# Patient Record
Sex: Female | Born: 1960
Health system: Southern US, Community
[De-identification: ages and names within clinical notes are randomized; demographics above are authoritative.]

## PROBLEM LIST (undated history)

## (undated) DIAGNOSIS — Z9221 Personal history of antineoplastic chemotherapy: Secondary | ICD-10-CM

## (undated) DIAGNOSIS — F32A Depression, unspecified: Secondary | ICD-10-CM

## (undated) DIAGNOSIS — Z9289 Personal history of other medical treatment: Secondary | ICD-10-CM

## (undated) DIAGNOSIS — G4730 Sleep apnea, unspecified: Secondary | ICD-10-CM

## (undated) DIAGNOSIS — T8859XA Other complications of anesthesia, initial encounter: Secondary | ICD-10-CM

## (undated) DIAGNOSIS — Z923 Personal history of irradiation: Secondary | ICD-10-CM

## (undated) DIAGNOSIS — Z973 Presence of spectacles and contact lenses: Secondary | ICD-10-CM

## (undated) DIAGNOSIS — G709 Myoneural disorder, unspecified: Secondary | ICD-10-CM

## (undated) DIAGNOSIS — E039 Hypothyroidism, unspecified: Secondary | ICD-10-CM

## (undated) DIAGNOSIS — I10 Essential (primary) hypertension: Secondary | ICD-10-CM

## (undated) DIAGNOSIS — Z9889 Other specified postprocedural states: Secondary | ICD-10-CM

## (undated) DIAGNOSIS — R12 Heartburn: Secondary | ICD-10-CM

## (undated) DIAGNOSIS — M1990 Unspecified osteoarthritis, unspecified site: Secondary | ICD-10-CM

## (undated) DIAGNOSIS — D649 Anemia, unspecified: Secondary | ICD-10-CM

## (undated) DIAGNOSIS — C801 Malignant (primary) neoplasm, unspecified: Secondary | ICD-10-CM

## (undated) DIAGNOSIS — F329 Major depressive disorder, single episode, unspecified: Secondary | ICD-10-CM

## (undated) DIAGNOSIS — C50919 Malignant neoplasm of unspecified site of unspecified female breast: Secondary | ICD-10-CM

## (undated) DIAGNOSIS — G35 Multiple sclerosis: Secondary | ICD-10-CM

## (undated) DIAGNOSIS — K589 Irritable bowel syndrome without diarrhea: Secondary | ICD-10-CM

## (undated) DIAGNOSIS — K219 Gastro-esophageal reflux disease without esophagitis: Secondary | ICD-10-CM

## (undated) DIAGNOSIS — F419 Anxiety disorder, unspecified: Secondary | ICD-10-CM

## (undated) HISTORY — PX: ESOPHAGOGASTRODUODENOSCOPY: SHX1529

## (undated) HISTORY — DX: Multiple sclerosis: G35

## (undated) HISTORY — PX: COLONOSCOPY: SHX5424

## (undated) HISTORY — DX: Depression, unspecified: F32.A

## (undated) HISTORY — DX: Personal history of other medical treatment: Z92.89

## (undated) HISTORY — PX: TONSILLECTOMY: SUR1361

## (undated) HISTORY — DX: Heartburn: R12

## (undated) HISTORY — PX: MOHS SURGERY: SUR867

## (undated) HISTORY — PX: TUMOR REMOVAL: SHX12

## (undated) HISTORY — DX: Major depressive disorder, single episode, unspecified: F32.9

## (undated) HISTORY — DX: Malignant neoplasm of unspecified site of unspecified female breast: C50.919

## (undated) HISTORY — PX: MASTECTOMY: SHX3

## (undated) HISTORY — PX: NO PAST SURGERIES: SHX2092

## (undated) HISTORY — DX: Other specified postprocedural states: Z98.890

## (undated) HISTORY — DX: Malignant (primary) neoplasm, unspecified: C80.1

## (undated) HISTORY — DX: Personal history of irradiation: Z92.3

## (undated) HISTORY — PX: FLEXIBLE SIGMOIDOSCOPY: SHX1649

## (undated) HISTORY — DX: Presence of spectacles and contact lenses: Z97.3

## (undated) HISTORY — DX: Irritable bowel syndrome, unspecified: K58.9

---

## 1982-06-20 HISTORY — PX: KNEE ARTHROSCOPY: SUR90

## 1997-10-10 ENCOUNTER — Other Ambulatory Visit: Admission: RE | Admit: 1997-10-10 | Discharge: 1997-10-10 | Payer: Self-pay | Admitting: Obstetrics and Gynecology

## 1998-12-24 ENCOUNTER — Other Ambulatory Visit: Admission: RE | Admit: 1998-12-24 | Discharge: 1998-12-24 | Payer: Self-pay | Admitting: Obstetrics and Gynecology

## 2000-08-18 ENCOUNTER — Other Ambulatory Visit: Admission: RE | Admit: 2000-08-18 | Discharge: 2000-08-18 | Payer: Self-pay | Admitting: Obstetrics and Gynecology

## 2002-03-05 ENCOUNTER — Other Ambulatory Visit: Admission: RE | Admit: 2002-03-05 | Discharge: 2002-03-05 | Payer: Self-pay | Admitting: Obstetrics and Gynecology

## 2002-05-14 ENCOUNTER — Ambulatory Visit (HOSPITAL_COMMUNITY): Admission: RE | Admit: 2002-05-14 | Discharge: 2002-05-14 | Payer: Self-pay | Admitting: Obstetrics and Gynecology

## 2003-04-02 ENCOUNTER — Other Ambulatory Visit: Admission: RE | Admit: 2003-04-02 | Discharge: 2003-04-02 | Payer: Self-pay | Admitting: Obstetrics and Gynecology

## 2003-10-08 ENCOUNTER — Encounter: Admission: RE | Admit: 2003-10-08 | Discharge: 2003-10-08 | Payer: Self-pay | Admitting: Internal Medicine

## 2003-11-07 ENCOUNTER — Ambulatory Visit (HOSPITAL_COMMUNITY): Admission: RE | Admit: 2003-11-07 | Discharge: 2003-11-07 | Payer: Self-pay | Admitting: Internal Medicine

## 2004-07-05 ENCOUNTER — Other Ambulatory Visit: Admission: RE | Admit: 2004-07-05 | Discharge: 2004-07-05 | Payer: Self-pay | Admitting: Obstetrics and Gynecology

## 2004-12-07 ENCOUNTER — Ambulatory Visit: Payer: Self-pay | Admitting: Gastroenterology

## 2005-01-11 DIAGNOSIS — C4491 Basal cell carcinoma of skin, unspecified: Secondary | ICD-10-CM

## 2005-01-11 DIAGNOSIS — C4492 Squamous cell carcinoma of skin, unspecified: Secondary | ICD-10-CM

## 2005-01-11 HISTORY — DX: Basal cell carcinoma of skin, unspecified: C44.91

## 2005-01-11 HISTORY — DX: Squamous cell carcinoma of skin, unspecified: C44.92

## 2005-06-20 LAB — HM COLONOSCOPY

## 2005-09-12 ENCOUNTER — Other Ambulatory Visit: Admission: RE | Admit: 2005-09-12 | Discharge: 2005-09-12 | Payer: Self-pay | Admitting: Obstetrics and Gynecology

## 2006-08-17 ENCOUNTER — Ambulatory Visit (HOSPITAL_COMMUNITY): Admission: RE | Admit: 2006-08-17 | Discharge: 2006-08-17 | Payer: Self-pay | Admitting: Obstetrics and Gynecology

## 2011-03-21 DIAGNOSIS — Z9289 Personal history of other medical treatment: Secondary | ICD-10-CM

## 2011-03-21 HISTORY — DX: Personal history of other medical treatment: Z92.89

## 2011-07-15 ENCOUNTER — Other Ambulatory Visit: Payer: Self-pay | Admitting: Orthopedic Surgery

## 2011-07-22 HISTORY — PX: JOINT REPLACEMENT: SHX530

## 2011-08-09 ENCOUNTER — Encounter (HOSPITAL_COMMUNITY): Payer: Self-pay | Admitting: Respiratory Therapy

## 2011-08-10 ENCOUNTER — Other Ambulatory Visit (HOSPITAL_COMMUNITY): Payer: Self-pay

## 2011-08-11 ENCOUNTER — Encounter (HOSPITAL_COMMUNITY)
Admission: RE | Admit: 2011-08-11 | Discharge: 2011-08-11 | Disposition: A | Discharge status: Home / Self Care | Payer: BC Managed Care – PPO | Source: Ambulatory Visit | Attending: Orthopedic Surgery | Admitting: Orthopedic Surgery

## 2011-08-11 ENCOUNTER — Encounter (HOSPITAL_COMMUNITY): Payer: Self-pay

## 2011-08-11 ENCOUNTER — Other Ambulatory Visit: Payer: Self-pay

## 2011-08-11 ENCOUNTER — Ambulatory Visit (HOSPITAL_COMMUNITY)
Admission: RE | Admit: 2011-08-11 | Discharge: 2011-08-11 | Disposition: A | Discharge status: Home / Self Care | Payer: BC Managed Care – PPO | Source: Ambulatory Visit | Attending: Orthopedic Surgery | Admitting: Orthopedic Surgery

## 2011-08-11 DIAGNOSIS — Z01818 Encounter for other preprocedural examination: Secondary | ICD-10-CM | POA: Insufficient documentation

## 2011-08-11 DIAGNOSIS — Z01812 Encounter for preprocedural laboratory examination: Secondary | ICD-10-CM | POA: Insufficient documentation

## 2011-08-11 HISTORY — DX: Myoneural disorder, unspecified: G70.9

## 2011-08-11 HISTORY — DX: Sleep apnea, unspecified: G47.30

## 2011-08-11 HISTORY — DX: Hypothyroidism, unspecified: E03.9

## 2011-08-11 HISTORY — DX: Unspecified osteoarthritis, unspecified site: M19.90

## 2011-08-11 HISTORY — DX: Essential (primary) hypertension: I10

## 2011-08-11 LAB — APTT: aPTT: 32 seconds (ref 24–37)

## 2011-08-11 LAB — COMPREHENSIVE METABOLIC PANEL
ALT: 19 U/L (ref 0–35)
AST: 20 U/L (ref 0–37)
Albumin: 3.7 g/dL (ref 3.5–5.2)
Alkaline Phosphatase: 146 U/L — ABNORMAL HIGH (ref 39–117)
BUN: 13 mg/dL (ref 6–23)
CO2: 28 mEq/L (ref 19–32)
Calcium: 9.6 mg/dL (ref 8.4–10.5)
Chloride: 99 mEq/L (ref 96–112)
Creatinine, Ser: 0.94 mg/dL (ref 0.50–1.10)
GFR calc Af Amer: 79 mL/min — ABNORMAL LOW (ref >90)
GFR calc non Af Amer: 69 mL/min — ABNORMAL LOW (ref >90)
Glucose, Bld: 105 mg/dL — ABNORMAL HIGH (ref 70–99)
Potassium: 3 mEq/L — ABNORMAL LOW (ref 3.5–5.1)
Sodium: 138 mEq/L (ref 135–145)
Total Bilirubin: 0.6 mg/dL (ref 0.3–1.2)
Total Protein: 7.3 g/dL (ref 6.0–8.3)

## 2011-08-11 LAB — URINALYSIS, ROUTINE W REFLEX MICROSCOPIC
Bilirubin Urine: NEGATIVE
Glucose, UA: NEGATIVE mg/dL
Hgb urine dipstick: NEGATIVE
Ketones, ur: NEGATIVE mg/dL
Leukocytes, UA: NEGATIVE
Nitrite: NEGATIVE
Protein, ur: NEGATIVE mg/dL
Specific Gravity, Urine: 1.007 (ref 1.005–1.030)
Urobilinogen, UA: 0.2 mg/dL (ref 0.0–1.0)
pH: 6 (ref 5.0–8.0)

## 2011-08-11 LAB — CBC
HCT: 40.7 % (ref 36.0–46.0)
Hemoglobin: 13.7 g/dL (ref 12.0–15.0)
MCH: 28.1 pg (ref 26.0–34.0)
MCHC: 33.7 g/dL (ref 30.0–36.0)
MCV: 83.4 fL (ref 78.0–100.0)
Platelets: 265 10*3/uL (ref 150–400)
RBC: 4.88 MIL/uL (ref 3.87–5.11)
RDW: 13.4 % (ref 11.5–15.5)
WBC: 8.4 10*3/uL (ref 4.0–10.5)

## 2011-08-11 LAB — DIFFERENTIAL
Basophils Absolute: 0 10*3/uL (ref 0.0–0.1)
Basophils Relative: 1 % (ref 0–1)
Eosinophils Absolute: 0.2 10*3/uL (ref 0.0–0.7)
Eosinophils Relative: 3 % (ref 0–5)
Lymphocytes Relative: 28 % (ref 12–46)
Lymphs Abs: 2.3 10*3/uL (ref 0.7–4.0)
Monocytes Absolute: 0.6 10*3/uL (ref 0.1–1.0)
Monocytes Relative: 7 % (ref 3–12)
Neutro Abs: 5.2 10*3/uL (ref 1.7–7.7)
Neutrophils Relative %: 62 % (ref 43–77)

## 2011-08-11 LAB — TYPE AND SCREEN
ABO/RH(D): O POS
Antibody Screen: NEGATIVE

## 2011-08-11 LAB — PROTIME-INR
INR: 0.91 (ref 0.00–1.49)
Prothrombin Time: 12.5 seconds (ref 11.6–15.2)

## 2011-08-11 LAB — ABO/RH: ABO/RH(D): O POS

## 2011-08-11 LAB — SURGICAL PCR SCREEN
MRSA, PCR: NEGATIVE
Staphylococcus aureus: POSITIVE — AB

## 2011-08-11 LAB — HCG, SERUM, QUALITATIVE: Preg, Serum: NEGATIVE

## 2011-08-11 NOTE — Progress Notes (Signed)
REQUESTED STRESS TEST DONE 1.5 YRS AGO  AND SLEEP STUDY 6 YRS AGO FROM [facility] (PATIENT DOES NOT HAVE CPAP).

## 2011-08-11 NOTE — Pre-Procedure Instructions (Signed)
20 Julie Ross  08/11/2011   Your procedure is scheduled on:  Monday 08/15/11   Report to [name] at 1000 AM.  Call this number if you have problems the morning of surgery: [phone]   Remember:   Do not eat food:After Midnight.  May have clear liquids: up to 4 Hours before arrival.  Clear liquids include soda, tea, black coffee, apple or grape juice, broth.  Take these medicines the morning of surgery with A SIP OF WATER: CYMBALTA, NEXIUM, NEURONTIN, SYNTHROID, ULTRAM   Do not wear jewelry, make-up or nail polish.  Do not wear lotions, powders, or perfumes. You may wear deodorant.  Do not shave 48 hours prior to surgery.  Do not bring valuables to the hospital.  Contacts, dentures or bridgework may not be worn into surgery.  Leave suitcase in the car. After surgery it may be brought to your room.  For patients admitted to the hospital, checkout time is 11:00 AM the day of discharge.   Patients discharged the day of surgery will not be allowed to drive home.  Name and phone number of your driver:   Special Instructions: CHG Shower Use Special Wash: 1/2 bottle night before surgery and 1/2 bottle morning of surgery.   Please read over the following fact sheets that you were given: Pain Booklet, Total Joint Packet, MRSA Information and Surgical Site Infection Prevention

## 2011-08-12 ENCOUNTER — Encounter (HOSPITAL_COMMUNITY): Payer: Self-pay | Admitting: Vascular Surgery

## 2011-08-12 NOTE — Consult Note (Signed)
Anesthesia:  Patient is a 51 year old female scheduled for a left TKA on 08/15/11.  History includes HTN, MS, hypothyroidism, OSA not on CPAP, non-smoker.  EKG shows NSR with non-specific ST abnormality.  She had a negative Lexiscan stress test, EF 81% on 11/26/09 at [facility].  She also had an echocardiogram done then showing structurally normal MV with trace insufficiency, no prolapse, structurally normal AV with good leaflet mobility and no regurgitation, normal LA, normal LV size and systolic function with EF 65% with no segmental abnormalities, no pericardial effusion.  CXR shows no active disease.   Labs noted.  K+ 3.0--acceptable per Anesthesia guidelines.  Plan to proceed.

## 2011-08-14 MED ORDER — CEFAZOLIN SODIUM-DEXTROSE 2-3 GM-% IV SOLR
2.0000 g | INTRAVENOUS | Status: AC
  Administered 2011-08-15: 2 g via INTRAVENOUS
  Filled 2011-08-14: qty 50

## 2011-08-15 ENCOUNTER — Encounter (HOSPITAL_COMMUNITY): Payer: Self-pay | Admitting: Vascular Surgery

## 2011-08-15 ENCOUNTER — Ambulatory Visit (HOSPITAL_COMMUNITY): Payer: BC Managed Care – PPO | Admitting: Vascular Surgery

## 2011-08-15 ENCOUNTER — Encounter (HOSPITAL_COMMUNITY)
Admission: RE | Disposition: A | Discharge status: Home Health | Payer: Self-pay | Source: Ambulatory Visit | Attending: Orthopedic Surgery

## 2011-08-15 ENCOUNTER — Inpatient Hospital Stay (HOSPITAL_COMMUNITY)
Admission: RE | Admit: 2011-08-15 | Discharge: 2011-08-18 | DRG: 209 | Disposition: A | Discharge status: Home Health | Payer: BC Managed Care – PPO | Source: Ambulatory Visit | Attending: Orthopedic Surgery | Admitting: Orthopedic Surgery

## 2011-08-15 ENCOUNTER — Encounter (HOSPITAL_COMMUNITY): Payer: Self-pay | Admitting: *Deleted

## 2011-08-15 DIAGNOSIS — G35 Multiple sclerosis: Secondary | ICD-10-CM | POA: Diagnosis present

## 2011-08-15 DIAGNOSIS — E039 Hypothyroidism, unspecified: Secondary | ICD-10-CM | POA: Diagnosis present

## 2011-08-15 DIAGNOSIS — M129 Arthropathy, unspecified: Secondary | ICD-10-CM | POA: Diagnosis present

## 2011-08-15 DIAGNOSIS — I10 Essential (primary) hypertension: Secondary | ICD-10-CM | POA: Diagnosis present

## 2011-08-15 DIAGNOSIS — M1710 Unilateral primary osteoarthritis, unspecified knee: Principal | ICD-10-CM | POA: Diagnosis present

## 2011-08-15 DIAGNOSIS — G4730 Sleep apnea, unspecified: Secondary | ICD-10-CM | POA: Diagnosis present

## 2011-08-15 DIAGNOSIS — M1712 Unilateral primary osteoarthritis, left knee: Secondary | ICD-10-CM | POA: Diagnosis present

## 2011-08-15 DIAGNOSIS — Z79899 Other long term (current) drug therapy: Secondary | ICD-10-CM

## 2011-08-15 DIAGNOSIS — M170 Bilateral primary osteoarthritis of knee: Secondary | ICD-10-CM | POA: Diagnosis present

## 2011-08-15 HISTORY — PX: TOTAL KNEE ARTHROPLASTY: SHX125

## 2011-08-15 SURGERY — ARTHROPLASTY, KNEE, TOTAL
Anesthesia: Regional | Site: Knee | Laterality: Left | Wound class: Clean

## 2011-08-15 MED ORDER — BUPIVACAINE HCL (PF) 0.5 % IJ SOLN
INTRAMUSCULAR | Status: DC | PRN
  Administered 2011-08-15: 8 mL via INTRA_ARTICULAR

## 2011-08-15 MED ORDER — NEOSTIGMINE METHYLSULFATE 1 MG/ML IJ SOLN
INTRAMUSCULAR | Status: DC | PRN
  Administered 2011-08-15: 4 mg via INTRAVENOUS

## 2011-08-15 MED ORDER — NALOXONE HCL 0.4 MG/ML IJ SOLN: 0.4000 mg | INTRAMUSCULAR | Status: DC | PRN

## 2011-08-15 MED ORDER — ONDANSETRON HCL 4 MG/2ML IJ SOLN
4.0000 mg | Freq: Four times a day (QID) | INTRAMUSCULAR | Status: DC | PRN
  Administered 2011-08-16 (×2): 4 mg via INTRAVENOUS
  Filled 2011-08-15: qty 2

## 2011-08-15 MED ORDER — WARFARIN VIDEO
Freq: Once | Status: AC
  Administered 2011-08-16: 12:00:00

## 2011-08-15 MED ORDER — KETOROLAC TROMETHAMINE 30 MG/ML IJ SOLN
30.0000 mg | Freq: Three times a day (TID) | INTRAMUSCULAR | Status: AC
  Administered 2011-08-15 – 2011-08-18 (×8): 30 mg via INTRAVENOUS
  Filled 2011-08-15 (×8): qty 1

## 2011-08-15 MED ORDER — ACETAMINOPHEN 10 MG/ML IV SOLN
1000.0000 mg | Freq: Four times a day (QID) | INTRAVENOUS | Status: AC
  Administered 2011-08-15 – 2011-08-16 (×3): 1000 mg via INTRAVENOUS
  Filled 2011-08-15 (×3): qty 100

## 2011-08-15 MED ORDER — DIPHENHYDRAMINE HCL 12.5 MG/5ML PO ELIX: 12.5000 mg | ORAL_SOLUTION | Freq: Four times a day (QID) | ORAL | Status: DC | PRN

## 2011-08-15 MED ORDER — PHENYLEPHRINE HCL 10 MG/ML IJ SOLN
INTRAMUSCULAR | Status: DC | PRN
  Administered 2011-08-15 (×5): 40 ug via INTRAVENOUS
  Administered 2011-08-15: 80 ug via INTRAVENOUS

## 2011-08-15 MED ORDER — MIDAZOLAM HCL 2 MG/2ML IJ SOLN
INTRAMUSCULAR | Status: AC
  Filled 2011-08-15: qty 2

## 2011-08-15 MED ORDER — ONDANSETRON HCL 4 MG/2ML IJ SOLN: 4.0000 mg | Freq: Once | INTRAMUSCULAR | Status: DC | PRN

## 2011-08-15 MED ORDER — ROCURONIUM BROMIDE 100 MG/10ML IV SOLN
INTRAVENOUS | Status: DC | PRN
  Administered 2011-08-15: 50 mg via INTRAVENOUS

## 2011-08-15 MED ORDER — MIDAZOLAM HCL 2 MG/2ML IJ SOLN
1.0000 mg | INTRAMUSCULAR | Status: DC | PRN
  Administered 2011-08-15: 2 mg via INTRAVENOUS

## 2011-08-15 MED ORDER — LOSARTAN POTASSIUM-HCTZ 50-12.5 MG PO TABS: 1.0000 | ORAL_TABLET | Freq: Every day | ORAL | Status: DC

## 2011-08-15 MED ORDER — METHOCARBAMOL 500 MG PO TABS
500.0000 mg | ORAL_TABLET | Freq: Four times a day (QID) | ORAL | Status: DC | PRN
  Administered 2011-08-16 (×2): 500 mg via ORAL
  Filled 2011-08-15 (×3): qty 1

## 2011-08-15 MED ORDER — ESMOLOL HCL 10 MG/ML IV SOLN
INTRAVENOUS | Status: DC | PRN
  Administered 2011-08-15: 20 mg via INTRAVENOUS

## 2011-08-15 MED ORDER — KETOROLAC TROMETHAMINE 30 MG/ML IJ SOLN
INTRAMUSCULAR | Status: DC | PRN
  Administered 2011-08-15: 30 mg via INTRAVENOUS

## 2011-08-15 MED ORDER — FENTANYL CITRATE 0.05 MG/ML IJ SOLN
INTRAMUSCULAR | Status: DC | PRN
  Administered 2011-08-15: 50 ug via INTRAVENOUS
  Administered 2011-08-15: 25 ug via INTRAVENOUS
  Administered 2011-08-15 (×2): 50 ug via INTRAVENOUS
  Administered 2011-08-15: 25 ug via INTRAVENOUS
  Administered 2011-08-15: 50 ug via INTRAVENOUS
  Administered 2011-08-15: 100 ug via INTRAVENOUS

## 2011-08-15 MED ORDER — LEVOTHYROXINE SODIUM 25 MCG PO TABS
25.0000 ug | ORAL_TABLET | Freq: Every day | ORAL | Status: DC
  Administered 2011-08-16 – 2011-08-18 (×3): 25 ug via ORAL
  Filled 2011-08-15 (×3): qty 1

## 2011-08-15 MED ORDER — METHOCARBAMOL 100 MG/ML IJ SOLN
500.0000 mg | Freq: Four times a day (QID) | INTRAVENOUS | Status: DC | PRN
  Administered 2011-08-15: 500 mg via INTRAVENOUS
  Filled 2011-08-15: qty 5

## 2011-08-15 MED ORDER — ZOLPIDEM TARTRATE 5 MG PO TABS: 5.0000 mg | ORAL_TABLET | Freq: Every evening | ORAL | Status: DC | PRN

## 2011-08-15 MED ORDER — PANTOPRAZOLE SODIUM 40 MG PO TBEC
80.0000 mg | DELAYED_RELEASE_TABLET | Freq: Every day | ORAL | Status: DC
  Administered 2011-08-15 – 2011-08-18 (×4): 80 mg via ORAL
  Filled 2011-08-15 (×2): qty 2
  Filled 2011-08-15 (×2): qty 1
  Filled 2011-08-15: qty 2

## 2011-08-15 MED ORDER — PROPOFOL 10 MG/ML IV EMUL
INTRAVENOUS | Status: DC | PRN
  Administered 2011-08-15: 120 mg via INTRAVENOUS

## 2011-08-15 MED ORDER — OXYCODONE HCL 5 MG PO TABS: 5.0000 mg | ORAL_TABLET | ORAL | Status: DC | PRN

## 2011-08-15 MED ORDER — ONDANSETRON HCL 4 MG/2ML IJ SOLN
4.0000 mg | Freq: Four times a day (QID) | INTRAMUSCULAR | Status: DC | PRN
  Filled 2011-08-15: qty 2

## 2011-08-15 MED ORDER — ALUM & MAG HYDROXIDE-SIMETH 200-200-20 MG/5ML PO SUSP
30.0000 mL | ORAL | Status: DC | PRN
  Administered 2011-08-17: 30 mL via ORAL
  Filled 2011-08-15: qty 30

## 2011-08-15 MED ORDER — LACTATED RINGERS IV SOLN
INTRAVENOUS | Status: DC | PRN
  Administered 2011-08-15 (×3): via INTRAVENOUS

## 2011-08-15 MED ORDER — DEXTROSE-NACL 5-0.45 % IV SOLN
INTRAVENOUS | Status: DC
  Administered 2011-08-15 – 2011-08-16 (×2): via INTRAVENOUS

## 2011-08-15 MED ORDER — GABAPENTIN 300 MG PO CAPS
300.0000 mg | ORAL_CAPSULE | Freq: Three times a day (TID) | ORAL | Status: DC
  Administered 2011-08-15 – 2011-08-18 (×9): 300 mg via ORAL
  Filled 2011-08-15 (×11): qty 1

## 2011-08-15 MED ORDER — DULOXETINE HCL 60 MG PO CPEP
60.0000 mg | ORAL_CAPSULE | Freq: Every day | ORAL | Status: DC
  Administered 2011-08-16 – 2011-08-18 (×3): 60 mg via ORAL
  Filled 2011-08-15 (×3): qty 1

## 2011-08-15 MED ORDER — PROMETHAZINE HCL 25 MG/ML IJ SOLN: 12.5000 mg | Freq: Four times a day (QID) | INTRAMUSCULAR | Status: DC | PRN

## 2011-08-15 MED ORDER — ONDANSETRON HCL 4 MG PO TABS
4.0000 mg | ORAL_TABLET | Freq: Four times a day (QID) | ORAL | Status: DC | PRN
  Administered 2011-08-18: 4 mg via ORAL
  Filled 2011-08-15: qty 1

## 2011-08-15 MED ORDER — ONDANSETRON HCL 4 MG/2ML IJ SOLN
INTRAMUSCULAR | Status: DC | PRN
  Administered 2011-08-15: 4 mg via INTRAVENOUS

## 2011-08-15 MED ORDER — HYDROCHLOROTHIAZIDE 12.5 MG PO CAPS
12.5000 mg | ORAL_CAPSULE | Freq: Every day | ORAL | Status: DC
  Administered 2011-08-15 – 2011-08-18 (×4): 12.5 mg via ORAL
  Filled 2011-08-15 (×4): qty 1

## 2011-08-15 MED ORDER — METHYLPREDNISOLONE ACETATE PF 80 MG/ML IJ SUSP
INTRAMUSCULAR | Status: DC | PRN
  Administered 2011-08-15: 2 mL via INTRA_ARTICULAR

## 2011-08-15 MED ORDER — FENTANYL CITRATE 0.05 MG/ML IJ SOLN
INTRAMUSCULAR | Status: AC
  Filled 2011-08-15: qty 2

## 2011-08-15 MED ORDER — FERROUS SULFATE 325 (65 FE) MG PO TABS
325.0000 mg | ORAL_TABLET | Freq: Two times a day (BID) | ORAL | Status: DC
  Administered 2011-08-15 – 2011-08-18 (×6): 325 mg via ORAL
  Filled 2011-08-15 (×8): qty 1

## 2011-08-15 MED ORDER — HYDROMORPHONE HCL PF 1 MG/ML IJ SOLN
0.2500 mg | INTRAMUSCULAR | Status: DC | PRN
  Administered 2011-08-15 (×3): 0.5 mg via INTRAVENOUS

## 2011-08-15 MED ORDER — HYDROMORPHONE HCL PF 1 MG/ML IJ SOLN
INTRAMUSCULAR | Status: AC
  Filled 2011-08-15: qty 1

## 2011-08-15 MED ORDER — MORPHINE SULFATE (PF) 1 MG/ML IV SOLN
INTRAVENOUS | Status: AC
  Administered 2011-08-15: 22:00:00
  Filled 2011-08-15: qty 25

## 2011-08-15 MED ORDER — SODIUM CHLORIDE 0.9 % IR SOLN
Status: DC | PRN
  Administered 2011-08-15: 1000 mL

## 2011-08-15 MED ORDER — BACLOFEN 10 MG PO TABS
10.0000 mg | ORAL_TABLET | Freq: Three times a day (TID) | ORAL | Status: DC
  Administered 2011-08-15 – 2011-08-18 (×9): 10 mg via ORAL
  Filled 2011-08-15 (×11): qty 1

## 2011-08-15 MED ORDER — GLYCOPYRROLATE 0.2 MG/ML IJ SOLN
INTRAMUSCULAR | Status: DC | PRN
  Administered 2011-08-15: .6 mg via INTRAVENOUS

## 2011-08-15 MED ORDER — COUMADIN BOOK
Freq: Once | Status: AC
  Administered 2011-08-15: 18:00:00
  Filled 2011-08-15: qty 1

## 2011-08-15 MED ORDER — POVIDONE-IODINE 7.5 % EX SOLN
Freq: Once | CUTANEOUS | Status: DC
  Filled 2011-08-15: qty 118

## 2011-08-15 MED ORDER — CEFAZOLIN SODIUM-DEXTROSE 2-3 GM-% IV SOLR
2.0000 g | Freq: Four times a day (QID) | INTRAVENOUS | Status: AC
  Administered 2011-08-15 – 2011-08-16 (×3): 2 g via INTRAVENOUS
  Filled 2011-08-15 (×3): qty 50

## 2011-08-15 MED ORDER — MORPHINE SULFATE 2 MG/ML IJ SOLN: 0.0500 mg/kg | INTRAMUSCULAR | Status: DC | PRN

## 2011-08-15 MED ORDER — CLONAZEPAM 0.5 MG PO TABS: 0.5000 mg | ORAL_TABLET | Freq: Every evening | ORAL | Status: DC | PRN

## 2011-08-15 MED ORDER — DIPHENHYDRAMINE HCL 50 MG/ML IJ SOLN: 12.5000 mg | Freq: Four times a day (QID) | INTRAMUSCULAR | Status: DC | PRN

## 2011-08-15 MED ORDER — FENTANYL CITRATE 0.05 MG/ML IJ SOLN
50.0000 ug | INTRAMUSCULAR | Status: DC | PRN
  Administered 2011-08-15: 100 ug via INTRAVENOUS

## 2011-08-15 MED ORDER — MEPERIDINE HCL 25 MG/ML IJ SOLN: 6.2500 mg | INTRAMUSCULAR | Status: DC | PRN

## 2011-08-15 MED ORDER — SODIUM CHLORIDE 0.9 % IJ SOLN: 9.0000 mL | INTRAMUSCULAR | Status: DC | PRN

## 2011-08-15 MED ORDER — MORPHINE SULFATE (PF) 1 MG/ML IV SOLN
INTRAVENOUS | Status: DC
  Administered 2011-08-15: 18 mg via INTRAVENOUS
  Administered 2011-08-15: 16:00:00 via INTRAVENOUS
  Administered 2011-08-16: 7.38 mg via INTRAVENOUS
  Administered 2011-08-16: 16.5 mg via INTRAVENOUS
  Administered 2011-08-16: 21:00:00 via INTRAVENOUS
  Administered 2011-08-16: 12 mg via INTRAVENOUS
  Administered 2011-08-16: 12:00:00 via INTRAVENOUS
  Administered 2011-08-16: 22.13 mg via INTRAVENOUS
  Administered 2011-08-17: 4.5 mg via INTRAVENOUS

## 2011-08-15 MED ORDER — LACTATED RINGERS IV SOLN
INTRAVENOUS | Status: DC
  Administered 2011-08-15: 12:00:00 via INTRAVENOUS

## 2011-08-15 MED ORDER — LOSARTAN POTASSIUM 50 MG PO TABS
50.0000 mg | ORAL_TABLET | Freq: Every day | ORAL | Status: DC
  Administered 2011-08-15 – 2011-08-18 (×4): 50 mg via ORAL
  Filled 2011-08-15 (×4): qty 1

## 2011-08-15 MED ORDER — DEXAMETHASONE SODIUM PHOSPHATE 4 MG/ML IJ SOLN
INTRAMUSCULAR | Status: DC | PRN
  Administered 2011-08-15: 4 mg via INTRAVENOUS

## 2011-08-15 MED ORDER — MORPHINE SULFATE (PF) 1 MG/ML IV SOLN
INTRAVENOUS | Status: AC
  Filled 2011-08-15: qty 25

## 2011-08-15 MED ORDER — WARFARIN SODIUM 7.5 MG PO TABS
7.5000 mg | ORAL_TABLET | Freq: Once | ORAL | Status: AC
  Administered 2011-08-15: 7.5 mg via ORAL
  Filled 2011-08-15: qty 1

## 2011-08-15 MED ORDER — ACETAMINOPHEN 10 MG/ML IV SOLN
INTRAVENOUS | Status: DC | PRN
  Administered 2011-08-15: 1000 mg via INTRAVENOUS

## 2011-08-15 SURGICAL SUPPLY — 71 items
APL SKNCLS STERI-STRIP NONHPOA (GAUZE/BANDAGES/DRESSINGS)
BANDAGE ESMARK 6X9 LF (GAUZE/BANDAGES/DRESSINGS) ×1 IMPLANT
BENZOIN TINCTURE PRP APPL 2/3 (GAUZE/BANDAGES/DRESSINGS) ×1 IMPLANT
BLADE SAGITTAL 25.0X1.19X90 (BLADE) ×2 IMPLANT
BLADE SAW SAG 90X13X1.27 (BLADE) ×2 IMPLANT
BNDG CMPR 9X6 STRL LF SNTH (GAUZE/BANDAGES/DRESSINGS) ×1
BNDG ESMARK 6X9 LF (GAUZE/BANDAGES/DRESSINGS) ×2
BOWL SMART MIX CTS (DISPOSABLE) ×2 IMPLANT
CEMENT HV SMART SET (Cement) ×4 IMPLANT
CLOSURE STERI STRIP 1/2 X4 (GAUZE/BANDAGES/DRESSINGS) ×1 IMPLANT
CLOTH BEACON ORANGE TIMEOUT ST (SAFETY) ×2 IMPLANT
COVER BACK TABLE 24X17X13 BIG (DRAPES) IMPLANT
COVER SURGICAL LIGHT HANDLE (MISCELLANEOUS) ×2 IMPLANT
CUFF TOURNIQUET SINGLE 34IN LL (TOURNIQUET CUFF) ×2 IMPLANT
CUFF TOURNIQUET SINGLE 44IN (TOURNIQUET CUFF) IMPLANT
DRAPE EXTREMITY T 121X128X90 (DRAPE) ×2 IMPLANT
DRAPE U-SHAPE 47X51 STRL (DRAPES) ×2 IMPLANT
DRSG MEPILEX BORDER 4X12 (GAUZE/BANDAGES/DRESSINGS) ×1 IMPLANT
DRSG PAD ABDOMINAL 8X10 ST (GAUZE/BANDAGES/DRESSINGS) ×1 IMPLANT
DURAPREP 26ML APPLICATOR (WOUND CARE) ×2 IMPLANT
ELECT REM PT RETURN 9FT ADLT (ELECTROSURGICAL) ×2
ELECTRODE REM PT RTRN 9FT ADLT (ELECTROSURGICAL) ×1 IMPLANT
EVACUATOR 1/8 PVC DRAIN (DRAIN) ×2 IMPLANT
FACESHIELD LNG OPTICON STERILE (SAFETY) ×2 IMPLANT
GAUZE XEROFORM 5X9 LF (GAUZE/BANDAGES/DRESSINGS) ×1 IMPLANT
GLOVE BIOGEL PI IND STRL 8 (GLOVE) ×2 IMPLANT
GLOVE BIOGEL PI INDICATOR 8 (GLOVE) ×3
GLOVE ECLIPSE 7.5 STRL STRAW (GLOVE) ×4 IMPLANT
GLOVE SURG SS PI 7.5 STRL IVOR (GLOVE) ×1 IMPLANT
GOWN BRE IMP PREV XXLGXLNG (GOWN DISPOSABLE) ×1 IMPLANT
GOWN PREVENTION PLUS XLARGE (GOWN DISPOSABLE) ×2 IMPLANT
GOWN SRG XL XLNG 56XLVL 4 (GOWN DISPOSABLE) ×1 IMPLANT
GOWN STRL NON-REIN LRG LVL3 (GOWN DISPOSABLE) ×2 IMPLANT
GOWN STRL NON-REIN XL XLG LVL4 (GOWN DISPOSABLE) ×2
GOWN STRL REIN XL XLG (GOWN DISPOSABLE) ×1 IMPLANT
HANDPIECE INTERPULSE COAX TIP (DISPOSABLE) ×2
HOOD PEEL AWAY FACE SHEILD DIS (HOOD) ×6 IMPLANT
IMMOBILIZER KNEE 20 (SOFTGOODS)
IMMOBILIZER KNEE 20 THIGH 36 (SOFTGOODS) IMPLANT
IMMOBILIZER KNEE 22 UNIV (SOFTGOODS) ×2 IMPLANT
IMMOBILIZER KNEE 24 THIGH 36 (MISCELLANEOUS) IMPLANT
IMMOBILIZER KNEE 24 UNIV (MISCELLANEOUS)
KIT BASIN OR (CUSTOM PROCEDURE TRAY) ×2 IMPLANT
KIT ROOM TURNOVER OR (KITS) ×2 IMPLANT
MANIFOLD NEPTUNE II (INSTRUMENTS) ×2 IMPLANT
MARKER SPHERE PSV REFLC NDI (MISCELLANEOUS) ×3 IMPLANT
NDL 18GX1X1/2 (RX/OR ONLY) (NEEDLE) IMPLANT
NDL HYPO 25GX1X1/2 BEV (NEEDLE) IMPLANT
NEEDLE 18GX1X1/2 (RX/OR ONLY) (NEEDLE) ×2 IMPLANT
NEEDLE HYPO 25GX1X1/2 BEV (NEEDLE) ×2 IMPLANT
NS IRRIG 1000ML POUR BTL (IV SOLUTION) ×2 IMPLANT
PACK TOTAL JOINT (CUSTOM PROCEDURE TRAY) ×2 IMPLANT
PAD ARMBOARD 7.5X6 YLW CONV (MISCELLANEOUS) ×4 IMPLANT
PAD CAST 4YDX4 CTTN HI CHSV (CAST SUPPLIES) ×1 IMPLANT
PADDING CAST COTTON 4X4 STRL (CAST SUPPLIES)
SET HNDPC FAN SPRY TIP SCT (DISPOSABLE) ×1 IMPLANT
SPONGE GAUZE 4X4 12PLY (GAUZE/BANDAGES/DRESSINGS) ×1 IMPLANT
STAPLER VISISTAT 35W (STAPLE) ×1 IMPLANT
STRIP CLOSURE SKIN 1/2X4 (GAUZE/BANDAGES/DRESSINGS) ×1 IMPLANT
SUCTION FRAZIER TIP 10 FR DISP (SUCTIONS) ×2 IMPLANT
SUT MON AB 3-0 SH 27 (SUTURE)
SUT MON AB 3-0 SH27 (SUTURE) IMPLANT
SUT VIC AB 0 CTB1 27 (SUTURE) ×4 IMPLANT
SUT VIC AB 1 CT1 27 (SUTURE) ×4
SUT VIC AB 1 CT1 27XBRD ANBCTR (SUTURE) ×2 IMPLANT
SUT VIC AB 2-0 CTB1 (SUTURE) ×4 IMPLANT
SYR CONTROL 10ML LL (SYRINGE) ×1 IMPLANT
TOWEL OR 17X24 6PK STRL BLUE (TOWEL DISPOSABLE) ×2 IMPLANT
TOWEL OR 17X26 10 PK STRL BLUE (TOWEL DISPOSABLE) ×2 IMPLANT
TRAY FOLEY CATH 14FR (SET/KITS/TRAYS/PACK) ×2 IMPLANT
WATER STERILE IRR 1000ML POUR (IV SOLUTION) ×5 IMPLANT

## 2011-08-15 NOTE — Progress Notes (Signed)
Orthopedic Tech Progress Note Patient Details:  Julie Ross 1961/01/05 [id number]  Patient ID: Julie Ross, female   DOB: [birth year], 51 y.o.   MRN: [id number]   Julie Ross 08/15/2011, 7:10 PM cpm o-60 on at 1910,trapeze bar

## 2011-08-15 NOTE — Consult Note (Signed)
ANTICOAGULATION CONSULT NOTE - Initial Consult  Pharmacy Consult for Coumadin Indication: VTE prophylaxis  No Known Allergies  Patient Measurements: 93.3kg 168cm  Vital Signs: Temp: 97.6 F (36.4 C) (02/25 1630) Temp src: Oral (02/25 1029) BP: 140/63 mmHg (02/25 1630) Pulse Rate: 74  (02/25 1645)  Labs: No results found for this basename: HGB:2,HCT:3,PLT:3,APTT:3,LABPROT:3,INR:3,HEPARINUNFRC:3,CREATININE:3,CKTOTAL:3,CKMB:3,TROPONINI:3 in the last 72 hours CrCl is unknown because there is no height on file for the current visit.  Medical History: Past Medical History  Diagnosis Date  . Hypertension   . Sleep apnea     MILD SLEEP APNEA, NO MACHINE  6 YRS AGO [facility]   . Hypothyroidism   . Arthritis   . Neuromuscular disorder     MS TX WITH CYMBALTA   . Multiple sclerosis     Medications:  Prescriptions prior to admission  Medication Sig Dispense Refill  . baclofen (LIORESAL) 10 MG tablet Take 10 mg by mouth 3 (three) times daily.      Marland Kitchen CALCIUM-MAGNESIUM PO Take 1 tablet by mouth daily.      . cholecalciferol (VITAMIN D) 1000 UNITS tablet Take 4,000 Units by mouth daily.      . clonazePAM (KLONOPIN) 0.5 MG tablet Take 0.5 mg by mouth at bedtime as needed. For sleep      . DULoxetine (CYMBALTA) 60 MG capsule Take 60 mg by mouth daily.      Marland Kitchen esomeprazole (NEXIUM) 40 MG capsule Take 40 mg by mouth daily before breakfast.      . gabapentin (NEURONTIN) 300 MG capsule Take 300 mg by mouth 3 (three) times daily.      Marland Kitchen levothyroxine (SYNTHROID, LEVOTHROID) 25 MCG tablet Take 25 mcg by mouth daily.      Marland Kitchen losartan-hydrochlorothiazide (HYZAAR) 50-12.5 MG per tablet Take 1 tablet by mouth daily.      . Multiple Vitamins-Minerals (MULTIVITAMINS THER. W/MINERALS) TABS Take 1 tablet by mouth daily.      . traMADol (ULTRAM) 50 MG tablet Take 100 mg by mouth every 6 (six) hours as needed. For pain       Assessment: Julie Ross s/p left TKA to begin coumadin for VTE prophylaxis.  Baseline INR 0.91. Coumadin score=6.  Goal of Therapy:  "Shoot for INR of 2" per MD request   Plan:  1) Coumadin 7.5mg  x 1 2) Daily INR 3) Coumadin education-book/video  Fredrik Rigger 08/15/2011,5:11 PM

## 2011-08-15 NOTE — Plan of Care (Signed)
Problem: Consults Goal: Diagnosis- Total Joint Replacement Outcome: Completed/Met Date Met:  08/15/11 Primary Total Knee     

## 2011-08-15 NOTE — Brief Op Note (Cosign Needed)
08/15/2011  3:29 PM  PATIENT:  Julie Ross  51 y.o. female  PRE-OPERATIVE DIAGNOSIS:  degenerative joint disease bilateral knees  POST-OPERATIVE DIAGNOSIS:  degenerative joint disease bilateral knees  PROCEDURE: 1. Procedure(s) (LRB): TOTAL KNEE ARTHROPLASTY (Left) computer assisted 2. Cortisone injection Right knee SURGEON:  Surgeon(s) and Role:    * Harvie Junior, MD - Primary  PHYSICIAN ASSISTANT: Orma Flaming PA-C  ASSISTANTS: C Jacolyn Joaquin RNFA  ANESTHESIA:   general  EBL:  Total I/O In: 2150 [I.V.:2150] Out: 400 [Urine:250; Blood:150]  BLOOD ADMINISTERED:none  DRAINS: Hemovac x 1 COUNTS:  YES  TOURNIQUET:   Total Tourniquet Time Documented: Thigh (Left) - 79 minutes  DICTATION: .Other Dictation: Dictation Number [id number]  PLAN OF CARE: Admit to inpatient   PATIENT DISPOSITION:  PACU - hemodynamically stable.   Delay start of Pharmacological VTE agent (>24hrs) due to surgical blood loss or risk of bleeding: not applicable

## 2011-08-15 NOTE — Anesthesia Postprocedure Evaluation (Signed)
  Anesthesia Post-op Note  Patient: Julie Ross  Procedure(s) Performed: Procedure(s) (LRB): TOTAL KNEE ARTHROPLASTY (Left)  Patient Location: PACU  Anesthesia Type: GA combined with regional for post-op pain  Level of Consciousness: awake, alert  and oriented  Airway and Oxygen Therapy: Patient Spontanous Breathing and Patient connected to nasal cannula oxygen  Post-op Pain: moderate  Post-op Assessment: Post-op Vital signs reviewed, Patient's Cardiovascular Status Stable, Respiratory Function Stable, Patent Airway, No signs of Nausea or vomiting and Pain level controlled  Post-op Vital Signs: Reviewed and stable  Complications: No apparent anesthesia complications

## 2011-08-15 NOTE — Progress Notes (Signed)
Called Dr. Luiz Blare' office for clarification on what size teds hose he wanted. The order does not specify.

## 2011-08-15 NOTE — Anesthesia Procedure Notes (Signed)
Anesthesia Regional Block:  Femoral nerve block  Pre-Anesthetic Checklist: ,, timeout performed, Correct Patient, Correct Site, Correct Laterality, Correct Procedure, Correct Position, site marked, Risks and benefits discussed,  Surgical consent,  Pre-op evaluation,  At surgeon's request and post-op pain management  Laterality: Left  Prep: chloraprep       Needles:  Injection technique: Single-shot  Needle Type: Echogenic Stimulator Needle     Needle Length: 9cm  Needle Gauge: 21    Additional Needles:  Procedures: ultrasound guided and nerve stimulator Femoral nerve block  Nerve Stimulator or Paresthesia:  Response: Quadriceps muscle contraction, 0.4 mA,   Additional Responses:   Narrative:  Start time: 08/15/2011 12:15 PM End time: 08/15/2011 12:30 PM Injection made incrementally with aspirations every 5 mL.  Performed by: Personally  Anesthesiologist: Arta Bruce MD  Additional Notes: Functioning IV was confirmed and monitors were applied.  A 90mm 21ga Arrow echogenic stimulator needle was used. Sterile prep and drape,hand hygiene and sterile gloves were used.  Negative aspiration and negative test dose prior to incremental administration of local anesthetic. The patient tolerated the procedure well.    Femoral nerve block

## 2011-08-15 NOTE — H&P (Signed)
PREOPERATIVE H&P  Chief Complaint: L. Knee pain  HPI: Julie Ross is a 51 y.o. female who presents for evaluation of L. Knee pain. It has been present for greater than 1 year and has been worsening.  She has bone on bone changes on plain x-ray. She has failed conservative measures. Pain is rated as severe.  Past Medical History  Diagnosis Date  . Hypertension   . Sleep apnea     MILD SLEEP APNEA, NO MACHINE  6 YRS AGO [facility]   . Hypothyroidism   . Arthritis   . Neuromuscular disorder     MS TX WITH CYMBALTA   . Multiple sclerosis    Past Surgical History  Procedure Date  . Tonsillectomy   . Tumor removal     FROM PELVIS AGE   . Cesarean section     X2   . No past surgeries     BLADDER SLING  4-5 YRS AGO   . Knee arthroscopy     LT KNEE 04/2011   History   Social History  . Marital Status: Married    Spouse Name: N/A    Number of Children: N/A  . Years of Education: N/A   Social History Main Topics  . Smoking status: Never Smoker   . Smokeless tobacco: None  . Alcohol Use: No  . Drug Use: No  . Sexually Active:    Other Topics Concern  . None   Social History Narrative  . None   History reviewed. No pertinent family history. No Known Allergies Prior to Admission medications   Medication Sig Start Date End Date Taking? Authorizing Provider  baclofen (LIORESAL) 10 MG tablet Take 10 mg by mouth 3 (three) times daily.   Yes Historical Provider, MD  CALCIUM-MAGNESIUM PO Take 1 tablet by mouth daily.   Yes Historical Provider, MD  cholecalciferol (VITAMIN D) 1000 UNITS tablet Take 4,000 Units by mouth daily.   Yes Historical Provider, MD  clonazePAM (KLONOPIN) 0.5 MG tablet Take 0.5 mg by mouth at bedtime as needed. For sleep   Yes Historical Provider, MD  DULoxetine (CYMBALTA) 60 MG capsule Take 60 mg by mouth daily.   Yes Historical Provider, MD  esomeprazole (NEXIUM) 40 MG capsule Take 40 mg by mouth daily before breakfast.   Yes Historical Provider, MD   gabapentin (NEURONTIN) 300 MG capsule Take 300 mg by mouth 3 (three) times daily.   Yes Historical Provider, MD  levothyroxine (SYNTHROID, LEVOTHROID) 25 MCG tablet Take 25 mcg by mouth daily.   Yes Historical Provider, MD  losartan-hydrochlorothiazide (HYZAAR) 50-12.5 MG per tablet Take 1 tablet by mouth daily.   Yes Historical Provider, MD  Multiple Vitamins-Minerals (MULTIVITAMINS THER. W/MINERALS) TABS Take 1 tablet by mouth daily.   Yes Historical Provider, MD  traMADol (ULTRAM) 50 MG tablet Take 100 mg by mouth every 6 (six) hours as needed. For pain   Yes Historical Provider, MD     Positive ROS: none  All other systems have been reviewed and were otherwise negative with the exception of those mentioned in the HPI and as above.  Physical Exam: Filed Vitals:   08/15/11 1029  BP: 145/83  Pulse: 66  Temp: 98.4 F (36.9 C)  Resp: 20    General: Alert, no acute distress Cardiovascular: No pedal edema Respiratory: No cyanosis, no use of accessory musculature GI: No organomegaly, abdomen is soft and non-tender Skin: No lesions in the area of chief complaint Neurologic: Sensation intact distally Psychiatric: Patient  is competent for consent with normal mood and affect Lymphatic: No axillary or cervical lymphadenopathy  MUSCULOSKELETAL: L. Knee pain on rom. +TTP over med side.  No instability.  Assessment/Plan: degenerative joimnt disease Plan for Procedure(s): TOTAL KNEE ARTHROPLASTY  The risks benefits and alternatives were discussed with the patient including but not limited to the risks of nonoperative treatment, versus surgical intervention including infection, bleeding, nerve injury, malunion, nonunion, hardware prominence, hardware failure, need for hardware removal, blood clots, cardiopulmonary complications, morbidity, mortality, among others, and they were willing to proceed.  Predicted outcome is good, although there will be at least a six to nine month expected  recovery.  Harvie Junior, MD 08/15/2011 12:35 PM

## 2011-08-15 NOTE — Anesthesia Preprocedure Evaluation (Addendum)
Anesthesia Evaluation  Patient identified by MRN, date of birth, ID band Patient awake    Reviewed: Allergy & Precautions, H&P , NPO status , Patient's Chart, lab work & pertinent test results, reviewed documented beta blocker date and time   Airway Mallampati: II      Dental  (+) Teeth Intact   Pulmonary          Cardiovascular hypertension, Pt. on medications     Neuro/Psych  Neuromuscular disease    GI/Hepatic   Endo/Other  Hypothyroidism   Renal/GU      Musculoskeletal   Abdominal   Peds  Hematology   Anesthesia Other Findings   Reproductive/Obstetrics negative OB ROS                          Anesthesia Physical Anesthesia Plan  ASA: III  Anesthesia Plan: General   Post-op Pain Management: MAC Combined w/ Regional for Post-op pain   Induction: Intravenous  Airway Management Planned: Oral ETT  Additional Equipment:   Intra-op Plan:   Post-operative Plan: Extubation in OR  Informed Consent: I have reviewed the patients History and Physical, chart, labs and discussed the procedure including the risks, benefits and alternatives for the proposed anesthesia with the patient or authorized representative who has indicated his/her understanding and acceptance.     Plan Discussed with: CRNA and Surgeon  Anesthesia Plan Comments:         Anesthesia Quick Evaluation

## 2011-08-15 NOTE — Preoperative (Signed)
Beta Blockers   Reason not to administer Beta Blockers:Not Applicable 

## 2011-08-15 NOTE — Transfer of Care (Signed)
Immediate Anesthesia Transfer of Care Note  Patient: Julie Ross  Procedure(s) Performed: Procedure(s) (LRB): TOTAL KNEE ARTHROPLASTY (Left)  Patient Location: PACU  Anesthesia Type: General  Level of Consciousness: awake, alert  and oriented  Airway & Oxygen Therapy: Patient Spontanous Breathing and Patient connected to nasal cannula oxygen  Post-op Assessment: Report given to PACU RN, Post -op Vital signs reviewed and stable and Patient moving all extremities X 4  Post vital signs: Reviewed and stable  Complications: No apparent anesthesia complications

## 2011-08-16 ENCOUNTER — Encounter (HOSPITAL_COMMUNITY): Payer: Self-pay | Admitting: Orthopedic Surgery

## 2011-08-16 ENCOUNTER — Other Ambulatory Visit: Payer: Self-pay

## 2011-08-16 LAB — CBC
HCT: 33.2 % — ABNORMAL LOW (ref 36.0–46.0)
Hemoglobin: 11.1 g/dL — ABNORMAL LOW (ref 12.0–15.0)
MCH: 27.7 pg (ref 26.0–34.0)
MCHC: 33.4 g/dL (ref 30.0–36.0)
MCV: 82.8 fL (ref 78.0–100.0)
Platelets: 236 10*3/uL (ref 150–400)
RBC: 4.01 MIL/uL (ref 3.87–5.11)
RDW: 13.4 % (ref 11.5–15.5)
WBC: 13.4 10*3/uL — ABNORMAL HIGH (ref 4.0–10.5)

## 2011-08-16 LAB — BASIC METABOLIC PANEL
BUN: 13 mg/dL (ref 6–23)
CO2: 22 mEq/L (ref 19–32)
Calcium: 8.9 mg/dL (ref 8.4–10.5)
Chloride: 96 mEq/L (ref 96–112)
Creatinine, Ser: 0.82 mg/dL (ref 0.50–1.10)
GFR calc Af Amer: >90 mL/min (ref >90)
GFR calc non Af Amer: 81 mL/min — ABNORMAL LOW (ref >90)
Glucose, Bld: 169 mg/dL — ABNORMAL HIGH (ref 70–99)
Potassium: 3.8 mEq/L (ref 3.5–5.1)
Sodium: 131 mEq/L — ABNORMAL LOW (ref 135–145)

## 2011-08-16 LAB — PROTIME-INR
INR: 1.05 (ref 0.00–1.49)
Prothrombin Time: 13.9 seconds (ref 11.6–15.2)

## 2011-08-16 MED ORDER — MORPHINE SULFATE (PF) 1 MG/ML IV SOLN
INTRAVENOUS | Status: AC
  Administered 2011-08-16: 08:00:00
  Filled 2011-08-16: qty 25

## 2011-08-16 MED ORDER — MORPHINE SULFATE (PF) 1 MG/ML IV SOLN
INTRAVENOUS | Status: AC
  Filled 2011-08-16: qty 25

## 2011-08-16 MED ORDER — WARFARIN SODIUM 6 MG PO TABS
6.0000 mg | ORAL_TABLET | Freq: Once | ORAL | Status: AC
  Administered 2011-08-16: 6 mg via ORAL
  Filled 2011-08-16: qty 1

## 2011-08-16 NOTE — Progress Notes (Signed)
CARE MANAGEMENT NOTE 08/16/2011      Action/Plan:   Spoke with patient and husband. Choice offered. Patient was preoperatively setup with [facility], no changes. CPM and 3in1 to be delivered by [facility].   Anticipated DC Date:  08/18/2011   Anticipated DC Plan:  HOME W HOME HEALTH SERVICES  In-house referral  NA      DC Planning Services  CM consult      PAC Choice  DURABLE MEDICAL EQUIPMENT  HOME HEALTH   Choice offered to / List presented to:  C-1 Patient   DME arranged  3-N-1  CPM      DME agency  [facility]     HH arranged  HH-2 PT      HH agency  [facility]   Status of service:  Completed, signed off  Discharge Disposition:  HOME W HOME HEALTH SERVICES

## 2011-08-16 NOTE — Op Note (Signed)
NAME:  Julie Ross, Julie Ross                 ACCOUNT NO.:  [id number]  MEDICAL RECORD NO.:  [id number]  LOCATION:  5023                         FACILITY:  [facility]  PHYSICIAN:  Harvie Junior, M.D.   DATE OF BIRTH:  1961/06/03  DATE OF PROCEDURE:  08/15/2011 DATE OF DISCHARGE:                              OPERATIVE REPORT   PREOPERATIVE DIAGNOSIS:  End-stage degenerative joint disease, bilateral knees.  POSTOPERATIVE DIAGNOSIS:  End-stage degenerative joint disease, bilateral knees.  PROCEDURE:  Left total knee replacement with Sigma system, size 3 femur, size 3 tibia, 10 mm bridging bearing, and a 35 mm all polyethylene patella with computer assistance to achieve a perfect neutral long alignment.  SURGEON:  Harvie Junior, MD  ASSISTANT:  Marshia Ly, P.A.  ANESTHESIA:  general.  BRIEF HISTORY:  Julie Ross is a 51 year old female with a long history of having significant complaints of bilateral knee pain, left greater than right.  She had a scope.  She failed injection therapy, viscous supplementation, use of a cane, activity modification, anti-inflammatory medication.  Because of continued complaints of pain and x-rays which showed bone-on-bone changes, she was ultimately taken to the operating room.  Given her young age, we felt that computer assistance alignment would be critical in trying to preserve neutral long alignment and longevity of the implant and this was discussed and chosen to be used on the patient on a preoperative basis.  She was brought to the operating room with this procedure.  PROCEDURE:  The patient was taken to the operating room.  After adequate anesthesia obtained with general anesthetic, the patient was placed supine on operating table.  The left leg was then prepped and draped in usual sterile fashion.  Following this, the leg was exsanguinated. Blood pressure tourniquet inflated to 350 mmHg.  Following this, midline incision was made.  Subcutaneous  tissues down the level of extensor mechanism, medial parapatellar arthrotomy was undertaken.  Following this, medial and lateral meniscus were removed, retropatellar fat pad, anterior and posterior cruciates and synovium in the anterior aspect of the femur.  Once this was completed, attention was turned towards placement of the computer modules.  Two pins were placed in the tibia, 2 pins in the femur, and the rays were placed and the registration process undertaken.  Computer systems adds about 30 minutes to the surgical procedure.  Once this was completed, the registration process completed. Attention was then turned to the tibia which was cut perpendicular to the long axis of the tibia under computer assistance.  Once this was completed, attention was turned to the femur which was cut perpendicular to the anatomic axis.  Once this was done, the spacer block was put in place, which showed [place] perfect gap balance in neutral long alignment. Once this was completed, attention was turned towards the femur which was sized to a 3 and then anterior and posterior cuts were made as well as chamfer cuts and box.  Attention was then turned back to the tibia where it was drilled and keeled and once this was completed, attention was turned towards trial components, size 3 femur, size 3 tibia, 10 mm bridging bearing, computers now used  to check perfect neutral long alignment and gap balance.  Seeing this to be perfect, attention was then turned towards the patella which was cut down to a level of 13 and then lugs were drilled for a 35 mm patella.  Once this was completed, all the trial components were removed, and the knee was then copiously and thoroughly lavaged with pulsatile lavage irrigation and suctioned dry.  The knee was then suctioned completely dry and the final components were then cemented into place, size 3 femur, size 3 tibia, and a 10 mm neutral bearing trial was used and a 35 mm all poly  patella held with a clamp.  Once the cement was allowed to harden, then the knee was put through a range of motion.  It was noted to be stable in all directions.  Got perfect gap balance in neutral long alignment at this point with the computer assistance and the computer modules were removed.  At this point, tourniquet was let down and all bleeding controlled with electrocautery.  Once this was completed, the knee was copiously and thoroughly lavaged 1 final time and then the final 10 mm polyethylene was put in place.  Once that was completed, a medium Hemovac drain was placed and the medial parapatellar arthrotomy was closed with #1 Vicryl running, skin with 0 and 2-0 Vicryl and the subcutaneous tissues were closed with 0 and 2-0 Vicryl and 3-0 Monocryl subcuticular.  Benzoin and Steri-Strips were applied.  Sterile compressive dressing was applied and the patient taken to recovery room. She was noted to be in satisfactory condition.  The use of a computer in this case was because of her young age and need for perfect neutral long alignment certainly adds 30 minutes of surgical procedure and add to the complexity with decision making relative to the amount of data which is being generated intraoperatively. Marshia Ly assisted throughout the case, was critical in his performance.  At this point, the final knee had a sterile compressive dressing applied and was taken to recovery room.  She was noted to be in satisfactory condition.  Estimated blood loss for this procedure was less than 50 mL.     Harvie Junior, M.D.     Ranae Plumber  D:  08/15/2011  T:  08/16/2011  Job:  [id number]

## 2011-08-16 NOTE — Progress Notes (Signed)
Subjective: 1 Day Post-Op Procedure(s) (LRB): TOTAL KNEE ARTHROPLASTY (Left) Patient reports pain as 5 on 0-10 scale.   Taking po ok  Foley out this am.  Up in chair.  Objective: Vital signs in last 24 hours: Temp:  [97.1 F (36.2 C)-98.4 F (36.9 C)] 97.1 F (36.2 C) (02/26 0610) Pulse Rate:  [66-90] 84  (02/26 0610) Resp:  [10-20] 20  (02/26 0610) BP: (106-157)/(58-85) 126/75 mmHg (02/26 0610) SpO2:  [92 %-100 %] 99 % (02/26 0610) Weight:  [93.3 kg (205 lb 11 oz)] 93.3 kg (205 lb 11 oz) (02/25 1645)  Intake/Output from previous day: 02/25 0701 - 02/26 0700 In: 3180 [I.V.:3025; IV Piggyback:155] Out: 1550 [Urine:1350; Drains:50; Blood:150] Intake/Output this shift:     Basename 08/16/11 0545  HGB 11.1*    Basename 08/16/11 0545  WBC 13.4*  RBC 4.01  HCT 33.2*  PLT 236    Basename 08/16/11 0545  NA 131*  K 3.8  CL 96  CO2 22  BUN 13  CREATININE 0.82  GLUCOSE 169*  CALCIUM 8.9    Basename 08/16/11 0545  LABPT --  INR 1.05  Left knee exam:  Neurovascular intact Sensation intact distally Intact pulses distally Dorsiflexion/Plantar flexion intact Incision: dressing C/D/I  Assessment/Plan: 1 Day Post-Op Procedure(s) (LRB): TOTAL KNEE ARTHROPLASTY (Left) PLAN: Advance diet Up with therapy Cont PCA MS Coumadin for VTE prevention.  Darleen Moffitt G 08/16/2011, 9:43 AM

## 2011-08-16 NOTE — Progress Notes (Signed)
Physical Therapy Evaluation Note  Past Medical History  Diagnosis Date  . Hypertension   . Sleep apnea     MILD SLEEP APNEA, NO MACHINE  6 YRS AGO HPT [facility]IONAL   . Hypothyroidism   . Arthritis   . Neuromuscular disorder     MS TX WITH CYMBALTA   . Multiple sclerosis    Past Surgical History  Procedure Date  . Tonsillectomy   . Tumor removal     FROM PELVIS AGE   . Cesarean section     X2   . No past surgeries     BLADDER SLING  4-5 YRS AGO   . Knee arthroscopy     LT KNEE 04/2011     08/16/11 0824  PT Visit Information  Last PT Received On 08/16/11  Precautions  Precautions Knee  Required Braces or Orthoses Yes  Knee Immobilizer On except when in CPM (L LE)  Restrictions  LLE Weight Bearing WBAT  Home Living  Lives With Spouse (has 75 yo and 60 yo dtrs who is going to stay with pt)  Type of Home House  Home Layout Two level;Bed/bath upstairs  Alternate Level Stairs-Rails Right  Alternate Level Stairs-Number of Steps 16  Home Access Stairs to enter  Entrance Stairs-Rails Left (5 STE front, 2 steps with no hand rail in garage)  Entrance Stairs-Number of Steps 5  Scientist, physiological Yes  How Accessible Accessible via walker  Home Adaptive Equipment Bedside commode/3-in-1;Shower chair with back;Straight cane;Walker - rolling  Prior Function  Level of Independence Independent with basic ADLs;Independent with homemaking with ambulation;Independent with gait;Independent with transfers  Able to Take Stairs? 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  Cognition  Arousal/Alertness Awake/alert  Overall Cognitive Status Appears within functional limits for tasks assessed  Orientation Level Oriented X4  Cognition - Other Comments pt a "loopy" from pain medication  Sensation  Light Touch Appears Intact  Bed Mobility  Bed Mobility  Yes  Supine to Sit 4: Min assist;With rails;HOB elevated (Comment degrees) (HOB 45 degrees)  Supine to Sit Details (indicate cue type and reason) assist for L LE managment, verbal cues for sequencing  Transfers  Transfers Yes  Sit to Stand 4: Min assist;From bed  Sit to Stand Details (indicate cue type and reason) verbal cues for hand placement  Stand to Sit 4: Min assist;To chair/3-in-1  Stand to Sit Details verbal cues for hand placement  Ambulation/Gait  Ambulation/Gait Yes  Ambulation/Gait Assistance 4: Min assist  Ambulation/Gait Assistance Details (indicate cue type and reason) verbal cues for sequencing and to complete L LE quad set  Ambulation Distance (Feet) 40 Feet  Assistive device Rolling walker  Gait Pattern Step-to pattern;Decreased step length - left;Decreased stance time - left  Gait velocity decreased  Stairs No  Wheelchair Mobility  Wheelchair Mobility No  Posture/Postural Control  Posture/Postural Control No significant limitations  Balance  Balance Assessed No  RUE Assessment  RUE Assessment WFL  LUE Assessment  LUE Assessment WFL  RLE Assessment  RLE Assessment WFL  LLE Assessment  LLE Assessment Not tested (pt s/p L TKA)  Cervical Assessment  Cervical Assessment [facility]  Thoracic Assessment  Thoracic Assessment [facility]  Lumbar Assessment  Lumbar Assessment [facility]  Exercises  Exercises Total Joint  Total Joint Exercises  Ankle Circles/Pumps AROM;Both;10 reps;Supine  Quad Sets AROM;Left;Supine;20 reps  PT -  End of Session  Equipment Utilized During Treatment Gait belt;Left knee immobilizer  Activity Tolerance Patient limited by pain;Patient tolerated treatment well  Patient left in chair;with call bell in reach (LEs elevated)  Nurse Communication Mobility status for ambulation  CPM Left Knee  CPM Left Knee Off  General  Cognition WFL for tasks performed  PT Assessment  Clinical Impression Statement Pt s/p L TKA presenting with 10/10 knee pain s/p tx  session and decreased active L knee ROM and L LE strength. Patient reports to have support at home provided by family.   PT Recommendation/Assessment Patient will need skilled PT in the acute care venue  PT Problem List Decreased strength;Decreased range of motion;Decreased activity tolerance;Decreased balance;Decreased mobility  PT Therapy Diagnosis  Difficulty walking;Abnormality of gait;Generalized weakness;Acute pain  PT Plan  PT Frequency 7X/week  PT Treatment/Interventions DME instruction;Stair training;Gait training;Functional mobility training;Therapeutic activities;Therapeutic exercise  PT Recommendation  Follow Up Recommendations Home health PT;Supervision/Assistance - 24 hour  Equipment Recommended None recommended by PT (pt has all recommended DME)  Individuals Consulted  Consulted and Agree with Results and Recommendations Patient  Acute Rehab PT Goals  PT Goal Formulation With patient  Time For Goal Achievement 7 days  Pt will go Supine/Side to Sit with modified independence;with HOB 0 degrees  PT Goal: Supine/Side to Sit - Progress Goal set today  Pt will go Sit to Stand with modified independence (up to RW)  PT Goal: Sit to Stand - Progress Goal set today  Pt will Ambulate >150 feet;with modified independence;with rolling walker  PT Goal: Ambulate - Progress Goal set today  Pt will Go Up / Down Stairs Flight;with min assist;with rail(s) (or backwards with RW)  PT Goal: Up/Down Stairs - Progress Goal set today  Pt will Perform Home Exercise Program Independently  PT Goal: Perform Home Exercise Program - Progress Goal set today    Pain: initially 5/10 upon arrival in L knee, s/p PT 10/10. Patient hit PCA pump x 4 during PT session.  Lewis Shock, PT, DPT Pager #: [phone] Office #: [phone]

## 2011-08-16 NOTE — Progress Notes (Signed)
Occupational Therapy Evaluation Patient Details Name: Julie Ross MRN: [id number] DOB: 09/08/1960 Today's Date: 08/16/2011  Problem List:  Patient Active Problem List  Diagnoses  . Osteoarthritis of both knees  . Osteoarthritis of left knee    Past Medical History:  Past Medical History  Diagnosis Date  . Hypertension   . Sleep apnea     MILD SLEEP APNEA, NO MACHINE  6 YRS AGO HPT REGIONAL   . Hypothyroidism   . Arthritis   . Neuromuscular disorder     MS TX WITH CYMBALTA   . Multiple sclerosis    Past Surgical History:  Past Surgical History  Procedure Date  . Tonsillectomy   . Tumor removal     FROM PELVIS AGE   . Cesarean section     X2   . No past surgeries     BLADDER SLING  4-5 YRS AGO   . Knee arthroscopy     LT KNEE 04/2011    OT Assessment/Plan/Recommendation OT Assessment Clinical Impression Statement: 51 yo s/p L TKA. Cpmpleted all education regarding available AE and DME to increase indep with aDL and functional mobility fro ADL to return to PLOF. Pt plans to D/C home with assistance of husband and daughter. Pt will need BSC for D/C. OT Recommendation/Assessment: Patient does not need any further OT services OT Recommendation Follow Up Recommendations: No OT follow up Equipment Recommended: 3 in 1 bedside comode OT Goals Acute Rehab OT Goals OT Goal Formulation:  (eval only)  OT Evaluation Precautions/Restrictions  Precautions Precautions: Knee Required Braces or Orthoses: Yes Knee Immobilizer: On except when in CPM Restrictions Weight Bearing Restrictions: Yes LLE Weight Bearing: Weight bearing as tolerated Prior Functioning Home Living Lives With: Spouse (has 110 yo and 77 yo dtrs who is going to stay with pt) Type of Home: House Home Layout: Two level;Bed/bath upstairs Alternate Level Stairs-Rails: Right Alternate Level Stairs-Number of Steps: 16 Home Access: Stairs to enter Entrance Stairs-Rails: Left (5 STE front, 2 steps with no hand  rail in garage) Entrance Stairs-Number of Steps: 5 Bathroom Shower/Tub: Health visitor: Standard Bathroom Accessibility: Yes How Accessible: Accessible via walker Home Adaptive Equipment: Bedside commode/3-in-1;Shower chair with back;Straight cane;Walker - rolling Prior Function Level of Independence: Independent with basic ADLs;Independent with homemaking with ambulation;Independent with gait;Independent with transfers Able to Take Stairs?: Yes Driving: Yes Vocation: Full time employment 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 Eating/Feeding: Simulated;Independent Where Assessed - Eating/Feeding: Chair Grooming: Performed;Wash/dry hands;Modified independent Where Assessed - Grooming: Standing at sink Upper Body Bathing: Simulated;Set up Where Assessed - Upper Body Bathing: Sitting, chair Lower Body Bathing: Simulated;Minimal assistance Where Assessed - Lower Body Bathing: Sit to stand from bed Upper Body Dressing: Performed;Set up Where Assessed - Upper Body Dressing: Sitting, bed Lower Body Dressing: Performed;Minimal assistance Where Assessed - Lower Body Dressing: Sit to stand from bed Toilet Transfer: Performed;Minimal assistance Toilet Transfer Method: Ambulating Toilet Transfer Equipment: Comfort height toilet Toileting - Clothing Manipulation: Performed;Modified independent Where Assessed - Toileting Clothing Manipulation: Standing Toileting - Hygiene: Performed;Independent Where Assessed - Toileting Hygiene: Sit to stand from 3-in-1 or toilet Tub/Shower Transfer: Simulated;Minimal assistance Tub/Shower Transfer Method: Ambulating Tub/Shower Transfer Equipment: Other (comment) (3 in 1) Equipment Used: Long-handled shoe horn;Long-handled sponge;Reacher;Rolling walker;Sock aid Ambulation Related to ADLs: Min A ADL Comments: Assistance for LB aDL only. Educated pt on available AE and DME to increase indep with ADL.    Vision/Perception  Vision - History Baseline  Vision: No visual deficits Cognition Cognition Arousal/Alertness: Awake/alert Overall Cognitive Status: Appears within functional limits for tasks assessed Orientation Level: Oriented X4 Sensation/Coordination Sensation Light Touch: Appears Intact Extremity Assessment RUE Assessment RUE Assessment: Within Functional Limits LUE Assessment LUE Assessment: Within Functional Limits Mobility  Bed Mobility Bed Mobility: Yes Supine to Sit: 5: Supervision Transfers Transfers: Yes Sit to Stand: 5: Supervision Sit to Stand Details (indicate cue type and reason): contact guard Stand to Sit: 5: Supervision Stand to Sit Details: contact guard, verbal cues for hand placement Exercises Total Joint Exercises Ankle Circles/Pumps: AROM Quad Sets: AROM;Left;10 reps;Supine End of Session OT - End of Session Equipment Utilized During Treatment: Gait belt;Left knee immobilizer Activity Tolerance: Patient limited by pain Patient left: in chair;with call bell in reach;with family/visitor present Nurse Communication: Mobility status for transfers General Behavior During Session: [facility] for tasks performed Cognition: [facility] for tasks performed   [facility] 08/16/2011, 3:50 PM  [facility], OTR/L  [phone] 08/16/2011

## 2011-08-16 NOTE — Discharge Instructions (Signed)
Home Health to be provided by [facility] [phone] 

## 2011-08-16 NOTE — Consult Note (Signed)
ANTICOAGULATION CONSULT NOTE   Pharmacy Consult for Coumadin Indication: VTE prophylaxis  No Known Allergies  Patient Measurements: 93.3kg 168cm  Vital Signs: Temp: 97.1 F (36.2 C) (02/26 0610) Temp src: Oral (02/25 2119) BP: 126/75 mmHg (02/26 0610) Pulse Rate: 84  (02/26 0610)  Labs:  Basename 08/16/11 0545  HGB 11.1*  HCT 33.2*  PLT 236  APTT --  LABPROT 13.9  INR 1.05  HEPARINUNFRC --  CREATININE 0.82  CKTOTAL --  CKMB --  TROPONINI --   Estimated Creatinine Clearance: 92.4 ml/min (by C-G formula based on Cr of 0.82).  Medical History: Past Medical History  Diagnosis Date  . Hypertension   . Sleep apnea     MILD SLEEP APNEA, NO MACHINE  6 YRS AGO HPT REGIONAL   . Hypothyroidism   . Arthritis   . Neuromuscular disorder     MS TX WITH CYMBALTA   . Multiple sclerosis     Medications:  Prescriptions prior to admission  Medication Sig Dispense Refill  . baclofen (LIORESAL) 10 MG tablet Take 10 mg by mouth 3 (three) times daily.      Marland Kitchen CALCIUM-MAGNESIUM PO Take 1 tablet by mouth daily.      . cholecalciferol (VITAMIN D) 1000 UNITS tablet Take 4,000 Units by mouth daily.      . clonazePAM (KLONOPIN) 0.5 MG tablet Take 0.5 mg by mouth at bedtime as needed. For sleep      . DULoxetine (CYMBALTA) 60 MG capsule Take 60 mg by mouth daily.      Marland Kitchen esomeprazole (NEXIUM) 40 MG capsule Take 40 mg by mouth daily before breakfast.      . gabapentin (NEURONTIN) 300 MG capsule Take 300 mg by mouth 3 (three) times daily.      Marland Kitchen levothyroxine (SYNTHROID, LEVOTHROID) 25 MCG tablet Take 25 mcg by mouth daily.      Marland Kitchen losartan-hydrochlorothiazide (HYZAAR) 50-12.5 MG per tablet Take 1 tablet by mouth daily.      . Multiple Vitamins-Minerals (MULTIVITAMINS THER. W/MINERALS) TABS Take 1 tablet by mouth daily.      . traMADol (ULTRAM) 50 MG tablet Take 100 mg by mouth every 6 (six) hours as needed. For pain       Assessment: Julie Ross s/p left TKA to begin coumadin for VTE  prophylaxis.  Coumadin score=6.  Goal of Therapy:  "Shoot for INR of 2" per MD request   Plan:  1) Coumadin 6 mg x 1 2) Daily INR   Leovardo Thoman Poteet 08/16/2011,9:18 AM

## 2011-08-16 NOTE — Progress Notes (Signed)
Physical Therapy Treatment Note   08/16/11 1450  PT Visit Information  Last PT Received On 08/16/11  Precautions  Precautions Knee  Knee Immobilizer On except when in CPM  Restrictions  LLE Weight Bearing WBAT  Bed Mobility  Bed Mobility (pt received sitting up in chair)  Transfers  Sit to Stand 4: Min assist  Sit to Stand Details (indicate cue type and reason) contact guard  Stand to Sit 4: Min assist  Stand to Sit Details contact guard, verbal cues for hand placement  Ambulation/Gait  Ambulation/Gait Assistance 4: Min assist  Ambulation Distance (Feet) 50 Feet  Assistive device Rolling walker  Gait Pattern Step-to pattern  Gait velocity decreased  Stairs No  Exercises  Exercises (hand out provided)  Total Joint Exercises  Ankle Circles/Pumps AROM  Quad Sets AROM;Left;10 reps;Supine  PT - End of Session  Equipment Utilized During Treatment Gait belt;Left knee immobilizer  Activity Tolerance Patient tolerated treatment well (freq rest breaks due to nausea)  Patient left in chair;with call bell in reach;with family/visitor present  Nurse Communication Mobility status for transfers  General  Behavior During Session [facility] for tasks performed  Cognition [facility] for tasks performed  PT - Assessment/Plan  PT Frequency 7X/week  Follow Up Recommendations Home health PT  Equipment Recommended None recommended by PT  Acute Rehab PT Goals  PT Goal: Supine/Side to Sit - Progress Progressing toward goal  PT Goal: Sit to Stand - Progress Progressing toward goal  PT Goal: Ambulate - Progress Progressing toward goal  PT Goal: Up/Down Stairs - Progress Progressing toward goal  PT Goal: Perform Home Exercise Program - Progress Progressing toward goal    Pain: c/o nausea, pt reports pain as "it's okay right now"  Lewis Shock, PT, DPT Pager #: [phone] Office #: [phone]

## 2011-08-17 LAB — CBC
HCT: 28.3 % — ABNORMAL LOW (ref 36.0–46.0)
Hemoglobin: 9.4 g/dL — ABNORMAL LOW (ref 12.0–15.0)
MCH: 27.3 pg (ref 26.0–34.0)
MCHC: 33.2 g/dL (ref 30.0–36.0)
MCV: 82.3 fL (ref 78.0–100.0)
Platelets: 220 10*3/uL (ref 150–400)
RBC: 3.44 MIL/uL — ABNORMAL LOW (ref 3.87–5.11)
RDW: 13.3 % (ref 11.5–15.5)
WBC: 13.5 10*3/uL — ABNORMAL HIGH (ref 4.0–10.5)

## 2011-08-17 LAB — PROTIME-INR
INR: 1.2 (ref 0.00–1.49)
Prothrombin Time: 15.5 seconds — ABNORMAL HIGH (ref 11.6–15.2)

## 2011-08-17 MED ORDER — WARFARIN SODIUM 6 MG PO TABS
6.0000 mg | ORAL_TABLET | Freq: Once | ORAL | Status: AC
  Administered 2011-08-17: 6 mg via ORAL
  Filled 2011-08-17: qty 1

## 2011-08-17 MED ORDER — OXYCODONE HCL 20 MG PO TB12
20.0000 mg | ORAL_TABLET | Freq: Two times a day (BID) | ORAL | Status: DC
  Administered 2011-08-17 – 2011-08-18 (×3): 20 mg via ORAL
  Filled 2011-08-17 (×3): qty 1

## 2011-08-17 MED ORDER — WARFARIN - PHARMACIST DOSING INPATIENT
Freq: Every day | Status: DC
  Filled 2011-08-17 (×2): qty 1

## 2011-08-17 MED ORDER — METOCLOPRAMIDE HCL 5 MG/ML IJ SOLN
10.0000 mg | Freq: Three times a day (TID) | INTRAMUSCULAR | Status: DC
  Administered 2011-08-17 – 2011-08-18 (×3): 10 mg via INTRAVENOUS
  Filled 2011-08-17 (×6): qty 2

## 2011-08-17 MED ORDER — DOCUSATE SODIUM 100 MG PO CAPS
100.0000 mg | ORAL_CAPSULE | Freq: Two times a day (BID) | ORAL | Status: DC
  Administered 2011-08-17 – 2011-08-18 (×3): 100 mg via ORAL
  Filled 2011-08-17 (×4): qty 1

## 2011-08-17 NOTE — Progress Notes (Signed)
Utilization review completed. Deidrea Gaetz, RN, BSN. 08/17/11 

## 2011-08-17 NOTE — Progress Notes (Signed)
Subjective: 2 Days Post-Op Procedure(s) (LRB): TOTAL KNEE ARTHROPLASTY (Left) Patient reports pain as 4 on 0-10 scale.   C/o indigestion with "burning " in esophagus. Neg EKG yest. Taking po/voiding ok. Needs 1 more day of PT for safety/stairs.  Objective: Vital signs in last 24 hours: Temp:  [97 F (36.1 C)-98.2 F (36.8 C)] 97 F (36.1 C) (02/27 0602) Pulse Rate:  [62-71] 62  (02/27 0602) Resp:  [18-20] 20  (02/27 0602) BP: (141-147)/(77-85) 147/85 mmHg (02/27 0602) SpO2:  [95 %-98 %] 98 % (02/27 0602)      Basename 08/17/11 0525 08/16/11 0545  HGB 9.4* 11.1*    Basename 08/17/11 0525 08/16/11 0545  WBC 13.5* 13.4*  RBC 3.44* 4.01  HCT 28.3* 33.2*  PLT 220 236    Basename 08/16/11 0545  NA 131*  K 3.8  CL 96  CO2 22  BUN 13  CREATININE 0.82  GLUCOSE 169*  CALCIUM 8.9    Basename 08/17/11 0525 08/16/11 0545  LABPT -- --  INR 1.20 1.05  Left knee exam:  Neurovascular intact Sensation intact distally Intact pulses distally Dorsiflexion/Plantar flexion intact Incision: dressing C/D/I  Assessment/Plan: 2 Days Post-Op Procedure(s) (LRB): TOTAL KNEE ARTHROPLASTY (Left) PLAN;  Up with therapy D/C IV fluids Plan for discharge tomorrow with HHPT and HHRN Try Reglan 10mg  IV for epigastric discomfort. Dressing changed/hemovac drain pulled. D/C PCA MS Cont coumadin. Julie Ross 08/17/2011, 12:57 PM

## 2011-08-17 NOTE — Progress Notes (Signed)
Physical Therapy Treatment Patient Details Name: Julie Ross MRN: [id number] DOB: [birth year] Today's Date: 08/17/2011  PT Assessment/Plan  PT - Assessment/Plan Comments on Treatment Session: Pt able to practice steps this afternoon and increase ambulation. Will need to practice steps again tomorrow prior to DC home.  PT Plan: Discharge plan remains appropriate PT Frequency: 7X/week Follow Up Recommendations: Home health PT Equipment Recommended: 3 in 1 bedside comode PT Goals  Acute Rehab PT Goals PT Goal: Supine/Side to Sit - Progress: Progressing toward goal PT Goal: Sit to Stand - Progress: Progressing toward goal PT Goal: Ambulate - Progress: Progressing toward goal PT Goal: Up/Down Stairs - Progress: Progressing toward goal PT Goal: Perform Home Exercise Program - Progress: Progressing toward goal  PT Treatment Precautions/Restrictions  Precautions Precautions: Knee Required Braces or Orthoses: Yes Knee Immobilizer: On except when in CPM Restrictions Weight Bearing Restrictions: Yes LLE Weight Bearing: Weight bearing as tolerated Mobility (including Balance) Bed Mobility Supine to Sit: 6: Modified independent (Device/Increase time) Supine to Sit Details (indicate cue type and reason): pt able to manage L LE with KI Sit to Supine: 6: Modified independent (Device/Increase time) Transfers Sit to Stand: 5: Supervision;From bed;With upper extremity assist Sit to Stand Details (indicate cue type and reason): verbal cues for hand placement Stand to Sit: With upper extremity assist;5: Supervision;To bed Stand to Sit Details: Cues for safe hand placement Ambulation/Gait Ambulation/Gait Assistance: Other (comment);4: Min assist (MinGUard A) Ambulation/Gait Assistance Details (indicate cue type and reason): Cues for safe positioning inside of RW.  Ambulation Distance (Feet): 210 Feet Assistive device: Rolling walker Gait Pattern: Step-to pattern;Decreased stride length Gait  velocity: WFL Stairs: Yes Stairs Assistance: 4: Min assist Stairs Assistance Details (indicate cue type and reason): Cues for sequency and technique. A for balance Stair Management Technique: Two rails;Forwards Number of Stairs: 2  (practiced twice)  Posture/Postural Control Posture/Postural Control: No significant limitations End of Session PT - End of Session Equipment Utilized During Treatment: Gait belt Activity Tolerance: Patient tolerated treatment well Patient left: in bed;with call bell in reach;with family/visitor present General Behavior During Session: [facility] for tasks performed Cognition: [facility] for tasks performed  Fredrich Birks 08/17/2011, 2:44 PM 08/17/2011 Fredrich Birks PTA [phone] pager [phone] office

## 2011-08-17 NOTE — Consult Note (Signed)
ANTICOAGULATION CONSULT NOTE   Pharmacy Consult for Coumadin Indication: VTE prophylaxis  No Known Allergies  Patient Measurements: 93.3kg 168cm  Vital Signs: Temp: 97 F (36.1 C) (02/27 0602) BP: 147/85 mmHg (02/27 0602) Pulse Rate: 62  (02/27 0602)  Labs:  Basename 08/17/11 0525 08/16/11 0545  HGB 9.4* 11.1*  HCT 28.3* 33.2*  PLT 220 236  APTT -- --  LABPROT 15.5* 13.9  INR 1.20 1.05  HEPARINUNFRC -- --  CREATININE -- 0.82  CKTOTAL -- --  CKMB -- --  TROPONINI -- --   Estimated Creatinine Clearance: 92.4 ml/min (by C-G formula based on Cr of 0.82).  Medical History: Past Medical History  Diagnosis Date  . Hypertension   . Sleep apnea     MILD SLEEP APNEA, NO MACHINE  6 YRS AGO HPT REGIONAL   . Hypothyroidism   . Arthritis   . Neuromuscular disorder     MS TX WITH CYMBALTA   . Multiple sclerosis     Medications:  Prescriptions prior to admission  Medication Sig Dispense Refill  . baclofen (LIORESAL) 10 MG tablet Take 10 mg by mouth 3 (three) times daily.      Marland Kitchen CALCIUM-MAGNESIUM PO Take 1 tablet by mouth daily.      . cholecalciferol (VITAMIN D) 1000 UNITS tablet Take 4,000 Units by mouth daily.      . clonazePAM (KLONOPIN) 0.5 MG tablet Take 0.5 mg by mouth at bedtime as needed. For sleep      . DULoxetine (CYMBALTA) 60 MG capsule Take 60 mg by mouth daily.      Marland Kitchen esomeprazole (NEXIUM) 40 MG capsule Take 40 mg by mouth daily before breakfast.      . gabapentin (NEURONTIN) 300 MG capsule Take 300 mg by mouth 3 (three) times daily.      Marland Kitchen levothyroxine (SYNTHROID, LEVOTHROID) 25 MCG tablet Take 25 mcg by mouth daily.      Marland Kitchen losartan-hydrochlorothiazide (HYZAAR) 50-12.5 MG per tablet Take 1 tablet by mouth daily.      . Multiple Vitamins-Minerals (MULTIVITAMINS THER. W/MINERALS) TABS Take 1 tablet by mouth daily.      . traMADol (ULTRAM) 50 MG tablet Take 100 mg by mouth every 6 (six) hours as needed. For pain       Assessment: 50yof s/p left TKA to begin  coumadin for VTE prophylaxis.  Coumadin score=6.  Goal of Therapy:  "Shoot for INR of 2" per MD request   Plan:  1) Coumadin 6 mg x 1 again today 2) Daily INR   Miliano Cotten Poteet 08/17/2011,9:55 AM

## 2011-08-17 NOTE — Progress Notes (Signed)
Physical Therapy Treatment Note   08/17/11 1100  PT Visit Information  Last PT Received On 08/16/11  Precautions  Precautions Knee  Knee Immobilizer On except when in CPM  Restrictions  LLE Weight Bearing WBAT  Bed Mobility  Supine to Sit 5: Supervision  Supine to Sit Details (indicate cue type and reason) pt able to manage L LE with KI  Transfers  Sit to Stand 5: Supervision  Sit to Stand Details (indicate cue type and reason) verbal cues for hand placement  Stand to Sit 5: Supervision  Stand to Sit Details verbal cues to reach back and L LE management  Ambulation/Gait  Ambulation/Gait Assistance 4: Min assist  Ambulation/Gait Assistance Details (indicate cue type and reason) contact guard  Ambulation Distance (Feet) 100 Feet  Assistive device Rolling walker  Gait Pattern Step-to pattern;Decreased step length - right;Decreased stance time - right  Gait velocity decreased  Stairs No  Posture/Postural Control  Posture/Postural Control No significant limitations  Total Joint Exercises  Ankle Circles/Pumps AROM;Both;10 reps  Quad Sets AROM;10 reps;Left  Heel Slides AAROM;Left;10 reps;Seated  Long Arc Quad AROM;Left;10 reps;Seated  PT - End of Session  Equipment Utilized During Treatment Gait belt;Left knee immobilizer  Activity Tolerance Patient tolerated treatment well  Patient left in chair;with call bell in reach;with family/visitor present  General  Behavior During Session [facility] for tasks performed  Cognition [facility] for tasks performed  PT - Assessment/Plan  Comments on Treatment Session Patient c/o remains to be nausea. Patient supervision with transfer on/off commode. Pt I with hygiene tolieting in standing.  PT Plan Discharge plan remains appropriate;Frequency remains appropriate  PT Frequency 7X/week  Follow Up Recommendations Home health PT  Acute Rehab PT Goals  PT Goal: Supine/Side to Sit - Progress Progressing toward goal  PT Goal: Sit to Stand - Progress  Progressing toward goal  PT Goal: Ambulate - Progress Progressing toward goal  PT Goal: Up/Down Stairs - Progress Progressing toward goal  PT Goal: Perform Home Exercise Program - Progress Progressing toward goal    Pain: 7/10 L knee burning pain  Plan: work on stair negotiation, trial ambulation without L KI  Lewis Shock, PT, DPT Pager #: [phone] Office #: [phone]

## 2011-08-18 LAB — PROTIME-INR
INR: 1.46 (ref 0.00–1.49)
Prothrombin Time: 18 seconds — ABNORMAL HIGH (ref 11.6–15.2)

## 2011-08-18 MED ORDER — WARFARIN SODIUM 5 MG PO TABS: ORAL_TABLET | ORAL | Status: DC

## 2011-08-18 MED ORDER — OXYCODONE-ACETAMINOPHEN 5-325 MG PO TABS: 1.0000 | ORAL_TABLET | Freq: Four times a day (QID) | ORAL | Status: AC | PRN

## 2011-08-18 MED ORDER — OXYCODONE HCL 20 MG PO TB12: 20.0000 mg | ORAL_TABLET | Freq: Two times a day (BID) | ORAL | Status: AC

## 2011-08-18 MED ORDER — WARFARIN SODIUM 6 MG PO TABS
6.0000 mg | ORAL_TABLET | Freq: Once | ORAL | Status: DC
  Filled 2011-08-18: qty 1

## 2011-08-18 MED ORDER — METHOCARBAMOL 750 MG PO TABS: ORAL_TABLET | ORAL | Status: DC

## 2011-08-18 NOTE — Progress Notes (Signed)
Physical Therapy Treatment Note   08/18/11 1300  PT Visit Information  Last PT Received On 08/18/11  Precautions  Precautions Knee  Restrictions  LLE Weight Bearing WBAT  Bed Mobility  Bed Mobility (pt received sitting up in chair)  Transfers  Sit to Stand 5: Supervision  Stand to Sit 5: Supervision  Ambulation/Gait  Ambulation/Gait Assistance 4: Min assist (contact guard)  Assistive device Rolling walker  Gait Pattern Step-to pattern;Decreased step length - left;Decreased stance time - left  Gait velocity wfl  Stairs Yes  Stairs Assistance 4: Min assist (via HHA on L UE, due to only having R hand rail at home, )  Stair Management Technique One rail Right to mimic stairs to 2nd level,  (HHA L UE)  Also practiced fwd/backwards with RW on platform step to mimic home entry  Number of Stairs 10   Height of Stairs 8   PT - End of Session  Equipment Utilized During Treatment Gait belt  Activity Tolerance Patient tolerated treatment well (however had episode of nausea)  Patient left in chair;with call bell in reach;with family/visitor present  General  Behavior During Session [facility] for tasks performed  Cognition [facility] for tasks performed  PT - Assessment/Plan  Comments on Treatment Session Patient and spouse demonstrated safe stair negotiation to access bed/bath at home. Patient safe to d/c home this date with family for support/supervision/assist in addition to recommended DME.  PT Plan Discharge plan remains appropriate;Frequency remains appropriate  PT Frequency 7X/week  Follow Up Recommendations Home health PT  Acute Rehab PT Goals  PT Goal: Supine/Side to Sit - Progress Progressing toward goal  PT Goal: Sit to Stand - Progress Progressing toward goal  PT Goal: Ambulate - Progress Progressing toward goal  PT Goal: Up/Down Stairs - Progress Progressing toward goal  PT Goal: Perform Home Exercise Program - Progress Progressing toward goal   Pain: 4/10 L surgical knee  pain  Lewis Shock, PT, DPT Pager #: [phone] Office #: [phone]

## 2011-08-18 NOTE — Progress Notes (Signed)
ANTICOAGULATION CONSULT NOTE - Follow Up Consult  Pharmacy Consult for Coumadin Indication: VTE prophylaxis  No Known Allergies  Patient Measurements: Height: 5\' 6"  (167.6 cm) Weight: 205 lb 11 oz (93.3 kg) IBW/kg (Calculated) : 59.3   Vital Signs: Temp: 98.2 F (36.8 C) (02/28 1310) BP: 128/59 mmHg (02/28 1310) Pulse Rate: 72  (02/28 1310)  Labs:  Basename 08/18/11 0542 08/17/11 0525 08/16/11 0545  HGB -- 9.4* 11.1*  HCT -- 28.3* 33.2*  PLT -- 220 236  APTT -- -- --  LABPROT 18.0* 15.5* 13.9  INR 1.46 1.20 1.05  HEPARINUNFRC -- -- --  CREATININE -- -- 0.82  CKTOTAL -- -- --  CKMB -- -- --  TROPONINI -- -- --   Estimated Creatinine Clearance: 92.4 ml/min (by C-G formula based on Cr of 0.82).   Medications:  Scheduled:    . baclofen  10 mg Oral TID  . docusate sodium  100 mg Oral BID  . DULoxetine  60 mg Oral Daily  . ferrous sulfate  325 mg Oral BID WC  . gabapentin  300 mg Oral TID  . losartan  50 mg Oral Daily   And  . hydrochlorothiazide  12.5 mg Oral Daily  . ketorolac  30 mg Intravenous Q8H  . levothyroxine  25 mcg Oral Daily  . metoCLOPramide (REGLAN) injection  10 mg Intravenous Q8H  . oxyCODONE  20 mg Oral Q12H  . pantoprazole  80 mg Oral Q1200  . warfarin  6 mg Oral ONCE-1800  . Warfarin - Pharmacist Dosing Inpatient   Does not apply q1800  . DISCONTD: morphine   Intravenous Q4H    Assessment: 51 y/o female patient s/p Left TKA requiring coumadin for dvt px. INR trend up, no bleeding reported.  Goal of Therapy:  INR 2-3   Plan:  Repeat coumadin 6mg  today and f/u in am.  Verlene Mayer, PharmD, BCPS Pager [phone] 08/18/2011,2:08 PM

## 2011-08-18 NOTE — Progress Notes (Signed)
Pt is s/p L TKR. Knee precautions. +cms. Pt is WBAT with RW. CPM per order. Pt has 1+ nonpitting edema of LLE. Pt has a mepilex and gauze dressing to L knee that has a few stain spots to both. Pt has TEDS on. Pt is alert and oriented x 3. Pt just noted to occasionally be slow to respond. Pt, per rept, has a history of MS. Pt repts prod cough with green phlegm. No s/sx resp or cardiac distress and no c/o such. Pt performs IS per order.  Pt sats 94% RA. Heart rate regular rate and rhythm. Pt voids quantity sufficient without difficulty. Abdomen soft flat nontender and nondistended. BS+x4. Pt repts passing gas. Pt denies nausea or vomiting and tolerates diet fair to good. Pt repts LBM 2/24. Pt is receiving stool softners per order. Pt offered a laxative and pt states, "I will take care of that at home".

## 2011-08-24 NOTE — Discharge Summary (Signed)
Patient ID: Julie Ross MRN: [id number] DOB/AGE: [birth year] 51 y.o.  Admit date: 08/15/2011 Discharge date: 08/18/11 Admission Diagnoses:  Principal Problem:  *Osteoarthritis of left knee Active Problems:  Osteoarthritis of both knees   Discharge Diagnoses:  Same  Past Medical History  Diagnosis Date  . Hypertension   . Sleep apnea     MILD SLEEP APNEA, NO MACHINE  6 YRS AGO [facility]   . Hypothyroidism   . Arthritis   . Neuromuscular disorder     MS TX WITH CYMBALTA   . Multiple sclerosis     Surgeries: Procedure(s):Left TOTAL KNEE ARTHROPLASTY on 08/15/2011 computer assisted  Discharged Condition: Improved  Hospital Course: Julie Ross is an 51 y.o. female who was admitted 08/15/2011 for operative treatment ofOsteoarthritis of left knee. Patient has severe unremitting pain that affects sleep, daily activities, and work/hobbies. After pre-op clearance the patient was taken to the operating room on 08/15/2011 and underwent  Procedure(s): TOTAL KNEE ARTHROPLASTY.    Patient was given perioperative antibiotics:  Anti-infectives     Start     Dose/Rate Route Frequency Ordered Stop   08/15/11 2000   ceFAZolin (ANCEF) IVPB 2 g/50 mL premix        2 g 100 mL/hr over 30 Minutes Intravenous Every 6 hours 08/15/11 1708 08/16/11 0958   08/14/11 0930   ceFAZolin (ANCEF) IVPB 2 g/50 mL premix        2 g 100 mL/hr over 30 Minutes Intravenous 60 min pre-op 08/14/11 8657 08/15/11 1309           Patient was given sequential compression devices, early ambulation, and chemoprophylaxis to prevent DVT.  Patient benefited maximally from hospital stay and there were no complications.     Recent laboratory studies: see chart  Discharge Medications:   Medication List  As of 08/24/2011  5:06 PM   TAKE these medications         baclofen 10 MG tablet   Commonly known as: LIORESAL   Take 10 mg by mouth 3 (three) times daily.      CALCIUM-MAGNESIUM PO   Take 1 tablet by mouth  daily.      cholecalciferol 1000 UNITS tablet   Commonly known as: VITAMIN D   Take 4,000 Units by mouth daily.      clonazePAM 0.5 MG tablet   Commonly known as: KLONOPIN   Take 0.5 mg by mouth at bedtime as needed. For sleep      DULoxetine 60 MG capsule   Commonly known as: CYMBALTA   Take 60 mg by mouth daily.      esomeprazole 40 MG capsule   Commonly known as: NEXIUM   Take 40 mg by mouth daily before breakfast.      gabapentin 300 MG capsule   Commonly known as: NEURONTIN   Take 300 mg by mouth 3 (three) times daily.      levothyroxine 25 MCG tablet   Commonly known as: SYNTHROID, LEVOTHROID   Take 25 mcg by mouth daily.      losartan-hydrochlorothiazide 50-12.5 MG per tablet   Commonly known as: HYZAAR   Take 1 tablet by mouth daily.      methocarbamol 750 MG tablet   Commonly known as: ROBAXIN   One q 8hrs prn spasm.      multivitamins ther. w/minerals Tabs   Take 1 tablet by mouth daily.      oxyCODONE 20 MG 12 hr tablet   Commonly known as: OXYCONTIN  Take 1 tablet (20 mg total) by mouth every 12 (twelve) hours.      oxyCODONE-acetaminophen 5-325 MG per tablet   Commonly known as: PERCOCET   Take 1-2 tablets by mouth every 6 (six) hours as needed for pain.      traMADol 50 MG tablet   Commonly known as: ULTRAM   Take 100 mg by mouth every 6 (six) hours as needed. For pain      warfarin 5 MG tablet   Commonly known as: COUMADIN   One tablet daily unless otherwise directed. Take x 1 month post op.            Diagnostic Studies: Dg Chest 2 View  08/11/2011  *RADIOLOGY REPORT*  Clinical Data: Preop  CHEST - 2 VIEW  Comparison: Report 02/10/2006 no images available for comparison  Findings: Cardiomediastinal silhouette is unremarkable.  No acute infiltrate or pleural effusion.  No pulmonary edema.  Minimal degenerative changes thoracic spine.  IMPRESSION: No active disease.  Original Report Authenticated By: Natasha Mead, M.D.    Disposition:  Home-Health Care Svc  Discharge Orders    Future Orders Please Complete By Expires   Diet general      Weight Bearing as taught in Physical Therapy      Comments:   Use a walker or crutches as instructed.   Call MD / Call 911      Comments:   If you experience chest pain or shortness of breath, CALL 911 and be transported to the hospital emergency room.  If you develope a fever above 101 F, pus (white drainage) or increased drainage or redness at the wound, or calf pain, call your surgeon's office.   CPM      Comments:   Continuous passive motion machine (CPM):      Use the CPM from 0 to 60 degrees for 8 hours per day.      You may increase by 10 degrees per day.  You may break it up into 2 or 3 sessions per day.      Use CPM for 1-2 weeks or until you are told to stop.   TED hose      Comments:   Use stockings (TED hose) for 2 weeks on both leg(s).  You may remove them at night for sleeping.   Do not put a pillow under the knee. Place it under the heel.         Follow-up Information    Follow up with GRAVES,JOHN L, MD. Schedule an appointment as soon as possible for a visit in 10 days.   Contact information:   [address] [place] [address] [phone]           Signed: Matthew Folks 08/24/2011, 5:06 PM

## 2011-09-05 ENCOUNTER — Other Ambulatory Visit: Payer: Self-pay | Admitting: Orthopedic Surgery

## 2011-09-05 ENCOUNTER — Ambulatory Visit
Admission: RE | Admit: 2011-09-05 | Discharge: 2011-09-05 | Disposition: A | Discharge status: Home / Self Care | Payer: BC Managed Care – PPO | Source: Ambulatory Visit | Attending: Orthopedic Surgery | Admitting: Orthopedic Surgery

## 2011-09-05 DIAGNOSIS — M7989 Other specified soft tissue disorders: Secondary | ICD-10-CM

## 2011-09-05 DIAGNOSIS — R52 Pain, unspecified: Secondary | ICD-10-CM

## 2011-11-02 ENCOUNTER — Other Ambulatory Visit: Payer: Self-pay | Admitting: Dermatology

## 2011-11-11 ENCOUNTER — Other Ambulatory Visit: Payer: Self-pay | Admitting: Obstetrics and Gynecology

## 2011-11-11 DIAGNOSIS — R928 Other abnormal and inconclusive findings on diagnostic imaging of breast: Secondary | ICD-10-CM

## 2011-11-15 ENCOUNTER — Ambulatory Visit
Admission: RE | Admit: 2011-11-15 | Discharge: 2011-11-15 | Disposition: A | Discharge status: Home / Self Care | Payer: BC Managed Care – PPO | Source: Ambulatory Visit | Attending: Obstetrics and Gynecology | Admitting: Obstetrics and Gynecology

## 2011-11-15 ENCOUNTER — Other Ambulatory Visit: Payer: Self-pay | Admitting: Obstetrics and Gynecology

## 2011-11-15 DIAGNOSIS — R921 Mammographic calcification found on diagnostic imaging of breast: Secondary | ICD-10-CM

## 2011-11-15 DIAGNOSIS — R928 Other abnormal and inconclusive findings on diagnostic imaging of breast: Secondary | ICD-10-CM

## 2011-11-18 ENCOUNTER — Ambulatory Visit
Admission: RE | Admit: 2011-11-18 | Discharge: 2011-11-18 | Disposition: A | Discharge status: Home / Self Care | Payer: BC Managed Care – PPO | Source: Ambulatory Visit | Attending: Obstetrics and Gynecology | Admitting: Obstetrics and Gynecology

## 2011-11-18 ENCOUNTER — Other Ambulatory Visit: Payer: Self-pay | Admitting: Obstetrics and Gynecology

## 2011-11-18 DIAGNOSIS — R921 Mammographic calcification found on diagnostic imaging of breast: Secondary | ICD-10-CM

## 2011-11-18 DIAGNOSIS — R928 Other abnormal and inconclusive findings on diagnostic imaging of breast: Secondary | ICD-10-CM

## 2011-11-19 DIAGNOSIS — C50919 Malignant neoplasm of unspecified site of unspecified female breast: Secondary | ICD-10-CM

## 2011-11-19 HISTORY — DX: Malignant neoplasm of unspecified site of unspecified female breast: C50.919

## 2011-11-21 ENCOUNTER — Other Ambulatory Visit: Payer: BC Managed Care – PPO

## 2011-11-21 ENCOUNTER — Other Ambulatory Visit: Payer: Self-pay | Admitting: Obstetrics and Gynecology

## 2011-11-21 DIAGNOSIS — C50912 Malignant neoplasm of unspecified site of left female breast: Secondary | ICD-10-CM

## 2011-11-22 ENCOUNTER — Telehealth: Payer: Self-pay | Admitting: *Deleted

## 2011-11-22 ENCOUNTER — Other Ambulatory Visit: Payer: Self-pay | Admitting: *Deleted

## 2011-11-22 DIAGNOSIS — C50419 Malignant neoplasm of upper-outer quadrant of unspecified female breast: Secondary | ICD-10-CM

## 2011-11-22 DIAGNOSIS — C50411 Malignant neoplasm of upper-outer quadrant of right female breast: Secondary | ICD-10-CM | POA: Insufficient documentation

## 2011-11-22 NOTE — Telephone Encounter (Signed)
Confirmed [facility] for 11/30/11 at 1200 .  Instructions and contact information given.  

## 2011-11-25 ENCOUNTER — Ambulatory Visit
Admission: RE | Admit: 2011-11-25 | Discharge: 2011-11-25 | Disposition: A | Discharge status: Home / Self Care | Payer: BC Managed Care – PPO | Source: Ambulatory Visit | Attending: Obstetrics and Gynecology | Admitting: Obstetrics and Gynecology

## 2011-11-25 DIAGNOSIS — C50912 Malignant neoplasm of unspecified site of left female breast: Secondary | ICD-10-CM

## 2011-11-25 MED ORDER — GADOBENATE DIMEGLUMINE 529 MG/ML IV SOLN
18.0000 mL | Freq: Once | INTRAVENOUS | Status: AC | PRN
  Administered 2011-11-25: 18 mL via INTRAVENOUS

## 2011-11-30 ENCOUNTER — Encounter: Payer: Self-pay | Admitting: Radiation Oncology

## 2011-11-30 ENCOUNTER — Ambulatory Visit (HOSPITAL_BASED_OUTPATIENT_CLINIC_OR_DEPARTMENT_OTHER): Payer: BC Managed Care – PPO | Admitting: Surgery

## 2011-11-30 ENCOUNTER — Encounter: Payer: Self-pay | Admitting: *Deleted

## 2011-11-30 ENCOUNTER — Encounter: Payer: Self-pay | Admitting: Oncology

## 2011-11-30 ENCOUNTER — Ambulatory Visit
Admission: RE | Admit: 2011-11-30 | Discharge: 2011-11-30 | Disposition: A | Discharge status: Home / Self Care | Payer: BC Managed Care – PPO | Source: Ambulatory Visit | Attending: Radiation Oncology | Admitting: Radiation Oncology

## 2011-11-30 ENCOUNTER — Other Ambulatory Visit (HOSPITAL_BASED_OUTPATIENT_CLINIC_OR_DEPARTMENT_OTHER): Payer: BC Managed Care – PPO

## 2011-11-30 ENCOUNTER — Ambulatory Visit: Payer: BC Managed Care – PPO | Attending: Surgery | Admitting: Physical Therapy

## 2011-11-30 ENCOUNTER — Ambulatory Visit: Payer: BC Managed Care – PPO

## 2011-11-30 ENCOUNTER — Encounter (INDEPENDENT_AMBULATORY_CARE_PROVIDER_SITE_OTHER): Payer: Self-pay | Admitting: Surgery

## 2011-11-30 ENCOUNTER — Ambulatory Visit (HOSPITAL_BASED_OUTPATIENT_CLINIC_OR_DEPARTMENT_OTHER): Payer: BC Managed Care – PPO | Admitting: Oncology

## 2011-11-30 VITALS — BP 171/96 | HR 87 | Temp 97.9°F | Resp 20 | Ht 66.0 in | Wt 201.0 lb

## 2011-11-30 VITALS — BP 171/96 | HR 87 | Temp 97.9°F | Ht 66.0 in | Wt 200.9 lb

## 2011-11-30 DIAGNOSIS — IMO0001 Reserved for inherently not codable concepts without codable children: Secondary | ICD-10-CM | POA: Insufficient documentation

## 2011-11-30 DIAGNOSIS — D0590 Unspecified type of carcinoma in situ of unspecified breast: Secondary | ICD-10-CM

## 2011-11-30 DIAGNOSIS — C50919 Malignant neoplasm of unspecified site of unspecified female breast: Secondary | ICD-10-CM | POA: Insufficient documentation

## 2011-11-30 DIAGNOSIS — C50419 Malignant neoplasm of upper-outer quadrant of unspecified female breast: Secondary | ICD-10-CM

## 2011-11-30 DIAGNOSIS — R279 Unspecified lack of coordination: Secondary | ICD-10-CM | POA: Insufficient documentation

## 2011-11-30 DIAGNOSIS — R293 Abnormal posture: Secondary | ICD-10-CM | POA: Insufficient documentation

## 2011-11-30 DIAGNOSIS — R269 Unspecified abnormalities of gait and mobility: Secondary | ICD-10-CM | POA: Insufficient documentation

## 2011-11-30 LAB — CBC WITH DIFFERENTIAL/PLATELET
BASO%: 1 % (ref 0.0–2.0)
Basophils Absolute: 0.1 10*3/uL (ref 0.0–0.1)
EOS%: 3.3 % (ref 0.0–7.0)
Eosinophils Absolute: 0.2 10*3/uL (ref 0.0–0.5)
HCT: 37.4 % (ref 34.8–46.6)
HGB: 12.1 g/dL (ref 11.6–15.9)
LYMPH%: 25.9 % (ref 14.0–49.7)
MCH: 25.7 pg (ref 25.1–34.0)
MCHC: 32.4 g/dL (ref 31.5–36.0)
MCV: 79.5 fL (ref 79.5–101.0)
MONO#: 0.5 10*3/uL (ref 0.1–0.9)
MONO%: 6.4 % (ref 0.0–14.0)
NEUT#: 4.5 10*3/uL (ref 1.5–6.5)
NEUT%: 63.4 % (ref 38.4–76.8)
Platelets: 244 10*3/uL (ref 145–400)
RBC: 4.71 10*6/uL (ref 3.70–5.45)
RDW: 14.8 % — ABNORMAL HIGH (ref 11.2–14.5)
WBC: 7.2 10*3/uL (ref 3.9–10.3)
lymph#: 1.9 10*3/uL (ref 0.9–3.3)

## 2011-11-30 LAB — COMPREHENSIVE METABOLIC PANEL
ALT: 11 U/L (ref 0–35)
AST: 14 U/L (ref 0–37)
Albumin: 3.6 g/dL (ref 3.5–5.2)
Alkaline Phosphatase: 116 U/L (ref 39–117)
BUN: 11 mg/dL (ref 6–23)
CO2: 27 mEq/L (ref 19–32)
Calcium: 9.6 mg/dL (ref 8.4–10.5)
Chloride: 101 mEq/L (ref 96–112)
Creatinine, Ser: 0.88 mg/dL (ref 0.50–1.10)
Glucose, Bld: 127 mg/dL — ABNORMAL HIGH (ref 70–99)
Potassium: 3.4 mEq/L — ABNORMAL LOW (ref 3.5–5.3)
Sodium: 139 mEq/L (ref 135–145)
Total Bilirubin: 0.3 mg/dL (ref 0.3–1.2)
Total Protein: 7 g/dL (ref 6.0–8.3)

## 2011-11-30 NOTE — Progress Notes (Signed)
Julie Ross [id number] February 06, 1961 51 y.o. 11/30/2011 3:44 PM  CC Dr. Cyndia Bent Dr. Antony Blackbird Dr. 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 Patient is being seen in the multidisciplinary breast clinic.  STAGE:   Cancer of upper-outer quadrant of female breast   Primary site: Breast (Left)   Staging method: AJCC 7th Edition   Clinical: Stage 0 (Tis (DCIS), N0, cM0)   Summary: Stage 0 (Tis (DCIS), N0, cM0)  REFERRING PHYSICIAN: Dr. Cyndia Bent  HISTORY OF PRESENT ILLNESS:  Julie Ross is a 51 y.o. female With medical history significant for multiple sclerosis osteoarthritis and previous history of basal cell and squamous cell carcinoma. Recently patient presented for screening mammograms and she was found to have asymmetry in the left breast. This mamm[facility] was [facility] 11[facility]07[facility] She then went on to have a diagnostic left mammogram that showed no mass or distortion in the left upper outer quadrant however there were noted to be increasing mouth microcalcifications in the left upper outer quadrant varying in size and shape there were some linear forms. A biopsy was suggested. On 531 patient had biopsy performed the pathology showed ductal carcinoma in situ with comedonecrosis and calcifications. The tumor was estrogen receptor +100% progesterone receptor +100%. She has also gone on to have MRI of the breasts performed on 11/25/2011. The MRI of the left breast showed a 6 x 9 x 7 cm area of linear and stippled enhancement throughout the central and upper outer left breast exhibiting both rapid washout and plateau of kinetics. This enhancement Compass a few nodular areas including a 7 mm irregular mass anteriorly at the 6:00 position of the left breast. No abnormal appearing or  enlarged lymph nodes were identified. There was no abnormality in the right breast. Patient is now seen in the multidisciplinary breast clinic for discussion of treatment options. She is without any complaints.   Past Medical History: Past Medical History  Diagnosis Date  . Hypertension   . Sleep apnea     MILD SLEEP APNEA, NO MACHINE  6 YRS AGO [facility]   . Hypothyroidism   . Arthritis   . Neuromuscular disorder     MS TX WITH CYMBALTA   . Multiple sclerosis   . H/O colonoscopy   . H/O bone density study 03/2011  . Wears glasses   . Breast cancer   . Heart burn   . Depression     Past Surgical History: Past Surgical History  Procedure Date  . Tonsillectomy   . Tumor removal     FROM PELVIS AGE   . Cesarean section     X2   . No past surgeries     BLADDER SLING  4-5 YRS AGO   . Knee arthroscopy     LT KNEE 04/2011  . Total knee arthroplasty 08/15/2011    Procedure: TOTAL KNEE ARTHROPLASTY;  Surgeon: Harvie Junior, MD;  Location: [facility] OR;  Service: Orthopedics;  Laterality: Left;  RIGHT KNEE CORTIZONE INJECTION  . Mohs surgery     Family History: Family History  Problem Relation Age of Onset  . Breast cancer Paternal Aunt   . 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 She is married to  Debroah Loop hopes. She does not smoke does not drink History  Substance Use Topics  . Smoking status: Never Smoker   . Smokeless tobacco: Not on file  . Alcohol Use: No    Allergies: No Known Allergies  Current Medications: Current Outpatient Prescriptions  Medication Sig Dispense Refill  . baclofen (LIORESAL) 10 MG tablet Take 10 mg by mouth 3 (three) times daily.      Marland Kitchen CALCIUM-MAGNESIUM PO Take 1 tablet by mouth daily.      . cholecalciferol (VITAMIN D) 1000 UNITS tablet Take 4,000 Units by mouth daily.      . clonazePAM (KLONOPIN) 0.5 MG tablet Take 0.5 mg by mouth at  bedtime as needed. For sleep      . DULoxetine (CYMBALTA) 60 MG capsule Take 60 mg by mouth daily.      Marland Kitchen esomeprazole (NEXIUM) 40 MG capsule Take 40 mg by mouth daily before breakfast.      . gabapentin (NEURONTIN) 300 MG capsule Take 300 mg by mouth 3 (three) times daily.      Marland Kitchen HYDROcodone-acetaminophen (NORCO) 5-325 MG per tablet Take 1 tablet by mouth as needed.      Marland Kitchen levothyroxine (SYNTHROID, LEVOTHROID) 25 MCG tablet Take 25 mcg by mouth daily.      Marland Kitchen losartan-hydrochlorothiazide (HYZAAR) 50-12.5 MG per tablet Take 1 tablet by mouth daily.      . Multiple Vitamins-Minerals (MULTIVITAMINS THER. W/MINERALS) TABS Take 1 tablet by mouth daily.      . naproxen sodium (ANAPROX) 550 MG tablet Take 550 mg by mouth as needed.      . methocarbamol (ROBAXIN-750) 750 MG tablet One q 8hrs prn spasm.  40 tablet  0  . traMADol (ULTRAM) 50 MG tablet Take 100 mg by mouth every 6 (six) hours as needed. For pain      . warfarin (COUMADIN) 5 MG tablet One tablet daily unless otherwise directed. Take x 1 month post op.  30 tablet  0    OB/GYN History:Menarche at age 34 she is premenopausal her last menstrual cycle was on 10/15/2011. She has had 3 term (first live birth was at 81.  Fertility Discussion:Patient has completed her family Prior History of Cancer:Basal cell and squamous cell carcinomas of the skin  Health Maintenance:  Colonoscopy2006 Bone DensityOctober 2012 Last PAP smear05/20/2013  ECOG PERFORMANCE STATUS: 1 - Symptomatic but completely ambulatory  Genetic Counseling/testing:Patient's family history was reviewed the patient's paternal grandmother had breast cancer at the age of 47 and a paternal aunt had breast cancer at age of 31. Otherwise there's no other history of cancers or malignancies. Patient's mother is alive at the age of 53 father deceased at 64. Because of this at this time genetic counseling and testing is not recommended.  REVIEW OF SYSTEMS:  A comprehensive review of  systems was negative.  PHYSICAL EXAMINATION: Blood pressure 171/96, pulse 87, temperature 97.9 F (36.6 C), height 5\' 6"  (1.676 m), weight 200 lb 14.4 oz (91.128 kg).  WGN:FAOZH, healthy and no distress SKIN: skin color, texture, turgor are normal HEAD: Normocephalic, No masses, lesions, tenderness or abnormalities EYES: PERRLA, EOMI, Conjunctiva are pink and non-injected, sclera clear EARS: External ears normal OROPHARYNX:no exudate, no erythema and lips, buccal mucosa, and tongue normal  NECK: supple, no adenopathy, thyroid normal size, non-tender, without nodularity LYMPH:  no palpable lymphadenopathy, no hepatosplenomegaly BREAST:Bilateral breasts are examined this area of ecchymosis at the biopsy site the left breast otherwise no masses nipple discharge no right breast masses or  nipple discharge. LUNGS: clear to auscultation and percussion HEART: regular rate & rhythm, no murmurs and no gallops ABDOMEN:abdomen soft, non-tender, obese, normal bowel sounds and no masses or organomegaly BACK: Back symmetric, no curvature., No CVA tenderness, Range of motion is normal EXTREMITIES:no edema, no clubbing, no cyanosis  NEURO: alert & oriented x 3 with fluent speech, no focal motor/sensory deficits, gait normal, reflexes normal and symmetric     STUDIES/RESULTS: US Breast Left  12-03-11  *RADIOLOGY REPORT*  Clinical Data:  The patient returns for evaluation of a possible asymmetry in the left breast noted on recent screening study dated 11/07/2011 from [facility].  DIGITAL DIAGNOSTIC LEFT MAMMOGRAM WITH CAD AND LEFT BREAST ULTRASOUND:  Comparison:  04/14/2010, 03/13/2009, 11/30/2007  Findings:  Additional views demonstrate no persistent mass or distortion in the left upper outer quadrant.  There are however increasing microcalcifications in the left upper outer quadrant. These vary in size and shape.  There are some linear forms.  The appearance is concerning and biopsy is  suggested. Mammographic images were processed with CAD.  On physical exam, no mass is palpated in the left upper outer quadrant.  Ultrasound is performed, showing no worrisome mass, distortion or shadowing in the left upper outer quadrant.  IMPRESSION: Suspicious calcifications in the left upper outer quadrant. Stereotactic core needle biopsy is recommended.  This has been scheduled for 11/18/2011 at 1:00 p.m.  BI-RADS CATEGORY 4:  Suspicious abnormality - biopsy should be considered.  Original Report Authenticated By: Daryl Eastern, M.D.   Mr Breast Bilateral W Wo Contrast  11/28/2011  *RADIOLOGY REPORT*  Clinical Data: 51 year old female with newly diagnosed left breast DCIS.  BUN and creatinine were obtained on site at [facility] at [address] Results:  BUN 11 mg/dL,  Creatinine 1.0 mg/dL.  BILATERAL BREAST MRI WITH AND WITHOUT CONTRAST  Technique: Multiplanar, multisequence MR images of both breasts were obtained prior to and following the intravenous administration of 18ml of Multihance.  Three dimensional images were evaluated at the independent DynaCad workstation.  Comparison:  Recent and remote [facility].  Findings: Moderate normal background parenchymal enhancement is identified.  A 6 x 9 x 7 cm (transverse X AP X CC) area of linear and stippled enhancement is identified throughout the central and upper outer left breast and exhibits both rapid washout and plateau kinetics. This abnormal area of enhancement correlates with biopsy-proven DCIS and the biopsy clip artifact is along the lateral aspect of this abnormal enhancement. This enhancement encompasses a few nodular areas including a 7 mm irregular mass anteriorly at the 6 o'clock position of the left breast (anterior third with plateau kinetics) suspicious for an invasive component.  Scattered foci bilaterally are identified. No abnormal appearing or enlarged lymph nodes are identified. The bony  structures and upper liver are within normal limits.  IMPRESSION: 6 x 9 x 7 cm linear/stippled enhancement throughout the central and upper outer left breast compatible with biopsy-proven neoplasm. Nodular areas of enhancement including an anterior 7 mm irregular mass (6 o'clock position, anterior third) suspicious for invasive components.  If breast conservation is considered or there would be a treatment change, recommend tissue sampling of the 7 mm irregular mass.  No MR evidence of right breast malignancy.  No abnormal or enlarged lymph nodes identified.  THREE-DIMENSIONAL MR IMAGE RENDERING ON INDEPENDENT WORKSTATION:  Three-dimensional MR images were rendered by post-processing of the original MR data on an independent workstation.  The three- dimensional  MR images were interpreted, and findings were reported in the accompanying complete MRI report for this study.  BI-RADS CATEGORY 6:  Known biopsy-proven malignancy - appropriate action should be taken.  Recommend treatment plan.  Please note the possibility of an invasive component anteriorly as discussed above.  Original Report Authenticated By: Rosendo Gros, M.D.   Mm Breast Stereo Biopsy Left  11/21/2011  **ADDENDUM** CREATED: 11/21/2011 15:59:34  The pathology demonstrated DCIS.  This is concordant with imaging findings.  Dr. Jean Rosenthal discussed findings by telephone with the patient and answered her questions.  The patient states that the biopsy site is clean and dry without hematoma formation and mild tenderness is present.  Post biopsy wound care instructions were reviewed with the patient.  The patient has been scheduled for breast MRI on 11/25/2011. Also, the patient has been scheduled for the breast cancer multidisciplinary clinic at the [facility] on 11/30/2011.  The patient was encouraged to call the Breast Center for additional questions or concerns.  Addended by:  Rolla Plate, M.D. on 11/21/2011 15:59:34.  **END ADDENDUM**  SIGNED BY: Rolla Plate, M.D.   11/18/2011  *RADIOLOGY REPORT*  Clinical Data:  Suspicious left breast calcifications  STEREOTACTIC-GUIDED VACUUM ASSISTED BIOPSY OF THE LEFT BREAST AND SPECIMEN RADIOGRAPH  I met with the patient and we discussed the procedure of stereotactic-guided biopsy, including benefits and alternatives. We discussed the high likelihood of a successful procedure. We discussed the risks of the procedure, including infection, bleeding, tissue injury, clip migration, and inadequate sampling. Informed, written consent was given.  Using sterile technique, 2% lidocaine, stereotactic guidance, and a 9 gauge vacuum assisted device, biopsy was performed of suspicious left breast calcifications in the lateral left breast.  Specimen radiograph was performed, showing inclusion of calcifications of concern.  Specimens with calcifications are identified for pathology.  At the conclusion of the procedure, a tissue marker clip was deployed into the biopsy cavity.  Follow-up 2-view mammogram confirmed clip in the area of concern.  IMPRESSION: Stereotactic-guided biopsy of left breast.  No apparent complications.  Original Report Authenticated By: Rolla Plate, M.D.   Mm Breast Surgical Specimen  11/21/2011  **ADDENDUM** CREATED: 11/21/2011 15:59:34  The pathology demonstrated DCIS.  This is concordant with imaging findings.  Dr. Jean Rosenthal discussed findings by telephone with the patient and answered her questions.  The patient states that the biopsy site is clean and dry without hematoma formation and mild tenderness is present.  Post biopsy wound care instructions were reviewed with the patient.  The patient has been scheduled for breast MRI on 11/25/2011. Also, the patient has been scheduled for the breast cancer multidisciplinary clinic at the [facility] on 11/30/2011.  The patient was encouraged to call the Breast Center for additional questions or concerns.  Addended by:  Rolla Plate, M.D. on 11/21/2011 15:59:34.  **END ADDENDUM** SIGNED BY: Rolla Plate, M.D.   11/18/2011  *RADIOLOGY REPORT*  Clinical Data:  Suspicious left breast calcifications  STEREOTACTIC-GUIDED VACUUM ASSISTED BIOPSY OF THE LEFT BREAST AND SPECIMEN RADIOGRAPH  I met with the patient and we discussed the procedure of stereotactic-guided biopsy, including benefits and alternatives. We discussed the high likelihood of a successful procedure. We discussed the risks of the procedure, including infection, bleeding, tissue injury, clip migration, and inadequate sampling. Informed, written consent was given.  Using sterile technique, 2% lidocaine, stereotactic guidance, and a 9 gauge vacuum assisted device, biopsy was performed of suspicious left breast calcifications in  the lateral left breast.  Specimen radiograph was performed, showing inclusion of calcifications of concern.  Specimens with calcifications are identified for pathology.  At the conclusion of the procedure, a tissue marker clip was deployed into the biopsy cavity.  Follow-up 2-view mammogram confirmed clip in the area of concern.  IMPRESSION: Stereotactic-guided biopsy of left breast.  No apparent complications.  Original Report Authenticated By: Rolla Plate, M.D.   Mm Digital Diagnostic Unilat L  11/15/2011  *RADIOLOGY REPORT*  Clinical Data:  The patient returns for evaluation of a possible asymmetry in the left breast noted on recent screening study dated 11/07/2011 from [facility].  DIGITAL DIAGNOSTIC LEFT MAMMOGRAM WITH CAD AND LEFT BREAST ULTRASOUND:  Comparison:  04/14/2010, 03/13/2009, 11/30/2007  Findings:  Additional views demonstrate no persistent mass or distortion in the left upper outer quadrant.  There are however increasing microcalcifications in the left upper outer quadrant. These vary in size and shape.  There are some linear forms.  The appearance is concerning and biopsy is suggested. Mammographic images were  processed with CAD.  On physical exam, no mass is palpated in the left upper outer quadrant.  Ultrasound is performed, showing no worrisome mass, distortion or shadowing in the left upper outer quadrant.  IMPRESSION: Suspicious calcifications in the left upper outer quadrant. Stereotactic core needle biopsy is recommended.  This has been scheduled for 11/18/2011 at 1:00 p.m.  BI-RADS CATEGORY 4:  Suspicious abnormality - biopsy should be considered.  Original Report Authenticated By: Daryl Eastern, M.D.     LABS:    Chemistry      Component Value Date/Time   NA 139 11/30/2011 1240   K 3.4* 11/30/2011 1240   CL 101 11/30/2011 1240   CO2 27 11/30/2011 1240   BUN 11 11/30/2011 1240   CREATININE 0.88 11/30/2011 1240      Component Value Date/Time   CALCIUM 9.6 11/30/2011 1240   ALKPHOS 116 11/30/2011 1240   AST 14 11/30/2011 1240   ALT 11 11/30/2011 1240   BILITOT 0.3 11/30/2011 1240      Lab Results  Component Value Date   WBC 7.2 11/30/2011   HGB 12.1 11/30/2011   HCT 37.4 11/30/2011   MCV 79.5 11/30/2011   PLT 244 11/30/2011       PATHOLOGY: ADDITIONAL INFORMATION: PROGNOSTIC INDICATORS - ACIS Results IMMUNOHISTOCHEMICAL AND MORPHOMETRIC ANALYSIS BY THE AUTOMATED CELLULAR IMAGING SYSTEM (ACIS) Estrogen Receptor (Negative, <1%): 100%, STRONG STAINING INTENSITY Progesterone Receptor (Negative, <1%): 100%, STRONG STAINING INTENSITY All controls stained appropriately Pecola Leisure MD Pathologist, Electronic Signature ( Signed 11/23/2011) FINAL DIAGNOSIS Diagnosis Breast, left, needle core biopsy - DUCTAL CARCINOMA IN SITU WITH COMEDO NECROSIS AND CALCIFICATION SEE COMMENT Microscopic Comment Breast prognostic studies are pending and will be reported in an addendum. The case was reviewed with Dr Colonel Bald, who concurs. [place] RUND DO Pathologist, Electronic Signature (Case signed 11/21/2011) Specimen Gross and Clinical Information   ASSESSMENT    51 year old female with new  diagnosis of ductal carcinoma in situ of the left breast. The MRI of the breast showed an extensive area of involvement. Patient's case was discussed at the multidisciplinary breast conference and she is now seen in the multidisciplinary breast clinic. She was seen by Dr. Cyndia Bent and Dr. Sharlett Iles. After review of her pathology mammograms ultrasounds and MRI it was recommended that patient proceed with a mastectomy due to the extent of disease. Patient certainly would be a candidate for reconstruction but we would do delayed reconstruction to  make sure that she did not have any evidence of invasive cancer requiring the possibility of radiation. Adjuvantly I would plan on giving her antiestrogen therapy if she indeed does have your DCIS. All of this was discussed with the patient. I went over the diagnosis and treatment options with the patient and her husband. I went over the pathology.    PLAN:    #1 patient will proceed with a mastectomy with delayed reconstruction.  #2 patient will eventually receive adjuvant antiestrogen therapy consisting of tamoxifen since she is premenopausal.  #3 patient will be seen back after her surgery. A followup appointment has been set up for about 4-6 weeks time.       Discussion: Patient is being treated per NCCN breast cancer care guidelines appropriate for stage.Patient's treatment recommendations are made based on NCCN guidelines for stage 0 breast cancer. She is  Candidate for chemoprevention with tamoxifen for DCIS   Thank you so much for allowing me to participate in the care of Julie Ross. I will continue to follow up the patient with you and assist in her care.  All questions were answered. The patient knows to call the clinic with any problems, questions or concerns. We can certainly see the patient much sooner if necessary.  I spent 60 minutes counseling the patient face to face. The total time spent in the appointment was 60  minutes.  Drue Second, MD Medical/Oncology [facility] [phone] (beeper) [phone] (Office)  11/30/2011, 3:44 PM

## 2011-11-30 NOTE — Patient Instructions (Signed)
I will see you back after your surgery 

## 2011-11-30 NOTE — Progress Notes (Signed)
Mailed after appt letter to pt. 

## 2011-11-30 NOTE — Patient Instructions (Signed)
We will schedule a left mastectomy and removal of sentinel nodes. You will need to spend the night after surgery.

## 2011-11-30 NOTE — Progress Notes (Signed)
Radiation Oncology         [phone] ________________________________  Initial outpatient Consultation  Name: Julie Ross MRN: [id number]  Date: 11/30/2011  DOB: [birth year]  CC:No primary provider on file.  Streck, Reola Mosher, MD   REFERRING PHYSICIAN: Streck, Reola Mosher, MD  DIAGNOSIS: The encounter diagnosis was Cancer of upper-outer quadrant of female breast.  HISTORY OF PRESENT ILLNESS::Julie Ross is a 51 y.o. female who is seen as part of the multidisciplinary breast clinic. Earlier this year the patient underwent routine screening mammography which showed abnormal calcifications  within the upper-outer quadrant left breast.  Subsequent biopsy revealed ductal carcinoma in situ.  Patient she did have MRI which showed a 6 x 9 x 7 cm linear/stippled enhancement throughout the central and upper outer aspect of the left breast consistent with biopsy-proven malignancy.   PREVIOUS RADIATION THERAPY: No  PAST MEDICAL HISTORY:  has a past medical history of Hypertension; Sleep apnea; Hypothyroidism; Arthritis; Neuromuscular disorder; Multiple sclerosis; H/O colonoscopy; H/O bone density study (03/2011); Wears glasses; Breast cancer; Heart burn; and Depression.    PAST SURGICAL HISTORY: Past Surgical History  Procedure Date  . Tonsillectomy   . Tumor removal     FROM PELVIS AGE   . Cesarean section     X2   . No past surgeries     BLADDER SLING  4-5 YRS AGO   . Knee arthroscopy     LT KNEE 04/2011  . Total knee arthroplasty 08/15/2011    Procedure: TOTAL KNEE ARTHROPLASTY;  Surgeon: Harvie Junior, MD;  Location: [facility] OR;  Service: Orthopedics;  Laterality: Left;  RIGHT KNEE CORTIZONE INJECTION  . Mohs surgery     FAMILY HISTORY: family history includes Breast cancer in her maternal grandmother and paternal aunt.  SOCIAL HISTORY:  reports that she has never smoked. She does not have any smokeless tobacco history on file. She reports that she does not drink alcohol or use  illicit drugs.  ALLERGIES: Review of patient's allergies indicates no known allergies.  MEDICATIONS:  Current Outpatient Prescriptions  Medication Sig Dispense Refill  . baclofen (LIORESAL) 10 MG tablet Take 10 mg by mouth 3 (three) times daily.      Marland Kitchen CALCIUM-MAGNESIUM PO Take 1 tablet by mouth daily.      . cholecalciferol (VITAMIN D) 1000 UNITS tablet Take 4,000 Units by mouth daily.      . clonazePAM (KLONOPIN) 0.5 MG tablet Take 0.5 mg by mouth at bedtime as needed. For sleep      . DULoxetine (CYMBALTA) 60 MG capsule Take 60 mg by mouth daily.      Marland Kitchen esomeprazole (NEXIUM) 40 MG capsule Take 40 mg by mouth daily before breakfast.      . gabapentin (NEURONTIN) 300 MG capsule Take 300 mg by mouth 3 (three) times daily.      Marland Kitchen HYDROcodone-acetaminophen (NORCO) 5-325 MG per tablet Take 1 tablet by mouth as needed.      Marland Kitchen levothyroxine (SYNTHROID, LEVOTHROID) 25 MCG tablet Take 25 mcg by mouth daily.      Marland Kitchen losartan-hydrochlorothiazide (HYZAAR) 50-12.5 MG per tablet Take 1 tablet by mouth daily.      . methocarbamol (ROBAXIN-750) 750 MG tablet One q 8hrs prn spasm.  40 tablet  0  . Multiple Vitamins-Minerals (MULTIVITAMINS THER. W/MINERALS) TABS Take 1 tablet by mouth daily.      . naproxen sodium (ANAPROX) 550 MG tablet Take 550 mg by mouth as needed.      Marland Kitchen  traMADol (ULTRAM) 50 MG tablet Take 100 mg by mouth every 6 (six) hours as needed. For pain      . warfarin (COUMADIN) 5 MG tablet One tablet daily unless otherwise directed. Take x 1 month post op.  30 tablet  0    REVIEW OF SYSTEMS:  A 15 point review of systems is documented in the electronic medical record. This was obtained by the nursing staff. However, I reviewed this with the patient to discuss relevant findings and make appropriate changes.  Patient does complain of generalized fatigue. She also has discomfort in her knees and is scheduled for additional knee replacement. This does have a history of bruising easily prior to  diagnosis this denies any pain in the left breast area nipple discharge or bleeding.   PHYSICAL EXAM: Vital signs concerned 97.9 pulse 87 blood pressure 171/96 respiration rate 20 height 5 feet 6 inches weight 201 pounds. The neck is free of adenopathy. The supraclavicular and axillary areas are free of adenopathy. Examination of the lungs reveals them to be clear. The heart has a regular rhythm and rate. Examination right breast reveals no mass or nipple discharge. Examination examination of left breast reveals some bruising laterally within the breast. In the upper aspect breast there is some thickening but no dominant mass is appreciated.   LABORATORY DATA:  Lab Results  Component Value Date   WBC 7.2 11/30/2011   HGB 12.1 11/30/2011   HCT 37.4 11/30/2011   MCV 79.5 11/30/2011   PLT 244 11/30/2011   Lab Results  Component Value Date   NA 139 11/30/2011   K 3.4* 11/30/2011   CL 101 11/30/2011   CO2 27 11/30/2011   Lab Results  Component Value Date   ALT 11 11/30/2011   AST 14 11/30/2011   ALKPHOS 116 11/30/2011   BILITOT 0.3 11/30/2011     RADIOGRAPHY: US Breast Left  11/15/2011  *RADIOLOGY REPORT*  Clinical Data:  The patient returns for evaluation of a possible asymmetry in the left breast noted on recent screening study dated 11/07/2011 from [facility].  DIGITAL DIAGNOSTIC LEFT MAMMOGRAM WITH CAD AND LEFT BREAST ULTRASOUND:  Comparison:  04/14/2010, 03/13/2009, 11/30/2007  Findings:  Additional views demonstrate no persistent mass or distortion in the left upper outer quadrant.  There are however increasing microcalcifications in the left upper outer quadrant. These vary in size and shape.  There are some linear forms.  The appearance is concerning and biopsy is suggested. Mammographic images were processed with CAD.  On physical exam, no mass is palpated in the left upper outer quadrant.  Ultrasound is performed, showing no worrisome mass, distortion or shadowing in the left upper  outer quadrant.  IMPRESSION: Suspicious calcifications in the left upper outer quadrant. Stereotactic core needle biopsy is recommended.  This has been scheduled for 11/18/2011 at 1:00 p.m.  BI-RADS CATEGORY 4:  Suspicious abnormality - biopsy should be considered.  Original Report Authenticated By: Daryl Eastern, M.D.   Mr Breast Bilateral W Wo Contrast  11/28/2011  *RADIOLOGY REPORT*  Clinical Data: 51 year old female with newly diagnosed left breast DCIS.  BUN and creatinine were obtained on site at [facility] at [address] Results:  BUN 11 mg/dL,  Creatinine 1.0 mg/dL.  BILATERAL BREAST MRI WITH AND WITHOUT CONTRAST  Technique: Multiplanar, multisequence MR images of both breasts were obtained prior to and following the intravenous administration of 18ml of Multihance.  Three dimensional images were evaluated at the independent DynaCad  workstation.  Comparison:  Recent and remote mammograms from the [facility].  Findings: Moderate normal background parenchymal enhancement is identified.  A 6 x 9 x 7 cm (transverse X AP X CC) area of linear and stippled enhancement is identified throughout the central and upper outer left breast and exhibits both rapid washout and plateau kinetics. This abnormal area of enhancement correlates with biopsy-proven DCIS and the biopsy clip artifact is along the lateral aspect of this abnormal enhancement. This enhancement encompasses a few nodular areas including a 7 mm irregular mass anteriorly at the 6 o'clock position of the left breast (anterior third with plateau kinetics) suspicious for an invasive component.  Scattered foci bilaterally are identified. No abnormal appearing or enlarged lymph nodes are identified. The bony structures and upper liver are within normal limits.  IMPRESSION: 6 x 9 x 7 cm linear/stippled enhancement throughout the central and upper outer left breast compatible with biopsy-proven neoplasm. Nodular areas of enhancement  including an anterior 7 mm irregular mass (6 o'clock position, anterior third) suspicious for invasive components.  If breast conservation is considered or there would be a treatment change, recommend tissue sampling of the 7 mm irregular mass.  No MR evidence of right breast malignancy.  No abnormal or enlarged lymph nodes identified.  THREE-DIMENSIONAL MR IMAGE RENDERING ON INDEPENDENT WORKSTATION:  Three-dimensional MR images were rendered by post-processing of the original MR data on an independent workstation.  The three- dimensional MR images were interpreted, and findings were reported in the accompanying complete MRI report for this study.  BI-RADS CATEGORY 6:  Known biopsy-proven malignancy - appropriate action should be taken.  Recommend treatment plan.  Please note the possibility of an invasive component anteriorly as discussed above.  Original Report Authenticated By: Rosendo Gros, M.D.   Mm Breast Stereo Biopsy Left  11/21/2011  **ADDENDUM** CREATED: 11/21/2011 15:59:34  The pathology demonstrated DCIS.  This is concordant with imaging findings.  Dr. Jean Rosenthal discussed findings by telephone with the patient and answered her questions.  The patient states that the biopsy site is clean and dry without hematoma formation and mild tenderness is present.  Post biopsy wound care instructions were reviewed with the patient.  The patient has been scheduled for breast MRI on 11/25/2011. Also, the patient has been scheduled for the breast cancer multidisciplinary clinic at the [facility] on 11/30/2011.  The patient was encouraged to call the Breast Center for additional questions or concerns.  Addended by:  Rolla Plate, M.D. on 11/21/2011 15:59:34.  **END ADDENDUM** SIGNED BY: Rolla Plate, M.D.   11/18/2011  *RADIOLOGY REPORT*  Clinical Data:  Suspicious left breast calcifications  STEREOTACTIC-GUIDED VACUUM ASSISTED BIOPSY OF THE LEFT BREAST AND SPECIMEN RADIOGRAPH  I met with the  patient and we discussed the procedure of stereotactic-guided biopsy, including benefits and alternatives. We discussed the high likelihood of a successful procedure. We discussed the risks of the procedure, including infection, bleeding, tissue injury, clip migration, and inadequate sampling. Informed, written consent was given.  Using sterile technique, 2% lidocaine, stereotactic guidance, and a 9 gauge vacuum assisted device, biopsy was performed of suspicious left breast calcifications in the lateral left breast.  Specimen radiograph was performed, showing inclusion of calcifications of concern.  Specimens with calcifications are identified for pathology.  At the conclusion of the procedure, a tissue marker clip was deployed into the biopsy cavity.  Follow-up 2-view mammogram confirmed clip in the area of concern.  IMPRESSION: Stereotactic-guided biopsy of left  breast.  No apparent complications.  Original Report Authenticated By: Rolla Plate, M.D.   Mm Breast Surgical Specimen  11/21/2011  **ADDENDUM** CREATED: 11/21/2011 15:59:34  The pathology demonstrated DCIS.  This is concordant with imaging findings.  Dr. Jean Rosenthal discussed findings by telephone with the patient and answered her questions.  The patient states that the biopsy site is clean and dry without hematoma formation and mild tenderness is present.  Post biopsy wound care instructions were reviewed with the patient.  The patient has been scheduled for breast MRI on 11/25/2011. Also, the patient has been scheduled for the breast cancer multidisciplinary clinic at the [facility] on 11/30/2011.  The patient was encouraged to call the Breast Center for additional questions or concerns.  Addended by:  Rolla Plate, M.D. on 11/21/2011 15:59:34.  **END ADDENDUM** SIGNED BY: Rolla Plate, M.D.   11/18/2011  *RADIOLOGY REPORT*  Clinical Data:  Suspicious left breast calcifications  STEREOTACTIC-GUIDED VACUUM ASSISTED BIOPSY OF  THE LEFT BREAST AND SPECIMEN RADIOGRAPH  I met with the patient and we discussed the procedure of stereotactic-guided biopsy, including benefits and alternatives. We discussed the high likelihood of a successful procedure. We discussed the risks of the procedure, including infection, bleeding, tissue injury, clip migration, and inadequate sampling. Informed, written consent was given.  Using sterile technique, 2% lidocaine, stereotactic guidance, and a 9 gauge vacuum assisted device, biopsy was performed of suspicious left breast calcifications in the lateral left breast.  Specimen radiograph was performed, showing inclusion of calcifications of concern.  Specimens with calcifications are identified for pathology.  At the conclusion of the procedure, a tissue marker clip was deployed into the biopsy cavity.  Follow-up 2-view mammogram confirmed clip in the area of concern.  IMPRESSION: Stereotactic-guided biopsy of left breast.  No apparent complications.  Original Report Authenticated By: Rolla Plate, M.D.   Mm Digital Diagnostic Unilat L  11/15/2011  *RADIOLOGY REPORT*  Clinical Data:  The patient returns for evaluation of a possible asymmetry in the left breast noted on recent screening study dated 11/07/2011 from [facility].  DIGITAL DIAGNOSTIC LEFT MAMMOGRAM WITH CAD AND LEFT BREAST ULTRASOUND:  Comparison:  04/14/2010, 03/13/2009, 11/30/2007  Findings:  Additional views demonstrate no persistent mass or distortion in the left upper outer quadrant.  There are however increasing microcalcifications in the left upper outer quadrant. These vary in size and shape.  There are some linear forms.  The appearance is concerning and biopsy is suggested. Mammographic images were processed with CAD.  On physical exam, no mass is palpated in the left upper outer quadrant.  Ultrasound is performed, showing no worrisome mass, distortion or shadowing in the left upper outer quadrant.  IMPRESSION: Suspicious  calcifications in the left upper outer quadrant. Stereotactic core needle biopsy is recommended.  This has been scheduled for 11/18/2011 at 1:00 p.m.  BI-RADS CATEGORY 4:  Suspicious abnormality - biopsy should be considered.  Original Report Authenticated By: Daryl Eastern, M.D.      IMPRESSION: Intraductal left breast cancer. As above on MRI the patient has a significant area of involvement and in light of this would not be a  good candidate for breast conserving therapy. Assuming there is no invasive component to her large tumor the patient would be a candidate for immediate reconstruction with the anticipated clear surgical margins.  PLAN: Mastectomy and sentinel node procedure given the size of the intraductal tumor.   ------------------------------------------------

## 2011-11-30 NOTE — Progress Notes (Signed)
Patient ID: Julie Ross, female   DOB: 09/05/1960, 51 y.o.   MRN: [id number]  Chief Complaint  Patient presents with  . Breast Cancer    Left    HPI Julie Ross is a 51 y.o. female.  She recently had a mammogram which showed an abnormality. A needle core biopsy has shown DCIS. It was ER and PR receptor positive at 100% for each. She is having no prior breast symptoms. She got a significant hematoma or bruise after the biopsy which is improving. She had an MRI done which confirms a 7 cm area of abnormality in the right breast was a nodular density near one end is that it has not been biopsied. There is no evidence of lymph node metastasis. HPI  Past Medical History  Diagnosis Date  . Hypertension   . Sleep apnea     MILD SLEEP APNEA, NO MACHINE  6 YRS AGO [facility]   . Hypothyroidism   . Arthritis   . Neuromuscular disorder     MS TX WITH CYMBALTA   . Multiple sclerosis   . H/O colonoscopy   . H/O bone density study 03/2011  . Wears glasses   . Breast cancer   . Heart burn   . Depression     Past Surgical History  Procedure Date  . Tonsillectomy   . Tumor removal     FROM PELVIS AGE   . Cesarean section     X2   . No past surgeries     BLADDER SLING  4-5 YRS AGO   . Knee arthroscopy     LT KNEE 04/2011  . Total knee arthroplasty 08/15/2011    Procedure: TOTAL KNEE ARTHROPLASTY;  Surgeon: John L Graves, MD;  Location: [facility] OR;  Service: Orthopedics;  Laterality: Left;  RIGHT KNEE CORTIZONE INJECTION  . Mohs surgery     Family History  Problem Relation Age of Onset  . Breast cancer Paternal Aunt   . Breast cancer Maternal Grandmother     Social History History  Substance Use Topics  . Smoking status: Never Smoker   . Smokeless tobacco: Not on file  . Alcohol Use: No    No Known Allergies  Current Outpatient Prescriptions  Medication Sig Dispense Refill  . baclofen (LIORESAL) 10 MG tablet Take 10 mg by mouth 3 (three) times daily.      .  CALCIUM-MAGNESIUM PO Take 1 tablet by mouth daily.      . cholecalciferol (VITAMIN D) 1000 UNITS tablet Take 4,000 Units by mouth daily.      . clonazePAM (KLONOPIN) 0.5 MG tablet Take 0.5 mg by mouth at bedtime as needed. For sleep      . DULoxetine (CYMBALTA) 60 MG capsule Take 60 mg by mouth daily.      . esomeprazole (NEXIUM) 40 MG capsule Take 40 mg by mouth daily before breakfast.      . gabapentin (NEURONTIN) 300 MG capsule Take 300 mg by mouth 3 (three) times daily.      . HYDROcodone-acetaminophen (NORCO) 5-325 MG per tablet Take 1 tablet by mouth as needed.      . levothyroxine (SYNTHROID, LEVOTHROID) 25 MCG tablet Take 25 mcg by mouth daily.      . losartan-hydrochlorothiazide (HYZAAR) 50-12.5 MG per tablet Take 1 tablet by mouth daily.      . methocarbamol (ROBAXIN-750) 750 MG tablet One q 8hrs prn spasm.  40 tablet  0  . Multiple Vitamins-Minerals (MULTIVITAMINS THER. W/MINERALS) TABS   Take 1 tablet by mouth daily.      . naproxen sodium (ANAPROX) 550 MG tablet Take 550 mg by mouth as needed.      . traMADol (ULTRAM) 50 MG tablet Take 100 mg by mouth every 6 (six) hours as needed. For pain      . warfarin (COUMADIN) 5 MG tablet One tablet daily unless otherwise directed. Take x 1 month post op.  30 tablet  0    Review of Systems Review of Systems  Constitutional: Positive for fatigue. Negative for fever, chills and unexpected weight change.  HENT: Negative for hearing loss, congestion, sore throat, trouble swallowing and voice change.   Eyes: Negative for visual disturbance.  Respiratory: Negative for cough and wheezing.   Cardiovascular: Negative for chest pain, palpitations and leg swelling.  Gastrointestinal: Negative for nausea, vomiting, abdominal pain, diarrhea, constipation, blood in stool, abdominal distention and anal bleeding.       Heartburn   Genitourinary: Negative for hematuria, vaginal bleeding and difficulty urinating.  Musculoskeletal: Negative for arthralgias.   Skin: Negative for rash and wound.  Neurological: Negative for seizures, syncope and headaches.  Hematological: Negative for adenopathy. Bruises/bleeds easily.  Psychiatric/Behavioral: Negative for confusion.       Depression Forgetfulness     Blood pressure 171/96, pulse 87, temperature 97.9 F (36.6 C), resp. rate 20, height 5' 6" (1.676 m), weight 201 lb (91.173 kg).  Physical Exam Physical Exam  Vitals reviewed. Constitutional: She is oriented to person, place, and time. She appears well-developed and well-nourished. No distress.  HENT:  Head: Normocephalic and atraumatic.  Mouth/Throat: Oropharynx is clear and moist.  Eyes: Conjunctivae and EOM are normal. Pupils are equal, round, and reactive to light. No scleral icterus.  Neck: Normal range of motion. Neck supple. No tracheal deviation present. No thyromegaly present.  Cardiovascular: Normal rate, regular rhythm, normal heart sounds and intact distal pulses.  Exam reveals no gallop and no friction rub.   No murmur heard. Pulmonary/Chest: Effort normal and breath sounds normal. No respiratory distress. She has no wheezes. She has no rales.         Bruising laterally as diagrammed.  Abdominal: Soft. Bowel sounds are normal. She exhibits no distension and no mass. There is no tenderness. There is no rebound and no guarding.  Musculoskeletal: Normal range of motion. She exhibits no edema and no tenderness.  Neurological: She is alert and oriented to person, place, and time.  Skin: Skin is warm and dry. No rash noted. She is not diaphoretic. No erythema.  Psychiatric: She has a normal mood and affect. Her behavior is normal. Judgment and thought content normal.    Data Reviewed Mammogram showed increasing microcalcifications in the left upper outer quadrant very in size and shape with some linear forms. Ultrasound showed no worrisome mass. Postbiopsy MRI showed a 6 x 9 x 7 cm area of linear and stippled enhancement throughout  the central and upper-outer left breast correlating with the biopsy proven DCIS. This enhancement compost a few nodular areas including a 7 mm mass anteriorly at the 6:00 position of the left breast no abnormal lymph nodes were identified.  Assessment    1. Clinical stage 0 left breast cancer upper outer quadrant, receptor positive 2. Multiple sclerosis 3. Status post total knee replacement February/2013 4. History of skin cancer both basal cell and squamous cell     Plan    I think she will need a mastectomy given the large area of abnormality on   the MRI correlating with the calcifications on mammogram. We could attempt to do a lumpectomy with bracketing of the area but I think we would end up with significant breast deformity. I think was a mastectomy she will need a sentinel node. I think this is likely mainly DCIS and noninvasive disease so she would be a potential candidate for immediate reconstruction but I have not yet spoken with her radiation therapist about his thoughts.  I have explained the pathophysiology and staging of breast cancer with particular attention to her exact situation. We discussed the multidisciplinary approach to breast cancer which often includes both medical and radiation oncology consultations.  We also discussed surgical options for the treatment of breast cancer including lumpectomy and mastectomy with possible reconstructive surgery. In addition we talked about the evaluation and management of lymph nodes including a description of sentinel lymph node biopsy and axillary dissections. We reviewed potential complications and risks including bleeding, infection, numbness,  lymphedema, and the potential need for additional surgery.  She understands that for patients who are candidate for lumpectomy or mastectomy there is an equal survival rate with either technique, but a slightly higher local recurrence rate with lumpectomy. In addition she knows that a lumpectomy  usually requires postoperative radiation as part of the management of the breast cancer.  We have discussed the likely postoperative course and plans for followup.  I have given the patient some written information that reviewed all of these issues. I believe her questions are answered and that she has a good understanding of the issues.  The patient will be seen later this afternoon by the medical and radiation oncologists and we can make definitive plans once that is done.        Shanty Ginty J 11/30/2011, 1:38 PM    

## 2011-12-01 ENCOUNTER — Other Ambulatory Visit (INDEPENDENT_AMBULATORY_CARE_PROVIDER_SITE_OTHER): Payer: Self-pay | Admitting: Surgery

## 2011-12-01 ENCOUNTER — Encounter: Payer: Self-pay | Admitting: *Deleted

## 2011-12-01 ENCOUNTER — Telehealth: Payer: Self-pay | Admitting: Oncology

## 2011-12-01 DIAGNOSIS — C50919 Malignant neoplasm of unspecified site of unspecified female breast: Secondary | ICD-10-CM

## 2011-12-01 NOTE — Telephone Encounter (Signed)
S/w the pt and she is aware of her July appts 

## 2011-12-01 NOTE — Progress Notes (Signed)
Faxed paperwork to Dr. Odis Luster office.

## 2011-12-02 ENCOUNTER — Telehealth: Payer: Self-pay | Admitting: *Deleted

## 2011-12-02 NOTE — Telephone Encounter (Signed)
Left vm for pt to return call regarding [facility] from 11/30/11.

## 2011-12-02 NOTE — Telephone Encounter (Addendum)
Spoke to pt concerning [facility] from 11/30/11.  Pt denies questions or concerns regarding dx or treatment care plan.  Encourage pt to call with needs.  Gave pt Kaylyn Lim contact information incase questions arise while I am out of town.

## 2011-12-05 ENCOUNTER — Ambulatory Visit: Payer: BC Managed Care – PPO | Admitting: Physical Therapy

## 2011-12-05 ENCOUNTER — Encounter (HOSPITAL_COMMUNITY): Payer: Self-pay | Admitting: Pharmacy Technician

## 2011-12-09 ENCOUNTER — Telehealth (INDEPENDENT_AMBULATORY_CARE_PROVIDER_SITE_OTHER): Payer: Self-pay | Admitting: General Surgery

## 2011-12-09 NOTE — Telephone Encounter (Signed)
Pt is calling to ask if a cortisone injection to her Lt knee is okay, with surgery coming up on December 19, 2011.  Paged Dr. Jamey Ripa and updated; he agrees that cortisone injection is OK.  Returned call to pt to go ahead with cortisone.

## 2011-12-12 ENCOUNTER — Encounter (HOSPITAL_COMMUNITY)
Admission: RE | Admit: 2011-12-12 | Discharge: 2011-12-12 | Disposition: A | Discharge status: Home / Self Care | Payer: BC Managed Care – PPO | Source: Ambulatory Visit | Attending: Surgery | Admitting: Surgery

## 2011-12-12 ENCOUNTER — Encounter (HOSPITAL_COMMUNITY): Payer: Self-pay

## 2011-12-12 LAB — CBC
HCT: 38.8 % (ref 36.0–46.0)
Hemoglobin: 12.5 g/dL (ref 12.0–15.0)
MCH: 25.6 pg — ABNORMAL LOW (ref 26.0–34.0)
MCHC: 32.2 g/dL (ref 30.0–36.0)
MCV: 79.5 fL (ref 78.0–100.0)
Platelets: 252 10*3/uL (ref 150–400)
RBC: 4.88 MIL/uL (ref 3.87–5.11)
RDW: 14.5 % (ref 11.5–15.5)
WBC: 7.5 10*3/uL (ref 4.0–10.5)

## 2011-12-12 LAB — BASIC METABOLIC PANEL
BUN: 15 mg/dL (ref 6–23)
CO2: 27 mEq/L (ref 19–32)
Calcium: 9.4 mg/dL (ref 8.4–10.5)
Chloride: 98 mEq/L (ref 96–112)
Creatinine, Ser: 0.98 mg/dL (ref 0.50–1.10)
GFR calc Af Amer: 75 mL/min — ABNORMAL LOW (ref >90)
GFR calc non Af Amer: 65 mL/min — ABNORMAL LOW (ref >90)
Glucose, Bld: 104 mg/dL — ABNORMAL HIGH (ref 70–99)
Potassium: 3.6 mEq/L (ref 3.5–5.1)
Sodium: 136 mEq/L (ref 135–145)

## 2011-12-12 LAB — HCG, SERUM, QUALITATIVE: Preg, Serum: NEGATIVE

## 2011-12-12 LAB — SURGICAL PCR SCREEN
MRSA, PCR: NEGATIVE
Staphylococcus aureus: POSITIVE — AB

## 2011-12-12 MED ORDER — CHLORHEXIDINE GLUCONATE 4 % EX LIQD: 1.0000 "application " | Freq: Once | CUTANEOUS | Status: DC

## 2011-12-12 NOTE — Progress Notes (Addendum)
Ekg, cxr in epic 57.  Mild sleep apnea no machine- study 6 yrs ago

## 2011-12-12 NOTE — Pre-Procedure Instructions (Addendum)
18 Julie Ross  12/12/2011   Your procedure is scheduled on:  12/19/11  Report to [name] at 930 AM.  Call this number if you have problems the morning of surgery: [phone]   Remember:   Do not eat food or drink:After Midnight.  .  Take these medicines the morning of surgery with A SIP OF WATER:pain med, baclofen, cymbalta, nexium, gabapentin  STOP any aspirin, nsaids, blood thinners, herbal meds   Do not wear jewelry, make-up or nail polish.  Do not wear lotions, powders, or perfumes. You may wear deodorant.  Do not shave 48 hours prior to surgery. Men may shave face and neck.  Do not bring valuables to the hospital.  Contacts, dentures or bridgework may not be worn into surgery.  Leave suitcase in the car. After surgery it may be brought to your room.  For patients admitted to the hospital, checkout time is 11:00 AM the day of discharge.   Patients discharged the day of surgery will not be allowed to drive home.  Name and phone number of your driver: [address] [phone]  Special Instructions: CHG Shower Use Special Wash: 1/2 bottle night before surgery and 1/2 bottle morning of surgery.   Please read over the following fact sheets that you were given: Pain Booklet, Coughing and Deep Breathing, MRSA Information and Surgical Site Infection Prevention

## 2011-12-18 MED ORDER — CEFAZOLIN SODIUM 1-5 GM-% IV SOLN
1.0000 g | INTRAVENOUS | Status: AC
  Administered 2011-12-19: 1 g via INTRAVENOUS
  Filled 2011-12-18: qty 50

## 2011-12-19 ENCOUNTER — Encounter (HOSPITAL_COMMUNITY): Payer: Self-pay | Admitting: Anesthesiology

## 2011-12-19 ENCOUNTER — Encounter (HOSPITAL_COMMUNITY): Payer: Self-pay

## 2011-12-19 ENCOUNTER — Ambulatory Visit (HOSPITAL_COMMUNITY): Payer: BC Managed Care – PPO | Admitting: Anesthesiology

## 2011-12-19 ENCOUNTER — Ambulatory Visit (HOSPITAL_COMMUNITY)
Admission: RE | Admit: 2011-12-19 | Discharge: 2011-12-19 | Disposition: A | Discharge status: Home / Self Care | Payer: BC Managed Care – PPO | Source: Ambulatory Visit | Attending: Surgery | Admitting: Surgery

## 2011-12-19 ENCOUNTER — Encounter (HOSPITAL_COMMUNITY)
Admission: RE | Disposition: A | Discharge status: Home / Self Care | Payer: Self-pay | Source: Ambulatory Visit | Attending: Surgery

## 2011-12-19 ENCOUNTER — Encounter (HOSPITAL_COMMUNITY): Payer: Self-pay | Admitting: Surgery

## 2011-12-19 ENCOUNTER — Ambulatory Visit (HOSPITAL_COMMUNITY)
Admission: RE | Admit: 2011-12-19 | Discharge: 2011-12-21 | Disposition: A | Discharge status: Home / Self Care | Payer: BC Managed Care – PPO | Source: Ambulatory Visit | Attending: Surgery | Admitting: Surgery

## 2011-12-19 DIAGNOSIS — C50919 Malignant neoplasm of unspecified site of unspecified female breast: Secondary | ICD-10-CM | POA: Insufficient documentation

## 2011-12-19 DIAGNOSIS — D0590 Unspecified type of carcinoma in situ of unspecified breast: Secondary | ICD-10-CM | POA: Insufficient documentation

## 2011-12-19 DIAGNOSIS — G4730 Sleep apnea, unspecified: Secondary | ICD-10-CM | POA: Insufficient documentation

## 2011-12-19 DIAGNOSIS — F3289 Other specified depressive episodes: Secondary | ICD-10-CM | POA: Insufficient documentation

## 2011-12-19 DIAGNOSIS — I10 Essential (primary) hypertension: Secondary | ICD-10-CM | POA: Insufficient documentation

## 2011-12-19 DIAGNOSIS — C773 Secondary and unspecified malignant neoplasm of axilla and upper limb lymph nodes: Secondary | ICD-10-CM | POA: Insufficient documentation

## 2011-12-19 DIAGNOSIS — C50419 Malignant neoplasm of upper-outer quadrant of unspecified female breast: Secondary | ICD-10-CM

## 2011-12-19 DIAGNOSIS — F430 Acute stress reaction: Secondary | ICD-10-CM | POA: Insufficient documentation

## 2011-12-19 DIAGNOSIS — F329 Major depressive disorder, single episode, unspecified: Secondary | ICD-10-CM | POA: Insufficient documentation

## 2011-12-19 DIAGNOSIS — E039 Hypothyroidism, unspecified: Secondary | ICD-10-CM | POA: Insufficient documentation

## 2011-12-19 HISTORY — PX: MASTECTOMY W/ SENTINEL NODE BIOPSY: SHX2001

## 2011-12-19 SURGERY — MASTECTOMY WITH SENTINEL LYMPH NODE BIOPSY
Anesthesia: General | Site: Breast | Laterality: Left | Wound class: Clean

## 2011-12-19 MED ORDER — GLYCOPYRROLATE 0.2 MG/ML IJ SOLN
INTRAMUSCULAR | Status: DC | PRN
  Administered 2011-12-19: 0.2 mg via INTRAVENOUS

## 2011-12-19 MED ORDER — MIDAZOLAM HCL 2 MG/2ML IJ SOLN
INTRAMUSCULAR | Status: AC
  Filled 2011-12-19: qty 2

## 2011-12-19 MED ORDER — DULOXETINE HCL 60 MG PO CPEP
60.0000 mg | ORAL_CAPSULE | Freq: Every day | ORAL | Status: DC
  Administered 2011-12-20 – 2011-12-21 (×2): 60 mg via ORAL
  Filled 2011-12-19 (×3): qty 1

## 2011-12-19 MED ORDER — HYDROMORPHONE HCL PF 1 MG/ML IJ SOLN
0.2500 mg | INTRAMUSCULAR | Status: DC | PRN
  Administered 2011-12-19 (×4): 0.5 mg via INTRAVENOUS

## 2011-12-19 MED ORDER — HYDROMORPHONE HCL PF 1 MG/ML IJ SOLN
2.0000 mg | INTRAMUSCULAR | Status: DC | PRN
  Administered 2011-12-19: 2 mg via INTRAVENOUS
  Administered 2011-12-20: 1 mg via INTRAVENOUS
  Administered 2011-12-20: 2 mg via INTRAVENOUS
  Filled 2011-12-19: qty 2
  Filled 2011-12-19: qty 1
  Filled 2011-12-19: qty 2

## 2011-12-19 MED ORDER — ONDANSETRON HCL 4 MG/2ML IJ SOLN: 4.0000 mg | Freq: Once | INTRAMUSCULAR | Status: DC | PRN

## 2011-12-19 MED ORDER — GABAPENTIN 300 MG PO CAPS
600.0000 mg | ORAL_CAPSULE | Freq: Three times a day (TID) | ORAL | Status: DC
  Administered 2011-12-19 – 2011-12-21 (×5): 600 mg via ORAL
  Filled 2011-12-19 (×9): qty 2

## 2011-12-19 MED ORDER — HYDROMORPHONE HCL PF 1 MG/ML IJ SOLN
INTRAMUSCULAR | Status: AC
  Filled 2011-12-19: qty 1

## 2011-12-19 MED ORDER — TECHNETIUM TC 99M SULFUR COLLOID FILTERED
1.0000 | Freq: Once | INTRAVENOUS | Status: AC | PRN
  Administered 2011-12-19: 1 via INTRADERMAL

## 2011-12-19 MED ORDER — LOSARTAN POTASSIUM-HCTZ 50-12.5 MG PO TABS: 1.0000 | ORAL_TABLET | Freq: Every day | ORAL | Status: DC

## 2011-12-19 MED ORDER — HYDROCHLOROTHIAZIDE 12.5 MG PO CAPS
12.5000 mg | ORAL_CAPSULE | Freq: Every day | ORAL | Status: DC
  Administered 2011-12-20 – 2011-12-21 (×2): 12.5 mg via ORAL
  Filled 2011-12-19 (×4): qty 1

## 2011-12-19 MED ORDER — ONDANSETRON HCL 4 MG/2ML IJ SOLN
INTRAMUSCULAR | Status: DC | PRN
  Administered 2011-12-19: 4 mg via INTRAVENOUS

## 2011-12-19 MED ORDER — CLONAZEPAM 0.5 MG PO TABS
0.5000 mg | ORAL_TABLET | Freq: Every evening | ORAL | Status: DC | PRN
Start: 2011-12-19 — End: 2011-12-21

## 2011-12-19 MED ORDER — SODIUM CHLORIDE 0.9 % IJ SOLN
INTRAMUSCULAR | Status: DC | PRN
  Administered 2011-12-19: 12:00:00 via INTRAMUSCULAR

## 2011-12-19 MED ORDER — FENTANYL CITRATE 0.05 MG/ML IJ SOLN
100.0000 ug | INTRAMUSCULAR | Status: DC | PRN
  Administered 2011-12-19: 100 ug via INTRAVENOUS

## 2011-12-19 MED ORDER — LACTATED RINGERS IV SOLN: INTRAVENOUS | Status: DC

## 2011-12-19 MED ORDER — PHENYLEPHRINE HCL 10 MG/ML IJ SOLN
INTRAMUSCULAR | Status: DC | PRN
  Administered 2011-12-19 (×10): 80 ug via INTRAVENOUS

## 2011-12-19 MED ORDER — METHYLENE BLUE 1 % INJ SOLN
INTRAMUSCULAR | Status: AC
  Filled 2011-12-19: qty 10

## 2011-12-19 MED ORDER — MIDAZOLAM HCL 2 MG/2ML IJ SOLN
2.0000 mg | Freq: Once | INTRAMUSCULAR | Status: AC
  Administered 2011-12-19: 2 mg via INTRAVENOUS

## 2011-12-19 MED ORDER — PANTOPRAZOLE SODIUM 40 MG PO TBEC
40.0000 mg | DELAYED_RELEASE_TABLET | Freq: Every day | ORAL | Status: DC
  Administered 2011-12-20 – 2011-12-21 (×2): 40 mg via ORAL
  Filled 2011-12-19 (×3): qty 1

## 2011-12-19 MED ORDER — LACTATED RINGERS IV SOLN
INTRAVENOUS | Status: DC | PRN
  Administered 2011-12-19: 1000 mL
  Administered 2011-12-19 (×2): via INTRAVENOUS

## 2011-12-19 MED ORDER — DEXAMETHASONE SODIUM PHOSPHATE 4 MG/ML IJ SOLN
INTRAMUSCULAR | Status: DC | PRN
  Administered 2011-12-19: 8 mg via INTRAVENOUS

## 2011-12-19 MED ORDER — BACLOFEN 10 MG PO TABS
10.0000 mg | ORAL_TABLET | Freq: Three times a day (TID) | ORAL | Status: DC
  Administered 2011-12-19 – 2011-12-21 (×6): 10 mg via ORAL
  Filled 2011-12-19 (×9): qty 1

## 2011-12-19 MED ORDER — FENTANYL CITRATE 0.05 MG/ML IJ SOLN
INTRAMUSCULAR | Status: DC | PRN
  Administered 2011-12-19: 100 ug via INTRAVENOUS
  Administered 2011-12-19: 50 ug via INTRAVENOUS

## 2011-12-19 MED ORDER — LOSARTAN POTASSIUM 50 MG PO TABS
50.0000 mg | ORAL_TABLET | Freq: Every day | ORAL | Status: DC
  Administered 2011-12-20 – 2011-12-21 (×2): 50 mg via ORAL
  Filled 2011-12-19 (×4): qty 1

## 2011-12-19 MED ORDER — LIDOCAINE HCL (CARDIAC) 20 MG/ML IV SOLN
INTRAVENOUS | Status: DC | PRN
  Administered 2011-12-19: 60 mg via INTRAVENOUS

## 2011-12-19 MED ORDER — FENTANYL CITRATE 0.05 MG/ML IJ SOLN
INTRAMUSCULAR | Status: AC
Start: 2011-12-19 — End: 2011-12-19
  Filled 2011-12-19: qty 2

## 2011-12-19 MED ORDER — EPHEDRINE SULFATE 50 MG/ML IJ SOLN
INTRAMUSCULAR | Status: DC | PRN
  Administered 2011-12-19: 10 mg via INTRAVENOUS
  Administered 2011-12-19: 5 mg via INTRAVENOUS
  Administered 2011-12-19 (×2): 10 mg via INTRAVENOUS
  Administered 2011-12-19 (×3): 5 mg via INTRAVENOUS

## 2011-12-19 MED ORDER — HYDROCODONE-ACETAMINOPHEN 5-325 MG PO TABS
1.0000 | ORAL_TABLET | ORAL | Status: DC | PRN
  Administered 2011-12-19: 1 via ORAL
  Filled 2011-12-19: qty 1
  Filled 2011-12-19: qty 2

## 2011-12-19 MED ORDER — PROPOFOL 10 MG/ML IV BOLUS
INTRAVENOUS | Status: DC | PRN
  Administered 2011-12-19: 200 mg via INTRAVENOUS

## 2011-12-19 SURGICAL SUPPLY — 58 items
ADH SKN CLS APL DERMABOND .7 (GAUZE/BANDAGES/DRESSINGS) ×2
APPLIER CLIP 9.375 MED OPEN (MISCELLANEOUS) ×2
APPLIER CLIP 9.375 SM OPEN (CLIP)
APR CLP MED 9.3 20 MLT OPN (MISCELLANEOUS) ×1
APR CLP SM 9.3 20 MLT OPN (CLIP)
BINDER BREAST LRG (GAUZE/BANDAGES/DRESSINGS) ×1 IMPLANT
BINDER BREAST XLRG (GAUZE/BANDAGES/DRESSINGS) IMPLANT
BIOPATCH WHT 1IN DISK W/4.0 H (GAUZE/BANDAGES/DRESSINGS) ×1 IMPLANT
CANISTER SUCTION 2500CC (MISCELLANEOUS) ×2 IMPLANT
CHLORAPREP W/TINT 26ML (MISCELLANEOUS) ×2 IMPLANT
CLIP APPLIE 9.375 MED OPEN (MISCELLANEOUS) ×1 IMPLANT
CLIP APPLIE 9.375 SM OPEN (CLIP) IMPLANT
CLOTH BEACON ORANGE TIMEOUT ST (SAFETY) ×2 IMPLANT
CONT SPEC 4OZ CLIKSEAL STRL BL (MISCELLANEOUS) ×5 IMPLANT
COVER PROBE W GEL 5X96 (DRAPES) ×2 IMPLANT
COVER SURGICAL LIGHT HANDLE (MISCELLANEOUS) ×2 IMPLANT
DERMABOND ADVANCED (GAUZE/BANDAGES/DRESSINGS) ×2
DERMABOND ADVANCED .7 DNX12 (GAUZE/BANDAGES/DRESSINGS) ×1 IMPLANT
DRAIN CHANNEL 19F RND (DRAIN) ×2 IMPLANT
DRAPE LAPAROSCOPIC ABDOMINAL (DRAPES) ×2 IMPLANT
DRAPE ORTHO SPLIT 77X108 STRL (DRAPES)
DRAPE SURG ORHT 6 SPLT 77X108 (DRAPES) IMPLANT
DRAPE UTILITY 15X26 W/TAPE STR (DRAPE) ×4 IMPLANT
DRSG ADAPTIC 3X8 NADH LF (GAUZE/BANDAGES/DRESSINGS) ×2 IMPLANT
DRSG PAD ABDOMINAL 8X10 ST (GAUZE/BANDAGES/DRESSINGS) ×2 IMPLANT
ELECT BLADE 4.0 EZ CLEAN MEGAD (MISCELLANEOUS) ×2
ELECT CAUTERY BLADE 6.4 (BLADE) ×2 IMPLANT
ELECT REM PT RETURN 9FT ADLT (ELECTROSURGICAL) ×2
ELECTRODE BLDE 4.0 EZ CLN MEGD (MISCELLANEOUS) ×1 IMPLANT
ELECTRODE REM PT RTRN 9FT ADLT (ELECTROSURGICAL) ×1 IMPLANT
EVACUATOR SILICONE 100CC (DRAIN) ×2 IMPLANT
GLOVE BIOGEL PI IND STRL 7.5 (GLOVE) IMPLANT
GLOVE BIOGEL PI INDICATOR 7.5 (GLOVE) ×1
GLOVE EUDERMIC 7 POWDERFREE (GLOVE) ×2 IMPLANT
GLOVE SURG SS PI 7.0 STRL IVOR (GLOVE) ×1 IMPLANT
GOWN PREVENTION PLUS XLARGE (GOWN DISPOSABLE) ×3 IMPLANT
GOWN STRL NON-REIN LRG LVL3 (GOWN DISPOSABLE) ×3 IMPLANT
KIT BASIN OR (CUSTOM PROCEDURE TRAY) ×2 IMPLANT
KIT ROOM TURNOVER OR (KITS) ×2 IMPLANT
NDL 18GX1X1/2 (RX/OR ONLY) (NEEDLE) ×1 IMPLANT
NDL HYPO 25GX1X1/2 BEV (NEEDLE) ×1 IMPLANT
NEEDLE 18GX1X1/2 (RX/OR ONLY) (NEEDLE) ×2 IMPLANT
NEEDLE HYPO 25GX1X1/2 BEV (NEEDLE) ×2 IMPLANT
NS IRRIG 1000ML POUR BTL (IV SOLUTION) ×2 IMPLANT
PACK GENERAL/GYN (CUSTOM PROCEDURE TRAY) ×2 IMPLANT
PAD ARMBOARD 7.5X6 YLW CONV (MISCELLANEOUS) ×4 IMPLANT
PEN SKIN MARKING BROAD (MISCELLANEOUS) ×2 IMPLANT
SPECIMEN JAR MEDIUM (MISCELLANEOUS) ×1 IMPLANT
SPECIMEN JAR X LARGE (MISCELLANEOUS) ×1 IMPLANT
SPONGE GAUZE 4X4 12PLY (GAUZE/BANDAGES/DRESSINGS) ×3 IMPLANT
SPONGE LAP 4X18 X RAY DECT (DISPOSABLE) ×2 IMPLANT
STAPLER VISISTAT 35W (STAPLE) ×2 IMPLANT
SUT ETHILON 2 0 FS 18 (SUTURE) ×2 IMPLANT
SUT MON AB 4-0 PC3 18 (SUTURE) ×2 IMPLANT
SUT VIC AB 3-0 SH 18 (SUTURE) ×3 IMPLANT
SYR CONTROL 10ML LL (SYRINGE) ×2 IMPLANT
TOWEL OR 17X24 6PK STRL BLUE (TOWEL DISPOSABLE) ×2 IMPLANT
TOWEL OR 17X26 10 PK STRL BLUE (TOWEL DISPOSABLE) ×2 IMPLANT

## 2011-12-19 NOTE — Progress Notes (Signed)
Pt admitted to [facility] room 6. S/P left mastectomy. Dressing with binder clean, dry and intact. JP drain x1. IV fluids infusing. Patient and husband oriented to room. Call bell in reach

## 2011-12-19 NOTE — Transfer of Care (Signed)
Immediate Anesthesia Transfer of Care Note  Patient: Julie Ross  Procedure(s) Performed: Procedure(s) (LRB): MASTECTOMY WITH SENTINEL LYMPH NODE BIOPSY (Left)  Patient Location: PACU  Anesthesia Type: General  Level of Consciousness: awake, alert  and oriented  Airway & Oxygen Therapy: Patient Spontanous Breathing and Patient connected to nasal cannula oxygen  Post-op Assessment: Report given to PACU RN and Post -op Vital signs reviewed and stable  Post vital signs: Reviewed and stable  Complications: No apparent anesthesia complications

## 2011-12-19 NOTE — H&P (View-Only) (Signed)
Patient ID: Julie Ross, female   DOB: 17-Aug-1960, 51 y.o.   MRN: [id number]  Chief Complaint  Patient presents with  . Breast Cancer    Left    HPI Julie Ross is a 51 y.o. female.  She recently had a mammogram which showed an abnormality. A needle core biopsy has shown DCIS. It was ER and PR receptor positive at 100% for each. She is having no prior breast symptoms. She got a significant hematoma or bruise after the biopsy which is improving. She had an MRI done which confirms a 7 cm area of abnormality in the right breast was a nodular density near one end is that it has not been biopsied. There is no evidence of lymph node metastasis. HPI  Past Medical History  Diagnosis Date  . Hypertension   . Sleep apnea     MILD SLEEP APNEA, NO MACHINE  6 YRS AGO HPT [facility]   . Hypothyroidism   . Arthritis   . Neuromuscular disorder     MS TX WITH CYMBALTA   . Multiple sclerosis   . H/O colonoscopy   . H/O bone density study 03/2011  . Wears glasses   . Breast cancer   . Heart burn   . Depression     Past Surgical History  Procedure Date  . Tonsillectomy   . Tumor removal     FROM PELVIS AGE   . Cesarean section     X2   . No past surgeries     BLADDER SLING  4-5 YRS AGO   . Knee arthroscopy     LT KNEE 04/2011  . Total knee arthroplasty 08/15/2011    Procedure: TOTAL KNEE ARTHROPLASTY;  Surgeon: Harvie Junior, MD;  Location: [facility] OR;  Service: Orthopedics;  Laterality: Left;  RIGHT KNEE CORTIZONE INJECTION  . Mohs surgery     Family History  Problem Relation Age of Onset  . Breast cancer Paternal Aunt   . Breast cancer Maternal Grandmother     Social History History  Substance Use Topics  . Smoking status: Never Smoker   . Smokeless tobacco: Not on file  . Alcohol Use: No    No Known Allergies  Current Outpatient Prescriptions  Medication Sig Dispense Refill  . baclofen (LIORESAL) 10 MG tablet Take 10 mg by mouth 3 (three) times daily.      Marland Kitchen  CALCIUM-MAGNESIUM PO Take 1 tablet by mouth daily.      . cholecalciferol (VITAMIN D) 1000 UNITS tablet Take 4,000 Units by mouth daily.      . clonazePAM (KLONOPIN) 0.5 MG tablet Take 0.5 mg by mouth at bedtime as needed. For sleep      . DULoxetine (CYMBALTA) 60 MG capsule Take 60 mg by mouth daily.      Marland Kitchen esomeprazole (NEXIUM) 40 MG capsule Take 40 mg by mouth daily before breakfast.      . gabapentin (NEURONTIN) 300 MG capsule Take 300 mg by mouth 3 (three) times daily.      Marland Kitchen HYDROcodone-acetaminophen (NORCO) 5-325 MG per tablet Take 1 tablet by mouth as needed.      Marland Kitchen levothyroxine (SYNTHROID, LEVOTHROID) 25 MCG tablet Take 25 mcg by mouth daily.      Marland Kitchen losartan-hydrochlorothiazide (HYZAAR) 50-12.5 MG per tablet Take 1 tablet by mouth daily.      . methocarbamol (ROBAXIN-750) 750 MG tablet One q 8hrs prn spasm.  40 tablet  0  . Multiple Vitamins-Minerals (MULTIVITAMINS THER. W/MINERALS) TABS  Take 1 tablet by mouth daily.      . naproxen sodium (ANAPROX) 550 MG tablet Take 550 mg by mouth as needed.      . traMADol (ULTRAM) 50 MG tablet Take 100 mg by mouth every 6 (six) hours as needed. For pain      . warfarin (COUMADIN) 5 MG tablet One tablet daily unless otherwise directed. Take x 1 month post op.  30 tablet  0    Review of Systems Review of Systems  Constitutional: Positive for fatigue. Negative for fever, chills and unexpected weight change.  HENT: Negative for hearing loss, congestion, sore throat, trouble swallowing and voice change.   Eyes: Negative for visual disturbance.  Respiratory: Negative for cough and wheezing.   Cardiovascular: Negative for chest pain, palpitations and leg swelling.  Gastrointestinal: Negative for nausea, vomiting, abdominal pain, diarrhea, constipation, blood in stool, abdominal distention and anal bleeding.       Heartburn   Genitourinary: Negative for hematuria, vaginal bleeding and difficulty urinating.  Musculoskeletal: Negative for arthralgias.   Skin: Negative for rash and wound.  Neurological: Negative for seizures, syncope and headaches.  Hematological: Negative for adenopathy. Bruises/bleeds easily.  Psychiatric/Behavioral: Negative for confusion.       Depression Forgetfulness     Blood pressure 171/96, pulse 87, temperature 97.9 F (36.6 C), resp. rate 20, height 5\' 6"  (1.676 m), weight 201 lb (91.173 kg).  Physical Exam Physical Exam  Vitals reviewed. Constitutional: She is oriented to person, place, and time. She appears well-developed and well-nourished. No distress.  HENT:  Head: Normocephalic and atraumatic.  Mouth/Throat: Oropharynx is clear and moist.  Eyes: Conjunctivae and EOM are normal. Pupils are equal, round, and reactive to light. No scleral icterus.  Neck: Normal range of motion. Neck supple. No tracheal deviation present. No thyromegaly present.  Cardiovascular: Normal rate, regular rhythm, normal heart sounds and intact distal pulses.  Exam reveals no gallop and no friction rub.   No murmur heard. Pulmonary/Chest: Effort normal and breath sounds normal. No respiratory distress. She has no wheezes. She has no rales.         Bruising laterally as diagrammed.  Abdominal: Soft. Bowel sounds are normal. She exhibits no distension and no mass. There is no tenderness. There is no rebound and no guarding.  Musculoskeletal: Normal range of motion. She exhibits no edema and no tenderness.  Neurological: She is alert and oriented to person, place, and time.  Skin: Skin is warm and dry. No rash noted. She is not diaphoretic. No erythema.  Psychiatric: She has a normal mood and affect. Her behavior is normal. Judgment and thought content normal.    Data Reviewed Mammogram showed increasing microcalcifications in the left upper outer quadrant very in size and shape with some linear forms. Ultrasound showed no worrisome mass. Postbiopsy MRI showed a 6 x 9 x 7 cm area of linear and stippled enhancement throughout  the central and upper-outer left breast correlating with the biopsy proven DCIS. This enhancement compost a few nodular areas including a 7 mm mass anteriorly at the 6:00 position of the left breast no abnormal lymph nodes were identified.  Assessment    1. Clinical stage 0 left breast cancer upper outer quadrant, receptor positive 2. Multiple sclerosis 3. Status post total knee replacement February/2013 4. History of skin cancer both basal cell and squamous cell     Plan    I think she will need a mastectomy given the large area of abnormality on  the MRI correlating with the calcifications on mammogram. We could attempt to do a lumpectomy with bracketing of the area but I think we would end up with significant breast deformity. I think was a mastectomy she will need a sentinel node. I think this is likely mainly DCIS and noninvasive disease so she would be a potential candidate for immediate reconstruction but I have not yet spoken with her radiation therapist about his thoughts.  I have explained the pathophysiology and staging of breast cancer with particular attention to her exact situation. We discussed the multidisciplinary approach to breast cancer which often includes both medical and radiation oncology consultations.  We also discussed surgical options for the treatment of breast cancer including lumpectomy and mastectomy with possible reconstructive surgery. In addition we talked about the evaluation and management of lymph nodes including a description of sentinel lymph node biopsy and axillary dissections. We reviewed potential complications and risks including bleeding, infection, numbness,  lymphedema, and the potential need for additional surgery.  She understands that for patients who are candidate for lumpectomy or mastectomy there is an equal survival rate with either technique, but a slightly higher local recurrence rate with lumpectomy. In addition she knows that a lumpectomy  usually requires postoperative radiation as part of the management of the breast cancer.  We have discussed the likely postoperative course and plans for followup.  I have given the patient some written information that reviewed all of these issues. I believe her questions are answered and that she has a good understanding of the issues.  The patient will be seen later this afternoon by the medical and radiation oncologists and we can make definitive plans once that is done.        Torsten Weniger J 11/30/2011, 1:38 PM

## 2011-12-19 NOTE — Preoperative (Signed)
Beta Blockers   Reason not to administer Beta Blockers:Not Applicable 

## 2011-12-19 NOTE — Anesthesia Preprocedure Evaluation (Addendum)
Anesthesia Evaluation  Patient identified by MRN, date of birth, ID band Patient awake    Reviewed: Allergy & Precautions, H&P , NPO status , Patient's Chart, lab work & pertinent test results  History of Anesthesia Complications Negative for: history of anesthetic complications  Airway Mallampati: I TM Distance: >3 FB Neck ROM: Full    Dental  (+) Teeth Intact and Dental Advisory Given   Pulmonary sleep apnea ,  breath sounds clear to auscultation        Cardiovascular hypertension, Pt. on medications Rhythm:Regular Rate:Normal     Neuro/Psych Depression  Neuromuscular disease (Hx of MS since 1998; tolerates drugs for it poorly.)    GI/Hepatic Neg liver ROS, GERD-  Medicated and Controlled,  Endo/Other  Hypothyroidism Morbid obesity  Renal/GU negative Renal ROS     Musculoskeletal negative musculoskeletal ROS (+)   Abdominal   Peds  Hematology negative hematology ROS (+)   Anesthesia Other Findings   Reproductive/Obstetrics negative OB ROS                         Anesthesia Physical Anesthesia Plan  ASA: III  Anesthesia Plan: General   Post-op Pain Management:    Induction: Intravenous  Airway Management Planned: LMA  Additional Equipment:   Intra-op Plan:   Post-operative Plan: Extubation in OR  Informed Consent: I have reviewed the patients History and Physical, chart, labs and discussed the procedure including the risks, benefits and alternatives for the proposed anesthesia with the patient or authorized representative who has indicated his/her understanding and acceptance.   Dental advisory given  Plan Discussed with: CRNA, Anesthesiologist and Surgeon  Anesthesia Plan Comments:         Anesthesia Quick Evaluation

## 2011-12-19 NOTE — Interval H&P Note (Signed)
History and Physical Interval Note:  12/19/2011 10:49 AM  Julie Ross  has presented today for surgery, with the diagnosis of Left breast cancer  The various methods of treatment have been discussed with the patient and family. After consideration of risks, benefits and other options for treatment, the patient has consented to  Procedure(s) (LRB): MASTECTOMY WITH SENTINEL LYMPH NODE BIOPSY (Left) as a surgical intervention .  The patient's history has been reviewed, patient examined, no change in status, stable for surgery.  I have reviewed the patients' chart and labs.  Questions were answered to the patient's satisfaction.   The left breast was marked as the operative site  [name]

## 2011-12-19 NOTE — Anesthesia Postprocedure Evaluation (Signed)
  Anesthesia Post-op Note  Patient: Julie Ross  Procedure(s) Performed: Procedure(s) (LRB): MASTECTOMY WITH SENTINEL LYMPH NODE BIOPSY (Left)  Patient Location: PACU  Anesthesia Type: General  Level of Consciousness: awake and alert   Airway and Oxygen Therapy: Patient Spontanous Breathing and Patient connected to nasal cannula oxygen  Post-op Pain: mild  Post-op Assessment: Post-op Vital signs reviewed  Post-op Vital Signs: Reviewed  Complications: No apparent anesthesia complications

## 2011-12-19 NOTE — Op Note (Signed)
Julie Ross 1960/12/07 [id number] 12/01/2011  Preoperative diagnosis: Extensive DCIS left breast upper outer quadrant, clinical stage 0  Postoperative diagnosis: Same  Procedure: Left total mastectomy with blue dye injection and left axillary sentinel lymph node excision  Surgeon: Currie Paris   Assistant surgeon: Charissa Bash. MS III  Anesthesia:General  Clinical History and Indications:The patient is seen in the holding area and we reviewed the plans for the procedure as noted above. We reviewed the risks and complications a final time. She had no further questions. I marked theleft side as the operative side.  Description of Procedure: The patient was taken to the operating room. After satisfactory general anesthesia was obtained the timeout was done.   I then injected 5 cc of dilute methylene blue and injected it subareaorly and massaged it in. A full prep and drape was then done.  I outlined an elliptical incision and marked the inframammary fold and midline. The incision was made. The usual skin flaps were raised. I went to the clavicle superiorly and sternum medially and towards the latissimus laterally.    After the superior flap was complete I was able to use the neoprobe to locate a sentinel node.  A cluster of 4 lymph nodes was identified almost immediately and removed. They all had counts ranging from about [phone]. 3 of the 4 were blue.  Once the nodes were removed they wereforwarded to pathology.   The inferior flap was then made going to the inframammary fold and out to the latissimus. The breast was then removed from the pectoralis starting medially and working laterally. When I got to the lateral aspect of the pectoralis major muscle I opened the clavipectoral fascia. I finished removing the breast from the serratus muscles.    I therefore completed the mastectomy by dividing the remaining attachments from the breast to the chest wall and latissimus but  not taking any more tissue from the axilla. The specimen was marked to orient it for the pathologist and passed off the table. At this point the pathologist reported on the sentinel node and that it was negative.  I spent several minutes irrigating and making sure everything was dry. I then placed a 35 [place] Blake drain through a small incision in the inferior flap and laid along the pectoralis muscle. It was secured with a 2-0 nylon suture.  Another irrigation and check for hemostasis was made. Everything appeared to be dry. Incision was then closed with interrupted 3-0 Vicryl followed by running 4-0 Monocryl subcuticular and Dermabond. The patient tolerated the procedure well. There were no operative complications. All counts were correct. Estimated blood loss was100cc. Sterile dressings were applied and the patient taken to the PACU in satisfactory condition.  Currie Paris, MD, FACS 12/19/2011 1:16 PM

## 2011-12-20 ENCOUNTER — Encounter (HOSPITAL_COMMUNITY): Payer: Self-pay | Admitting: Surgery

## 2011-12-20 MED ORDER — HYDROCODONE-ACETAMINOPHEN 5-325 MG PO TABS
1.0000 | ORAL_TABLET | ORAL | Status: DC | PRN
  Administered 2011-12-20 – 2011-12-21 (×2): 2 via ORAL
  Filled 2011-12-20: qty 2

## 2011-12-20 NOTE — Progress Notes (Signed)
1 Day Post-Op  Subjective: Mild nausea, doesn't feel too well, pain controlled  Objective: Vital signs in last 24 hours: Temp:  [97.6 F (36.4 C)-98.3 F (36.8 C)] 97.8 F (36.6 C) (07/02 0538) Pulse Rate:  [53-98] 61  (07/02 0538) Resp:  [7-20] 16  (07/02 0538) BP: (114-144)/(51-78) 144/51 mmHg (07/02 0538) SpO2:  [85 %-100 %] 99 % (07/02 0538) Weight:  [198 lb 3.1 oz (89.9 kg)] 198 lb 3.1 oz (89.9 kg) (07/01 1630)   Intake/Output from previous day: 07/01 0701 - 07/02 0700 In: 2095 [P.O.:300; I.V.:1795] Out: 1410 [Urine:1175; Drains:160; Blood:75] Intake/Output this shift:     General appearance: alert, cooperative, fatigued and no distress Resp: clear to auscultation bilaterally  Incision: Dressing dry, no evidence hematoma - JP pink  Lab Results:  No results found for this basename: WBC:2,HGB:2,HCT:2,PLT:2 in the last 72 hours BMET No results found for this basename: NA:2,K:2,CL:2,CO2:2,GLUCOSE:2,BUN:2,CREATININE:2,CALCIUM:2 in the last 72 hours PT/INR No results found for this basename: LABPROT:2,INR:2 in the last 72 hours ABG No results found for this basename: PHART:2,PCO2:2,PO2:2,HCO3:2 in the last 72 hours  MEDS, Scheduled    . baclofen  10 mg Oral TID  .  ceFAZolin (ANCEF) IV  1 g Intravenous 60 min Pre-Op  . DULoxetine  60 mg Oral Daily  . gabapentin  600 mg Oral TID  . losartan  50 mg Oral Daily   And  . hydrochlorothiazide  12.5 mg Oral Daily  . HYDROmorphone      . HYDROmorphone      . midazolam  2 mg Intravenous Once  . pantoprazole  40 mg Oral Daily  . DISCONTD: losartan-hydrochlorothiazide  1 tablet Oral Daily    Studies/Results: Nm Sentinel Node Inj-no Rpt (breast)  12/19/2011  CLINICAL DATA: breast cancer   Sulfur colloid was injected intradermally by the nuclear medicine  technologist for breast cancer sentinel node localization.      Assessment: s/p Procedure(s): MASTECTOMY WITH SENTINEL LYMPH NODE BIOPSY Stable POD #1  Plan: Will  keep today to allow nausea to clear. If feels better after lunch might be able to go, else tomorrow   LOS: 1 day     Currie Paris, MD, [facility], [place] [phone]   12/20/2011 7:56 AM

## 2011-12-21 MED ORDER — HYDROCODONE-ACETAMINOPHEN 5-325 MG PO TABS: 1.0000 | ORAL_TABLET | ORAL | Status: AC | PRN

## 2011-12-21 NOTE — Progress Notes (Signed)
2 Days Post-Op  Subjective: Feels better and able to go home. Notes some "swelling" at top of superior flap. Pain controlled, nausea resolved  Objective: Vital signs in last 24 hours: Temp:  [97.8 F (36.6 C)-98.7 F (37.1 C)] 97.8 F (36.6 C) (07/02 2147) Pulse Rate:  [62-74] 66  (07/02 2147) Resp:  [15-16] 16  (07/02 2147) BP: (121-127)/(46-56) 125/46 mmHg (07/02 2147) SpO2:  [97 %-99 %] 99 % (07/02 2147)   Intake/Output from previous day: 07/02 0701 - 07/03 0700 In: 600 [P.O.:600] Out: 20 [Drains:20] Intake/Output this shift:     General appearance: alert, cooperative and no distress  Incision: Jap thin, no hematoma  Lab Results:  No results found for this basename: WBC:2,HGB:2,HCT:2,PLT:2 in the last 72 hours BMET No results found for this basename: NA:2,K:2,CL:2,CO2:2,GLUCOSE:2,BUN:2,CREATININE:2,CALCIUM:2 in the last 72 hours PT/INR No results found for this basename: LABPROT:2,INR:2 in the last 72 hours ABG No results found for this basename: PHART:2,PCO2:2,PO2:2,HCO3:2 in the last 72 hours  MEDS, Scheduled    . baclofen  10 mg Oral TID  . DULoxetine  60 mg Oral Daily  . gabapentin  600 mg Oral TID  . losartan  50 mg Oral Daily   And  . hydrochlorothiazide  12.5 mg Oral Daily  . pantoprazole  40 mg Oral Daily    Studies/Results: Nm Sentinel Node Inj-no Rpt (breast)  12/19/2011  CLINICAL DATA: breast cancer   Sulfur colloid was injected intradermally by the nuclear medicine  technologist for breast cancer sentinel node localization.      Assessment: s/p Procedure(s): MASTECTOMY WITH SENTINEL LYMPH NODE BIOPSY Stabel and bale to go home   Plan: Discharge   LOS: 2 days     Currie Paris, MD, [facility], [place] [phone]   12/21/2011 7:23 AM

## 2011-12-21 NOTE — Progress Notes (Signed)
1100 Discharge instructions completed with patient and husband, including discharge meds, follow up care and JP drain care.  Patient and husband verbalize understanding, provided JP care demonstration with no further questions.  Patient discharged to home in stable condition.

## 2011-12-23 ENCOUNTER — Telehealth (INDEPENDENT_AMBULATORY_CARE_PROVIDER_SITE_OTHER): Payer: Self-pay | Admitting: Surgery

## 2011-12-26 NOTE — Discharge Summary (Signed)
  Patient ID: Julie Ross [id number] 51 y.o. 01/04/1961  12/19/2011  Discharge date and time: 12/21/2011 11:45 AM  Admitting Physician: Currie Paris  Discharge Physician: Currie Paris  Admission Diagnoses: Left breast cancer  Discharge Diagnoses: Left breast cancer, invasive ductal, multifocal, with extensive DCIS, stage II  Operations: Procedure(s): MASTECTOMY WITH SENTINEL LYMPH NODE BIOPSY    Discharged Condition: good    Hospital Course: The patient was kept in the hospital 2 postoperative days following mastectomy. She had an uneventful course. At the day of discharge she was doing well, tolerating diet, pain control, and serous drainage from her drain.  Consults: None  Significant Diagnostic Studies: Pathology showed 2 small foci of invasive ductal carcinoma with associated DCIS. One out of 4 lymph nodes showed metastatic carcinoma.  Treatments: surgery: Mastectomy as noted above  Disposition: Discharged home

## 2011-12-27 ENCOUNTER — Telehealth (INDEPENDENT_AMBULATORY_CARE_PROVIDER_SITE_OTHER): Payer: Self-pay | Admitting: General Surgery

## 2011-12-27 ENCOUNTER — Telehealth (INDEPENDENT_AMBULATORY_CARE_PROVIDER_SITE_OTHER): Payer: Self-pay | Admitting: Surgery

## 2011-12-27 ENCOUNTER — Encounter (INDEPENDENT_AMBULATORY_CARE_PROVIDER_SITE_OTHER): Payer: BC Managed Care – PPO | Admitting: Surgery

## 2011-12-27 NOTE — Telephone Encounter (Signed)
Called to tell her of path reporrt

## 2011-12-27 NOTE — Telephone Encounter (Signed)
Discussed path report with her today

## 2011-12-27 NOTE — Telephone Encounter (Signed)
Dr Jamey Ripa- if you could call patient again when you are able. Thanks.

## 2011-12-27 NOTE — Telephone Encounter (Signed)
Message copied by Liliana Cline on Tue Dec 27, 2011  7:40 AM ------      Message from: Lenise Herald      Created: Mon Dec 26, 2011 12:04 PM      Contact: Patient       Patient stated she had mastectomy w/sentinel lymph node bx on 12/19/11. She wanted to know what the pathology results are if in yet. Call back # [phone].

## 2011-12-29 ENCOUNTER — Ambulatory Visit (INDEPENDENT_AMBULATORY_CARE_PROVIDER_SITE_OTHER): Payer: BC Managed Care – PPO | Admitting: Surgery

## 2011-12-29 ENCOUNTER — Encounter (INDEPENDENT_AMBULATORY_CARE_PROVIDER_SITE_OTHER): Payer: Self-pay | Admitting: Surgery

## 2011-12-29 VITALS — BP 152/98 | HR 92 | Temp 99.1°F | Resp 18 | Ht 66.0 in | Wt 198.8 lb

## 2011-12-29 DIAGNOSIS — C50419 Malignant neoplasm of upper-outer quadrant of unspecified female breast: Secondary | ICD-10-CM

## 2011-12-29 NOTE — Progress Notes (Signed)
SUJEY GUNDRY    [id number] 12/29/2011    05/09/1961   CC: Post op Left mastectomy with axillary sentinel lymph node excision  HPI: The patient returns for post op follow-up. She underwent a Left total mastectomy with 4 sentinel lymph nodes being removed on 12/19/2011. Over all she feels that she is doing well. She is continued to have over 100 cc of serous drainage.  PE: VITAL SIGNS: BP 152/98  Pulse 92  Temp 99.1 F (37.3 C) (Temporal)  Resp 18  Ht 5\' 6"  (1.676 m)  Wt 198 lb 12.8 oz (90.175 kg)  BMI 32.09 kg/m2  LMP 10/13/2011  The incision is healing nicely and there is no evidence of infection or hematoma.  The drains are Left in situ since there is still drainage.Marland Kitchen  DATA REVIEWED: Pathology report showed 2 foci of invasive ductal carcinoma, 0.6 and 0.8 cm. One of 4 lymph nodes was positive.  IMPRESSION: Patient doing well. Drains diet ready to be removed  PLAN: Her next visit will be in 2 weeks to see me but sooner if the drainage slows down to under 35 cc. She is a medical oncology appointment tomorrow. I discussed her pathology report with her and reviewed it. I gave her a copy.Marland Kitchen

## 2011-12-29 NOTE — Patient Instructions (Signed)
See me again in 2 weeks. If the drain slow stand to less than 30 cc in a 24-hour period call the office and we will have you come over to have the drain removed.

## 2011-12-30 ENCOUNTER — Other Ambulatory Visit: Payer: BC Managed Care – PPO

## 2011-12-30 ENCOUNTER — Encounter: Payer: Self-pay | Admitting: Oncology

## 2011-12-30 ENCOUNTER — Ambulatory Visit (HOSPITAL_BASED_OUTPATIENT_CLINIC_OR_DEPARTMENT_OTHER): Payer: BC Managed Care – PPO | Admitting: Oncology

## 2011-12-30 VITALS — BP 144/75 | HR 106 | Temp 98.5°F | Ht 66.0 in | Wt 200.2 lb

## 2011-12-30 DIAGNOSIS — C50419 Malignant neoplasm of upper-outer quadrant of unspecified female breast: Secondary | ICD-10-CM

## 2011-12-30 MED ORDER — DEXAMETHASONE 4 MG PO TABS: ORAL_TABLET | ORAL | Status: DC

## 2011-12-30 MED ORDER — LORAZEPAM 0.5 MG PO TABS: 0.5000 mg | ORAL_TABLET | Freq: Four times a day (QID) | ORAL | Status: DC | PRN

## 2011-12-30 MED ORDER — ONDANSETRON HCL 8 MG PO TABS: ORAL_TABLET | ORAL | Status: DC

## 2011-12-30 MED ORDER — PROCHLORPERAZINE MALEATE 10 MG PO TABS: 10.0000 mg | ORAL_TABLET | Freq: Four times a day (QID) | ORAL | Status: DC | PRN

## 2011-12-30 MED ORDER — PROCHLORPERAZINE 25 MG RE SUPP: 25.0000 mg | Freq: Two times a day (BID) | RECTAL | Status: DC | PRN

## 2011-12-30 NOTE — Patient Instructions (Addendum)
1. TCH combination chemotherapy   2. Anti-nausea medications to your pharmacy.  3. Echocardiogram  And cardiology consult  4. Port placement by Dr. Jamey Ripa  5. Chemo to begin on 01/20/11 with MD visit and labs

## 2011-12-30 NOTE — Progress Notes (Signed)
OFFICE PROGRESS NOTE  CC  Julie Ross [address] [place] [address] Dr. Cyndia Bent Dr. Richardean Chimera Dr. Mardella Layman Dr. Gustavus Messing ([facility])  DIAGNOSIS: 50 year old female who originally was seen in the multidisciplinary breast clinic for discussion of new diagnosis of left breast cancer. She is now status post left mastectomy with the final pathology revealing an invasive ductal carcinoma that is ER positive PR positive HER-2/neu positive with Ki-67 11%.  PRIOR THERAPY:  #1 patient was originally seen in the multidisciplinary breast clinic on 11/30/2011 for what at that time looked like a ductal carcinoma in situ of the left breast. Due to the extent of disease she was recommended a mastectomy with axillary lymph node sampling.  #2 12/19/2011 patient had a left mastectomy with sentinel lymph node biopsy. Her final pathology revealed 2 foci of invasive ductal carcinoma measuring 0.8 and 0.6 cm. The tumor was low grade ER positive PR positive. Patient's tumor was found to be HER-2/neu positive with HER-2 ratio of 3.27. Ki-67 was 11%. Patient had sentinel lymph node  Biopsies performed one of 4 lymph nodes were positive for metastatic disease. Patient's final pathologic staging is T1 N1 M0. Stage II  CURRENT THERAPY:Patient is seen today for discussion of adjuvant chemotherapy with Herceptin.  INTERVAL HISTORY: Julie Ross 51 y.o. female returns for Followup visit post surgery. She still has a JP drain in. Her case was discussed at the multidisciplinary breast conference on Wednesday morning. The finding of a HER-2 positive invasive cancer was certainly a surprise to all of [place]. But also having a positive lymph node was surprising as well. Today patient feels well she certainly is shocked by the results of her final pathology. She was seen by Dr. Jamey Ripa yesterday who told her the results and gave her the pathology hard copy as well.  She denies any fevers chills night sweats headaches she has some pain at the mastectomy site. Otherwise remainder of the 10 point review of systems is negative.  MEDICAL HISTORY: Past Medical History  Diagnosis Date  . Hypertension   . Hypothyroidism   . Arthritis   . Neuromuscular disorder     MS TX WITH CYMBALTA   . Multiple sclerosis   . H/O colonoscopy   . H/O bone density study 03/2011  . Wears glasses   . Heart burn   . Depression   . Sleep apnea     MILD SLEEP APNEA, NO MACHINE  6 YRS AGO HPT [facility]   . Breast cancer     lt breast    ALLERGIES:   has no known allergies.  MEDICATIONS:  Current Outpatient Prescriptions  Medication Sig Dispense Refill  . baclofen (LIORESAL) 10 MG tablet Take 10 mg by mouth 3 (three) times daily.      Marland Kitchen CALCIUM-MAGNESIUM PO Take 1 tablet by mouth daily.      . clonazePAM (KLONOPIN) 0.5 MG tablet Take 0.5 mg by mouth at bedtime as needed. For sleep      . DULoxetine (CYMBALTA) 60 MG capsule Take 60 mg by mouth daily.      Marland Kitchen esomeprazole (NEXIUM) 40 MG capsule Take 40 mg by mouth daily before breakfast.      . gabapentin (NEURONTIN) 300 MG capsule Take 600 mg by mouth 3 (three) times daily.       Marland Kitchen HYDROcodone-acetaminophen (NORCO) 10-325 MG per tablet       . HYDROcodone-acetaminophen (NORCO) 5-325 MG per tablet Take  1-2 tablets by mouth every 4 (four) hours as needed.  30 tablet  0  . losartan-hydrochlorothiazide (HYZAAR) 50-12.5 MG per tablet Take 1 tablet by mouth daily.      . Multiple Vitamins-Minerals (MULTIVITAMINS THER. W/MINERALS) TABS Take 1 tablet by mouth daily.      . naproxen sodium (ANAPROX) 550 MG tablet Take 550 mg by mouth as needed.        SURGICAL HISTORY:  Past Surgical History  Procedure Date  . Tonsillectomy   . Tumor removal     FROM PELVIS AGE 55  . Cesarean section     X2   . No past surgeries     BLADDER SLING  4-5 YRS AGO   . Knee arthroscopy     LT KNEE 04/2011  . Total knee arthroplasty 08/15/2011      Procedure: TOTAL KNEE ARTHROPLASTY; lft Surgeon: Harvie Junior, MD;  Location: [facility] OR;  Service: Orthopedics;  Laterality: Left;  RIGHT KNEE CORTIZONE INJECTION  . Mohs surgery   . Knee arthroscopy 82    rt  . Joint replacement Feb 2013  . Mastectomy w/ sentinel node biopsy 12/19/2011    Procedure: MASTECTOMY WITH SENTINEL LYMPH NODE BIOPSY;  Surgeon: Currie Paris, MD;  Location: [facility] OR;  Service: General;  Laterality: Left;  left breast and left axilla     REVIEW OF SYSTEMS:  Pertinent items are noted in HPI.   PHYSICAL EXAMINATION: General appearance: alert, cooperative and appears stated age Neck: no adenopathy, no carotid bruit, no JVD, supple, symmetrical, trachea midline and thyroid not enlarged, symmetric, no tenderness/mass/nodules Lymph nodes: Cervical, supraclavicular, and axillary nodes normal. Resp: clear to auscultation bilaterally and normal percussion bilaterally Back: symmetric, no curvature. ROM normal. No CVA tenderness. Cardio: regular rate and rhythm, S1, S2 normal, no murmur, click, rub or gallop GI: soft, non-tender; bowel sounds normal; no masses,  no organomegaly Extremities: extremities normal, atraumatic, no cyanosis or edema Neurologic: Grossly normal  ECOG PERFORMANCE STATUS: 1 - Symptomatic but completely ambulatory  Blood pressure 144/75, pulse 106, temperature 98.5 F (36.9 C), temperature source Oral, height 5\' 6"  (1.676 m), weight 200 lb 3.2 oz (90.81 kg), last menstrual period 10/13/2011.  LABORATORY DATA: Lab Results  Component Value Date   WBC 7.5 12/12/2011   HGB 12.5 12/12/2011   HCT 38.8 12/12/2011   MCV 79.5 12/12/2011   PLT 252 12/12/2011      Chemistry      Component Value Date/Time   NA 136 12/12/2011 0924   K 3.6 12/12/2011 0924   CL 98 12/12/2011 0924   CO2 27 12/12/2011 0924   BUN 15 12/12/2011 0924   CREATININE 0.98 12/12/2011 0924      Component Value Date/Time   CALCIUM 9.4 12/12/2011 0924   ALKPHOS 116 11/30/2011 1240   AST  14 11/30/2011 1240   ALT 11 11/30/2011 1240   BILITOT 0.3 11/30/2011 1240     DOB: 26-Oct-1960 Age: 71 Gender: F Client Name [place] Collected Date: 12/19/2011 Received Date: 12/19/2011 Physician: Cyndia Bent Chart #: MRN # : [id number] Physician cc: Richardean Chimera, MD Sherian Rein, MD Race:W Visit #: [id number] Kaylyn Lim, RN REPORT OF SURGICAL PATHOLOGY ADDITIONAL INFORMATION: 5. PROGNOSTIC INDICATORS - ACIS Results IMMUNOHISTOCHEMICAL AND MORPHOMETRIC ANALYSIS BY THE AUTOMATED CELLULAR IMAGING SYSTEM (ACIS) Estrogen Receptor (Negative, <1%): 100%, STRONG STAINING INTENSITY Progesterone Receptor (Negative, <1%): 100%, STRONG STAINING INTENSITY Proliferation Marker Ki67 by M IB-1 (Low<20%): 11% All controls stained appropriately JOSHUA  KISH MD Pathologist, Electronic Signature ( Signed 12/29/2011) 5. CHROMOGENIC IN-SITU HYBRIDIZATION Interpretation: HER2/NEU BY CISH - SHOWS AMPLIFICATION BY CISH ANALYSIS. THE RATIO OF HER2: CEP 17 SIGNALS WAS 3.27 Reference range: Ratio: HER2:CEP17 < 1.8 gene amplification not observed Ratio: HER2:CEP 17 1.8-2.2 - equivocal result Ratio: HER2:CEP17 > 2.2 - gene amplification observed Pecola Leisure MD Pathologist, Electronic Signature ( Signed 12/27/2011) 1 of 4 FINAL for Carra, Caralyn M ([id number]) FINAL DIAGNOSIS Diagnosis 1. Lymph node, sentinel, biopsy, Left axillary - ONE BENIGN LYMPH NODE WITH NO TUMOR SEEN (0/1). 2. Lymph node, sentinel, biopsy, Left axilla - ONE LYMPH NODE POSITIVE FOR METASTATIC DUCTAL CARCINOMA (1/1). - SEE COMMENT. 3. Lymph node, sentinel, biopsy, Left axilla - ONE BENIGN LYMPH NODE WITH NO TUMOR SEEN (0/1). 4. Lymph node, sentinel, biopsy, Left axilla - ONE BENIGN LYMPH NODE WITH NO TUMOR SEEN (0/1). 5. Breast, simple mastectomy, Left - INVASIVE GRADE I DUCTAL CARCINOMA, TWO FOCI MEASURING 0.8 CM AND 0.6 CM ARISING IN A BACKGROUND OF EXTENSIVE INTERMEDIATE GRADE DUCTAL CARCINOMA IN  SITU WITH NECROSIS. - MARGINS ARE NEGATIVE. - SEE ONCOLOGY TEMPLATE. Microscopic Comment 2. On re-review of the frozen section slides, no metastatic carcinoma is identified. The carcinoma present is on permanent sections only. 5. BREAST, INVASIVE TUMOR, WITH LYMPH NODE SAMPLING Specimen, including laterality: Left breast with sentinel lymph node. Procedure: Left simple mastectomy with sentinel lymph node biopsy. Grade: I (Both foci of tumors show similar morphology). Tubule formation: 2. Nuclear pleomorphism: 2. Mitotic: 1. Tumor size (glass slide measurement): Two foci measuring 0.8 cm and 0.6 cm. Margins: Invasive, distance to closest margin: At least 1.5 cm. In-situ, distance to closest margin: At least 1.5 cm. Lymphovascular invasion: No definite lymph/vascular invasion is not identified however a sentinel lymph node is positive (see below). Ductal carcinoma in situ: Yes. Grade: Intermediate grade. Extensive intraductal component: Yes. Lobular neoplasia: Not identified. Tumor focality: Two foci. Treatment effect: Not applicable. Extent of tumor: Tumor confined to breast parenchyma. Lymph nodes: # examined: 4. Lymph nodes with metastasis: 1. Macrometastasis: (> 2.0 mm): 1. Extracapsular extension: Not identified. Breast prognostic profile: A breast prognostic profile will be performed (see below comment). Additional breast findings: Fibrocystic changes with usual ductal hyperplasia. TNM: mpT1b, pN1a, MX. Comment: The palpable nodular area seen grossly is comprised predominantly of intermediate grade ductal carcinoma in situ with only two foci of invasive ductal carcinoma. Both invasive foci are morphologically similar. As such a breast prognostic profile will only be performed on the larger tumor. A breast prognostic profile can be performed upon the smaller tumor per request. (RH:kh 12-21-11) 2 of 4 FINAL for Fristoe, Nicolet M ([id number]) Microscopic Comment(continued) Zandra Abts MD Pathologist, Electronic Signature (Case signed 12/21/2011) Intraoperative Diagnosis 1. RECEIVED FRESH IS TISSUE THAT CONTAINS A LYMPH NODE WHICH IS SAMPLED FOR RIOC. LEFT AXILLARY SENTINEL LYMPH NODE, FROZEN SECTION AND TOUCH PREPARATION- ONE LYMPH NODE WITH NO METASTATIC CARCINOMA IDENTIFIED. (RAH) 2. RECEIVED FRESH IS TISSUE THAT CONTAINS A LYMPH NODE WHICH IS SAMPLED FOR RIOC. LEFT AXILLARY SENTINEL LYMPH NODE, FROZEN SECTION AND TOUCH PREPARATION- ONE LYMPH NODE WITH NO METASTATIC CARCINOMA IDENTIFIED. (RAH) 3. RECEIVED FRESH IS TISSUE THAT CONTAINS A LYMPH NODE WHICH IS SAMPLED FOR RIOC. LEFT AXILLARY SENTINEL LYMPH NODE, FROZEN SECTION AND TOUCH PREPARATION- ONE LYMPH NODE WITH NO METASTATIC CARCINOMA IDENTIFIED. (RAH) 4. RECEIVED FRESH IS TISSUE THAT CONTAINS A LYMPH NODE WHICH IS SAMPLED FOR RIOC. LEFT AXILLARY SENTINEL LYMPH NODE, FROZEN SECTION AND TOUCH PREPARATION- ONE LYMPH NODE WITH NO METASTATIC CARCINOMA  IDENTIFIED. [facility]) Specimen Gross and Clinical Information Specimen(s) Obtained: 1. Lymph node, sentinel, biopsy, Left axillary 2. Lymph node, sentinel, biopsy, Left axilla 3. Lymph node, sentinel, biopsy, Left axilla 4. Lymph node, sentinel, biopsy, Left axilla 5. Breast, simple mastectomy, Left Specimen Clinical Information 1. Left breast cancer (tl) Gross 1. Rapid Intraoperative Consult performed (Yes or No): Yes. Specimen: Left axillary sentinel node. Number and size: One node, 2.2 cm Cut Surface(s): Soft, fatty, focally blue. Block Summary: The node is sectioned, touch preparations are made on one slide, and the tissue is submitted in one block labeled SLN for frozen section. 2. Rapid Intraoperative Consult performed (Yes or No): Yes. Specimen: Left axillary sentinel node. Number and size: One node, 2.1 cm. Cut Surface(s): Soft, fatty, focally blue. Block Summary: The node is bisected, touch preparations are made on one slide, and is submitted in  one block labeled SLN for frozen section. 3. Rapid Intraoperative Consult performed (Yes or No): Yes. Specimen: Left axillary sentinel node. Number and size: One node, 0.7 cm Cut Surface(s): Soft, fatty, focally blue. Block Summary: The node is bisected, touch preparations are made on one slide, and is submitted in one block labeled SLN for frozen section. 4. Rapid Intraoperative Consult performed (Yes or No): Yes. Specimen: Left axillary sentinel node. Number and size: One node, 1.5 cm. 3 of 4 FINAL for Pavlik, Cyndia M ([id number]) Gross(continued) Cut Surface(s): Soft, fatty, non blue. Block Summary: The node is bisected, touch preparations are made on one slide and is submitted in one block labeled SLN for frozen section. (SW:gt, 12/20/11) 5. Specimen: Left simple mastectomy, received fresh. The specimen was placed in formalin at 2:50 p.m. on 12/19/11. Specimen integrity (intact/disrupted): Intact. Weight: 1185 grams. Size: 28 x 24.5 x 7 cm. Skin: There is an attached 16 x 7.5 cm ellipse of skin with a central, unremarkable nipple. There is a suture attached at the superior edge. Tumor/cavity: In the mid central to mid lateral breast there is an ill defined area of palpable nodular fibrous tissue without a discrete mass identified. This area measures approximately 7 x 6 x 3 cm and extends to within 1.5 cm of the deep margin. Within this area there is a 1.5 cm cavitary defect, consistent with a previous biopsy site. There is a silver metallic biopsy clip present at the defect. Uninvolved parenchyma: The remainder of the breast tissue consists of fat and dense white fibrous tissue. Prognostic indicators: Obtain from paraffin blocks if needed. Lymph nodes: There are no lymph nodes identified. Block summary: 13 blocks submitted. A = deep margin at nodular area. B - I = sections of nodular area, sequentially labeled from lateral through medial, margins not included. J = lower medial. K =  upper medial. L = lower lateral. M = upper lateral. (GP:eps 12/20/11)  RADIOGRAPHIC STUDIES:  Nm Sentinel Node Inj-no Rpt (breast)  12/19/2011  CLINICAL DATA: breast cancer   Sulfur colloid was injected intradermally by the nuclear medicine  technologist for breast cancer sentinel node localization.      ASSESSMENT: 50 year old female with  #1 stage II invasive ductal carcinoma of the left breast with ductal carcinoma in situ. Patient is status post mastectomy that showed 2 foci of invasive cancer measuring 0.8 0.6 cm. The tumor is ER positive PR positive HER-2/neu amplified at 3.27. Patient is now seen in medical oncology for discussion of adjuvant treatment. As mentioned above her case was discussed at the multidisciplinary breast conference. Patient is noted to have one of 4  lymph nodes positive for metastatic disease. This was only axillary sentinel node biopsy. A discussion of whether patient under should undergo a full axillary lymph node dissection did take place but at the end of our extensive discussion it was recommended the patient will proceed with radiation and DOS she would feel the criteria of seed 11. Therefore is excellent lymph node dissection is not being done. Instead the patient will proceed with adjuvant chemotherapy and Herceptin. Her chemotherapy will consist of Taxotere carboplatinum given every 21 days. For the duration of her chemotherapy patient will get Herceptin on a weekly basis. Once she completes the chemotherapy she then will get Herceptin every 3 weeks. After completion of chemotherapy patient will proceed with radiation therapy with concurrent Herceptin. After completion of radiation she will start Herceptin and antiestrogen therapy consisting of either tamoxifen or an aromatase inhibitor.  #2 patient does have history of multiple sclerosis and fatigue related to that. Certainly she will get fatigued from her chemotherapy as well we will keep her neurologist very much  in the [place] to help manage her symptoms.   PLAN:   #1 patient will need a Port-A-Cath placed and we will ask Dr. Jamey Ripa to do this. She will also need chemotherapy teaching class.  #2 we discussed use of carboplatinum Taxotere and Herceptin adjuvantly risks and benefits of treatment were very clearly discussed with the patient all the literature was given to them at this visit. Antiemetics including Zofran Compazine were also prescribed. She also received Ativan as well. Patient also will receive dexamethasone as pretreatment for her Taxotere.  #3 we will start chemotherapy the first week of August hopefully by then her JP drain can be removed and she will have her Port-A-Cath in place.   All questions were answered. The patient knows to call the clinic with any problems, questions or concerns. We can certainly see the patient much sooner if necessary.  I spent 40 minutes counseling the patient face to face. The total time spent in the appointment was 30 minutes.    Drue Second, MD Medical/Oncology [facility] [phone] (beeper) [phone] (Office)  01/02/2012, 9:48 AM

## 2012-01-02 ENCOUNTER — Encounter: Payer: Self-pay | Admitting: Oncology

## 2012-01-03 ENCOUNTER — Other Ambulatory Visit (INDEPENDENT_AMBULATORY_CARE_PROVIDER_SITE_OTHER): Payer: Self-pay | Admitting: Surgery

## 2012-01-04 ENCOUNTER — Other Ambulatory Visit: Payer: Self-pay | Admitting: *Deleted

## 2012-01-04 ENCOUNTER — Encounter (INDEPENDENT_AMBULATORY_CARE_PROVIDER_SITE_OTHER): Payer: BC Managed Care – PPO | Admitting: Surgery

## 2012-01-04 DIAGNOSIS — C50919 Malignant neoplasm of unspecified site of unspecified female breast: Secondary | ICD-10-CM

## 2012-01-04 NOTE — Telephone Encounter (Signed)
Per staff message and POF I have schedudled appts. JMW

## 2012-01-04 NOTE — Telephone Encounter (Signed)
Crystal from schedulling here with request from pt. "Pt called requesting Rx for wig. Pt would like to [address] get." Will review with MD. Requested Crystal inform pt this Rx will be at [employer] for her to pick up tomorrow.

## 2012-01-05 ENCOUNTER — Telehealth: Payer: Self-pay | Admitting: Oncology

## 2012-01-05 ENCOUNTER — Other Ambulatory Visit: Payer: Self-pay | Admitting: *Deleted

## 2012-01-05 NOTE — Telephone Encounter (Signed)
S/w the pt and she is aware of her echo appt at [facility] and that appt with dr benshimon at [facility]

## 2012-01-05 NOTE — Telephone Encounter (Signed)
S/w the pt and she is aware to pick up her appt calendar when she comes in for the chemo educ class

## 2012-01-06 ENCOUNTER — Other Ambulatory Visit: Payer: Self-pay | Admitting: *Deleted

## 2012-01-06 NOTE — Telephone Encounter (Signed)
Error in chart. No medications needed at this time

## 2012-01-10 ENCOUNTER — Ambulatory Visit (HOSPITAL_COMMUNITY): Payer: BC Managed Care – PPO

## 2012-01-10 NOTE — Progress Notes (Signed)
Pt had mastectomy and 4 nodes 7/13-stayed 2 days due to MS-did well-sore under arm, To come in for bmet-

## 2012-01-11 ENCOUNTER — Ambulatory Visit (HOSPITAL_COMMUNITY): Payer: BC Managed Care – PPO | Attending: Internal Medicine | Admitting: Radiology

## 2012-01-11 ENCOUNTER — Ambulatory Visit (HOSPITAL_BASED_OUTPATIENT_CLINIC_OR_DEPARTMENT_OTHER)
Admission: RE | Admit: 2012-01-11 | Discharge: 2012-01-11 | Disposition: A | Discharge status: Home / Self Care | Payer: BC Managed Care – PPO | Source: Ambulatory Visit | Attending: Surgery | Admitting: Surgery

## 2012-01-11 DIAGNOSIS — C50419 Malignant neoplasm of upper-outer quadrant of unspecified female breast: Secondary | ICD-10-CM

## 2012-01-11 DIAGNOSIS — I079 Rheumatic tricuspid valve disease, unspecified: Secondary | ICD-10-CM | POA: Insufficient documentation

## 2012-01-11 DIAGNOSIS — C50919 Malignant neoplasm of unspecified site of unspecified female breast: Secondary | ICD-10-CM | POA: Insufficient documentation

## 2012-01-11 DIAGNOSIS — I080 Rheumatic disorders of both mitral and aortic valves: Secondary | ICD-10-CM | POA: Insufficient documentation

## 2012-01-11 LAB — BASIC METABOLIC PANEL
BUN: 11 mg/dL (ref 6–23)
CO2: 27 mEq/L (ref 19–32)
Calcium: 9.7 mg/dL (ref 8.4–10.5)
Chloride: 99 mEq/L (ref 96–112)
Creatinine, Ser: 0.89 mg/dL (ref 0.50–1.10)
GFR calc Af Amer: 84 mL/min — ABNORMAL LOW (ref >90)
GFR calc non Af Amer: 73 mL/min — ABNORMAL LOW (ref >90)
Glucose, Bld: 105 mg/dL — ABNORMAL HIGH (ref 70–99)
Potassium: 3.9 mEq/L (ref 3.5–5.1)
Sodium: 138 mEq/L (ref 135–145)

## 2012-01-11 NOTE — Progress Notes (Signed)
Echocardiogram performed.  

## 2012-01-13 ENCOUNTER — Ambulatory Visit (INDEPENDENT_AMBULATORY_CARE_PROVIDER_SITE_OTHER): Payer: BC Managed Care – PPO | Admitting: Surgery

## 2012-01-13 ENCOUNTER — Encounter (INDEPENDENT_AMBULATORY_CARE_PROVIDER_SITE_OTHER): Payer: Self-pay | Admitting: Surgery

## 2012-01-13 VITALS — BP 140/80 | HR 84 | Temp 97.4°F | Resp 16 | Ht 66.0 in | Wt 194.0 lb

## 2012-01-13 DIAGNOSIS — Z09 Encounter for follow-up examination after completed treatment for conditions other than malignant neoplasm: Secondary | ICD-10-CM

## 2012-01-13 NOTE — Progress Notes (Signed)
JEANNINE PENNISI    [id number] 01/13/2012    [birth year]   CC: Post op Left mastectomy with axillary sentinel lymph node excision  HPI: The patient returns for post op follow-up. She underwent a Left total mastectomy with 4 sentinel lymph nodes being removed on 12/19/2011. Over all she feels that she is doing well. Her drainage is down to 36 cc a day  PE: VITAL SIGNS: BP 140/80  Pulse 84  Temp 97.4 F (36.3 C) (Temporal)  Resp 16  Ht 5\' 6"  (1.676 m)  Wt 194 lb (87.998 kg)  BMI 31.31 kg/m2  The incision is healing nicely and there is no evidence of infection or hematoma.  A drain is left in site  DATA REVIEWED: Dose from the oncologist are reviewed. She plans chemotherapy to be followed by radiation therapy.  IMPRESSION: Patient doing well.  PLAN: I will see her Monday when she is scheduled to have a Port-A-Cath placed. I reviewed for placement with risks and complications and answered questions. I anticipate we can take her drain out on Monday. I have asked her to defer the start of her chemotherapy until we are sure there is no residual drainage.

## 2012-01-13 NOTE — Patient Instructions (Signed)
I will see you Monday for your surgery to place a Port-A-Cath. I anticipate we can take the remaining drain out at that time

## 2012-01-16 ENCOUNTER — Encounter (HOSPITAL_BASED_OUTPATIENT_CLINIC_OR_DEPARTMENT_OTHER)
Admission: RE | Disposition: A | Discharge status: Home / Self Care | Payer: Self-pay | Source: Ambulatory Visit | Attending: Surgery

## 2012-01-16 ENCOUNTER — Ambulatory Visit (HOSPITAL_BASED_OUTPATIENT_CLINIC_OR_DEPARTMENT_OTHER)
Admission: RE | Admit: 2012-01-16 | Discharge: 2012-01-16 | Disposition: A | Discharge status: Home / Self Care | Payer: BC Managed Care – PPO | Source: Ambulatory Visit | Attending: Surgery | Admitting: Surgery

## 2012-01-16 ENCOUNTER — Ambulatory Visit (HOSPITAL_COMMUNITY): Payer: BC Managed Care – PPO

## 2012-01-16 ENCOUNTER — Encounter (HOSPITAL_BASED_OUTPATIENT_CLINIC_OR_DEPARTMENT_OTHER): Payer: Self-pay | Admitting: Certified Registered"

## 2012-01-16 ENCOUNTER — Ambulatory Visit (HOSPITAL_BASED_OUTPATIENT_CLINIC_OR_DEPARTMENT_OTHER): Payer: BC Managed Care – PPO | Admitting: Certified Registered"

## 2012-01-16 ENCOUNTER — Encounter (HOSPITAL_BASED_OUTPATIENT_CLINIC_OR_DEPARTMENT_OTHER): Payer: Self-pay | Admitting: *Deleted

## 2012-01-16 DIAGNOSIS — C50919 Malignant neoplasm of unspecified site of unspecified female breast: Secondary | ICD-10-CM

## 2012-01-16 DIAGNOSIS — I10 Essential (primary) hypertension: Secondary | ICD-10-CM | POA: Insufficient documentation

## 2012-01-16 DIAGNOSIS — C50419 Malignant neoplasm of upper-outer quadrant of unspecified female breast: Secondary | ICD-10-CM

## 2012-01-16 DIAGNOSIS — Z01812 Encounter for preprocedural laboratory examination: Secondary | ICD-10-CM | POA: Insufficient documentation

## 2012-01-16 DIAGNOSIS — G4730 Sleep apnea, unspecified: Secondary | ICD-10-CM | POA: Insufficient documentation

## 2012-01-16 HISTORY — PX: PORTACATH PLACEMENT: SHX2246

## 2012-01-16 LAB — POCT HEMOGLOBIN-HEMACUE: Hemoglobin: 12.3 g/dL (ref 12.0–15.0)

## 2012-01-16 SURGERY — INSERTION, TUNNELED CENTRAL VENOUS DEVICE, WITH PORT
Anesthesia: General | Site: Chest | Laterality: Right | Wound class: Clean

## 2012-01-16 MED ORDER — EPHEDRINE SULFATE 50 MG/ML IJ SOLN
INTRAMUSCULAR | Status: DC | PRN
  Administered 2012-01-16: 5 mg via INTRAVENOUS
  Administered 2012-01-16: 10 mg via INTRAVENOUS

## 2012-01-16 MED ORDER — CEFAZOLIN SODIUM-DEXTROSE 2-3 GM-% IV SOLR
2.0000 g | INTRAVENOUS | Status: AC
  Administered 2012-01-16: 2 g via INTRAVENOUS

## 2012-01-16 MED ORDER — BUPIVACAINE HCL (PF) 0.25 % IJ SOLN
INTRAMUSCULAR | Status: DC | PRN
  Administered 2012-01-16: 10 mL

## 2012-01-16 MED ORDER — CHLORHEXIDINE GLUCONATE 4 % EX LIQD: 1.0000 "application " | Freq: Once | CUTANEOUS | Status: DC

## 2012-01-16 MED ORDER — DEXAMETHASONE SODIUM PHOSPHATE 4 MG/ML IJ SOLN
INTRAMUSCULAR | Status: DC | PRN
  Administered 2012-01-16: 8 mg via INTRAVENOUS

## 2012-01-16 MED ORDER — FENTANYL CITRATE 0.05 MG/ML IJ SOLN
INTRAMUSCULAR | Status: DC | PRN
  Administered 2012-01-16: 100 ug via INTRAVENOUS

## 2012-01-16 MED ORDER — HEPARIN (PORCINE) IN NACL 2-0.9 UNIT/ML-% IJ SOLN
INTRAMUSCULAR | Status: DC | PRN
  Administered 2012-01-16: 50 mL via INTRAVENOUS

## 2012-01-16 MED ORDER — HYDROMORPHONE HCL PF 1 MG/ML IJ SOLN
0.2500 mg | INTRAMUSCULAR | Status: DC | PRN
  Administered 2012-01-16 (×2): 0.5 mg via INTRAVENOUS

## 2012-01-16 MED ORDER — LACTATED RINGERS IV SOLN
INTRAVENOUS | Status: DC
  Administered 2012-01-16 (×2): via INTRAVENOUS

## 2012-01-16 MED ORDER — PROPOFOL 10 MG/ML IV EMUL
INTRAVENOUS | Status: DC | PRN
  Administered 2012-01-16: 200 mg via INTRAVENOUS

## 2012-01-16 MED ORDER — MIDAZOLAM HCL 5 MG/5ML IJ SOLN
INTRAMUSCULAR | Status: DC | PRN
  Administered 2012-01-16: 2 mg via INTRAVENOUS

## 2012-01-16 MED ORDER — HEPARIN SOD (PORK) LOCK FLUSH 100 UNIT/ML IV SOLN
INTRAVENOUS | Status: DC | PRN
  Administered 2012-01-16: 500 [IU] via INTRAVENOUS

## 2012-01-16 MED ORDER — ONDANSETRON HCL 4 MG/2ML IJ SOLN
INTRAMUSCULAR | Status: DC | PRN
  Administered 2012-01-16: 4 mg via INTRAVENOUS

## 2012-01-16 MED ORDER — LIDOCAINE HCL (CARDIAC) 20 MG/ML IV SOLN
INTRAVENOUS | Status: DC | PRN
  Administered 2012-01-16: 80 mg via INTRAVENOUS

## 2012-01-16 MED ORDER — ONDANSETRON HCL 4 MG/2ML IJ SOLN: 4.0000 mg | Freq: Once | INTRAMUSCULAR | Status: DC | PRN

## 2012-01-16 SURGICAL SUPPLY — 54 items
ADH SKN CLS APL DERMABOND .7 (GAUZE/BANDAGES/DRESSINGS) ×1
APL SKNCLS STERI-STRIP NONHPOA (GAUZE/BANDAGES/DRESSINGS)
BAG DECANTER FOR FLEXI CONT (MISCELLANEOUS) ×2 IMPLANT
BENZOIN TINCTURE PRP APPL 2/3 (GAUZE/BANDAGES/DRESSINGS) IMPLANT
BLADE HEX COATED 2.75 (ELECTRODE) ×2 IMPLANT
BLADE SURG 15 STRL LF DISP TIS (BLADE) ×1 IMPLANT
BLADE SURG 15 STRL SS (BLADE) ×2
CANISTER SUCTION 1200CC (MISCELLANEOUS) IMPLANT
CHLORAPREP W/TINT 26ML (MISCELLANEOUS) ×2 IMPLANT
CLOTH BEACON ORANGE TIMEOUT ST (SAFETY) ×2 IMPLANT
COVER MAYO STAND STRL (DRAPES) ×2 IMPLANT
COVER TABLE BACK 60X90 (DRAPES) ×2 IMPLANT
DECANTER SPIKE VIAL GLASS SM (MISCELLANEOUS) ×1 IMPLANT
DERMABOND ADVANCED (GAUZE/BANDAGES/DRESSINGS) ×1
DERMABOND ADVANCED .7 DNX12 (GAUZE/BANDAGES/DRESSINGS) IMPLANT
DRAPE C-ARM 42X72 X-RAY (DRAPES) ×2 IMPLANT
DRAPE LAPAROTOMY TRNSV 102X78 (DRAPE) ×2 IMPLANT
DRAPE UTILITY XL STRL (DRAPES) ×2 IMPLANT
ELECT REM PT RETURN 9FT ADLT (ELECTROSURGICAL) ×2
ELECTRODE REM PT RTRN 9FT ADLT (ELECTROSURGICAL) ×1 IMPLANT
GLOVE BIO SURGEON STRL SZ7.5 (GLOVE) ×1 IMPLANT
GLOVE BIOGEL PI IND STRL 7.5 (GLOVE) IMPLANT
GLOVE BIOGEL PI INDICATOR 7.5 (GLOVE) ×1
GLOVE EUDERMIC 7 POWDERFREE (GLOVE) ×2 IMPLANT
GLOVE INDICATOR 7.0 STRL GRN (GLOVE) ×1 IMPLANT
GOWN PREVENTION PLUS XLARGE (GOWN DISPOSABLE) ×4 IMPLANT
GOWN PREVENTION PLUS XXLARGE (GOWN DISPOSABLE) ×2 IMPLANT
IV CATH PLACEMENT UNIT 16 GA (IV SOLUTION) IMPLANT
IV KIT MINILOC 20X1 SAFETY (NEEDLE) IMPLANT
KIT BARDPORT ISP (Port) IMPLANT
KIT PORT POWER 8FR ISP CVUE (Catheter) ×1 IMPLANT
KIT PORT POWER ISP 8FR (Catheter) IMPLANT
KIT POWER CATH 8FR (Catheter) IMPLANT
KIT POWER PORT SLIM 6FR (PORTABLE EQUIPMENT SUPPLIES) IMPLANT
NDL HYPO 25X1 1.5 SAFETY (NEEDLE) ×1 IMPLANT
NEEDLE HYPO 25X1 1.5 SAFETY (NEEDLE) ×2 IMPLANT
PACK BASIN DAY SURGERY FS (CUSTOM PROCEDURE TRAY) ×2 IMPLANT
PENCIL BUTTON HOLSTER BLD 10FT (ELECTRODE) ×2 IMPLANT
SET SHEATH INTRODUCER 10FR (MISCELLANEOUS) IMPLANT
SHEATH COOK PEEL AWAY SET 9F (SHEATH) IMPLANT
SHEET MEDIUM DRAPE 40X70 STRL (DRAPES) IMPLANT
SLEEVE SCD COMPRESS KNEE MED (MISCELLANEOUS) ×2 IMPLANT
STAPLER VISISTAT (STAPLE) IMPLANT
STRIP CLOSURE SKIN 1/2X4 (GAUZE/BANDAGES/DRESSINGS) IMPLANT
SUT MNCRL AB 4-0 PS2 18 (SUTURE) ×2 IMPLANT
SUT PROLENE 2 0 SH DA (SUTURE) ×2 IMPLANT
SUT VICRYL 3-0 CR8 SH (SUTURE) ×2 IMPLANT
SYR 5ML LUER SLIP (SYRINGE) ×2 IMPLANT
SYR CONTROL 10ML LL (SYRINGE) ×2 IMPLANT
TOWEL OR 17X24 6PK STRL BLUE (TOWEL DISPOSABLE) ×4 IMPLANT
TOWEL OR NON WOVEN STRL DISP B (DISPOSABLE) ×2 IMPLANT
TUBE CONNECTING 20X1/4 (TUBING) IMPLANT
WATER STERILE IRR 1000ML POUR (IV SOLUTION) ×2 IMPLANT
YANKAUER SUCT BULB TIP NO VENT (SUCTIONS) IMPLANT

## 2012-01-16 NOTE — Transfer of Care (Signed)
Immediate Anesthesia Transfer of Care Note  Patient: Julie Ross  Procedure(s) Performed: Procedure(s) (LRB): INSERTION PORT-A-CATH (Right)  Patient Location: PACU  Anesthesia Type: General  Level of Consciousness: awake, alert , oriented and patient cooperative  Airway & Oxygen Therapy: Patient Spontanous Breathing and Patient connected to face mask oxygen  Post-op Assessment: Report given to PACU RN and Post -op Vital signs reviewed and stable  Post vital signs: Reviewed and stable  Complications: No apparent anesthesia complications

## 2012-01-16 NOTE — H&P (View-Only) (Signed)
Julie Ross    [id number] 01/13/2012    11/07/1960   CC: Post op Left mastectomy with axillary sentinel lymph node excision  HPI: The patient returns for post op follow-up. She underwent a Left total mastectomy with 4 sentinel lymph nodes being removed on 12/19/2011. Over all she feels that she is doing well. Her drainage is down to 36 cc a day  PE: VITAL SIGNS: BP 140/80  Pulse 84  Temp 97.4 F (36.3 C) (Temporal)  Resp 16  Ht 5' 6" (1.676 m)  Wt 194 lb (87.998 kg)  BMI 31.31 kg/m2  The incision is healing nicely and there is no evidence of infection or hematoma.  A drain is left in site  DATA REVIEWED: Dose from the oncologist are reviewed. She plans chemotherapy to be followed by radiation therapy.  IMPRESSION: Patient doing well.  PLAN: I will see her Monday when she is scheduled to have a Port-A-Cath placed. I reviewed for placement with risks and complications and answered questions. I anticipate we can take her drain out on Monday. I have asked her to defer the start of her chemotherapy until we are sure there is no residual drainage. 

## 2012-01-16 NOTE — Anesthesia Postprocedure Evaluation (Signed)
  Anesthesia Post-op Note  Patient: Julie Ross  Procedure(s) Performed: Procedure(s) (LRB): INSERTION PORT-A-CATH (Right)  Patient Location: PACU  Anesthesia Type: General  Level of Consciousness: awake, alert  and oriented  Airway and Oxygen Therapy: Patient Spontanous Breathing  Post-op Pain: mild  Post-op Assessment: Post-op Vital signs reviewed  Post-op Vital Signs: Reviewed  Complications: No apparent anesthesia complications

## 2012-01-16 NOTE — Anesthesia Procedure Notes (Signed)
Procedure Name: LMA Insertion Date/Time: 01/16/2012 9:36 AM Performed by: Verlan Friends Pre-anesthesia Checklist: Patient identified, Emergency Drugs available, Suction available, Patient being monitored and Timeout performed Patient Re-evaluated:Patient Re-evaluated prior to inductionOxygen Delivery Method: Circle System Utilized Preoxygenation: Pre-oxygenation with 100% oxygen Intubation Type: IV induction Ventilation: Mask ventilation without difficulty LMA: LMA inserted LMA Size: 4.0 Number of attempts: 1 Airway Equipment and Method: bite block Placement Confirmation: positive ETCO2 Tube secured with: Tape Dental Injury: Teeth and Oropharynx as per pre-operative assessment

## 2012-01-16 NOTE — Anesthesia Preprocedure Evaluation (Addendum)
Anesthesia Evaluation  Patient identified by MRN, date of birth, ID band Patient awake    Reviewed: Allergy & Precautions, H&P , NPO status , Patient's Chart, lab work & pertinent test results  Airway Mallampati: II TM Distance: >3 FB Neck ROM: Full    Dental  (+) Teeth Intact and Dental Advisory Given   Pulmonary sleep apnea (very mild, not needing CPAP) ,  breath sounds clear to auscultation        Cardiovascular hypertension, Pt. on medications Rhythm:Regular Rate:Normal     Neuro/Psych  Neuromuscular disease ( MS)    GI/Hepatic   Endo/Other    Renal/GU      Musculoskeletal   Abdominal   Peds  Hematology   Anesthesia Other Findings   Reproductive/Obstetrics                         Anesthesia Physical Anesthesia Plan  ASA: II  Anesthesia Plan: General   Post-op Pain Management:    Induction: Intravenous  Airway Management Planned: LMA  Additional Equipment:   Intra-op Plan:   Post-operative Plan: Extubation in OR  Informed Consent: I have reviewed the patients History and Physical, chart, labs and discussed the procedure including the risks, benefits and alternatives for the proposed anesthesia with the patient or authorized representative who has indicated his/her understanding and acceptance.   Dental advisory given  Plan Discussed with: CRNA, Anesthesiologist and Surgeon  Anesthesia Plan Comments:         Anesthesia Quick Evaluation

## 2012-01-16 NOTE — Op Note (Signed)
Jensine M Buffkin 06/03/1961 [id number] 01/04/2012  Preoperative diagnosis: PAC needed  Postoperative diagnosis: Same  Procedure: Portacath Placement  Surgeon: Currie Paris, MD, FACS  Assistant: Charissa Bash, MS III  Anesthesia: General  Clinical History and Indications: The patient is getting ready to begin chemotherapy for her cancer. She  needs a Port-A-Cath for venous access.  Description of Procedure: I have seen the patient in the holding area and confirmed the plans for the procedure as noted above. I reviewed the risks and complications again and the patient has no further questions.  The patient was then taken to the operating room. After satisfactory general anesthesia had been obtained the left sided drain was removed as we had planned and the upper chest and lower neck were prepped and draped as a sterile field. The timeout was done before draping.  The right subclavian vein was entered and the guidewire threaded into the superior vena cava right atrial area under fluoroscopic guidance. An incision was then made on the anterior chest wall and a subcutaneous pocket fashioned for the port reservoir.  The port tubing was then brought through a subcutaneous tunnel from the port site to the guidewire site. The dilator and peel-away sheath were then advanced over the guidewire while monitoring this with fluoroscopy. The guidewire and dilator were removed and the tubing threaded to approximately 22 cm. The peel-away sheath was then removed. The catheter aspirated and flushed easily. Using fluoroscopy the tip was backed out into the superior vena cava right atrial junction area. It aspirated and flushed easily. The reservoir was attached and the locking mechanism engaged. That aspirated and flushed easily.  The reservoir was secured to the fascia with 2 sutures of 2-0 Prolene. A final check with fluoroscopy was done to make sure we had no kinks and good positioning of the tip of the  catheter. Everything appeared to be okay. The catheter was aspirated, flushed with dilute heparin and then concentrated aqueous heparin.  The incision was then closed with interrupted 3-0 Vicryl, and 4-0 Monocryl subcuticular with Dermabond on the skin.  There were no operative complications. Estimated blood loss was minimal. All counts were correct. The patient tolerated the procedure well.  Currie Paris, MD, FACS 01/16/2012 10:25 AM

## 2012-01-16 NOTE — Interval H&P Note (Signed)
History and Physical Interval Note:  01/16/2012 9:13 AM  Julie Ross  has presented today for surgery, with the diagnosis of Breast Cancer  The various methods of treatment have been discussed with the patient and family. After consideration of risks, benefits and other options for treatment, the patient has consented to  Procedure(s) (LRB): INSERTION PORT-A-CATH (N/A) as a surgical intervention .  The patient's history has been reviewed, patient examined, no change in status, stable for surgery.  I have reviewed the patient's chart and labs.  Questions were answered to the patient's satisfaction.   Drain 30 cc yesterday,  Will plan to remove today as previously discussed  Aqeel Norgaard J

## 2012-01-16 NOTE — Addendum Note (Signed)
Addendum  created 01/16/12 1106 by Jostin Rue A Madisson Kulaga, MD   Modules edited:Anesthesia Attestations    

## 2012-01-16 NOTE — Addendum Note (Signed)
Addendum  created 01/16/12 1106 by Kerby Nora, MD   Modules edited:Anesthesia Attestations

## 2012-01-17 ENCOUNTER — Encounter (HOSPITAL_BASED_OUTPATIENT_CLINIC_OR_DEPARTMENT_OTHER): Payer: Self-pay | Admitting: Surgery

## 2012-01-18 ENCOUNTER — Encounter: Payer: Self-pay | Admitting: *Deleted

## 2012-01-18 ENCOUNTER — Other Ambulatory Visit: Payer: BC Managed Care – PPO

## 2012-01-18 ENCOUNTER — Other Ambulatory Visit: Payer: Self-pay | Admitting: *Deleted

## 2012-01-18 DIAGNOSIS — C50919 Malignant neoplasm of unspecified site of unspecified female breast: Secondary | ICD-10-CM

## 2012-01-18 MED ORDER — LIDOCAINE-PRILOCAINE 2.5-2.5 % EX CREA: TOPICAL_CREAM | CUTANEOUS | Status: DC | PRN

## 2012-01-18 MED ORDER — UNABLE TO FIND: Status: DC

## 2012-01-19 ENCOUNTER — Other Ambulatory Visit: Payer: Self-pay | Admitting: Medical Oncology

## 2012-01-20 ENCOUNTER — Ambulatory Visit (INDEPENDENT_AMBULATORY_CARE_PROVIDER_SITE_OTHER): Payer: BC Managed Care – PPO | Admitting: Surgery

## 2012-01-20 ENCOUNTER — Ambulatory Visit: Payer: BC Managed Care – PPO

## 2012-01-20 ENCOUNTER — Other Ambulatory Visit: Payer: BC Managed Care – PPO | Admitting: Lab

## 2012-01-20 ENCOUNTER — Ambulatory Visit: Payer: BC Managed Care – PPO | Admitting: Oncology

## 2012-01-20 ENCOUNTER — Encounter (INDEPENDENT_AMBULATORY_CARE_PROVIDER_SITE_OTHER): Payer: Self-pay | Admitting: Surgery

## 2012-01-20 VITALS — BP 148/84 | HR 84 | Temp 97.9°F | Resp 16 | Ht 66.0 in | Wt 201.0 lb

## 2012-01-20 DIAGNOSIS — IMO0002 Reserved for concepts with insufficient information to code with codable children: Secondary | ICD-10-CM

## 2012-01-20 NOTE — Patient Instructions (Addendum)
Se me next week

## 2012-01-20 NOTE — Progress Notes (Signed)
Chief complaint: Postop Port-A-Cath and postop mastectomy drain removal  History of present illness: The patient underwent a mastectomy several weeks ago and it took her drain out Monday at the time of Port-A-Cath placement. She comes in for followup today. She thinks there is fluid.  Exam: The port site is healing nicely. There is a seroma present on the left was aspirated and Removed 55 cc.  Impression: Postop seroma; no problem post Port-A-Cath placement  Plan: I'll recheck her next week for the seroma.

## 2012-01-21 ENCOUNTER — Ambulatory Visit: Payer: BC Managed Care – PPO

## 2012-01-23 ENCOUNTER — Encounter (HOSPITAL_COMMUNITY): Payer: Self-pay

## 2012-01-23 ENCOUNTER — Other Ambulatory Visit: Payer: Self-pay | Admitting: Certified Registered Nurse Anesthetist

## 2012-01-23 ENCOUNTER — Ambulatory Visit (HOSPITAL_COMMUNITY)
Admission: RE | Admit: 2012-01-23 | Discharge: 2012-01-23 | Disposition: A | Discharge status: Home / Self Care | Payer: BC Managed Care – PPO | Source: Ambulatory Visit | Attending: Internal Medicine | Admitting: Internal Medicine

## 2012-01-23 VITALS — BP 130/78 | HR 90 | Ht 66.0 in | Wt 200.0 lb

## 2012-01-23 DIAGNOSIS — C50419 Malignant neoplasm of upper-outer quadrant of unspecified female breast: Secondary | ICD-10-CM

## 2012-01-23 DIAGNOSIS — G35 Multiple sclerosis: Secondary | ICD-10-CM | POA: Insufficient documentation

## 2012-01-23 NOTE — Patient Instructions (Addendum)
Follow up in 2 months with an ECHO  

## 2012-01-23 NOTE — Assessment & Plan Note (Addendum)
Explained the purpose of HF clinic as it relates to breast cancer treatment. Explained that there is a 1 in 10 chance of cardiotoxicity associated with Herceptin. Reviewed ECHO results. Follow up in 2 months with an ECHO.   Patient seen and examined with Tonye Becket, NP. We discussed all aspects of the encounter. I agree with the assessment and plan as stated above.  Echo images reviewed personally. All parameters look good. Reviewed purpose of cardio-oncology clinic as well as signs and symptoms of HF to look for. Follow-up with echo every 3 months while on Herceptin.

## 2012-01-23 NOTE — Progress Notes (Signed)
Patient ID: Julie Ross, female   DOB: 12/20/1960, 51 y.o.   MRN: [id number] HPI:  Shavanna is a 51 year old female with a recent diagnosis of ductal carcinoma in situ of the left breast, HTN, osteoarthritis, and MS (1998)  S/P L mastectomy and porta cath  12/19/11.  Plan to start carboplatnum toxotere/Herceptin to start 01/27/12 for for 12 weeks. HER-2/neu positive. 1 of  4 lymph nodes + for metastatic disease.  Pathology T1 N1 M0. Stage II .  7/24/1 3ECHO EF 60%   She is referred by Dr Welton Flakes to heart failure due to breast cancer. She presents today with her daughter and husband.  Denies h/o known heart disease. Never had cath or stress test. Denies SOB/PND/Orthopnea/edema. . Unable to exercise due to osteoarthritis. She does not smoke or drink alcohol.   Review of Systems:     Cardiac Review of Systems: {Y] = yes [ ]  = no  Chest Pain [    ]  Resting SOB [   ] Exertional SOB  [  ]  Orthopnea [  ]   Pedal Edema [   ]    Palpitations [  ] Syncope  [  ]   Presyncope [   ]  General Review of Systems: [Y] = yes [  ]=no Constitional: recent weight change [  ]; anorexia [  ]; fatigue [Y  ]; nausea [  ]; night sweats [  ]; fever [  ]; or chills [  ];                                                                                                                                        Dental: poor dentition[  ]; Last Dentist visit:  Eye : blurred vision [  ]; diplopia [   ]; vision changes [  ];  Amaurosis fugax[  ]; Resp: cough [  ];  wheezing[  ];  hemoptysis[  ]; shortness of breath[  ]; paroxysmal nocturnal dyspnea[  ]; dyspnea on exertion[  ]; or orthopnea[  ];  GI:  gallstones[  ], vomiting[  ];  dysphagia[  ]; melena[  ];  hematochezia [  ]; heartburn[  ];   Hx of  Colonoscopy[  ]; GU: kidney stones [  ]; hematuria[  ];   dysuria [  ];  nocturia[  ];  history of     obstruction [  ];                 Skin: rash, swelling[  ];, hair loss[  ];  peripheral edema[  ];  or itching[  ]; Musculosketetal:  myalgias[ Y ];  joint swelling[  ];  joint erythema[  ];  joint pain[Y  ];  back pain[  ];  Heme/Lymph: bruising[  ];  bleeding[  ];  anemia[  ];  Neuro: TIA[  ];  headaches[  ];  stroke[  ];  vertigo[  ];  seizures[  ];   paresthesias[  ];  difficulty walking[  ];  Psych:depression[Y  ]; anxiety[  ];  Endocrine: diabetes[  ];  thyroid dysfunction[  ];  Immunizations: Flu [  ]; Pneumococcal[  ];  Other:    Past Medical History  Diagnosis Date  . Hypertension   . Hypothyroidism   . Arthritis   . Neuromuscular disorder     MS TX WITH CYMBALTA   . Multiple sclerosis   . H/O colonoscopy   . H/O bone density study 03/2011  . Wears glasses   . Heart burn   . Depression   . Sleep apnea     MILD SLEEP APNEA, NO MACHINE  6 YRS AGO [facility]   . Breast cancer     lt breast    Current Outpatient Prescriptions  Medication Sig Dispense Refill  . baclofen (LIORESAL) 10 MG tablet Take 10 mg by mouth 3 (three) times daily.      Marland Kitchen CALCIUM-MAGNESIUM PO Take 1 tablet by mouth daily.      . clonazePAM (KLONOPIN) 0.5 MG tablet Take 0.5 mg by mouth at bedtime as needed. For sleep      . dexamethasone (DECADRON) 4 MG tablet Take 2 tablets two times a day the day before Taxotere. Then take 2 tabs two times a day starting the day after chemo for 3 days.  30 tablet  1  . DULoxetine (CYMBALTA) 60 MG capsule Take 60 mg by mouth daily.      Marland Kitchen esomeprazole (NEXIUM) 40 MG capsule Take 40 mg by mouth daily before breakfast.      . gabapentin (NEURONTIN) 300 MG capsule Take 600 mg by mouth 3 (three) times daily.       Marland Kitchen HYDROcodone-acetaminophen (NORCO) 10-325 MG per tablet       . lidocaine-prilocaine (EMLA) cream Apply topically as needed.  30 g  0  . LORazepam (ATIVAN) 0.5 MG tablet Take 1 tablet (0.5 mg total) by mouth every 6 (six) hours as needed (Nausea or vomiting).  30 tablet  0  . losartan-hydrochlorothiazide (HYZAAR) 50-12.5 MG per tablet Take 1 tablet by mouth daily.      . Multiple  Vitamins-Minerals (MULTIVITAMINS THER. W/MINERALS) TABS Take 1 tablet by mouth daily.      . naproxen sodium (ANAPROX) 550 MG tablet Take 550 mg by mouth as needed.      . ondansetron (ZOFRAN) 8 MG tablet Take 1 tablet two times a day starting the day after chemo for 3 days. Then take 1 tab two times a day as needed for nausea or vomiting.  30 tablet  1  . prochlorperazine (COMPAZINE) 10 MG tablet Take 1 tablet (10 mg total) by mouth every 6 (six) hours as needed (Nausea or vomiting).  30 tablet  1  . prochlorperazine (COMPAZINE) 25 MG suppository Place 1 suppository (25 mg total) rectally every 12 (twelve) hours as needed for nausea.  12 suppository  3  . UNABLE TO FIND Cranial Prothesis  1 each  0     No Known Allergies  History   Social History  . Marital Status: Married    Spouse Name: N/A    Number of Children: N/A  . Years of Education: N/A   Occupational History  . Not on file.   Social History Main Topics  . Smoking status: Never Smoker   . Smokeless tobacco: Not on file  . Alcohol Use: No  . Drug Use: No  .  Sexually Active: Yes   Other Topics Concern  . Not on file   Social History Narrative  . No narrative on file    Family History  Problem Relation Age of Onset  . Breast cancer Paternal Aunt   . Breast cancer Maternal Grandmother     PHYSICAL EXAM: Filed Vitals:   01/23/12 1456  BP: 130/78  Pulse: 90   General:  Well appearing. No respiratory difficulty (daughter and husband present) HEENT: normal Neck: supple. no JVD. Carotids 2+ bilat; no bruits. No lymphadenopathy or thryomegaly appreciated. Cor: PMI nondisplaced. Regular rate & rhythm. No rubs, gallops or murmurs. Lungs: clear Abdomen: soft, nontender, nondistended. No hepatosplenomegaly. No bruits or masses. Good bowel sounds. Extremities: no cyanosis, clubbing, rash, edema Neuro: alert & oriented x 3, cranial nerves grossly intact. moves all 4 extremities w/o difficulty. Affect  pleasant.  ASSESSMENT & PLAN:

## 2012-01-24 ENCOUNTER — Ambulatory Visit (INDEPENDENT_AMBULATORY_CARE_PROVIDER_SITE_OTHER): Payer: BC Managed Care – PPO | Admitting: Surgery

## 2012-01-24 ENCOUNTER — Encounter (INDEPENDENT_AMBULATORY_CARE_PROVIDER_SITE_OTHER): Payer: Self-pay | Admitting: Surgery

## 2012-01-24 VITALS — BP 130/64 | HR 64 | Temp 98.4°F | Resp 16 | Ht 66.0 in | Wt 201.0 lb

## 2012-01-24 DIAGNOSIS — Z09 Encounter for follow-up examination after completed treatment for conditions other than malignant neoplasm: Secondary | ICD-10-CM

## 2012-01-24 NOTE — Progress Notes (Signed)
Chief complaint: Postop Port-A-Cath and postop mastectomy drain removal  History of present illness: The patient underwent a mastectomy several weeks ago and it took her drain out Monday at the time of Port-A-Cath placement. She comes in for a second followup today. She thinks there is fluid.  Exam: The port site is healing nicely. There is a seroma present on the left was aspirated and Removed 55 cc.  Impression: Postop seroma;   Plan: I'll recheck her in a few days for the seroma

## 2012-01-24 NOTE — Patient Instructions (Signed)
Call [facility] Thursday morning to let her know about the fluid. See me in two weeks

## 2012-01-25 ENCOUNTER — Telehealth: Payer: Self-pay | Admitting: *Deleted

## 2012-01-25 NOTE — Telephone Encounter (Signed)
Pt called states "  have an appt on 8/9 and I missed my 1st appt on 8/2 because Dr. Jamey Ripa said I had to wait a week, so when I come on Friday is my chemo appt going to be 2 hours or 5 hours because it would be my 1st treatment." Discussed with pt her 1st chemo appt will be about 5hours. Her schedule will be updated after she has seen MD and any changes are made. At this time her appt for 8/9 -lab at 0900, MD at 0930 and infusion at 1045. MD will be made aware her 1st chemo appt 8/2 for 5 hours was missed and the next appt 8/9- if pt receives chemo app time will need to be changed to a 5 hr appt.  Pt verbalized understanding. Denied further assistance

## 2012-01-26 ENCOUNTER — Telehealth (INDEPENDENT_AMBULATORY_CARE_PROVIDER_SITE_OTHER): Payer: Self-pay | Admitting: General Surgery

## 2012-01-26 NOTE — Telephone Encounter (Signed)
Appt made for 8:30 am patient to be here at 8:15 am so we can see her prior to her chemo tomorrow. She is to be at [facility] at 9:00 am.

## 2012-01-26 NOTE — Telephone Encounter (Signed)
Pt calling in (as requested) to report update on fluid collection at Lt breast.  She states Dr. Jamey Ripa aspirated fluid and she was to call today with update.  States there is a small amount that has returned, but not as much as was there before.

## 2012-01-27 ENCOUNTER — Ambulatory Visit (HOSPITAL_BASED_OUTPATIENT_CLINIC_OR_DEPARTMENT_OTHER): Payer: BC Managed Care – PPO | Admitting: Oncology

## 2012-01-27 ENCOUNTER — Telehealth: Payer: Self-pay | Admitting: Oncology

## 2012-01-27 ENCOUNTER — Other Ambulatory Visit (HOSPITAL_BASED_OUTPATIENT_CLINIC_OR_DEPARTMENT_OTHER): Payer: BC Managed Care – PPO | Admitting: Lab

## 2012-01-27 ENCOUNTER — Ambulatory Visit (HOSPITAL_BASED_OUTPATIENT_CLINIC_OR_DEPARTMENT_OTHER): Payer: BC Managed Care – PPO

## 2012-01-27 ENCOUNTER — Encounter: Payer: Self-pay | Admitting: Oncology

## 2012-01-27 ENCOUNTER — Ambulatory Visit (INDEPENDENT_AMBULATORY_CARE_PROVIDER_SITE_OTHER): Payer: BC Managed Care – PPO | Admitting: Surgery

## 2012-01-27 ENCOUNTER — Encounter (INDEPENDENT_AMBULATORY_CARE_PROVIDER_SITE_OTHER): Payer: Self-pay | Admitting: Surgery

## 2012-01-27 VITALS — BP 142/68 | Temp 97.5°F | Ht 66.0 in | Wt 199.0 lb

## 2012-01-27 VITALS — BP 133/75 | HR 71 | Temp 98.4°F | Resp 20 | Ht 66.0 in | Wt 199.7 lb

## 2012-01-27 VITALS — BP 138/82 | HR 70 | Temp 97.0°F | Resp 18

## 2012-01-27 DIAGNOSIS — Z5111 Encounter for antineoplastic chemotherapy: Secondary | ICD-10-CM

## 2012-01-27 DIAGNOSIS — C50419 Malignant neoplasm of upper-outer quadrant of unspecified female breast: Secondary | ICD-10-CM

## 2012-01-27 DIAGNOSIS — Z09 Encounter for follow-up examination after completed treatment for conditions other than malignant neoplasm: Secondary | ICD-10-CM

## 2012-01-27 DIAGNOSIS — Z5112 Encounter for antineoplastic immunotherapy: Secondary | ICD-10-CM

## 2012-01-27 DIAGNOSIS — C773 Secondary and unspecified malignant neoplasm of axilla and upper limb lymph nodes: Secondary | ICD-10-CM

## 2012-01-27 DIAGNOSIS — F411 Generalized anxiety disorder: Secondary | ICD-10-CM

## 2012-01-27 DIAGNOSIS — G35 Multiple sclerosis: Secondary | ICD-10-CM

## 2012-01-27 DIAGNOSIS — Z170 Estrogen receptor positive status [ER+]: Secondary | ICD-10-CM

## 2012-01-27 LAB — CBC WITH DIFFERENTIAL/PLATELET
BASO%: 0.9 % (ref 0.0–2.0)
Basophils Absolute: 0.1 10*3/uL (ref 0.0–0.1)
EOS%: 2.6 % (ref 0.0–7.0)
Eosinophils Absolute: 0.2 10*3/uL (ref 0.0–0.5)
HCT: 33.6 % — ABNORMAL LOW (ref 34.8–46.6)
HGB: 11.2 g/dL — ABNORMAL LOW (ref 11.6–15.9)
LYMPH%: 34.5 % (ref 14.0–49.7)
MCH: 25.9 pg (ref 25.1–34.0)
MCHC: 33.3 g/dL (ref 31.5–36.0)
MCV: 77.8 fL — ABNORMAL LOW (ref 79.5–101.0)
MONO#: 0.5 10*3/uL (ref 0.1–0.9)
MONO%: 7 % (ref 0.0–14.0)
NEUT#: 3.6 10*3/uL (ref 1.5–6.5)
NEUT%: 55 % (ref 38.4–76.8)
Platelets: 231 10*3/uL (ref 145–400)
RBC: 4.32 10*6/uL (ref 3.70–5.45)
RDW: 13.9 % (ref 11.2–14.5)
WBC: 6.5 10*3/uL (ref 3.9–10.3)
lymph#: 2.3 10*3/uL (ref 0.9–3.3)

## 2012-01-27 LAB — COMPREHENSIVE METABOLIC PANEL
ALT: 16 U/L (ref 0–35)
AST: 17 U/L (ref 0–37)
Albumin: 3.8 g/dL (ref 3.5–5.2)
Alkaline Phosphatase: 139 U/L — ABNORMAL HIGH (ref 39–117)
BUN: 14 mg/dL (ref 6–23)
CO2: 28 mEq/L (ref 19–32)
Calcium: 9 mg/dL (ref 8.4–10.5)
Chloride: 102 mEq/L (ref 96–112)
Creatinine, Ser: 1 mg/dL (ref 0.50–1.10)
Glucose, Bld: 88 mg/dL (ref 70–99)
Potassium: 3.8 mEq/L (ref 3.5–5.3)
Sodium: 139 mEq/L (ref 135–145)
Total Bilirubin: 0.4 mg/dL (ref 0.3–1.2)
Total Protein: 6.5 g/dL (ref 6.0–8.3)

## 2012-01-27 MED ORDER — DIPHENHYDRAMINE HCL 25 MG PO CAPS
50.0000 mg | ORAL_CAPSULE | Freq: Once | ORAL | Status: AC
  Administered 2012-01-27: 50 mg via ORAL

## 2012-01-27 MED ORDER — TRASTUZUMAB CHEMO INJECTION 440 MG
4.0000 mg/kg | Freq: Once | INTRAVENOUS | Status: AC
  Administered 2012-01-27: 357 mg via INTRAVENOUS
  Filled 2012-01-27: qty 17

## 2012-01-27 MED ORDER — ONDANSETRON 16 MG/50ML IVPB (CHCC)
16.0000 mg | Freq: Once | INTRAVENOUS | Status: AC
  Administered 2012-01-27: 16 mg via INTRAVENOUS

## 2012-01-27 MED ORDER — ACETAMINOPHEN 325 MG PO TABS
650.0000 mg | ORAL_TABLET | Freq: Once | ORAL | Status: AC
  Administered 2012-01-27: 650 mg via ORAL

## 2012-01-27 MED ORDER — SODIUM CHLORIDE 0.9 % IV SOLN
778.8000 mg | Freq: Once | INTRAVENOUS | Status: AC
  Administered 2012-01-27: 780 mg via INTRAVENOUS
  Filled 2012-01-27: qty 78

## 2012-01-27 MED ORDER — LORAZEPAM 2 MG/ML IJ SOLN
0.5000 mg | Freq: Once | INTRAMUSCULAR | Status: AC
  Administered 2012-01-27: 0.5 mg via INTRAVENOUS

## 2012-01-27 MED ORDER — SODIUM CHLORIDE 0.9 % IV SOLN
Freq: Once | INTRAVENOUS | Status: AC
  Administered 2012-01-27: 11:00:00 via INTRAVENOUS

## 2012-01-27 MED ORDER — HEPARIN SOD (PORK) LOCK FLUSH 100 UNIT/ML IV SOLN
500.0000 [IU] | Freq: Once | INTRAVENOUS | Status: AC | PRN
  Administered 2012-01-27: 500 [IU]
  Filled 2012-01-27: qty 5

## 2012-01-27 MED ORDER — SODIUM CHLORIDE 0.9 % IJ SOLN
10.0000 mL | INTRAMUSCULAR | Status: DC | PRN
  Administered 2012-01-27: 10 mL
  Filled 2012-01-27: qty 10

## 2012-01-27 MED ORDER — SODIUM CHLORIDE 0.9 % IV SOLN: Freq: Once | INTRAVENOUS | Status: DC

## 2012-01-27 MED ORDER — DOCETAXEL CHEMO INJECTION 160 MG/16ML
75.0000 mg/m2 | Freq: Once | INTRAVENOUS | Status: AC
  Administered 2012-01-27: 150 mg via INTRAVENOUS
  Filled 2012-01-27: qty 15

## 2012-01-27 MED ORDER — DEXAMETHASONE SODIUM PHOSPHATE 4 MG/ML IJ SOLN
20.0000 mg | Freq: Once | INTRAMUSCULAR | Status: AC
  Administered 2012-01-27: 20 mg via INTRAVENOUS

## 2012-01-27 MED ORDER — LORAZEPAM 2 MG/ML IJ SOLN: 0.5000 mg | INTRAMUSCULAR | Status: AC

## 2012-01-27 NOTE — Telephone Encounter (Signed)
gve the pt her aug 2013 appt calendar. The pt is aware to stop by the scheduling desk to pick up an updated appt calendar

## 2012-01-27 NOTE — Patient Instructions (Addendum)
Continue to follow up with the fluid next week

## 2012-01-27 NOTE — Patient Instructions (Signed)
[facility] Discharge Instructions for Patients Receiving Chemotherapy  Today you received the following chemotherapy agents Taxotere, Carboplatin and Herceptin  To help prevent nausea and vomiting after your treatment, we encourage you to take your nausea medication Begin taking it at 7 pm and take it as often as prescribed for the next 24 to 72 hours.   If you develop nausea and vomiting that is not controlled by your nausea medication, call the clinic. If it is after clinic hours your family physician or the after hours number for the clinic or go to the Emergency Department.   BELOW ARE SYMPTOMS THAT SHOULD BE REPORTED IMMEDIATELY:  *FEVER GREATER THAN 100.5 F  *CHILLS WITH OR WITHOUT FEVER  NAUSEA AND VOMITING THAT IS NOT CONTROLLED WITH YOUR NAUSEA MEDICATION  *UNUSUAL SHORTNESS OF BREATH  *UNUSUAL BRUISING OR BLEEDING  TENDERNESS IN MOUTH AND THROAT WITH OR WITHOUT PRESENCE OF ULCERS  *URINARY PROBLEMS  *BOWEL PROBLEMS  UNUSUAL RASH Items with * indicate a potential emergency and should be followed up as soon as possible.  One of the nurses will contact you 24 hours after your treatment. Please let the nurse know about any problems that you may have experienced. Feel free to call the clinic you have any questions or concerns. The clinic phone number is [phone].   I have been informed and understand all the instructions given to me. I know to contact the clinic, my physician, or go to the Emergency Department if any problems should occur. I do not have any questions at this time, but understand that I may call the clinic during office hours   should I have any questions or need assistance in obtaining follow up care.    __________________________________________  _____________  __________ Signature of Patient or Authorized Representative            Date                   Time    __________________________________________ Nurse's  Signature

## 2012-01-27 NOTE — Progress Notes (Signed)
OFFICE PROGRESS NOTE  CC  Julie Ross [address] [place] [address] Dr. Cyndia Bent Dr. Richardean Chimera Dr. Mardella Layman Dr. Gustavus Messing ([facility])  DIAGNOSIS: 51 year old female who originally was seen in the multidisciplinary breast clinic for discussion of new diagnosis of left breast cancer. She is now status post left mastectomy with the final pathology revealing an invasive ductal carcinoma that is ER positive PR positive HER-2/neu positive with Ki-67 11%.  PRIOR THERAPY:  #1 patient was originally seen in the multidisciplinary breast clinic on 11/30/2011 for what at that time looked like a ductal carcinoma in situ of the left breast. Due to the extent of disease she was recommended a mastectomy with axillary lymph node sampling.  #2 12/19/2011 patient had a left mastectomy with sentinel lymph node biopsy. Her final pathology revealed 2 foci of invasive ductal carcinoma measuring 0.8 and 0.6 cm. The tumor was low grade ER positive PR positive. Patient's tumor was found to be HER-2/neu positive with HER-2 ratio of 3.27. Ki-67 was 11%. Patient had sentinel lymph node  Biopsies performed one of 4 lymph nodes were positive for metastatic disease. Patient's final pathologic staging is T1 N1 M0. Stage II  CURRENT THERAPY: Patient is seen today for adjuvant Taxotere/Carboplatin with Herceptin. Cycle 1 day1  INTERVAL HISTORY: Julie Ross 51 y.o. female returns for Followup visit Prior to start of her chemotherapy. Overall she is doing well. She is obviously very nervous and anxious about the side effects she may possibly experienced with the chemotherapy as well as the Herceptin. She currently is denying any nausea vomiting fevers chills night sweats her vital signs are stable. She does have her baseline myalgias and arthralgias muscle weakness. She does have multiple sclerosis that is treated with Cymbalta. She has no bleeding port site  looks good no sites of infection. Remainder of the 10 point review of systems is negative MEDICAL HISTORY: Past Medical History  Diagnosis Date  . Hypertension   . Hypothyroidism   . Arthritis   . Neuromuscular disorder     MS TX WITH CYMBALTA   . Multiple sclerosis   . H/O colonoscopy   . H/O bone density study 03/2011  . Wears glasses   . Heart burn   . Depression   . Sleep apnea     MILD SLEEP APNEA, NO MACHINE  6 YRS AGO HPT [facility]   . Breast cancer     lt breast    ALLERGIES:   has no known allergies.  MEDICATIONS:  Current Outpatient Prescriptions  Medication Sig Dispense Refill  . baclofen (LIORESAL) 10 MG tablet Take 10 mg by mouth 3 (three) times daily.      Marland Kitchen CALCIUM-MAGNESIUM PO Take 1 tablet by mouth daily.      . clonazePAM (KLONOPIN) 0.5 MG tablet Take 0.5 mg by mouth at bedtime as needed. For sleep      . dexamethasone (DECADRON) 4 MG tablet Take 2 tablets two times a day the day before Taxotere. Then take 2 tabs two times a day starting the day after chemo for 3 days.  30 tablet  1  . DULoxetine (CYMBALTA) 60 MG capsule Take 60 mg by mouth daily.      Marland Kitchen esomeprazole (NEXIUM) 40 MG capsule Take 40 mg by mouth daily before breakfast.      . gabapentin (NEURONTIN) 300 MG capsule Take 600 mg by mouth 3 (three) times daily.       Marland Kitchen  HYDROcodone-acetaminophen (NORCO) 10-325 MG per tablet       . lidocaine-prilocaine (EMLA) cream Apply topically as needed.  30 g  0  . naproxen sodium (ANAPROX) 550 MG tablet Take 550 mg by mouth as needed.      Marland Kitchen UNABLE TO FIND Cranial Prothesis  1 each  0  . LORazepam (ATIVAN) 0.5 MG tablet Take 1 tablet (0.5 mg total) by mouth every 6 (six) hours as needed (Nausea or vomiting).  30 tablet  0  . losartan-hydrochlorothiazide (HYZAAR) 50-12.5 MG per tablet Take 1 tablet by mouth daily.      . Multiple Vitamins-Minerals (MULTIVITAMINS THER. W/MINERALS) TABS Take 1 tablet by mouth daily.      . ondansetron (ZOFRAN) 8 MG tablet Take 1  tablet two times a day starting the day after chemo for 3 days. Then take 1 tab two times a day as needed for nausea or vomiting.  30 tablet  1  . prochlorperazine (COMPAZINE) 10 MG tablet Take 1 tablet (10 mg total) by mouth every 6 (six) hours as needed (Nausea or vomiting).  30 tablet  1  . prochlorperazine (COMPAZINE) 25 MG suppository Place 1 suppository (25 mg total) rectally every 12 (twelve) hours as needed for nausea.  12 suppository  3    SURGICAL HISTORY:  Past Surgical History  Procedure Date  . Tonsillectomy   . Tumor removal     FROM PELVIS AGE 49  . Cesarean section     X2   . No past surgeries     BLADDER SLING  4-5 YRS AGO   . Knee arthroscopy     LT KNEE 04/2011  . Total knee arthroplasty 08/15/2011    Procedure: TOTAL KNEE ARTHROPLASTY; lft Surgeon: Harvie Junior, MD;  Location: [facility] OR;  Service: Orthopedics;  Laterality: Left;  RIGHT KNEE CORTIZONE INJECTION  . Mohs surgery   . Knee arthroscopy 82    rt  . Joint replacement Feb 2013  . Mastectomy w/ sentinel node biopsy 12/19/2011    Procedure: MASTECTOMY WITH SENTINEL LYMPH NODE BIOPSY;  Surgeon: Currie Paris, MD;  Location: [facility] OR;  Service: General;  Laterality: Left;  left breast and left axilla   . Portacath placement 01/16/2012    Procedure: INSERTION PORT-A-CATH;  Surgeon: Currie Paris, MD;  Location: [facility];  Service: General;  Laterality: Right;  Porta Cath Placement     REVIEW OF SYSTEMS:  Pertinent items are noted in HPI.   PHYSICAL EXAMINATION: General appearance: alert, cooperative and appears stated age Neck: no adenopathy, no carotid bruit, no JVD, supple, symmetrical, trachea midline and thyroid not enlarged, symmetric, no tenderness/mass/nodules Lymph nodes: Cervical, supraclavicular, and axillary nodes normal. Resp: clear to auscultation bilaterally and normal percussion bilaterally Back: symmetric, no curvature. ROM normal. No CVA tenderness. Cardio: regular rate and  rhythm, S1, S2 normal, no murmur, click, rub or gallop GI: soft, non-tender; bowel sounds normal; no masses,  no organomegaly Extremities: extremities normal, atraumatic, no cyanosis or edema Neurologic: Grossly normal  ECOG PERFORMANCE STATUS: 1 - Symptomatic but completely ambulatory  Blood pressure 133/75, pulse 71, temperature 98.4 F (36.9 C), temperature source Oral, resp. rate 20, height 5\' 6"  (1.676 m), weight 199 lb 11.2 oz (90.583 kg).  LABORATORY DATA: Lab Results  Component Value Date   WBC 6.5 01/27/2012   HGB 11.2* 01/27/2012   HCT 33.6* 01/27/2012   MCV 77.8* 01/27/2012   PLT 231 01/27/2012      Chemistry  Component Value Date/Time   NA 138 01/11/2012 1300   K 3.9 01/11/2012 1300   CL 99 01/11/2012 1300   CO2 27 01/11/2012 1300   BUN 11 01/11/2012 1300   CREATININE 0.89 01/11/2012 1300      Component Value Date/Time   CALCIUM 9.7 01/11/2012 1300   ALKPHOS 116 11/30/2011 1240   AST 14 11/30/2011 1240   ALT 11 11/30/2011 1240   BILITOT 0.3 11/30/2011 1240     DOB: May 19, 1961 Age: 85 Gender: F Client Name [facility] Collected Date: 12/19/2011 Received Date: 12/19/2011 Physician: Cyndia Bent Chart #: MRN # : [id number] Physician cc: Richardean Chimera, MD Sherian Rein, MD Race:W Visit #: [id number] Kaylyn Lim, RN REPORT OF SURGICAL PATHOLOGY ADDITIONAL INFORMATION: 5. PROGNOSTIC INDICATORS - ACIS Results IMMUNOHISTOCHEMICAL AND MORPHOMETRIC ANALYSIS BY THE AUTOMATED CELLULAR IMAGING SYSTEM (ACIS) Estrogen Receptor (Negative, <1%): 100%, STRONG STAINING INTENSITY Progesterone Receptor (Negative, <1%): 100%, STRONG STAINING INTENSITY Proliferation Marker Ki67 by M IB-1 (Low<20%): 11% All controls stained appropriately Pecola Leisure MD Pathologist, Electronic Signature ( Signed 12/29/2011) 5. CHROMOGENIC IN-SITU HYBRIDIZATION Interpretation: HER2/NEU BY CISH - SHOWS AMPLIFICATION BY CISH ANALYSIS. THE RATIO OF HER2: CEP 17 SIGNALS WAS  3.27 Reference range: Ratio: HER2:CEP17 < 1.8 gene amplification not observed Ratio: HER2:CEP 17 1.8-2.2 - equivocal result Ratio: HER2:CEP17 > 2.2 - gene amplification observed Pecola Leisure MD Pathologist, Electronic Signature ( Signed 12/27/2011) 1 of 4 FINAL for Perrone, Consuella M ([id number]) FINAL DIAGNOSIS Diagnosis 1. Lymph node, sentinel, biopsy, Left axillary - ONE BENIGN LYMPH NODE WITH NO TUMOR SEEN (0/1). 2. Lymph node, sentinel, biopsy, Left axilla - ONE LYMPH NODE POSITIVE FOR METASTATIC DUCTAL CARCINOMA (1/1). - SEE COMMENT. 3. Lymph node, sentinel, biopsy, Left axilla - ONE BENIGN LYMPH NODE WITH NO TUMOR SEEN (0/1). 4. Lymph node, sentinel, biopsy, Left axilla - ONE BENIGN LYMPH NODE WITH NO TUMOR SEEN (0/1). 5. Breast, simple mastectomy, Left - INVASIVE GRADE I DUCTAL CARCINOMA, TWO FOCI MEASURING 0.8 CM AND 0.6 CM ARISING IN A BACKGROUND OF EXTENSIVE INTERMEDIATE GRADE DUCTAL CARCINOMA IN SITU WITH NECROSIS. - MARGINS ARE NEGATIVE. - SEE ONCOLOGY TEMPLATE. Microscopic Comment 2. On re-review of the frozen section slides, no metastatic carcinoma is identified. The carcinoma present is on permanent sections only. 5. BREAST, INVASIVE TUMOR, WITH LYMPH NODE SAMPLING Specimen, including laterality: Left breast with sentinel lymph node. Procedure: Left simple mastectomy with sentinel lymph node biopsy. Grade: I (Both foci of tumors show similar morphology). Tubule formation: 2. Nuclear pleomorphism: 2. Mitotic: 1. Tumor size (glass slide measurement): Two foci measuring 0.8 cm and 0.6 cm. Margins: Invasive, distance to closest margin: At least 1.5 cm. In-situ, distance to closest margin: At least 1.5 cm. Lymphovascular invasion: No definite lymph/vascular invasion is not identified however a sentinel lymph node is positive (see below). Ductal carcinoma in situ: Yes. Grade: Intermediate grade. Extensive intraductal component: Yes. Lobular neoplasia: Not  identified. Tumor focality: Two foci. Treatment effect: Not applicable. Extent of tumor: Tumor confined to breast parenchyma. Lymph nodes: # examined: 4. Lymph nodes with metastasis: 1. Macrometastasis: (> 2.0 mm): 1. Extracapsular extension: Not identified. Breast prognostic profile: A breast prognostic profile will be performed (see below comment). Additional breast findings: Fibrocystic changes with usual ductal hyperplasia. TNM: mpT1b, pN1a, MX. Comment: The palpable nodular area seen grossly is comprised predominantly of intermediate grade ductal carcinoma in situ with only two foci of invasive ductal carcinoma. Both invasive foci are morphologically similar. As such a breast prognostic profile will only be  performed on the larger tumor. A breast prognostic profile can be performed upon the smaller tumor per request. (RH:kh 12-21-11) 2 of 4 FINAL for Rosasco, Cherissa M ([id number]) Microscopic Comment(continued) Zandra Abts MD Pathologist, Electronic Signature (Case signed 12/21/2011) Intraoperative Diagnosis 1. RECEIVED FRESH IS TISSUE THAT CONTAINS A LYMPH NODE WHICH IS SAMPLED FOR RIOC. LEFT AXILLARY SENTINEL LYMPH NODE, FROZEN SECTION AND TOUCH PREPARATION- ONE LYMPH NODE WITH NO METASTATIC CARCINOMA IDENTIFIED. (RAH) 2. RECEIVED FRESH IS TISSUE THAT CONTAINS A LYMPH NODE WHICH IS SAMPLED FOR RIOC. LEFT AXILLARY SENTINEL LYMPH NODE, FROZEN SECTION AND TOUCH PREPARATION- ONE LYMPH NODE WITH NO METASTATIC CARCINOMA IDENTIFIED. (RAH) 3. RECEIVED FRESH IS TISSUE THAT CONTAINS A LYMPH NODE WHICH IS SAMPLED FOR RIOC. LEFT AXILLARY SENTINEL LYMPH NODE, FROZEN SECTION AND TOUCH PREPARATION- ONE LYMPH NODE WITH NO METASTATIC CARCINOMA IDENTIFIED. (RAH) 4. RECEIVED FRESH IS TISSUE THAT CONTAINS A LYMPH NODE WHICH IS SAMPLED FOR RIOC. LEFT AXILLARY SENTINEL LYMPH NODE, FROZEN SECTION AND TOUCH PREPARATION- ONE LYMPH NODE WITH NO METASTATIC CARCINOMA IDENTIFIED. (RAH) Specimen Gross and  Clinical Information Specimen(s) Obtained: 1. Lymph node, sentinel, biopsy, Left axillary 2. Lymph node, sentinel, biopsy, Left axilla 3. Lymph node, sentinel, biopsy, Left axilla 4. Lymph node, sentinel, biopsy, Left axilla 5. Breast, simple mastectomy, Left Specimen Clinical Information 1. Left breast cancer (tl) Gross 1. Rapid Intraoperative Consult performed (Yes or No): Yes. Specimen: Left axillary sentinel node. Number and size: One node, 2.2 cm Cut Surface(s): Soft, fatty, focally blue. Block Summary: The node is sectioned, touch preparations are made on one slide, and the tissue is submitted in one block labeled SLN for frozen section. 2. Rapid Intraoperative Consult performed (Yes or No): Yes. Specimen: Left axillary sentinel node. Number and size: One node, 2.1 cm. Cut Surface(s): Soft, fatty, focally blue. Block Summary: The node is bisected, touch preparations are made on one slide, and is submitted in one block labeled SLN for frozen section. 3. Rapid Intraoperative Consult performed (Yes or No): Yes. Specimen: Left axillary sentinel node. Number and size: One node, 0.7 cm Cut Surface(s): Soft, fatty, focally blue. Block Summary: The node is bisected, touch preparations are made on one slide, and is submitted in one block labeled SLN for frozen section. 4. Rapid Intraoperative Consult performed (Yes or No): Yes. Specimen: Left axillary sentinel node. Number and size: One node, 1.5 cm. 3 of 4 FINAL for Cavitt, Juanetta M ([id number]) Gross(continued) Cut Surface(s): Soft, fatty, non blue. Block Summary: The node is bisected, touch preparations are made on one slide and is submitted in one block labeled SLN for frozen section. (SW:gt, 12/20/11) 5. Specimen: Left simple mastectomy, received fresh. The specimen was placed in formalin at 2:50 p.m. on 12/19/11. Specimen integrity (intact/disrupted): Intact. Weight: 1185 grams. Size: 28 x 24.5 x 7 cm. Skin: There is an  attached 16 x 7.5 cm ellipse of skin with a central, unremarkable nipple. There is a suture attached at the superior edge. Tumor/cavity: In the mid central to mid lateral breast there is an ill defined area of palpable nodular fibrous tissue without a discrete mass identified. This area measures approximately 7 x 6 x 3 cm and extends to within 1.5 cm of the deep margin. Within this area there is a 1.5 cm cavitary defect, consistent with a previous biopsy site. There is a silver metallic biopsy clip present at the defect. Uninvolved parenchyma: The remainder of the breast tissue consists of fat and dense white fibrous tissue. Prognostic indicators: Obtain from paraffin blocks if  needed. Lymph nodes: There are no lymph nodes identified. Block summary: 13 blocks submitted. A = deep margin at nodular area. B - I = sections of nodular area, sequentially labeled from lateral through medial, margins not included. J = lower medial. K = upper medial. L = lower lateral. M = upper lateral. (GP:eps 12/20/11)  RADIOGRAPHIC STUDIES:  Nm Sentinel Node Inj-no Rpt (breast)  12/19/2011  CLINICAL DATA: breast cancer   Sulfur colloid was injected intradermally by the nuclear medicine  technologist for breast cancer sentinel node localization.      ASSESSMENT: 51 year old female with  #1 stage II invasive ductal carcinoma of the left breast with ductal carcinoma in situ. Patient is status post mastectomy that showed 2 foci of invasive cancer measuring 0.8 0.6 cm. The tumor is ER positive PR positive HER-2/neu amplified at 3.27. Patient is now seen in medical oncology for discussion of adjuvant treatment. As mentioned above her case was discussed at the multidisciplinary breast conference. Patient is noted to have one of 4 lymph nodes positive for metastatic disease. This was only axillary sentinel node biopsy. A discussion of whether patient under should undergo a full axillary lymph node dissection did take place  but at the end of our extensive discussion it was recommended the patient will proceed with radiation and DOS she would feel the criteria of seed 11. Therefore is excellent lymph node dissection is not being done. Instead the patient will proceed with adjuvant chemotherapy and Herceptin. Her chemotherapy will consist of Taxotere carboplatinum given every 21 days. For the duration of her chemotherapy patient will get Herceptin on a weekly basis. Once she completes the chemotherapy she then will get Herceptin every 3 weeks. After completion of chemotherapy patient will proceed with radiation therapy with concurrent Herceptin. After completion of radiation she will start Herceptin and antiestrogen therapy consisting of either tamoxifen or an aromatase inhibitor.  #2 patient does have history of multiple sclerosis and fatigue related to that. Certainly she will get fatigued from her chemotherapy as well we will keep her neurologist very much in the [place] to help manage her symptoms.   PLAN:   #1 We will proceed with cycle 1 of Taxotere carboplatinum and Herceptin today. She will return in 24 hours for her Neulasta. Risks and benefits of chemotherapy are discussed with the patient and her husband.  #2 I will plan on seeing her back in one week's time for interim lapse and Herceptin only.  All questions were answered. The patient knows to call the clinic with any problems, questions or concerns. We can certainly see the patient much sooner if necessary.  I spent 25 minutes counseling the patient face to face. The total time spent in the appointment was 30 minutes.    Drue Second, MD Medical/Oncology [facility] [phone] (beeper) [phone] (Office)  01/27/2012, 10:02 AM

## 2012-01-27 NOTE — Patient Instructions (Addendum)
Proceed with chemotherapy today.  Return in 1 week for herceptin

## 2012-01-27 NOTE — Progress Notes (Signed)
CC: F/U Seroma  Re- accumulated again - aspirated 40 cc this time. Re check next week. Starting chemo today

## 2012-01-28 ENCOUNTER — Ambulatory Visit (HOSPITAL_BASED_OUTPATIENT_CLINIC_OR_DEPARTMENT_OTHER): Payer: BC Managed Care – PPO

## 2012-01-28 VITALS — BP 132/82 | HR 78 | Temp 97.7°F | Resp 18

## 2012-01-28 DIAGNOSIS — Z5189 Encounter for other specified aftercare: Secondary | ICD-10-CM

## 2012-01-28 DIAGNOSIS — C50419 Malignant neoplasm of upper-outer quadrant of unspecified female breast: Secondary | ICD-10-CM

## 2012-01-28 MED ORDER — PEGFILGRASTIM INJECTION 6 MG/0.6ML
6.0000 mg | Freq: Once | SUBCUTANEOUS | Status: AC
  Administered 2012-01-28: 6 mg via SUBCUTANEOUS

## 2012-01-28 NOTE — Patient Instructions (Addendum)

## 2012-01-30 ENCOUNTER — Telehealth: Payer: Self-pay | Admitting: *Deleted

## 2012-01-30 NOTE — Telephone Encounter (Signed)
Message copied by Augusto Garbe on Mon Jan 30, 2012  2:21 PM ------      Message from: Melton Krebs D      Created: Fri Jan 27, 2012  3:53 PM      Regarding: 1st Taxotere/Herceptin/Carboplatin (Dr. Welton Flakes)      Contact: [phone]       Julie Ross received her 1st chemo treatment on Friday. Would you please follow up with her? Thank You,

## 2012-01-30 NOTE — Telephone Encounter (Signed)
Received first taxotere, herceptin and carboplatin on Friday.  Neulasta injection given on Saturday.  Julie Ross says she feels aches that started on Saturday evening.  Taking hydrocodone she received from her neurologist with no relief.  Will notify providers. No further problems with further assessment.  Denies n/v, problems with bowel or bladder or any other side effects.  Denies questions at this time.

## 2012-01-31 ENCOUNTER — Ambulatory Visit (INDEPENDENT_AMBULATORY_CARE_PROVIDER_SITE_OTHER): Payer: BC Managed Care – PPO | Admitting: General Surgery

## 2012-01-31 ENCOUNTER — Encounter (INDEPENDENT_AMBULATORY_CARE_PROVIDER_SITE_OTHER): Payer: Self-pay | Admitting: General Surgery

## 2012-01-31 VITALS — BP 136/84 | HR 74 | Temp 97.8°F | Resp 16 | Ht 66.0 in | Wt 200.1 lb

## 2012-01-31 DIAGNOSIS — C50419 Malignant neoplasm of upper-outer quadrant of unspecified female breast: Secondary | ICD-10-CM

## 2012-01-31 NOTE — Patient Instructions (Signed)
I drained 55 cc of clear fluid from the mastectomy wound today. There is no sign of infection.  Keep Julie Ross appointment with Dr. Maud Deed on August 19.

## 2012-01-31 NOTE — Progress Notes (Signed)
Patient ID: KENYATA NAPIER, female   DOB: 1960/12/16, 51 y.o.   MRN: [id number]  This patient underwent left modified radical mastectomy by Dr. Jamey Ripa on  July 1. Port-A-Cath was inserted on July 29. She has had recurrent seromas in the mastectomy wound. Last visit, Aug. 9, drained 40 cc. She feels fluid again.  Exam: Patient is in no distress. Left mastectomy wound shows no sign of infection. There was a seroma laterally and I aspirated 55 cc and placed a Band-Aid.  Plan: She will see Dr. Jamey Ripa on August 19. She may or may not need a drain placed.  Angelia Mould. Derrell Lolling, M.D., [facility] General and Minimally invasive Surgery Breast and Colorectal Surgery Office:   [phone] Pager:   [phone]

## 2012-02-03 ENCOUNTER — Ambulatory Visit (HOSPITAL_BASED_OUTPATIENT_CLINIC_OR_DEPARTMENT_OTHER): Payer: BC Managed Care – PPO | Admitting: Oncology

## 2012-02-03 ENCOUNTER — Other Ambulatory Visit (HOSPITAL_BASED_OUTPATIENT_CLINIC_OR_DEPARTMENT_OTHER): Payer: BC Managed Care – PPO | Admitting: Lab

## 2012-02-03 ENCOUNTER — Other Ambulatory Visit: Payer: Self-pay | Admitting: Oncology

## 2012-02-03 ENCOUNTER — Telehealth: Payer: Self-pay | Admitting: *Deleted

## 2012-02-03 ENCOUNTER — Encounter: Payer: Self-pay | Admitting: Oncology

## 2012-02-03 ENCOUNTER — Ambulatory Visit (HOSPITAL_BASED_OUTPATIENT_CLINIC_OR_DEPARTMENT_OTHER): Payer: BC Managed Care – PPO

## 2012-02-03 VITALS — BP 131/75 | HR 92 | Temp 98.1°F | Resp 20 | Ht 66.0 in | Wt 197.4 lb

## 2012-02-03 DIAGNOSIS — C50419 Malignant neoplasm of upper-outer quadrant of unspecified female breast: Secondary | ICD-10-CM

## 2012-02-03 DIAGNOSIS — Z5112 Encounter for antineoplastic immunotherapy: Secondary | ICD-10-CM

## 2012-02-03 DIAGNOSIS — C773 Secondary and unspecified malignant neoplasm of axilla and upper limb lymph nodes: Secondary | ICD-10-CM

## 2012-02-03 DIAGNOSIS — E860 Dehydration: Secondary | ICD-10-CM

## 2012-02-03 DIAGNOSIS — R110 Nausea: Secondary | ICD-10-CM

## 2012-02-03 LAB — CBC WITH DIFFERENTIAL/PLATELET
BASO%: 0.1 % (ref 0.0–2.0)
Basophils Absolute: 0 10*3/uL (ref 0.0–0.1)
EOS%: 1 % (ref 0.0–7.0)
Eosinophils Absolute: 0.1 10*3/uL (ref 0.0–0.5)
HCT: 36.2 % (ref 34.8–46.6)
HGB: 12 g/dL (ref 11.6–15.9)
LYMPH%: 23.6 % (ref 14.0–49.7)
MCH: 25.9 pg (ref 25.1–34.0)
MCHC: 33.1 g/dL (ref 31.5–36.0)
MCV: 78 fL — ABNORMAL LOW (ref 79.5–101.0)
MONO#: 0.9 10*3/uL (ref 0.1–0.9)
MONO%: 12.8 % (ref 0.0–14.0)
NEUT#: 4.5 10*3/uL (ref 1.5–6.5)
NEUT%: 62.5 % (ref 38.4–76.8)
Platelets: 150 10*3/uL (ref 145–400)
RBC: 4.64 10*6/uL (ref 3.70–5.45)
RDW: 13.9 % (ref 11.2–14.5)
WBC: 7.2 10*3/uL (ref 3.9–10.3)
lymph#: 1.7 10*3/uL (ref 0.9–3.3)

## 2012-02-03 LAB — COMPREHENSIVE METABOLIC PANEL
ALT: 57 U/L — ABNORMAL HIGH (ref 0–35)
AST: 42 U/L — ABNORMAL HIGH (ref 0–37)
Albumin: 4.1 g/dL (ref 3.5–5.2)
Alkaline Phosphatase: 183 U/L — ABNORMAL HIGH (ref 39–117)
BUN: 14 mg/dL (ref 6–23)
CO2: 29 mEq/L (ref 19–32)
Calcium: 8.9 mg/dL (ref 8.4–10.5)
Chloride: 99 mEq/L (ref 96–112)
Creatinine, Ser: 0.93 mg/dL (ref 0.50–1.10)
Glucose, Bld: 111 mg/dL — ABNORMAL HIGH (ref 70–99)
Potassium: 3.9 mEq/L (ref 3.5–5.3)
Sodium: 139 mEq/L (ref 135–145)
Total Bilirubin: 0.4 mg/dL (ref 0.3–1.2)
Total Protein: 6.6 g/dL (ref 6.0–8.3)

## 2012-02-03 MED ORDER — ACETAMINOPHEN 325 MG PO TABS
650.0000 mg | ORAL_TABLET | Freq: Once | ORAL | Status: AC
  Administered 2012-02-03: 650 mg via ORAL

## 2012-02-03 MED ORDER — SODIUM CHLORIDE 0.9 % IJ SOLN
10.0000 mL | INTRAMUSCULAR | Status: DC | PRN
Start: 2012-02-03 — End: 2012-02-03
  Administered 2012-02-03: 10 mL
  Filled 2012-02-03: qty 10

## 2012-02-03 MED ORDER — SODIUM CHLORIDE 0.9 % IV SOLN
Freq: Once | INTRAVENOUS | Status: AC
  Administered 2012-02-03: 13:00:00 via INTRAVENOUS

## 2012-02-03 MED ORDER — TRASTUZUMAB CHEMO INJECTION 440 MG
2.0000 mg/kg | Freq: Once | INTRAVENOUS | Status: AC
  Administered 2012-02-03: 189 mg via INTRAVENOUS
  Filled 2012-02-03: qty 9

## 2012-02-03 MED ORDER — DIPHENHYDRAMINE HCL 25 MG PO CAPS
50.0000 mg | ORAL_CAPSULE | Freq: Once | ORAL | Status: AC
  Administered 2012-02-03: 50 mg via ORAL

## 2012-02-03 MED ORDER — ONDANSETRON 8 MG/50ML IVPB (CHCC)
8.0000 mg | Freq: Once | INTRAVENOUS | Status: AC
  Administered 2012-02-03: 8 mg via INTRAVENOUS

## 2012-02-03 MED ORDER — SODIUM CHLORIDE 0.9 % IV SOLN: INTRAVENOUS | Status: DC

## 2012-02-03 MED ORDER — HEPARIN SOD (PORK) LOCK FLUSH 100 UNIT/ML IV SOLN
500.0000 [IU] | Freq: Once | INTRAVENOUS | Status: AC | PRN
  Administered 2012-02-03: 500 [IU]
  Filled 2012-02-03: qty 5

## 2012-02-03 MED ORDER — LORAZEPAM 2 MG/ML IJ SOLN
0.5000 mg | Freq: Once | INTRAMUSCULAR | Status: AC
  Administered 2012-02-03: 0.5 mg via INTRAVENOUS

## 2012-02-03 NOTE — Telephone Encounter (Signed)
Per staff message and POF I have scheduled appts. Patient given schedule. JMW

## 2012-02-03 NOTE — Patient Instructions (Addendum)
[facility] Discharge Instructions for Patients Receiving Chemotherapy  Today you received the following chemotherapy agents  Herceptin.     If you develop nausea and vomiting that is not controlled by your nausea medication, call the clinic. If it is after clinic hours your family physician or the after hours number for the clinic or go to the Emergency Department.   BELOW ARE SYMPTOMS THAT SHOULD BE REPORTED IMMEDIATELY:  *FEVER GREATER THAN 100.5 F  *CHILLS WITH OR WITHOUT FEVER  NAUSEA AND VOMITING THAT IS NOT CONTROLLED WITH YOUR NAUSEA MEDICATION  *UNUSUAL SHORTNESS OF BREATH  *UNUSUAL BRUISING OR BLEEDING  TENDERNESS IN MOUTH AND THROAT WITH OR WITHOUT PRESENCE OF ULCERS  *URINARY PROBLEMS  *BOWEL PROBLEMS  UNUSUAL RASH Items with * indicate a potential emergency and should be followed up as soon as possible.  . Feel free to call the clinic you have any questions or concerns. The clinic phone number is [phone].   I have been informed and understand all the instructions given to me. I know to contact the clinic, my physician, or go to the Emergency Department if any problems should occur. I do not have any questions at this time, but understand that I may call the clinic during office hours   should I have any questions or need assistance in obtaining follow up care.    __________________________________________  _____________  __________ Signature of Patient or Authorized Representative            Date                   Time    __________________________________________ Nurse's Signature    

## 2012-02-03 NOTE — Patient Instructions (Addendum)
Proceed with herceptin and IVF today, zofran and ativan will also be given  Return on 8/18 for zofran and IVF

## 2012-02-03 NOTE — Progress Notes (Signed)
Put Aflac form on nurse's desk °

## 2012-02-03 NOTE — Progress Notes (Signed)
OFFICE PROGRESS NOTE  CC  Julie Ross [address] [place] [address] Dr. Cyndia Bent Dr. Richardean Chimera Dr. Mardella Layman Dr. Gustavus Messing ([facility])  DIAGNOSIS: 51 year old female who originally was seen in the multidisciplinary breast clinic for discussion of new diagnosis of left breast cancer. She is now status post left mastectomy with the final pathology revealing an invasive ductal carcinoma that is ER positive PR positive HER-2/neu positive with Ki-67 11%.  PRIOR THERAPY:  #1 patient was originally seen in the multidisciplinary breast clinic on 11/30/2011 for what at that time looked like a ductal carcinoma in situ of the left breast. Due to the extent of disease she was recommended a mastectomy with axillary lymph node sampling.  #2 12/19/2011 patient had a left mastectomy with sentinel lymph node biopsy. Her final pathology revealed 2 foci of invasive ductal carcinoma measuring 0.8 and 0.6 cm. The tumor was low grade ER positive PR positive. Patient's tumor was found to be HER-2/neu positive with HER-2 ratio of 3.27. Ki-67 was 11%. Patient had sentinel lymph node  Biopsies performed one of 4 lymph nodes were positive for metastatic disease. Patient's final pathologic staging is T1 N1 M0. Stage II  #3 patient is receiving adjuvant chemotherapy consisting of Taxotere carboplatinum administered every 21 days with weekly Herceptin. A total of 4 cycles of Taxotere carboplatinum are planned.  CURRENT THERAPY: Patient is here for Herceptin only today  INTERVAL HISTORY: Julie Ross 51 y.o. female returns for Followup visit. Patient has several complaints including nausea and vomiting she was not able to take the anti-emetics as prescribed and we explained to her how to take them again. She is also complaining of itching and pain in the rectal area although there is no findings of any type of infections or irritation. She does have a  history of hemorrhoids and she does like feel like inflammation of the hemorrhoids. She is nauseous today she is very weak and tired. She does not have any diarrhea or constipation no tingling or numbness she does have weakness. She has not been drinking as well as she should she seems to be dehydrated as well today. Her by mouth intake is suboptimal at this time. She has not had any fevers or chills or night sweats. Remainder of the 10 point review of systems is negative.  MEDICAL HISTORY: Past Medical History  Diagnosis Date  . Hypertension   . Hypothyroidism   . Arthritis   . Neuromuscular disorder     MS TX WITH CYMBALTA   . Multiple sclerosis   . H/O colonoscopy   . H/O bone density study 03/2011  . Wears glasses   . Heart burn   . Depression   . Sleep apnea     MILD SLEEP APNEA, NO MACHINE  6 YRS AGO HPT [facility]   . Breast cancer     lt breast    ALLERGIES:   has no known allergies.  MEDICATIONS:  Current Outpatient Prescriptions  Medication Sig Dispense Refill  . baclofen (LIORESAL) 10 MG tablet Take 10 mg by mouth 3 (three) times daily.      Marland Kitchen CALCIUM-MAGNESIUM PO Take 1 tablet by mouth daily.      . clonazePAM (KLONOPIN) 0.5 MG tablet Take 0.5 mg by mouth at bedtime as needed. For sleep      . dexamethasone (DECADRON) 4 MG tablet Take 2 tablets two times a day the day before Taxotere. Then take 2 tabs  two times a day starting the day after chemo for 3 days.  30 tablet  1  . DULoxetine (CYMBALTA) 60 MG capsule Take 60 mg by mouth daily.      Marland Kitchen esomeprazole (NEXIUM) 40 MG capsule Take 40 mg by mouth daily before breakfast.      . gabapentin (NEURONTIN) 300 MG capsule Take 600 mg by mouth 3 (three) times daily.       Marland Kitchen HYDROcodone-acetaminophen (NORCO) 10-325 MG per tablet       . lidocaine-prilocaine (EMLA) cream Apply topically as needed.  30 g  0  . LORazepam (ATIVAN) 0.5 MG tablet Take 1 tablet (0.5 mg total) by mouth every 6 (six) hours as needed (Nausea or vomiting).   30 tablet  0  . losartan-hydrochlorothiazide (HYZAAR) 50-12.5 MG per tablet Take 1 tablet by mouth daily.      . Multiple Vitamins-Minerals (MULTIVITAMINS THER. W/MINERALS) TABS Take 1 tablet by mouth daily.      . naproxen sodium (ANAPROX) 550 MG tablet Take 550 mg by mouth as needed.      . ondansetron (ZOFRAN) 8 MG tablet Take 1 tablet two times a day starting the day after chemo for 3 days. Then take 1 tab two times a day as needed for nausea or vomiting.  30 tablet  1  . prochlorperazine (COMPAZINE) 10 MG tablet Take 1 tablet (10 mg total) by mouth every 6 (six) hours as needed (Nausea or vomiting).  30 tablet  1  . prochlorperazine (COMPAZINE) 25 MG suppository Place 1 suppository (25 mg total) rectally every 12 (twelve) hours as needed for nausea.  12 suppository  3  . UNABLE TO FIND Cranial Prothesis  1 each  0    SURGICAL HISTORY:  Past Surgical History  Procedure Date  . Tonsillectomy   . Tumor removal     FROM PELVIS AGE 61  . Cesarean section     X2   . No past surgeries     BLADDER SLING  4-5 YRS AGO   . Knee arthroscopy     LT KNEE 04/2011  . Total knee arthroplasty 08/15/2011    Procedure: TOTAL KNEE ARTHROPLASTY; lft Surgeon: Harvie Junior, MD;  Location: [facility] OR;  Service: Orthopedics;  Laterality: Left;  RIGHT KNEE CORTIZONE INJECTION  . Mohs surgery   . Knee arthroscopy 82    rt  . Joint replacement Feb 2013  . Mastectomy w/ sentinel node biopsy 12/19/2011    Procedure: MASTECTOMY WITH SENTINEL LYMPH NODE BIOPSY;  Surgeon: Currie Paris, MD;  Location: [facility] OR;  Service: General;  Laterality: Left;  left breast and left axilla   . Portacath placement 01/16/2012    Procedure: INSERTION PORT-A-CATH;  Surgeon: Currie Paris, MD;  Location: [facility];  Service: General;  Laterality: Right;  Porta Cath Placement     REVIEW OF SYSTEMS:  Pertinent items are noted in HPI.   PHYSICAL EXAMINATION: General appearance: alert, cooperative and appears  stated age Neck: no adenopathy, no carotid bruit, no JVD, supple, symmetrical, trachea midline and thyroid not enlarged, symmetric, no tenderness/mass/nodules Lymph nodes: Cervical, supraclavicular, and axillary nodes normal. Resp: clear to auscultation bilaterally and normal percussion bilaterally Back: symmetric, no curvature. ROM normal. No CVA tenderness. Cardio: regular rate and rhythm, S1, S2 normal, no murmur, click, rub or gallop GI: soft, non-tender; bowel sounds normal; no masses,  no organomegaly Extremities: extremities normal, atraumatic, no cyanosis or edema Neurologic: Grossly normal  ECOG PERFORMANCE STATUS:  1 - Symptomatic but completely ambulatory  Blood pressure 131/75, pulse 92, temperature 98.1 F (36.7 C), resp. rate 20, height 5\' 6"  (1.676 m), weight 197 lb 6.4 oz (89.54 kg).  LABORATORY DATA: Lab Results  Component Value Date   WBC 7.2 02/03/2012   HGB 12.0 02/03/2012   HCT 36.2 02/03/2012   MCV 78.0* 02/03/2012   PLT 150 02/03/2012      Chemistry      Component Value Date/Time   NA 139 01/27/2012 0901   K 3.8 01/27/2012 0901   CL 102 01/27/2012 0901   CO2 28 01/27/2012 0901   BUN 14 01/27/2012 0901   CREATININE 1.00 01/27/2012 0901      Component Value Date/Time   CALCIUM 9.0 01/27/2012 0901   ALKPHOS 139* 01/27/2012 0901   AST 17 01/27/2012 0901   ALT 16 01/27/2012 0901   BILITOT 0.4 01/27/2012 0901     DOB: March 15, 1961 Age: 65 Gender: F Client Name [facility] Collected Date: 12/19/2011 Received Date: 12/19/2011 Physician: Cyndia Bent Chart #: MRN # : [id number] Physician cc: Richardean Chimera, MD Sherian Rein, MD Race:W Visit #: [id number] Kaylyn Lim, RN REPORT OF SURGICAL PATHOLOGY ADDITIONAL INFORMATION: 5. PROGNOSTIC INDICATORS - ACIS Results IMMUNOHISTOCHEMICAL AND MORPHOMETRIC ANALYSIS BY THE AUTOMATED CELLULAR IMAGING SYSTEM (ACIS) Estrogen Receptor (Negative, <1%): 100%, STRONG STAINING INTENSITY Progesterone Receptor (Negative, <1%):  100%, STRONG STAINING INTENSITY Proliferation Marker Ki67 by M IB-1 (Low<20%): 11% All controls stained appropriately Pecola Leisure MD Pathologist, Electronic Signature ( Signed 12/29/2011) 5. CHROMOGENIC IN-SITU HYBRIDIZATION Interpretation: HER2/NEU BY CISH - SHOWS AMPLIFICATION BY CISH ANALYSIS. THE RATIO OF HER2: CEP 17 SIGNALS WAS 3.27 Reference range: Ratio: HER2:CEP17 < 1.8 gene amplification not observed Ratio: HER2:CEP 17 1.8-2.2 - equivocal result Ratio: HER2:CEP17 > 2.2 - gene amplification observed Pecola Leisure MD Pathologist, Electronic Signature ( Signed 12/27/2011) 1 of 4 FINAL for Toothaker, Saraiyah M ([id number]) FINAL DIAGNOSIS Diagnosis 1. Lymph node, sentinel, biopsy, Left axillary - ONE BENIGN LYMPH NODE WITH NO TUMOR SEEN (0/1). 2. Lymph node, sentinel, biopsy, Left axilla - ONE LYMPH NODE POSITIVE FOR METASTATIC DUCTAL CARCINOMA (1/1). - SEE COMMENT. 3. Lymph node, sentinel, biopsy, Left axilla - ONE BENIGN LYMPH NODE WITH NO TUMOR SEEN (0/1). 4. Lymph node, sentinel, biopsy, Left axilla - ONE BENIGN LYMPH NODE WITH NO TUMOR SEEN (0/1). 5. Breast, simple mastectomy, Left - INVASIVE GRADE I DUCTAL CARCINOMA, TWO FOCI MEASURING 0.8 CM AND 0.6 CM ARISING IN A BACKGROUND OF EXTENSIVE INTERMEDIATE GRADE DUCTAL CARCINOMA IN SITU WITH NECROSIS. - MARGINS ARE NEGATIVE. - SEE ONCOLOGY TEMPLATE. Microscopic Comment 2. On re-review of the frozen section slides, no metastatic carcinoma is identified. The carcinoma present is on permanent sections only. 5. BREAST, INVASIVE TUMOR, WITH LYMPH NODE SAMPLING Specimen, including laterality: Left breast with sentinel lymph node. Procedure: Left simple mastectomy with sentinel lymph node biopsy. Grade: I (Both foci of tumors show similar morphology). Tubule formation: 2. Nuclear pleomorphism: 2. Mitotic: 1. Tumor size (glass slide measurement): Two foci measuring 0.8 cm and 0.6 cm. Margins: Invasive, distance to closest  margin: At least 1.5 cm. In-situ, distance to closest margin: At least 1.5 cm. Lymphovascular invasion: No definite lymph/vascular invasion is not identified however a sentinel lymph node is positive (see below). Ductal carcinoma in situ: Yes. Grade: Intermediate grade. Extensive intraductal component: Yes. Lobular neoplasia: Not identified. Tumor focality: Two foci. Treatment effect: Not applicable. Extent of tumor: Tumor confined to breast parenchyma. Lymph nodes: # examined: 4. Lymph nodes with  metastasis: 1. Macrometastasis: (> 2.0 mm): 1. Extracapsular extension: Not identified. Breast prognostic profile: A breast prognostic profile will be performed (see below comment). Additional breast findings: Fibrocystic changes with usual ductal hyperplasia. TNM: mpT1b, pN1a, MX. Comment: The palpable nodular area seen grossly is comprised predominantly of intermediate grade ductal carcinoma in situ with only two foci of invasive ductal carcinoma. Both invasive foci are morphologically similar. As such a breast prognostic profile will only be performed on the larger tumor. A breast prognostic profile can be performed upon the smaller tumor per request. (RH:kh 12-21-11) 2 of 4 FINAL for Sandstrom, Michell M ([id number]) Microscopic Comment(continued) Zandra Abts MD Pathologist, Electronic Signature (Case signed 12/21/2011) Intraoperative Diagnosis 1. RECEIVED FRESH IS TISSUE THAT CONTAINS A LYMPH NODE WHICH IS SAMPLED FOR RIOC. LEFT AXILLARY SENTINEL LYMPH NODE, FROZEN SECTION AND TOUCH PREPARATION- ONE LYMPH NODE WITH NO METASTATIC CARCINOMA IDENTIFIED. (RAH) 2. RECEIVED FRESH IS TISSUE THAT CONTAINS A LYMPH NODE WHICH IS SAMPLED FOR RIOC. LEFT AXILLARY SENTINEL LYMPH NODE, FROZEN SECTION AND TOUCH PREPARATION- ONE LYMPH NODE WITH NO METASTATIC CARCINOMA IDENTIFIED. (RAH) 3. RECEIVED FRESH IS TISSUE THAT CONTAINS A LYMPH NODE WHICH IS SAMPLED FOR RIOC. LEFT AXILLARY SENTINEL LYMPH NODE,  FROZEN SECTION AND TOUCH PREPARATION- ONE LYMPH NODE WITH NO METASTATIC CARCINOMA IDENTIFIED. (RAH) 4. RECEIVED FRESH IS TISSUE THAT CONTAINS A LYMPH NODE WHICH IS SAMPLED FOR RIOC. LEFT AXILLARY SENTINEL LYMPH NODE, FROZEN SECTION AND TOUCH PREPARATION- ONE LYMPH NODE WITH NO METASTATIC CARCINOMA IDENTIFIED. (RAH) Specimen Gross and Clinical Information Specimen(s) Obtained: 1. Lymph node, sentinel, biopsy, Left axillary 2. Lymph node, sentinel, biopsy, Left axilla 3. Lymph node, sentinel, biopsy, Left axilla 4. Lymph node, sentinel, biopsy, Left axilla 5. Breast, simple mastectomy, Left Specimen Clinical Information 1. Left breast cancer (tl) Gross 1. Rapid Intraoperative Consult performed (Yes or No): Yes. Specimen: Left axillary sentinel node. Number and size: One node, 2.2 cm Cut Surface(s): Soft, fatty, focally blue. Block Summary: The node is sectioned, touch preparations are made on one slide, and the tissue is submitted in one block labeled SLN for frozen section. 2. Rapid Intraoperative Consult performed (Yes or No): Yes. Specimen: Left axillary sentinel node. Number and size: One node, 2.1 cm. Cut Surface(s): Soft, fatty, focally blue. Block Summary: The node is bisected, touch preparations are made on one slide, and is submitted in one block labeled SLN for frozen section. 3. Rapid Intraoperative Consult performed (Yes or No): Yes. Specimen: Left axillary sentinel node. Number and size: One node, 0.7 cm Cut Surface(s): Soft, fatty, focally blue. Block Summary: The node is bisected, touch preparations are made on one slide, and is submitted in one block labeled SLN for frozen section. 4. Rapid Intraoperative Consult performed (Yes or No): Yes. Specimen: Left axillary sentinel node. Number and size: One node, 1.5 cm. 3 of 4 FINAL for Hirata, Merrissa M ([id number]) Gross(continued) Cut Surface(s): Soft, fatty, non blue. Block Summary: The node is bisected, touch  preparations are made on one slide and is submitted in one block labeled SLN for frozen section. (SW:gt, 12/20/11) 5. Specimen: Left simple mastectomy, received fresh. The specimen was placed in formalin at 2:50 p.m. on 12/19/11. Specimen integrity (intact/disrupted): Intact. Weight: 1185 grams. Size: 28 x 24.5 x 7 cm. Skin: There is an attached 16 x 7.5 cm ellipse of skin with a central, unremarkable nipple. There is a suture attached at the superior edge. Tumor/cavity: In the mid central to mid lateral breast there is an ill defined area of palpable nodular  fibrous tissue without a discrete mass identified. This area measures approximately 7 x 6 x 3 cm and extends to within 1.5 cm of the deep margin. Within this area there is a 1.5 cm cavitary defect, consistent with a previous biopsy site. There is a silver metallic biopsy clip present at the defect. Uninvolved parenchyma: The remainder of the breast tissue consists of fat and dense white fibrous tissue. Prognostic indicators: Obtain from paraffin blocks if needed. Lymph nodes: There are no lymph nodes identified. Block summary: 13 blocks submitted. A = deep margin at nodular area. B - I = sections of nodular area, sequentially labeled from lateral through medial, margins not included. J = lower medial. K = upper medial. L = lower lateral. M = upper lateral. (GP:eps 12/20/11)  RADIOGRAPHIC STUDIES:  Nm Sentinel Node Inj-no Rpt (breast)  12/19/2011  CLINICAL DATA: breast cancer   Sulfur colloid was injected intradermally by the nuclear medicine  technologist for breast cancer sentinel node localization.      ASSESSMENT: 51 year old female with  #1 stage II invasive ductal carcinoma of the left breast with ductal carcinoma in situ. Patient is status post mastectomy that showed 2 foci of invasive cancer measuring 0.8 0.6 cm. The tumor is ER positive PR positive HER-2/neu amplified at 3.27. Patient is now seen in medical oncology for  discussion of adjuvant treatment. As mentioned above her case was discussed at the multidisciplinary breast conference. Patient is noted to have one of 4 lymph nodes positive for metastatic disease. This was only axillary sentinel node biopsy. A discussion of whether patient under should undergo a full axillary lymph node dissection did take place but at the end of our extensive discussion it was recommended the patient will proceed with radiation and DOS she would feel the criteria of seed 11. Therefore is excellent lymph node dissection is not being done. Instead the patient will proceed with adjuvant chemotherapy and Herceptin. Her chemotherapy will consist of Taxotere carboplatinum given every 21 days. For the duration of her chemotherapy patient will get Herceptin on a weekly basis. Once she completes the chemotherapy she then will get Herceptin every 3 weeks. After completion of chemotherapy patient will proceed with radiation therapy with concurrent Herceptin. After completion of radiation she will start Herceptin and antiestrogen therapy consisting of either tamoxifen or an aromatase inhibitor.  #2 patient does have history of multiple sclerosis and fatigue related to that. Certainly she will get fatigued from her chemotherapy as well we will keep her neurologist very much in the [place] to help manage her symptoms.  #3 the hydration and nausea with associated weakness  #4 inflammation of hemorrhoids without any bleeding   PLAN:   #1 We will proceed with Herceptin, IVF, zofran, ativan, today  #2. We will give you IVF on 02/04/12 with zofran  #3 patient and I discussed symptomatic management of her rectal discomfort. She is to use sitz baths tucks medicated pads as well as Preparation H externally.   #4Patient will return in one week's time for followup. She will also receive IV fluids and anti-emetics if needed. She is also recommended to call us with any problems in the interim.  All questions  were answered. The patient knows to call the clinic with any problems, questions or concerns. We can certainly see the patient much sooner if necessary.  I spent 25 minutes counseling the patient face to face. The total time spent in the appointment was 30 minutes.    Drue Second, MD  Medical/Oncology [facility] [phone] (beeper) [phone] (Office)  02/03/2012, 12:10 PM

## 2012-02-04 ENCOUNTER — Ambulatory Visit (HOSPITAL_BASED_OUTPATIENT_CLINIC_OR_DEPARTMENT_OTHER): Payer: BC Managed Care – PPO

## 2012-02-04 VITALS — BP 122/76 | HR 76 | Resp 16

## 2012-02-04 DIAGNOSIS — E860 Dehydration: Secondary | ICD-10-CM

## 2012-02-04 MED ORDER — SODIUM CHLORIDE 0.9 % IV SOLN
Freq: Once | INTRAVENOUS | Status: AC
  Administered 2012-02-04: 08:00:00 via INTRAVENOUS

## 2012-02-04 MED ORDER — ONDANSETRON 8 MG/50ML IVPB (CHCC)
8.0000 mg | Freq: Once | INTRAVENOUS | Status: AC
  Administered 2012-02-04: 8 mg via INTRAVENOUS

## 2012-02-06 ENCOUNTER — Encounter (INDEPENDENT_AMBULATORY_CARE_PROVIDER_SITE_OTHER): Payer: Self-pay | Admitting: Surgery

## 2012-02-06 ENCOUNTER — Ambulatory Visit (INDEPENDENT_AMBULATORY_CARE_PROVIDER_SITE_OTHER): Payer: BC Managed Care – PPO | Admitting: Surgery

## 2012-02-06 VITALS — BP 150/82 | HR 79 | Temp 97.4°F | Ht 66.0 in | Wt 201.8 lb

## 2012-02-06 DIAGNOSIS — Z09 Encounter for follow-up examination after completed treatment for conditions other than malignant neoplasm: Secondary | ICD-10-CM

## 2012-02-06 DIAGNOSIS — IMO0002 Reserved for concepts with insufficient information to code with codable children: Secondary | ICD-10-CM

## 2012-02-06 MED ORDER — CEPHALEXIN 500 MG PO CAPS: 500.0000 mg | ORAL_CAPSULE | Freq: Three times a day (TID) | ORAL | Status: AC

## 2012-02-06 NOTE — Patient Instructions (Addendum)
Empty the drain twice daily or more if needed. If it stops draining call the office so we can see if it may be removed. See me in two weeks regardless Start antibiotic - Keflex 500 mg one three times daily

## 2012-02-06 NOTE — Progress Notes (Signed)
Still accumulating fluid. Wound healed and no infection  Placed a seroma cath drain today.  Prepped with Betadine, 1%xylo 5cc, sutured in with 3-0 Nylon  Will see in two weeks, sooner if drain slows. Will put on Keflex 500 TID as I am concerned about the increased risk for infection with drain and chemo

## 2012-02-07 ENCOUNTER — Encounter: Payer: Self-pay | Admitting: Oncology

## 2012-02-07 NOTE — Progress Notes (Signed)
Faxed [employer] form to [phone].

## 2012-02-09 ENCOUNTER — Other Ambulatory Visit: Payer: Self-pay | Admitting: Certified Registered Nurse Anesthetist

## 2012-02-09 NOTE — Progress Notes (Signed)
No note

## 2012-02-10 ENCOUNTER — Ambulatory Visit: Payer: BC Managed Care – PPO

## 2012-02-10 ENCOUNTER — Other Ambulatory Visit: Payer: Self-pay | Admitting: Medical Oncology

## 2012-02-10 ENCOUNTER — Other Ambulatory Visit (HOSPITAL_BASED_OUTPATIENT_CLINIC_OR_DEPARTMENT_OTHER): Payer: BC Managed Care – PPO

## 2012-02-10 ENCOUNTER — Telehealth: Payer: Self-pay | Admitting: *Deleted

## 2012-02-10 ENCOUNTER — Other Ambulatory Visit: Payer: Self-pay | Admitting: *Deleted

## 2012-02-10 ENCOUNTER — Encounter: Payer: Self-pay | Admitting: Oncology

## 2012-02-10 ENCOUNTER — Ambulatory Visit (HOSPITAL_BASED_OUTPATIENT_CLINIC_OR_DEPARTMENT_OTHER): Payer: BC Managed Care – PPO | Admitting: Oncology

## 2012-02-10 ENCOUNTER — Ambulatory Visit (HOSPITAL_BASED_OUTPATIENT_CLINIC_OR_DEPARTMENT_OTHER): Payer: BC Managed Care – PPO

## 2012-02-10 VITALS — BP 149/79 | HR 75 | Temp 98.6°F | Resp 20 | Ht 66.0 in | Wt 199.4 lb

## 2012-02-10 DIAGNOSIS — Z5112 Encounter for antineoplastic immunotherapy: Secondary | ICD-10-CM

## 2012-02-10 DIAGNOSIS — C50419 Malignant neoplasm of upper-outer quadrant of unspecified female breast: Secondary | ICD-10-CM

## 2012-02-10 DIAGNOSIS — R112 Nausea with vomiting, unspecified: Secondary | ICD-10-CM

## 2012-02-10 LAB — CBC WITH DIFFERENTIAL/PLATELET
BASO%: 0.6 % (ref 0.0–2.0)
Basophils Absolute: 0 10*3/uL (ref 0.0–0.1)
EOS%: 1 % (ref 0.0–7.0)
Eosinophils Absolute: 0.1 10*3/uL (ref 0.0–0.5)
HCT: 32.5 % — ABNORMAL LOW (ref 34.8–46.6)
HGB: 10.7 g/dL — ABNORMAL LOW (ref 11.6–15.9)
LYMPH%: 24.3 % (ref 14.0–49.7)
MCH: 25.8 pg (ref 25.1–34.0)
MCHC: 32.9 g/dL (ref 31.5–36.0)
MCV: 78.5 fL — ABNORMAL LOW (ref 79.5–101.0)
MONO#: 0.3 10*3/uL (ref 0.1–0.9)
MONO%: 4.3 % (ref 0.0–14.0)
NEUT#: 5 10*3/uL (ref 1.5–6.5)
NEUT%: 69.8 % (ref 38.4–76.8)
Platelets: 111 10*3/uL — ABNORMAL LOW (ref 145–400)
RBC: 4.14 10*6/uL (ref 3.70–5.45)
RDW: 14.8 % — ABNORMAL HIGH (ref 11.2–14.5)
WBC: 7.2 10*3/uL (ref 3.9–10.3)
lymph#: 1.7 10*3/uL (ref 0.9–3.3)
nRBC: 0 % (ref 0–0)

## 2012-02-10 LAB — COMPREHENSIVE METABOLIC PANEL
ALT: 39 U/L — ABNORMAL HIGH (ref 0–35)
AST: 29 U/L (ref 0–37)
Albumin: 4 g/dL (ref 3.5–5.2)
Alkaline Phosphatase: 194 U/L — ABNORMAL HIGH (ref 39–117)
BUN: 14 mg/dL (ref 6–23)
CO2: 27 mEq/L (ref 19–32)
Calcium: 9 mg/dL (ref 8.4–10.5)
Chloride: 101 mEq/L (ref 96–112)
Creatinine, Ser: 0.95 mg/dL (ref 0.50–1.10)
Glucose, Bld: 106 mg/dL — ABNORMAL HIGH (ref 70–99)
Potassium: 3.8 mEq/L (ref 3.5–5.3)
Sodium: 138 mEq/L (ref 135–145)
Total Bilirubin: 0.4 mg/dL (ref 0.3–1.2)
Total Protein: 6.6 g/dL (ref 6.0–8.3)

## 2012-02-10 MED ORDER — ONDANSETRON 8 MG/50ML IVPB (CHCC)
8.0000 mg | Freq: Once | INTRAVENOUS | Status: AC
  Administered 2012-02-10: 8 mg via INTRAVENOUS

## 2012-02-10 MED ORDER — ACETAMINOPHEN 325 MG PO TABS
650.0000 mg | ORAL_TABLET | Freq: Once | ORAL | Status: AC
  Administered 2012-02-10: 650 mg via ORAL

## 2012-02-10 MED ORDER — TRASTUZUMAB CHEMO INJECTION 440 MG
2.0000 mg/kg | Freq: Once | INTRAVENOUS | Status: AC
  Administered 2012-02-10: 189 mg via INTRAVENOUS
  Filled 2012-02-10: qty 9

## 2012-02-10 MED ORDER — LORAZEPAM 2 MG/ML IJ SOLN
0.5000 mg | Freq: Once | INTRAMUSCULAR | Status: AC
  Administered 2012-02-10: 0.5 mg via INTRAVENOUS

## 2012-02-10 MED ORDER — SODIUM CHLORIDE 0.9 % IV SOLN
Freq: Once | INTRAVENOUS | Status: AC
  Administered 2012-02-10: 20 mL via INTRAVENOUS

## 2012-02-10 MED ORDER — DIPHENHYDRAMINE HCL 25 MG PO CAPS
50.0000 mg | ORAL_CAPSULE | Freq: Once | ORAL | Status: AC
  Administered 2012-02-10: 50 mg via ORAL

## 2012-02-10 NOTE — Telephone Encounter (Signed)
Faxed over the orders to the physical therapy on 02-10-2012

## 2012-02-10 NOTE — Progress Notes (Signed)
OFFICE PROGRESS NOTE  CC  Julie Ross [address] [place] [address] Dr. Cyndia Bent Dr. Richardean Chimera Dr. Mardella Layman Dr. Gustavus Messing ([facility])  DIAGNOSIS: 51 year old female who originally was seen in the multidisciplinary breast clinic for discussion of new diagnosis of left breast cancer. She is now status post left mastectomy with the final pathology revealing an invasive ductal carcinoma that is ER positive PR positive HER-2/neu positive with Ki-67 11%.  PRIOR THERAPY:  #1 patient was originally seen in the multidisciplinary breast clinic on 11/30/2011 for what at that time looked like a ductal carcinoma in situ of the left breast. Due to the extent of disease she was recommended a mastectomy with axillary lymph node sampling.  #2 12/19/2011 patient had a left mastectomy with sentinel lymph node biopsy. Her final pathology revealed 2 foci of invasive ductal carcinoma measuring 0.8 and 0.6 cm. The tumor was low grade ER positive PR positive. Patient's tumor was found to be HER-2/neu positive with HER-2 ratio of 3.27. Ki-67 was 11%. Patient had sentinel lymph node  Biopsies performed one of 4 lymph nodes were positive for metastatic disease. Patient's final pathologic staging is T1 N1 M0. Stage II  #3 patient is receiving adjuvant chemotherapy consisting of Taxotere carboplatinum administered every 21 days with weekly Herceptin. A total of 4 cycles of Taxotere carboplatinum are planned.  CURRENT THERAPY: Patient is here for Herceptin only today  INTERVAL HISTORY: Julie Ross 51 y.o. female returns for Followup visit. Patient has several complaints including nausea and vomiting she was not able to take the anti-emetics as prescribed and we explained to her how to take them again. She is also complaining of itching and pain in the rectal area although there is no findings of any type of infections or irritation. She does have a  history of hemorrhoids and she does like feel like inflammation of the hemorrhoids. She is nauseous today she is very weak and tired. She does not have any diarrhea or constipation no tingling or numbness she does have weakness. She has not been drinking as well as she should she seems to be dehydrated as well today. Her by mouth intake is suboptimal at this time. She has not had any fevers or chills or night sweats. Remainder of the 10 point review of systems is negative.  MEDICAL HISTORY: Past Medical History  Diagnosis Date  . Hypertension   . Hypothyroidism   . Arthritis   . Neuromuscular disorder     MS TX WITH CYMBALTA   . Multiple sclerosis   . H/O colonoscopy   . H/O bone density study 03/2011  . Wears glasses   . Heart burn   . Depression   . Sleep apnea     MILD SLEEP APNEA, NO MACHINE  6 YRS AGO HPT [facility]   . Breast cancer     lt breast    ALLERGIES:   has no known allergies.  MEDICATIONS:  Current Outpatient Prescriptions  Medication Sig Dispense Refill  . baclofen (LIORESAL) 10 MG tablet Take 10 mg by mouth 3 (three) times daily.      Marland Kitchen CALCIUM-MAGNESIUM PO Take 1 tablet by mouth daily.      . cephALEXin (KEFLEX) 500 MG capsule Take 1 capsule (500 mg total) by mouth 3 (three) times daily.  30 capsule  0  . clonazePAM (KLONOPIN) 0.5 MG tablet Take 0.5 mg by mouth at bedtime as needed. For sleep      .  dexamethasone (DECADRON) 4 MG tablet Take 2 tablets two times a day the day before Taxotere. Then take 2 tabs two times a day starting the day after chemo for 3 days.  30 tablet  1  . DULoxetine (CYMBALTA) 60 MG capsule Take 60 mg by mouth daily.      Marland Kitchen esomeprazole (NEXIUM) 40 MG capsule Take 40 mg by mouth daily before breakfast.      . gabapentin (NEURONTIN) 300 MG capsule Take 600 mg by mouth 3 (three) times daily.       Marland Kitchen HYDROcodone-acetaminophen (NORCO) 10-325 MG per tablet       . lidocaine-prilocaine (EMLA) cream Apply topically as needed.  30 g  0  .  LORazepam (ATIVAN) 0.5 MG tablet Take 1 tablet (0.5 mg total) by mouth every 6 (six) hours as needed (Nausea or vomiting).  30 tablet  0  . losartan-hydrochlorothiazide (HYZAAR) 50-12.5 MG per tablet Take 1 tablet by mouth daily.      . Multiple Vitamins-Minerals (MULTIVITAMINS THER. W/MINERALS) TABS Take 1 tablet by mouth daily.      . naproxen sodium (ANAPROX) 550 MG tablet Take 550 mg by mouth as needed.      . ondansetron (ZOFRAN) 8 MG tablet Take 1 tablet two times a day starting the day after chemo for 3 days. Then take 1 tab two times a day as needed for nausea or vomiting.  30 tablet  1  . prochlorperazine (COMPAZINE) 10 MG tablet Take 1 tablet (10 mg total) by mouth every 6 (six) hours as needed (Nausea or vomiting).  30 tablet  1  . prochlorperazine (COMPAZINE) 25 MG suppository Place 1 suppository (25 mg total) rectally every 12 (twelve) hours as needed for nausea.  12 suppository  3  . UNABLE TO FIND Cranial Prothesis  1 each  0    SURGICAL HISTORY:  Past Surgical History  Procedure Date  . Tonsillectomy   . Tumor removal     FROM PELVIS AGE 66  . Cesarean section     X2   . No past surgeries     BLADDER SLING  4-5 YRS AGO   . Knee arthroscopy     LT KNEE 04/2011  . Total knee arthroplasty 08/15/2011    Procedure: TOTAL KNEE ARTHROPLASTY; lft Surgeon: Harvie Junior, MD;  Location: [facility] OR;  Service: Orthopedics;  Laterality: Left;  RIGHT KNEE CORTIZONE INJECTION  . Mohs surgery   . Knee arthroscopy 82    rt  . Joint replacement Feb 2013  . Mastectomy w/ sentinel node biopsy 12/19/2011    Procedure: MASTECTOMY WITH SENTINEL LYMPH NODE BIOPSY;  Surgeon: Currie Paris, MD;  Location: [facility] OR;  Service: General;  Laterality: Left;  left breast and left axilla   . Portacath placement 01/16/2012    Procedure: INSERTION PORT-A-CATH;  Surgeon: Currie Paris, MD;  Location: [facility];  Service: General;  Laterality: Right;  Porta Cath Placement     REVIEW OF  SYSTEMS:  Pertinent items are noted in HPI.   PHYSICAL EXAMINATION: General appearance: alert, cooperative and appears stated age Neck: no adenopathy, no carotid bruit, no JVD, supple, symmetrical, trachea midline and thyroid not enlarged, symmetric, no tenderness/mass/nodules Lymph nodes: Cervical, supraclavicular, and axillary nodes normal. Resp: clear to auscultation bilaterally and normal percussion bilaterally Back: symmetric, no curvature. ROM normal. No CVA tenderness. Cardio: regular rate and rhythm, S1, S2 normal, no murmur, click, rub or gallop GI: soft, non-tender; bowel sounds normal;  no masses,  no organomegaly Extremities: extremities normal, atraumatic, no cyanosis or edema Neurologic: Grossly normal  ECOG PERFORMANCE STATUS: 1 - Symptomatic but completely ambulatory  Blood pressure 149/79, pulse 75, temperature 98.6 F (37 C), temperature source Oral, resp. rate 20, height 5\' 6"  (1.676 m), weight 199 lb 6.4 oz (90.447 kg).  LABORATORY DATA: Lab Results  Component Value Date   WBC 7.2 02/10/2012   HGB 10.7* 02/10/2012   HCT 32.5* 02/10/2012   MCV 78.5* 02/10/2012   PLT 111* 02/10/2012      Chemistry      Component Value Date/Time   NA 139 02/03/2012 1126   K 3.9 02/03/2012 1126   CL 99 02/03/2012 1126   CO2 29 02/03/2012 1126   BUN 14 02/03/2012 1126   CREATININE 0.93 02/03/2012 1126      Component Value Date/Time   CALCIUM 8.9 02/03/2012 1126   ALKPHOS 183* 02/03/2012 1126   AST 42* 02/03/2012 1126   ALT 57* 02/03/2012 1126   BILITOT 0.4 02/03/2012 1126     DOB: 11-05-1960 Age: 17 Gender: F Client Name [place] Collected Date: 12/19/2011 Received Date: 12/19/2011 Physician: Cyndia Bent Chart #: MRN # : [id number] Physician cc: Richardean Chimera, MD Sherian Rein, MD Race:W Visit #: [id number] Kaylyn Lim, RN REPORT OF SURGICAL PATHOLOGY ADDITIONAL INFORMATION: 5. PROGNOSTIC INDICATORS - ACIS Results IMMUNOHISTOCHEMICAL AND MORPHOMETRIC  ANALYSIS BY THE AUTOMATED CELLULAR IMAGING SYSTEM (ACIS) Estrogen Receptor (Negative, <1%): 100%, STRONG STAINING INTENSITY Progesterone Receptor (Negative, <1%): 100%, STRONG STAINING INTENSITY Proliferation Marker Ki67 by M IB-1 (Low<20%): 11% All controls stained appropriately Pecola Leisure MD Pathologist, Electronic Signature ( Signed 12/29/2011) 5. CHROMOGENIC IN-SITU HYBRIDIZATION Interpretation: HER2/NEU BY CISH - SHOWS AMPLIFICATION BY CISH ANALYSIS. THE RATIO OF HER2: CEP 17 SIGNALS WAS 3.27 Reference range: Ratio: HER2:CEP17 < 1.8 gene amplification not observed Ratio: HER2:CEP 17 1.8-2.2 - equivocal result Ratio: HER2:CEP17 > 2.2 - gene amplification observed Pecola Leisure MD Pathologist, Electronic Signature ( Signed 12/27/2011) 1 of 4 FINAL for Balthazor, Claudett M ([id number]) FINAL DIAGNOSIS Diagnosis 1. Lymph node, sentinel, biopsy, Left axillary - ONE BENIGN LYMPH NODE WITH NO TUMOR SEEN (0/1). 2. Lymph node, sentinel, biopsy, Left axilla - ONE LYMPH NODE POSITIVE FOR METASTATIC DUCTAL CARCINOMA (1/1). - SEE COMMENT. 3. Lymph node, sentinel, biopsy, Left axilla - ONE BENIGN LYMPH NODE WITH NO TUMOR SEEN (0/1). 4. Lymph node, sentinel, biopsy, Left axilla - ONE BENIGN LYMPH NODE WITH NO TUMOR SEEN (0/1). 5. Breast, simple mastectomy, Left - INVASIVE GRADE I DUCTAL CARCINOMA, TWO FOCI MEASURING 0.8 CM AND 0.6 CM ARISING IN A BACKGROUND OF EXTENSIVE INTERMEDIATE GRADE DUCTAL CARCINOMA IN SITU WITH NECROSIS. - MARGINS ARE NEGATIVE. - SEE ONCOLOGY TEMPLATE. Microscopic Comment 2. On re-review of the frozen section slides, no metastatic carcinoma is identified. The carcinoma present is on permanent sections only. 5. BREAST, INVASIVE TUMOR, WITH LYMPH NODE SAMPLING Specimen, including laterality: Left breast with sentinel lymph node. Procedure: Left simple mastectomy with sentinel lymph node biopsy. Grade: I (Both foci of tumors show similar morphology). Tubule  formation: 2. Nuclear pleomorphism: 2. Mitotic: 1. Tumor size (glass slide measurement): Two foci measuring 0.8 cm and 0.6 cm. Margins: Invasive, distance to closest margin: At least 1.5 cm. In-situ, distance to closest margin: At least 1.5 cm. Lymphovascular invasion: No definite lymph/vascular invasion is not identified however a sentinel lymph node is positive (see below). Ductal carcinoma in situ: Yes. Grade: Intermediate grade. Extensive intraductal component: Yes. Lobular neoplasia: Not identified. Tumor  focality: Two foci. Treatment effect: Not applicable. Extent of tumor: Tumor confined to breast parenchyma. Lymph nodes: # examined: 4. Lymph nodes with metastasis: 1. Macrometastasis: (> 2.0 mm): 1. Extracapsular extension: Not identified. Breast prognostic profile: A breast prognostic profile will be performed (see below comment). Additional breast findings: Fibrocystic changes with usual ductal hyperplasia. TNM: mpT1b, pN1a, MX. Comment: The palpable nodular area seen grossly is comprised predominantly of intermediate grade ductal carcinoma in situ with only two foci of invasive ductal carcinoma. Both invasive foci are morphologically similar. As such a breast prognostic profile will only be performed on the larger tumor. A breast prognostic profile can be performed upon the smaller tumor per request. (RH:kh 12-21-11) 2 of 4 FINAL for Yeargan, Gyselle M ([id number]) Microscopic Comment(continued) Zandra Abts MD Pathologist, Electronic Signature (Case signed 12/21/2011) Intraoperative Diagnosis 1. RECEIVED FRESH IS TISSUE THAT CONTAINS A LYMPH NODE WHICH IS SAMPLED FOR RIOC. LEFT AXILLARY SENTINEL LYMPH NODE, FROZEN SECTION AND TOUCH PREPARATION- ONE LYMPH NODE WITH NO METASTATIC CARCINOMA IDENTIFIED. (RAH) 2. RECEIVED FRESH IS TISSUE THAT CONTAINS A LYMPH NODE WHICH IS SAMPLED FOR RIOC. LEFT AXILLARY SENTINEL LYMPH NODE, FROZEN SECTION AND TOUCH PREPARATION- ONE LYMPH  NODE WITH NO METASTATIC CARCINOMA IDENTIFIED. (RAH) 3. RECEIVED FRESH IS TISSUE THAT CONTAINS A LYMPH NODE WHICH IS SAMPLED FOR RIOC. LEFT AXILLARY SENTINEL LYMPH NODE, FROZEN SECTION AND TOUCH PREPARATION- ONE LYMPH NODE WITH NO METASTATIC CARCINOMA IDENTIFIED. (RAH) 4. RECEIVED FRESH IS TISSUE THAT CONTAINS A LYMPH NODE WHICH IS SAMPLED FOR RIOC. LEFT AXILLARY SENTINEL LYMPH NODE, FROZEN SECTION AND TOUCH PREPARATION- ONE LYMPH NODE WITH NO METASTATIC CARCINOMA IDENTIFIED. (RAH) Specimen Gross and Clinical Information Specimen(s) Obtained: 1. Lymph node, sentinel, biopsy, Left axillary 2. Lymph node, sentinel, biopsy, Left axilla 3. Lymph node, sentinel, biopsy, Left axilla 4. Lymph node, sentinel, biopsy, Left axilla 5. Breast, simple mastectomy, Left Specimen Clinical Information 1. Left breast cancer (tl) Gross 1. Rapid Intraoperative Consult performed (Yes or No): Yes. Specimen: Left axillary sentinel node. Number and size: One node, 2.2 cm Cut Surface(s): Soft, fatty, focally blue. Block Summary: The node is sectioned, touch preparations are made on one slide, and the tissue is submitted in one block labeled SLN for frozen section. 2. Rapid Intraoperative Consult performed (Yes or No): Yes. Specimen: Left axillary sentinel node. Number and size: One node, 2.1 cm. Cut Surface(s): Soft, fatty, focally blue. Block Summary: The node is bisected, touch preparations are made on one slide, and is submitted in one block labeled SLN for frozen section. 3. Rapid Intraoperative Consult performed (Yes or No): Yes. Specimen: Left axillary sentinel node. Number and size: One node, 0.7 cm Cut Surface(s): Soft, fatty, focally blue. Block Summary: The node is bisected, touch preparations are made on one slide, and is submitted in one block labeled SLN for frozen section. 4. Rapid Intraoperative Consult performed (Yes or No): Yes. Specimen: Left axillary sentinel node. Number and size: One  node, 1.5 cm. 3 of 4 FINAL for Spaziani, Marki M ([id number]) Gross(continued) Cut Surface(s): Soft, fatty, non blue. Block Summary: The node is bisected, touch preparations are made on one slide and is submitted in one block labeled SLN for frozen section. (SW:gt, 12/20/11) 5. Specimen: Left simple mastectomy, received fresh. The specimen was placed in formalin at 2:50 p.m. on 12/19/11. Specimen integrity (intact/disrupted): Intact. Weight: 1185 grams. Size: 28 x 24.5 x 7 cm. Skin: There is an attached 16 x 7.5 cm ellipse of skin with a central, unremarkable nipple. There is a suture  attached at the superior edge. Tumor/cavity: In the mid central to mid lateral breast there is an ill defined area of palpable nodular fibrous tissue without a discrete mass identified. This area measures approximately 7 x 6 x 3 cm and extends to within 1.5 cm of the deep margin. Within this area there is a 1.5 cm cavitary defect, consistent with a previous biopsy site. There is a silver metallic biopsy clip present at the defect. Uninvolved parenchyma: The remainder of the breast tissue consists of fat and dense white fibrous tissue. Prognostic indicators: Obtain from paraffin blocks if needed. Lymph nodes: There are no lymph nodes identified. Block summary: 13 blocks submitted. A = deep margin at nodular area. B - I = sections of nodular area, sequentially labeled from lateral through medial, margins not included. J = lower medial. K = upper medial. L = lower lateral. M = upper lateral. (GP:eps 12/20/11)  RADIOGRAPHIC STUDIES:  Nm Sentinel Node Inj-no Rpt (breast)  12/19/2011  CLINICAL DATA: breast cancer   Sulfur colloid was injected intradermally by the nuclear medicine  technologist for breast cancer sentinel node localization.      ASSESSMENT: 51 year old female with  #1 stage II invasive ductal carcinoma of the left breast with ductal carcinoma in situ. Patient is status post mastectomy that showed 2  foci of invasive cancer measuring 0.8 0.6 cm. The tumor is ER positive PR positive HER-2/neu amplified at 3.27. Patient is now seen in medical oncology for discussion of adjuvant treatment. As mentioned above her case was discussed at the multidisciplinary breast conference. Patient is noted to have one of 4 lymph nodes positive for metastatic disease. This was only axillary sentinel node biopsy. A discussion of whether patient under should undergo a full axillary lymph node dissection did take place but at the end of our extensive discussion it was recommended the patient will proceed with radiation and DOS she would feel the criteria of seed 11. Therefore is excellent lymph node dissection is not being done. Instead the patient will proceed with adjuvant chemotherapy and Herceptin. Her chemotherapy will consist of Taxotere carboplatinum given every 21 days. For the duration of her chemotherapy patient will get Herceptin on a weekly basis. Once she completes the chemotherapy she then will get Herceptin every 3 weeks. After completion of chemotherapy patient will proceed with radiation therapy with concurrent Herceptin. After completion of radiation she will start Herceptin and antiestrogen therapy consisting of either tamoxifen or an aromatase inhibitor.  #2 patient does have history of multiple sclerosis and fatigue related to that. Certainly she will get fatigued from her chemotherapy as well we will keep her neurologist very much in the [place] to help manage her symptoms.  #3 the hydration and nausea with associated weakness  #4 inflammation of hemorrhoids without any bleeding   PLAN:   #1 We will proceed with Herceptin, IVF, zofran, ativan, today  #2. We will give you IVF and zofran on 02/11/12    All questions were answered. The patient knows to call the clinic with any problems, questions or concerns. We can certainly see the patient much sooner if necessary.  I spent 25 minutes counseling the  patient face to face. The total time spent in the appointment was 30 minutes.    Drue Second, MD Medical/Oncology [facility] [phone] (beeper) [phone] (Office)  02/10/2012, 9:47 AM

## 2012-02-10 NOTE — Patient Instructions (Addendum)
Proceed with herceptin today with IVF and zofran  Return on 8/24 for IVF and zofran  Use aquaphor for dryness of the skin  Refer to lymphedema clinic

## 2012-02-11 ENCOUNTER — Ambulatory Visit: Payer: BC Managed Care – PPO

## 2012-02-11 ENCOUNTER — Ambulatory Visit (HOSPITAL_BASED_OUTPATIENT_CLINIC_OR_DEPARTMENT_OTHER): Payer: BC Managed Care – PPO

## 2012-02-11 DIAGNOSIS — C50419 Malignant neoplasm of upper-outer quadrant of unspecified female breast: Secondary | ICD-10-CM

## 2012-02-11 DIAGNOSIS — E860 Dehydration: Secondary | ICD-10-CM

## 2012-02-11 DIAGNOSIS — R110 Nausea: Secondary | ICD-10-CM

## 2012-02-11 MED ORDER — SODIUM CHLORIDE 0.9 % IV SOLN
INTRAVENOUS | Status: DC
  Administered 2012-02-11: 09:00:00 via INTRAVENOUS

## 2012-02-11 MED ORDER — ONDANSETRON 8 MG/50ML IVPB (CHCC)
8.0000 mg | Freq: Once | INTRAVENOUS | Status: AC
  Administered 2012-02-11: 8 mg via INTRAVENOUS

## 2012-02-11 NOTE — Patient Instructions (Signed)
Dehydration, Adult Dehydration is when you lose more fluids from the body than you take in. Vital organs like the kidneys, brain, and heart cannot function without a proper amount of fluids and salt. Any loss of fluids from the body can cause dehydration.  CAUSES   Vomiting.   Diarrhea.   Excessive sweating.   Excessive urine output.   Fever.  SYMPTOMS  Mild dehydration  Thirst.   Dry lips.   Slightly dry mouth.  Moderate dehydration  Very dry mouth.   Sunken eyes.   Skin does not bounce back quickly when lightly pinched and released.   Dark urine and decreased urine production.   Decreased tear production.   Headache.  Severe dehydration  Very dry mouth.   Extreme thirst.   Rapid, weak pulse (more than 100 beats per minute at rest).   Cold hands and feet.   Not able to sweat in spite of heat and temperature.   Rapid breathing.   Blue lips.   Confusion and lethargy.   Difficulty being awakened.   Minimal urine production.   No tears.  DIAGNOSIS  Your caregiver will diagnose dehydration based on your symptoms and your exam. Blood and urine tests will help confirm the diagnosis. The diagnostic evaluation should also identify the cause of dehydration. TREATMENT  Treatment of mild or moderate dehydration can often be done at home by increasing the amount of fluids that you drink. It is best to drink small amounts of fluid more often. Drinking too much at one time can make vomiting worse. Refer to the home care instructions below. Severe dehydration needs to be treated at the hospital where you will probably be given intravenous (IV) fluids that contain water and electrolytes. HOME CARE INSTRUCTIONS   Ask your caregiver about specific rehydration instructions.   Drink enough fluids to keep your urine clear or pale yellow.   Drink small amounts frequently if you have nausea and vomiting.   Eat as you normally do.   Avoid:   Foods or drinks high in  sugar.   Carbonated drinks.   Juice.   Extremely hot or cold fluids.   Drinks with caffeine.   Fatty, greasy foods.   Alcohol.   Tobacco.   Overeating.   Gelatin desserts.   Wash your hands well to avoid spreading bacteria and viruses.   Only take over-the-counter or prescription medicines for pain, discomfort, or fever as directed by your caregiver.   Ask your caregiver if you should continue all prescribed and over-the-counter medicines.   Keep all follow-up appointments with your caregiver.  SEEK MEDICAL CARE IF:  You have abdominal pain and it increases or stays in one area (localizes).   You have a rash, stiff neck, or severe headache.   You are irritable, sleepy, or difficult to awaken.   You are weak, dizzy, or extremely thirsty.  SEEK IMMEDIATE MEDICAL CARE IF:   You are unable to keep fluids down or you get worse despite treatment.   You have frequent episodes of vomiting or diarrhea.   You have blood or green matter (bile) in your vomit.   You have blood in your stool or your stool looks black and tarry.   You have not urinated in 6 to 8 hours, or you have only urinated a small amount of very dark urine.   You have a fever.   You faint.  MAKE SURE YOU:   Understand these instructions.   Will watch your condition.     Will get help right away if you are not doing well or get worse.  Document Released: 06/06/2005 Document Revised: 05/26/2011 Document Reviewed: 01/24/2011 [facility] Patient Information 2012 [place], [place]. You received 1 Liter of Normal Saline today.  Call if you have any nausea, vomiting, or diarrhea.  [phone].

## 2012-02-16 ENCOUNTER — Ambulatory Visit (HOSPITAL_BASED_OUTPATIENT_CLINIC_OR_DEPARTMENT_OTHER): Payer: BC Managed Care – PPO | Admitting: Oncology

## 2012-02-16 ENCOUNTER — Other Ambulatory Visit (HOSPITAL_BASED_OUTPATIENT_CLINIC_OR_DEPARTMENT_OTHER): Payer: BC Managed Care – PPO | Admitting: Lab

## 2012-02-16 ENCOUNTER — Telehealth: Payer: Self-pay | Admitting: *Deleted

## 2012-02-16 ENCOUNTER — Encounter: Payer: Self-pay | Admitting: Oncology

## 2012-02-16 VITALS — BP 145/83 | HR 82 | Temp 98.5°F | Resp 20 | Ht 66.0 in | Wt 202.6 lb

## 2012-02-16 DIAGNOSIS — C50419 Malignant neoplasm of upper-outer quadrant of unspecified female breast: Secondary | ICD-10-CM

## 2012-02-16 DIAGNOSIS — R110 Nausea: Secondary | ICD-10-CM

## 2012-02-16 DIAGNOSIS — C773 Secondary and unspecified malignant neoplasm of axilla and upper limb lymph nodes: Secondary | ICD-10-CM

## 2012-02-16 DIAGNOSIS — E860 Dehydration: Secondary | ICD-10-CM

## 2012-02-16 LAB — CBC WITH DIFFERENTIAL/PLATELET
BASO%: 0.5 % (ref 0.0–2.0)
Basophils Absolute: 0 10*3/uL (ref 0.0–0.1)
EOS%: 1.9 % (ref 0.0–7.0)
Eosinophils Absolute: 0.1 10*3/uL (ref 0.0–0.5)
HCT: 31.8 % — ABNORMAL LOW (ref 34.8–46.6)
HGB: 10.4 g/dL — ABNORMAL LOW (ref 11.6–15.9)
LYMPH%: 21.7 % (ref 14.0–49.7)
MCH: 25.9 pg (ref 25.1–34.0)
MCHC: 32.7 g/dL (ref 31.5–36.0)
MCV: 79.3 fL — ABNORMAL LOW (ref 79.5–101.0)
MONO#: 0.4 10*3/uL (ref 0.1–0.9)
MONO%: 6.4 % (ref 0.0–14.0)
NEUT#: 4.1 10*3/uL (ref 1.5–6.5)
NEUT%: 69.5 % (ref 38.4–76.8)
Platelets: 213 10*3/uL (ref 145–400)
RBC: 4.01 10*6/uL (ref 3.70–5.45)
RDW: 15.8 % — ABNORMAL HIGH (ref 11.2–14.5)
WBC: 5.9 10*3/uL (ref 3.9–10.3)
lymph#: 1.3 10*3/uL (ref 0.9–3.3)

## 2012-02-16 LAB — COMPREHENSIVE METABOLIC PANEL (CC13)
ALT: 39 U/L (ref 0–55)
AST: 32 U/L (ref 5–34)
Albumin: 3.6 g/dL (ref 3.5–5.0)
Alkaline Phosphatase: 204 U/L — ABNORMAL HIGH (ref 40–150)
BUN: 12 mg/dL (ref 7.0–26.0)
CO2: 26 mEq/L (ref 22–29)
Calcium: 9 mg/dL (ref 8.4–10.4)
Chloride: 102 mEq/L (ref 98–107)
Creatinine: 1 mg/dL (ref 0.6–1.1)
Glucose: 111 mg/dl — ABNORMAL HIGH (ref 70–99)
Potassium: 3.7 mEq/L (ref 3.5–5.1)
Sodium: 137 mEq/L (ref 136–145)
Total Bilirubin: 0.6 mg/dL (ref 0.20–1.20)
Total Protein: 6.3 g/dL — ABNORMAL LOW (ref 6.4–8.3)

## 2012-02-16 NOTE — Telephone Encounter (Signed)
Per POF and staff message I have scheduled appts. JMW

## 2012-02-16 NOTE — Progress Notes (Signed)
OFFICE PROGRESS NOTE  CC  Julie Ross [address] [place] [address] Dr. Cyndia Bent Dr. Richardean Chimera Dr. Mardella Layman Dr. Gustavus Messing ([facility])  DIAGNOSIS: 51 year old female who originally was seen in the multidisciplinary breast clinic for discussion of new diagnosis of left breast cancer. She is now status post left mastectomy with the final pathology revealing an invasive ductal carcinoma that is ER positive PR positive HER-2/neu positive with Ki-67 11%.  PRIOR THERAPY:  #1 patient was originally seen in the multidisciplinary breast clinic on 11/30/2011 for what at that time looked like a ductal carcinoma in situ of the left breast. Due to the extent of disease she was recommended a mastectomy with axillary lymph node sampling.  #2 12/19/2011 patient had a left mastectomy with sentinel lymph node biopsy. Her final pathology revealed 2 foci of invasive ductal carcinoma measuring 0.8 and 0.6 cm. The tumor was low grade ER positive PR positive. Patient's tumor was found to be HER-2/neu positive with HER-2 ratio of 3.27. Ki-67 was 11%. Patient had sentinel lymph node  Biopsies performed one of 4 lymph nodes were positive for metastatic disease. Patient's final pathologic staging is T1 N1 M0. Stage II  #3 patient is receiving adjuvant chemotherapy consisting of Taxotere carboplatinum administered every 21 days with weekly Herceptin. A total of 4 cycles of Taxotere carboplatinum are planned.  CURRENT THERAPY: Patient is here for [facility] on 8/30  INTERVAL HISTORY: Julie Ross 51 y.o. female returns for Followup visit. Overall she is significantly better since her last visit. She does complain of SOB on exertion relieved by rest. She experience headaches worse in the mornings but then do resolve. No N/V/D. No fevers or cheills no bleeding Remainder of the 10 point review of systems is negative. MEDICAL HISTORY: Past Medical History    Diagnosis Date  . Hypertension   . Hypothyroidism   . Arthritis   . Neuromuscular disorder     MS TX WITH CYMBALTA   . Multiple sclerosis   . H/O colonoscopy   . H/O bone density study 03/2011  . Wears glasses   . Heart burn   . Depression   . Sleep apnea     MILD SLEEP APNEA, NO MACHINE  6 YRS AGO HPT [facility]   . Breast cancer     lt breast    ALLERGIES:   has no known allergies.  MEDICATIONS:  Current Outpatient Prescriptions  Medication Sig Dispense Refill  . baclofen (LIORESAL) 10 MG tablet Take 10 mg by mouth 3 (three) times daily.      Marland Kitchen CALCIUM-MAGNESIUM PO Take 1 tablet by mouth daily.      . cephALEXin (KEFLEX) 500 MG capsule Take 1 capsule (500 mg total) by mouth 3 (three) times daily.  30 capsule  0  . clonazePAM (KLONOPIN) 0.5 MG tablet Take 0.5 mg by mouth at bedtime as needed. For sleep      . dexamethasone (DECADRON) 4 MG tablet Take 2 tablets two times a day the day before Taxotere. Then take 2 tabs two times a day starting the day after chemo for 3 days.  30 tablet  1  . DULoxetine (CYMBALTA) 60 MG capsule Take 60 mg by mouth daily.      Marland Kitchen esomeprazole (NEXIUM) 40 MG capsule Take 40 mg by mouth daily before breakfast.      . gabapentin (NEURONTIN) 300 MG capsule Take 600 mg by mouth 3 (three) times daily.       Marland Kitchen  HYDROcodone-acetaminophen (NORCO) 10-325 MG per tablet       . lidocaine-prilocaine (EMLA) cream Apply topically as needed.  30 g  0  . LORazepam (ATIVAN) 0.5 MG tablet Take 1 tablet (0.5 mg total) by mouth every 6 (six) hours as needed (Nausea or vomiting).  30 tablet  0  . losartan-hydrochlorothiazide (HYZAAR) 50-12.5 MG per tablet Take 1 tablet by mouth daily.      . Multiple Vitamins-Minerals (MULTIVITAMINS THER. W/MINERALS) TABS Take 1 tablet by mouth daily.      . naproxen sodium (ANAPROX) 550 MG tablet Take 550 mg by mouth as needed.      . ondansetron (ZOFRAN) 8 MG tablet Take 1 tablet two times a day starting the day after chemo for 3 days.  Then take 1 tab two times a day as needed for nausea or vomiting.  30 tablet  1  . prochlorperazine (COMPAZINE) 10 MG tablet Take 1 tablet (10 mg total) by mouth every 6 (six) hours as needed (Nausea or vomiting).  30 tablet  1  . prochlorperazine (COMPAZINE) 25 MG suppository Place 1 suppository (25 mg total) rectally every 12 (twelve) hours as needed for nausea.  12 suppository  3  . UNABLE TO FIND Cranial Prothesis  1 each  0    SURGICAL HISTORY:  Past Surgical History  Procedure Date  . Tonsillectomy   . Tumor removal     FROM PELVIS AGE 56  . Cesarean section     X2   . No past surgeries     BLADDER SLING  4-5 YRS AGO   . Knee arthroscopy     LT KNEE 04/2011  . Total knee arthroplasty 08/15/2011    Procedure: TOTAL KNEE ARTHROPLASTY; lft Surgeon: Harvie Junior, MD;  Location: [facility] OR;  Service: Orthopedics;  Laterality: Left;  RIGHT KNEE CORTIZONE INJECTION  . Mohs surgery   . Knee arthroscopy 82    rt  . Joint replacement Feb 2013  . Mastectomy w/ sentinel node biopsy 12/19/2011    Procedure: MASTECTOMY WITH SENTINEL LYMPH NODE BIOPSY;  Surgeon: Currie Paris, MD;  Location: [facility] OR;  Service: General;  Laterality: Left;  left breast and left axilla   . Portacath placement 01/16/2012    Procedure: INSERTION PORT-A-CATH;  Surgeon: Currie Paris, MD;  Location: [facility];  Service: General;  Laterality: Right;  Porta Cath Placement     REVIEW OF SYSTEMS:  Pertinent items are noted in HPI.   PHYSICAL EXAMINATION: General appearance: alert, cooperative and appears stated age Neck: no adenopathy, no carotid bruit, no JVD, supple, symmetrical, trachea midline and thyroid not enlarged, symmetric, no tenderness/mass/nodules Lymph nodes: Cervical, supraclavicular, and axillary nodes normal. Resp: clear to auscultation bilaterally and normal percussion bilaterally Back: symmetric, no curvature. ROM normal. No CVA tenderness. Cardio: regular rate and rhythm, S1, S2  normal, no murmur, click, rub or gallop GI: soft, non-tender; bowel sounds normal; no masses,  no organomegaly Extremities: extremities normal, atraumatic, no cyanosis or edema Neurologic: Grossly normal  ECOG PERFORMANCE STATUS: 1 - Symptomatic but completely ambulatory  Blood pressure 145/83, pulse 82, temperature 98.5 F (36.9 C), temperature source Oral, resp. rate 20, height 5\' 6"  (1.676 Ross), weight 202 lb 9.6 oz (91.899 kg).  LABORATORY DATA: Lab Results  Component Value Date   WBC 5.9 02/16/2012   HGB 10.4* 02/16/2012   HCT 31.8* 02/16/2012   MCV 79.3* 02/16/2012   PLT 213 02/16/2012      Chemistry  Component Value Date/Time   NA 137 02/16/2012 1117   NA 138 02/10/2012 0850   K 3.7 02/16/2012 1117   K 3.8 02/10/2012 0850   CL 102 02/16/2012 1117   CL 101 02/10/2012 0850   CO2 26 02/16/2012 1117   CO2 27 02/10/2012 0850   BUN 12.0 02/16/2012 1117   BUN 14 02/10/2012 0850   CREATININE 1.0 02/16/2012 1117   CREATININE 0.95 02/10/2012 0850      Component Value Date/Time   CALCIUM 9.0 02/16/2012 1117   CALCIUM 9.0 02/10/2012 0850   ALKPHOS 204* 02/16/2012 1117   ALKPHOS 194* 02/10/2012 0850   AST 32 02/16/2012 1117   AST 29 02/10/2012 0850   ALT 39 02/16/2012 1117   ALT 39* 02/10/2012 0850   BILITOT 0.60 02/16/2012 1117   BILITOT 0.4 02/10/2012 0850     DOB: 1961-01-24 Age: 17 Gender: F Client Name [place] Collected Date: 12/19/2011 Received Date: 12/19/2011 Physician: Cyndia Bent Chart #: MRN # : [id number] Physician cc: Richardean Chimera, MD Sherian Rein, MD Race:W Visit #: [id number] Kaylyn Lim, RN REPORT OF SURGICAL PATHOLOGY ADDITIONAL INFORMATION: 5. PROGNOSTIC INDICATORS - ACIS Results IMMUNOHISTOCHEMICAL AND MORPHOMETRIC ANALYSIS BY THE AUTOMATED CELLULAR IMAGING SYSTEM (ACIS) Estrogen Receptor (Negative, <1%): 100%, STRONG STAINING INTENSITY Progesterone Receptor (Negative, <1%): 100%, STRONG STAINING INTENSITY Proliferation Marker Ki67 by Ross  IB-1 (Low<20%): 11% All controls stained appropriately Pecola Leisure MD Pathologist, Electronic Signature ( Signed 12/29/2011) 5. CHROMOGENIC IN-SITU HYBRIDIZATION Interpretation: HER2/NEU BY CISH - SHOWS AMPLIFICATION BY CISH ANALYSIS. THE RATIO OF HER2: CEP 17 SIGNALS WAS 3.27 Reference range: Ratio: HER2:CEP17 < 1.8 gene amplification not observed Ratio: HER2:CEP 17 1.8-2.2 - equivocal result Ratio: HER2:CEP17 > 2.2 - gene amplification observed Pecola Leisure MD Pathologist, Electronic Signature ( Signed 12/27/2011) 1 of 4 FINAL for Julie Ross, Julie Ross ([id number]) FINAL DIAGNOSIS Diagnosis 1. Lymph node, sentinel, biopsy, Left axillary - ONE BENIGN LYMPH NODE WITH NO TUMOR SEEN (0/1). 2. Lymph node, sentinel, biopsy, Left axilla - ONE LYMPH NODE POSITIVE FOR METASTATIC DUCTAL CARCINOMA (1/1). - SEE COMMENT. 3. Lymph node, sentinel, biopsy, Left axilla - ONE BENIGN LYMPH NODE WITH NO TUMOR SEEN (0/1). 4. Lymph node, sentinel, biopsy, Left axilla - ONE BENIGN LYMPH NODE WITH NO TUMOR SEEN (0/1). 5. Breast, simple mastectomy, Left - INVASIVE GRADE I DUCTAL CARCINOMA, TWO FOCI MEASURING 0.8 CM AND 0.6 CM ARISING IN A BACKGROUND OF EXTENSIVE INTERMEDIATE GRADE DUCTAL CARCINOMA IN SITU WITH NECROSIS. - MARGINS ARE NEGATIVE. - SEE ONCOLOGY TEMPLATE. Microscopic Comment 2. On re-review of the frozen section slides, no metastatic carcinoma is identified. The carcinoma present is on permanent sections only. 5. BREAST, INVASIVE TUMOR, WITH LYMPH NODE SAMPLING Specimen, including laterality: Left breast with sentinel lymph node. Procedure: Left simple mastectomy with sentinel lymph node biopsy. Grade: I (Both foci of tumors show similar morphology). Tubule formation: 2. Nuclear pleomorphism: 2. Mitotic: 1. Tumor size (glass slide measurement): Two foci measuring 0.8 cm and 0.6 cm. Margins: Invasive, distance to closest margin: At least 1.5 cm. In-situ, distance to closest margin: At  least 1.5 cm. Lymphovascular invasion: No definite lymph/vascular invasion is not identified however a sentinel lymph node is positive (see below). Ductal carcinoma in situ: Yes. Grade: Intermediate grade. Extensive intraductal component: Yes. Lobular neoplasia: Not identified. Tumor focality: Two foci. Treatment effect: Not applicable. Extent of tumor: Tumor confined to breast parenchyma. Lymph nodes: # examined: 4. Lymph nodes with metastasis: 1. Macrometastasis: (> 2.0 mm): 1. Extracapsular extension: Not identified.  Breast prognostic profile: A breast prognostic profile will be performed (see below comment). Additional breast findings: Fibrocystic changes with usual ductal hyperplasia. TNM: mpT1b, pN1a, MX. Comment: The palpable nodular area seen grossly is comprised predominantly of intermediate grade ductal carcinoma in situ with only two foci of invasive ductal carcinoma. Both invasive foci are morphologically similar. As such a breast prognostic profile will only be performed on the larger tumor. A breast prognostic profile can be performed upon the smaller tumor per request. (RH:kh 12-21-11) 2 of 4 FINAL for Julie Ross, Julie Ross ([id number]) Microscopic Comment(continued) Zandra Abts MD Pathologist, Electronic Signature (Case signed 12/21/2011) Intraoperative Diagnosis 1. RECEIVED FRESH IS TISSUE THAT CONTAINS A LYMPH NODE WHICH IS SAMPLED FOR RIOC. LEFT AXILLARY SENTINEL LYMPH NODE, FROZEN SECTION AND TOUCH PREPARATION- ONE LYMPH NODE WITH NO METASTATIC CARCINOMA IDENTIFIED. (RAH) 2. RECEIVED FRESH IS TISSUE THAT CONTAINS A LYMPH NODE WHICH IS SAMPLED FOR RIOC. LEFT AXILLARY SENTINEL LYMPH NODE, FROZEN SECTION AND TOUCH PREPARATION- ONE LYMPH NODE WITH NO METASTATIC CARCINOMA IDENTIFIED. (RAH) 3. RECEIVED FRESH IS TISSUE THAT CONTAINS A LYMPH NODE WHICH IS SAMPLED FOR RIOC. LEFT AXILLARY SENTINEL LYMPH NODE, FROZEN SECTION AND TOUCH PREPARATION- ONE LYMPH NODE WITH NO  METASTATIC CARCINOMA IDENTIFIED. (RAH) 4. RECEIVED FRESH IS TISSUE THAT CONTAINS A LYMPH NODE WHICH IS SAMPLED FOR RIOC. LEFT AXILLARY SENTINEL LYMPH NODE, FROZEN SECTION AND TOUCH PREPARATION- ONE LYMPH NODE WITH NO METASTATIC CARCINOMA IDENTIFIED. (RAH) Specimen Gross and Clinical Information Specimen(s) Obtained: 1. Lymph node, sentinel, biopsy, Left axillary 2. Lymph node, sentinel, biopsy, Left axilla 3. Lymph node, sentinel, biopsy, Left axilla 4. Lymph node, sentinel, biopsy, Left axilla 5. Breast, simple mastectomy, Left Specimen Clinical Information 1. Left breast cancer (tl) Gross 1. Rapid Intraoperative Consult performed (Yes or No): Yes. Specimen: Left axillary sentinel node. Number and size: One node, 2.2 cm Cut Surface(s): Soft, fatty, focally blue. Block Summary: The node is sectioned, touch preparations are made on one slide, and the tissue is submitted in one block labeled SLN for frozen section. 2. Rapid Intraoperative Consult performed (Yes or No): Yes. Specimen: Left axillary sentinel node. Number and size: One node, 2.1 cm. Cut Surface(s): Soft, fatty, focally blue. Block Summary: The node is bisected, touch preparations are made on one slide, and is submitted in one block labeled SLN for frozen section. 3. Rapid Intraoperative Consult performed (Yes or No): Yes. Specimen: Left axillary sentinel node. Number and size: One node, 0.7 cm Cut Surface(s): Soft, fatty, focally blue. Block Summary: The node is bisected, touch preparations are made on one slide, and is submitted in one block labeled SLN for frozen section. 4. Rapid Intraoperative Consult performed (Yes or No): Yes. Specimen: Left axillary sentinel node. Number and size: One node, 1.5 cm. 3 of 4 FINAL for Julie Ross, Julie Ross ([id number]) Gross(continued) Cut Surface(s): Soft, fatty, non blue. Block Summary: The node is bisected, touch preparations are made on one slide and is submitted in one block  labeled SLN for frozen section. (SW:gt, 12/20/11) 5. Specimen: Left simple mastectomy, received fresh. The specimen was placed in formalin at 2:50 p.Ross. on 12/19/11. Specimen integrity (intact/disrupted): Intact. Weight: 1185 grams. Size: 28 x 24.5 x 7 cm. Skin: There is an attached 16 x 7.5 cm ellipse of skin with a central, unremarkable nipple. There is a suture attached at the superior edge. Tumor/cavity: In the mid central to mid lateral breast there is an ill defined area of palpable nodular fibrous tissue without a discrete mass identified. This area measures approximately  7 x 6 x 3 cm and extends to within 1.5 cm of the deep margin. Within this area there is a 1.5 cm cavitary defect, consistent with a previous biopsy site. There is a silver metallic biopsy clip present at the defect. Uninvolved parenchyma: The remainder of the breast tissue consists of fat and dense white fibrous tissue. Prognostic indicators: Obtain from paraffin blocks if needed. Lymph nodes: There are no lymph nodes identified. Block summary: 13 blocks submitted. A = deep margin at nodular area. B - I = sections of nodular area, sequentially labeled from lateral through medial, margins not included. J = lower medial. K = upper medial. L = lower lateral. Ross = upper lateral. (GP:eps 12/20/11)  RADIOGRAPHIC STUDIES:  Nm Sentinel Node Inj-no Rpt (breast)  12/19/2011  CLINICAL DATA: breast cancer   Sulfur colloid was injected intradermally by the nuclear medicine  technologist for breast cancer sentinel node localization.      ASSESSMENT: 51 year old female with  #1 stage II invasive ductal carcinoma of the left breast with ductal carcinoma in situ. Patient is status post mastectomy that showed 2 foci of invasive cancer measuring 0.8 0.6 cm. The tumor is ER positive PR positive HER-2/neu amplified at 3.27. Patient is now seen in medical oncology for discussion of adjuvant treatment. As mentioned above her case was  discussed at the multidisciplinary breast conference. Patient is noted to have one of 4 lymph nodes positive for metastatic disease. This was only axillary sentinel node biopsy. A discussion of whether patient under should undergo a full axillary lymph node dissection did take place but at the end of our extensive discussion it was recommended the patient will proceed with radiation and DOS she would feel the criteria of seed 11. Therefore is excellent lymph node dissection is not being done. Instead the patient will proceed with adjuvant chemotherapy and Herceptin. Her chemotherapy will consist of Taxotere carboplatinum given every 21 days. For the duration of her chemotherapy patient will get Herceptin on a weekly basis. Once she completes the chemotherapy she then will get Herceptin every 3 weeks. After completion of chemotherapy patient will proceed with radiation therapy with concurrent Herceptin. After completion of radiation she will start Herceptin and antiestrogen therapy consisting of either tamoxifen or an aromatase inhibitor.  #2 patient does have history of multiple sclerosis and fatigue related to that. Certainly she will get fatigued from her chemotherapy as well we will keep her neurologist very much in the [place] to help manage her symptoms.  #3 the hydration and nausea with associated weakness especially when she receives chemotherapy  #4 inflammation of hemorrhoids without any bleeding resolved   PLAN:   1. Proceed with chemotherapy TCH with IVF and ativan on 02/17/12  2. Come on 8/31 for neulasta and IVF.  #3 Patient will return in one week's time for followup. She will also receive IV fluids and anti-emetics if needed. She is also recommended to call us with any problems in the interim.  All questions were answered. The patient knows to call the clinic with any problems, questions or concerns. We can certainly see the patient much sooner if necessary.  I spent 25 minutes  counseling the patient face to face. The total time spent in the appointment was 30 minutes.    Drue Second, MD Medical/Oncology [facility] [phone] (beeper) [phone] (Office)  02/16/2012, 12:57 PM

## 2012-02-16 NOTE — Telephone Encounter (Signed)
IVF on 8/30 with chemotherapy, IVF on 02/18/12 Sent michelle email to set up patient fliuds

## 2012-02-16 NOTE — Patient Instructions (Addendum)
Doing well  Proceed with chemotherapy on 02/17/12

## 2012-02-17 ENCOUNTER — Ambulatory Visit (HOSPITAL_BASED_OUTPATIENT_CLINIC_OR_DEPARTMENT_OTHER): Payer: BC Managed Care – PPO

## 2012-02-17 VITALS — BP 162/78 | HR 102 | Temp 97.9°F | Resp 20

## 2012-02-17 DIAGNOSIS — C50419 Malignant neoplasm of upper-outer quadrant of unspecified female breast: Secondary | ICD-10-CM

## 2012-02-17 DIAGNOSIS — C773 Secondary and unspecified malignant neoplasm of axilla and upper limb lymph nodes: Secondary | ICD-10-CM

## 2012-02-17 DIAGNOSIS — Z5111 Encounter for antineoplastic chemotherapy: Secondary | ICD-10-CM

## 2012-02-17 DIAGNOSIS — Z5112 Encounter for antineoplastic immunotherapy: Secondary | ICD-10-CM

## 2012-02-17 MED ORDER — SODIUM CHLORIDE 0.9 % IJ SOLN
10.0000 mL | INTRAMUSCULAR | Status: DC | PRN
  Administered 2012-02-17: 10 mL
  Filled 2012-02-17: qty 10

## 2012-02-17 MED ORDER — SODIUM CHLORIDE 0.9 % IV SOLN
INTRAVENOUS | Status: DC
  Administered 2012-02-17: 14:00:00 via INTRAVENOUS

## 2012-02-17 MED ORDER — DIPHENHYDRAMINE HCL 25 MG PO CAPS
50.0000 mg | ORAL_CAPSULE | Freq: Once | ORAL | Status: AC
  Administered 2012-02-17: 50 mg via ORAL

## 2012-02-17 MED ORDER — TRASTUZUMAB CHEMO INJECTION 440 MG
2.0000 mg/kg | Freq: Once | INTRAVENOUS | Status: AC
  Administered 2012-02-17: 189 mg via INTRAVENOUS
  Filled 2012-02-17: qty 9

## 2012-02-17 MED ORDER — DOCETAXEL CHEMO INJECTION 160 MG/16ML
75.0000 mg/m2 | Freq: Once | INTRAVENOUS | Status: AC
  Administered 2012-02-17: 150 mg via INTRAVENOUS
  Filled 2012-02-17: qty 15

## 2012-02-17 MED ORDER — HEPARIN SOD (PORK) LOCK FLUSH 100 UNIT/ML IV SOLN
500.0000 [IU] | Freq: Once | INTRAVENOUS | Status: AC | PRN
  Administered 2012-02-17: 500 [IU]
  Filled 2012-02-17: qty 5

## 2012-02-17 MED ORDER — ONDANSETRON 8 MG/50ML IVPB (CHCC): 8.0000 mg | Freq: Once | INTRAVENOUS | Status: DC

## 2012-02-17 MED ORDER — ONDANSETRON 16 MG/50ML IVPB (CHCC)
16.0000 mg | Freq: Once | INTRAVENOUS | Status: AC
  Administered 2012-02-17: 16 mg via INTRAVENOUS

## 2012-02-17 MED ORDER — DEXAMETHASONE SODIUM PHOSPHATE 4 MG/ML IJ SOLN
20.0000 mg | Freq: Once | INTRAMUSCULAR | Status: AC
  Administered 2012-02-17: 20 mg via INTRAVENOUS

## 2012-02-17 MED ORDER — ACETAMINOPHEN 325 MG PO TABS
650.0000 mg | ORAL_TABLET | Freq: Once | ORAL | Status: AC
  Administered 2012-02-17: 650 mg via ORAL

## 2012-02-17 MED ORDER — SODIUM CHLORIDE 0.9 % IV SOLN
778.8000 mg | Freq: Once | INTRAVENOUS | Status: AC
  Administered 2012-02-17: 780 mg via INTRAVENOUS
  Filled 2012-02-17: qty 78

## 2012-02-17 MED ORDER — LORAZEPAM 2 MG/ML IJ SOLN
0.5000 mg | Freq: Once | INTRAMUSCULAR | Status: AC
  Administered 2012-02-17: 0.5 mg via INTRAVENOUS

## 2012-02-17 NOTE — Patient Instructions (Addendum)
[facility] Discharge Instructions for Patients Receiving Chemotherapy  Today you received the following chemotherapy agents Taxotere, Carboplatin, Herceptin.  To help prevent nausea and vomiting after your treatment, we encourage you to take your nausea medication as prescribed.    If you develop nausea and vomiting that is not controlled by your nausea medication, call the clinic. If it is after clinic hours your family physician or the after hours number for the clinic or go to the Emergency Department.   BELOW ARE SYMPTOMS THAT SHOULD BE REPORTED IMMEDIATELY:  *FEVER GREATER THAN 100.5 F  *CHILLS WITH OR WITHOUT FEVER  NAUSEA AND VOMITING THAT IS NOT CONTROLLED WITH YOUR NAUSEA MEDICATION  *UNUSUAL SHORTNESS OF BREATH  *UNUSUAL BRUISING OR BLEEDING  TENDERNESS IN MOUTH AND THROAT WITH OR WITHOUT PRESENCE OF ULCERS  *URINARY PROBLEMS  *BOWEL PROBLEMS  UNUSUAL RASH Items with * indicate a potential emergency and should be followed up as soon as possible.  One of the nurses will contact you 24 hours after your treatment. Please let the nurse know about any problems that you may have experienced. Feel free to call the clinic you have any questions or concerns. The clinic phone number is [phone].   I have been informed and understand all the instructions given to me. I know to contact the clinic, my physician, or go to the Emergency Department if any problems should occur. I do not have any questions at this time, but understand that I may call the clinic during office hours   should I have any questions or need assistance in obtaining follow up care.    __________________________________________  _____________  __________ Signature of Patient or Authorized Representative            Date                   Time    __________________________________________ Nurse's Signature

## 2012-02-18 ENCOUNTER — Ambulatory Visit (HOSPITAL_BASED_OUTPATIENT_CLINIC_OR_DEPARTMENT_OTHER): Payer: BC Managed Care – PPO

## 2012-02-18 DIAGNOSIS — C50419 Malignant neoplasm of upper-outer quadrant of unspecified female breast: Secondary | ICD-10-CM

## 2012-02-18 DIAGNOSIS — Z5189 Encounter for other specified aftercare: Secondary | ICD-10-CM

## 2012-02-18 DIAGNOSIS — C773 Secondary and unspecified malignant neoplasm of axilla and upper limb lymph nodes: Secondary | ICD-10-CM

## 2012-02-18 MED ORDER — SODIUM CHLORIDE 0.9 % IV SOLN
INTRAVENOUS | Status: DC
  Administered 2012-02-18: 11:00:00 via INTRAVENOUS

## 2012-02-18 MED ORDER — PEGFILGRASTIM INJECTION 6 MG/0.6ML
6.0000 mg | Freq: Once | SUBCUTANEOUS | Status: AC
  Administered 2012-02-18: 6 mg via SUBCUTANEOUS

## 2012-02-18 MED ORDER — LORAZEPAM 2 MG/ML IJ SOLN
0.5000 mg | Freq: Once | INTRAMUSCULAR | Status: AC
  Administered 2012-02-18: 0.5 mg via INTRAVENOUS

## 2012-02-22 ENCOUNTER — Ambulatory Visit (INDEPENDENT_AMBULATORY_CARE_PROVIDER_SITE_OTHER): Payer: BC Managed Care – PPO | Admitting: Surgery

## 2012-02-22 ENCOUNTER — Encounter (INDEPENDENT_AMBULATORY_CARE_PROVIDER_SITE_OTHER): Payer: Self-pay | Admitting: Surgery

## 2012-02-22 VITALS — BP 126/76 | HR 100 | Temp 97.6°F | Resp 20 | Ht 66.0 in | Wt 199.0 lb

## 2012-02-22 DIAGNOSIS — Z09 Encounter for follow-up examination after completed treatment for conditions other than malignant neoplasm: Secondary | ICD-10-CM

## 2012-02-22 NOTE — Progress Notes (Signed)
  Drainage almost stopped > drain pulled If no fluid re-accumulation will see in three months

## 2012-02-24 ENCOUNTER — Other Ambulatory Visit: Payer: BC Managed Care – PPO | Admitting: Lab

## 2012-02-24 ENCOUNTER — Ambulatory Visit: Payer: BC Managed Care – PPO | Admitting: Oncology

## 2012-02-24 ENCOUNTER — Encounter: Payer: Self-pay | Admitting: Oncology

## 2012-02-24 ENCOUNTER — Ambulatory Visit (HOSPITAL_BASED_OUTPATIENT_CLINIC_OR_DEPARTMENT_OTHER): Payer: BC Managed Care – PPO

## 2012-02-24 ENCOUNTER — Telehealth: Payer: Self-pay | Admitting: *Deleted

## 2012-02-24 VITALS — BP 123/78 | HR 94 | Temp 98.8°F | Resp 20 | Ht 66.0 in | Wt 198.1 lb

## 2012-02-24 DIAGNOSIS — C50419 Malignant neoplasm of upper-outer quadrant of unspecified female breast: Secondary | ICD-10-CM

## 2012-02-24 DIAGNOSIS — C773 Secondary and unspecified malignant neoplasm of axilla and upper limb lymph nodes: Secondary | ICD-10-CM

## 2012-02-24 DIAGNOSIS — Z5112 Encounter for antineoplastic immunotherapy: Secondary | ICD-10-CM

## 2012-02-24 DIAGNOSIS — R0602 Shortness of breath: Secondary | ICD-10-CM

## 2012-02-24 DIAGNOSIS — E860 Dehydration: Secondary | ICD-10-CM

## 2012-02-24 LAB — CBC WITH DIFFERENTIAL/PLATELET
BASO%: 0.6 % (ref 0.0–2.0)
Basophils Absolute: 0 10*3/uL (ref 0.0–0.1)
EOS%: 3.6 % (ref 0.0–7.0)
Eosinophils Absolute: 0.1 10*3/uL (ref 0.0–0.5)
HCT: 32.5 % — ABNORMAL LOW (ref 34.8–46.6)
HGB: 10.9 g/dL — ABNORMAL LOW (ref 11.6–15.9)
LYMPH%: 33.8 % (ref 14.0–49.7)
MCH: 26.4 pg (ref 25.1–34.0)
MCHC: 33.5 g/dL (ref 31.5–36.0)
MCV: 78.7 fL — ABNORMAL LOW (ref 79.5–101.0)
MONO#: 0.3 10*3/uL (ref 0.1–0.9)
MONO%: 10.1 % (ref 0.0–14.0)
NEUT#: 1.6 10*3/uL (ref 1.5–6.5)
NEUT%: 51.9 % (ref 38.4–76.8)
Platelets: 132 10*3/uL — ABNORMAL LOW (ref 145–400)
RBC: 4.13 10*6/uL (ref 3.70–5.45)
RDW: 15.7 % — ABNORMAL HIGH (ref 11.2–14.5)
WBC: 3.1 10*3/uL — ABNORMAL LOW (ref 3.9–10.3)
lymph#: 1 10*3/uL (ref 0.9–3.3)

## 2012-02-24 LAB — COMPREHENSIVE METABOLIC PANEL (CC13)
ALT: 68 U/L — ABNORMAL HIGH (ref 0–55)
AST: 35 U/L — ABNORMAL HIGH (ref 5–34)
Albumin: 3.8 g/dL (ref 3.5–5.0)
Alkaline Phosphatase: 187 U/L — ABNORMAL HIGH (ref 40–150)
BUN: 13 mg/dL (ref 7.0–26.0)
CO2: 27 mEq/L (ref 22–29)
Calcium: 9.4 mg/dL (ref 8.4–10.4)
Chloride: 98 mEq/L (ref 98–107)
Creatinine: 0.8 mg/dL (ref 0.6–1.1)
Glucose: 99 mg/dl (ref 70–99)
Potassium: 3.7 mEq/L (ref 3.5–5.1)
Sodium: 136 mEq/L (ref 136–145)
Total Bilirubin: 1 mg/dL (ref 0.20–1.20)
Total Protein: 6.5 g/dL (ref 6.4–8.3)

## 2012-02-24 MED ORDER — HEPARIN SOD (PORK) LOCK FLUSH 100 UNIT/ML IV SOLN
500.0000 [IU] | Freq: Once | INTRAVENOUS | Status: AC | PRN
  Administered 2012-02-24: 500 [IU]
  Filled 2012-02-24: qty 5

## 2012-02-24 MED ORDER — DIPHENHYDRAMINE HCL 25 MG PO CAPS
50.0000 mg | ORAL_CAPSULE | Freq: Once | ORAL | Status: AC
  Administered 2012-02-24: 50 mg via ORAL

## 2012-02-24 MED ORDER — SODIUM CHLORIDE 0.9 % IJ SOLN
10.0000 mL | INTRAMUSCULAR | Status: DC | PRN
  Administered 2012-02-24: 10 mL
  Filled 2012-02-24: qty 10

## 2012-02-24 MED ORDER — ONDANSETRON 8 MG/50ML IVPB (CHCC): 8.0000 mg | Freq: Once | INTRAVENOUS | Status: DC

## 2012-02-24 MED ORDER — SODIUM CHLORIDE 0.9 % IV SOLN
Freq: Once | INTRAVENOUS | Status: AC
  Administered 2012-02-24: 13:00:00 via INTRAVENOUS

## 2012-02-24 MED ORDER — LORAZEPAM 2 MG/ML IJ SOLN
1.0000 mg | Freq: Once | INTRAMUSCULAR | Status: AC
  Administered 2012-02-24: 1 mg via INTRAVENOUS

## 2012-02-24 MED ORDER — SODIUM CHLORIDE 0.9 % IV SOLN
2.0000 mg/kg | Freq: Once | INTRAVENOUS | Status: AC
  Administered 2012-02-24: 189 mg via INTRAVENOUS
  Filled 2012-02-24: qty 9

## 2012-02-24 MED ORDER — ACETAMINOPHEN 325 MG PO TABS
650.0000 mg | ORAL_TABLET | Freq: Once | ORAL | Status: AC
  Administered 2012-02-24: 650 mg via ORAL

## 2012-02-24 NOTE — Patient Instructions (Addendum)
 [facility] Center Discharge Instructions for Patients Receiving Chemotherapy  Today you received the following chemotherapy agents herceptin  To help prevent nausea and vomiting after your treatment, we encourage you to take your nausea medication  As directed   If you develop nausea and vomiting that is not controlled by your nausea medication, call the clinic. If it is after clinic hours your family physician or the after hours number for the clinic or go to the Emergency Department.   BELOW ARE SYMPTOMS THAT SHOULD BE REPORTED IMMEDIATELY:  *FEVER GREATER THAN 100.5 F  *CHILLS WITH OR WITHOUT FEVER  NAUSEA AND VOMITING THAT IS NOT CONTROLLED WITH YOUR NAUSEA MEDICATION  *UNUSUAL SHORTNESS OF BREATH  *UNUSUAL BRUISING OR BLEEDING  TENDERNESS IN MOUTH AND THROAT WITH OR WITHOUT PRESENCE OF ULCERS  *URINARY PROBLEMS  *BOWEL PROBLEMS  UNUSUAL RASH Items with * indicate a potential emergency and should be followed up as soon as possible.  One of the nurses will contact you 24 hours after your treatment. Please let the nurse know about any problems that you may have experienced. Feel free to call the clinic you have any questions or concerns. The clinic phone number is [phone].   I have been informed and understand all the instructions given to me. I know to contact the clinic, my physician, or go to the Emergency Department if any problems should occur. I do not have any questions at this time, but understand that I may call the clinic during office hours   should I have any questions or need assistance in obtaining follow up care.    __________________________________________  _____________  __________ Signature of Patient or Authorized Representative            Date                   Time    __________________________________________ Nurse's Signature

## 2012-02-24 NOTE — Telephone Encounter (Signed)
Sent michelle email to add on fluids on 02-25-2012 and 02-27-2012

## 2012-02-24 NOTE — Progress Notes (Signed)
OFFICE PROGRESS NOTE  CC  Julie Ross [address] [place] [address] Dr. Cyndia Bent Dr. Richardean Chimera Dr. Mardella Layman Dr. Gustavus Messing ([facility])  DIAGNOSIS: 51 year old female who originally was seen in the multidisciplinary breast clinic for discussion of new diagnosis of left breast cancer. She is now status post left mastectomy with the final pathology revealing an invasive ductal carcinoma that is ER positive PR positive HER-2/neu positive with Ki-67 11%.  PRIOR THERAPY:  #1 patient was originally seen in the multidisciplinary breast clinic on 11/30/2011 for what at that time looked like a ductal carcinoma in situ of the left breast. Due to the extent of disease she was recommended a mastectomy with axillary lymph node sampling.  #2 12/19/2011 patient had a left mastectomy with sentinel lymph node biopsy. Her final pathology revealed 2 foci of invasive ductal carcinoma measuring 0.8 and 0.6 cm. The tumor was low grade ER positive PR positive. Patient's tumor was found to be HER-2/neu positive with HER-2 ratio of 3.27. Ki-67 was 11%. Patient had sentinel lymph node  Biopsies performed one of 4 lymph nodes were positive for metastatic disease. Patient's final pathologic staging is T1 N1 M0. Stage II  #3 patient is receiving adjuvant chemotherapy consisting of Taxotere carboplatinum administered every 21 days with weekly Herceptin. A total of 4 cycles of Taxotere carboplatinum are planned.  CURRENT THERAPY: Patient is here for [facility] on 8/30  INTERVAL HISTORY: Julie Ross 51 y.o. female returns for Followup visit. Clinically patient seems to have had more issues with the Taxotere or and carboplatinum that she received last week. She is very tired fatigued. She also does not drink as much as she possibly wants to do to just overall poor performance status. She is having aches and pains. She does develop nausea and she does take her  antiemetics. She also has difficulty and trouble sleeping she is having issues with her constipation and subsequent hemorrhoids. She is also complaining of knee pain. But she is planning on seeing her orthopedic surgeon soon. Remainder of the 10 point review of systems is unremarkable.  MEDICAL HISTORY: Past Medical History  Diagnosis Date  . Hypertension   . Hypothyroidism   . Arthritis   . Neuromuscular disorder     MS TX WITH CYMBALTA   . Multiple sclerosis   . H/O colonoscopy   . H/O bone density study 03/2011  . Wears glasses   . Heart burn   . Depression   . Sleep apnea     MILD SLEEP APNEA, NO MACHINE  6 YRS AGO HPT [facility]   . Breast cancer     lt breast    ALLERGIES:   has no known allergies.  MEDICATIONS:  Current Outpatient Prescriptions  Medication Sig Dispense Refill  . baclofen (LIORESAL) 10 MG tablet Take 10 mg by mouth 3 (three) times daily.      Marland Kitchen CALCIUM-MAGNESIUM PO Take 1 tablet by mouth daily.      . clonazePAM (KLONOPIN) 0.5 MG tablet Take 0.5 mg by mouth at bedtime as needed. For sleep      . dexamethasone (DECADRON) 4 MG tablet Take 2 tablets two times a day the day before Taxotere. Then take 2 tabs two times a day starting the day after chemo for 3 days.  30 tablet  1  . DULoxetine (CYMBALTA) 60 MG capsule Take 60 mg by mouth daily.      Marland Kitchen esomeprazole (NEXIUM) 40 MG  capsule Take 40 mg by mouth daily before breakfast.      . gabapentin (NEURONTIN) 300 MG capsule Take 600 mg by mouth 3 (three) times daily.       Marland Kitchen HYDROcodone-acetaminophen (NORCO) 10-325 MG per tablet       . lidocaine-prilocaine (EMLA) cream Apply topically as needed.  30 g  0  . LORazepam (ATIVAN) 0.5 MG tablet Take 1 tablet (0.5 mg total) by mouth every 6 (six) hours as needed (Nausea or vomiting).  30 tablet  0  . losartan-hydrochlorothiazide (HYZAAR) 50-12.5 MG per tablet Take 1 tablet by mouth daily.      . Multiple Vitamins-Minerals (MULTIVITAMINS THER. W/MINERALS) TABS Take 1  tablet by mouth daily.      . naproxen sodium (ANAPROX) 550 MG tablet Take 550 mg by mouth as needed.      . ondansetron (ZOFRAN) 8 MG tablet Take 1 tablet two times a day starting the day after chemo for 3 days. Then take 1 tab two times a day as needed for nausea or vomiting.  30 tablet  1  . prochlorperazine (COMPAZINE) 10 MG tablet Take 1 tablet (10 mg total) by mouth every 6 (six) hours as needed (Nausea or vomiting).  30 tablet  1  . UNABLE TO FIND Cranial Prothesis  1 each  0  . prochlorperazine (COMPAZINE) 25 MG suppository Place 1 suppository (25 mg total) rectally every 12 (twelve) hours as needed for nausea.  12 suppository  3    SURGICAL HISTORY:  Past Surgical History  Procedure Date  . Tonsillectomy   . Tumor removal     FROM PELVIS AGE 57  . Cesarean section     X2   . No past surgeries     BLADDER SLING  4-5 YRS AGO   . Knee arthroscopy     LT KNEE 04/2011  . Total knee arthroplasty 08/15/2011    Procedure: TOTAL KNEE ARTHROPLASTY; lft Surgeon: Harvie Junior, MD;  Location: [facility] OR;  Service: Orthopedics;  Laterality: Left;  RIGHT KNEE CORTIZONE INJECTION  . Mohs surgery   . Knee arthroscopy 82    rt  . Joint replacement Feb 2013  . Mastectomy w/ sentinel node biopsy 12/19/2011    Procedure: MASTECTOMY WITH SENTINEL LYMPH NODE BIOPSY;  Surgeon: Currie Paris, MD;  Location: [facility] OR;  Service: General;  Laterality: Left;  left breast and left axilla   . Portacath placement 01/16/2012    Procedure: INSERTION PORT-A-CATH;  Surgeon: Currie Paris, MD;  Location: [facility];  Service: General;  Laterality: Right;  Porta Cath Placement     REVIEW OF SYSTEMS:  Pertinent items are noted in HPI.   PHYSICAL EXAMINATION: General appearance: alert, cooperative and appears stated age Neck: no adenopathy, no carotid bruit, no JVD, supple, symmetrical, trachea midline and thyroid not enlarged, symmetric, no tenderness/mass/nodules Lymph nodes: Cervical,  supraclavicular, and axillary nodes normal. Resp: clear to auscultation bilaterally and normal percussion bilaterally Back: symmetric, no curvature. ROM normal. No CVA tenderness. Cardio: regular rate and rhythm, S1, S2 normal, no murmur, click, rub or gallop GI: soft, non-tender; bowel sounds normal; no masses,  no organomegaly Extremities: extremities normal, atraumatic, no cyanosis or edema Neurologic: Grossly normal  ECOG PERFORMANCE STATUS: 1 - Symptomatic but completely ambulatory  Blood pressure 123/78, pulse 94, temperature 98.8 F (37.1 C), temperature source Oral, resp. rate 20, height 5\' 6"  (1.676 m), weight 198 lb 1.6 oz (89.858 kg).  LABORATORY DATA: Lab Results  Component Value Date   WBC 3.1* 02/24/2012   HGB 10.9* 02/24/2012   HCT 32.5* 02/24/2012   MCV 78.7* 02/24/2012   PLT 132* 02/24/2012      Chemistry      Component Value Date/Time   NA 137 02/16/2012 1117   NA 138 02/10/2012 0850   K 3.7 02/16/2012 1117   K 3.8 02/10/2012 0850   CL 102 02/16/2012 1117   CL 101 02/10/2012 0850   CO2 26 02/16/2012 1117   CO2 27 02/10/2012 0850   BUN 12.0 02/16/2012 1117   BUN 14 02/10/2012 0850   CREATININE 1.0 02/16/2012 1117   CREATININE 0.95 02/10/2012 0850      Component Value Date/Time   CALCIUM 9.0 02/16/2012 1117   CALCIUM 9.0 02/10/2012 0850   ALKPHOS 204* 02/16/2012 1117   ALKPHOS 194* 02/10/2012 0850   AST 32 02/16/2012 1117   AST 29 02/10/2012 0850   ALT 39 02/16/2012 1117   ALT 39* 02/10/2012 0850   BILITOT 0.60 02/16/2012 1117   BILITOT 0.4 02/10/2012 0850     DOB: 1961-05-08 Age: 45 Gender: F Client Name . [facility] Collected Date: 12/19/2011 Received Date: 12/19/2011 Physician: Cyndia Bent Chart #: MRN # : [id number] Physician cc: Richardean Chimera, MD Sherian Rein, MD Race:W Visit #: [id number] Kaylyn Lim, RN REPORT OF SURGICAL PATHOLOGY ADDITIONAL INFORMATION: 5. PROGNOSTIC INDICATORS - ACIS Results IMMUNOHISTOCHEMICAL AND MORPHOMETRIC ANALYSIS BY  THE AUTOMATED CELLULAR IMAGING SYSTEM (ACIS) Estrogen Receptor (Negative, <1%): 100%, STRONG STAINING INTENSITY Progesterone Receptor (Negative, <1%): 100%, STRONG STAINING INTENSITY Proliferation Marker Ki67 by M IB-1 (Low<20%): 11% All controls stained appropriately Pecola Leisure MD Pathologist, Electronic Signature ( Signed 12/29/2011) 5. CHROMOGENIC IN-SITU HYBRIDIZATION Interpretation: HER2/NEU BY CISH - SHOWS AMPLIFICATION BY CISH ANALYSIS. THE RATIO OF HER2: CEP 17 SIGNALS WAS 3.27 Reference range: Ratio: HER2:CEP17 < 1.8 gene amplification not observed Ratio: HER2:CEP 17 1.8-2.2 - equivocal result Ratio: HER2:CEP17 > 2.2 - gene amplification observed Pecola Leisure MD Pathologist, Electronic Signature ( Signed 12/27/2011) 1 of 4 FINAL for Ehly, Roxanna M ([id number]) FINAL DIAGNOSIS Diagnosis 1. Lymph node, sentinel, biopsy, Left axillary - ONE BENIGN LYMPH NODE WITH NO TUMOR SEEN (0/1). 2. Lymph node, sentinel, biopsy, Left axilla - ONE LYMPH NODE POSITIVE FOR METASTATIC DUCTAL CARCINOMA (1/1). - SEE COMMENT. 3. Lymph node, sentinel, biopsy, Left axilla - ONE BENIGN LYMPH NODE WITH NO TUMOR SEEN (0/1). 4. Lymph node, sentinel, biopsy, Left axilla - ONE BENIGN LYMPH NODE WITH NO TUMOR SEEN (0/1). 5. Breast, simple mastectomy, Left - INVASIVE GRADE I DUCTAL CARCINOMA, TWO FOCI MEASURING 0.8 CM AND 0.6 CM ARISING IN A BACKGROUND OF EXTENSIVE INTERMEDIATE GRADE DUCTAL CARCINOMA IN SITU WITH NECROSIS. - MARGINS ARE NEGATIVE. - SEE ONCOLOGY TEMPLATE. Microscopic Comment 2. On re-review of the frozen section slides, no metastatic carcinoma is identified. The carcinoma present is on permanent sections only. 5. BREAST, INVASIVE TUMOR, WITH LYMPH NODE SAMPLING Specimen, including laterality: Left breast with sentinel lymph node. Procedure: Left simple mastectomy with sentinel lymph node biopsy. Grade: I (Both foci of tumors show similar morphology). Tubule formation:  2. Nuclear pleomorphism: 2. Mitotic: 1. Tumor size (glass slide measurement): Two foci measuring 0.8 cm and 0.6 cm. Margins: Invasive, distance to closest margin: At least 1.5 cm. In-situ, distance to closest margin: At least 1.5 cm. Lymphovascular invasion: No definite lymph/vascular invasion is not identified however a sentinel lymph node is positive (see below). Ductal carcinoma in situ: Yes. Grade: Intermediate grade. Extensive intraductal component: Yes.  Lobular neoplasia: Not identified. Tumor focality: Two foci. Treatment effect: Not applicable. Extent of tumor: Tumor confined to breast parenchyma. Lymph nodes: # examined: 4. Lymph nodes with metastasis: 1. Macrometastasis: (> 2.0 mm): 1. Extracapsular extension: Not identified. Breast prognostic profile: A breast prognostic profile will be performed (see below comment). Additional breast findings: Fibrocystic changes with usual ductal hyperplasia. TNM: mpT1b, pN1a, MX. Comment: The palpable nodular area seen grossly is comprised predominantly of intermediate grade ductal carcinoma in situ with only two foci of invasive ductal carcinoma. Both invasive foci are morphologically similar. As such a breast prognostic profile will only be performed on the larger tumor. A breast prognostic profile can be performed upon the smaller tumor per request. (RH:kh 12-21-11) 2 of 4 FINAL for Rosenow, Charne M ([id number]) Microscopic Comment(continued) Zandra Abts MD Pathologist, Electronic Signature (Case signed 12/21/2011) Intraoperative Diagnosis 1. RECEIVED FRESH IS TISSUE THAT CONTAINS A LYMPH NODE WHICH IS SAMPLED FOR RIOC. LEFT AXILLARY SENTINEL LYMPH NODE, FROZEN SECTION AND TOUCH PREPARATION- ONE LYMPH NODE WITH NO METASTATIC CARCINOMA IDENTIFIED. (RAH) 2. RECEIVED FRESH IS TISSUE THAT CONTAINS A LYMPH NODE WHICH IS SAMPLED FOR RIOC. LEFT AXILLARY SENTINEL LYMPH NODE, FROZEN SECTION AND TOUCH PREPARATION- ONE LYMPH NODE WITH NO  METASTATIC CARCINOMA IDENTIFIED. (RAH) 3. RECEIVED FRESH IS TISSUE THAT CONTAINS A LYMPH NODE WHICH IS SAMPLED FOR RIOC. LEFT AXILLARY SENTINEL LYMPH NODE, FROZEN SECTION AND TOUCH PREPARATION- ONE LYMPH NODE WITH NO METASTATIC CARCINOMA IDENTIFIED. (RAH) 4. RECEIVED FRESH IS TISSUE THAT CONTAINS A LYMPH NODE WHICH IS SAMPLED FOR RIOC. LEFT AXILLARY SENTINEL LYMPH NODE, FROZEN SECTION AND TOUCH PREPARATION- ONE LYMPH NODE WITH NO METASTATIC CARCINOMA IDENTIFIED. (RAH) Specimen Gross and Clinical Information Specimen(s) Obtained: 1. Lymph node, sentinel, biopsy, Left axillary 2. Lymph node, sentinel, biopsy, Left axilla 3. Lymph node, sentinel, biopsy, Left axilla 4. Lymph node, sentinel, biopsy, Left axilla 5. Breast, simple mastectomy, Left Specimen Clinical Information 1. Left breast cancer (tl) Gross 1. Rapid Intraoperative Consult performed (Yes or No): Yes. Specimen: Left axillary sentinel node. Number and size: One node, 2.2 cm Cut Surface(s): Soft, fatty, focally blue. Block Summary: The node is sectioned, touch preparations are made on one slide, and the tissue is submitted in one block labeled SLN for frozen section. 2. Rapid Intraoperative Consult performed (Yes or No): Yes. Specimen: Left axillary sentinel node. Number and size: One node, 2.1 cm. Cut Surface(s): Soft, fatty, focally blue. Block Summary: The node is bisected, touch preparations are made on one slide, and is submitted in one block labeled SLN for frozen section. 3. Rapid Intraoperative Consult performed (Yes or No): Yes. Specimen: Left axillary sentinel node. Number and size: One node, 0.7 cm Cut Surface(s): Soft, fatty, focally blue. Block Summary: The node is bisected, touch preparations are made on one slide, and is submitted in one block labeled SLN for frozen section. 4. Rapid Intraoperative Consult performed (Yes or No): Yes. Specimen: Left axillary sentinel node. Number and size: One node, 1.5  cm. 3 of 4 FINAL for Berntsen, Ophie M ([id number]) Gross(continued) Cut Surface(s): Soft, fatty, non blue. Block Summary: The node is bisected, touch preparations are made on one slide and is submitted in one block labeled SLN for frozen section. (SW:gt, 12/20/11) 5. Specimen: Left simple mastectomy, received fresh. The specimen was placed in formalin at 2:50 p.m. on 12/19/11. Specimen integrity (intact/disrupted): Intact. Weight: 1185 grams. Size: 28 x 24.5 x 7 cm. Skin: There is an attached 16 x 7.5 cm ellipse of skin with a central, unremarkable  nipple. There is a suture attached at the superior edge. Tumor/cavity: In the mid central to mid lateral breast there is an ill defined area of palpable nodular fibrous tissue without a discrete mass identified. This area measures approximately 7 x 6 x 3 cm and extends to within 1.5 cm of the deep margin. Within this area there is a 1.5 cm cavitary defect, consistent with a previous biopsy site. There is a silver metallic biopsy clip present at the defect. Uninvolved parenchyma: The remainder of the breast tissue consists of fat and dense white fibrous tissue. Prognostic indicators: Obtain from paraffin blocks if needed. Lymph nodes: There are no lymph nodes identified. Block summary: 13 blocks submitted. A = deep margin at nodular area. B - I = sections of nodular area, sequentially labeled from lateral through medial, margins not included. J = lower medial. K = upper medial. L = lower lateral. M = upper lateral. (GP:eps 12/20/11)  RADIOGRAPHIC STUDIES:  Nm Sentinel Node Inj-no Rpt (breast)  12/19/2011  CLINICAL DATA: breast cancer   Sulfur colloid was injected intradermally by the nuclear medicine  technologist for breast cancer sentinel node localization.      ASSESSMENT: 51 year old female with  #1 stage II invasive ductal carcinoma of the left breast with ductal carcinoma in situ. Patient is status post mastectomy that showed 2 foci of  invasive cancer measuring 0.8 0.6 cm. The tumor is ER positive PR positive HER-2/neu amplified at 3.27. Patient is now seen in medical oncology for discussion of adjuvant treatment. As mentioned above her case was discussed at the multidisciplinary breast conference. Patient is noted to have one of 4 lymph nodes positive for metastatic disease. This was only axillary sentinel node biopsy. A discussion of whether patient under should undergo a full axillary lymph node dissection did take place but at the end of our extensive discussion it was recommended the patient will proceed with radiation and DOS she would feel the criteria of seed 11. Therefore is excellent lymph node dissection is not being done. Instead the patient will proceed with adjuvant chemotherapy and Herceptin. Her chemotherapy will consist of Taxotere carboplatinum given every 21 days. For the duration of her chemotherapy patient will get Herceptin on a weekly basis. Once she completes the chemotherapy she then will get Herceptin every 3 weeks. After completion of chemotherapy patient will proceed with radiation therapy with concurrent Herceptin. After completion of radiation she will start Herceptin and antiestrogen therapy consisting of either tamoxifen or an aromatase inhibitor.  #2 patient does have history of multiple sclerosis and fatigue related to that. Certainly she will get fatigued from her chemotherapy as well we will keep her neurologist very much in the [place] to help manage her symptoms.  #3 the hydration and nausea with associated weakness especially when she receives chemotherapy  #4 inflammation of hemorrhoids without any bleeding resolved   PLAN:   1. Proceed With Herceptin only today. We will continue supportive therapy. Patient is having considerable amount of troubles with her chemotherapy especially when she receives the Taxotere or and carboplatinum.  #2 Patient will return in one week's time for followup. She will  also receive IV fluids and anti-emetics if needed. She is also recommended to call us with any problems in the interim.  All questions were answered. The patient knows to call the clinic with any problems, questions or concerns. We can certainly see the patient much sooner if necessary.  I spent 25 minutes counseling the patient face to face.  The total time spent in the appointment was 30 minutes.    Drue Second, MD Medical/Oncology [facility] ([place]) [phone] (beeper) [phone] (Office)  02/24/2012, 11:58 AM

## 2012-02-24 NOTE — Telephone Encounter (Signed)
Per staff message and POF I have scheduled appts.  JMW  

## 2012-02-24 NOTE — Patient Instructions (Addendum)
Proceed with herceptin today  I will see you back in 1 week  IVF on 9/7 and 9/9

## 2012-02-25 ENCOUNTER — Ambulatory Visit (HOSPITAL_BASED_OUTPATIENT_CLINIC_OR_DEPARTMENT_OTHER): Payer: BC Managed Care – PPO

## 2012-02-25 VITALS — BP 135/72 | HR 87 | Temp 97.8°F | Resp 20

## 2012-02-25 DIAGNOSIS — E860 Dehydration: Secondary | ICD-10-CM

## 2012-02-25 DIAGNOSIS — C50419 Malignant neoplasm of upper-outer quadrant of unspecified female breast: Secondary | ICD-10-CM

## 2012-02-25 DIAGNOSIS — R110 Nausea: Secondary | ICD-10-CM

## 2012-02-25 MED ORDER — SODIUM CHLORIDE 0.9 % IV SOLN
INTRAVENOUS | Status: DC
  Administered 2012-02-25: 10:00:00 via INTRAVENOUS

## 2012-02-27 ENCOUNTER — Ambulatory Visit (HOSPITAL_BASED_OUTPATIENT_CLINIC_OR_DEPARTMENT_OTHER): Payer: BC Managed Care – PPO

## 2012-02-27 VITALS — BP 130/70 | HR 80 | Temp 97.0°F | Resp 18

## 2012-02-27 DIAGNOSIS — E860 Dehydration: Secondary | ICD-10-CM

## 2012-02-27 DIAGNOSIS — R110 Nausea: Secondary | ICD-10-CM

## 2012-02-27 DIAGNOSIS — C50419 Malignant neoplasm of upper-outer quadrant of unspecified female breast: Secondary | ICD-10-CM

## 2012-02-27 MED ORDER — SODIUM CHLORIDE 0.9 % IV SOLN
INTRAVENOUS | Status: DC
  Administered 2012-02-27: 14:00:00 via INTRAVENOUS

## 2012-02-27 MED ORDER — SODIUM CHLORIDE 0.9 % IV SOLN: 1000.0000 mL | INTRAVENOUS | Status: AC

## 2012-02-27 NOTE — Patient Instructions (Addendum)
[facility] Discharge Instructions for Patients Receiving Chemotherapy  Today you received the following Normal Saline  To help prevent nausea and vomiting after your treatment, we encourage you to take your nausea medication Begin taking it at 7 pm and take it as often as prescribed for the next 24 to 72 hours.   If you develop nausea and vomiting that is not controlled by your nausea medication, call the clinic. If it is after clinic hours your family physician or the after hours number for the clinic or go to the Emergency Department.   BELOW ARE SYMPTOMS THAT SHOULD BE REPORTED IMMEDIATELY:  *FEVER GREATER THAN 100.5 F  *CHILLS WITH OR WITHOUT FEVER  NAUSEA AND VOMITING THAT IS NOT CONTROLLED WITH YOUR NAUSEA MEDICATION  *UNUSUAL SHORTNESS OF BREATH  *UNUSUAL BRUISING OR BLEEDING  TENDERNESS IN MOUTH AND THROAT WITH OR WITHOUT PRESENCE OF ULCERS  *URINARY PROBLEMS  *BOWEL PROBLEMS  UNUSUAL RASH Items with * indicate a potential emergency and should be followed up as soon as possible.  One of the nurses will contact you 24 hours after your treatment. Please let the nurse know about any problems that you may have experienced. Feel free to call the clinic you have any questions or concerns. The clinic phone number is [phone].   I have been informed and understand all the instructions given to me. I know to contact the clinic, my physician, or go to the Emergency Department if any problems should occur. I do not have any questions at this time, but understand that I may call the clinic during office hours   should I have any questions or need assistance in obtaining follow up care.    __________________________________________  _____________  __________ Signature of Patient or Authorized Representative            Date                   Time    __________________________________________ Nurse's Signature

## 2012-02-28 ENCOUNTER — Ambulatory Visit: Payer: BC Managed Care – PPO | Attending: Oncology | Admitting: Physical Therapy

## 2012-02-28 DIAGNOSIS — IMO0001 Reserved for inherently not codable concepts without codable children: Secondary | ICD-10-CM | POA: Insufficient documentation

## 2012-02-28 DIAGNOSIS — R269 Unspecified abnormalities of gait and mobility: Secondary | ICD-10-CM | POA: Insufficient documentation

## 2012-02-28 DIAGNOSIS — R293 Abnormal posture: Secondary | ICD-10-CM | POA: Insufficient documentation

## 2012-02-28 DIAGNOSIS — R279 Unspecified lack of coordination: Secondary | ICD-10-CM | POA: Insufficient documentation

## 2012-02-28 DIAGNOSIS — C50919 Malignant neoplasm of unspecified site of unspecified female breast: Secondary | ICD-10-CM | POA: Insufficient documentation

## 2012-03-01 ENCOUNTER — Other Ambulatory Visit: Payer: Self-pay | Admitting: Oncology

## 2012-03-01 ENCOUNTER — Ambulatory Visit: Payer: BC Managed Care – PPO

## 2012-03-01 ENCOUNTER — Encounter (HOSPITAL_COMMUNITY): Payer: Self-pay | Admitting: Pharmacy Technician

## 2012-03-02 ENCOUNTER — Encounter (HOSPITAL_COMMUNITY): Payer: Self-pay | Admitting: Anesthesiology

## 2012-03-02 ENCOUNTER — Inpatient Hospital Stay (HOSPITAL_COMMUNITY)
Admission: RE | Admit: 2012-03-02 | Discharge: 2012-03-08 | DRG: 558 | Disposition: A | Discharge status: Rehabilitation Facility | Payer: BC Managed Care – PPO | Source: Ambulatory Visit | Attending: Internal Medicine | Admitting: Internal Medicine

## 2012-03-02 ENCOUNTER — Other Ambulatory Visit: Payer: Self-pay | Admitting: Orthopedic Surgery

## 2012-03-02 ENCOUNTER — Other Ambulatory Visit: Payer: BC Managed Care – PPO | Admitting: Lab

## 2012-03-02 ENCOUNTER — Encounter (HOSPITAL_COMMUNITY): Payer: Self-pay | Admitting: *Deleted

## 2012-03-02 ENCOUNTER — Ambulatory Visit: Payer: BC Managed Care – PPO | Admitting: Oncology

## 2012-03-02 ENCOUNTER — Ambulatory Visit (HOSPITAL_COMMUNITY): Payer: BC Managed Care – PPO | Admitting: Anesthesiology

## 2012-03-02 ENCOUNTER — Ambulatory Visit: Payer: BC Managed Care – PPO

## 2012-03-02 ENCOUNTER — Encounter (HOSPITAL_COMMUNITY)
Admission: RE | Disposition: A | Discharge status: Rehabilitation Facility | Payer: Self-pay | Source: Ambulatory Visit | Attending: Orthopedic Surgery

## 2012-03-02 DIAGNOSIS — E860 Dehydration: Secondary | ICD-10-CM | POA: Diagnosis present

## 2012-03-02 DIAGNOSIS — M869 Osteomyelitis, unspecified: Secondary | ICD-10-CM | POA: Diagnosis present

## 2012-03-02 DIAGNOSIS — K5900 Constipation, unspecified: Secondary | ICD-10-CM | POA: Diagnosis not present

## 2012-03-02 DIAGNOSIS — G929 Unspecified toxic encephalopathy: Secondary | ICD-10-CM | POA: Diagnosis not present

## 2012-03-02 DIAGNOSIS — E039 Hypothyroidism, unspecified: Secondary | ICD-10-CM | POA: Diagnosis present

## 2012-03-02 DIAGNOSIS — I10 Essential (primary) hypertension: Secondary | ICD-10-CM | POA: Diagnosis present

## 2012-03-02 DIAGNOSIS — T8459XA Infection and inflammatory reaction due to other internal joint prosthesis, initial encounter: Secondary | ICD-10-CM

## 2012-03-02 DIAGNOSIS — T8450XA Infection and inflammatory reaction due to unspecified internal joint prosthesis, initial encounter: Principal | ICD-10-CM | POA: Diagnosis present

## 2012-03-02 DIAGNOSIS — N179 Acute kidney failure, unspecified: Secondary | ICD-10-CM | POA: Diagnosis not present

## 2012-03-02 DIAGNOSIS — B957 Other staphylococcus as the cause of diseases classified elsewhere: Secondary | ICD-10-CM | POA: Diagnosis present

## 2012-03-02 DIAGNOSIS — G35 Multiple sclerosis: Secondary | ICD-10-CM | POA: Diagnosis present

## 2012-03-02 DIAGNOSIS — E876 Hypokalemia: Secondary | ICD-10-CM | POA: Diagnosis not present

## 2012-03-02 DIAGNOSIS — C50419 Malignant neoplasm of upper-outer quadrant of unspecified female breast: Secondary | ICD-10-CM | POA: Diagnosis present

## 2012-03-02 DIAGNOSIS — E871 Hypo-osmolality and hyponatremia: Secondary | ICD-10-CM | POA: Diagnosis not present

## 2012-03-02 DIAGNOSIS — C50411 Malignant neoplasm of upper-outer quadrant of right female breast: Secondary | ICD-10-CM | POA: Diagnosis present

## 2012-03-02 DIAGNOSIS — G92 Toxic encephalopathy: Secondary | ICD-10-CM | POA: Diagnosis not present

## 2012-03-02 DIAGNOSIS — D696 Thrombocytopenia, unspecified: Secondary | ICD-10-CM | POA: Diagnosis not present

## 2012-03-02 DIAGNOSIS — Z96659 Presence of unspecified artificial knee joint: Secondary | ICD-10-CM

## 2012-03-02 DIAGNOSIS — F3289 Other specified depressive episodes: Secondary | ICD-10-CM | POA: Diagnosis not present

## 2012-03-02 DIAGNOSIS — D62 Acute posthemorrhagic anemia: Secondary | ICD-10-CM | POA: Diagnosis not present

## 2012-03-02 DIAGNOSIS — Y831 Surgical operation with implant of artificial internal device as the cause of abnormal reaction of the patient, or of later complication, without mention of misadventure at the time of the procedure: Secondary | ICD-10-CM | POA: Diagnosis present

## 2012-03-02 DIAGNOSIS — Z79899 Other long term (current) drug therapy: Secondary | ICD-10-CM

## 2012-03-02 DIAGNOSIS — G4700 Insomnia, unspecified: Secondary | ICD-10-CM | POA: Diagnosis present

## 2012-03-02 DIAGNOSIS — R7881 Bacteremia: Secondary | ICD-10-CM | POA: Diagnosis not present

## 2012-03-02 DIAGNOSIS — F329 Major depressive disorder, single episode, unspecified: Secondary | ICD-10-CM | POA: Diagnosis not present

## 2012-03-02 DIAGNOSIS — M009 Pyogenic arthritis, unspecified: Secondary | ICD-10-CM

## 2012-03-02 HISTORY — PX: I & D KNEE WITH POLY EXCHANGE: SHX5024

## 2012-03-02 LAB — CBC
HCT: 27.9 % — ABNORMAL LOW (ref 36.0–46.0)
Hemoglobin: 9.4 g/dL — ABNORMAL LOW (ref 12.0–15.0)
MCH: 27 pg (ref 26.0–34.0)
MCHC: 33.7 g/dL (ref 30.0–36.0)
MCV: 80.2 fL (ref 78.0–100.0)
Platelets: 89 10*3/uL — ABNORMAL LOW (ref 150–400)
RBC: 3.48 MIL/uL — ABNORMAL LOW (ref 3.87–5.11)
RDW: 17.2 % — ABNORMAL HIGH (ref 11.5–15.5)
WBC: 7.4 10*3/uL (ref 4.0–10.5)

## 2012-03-02 LAB — URINALYSIS, ROUTINE W REFLEX MICROSCOPIC
Glucose, UA: NEGATIVE mg/dL
Ketones, ur: NEGATIVE mg/dL
Leukocytes, UA: NEGATIVE
Nitrite: NEGATIVE
Protein, ur: 100 mg/dL — AB
Specific Gravity, Urine: 1.014 (ref 1.005–1.030)
Urobilinogen, UA: 4 mg/dL — ABNORMAL HIGH (ref 0.0–1.0)
pH: 6.5 (ref 5.0–8.0)

## 2012-03-02 LAB — URINE MICROSCOPIC-ADD ON

## 2012-03-02 LAB — COMPREHENSIVE METABOLIC PANEL
ALT: 57 U/L — ABNORMAL HIGH (ref 0–35)
AST: 45 U/L — ABNORMAL HIGH (ref 0–37)
Albumin: 3 g/dL — ABNORMAL LOW (ref 3.5–5.2)
Alkaline Phosphatase: 317 U/L — ABNORMAL HIGH (ref 39–117)
BUN: 12 mg/dL (ref 6–23)
CO2: 24 mEq/L (ref 19–32)
Calcium: 9.2 mg/dL (ref 8.4–10.5)
Chloride: 92 mEq/L — ABNORMAL LOW (ref 96–112)
Creatinine, Ser: 0.93 mg/dL (ref 0.50–1.10)
GFR calc Af Amer: 80 mL/min — ABNORMAL LOW (ref >90)
GFR calc non Af Amer: 69 mL/min — ABNORMAL LOW (ref >90)
Glucose, Bld: 143 mg/dL — ABNORMAL HIGH (ref 70–99)
Potassium: 3.6 mEq/L (ref 3.5–5.1)
Sodium: 130 mEq/L — ABNORMAL LOW (ref 135–145)
Total Bilirubin: 2 mg/dL — ABNORMAL HIGH (ref 0.3–1.2)
Total Protein: 6.9 g/dL (ref 6.0–8.3)

## 2012-03-02 LAB — GRAM STAIN

## 2012-03-02 LAB — DIFFERENTIAL
Basophils Absolute: 0 10*3/uL (ref 0.0–0.1)
Basophils Relative: 0 % (ref 0–1)
Eosinophils Absolute: 0 10*3/uL (ref 0.0–0.7)
Eosinophils Relative: 0 % (ref 0–5)
Lymphocytes Relative: 6 % — ABNORMAL LOW (ref 12–46)
Lymphs Abs: 0.5 10*3/uL — ABNORMAL LOW (ref 0.7–4.0)
Monocytes Absolute: 0.4 10*3/uL (ref 0.1–1.0)
Monocytes Relative: 5 % (ref 3–12)
Neutro Abs: 6.6 10*3/uL (ref 1.7–7.7)
Neutrophils Relative %: 89 % — ABNORMAL HIGH (ref 43–77)

## 2012-03-02 LAB — APTT: aPTT: 34 seconds (ref 24–37)

## 2012-03-02 LAB — SEDIMENTATION RATE: Sed Rate: 72 mm/hr — ABNORMAL HIGH (ref 0–22)

## 2012-03-02 LAB — HCG, SERUM, QUALITATIVE: Preg, Serum: NEGATIVE

## 2012-03-02 LAB — SURGICAL PCR SCREEN
MRSA, PCR: NEGATIVE
Staphylococcus aureus: POSITIVE — AB

## 2012-03-02 LAB — PROTIME-INR
INR: 1.07 (ref 0.00–1.49)
Prothrombin Time: 14.1 seconds (ref 11.6–15.2)

## 2012-03-02 SURGERY — IRRIGATION AND DEBRIDEMENT KNEE WITH POLY EXCHANGE
Anesthesia: General | Site: Knee | Laterality: Left | Wound class: Dirty or Infected

## 2012-03-02 MED ORDER — HYDROMORPHONE HCL PF 1 MG/ML IJ SOLN
1.0000 mg | INTRAMUSCULAR | Status: DC | PRN
  Administered 2012-03-02 – 2012-03-03 (×5): 1 mg via INTRAVENOUS
  Filled 2012-03-02 (×5): qty 1

## 2012-03-02 MED ORDER — DEXTROSE-NACL 5-0.45 % IV SOLN
INTRAVENOUS | Status: DC
  Administered 2012-03-02: 18:00:00 via INTRAVENOUS

## 2012-03-02 MED ORDER — ADULT MULTIVITAMIN W/MINERALS CH
1.0000 | ORAL_TABLET | Freq: Every day | ORAL | Status: DC
  Administered 2012-03-02 – 2012-03-08 (×6): 1 via ORAL
  Filled 2012-03-02 (×7): qty 1

## 2012-03-02 MED ORDER — MUPIROCIN 2 % EX OINT
TOPICAL_OINTMENT | CUTANEOUS | Status: AC
  Filled 2012-03-02: qty 22

## 2012-03-02 MED ORDER — LORAZEPAM 0.5 MG PO TABS
0.5000 mg | ORAL_TABLET | Freq: Three times a day (TID) | ORAL | Status: DC
  Administered 2012-03-03: 0.5 mg via ORAL
  Filled 2012-03-02: qty 1

## 2012-03-02 MED ORDER — OXYCHLOROSENE SODIUM POWD
Status: DC | PRN
  Administered 2012-03-02: 1

## 2012-03-02 MED ORDER — ACETAMINOPHEN 325 MG PO TABS
650.0000 mg | ORAL_TABLET | Freq: Four times a day (QID) | ORAL | Status: DC | PRN
  Administered 2012-03-03: 650 mg via ORAL
  Filled 2012-03-02: qty 2

## 2012-03-02 MED ORDER — OXYCODONE HCL 5 MG PO TABS
5.0000 mg | ORAL_TABLET | ORAL | Status: DC | PRN
  Administered 2012-03-02 – 2012-03-03 (×5): 10 mg via ORAL
  Filled 2012-03-02 (×6): qty 2

## 2012-03-02 MED ORDER — OXYCODONE HCL 5 MG/5ML PO SOLN: 5.0000 mg | Freq: Once | ORAL | Status: DC | PRN

## 2012-03-02 MED ORDER — ONDANSETRON HCL 4 MG/2ML IJ SOLN
INTRAMUSCULAR | Status: DC | PRN
  Administered 2012-03-02: 4 mg via INTRAVENOUS

## 2012-03-02 MED ORDER — ONDANSETRON HCL 4 MG PO TABS: 4.0000 mg | ORAL_TABLET | Freq: Four times a day (QID) | ORAL | Status: DC | PRN

## 2012-03-02 MED ORDER — SODIUM CHLORIDE 0.9 % IR SOLN
Status: DC | PRN
  Administered 2012-03-02: 17:00:00

## 2012-03-02 MED ORDER — DROPERIDOL 2.5 MG/ML IJ SOLN
INTRAMUSCULAR | Status: DC | PRN
  Administered 2012-03-02: 0.625 mg via INTRAVENOUS

## 2012-03-02 MED ORDER — LIDOCAINE HCL (CARDIAC) 20 MG/ML IV SOLN
INTRAVENOUS | Status: DC | PRN
  Administered 2012-03-02: 80 mg via INTRAVENOUS

## 2012-03-02 MED ORDER — WARFARIN SODIUM 7.5 MG PO TABS
7.5000 mg | ORAL_TABLET | Freq: Once | ORAL | Status: AC
  Administered 2012-03-02: 7.5 mg via ORAL
  Filled 2012-03-02: qty 1

## 2012-03-02 MED ORDER — PANTOPRAZOLE SODIUM 40 MG PO TBEC
80.0000 mg | DELAYED_RELEASE_TABLET | Freq: Every day | ORAL | Status: DC
  Administered 2012-03-03 – 2012-03-07 (×4): 80 mg via ORAL
  Filled 2012-03-02 (×2): qty 1
  Filled 2012-03-02: qty 2
  Filled 2012-03-02 (×2): qty 1

## 2012-03-02 MED ORDER — MUPIROCIN 2 % EX OINT
TOPICAL_OINTMENT | Freq: Once | CUTANEOUS | Status: AC
  Administered 2012-03-02: 14:00:00 via NASAL

## 2012-03-02 MED ORDER — WARFARIN VIDEO: Freq: Once | Status: DC

## 2012-03-02 MED ORDER — SODIUM CHLORIDE 0.9 % IR SOLN
Status: DC | PRN
  Administered 2012-03-02: 60000 mL

## 2012-03-02 MED ORDER — BACLOFEN 10 MG PO TABS
10.0000 mg | ORAL_TABLET | Freq: Three times a day (TID) | ORAL | Status: DC
  Administered 2012-03-02 – 2012-03-03 (×3): 10 mg via ORAL
  Filled 2012-03-02 (×7): qty 1

## 2012-03-02 MED ORDER — OXYCODONE HCL 5 MG PO TABS: 5.0000 mg | ORAL_TABLET | Freq: Once | ORAL | Status: DC | PRN

## 2012-03-02 MED ORDER — DULOXETINE HCL 60 MG PO CPEP
60.0000 mg | ORAL_CAPSULE | Freq: Every day | ORAL | Status: DC
  Administered 2012-03-02 – 2012-03-06 (×4): 60 mg via ORAL
  Filled 2012-03-02 (×5): qty 1

## 2012-03-02 MED ORDER — MUPIROCIN 2 % EX OINT
TOPICAL_OINTMENT | Freq: Once | CUTANEOUS | Status: AC
  Administered 2012-03-02: 22:00:00 via NASAL
  Filled 2012-03-02: qty 22

## 2012-03-02 MED ORDER — METHOCARBAMOL 100 MG/ML IJ SOLN
500.0000 mg | Freq: Four times a day (QID) | INTRAVENOUS | Status: DC | PRN
  Filled 2012-03-02: qty 5

## 2012-03-02 MED ORDER — ACETAMINOPHEN 650 MG RE SUPP: 650.0000 mg | Freq: Four times a day (QID) | RECTAL | Status: DC | PRN

## 2012-03-02 MED ORDER — PROPOFOL 10 MG/ML IV BOLUS
INTRAVENOUS | Status: DC | PRN
  Administered 2012-03-02: 200 mg via INTRAVENOUS

## 2012-03-02 MED ORDER — MUPIROCIN 2 % EX OINT
TOPICAL_OINTMENT | Freq: Once | CUTANEOUS | Status: DC
  Filled 2012-03-02: qty 22

## 2012-03-02 MED ORDER — LOSARTAN POTASSIUM 50 MG PO TABS
50.0000 mg | ORAL_TABLET | Freq: Every day | ORAL | Status: DC
  Administered 2012-03-03: 50 mg via ORAL
  Filled 2012-03-02 (×3): qty 1

## 2012-03-02 MED ORDER — HYDROMORPHONE HCL PF 1 MG/ML IJ SOLN
0.2500 mg | INTRAMUSCULAR | Status: DC | PRN
  Administered 2012-03-02 (×2): 0.5 mg via INTRAVENOUS

## 2012-03-02 MED ORDER — LIDOCAINE HCL 4 % MT SOLN
OROMUCOSAL | Status: DC | PRN
  Administered 2012-03-02: 4 mL via TOPICAL

## 2012-03-02 MED ORDER — LACTATED RINGERS IV SOLN
INTRAVENOUS | Status: DC
  Administered 2012-03-02 (×3): via INTRAVENOUS

## 2012-03-02 MED ORDER — CLONAZEPAM 0.5 MG PO TABS
0.5000 mg | ORAL_TABLET | Freq: Every evening | ORAL | Status: DC | PRN
  Administered 2012-03-05: 0.5 mg via ORAL
  Filled 2012-03-02: qty 1

## 2012-03-02 MED ORDER — PHENYLEPHRINE HCL 10 MG/ML IJ SOLN
INTRAMUSCULAR | Status: DC | PRN
  Administered 2012-03-02 (×5): 80 ug via INTRAVENOUS

## 2012-03-02 MED ORDER — HYDROGEN PEROXIDE 3 % EX SOLN
CUTANEOUS | Status: DC | PRN
  Administered 2012-03-02: 1

## 2012-03-02 MED ORDER — ONDANSETRON HCL 4 MG/2ML IJ SOLN
4.0000 mg | Freq: Four times a day (QID) | INTRAMUSCULAR | Status: DC | PRN
  Administered 2012-03-03 – 2012-03-08 (×8): 4 mg via INTRAVENOUS
  Filled 2012-03-02 (×7): qty 2

## 2012-03-02 MED ORDER — SODIUM CHLORIDE 0.9 % IV SOLN
1000.0000 mg | INTRAVENOUS | Status: DC | PRN
  Administered 2012-03-02: 1000 mg via INTRAVENOUS

## 2012-03-02 MED ORDER — DOCUSATE SODIUM 100 MG PO CAPS
100.0000 mg | ORAL_CAPSULE | Freq: Two times a day (BID) | ORAL | Status: DC
  Administered 2012-03-02 – 2012-03-08 (×10): 100 mg via ORAL
  Filled 2012-03-02 (×14): qty 1

## 2012-03-02 MED ORDER — COUMADIN BOOK
Freq: Once | Status: AC
  Administered 2012-03-02: 22:00:00
  Filled 2012-03-02: qty 1

## 2012-03-02 MED ORDER — SENNOSIDES-DOCUSATE SODIUM 8.6-50 MG PO TABS: 1.0000 | ORAL_TABLET | Freq: Every evening | ORAL | Status: DC | PRN

## 2012-03-02 MED ORDER — VANCOMYCIN HCL IN DEXTROSE 1-5 GM/200ML-% IV SOLN
INTRAVENOUS | Status: AC
  Filled 2012-03-02: qty 200

## 2012-03-02 MED ORDER — WARFARIN - PHARMACIST DOSING INPATIENT: Freq: Every day | Status: DC

## 2012-03-02 MED ORDER — VANCOMYCIN HCL IN DEXTROSE 1-5 GM/200ML-% IV SOLN
1000.0000 mg | Freq: Two times a day (BID) | INTRAVENOUS | Status: DC
  Administered 2012-03-03 – 2012-03-04 (×3): 1000 mg via INTRAVENOUS
  Filled 2012-03-02 (×5): qty 200

## 2012-03-02 MED ORDER — MIDAZOLAM HCL 5 MG/5ML IJ SOLN
INTRAMUSCULAR | Status: DC | PRN
  Administered 2012-03-02: 2 mg via INTRAVENOUS

## 2012-03-02 MED ORDER — ROCURONIUM BROMIDE 100 MG/10ML IV SOLN
INTRAVENOUS | Status: DC | PRN
  Administered 2012-03-02: 20 mg via INTRAVENOUS

## 2012-03-02 MED ORDER — LOSARTAN POTASSIUM-HCTZ 50-12.5 MG PO TABS: 1.0000 | ORAL_TABLET | Freq: Every day | ORAL | Status: DC

## 2012-03-02 MED ORDER — EPHEDRINE SULFATE 50 MG/ML IJ SOLN
INTRAMUSCULAR | Status: DC | PRN
  Administered 2012-03-02: 10 mg via INTRAVENOUS
  Administered 2012-03-02: 5 mg via INTRAVENOUS

## 2012-03-02 MED ORDER — HYDROCHLOROTHIAZIDE 12.5 MG PO CAPS
12.5000 mg | ORAL_CAPSULE | Freq: Every day | ORAL | Status: DC
  Administered 2012-03-03: 12.5 mg via ORAL
  Filled 2012-03-02 (×3): qty 1

## 2012-03-02 MED ORDER — SUFENTANIL CITRATE 50 MCG/ML IV SOLN
INTRAVENOUS | Status: DC | PRN
  Administered 2012-03-02 (×4): 10 ug via INTRAVENOUS

## 2012-03-02 MED ORDER — DROPERIDOL 2.5 MG/ML IJ SOLN: 0.6250 mg | INTRAMUSCULAR | Status: DC | PRN

## 2012-03-02 MED ORDER — METHOCARBAMOL 500 MG PO TABS
500.0000 mg | ORAL_TABLET | Freq: Four times a day (QID) | ORAL | Status: DC | PRN
  Administered 2012-03-02 – 2012-03-03 (×3): 500 mg via ORAL
  Filled 2012-03-02 (×4): qty 1

## 2012-03-02 MED ORDER — ALUM & MAG HYDROXIDE-SIMETH 200-200-20 MG/5ML PO SUSP: 30.0000 mL | ORAL | Status: DC | PRN

## 2012-03-02 MED ORDER — GABAPENTIN 600 MG PO TABS
600.0000 mg | ORAL_TABLET | Freq: Three times a day (TID) | ORAL | Status: DC
  Administered 2012-03-02 – 2012-03-03 (×3): 600 mg via ORAL
  Filled 2012-03-02 (×7): qty 1

## 2012-03-02 SURGICAL SUPPLY — 55 items
BAG DECANTER FOR FLEXI CONT (MISCELLANEOUS) IMPLANT
BANDAGE ELASTIC 4 VELCRO ST LF (GAUZE/BANDAGES/DRESSINGS) ×2 IMPLANT
BANDAGE ELASTIC 6 VELCRO ST LF (GAUZE/BANDAGES/DRESSINGS) ×2 IMPLANT
BANDAGE ESMARK 6X9 LF (GAUZE/BANDAGES/DRESSINGS) IMPLANT
BANDAGE GAUZE ELAST BULKY 4 IN (GAUZE/BANDAGES/DRESSINGS) ×1 IMPLANT
BNDG CMPR 9X6 STRL LF SNTH (GAUZE/BANDAGES/DRESSINGS)
BNDG COHESIVE 4X5 TAN STRL (GAUZE/BANDAGES/DRESSINGS) ×2 IMPLANT
BNDG ESMARK 6X9 LF (GAUZE/BANDAGES/DRESSINGS)
CLOTH BEACON ORANGE TIMEOUT ST (SAFETY) ×2 IMPLANT
CUFF TOURNIQUET SINGLE 18IN (TOURNIQUET CUFF) ×2 IMPLANT
CUFF TOURNIQUET SINGLE 24IN (TOURNIQUET CUFF) IMPLANT
CUFF TOURNIQUET SINGLE 34IN LL (TOURNIQUET CUFF) ×1 IMPLANT
CUFF TOURNIQUET SINGLE 44IN (TOURNIQUET CUFF) IMPLANT
DRAPE U-SHAPE 47X51 STRL (DRAPES) ×2 IMPLANT
DRSG ADAPTIC 3X8 NADH LF (GAUZE/BANDAGES/DRESSINGS) ×1 IMPLANT
DRSG EMULSION OIL 3X3 NADH (GAUZE/BANDAGES/DRESSINGS) ×1 IMPLANT
DRSG PAD ABDOMINAL 8X10 ST (GAUZE/BANDAGES/DRESSINGS) ×2 IMPLANT
DURAPREP 26ML APPLICATOR (WOUND CARE) ×2 IMPLANT
ELECT CAUTERY BLADE 6.4 (BLADE) IMPLANT
ELECT REM PT RETURN 9FT ADLT (ELECTROSURGICAL) ×2
ELECTRODE REM PT RTRN 9FT ADLT (ELECTROSURGICAL) IMPLANT
EVACUATOR 1/8 PVC DRAIN (DRAIN) ×1 IMPLANT
GAUZE XEROFORM 1X8 LF (GAUZE/BANDAGES/DRESSINGS) ×1 IMPLANT
GLOVE BIOGEL PI IND STRL 8 (GLOVE) ×2 IMPLANT
GLOVE BIOGEL PI INDICATOR 8 (GLOVE) ×3
GLOVE ECLIPSE 7.5 STRL STRAW (GLOVE) ×4 IMPLANT
GOWN PREVENTION PLUS XLARGE (GOWN DISPOSABLE) ×2 IMPLANT
GOWN SRG XL XLNG 56XLVL 4 (GOWN DISPOSABLE) ×1 IMPLANT
GOWN STRL NON-REIN LRG LVL3 (GOWN DISPOSABLE) ×4 IMPLANT
GOWN STRL NON-REIN XL XLG LVL4 (GOWN DISPOSABLE) ×2
HANDPIECE INTERPULSE COAX TIP (DISPOSABLE) ×2
INSERT TIBIAL PFC SIG SZ3 10MM (Knees) ×1 IMPLANT
KIT BASIN OR (CUSTOM PROCEDURE TRAY) ×2 IMPLANT
KIT ROOM TURNOVER OR (KITS) ×2 IMPLANT
MANIFOLD NEPTUNE II (INSTRUMENTS) ×2 IMPLANT
NS IRRIG 1000ML POUR BTL (IV SOLUTION) ×2 IMPLANT
PACK ORTHO EXTREMITY (CUSTOM PROCEDURE TRAY) ×1 IMPLANT
PACK TOTAL JOINT (CUSTOM PROCEDURE TRAY) ×1 IMPLANT
PAD ARMBOARD 7.5X6 YLW CONV (MISCELLANEOUS) ×4 IMPLANT
PAD CAST 4YDX4 CTTN HI CHSV (CAST SUPPLIES) ×1 IMPLANT
PADDING CAST COTTON 4X4 STRL (CAST SUPPLIES) ×2
SET HNDPC FAN SPRY TIP SCT (DISPOSABLE) IMPLANT
SPONGE GAUZE 4X4 12PLY (GAUZE/BANDAGES/DRESSINGS) ×2 IMPLANT
SPONGE LAP 18X18 X RAY DECT (DISPOSABLE) ×1 IMPLANT
STOCKINETTE IMPERVIOUS 9X36 MD (GAUZE/BANDAGES/DRESSINGS) ×1 IMPLANT
SUT PDS AB 1 CT  36 (SUTURE) ×2
SUT PDS AB 1 CT 36 (SUTURE) IMPLANT
SUT PDS AB 2-0 CT1 27 (SUTURE) ×2 IMPLANT
TOWEL OR 17X24 6PK STRL BLUE (TOWEL DISPOSABLE) ×2 IMPLANT
TOWEL OR 17X26 10 PK STRL BLUE (TOWEL DISPOSABLE) ×2 IMPLANT
TUBE ANAEROBIC SPECIMEN COL (MISCELLANEOUS) ×1 IMPLANT
TUBE CONNECTING 12X1/4 (SUCTIONS) ×2 IMPLANT
UNDERPAD 30X30 INCONTINENT (UNDERPADS AND DIAPERS) ×1 IMPLANT
WATER STERILE IRR 1000ML POUR (IV SOLUTION) ×2 IMPLANT
YANKAUER SUCT BULB TIP NO VENT (SUCTIONS) ×2 IMPLANT

## 2012-03-02 NOTE — Anesthesia Preprocedure Evaluation (Signed)
Anesthesia Evaluation  Patient identified by MRN, date of birth, ID band Patient awake    Reviewed: Allergy & Precautions, H&P , NPO status , Patient's Chart, lab work & pertinent test results  History of Anesthesia Complications Negative for: history of anesthetic complications  Airway Mallampati: II TM Distance: >3 FB Neck ROM: Full    Dental  (+) Teeth Intact and Dental Advisory Given   Pulmonary sleep apnea ,  breath sounds clear to auscultation  Pulmonary exam normal       Cardiovascular hypertension, Pt. on medications Rhythm:Regular Rate:Normal     Neuro/Psych PSYCHIATRIC DISORDERS Depression MS stable  Neuromuscular disease    GI/Hepatic negative GI ROS, Neg liver ROS,   Endo/Other    Renal/GU      Musculoskeletal   Abdominal   Peds  Hematology   Anesthesia Other Findings   Reproductive/Obstetrics                           Anesthesia Physical Anesthesia Plan  ASA: III  Anesthesia Plan: General   Post-op Pain Management:    Induction: Intravenous  Airway Management Planned: LMA  Additional Equipment:   Intra-op Plan:   Post-operative Plan: Extubation in OR  Informed Consent: I have reviewed the patients History and Physical, chart, labs and discussed the procedure including the risks, benefits and alternatives for the proposed anesthesia with the patient or authorized representative who has indicated his/her understanding and acceptance.   Dental advisory given  Plan Discussed with: CRNA, Anesthesiologist and Surgeon  Anesthesia Plan Comments:         Anesthesia Quick Evaluation

## 2012-03-02 NOTE — Anesthesia Postprocedure Evaluation (Signed)
  Anesthesia Post-op Note  Patient: Julie Ross  Procedure(s) Performed: Procedure(s) (LRB) with comments: IRRIGATION AND DEBRIDEMENT KNEE WITH POLY EXCHANGE (Left) - I&D of total knee with Possible Poly Exchange   Patient Location: PACU  Anesthesia Type: General  Level of Consciousness: awake, alert  and oriented  Airway and Oxygen Therapy: Patient Spontanous Breathing and Patient connected to nasal cannula oxygen  Post-op Pain: mild  Post-op Assessment: Post-op Vital signs reviewed and Patient's Cardiovascular Status Stable  Post-op Vital Signs: stable  Complications: No apparent anesthesia complications

## 2012-03-02 NOTE — Consult Note (Addendum)
INFECTIOUS DISEASE CONSULT NOTE  Date of Admission:  03/02/2012  Date of Consult:  03/02/2012  Reason for Consult: Septic Arthritis, Osteomyelitis Referring Physician: Graves  Impression/Recommendation Septic Arthritis/Osteomyelitis L TKR Breast Cancer (L) T1 N1 (stage II)  Previous mastectomy  Invasive Ductal CA  ER+/HER-2/neu+  + LN MS  Would Start pt on vancomycin  Hold rifampin (she has mild elevation of her AST and ALT) Await cx Check ESR and CRP Comment- not clear source for her infected knee, possible that her immunosuppression from her CTX has played into this. Currently her counts are good. She denies drug allergies, watch her tolerance of vanco . Dr Orvan Falconer will f/u this weekend.  Thank you so much for this interesting consult,   Julie Ross [phone]  [name] is an 51 y.o. female.  HPI: 51 yo F with hx of MS since 1992, breast CA dx June 2013 (on chemo- taxotrene/carboplatinum/herceptin, last dose 2 weeks ago, she is not sure of her counts). Had L TKR Feb 2013. Has done well until Tuesday (9-10) of this week when she began to develop increasing pain and swelling in her knee. She denies hx of trauma. She denies any draining wounds. She underwent aspirate showing GPC, 17K WBC.  She underwent I & D and poly-exchange today.   Past Medical History  Diagnosis Date  . Hypertension   . Hypothyroidism   . Arthritis   . Neuromuscular disorder     MS TX WITH CYMBALTA   . Multiple sclerosis   . H/O colonoscopy   . H/O bone density study 03/2011  . Wears glasses   . Heart burn   . Depression   . Sleep apnea     MILD SLEEP APNEA, NO MACHINE  6 YRS AGO [facility]   . Breast cancer     lt breast    Past Surgical History  Procedure Date  . Tonsillectomy   . Tumor removal     FROM PELVIS AGE 10  . Cesarean section     X2   . No past surgeries     BLADDER SLING  4-5 YRS AGO   . Knee arthroscopy     LT KNEE 04/2011  . Total knee arthroplasty  08/15/2011    Procedure: TOTAL KNEE ARTHROPLASTY; lft Surgeon: Harvie Junior, MD;  Location: [facility] OR;  Service: Orthopedics;  Laterality: Left;  RIGHT KNEE CORTIZONE INJECTION  . Mohs surgery   . Knee arthroscopy 82    rt  . Joint replacement Feb 2013  . Mastectomy w/ sentinel node biopsy 12/19/2011    Procedure: MASTECTOMY WITH SENTINEL LYMPH NODE BIOPSY;  Surgeon: Currie Paris, MD;  Location: [facility] OR;  Service: General;  Laterality: Left;  left breast and left axilla   . Portacath placement 01/16/2012    Procedure: INSERTION PORT-A-CATH;  Surgeon: Currie Paris, MD;  Location: [facility];  Service: General;  Laterality: Right;  Porta Cath Placement      No Known Allergies  Medications:  Scheduled:   . mupirocin ointment   Nasal Once  . DISCONTD: mupirocin ointment   Nasal Once    Total days of antibiotics 0  Social History:  reports that she has never smoked. She has never used smokeless tobacco. She reports that she does not drink alcohol or use illicit drugs.  Family History  Problem Relation Age of Onset  . Breast cancer Paternal Aunt   . Breast cancer Maternal Grandmother   . Heart disease  Maternal Grandfather     General ROS: no dysphagia, no SOB, normal BM, normal urination, she has had no difficulty with her port, denies F/C, she denies drug allergies. See HPI.   Blood pressure 137/67, pulse 112, temperature 98.1 F (36.7 C), temperature source Oral, resp. rate 16, height 5\' 6"  (1.676 m), weight 88.451 kg (195 lb), last menstrual period 12/05/2011, SpO2 95.00%. General appearance: cooperative, fatigued and no distress Eyes: negative findings: conjunctivae and sclerae normal and pupils equal, round, reactive to light and accomodation Throat: normal findings: oropharynx pink & moist without lesions or evidence of thrush Neck: no adenopathy Lungs: clear to auscultation bilaterally Breasts: there is a subQ port in the R upper breast. non-tender, no  fluctuance, no increase in warmth. Heart: regular rate and rhythm Abdomen: normal findings: bowel sounds normal and soft, non-tender Extremities: edema none Neurologic: Sensory: normal, normal light touch LE grossly   Results for orders placed during the hospital encounter of 03/02/12 (from the past 48 hour(s))  SURGICAL PCR SCREEN     Status: Abnormal   Collection Time   03/02/12  1:35 PM      Component Value Range Comment   MRSA, PCR NEGATIVE  NEGATIVE    Staphylococcus aureus POSITIVE (*) NEGATIVE   URINALYSIS, ROUTINE W REFLEX MICROSCOPIC     Status: Abnormal   Collection Time   03/02/12  1:59 PM      Component Value Range Comment   Color, Urine AMBER (*) YELLOW BIOCHEMICALS MAY BE AFFECTED BY COLOR   APPearance CLOUDY (*) CLEAR    Specific Gravity, Urine 1.014  1.005 - 1.030    pH 6.5  5.0 - 8.0    Glucose, UA NEGATIVE  NEGATIVE mg/dL    Hgb urine dipstick TRACE (*) NEGATIVE    Bilirubin Urine SMALL (*) NEGATIVE    Ketones, ur NEGATIVE  NEGATIVE mg/dL    Protein, ur 161 (*) NEGATIVE mg/dL    Urobilinogen, UA 4.0 (*) 0.0 - 1.0 mg/dL    Nitrite NEGATIVE  NEGATIVE    Leukocytes, UA NEGATIVE  NEGATIVE   URINE MICROSCOPIC-ADD ON     Status: Abnormal   Collection Time   03/02/12  1:59 PM      Component Value Range Comment   Squamous Epithelial / LPF RARE  RARE    WBC, UA 0-2  <3 WBC/hpf    RBC / HPF 0-2  <3 RBC/hpf    Bacteria, UA RARE  RARE    Casts GRANULAR CAST (*) NEGATIVE   COMPREHENSIVE METABOLIC PANEL     Status: Abnormal   Collection Time   03/02/12  2:02 PM      Component Value Range Comment   Sodium 130 (*) 135 - 145 mEq/L    Potassium 3.6  3.5 - 5.1 mEq/L    Chloride 92 (*) 96 - 112 mEq/L    CO2 24  19 - 32 mEq/L    Glucose, Bld 143 (*) 70 - 99 mg/dL    BUN 12  6 - 23 mg/dL    Creatinine, Ser 0.96  0.50 - 1.10 mg/dL    Calcium 9.2  8.4 - 04.5 mg/dL    Total Protein 6.9  6.0 - 8.3 g/dL    Albumin 3.0 (*) 3.5 - 5.2 g/dL    AST 45 (*) 0 - 37 U/L    ALT 57 (*)  0 - 35 U/L    Alkaline Phosphatase 317 (*) 39 - 117 U/L    Total Bilirubin 2.0 (*)  0.3 - 1.2 mg/dL    GFR calc non Af Amer 69 (*) >90 mL/min    GFR calc Af Amer 80 (*) >90 mL/min   HCG, SERUM, QUALITATIVE     Status: Normal   Collection Time   03/02/12  2:03 PM      Component Value Range Comment   Preg, Serum NEGATIVE  NEGATIVE   CBC     Status: Abnormal   Collection Time   03/02/12  2:03 PM      Component Value Range Comment   WBC 7.4  4.0 - 10.5 K/uL    RBC 3.48 (*) 3.87 - 5.11 MIL/uL    Hemoglobin 9.4 (*) 12.0 - 15.0 g/dL    HCT 47.8 (*) 29.5 - 46.0 %    MCV 80.2  78.0 - 100.0 fL    MCH 27.0  26.0 - 34.0 pg    MCHC 33.7  30.0 - 36.0 g/dL    RDW 62.1 (*) 30.8 - 15.5 %    Platelets 89 (*) 150 - 400 K/uL PLATELET COUNT CONFIRMED BY SMEAR  DIFFERENTIAL     Status: Abnormal   Collection Time   03/02/12  2:03 PM      Component Value Range Comment   Neutrophils Relative 89 (*) 43 - 77 %    Neutro Abs 6.6  1.7 - 7.7 K/uL    Lymphocytes Relative 6 (*) 12 - 46 %    Lymphs Abs 0.5 (*) 0.7 - 4.0 K/uL    Monocytes Relative 5  3 - 12 %    Monocytes Absolute 0.4  0.1 - 1.0 K/uL    Eosinophils Relative 0  0 - 5 %    Eosinophils Absolute 0.0  0.0 - 0.7 K/uL    Basophils Relative 0  0 - 1 %    Basophils Absolute 0.0  0.0 - 0.1 K/uL   PROTIME-INR     Status: Normal   Collection Time   03/02/12  2:03 PM      Component Value Range Comment   Prothrombin Time 14.1  11.6 - 15.2 seconds    INR 1.07  0.00 - 1.49   APTT     Status: Normal   Collection Time   03/02/12  2:03 PM      Component Value Range Comment   aPTT 34  24 - 37 seconds   TYPE AND SCREEN     Status: Normal   Collection Time   03/02/12  2:05 PM      Component Value Range Comment   ABO/RH(D) O POS      Antibody Screen NEG      Sample Expiration 03/05/2012     GRAM STAIN     Status: Normal   Collection Time   03/02/12  4:20 PM      Component Value Range Comment   Specimen Description SYNOVIAL FLUID KNEE LEFT      Special  Requests NONE      Gram Stain        Value: MODERATE WBC PRESENT, PREDOMINANTLY PMN     RARE GRAM POSITIVE COCCI IN PAIRS     CALLED TO DR. Luiz Blare 9.13.13 AT 1652 BY ROMEROJ   Report Status 03/02/2012 FINAL         Component Value Date/Time   SDES SYNOVIAL FLUID KNEE LEFT 03/02/2012 1620   SPECREQUEST NONE 03/02/2012 1620   REPTSTATUS 03/02/2012 FINAL 03/02/2012 1620   No results found. Recent Results (from the past 240 hour(s))  SURGICAL PCR SCREEN     Status: Abnormal   Collection Time   03/02/12  1:35 PM      Component Value Range Status Comment   MRSA, PCR NEGATIVE  NEGATIVE Final    Staphylococcus aureus POSITIVE (*) NEGATIVE Final   GRAM STAIN     Status: Normal   Collection Time   03/02/12  4:20 PM      Component Value Range Status Comment   Specimen Description SYNOVIAL FLUID KNEE LEFT   Final    Special Requests NONE   Final    Gram Stain     Final    Value: MODERATE WBC PRESENT, PREDOMINANTLY PMN     RARE GRAM POSITIVE COCCI IN PAIRS     CALLED TO DR. Luiz Blare 9.13.13 AT 1652 BY ROMEROJ   Report Status 03/02/2012 FINAL   Final       03/02/2012, 7:06 PM     LOS: 0 days

## 2012-03-02 NOTE — Progress Notes (Signed)
ANTICOAGULATION CONSULT NOTE - Initial Consult  Pharmacy Consult for Coumadin Indication: VTE prophylaxis  No Known Allergies  Patient Measurements: Height: 5\' 6"  (167.6 cm) Weight: 195 lb (88.451 kg) IBW/kg (Calculated) : 59.3  Heparin Dosing Weight:   Vital Signs: Temp: 98.8 F (37.1 C) (09/13 2011) Temp src: Oral (09/13 2011) BP: 114/54 mmHg (09/13 2011) Pulse Rate: 100  (09/13 2011)  Labs:  Basename 03/02/12 1403 03/02/12 1402  HGB 9.4* --  HCT 27.9* --  PLT 89* --  APTT 34 --  LABPROT 14.1 --  INR 1.07 --  HEPARINUNFRC -- --  CREATININE -- 0.93  CKTOTAL -- --  CKMB -- --  TROPONINI -- --    Estimated Creatinine Clearance: 78.4 ml/min (by C-G formula based on Cr of 0.93).   Medical History: Past Medical History  Diagnosis Date  . Hypertension   . Hypothyroidism   . Arthritis   . Neuromuscular disorder     MS TX WITH CYMBALTA   . Multiple sclerosis   . H/O colonoscopy   . H/O bone density study 03/2011  . Wears glasses   . Heart burn   . Depression   . Sleep apnea     MILD SLEEP APNEA, NO MACHINE  6 YRS AGO [facility]   . Breast cancer     lt breast    Medications:  Scheduled:    . baclofen  10 mg Oral TID  . docusate sodium  100 mg Oral BID  . DULoxetine  60 mg Oral Daily  . gabapentin  600 mg Oral TID  . hydrochlorothiazide  12.5 mg Oral Daily  . LORazepam  0.5 mg Oral Q8H  . losartan  50 mg Oral Daily  . multivitamin with minerals  1 tablet Oral Daily  . mupirocin ointment   Nasal Once  . mupirocin ointment   Nasal Once  . pantoprazole  80 mg Oral Q1200  . vancomycin  1,000 mg Intravenous Q12H  . DISCONTD: losartan-hydrochlorothiazide  1 tablet Oral Daily  . DISCONTD: mupirocin ointment   Nasal Once    Assessment: 51yo female, s/p I&D of knee for septic arthritis, to start Coumadin for VTE prophylaxis.  LFTs are mildly elevated, baseline INR 1.07.  No bleeding problems noted.  Goal of Therapy:  INR = 2 Monitor platelets by  anticoagulation protocol: Yes   Plan:  1.  Coumadin 7.5mg  x 1 2.  Daily INR 3.  Coumadin book & video  [name], PharmD Clinical Pharmacist [facility]

## 2012-03-02 NOTE — Progress Notes (Signed)
Call to dr. Noreene Larsson to ask for a sign out for this patient.

## 2012-03-02 NOTE — H&P (Signed)
PREOPERATIVE H&P  Chief Complaint: severe left pain s/p tkr  HPI: Julie Ross is a 51 y.o. female who presents for evaluation of l kne pain. It has been present for 35hrs and has been worsening. She underwent tkr in feb and was subsequently diagnosed with breast cancer and has been on chemo.  Pt doing well until yesterday and presented with an acutely swollen and painful knee.  Aspirate showed 117k WBC's and Gm stain + for GM+ cocci.  She is admitted for I@D  and IV abx.. Pain is rated as severe.  Past Medical History  Diagnosis Date  . Hypertension   . Hypothyroidism   . Arthritis   . Neuromuscular disorder     MS TX WITH CYMBALTA   . Multiple sclerosis   . H/O colonoscopy   . H/O bone density study 03/2011  . Wears glasses   . Heart burn   . Depression   . Sleep apnea     MILD SLEEP APNEA, NO MACHINE  6 YRS AGO [facility]   . Breast cancer     lt breast   Past Surgical History  Procedure Date  . Tonsillectomy   . Tumor removal     FROM PELVIS AGE 71  . Cesarean section     X2   . No past surgeries     BLADDER SLING  4-5 YRS AGO   . Knee arthroscopy     LT KNEE 04/2011  . Total knee arthroplasty 08/15/2011    Procedure: TOTAL KNEE ARTHROPLASTY; lft Surgeon: Harvie Junior, MD;  Location: [facility] OR;  Service: Orthopedics;  Laterality: Left;  RIGHT KNEE CORTIZONE INJECTION  . Mohs surgery   . Knee arthroscopy 82    rt  . Joint replacement Feb 2013  . Mastectomy w/ sentinel node biopsy 12/19/2011    Procedure: MASTECTOMY WITH SENTINEL LYMPH NODE BIOPSY;  Surgeon: Currie Paris, MD;  Location: [facility] OR;  Service: General;  Laterality: Left;  left breast and left axilla   . Portacath placement 01/16/2012    Procedure: INSERTION PORT-A-CATH;  Surgeon: Currie Paris, MD;  Location: [facility];  Service: General;  Laterality: Right;  Porta Cath Placement    History   Social History  . Marital Status: Married    Spouse Name: N/A    Number of Children: N/A    . Years of Education: N/A   Social History Main Topics  . Smoking status: Never Smoker   . Smokeless tobacco: Never Used  . Alcohol Use: No  . Drug Use: No  . Sexually Active: Yes   Other Topics Concern  . None   Social History Narrative  . None   Family History  Problem Relation Age of Onset  . Breast cancer Paternal Aunt   . Breast cancer Maternal Grandmother   . Heart disease Maternal Grandfather    No Known Allergies Prior to Admission medications   Medication Sig Start Date End Date Taking? Authorizing Provider  baclofen (LIORESAL) 10 MG tablet Take 10 mg by mouth 3 (three) times daily.   Yes Historical Provider, MD  CALCIUM-MAGNESIUM PO Take 1 tablet by mouth daily.   Yes Historical Provider, MD  clonazePAM (KLONOPIN) 0.5 MG tablet Take 0.5 mg by mouth at bedtime as needed. For sleep   Yes Historical Provider, MD  dexamethasone (DECADRON) 4 MG tablet Take 2 tablets two times a day the day before Taxotere. Then take 2 tabs two times a day starting the day  after chemo for 3 days. 12/30/11 12/29/12 Yes Victorino December, MD  DULoxetine (CYMBALTA) 60 MG capsule Take 60 mg by mouth daily.   Yes Historical Provider, MD  esomeprazole (NEXIUM) 40 MG capsule Take 40 mg by mouth daily before breakfast.   Yes Historical Provider, MD  gabapentin (NEURONTIN) 600 MG tablet Take 600 mg by mouth 3 (three) times daily.   Yes Historical Provider, MD  HYDROcodone-acetaminophen (NORCO) 10-325 MG per tablet Take 1 tablet by mouth every 8 (eight) hours as needed. As needed for pain. 12/15/11  Yes Historical Provider, MD  lidocaine-prilocaine (EMLA) cream Apply 1 application topically as needed. 01/18/12 01/17/13 Yes Victorino December, MD  LORazepam (ATIVAN) 0.5 MG tablet Take 0.5 mg by mouth every 8 (eight) hours. As needed for anxiety.   Yes Historical Provider, MD  losartan-hydrochlorothiazide (HYZAAR) 50-12.5 MG per tablet Take 1 tablet by mouth daily.   Yes Historical Provider, MD  Multiple  Vitamins-Minerals (MULTIVITAMINS THER. W/MINERALS) TABS Take 1 tablet by mouth daily.   Yes Historical Provider, MD  ondansetron (ZOFRAN) 8 MG tablet Take 1 tablet two times a day starting the day after chemo for 3 days. Then take 1 tab two times a day as needed for nausea or vomiting. 12/30/11 12/29/12 Yes Victorino December, MD  prochlorperazine (COMPAZINE) 10 MG tablet Take 1 tablet (10 mg total) by mouth every 6 (six) hours as needed (Nausea or vomiting). 12/30/11 12/29/12 Yes Victorino December, MD  prochlorperazine (COMPAZINE) 25 MG suppository Place 1 suppository (25 mg total) rectally every 12 (twelve) hours as needed for nausea. 12/30/11 12/29/12 Yes Victorino December, MD  naproxen sodium (ANAPROX) 550 MG tablet Take 550 mg by mouth as needed.    Historical Provider, MD  PRESCRIPTION MEDICATION Chemotherapy regimen. Taxotere.    Historical Provider, MD  UNABLE TO FIND Cranial Prothesis 01/18/12   Victorino December, MD     Positive ROS: none  All other systems have been reviewed and were otherwise negative with the exception of those mentioned in the HPI and as above.  Physical Exam: Filed Vitals:   03/02/12 1343  BP: 155/78  Pulse: 114  Temp: 101.3 F (38.5 C)  Resp: 18    General: Alert, no acute distress Cardiovascular: No pedal edema Respiratory: No cyanosis, no use of accessory musculature GI: No organomegaly, abdomen is soft and non-tender Skin: No lesions in the area of chief complaint Neurologic: Sensation intact distally Psychiatric: Patient is competent for consent with normal mood and affect Lymphatic: No axillary or cervical lymphadenopathy  MUSCULOSKELETAL: l knee red hot and swollen + painful rom.  Assessment/Plan: infected total knee left Plan for Procedure(s): IRRIGATION AND DEBRIDEMENT KNEE WITH POLY EXCHANGE and other as needed  The risks benefits and alternatives were discussed with the patient including but not limited to the risks of nonoperative treatment, versus  surgical intervention including infection, bleeding, nerve injury, malunion, nonunion, hardware prominence, hardware failure, need for hardware removal, blood clots, cardiopulmonary complications, morbidity, mortality, among others, and they were willing to proceed.  Predicted outcome is good, although there will be at least a six to nine month expected recovery.  Harvie Junior, MD 03/02/2012 3:33 PM

## 2012-03-02 NOTE — Transfer of Care (Signed)
Immediate Anesthesia Transfer of Care Note  Patient: Julie Ross  Procedure(s) Performed: Procedure(s) (LRB) with comments: IRRIGATION AND DEBRIDEMENT KNEE WITH POLY EXCHANGE (Left) - I&D of total knee with Possible Poly Exchange   Patient Location: PACU  Anesthesia Type: General  Level of Consciousness: awake and alert   Airway & Oxygen Therapy: Patient Spontanous Breathing and Patient connected to face mask oxygen  Post-op Assessment: Report given to PACU RN and Post -op Vital signs reviewed and stable  Post vital signs: Reviewed and stable  Complications: No apparent anesthesia complications

## 2012-03-02 NOTE — Progress Notes (Signed)
ANTIBIOTIC CONSULT NOTE - INITIAL  Pharmacy Consult for Vancomycin Indication: osteoarthritis, septic arthritis  No Known Allergies  Patient Measurements: Height: 5\' 6"  (167.6 cm) Weight: 195 lb (88.451 kg) IBW/kg (Calculated) : 59.3  Adjusted Body Weight:   Vital Signs: Temp: 98.1 F (36.7 C) (09/13 1745) Temp src: Oral (09/13 1343) BP: 137/67 mmHg (09/13 1830) Pulse Rate: 112  (09/13 1745) Intake/Output from previous day:   Intake/Output from this shift:    Labs:  Basename 03/02/12 1403 03/02/12 1402  WBC 7.4 --  HGB 9.4* --  PLT 89* --  LABCREA -- --  CREATININE -- 0.93   Estimated Creatinine Clearance: 78.4 ml/min (by C-G formula based on Cr of 0.93). No results found for this basename: VANCOTROUGH:2,VANCOPEAK:2,VANCORANDOM:2,GENTTROUGH:2,GENTPEAK:2,GENTRANDOM:2,TOBRATROUGH:2,TOBRAPEAK:2,TOBRARND:2,AMIKACINPEAK:2,AMIKACINTROU:2,AMIKACIN:2, in the last 72 hours   Microbiology: Recent Results (from the past 720 hour(s))  SURGICAL PCR SCREEN     Status: Abnormal   Collection Time   03/02/12  1:35 PM      Component Value Range Status Comment   MRSA, PCR NEGATIVE  NEGATIVE Final    Staphylococcus aureus POSITIVE (*) NEGATIVE Final   GRAM STAIN     Status: Normal   Collection Time   03/02/12  4:20 PM      Component Value Range Status Comment   Specimen Description SYNOVIAL FLUID KNEE LEFT   Final    Special Requests NONE   Final    Gram Stain     Final    Value: MODERATE WBC PRESENT, PREDOMINANTLY PMN     RARE GRAM POSITIVE COCCI IN PAIRS     CALLED TO DR. Luiz Blare 9.13.13 AT 1652 BY ROMEROJ   Report Status 03/02/2012 FINAL   Final     Medical History: Past Medical History  Diagnosis Date  . Hypertension   . Hypothyroidism   . Arthritis   . Neuromuscular disorder     MS TX WITH CYMBALTA   . Multiple sclerosis   . H/O colonoscopy   . H/O bone density study 03/2011  . Wears glasses   . Heart burn   . Depression   . Sleep apnea     MILD SLEEP APNEA, NO  MACHINE  6 YRS AGO [facility]   . Breast cancer     lt breast    Medications:  Scheduled:    . mupirocin ointment   Nasal Once  . DISCONTD: mupirocin ointment   Nasal Once   Assessment: 51yo female with septic arthritis, osteoarthritis s/p I&D with gram stain of synovial fluid (+)GPC-pairs.  She received Vancomycin 1000mg  IV in the OR, ~1615.  Pt also has breast cancer is currently undergoing chemotherapy and was to have received a dose today.    Goal of Therapy:  Vancomycin trough level 15-20 mcg/ml  Plan:  1.  Vancomycin 1000mg  IV q12, next dose due 0400 2.  F/U blood and fluid cultures 3.  Steady state trough when appropriate  [name], PharmD Clinical Pharmacist [facility]

## 2012-03-02 NOTE — Progress Notes (Signed)
Orthopedic Tech Progress Note Patient Details:  Julie Ross Jul 03, 1960 [id number] Overhead frame applied to patients bed.     Jennye Moccasin 03/02/2012, 9:35 PM

## 2012-03-02 NOTE — Preoperative (Signed)
Beta Blockers   Reason not to administer Beta Blockers:Not Applicable 

## 2012-03-02 NOTE — Brief Op Note (Signed)
03/02/2012  5:24 PM  PATIENT:  Julie Ross  51 y.o. female  PRE-OPERATIVE DIAGNOSIS:  infected total knee left  POST-OPERATIVE DIAGNOSIS:  infected left total knee  PROCEDURE:  Procedure(s) (LRB) with comments: IRRIGATION AND DEBRIDEMENT KNEE WITH POLY EXCHANGE (Left) - I&D of total knee with Possible Poly Exchange   SURGEON:  Surgeon(s) and Role:    * Harvie Junior, MD - Primary  PHYSICIAN ASSISTANT:   ASSISTANTS: bethune   ANESTHESIA:   general  EBL:  Total I/O In: 1750 [I.V.:1500; IV Piggyback:250] Out: 350 [Urine:300; Blood:50]  BLOOD ADMINISTERED:none  DRAINS: (2) Hemovact drain(s) in the l. knee with  Suction Open   LOCAL MEDICATIONS USED:  MARCAINE     SPECIMEN:  No Specimen  DISPOSITION OF SPECIMEN:  N/A  COUNTS:  YES  TOURNIQUET:   Total Tourniquet Time Documented: Thigh (Left) - 36 minutes  DICTATION: .Other Dictation: Dictation Number [id number]  PLAN OF CARE: Admit to inpatient   PATIENT DISPOSITION:  PACU - hemodynamically stable.   Delay start of Pharmacological VTE agent (>24hrs) due to surgical blood loss or risk of bleeding: no

## 2012-03-03 ENCOUNTER — Ambulatory Visit: Payer: BC Managed Care – PPO

## 2012-03-03 DIAGNOSIS — T8450XA Infection and inflammatory reaction due to unspecified internal joint prosthesis, initial encounter: Secondary | ICD-10-CM

## 2012-03-03 DIAGNOSIS — Y831 Surgical operation with implant of artificial internal device as the cause of abnormal reaction of the patient, or of later complication, without mention of misadventure at the time of the procedure: Secondary | ICD-10-CM

## 2012-03-03 LAB — BASIC METABOLIC PANEL
BUN: 13 mg/dL (ref 6–23)
CO2: 23 mEq/L (ref 19–32)
Calcium: 8.5 mg/dL (ref 8.4–10.5)
Chloride: 88 mEq/L — ABNORMAL LOW (ref 96–112)
Creatinine, Ser: 1.18 mg/dL — ABNORMAL HIGH (ref 0.50–1.10)
GFR calc Af Amer: 60 mL/min — ABNORMAL LOW (ref >90)
GFR calc non Af Amer: 52 mL/min — ABNORMAL LOW (ref >90)
Glucose, Bld: 158 mg/dL — ABNORMAL HIGH (ref 70–99)
Potassium: 3.2 mEq/L — ABNORMAL LOW (ref 3.5–5.1)
Sodium: 123 mEq/L — ABNORMAL LOW (ref 135–145)

## 2012-03-03 LAB — CBC
HCT: 22.4 % — ABNORMAL LOW (ref 36.0–46.0)
Hemoglobin: 7.5 g/dL — ABNORMAL LOW (ref 12.0–15.0)
MCH: 26.7 pg (ref 26.0–34.0)
MCHC: 33.5 g/dL (ref 30.0–36.0)
MCV: 79.7 fL (ref 78.0–100.0)
Platelets: 89 10*3/uL — ABNORMAL LOW (ref 150–400)
RBC: 2.81 MIL/uL — ABNORMAL LOW (ref 3.87–5.11)
RDW: 17.5 % — ABNORMAL HIGH (ref 11.5–15.5)
WBC: 7.3 10*3/uL (ref 4.0–10.5)

## 2012-03-03 LAB — BLOOD GAS, ARTERIAL
Acid-base deficit: 1.7 mmol/L (ref 0.0–2.0)
Bicarbonate: 22.5 mEq/L (ref 20.0–24.0)
Drawn by: 352351
O2 Content: 2 L/min
O2 Saturation: 93.8 %
Patient temperature: 98.6
TCO2: 23.6 mmol/L (ref 0–100)
pCO2 arterial: 37.5 mmHg (ref 35.0–45.0)
pH, Arterial: 7.395 (ref 7.350–7.450)
pO2, Arterial: 70.7 mmHg — ABNORMAL LOW (ref 80.0–100.0)

## 2012-03-03 LAB — PROTIME-INR
INR: 1.07 (ref 0.00–1.49)
Prothrombin Time: 14.1 seconds (ref 11.6–15.2)

## 2012-03-03 LAB — PREPARE RBC (CROSSMATCH)

## 2012-03-03 LAB — C-REACTIVE PROTEIN: CRP: 26.2 mg/dL — ABNORMAL HIGH (ref <0.60)

## 2012-03-03 MED ORDER — KCL IN DEXTROSE-NACL 20-5-0.45 MEQ/L-%-% IV SOLN
INTRAVENOUS | Status: DC
  Administered 2012-03-03: 1000 mL via INTRAVENOUS
  Filled 2012-03-03 (×3): qty 1000

## 2012-03-03 MED ORDER — WARFARIN SODIUM 7.5 MG PO TABS
7.5000 mg | ORAL_TABLET | Freq: Once | ORAL | Status: AC
  Administered 2012-03-03: 7.5 mg via ORAL
  Filled 2012-03-03: qty 1

## 2012-03-03 MED ORDER — TRAMADOL HCL 50 MG PO TABS
50.0000 mg | ORAL_TABLET | ORAL | Status: DC | PRN
  Administered 2012-03-05: 50 mg via ORAL
  Administered 2012-03-05: 100 mg via ORAL
  Administered 2012-03-05: 50 mg via ORAL
  Administered 2012-03-05 – 2012-03-08 (×6): 100 mg via ORAL
  Filled 2012-03-03 (×8): qty 2

## 2012-03-03 NOTE — Progress Notes (Signed)
Patient ID: Julie Ross, female   DOB: 1961-02-25, 51 y.o.   MRN: [id number]    [facility]    Date of Admission:  03/02/2012           Day 2 vancomycin Principal Problem:  *Infected prosthetic knee joint      . baclofen  10 mg Oral TID  . coumadin book   Does not apply Once  . docusate sodium  100 mg Oral BID  . DULoxetine  60 mg Oral Daily  . gabapentin  600 mg Oral TID  . hydrochlorothiazide  12.5 mg Oral Daily  . LORazepam  0.5 mg Oral Q8H  . losartan  50 mg Oral Daily  . multivitamin with minerals  1 tablet Oral Daily  . mupirocin ointment   Nasal Once  . mupirocin ointment   Nasal Once  . pantoprazole  80 mg Oral Q1200  . vancomycin  1,000 mg Intravenous Q12H  . warfarin  7.5 mg Oral Once  . warfarin   Does not apply Once  . Warfarin - Pharmacist Dosing Inpatient   Does not apply q1800  . DISCONTD: losartan-hydrochlorothiazide  1 tablet Oral Daily  . DISCONTD: mupirocin ointment   Nasal Once   Objective: Temp:  [97.4 F (36.3 C)-101.3 F (38.5 C)] 98.1 F (36.7 C) (09/14 1045) Pulse Rate:  [91-114] 93  (09/14 1045) Resp:  [16-20] 18  (09/14 1045) BP: (114-155)/(49-78) 117/58 mmHg (09/14 1045) SpO2:  [95 %-100 %] 98 % (09/14 0553) Weight:  [88.451 kg (195 lb)] 88.451 kg (195 lb) (09/13 1343)   Lab Results Lab Results  Component Value Date   WBC 7.3 03/03/2012   HGB 7.5* 03/03/2012   HCT 22.4* 03/03/2012   MCV 79.7 03/03/2012   PLT 89* 03/03/2012    Lab Results  Component Value Date   CREATININE 1.18* 03/03/2012   BUN 13 03/03/2012   NA 123* 03/03/2012   K 3.2* 03/03/2012   CL 88* 03/03/2012   CO2 23 03/03/2012    Lab Results  Component Value Date   ALT 57* 03/02/2012   AST 45* 03/02/2012   ALKPHOS 317* 03/02/2012   BILITOT 2.0* 03/02/2012      Microbiology: Recent Results (from the past 240 hour(s))  SURGICAL PCR SCREEN     Status: Abnormal   Collection Time   03/02/12  1:35 PM      Component Value Range Status Comment   MRSA,  PCR NEGATIVE  NEGATIVE Final    Staphylococcus aureus POSITIVE (*) NEGATIVE Final   ANAEROBIC CULTURE     Status: Normal (Preliminary result)   Collection Time   03/02/12  4:20 PM      Component Value Range Status Comment   Specimen Description SYNOVIAL FLUID KNEE LEFT   Final    Special Requests RECEIVED ON SWAB   Final    Gram Stain     Final    Value: MODERATE WBC PRESENT, PREDOMINANTLY PMN     RARE GRAM POSITIVE COCCI     IN PAIRS Gram Stain Report Called to,Read Back By and Verified With: Gram Stain Report Called to,Read Back By and Verified With: DR GRAVES AT 1652 03/02/12 BY ROMEROJ Performed at [facility]   Culture PENDING   Incomplete    Report Status PENDING   Incomplete   BODY FLUID CULTURE     Status: Normal (Preliminary result)   Collection Time   03/02/12  4:20 PM  Component Value Range Status Comment   Specimen Description SYNOVIAL FLUID KNEE LEFT   Final    Special Requests RECEIVED ON SWAB   Final    Gram Stain     Final    Value: MODERATE WBC PRESENT, PREDOMINANTLY PMN     RARE GRAM POSITIVE COCCI     IN PAIRS Gram Stain Report Called to,Read Back By and Verified With: Gram Stain Report Called to,Read Back By and Verified With: DR GRAVES AT 1652 03/02/12 BY ROMEROJ Performed at [facility]   Culture PENDING   Incomplete    Report Status PENDING   Incomplete   GRAM STAIN     Status: Normal   Collection Time   03/02/12  4:20 PM      Component Value Range Status Comment   Specimen Description SYNOVIAL FLUID KNEE LEFT   Final    Special Requests NONE   Final    Gram Stain     Final    Value: MODERATE WBC PRESENT, PREDOMINANTLY PMN     RARE GRAM POSITIVE COCCI IN PAIRS     CALLED TO DR. Luiz Blare 9.13.13 AT 1652 BY ROMEROJ   Report Status 03/02/2012 FINAL   Final    Assessment: Julie Ross has a septic prosthetic left knee with strep or staph based on synovial fluid Gram stain. Cultures are pending. I will continue vancomycin for now.    Plan: 1. Continue vancomycin pending final cultures  Cliffton Asters, MD [facility] [phone] pager   [phone] cell 03/03/2012, 11:04 AM

## 2012-03-03 NOTE — Progress Notes (Signed)
PATIENT ID: Julie Ross  MRN: [id number]  DOB/AGE:  [birth year] / 51 y.o.  1 Day Post-Op Procedure(s) (LRB): IRRIGATION AND DEBRIDEMENT KNEE WITH POLY EXCHANGE (Left)    PROGRESS NOTE Subjective: Patient is alert, oriented, no Nausea, no Vomiting, yes passing gas, no Bowel Movement. Taking PO well. Denies SOB, Chest or Calf Pain. Complains of fatigue.  Using Incentive Spirometer, PAS in place. Patient reports pain as moderate  .    Objective: Vital signs in last 24 hours: Filed Vitals:   03/02/12 2321 03/03/12 0200 03/03/12 0358 03/03/12 0553  BP:  123/56  129/49  Pulse:  110  103  Temp:  100.5 F (38.1 C)  99.6 F (37.6 C)  TempSrc:      Resp: 18 16 18 18   Height:      Weight:      SpO2: 97% 98% 98% 98%      Intake/Output from previous day: I/O last 3 completed shifts: In: 1950 [I.V.:1700; IV Piggyback:250] Out: 725 [Urine:525; Drains:150; Blood:50]   Intake/Output this shift:     LABORATORY DATA:  Basename 03/03/12 0545 03/02/12 1403 03/02/12 1402  WBC 7.3 7.4 --  HGB 7.5* 9.4* --  HCT 22.4* 27.9* --  PLT 89* 89* --  NA -- -- 130*  K -- -- 3.6  CL -- -- 92*  CO2 -- -- 24  BUN -- -- 12  CREATININE -- -- 0.93  GLUCOSE -- -- 143*  GLUCAP -- -- --  INR 1.07 1.07 --  CALCIUM -- -- 9.2    Examination: Neurologically intact ABD soft Neurovascular intact Sensation intact distally Intact pulses distally Dorsiflexion/Plantar flexion intact Incision: dressing C/D/I} Drain output 150  Assessment:   1 Day Post-Op Procedure(s) (LRB): IRRIGATION AND DEBRIDEMENT KNEE WITH POLY EXCHANGE (Left) ADDITIONAL DIAGNOSIS:  Acute Blood Loss Anemia  Plan: PT/OT WBAT, No CPM, no ROM with PT. Transfuse 1 unit PRBC today DVT Prophylaxis:  SCDx72hrs, Coumadin x 4 weeks DISCHARGE PLAN: Home DISCHARGE NEEDS: HHPT, HHRN, Walker and 3-in-1 comode seat     Julie Ross M. 03/03/2012, 8:07 AM

## 2012-03-03 NOTE — Progress Notes (Signed)
PT Cancellation Note  Treatment cancelled today due to patient's refusal to participate due to pain, not feeling well.  Julie Ross 03/03/2012, 2:22 PM

## 2012-03-03 NOTE — Progress Notes (Signed)
Called by primary RN to see pt for [facility]  On arrival pt alert & able to answer questions but slowly & with frequent prompting.  Pt is 24hr out of surgery & with frequent narcotic use today. VSS  ABG 7.395/37.5/70.9/93.8. Updated family & J. Madrone, [place] & new orders rcvd.

## 2012-03-03 NOTE — Op Note (Signed)
NAME:  Julie Ross, Julie Ross                 ACCOUNT NO.:  [id number]  MEDICAL RECORD NO.:  [id number]  LOCATION:  [id number]                        FACILITY:  [facility]  PHYSICIAN:  Harvie Junior, M.D.   DATE OF BIRTH:  1961-03-21  DATE OF PROCEDURE:  03/02/2012 DATE OF DISCHARGE:                              OPERATIVE REPORT   PREOPERATIVE DIAGNOSIS:  Infected left total knee.  POSTOPERATIVE DIAGNOSIS:  Infected left total knee.  PROCEDURE: 1. Revision total knee, 1 component. 2. Extensive 4-compartment synovectomy with irrigation and debridement     of subcutaneous tissue, fascia, muscle, and bone.  SURGEON:  Harvie Junior, MD  ASSISTANT:  Marshia Ly, PA  ANESTHESIA:  General.  BRIEF HISTORY:  Julie Ross is a 51 year old female with a long history of having had a left total knee in February.  She unfortunately was diagnosed with breast cancer shortly after her knee replacement and underwent chemotherapy.  She was actually seen in our office on Tuesday of this week with her knee doing well and on Thursday re-presented with a sudden onset severe pain in the knee.  She had a red hot swollen knee which was aspirated.  It showed 117,000 white cells in the fluid and the culture on the Gram stain was positive for gram-positive cocci.  At that point, we had already put her on the schedule because of the look of the fluid for I and D and revision and she was brought to the operating room for this procedure.  PROCEDURE:  The patient was brought to the operating room.  After adequate level of anesthesia was obtained with general anesthetic, the patient was placed supine on the operating table.  Left leg was prepped and draped in usual sterile fashion.  Following this, the leg was elevated for 3 minutes and blood pressure tourniquet inflated to 350 mmHg.  Following this, her old incision was exploited. Subcutaneous tissue down to level of the extensor mechanism and a medial  parapatellar arthrotomy was undertaken.  Gross pus was encountered and lots of it. This was cultured and sent and at that point antibiotics were started, vancomycin.  At this point, the knee then underwent a thorough synovectomy superior posterior.  I took the poly out to be given to the back of the knee and did a synovectomy anteriorly posteriorly, medial laterally and superiorly.  Once the synovectomy was performed, we put Clorpactin in the knee to get any kind of slime layer off of the component.  We did this for 1 minute and then washed this through.  We put peroxide in the knee for a 50/50 mixture for 1 minute and then irrigated this through.  We then did 6 L of pulsatile lavage irrigation. At this point, the tourniquet was let down before we started irrigating. At this point, the trials were then placed, 10 still seemed to be the appropriate poly size.  A new 10 poly was opened and the revision knee was completed by placing the final new polyethylene liner.  Once this was completed, 2 medium Hemovac drains were placed and sewed in.  The deep layer was closed with PDS running.  Couple of PDS were placed  in the subcu and then the skin was closed with staples.  A sterile compressive dressing was applied with a knee immobilizer and the patient was taken to recovery and she was noted to be in satisfactory condition.  The estimated blood loss for procedure was minimal.     Harvie Junior, M.D.     Ranae Plumber  D:  03/02/2012  T:  03/03/2012  Job:  [id number]

## 2012-03-03 NOTE — Progress Notes (Signed)
ANTICOAGULATION CONSULT NOTE - Follow Up Consult  Pharmacy Consult for Warfarin Indication: VTE prophylaxis  No Known Allergies  Patient Measurements: Height: 5\' 6"  (167.6 cm) Weight: 195 lb (88.451 kg) IBW/kg (Calculated) : 59.3   Vital Signs: Temp: 97.9 F (36.6 C) (09/14 1208) BP: 137/56 mmHg (09/14 1208) Pulse Rate: 93  (09/14 1208)  Labs:  Basename 03/03/12 0545 03/02/12 1403 03/02/12 1402  HGB 7.5* 9.4* --  HCT 22.4* 27.9* --  PLT 89* 89* --  APTT -- 34 --  LABPROT 14.1 14.1 --  INR 1.07 1.07 --  HEPARINUNFRC -- -- --  CREATININE 1.18* -- 0.93  CKTOTAL -- -- --  CKMB -- -- --  TROPONINI -- -- --    Estimated Creatinine Clearance: 61.8 ml/min (by C-G formula based on Cr of 1.18).   Medications:  Scheduled:    . baclofen  10 mg Oral TID  . coumadin book   Does not apply Once  . docusate sodium  100 mg Oral BID  . DULoxetine  60 mg Oral Daily  . gabapentin  600 mg Oral TID  . hydrochlorothiazide  12.5 mg Oral Daily  . LORazepam  0.5 mg Oral Q8H  . losartan  50 mg Oral Daily  . multivitamin with minerals  1 tablet Oral Daily  . mupirocin ointment   Nasal Once  . pantoprazole  80 mg Oral Q1200  . vancomycin  1,000 mg Intravenous Q12H  . warfarin  7.5 mg Oral Once  . warfarin   Does not apply Once  . Warfarin - Pharmacist Dosing Inpatient   Does not apply q1800  . DISCONTD: losartan-hydrochlorothiazide  1 tablet Oral Daily    Assessment: Pt is a 50 YOF s/p I&D of infected prosthetic knee joint. She is on coumadin day 2 for VTE px.  LFTs are mildy elevated, INR today is 1.07 (from 1.07), H/H 7.5/22.7, Plts 89, no evidence of bleeding reported, per MD note-to transfuse 1 unit PRBC, no evidence of active bleeding noted.   Goal of Therapy:  INR goal of 2 per MD Monitor platelets by anticoagulation protocol: Yes   Plan:  -Warfarin 7.5mg  PO x 1 at 1800 -Daily PT/INR -Monitor H/H/Plts closely -Monitor for signs of active bleeding  Abran Duke,  PharmD Clinical Pharmacist Phone: [phone] Pager: [phone] 03/03/2012 3:41 PM

## 2012-03-04 ENCOUNTER — Other Ambulatory Visit (HOSPITAL_COMMUNITY): Payer: BC Managed Care – PPO

## 2012-03-04 ENCOUNTER — Inpatient Hospital Stay (HOSPITAL_COMMUNITY): Payer: BC Managed Care – PPO

## 2012-03-04 DIAGNOSIS — E871 Hypo-osmolality and hyponatremia: Secondary | ICD-10-CM | POA: Diagnosis present

## 2012-03-04 DIAGNOSIS — R7881 Bacteremia: Secondary | ICD-10-CM | POA: Diagnosis present

## 2012-03-04 DIAGNOSIS — E860 Dehydration: Secondary | ICD-10-CM

## 2012-03-04 DIAGNOSIS — E876 Hypokalemia: Secondary | ICD-10-CM | POA: Diagnosis present

## 2012-03-04 LAB — CBC
HCT: 24.8 % — ABNORMAL LOW (ref 36.0–46.0)
HCT: 23.3 % — ABNORMAL LOW (ref 36.0–46.0)
Hemoglobin: 8.5 g/dL — ABNORMAL LOW (ref 12.0–15.0)
Hemoglobin: 7.9 g/dL — ABNORMAL LOW (ref 12.0–15.0)
MCH: 27.8 pg (ref 26.0–34.0)
MCH: 27.2 pg (ref 26.0–34.0)
MCHC: 34.3 g/dL (ref 30.0–36.0)
MCHC: 33.9 g/dL (ref 30.0–36.0)
MCV: 81 fL (ref 78.0–100.0)
MCV: 80.3 fL (ref 78.0–100.0)
Platelets: 89 10*3/uL — ABNORMAL LOW (ref 150–400)
Platelets: 81 10*3/uL — ABNORMAL LOW (ref 150–400)
RBC: 3.06 MIL/uL — ABNORMAL LOW (ref 3.87–5.11)
RBC: 2.9 MIL/uL — ABNORMAL LOW (ref 3.87–5.11)
RDW: 17.8 % — ABNORMAL HIGH (ref 11.5–15.5)
RDW: 17.7 % — ABNORMAL HIGH (ref 11.5–15.5)
WBC: 6.3 10*3/uL (ref 4.0–10.5)
WBC: 6.8 10*3/uL (ref 4.0–10.5)

## 2012-03-04 LAB — BLOOD GAS, ARTERIAL
Acid-base deficit: 4.5 mmol/L — ABNORMAL HIGH (ref 0.0–2.0)
Bicarbonate: 19.9 mEq/L — ABNORMAL LOW (ref 20.0–24.0)
O2 Content: 2 L/min
O2 Saturation: 99.1 %
Patient temperature: 98.6
TCO2: 21 mmol/L (ref 0–100)
pCO2 arterial: 36 mmHg (ref 35.0–45.0)
pH, Arterial: 7.362 (ref 7.350–7.450)
pO2, Arterial: 154 mmHg — ABNORMAL HIGH (ref 80.0–100.0)

## 2012-03-04 LAB — BASIC METABOLIC PANEL
BUN: 25 mg/dL — ABNORMAL HIGH (ref 6–23)
CO2: 21 mEq/L (ref 19–32)
Calcium: 8.6 mg/dL (ref 8.4–10.5)
Chloride: 90 mEq/L — ABNORMAL LOW (ref 96–112)
Creatinine, Ser: 3.78 mg/dL — ABNORMAL HIGH (ref 0.50–1.10)
GFR calc Af Amer: 15 mL/min — ABNORMAL LOW (ref >90)
GFR calc non Af Amer: 13 mL/min — ABNORMAL LOW (ref >90)
Glucose, Bld: 149 mg/dL — ABNORMAL HIGH (ref 70–99)
Potassium: 3.9 mEq/L (ref 3.5–5.1)
Sodium: 127 mEq/L — ABNORMAL LOW (ref 135–145)

## 2012-03-04 LAB — URINALYSIS, ROUTINE W REFLEX MICROSCOPIC
Bilirubin Urine: NEGATIVE
Glucose, UA: NEGATIVE mg/dL
Hgb urine dipstick: NEGATIVE
Ketones, ur: NEGATIVE mg/dL
Leukocytes, UA: NEGATIVE
Nitrite: NEGATIVE
Protein, ur: NEGATIVE mg/dL
Specific Gravity, Urine: 1.016 (ref 1.005–1.030)
Urobilinogen, UA: 1 mg/dL (ref 0.0–1.0)
pH: 5 (ref 5.0–8.0)

## 2012-03-04 LAB — COMPREHENSIVE METABOLIC PANEL
ALT: 34 U/L (ref 0–35)
AST: 29 U/L (ref 0–37)
Albumin: 2.5 g/dL — ABNORMAL LOW (ref 3.5–5.2)
Alkaline Phosphatase: 419 U/L — ABNORMAL HIGH (ref 39–117)
BUN: 21 mg/dL (ref 6–23)
CO2: 22 mEq/L (ref 19–32)
Calcium: 8.5 mg/dL (ref 8.4–10.5)
Chloride: 89 mEq/L — ABNORMAL LOW (ref 96–112)
Creatinine, Ser: 3.42 mg/dL — ABNORMAL HIGH (ref 0.50–1.10)
GFR calc Af Amer: 17 mL/min — ABNORMAL LOW (ref >90)
GFR calc non Af Amer: 14 mL/min — ABNORMAL LOW (ref >90)
Glucose, Bld: 163 mg/dL — ABNORMAL HIGH (ref 70–99)
Potassium: 3.8 mEq/L (ref 3.5–5.1)
Sodium: 125 mEq/L — ABNORMAL LOW (ref 135–145)
Total Bilirubin: 1 mg/dL (ref 0.3–1.2)
Total Protein: 6.3 g/dL (ref 6.0–8.3)

## 2012-03-04 LAB — VANCOMYCIN, TROUGH: Vancomycin Tr: 32.7 ug/mL (ref 10.0–20.0)

## 2012-03-04 LAB — URINE MICROSCOPIC-ADD ON

## 2012-03-04 LAB — LACTIC ACID, PLASMA: Lactic Acid, Venous: 0.7 mmol/L (ref 0.5–2.2)

## 2012-03-04 LAB — BODY FLUID CULTURE

## 2012-03-04 LAB — PREPARE RBC (CROSSMATCH)

## 2012-03-04 LAB — SODIUM, URINE, RANDOM: Sodium, Ur: <10 mEq/L

## 2012-03-04 LAB — PROTIME-INR
INR: 1.03 (ref 0.00–1.49)
Prothrombin Time: 13.7 seconds (ref 11.6–15.2)

## 2012-03-04 MED ORDER — WARFARIN SODIUM 10 MG PO TABS
10.0000 mg | ORAL_TABLET | Freq: Once | ORAL | Status: DC
Start: 2012-03-04 — End: 2012-03-04
  Filled 2012-03-04: qty 1

## 2012-03-04 MED ORDER — SODIUM CHLORIDE 0.9 % IV SOLN
INTRAVENOUS | Status: AC
  Administered 2012-03-04: 1000 mL via INTRAVENOUS
  Administered 2012-03-06: 100 mL via INTRAVENOUS

## 2012-03-04 MED ORDER — BACLOFEN 5 MG HALF TABLET
5.0000 mg | ORAL_TABLET | Freq: Three times a day (TID) | ORAL | Status: DC
  Filled 2012-03-04 (×2): qty 1

## 2012-03-04 MED ORDER — SODIUM CHLORIDE 0.9 % IV BOLUS (SEPSIS)
500.0000 mL | Freq: Once | INTRAVENOUS | Status: AC
  Administered 2012-03-04: 16:00:00 via INTRAVENOUS

## 2012-03-04 NOTE — Progress Notes (Signed)
PATIENT ID: Julie Ross  MRN: [id number]  DOB/AGE:  June 20, 1961 / 51 y.o.  2 Days Post-Op Procedure(s) (LRB): IRRIGATION AND DEBRIDEMENT KNEE WITH POLY EXCHANGE (Left)    PROGRESS NOTE Subjective Patient is lethargic today and does not respond to questioning.  She was alert and oriented until yesterday evening when she became very sedated.  Pain meds were discontinued but she remained sedated overnight.  IVF was saline locked early in the day yesterday, but D5 1/2 normal saline with 20 mEq K+ was started last night and then changed to normal saline this morning.  1 unit of PRBC was given yesterday without complication.    Objective: Vital signs in last 24 hours: Filed Vitals:   03/03/12 2107 03/04/12 0311 03/04/12 0633 03/04/12 0752  BP: 114/44 118/62 120/58 103/54  Pulse: 61 66 68 82  Temp: 98.5 F (36.9 C) 98 F (36.7 C) 98.7 F (37.1 C) 98.6 F (37 C)  TempSrc: Oral Axillary Axillary Axillary  Resp: 16 16 16 14   Height:      Weight:      SpO2: 97% 95% 97% 99%      Intake/Output from previous day: I/O last 3 completed shifts: In: 650 [P.O.:600; I.V.:50] Out: 1550 [Urine:1340; Drains:210]   Intake/Output this shift:     LABORATORY DATA:  Basename 03/04/12 0440 03/03/12 0545 03/02/12 1402  WBC 6.3 7.3 --  HGB 8.5* 7.5* --  HCT 24.8* 22.4* --  PLT 89* 89* --  NA -- 123* 130*  K -- 3.2* 3.6  CL -- 88* 92*  CO2 -- 23 24  BUN -- 13 12  CREATININE -- 1.18* 0.93  GLUCOSE -- 158* 143*  GLUCAP -- -- --  INR 1.03 1.07 --  CALCIUM -- 8.5 --    Examination: ABD soft Incision: dressing C/D/I} Drain output 50 overnight. Patient is lethargic and does not respond to questioning.  ABG and vitals stable.  Assessment:   2 Days Post-Op Procedure(s) (LRB): IRRIGATION AND DEBRIDEMENT KNEE WITH POLY EXCHANGE (Left) ADDITIONAL DIAGNOSIS:  Acute Blood Loss Anemia - improved after 1 unit PRBC #2 Sedation  Plan: DVT Prophylaxis: SCDx72 hrs, Coumadin x 4 weeks  [facility]  hospitalist service has been consulted to manage sedation.  STAT BMP was ordered this morning.    DISCHARGE PLAN: Home when stable.  DISCHARGE NEEDS: Walker and 3-in-1 comode seat

## 2012-03-04 NOTE — Progress Notes (Signed)
Pt received bed on 2927. Report called to receiving nurse.  Family updated on new room location.  Rhonda Linan N

## 2012-03-04 NOTE — Progress Notes (Signed)
Called to patient room w/ request for pain meds.  Upon entering, daughter at bedside stated "there's something wrong with my mom, she says she can't breathe".  Patient lying in bed but making frequent attempts to get oob stating "I can't breath, I need to go to the hospital".  Patient very slow to respond to questions & needed frequent prompting to complete answers, pt stated she was at home, was unable to tell this RN what her last name was, could give month & day of birth but not the year stating "I don't know, I don't remember, there's something wrong with me, I can't breathe, I need to go to the hospital".  Daughter denies any previous confusion or similar episode this admission or prior hospital admissions.  V/S taken, BP 114/44, HR 61, T 98.5, R 16, O2 sat on RA 97%.  Physical assessment done with regular HR noted & LCTA after repeated encouragement of patient to take deep breaths.  O2 @ 2L via [place] placed.  Call placed to Rapid Response RN & then to Respiratory Therapist to further assess patient per RR Nurse request.  On-call physician paged, Shirl Harris PA responded to page and was notified of the above events, orders received.  Dorothyann Peng RN

## 2012-03-04 NOTE — Progress Notes (Signed)
Pt. Difficult to arouse, cool, clammy with SBP 62. Dr. Benjamine Mola notified;  NS fluid bolus initiated.

## 2012-03-04 NOTE — Progress Notes (Signed)
Dr. Marlin Canary at the bedside examining patient and updating family regarding patient plan of care.  Patient to be monitored closer on unit 2900 per Dr. Benjamine Mola.  Patient and family was made aware of change in care.  Nephrology to consulted to follow patient acute renal function.  Vancomycin trough scheduled for 1530. Vancomycin DC for at this time with pending lab result.  Sherral Dirocco N

## 2012-03-04 NOTE — Consult Note (Signed)
[facility] Medical Consultation  MARG MACMASTER VWU:[id number] DOB: 05/24/1961 DOA: 03/02/2012 PCP: [name]   Requesting physician: Dr. Luiz Blare Date of consultation: 03/04/12 Reason for consultation: AMS  Impression/Recommendations Principal Problem:  *Infected prosthetic knee joint Active Problems:  Cancer of upper-outer quadrant of female breast  Dehydration  Hypokalemia  Hyponatremia  Bacteremia    1. Bacteremia- BC x2 + for gram + cocci, has infected knee  S/p extensive 4 compartment synovectomy with irrigation and debridement of subcutaneous tissue- ID following- on vanc 2. AKI- IVF, consult nephro- I think she is dry- stop HCTZ, stop ARB, foley placed 3. Hyponatremia- IVF, check BMP in 6 hours, had been given 1/2 NS 4. Hypokalemia- replace 5. Anemia- s/p 1 unit PRBCs 6. Breast ca with chemo- on hold for now 7. ? Beginning of sepsis- BP low, check LA- has + BC, IVF, transfer to step down 8. AMS- seems to be better per nursing but still not back to baseline- consider MRI/CT scan of head in future if patient does not improve but for now continue to monitor, no focal deficits on exam 9. Hypotension- hold BP medications 10. Decreased platelets- monitor- abx/infection related  I will followup again tomorrow. Please contact me if I can be of assistance in the meanwhile. Thank you for this consultation. Discussed plan with Victorino Dike, [place]   Chief Complaint: change in mental status  HPI:  51 yo female who was admitted with knee pain.  She presented to an acutely swollen and painful knee.  She subsequently underwent an extensive 4-compartment synovectomy with irrigation and debridement of subcutaneous tissue, fascia, muscle, and bone.  After her surgery, she became less responsive on the floor, had some lab values that were not normal and [facility] were consulted to help manage patient.  ABG was done.  She is currently more awake now than even this AM.  She know  who she is and where she is.  All her pain medication was held.  A foley was placed with minimal urine output and IVF was started. No fever, no chills No N/V/D Blood cultures have come back as positive.      Review of Systems:  All systems reviewed, negative unless stated above  Past Medical History  Diagnosis Date  . Hypertension   . Hypothyroidism   . Arthritis   . Neuromuscular disorder     MS TX WITH CYMBALTA   . Multiple sclerosis   . H/O colonoscopy   . H/O bone density study 03/2011  . Wears glasses   . Heart burn   . Depression   . Sleep apnea     MILD SLEEP APNEA, NO MACHINE  6 YRS AGO [facility]   . Breast cancer     lt breast   Past Surgical History  Procedure Date  . Tonsillectomy   . Tumor removal     FROM PELVIS AGE 58  . Cesarean section     X2   . No past surgeries     BLADDER SLING  4-5 YRS AGO   . Knee arthroscopy     LT KNEE 04/2011  . Total knee arthroplasty 08/15/2011    Procedure: TOTAL KNEE ARTHROPLASTY; lft Surgeon: Harvie Junior, MD;  Location: [facility] OR;  Service: Orthopedics;  Laterality: Left;  RIGHT KNEE CORTIZONE INJECTION  . Mohs surgery   . Knee arthroscopy 82    rt  . Joint replacement Feb 2013  . Mastectomy w/ sentinel node biopsy 12/19/2011  Procedure: MASTECTOMY WITH SENTINEL LYMPH NODE BIOPSY;  Surgeon: Currie Paris, MD;  Location: [facility] OR;  Service: General;  Laterality: Left;  left breast and left axilla   . Portacath placement 01/16/2012    Procedure: INSERTION PORT-A-CATH;  Surgeon: Currie Paris, MD;  Location: [facility];  Service: General;  Laterality: Right;  Porta Cath Placement    Social History:  reports that she has never smoked. She has never used smokeless tobacco. She reports that she does not drink alcohol or use illicit drugs.  No Known Allergies Family History  Problem Relation Age of Onset  . Breast cancer Paternal Aunt   . Breast cancer Maternal Grandmother   . Heart disease Maternal  Grandfather     Prior to Admission medications   Medication Sig Start Date End Date Taking? Authorizing Provider  baclofen (LIORESAL) 10 MG tablet Take 10 mg by mouth 3 (three) times daily.   Yes Historical Provider, MD  CALCIUM-MAGNESIUM PO Take 1 tablet by mouth daily.   Yes Historical Provider, MD  clonazePAM (KLONOPIN) 0.5 MG tablet Take 0.5 mg by mouth at bedtime as needed. For sleep   Yes Historical Provider, MD  dexamethasone (DECADRON) 4 MG tablet Take 2 tablets two times a day the day before Taxotere. Then take 2 tabs two times a day starting the day after chemo for 3 days. 12/30/11 12/29/12 Yes Victorino December, MD  DULoxetine (CYMBALTA) 60 MG capsule Take 60 mg by mouth daily.   Yes Historical Provider, MD  esomeprazole (NEXIUM) 40 MG capsule Take 40 mg by mouth daily before breakfast.   Yes Historical Provider, MD  gabapentin (NEURONTIN) 600 MG tablet Take 600 mg by mouth 3 (three) times daily.   Yes Historical Provider, MD  HYDROcodone-acetaminophen (NORCO) 10-325 MG per tablet Take 1 tablet by mouth every 8 (eight) hours as needed. As needed for pain. 12/15/11  Yes Historical Provider, MD  lidocaine-prilocaine (EMLA) cream Apply 1 application topically as needed. 01/18/12 01/17/13 Yes Victorino December, MD  LORazepam (ATIVAN) 0.5 MG tablet Take 0.5 mg by mouth every 8 (eight) hours. As needed for anxiety.   Yes Historical Provider, MD  losartan-hydrochlorothiazide (HYZAAR) 50-12.5 MG per tablet Take 1 tablet by mouth daily.   Yes Historical Provider, MD  Multiple Vitamins-Minerals (MULTIVITAMINS THER. W/MINERALS) TABS Take 1 tablet by mouth daily.   Yes Historical Provider, MD  ondansetron (ZOFRAN) 8 MG tablet Take 1 tablet two times a day starting the day after chemo for 3 days. Then take 1 tab two times a day as needed for nausea or vomiting. 12/30/11 12/29/12 Yes Victorino December, MD  prochlorperazine (COMPAZINE) 10 MG tablet Take 1 tablet (10 mg total) by mouth every 6 (six) hours as needed  (Nausea or vomiting). 12/30/11 12/29/12 Yes Victorino December, MD  prochlorperazine (COMPAZINE) 25 MG suppository Place 1 suppository (25 mg total) rectally every 12 (twelve) hours as needed for nausea. 12/30/11 12/29/12 Yes Victorino December, MD  naproxen sodium (ANAPROX) 550 MG tablet Take 550 mg by mouth as needed.    Historical Provider, MD  PRESCRIPTION MEDICATION Chemotherapy regimen. Taxotere.    Historical Provider, MD  UNABLE TO FIND Cranial Prothesis 01/18/12   Victorino December, MD   Physical Exam: Blood pressure 103/54, pulse 82, temperature 98.6 F (37 C), temperature source Axillary, resp. rate 14, height 5\' 6"  (1.676 m), weight 88.451 kg (195 lb), last menstrual period 12/05/2011, SpO2 99.00%. Filed Vitals:   03/03/12 2107 03/04/12  1610 03/04/12 0633 03/04/12 0752  BP: 114/44 118/62 120/58 103/54  Pulse: 61 66 68 82  Temp: 98.5 F (36.9 C) 98 F (36.7 C) 98.7 F (37.1 C) 98.6 F (37 C)  TempSrc: Oral Axillary Axillary Axillary  Resp: 16 16 16 14   Height:      Weight:      SpO2: 97% 95% 97% 99%     General:  Ill appearing,   Eyes: glassy  Neck: supple, no JVD  Cardiovascular: rrr  Respiratory: dec effort and dec BS b/l, no wheezing  Abdomen: +BS, mildly firm- can feel bowel gas moving with palpation (has not had BM since 9/11says this is normal for her)  Skin: left leg in brace  Musculoskeletal: moves all 4 extremtities  Psychiatric: no SI/no HI  Neurologic: oriented to place and person but slow to respond, follows some commands- requires prompting  Labs on Admission:  Basic Metabolic Panel:  Lab 03/04/12 9604 03/03/12 0545 03/02/12 1402  NA 125* 123* 130*  K 3.8 3.2* 3.6  CL 89* 88* 92*  CO2 22 23 24   GLUCOSE 163* 158* 143*  BUN 21 13 12   CREATININE 3.42* 1.18* 0.93  CALCIUM 8.5 8.5 9.2  MG -- -- --  PHOS -- -- --   Liver Function Tests:  Lab 03/04/12 0908 03/02/12 1402  AST 29 45*  ALT 34 57*  ALKPHOS 419* 317*  BILITOT 1.0 2.0*  PROT 6.3 6.9    ALBUMIN 2.5* 3.0*   No results found for this basename: LIPASE:5,AMYLASE:5 in the last 168 hours No results found for this basename: AMMONIA:5 in the last 168 hours CBC:  Lab 03/04/12 0440 03/03/12 0545 03/02/12 1403  WBC 6.3 7.3 7.4  NEUTROABS -- -- 6.6  HGB 8.5* 7.5* 9.4*  HCT 24.8* 22.4* 27.9*  MCV 81.0 79.7 80.2  PLT 89* 89* 89*   Cardiac Enzymes: No results found for this basename: CKTOTAL:5,CKMB:5,CKMBINDEX:5,TROPONINI:5 in the last 168 hours BNP: No components found with this basename: POCBNP:5 CBG: No results found for this basename: GLUCAP:5 in the last 168 hours  Radiological Exams on Admission: No results found.    Time spent: > 70 minutes  Chivonne Rascon [facility] Pager [phone]  If 7PM-7AM, please contact night-coverage [email or web address] Password [email or web address] 03/04/2012, 12:01 PM

## 2012-03-04 NOTE — Progress Notes (Signed)
Patient ID: Julie Ross, female   DOB: 19-Sep-1960, 51 y.o.   MRN: [id number]    [facility]    Date of Admission:  03/02/2012           Day 3 vancomycin Principal Problem:  *Infected prosthetic knee joint Active Problems:  Cancer of upper-outer quadrant of female breast  Dehydration  Hypokalemia  Hyponatremia  Bacteremia      . baclofen  5 mg Oral TID  . docusate sodium  100 mg Oral BID  . DULoxetine  60 mg Oral Daily  . gabapentin  600 mg Oral TID  . LORazepam  0.5 mg Oral Q8H  . multivitamin with minerals  1 tablet Oral Daily  . pantoprazole  80 mg Oral Q1200  . warfarin  10 mg Oral ONCE-1800  . warfarin  7.5 mg Oral ONCE-1800  . warfarin   Does not apply Once  . Warfarin - Pharmacist Dosing Inpatient   Does not apply q1800  . DISCONTD: baclofen  10 mg Oral TID  . DISCONTD: hydrochlorothiazide  12.5 mg Oral Daily  . DISCONTD: losartan  50 mg Oral Daily  . DISCONTD: vancomycin  1,000 mg Intravenous Q12H    Objective: Temp:  [97.6 F (36.4 C)-99.8 F (37.7 C)] 98.3 F (36.8 C) (09/15 1542) Pulse Rate:  [61-101] 72  (09/15 1545) Resp:  [7-20] 16  (09/15 1545) BP: (62-125)/(40-73) 62/40 mmHg (09/15 1545) SpO2:  [95 %-100 %] 100 % (09/15 1545)  General: Now in intensive care unit after becoming lethargic yesterday and complained of shortness of breath. She does not arouse to voice. She is having some brief apneic periods Skin: No splinter or conjunctival hemorrhages Chest: Right upper chest Port-A-Cath site appears normal Lungs: Few expiratory wheezes Cor: Regular S1 and S2 with no murmurs Abdomen: Obese, soft and not obviously tender Knee drain remains in with some bloody drainage  Lab Results Lab Results  Component Value Date   WBC 6.3 03/04/2012   HGB 8.5* 03/04/2012   HCT 24.8* 03/04/2012   MCV 81.0 03/04/2012   PLT 89* 03/04/2012    Lab Results  Component Value Date   CREATININE 3.42* 03/04/2012   BUN 21 03/04/2012   NA 125*  03/04/2012   K 3.8 03/04/2012   CL 89* 03/04/2012   CO2 22 03/04/2012    Lab Results  Component Value Date   ALT 34 03/04/2012   AST 29 03/04/2012   ALKPHOS 419* 03/04/2012   BILITOT 1.0 03/04/2012      Microbiology: Recent Results (from the past 240 hour(s))  SURGICAL PCR SCREEN     Status: Abnormal   Collection Time   03/02/12  1:35 PM      Component Value Range Status Comment   MRSA, PCR NEGATIVE  NEGATIVE Final    Staphylococcus aureus POSITIVE (*) NEGATIVE Final   ANAEROBIC CULTURE     Status: Normal (Preliminary result)   Collection Time   03/02/12  4:20 PM      Component Value Range Status Comment   Specimen Description SYNOVIAL FLUID KNEE LEFT   Final    Special Requests RECEIVED ON SWAB   Final    Gram Stain     Final    Value: MODERATE WBC PRESENT, PREDOMINANTLY PMN     RARE GRAM POSITIVE COCCI     IN PAIRS Gram Stain Report Called to,Read Back By and Verified With: Gram Stain Report Called to,Read Back By and Verified With: DR GRAVES AT  1652 03/02/12 BY ROMEROJ Performed at [facility]   Culture     Final    Value: NO ANAEROBES ISOLATED; CULTURE IN PROGRESS FOR 5 DAYS   Report Status PENDING   Incomplete   BODY FLUID CULTURE     Status: Normal   Collection Time   03/02/12  4:20 PM      Component Value Range Status Comment   Specimen Description SYNOVIAL FLUID KNEE LEFT   Final    Special Requests RECEIVED ON SWAB   Final    Gram Stain     Final    Value: MODERATE WBC PRESENT, PREDOMINANTLY PMN     RARE GRAM POSITIVE COCCI     IN PAIRS Gram Stain Report Called to,Read Back By and Verified With: Gram Stain Report Called to,Read Back By and Verified With: DR GRAVES AT 1652 03/02/12 BY ROMEROJ Performed at [facility]   Culture     Final    Value: MODERATE STAPHYLOCOCCUS AUREUS     Note: RIFAMPIN AND GENTAMICIN SHOULD NOT BE USED AS SINGLE DRUGS FOR TREATMENT OF STAPH INFECTIONS.   Report Status 03/04/2012 FINAL   Final    Organism ID, Bacteria STAPHYLOCOCCUS  AUREUS   Final   GRAM STAIN     Status: Normal   Collection Time   03/02/12  4:20 PM      Component Value Range Status Comment   Specimen Description SYNOVIAL FLUID KNEE LEFT   Final    Special Requests NONE   Final    Gram Stain     Final    Value: MODERATE WBC PRESENT, PREDOMINANTLY PMN     RARE GRAM POSITIVE COCCI IN PAIRS     CALLED TO DR. Luiz Blare 9.13.13 AT 1652 BY ROMEROJ   Report Status 03/02/2012 FINAL   Final   CULTURE, BLOOD (ROUTINE X 2)     Status: Normal (Preliminary result)   Collection Time   03/03/12 12:25 AM      Component Value Range Status Comment   Specimen Description BLOOD RIGHT HAND   Final    Special Requests BOTTLES DRAWN AEROBIC AND ANAEROBIC 5CC EACH   Final    Culture  Setup Time 03/03/2012 11:01   Final    Culture     Final    Value: GRAM POSITIVE COCCI IN CLUSTERS     Note: Gram Stain Report Called to,Read Back By and Verified With: ERIN ARNOLD 03/04/12 @ 11:30AM BY RUSCA.   Report Status PENDING   Incomplete   CULTURE, BLOOD (ROUTINE X 2)     Status: Normal (Preliminary result)   Collection Time   03/03/12 12:40 AM      Component Value Range Status Comment   Specimen Description BLOOD RIGHT HAND   Final    Special Requests BOTTLES DRAWN AEROBIC AND ANAEROBIC [facility] EACH   Final    Culture  Setup Time 03/03/2012 11:01   Final    Culture     Final    Value: GRAM POSITIVE COCCI IN CLUSTERS     Note: Gram Stain Report Called to,Read Back By and Verified With: ERIN ARNOLD 03/04/12 @ 11:30AM BY RUSCA.   Report Status PENDING   Incomplete     Studies/Results: Dg Chest Port 1 View  03/04/2012  *RADIOLOGY REPORT*  Clinical Data: Bacteremia.  Infected knee prosthesis.  Acute renal insufficiency.  PORTABLE CHEST - 1 VIEW 03/04/2012 1309 hours:  Comparison: Portable chest x-ray 01/16/2012 and two-view chest x- ray 08/11/2011.  Findings: Suboptimal inspiration due to body habitus which accounts for crowded bronchovascular markings at the bases and accentuates the  cardiac silhouette.  Taking this into account, cardiac silhouette enlarged but stable.  Lungs clear.  Pulmonary venous hypertension without overt edema.  Right subclavian Port-A-Cath tip projects over the lower SVC.  IMPRESSION: Suboptimal inspiration.  Stable cardiomegaly.  No acute cardiopulmonary disease.   Original Report Authenticated By: Arnell Sieving, M.D.     Assessment: She had sudden worsening yesterday with some confusion, slurred speech and lethargy. If her lethargy persists I would consider a brain scan. She will need repeat blood cultures in the morning and echocardiography.  Plan: 1. Continue vancomycin 2. Repeat blood cultures in the morning 3. Echocardiography this week, preferably TEE if she can tolerate it 4. Consider brain CT scan 5. Situation discussed with her husband  Cliffton Asters, MD [facility] [phone] pager   [phone] cell 03/04/2012, 3:53 PM

## 2012-03-04 NOTE — Progress Notes (Signed)
51yo female on vanc for septic knee and bacteremia, has been on vancomycin, now in ARF, thought to be due to vanc toxicity.  Vanc trough earlier today 32.7.  Initial plan had been to await random level <15 for further dosing given unclear renal function, now to transition to Cubicin.  Will hold off on dosing Cubicin due to high level of vanc and therefore has coverage.  Will continue to follow.  Vernard Gambles, PharmD, BCPS 03/04/2012 11:05 PM

## 2012-03-04 NOTE — Consult Note (Signed)
Julie Ross 03/04/2012 [name] D Requesting Physician:  Dr. Luiz Blare  Reason for Consult:  Acute renal failure HPI: The patient is a 51 y.o. year-old hx of HTN, DJD and breast Ca who underwent L TKR in Feb 2013. She was diagnosed with breast Ca in June and started chemotherapy w docetaxel/carboplatin and getting herceptin weekly.  She had L mastectomy in [place] bx in July and has been receiving regular chemoRx since. She presented to orthopedic office with painful swollen L knee on 9/13. Aspirate showed WBC's and + gram stain for GPC.  She was admitted and underwent I&D w poly exchange the same day. Creatinine was normal 0.93 on admit, yesterday was 1.18 and today is 3.42.  She has been receiving ARB, HCTZ and IV vanc.  These meds have been stopped. Vanc level today was 32.7.  Also BP was low but this really didn't develop until this afternoon with drops into the mid 90's.  Currently UOP is poor, foley is in place. UA shows gran casts o/w is negative. She denies sob, cp, n/v/d, abd pain.     UA 9/15 > no prot, 0-2 rbc, 0-2 wbc, +gran casts, neg LE/nit   ROS  no ha, sore throat  no cough  no skin rash  no weakness or numbness  no n/v  no confusion, hallucination  Past Medical History:  Past Medical History  Diagnosis Date  . Hypertension   . Hypothyroidism   . Arthritis   . Neuromuscular disorder     MS TX WITH CYMBALTA   . Multiple sclerosis   . H/O colonoscopy   . H/O bone density study 03/2011  . Wears glasses   . Heart burn   . Depression   . Sleep apnea     MILD SLEEP APNEA, NO MACHINE  6 YRS AGO HPT [facility]   . Breast cancer     lt breast    Past Surgical History:  Past Surgical History  Procedure Date  . Tonsillectomy   . Tumor removal     FROM PELVIS AGE 51  . Cesarean section     X2   . No past surgeries     BLADDER SLING  4-5 YRS AGO   . Knee arthroscopy     LT KNEE 04/2011  . Total knee arthroplasty 08/15/2011    Procedure: TOTAL KNEE ARTHROPLASTY;  lft Surgeon: Harvie Junior, MD;  Location: [facility] OR;  Service: Orthopedics;  Laterality: Left;  RIGHT KNEE CORTIZONE INJECTION  . Mohs surgery   . Knee arthroscopy 82    rt  . Joint replacement Feb 2013  . Mastectomy w/ sentinel node biopsy 12/19/2011    Procedure: MASTECTOMY WITH SENTINEL LYMPH NODE BIOPSY;  Surgeon: Currie Paris, MD;  Location: [facility] OR;  Service: General;  Laterality: Left;  left breast and left axilla   . Portacath placement 01/16/2012    Procedure: INSERTION PORT-A-CATH;  Surgeon: Currie Paris, MD;  Location: [facility];  Service: General;  Laterality: Right;  Porta Cath Placement     Family History:  Family History  Problem Relation Age of Onset  . Breast cancer Paternal Aunt   . Breast cancer Maternal Grandmother   . Heart disease Maternal Grandfather    Social History:  reports that she has never smoked. She has never used smokeless tobacco. She reports that she does not drink alcohol or use illicit drugs.  Allergies: No Known Allergies  Home medications: Prior to Admission medications  Medication Sig Start Date End Date Taking? Authorizing Provider  baclofen (LIORESAL) 10 MG tablet Take 10 mg by mouth 3 (three) times daily.   Yes Historical Provider, MD  CALCIUM-MAGNESIUM PO Take 1 tablet by mouth daily.   Yes Historical Provider, MD  clonazePAM (KLONOPIN) 0.5 MG tablet Take 0.5 mg by mouth at bedtime as needed. For sleep   Yes Historical Provider, MD  dexamethasone (DECADRON) 4 MG tablet Take 2 tablets two times a day the day before Taxotere. Then take 2 tabs two times a day starting the day after chemo for 3 days. 12/30/11 12/29/12 Yes Victorino December, MD  DULoxetine (CYMBALTA) 60 MG capsule Take 60 mg by mouth daily.   Yes Historical Provider, MD  esomeprazole (NEXIUM) 40 MG capsule Take 40 mg by mouth daily before breakfast.   Yes Historical Provider, MD  gabapentin (NEURONTIN) 600 MG tablet Take 600 mg by mouth 3 (three) times daily.    Yes Historical Provider, MD  HYDROcodone-acetaminophen (NORCO) 10-325 MG per tablet Take 1 tablet by mouth every 8 (eight) hours as needed. As needed for pain. 12/15/11  Yes Historical Provider, MD  lidocaine-prilocaine (EMLA) cream Apply 1 application topically as needed. 01/18/12 01/17/13 Yes Victorino December, MD  LORazepam (ATIVAN) 0.5 MG tablet Take 0.5 mg by mouth every 8 (eight) hours. As needed for anxiety.   Yes Historical Provider, MD  losartan-hydrochlorothiazide (HYZAAR) 50-12.5 MG per tablet Take 1 tablet by mouth daily.   Yes Historical Provider, MD  Multiple Vitamins-Minerals (MULTIVITAMINS THER. W/MINERALS) TABS Take 1 tablet by mouth daily.   Yes Historical Provider, MD  ondansetron (ZOFRAN) 8 MG tablet Take 1 tablet two times a day starting the day after chemo for 3 days. Then take 1 tab two times a day as needed for nausea or vomiting. 12/30/11 12/29/12 Yes Victorino December, MD  prochlorperazine (COMPAZINE) 10 MG tablet Take 1 tablet (10 mg total) by mouth every 6 (six) hours as needed (Nausea or vomiting). 12/30/11 12/29/12 Yes Victorino December, MD  prochlorperazine (COMPAZINE) 25 MG suppository Place 1 suppository (25 mg total) rectally every 12 (twelve) hours as needed for nausea. 12/30/11 12/29/12 Yes Victorino December, MD  naproxen sodium (ANAPROX) 550 MG tablet Take 550 mg by mouth as needed.    Historical Provider, MD  PRESCRIPTION MEDICATION Chemotherapy regimen. Taxotere.    Historical Provider, MD  UNABLE TO FIND Cranial Prothesis 01/18/12   Victorino December, MD    Inpatient medications:    . docusate sodium  100 mg Oral BID  . DULoxetine  60 mg Oral Daily  . LORazepam  0.5 mg Oral Q8H  . multivitamin with minerals  1 tablet Oral Daily  . pantoprazole  80 mg Oral Q1200  . sodium chloride  500 mL Intravenous Once  . Warfarin - Pharmacist Dosing Inpatient   Does not apply q1800  . DISCONTD: baclofen  10 mg Oral TID  . DISCONTD: baclofen  5 mg Oral TID  . DISCONTD: gabapentin  600 mg  Oral TID  . DISCONTD: hydrochlorothiazide  12.5 mg Oral Daily  . DISCONTD: losartan  50 mg Oral Daily  . DISCONTD: vancomycin  1,000 mg Intravenous Q12H  . DISCONTD: warfarin  10 mg Oral ONCE-1800  . DISCONTD: warfarin   Does not apply Once    Labs: Basic Metabolic Panel:  Lab 03/04/12 1610 03/04/12 0908 03/03/12 0545 03/02/12 1402  NA 127* 125* 123* 130*  K 3.9 3.8 3.2* 3.6  CL 90* 89*  88* 92*  CO2 21 22 23 24   GLUCOSE 149* 163* 158* 143*  BUN 25* 21 13 12   CREATININE 3.78* 3.42* 1.18* 0.93  ALB -- -- -- --  CALCIUM 8.6 8.5 8.5 9.2  PHOS -- -- -- --   Liver Function Tests:  Lab 03/04/12 0908 03/02/12 1402  AST 29 45*  ALT 34 57*  ALKPHOS 419* 317*  BILITOT 1.0 2.0*  PROT 6.3 6.9  ALBUMIN 2.5* 3.0*   No results found for this basename: LIPASE:3,AMYLASE:3 in the last 168 hours No results found for this basename: AMMONIA:3 in the last 168 hours CBC:  Lab 03/04/12 1622 03/04/12 0440 03/03/12 0545 03/02/12 1403  WBC 6.8 6.3 7.3 7.4  NEUTROABS -- -- -- 6.6  HGB 7.9* 8.5* 7.5* 9.4*  HCT 23.3* 24.8* 22.4* 27.9*  MCV 80.3 81.0 79.7 80.2  PLT 81* 89* 89* 89*   PT/INR: @labrcntip (inr:5) Cardiac Enzymes: No results found for this basename: CKTOTAL:5,CKMB:5,CKMBINDEX:5,TROPONINI:5 in the last 168 hours CBG: No results found for this basename: GLUCAP:5 in the last 168 hours  Iron Studies: No results found for this basename: IRON:30,TIBC:30,TRANSFERRIN:30,FERRITIN:30 in the last 168 hours  Xrays/Other Studies: Dg Chest Port 1 View  03/04/2012  *RADIOLOGY REPORT*  Clinical Data: Bacteremia.  Infected knee prosthesis.  Acute renal insufficiency.  PORTABLE CHEST - 1 VIEW 03/04/2012 1309 hours:  Comparison: Portable chest x-ray 01/16/2012 and two-view chest x- ray 08/11/2011.  Findings: Suboptimal inspiration due to body habitus which accounts for crowded bronchovascular markings at the bases and accentuates the cardiac silhouette.  Taking this into account, cardiac silhouette  enlarged but stable.  Lungs clear.  Pulmonary venous hypertension without overt edema.  Right subclavian Port-A-Cath tip projects over the lower SVC.  IMPRESSION: Suboptimal inspiration.  Stable cardiomegaly.  No acute cardiopulmonary disease.   Original Report Authenticated By: Arnell Sieving, M.D.     Physical Exam:  Blood pressure 116/61, pulse 72, temperature 98.1 F (36.7 C), temperature source Oral, resp. rate 16, height 5\' 6"  (1.676 m), weight 88.451 kg (195 lb), last menstrual period 12/05/2011, SpO2 100.00%.  Gen: groggy, nad Skin: no rash, cyanosis HEENT:  EOMI, sclera anicteric, throat moist Neck: + JVD, no bruits or LAN Chest: clear bilat, no rales, good air movement Heart: regular, no rub or gallop, no murmur Abdomen: soft, nontender, nondistended, no ascites Ext: trace pretibial edema bilat Neuro: a bit lethargic but Ox3, no focal deficit, no asterixis  Blood cultures + for Staph Aureus Impression/Plan 1. Acute kidney injury, ATN due to vanc toxicity in conjunction with ARB/vol depletion, borderline low BP, bacteremia.  Vanc/ARB/HCT have been stopped. Making urine.  2. Staph aureus bacteremia 3. L prosthetic knee joint infection, s/p I&d 9/13 4. Hx HTN- bp's low, meds on hold 5. Breast Ca- on chemo 6. AMS- better. Stop scheduled ativan.   Rec- continue IVF's at 150/hr, d/w ID will start IV daptomycin in place of vanc. Supportive care. D/w patient, questions answered. Would hold off on coumadin until this episode of AKI has resolved, in case she needs dialysis, procedures, etc. Suggest using alternative short-term anticoagulant instead.    Vinson Moselle  MD [facility] [phone] pgr    [phone] cell 03/04/2012, 9:35 PM

## 2012-03-04 NOTE — Progress Notes (Signed)
Called by nursing that patient again has become less responsive and patient has had a drop in her BP.  Fluid bolus given with minimal response.  Consulted critical care.  Marlin Canary DO

## 2012-03-04 NOTE — Progress Notes (Signed)
ANTICOAGULATION CONSULT NOTE - Follow Up Consult  Pharmacy Consult for Warfarin Indication: VTE prophylaxis  No Known Allergies  Patient Measurements: Height: 5\' 6"  (167.6 cm) Weight: 195 lb (88.451 kg) IBW/kg (Calculated) : 59.3   Vital Signs: Temp: 98.7 F (37.1 C) (09/15 0633) Temp src: Axillary (09/15 0633) BP: 120/58 mmHg (09/15 0633) Pulse Rate: 68  (09/15 0633)  Labs:  Basename 03/04/12 0440 03/03/12 0545 03/02/12 1403 03/02/12 1402  HGB 8.5* 7.5* -- --  HCT 24.8* 22.4* 27.9* --  PLT 89* 89* 89* --  APTT -- -- 34 --  LABPROT 13.7 14.1 14.1 --  INR 1.03 1.07 1.07 --  HEPARINUNFRC -- -- -- --  CREATININE -- 1.18* -- 0.93  CKTOTAL -- -- -- --  CKMB -- -- -- --  TROPONINI -- -- -- --    Estimated Creatinine Clearance: 61.8 ml/min (by C-G formula based on Cr of 1.18).   Medications:  Scheduled:     . baclofen  10 mg Oral TID  . docusate sodium  100 mg Oral BID  . DULoxetine  60 mg Oral Daily  . gabapentin  600 mg Oral TID  . hydrochlorothiazide  12.5 mg Oral Daily  . LORazepam  0.5 mg Oral Q8H  . losartan  50 mg Oral Daily  . multivitamin with minerals  1 tablet Oral Daily  . pantoprazole  80 mg Oral Q1200  . vancomycin  1,000 mg Intravenous Q12H  . warfarin  7.5 mg Oral ONCE-1800  . warfarin   Does not apply Once  . Warfarin - Pharmacist Dosing Inpatient   Does not apply q1800    Assessment: Pt is a 33 YOF s/p I&D of infected prosthetic knee joint. She is on coumadin day 3 for VTE px.  LFTs are mildy elevated, INR today is 1.03 (from 1.07), H/H 8.5/24.8 s/p 1 units PRBC on 9/14, Plts 89<89, no evidence of bleeding reported.  Goal of Therapy:  INR goal of 2 per MD Monitor platelets by anticoagulation protocol: Yes   Plan:  -Warfarin 10mg  PO x 1 at 1800 -Daily PT/INR -Monitor H/H/Plts closely -Monitor for signs of active bleeding  Abran Duke, PharmD Clinical Pharmacist Phone: [phone] Pager: [phone] 03/04/2012 8:11 AM

## 2012-03-04 NOTE — Progress Notes (Signed)
ANTIBIOTIC CONSULT NOTE - INITIAL  Pharmacy Consult for Vancomycin Indication: osteoarthritis, septic arthritis, bacteremia  No Known Allergies  Patient Measurements: Height: 5\' 6"  (167.6 cm) Weight: 195 lb (88.451 kg) IBW/kg (Calculated) : 59.3  Adjusted Body Weight:   Vital Signs: Temp: 98.3 F (36.8 C) (09/15 1542) Temp src: Oral (09/15 1542) BP: 62/40 mmHg (09/15 1545) Pulse Rate: 72  (09/15 1545) Intake/Output from previous day: 09/14 0701 - 09/15 0700 In: 650 [P.O.:600; I.V.:50] Out: 1175 [Urine:1115; Drains:60] Intake/Output from this shift:    Labs:  Basename 03/04/12 1622 03/04/12 1503 03/04/12 0908 03/04/12 0440 03/03/12 0545  WBC 6.8 -- -- 6.3 7.3  HGB 7.9* -- -- 8.5* 7.5*  PLT 81* -- -- 89* 89*  LABCREA -- -- -- -- --  CREATININE -- 3.78* 3.42* -- 1.18*   Estimated Creatinine Clearance: 19.3 ml/min (by C-G formula based on Cr of 3.78).  Basename 03/04/12 1503  VANCOTROUGH 32.7*  VANCOPEAK --  Drue Dun --  GENTTROUGH --  GENTPEAK --  GENTRANDOM --  TOBRATROUGH --  TOBRAPEAK --  TOBRARND --  AMIKACINPEAK --  AMIKACINTROU --  AMIKACIN --     Microbiology: Recent Results (from the past 720 hour(s))  SURGICAL PCR SCREEN     Status: Abnormal   Collection Time   03/02/12  1:35 PM      Component Value Range Status Comment   MRSA, PCR NEGATIVE  NEGATIVE Final    Staphylococcus aureus POSITIVE (*) NEGATIVE Final   ANAEROBIC CULTURE     Status: Normal (Preliminary result)   Collection Time   03/02/12  4:20 PM      Component Value Range Status Comment   Specimen Description SYNOVIAL FLUID KNEE LEFT   Final    Special Requests RECEIVED ON SWAB   Final    Gram Stain     Final    Value: MODERATE WBC PRESENT, PREDOMINANTLY PMN     RARE GRAM POSITIVE COCCI     IN PAIRS Gram Stain Report Called to,Read Back By and Verified With: Gram Stain Report Called to,Read Back By and Verified With: DR GRAVES AT 1652 03/02/12 BY ROMEROJ Performed at [facility]   Culture     Final    Value: NO ANAEROBES ISOLATED; CULTURE IN PROGRESS FOR 5 DAYS   Report Status PENDING   Incomplete   BODY FLUID CULTURE     Status: Normal   Collection Time   03/02/12  4:20 PM      Component Value Range Status Comment   Specimen Description SYNOVIAL FLUID KNEE LEFT   Final    Special Requests RECEIVED ON SWAB   Final    Gram Stain     Final    Value: MODERATE WBC PRESENT, PREDOMINANTLY PMN     RARE GRAM POSITIVE COCCI     IN PAIRS Gram Stain Report Called to,Read Back By and Verified With: Gram Stain Report Called to,Read Back By and Verified With: DR GRAVES AT 1652 03/02/12 BY ROMEROJ Performed at [facility]   Culture     Final    Value: MODERATE STAPHYLOCOCCUS AUREUS     Note: RIFAMPIN AND GENTAMICIN SHOULD NOT BE USED AS SINGLE DRUGS FOR TREATMENT OF STAPH INFECTIONS.   Report Status 03/04/2012 FINAL   Final    Organism ID, Bacteria STAPHYLOCOCCUS AUREUS   Final   GRAM STAIN     Status: Normal   Collection Time   03/02/12  4:20 PM  Component Value Range Status Comment   Specimen Description SYNOVIAL FLUID KNEE LEFT   Final    Special Requests NONE   Final    Gram Stain     Final    Value: MODERATE WBC PRESENT, PREDOMINANTLY PMN     RARE GRAM POSITIVE COCCI IN PAIRS     CALLED TO DR. Luiz Blare 9.13.13 AT 1652 BY ROMEROJ   Report Status 03/02/2012 FINAL   Final   CULTURE, BLOOD (ROUTINE X 2)     Status: Normal (Preliminary result)   Collection Time   03/03/12 12:25 AM      Component Value Range Status Comment   Specimen Description BLOOD RIGHT HAND   Final    Special Requests BOTTLES DRAWN AEROBIC AND ANAEROBIC 5CC EACH   Final    Culture  Setup Time 03/03/2012 11:01   Final    Culture     Final    Value: GRAM POSITIVE COCCI IN CLUSTERS     Note: Gram Stain Report Called to,Read Back By and Verified With: ERIN ARNOLD 03/04/12 @ 11:30AM BY RUSCA.   Report Status PENDING   Incomplete   CULTURE, BLOOD (ROUTINE X 2)     Status: Normal  (Preliminary result)   Collection Time   03/03/12 12:40 AM      Component Value Range Status Comment   Specimen Description BLOOD RIGHT HAND   Final    Special Requests BOTTLES DRAWN AEROBIC AND ANAEROBIC [facility] EACH   Final    Culture  Setup Time 03/03/2012 11:01   Final    Culture     Final    Value: GRAM POSITIVE COCCI IN CLUSTERS     Note: Gram Stain Report Called to,Read Back By and Verified With: ERIN ARNOLD 03/04/12 @ 11:30AM BY RUSCA.   Report Status PENDING   Incomplete     Medical History: Past Medical History  Diagnosis Date  . Hypertension   . Hypothyroidism   . Arthritis   . Neuromuscular disorder     MS TX WITH CYMBALTA   . Multiple sclerosis   . H/O colonoscopy   . H/O bone density study 03/2011  . Wears glasses   . Heart burn   . Depression   . Sleep apnea     MILD SLEEP APNEA, NO MACHINE  6 YRS AGO HPT [facility]   . Breast cancer     lt breast    Medications:  Scheduled:     . baclofen  5 mg Oral TID  . docusate sodium  100 mg Oral BID  . DULoxetine  60 mg Oral Daily  . gabapentin  600 mg Oral TID  . LORazepam  0.5 mg Oral Q8H  . multivitamin with minerals  1 tablet Oral Daily  . pantoprazole  80 mg Oral Q1200  . sodium chloride  500 mL Intravenous Once  . warfarin  10 mg Oral ONCE-1800  . warfarin  7.5 mg Oral ONCE-1800  . warfarin   Does not apply Once  . Warfarin - Pharmacist Dosing Inpatient   Does not apply q1800  . DISCONTD: baclofen  10 mg Oral TID  . DISCONTD: hydrochlorothiazide  12.5 mg Oral Daily  . DISCONTD: losartan  50 mg Oral Daily  . DISCONTD: vancomycin  1,000 mg Intravenous Q12H   Assessment: 51yo female with septic arthritis, osteoarthritis s/p I&D with gram stain of synovial fluid (+)GPC-pairs.  Pt now also has positive cultures for likely staph aureus. Her scr has gone way up so  a level was drawn today and it's 32.7. Will hold vanc and recheck leve in AM  Goal of Therapy:  Vancomycin trough level 15-20 mcg/ml  Plan:  1.   Hold vanc 2.  Check random level in AM

## 2012-03-04 NOTE — Progress Notes (Signed)
Pt lethargic and unable to stay alert enough to answer questions.  Vital signs obtained and WNL.  Mauricia Area PA paged regarding status of patient.  Stat CMP drawn and revealed Cr 3.42 up from 1.18, Alk phos 419, hyponatremia 125.  Patient was started on NS 9% @100  cc/hr.  All medications are currently on hold.  Medicine service consulted to see patient and address acute kidney failure, positive BC of gram +cocci/clusters. Patient being monitored frequently for status changes.  Olesya Wike N

## 2012-03-04 NOTE — Progress Notes (Signed)
Physical Therapy Evaluation Patient Details Name: Julie Ross MRN: [id number] DOB: 1961/04/23 Today's Date: 03/04/2012 Time: 4098-1191 PT Time Calculation (min): 30 min  PT Assessment / Plan / Recommendation Clinical Impression  51 yo female s/p L knee I&D with extensive synovectomy secondary to infection, also with breast Ca; Presents with decr functional mobility, decr level of arousal secondary to dehydration, bacteremia, electrolyte imbalance; Will benefit from acute PT to work to maximize independence and safety with mobility, and to facilitate dc planning    PT Assessment  Patient needs continued PT services    Follow Up Recommendations  Inpatient Rehab    Barriers to Discharge Inaccessible home environment (bed/bath upstairs)      Equipment Recommendations  Defer to next venue    Recommendations for Other Services Rehab consult   Frequency Min 5X/week    Precautions / Restrictions Precautions Precautions: Fall Required Braces or Orthoses: Knee Immobilizer - Left Knee Immobilizer - Left: On at all times Restrictions LLE Weight Bearing: Weight bearing as tolerated Other Position/Activity Restrictions: No ROM    Pertinent Vitals/Pain Extreme pain with pt yelling while transitioning from supine to sit      Mobility  Bed Mobility Bed Mobility: Rolling Right;Rolling Left;Supine to Sit;Sitting - Scoot to [place] of Bed;Sit to Supine Rolling Right: 1: +2 Total assist Rolling Right: Patient Percentage: 30% Rolling Left: 1: +2 Total assist Rolling Left: Patient Percentage: 30% Supine to Sit: 1: +2 Total assist Supine to Sit: Patient Percentage: 20% Sitting - Scoot to Edge of Bed: 2: Max assist Sit to Supine: 1: +2 Total assist Sit to Supine: Patient Percentage: 30% Details for Bed Mobility Assistance: Max step-by-step cues for technique; Noted quite good half-bridge to EOB with RLE; Very painful with movement of LLE Transfers Transfers: Lateral/Scoot  Transfers Lateral/Scoot Transfers: 1: +2 Total assist Lateral Transfers: Patient Percentage: 30% Details for Transfer Assistance: +2 assist to lateral scoot to EOB with R knee blocked; Used bed pad to scoot hips Ambulation/Gait Ambulation/Gait Assistance: Not tested (comment)    Exercises     PT Diagnosis: Difficulty walking;Acute pain  PT Problem List: Decreased strength;Decreased range of motion;Decreased activity tolerance;Decreased balance;Decreased coordination;Decreased mobility;Decreased cognition;Decreased knowledge of use of DME;Decreased knowledge of precautions;Pain PT Treatment Interventions: DME instruction;Gait training;Stair training;Functional mobility training;Therapeutic activities;Therapeutic exercise;Balance training;Patient/family education;Cognitive remediation   PT Goals Acute Rehab PT Goals PT Goal Formulation: With patient/family Time For Goal Achievement: 03/18/12 Potential to Achieve Goals: Fair Pt will go Supine/Side to Sit: with supervision PT Goal: Supine/Side to Sit - Progress: Goal set today Pt will go Sit to Supine/Side: with supervision PT Goal: Sit to Supine/Side - Progress: Goal set today Pt will go Sit to Stand: with supervision PT Goal: Sit to Stand - Progress: Goal set today Pt will go Stand to Sit: with supervision PT Goal: Stand to Sit - Progress: Goal set today Pt will Stand: with supervision;6 - 10 min;with bilateral upper extremity support PT Goal: Stand - Progress: Goal set today Pt will Ambulate: >150 feet;with supervision;with rolling walker PT Goal: Ambulate - Progress: Goal set today Pt will Go Up / Down Stairs: 1-2 stairs;with min assist;with rolling walker PT Goal: Up/Down Stairs - Progress: Goal set today  Visit Information  Last PT Received On: 03/04/12 Assistance Needed: +2    Subjective Data  Subjective: Still lethargic, but opens eyes and able to answer questions Patient Stated Goal: did not state   Prior Functioning   Home Living Lives With: Spouse Available Help at Discharge: Family;Available 24  hours/day (will be able to take a week off when pt dc's) Type of Home: House Home Access: Stairs to enter Entrance Stairs-Number of Steps: 1 Home Layout: Two level;Bed/bath upstairs;1/2 bath on main level (has slept on couch of main level before) Alternate Level Stairs-Number of Steps: 12 Alternate Level Stairs-Rails:  (to be determined) Home Adaptive Equipment: Walker - rolling;Bedside commode/3-in-1 Prior Function Level of Independence: Independent Able to Take Stairs?: Yes Driving: Yes Communication Communication: Other (comment) (somewhat lethargic and slow to answer questions)    Cognition  Overall Cognitive Status: Difficult to assess Difficult to assess due to: Level of arousal Arousal/Alertness: Lethargic Orientation Level: Other (comment) (difficult to assess) Behavior During Session: Restless    Extremity/Trunk Assessment Right Upper Extremity Assessment RUE ROM/Strength/Tone: WFL for tasks assessed Left Upper Extremity Assessment LUE ROM/Strength/Tone: WFL for tasks assessed Right Lower Extremity Assessment RLE ROM/Strength/Tone: Unable to fully assess (EOB only on eval) Left Lower Extremity Assessment LLE ROM/Strength/Tone: Deficits;Due to pain LLE ROM/Strength/Tone Deficits: In KI; Pain limiting all motion   Balance Balance Balance Assessed: Yes Static Sitting Balance Static Sitting - Balance Support: Right upper extremity supported;Left upper extremity supported Static Sitting - Level of Assistance: 3: Mod assist Static Sitting - Comment/# of Minutes: EOB apprx 5 minutes with mod assist; Pt reporting feeling nauseated, and like she may pass out (Dinamap not available for BP) -- Still, pt able to talk while sitting up; made a few attempts to lay back down  End of Session PT - End of Session Equipment Utilized During Treatment: Left knee immobilizer (bed pad) Activity Tolerance:  Patient limited by fatigue;Treatment limited secondary to medical complications (Comment) Patient left: in bed;with call bell/phone within reach;with family/visitor present (to transfer to step-down) Nurse Communication: Mobility status  GP     Van Clines [facility] [place], [place] [phone]  03/04/2012, 5:28 PM

## 2012-03-05 ENCOUNTER — Inpatient Hospital Stay (HOSPITAL_COMMUNITY): Payer: BC Managed Care – PPO

## 2012-03-05 ENCOUNTER — Encounter (HOSPITAL_COMMUNITY): Payer: Self-pay | Admitting: Radiology

## 2012-03-05 DIAGNOSIS — Z96659 Presence of unspecified artificial knee joint: Secondary | ICD-10-CM

## 2012-03-05 DIAGNOSIS — R7881 Bacteremia: Secondary | ICD-10-CM

## 2012-03-05 DIAGNOSIS — I369 Nonrheumatic tricuspid valve disorder, unspecified: Secondary | ICD-10-CM

## 2012-03-05 DIAGNOSIS — Y831 Surgical operation with implant of artificial internal device as the cause of abnormal reaction of the patient, or of later complication, without mention of misadventure at the time of the procedure: Secondary | ICD-10-CM

## 2012-03-05 DIAGNOSIS — T8450XA Infection and inflammatory reaction due to unspecified internal joint prosthesis, initial encounter: Principal | ICD-10-CM

## 2012-03-05 LAB — COMPREHENSIVE METABOLIC PANEL
ALT: 34 U/L (ref 0–35)
AST: 32 U/L (ref 0–37)
Albumin: 2.6 g/dL — ABNORMAL LOW (ref 3.5–5.2)
Alkaline Phosphatase: 549 U/L — ABNORMAL HIGH (ref 39–117)
BUN: 35 mg/dL — ABNORMAL HIGH (ref 6–23)
CO2: 21 mEq/L (ref 19–32)
Calcium: 9 mg/dL (ref 8.4–10.5)
Chloride: 91 mEq/L — ABNORMAL LOW (ref 96–112)
Creatinine, Ser: 4.25 mg/dL — ABNORMAL HIGH (ref 0.50–1.10)
GFR calc Af Amer: 13 mL/min — ABNORMAL LOW (ref >90)
GFR calc non Af Amer: 11 mL/min — ABNORMAL LOW (ref >90)
Glucose, Bld: 110 mg/dL — ABNORMAL HIGH (ref 70–99)
Potassium: 3.7 mEq/L (ref 3.5–5.1)
Sodium: 129 mEq/L — ABNORMAL LOW (ref 135–145)
Total Bilirubin: 0.6 mg/dL (ref 0.3–1.2)
Total Protein: 6.9 g/dL (ref 6.0–8.3)

## 2012-03-05 LAB — URINE CULTURE
Colony Count: NO GROWTH
Culture: NO GROWTH

## 2012-03-05 LAB — TYPE AND SCREEN
ABO/RH(D): O POS
ABO/RH(D): O POS
Antibody Screen: NEGATIVE
Antibody Screen: NEGATIVE
Unit division: 0
Unit division: 0

## 2012-03-05 LAB — CBC
HCT: 26.4 % — ABNORMAL LOW (ref 36.0–46.0)
Hemoglobin: 9.1 g/dL — ABNORMAL LOW (ref 12.0–15.0)
MCH: 28.3 pg (ref 26.0–34.0)
MCHC: 34.5 g/dL (ref 30.0–36.0)
MCV: 82.2 fL (ref 78.0–100.0)
Platelets: 90 10*3/uL — ABNORMAL LOW (ref 150–400)
RBC: 3.21 MIL/uL — ABNORMAL LOW (ref 3.87–5.11)
RDW: 18 % — ABNORMAL HIGH (ref 11.5–15.5)
WBC: 5.9 10*3/uL (ref 4.0–10.5)

## 2012-03-05 LAB — CBC WITH DIFFERENTIAL/PLATELET
Basophils Absolute: 0 10*3/uL (ref 0.0–0.1)
Basophils Relative: 0 % (ref 0–1)
Eosinophils Absolute: 0 10*3/uL (ref 0.0–0.7)
Eosinophils Relative: 0 % (ref 0–5)
HCT: 27.8 % — ABNORMAL LOW (ref 36.0–46.0)
Hemoglobin: 9.5 g/dL — ABNORMAL LOW (ref 12.0–15.0)
Lymphocytes Relative: 22 % (ref 12–46)
Lymphs Abs: 1.1 10*3/uL (ref 0.7–4.0)
MCH: 27.9 pg (ref 26.0–34.0)
MCHC: 34.2 g/dL (ref 30.0–36.0)
MCV: 81.8 fL (ref 78.0–100.0)
Monocytes Absolute: 0.3 10*3/uL (ref 0.1–1.0)
Monocytes Relative: 7 % (ref 3–12)
Neutro Abs: 3.5 10*3/uL (ref 1.7–7.7)
Neutrophils Relative %: 71 % (ref 43–77)
Platelets: 103 10*3/uL — ABNORMAL LOW (ref 150–400)
RBC: 3.4 MIL/uL — ABNORMAL LOW (ref 3.87–5.11)
RDW: 17.8 % — ABNORMAL HIGH (ref 11.5–15.5)
WBC: 5 10*3/uL (ref 4.0–10.5)

## 2012-03-05 LAB — VANCOMYCIN, RANDOM: Vancomycin Rm: 29.5 ug/mL

## 2012-03-05 LAB — APTT: aPTT: 33 seconds (ref 24–37)

## 2012-03-05 LAB — RENAL FUNCTION PANEL
Albumin: 2.4 g/dL — ABNORMAL LOW (ref 3.5–5.2)
BUN: 30 mg/dL — ABNORMAL HIGH (ref 6–23)
CO2: 20 mEq/L (ref 19–32)
Calcium: 8.7 mg/dL (ref 8.4–10.5)
Chloride: 92 mEq/L — ABNORMAL LOW (ref 96–112)
Creatinine, Ser: 4.1 mg/dL — ABNORMAL HIGH (ref 0.50–1.10)
GFR calc Af Amer: 13 mL/min — ABNORMAL LOW (ref >90)
GFR calc non Af Amer: 11 mL/min — ABNORMAL LOW (ref >90)
Glucose, Bld: 138 mg/dL — ABNORMAL HIGH (ref 70–99)
Phosphorus: 7.7 mg/dL — ABNORMAL HIGH (ref 2.3–4.6)
Potassium: 3.8 mEq/L (ref 3.5–5.1)
Sodium: 128 mEq/L — ABNORMAL LOW (ref 135–145)

## 2012-03-05 LAB — OSMOLALITY, URINE: Osmolality, Ur: 267 mOsm/kg — ABNORMAL LOW (ref 390–1090)

## 2012-03-05 LAB — PROTIME-INR
INR: 1.09 (ref 0.00–1.49)
INR: 1.12 (ref 0.00–1.49)
Prothrombin Time: 14.3 seconds (ref 11.6–15.2)
Prothrombin Time: 14.6 seconds (ref 11.6–15.2)

## 2012-03-05 MED ORDER — RIFAMPIN 300 MG PO CAPS
300.0000 mg | ORAL_CAPSULE | Freq: Every day | ORAL | Status: DC
  Administered 2012-03-05 – 2012-03-08 (×4): 300 mg via ORAL
  Filled 2012-03-05 (×4): qty 1

## 2012-03-05 MED ORDER — SODIUM CHLORIDE 0.9 % IV SOLN: 500.0000 mg | INTRAVENOUS | Status: DC

## 2012-03-05 MED ORDER — POVIDONE-IODINE 7.5 % EX SOLN
Freq: Once | CUTANEOUS | Status: AC
  Filled 2012-03-05: qty 118

## 2012-03-05 MED ORDER — SODIUM CHLORIDE 0.9 % IV SOLN
500.0000 mg | INTRAVENOUS | Status: DC
  Filled 2012-03-05: qty 10

## 2012-03-05 NOTE — Progress Notes (Signed)
INFECTIOUS DISEASE PROGRESS NOTE  ID: Julie Ross is a 51 y.o. female with   Principal Problem:  *Infected prosthetic knee joint Active Problems:  Cancer of upper-outer quadrant of female breast  Dehydration  Hypokalemia  Hyponatremia  Bacteremia  Subjective: Without complaints, "i feel fine"  Abtx:  Anti-infectives     Start     Dose/Rate Route Frequency Ordered Stop   03/06/12 2300   DAPTOmycin (CUBICIN) 500 mg in sodium chloride 0.9 % IVPB        500 mg 220 mL/hr over 30 Minutes Intravenous Every 24 hours 03/05/12 0856     03/06/12 1200   DAPTOmycin (CUBICIN) 500 mg in sodium chloride 0.9 % IVPB  Status:  Discontinued        500 mg 220 mL/hr over 30 Minutes Intravenous Every 24 hours 03/05/12 0850 03/05/12 0856   03/03/12 0400   vancomycin (VANCOCIN) IVPB 1000 mg/200 mL premix  Status:  Discontinued        1,000 mg 200 mL/hr over 60 Minutes Intravenous Every 12 hours 03/02/12 1946 03/04/12 1134   03/02/12 1656   polymyxin B 500,000 Units, bacitracin 50,000 Units in sodium chloride irrigation 0.9 % 500 mL irrigation  Status:  Discontinued          As needed 03/02/12 1656 03/02/12 1741          Medications:  Scheduled:   . DAPTOmycin (CUBICIN)  IV  500 mg Intravenous Q24H  . docusate sodium  100 mg Oral BID  . DULoxetine  60 mg Oral Daily  . multivitamin with minerals  1 tablet Oral Daily  . pantoprazole  80 mg Oral Q1200  . povidone-iodine   Topical Once  . sodium chloride  500 mL Intravenous Once  . Warfarin - Pharmacist Dosing Inpatient   Does not apply q1800  . DISCONTD: baclofen  10 mg Oral TID  . DISCONTD: baclofen  5 mg Oral TID  . DISCONTD: DAPTOmycin (CUBICIN)  IV  500 mg Intravenous Q24H  . DISCONTD: gabapentin  600 mg Oral TID  . DISCONTD: hydrochlorothiazide  12.5 mg Oral Daily  . DISCONTD: LORazepam  0.5 mg Oral Q8H  . DISCONTD: losartan  50 mg Oral Daily  . DISCONTD: vancomycin  1,000 mg Intravenous Q12H  . DISCONTD: warfarin  10 mg Oral  ONCE-1800  . DISCONTD: warfarin   Does not apply Once    Objective: Vital signs in last 24 hours: Temp:  [97.4 F (36.3 C)-98.4 F (36.9 C)] 98 F (36.7 C) (09/16 0801) Pulse Rate:  [63-81] 72  (09/16 0801) Resp:  [4-18] 15  (09/16 0400) BP: (62-147)/(40-77) 113/66 mmHg (09/16 0802) SpO2:  [96 %-100 %] 96 % (09/16 0802) FiO2 (%):  [2 %] 2 % (09/15 1300)   General appearance: alert, cooperative and no distress Resp: clear to auscultation bilaterally Chest wall: no tenderness, port site is firm, non-tender, no erythema. no fluctuance.  Cardio: regular rate and rhythm GI: normal findings: bowel sounds normal and soft, non-tender Extremities: LLE wrapped  Lab Results  Basename 03/05/12 0021 03/05/12 0016 03/04/12 1622 03/04/12 1503  WBC 5.9 -- 6.8 --  HGB 9.1* -- 7.9* --  HCT 26.4* -- 23.3* --  NA -- 128* -- 127*  K -- 3.8 -- 3.9  CL -- 92* -- 90*  CO2 -- 20 -- 21  BUN -- 30* -- 25*  CREATININE -- 4.10* -- 3.78*  GLU -- -- -- --   Liver Panel  Basename 03/05/12  0016 03/04/12 0908 03/02/12 1402  PROT -- 6.3 6.9  ALBUMIN 2.4* 2.5* --  AST -- 29 45*  ALT -- 34 57*  ALKPHOS -- 419* 317*  BILITOT -- 1.0 2.0*  BILIDIR -- -- --  IBILI -- -- --   Sedimentation Rate  Basename 03/02/12 2027  ESRSEDRATE 72*   C-Reactive Protein  Basename 03/02/12 2027  CRP 26.2*    Microbiology: Recent Results (from the past 240 hour(s))  SURGICAL PCR SCREEN     Status: Abnormal   Collection Time   03/02/12  1:35 PM      Component Value Range Status Comment   MRSA, PCR NEGATIVE  NEGATIVE Final    Staphylococcus aureus POSITIVE (*) NEGATIVE Final   ANAEROBIC CULTURE     Status: Normal (Preliminary result)   Collection Time   03/02/12  4:20 PM      Component Value Range Status Comment   Specimen Description SYNOVIAL FLUID KNEE LEFT   Final    Special Requests RECEIVED ON SWAB   Final    Gram Stain     Final    Value: MODERATE WBC PRESENT, PREDOMINANTLY PMN     RARE GRAM  POSITIVE COCCI     IN PAIRS Gram Stain Report Called to,Read Back By and Verified With: Gram Stain Report Called to,Read Back By and Verified With: DR GRAVES AT 1652 03/02/12 BY ROMEROJ Performed at [facility]   Culture     Final    Value: NO ANAEROBES ISOLATED; CULTURE IN PROGRESS FOR 5 DAYS   Report Status PENDING   Incomplete   BODY FLUID CULTURE     Status: Normal   Collection Time   03/02/12  4:20 PM      Component Value Range Status Comment   Specimen Description SYNOVIAL FLUID KNEE LEFT   Final    Special Requests RECEIVED ON SWAB   Final    Gram Stain     Final    Value: MODERATE WBC PRESENT, PREDOMINANTLY PMN     RARE GRAM POSITIVE COCCI     IN PAIRS Gram Stain Report Called to,Read Back By and Verified With: Gram Stain Report Called to,Read Back By and Verified With: DR GRAVES AT 1652 03/02/12 BY ROMEROJ Performed at [facility]   Culture     Final    Value: MODERATE STAPHYLOCOCCUS AUREUS     Note: RIFAMPIN AND GENTAMICIN SHOULD NOT BE USED AS SINGLE DRUGS FOR TREATMENT OF STAPH INFECTIONS.   Report Status 03/04/2012 FINAL   Final    Organism ID, Bacteria STAPHYLOCOCCUS AUREUS   Final   GRAM STAIN     Status: Normal   Collection Time   03/02/12  4:20 PM      Component Value Range Status Comment   Specimen Description SYNOVIAL FLUID KNEE LEFT   Final    Special Requests NONE   Final    Gram Stain     Final    Value: MODERATE WBC PRESENT, PREDOMINANTLY PMN     RARE GRAM POSITIVE COCCI IN PAIRS     CALLED TO DR. Luiz Blare 9.13.13 AT 1652 BY ROMEROJ   Report Status 03/02/2012 FINAL   Final   CULTURE, BLOOD (ROUTINE X 2)     Status: Normal (Preliminary result)   Collection Time   03/03/12 12:25 AM      Component Value Range Status Comment   Specimen Description BLOOD RIGHT HAND   Final    Special Requests BOTTLES DRAWN AEROBIC  AND ANAEROBIC 5CC EACH   Final    Culture  Setup Time 03/03/2012 11:01   Final    Culture     Final    Value: GRAM POSITIVE COCCI IN  CLUSTERS     Note: Gram Stain Report Called to,Read Back By and Verified With: ERIN ARNOLD 03/04/12 @ 11:30AM BY RUSCA.   Report Status PENDING   Incomplete   CULTURE, BLOOD (ROUTINE X 2)     Status: Normal (Preliminary result)   Collection Time   03/03/12 12:40 AM      Component Value Range Status Comment   Specimen Description BLOOD RIGHT HAND   Final    Special Requests BOTTLES DRAWN AEROBIC AND ANAEROBIC [facility] EACH   Final    Culture  Setup Time 03/03/2012 11:01   Final    Culture     Final    Value: GRAM POSITIVE COCCI IN CLUSTERS     Note: Gram Stain Report Called to,Read Back By and Verified With: ERIN ARNOLD 03/04/12 @ 11:30AM BY RUSCA.   Report Status PENDING   Incomplete     Studies/Results: Ct Head Wo Contrast  03/05/2012  *RADIOLOGY REPORT*  Clinical Data:  Altered mental status.  Breast cancer.  CT HEAD WITHOUT CONTRAST  Technique:  Contiguous axial images were obtained from the base of the skull through the vertex without contrast.  Comparison: None.  Findings: There is no evidence for acute infarction, intracranial hemorrhage, mass lesion, hydrocephalus, or extra-axial fluid. Borderline mild atrophy.  No definite white matter disease.  No osseous destructive lesion.  Sinuses and mastoids clear.  IMPRESSION: Borderline mild atrophy.  No acute intracranial findings.   Original Report Authenticated By: Elsie Stain, M.D.    Dg Chest Port 1 View  03/04/2012  *RADIOLOGY REPORT*  Clinical Data: Bacteremia.  Infected knee prosthesis.  Acute renal insufficiency.  PORTABLE CHEST - 1 VIEW 03/04/2012 1309 hours:  Comparison: Portable chest x-ray 01/16/2012 and two-view chest x- ray 08/11/2011.  Findings: Suboptimal inspiration due to body habitus which accounts for crowded bronchovascular markings at the bases and accentuates the cardiac silhouette.  Taking this into account, cardiac silhouette enlarged but stable.  Lungs clear.  Pulmonary venous hypertension without overt edema.  Right  subclavian Port-A-Cath tip projects over the lower SVC.  IMPRESSION: Suboptimal inspiration.  Stable cardiomegaly.  No acute cardiopulmonary disease.   Original Report Authenticated By: Arnell Sieving, M.D.      Assessment/Plan: Septic Arthritis/Osteomyelitis  Bacteremia 2/2 GPC ARF L TKR  Breast Cancer (L) T1 N1 (stage II)  MS  Total days of antibiotics 4 (vancomycin --> cubicin)    Would- Consider removing port Consider TEE Watch Cr Change cubicin to ancef when we have BCx sensi Add Rifampin, prev LFTs normal. Let pt know of "red" side effects.  Watch coumadin/rifampin interactions       Johny Sax Infectious Diseases [phone] 03/05/2012, 9:06 AM   LOS: 3 days

## 2012-03-05 NOTE — Progress Notes (Addendum)
[facility] Consult F/U Note [place] TEAM 1 - Stepdown/ICU TEAM   Julie Ross ZOX:[id number] DOB: [birth year] DOA: 03/02/2012 PCP: [name]  Brief narrative: 51 yo female who was admitted by Ortho with knee pain. She presented w/ an acutely swollen and painful knee. She subsequently underwent an extensive 4-compartment synovectomy with irrigation and debridement of subcutaneous tissue, fascia, muscle, and bone. After her surgery, she became less responsive on the floor, had some lab values that were not normal, and [facility] was consulted to help manage patient medically.   Assessment/Plan:  Staph aureus bacteremia + septic arthritis - r/o endocarditits Has a port-a-cath - i have called Gen Surg to see the patient in order to have her port-a-cath removed - TTE was w/o evidence of vegetation, therefore a TEE will be required - I have asked [facility] to see her (they read her TTE) in order to schedule a TEE - will need PICC for long term abx tx but will defer to ID as to the most appropriate time to place this (pt has functional periph IV at this time)   Infected L TKR (original surgery Feb 2013) As per Ortho  Hypotension Resolved w/ volume - follow trend  thrombocytopenia Improving with tx of infection and in absence of chemo - follow trend  Acute kidney failure ATN due to vanc toxicity in conjunction with ARB/vol depletion - Nephrology is following   Hyponatremia Improving w/ volume expansion  Hypokalemia Improved w/ replacement   Anemia hgb stable/climbing  Breast CA undergoing chemo I spoke w/ Dr. Park Breed who recommends holding all tx at this time (including herceptin) - pt to f/u with her once d/c from hospital  Toxic metabolic encephalopathy Likely due to hypovolemia/hypoperfusion + acute renal failure + bactermia - MS improving this morning  Multiple Sclerosis Well compensated at present - follow clinically   Code Status: Full Disposition Plan: reamin  in SDU an additional 24hrs - consider transfer in AM if BP remains stable   Consultants: [facility] ID Nephrology  Procedures: 03/05/2012 - TTE - no evidence of vegitation - preserved LV fxn 03/02/2012 - Revision total knee, 1 component - Extensive 4-compartment synovectomy with irrigation and debridement  of subcutaneous tissue, fascia, muscle, and bone  Antibiotics: daptomycin 03/06/2012>> Rifampin 03/05/2012>>  DVT prophylaxis: SCDs only at present - for OR tomorrow (removal of port-a-cath)  HPI/Subjective: The patient is sitting up in bed comfortably at the present time he complains only of left knee pain.  She denies shortness of breath fevers chills nausea vomiting or chest pain.  The patient is alert and interactive and the family states that her mental status is much improved compared to yesterday.   Objective: Blood pressure 113/66, pulse 72, temperature 98 F (36.7 C), temperature source Oral, resp. rate 10, height 5\' 6"  (1.676 m), weight 88.451 kg (195 lb), last menstrual period 12/05/2011, SpO2 96.00%.  Intake/Output Summary (Last 24 hours) at 03/05/12 1107 Last data filed at 03/05/12 0900  Gross per 24 hour  Intake 1542.08 ml  Output    105 ml  Net 1437.08 ml     Exam: General: No acute respiratory distress at rest Lungs: Clear to auscultation bilaterally without wheezes or crackles Cardiovascular: Regular rate and rhythm without murmur gallop or rub normal S1 and S2 Abdomen: Nontender, nondistended, soft, bowel sounds positive, no rebound, no ascites, no appreciable mass Extremities: No significant cyanosis, clubbing, or edema bilateral lower extremities  Data Reviewed: Basic Metabolic Panel:  Lab 03/05/12 8119 03/05/12  0016 03/04/12 1503 03/04/12 0908 03/03/12 0545  NA 129* 128* 127* 125* 123*  K 3.7 3.8 3.9 3.8 3.2*  CL 91* 92* 90* 89* 88*  CO2 21 20 21 22 23   GLUCOSE 110* 138* 149* 163* 158*  BUN 35* 30* 25* 21 13  CREATININE 4.25* 4.10* 3.78* 3.42* 1.18*    CALCIUM 9.0 8.7 8.6 8.5 8.5  MG -- -- -- -- --  PHOS -- 7.7* -- -- --   Liver Function Tests:  Lab 03/05/12 0905 03/05/12 0016 03/04/12 0908 03/02/12 1402  AST 32 -- 29 45*  ALT 34 -- 34 57*  ALKPHOS 549* -- 419* 317*  BILITOT 0.6 -- 1.0 2.0*  PROT 6.9 -- 6.3 6.9  ALBUMIN 2.6* 2.4* 2.5* 3.0*   CBC:  Lab 03/05/12 0905 03/05/12 0021 03/04/12 1622 03/04/12 0440 03/03/12 0545 03/02/12 1403  WBC 5.0 5.9 6.8 6.3 7.3 --  NEUTROABS 3.5 -- -- -- -- 6.6  HGB 9.5* 9.1* 7.9* 8.5* 7.5* --  HCT 27.8* 26.4* 23.3* 24.8* 22.4* --  MCV 81.8 82.2 80.3 81.0 79.7 --  PLT 103* 90* 81* 89* 89* --    Recent Results (from the past 240 hour(s))  SURGICAL PCR SCREEN     Status: Abnormal   Collection Time   03/02/12  1:35 PM      Component Value Range Status Comment   MRSA, PCR NEGATIVE  NEGATIVE Final    Staphylococcus aureus POSITIVE (*) NEGATIVE Final   ANAEROBIC CULTURE     Status: Normal (Preliminary result)   Collection Time   03/02/12  4:20 PM      Component Value Range Status Comment   Specimen Description SYNOVIAL FLUID KNEE LEFT   Final    Special Requests RECEIVED ON SWAB   Final    Gram Stain     Final    Value: MODERATE WBC PRESENT, PREDOMINANTLY PMN     RARE GRAM POSITIVE COCCI     IN PAIRS Gram Stain Report Called to,Read Back By and Verified With: Gram Stain Report Called to,Read Back By and Verified With: DR GRAVES AT 1652 03/02/12 BY ROMEROJ Performed at [facility]   Culture     Final    Value: NO ANAEROBES ISOLATED; CULTURE IN PROGRESS FOR 5 DAYS   Report Status PENDING   Incomplete   BODY FLUID CULTURE     Status: Normal   Collection Time   03/02/12  4:20 PM      Component Value Range Status Comment   Specimen Description SYNOVIAL FLUID KNEE LEFT   Final    Special Requests RECEIVED ON SWAB   Final    Gram Stain     Final    Value: MODERATE WBC PRESENT, PREDOMINANTLY PMN     RARE GRAM POSITIVE COCCI     IN PAIRS Gram Stain Report Called to,Read Back By and  Verified With: Gram Stain Report Called to,Read Back By and Verified With: DR GRAVES AT 1652 03/02/12 BY ROMEROJ Performed at [facility]   Culture     Final    Value: MODERATE STAPHYLOCOCCUS AUREUS     Note: RIFAMPIN AND GENTAMICIN SHOULD NOT BE USED AS SINGLE DRUGS FOR TREATMENT OF STAPH INFECTIONS.   Report Status 03/04/2012 FINAL   Final    Organism ID, Bacteria STAPHYLOCOCCUS AUREUS   Final   GRAM STAIN     Status: Normal   Collection Time   03/02/12  4:20 PM      Component  Value Range Status Comment   Specimen Description SYNOVIAL FLUID KNEE LEFT   Final    Special Requests NONE   Final    Gram Stain     Final    Value: MODERATE WBC PRESENT, PREDOMINANTLY PMN     RARE GRAM POSITIVE COCCI IN PAIRS     CALLED TO DR. Luiz Blare 9.13.13 AT 1652 BY ROMEROJ   Report Status 03/02/2012 FINAL   Final   CULTURE, BLOOD (ROUTINE X 2)     Status: Normal (Preliminary result)   Collection Time   03/03/12 12:25 AM      Component Value Range Status Comment   Specimen Description BLOOD RIGHT HAND   Final    Special Requests BOTTLES DRAWN AEROBIC AND ANAEROBIC 5CC EACH   Final    Culture  Setup Time 03/03/2012 11:01   Final    Culture     Final    Value: STAPHYLOCOCCUS AUREUS     Note: RIFAMPIN AND GENTAMICIN SHOULD NOT BE USED AS SINGLE DRUGS FOR TREATMENT OF STAPH INFECTIONS.     Note: Gram Stain Report Called to,Read Back By and Verified With: ERIN ARNOLD 03/04/12 @ 11:30AM BY RUSCA.   Report Status PENDING   Incomplete   CULTURE, BLOOD (ROUTINE X 2)     Status: Normal (Preliminary result)   Collection Time   03/03/12 12:40 AM      Component Value Range Status Comment   Specimen Description BLOOD RIGHT HAND   Final    Special Requests BOTTLES DRAWN AEROBIC AND ANAEROBIC [facility] EACH   Final    Culture  Setup Time 03/03/2012 11:01   Final    Culture     Final    Value: STAPHYLOCOCCUS AUREUS     Note: Gram Stain Report Called to,Read Back By and Verified With: ERIN ARNOLD 03/04/12 @ 11:30AM BY  RUSCA.   Report Status PENDING   Incomplete   URINE CULTURE     Status: Normal   Collection Time   03/04/12  3:50 AM      Component Value Range Status Comment   Specimen Description URINE, CATHETERIZED   Final    Special Requests PATIENT ON FOLLOWING VANCOMYCIN   Final    Culture  Setup Time 03/04/2012 11:16   Final    Colony Count NO GROWTH   Final    Culture NO GROWTH   Final    Report Status 03/05/2012 FINAL   Final      Studies:  Recent x-ray studies have been reviewed in detail by the Attending Physician  Scheduled Meds:  Reviewed in detail by the Attending Physician   Lonia Blood, MD [facility] Office  [phone] Pager [phone]  On-Call/Text Page:      [email or web address]      password [email or web address]  If 7PM-7AM, please contact night-coverage [email or web address] Password [email or web address] 03/05/2012, 11:07 AM   LOS: 3 days

## 2012-03-05 NOTE — Consult Note (Signed)
Patient seen and examined.  Agree with PA's note. Procedure and risks of Port-a-cath removal were discussed with her.

## 2012-03-05 NOTE — Progress Notes (Addendum)
ANTIBIOTIC CONSULT NOTE - FOLLOW UP  Pharmacy Consult for vancomycin Indication: septic knee and bacteremia   No Known Allergies  Patient Measurements: Height: 5\' 6"  (167.6 cm) Weight: 195 lb (88.451 kg) IBW/kg (Calculated) : 59.3   Vital Signs: Temp: 98 F (36.7 C) (09/16 0801) Temp src: Oral (09/16 0801) BP: 113/66 mmHg (09/16 0802) Pulse Rate: 72  (09/16 0801) Intake/Output from previous day: 09/15 0701 - 09/16 0700 In: 1002.1 [I.V.:825; Blood:177.1] Out: 105 [Drains:105] Intake/Output from this shift:    Labs:  Basename 03/05/12 0021 03/05/12 0016 03/04/12 1622 03/04/12 1503 03/04/12 0908 03/04/12 0440  WBC 5.9 -- 6.8 -- -- 6.3  HGB 9.1* -- 7.9* -- -- 8.5*  PLT 90* -- 81* -- -- 89*  LABCREA -- -- -- -- -- --  CREATININE -- 4.10* -- 3.78* 3.42* --   Estimated Creatinine Clearance: 17.8 ml/min (by C-G formula based on Cr of 4.1).  Basename 03/05/12 0006 03/04/12 1503  VANCOTROUGH -- 32.7*  VANCOPEAK -- --  VANCORANDOM 29.5 --  GENTTROUGH -- --  GENTPEAK -- --  GENTRANDOM -- --  TOBRATROUGH -- --  TOBRAPEAK -- --  TOBRARND -- --  AMIKACINPEAK -- --  AMIKACINTROU -- --  AMIKACIN -- --     Assessment: 51 yo female with staph aureus bacteremia who was on vancomycin but with ATN and elevated vancomycin levels.  Patient to start cubicin (ID following).  Based on vancomycin levels thus far the half like is calculated as ~81 hrs (Ke= 0.0085).  Estimated time until  vancomycin level 15-20 is about 48hrs after last level drawn.  9/13 MRSA PCR + 9/14 Blood x 2>> GPC in clusters 9/13 Synovial fluid>> staph aureus (R to PCN and clindamycin, Sens to oxacillin)  9/13 Vanco>>  Goal of Therapy:  Vancomycin trough level 15-20 mcg/ml  Plan:  -Will plan to start daptomycin 500mg  IV q48hr (~6mg /kg) at 11pm on 03/06/12 -Will obtain a baseline CPK in am -Will follow further ID of cultures and renal function  Harland German, Pharm D 03/05/2012 8:48 AM

## 2012-03-05 NOTE — Care Management Note (Signed)
    Page 1 of 2   03/05/2012     2:25:18 PM   CARE MANAGEMENT NOTE 03/05/2012  Patient:  Julie, Ross   Account Number:  [id number]  Date Initiated:  03/05/2012  Documentation initiated by:  Junius Creamer  Subjective/Objective Assessment:   adm w infected prosthesis     Action/Plan:   lives w husband, pc dr Sharl Ma [place], hx of [facility]   Anticipated DC Date:     Anticipated DC Plan:  HOME W HOME HEALTH SERVICES      DC Planning Services  CM consult      [facility] Choice  [facility]ALTH   Choice offered to / List presented to:  C-1 Patient   DME arranged  IV PUMP/EQUIPMENT      DME agency  [facility]     [facility] arranged  HH-1 RN      [facility] agency  [facility]   Status of service:   Medicare Important Message given?   (If response is "NO", the following Medicare IM given date fields will be blank) Date Medicare IM given:   Date Additional Medicare IM given:    Discharge Disposition:    Per UR Regulation:  Reviewed for med. necessity/level of care/duration of stay  If discussed at Long Length of Stay Meetings, dates discussed:    Comments:  9/16 9:53a debbie Kyrin Gratz rn,bsn [phone] gave pt [facility] agency list. she has used [facility] in past and will use them again if home iv antibiotics needed.  ref to debbie t w [facility] to follow along for dc needs.

## 2012-03-05 NOTE — Progress Notes (Signed)
eLink Physician-Brief Progress Note Patient Name: Julie Ross DOB: Jul 15, 1960 MRN: [id number]  Date of Service  03/05/2012   HPI/Events of Note   GPC in synovial fluid and blood.  Vanc level 34 but not on abx currently.  eICU Interventions  No abx changes done.  Please address GPCs.      YACOUB,WESAM 03/05/2012, 12:18 AM

## 2012-03-05 NOTE — Progress Notes (Signed)
Physical Therapy Treatment Patient Details Name: Julie Ross MRN: [id number] DOB: 08/17/1960 Today's Date: 03/05/2012 Time: 4098-1191 PT Time Calculation (min): 36 min  PT Assessment / Plan / Recommendation Comments on Treatment Session  Patient s/p infected left knee prosthesis with I&D.  Patient able to perform stand pivot transfer with RW today.  Improving with mobility.      Follow Up Recommendations  Inpatient Rehab    Barriers to Discharge        Equipment Recommendations  Defer to next venue    Recommendations for Other Services Rehab consult  Frequency Min 5X/week   Plan Discharge plan remains appropriate;Frequency remains appropriate    Precautions / Restrictions Precautions Precautions: Fall;Knee Required Braces or Orthoses: Knee Immobilizer - Left Knee Immobilizer - Left: On at all times Restrictions Weight Bearing Restrictions: Yes LLE Weight Bearing: Weight bearing as tolerated Other Position/Activity Restrictions: No ROM   Pertinent Vitals/Pain VSS, Some pain    Mobility  Bed Mobility Bed Mobility: Supine to Sit;Sitting - Scoot to Edge of Bed Rolling Right: Not tested (comment) Rolling Left: Not tested (comment) Left Sidelying to Sit: Not tested (comment) Supine to Sit: 1: +2 Total assist Supine to Sit: Patient Percentage: 30% Sitting - Scoot to Edge of Bed: 2: Max assist (with pad) Sit to Supine: Not Tested (comment) Details for Bed Mobility Assistance: Max step by step cues for technique.  Very painful with movement of LLE to get to EOB.  Once foot on floor, pain was better.  Transfers Transfers: Sit to Stand;Stand to Sit;Stand Pivot Transfers Sit to Stand: 1: +2 Total assist;With upper extremity assist;From bed Sit to Stand: Patient Percentage: 50% Stand to Sit: 1: +2 Total assist;With upper extremity assist;With armrests;To chair/3-in-1 Stand to Sit: Patient Percentage: 60% Stand Pivot Transfers: 1: +2 Total assist Stand Pivot Transfers: Patient  Percentage: 70% Lateral/Scoot Transfers: Not tested (comment) Details for Transfer Assistance: Able to perform stand pivot transfer with RW with cues for hand placement for sit to stand and cues for sequencing steps and RW during transfer.   Ambulation/Gait Ambulation/Gait Assistance: Not tested (comment) Stairs: No Wheelchair Mobility Wheelchair Mobility: No      PT Goals Acute Rehab PT Goals PT Goal: Supine/Side to Sit - Progress: Progressing toward goal PT Goal: Sit to Stand - Progress: Progressing toward goal PT Goal: Stand to Sit - Progress: Progressing toward goal PT Goal: Stand - Progress: Progressing toward goal  Visit Information  Last PT Received On: 03/05/12 Assistance Needed: +2    Subjective Data  Subjective: "I don't know if I can."   Cognition  Overall Cognitive Status: Appears within functional limits for tasks assessed/performed Arousal/Alertness: Awake/alert Orientation Level: Appears intact for tasks assessed Behavior During Session: [facility] for tasks performed    Balance  Static Sitting Balance Static Sitting - Balance Support: Bilateral upper extremity supported;Feet supported Static Sitting - Level of Assistance: 5: Stand by assistance Static Sitting - Comment/# of Minutes: up to 5 minutes; felt nauseated but VSS and with time, improved  End of Session PT - End of Session Equipment Utilized During Treatment: Gait belt;Left knee immobilizer;Oxygen Activity Tolerance: Patient limited by fatigue;Patient limited by pain Patient left: in chair;with call bell/phone within reach;with family/visitor present Nurse Communication: Mobility status       INGOLD,Ettie Krontz 03/05/2012, 4:53 PM  [facility] Rehabilitation [phone] (pager)

## 2012-03-05 NOTE — Progress Notes (Signed)
Subjective:  BP has been low overnight, no UOP recorded.  Seems a little confused  Objective Vital signs in last 24 hours: Filed Vitals:   03/05/12 0358 03/05/12 0400 03/05/12 0801 03/05/12 0802  BP: 85/60   113/66  Pulse:   72   Temp: 98.1 F (36.7 C)  98 F (36.7 C)   TempSrc: Oral  Oral   Resp:  15    Height:      Weight:      SpO2: 98% 97%  96%   Weight change:   Intake/Output Summary (Last 24 hours) at 03/05/12 0855 Last data filed at 03/05/12 0600  Gross per 24 hour  Intake 1002.08 ml  Output    105 ml  Net 897.08 ml   Labs: Basic Metabolic Panel:  Lab 03/05/12 1610 03/04/12 1503 03/04/12 0908  NA 128* 127* 125*  K 3.8 3.9 3.8  CL 92* 90* 89*  CO2 20 21 22   GLUCOSE 138* 149* 163*  BUN 30* 25* 21  CREATININE 4.10* 3.78* 3.42*  CALCIUM 8.7 8.6 8.5  ALB -- -- --  PHOS 7.7* -- --   Liver Function Tests:  Lab 03/05/12 0016 03/04/12 0908 03/02/12 1402  AST -- 29 45*  ALT -- 34 57*  ALKPHOS -- 419* 317*  BILITOT -- 1.0 2.0*  PROT -- 6.3 6.9  ALBUMIN 2.4* 2.5* 3.0*   No results found for this basename: LIPASE:3,AMYLASE:3 in the last 168 hours No results found for this basename: AMMONIA:3 in the last 168 hours CBC:  Lab 03/05/12 0021 03/04/12 1622 03/04/12 0440 03/03/12 0545 03/02/12 1403  WBC 5.9 6.8 6.3 -- --  NEUTROABS -- -- -- -- 6.6  HGB 9.1* 7.9* 8.5* -- --  HCT 26.4* 23.3* 24.8* -- --  MCV 82.2 80.3 81.0 79.7 80.2  PLT 90* 81* 89* -- --   Cardiac Enzymes: No results found for this basename: CKTOTAL:5,CKMB:5,CKMBINDEX:5,TROPONINI:5 in the last 168 hours CBG: No results found for this basename: GLUCAP:5 in the last 168 hours  Iron Studies: No results found for this basename: IRON,TIBC,TRANSFERRIN,FERRITIN in the last 72 hours Studies/Results: Ct Head Wo Contrast  03/05/2012  *RADIOLOGY REPORT*  Clinical Data:  Altered mental status.  Breast cancer.  CT HEAD WITHOUT CONTRAST  Technique:  Contiguous axial images were obtained from the base of the  skull through the vertex without contrast.  Comparison: None.  Findings: There is no evidence for acute infarction, intracranial hemorrhage, mass lesion, hydrocephalus, or extra-axial fluid. Borderline mild atrophy.  No definite white matter disease.  No osseous destructive lesion.  Sinuses and mastoids clear.  IMPRESSION: Borderline mild atrophy.  No acute intracranial findings.   Original Report Authenticated By: Elsie Stain, M.D.    Dg Chest Port 1 View  03/04/2012  *RADIOLOGY REPORT*  Clinical Data: Bacteremia.  Infected knee prosthesis.  Acute renal insufficiency.  PORTABLE CHEST - 1 VIEW 03/04/2012 1309 hours:  Comparison: Portable chest x-ray 01/16/2012 and two-view chest x- ray 08/11/2011.  Findings: Suboptimal inspiration due to body habitus which accounts for crowded bronchovascular markings at the bases and accentuates the cardiac silhouette.  Taking this into account, cardiac silhouette enlarged but stable.  Lungs clear.  Pulmonary venous hypertension without overt edema.  Right subclavian Port-A-Cath tip projects over the lower SVC.  IMPRESSION: Suboptimal inspiration.  Stable cardiomegaly.  No acute cardiopulmonary disease.   Original Report Authenticated By: Arnell Sieving, M.D.    Medications: Infusions:    . sodium chloride 100 mL (03/04/12 2250)  .  DISCONTD: dextrose 5 % and 0.45 % NaCl with KCl 20 mEq/L 1,000 mL (03/03/12 2347)    Scheduled Medications:    . DAPTOmycin (CUBICIN)  IV  500 mg Intravenous Q24H  . docusate sodium  100 mg Oral BID  . DULoxetine  60 mg Oral Daily  . multivitamin with minerals  1 tablet Oral Daily  . pantoprazole  80 mg Oral Q1200  . povidone-iodine   Topical Once  . sodium chloride  500 mL Intravenous Once  . Warfarin - Pharmacist Dosing Inpatient   Does not apply q1800  . DISCONTD: baclofen  10 mg Oral TID  . DISCONTD: baclofen  5 mg Oral TID  . DISCONTD: gabapentin  600 mg Oral TID  . DISCONTD: hydrochlorothiazide  12.5 mg Oral Daily    . DISCONTD: LORazepam  0.5 mg Oral Q8H  . DISCONTD: losartan  50 mg Oral Daily  . DISCONTD: vancomycin  1,000 mg Intravenous Q12H  . DISCONTD: warfarin  10 mg Oral ONCE-1800  . DISCONTD: warfarin   Does not apply Once    have reviewed scheduled and prn medications.  Physical Exam: General: alert but seems confused to me  Heart: RRR Lungs: mostly clear Abdomen: positive bowel sounds  Extremities: Left leg wrapped with drain. Some edema  I Assessment/ Plan: Pt is a 51 y.o. yo female who was admitted on 03/02/2012 with  ARF and infected prosthetic knee  Assessment/Plan: 1. ARF- Baseline normal renal function. U/A showed granular casts - was on ARB and had supratherapuetic vanc level as well as low BPs.  Currently oliguric.  Continue supportive care- all meds that can cause low BP have been D/Cd.  No indications for HD yet, hopefully will turn around with med changes 2. HTN/vol- low BPs volume seems OK. Holding all BP and BP lowering meds 3. Anemia- hgb 9.1 will follow- no ESA 4. ID- staph aureus bacteremia- now on cubicin    Emalyn Schou A   03/05/2012,8:55 AM  LOS: 3 days

## 2012-03-05 NOTE — Progress Notes (Signed)
Head CT done. Pt returned to unit and settled comfortable in bed.

## 2012-03-05 NOTE — Progress Notes (Signed)
*  PRELIMINARY RESULTS* Echocardiogram 2D Echocardiogram has been performed.  Jeryl Columbia 03/05/2012, 10:07 AM

## 2012-03-05 NOTE — Progress Notes (Signed)
Subjective: 3 Days Post-Op Procedure(s) (LRB): IRRIGATION AND DEBRIDEMENT KNEE WITH POLY EXCHANGE (Left) Patient reports pain as 4 on 0-10 scale.  Pt completely alert today  Objective: Vital signs in last 24 hours: Temp:  [97.4 F (36.3 C)-98.4 F (36.9 C)] 98.1 F (36.7 C) (09/16 0358) Pulse Rate:  [63-81] 64  (09/16 0000) Resp:  [4-18] 15  (09/16 0400) BP: (62-147)/(40-77) 85/60 mmHg (09/16 0358) SpO2:  [97 %-100 %] 97 % (09/16 0400) FiO2 (%):  [2 %] 2 % (09/15 1300)  Intake/Output from previous day: 09/15 0701 - 09/16 0700 In: 1002.1 [I.V.:825; Blood:177.1] Out: 105 [Drains:105] Intake/Output this shift:     Basename 03/05/12 0021 03/04/12 1622 03/04/12 0440 03/03/12 0545 03/02/12 1403  HGB 9.1* 7.9* 8.5* 7.5* 9.4*    Basename 03/05/12 0021 03/04/12 1622  WBC 5.9 6.8  RBC 3.21* 2.90*  HCT 26.4* 23.3*  PLT 90* 81*    Basename 03/05/12 0016 03/04/12 1503  NA 128* 127*  K 3.8 3.9  CL 92* 90*  CO2 20 21  BUN 30* 25*  CREATININE 4.10* 3.78*  GLUCOSE 138* 149*  CALCIUM 8.7 8.6    Basename 03/05/12 0021 03/04/12 0440  LABPT -- --  INR 1.09 1.03    Neurologically intact ABD soft Neurovascular intact Sensation intact distally Intact pulses distally Dorsiflexion/Plantar flexion intact No cellulitis present Compartment soft NO EFFUSION knee moving well and no warmth or erythema Assessment/Plan: 3 Days Post-Op Procedure(s) (LRB): IRRIGATION AND DEBRIDEMENT KNEE WITH POLY EXCHANGE (Left) Continued concern over infection-- i do not think knee is source as there is dramatic improvement and no fluid APPRECIATE HELP OF ALL CONSULTANTS MAY BEGIN SMALL MOVEMENT L. KNEE Other per consultants working on renal issues   Julie Ross L 03/05/2012, 8:01 AM

## 2012-03-05 NOTE — Consult Note (Signed)
Reason for Consult:  Staphylococcus Aureus bacteremia with Port.  ID request port be removed. Referring Physician: Sharon Seller Nephrology: Dr. Arlean Hopping Infectious disease: Dr. Ninetta Lights General surgery: Dr. Tonette Bihari Julie Ross is an 51 y.o. female.  HPI: The patient is a 51 year old female who underwent a left knee replacement in February 2013. She presented back to orthopedics on 03/02/2012 with left knee swelling.   White cells and Gram stain was positive for gram-positive cocci. She was admitted underwent incision and drainage pollyexchange  on 03/02/29. Synovial fluid Culture and blood cultures are subsequently been positive for staph aureus. Patient was started on vancomycin with a baseline creatinine is 0.93 and subsequently had a progressive decline in her renal function. Currently her creatinine is 4.25 and she is being followed by a renal with acute kidney injury, and [facility]. She was found to have left breast cancer. She mastectomy with sentinel lymph node biopsy by Dr. Cyndia Bent on 12/19/2011, and had insertion of a Port-A-Cath on the right 01/16/2012 by Dr. Jamey Ripa also. With her bacteremia and septic joint she was seen by Dr. Ninetta Lights infectious disease service and he recommended removal of Port-A-Cath. 2 blood cultures Right hand reported positive. She was on chemotherapy via the Port-A-Cath in August. Her last chemotherapy this was 02/17/12. We are Asked to see the patient patient in consultation for removal Port-A-Cath.         Past Medical History  Diagnosis Date  . Hypertension   . Hypothyroidism   . Arthritis   . Neuromuscular disorder     MS TX WITH CYMBALTA   . Multiple sclerosis   . H/O colonoscopy   . H/O bone density study 03/2011  . Wears glasses   . Heart burn   . Depression   . Sleep apnea     MILD SLEEP APNEA, NO MACHINE  6 YRS AGO [facility]   . Breast cancer     lt breast    Past Surgical History  Procedure Date  . Tonsillectomy   . Tumor removal    FROM PELVIS AGE 66  . Cesarean section     X2   . No past surgeries     BLADDER SLING  4-5 YRS AGO   . Knee arthroscopy     LT KNEE 04/2011  . Total knee arthroplasty 08/15/2011    Procedure: TOTAL KNEE ARTHROPLASTY; lft Surgeon: Harvie Junior, MD;  Location: [facility] OR;  Service: Orthopedics;  Laterality: Left;  RIGHT KNEE CORTIZONE INJECTION  . Mohs surgery   . Knee arthroscopy 82    rt  . Joint replacement Feb 2013  . Mastectomy w/ sentinel node biopsy 12/19/2011    Procedure: MASTECTOMY WITH SENTINEL LYMPH NODE BIOPSY;  Surgeon: Currie Paris, MD;  Location: [facility] OR;  Service: General;  Laterality: Left;  left breast and left axilla   . Portacath placement 01/16/2012    Procedure: INSERTION PORT-A-CATH;  Surgeon: Currie Paris, MD;  Location: [facility];  Service: General;  Laterality: Right;  Porta Cath Placement     Family History  Problem Relation Age of Onset  . Breast cancer Paternal Aunt   . Breast cancer Maternal Grandmother   . Heart disease Maternal Grandfather     Social History:  reports that she has never smoked. She has never used smokeless tobacco. She reports that she does not drink alcohol or use illicit drugs.  Allergies: No Known Allergies  Medications:  Prior to Admission:  Prescriptions prior to  admission  Medication Sig Dispense Refill  . baclofen (LIORESAL) 10 MG tablet Take 10 mg by mouth 3 (three) times daily.      Marland Kitchen CALCIUM-MAGNESIUM PO Take 1 tablet by mouth daily.      . clonazePAM (KLONOPIN) 0.5 MG tablet Take 0.5 mg by mouth at bedtime as needed. For sleep      . dexamethasone (DECADRON) 4 MG tablet Take 2 tablets two times a day the day before Taxotere. Then take 2 tabs two times a day starting the day after chemo for 3 days.  30 tablet  1  . DULoxetine (CYMBALTA) 60 MG capsule Take 60 mg by mouth daily.      Marland Kitchen esomeprazole (NEXIUM) 40 MG capsule Take 40 mg by mouth daily before breakfast.      . gabapentin (NEURONTIN) 600 MG  tablet Take 600 mg by mouth 3 (three) times daily.      Marland Kitchen HYDROcodone-acetaminophen (NORCO) 10-325 MG per tablet Take 1 tablet by mouth every 8 (eight) hours as needed. As needed for pain.      Marland Kitchen lidocaine-prilocaine (EMLA) cream Apply 1 application topically as needed.      Marland Kitchen LORazepam (ATIVAN) 0.5 MG tablet Take 0.5 mg by mouth every 8 (eight) hours. As needed for anxiety.      Marland Kitchen losartan-hydrochlorothiazide (HYZAAR) 50-12.5 MG per tablet Take 1 tablet by mouth daily.      . Multiple Vitamins-Minerals (MULTIVITAMINS THER. W/MINERALS) TABS Take 1 tablet by mouth daily.      . ondansetron (ZOFRAN) 8 MG tablet Take 1 tablet two times a day starting the day after chemo for 3 days. Then take 1 tab two times a day as needed for nausea or vomiting.  30 tablet  1  . prochlorperazine (COMPAZINE) 10 MG tablet Take 1 tablet (10 mg total) by mouth every 6 (six) hours as needed (Nausea or vomiting).  30 tablet  1  . prochlorperazine (COMPAZINE) 25 MG suppository Place 1 suppository (25 mg total) rectally every 12 (twelve) hours as needed for nausea.  12 suppository  3  . naproxen sodium (ANAPROX) 550 MG tablet Take 550 mg by mouth as needed.      Marland Kitchen PRESCRIPTION MEDICATION Chemotherapy regimen. Taxotere.      Marland Kitchen UNABLE TO FIND Cranial Prothesis  1 each  0   Scheduled:   . DAPTOmycin (CUBICIN)  IV  500 mg Intravenous Q24H  . docusate sodium  100 mg Oral BID  . DULoxetine  60 mg Oral Daily  . multivitamin with minerals  1 tablet Oral Daily  . pantoprazole  80 mg Oral Q1200  . povidone-iodine   Topical Once  . rifampin  300 mg Oral Daily  . sodium chloride  500 mL Intravenous Once  . Warfarin - Pharmacist Dosing Inpatient   Does not apply q1800  . DISCONTD: baclofen  10 mg Oral TID  . DISCONTD: baclofen  5 mg Oral TID  . DISCONTD: DAPTOmycin (CUBICIN)  IV  500 mg Intravenous Q24H  . DISCONTD: gabapentin  600 mg Oral TID  . DISCONTD: LORazepam  0.5 mg Oral Q8H  . DISCONTD: warfarin  10 mg Oral ONCE-1800   . DISCONTD: warfarin   Does not apply Once   Continuous:   . sodium chloride 100 mL (03/04/12 2250)   QQV:[id number], acetaminophen, alum & mag hydroxide-simeth, clonazePAM, ondansetron (ZOFRAN) IV, ondansetron, senna-docusate, traMADol Anti-infectives     Start     Dose/Rate Route Frequency Ordered Stop   03/06/12 2300  DAPTOmycin (CUBICIN) 500 mg in sodium chloride 0.9 % IVPB        500 mg 220 mL/hr over 30 Minutes Intravenous Every 24 hours 03/05/12 0856     03/06/12 1200   DAPTOmycin (CUBICIN) 500 mg in sodium chloride 0.9 % IVPB  Status:  Discontinued        500 mg 220 mL/hr over 30 Minutes Intravenous Every 24 hours 03/05/12 0850 03/05/12 0856   03/05/12 1000   rifampin (RIFADIN) capsule 300 mg        300 mg Oral Daily 03/05/12 0915     03/03/12 0400   vancomycin (VANCOCIN) IVPB 1000 mg/200 mL premix  Status:  Discontinued        1,000 mg 200 mL/hr over 60 Minutes Intravenous Every 12 hours 03/02/12 1946 03/04/12 1134   03/02/12 1656   polymyxin B 500,000 Units, bacitracin 50,000 Units in sodium chloride irrigation 0.9 % 500 mL irrigation  Status:  Discontinued          As needed 03/02/12 1656 03/02/12 1741          Results for orders placed during the hospital encounter of 03/02/12 (from the past 48 hour(s))  BLOOD GAS, ARTERIAL     Status: Abnormal   Collection Time   03/03/12  9:00 PM      Component Value Range Comment   O2 Content 2.0      Delivery systems NASAL CANNULA      pH, Arterial 7.395  7.350 - 7.450    pCO2 arterial 37.5  35.0 - 45.0 mmHg    pO2, Arterial 70.7 (*) 80.0 - 100.0 mmHg    Bicarbonate 22.5  20.0 - 24.0 mEq/L    TCO2 23.6  0 - 100 mmol/L    Acid-base deficit 1.7  0.0 - 2.0 mmol/L    O2 Saturation 93.8      Patient temperature 98.6      Collection site RIGHT RADIAL      Drawn by [id number]      Sample type ARTERIAL DRAW      Allens test (pass/fail) PASS  PASS   URINALYSIS, ROUTINE W REFLEX MICROSCOPIC     Status: Abnormal   Collection  Time   03/04/12  3:50 AM      Component Value Range Comment   Color, Urine AMBER (*) YELLOW BIOCHEMICALS MAY BE AFFECTED BY COLOR   APPearance TURBID (*) CLEAR    Specific Gravity, Urine 1.016  1.005 - 1.030    pH 5.0  5.0 - 8.0    Glucose, UA NEGATIVE  NEGATIVE mg/dL    Hgb urine dipstick NEGATIVE  NEGATIVE    Bilirubin Urine NEGATIVE  NEGATIVE    Ketones, ur NEGATIVE  NEGATIVE mg/dL    Protein, ur NEGATIVE  NEGATIVE mg/dL    Urobilinogen, UA 1.0  0.0 - 1.0 mg/dL    Nitrite NEGATIVE  NEGATIVE    Leukocytes, UA NEGATIVE  NEGATIVE   URINE CULTURE     Status: Normal   Collection Time   03/04/12  3:50 AM      Component Value Range Comment   Specimen Description URINE, CATHETERIZED      Special Requests PATIENT ON FOLLOWING VANCOMYCIN      Culture  Setup Time 03/04/2012 11:16      Colony Count NO GROWTH      Culture NO GROWTH      Report Status 03/05/2012 FINAL     URINE MICROSCOPIC-ADD ON     Status:  Abnormal   Collection Time   03/04/12  3:50 AM      Component Value Range Comment   Squamous Epithelial / LPF FEW (*) RARE    WBC, UA 0-2  <3 WBC/hpf    RBC / HPF 0-2  <3 RBC/hpf    Bacteria, UA FEW (*) RARE    Casts GRANULAR CAST (*) NEGATIVE HYALINE CASTS   Urine-Other AMORPHOUS URATES/PHOSPHATES     PROTIME-INR     Status: Normal   Collection Time   03/04/12  4:40 AM      Component Value Range Comment   Prothrombin Time 13.7  11.6 - 15.2 seconds    INR 1.03  0.00 - 1.49   CBC     Status: Abnormal   Collection Time   03/04/12  4:40 AM      Component Value Range Comment   WBC 6.3  4.0 - 10.5 K/uL    RBC 3.06 (*) 3.87 - 5.11 MIL/uL    Hemoglobin 8.5 (*) 12.0 - 15.0 g/dL    HCT 16.1 (*) 09.6 - 46.0 %    MCV 81.0  78.0 - 100.0 fL    MCH 27.8  26.0 - 34.0 pg    MCHC 34.3  30.0 - 36.0 g/dL    RDW 04.5 (*) 40.9 - 15.5 %    Platelets 89 (*) 150 - 400 K/uL CONSISTENT WITH PREVIOUS RESULT  COMPREHENSIVE METABOLIC PANEL     Status: Abnormal   Collection Time   03/04/12  9:08 AM       Component Value Range Comment   Sodium 125 (*) 135 - 145 mEq/L    Potassium 3.8  3.5 - 5.1 mEq/L    Chloride 89 (*) 96 - 112 mEq/L    CO2 22  19 - 32 mEq/L    Glucose, Bld 163 (*) 70 - 99 mg/dL    BUN 21  6 - 23 mg/dL    Creatinine, Ser 8.11 (*) 0.50 - 1.10 mg/dL DELTA CHECK NOTED   Calcium 8.5  8.4 - 10.5 mg/dL    Total Protein 6.3  6.0 - 8.3 g/dL    Albumin 2.5 (*) 3.5 - 5.2 g/dL    AST 29  0 - 37 U/L    ALT 34  0 - 35 U/L    Alkaline Phosphatase 419 (*) 39 - 117 U/L    Total Bilirubin 1.0  0.3 - 1.2 mg/dL    GFR calc non Af Amer 14 (*) >90 mL/min    GFR calc Af Amer 17 (*) >90 mL/min   LACTIC ACID, PLASMA     Status: Normal   Collection Time   03/04/12  1:13 PM      Component Value Range Comment   Lactic Acid, Venous 0.7  0.5 - 2.2 mmol/L   VANCOMYCIN, TROUGH     Status: Abnormal   Collection Time   03/04/12  3:03 PM      Component Value Range Comment   Vancomycin Tr 32.7 (*) 10.0 - 20.0 ug/mL   BASIC METABOLIC PANEL     Status: Abnormal   Collection Time   03/04/12  3:03 PM      Component Value Range Comment   Sodium 127 (*) 135 - 145 mEq/L    Potassium 3.9  3.5 - 5.1 mEq/L    Chloride 90 (*) 96 - 112 mEq/L    CO2 21  19 - 32 mEq/L    Glucose, Bld 149 (*) 70 - 99 mg/dL  BUN 25 (*) 6 - 23 mg/dL    Creatinine, Ser 1.61 (*) 0.50 - 1.10 mg/dL    Calcium 8.6  8.4 - 09.6 mg/dL    GFR calc non Af Amer 13 (*) >90 mL/min    GFR calc Af Amer 15 (*) >90 mL/min   CBC     Status: Abnormal   Collection Time   03/04/12  4:22 PM      Component Value Range Comment   WBC 6.8  4.0 - 10.5 K/uL    RBC 2.90 (*) 3.87 - 5.11 MIL/uL    Hemoglobin 7.9 (*) 12.0 - 15.0 g/dL    HCT 04.5 (*) 40.9 - 46.0 %    MCV 80.3  78.0 - 100.0 fL    MCH 27.2  26.0 - 34.0 pg    MCHC 33.9  30.0 - 36.0 g/dL    RDW 81.1 (*) 91.4 - 15.5 %    Platelets 81 (*) 150 - 400 K/uL CONSISTENT WITH PREVIOUS RESULT  PREPARE RBC (CROSSMATCH)     Status: Normal   Collection Time   03/04/12  5:40 PM      Component  Value Range Comment   Order Confirmation ORDER PROCESSED BY BLOOD BANK     BLOOD GAS, ARTERIAL     Status: Abnormal   Collection Time   03/04/12  5:59 PM      Component Value Range Comment   O2 Content 2.0      pH, Arterial 7.362  7.350 - 7.450    pCO2 arterial 36.0  35.0 - 45.0 mmHg    pO2, Arterial 154.0 (*) 80.0 - 100.0 mmHg    Bicarbonate 19.9 (*) 20.0 - 24.0 mEq/L    TCO2 21.0  0 - 100 mmol/L    Acid-base deficit 4.5 (*) 0.0 - 2.0 mmol/L    O2 Saturation 99.1      Patient temperature 98.6      Collection site RIGHT RADIAL      Drawn by COLLECTED BY RT      Sample type ARTERIAL DRAW      Allens test (pass/fail) PASS  PASS   OSMOLALITY, URINE     Status: Abnormal   Collection Time   03/04/12 10:54 PM      Component Value Range Comment   Osmolality, Ur 267 (*) 390 - 1090 mOsm/kg   SODIUM, URINE, RANDOM     Status: Normal   Collection Time   03/04/12 10:54 PM      Component Value Range Comment   Sodium, Ur <10     VANCOMYCIN, RANDOM     Status: Normal   Collection Time   03/05/12 12:06 AM      Component Value Range Comment   Vancomycin Rm 29.5     RENAL FUNCTION PANEL     Status: Abnormal   Collection Time   03/05/12 12:16 AM      Component Value Range Comment   Sodium 128 (*) 135 - 145 mEq/L    Potassium 3.8  3.5 - 5.1 mEq/L    Chloride 92 (*) 96 - 112 mEq/L    CO2 20  19 - 32 mEq/L    Glucose, Bld 138 (*) 70 - 99 mg/dL    BUN 30 (*) 6 - 23 mg/dL    Creatinine, Ser 7.82 (*) 0.50 - 1.10 mg/dL    Calcium 8.7  8.4 - 95.6 mg/dL    Phosphorus 7.7 (*) 2.3 - 4.6 mg/dL    Albumin 2.4 (*)  3.5 - 5.2 g/dL    GFR calc non Af Amer 11 (*) >90 mL/min    GFR calc Af Amer 13 (*) >90 mL/min   PROTIME-INR     Status: Normal   Collection Time   03/05/12 12:21 AM      Component Value Range Comment   Prothrombin Time 14.3  11.6 - 15.2 seconds    INR 1.09  0.00 - 1.49   CBC     Status: Abnormal   Collection Time   03/05/12 12:21 AM      Component Value Range Comment   WBC 5.9  4.0 -  10.5 K/uL    RBC 3.21 (*) 3.87 - 5.11 MIL/uL    Hemoglobin 9.1 (*) 12.0 - 15.0 g/dL    HCT 96.0 (*) 45.4 - 46.0 %    MCV 82.2  78.0 - 100.0 fL    MCH 28.3  26.0 - 34.0 pg    MCHC 34.5  30.0 - 36.0 g/dL    RDW 09.8 (*) 11.9 - 15.5 %    Platelets 90 (*) 150 - 400 K/uL CONSISTENT WITH PREVIOUS RESULT  APTT     Status: Normal   Collection Time   03/05/12  9:05 AM      Component Value Range Comment   aPTT 33  24 - 37 seconds   CBC WITH DIFFERENTIAL     Status: Abnormal   Collection Time   03/05/12  9:05 AM      Component Value Range Comment   WBC 5.0  4.0 - 10.5 K/uL    RBC 3.40 (*) 3.87 - 5.11 MIL/uL    Hemoglobin 9.5 (*) 12.0 - 15.0 g/dL    HCT 14.7 (*) 82.9 - 46.0 %    MCV 81.8  78.0 - 100.0 fL    MCH 27.9  26.0 - 34.0 pg    MCHC 34.2  30.0 - 36.0 g/dL    RDW 56.2 (*) 13.0 - 15.5 %    Platelets 103 (*) 150 - 400 K/uL CONSISTENT WITH PREVIOUS RESULT   Neutrophils Relative 71  43 - 77 %    Neutro Abs 3.5  1.7 - 7.7 K/uL    Lymphocytes Relative 22  12 - 46 %    Lymphs Abs 1.1  0.7 - 4.0 K/uL    Monocytes Relative 7  3 - 12 %    Monocytes Absolute 0.3  0.1 - 1.0 K/uL    Eosinophils Relative 0  0 - 5 %    Eosinophils Absolute 0.0  0.0 - 0.7 K/uL    Basophils Relative 0  0 - 1 %    Basophils Absolute 0.0  0.0 - 0.1 K/uL   COMPREHENSIVE METABOLIC PANEL     Status: Abnormal   Collection Time   03/05/12  9:05 AM      Component Value Range Comment   Sodium 129 (*) 135 - 145 mEq/L    Potassium 3.7  3.5 - 5.1 mEq/L    Chloride 91 (*) 96 - 112 mEq/L    CO2 21  19 - 32 mEq/L    Glucose, Bld 110 (*) 70 - 99 mg/dL    BUN 35 (*) 6 - 23 mg/dL    Creatinine, Ser 8.65 (*) 0.50 - 1.10 mg/dL    Calcium 9.0  8.4 - 78.4 mg/dL    Total Protein 6.9  6.0 - 8.3 g/dL    Albumin 2.6 (*) 3.5 - 5.2 g/dL    AST 32  0 - 37 U/L    ALT 34  0 - 35 U/L    Alkaline Phosphatase 549 (*) 39 - 117 U/L    Total Bilirubin 0.6  0.3 - 1.2 mg/dL    GFR calc non Af Amer 11 (*) >90 mL/min    GFR calc Af Amer 13 (*)  >90 mL/min   PROTIME-INR     Status: Normal   Collection Time   03/05/12  9:05 AM      Component Value Range Comment   Prothrombin Time 14.6  11.6 - 15.2 seconds    INR 1.12  0.00 - 1.49   TYPE AND SCREEN     Status: Normal   Collection Time   03/05/12  9:05 AM      Component Value Range Comment   ABO/RH(D) O POS      Antibody Screen NEG      Sample Expiration 03/08/2012       Ct Head Wo Contrast  03/05/2012  *RADIOLOGY REPORT*  Clinical Data:  Altered mental status.  Breast cancer.  CT HEAD WITHOUT CONTRAST  Technique:  Contiguous axial images were obtained from the base of the skull through the vertex without contrast.  Comparison: None.  Findings: There is no evidence for acute infarction, intracranial hemorrhage, mass lesion, hydrocephalus, or extra-axial fluid. Borderline mild atrophy.  No definite white matter disease.  No osseous destructive lesion.  Sinuses and mastoids clear.  IMPRESSION: Borderline mild atrophy.  No acute intracranial findings.   Original Report Authenticated By: Elsie Stain, M.D.    Dg Chest Port 1 View  03/04/2012  *RADIOLOGY REPORT*  Clinical Data: Bacteremia.  Infected knee prosthesis.  Acute renal insufficiency.  PORTABLE CHEST - 1 VIEW 03/04/2012 1309 hours:  Comparison: Portable chest x-ray 01/16/2012 and two-view chest x- ray 08/11/2011.  Findings: Suboptimal inspiration due to body habitus which accounts for crowded bronchovascular markings at the bases and accentuates the cardiac silhouette.  Taking this into account, cardiac silhouette enlarged but stable.  Lungs clear.  Pulmonary venous hypertension without overt edema.  Right subclavian Port-A-Cath tip projects over the lower SVC.  IMPRESSION: Suboptimal inspiration.  Stable cardiomegaly.  No acute cardiopulmonary disease.   Original Report Authenticated By: Arnell Sieving, M.D.     Review of Systems  Constitutional: Positive for fever, chills and diaphoresis. Negative for weight loss and  malaise/fatigue.  HENT: Negative.   Eyes: Negative.   Respiratory: Negative.   Cardiovascular: Negative.   Gastrointestinal: Positive for heartburn, nausea (only since admission trying to get up to do PT) and blood in stool (she thinks she may have had some with [facility].). Negative for vomiting, abdominal pain, diarrhea and constipation.  Genitourinary:       Decreased urineout put  Musculoskeletal:       Chronic muscle fatigue, some vision issues, some occasional memory  issues.  Skin: Negative.   Neurological: Negative.   Endo/Heme/Allergies: Negative.   Psychiatric/Behavioral: Positive for depression.   Blood pressure 134/59, pulse 80, temperature 97.3 F (36.3 C), temperature source Oral, resp. rate 10, height 5\' 6"  (1.676 m), weight 88.451 kg (195 lb), last menstrual period 12/05/2011, SpO2 90.00%. Physical Exam  Constitutional: She is oriented to person, place, and time. She appears well-developed and well-nourished. No distress.  HENT:  Head: Normocephalic and atraumatic.  Nose: Nose normal.  Eyes: Conjunctivae normal and EOM are normal. Pupils are equal, round, and reactive to light. Right eye exhibits no discharge. Left eye exhibits no discharge. No scleral  icterus.  Neck: Normal range of motion. Neck supple. No JVD present. No tracheal deviation present. No thyromegaly present.  Cardiovascular: Normal rate, regular rhythm, normal heart sounds and intact distal pulses.  Exam reveals no gallop.   No murmur heard. Respiratory: Effort normal and breath sounds normal. No stridor. No respiratory distress. She has no wheezes. She has no rales. She exhibits no tenderness.       Port right chest  GI: Soft. Bowel sounds are normal. She exhibits no distension and no mass. There is no tenderness. There is no rebound and no guarding.  Musculoskeletal: She exhibits no edema.       She has a brace and Ace dressing left knee with drain coming from knee. She also needs a Right knee  replacement.  Lymphadenopathy:    She has no cervical adenopathy.  Neurological: She is alert and oriented to person, place, and time. She has normal reflexes. No cranial nerve deficit. Coordination normal.  Skin: Skin is warm and dry. No rash noted. She is not diaphoretic. No erythema. No pallor.  Psychiatric: She has a normal mood and affect. Her behavior is normal. Judgment and thought content normal.    Assessment/Plan: 1. Septic left knee status post total knee replacement 07/2011 2. Staph aureus bacteremia with positive blood cultures on the right hand. Staph aureus from the synovial fluid the left knee. 3. Port-A-Cath for chemotherapy, status post left mastectomy for breast cancer, 12/19/11 Dr. Jamey Ripa. 4. Acute renal failure probably related to vancomycin therapy. 5. Dehydration 6. Hyponatremia and hypokalemia. 7. Hypertension 8. History of multiple sclerosis 9. Reported history of hypothyroidism currently on no therapy. 10. Arthritis status post left knee replacement. She also needs right knee replaced. 11. Mild sleep apnea but has not required CPAP. 12. Depression 13. Scheduled for coumadin therapy. INR 1.12  Plan: Dr. Abbey Chatters will review, and scheduled for Port-A-Cath removal tomorrow. Her Coumadin will need to be withheld until after the Port-A-Cath is removed. We will make her n.p.o. after midnight tonight. They do have plans to place a PICC line for IV access/antibiotic therapy.  Dr. Aleen Campi has placed a hold on coumadin consult till after portacath is removed. Will [facility] physician assistant for Dr. Avel Peace.   Anicka Stuckert 03/05/2012, 12:37 PM

## 2012-03-06 ENCOUNTER — Encounter (HOSPITAL_COMMUNITY)
Admission: RE | Disposition: A | Discharge status: Rehabilitation Facility | Payer: Self-pay | Source: Ambulatory Visit | Attending: Orthopedic Surgery

## 2012-03-06 ENCOUNTER — Inpatient Hospital Stay (HOSPITAL_COMMUNITY): Payer: BC Managed Care – PPO | Admitting: Certified Registered Nurse Anesthetist

## 2012-03-06 ENCOUNTER — Encounter (HOSPITAL_COMMUNITY): Payer: Self-pay | Admitting: Certified Registered Nurse Anesthetist

## 2012-03-06 ENCOUNTER — Encounter (HOSPITAL_COMMUNITY): Payer: Self-pay | Admitting: *Deleted

## 2012-03-06 DIAGNOSIS — I059 Rheumatic mitral valve disease, unspecified: Secondary | ICD-10-CM

## 2012-03-06 DIAGNOSIS — Z452 Encounter for adjustment and management of vascular access device: Secondary | ICD-10-CM

## 2012-03-06 HISTORY — PX: PORT-A-CATH REMOVAL: SHX5289

## 2012-03-06 HISTORY — PX: TEE WITHOUT CARDIOVERSION: SHX5443

## 2012-03-06 LAB — RENAL FUNCTION PANEL
Albumin: 2.4 g/dL — ABNORMAL LOW (ref 3.5–5.2)
BUN: 41 mg/dL — ABNORMAL HIGH (ref 6–23)
CO2: 17 mEq/L — ABNORMAL LOW (ref 19–32)
Calcium: 8.9 mg/dL (ref 8.4–10.5)
Chloride: 96 mEq/L (ref 96–112)
Creatinine, Ser: 3.91 mg/dL — ABNORMAL HIGH (ref 0.50–1.10)
GFR calc Af Amer: 14 mL/min — ABNORMAL LOW (ref >90)
GFR calc non Af Amer: 12 mL/min — ABNORMAL LOW (ref >90)
Glucose, Bld: 93 mg/dL (ref 70–99)
Phosphorus: 6.6 mg/dL — ABNORMAL HIGH (ref 2.3–4.6)
Potassium: 3.9 mEq/L (ref 3.5–5.1)
Sodium: 131 mEq/L — ABNORMAL LOW (ref 135–145)

## 2012-03-06 LAB — CULTURE, BLOOD (ROUTINE X 2)

## 2012-03-06 LAB — PROTIME-INR
INR: 1.2 (ref 0.00–1.49)
Prothrombin Time: 15.5 seconds — ABNORMAL HIGH (ref 11.6–15.2)

## 2012-03-06 LAB — GLUCOSE, CAPILLARY
Glucose-Capillary: 90 mg/dL (ref 70–99)
Glucose-Capillary: 121 mg/dL — ABNORMAL HIGH (ref 70–99)
Glucose-Capillary: 87 mg/dL (ref 70–99)
Glucose-Capillary: 106 mg/dL — ABNORMAL HIGH (ref 70–99)

## 2012-03-06 LAB — CK: Total CK: 58 U/L (ref 7–177)

## 2012-03-06 SURGERY — REMOVAL PORT-A-CATH
Anesthesia: Monitor Anesthesia Care | Site: Chest | Wound class: Dirty or Infected

## 2012-03-06 SURGERY — ECHOCARDIOGRAM, TRANSESOPHAGEAL
Anesthesia: Moderate Sedation

## 2012-03-06 MED ORDER — HYDROMORPHONE HCL PF 1 MG/ML IJ SOLN: 0.2500 mg | INTRAMUSCULAR | Status: DC | PRN

## 2012-03-06 MED ORDER — ONDANSETRON HCL 4 MG/2ML IJ SOLN
INTRAMUSCULAR | Status: DC | PRN
  Administered 2012-03-06: 4 mg via INTRAVENOUS

## 2012-03-06 MED ORDER — ENOXAPARIN SODIUM 30 MG/0.3ML ~~LOC~~ SOLN
30.0000 mg | SUBCUTANEOUS | Status: DC
  Administered 2012-03-06 – 2012-03-07 (×2): 30 mg via SUBCUTANEOUS
  Filled 2012-03-06 (×3): qty 0.3

## 2012-03-06 MED ORDER — BUPIVACAINE-EPINEPHRINE PF 0.25-1:200000 % IJ SOLN
INTRAMUSCULAR | Status: AC
  Filled 2012-03-06: qty 30

## 2012-03-06 MED ORDER — LIDOCAINE-EPINEPHRINE (PF) 1 %-1:200000 IJ SOLN
INTRAMUSCULAR | Status: AC
  Filled 2012-03-06: qty 10

## 2012-03-06 MED ORDER — CEFAZOLIN SODIUM 1-5 GM-% IV SOLN
1.0000 g | INTRAVENOUS | Status: DC
  Administered 2012-03-06: 1 g via INTRAVENOUS
  Filled 2012-03-06 (×3): qty 50

## 2012-03-06 MED ORDER — FENTANYL CITRATE 0.05 MG/ML IJ SOLN
INTRAMUSCULAR | Status: DC | PRN
  Administered 2012-03-06: 25 ug via INTRAVENOUS

## 2012-03-06 MED ORDER — LACTATED RINGERS IV SOLN
INTRAVENOUS | Status: DC | PRN
  Administered 2012-03-06: 11:00:00 via INTRAVENOUS

## 2012-03-06 MED ORDER — BUPIVACAINE HCL (PF) 0.25 % IJ SOLN
INTRAMUSCULAR | Status: DC | PRN
  Administered 2012-03-06: 17 mL

## 2012-03-06 MED ORDER — ACETAMINOPHEN 10 MG/ML IV SOLN
1000.0000 mg | Freq: Once | INTRAVENOUS | Status: DC | PRN
  Filled 2012-03-06: qty 100

## 2012-03-06 MED ORDER — MIDAZOLAM HCL 5 MG/ML IJ SOLN
INTRAMUSCULAR | Status: AC
  Filled 2012-03-06: qty 2

## 2012-03-06 MED ORDER — BUTAMBEN-TETRACAINE-BENZOCAINE 2-2-14 % EX AERO
INHALATION_SPRAY | CUTANEOUS | Status: DC | PRN
  Administered 2012-03-06: 2 via TOPICAL

## 2012-03-06 MED ORDER — ONDANSETRON HCL 4 MG/2ML IJ SOLN: 4.0000 mg | Freq: Once | INTRAMUSCULAR | Status: DC | PRN

## 2012-03-06 MED ORDER — SODIUM CHLORIDE 0.9 % IV SOLN: INTRAVENOUS | Status: DC

## 2012-03-06 MED ORDER — BUPIVACAINE HCL (PF) 0.25 % IJ SOLN
INTRAMUSCULAR | Status: AC
  Filled 2012-03-06: qty 30

## 2012-03-06 MED ORDER — LACTATED RINGERS IV SOLN
INTRAVENOUS | Status: DC
  Administered 2012-03-06: 10:00:00 via INTRAVENOUS

## 2012-03-06 MED ORDER — SODIUM CHLORIDE 0.9 % IV SOLN
200.0000 ug | INTRAVENOUS | Status: DC | PRN
  Administered 2012-03-06: 12 ug via INTRAVENOUS

## 2012-03-06 MED ORDER — MIDAZOLAM HCL 10 MG/2ML IJ SOLN
INTRAMUSCULAR | Status: DC | PRN
  Administered 2012-03-06 (×2): 2 mg via INTRAVENOUS

## 2012-03-06 MED ORDER — MIDAZOLAM HCL 5 MG/5ML IJ SOLN
INTRAMUSCULAR | Status: DC | PRN
  Administered 2012-03-06: 1 mg via INTRAVENOUS

## 2012-03-06 MED ORDER — 0.9 % SODIUM CHLORIDE (POUR BTL) OPTIME
TOPICAL | Status: DC | PRN
  Administered 2012-03-06: 1000 mL

## 2012-03-06 MED ORDER — WARFARIN SODIUM 10 MG PO TABS
10.0000 mg | ORAL_TABLET | Freq: Once | ORAL | Status: AC
  Administered 2012-03-06: 10 mg via ORAL
  Filled 2012-03-06: qty 1

## 2012-03-06 MED ORDER — DIPHENHYDRAMINE HCL 50 MG/ML IJ SOLN
INTRAMUSCULAR | Status: AC
  Filled 2012-03-06: qty 1

## 2012-03-06 MED ORDER — DEXMEDETOMIDINE HCL IN NACL 200 MCG/50ML IV SOLN
0.4000 ug/kg/h | INTRAVENOUS | Status: DC
  Filled 2012-03-06: qty 50

## 2012-03-06 MED ORDER — FENTANYL CITRATE 0.05 MG/ML IJ SOLN
INTRAMUSCULAR | Status: AC
  Filled 2012-03-06: qty 4

## 2012-03-06 MED ORDER — FENTANYL CITRATE 0.05 MG/ML IJ SOLN
INTRAMUSCULAR | Status: DC | PRN
  Administered 2012-03-06: 50 ug via INTRAVENOUS

## 2012-03-06 MED ORDER — WARFARIN - PHARMACIST DOSING INPATIENT
Freq: Every day | Status: DC
  Administered 2012-03-06: 18:00:00

## 2012-03-06 SURGICAL SUPPLY — 47 items
ADH SKN CLS APL DERMABOND .7 (GAUZE/BANDAGES/DRESSINGS) ×1
APL SKNCLS STERI-STRIP NONHPOA (GAUZE/BANDAGES/DRESSINGS)
BENZOIN TINCTURE PRP APPL 2/3 (GAUZE/BANDAGES/DRESSINGS) ×1 IMPLANT
BLADE SURG 15 STRL LF DISP TIS (BLADE) ×1 IMPLANT
BLADE SURG 15 STRL SS (BLADE) ×2
CHLORAPREP W/TINT 10.5 ML (MISCELLANEOUS) ×2 IMPLANT
CLOTH BEACON ORANGE TIMEOUT ST (SAFETY) ×2 IMPLANT
COVER SURGICAL LIGHT HANDLE (MISCELLANEOUS) ×2 IMPLANT
DECANTER SPIKE VIAL GLASS SM (MISCELLANEOUS) ×2 IMPLANT
DERMABOND ADVANCED (GAUZE/BANDAGES/DRESSINGS) ×1
DERMABOND ADVANCED .7 DNX12 (GAUZE/BANDAGES/DRESSINGS) IMPLANT
DRAPE PED LAPAROTOMY (DRAPES) ×2 IMPLANT
ELECT CAUTERY BLADE 6.4 (BLADE) ×2 IMPLANT
ELECT REM PT RETURN 9FT ADLT (ELECTROSURGICAL) ×2
ELECTRODE REM PT RTRN 9FT ADLT (ELECTROSURGICAL) ×1 IMPLANT
GAUZE SPONGE 2X2 8PLY STRL LF (GAUZE/BANDAGES/DRESSINGS) ×1 IMPLANT
GAUZE SPONGE 4X4 16PLY XRAY LF (GAUZE/BANDAGES/DRESSINGS) ×2 IMPLANT
GLOVE BIO SURGEON STRL SZ 6.5 (GLOVE) ×1 IMPLANT
GLOVE BIOGEL PI IND STRL 6.5 (GLOVE) IMPLANT
GLOVE BIOGEL PI IND STRL 7.5 (GLOVE) IMPLANT
GLOVE BIOGEL PI IND STRL 8 (GLOVE) ×1 IMPLANT
GLOVE BIOGEL PI INDICATOR 6.5 (GLOVE) ×1
GLOVE BIOGEL PI INDICATOR 7.5 (GLOVE) ×1
GLOVE BIOGEL PI INDICATOR 8 (GLOVE) ×1
GLOVE ECLIPSE 8.0 STRL XLNG CF (GLOVE) ×2 IMPLANT
GLOVE SURG SS PI 7.0 STRL IVOR (GLOVE) ×1 IMPLANT
GOWN STRL NON-REIN LRG LVL3 (GOWN DISPOSABLE) ×5 IMPLANT
KIT BASIN OR (CUSTOM PROCEDURE TRAY) ×2 IMPLANT
KIT ROOM TURNOVER OR (KITS) ×2 IMPLANT
NDL HYPO 25GX1X1/2 BEV (NEEDLE) ×1 IMPLANT
NEEDLE HYPO 25GX1X1/2 BEV (NEEDLE) ×2 IMPLANT
NS IRRIG 1000ML POUR BTL (IV SOLUTION) ×2 IMPLANT
PACK SURGICAL SETUP 50X90 (CUSTOM PROCEDURE TRAY) ×2 IMPLANT
PAD ARMBOARD 7.5X6 YLW CONV (MISCELLANEOUS) ×3 IMPLANT
PENCIL BUTTON HOLSTER BLD 10FT (ELECTRODE) ×2 IMPLANT
SPONGE GAUZE 2X2 STER 10/PKG (GAUZE/BANDAGES/DRESSINGS) ×1
SPONGE GAUZE 4X4 12PLY (GAUZE/BANDAGES/DRESSINGS) ×1 IMPLANT
STRIP CLOSURE SKIN 1/2X4 (GAUZE/BANDAGES/DRESSINGS) ×2 IMPLANT
SUT ETHILON 3 0 FSL (SUTURE) ×1 IMPLANT
SUT MON AB 4-0 PC3 18 (SUTURE) ×2 IMPLANT
SUT VIC AB 3-0 SH 27 (SUTURE) ×2
SUT VIC AB 3-0 SH 27X BRD (SUTURE) ×1 IMPLANT
SYR CONTROL 10ML LL (SYRINGE) ×2 IMPLANT
TAPE CLOTH SURG 4X10 WHT LF (GAUZE/BANDAGES/DRESSINGS) ×1 IMPLANT
TOWEL OR 17X24 6PK STRL BLUE (TOWEL DISPOSABLE) ×2 IMPLANT
TOWEL OR 17X26 10 PK STRL BLUE (TOWEL DISPOSABLE) ×2 IMPLANT
WATER STERILE IRR 1000ML POUR (IV SOLUTION) IMPLANT

## 2012-03-06 NOTE — Progress Notes (Signed)
INFECTIOUS DISEASE PROGRESS NOTE  ID: JODETTE BRANDWEIN is a 51 y.o. female with   Principal Problem:  *Infected prosthetic knee joint Active Problems:  Cancer of upper-outer quadrant of female breast  Dehydration  Hypokalemia  Hyponatremia  Bacteremia  Subjective: C/o nausea  Abtx:  Anti-infectives     Start     Dose/Rate Route Frequency Ordered Stop   03/06/12 2300   DAPTOmycin (CUBICIN) 500 mg in sodium chloride 0.9 % IVPB        500 mg 220 mL/hr over 30 Minutes Intravenous Every 24 hours 03/05/12 0856     03/06/12 1200   DAPTOmycin (CUBICIN) 500 mg in sodium chloride 0.9 % IVPB  Status:  Discontinued        500 mg 220 mL/hr over 30 Minutes Intravenous Every 24 hours 03/05/12 0850 03/05/12 0856   03/05/12 1000   rifampin (RIFADIN) capsule 300 mg        300 mg Oral Daily 03/05/12 0915     03/03/12 0400   vancomycin (VANCOCIN) IVPB 1000 mg/200 mL premix  Status:  Discontinued        1,000 mg 200 mL/hr over 60 Minutes Intravenous Every 12 hours 03/02/12 1946 03/04/12 1134   03/02/12 1656   polymyxin B 500,000 Units, bacitracin 50,000 Units in sodium chloride irrigation 0.9 % 500 mL irrigation  Status:  Discontinued          As needed 03/02/12 1656 03/02/12 1741          Medications:  Scheduled:   . DAPTOmycin (CUBICIN)  IV  500 mg Intravenous Q24H  . docusate sodium  100 mg Oral BID  . DULoxetine  60 mg Oral Daily  . multivitamin with minerals  1 tablet Oral Daily  . pantoprazole  80 mg Oral Q1200  . rifampin  300 mg Oral Daily  . DISCONTD: Warfarin - Pharmacist Dosing Inpatient   Does not apply q1800    Objective: Vital signs in last 24 hours: Temp:  [97.3 F (36.3 C)-98.5 F (36.9 C)] 97.8 F (36.6 C) (09/17 0815) Pulse Rate:  [69-82] 69  (09/17 0815) Resp:  [14-16] 16  (09/16 1923) BP: (125-134)/(49-61) 130/50 mmHg (09/17 0815) SpO2:  [90 %-98 %] 97 % (09/17 0815)   General appearance: alert, cooperative, fatigued and mild distress Resp: rhonchi  LUL Cardio: regular rate and rhythm GI: normal findings: bowel sounds normal and soft, non-tender  Lab Results  Basename 03/06/12 0408 03/05/12 0905 03/05/12 0021  WBC -- 5.0 5.9  HGB -- 9.5* 9.1*  HCT -- 27.8* 26.4*  NA 131* 129* --  K 3.9 3.7 --  CL 96 91* --  CO2 17* 21 --  BUN 41* 35* --  CREATININE 3.91* 4.25* --  GLU -- -- --   Liver Panel  Basename 03/06/12 0408 03/05/12 0905 03/04/12 0908  PROT -- 6.9 6.3  ALBUMIN 2.4* 2.6* --  AST -- 32 29  ALT -- 34 34  ALKPHOS -- 549* 419*  BILITOT -- 0.6 1.0  BILIDIR -- -- --  IBILI -- -- --   Sedimentation Rate No results found for this basename: ESRSEDRATE in the last 72 hours C-Reactive Protein No results found for this basename: CRP:2 in the last 72 hours  Microbiology: Recent Results (from the past 240 hour(s))  SURGICAL PCR SCREEN     Status: Abnormal   Collection Time   03/02/12  1:35 PM      Component Value Range Status Comment   MRSA,  PCR NEGATIVE  NEGATIVE Final    Staphylococcus aureus POSITIVE (*) NEGATIVE Final   ANAEROBIC CULTURE     Status: Normal (Preliminary result)   Collection Time   03/02/12  4:20 PM      Component Value Range Status Comment   Specimen Description SYNOVIAL FLUID KNEE LEFT   Final    Special Requests RECEIVED ON SWAB   Final    Gram Stain     Final    Value: MODERATE WBC PRESENT, PREDOMINANTLY PMN     RARE GRAM POSITIVE COCCI     IN PAIRS Gram Stain Report Called to,Read Back By and Verified With: Gram Stain Report Called to,Read Back By and Verified With: DR GRAVES AT 1652 03/02/12 BY ROMEROJ Performed at [facility]   Culture     Final    Value: NO ANAEROBES ISOLATED; CULTURE IN PROGRESS FOR 5 DAYS   Report Status PENDING   Incomplete   BODY FLUID CULTURE     Status: Normal   Collection Time   03/02/12  4:20 PM      Component Value Range Status Comment   Specimen Description SYNOVIAL FLUID KNEE LEFT   Final    Special Requests RECEIVED ON SWAB   Final    Gram Stain      Final    Value: MODERATE WBC PRESENT, PREDOMINANTLY PMN     RARE GRAM POSITIVE COCCI     IN PAIRS Gram Stain Report Called to,Read Back By and Verified With: Gram Stain Report Called to,Read Back By and Verified With: DR GRAVES AT 1652 03/02/12 BY ROMEROJ Performed at [facility]   Culture     Final    Value: MODERATE STAPHYLOCOCCUS AUREUS     Note: RIFAMPIN AND GENTAMICIN SHOULD NOT BE USED AS SINGLE DRUGS FOR TREATMENT OF STAPH INFECTIONS.   Report Status 03/04/2012 FINAL   Final    Organism ID, Bacteria STAPHYLOCOCCUS AUREUS   Final   GRAM STAIN     Status: Normal   Collection Time   03/02/12  4:20 PM      Component Value Range Status Comment   Specimen Description SYNOVIAL FLUID KNEE LEFT   Final    Special Requests NONE   Final    Gram Stain     Final    Value: MODERATE WBC PRESENT, PREDOMINANTLY PMN     RARE GRAM POSITIVE COCCI IN PAIRS     CALLED TO DR. Luiz Blare 9.13.13 AT 1652 BY ROMEROJ   Report Status 03/02/2012 FINAL   Final   CULTURE, BLOOD (ROUTINE X 2)     Status: Normal   Collection Time   03/03/12 12:25 AM      Component Value Range Status Comment   Specimen Description BLOOD RIGHT HAND   Final    Special Requests BOTTLES DRAWN AEROBIC AND ANAEROBIC 5CC EACH   Final    Culture  Setup Time 03/03/2012 11:01   Final    Culture     Final    Value: STAPHYLOCOCCUS AUREUS     Note: RIFAMPIN AND GENTAMICIN SHOULD NOT BE USED AS SINGLE DRUGS FOR TREATMENT OF STAPH INFECTIONS. This organism DOES NOT demonstrate inducible Clindamycin resistance in vitro.     Note: Gram Stain Report Called to,Read Back By and Verified With: ERIN ARNOLD 03/04/12 @ 11:30AM BY RUSCA.   Report Status 03/06/2012 FINAL   Final    Organism ID, Bacteria STAPHYLOCOCCUS AUREUS   Final   CULTURE, BLOOD (ROUTINE X 2)  Status: Normal   Collection Time   03/03/12 12:40 AM      Component Value Range Status Comment   Specimen Description BLOOD RIGHT HAND   Final    Special Requests BOTTLES DRAWN AEROBIC  AND ANAEROBIC 3CC EACH   Final    Culture  Setup Time 03/03/2012 11:01   Final    Culture     Final    Value: STAPHYLOCOCCUS AUREUS     Note: SUSCEPTIBILITIES PERFORMED ON PREVIOUS CULTURE WITHIN THE LAST 5 DAYS.     Note: Gram Stain Report Called to,Read Back By and Verified With: ERIN ARNOLD 03/04/12 @ 11:30AM BY RUSCA.   Report Status 03/06/2012 FINAL   Final   URINE CULTURE     Status: Normal   Collection Time   03/04/12  3:50 AM      Component Value Range Status Comment   Specimen Description URINE, CATHETERIZED   Final    Special Requests PATIENT ON FOLLOWING VANCOMYCIN   Final    Culture  Setup Time 03/04/2012 11:16   Final    Colony Count NO GROWTH   Final    Culture NO GROWTH   Final    Report Status 03/05/2012 FINAL   Final     Studies/Results: Ct Head Wo Contrast  03/05/2012  *RADIOLOGY REPORT*  Clinical Data:  Altered mental status.  Breast cancer.  CT HEAD WITHOUT CONTRAST  Technique:  Contiguous axial images were obtained from the base of the skull through the vertex without contrast.  Comparison: None.  Findings: There is no evidence for acute infarction, intracranial hemorrhage, mass lesion, hydrocephalus, or extra-axial fluid. Borderline mild atrophy.  No definite white matter disease.  No osseous destructive lesion.  Sinuses and mastoids clear.  IMPRESSION: Borderline mild atrophy.  No acute intracranial findings.   Original Report Authenticated By: Elsie Stain, M.D.    Dg Chest Port 1 View  03/04/2012  *RADIOLOGY REPORT*  Clinical Data: Bacteremia.  Infected knee prosthesis.  Acute renal insufficiency.  PORTABLE CHEST - 1 VIEW 03/04/2012 1309 hours:  Comparison: Portable chest x-ray 01/16/2012 and two-view chest x- ray 08/11/2011.  Findings: Suboptimal inspiration due to body habitus which accounts for crowded bronchovascular markings at the bases and accentuates the cardiac silhouette.  Taking this into account, cardiac silhouette enlarged but stable.  Lungs clear.   Pulmonary venous hypertension without overt edema.  Right subclavian Port-A-Cath tip projects over the lower SVC.  IMPRESSION: Suboptimal inspiration.  Stable cardiomegaly.  No acute cardiopulmonary disease.   Original Report Authenticated By: Arnell Sieving, M.D.      Assessment/Plan: Septic Arthritis/Osteomyelitis (staph aureus)  L TKR Staph aureus bacteremia 2/2  ARF  Breast Cancer (L) T1 N1 (stage II)  MS  Total days of antibiotics 5 (vancomycin --> cubicin/rifampin)  Would-  Appreciate surgical eval for removal of port (today) TEE- today Watch Cr, slightly better today Will change cubicin to ancef Add Rifampin due to presence of prosthetic joint, watch LFTs. Could this be cause of nausea? Watch coumadin/rifampin interactions  Johny Sax Infectious Diseases [phone] 03/06/2012, 9:15 AM   LOS: 4 days

## 2012-03-06 NOTE — Interval H&P Note (Signed)
History and Physical Interval Note:  03/06/2012 2:58 PM  Julie Ross  has presented today for surgery, with the diagnosis of Endocarditis  The various methods of treatment have been discussed with the patient and family. After consideration of risks, benefits and other options for treatment, the patient has consented to  Procedure(s) (LRB) with comments: TRANSESOPHAGEAL ECHOCARDIOGRAM (TEE) (N/A) - Rm. 2927 as a surgical intervention .  The patient's history has been reviewed, patient examined, no change in status, stable for surgery.  I have reviewed the patient's chart and labs.  Questions were answered to the patient's satisfaction.     Charlton Haws

## 2012-03-06 NOTE — Progress Notes (Signed)
[facility] Consult F/U Note [place] TEAM 1 - Stepdown/ICU TEAM   Julie Ross ZOX:[id number] DOB: [birth year] DOA: 03/02/2012 PCP: [name]  Brief narrative: 51 yo female who was admitted by Ortho with knee pain. She presented w/ an acutely swollen and painful knee. She subsequently underwent an extensive 4-compartment synovectomy with irrigation and debridement of subcutaneous tissue, fascia, muscle, and bone. After her surgery, she became less responsive on the floor, had some lab values that were not normal, and [facility] was consulted to help manage patient medically.   Assessment/Plan:  Staph aureus bacteremia + septic arthritis - r/o endocarditits - port-a-cath has been removed today- TTE was w/o evidence of vegetation, therefore a TEE was performed today- it did not reveal endocarditis - will need PICC for long term abx tx but will defer to ID as to the most appropriate time to place this (pt has functional periph IV at this time)   Infected L TKR (original surgery Feb 2013) As per Ortho  Hypotension Resolved w/ volume - follow trend  thrombocytopenia Improving with tx of infection and in absence of chemo - follow trend  Acute kidney failure ATN due to vanc toxicity in conjunction with ARB/vol depletion - Nephrology is following   Hyponatremia Improving w/ volume expansion  Hypokalemia Improved w/ replacement   Anemia hgb stable/climbing  Breast CA undergoing chemo I spoke w/ Dr. Park Breed who recommends holding all tx at this time (including herceptin) - pt to f/u with her once d/c from hospital  Toxic metabolic encephalopathy Likely due to hypovolemia/hypoperfusion + acute renal failure + bactermia - MS improving this morning  Multiple Sclerosis Well compensated at present - follow clinically   Code Status: Full Disposition Plan: reamin in SDU for today  Consultants: [facility] ID Nephrology  Procedures: 03/05/2012 - TTE - no evidence of vegitation -  preserved LV fxn 03/02/2012 - Revision total knee, 1 component - Extensive 4-compartment synovectomy with irrigation and debridement  of subcutaneous tissue, fascia, muscle, and bone  Antibiotics: daptomycin 03/06/2012>> Rifampin 03/05/2012>>  DVT prophylaxis: SCDs - and lovenox  HPI/Subjective: The patient is quite sedated after TEE. No complaints. I spoke with her husband in detail and answered all questions.    Objective: Blood pressure 114/49, pulse 66, temperature 97.9 F (36.6 C), temperature source Oral, resp. rate 8, height 5\' 6"  (1.676 m), weight 88.451 kg (195 lb), last menstrual period 12/05/2011, SpO2 98.00%.  Intake/Output Summary (Last 24 hours) at 03/06/12 1941 Last data filed at 03/06/12 1800  Gross per 24 hour  Intake 3609.67 ml  Output   1920 ml  Net 1689.67 ml     Exam: General: No acute respiratory distress at rest Lungs: Clear to auscultation bilaterally without wheezes or crackles Cardiovascular: Regular rate and rhythm without murmur gallop or rub normal S1 and S2 Abdomen: Nontender, nondistended, soft, bowel sounds positive, no rebound, no ascites, no appreciable mass Extremities: No significant cyanosis, clubbing, or edema bilateral lower extremities  Data Reviewed: Basic Metabolic Panel:  Lab 03/06/12 8119 03/05/12 0905 03/05/12 0016 03/04/12 1503 03/04/12 0908  NA 131* 129* 128* 127* 125*  K 3.9 3.7 3.8 3.9 3.8  CL 96 91* 92* 90* 89*  CO2 17* 21 20 21 22   GLUCOSE 93 110* 138* 149* 163*  BUN 41* 35* 30* 25* 21  CREATININE 3.91* 4.25* 4.10* 3.78* 3.42*  CALCIUM 8.9 9.0 8.7 8.6 8.5  MG -- -- -- -- --  PHOS 6.6* -- 7.7* -- --   Liver  Function Tests:  Lab 03/06/12 0408 03/05/12 0905 03/05/12 0016 03/04/12 0908 03/02/12 1402  AST -- 32 -- 29 45*  ALT -- 34 -- 34 57*  ALKPHOS -- 549* -- 419* 317*  BILITOT -- 0.6 -- 1.0 2.0*  PROT -- 6.9 -- 6.3 6.9  ALBUMIN 2.4* 2.6* 2.4* 2.5* 3.0*   CBC:  Lab 03/05/12 0905 03/05/12 0021 03/04/12 1622  03/04/12 0440 03/03/12 0545 03/02/12 1403  WBC 5.0 5.9 6.8 6.3 7.3 --  NEUTROABS 3.5 -- -- -- -- 6.6  HGB 9.5* 9.1* 7.9* 8.5* 7.5* --  HCT 27.8* 26.4* 23.3* 24.8* 22.4* --  MCV 81.8 82.2 80.3 81.0 79.7 --  PLT 103* 90* 81* 89* 89* --    Recent Results (from the past 240 hour(s))  SURGICAL PCR SCREEN     Status: Abnormal   Collection Time   03/02/12  1:35 PM      Component Value Range Status Comment   MRSA, PCR NEGATIVE  NEGATIVE Final    Staphylococcus aureus POSITIVE (*) NEGATIVE Final   ANAEROBIC CULTURE     Status: Normal (Preliminary result)   Collection Time   03/02/12  4:20 PM      Component Value Range Status Comment   Specimen Description SYNOVIAL FLUID KNEE LEFT   Final    Special Requests RECEIVED ON SWAB   Final    Gram Stain     Final    Value: MODERATE WBC PRESENT, PREDOMINANTLY PMN     RARE GRAM POSITIVE COCCI     IN PAIRS Gram Stain Report Called to,Read Back By and Verified With: Gram Stain Report Called to,Read Back By and Verified With: DR GRAVES AT 1652 03/02/12 BY ROMEROJ Performed at [facility]   Culture     Final    Value: NO ANAEROBES ISOLATED; CULTURE IN PROGRESS FOR 5 DAYS   Report Status PENDING   Incomplete   BODY FLUID CULTURE     Status: Normal   Collection Time   03/02/12  4:20 PM      Component Value Range Status Comment   Specimen Description SYNOVIAL FLUID KNEE LEFT   Final    Special Requests RECEIVED ON SWAB   Final    Gram Stain     Final    Value: MODERATE WBC PRESENT, PREDOMINANTLY PMN     RARE GRAM POSITIVE COCCI     IN PAIRS Gram Stain Report Called to,Read Back By and Verified With: Gram Stain Report Called to,Read Back By and Verified With: DR GRAVES AT 1652 03/02/12 BY ROMEROJ Performed at [facility]   Culture     Final    Value: MODERATE STAPHYLOCOCCUS AUREUS     Note: RIFAMPIN AND GENTAMICIN SHOULD NOT BE USED AS SINGLE DRUGS FOR TREATMENT OF STAPH INFECTIONS.   Report Status 03/04/2012 FINAL   Final    Organism ID,  Bacteria STAPHYLOCOCCUS AUREUS   Final   GRAM STAIN     Status: Normal   Collection Time   03/02/12  4:20 PM      Component Value Range Status Comment   Specimen Description SYNOVIAL FLUID KNEE LEFT   Final    Special Requests NONE   Final    Gram Stain     Final    Value: MODERATE WBC PRESENT, PREDOMINANTLY PMN     RARE GRAM POSITIVE COCCI IN PAIRS     CALLED TO DR. Luiz Blare 9.13.13 AT 1652 BY ROMEROJ   Report Status 03/02/2012 FINAL  Final   CULTURE, BLOOD (ROUTINE X 2)     Status: Normal   Collection Time   03/03/12 12:25 AM      Component Value Range Status Comment   Specimen Description BLOOD RIGHT HAND   Final    Special Requests BOTTLES DRAWN AEROBIC AND ANAEROBIC 5CC EACH   Final    Culture  Setup Time 03/03/2012 11:01   Final    Culture     Final    Value: STAPHYLOCOCCUS AUREUS     Note: RIFAMPIN AND GENTAMICIN SHOULD NOT BE USED AS SINGLE DRUGS FOR TREATMENT OF STAPH INFECTIONS. This organism DOES NOT demonstrate inducible Clindamycin resistance in vitro.     Note: Gram Stain Report Called to,Read Back By and Verified With: ERIN ARNOLD 03/04/12 @ 11:30AM BY RUSCA.   Report Status 03/06/2012 FINAL   Final    Organism ID, Bacteria STAPHYLOCOCCUS AUREUS   Final   CULTURE, BLOOD (ROUTINE X 2)     Status: Normal   Collection Time   03/03/12 12:40 AM      Component Value Range Status Comment   Specimen Description BLOOD RIGHT HAND   Final    Special Requests BOTTLES DRAWN AEROBIC AND ANAEROBIC 3CC EACH   Final    Culture  Setup Time 03/03/2012 11:01   Final    Culture     Final    Value: STAPHYLOCOCCUS AUREUS     Note: SUSCEPTIBILITIES PERFORMED ON PREVIOUS CULTURE WITHIN THE LAST 5 DAYS.     Note: Gram Stain Report Called to,Read Back By and Verified With: ERIN ARNOLD 03/04/12 @ 11:30AM BY RUSCA.   Report Status 03/06/2012 FINAL   Final   URINE CULTURE     Status: Normal   Collection Time   03/04/12  3:50 AM      Component Value Range Status Comment   Specimen Description  URINE, CATHETERIZED   Final    Special Requests PATIENT ON FOLLOWING VANCOMYCIN   Final    Culture  Setup Time 03/04/2012 11:16   Final    Colony Count NO GROWTH   Final    Culture NO GROWTH   Final    Report Status 03/05/2012 FINAL   Final      Studies:  Recent x-ray studies have been reviewed in detail by the Attending Physician  Scheduled Meds:  Reviewed in detail by the Attending Physician   Lonia Blood, MD [facility] Office  [phone] Pager [phone]  On-Call/Text Page:      [email or web address]      password [email or web address]  If 7PM-7AM, please contact night-coverage [email or web address] Password [email or web address] 03/06/2012, 7:41 PM   LOS: 4 days

## 2012-03-06 NOTE — Progress Notes (Signed)
Subjective:  BP improved. Improved UOP in last 24 hours. Having increased nausea this morning.  Patient back from her portacath removal and resting Objective Vital signs in last 24 hours: Filed Vitals:   03/05/12 1639 03/05/12 1923 03/06/12 0100 03/06/12 0400  BP: 131/61 130/49  125/56  Pulse: 82 80    Temp: 98.5 F (36.9 C) 97.8 F (36.6 C) 97.7 F (36.5 C) 97.3 F (36.3 C)  TempSrc: Oral Oral Oral Oral  Resp: 14 16    Height:      Weight:      SpO2: 98% 98%     Weight change:   Intake/Output Summary (Last 24 hours) at 03/06/12 0744 Last data filed at 03/06/12 0600  Gross per 24 hour  Intake   3760 ml  Output   1540 ml  Net   2220 ml   Labs: Basic Metabolic Panel:  Lab 03/06/12 0981 03/05/12 0905 03/05/12 0016  NA 131* 129* 128*  K 3.9 3.7 3.8  CL 96 91* 92*  CO2 17* 21 20  GLUCOSE 93 110* 138*  BUN 41* 35* 30*  CREATININE 3.91* 4.25* 4.10*  CALCIUM 8.9 9.0 8.7  ALB -- -- --  PHOS 6.6* -- 7.7*   Liver Function Tests:  Lab 03/06/12 0408 03/05/12 0905 03/05/12 0016 03/04/12 0908 03/02/12 1402  AST -- 32 -- 29 45*  ALT -- 34 -- 34 57*  ALKPHOS -- 549* -- 419* 317*  BILITOT -- 0.6 -- 1.0 2.0*  PROT -- 6.9 -- 6.3 6.9  ALBUMIN 2.4* 2.6* 2.4* -- --   No results found for this basename: LIPASE:3,AMYLASE:3 in the last 168 hours No results found for this basename: AMMONIA:3 in the last 168 hours CBC:  Lab 03/05/12 0905 03/05/12 0021 03/04/12 1622 03/04/12 0440 03/03/12 0545 03/02/12 1403  WBC 5.0 5.9 6.8 -- -- --  NEUTROABS 3.5 -- -- -- -- 6.6  HGB 9.5* 9.1* 7.9* -- -- --  HCT 27.8* 26.4* 23.3* -- -- --  MCV 81.8 82.2 80.3 81.0 79.7 --  PLT 103* 90* 81* -- -- --   Cardiac Enzymes:  Lab 03/06/12 0408  CKTOTAL 58  CKMB --  CKMBINDEX --  TROPONINI --   CBG: No results found for this basename: GLUCAP:5 in the last 168 hours  Iron Studies: No results found for this basename: IRON,TIBC,TRANSFERRIN,FERRITIN in the last 72 hours Studies/Results: Ct Head Wo  Contrast  03/05/2012  *RADIOLOGY REPORT*  Clinical Data:  Altered mental status.  Breast cancer.  CT HEAD WITHOUT CONTRAST  Technique:  Contiguous axial images were obtained from the base of the skull through the vertex without contrast.  Comparison: None.  Findings: There is no evidence for acute infarction, intracranial hemorrhage, mass lesion, hydrocephalus, or extra-axial fluid. Borderline mild atrophy.  No definite white matter disease.  No osseous destructive lesion.  Sinuses and mastoids clear.  IMPRESSION: Borderline mild atrophy.  No acute intracranial findings.   Original Report Authenticated By: Elsie Stain, M.D.    Dg Chest Port 1 View  03/04/2012  *RADIOLOGY REPORT*  Clinical Data: Bacteremia.  Infected knee prosthesis.  Acute renal insufficiency.  PORTABLE CHEST - 1 VIEW 03/04/2012 1309 hours:  Comparison: Portable chest x-ray 01/16/2012 and two-view chest x- ray 08/11/2011.  Findings: Suboptimal inspiration due to body habitus which accounts for crowded bronchovascular markings at the bases and accentuates the cardiac silhouette.  Taking this into account, cardiac silhouette enlarged but stable.  Lungs clear.  Pulmonary venous hypertension without overt edema.  Right subclavian Port-A-Cath tip projects over the lower SVC.  IMPRESSION: Suboptimal inspiration.  Stable cardiomegaly.  No acute cardiopulmonary disease.   Original Report Authenticated By: Arnell Sieving, M.D.    Medications: Infusions:    . sodium chloride 100 mL (03/04/12 2250)    Scheduled Medications:    . DAPTOmycin (CUBICIN)  IV  500 mg Intravenous Q24H  . docusate sodium  100 mg Oral BID  . DULoxetine  60 mg Oral Daily  . multivitamin with minerals  1 tablet Oral Daily  . pantoprazole  80 mg Oral Q1200  . rifampin  300 mg Oral Daily  . DISCONTD: DAPTOmycin (CUBICIN)  IV  500 mg Intravenous Q24H  . DISCONTD: Warfarin - Pharmacist Dosing Inpatient   Does not apply q1800    have reviewed scheduled and prn  medications.  Physical Exam: General: Awake, alert but appears uncomfortable. Resting when I see her Heart: RRR Abdomen: soft, positive bowel sounds  Extremities: Left leg wrapped with ACE. SCD in place on left leg. Trace edema. Foley leg strap on right leg. GU: Foley in place with yellow urine in bag  Assessment/ Plan: Pt is a 51 y.o. yo female who was admitted on 03/02/2012 with infected prosthetic knee ARF due to vancomycin use plus ARB/volume depletion  1. ARF- Baseline renal function is normal. Today, Cr is slightly improved to 3.91 (down from 4.25 yesterday) U/A showed granular casts - was on ARB and had supratherapuetic vanc level as well as low BPs.  - Urine output is improving greatly with 1500cc out in last 24 hours - Continue supportive care- all meds that can cause low BP have been D/Cd.   - No indications for HD and it appears she is improving at this time.  I am hopeful for continued improvement 2. HTN/vol- BP improving. Holding all BP and BP lowering meds 3. Anemia- Hgb 9.1 yesterday. Awaiting AM labs today; will follow- no ESA 4. ID- Staph aureus bacteremia- abx per ID- ancef, rifampin and s/p portacath removal.  Echo negative for vegetation  HAIRFORD, AMBER  03/06/2012,7:44 AM  LOS: 4 days   Patient seen and examined, agree with above note with above modifications. 50 year old WF with normal renal function at baseline now with ARF in the setting of hypotension on ARB and supratherapuetic vanc level.  Thankfully UOP has picked up and creatinine is down.  Would prefer not to keep high dose IVF for too long, will put limit on administration Annie Sable, MD 03/06/2012

## 2012-03-06 NOTE — Progress Notes (Signed)
PT Cancellation Note  Treatment cancelled today due to patient for port-a-cath removal in the am and TEE in the pm.  Will check on patient tomorrow.  Thanks.  INGOLD,Tayson Schnelle 03/06/2012, 3:44 PM  [facility] [phone] (pager)

## 2012-03-06 NOTE — Anesthesia Preprocedure Evaluation (Addendum)
Anesthesia Evaluation  Patient identified by MRN, date of birth, ID band Patient awake    Reviewed: Allergy & Precautions, H&P , NPO status , Patient's Chart, lab work & pertinent test results  Airway Mallampati: II TM Distance: >3 FB Neck ROM: Full    Dental  (+) Teeth Intact and Dental Advisory Given   Pulmonary sleep apnea ,    Pulmonary exam normal       Cardiovascular hypertension, Pt. on medications     Neuro/Psych Depression    GI/Hepatic Neg liver ROS, GERD-  Medicated and Controlled,  Endo/Other  negative endocrine ROS  Renal/GU Cr elevated with recent admission and infection     Musculoskeletal   Abdominal Normal abdominal exam  (+)   Peds  Hematology   Anesthesia Other Findings   Reproductive/Obstetrics Breast CA s/p left mastectomy 12/2011 Chemo 02/17/12                          Anesthesia Physical Anesthesia Plan  ASA: III  Anesthesia Plan: MAC   Post-op Pain Management:    Induction: Intravenous  Airway Management Planned: Simple Face Mask  Additional Equipment:   Intra-op Plan:   Post-operative Plan:   Informed Consent: I have reviewed the patients History and Physical, chart, labs and discussed the procedure including the risks, benefits and alternatives for the proposed anesthesia with the patient or authorized representative who has indicated his/her understanding and acceptance.   Dental advisory given  Plan Discussed with: Anesthesiologist and Surgeon  Anesthesia Plan Comments:         Anesthesia Quick Evaluation

## 2012-03-06 NOTE — Op Note (Signed)
Operative Note  Julie Ross female 51 y.o. 03/06/2012  PREOPERATIVE DX:  Bacteremia and indwelling Port-A-Cath  POSTOPERATIVE DX:  Same  PROCEDURE:  Port-A-Cath removal         Surgeon: Adolph Pollack   Assistants: Alvera Singh physician assistant student  Anesthesia: Monitored Local Anesthesia with Sedation  Indications:   This is a 51 year old female who had a knee replacement in February. She also had breast cancer and has been on chemotherapy for the past couple months. She came in with bacteremia and infected knee prosthesis. It has been recommended that the indwelling Port-A-Cath be removed as well as she has positive blood cultures.  She now presents for that.    Procedure Detail:  She is brought to the operating room on her ICU bed. She was given intravenous sedation. The right neck and chest were the Port-A-Cath was placed her sterilely prepped and draped.  Local anesthetic (1% Xylocaine) was infiltrated superficially and deep at the site of the Port-A-Cath scar. This is in the right upper chest wall. The scar was re\re incised and the subcutaneous tissue divided. The catheter was then identified and the fibrous sheath dissected free from it. The catheter was removed and direct pressure was held over the subclavian vein puncture site for approximately 10 minutes.  Using electrocautery, the fiber sheath around the Port-A-Cath was incised and the port and catheter were removed intact. A small clot was noted projecting from the tip of the catheter.  Bleeding was controlled with electrocautery. Once hemostasis was adequate the wounds and closed loosely with 3 interrupted 3-0 nylon sutures. I then packed the gaps with an saline moistened gauze. A bulky dry dressing was applied.  She tolerated the procedure well without any apparent complications and was taken to the recovery room in satisfactory condition.  Estimated Blood Loss:  Minimal         Drains: none  Blood  Given: none          Specimens: Port-A-Cath        Complications:  * No complications entered in OR log *         Disposition: PACU - hemodynamically stable.         Condition: stable

## 2012-03-06 NOTE — Progress Notes (Signed)
Her Port-A-Cath was removed uneventfully. The wound is partially closed and packed with some gauze due to the bacteremia. We will see her Thursday and take down the dressing and start to pull back the gauze wicks in the wound.

## 2012-03-06 NOTE — Preoperative (Signed)
Beta Blockers   Reason not to administer Beta Blockers:Not Applicable 

## 2012-03-06 NOTE — Progress Notes (Signed)
ANTICOAGULATION CONSULT NOTE - Follow Up Consult  Pharmacy Consult for coumadin Indication: VTE prophylaxis  No Known Allergies  Patient Measurements: Height: 5\' 6"  (167.6 cm) Weight: 195 lb (88.451 kg) IBW/kg (Calculated) : 59.3    Vital Signs: Temp: 97.4 F (36.3 C) (09/17 1242) Temp src: Oral (09/17 1242) BP: 105/47 mmHg (09/17 1230) Pulse Rate: 67  (09/17 1230)  Labs:  Basename 03/06/12 0408 03/05/12 0905 03/05/12 0021 03/05/12 0016 03/04/12 1622  HGB -- 9.5* 9.1* -- --  HCT -- 27.8* 26.4* -- 23.3*  PLT -- 103* 90* -- 81*  APTT -- 33 -- -- --  LABPROT 15.5* 14.6 14.3 -- --  INR 1.20 1.12 1.09 -- --  HEPARINUNFRC -- -- -- -- --  CREATININE 3.91* 4.25* -- 4.10* --  CKTOTAL 58 -- -- -- --  CKMB -- -- -- -- --  TROPONINI -- -- -- -- --    Estimated Creatinine Clearance: 18.7 ml/min (by C-G formula based on Cr of 3.91).   Medications:  Scheduled:    .  ceFAZolin (ANCEF) IV  1 g Intravenous Q24H  . docusate sodium  100 mg Oral BID  . multivitamin with minerals  1 tablet Oral Daily  . pantoprazole  80 mg Oral Q1200  . rifampin  300 mg Oral Daily  . DISCONTD: DAPTOmycin (CUBICIN)  IV  500 mg Intravenous Q24H  . DISCONTD: dexmedetomidine  0.4-1.2 mcg/kg/hr Intravenous To OR  . DISCONTD: DULoxetine  60 mg Oral Daily  . DISCONTD: Warfarin - Pharmacist Dosing Inpatient   Does not apply q1800    Assessment: 51yo female, s/p I&D of knee for septic arthritis, to re-start Coumadin for VTE prophylaxis for 4 weeks. Coumadin was on hold for possible HD needs but SCr is improving as well as UOP.  Patient noted on rifampin which will likely increase dosing requirements.   Goal of Therapy:  INR 2-3 Monitor platelets by anticoagulation protocol: Yes   Plan:  -Coumadin 10mg  po today -Daily PT/INR  Harland German, Pharm D 03/06/2012 1:00 PM

## 2012-03-06 NOTE — Transfer of Care (Signed)
Immediate Anesthesia Transfer of Care Note  Patient: Julie Ross  Procedure(s) Performed: Procedure(s) (LRB) with comments: REMOVAL PORT-A-CATH (N/A)  Patient Location: PACU  Anesthesia Type: MAC  Level of Consciousness: awake, alert  and oriented  Airway & Oxygen Therapy: Patient Spontanous Breathing and Patient connected to face mask oxygen  Post-op Assessment: Report given to PACU RN and Post -op Vital signs reviewed and stable  Post vital signs: Reviewed and stable  Complications: No apparent anesthesia complications

## 2012-03-06 NOTE — Anesthesia Procedure Notes (Signed)
Procedure Name: MAC Performed by: Margaree Mackintosh Pre-anesthesia Checklist: Patient identified, Emergency Drugs available, Suction available, Patient being monitored and Timeout performed Patient Re-evaluated:Patient Re-evaluated prior to inductionOxygen Delivery Method: Simple face mask Intubation Type: IV induction Placement Confirmation: positive ETCO2

## 2012-03-06 NOTE — Anesthesia Postprocedure Evaluation (Signed)
  Anesthesia Post-op Note  Patient: Julie Ross  Procedure(s) Performed: Procedure(s) (LRB) with comments: REMOVAL PORT-A-CATH (N/A)  Patient Location: PACU  Anesthesia Type: General  Level of Consciousness: awake, alert  and oriented  Airway and Oxygen Therapy: Patient Spontanous Breathing  Post-op Pain: none  Post-op Assessment: Post-op Vital signs reviewed and Patient's Cardiovascular Status Stable  Post-op Vital Signs: stable  Complications: No apparent anesthesia complications

## 2012-03-06 NOTE — Anesthesia Postprocedure Evaluation (Signed)
  Anesthesia Post-op Note  Patient: Julie Ross  Procedure(s) Performed: Procedure(s) (LRB) with comments: REMOVAL PORT-A-CATH (N/A)  Patient Location: PACU  Anesthesia Type: MAC  Level of Consciousness: awake, alert  and oriented  Airway and Oxygen Therapy: Patient Spontanous Breathing  Post-op Pain: none  Post-op Assessment: Post-op Vital signs reviewed, Patient's Cardiovascular Status Stable, Respiratory Function Stable, Patent Airway and No signs of Nausea or vomiting  Post-op Vital Signs: Reviewed and stable  Complications: No apparent anesthesia complications

## 2012-03-06 NOTE — Progress Notes (Signed)
Subjective: Day of Surgery Procedure(s) (LRB): REMOVAL PORT-A-CATH (N/A) Patient reports pain as 3 on 0-10 scale.   Complains of nausea this a.m. Out of bed yesterday. Having Port-A-Cath removed today by Dr. Abbey Chatters  Objective: Vital signs in last 24 hours: Temp:  [97.3 F (36.3 C)-98.5 F (36.9 C)] 97.8 F (36.6 C) (09/17 0815) Pulse Rate:  [69-82] 69  (09/17 0815) Resp:  [14-16] 16  (09/16 1923) BP: (125-134)/(49-61) 130/50 mmHg (09/17 0815) SpO2:  [90 %-98 %] 97 % (09/17 0815)  Intake/Output from previous day: 09/16 0701 - 09/17 0700 In: 3760 [P.O.:1560; I.V.:2200] Out: 1540 [Urine:1500; Drains:40] Intake/Output this shift:     Basename 03/05/12 0905 03/05/12 0021 03/04/12 1622 03/04/12 0440  HGB 9.5* 9.1* 7.9* 8.5*    Basename 03/05/12 0905 03/05/12 0021  WBC 5.0 5.9  RBC 3.40* 3.21*  HCT 27.8* 26.4*  PLT 103* 90*    Basename 03/06/12 0408 03/05/12 0905  NA 131* 129*  K 3.9 3.7  CL 96 91*  CO2 17* 21  BUN 41* 35*  CREATININE 3.91* 4.25*  GLUCOSE 93 110*  CALCIUM 8.9 9.0    Basename 03/06/12 0408 03/05/12 0905  LABPT -- --  INR 1.20 1.12  40 cc of Hemovac drain output yesterday from the left knee. Left knee exam: Hemovac drains x2 are intact.  Moderate pain through range of motion.  Does not have a  Tense effusion.  Calf is soft and nontender bilaterally.  SCDs are in place.    Assessment/Plan: Day of Surgery Procedure(s) (LRB): REMOVAL PORT-A-CATH (N/A) Status post left knee irrigation and debridement and replacement of polyethylene spacer on 03/02/12. Septic arthritis left knee status post TKA with septicemia.  Staph aureus. Plan: Port-A-Cath removal today. We appreciate general surgery, nephrology,  And infectious disease help. Continue IV antibiotics per Dr. Ninetta Lights.  Switched to Ancef/rifampin today. Consider discontinuing drain in one or two days.  Samaiya Awadallah G 03/06/2012, 9:53 AM

## 2012-03-06 NOTE — Progress Notes (Signed)
  Echocardiogram Echocardiogram Transesophageal has been performed.  Jorje Guild 03/06/2012, 3:58 PM

## 2012-03-06 NOTE — CV Procedure (Signed)
TEE See note in [employer] No SBE  Charlton Haws

## 2012-03-07 ENCOUNTER — Ambulatory Visit: Payer: BC Managed Care – PPO

## 2012-03-07 ENCOUNTER — Encounter (HOSPITAL_COMMUNITY): Payer: Self-pay | Admitting: Cardiovascular Disease

## 2012-03-07 ENCOUNTER — Encounter (HOSPITAL_COMMUNITY): Payer: Self-pay

## 2012-03-07 DIAGNOSIS — G35 Multiple sclerosis: Secondary | ICD-10-CM

## 2012-03-07 DIAGNOSIS — C50419 Malignant neoplasm of upper-outer quadrant of unspecified female breast: Secondary | ICD-10-CM

## 2012-03-07 LAB — RENAL FUNCTION PANEL
Albumin: 2.1 g/dL — ABNORMAL LOW (ref 3.5–5.2)
BUN: 38 mg/dL — ABNORMAL HIGH (ref 6–23)
CO2: 20 mEq/L (ref 19–32)
Calcium: 8.8 mg/dL (ref 8.4–10.5)
Chloride: 105 mEq/L (ref 96–112)
Creatinine, Ser: 3.29 mg/dL — ABNORMAL HIGH (ref 0.50–1.10)
GFR calc Af Amer: 17 mL/min — ABNORMAL LOW (ref >90)
GFR calc non Af Amer: 15 mL/min — ABNORMAL LOW (ref >90)
Glucose, Bld: 96 mg/dL (ref 70–99)
Phosphorus: 6.1 mg/dL — ABNORMAL HIGH (ref 2.3–4.6)
Potassium: 4.1 mEq/L (ref 3.5–5.1)
Sodium: 137 mEq/L (ref 135–145)

## 2012-03-07 LAB — ANAEROBIC CULTURE

## 2012-03-07 LAB — GLUCOSE, CAPILLARY: Glucose-Capillary: 128 mg/dL — ABNORMAL HIGH (ref 70–99)

## 2012-03-07 LAB — PROTIME-INR
INR: 1.4 (ref 0.00–1.49)
Prothrombin Time: 16.8 seconds — ABNORMAL HIGH (ref 11.6–15.2)

## 2012-03-07 MED ORDER — SODIUM CHLORIDE 0.9 % IV SOLN
INTRAVENOUS | Status: DC
  Administered 2012-03-07: 22:00:00 via INTRAVENOUS
  Administered 2012-03-07: 50 mL/h via INTRAVENOUS

## 2012-03-07 MED ORDER — CEFAZOLIN SODIUM 1-5 GM-% IV SOLN
1.0000 g | Freq: Two times a day (BID) | INTRAVENOUS | Status: DC
Start: 2012-03-07 — End: 2012-03-08
  Administered 2012-03-07 – 2012-03-08 (×3): 1 g via INTRAVENOUS
  Filled 2012-03-07 (×4): qty 50

## 2012-03-07 MED ORDER — WARFARIN SODIUM 10 MG PO TABS
10.0000 mg | ORAL_TABLET | Freq: Once | ORAL | Status: AC
  Administered 2012-03-07: 10 mg via ORAL
  Filled 2012-03-07: qty 1

## 2012-03-07 NOTE — Progress Notes (Signed)
Physical Therapy Treatment Patient Details Name: Julie Ross MRN: [id number] DOB: 1960/09/13 Today's Date: 03/07/2012 Time: 1010-1046 PT Time Calculation (min): 36 min  PT Assessment / Plan / Recommendation Comments on Treatment Session  Patient s/p infected left knee prosthesis with I&D.  Patient able to ambulate today.  Improving each day.    Follow Up Recommendations  Inpatient Rehab    Barriers to Discharge        Equipment Recommendations  Rolling walker with 5" wheels    Recommendations for Other Services Rehab consult  Frequency Min 5X/week   Plan Discharge plan remains appropriate;Frequency remains appropriate    Precautions / Restrictions Precautions Precautions: Fall Required Braces or Orthoses: Knee Immobilizer - Left Knee Immobilizer - Left: On at all times Restrictions Weight Bearing Restrictions: Yes LLE Weight Bearing: Weight bearing as tolerated   Pertinent Vitals/Pain VSS, Some pain    Mobility  Bed Mobility Bed Mobility: Supine to Sit;Sitting - Scoot to Edge of Bed Rolling Right: Not tested (comment) Rolling Left: Not tested (comment) Left Sidelying to Sit: Not tested (comment) Supine to Sit: 4: Min assist;With rails;HOB elevated Sitting - Scoot to Edge of Bed: 4: Min assist Sit to Supine: Not Tested (comment) Details for Bed Mobility Assistance: less cues for technique.  Not as painful today for patient.   Transfers Transfers: Sit to Stand;Stand to Sit Sit to Stand: 1: +2 Total assist;From elevated surface;With upper extremity assist;From bed Sit to Stand: Patient Percentage: 70% Stand to Sit: 1: +2 Total assist;With upper extremity assist;With armrests;To chair/3-in-1 Stand to Sit: Patient Percentage: 70% Stand Pivot Transfers: Not tested (comment) Lateral/Scoot Transfers: Not tested (comment) Details for Transfer Assistance: cues needed for hand placement for sit to stand and cues for sequencing steps and RW during transfer. Cues for left LE  placement with sit to stand as well.  Ambulation/Gait Ambulation/Gait Assistance: 4: Min assist Ambulation Distance (Feet): 7 Feet Assistive device: Rolling walker Ambulation/Gait Assistance Details: Patient needed cues for sequencing steps and RW.   Gait Pattern: Step-to pattern;Decreased stride length Gait velocity: decreased Stairs: No Wheelchair Mobility Wheelchair Mobility: No    Exercises General Exercises - Lower Extremity Ankle Circles/Pumps: AROM;10 reps;Both;Supine    PT Goals Acute Rehab PT Goals PT Goal: Supine/Side to Sit - Progress: Progressing toward goal PT Goal: Sit to Stand - Progress: Progressing toward goal PT Goal: Stand to Sit - Progress: Progressing toward goal PT Goal: Stand - Progress: Progressing toward goal PT Goal: Ambulate - Progress: Progressing toward goal  Visit Information  Last PT Received On: 03/07/12 Assistance Needed: +2    Subjective Data  Subjective: "I can try to walk."   Cognition  Overall Cognitive Status: Appears within functional limits for tasks assessed/performed Arousal/Alertness: Awake/alert Orientation Level: Appears intact for tasks assessed Behavior During Session: [facility] for tasks performed    Balance  Static Sitting Balance Static Sitting - Balance Support: Bilateral upper extremity supported;Feet supported Static Sitting - Level of Assistance: 5: Stand by assistance Static Sitting - Comment/# of Minutes: 1 minute Static Standing Balance Static Standing - Balance Support: Bilateral upper extremity supported;During functional activity Static Standing - Level of Assistance: 5: Stand by assistance Static Standing - Comment/# of Minutes: 1 minute with RW  End of Session PT - End of Session Equipment Utilized During Treatment: Gait belt;Left knee immobilizer Activity Tolerance: Patient limited by fatigue;Patient limited by pain Patient left: in chair;with call bell/phone within reach;with family/visitor present Nurse  Communication: Mobility status  INGOLD,Shota Kohrs 03/07/2012, 1:46 PM  [facility] [phone] (pager)

## 2012-03-07 NOTE — Progress Notes (Signed)
I met with pt, spouse and daughter at bedside. Patient will benefit from an inpt rehab stay prior to d/c home. I will begin insurance approval once OT eval is obtained which I have called to arrange for in the a.m. Please clarify when pt medically ready to admit to CIR, ? Thursday. I will contact Dr. Sharon Seller to discuss. We can follow cultures and obtain PICC on rehab also if medically ready once insurance approves. [phone].

## 2012-03-07 NOTE — Consult Note (Signed)
Physical Medicine and Rehabilitation Consult Reason for Consult: Staph aureus bacteremia with deconditioning.  Referring Physician:  Dr. Sharon Seller   HPI: Julie Ross is a 51 y.o. female HTN, DJD and breast Ca who underwent L TKR in Feb 2013. She was diagnosed with breast Ca in June and started chemotherapy w docetaxel/carboplatin and getting herceptin weekly. She had L mastectomy in [place] bx in July and has been receiving regular chemoRx since. She presented to orthopedic office with painful swollen L knee on 9/13. Aspirate showed WBC's and + gram stain for staph aureus. BC positive for staph aureus. She was admitted and underwent I&D w poly exchange the same day. Was started on vancomycin for treatment.  On 03/04/12, developed acute renal failure with hypoxia and hypotension and poor UOP. Was started on IVF and ativan discontinued per renal input. Changed to IV daptomycin. ID following for input and reccommended TEE to rule out endocarditis aand addition of rifampin. Port-A-cath removed on 09/16. TEE without evidence of SBE.  Acute renal failuren resolving  and UOP improving. Therapies initiated and therapy team recommending CIR for progression.   Patient states that she was independent with all her bathing and dressing prior to her admission to the hospital last Wednesday. She is motivated to improve her walking Husband is at bedside and feels that he can help her at home as well Review of Systems  Eyes: Negative for double vision.  Respiratory: Negative for shortness of breath and wheezing.   Cardiovascular: Negative for chest pain and palpitations.  Gastrointestinal: Positive for constipation.  Musculoskeletal: Positive for myalgias and joint pain (left knee).  Neurological: Negative for headaches.   Past Medical History  Diagnosis Date  . Hypertension   . Hypothyroidism   . Arthritis   . Neuromuscular disorder     MS TX WITH CYMBALTA   . Multiple sclerosis   . H/O colonoscopy   . H/O  bone density study 03/2011  . Wears glasses   . Heart burn   . Depression   . Sleep apnea     MILD SLEEP APNEA, NO MACHINE  6 YRS AGO HPT [facility]   . Breast cancer     lt breast   Past Surgical History  Procedure Date  . Tonsillectomy   . Tumor removal     FROM PELVIS AGE 36  . Cesarean section     X2   . No past surgeries     BLADDER SLING  4-5 YRS AGO   . Knee arthroscopy     LT KNEE 04/2011  . Total knee arthroplasty 08/15/2011    Procedure: TOTAL KNEE ARTHROPLASTY; lft Surgeon: Harvie Junior, MD;  Location: [facility] OR;  Service: Orthopedics;  Laterality: Left;  RIGHT KNEE CORTIZONE INJECTION  . Mohs surgery   . Knee arthroscopy 82    rt  . Joint replacement Feb 2013  . Mastectomy w/ sentinel node biopsy 12/19/2011    Procedure: MASTECTOMY WITH SENTINEL LYMPH NODE BIOPSY;  Surgeon: Currie Paris, MD;  Location: [facility] OR;  Service: General;  Laterality: Left;  left breast and left axilla   . Portacath placement 01/16/2012    Procedure: INSERTION PORT-A-CATH;  Surgeon: Currie Paris, MD;  Location: [facility];  Service: General;  Laterality: Right;  Porta Cath Placement   . I&d knee with poly exchange 03/02/2012    Procedure: IRRIGATION AND DEBRIDEMENT KNEE WITH POLY EXCHANGE;  Surgeon: Harvie Junior, MD;  Location: [facility] OR;  Service: Orthopedics;  Laterality: Left;  I&D of total knee with Possible Poly Exchange   . Tee without cardioversion 03/06/2012    Procedure: TRANSESOPHAGEAL ECHOCARDIOGRAM (TEE);  Surgeon: Wendall Stade, MD;  Location: [facility];  Service: Cardiovascular;  Laterality: N/A;  Rm. [id number]   Family History  Problem Relation Age of Onset  . Breast cancer Paternal Aunt   . Breast cancer Maternal Grandmother   . Heart disease Maternal Grandfather    Social History:  Married. Works as an Oncologist. She reports that she has never smoked. She has never used smokeless tobacco. She reports that she does not drink alcohol or use illicit  drugs. Husband supportive and plans on taking a week off to assist past discharge.   Allergies: No Known Allergies  Medications Prior to Admission  Medication Sig Dispense Refill  . baclofen (LIORESAL) 10 MG tablet Take 10 mg by mouth 3 (three) times daily.      Marland Kitchen CALCIUM-MAGNESIUM PO Take 1 tablet by mouth daily.      . clonazePAM (KLONOPIN) 0.5 MG tablet Take 0.5 mg by mouth at bedtime as needed. For sleep      . dexamethasone (DECADRON) 4 MG tablet Take 2 tablets two times a day the day before Taxotere. Then take 2 tabs two times a day starting the day after chemo for 3 days.  30 tablet  1  . DULoxetine (CYMBALTA) 60 MG capsule Take 60 mg by mouth daily.      Marland Kitchen esomeprazole (NEXIUM) 40 MG capsule Take 40 mg by mouth daily before breakfast.      . gabapentin (NEURONTIN) 600 MG tablet Take 600 mg by mouth 3 (three) times daily.      Marland Kitchen HYDROcodone-acetaminophen (NORCO) 10-325 MG per tablet Take 1 tablet by mouth every 8 (eight) hours as needed. As needed for pain.      Marland Kitchen lidocaine-prilocaine (EMLA) cream Apply 1 application topically as needed.      Marland Kitchen LORazepam (ATIVAN) 0.5 MG tablet Take 0.5 mg by mouth every 8 (eight) hours. As needed for anxiety.      Marland Kitchen losartan-hydrochlorothiazide (HYZAAR) 50-12.5 MG per tablet Take 1 tablet by mouth daily.      . Multiple Vitamins-Minerals (MULTIVITAMINS THER. W/MINERALS) TABS Take 1 tablet by mouth daily.      . ondansetron (ZOFRAN) 8 MG tablet Take 1 tablet two times a day starting the day after chemo for 3 days. Then take 1 tab two times a day as needed for nausea or vomiting.  30 tablet  1  . prochlorperazine (COMPAZINE) 10 MG tablet Take 1 tablet (10 mg total) by mouth every 6 (six) hours as needed (Nausea or vomiting).  30 tablet  1  . prochlorperazine (COMPAZINE) 25 MG suppository Place 1 suppository (25 mg total) rectally every 12 (twelve) hours as needed for nausea.  12 suppository  3  . naproxen sodium (ANAPROX) 550 MG tablet Take 550 mg by mouth  as needed.      Marland Kitchen PRESCRIPTION MEDICATION Chemotherapy regimen. Taxotere.      Marland Kitchen UNABLE TO FIND Cranial Prothesis  1 each  0    Home: Home Living Lives With: Spouse Available Help at Discharge: Family;Available 24 hours/day (will be able to take a week off when pt dc's) Type of Home: House Home Access: Stairs to enter Entrance Stairs-Number of Steps: 1 Home Layout: Two level;Bed/bath upstairs;1/2 bath on main level (has slept on couch of main level before) Alternate Level Stairs-Number of Steps: 12 Alternate  Level Stairs-Rails:  (to be determined) Bathroom Shower/Tub:  (upstairs) Bathroom Accessibility: Yes How Accessible: Accessible via walker Home Adaptive Equipment: Walker - rolling;Bedside commode/3-in-1  Functional History: Prior Function Able to Take Stairs?: Yes Driving: Yes Functional Status:  Mobility: Bed Mobility Bed Mobility: Supine to Sit;Sitting - Scoot to [place] of Bed Rolling Right: Not tested (comment) Rolling Right: Patient Percentage: 30% Rolling Left: Not tested (comment) Rolling Left: Patient Percentage: 30% Left Sidelying to Sit: Not tested (comment) Supine to Sit: 1: +2 Total assist Supine to Sit: Patient Percentage: 30% Sitting - Scoot to Edge of Bed: 2: Max assist (with pad) Sit to Supine: Not Tested (comment) Sit to Supine: Patient Percentage: 30% Transfers Transfers: Sit to Stand;Stand to Sit;Stand Pivot Transfers Sit to Stand: 1: +2 Total assist;With upper extremity assist;From bed Sit to Stand: Patient Percentage: 50% Stand to Sit: 1: +2 Total assist;With upper extremity assist;With armrests;To chair/3-in-1 Stand to Sit: Patient Percentage: 60% Stand Pivot Transfers: 1: +2 Total assist Stand Pivot Transfers: Patient Percentage: 70% Lateral/Scoot Transfers: Not tested (comment) Lateral Transfers: Patient Percentage: 30% Ambulation/Gait Ambulation/Gait Assistance: Not tested (comment) Stairs: No Wheelchair Mobility Wheelchair Mobility:  No  ADL:    Cognition: Cognition Arousal/Alertness: Awake/alert Orientation Level: Oriented X4 Cognition Overall Cognitive Status: Appears within functional limits for tasks assessed/performed Difficult to assess due to: Level of arousal Arousal/Alertness: Awake/alert Orientation Level: Appears intact for tasks assessed Behavior During Session: [facility] for tasks performed  Blood pressure 136/58, pulse 71, temperature 97.7 F (36.5 C), temperature source Oral, resp. rate 16, height 5\' 6"  (1.676 m), weight 88.451 kg (195 lb), last menstrual period 12/05/2011, SpO2 96.00%. Physical Exam  Nursing note and vitals reviewed. Constitutional: She is oriented to person, place, and time. She appears well-developed and well-nourished.  HENT:  Head: Normocephalic and atraumatic.  Eyes: Pupils are equal, round, and reactive to light.  Neck: Normal range of motion.  Cardiovascular: Normal rate and regular rhythm.   Pulmonary/Chest: Effort normal and breath sounds normal.  Abdominal: Soft. Bowel sounds are normal.  Musculoskeletal:       LLE ace wrapped and hemovac in place. Discomfort with attempts at minimal ROM. Moves BUE/RLE without difficulty.  Neurological: She is alert and oriented to person, place, and time.  Skin: Skin is warm and dry.       Dry dressing on right upper chest wall.   Motor strength is 5/5 in bilateral deltoid, biceps, triceps, grip 5 minus/5 in the right hip flexor knee extensor ankle dorsiflexor 2 minus/5 in the left hip flexor 1/5 in the left knee extensor knee flexor, 4/5 in the left ankle dorsiflexor and plantar flexor Sensation is intact to light touch in both upper and lower limbs   Results for orders placed during the hospital encounter of 03/02/12 (from the past 24 hour(s))  GLUCOSE, CAPILLARY     Status: Abnormal   Collection Time   03/06/12 12:45 PM      Component Value Range   Glucose-Capillary 121 (*) 70 - 99 mg/dL  GLUCOSE, CAPILLARY     Status: Normal    Collection Time   03/06/12  5:14 PM      Component Value Range   Glucose-Capillary 87  70 - 99 mg/dL  GLUCOSE, CAPILLARY     Status: Abnormal   Collection Time   03/06/12  9:38 PM      Component Value Range   Glucose-Capillary 106 (*) 70 - 99 mg/dL  PROTIME-INR     Status: Abnormal   Collection  Time   03/07/12  6:00 AM      Component Value Range   Prothrombin Time 16.8 (*) 11.6 - 15.2 seconds   INR 1.40  0.00 - 1.49  RENAL FUNCTION PANEL     Status: Abnormal   Collection Time   03/07/12  6:00 AM      Component Value Range   Sodium 137  135 - 145 mEq/L   Potassium 4.1  3.5 - 5.1 mEq/L   Chloride 105  96 - 112 mEq/L   CO2 20  19 - 32 mEq/L   Glucose, Bld 96  70 - 99 mg/dL   BUN 38 (*) 6 - 23 mg/dL   Creatinine, Ser 4.09 (*) 0.50 - 1.10 mg/dL   Calcium 8.8  8.4 - 81.1 mg/dL   Phosphorus 6.1 (*) 2.3 - 4.6 mg/dL   Albumin 2.1 (*) 3.5 - 5.2 g/dL   GFR calc non Af Amer 15 (*) >90 mL/min   GFR calc Af Amer 17 (*) >90 mL/min   No results found.  Assessment/Plan: Diagnosis: Deconditioning do to septic left knee with acute renal failure. 1. Does the need for close, 24 hr/day medical supervision in concert with the patient's rehab needs make it unreasonable for this patient to be served in a less intensive setting? Yes 2. Co-Morbidities requiring supervision/potential complications: Left knee contracture, breast carcinoma with recent chemotherapy 3. Due to bladder management, bowel management, safety, skin/wound care, disease management, medication administration, pain management and patient education, does the patient require 24 hr/day rehab nursing? Yes 4. Does the patient require coordinated care of a physician, rehab nurse, PT (1-2 hrs/day, 5 days/week) and OT (1-2 hrs/day, 5 days/week) to address physical and functional deficits in the context of the above medical diagnosis(es)? Yes Addressing deficits in the following areas: balance, endurance, locomotion, strength, transferring,  bowel/bladder control, bathing, dressing, feeding and toileting 5. Can the patient actively participate in an intensive therapy program of at least 3 hrs of therapy per day at least 5 days per week? Yes 6. The potential for patient to make measurable gains while on inpatient rehab is good 7. Anticipated functional outcomes upon discharge from inpatient rehab are Supervision to modified independent mobility with PT, Provision modified independent ADLs with OT, Not applicable with SLP. 8. Estimated rehab length of stay to reach the above functional goals is: 7-10 days 9. Does the patient have adequate social supports to accommodate these discharge functional goals? Yes 10. Anticipated D/C setting: Home 11. Anticipated post D/C treatments: HH therapy 12. Overall Rehab/Functional Prognosis: good  RECOMMENDATIONS: This patient's condition is appropriate for continued rehabilitative care in the following setting: CIR Patient has agreed to participate in recommended program. Yes Note that insurance prior authorization may be required for reimbursement for recommended care.  Comment:Will need left knee drain out before participating in a more aggressive  Rehabilitation program    03/07/2012

## 2012-03-07 NOTE — Progress Notes (Signed)
Right chest wall dressing dry.  Will change bandage tomorrow.

## 2012-03-07 NOTE — Progress Notes (Signed)
Patient will benefit from an inpt rehab admission prior to d/c. I need an OT evaluation to pursue insurance approval. I will discuss with patient and family today. [phone]

## 2012-03-07 NOTE — Progress Notes (Signed)
Subjective:  No acute events overnight. Patient doing well this morning. Had port-a-cath removed yesterday with no problems. UOP continues to increase.  Objective Vital signs in last 24 hours: Filed Vitals:   03/06/12 1800 03/06/12 2000 03/06/12 2335 03/07/12 0340  BP:  120/47 123/57 122/59  Pulse: 66 75 72 71  Temp:  97.8 F (36.6 C) 98.3 F (36.8 C) 98 F (36.7 C)  TempSrc:  Oral Axillary Oral  Resp: 8 16 16 16   Height:      Weight:      SpO2: 98% 97% 98% 98%   Weight change:   Intake/Output Summary (Last 24 hours) at 03/07/12 0743 Last data filed at 03/07/12 0630  Gross per 24 hour  Intake 2749.67 ml  Output   2585 ml  Net 164.67 ml   Labs: Basic Metabolic Panel:  Lab 03/07/12 1610 03/06/12 0408 03/05/12 0905 03/05/12 0016  NA 137 131* 129* --  K 4.1 3.9 3.7 --  CL 105 96 91* --  CO2 20 17* 21 --  GLUCOSE 96 93 110* --  BUN 38* 41* 35* --  CREATININE 3.29* 3.91* 4.25* --  CALCIUM 8.8 8.9 9.0 --  ALB -- -- -- --  PHOS 6.1* 6.6* -- 7.7*   Liver Function Tests:  Lab 03/07/12 0600 03/06/12 0408 03/05/12 0905 03/04/12 0908 03/02/12 1402  AST -- -- 32 29 45*  ALT -- -- 34 34 57*  ALKPHOS -- -- 549* 419* 317*  BILITOT -- -- 0.6 1.0 2.0*  PROT -- -- 6.9 6.3 6.9  ALBUMIN 2.1* 2.4* 2.6* -- --   No results found for this basename: LIPASE:3,AMYLASE:3 in the last 168 hours No results found for this basename: AMMONIA:3 in the last 168 hours CBC:  Lab 03/05/12 0905 03/05/12 0021 03/04/12 1622 03/04/12 0440 03/03/12 0545 03/02/12 1403  WBC 5.0 5.9 6.8 -- -- --  NEUTROABS 3.5 -- -- -- -- 6.6  HGB 9.5* 9.1* 7.9* -- -- --  HCT 27.8* 26.4* 23.3* -- -- --  MCV 81.8 82.2 80.3 81.0 79.7 --  PLT 103* 90* 81* -- -- --   Cardiac Enzymes:  Lab 03/06/12 0408  CKTOTAL 58  CKMB --  CKMBINDEX --  TROPONINI --   CBG:  Lab 03/06/12 2138 03/06/12 1714 03/06/12 1245 03/06/12 0819  GLUCAP 106* 87 121* 90    Iron Studies: No results found for this basename:  IRON,TIBC,TRANSFERRIN,FERRITIN in the last 72 hours Studies/Results: No results found. Medications: Infusions:    . sodium chloride 100 mL (03/06/12 2337)  . DISCONTD: sodium chloride    . DISCONTD: lactated ringers 50 mL/hr at 03/06/12 0958   Scheduled Medications:    .  ceFAZolin (ANCEF) IV  1 g Intravenous Q24H  . docusate sodium  100 mg Oral BID  . enoxaparin (LOVENOX) injection  30 mg Subcutaneous Q24H  . multivitamin with minerals  1 tablet Oral Daily  . pantoprazole  80 mg Oral Q1200  . rifampin  300 mg Oral Daily  . warfarin  10 mg Oral ONCE-1800  . Warfarin - Pharmacist Dosing Inpatient   Does not apply q1800  . DISCONTD: DAPTOmycin (CUBICIN)  IV  500 mg Intravenous Q24H  . DISCONTD: dexmedetomidine  0.4-1.2 mcg/kg/hr Intravenous To OR  . DISCONTD: DULoxetine  60 mg Oral Daily   I have reviewed scheduled and prn medications.  Physical Exam: General: Awake, alert. Comfortable in bed Heart: RRR Abdomen: soft, positive bowel sounds  Extremities: Left leg wrapped with ACE. SCD in  place on left leg. Trace edema. Foley leg strap on right leg. GU: Foley in place with yellow urine in bag  Assessment/ Plan: Pt is a 51 y.o. yo female who was admitted on 03/02/2012 with infected prosthetic knee ARF due to vancomycin use plus ARB/volume depletion  1. ARF- Baseline renal function is normal. Today, Cr is continuing to improve to 3.29 (down from 3.91yesterday) U/A showed granular casts - was on ARB and had supratherapuetic vanc level as well as low BPs.  - Urine output is excellent with 2.5L out in last 24 hours - Continue supportive care- all meds that can cause low BP have been D/Cd.   - No indications for HD and it appears she is improving at this time. Renal will sign off but will continue to follow electronically as I anticipate kidney function will continue to improve, hopefully to normal baseline  2. HTN/vol- BP improving. Holding all BP and BP lowering meds  3. Anemia-  Hgb 9.5 at last check. Would recommend rechecking CBC but no ESA now  4. ID- Staph aureus bacteremia- abx per ID- ancef, rifampin and s/p portacath removal.  Echo negative for vegetation  5. Dispo- Patient has improved with no acute indications for dialysis.   HAIRFORD, AMBER  03/07/2012,7:43 AM  LOS: 5 days   Patient seen and examined, agree with above note with above modifications. ARF in setting of above likely due to ATN, over last 48 hours, UOP up and all lab parameters improving.  Renal will sign off but continue to follow creatinine trend.  Patient not yet ready to have foley removed but would remove when appropriate.  Annie Sable, MD 03/07/2012

## 2012-03-07 NOTE — Progress Notes (Signed)
Subjective: 1 Day Post-Op Procedure(s) (LRB): TRANSESOPHAGEAL ECHOCARDIOGRAM (TEE) (N/A) 5 days s/p i&d of l. Knee joint Patient reports pain as moderate.    Objective: Vital signs in last 24 hours: Temp:  [97.7 F (36.5 C)-98.3 F (36.8 C)] 97.8 F (36.6 C) (09/18 1144) Pulse Rate:  [58-75] 68  (09/18 1144) Resp:  [8-56] 16  (09/18 0340) BP: (114-136)/(47-63) 135/63 mmHg (09/18 1144) SpO2:  [95 %-100 %] 99 % (09/18 1144)  Intake/Output from previous day: 09/17 0701 - 09/18 0700 In: 2749.7 [P.O.:240; I.V.:2459.7; IV Piggyback:50] Out: 2585 [Urine:2525; Drains:60] Intake/Output this shift:     Basename 03/05/12 0905 03/05/12 0021 03/04/12 1622  HGB 9.5* 9.1* 7.9*    Basename 03/05/12 0905 03/05/12 0021  WBC 5.0 5.9  RBC 3.40* 3.21*  HCT 27.8* 26.4*  PLT 103* 90*    Basename 03/07/12 0600 03/06/12 0408  NA 137 131*  K 4.1 3.9  CL 105 96  CO2 20 17*  BUN 38* 41*  CREATININE 3.29* 3.91*  GLUCOSE 96 93  CALCIUM 8.8 8.9    Basename 03/07/12 0600 03/06/12 0408  LABPT -- --  INR 1.40 1.20    Neurologically intact ABD soft Neurovascular intact Sensation intact distally Intact pulses distally No cellulitis present Compartment soft  Assessment/Plan: 1 Day Post-Op Procedure(s) (LRB): TRANSESOPHAGEAL ECHOCARDIOGRAM (TEE) (N/A) 5 days s/p l rev tkr for infection medical issues improving Advance diet Up with therapy APPRECIATE help of int med, cv,neph,and gen surg At request of medical service we will transfer to medical service but will continue to follow as consultant  Jinger Middlesworth L 03/07/2012, 1:28 PM

## 2012-03-07 NOTE — Progress Notes (Signed)
INFECTIOUS DISEASE PROGRESS NOTE  ID: Julie Ross is a 51 y.o. female with   Principal Problem:  *Infected prosthetic knee joint Active Problems:  Cancer of upper-outer quadrant of female breast  Dehydration  Hypokalemia  Hyponatremia  Bacteremia  Subjective: C/o nausea, constipation  Abtx:  Anti-infectives     Start     Dose/Rate Route Frequency Ordered Stop   03/07/12 1100   ceFAZolin (ANCEF) IVPB 1 g/50 mL premix        1 g 100 mL/hr over 30 Minutes Intravenous Every 12 hours 03/07/12 0812     03/06/12 2300   DAPTOmycin (CUBICIN) 500 mg in sodium chloride 0.9 % IVPB  Status:  Discontinued        500 mg 220 mL/hr over 30 Minutes Intravenous Every 24 hours 03/05/12 0856 03/06/12 0931   03/06/12 1200   DAPTOmycin (CUBICIN) 500 mg in sodium chloride 0.9 % IVPB  Status:  Discontinued        500 mg 220 mL/hr over 30 Minutes Intravenous Every 24 hours 03/05/12 0850 03/05/12 0856   03/06/12 1100   ceFAZolin (ANCEF) IVPB 1 g/50 mL premix  Status:  Discontinued        1 g 100 mL/hr over 30 Minutes Intravenous Every 24 hours 03/06/12 0931 03/07/12 0812   03/05/12 1000   rifampin (RIFADIN) capsule 300 mg        300 mg Oral Daily 03/05/12 0915     03/03/12 0400   vancomycin (VANCOCIN) IVPB 1000 mg/200 mL premix  Status:  Discontinued        1,000 mg 200 mL/hr over 60 Minutes Intravenous Every 12 hours 03/02/12 1946 03/04/12 1134   03/02/12 1656   polymyxin B 500,000 Units, bacitracin 50,000 Units in sodium chloride irrigation 0.9 % 500 mL irrigation  Status:  Discontinued          As needed 03/02/12 1656 03/02/12 1741          Medications:  Scheduled:   .  ceFAZolin (ANCEF) IV  1 g Intravenous Q12H  . docusate sodium  100 mg Oral BID  . enoxaparin (LOVENOX) injection  30 mg Subcutaneous Q24H  . multivitamin with minerals  1 tablet Oral Daily  . pantoprazole  80 mg Oral Q1200  . rifampin  300 mg Oral Daily  . warfarin  10 mg Oral ONCE-1800  . warfarin  10 mg Oral  ONCE-1800  . Warfarin - Pharmacist Dosing Inpatient   Does not apply q1800  . DISCONTD:  ceFAZolin (ANCEF) IV  1 g Intravenous Q24H  . DISCONTD: dexmedetomidine  0.4-1.2 mcg/kg/hr Intravenous To OR    Objective: Vital signs in last 24 hours: Temp:  [97.4 F (36.3 C)-98.3 F (36.8 C)] 97.8 F (36.6 C) (09/18 1144) Pulse Rate:  [58-75] 68  (09/18 1144) Resp:  [8-56] 16  (09/18 0340) BP: (105-136)/(47-63) 135/63 mmHg (09/18 1144) SpO2:  [95 %-100 %] 99 % (09/18 1144)   General appearance: alert, cooperative, no distress and pale Resp: clear to auscultation bilaterally Chest wall: dressing R Upper chest Cardio: regular rate and rhythm GI: normal findings: bowel sounds normal and soft, non-tender Extremities: L knee dressed.   Lab Results  Basename 03/07/12 0600 03/06/12 0408 03/05/12 0905 03/05/12 0021  WBC -- -- 5.0 5.9  HGB -- -- 9.5* 9.1*  HCT -- -- 27.8* 26.4*  NA 137 131* -- --  K 4.1 3.9 -- --  CL 105 96 -- --  CO2 20 17* -- --  BUN 38* 41* -- --  CREATININE 3.29* 3.91* -- --  GLU -- -- -- --   Liver Panel  Basename 03/07/12 0600 03/06/12 0408 03/05/12 0905  PROT -- -- 6.9  ALBUMIN 2.1* 2.4* --  AST -- -- 32  ALT -- -- 34  ALKPHOS -- -- 549*  BILITOT -- -- 0.6  BILIDIR -- -- --  IBILI -- -- --   Sedimentation Rate No results found for this basename: ESRSEDRATE in the last 72 hours C-Reactive Protein No results found for this basename: CRP:2 in the last 72 hours  Microbiology: Recent Results (from the past 240 hour(s))  SURGICAL PCR SCREEN     Status: Abnormal   Collection Time   03/02/12  1:35 PM      Component Value Range Status Comment   MRSA, PCR NEGATIVE  NEGATIVE Final    Staphylococcus aureus POSITIVE (*) NEGATIVE Final   ANAEROBIC CULTURE     Status: Normal   Collection Time   03/02/12  4:20 PM      Component Value Range Status Comment   Specimen Description SYNOVIAL FLUID KNEE LEFT   Final    Special Requests RECEIVED ON SWAB   Final     Gram Stain     Final    Value: MODERATE WBC PRESENT, PREDOMINANTLY PMN     RARE GRAM POSITIVE COCCI     IN PAIRS Gram Stain Report Called to,Read Back By and Verified With: Gram Stain Report Called to,Read Back By and Verified With: DR GRAVES AT 1652 03/02/12 BY ROMEROJ Performed at [facility]   Culture NO ANAEROBES ISOLATED   Final    Report Status 03/07/2012 FINAL   Final   BODY FLUID CULTURE     Status: Normal   Collection Time   03/02/12  4:20 PM      Component Value Range Status Comment   Specimen Description SYNOVIAL FLUID KNEE LEFT   Final    Special Requests RECEIVED ON SWAB   Final    Gram Stain     Final    Value: MODERATE WBC PRESENT, PREDOMINANTLY PMN     RARE GRAM POSITIVE COCCI     IN PAIRS Gram Stain Report Called to,Read Back By and Verified With: Gram Stain Report Called to,Read Back By and Verified With: DR GRAVES AT 1652 03/02/12 BY ROMEROJ Performed at [facility]   Culture     Final    Value: MODERATE STAPHYLOCOCCUS AUREUS     Note: RIFAMPIN AND GENTAMICIN SHOULD NOT BE USED AS SINGLE DRUGS FOR TREATMENT OF STAPH INFECTIONS.   Report Status 03/04/2012 FINAL   Final    Organism ID, Bacteria STAPHYLOCOCCUS AUREUS   Final   GRAM STAIN     Status: Normal   Collection Time   03/02/12  4:20 PM      Component Value Range Status Comment   Specimen Description SYNOVIAL FLUID KNEE LEFT   Final    Special Requests NONE   Final    Gram Stain     Final    Value: MODERATE WBC PRESENT, PREDOMINANTLY PMN     RARE GRAM POSITIVE COCCI IN PAIRS     CALLED TO DR. Luiz Blare 9.13.13 AT 1652 BY ROMEROJ   Report Status 03/02/2012 FINAL   Final   CULTURE, BLOOD (ROUTINE X 2)     Status: Normal   Collection Time   03/03/12 12:25 AM      Component Value Range Status Comment  Specimen Description BLOOD RIGHT HAND   Final    Special Requests BOTTLES DRAWN AEROBIC AND ANAEROBIC 5CC EACH   Final    Culture  Setup Time 03/03/2012 11:01   Final    Culture     Final    Value:  STAPHYLOCOCCUS AUREUS     Note: RIFAMPIN AND GENTAMICIN SHOULD NOT BE USED AS SINGLE DRUGS FOR TREATMENT OF STAPH INFECTIONS. This organism DOES NOT demonstrate inducible Clindamycin resistance in vitro.     Note: Gram Stain Report Called to,Read Back By and Verified With: ERIN ARNOLD 03/04/12 @ 11:30AM BY RUSCA.   Report Status 03/06/2012 FINAL   Final    Organism ID, Bacteria STAPHYLOCOCCUS AUREUS   Final   CULTURE, BLOOD (ROUTINE X 2)     Status: Normal   Collection Time   03/03/12 12:40 AM      Component Value Range Status Comment   Specimen Description BLOOD RIGHT HAND   Final    Special Requests BOTTLES DRAWN AEROBIC AND ANAEROBIC 3CC EACH   Final    Culture  Setup Time 03/03/2012 11:01   Final    Culture     Final    Value: STAPHYLOCOCCUS AUREUS     Note: SUSCEPTIBILITIES PERFORMED ON PREVIOUS CULTURE WITHIN THE LAST 5 DAYS.     Note: Gram Stain Report Called to,Read Back By and Verified With: ERIN ARNOLD 03/04/12 @ 11:30AM BY RUSCA.   Report Status 03/06/2012 FINAL   Final   URINE CULTURE     Status: Normal   Collection Time   03/04/12  3:50 AM      Component Value Range Status Comment   Specimen Description URINE, CATHETERIZED   Final    Special Requests PATIENT ON FOLLOWING VANCOMYCIN   Final    Culture  Setup Time 03/04/2012 11:16   Final    Colony Count NO GROWTH   Final    Culture NO GROWTH   Final    Report Status 03/05/2012 FINAL   Final     Studies/Results: No results found.   Assessment/Plan: Septic Arthritis/Osteomyelitis (staph aureus)  L TKR  Staph aureus bacteremia 2/2  ARF  Breast Cancer (L) T1 N1 (stage II)  MS  Constipation Total days of antibiotics 6 (vancomycin --> cubicin/rifampin--> ancef/rifampin)  Would-  Appreciate surgical removal of port  TEE (-), appreciate CV eval.    Watch Cr, continues to improve Recheck BCx for clearance of her staph Will need long term anbx (~ 6 weeks) as well as chemo. Could place [facility], then transition to a new port.      Watch coumadin/rifampin interactions   Johny Sax Infectious Diseases [phone] 03/07/2012, 12:15 PM   LOS: 5 days

## 2012-03-07 NOTE — Progress Notes (Signed)
DISCHARGE SUMMARY  Julie Ross  MR#: [id number]  DOB:1961/03/18  Date of Admission: 03/02/2012 Date of Discharge: tentatively planned for 03/08/2012  Attending Physician:MCCLUNG,JEFFREY T  Patient's WUJ:[id number], Julie Ross  Consults: Orthopedics Gen Surgery ID Nephrology Cardiology - for TEE  Disposition: transfer to CIR for ongoing inpt rehab  Follow-up Appts: Follow-up Information    Follow up with GRAVES,JOHN L, MD. Schedule an appointment as soon as possible for a visit in 10 days.   Contact information:   [address] [place] [address] [phone]         Tests Needing Follow-up: Ross cultures obtained on 03/07/2012 will need to be followed - at such time that said cultures are confirmed to be no growth, a PICC line will need to be placed to facilitate long term abx tx and short term chemotherapy  Discharge Diagnoses: Staph aureus bacteremia + septic arthritis - r/o endocarditits  Hypotension  Infected L TKR (original surgery Feb 2013)  thrombocytopenia  Acute kidney failure  Hyponatremia  Hypokalemia  Anemia  Breast CA undergoing chemo  Toxic metabolic encephalopathy  Multiple Sclerosis   Initial presentation: 51 yo female who was admitted by Ortho with knee pain. She presented w/ an acutely swollen and painful knee. She subsequently underwent an extensive 4-compartment synovectomy with irrigation and debridement of subcutaneous tissue, fascia, muscle, and bone. After her surgery, she became less responsive on the floor, had some lab values that were not normal, and [facility] was consulted to help manage patient medically. [facility] subsequently assume primary care of the pt as an inpatient.  Hospital Course:  Staph aureus bacteremia + septic arthritis - r/o endocarditits  Previously placed port-a-cath has been removed - TTE was w/o evidence of vegetation, therefore a TEE was performed - it did not reveal endocarditis - will need PICC for long term abx tx and for  temporary chemo access - will place once f/u Ross cx are confirmed to be sterile (drawn 03/07/2012) - CIR team confirms need to f/u Ross cx and ability to place PICC while pt on CIR  Infected L TKR (original surgery Feb 2013)  As per Ortho - drain out 03/07/2012 - DVT prophylaxis prescribed by Ortho  Hypotension  Resolved w/ volume and abx tx   thrombocytopenia  Improving with tx of infection and in absence of chemo - follow trend as pt continues to progress  Acute kidney failure  ATN due to vanc toxicity in conjunction with ARB/vol depletion - crt is steadily improving - cont to follow trend - Nephrology has signed off   Hyponatremia  Resolved w/ volume expansion   Hypokalemia  Resolved w/ replacement   Anemia  hgb stable/climbing   Breast CA undergoing chemo  I spoke w/ Dr. Park Breed who recommends holding all tx at this time (including herceptin) - pt to f/u with her once d/c from hospital   Toxic metabolic encephalopathy  Likely due to hypovolemia/hypoperfusion + acute renal failure + bactermia - MS has returned to baseline  Multiple Sclerosis  Well compensated  Medication List: To be updated at actual time of d/c   Exam: BP 135/63  Pulse 68  Temp 97.8 F (36.6 C) (Oral)  Resp 16  Ht 5\' 6"  (1.676 m)  Wt 88.451 kg (195 lb)  BMI 31.47 kg/m2  SpO2 99%  LMP 12/05/2011  Physical Exam: General: No acute respiratory distress Lungs: Clear to auscultation bilaterally without wheezes or crackles Cardiovascular: Regular rate and rhythm without murmur gallop or rub  Abdomen: Nontender,  nondistended, soft, bowel sounds positive, no rebound, no ascites, no appreciable mass Extremities: No significant cyanosis, clubbing, or edema bilateral lower extremities - L knee wrapped in ACE  PROTIME-INR     Status: Abnormal   Collection Time   03/07/12  6:00 AM      Component Value Range   Prothrombin Time 16.8 (*) 11.6 - 15.2 seconds   INR 1.40  0.00 - 1.49  RENAL FUNCTION PANEL      Status: Abnormal   Collection Time   03/07/12  6:00 AM      Component Value Range   Sodium 137  135 - 145 mEq/L   Potassium 4.1  3.5 - 5.1 mEq/L   Chloride 105  96 - 112 mEq/L   CO2 20  19 - 32 mEq/L   Glucose, Bld 96  70 - 99 mg/dL   BUN 38 (*) 6 - 23 mg/dL   Creatinine, Ser 6.57 (*) 0.50 - 1.10 mg/dL   Calcium 8.8  8.4 - 84.6 mg/dL   Phosphorus 6.1 (*) 2.3 - 4.6 mg/dL   Albumin 2.1 (*) 3.5 - 5.2 g/dL   GFR calc non Af Amer 15 (*) >90 mL/min   GFR calc Af Amer 17 (*) >90 mL/min   Time spent in discharge (includes decision making & examination of pt): >30 minutes  03/07/2012, 3:24 PM   Julie Blood, MD [facility] Office  [phone] Pager [phone]  On-Call/Text Page:      [email or web address]      password [facility]

## 2012-03-07 NOTE — Progress Notes (Addendum)
ANTICOAGULATION CONSULT NOTE - Follow Up Consult  Pharmacy Consult for coumadin Indication: VTE prophylaxis  No Known Allergies  Patient Measurements: Height: 5\' 6"  (167.6 cm) Weight: 195 lb (88.451 kg) IBW/kg (Calculated) : 59.3    Vital Signs: Temp: 97.7 F (36.5 C) (09/18 0758) Temp src: Oral (09/18 0758) BP: 136/58 mmHg (09/18 0758) Pulse Rate: 71  (09/18 0758)  Labs:  Basename 03/07/12 0600 03/06/12 0408 03/05/12 0905 03/05/12 0021 03/04/12 1622  HGB -- -- 9.5* 9.1* --  HCT -- -- 27.8* 26.4* 23.3*  PLT -- -- 103* 90* 81*  APTT -- -- 33 -- --  LABPROT 16.8* 15.5* 14.6 -- --  INR 1.40 1.20 1.12 -- --  HEPARINUNFRC -- -- -- -- --  CREATININE 3.29* 3.91* 4.25* -- --  CKTOTAL -- 58 -- -- --  CKMB -- -- -- -- --  TROPONINI -- -- -- -- --    Estimated Creatinine Clearance: 22.2 ml/min (by C-G formula based on Cr of 3.29).   Medications:  Scheduled:     .  ceFAZolin (ANCEF) IV  1 g Intravenous Q24H  . docusate sodium  100 mg Oral BID  . enoxaparin (LOVENOX) injection  30 mg Subcutaneous Q24H  . multivitamin with minerals  1 tablet Oral Daily  . pantoprazole  80 mg Oral Q1200  . rifampin  300 mg Oral Daily  . warfarin  10 mg Oral ONCE-1800  . Warfarin - Pharmacist Dosing Inpatient   Does not apply q1800  . DISCONTD: DAPTOmycin (CUBICIN)  IV  500 mg Intravenous Q24H  . DISCONTD: dexmedetomidine  0.4-1.2 mcg/kg/hr Intravenous To OR  . DISCONTD: DULoxetine  60 mg Oral Daily    Assessment: 51yo female, s/p I&D of knee for septic arthritis, to re-start Coumadin for VTE prophylaxis for 4 weeks (started 9/13). Coumadin was on hold for possible HD needs but SCr is improving as well as UOP.  Patient noted on rifampin which will likely increase dosing requirements. INR today is 1.4.   Patient also noted on ancef for MSSA bacteremia. As mentioned renal function is improving  Goal of Therapy:  INR 2-3 Monitor platelets by anticoagulation protocol: Yes   Plan:    -Coumadin 10mg  po today -Daily PT/INR -Will change ancef to 1gm q12h  Harland German, Pharm D 03/07/2012 8:03 AM

## 2012-03-08 ENCOUNTER — Inpatient Hospital Stay (HOSPITAL_COMMUNITY)
Admission: RE | Admit: 2012-03-08 | Discharge: 2012-03-16 | DRG: 462 | Disposition: A | Discharge status: Home Health | Payer: BC Managed Care – PPO | Source: Ambulatory Visit | Attending: Physical Medicine & Rehabilitation | Admitting: Physical Medicine & Rehabilitation

## 2012-03-08 ENCOUNTER — Inpatient Hospital Stay (HOSPITAL_COMMUNITY): Payer: BC Managed Care – PPO

## 2012-03-08 DIAGNOSIS — F329 Major depressive disorder, single episode, unspecified: Secondary | ICD-10-CM

## 2012-03-08 DIAGNOSIS — R7881 Bacteremia: Secondary | ICD-10-CM | POA: Diagnosis present

## 2012-03-08 DIAGNOSIS — D62 Acute posthemorrhagic anemia: Secondary | ICD-10-CM

## 2012-03-08 DIAGNOSIS — R110 Nausea: Secondary | ICD-10-CM

## 2012-03-08 DIAGNOSIS — K5900 Constipation, unspecified: Secondary | ICD-10-CM

## 2012-03-08 DIAGNOSIS — M1990 Unspecified osteoarthritis, unspecified site: Secondary | ICD-10-CM

## 2012-03-08 DIAGNOSIS — I10 Essential (primary) hypertension: Secondary | ICD-10-CM

## 2012-03-08 DIAGNOSIS — F411 Generalized anxiety disorder: Secondary | ICD-10-CM

## 2012-03-08 DIAGNOSIS — M009 Pyogenic arthritis, unspecified: Secondary | ICD-10-CM

## 2012-03-08 DIAGNOSIS — M869 Osteomyelitis, unspecified: Secondary | ICD-10-CM

## 2012-03-08 DIAGNOSIS — R0789 Other chest pain: Secondary | ICD-10-CM

## 2012-03-08 DIAGNOSIS — R5381 Other malaise: Secondary | ICD-10-CM

## 2012-03-08 DIAGNOSIS — G35 Multiple sclerosis: Secondary | ICD-10-CM

## 2012-03-08 DIAGNOSIS — Z5189 Encounter for other specified aftercare: Principal | ICD-10-CM

## 2012-03-08 DIAGNOSIS — C50919 Malignant neoplasm of unspecified site of unspecified female breast: Secondary | ICD-10-CM

## 2012-03-08 DIAGNOSIS — F3289 Other specified depressive episodes: Secondary | ICD-10-CM

## 2012-03-08 DIAGNOSIS — Z79899 Other long term (current) drug therapy: Secondary | ICD-10-CM

## 2012-03-08 DIAGNOSIS — A4901 Methicillin susceptible Staphylococcus aureus infection, unspecified site: Secondary | ICD-10-CM

## 2012-03-08 DIAGNOSIS — Z96659 Presence of unspecified artificial knee joint: Secondary | ICD-10-CM

## 2012-03-08 DIAGNOSIS — G35D Multiple sclerosis, unspecified: Secondary | ICD-10-CM | POA: Diagnosis present

## 2012-03-08 DIAGNOSIS — N179 Acute kidney failure, unspecified: Secondary | ICD-10-CM

## 2012-03-08 LAB — CBC
HCT: 25 % — ABNORMAL LOW (ref 36.0–46.0)
Hemoglobin: 8.2 g/dL — ABNORMAL LOW (ref 12.0–15.0)
MCH: 27.5 pg (ref 26.0–34.0)
MCHC: 32.8 g/dL (ref 30.0–36.0)
MCV: 83.9 fL (ref 78.0–100.0)
Platelets: 282 10*3/uL (ref 150–400)
RBC: 2.98 MIL/uL — ABNORMAL LOW (ref 3.87–5.11)
RDW: 18.6 % — ABNORMAL HIGH (ref 11.5–15.5)
WBC: 4 10*3/uL (ref 4.0–10.5)

## 2012-03-08 LAB — PROTIME-INR
INR: 1.77 — ABNORMAL HIGH (ref 0.00–1.49)
Prothrombin Time: 20 seconds — ABNORMAL HIGH (ref 11.6–15.2)

## 2012-03-08 LAB — RENAL FUNCTION PANEL
Albumin: 2.1 g/dL — ABNORMAL LOW (ref 3.5–5.2)
BUN: 36 mg/dL — ABNORMAL HIGH (ref 6–23)
CO2: 20 mEq/L (ref 19–32)
Calcium: 9.1 mg/dL (ref 8.4–10.5)
Chloride: 107 mEq/L (ref 96–112)
Creatinine, Ser: 2.82 mg/dL — ABNORMAL HIGH (ref 0.50–1.10)
GFR calc Af Amer: 21 mL/min — ABNORMAL LOW (ref >90)
GFR calc non Af Amer: 18 mL/min — ABNORMAL LOW (ref >90)
Glucose, Bld: 129 mg/dL — ABNORMAL HIGH (ref 70–99)
Phosphorus: 4.9 mg/dL — ABNORMAL HIGH (ref 2.3–4.6)
Potassium: 4.1 mEq/L (ref 3.5–5.1)
Sodium: 139 mEq/L (ref 135–145)

## 2012-03-08 MED ORDER — CEFAZOLIN SODIUM 1-5 GM-% IV SOLN
1.0000 g | Freq: Two times a day (BID) | INTRAVENOUS | Status: DC
  Administered 2012-03-08 – 2012-03-10 (×3): 1 g via INTRAVENOUS
  Filled 2012-03-08 (×5): qty 50

## 2012-03-08 MED ORDER — WARFARIN SODIUM 7.5 MG PO TABS
7.5000 mg | ORAL_TABLET | Freq: Once | ORAL | Status: AC
  Administered 2012-03-08: 7.5 mg via ORAL
  Filled 2012-03-08: qty 1

## 2012-03-08 MED ORDER — PANTOPRAZOLE SODIUM 40 MG PO TBEC
80.0000 mg | DELAYED_RELEASE_TABLET | Freq: Every day | ORAL | Status: DC
  Administered 2012-03-09 – 2012-03-15 (×7): 80 mg via ORAL
  Filled 2012-03-08 (×8): qty 2

## 2012-03-08 MED ORDER — ONDANSETRON HCL 4 MG/2ML IJ SOLN: 4.0000 mg | Freq: Four times a day (QID) | INTRAMUSCULAR | Status: DC

## 2012-03-08 MED ORDER — WARFARIN - PHARMACIST DOSING INPATIENT: Freq: Every day | Status: DC

## 2012-03-08 MED ORDER — ONDANSETRON HCL 4 MG/2ML IJ SOLN
4.0000 mg | Freq: Four times a day (QID) | INTRAMUSCULAR | Status: DC
  Administered 2012-03-09: 4 mg via INTRAVENOUS
  Filled 2012-03-08 (×2): qty 2

## 2012-03-08 MED ORDER — DOCUSATE SODIUM 100 MG PO CAPS: 100.0000 mg | ORAL_CAPSULE | Freq: Two times a day (BID) | ORAL | Status: DC

## 2012-03-08 MED ORDER — DIPHENHYDRAMINE HCL 12.5 MG/5ML PO ELIX
12.5000 mg | ORAL_SOLUTION | Freq: Four times a day (QID) | ORAL | Status: DC | PRN
  Administered 2012-03-10 – 2012-03-14 (×6): 25 mg via ORAL
  Filled 2012-03-08 (×9): qty 10

## 2012-03-08 MED ORDER — FLEET ENEMA 7-19 GM/118ML RE ENEM
1.0000 | ENEMA | Freq: Once | RECTAL | Status: DC
  Filled 2012-03-08: qty 1

## 2012-03-08 MED ORDER — PROCHLORPERAZINE MALEATE 5 MG PO TABS
5.0000 mg | ORAL_TABLET | Freq: Four times a day (QID) | ORAL | Status: DC | PRN
  Administered 2012-03-09 – 2012-03-11 (×5): 5 mg via ORAL
  Filled 2012-03-08 (×2): qty 2

## 2012-03-08 MED ORDER — CLONAZEPAM 0.5 MG PO TABS
0.5000 mg | ORAL_TABLET | Freq: Every evening | ORAL | Status: DC | PRN
  Administered 2012-03-09 – 2012-03-12 (×3): 0.5 mg via ORAL
  Filled 2012-03-08 (×3): qty 1

## 2012-03-08 MED ORDER — ENOXAPARIN SODIUM 30 MG/0.3ML ~~LOC~~ SOLN: 30.0000 mg | SUBCUTANEOUS | Status: DC

## 2012-03-08 MED ORDER — ALUM & MAG HYDROXIDE-SIMETH 200-200-20 MG/5ML PO SUSP
30.0000 mL | ORAL | Status: DC | PRN
  Administered 2012-03-15: 30 mL via ORAL
  Filled 2012-03-08 (×4): qty 30

## 2012-03-08 MED ORDER — TRAZODONE HCL 50 MG PO TABS
25.0000 mg | ORAL_TABLET | Freq: Every evening | ORAL | Status: DC | PRN
Start: 2012-03-08 — End: 2012-03-11

## 2012-03-08 MED ORDER — ONDANSETRON HCL 4 MG PO TABS
4.0000 mg | ORAL_TABLET | Freq: Four times a day (QID) | ORAL | Status: DC
  Administered 2012-03-08 – 2012-03-16 (×29): 4 mg via ORAL
  Filled 2012-03-08 (×36): qty 1

## 2012-03-08 MED ORDER — ONDANSETRON HCL 4 MG/2ML IJ SOLN
4.0000 mg | Freq: Once | INTRAMUSCULAR | Status: AC
  Administered 2012-03-08: 4 mg via INTRAVENOUS
  Filled 2012-03-08: qty 2

## 2012-03-08 MED ORDER — ENOXAPARIN SODIUM 40 MG/0.4ML ~~LOC~~ SOLN
40.0000 mg | SUBCUTANEOUS | Status: DC
  Administered 2012-03-08: 40 mg via SUBCUTANEOUS
  Filled 2012-03-08 (×2): qty 0.4

## 2012-03-08 MED ORDER — TRAMADOL HCL 50 MG PO TABS
50.0000 mg | ORAL_TABLET | ORAL | Status: DC | PRN
  Administered 2012-03-09: 50 mg via ORAL
  Administered 2012-03-09 – 2012-03-10 (×2): 100 mg via ORAL
  Administered 2012-03-10 – 2012-03-11 (×3): 50 mg via ORAL
  Administered 2012-03-13: 100 mg via ORAL
  Administered 2012-03-13 (×2): 50 mg via ORAL
  Administered 2012-03-13 – 2012-03-16 (×6): 100 mg via ORAL
  Filled 2012-03-08: qty 1
  Filled 2012-03-08 (×11): qty 2

## 2012-03-08 MED ORDER — GUAIFENESIN-DM 100-10 MG/5ML PO SYRP
5.0000 mL | ORAL_SOLUTION | Freq: Four times a day (QID) | ORAL | Status: DC | PRN
  Administered 2012-03-09 – 2012-03-10 (×2): 5 mL via ORAL
  Administered 2012-03-11: 10 mL via ORAL
  Administered 2012-03-11: 5 mL via ORAL
  Administered 2012-03-12: 10 mL via ORAL
  Filled 2012-03-08: qty 5
  Filled 2012-03-08 (×2): qty 10
  Filled 2012-03-08 (×3): qty 5

## 2012-03-08 MED ORDER — PROCHLORPERAZINE EDISYLATE 5 MG/ML IJ SOLN
5.0000 mg | Freq: Four times a day (QID) | INTRAMUSCULAR | Status: DC | PRN
  Filled 2012-03-08: qty 2

## 2012-03-08 MED ORDER — PROCHLORPERAZINE 25 MG RE SUPP
12.5000 mg | Freq: Four times a day (QID) | RECTAL | Status: DC | PRN
  Filled 2012-03-08: qty 1

## 2012-03-08 MED ORDER — ACETAMINOPHEN 325 MG PO TABS
325.0000 mg | ORAL_TABLET | ORAL | Status: DC | PRN
  Administered 2012-03-10 – 2012-03-16 (×9): 650 mg via ORAL
  Filled 2012-03-08 (×9): qty 2

## 2012-03-08 MED ORDER — FLEET ENEMA 7-19 GM/118ML RE ENEM
1.0000 | ENEMA | Freq: Once | RECTAL | Status: AC | PRN
  Filled 2012-03-08: qty 1

## 2012-03-08 MED ORDER — WARFARIN SODIUM 10 MG PO TABS
10.0000 mg | ORAL_TABLET | Freq: Once | ORAL | Status: DC
  Filled 2012-03-08: qty 1

## 2012-03-08 MED ORDER — ALUM & MAG HYDROXIDE-SIMETH 200-200-20 MG/5ML PO SUSP
30.0000 mL | ORAL | Status: DC | PRN
  Administered 2012-03-12 – 2012-03-13 (×3): 30 mL via ORAL

## 2012-03-08 MED ORDER — SENNOSIDES-DOCUSATE SODIUM 8.6-50 MG PO TABS
2.0000 | ORAL_TABLET | Freq: Every day | ORAL | Status: DC
  Administered 2012-03-08: 2 via ORAL
  Filled 2012-03-08 (×2): qty 2

## 2012-03-08 MED ORDER — ONDANSETRON HCL 4 MG PO TABS: 4.0000 mg | ORAL_TABLET | Freq: Four times a day (QID) | ORAL | Status: DC

## 2012-03-08 MED ORDER — BISACODYL 10 MG RE SUPP: 10.0000 mg | Freq: Every day | RECTAL | Status: DC | PRN

## 2012-03-08 NOTE — Discharge Summary (Signed)
Physician Discharge Summary  Julie Ross AVW:[id number] DOB: 12-Oct-1960 DOA: 03/02/2012  PCP: [name]  Admit date: 03/02/2012 Discharge date: 03/08/2012 1.   Disposition: transfer to CIR for ongoing inpt rehab  Follow-up Appts:  Follow-up Information    Follow up with GRAVES,JOHN L, MD. Schedule an appointment as soon as possible for a visit in 10 days.    Contact information:    [address]  [place] [address]  [phone]        Tests Needing Follow-up:  Blood cultures obtained on 03/07/2012 will need to be followed - at such time that said cultures are confirmed to be no growth, a PICC line will need to be placed to facilitate long term abx tx and short term chemotherapy  --As renal function continues to improve, cefazolin dosing will need to be increased   Discharge Diagnoses:  Staph aureus bacteremia + septic arthritis - r/o endocarditits  Hypotension  Infected L TKR (original surgery Feb 2013)  thrombocytopenia  Acute kidney failure  Hyponatremia  Hypokalemia  Anemia  Breast CA undergoing chemo  Toxic metabolic encephalopathy  Multiple Sclerosis  Initial presentation:  51 yo female who was admitted by Ortho with knee pain. She presented w/ an acutely swollen and painful knee. She subsequently underwent an extensive 4-compartment synovectomy with irrigation and debridement of subcutaneous tissue, fascia, muscle, and bone. After her surgery, she became less responsive on the floor, had some lab values that were not normal, and [facility] was consulted to help manage patient medically. TRH subsequently assume primary care of the pt as an inpatient.    Hospital Course:  Staph aureus bacteremia + septic arthritis - r/o endocarditits  Previously placed port-a-cath has been removed - TTE was w/o evidence of vegetation, therefore a TEE was performed - it did not reveal endocarditis - will need PICC for long term abx tx and for temporary chemo access - will place once f/u  blood cx are confirmed to be sterile (drawn 03/07/2012) - CIR team confirms need to f/u blood cx and ability to place PICC while pt on CIR  9/18 blood cultures negative at 24hr so far Infected L TKR (original surgery Feb 2013)  As per Ortho - drain out 03/07/2012 - DVT prophylaxis prescribed by Ortho  Hypotension  Resolved w/ volume and abx tx  thrombocytopenia  Improving with tx of infection and in absence of chemo - follow trend as pt continues to progress  Acute kidney failure  ATN due to vanc toxicity in conjunction with ARB/vol depletion - crt is steadily improving - cont to follow trend - Nephrology has signed off  -avoid diuretic, losartan for now Hyponatremia  Resolved w/ volume expansion  Hypokalemia  Resolved w/ replacement  Anemia  hgb stable/climbing  Breast CA undergoing chemo  I spoke w/ Dr. Park Breed who recommends holding all tx at this time (including herceptin) - pt to f/u with her once d/c from hospital  Toxic metabolic encephalopathy  Likely due to hypovolemia/hypoperfusion + acute renal failure + bactermia - MS has returned to baseline  Multiple Sclerosis  Well compensated   Discharge Condition: stable  Disposition:  Follow-up Information    Follow up with GRAVES,JOHN L, MD. Schedule an appointment as soon as possible for a visit in 10 days.   Contact information:   [address] [place] [address] [phone]          Diet: regular Wt Readings from Last 3 Encounters:  03/02/12 88.451 kg (195 lb)  03/02/12 88.451 kg (195 lb)  03/02/12 88.451 kg (195 lb)       Consultants: Dr. Ninetta Lights, ID Dr. Luiz Blare Dr. Kathrene Bongo Dr. Abbey Chatters [facility]  Discharge Exam: Filed Vitals:   03/08/12 0646  BP: 160/71  Pulse: 71  Temp: 98.3 F (36.8 C)  Resp: 18   Filed Vitals:   03/07/12 1144 03/07/12 1644 03/07/12 2200 03/08/12 0646  BP: 135/63 142/64 158/75 160/71  Pulse: 68 67 73 71  Temp: 97.8 F (36.6 C) 98.5 F (36.9 C) 97.3 F (36.3 C) 98.3 F  (36.8 C)  TempSrc: Oral Oral Oral   Resp:    18  Height:      Weight:      SpO2: 99% 99% 96% 96%   General: A&O x 3, NAD, pleasant, cooperative Cardiovascular: RRR, no rub, no gallop, no S3 Respiratory: CTAB, no wheeze, no rhonchi Abdomen:soft, nontender, nondistended, positive bowel sounds Ext:  Trace edema, legs, no rash, no lymphangitis, no petechiae  Discharge Instructions      Discharge Orders    Future Appointments: Provider: Department: Dept Phone: Center:   03/09/2012 9:00 AM Krista Blue [facility] [phone] None   03/09/2012 9:30 AM Victorino December, MD [facility] [phone] None   03/09/2012 10:30 AM [facility] [phone] None   03/10/2012 10:00 AM [facility] Inj Nurse [facility] [phone] None   03/16/2012 11:00 AM Beverely Pace Shumate [facility] [phone] None   03/16/2012 11:30 AM Victorino December, MD [facility] [phone] None   03/16/2012 12:30 PM [facility] [phone] None   03/19/2012 2:00 PM Nonda Lou, PT [facility] [facility] [phone] [facility]   03/21/2012 2:00 PM Hermenia Bers, PTA [facility] [facility] [phone] [facility]   03/22/2012 9:00 AM [facility] [phone] None   03/23/2012 9:30 AM Beverely Pace Shumate [facility] [phone] None   03/23/2012 10:00 AM Victorino December, MD [facility] [phone] None   03/23/2012 11:00 AM [facility] [phone] None   03/26/2012 2:00 PM Nonda Lou, PT [facility] [phone] [facility]   03/28/2012 2:00 PM Hermenia Bers, PTA [facility] [phone] [facility]   03/29/2012 9:00 AM Krista Blue [facility] [phone] None   03/29/2012 9:30 AM Victorino December, MD [facility] [phone] None   03/30/2012 2:15 PM [facility] [phone] None   03/31/2012 10:00 AM [facility] Inj Nurse [facility] [phone] None   04/06/2012 10:00 AM Krista Blue [facility] [phone] None   04/06/2012 10:30 AM Victorino December, MD [facility] [phone] None   04/06/2012 11:45 AM [facility] [phone] None       Medication List     As of 03/08/2012 12:56 PM    STOP taking these medications         dexamethasone 4 MG tablet   Commonly known as: DECADRON      lidocaine-prilocaine cream   Commonly known as: EMLA      LORazepam 0.5 MG tablet   Commonly known as: ATIVAN      losartan-hydrochlorothiazide 50-12.5 MG per tablet   Commonly known as: HYZAAR      naproxen sodium 550 MG tablet   Commonly known as: ANAPROX      ondansetron 8 MG tablet   Commonly known as: ZOFRAN      PRESCRIPTION MEDICATION      UNABLE TO FIND      TAKE these medications  baclofen 10 MG tablet   Commonly known as: LIORESAL   Take 10 mg by mouth 3 (three) times daily.      CALCIUM-MAGNESIUM PO   Take 1 tablet by mouth daily.      clonazePAM 0.5 MG tablet   Commonly known as: KLONOPIN   Take 0.5 mg by mouth at bedtime as needed. For sleep      docusate sodium 100 MG capsule   Commonly known as: COLACE   Take 1 capsule (100 mg total) by mouth 2 (two) times daily.      DULoxetine 60 MG capsule   Commonly known as: CYMBALTA   Take 60 mg by mouth daily.      esomeprazole 40 MG capsule   Commonly known as: NEXIUM   Take 40 mg by mouth daily before breakfast.      gabapentin 600 MG tablet   Commonly known as: NEURONTIN   Take 600 mg by mouth 3 (three) times daily.      HYDROcodone-acetaminophen 10-325 MG per tablet   Commonly known as: NORCO   Take 1 tablet by mouth every 8 (eight) hours as needed. As needed for pain.      multivitamins ther. w/minerals Tabs   Take 1 tablet by mouth daily.      prochlorperazine 10 MG tablet   Commonly known as: COMPAZINE   Take 1 tablet (10 mg total) by mouth every 6 (six) hours as needed (Nausea  or vomiting).      prochlorperazine 25 MG suppository   Commonly known as: COMPAZINE   Place 1 suppository (25 mg total) rectally every 12 (twelve) hours as needed for nausea.       Additional Meds to continue: Ancef one gram IV q 12hrs Senna--2 tabs q day lovenox 30mg  [place] q day Rifampin 300mg  po q day Warfarin--Pharmacy to Dose--Consult them   The results of significant diagnostics from this hospitalization (including imaging, microbiology, ancillary and laboratory) are listed below for reference.    Significant Diagnostic Studies: Ct Head Wo Contrast  03/05/2012  *RADIOLOGY REPORT*  Clinical Data:  Altered mental status.  Breast cancer.  CT HEAD WITHOUT CONTRAST  Technique:  Contiguous axial images were obtained from the base of the skull through the vertex without contrast.  Comparison: None.  Findings: There is no evidence for acute infarction, intracranial hemorrhage, mass lesion, hydrocephalus, or extra-axial fluid. Borderline mild atrophy.  No definite white matter disease.  No osseous destructive lesion.  Sinuses and mastoids clear.  IMPRESSION: Borderline mild atrophy.  No acute intracranial findings.   Original Report Authenticated By: Elsie Stain, M.D.    Dg Chest Port 1 View  03/04/2012  *RADIOLOGY REPORT*  Clinical Data: Bacteremia.  Infected knee prosthesis.  Acute renal insufficiency.  PORTABLE CHEST - 1 VIEW 03/04/2012 1309 hours:  Comparison: Portable chest x-ray 01/16/2012 and two-view chest x- ray 08/11/2011.  Findings: Suboptimal inspiration due to body habitus which accounts for crowded bronchovascular markings at the bases and accentuates the cardiac silhouette.  Taking this into account, cardiac silhouette enlarged but stable.  Lungs clear.  Pulmonary venous hypertension without overt edema.  Right subclavian Port-A-Cath tip projects over the lower SVC.  IMPRESSION: Suboptimal inspiration.  Stable cardiomegaly.  No acute cardiopulmonary disease.   Original Report  Authenticated By: Arnell Sieving, M.D.      Microbiology: Recent Results (from the past 240 hour(s))  SURGICAL PCR SCREEN     Status: Abnormal   Collection Time   03/02/12  1:35 PM      Component Value Range Status Comment   MRSA, PCR NEGATIVE  NEGATIVE Final    Staphylococcus aureus POSITIVE (*) NEGATIVE Final   ANAEROBIC CULTURE     Status: Normal   Collection Time   03/02/12  4:20 PM      Component Value Range Status Comment   Specimen Description SYNOVIAL FLUID KNEE LEFT   Final    Special Requests RECEIVED ON SWAB   Final    Gram Stain     Final    Value: MODERATE WBC PRESENT, PREDOMINANTLY PMN     RARE GRAM POSITIVE COCCI     IN PAIRS Gram Stain Report Called to,Read Back By and Verified With: Gram Stain Report Called to,Read Back By and Verified With: DR GRAVES AT 1652 03/02/12 BY ROMEROJ Performed at [facility]   Culture NO ANAEROBES ISOLATED   Final    Report Status 03/07/2012 FINAL   Final   BODY FLUID CULTURE     Status: Normal   Collection Time   03/02/12  4:20 PM      Component Value Range Status Comment   Specimen Description SYNOVIAL FLUID KNEE LEFT   Final    Special Requests RECEIVED ON SWAB   Final    Gram Stain     Final    Value: MODERATE WBC PRESENT, PREDOMINANTLY PMN     RARE GRAM POSITIVE COCCI     IN PAIRS Gram Stain Report Called to,Read Back By and Verified With: Gram Stain Report Called to,Read Back By and Verified With: DR GRAVES AT 1652 03/02/12 BY ROMEROJ Performed at [facility]   Culture     Final    Value: MODERATE STAPHYLOCOCCUS AUREUS     Note: RIFAMPIN AND GENTAMICIN SHOULD NOT BE USED AS SINGLE DRUGS FOR TREATMENT OF STAPH INFECTIONS.   Report Status 03/04/2012 FINAL   Final    Organism ID, Bacteria STAPHYLOCOCCUS AUREUS   Final   GRAM STAIN     Status: Normal   Collection Time   03/02/12  4:20 PM      Component Value Range Status Comment   Specimen Description SYNOVIAL FLUID KNEE LEFT   Final    Special Requests NONE    Final    Gram Stain     Final    Value: MODERATE WBC PRESENT, PREDOMINANTLY PMN     RARE GRAM POSITIVE COCCI IN PAIRS     CALLED TO DR. Luiz Blare 9.13.13 AT 1652 BY ROMEROJ   Report Status 03/02/2012 FINAL   Final   CULTURE, BLOOD (ROUTINE X 2)     Status: Normal   Collection Time   03/03/12 12:25 AM      Component Value Range Status Comment   Specimen Description BLOOD RIGHT HAND   Final    Special Requests BOTTLES DRAWN AEROBIC AND ANAEROBIC 5CC EACH   Final    Culture  Setup Time 03/03/2012 11:01   Final    Culture     Final    Value: STAPHYLOCOCCUS AUREUS     Note: RIFAMPIN AND GENTAMICIN SHOULD NOT BE USED AS SINGLE DRUGS FOR TREATMENT OF STAPH INFECTIONS. This organism DOES NOT demonstrate inducible Clindamycin resistance in vitro.     Note: Gram Stain Report Called to,Read Back By and Verified With: ERIN ARNOLD 03/04/12 @ 11:30AM BY RUSCA.   Report Status 03/06/2012 FINAL   Final    Organism ID, Bacteria STAPHYLOCOCCUS AUREUS   Final   CULTURE, BLOOD (  ROUTINE X 2)     Status: Normal   Collection Time   03/03/12 12:40 AM      Component Value Range Status Comment   Specimen Description BLOOD RIGHT HAND   Final    Special Requests BOTTLES DRAWN AEROBIC AND ANAEROBIC 3CC EACH   Final    Culture  Setup Time 03/03/2012 11:01   Final    Culture     Final    Value: STAPHYLOCOCCUS AUREUS     Note: SUSCEPTIBILITIES PERFORMED ON PREVIOUS CULTURE WITHIN THE LAST 5 DAYS.     Note: Gram Stain Report Called to,Read Back By and Verified With: ERIN ARNOLD 03/04/12 @ 11:30AM BY RUSCA.   Report Status 03/06/2012 FINAL   Final   URINE CULTURE     Status: Normal   Collection Time   03/04/12  3:50 AM      Component Value Range Status Comment   Specimen Description URINE, CATHETERIZED   Final    Special Requests PATIENT ON FOLLOWING VANCOMYCIN   Final    Culture  Setup Time 03/04/2012 11:16   Final    Colony Count NO GROWTH   Final    Culture NO GROWTH   Final    Report Status 03/05/2012 FINAL    Final   CULTURE, BLOOD (ROUTINE X 2)     Status: Normal (Preliminary result)   Collection Time   03/07/12 12:30 PM      Component Value Range Status Comment   Specimen Description BLOOD RIGHT HAND   Final    Special Requests BOTTLES DRAWN AEROBIC ONLY 8CC   Final    Culture  Setup Time 03/07/2012 20:02   Final    Culture     Final    Value:        BLOOD CULTURE RECEIVED NO GROWTH TO DATE CULTURE WILL BE HELD FOR 5 DAYS BEFORE ISSUING A FINAL NEGATIVE REPORT   Report Status PENDING   Incomplete   CULTURE, BLOOD (ROUTINE X 2)     Status: Normal (Preliminary result)   Collection Time   03/07/12 12:40 PM      Component Value Range Status Comment   Specimen Description BLOOD RIGHT HAND   Final    Special Requests BOTTLES DRAWN AEROBIC ONLY [facility]   Final    Culture  Setup Time 03/07/2012 20:01   Final    Culture     Final    Value:        BLOOD CULTURE RECEIVED NO GROWTH TO DATE CULTURE WILL BE HELD FOR 5 DAYS BEFORE ISSUING A FINAL NEGATIVE REPORT   Report Status PENDING   Incomplete      Labs: Basic Metabolic Panel:  Lab 03/08/12 1610 03/07/12 0600 03/06/12 0408 03/05/12 0905 03/05/12 0016  NA 139 137 131* 129* 128*  K 4.1 4.1 -- -- --  CL 107 105 96 91* 92*  CO2 20 20 17* 21 20  GLUCOSE 129* 96 93 110* 138*  BUN 36* 38* 41* 35* 30*  CREATININE 2.82* 3.29* 3.91* 4.25* 4.10*  CALCIUM 9.1 8.8 8.9 9.0 8.7  MG -- -- -- -- --  PHOS 4.9* 6.1* 6.6* -- 7.7*   Liver Function Tests:  Lab 03/08/12 0643 03/07/12 0600 03/06/12 0408 03/05/12 0905 03/05/12 0016 03/04/12 0908 03/02/12 1402  AST -- -- -- 32 -- 29 45*  ALT -- -- -- 34 -- 34 57*  ALKPHOS -- -- -- 549* -- 419* 317*  BILITOT -- -- -- 0.6 -- 1.0  2.0*  PROT -- -- -- 6.9 -- 6.3 6.9  ALBUMIN 2.1* 2.1* 2.4* 2.6* 2.4* -- --   No results found for this basename: LIPASE:5,AMYLASE:5 in the last 168 hours No results found for this basename: AMMONIA:5 in the last 168 hours CBC:  Lab 03/08/12 0643 03/05/12 0905 03/05/12 0021 03/04/12 1622  03/04/12 0440 03/02/12 1403  WBC 4.0 5.0 5.9 6.8 6.3 --  NEUTROABS -- 3.5 -- -- -- 6.6  HGB 8.2* 9.5* 9.1* 7.9* 8.5* --  HCT 25.0* 27.8* 26.4* 23.3* 24.8* --  MCV 83.9 81.8 82.2 80.3 81.0 --  PLT 282 103* 90* 81* 89* --   Cardiac Enzymes:  Lab 03/06/12 0408  CKTOTAL 58  CKMB --  CKMBINDEX --  TROPONINI --   BNP: No components found with this basename: POCBNP:5 CBG:  Lab 03/07/12 1146 03/06/12 2138 03/06/12 1714 03/06/12 1245 03/06/12 0819  GLUCAP 128* 106* 87 121* 90    Time coordinating discharge:  Greater than 30 minutes  Signed:  Erine Phenix, DO [facility] Pager: [phone] 03/08/2012, 12:56 PM

## 2012-03-08 NOTE — Progress Notes (Signed)
ANTICOAGULATION CONSULT NOTE - Follow Up Consult  Pharmacy Consult for Coumadin Indication: VTE prophylaxis  No Known Allergies  Patient Measurements: Height: 6' (182.9 cm) Weight: 220 lb 7.4 oz (100 kg) IBW/kg (Calculated) : 73.1    Vital Signs: Temp: 98 F (36.7 C) (09/19 1800) Temp src: Oral (09/19 1800) BP: 178/78 mmHg (09/19 1800) Pulse Rate: 77  (09/19 1800)  Labs:  Basename 03/08/12 0643 03/07/12 0600 03/06/12 0408  HGB 8.2* -- --  HCT 25.0* -- --  PLT 282 -- --  APTT -- -- --  LABPROT 20.0* 16.8* 15.5*  INR 1.77* 1.40 1.20  HEPARINUNFRC -- -- --  CREATININE 2.82* 3.29* 3.91*  CKTOTAL -- -- 58  CKMB -- -- --  TROPONINI -- -- --    Estimated Creatinine Clearance: 30.6 ml/min (by C-G formula based on Cr of 2.82).   Medications:  Scheduled:     .  ceFAZolin (ANCEF) IV  1 g Intravenous Q12H  . enoxaparin  40 mg Subcutaneous Q24H  . ondansetron  4 mg Oral QID   Or  . ondansetron (ZOFRAN) IV  4 mg Intravenous QID  . pantoprazole  80 mg Oral Q1200  . senna-docusate  2 tablet Oral QHS  . sodium phosphate  1 enema Rectal Once  . warfarin  7.5 mg Oral ONCE-1800  . Warfarin - Pharmacist Dosing Inpatient   Does not apply q1800  . DISCONTD: enoxaparin (LOVENOX) injection  30 mg Subcutaneous Q24H    Assessment: Patient transferred to rehab unit this evening.  51yo female, s/p I&D of knee for septic arthritis on Coumadin for VTE prophylaxis for 4 weeks (started 9/13). Coumadin was on hold for possible HD needs but SCr is improving as well as UOP.  Patient was noted to be  on rifampin which likely increases coumadin dosing requirements. However, ID discontinued the rifampin today.  INR = 1.77, subtherapeutic but trending up.   Goal of Therapy:  INR 2-3 Monitor platelets by anticoagulation protocol: Yes   Plan:  -Change today's Coumadin dose to 7.5mg   -Daily PT/INR -Continue lovenox until INR therapeutic.   Noah Delaine, RPh Clinical Pharmacist Pager:  [phone]  03/08/2012 9:53 PM

## 2012-03-08 NOTE — Progress Notes (Addendum)
Met with patient at bedside. Noted nausea this morning. Has not had a BM for the past week which is her regular habit weekly. I have insurance approval to admit pt to inpt rehab today. I contacted Gus Puma PA to discuss when pt felt medically ready to d/c to CIR. He will assess at lunch and let me know from ortho standpoint. I will then contact Attending MD to clarify when felt medically ready. RN is aware. Please call me for any questions. [phone]. I have discussed with Gus Puma , PA and Dr.Tat and pt is medically ready to d/c to CIR today.

## 2012-03-08 NOTE — Progress Notes (Signed)
CIR from [facility] greatly appreciated. Patient was assessed and approved for CIR placement.  CSW will not pursue SNF placement as back up plan does not appear to be needed.  CSW signing off.  Lorri Frederick. [place]  [phone]

## 2012-03-08 NOTE — Progress Notes (Signed)
I agree with the following treatment note after reviewing documentation.   Johnston, Donabelle Molden Brynn   OTR/L Pager: [phone] Office: [phone] .   

## 2012-03-08 NOTE — Progress Notes (Signed)
Patient seen and examined.  Agree with PA's note.  

## 2012-03-08 NOTE — Progress Notes (Signed)
2 Days Post-Op  Subjective: C/o of some nausea this am, otherwise no c/o.  Objective: Vital signs in last 24 hours: Temp:  [97.3 F (36.3 C)-98.5 F (36.9 C)] 98.3 F (36.8 C) (09/19 0646) Pulse Rate:  [67-73] 71  (09/19 0646) Resp:  [18] 18  (09/19 0646) BP: (135-160)/(63-75) 160/71 mmHg (09/19 0646) SpO2:  [96 %-99 %] 96 % (09/19 0646) Last BM Date: 02/29/12  Intake/Output from previous day: 09/18 0701 - 09/19 0700 In: 1120 [P.O.:920; I.V.:200] Out: 2435 [Urine:2425; Drains:10] Intake/Output this shift:    General appearance: alert, cooperative, appears stated age and no distress Porta cath removal site: old SS drainage on dressing, removed approx 1/2 in of old wick; SS drainage, but there is a small indurated area along the superior border of the incision, and overall the site is very tender. WBC has trended down to 4.0 and she is afebrile and HD stable.  Lab Results:   [facility] 03/08/12 0643 03/05/12 0905  WBC 4.0 5.0  HGB 8.2* 9.5*  HCT 25.0* 27.8*  PLT 282 103*   BMET  Basename 03/08/12 0643 03/07/12 0600  NA 139 137  K 4.1 4.1  CL 107 105  CO2 20 20  GLUCOSE 129* 96  BUN 36* 38*  CREATININE 2.82* 3.29*  CALCIUM 9.1 8.8   PT/INR  Basename 03/08/12 0643 03/07/12 0600  LABPROT 20.0* 16.8*  INR 1.77* 1.40   ABG No results found for this basename: PHART:2,PCO2:2,PO2:2,HCO3:2 in the last 72 hours  Studies/Results: No results found.  Anti-infectives: Anti-infectives     Start     Dose/Rate Route Frequency Ordered Stop   03/07/12 1100   ceFAZolin (ANCEF) IVPB 1 g/50 mL premix        1 g 100 mL/hr over 30 Minutes Intravenous Every 12 hours 03/07/12 0812     03/06/12 2300   DAPTOmycin (CUBICIN) 500 mg in sodium chloride 0.9 % IVPB  Status:  Discontinued        500 mg 220 mL/hr over 30 Minutes Intravenous Every 24 hours 03/05/12 0856 03/06/12 0931   03/06/12 1200   DAPTOmycin (CUBICIN) 500 mg in sodium chloride 0.9 % IVPB  Status:  Discontinued       500 mg 220 mL/hr over 30 Minutes Intravenous Every 24 hours 03/05/12 0850 03/05/12 0856   03/06/12 1100   ceFAZolin (ANCEF) IVPB 1 g/50 mL premix  Status:  Discontinued        1 g 100 mL/hr over 30 Minutes Intravenous Every 24 hours 03/06/12 0931 03/07/12 0812   03/05/12 1000   rifampin (RIFADIN) capsule 300 mg        300 mg Oral Daily 03/05/12 0915     03/03/12 0400   vancomycin (VANCOCIN) IVPB 1000 mg/200 mL premix  Status:  Discontinued        1,000 mg 200 mL/hr over 60 Minutes Intravenous Every 12 hours 03/02/12 1946 03/04/12 1134   03/02/12 1656   polymyxin B 500,000 Units, bacitracin 50,000 Units in sodium chloride irrigation 0.9 % 500 mL irrigation  Status:  Discontinued          As needed 03/02/12 1656 03/02/12 1741          Assessment/Plan:  Patient Active Problem List  Diagnosis  . Osteoarthritis of both knees  . Osteoarthritis of left knee  . Cancer of upper-outer quadrant of female breast  . Multiple sclerosis  . Dehydration  . Seroma, postoperative  . Infected prosthetic knee joint  . Hypokalemia  .  Hyponatremia  . Bacteremia   s/p Procedure(s) (LRB) with comments: TRANSESOPHAGEAL ECHOCARDIOGRAM (TEE) (N/A) - Rm. 2927 Will continue to watch incision site, monitor labs Tomorrow will withdraw additional wicking from wound. (may need to take out middle stitch and repack incision if induration increases or drainage changes vol or character. Continue ABX (discussed above findings and plan with Dr. Abbey Chatters). Management of her other medical problems per medicine team.    LOS: 6 days    Julie Ross 03/08/2012

## 2012-03-08 NOTE — Progress Notes (Signed)
Subjective:     Patient reports pain as 3 on 0-10 scale.   Complains of nausea today.  Out of bed to the chair. Objective: Vital signs in last 24 hours: Temp:  [97.3 F (36.3 C)-98.3 F (36.8 C)] 98.2 F (36.8 C) (09/19 1434) Pulse Rate:  [71-73] 71  (09/19 1434) Resp:  [18] 18  (09/19 1434) BP: (158-162)/(69-75) 162/69 mmHg (09/19 1434) SpO2:  [96 %-97 %] 97 % (09/19 1434)  Intake/Output from previous day: 09/18 0701 - 09/19 0700 In: 920 [P.O.:920] Out: 2435 [Urine:2425; Drains:10] Intake/Output this shift: Total I/O In: 480 [P.O.:480] Out: 800 [Urine:800]   Basename 03/08/12 0643  HGB 8.2*    Basename 03/08/12 0643  WBC 4.0  RBC 2.98*  HCT 25.0*  PLT 282    Basename 03/08/12 0643 03/07/12 0600  NA 139 137  K 4.1 4.1  CL 107 105  CO2 20 20  BUN 36* 38*  CREATININE 2.82* 3.29*  GLUCOSE 129* 96  CALCIUM 9.1 8.8    Basename 03/08/12 0643 03/07/12 0600  LABPT -- --  INR 1.77* 1.40  left knee exam: Pain is intact.  Has moderate pain to range of motion.  No redness or streaking up the leg.her calf is soft and nontender.  NV intact out to her foot.    Assessment/Plan:    1.  6 days status post I&D left knee with replacement of polyethylene spacer. 2.  Septicemia.  Managed by ID. Plan: Okay to transfer to inpatient rehabilitation. Will need daily physical therapy, weightbearing as tolerated on the left.  I would like the therapist to work on knee range of motion as tolerated.  We'll hold off on CPM machine. Continue on IV antibiotics per ID. We'll follow along while on inpatient rehabilitation. Coumadin for VTE prophylaxis.  Julie Ross G 03/08/2012, 6:21 PM

## 2012-03-08 NOTE — Progress Notes (Signed)
Physical Therapy Treatment Patient Details Name: Julie Ross MRN: [id number] DOB: [birth year] Today's Date: 03/08/2012 Time: 4098-1191 PT Time Calculation (min): 26 min  PT Assessment / Plan / Recommendation Comments on Treatment Session  Pt mobility improving    Follow Up Recommendations  Inpatient Rehab    Barriers to Discharge        Equipment Recommendations  Rolling walker with 5" wheels    Recommendations for Other Services Rehab consult  Frequency Min 5X/week   Plan Discharge plan remains appropriate;Frequency remains appropriate    Precautions / Restrictions Precautions Precautions: Fall Required Braces or Orthoses: Knee Immobilizer - Left Knee Immobilizer - Left: On at all times Restrictions Weight Bearing Restrictions: Yes LLE Weight Bearing: Weight bearing as tolerated Other Position/Activity Restrictions: No ROM   Pertinent Vitals/Pain Pt reporting pain in L knee 6/10.  RN notified.     Mobility  Bed Mobility Bed Mobility: Sit to Supine;Scooting to HOB Sit to Supine: 4: Min assist;HOB flat Sit to Supine: Patient Percentage: 90% Details for Bed Mobility Assistance: Assist For L LE management secondary to pain and weakness Transfers Transfers: Sit to Stand;Stand to Sit Sit to Stand: 4: Min assist;From chair/3-in-1;With upper extremity assist Sit to Stand: Patient Percentage: 80% Stand to Sit: 4: Min guard;With upper extremity assist;With armrests;To chair/3-in-1 Stand Pivot Transfers: Not tested (comment) Details for Transfer Assistance: Cues for hand placement and L LE positioning required.  Assist to initiate standing secondary to pain.  Ambulation/Gait Ambulation/Gait Assistance: 4: Min assist Ambulation Distance (Feet): 25 Feet Assistive device: Rolling walker Ambulation/Gait Assistance Details: Step by step cueing for sequencing.   Gait Pattern: Step-to pattern;Decreased stride length Gait velocity: decreased    Exercises General Exercises - Lower  Extremity Ankle Circles/Pumps: AROM;10 reps;Both;Supine   PT Diagnosis:    PT Problem List:   PT Treatment Interventions:     PT Goals Acute Rehab PT Goals PT Goal Formulation: With patient/family Time For Goal Achievement: 03/18/12 Potential to Achieve Goals: Fair Pt will go Supine/Side to Sit: with supervision PT Goal: Supine/Side to Sit - Progress: Progressing toward goal Pt will go Sit to Supine/Side: with supervision PT Goal: Sit to Supine/Side - Progress: Progressing toward goal Pt will go Sit to Stand: with supervision PT Goal: Sit to Stand - Progress: Progressing toward goal Pt will go Stand to Sit: with supervision PT Goal: Stand to Sit - Progress: Progressing toward goal Pt will Stand: with supervision;6 - 10 min;with bilateral upper extremity support PT Goal: Stand - Progress: Progressing toward goal Pt will Ambulate: >150 feet;with supervision;with rolling walker PT Goal: Ambulate - Progress: Progressing toward goal Pt will Go Up / Down Stairs: 1-2 stairs;with min assist;with rolling walker PT Goal: Up/Down Stairs - Progress: Not met  Visit Information  Last PT Received On: 03/08/12 Assistance Needed: +1    Subjective Data  Subjective: I am going to rehab todayl Patient Stated Goal: Walk without pain.    Cognition  Overall Cognitive Status: Appears within functional limits for tasks assessed/performed Arousal/Alertness: Awake/alert Orientation Level: Appears intact for tasks assessed Behavior During Session: [facility] for tasks performed    Balance  Balance Balance Assessed: No  End of Session PT - End of Session Equipment Utilized During Treatment: Gait belt;Left knee immobilizer Activity Tolerance: Patient limited by fatigue;Patient limited by pain Patient left: in chair;with call bell/phone within reach;with family/visitor present Nurse Communication: Mobility status   GP     Kaaren Nass 03/08/2012, 5:16 PM

## 2012-03-08 NOTE — Progress Notes (Signed)
INFECTIOUS DISEASE PROGRESS NOTE  ID: RAISHA BRABENDER is a 51 y.o. female with  Principal Problem:  *Infected prosthetic knee joint Active Problems:  Cancer of upper-outer quadrant of female breast  Dehydration  Hypokalemia  Hyponatremia  Bacteremia  Subjective: C/o nausea, food tastes "soapy"  Abtx:  Anti-infectives     Start     Dose/Rate Route Frequency Ordered Stop   03/07/12 1100   ceFAZolin (ANCEF) IVPB 1 g/50 mL premix        1 g 100 mL/hr over 30 Minutes Intravenous Every 12 hours 03/07/12 0812     03/06/12 2300   DAPTOmycin (CUBICIN) 500 mg in sodium chloride 0.9 % IVPB  Status:  Discontinued        500 mg 220 mL/hr over 30 Minutes Intravenous Every 24 hours 03/05/12 0856 03/06/12 0931   03/06/12 1200   DAPTOmycin (CUBICIN) 500 mg in sodium chloride 0.9 % IVPB  Status:  Discontinued        500 mg 220 mL/hr over 30 Minutes Intravenous Every 24 hours 03/05/12 0850 03/05/12 0856   03/06/12 1100   ceFAZolin (ANCEF) IVPB 1 g/50 mL premix  Status:  Discontinued        1 g 100 mL/hr over 30 Minutes Intravenous Every 24 hours 03/06/12 0931 03/07/12 0812   03/05/12 1000   rifampin (RIFADIN) capsule 300 mg        300 mg Oral Daily 03/05/12 0915     03/03/12 0400   vancomycin (VANCOCIN) IVPB 1000 mg/200 mL premix  Status:  Discontinued        1,000 mg 200 mL/hr over 60 Minutes Intravenous Every 12 hours 03/02/12 1946 03/04/12 1134   03/02/12 1656   polymyxin B 500,000 Units, bacitracin 50,000 Units in sodium chloride irrigation 0.9 % 500 mL irrigation  Status:  Discontinued          As needed 03/02/12 1656 03/02/12 1741          Medications:  Scheduled:   .  ceFAZolin (ANCEF) IV  1 g Intravenous Q12H  . docusate sodium  100 mg Oral BID  . enoxaparin (LOVENOX) injection  30 mg Subcutaneous Q24H  . multivitamin with minerals  1 tablet Oral Daily  . ondansetron  4 mg Oral QID   Or  . ondansetron (ZOFRAN) IV  4 mg Intravenous QID  . ondansetron  4 mg Intravenous  Once  . pantoprazole  80 mg Oral Q1200  . rifampin  300 mg Oral Daily  . warfarin  10 mg Oral ONCE-1800  . warfarin  10 mg Oral ONCE-1800  . Warfarin - Pharmacist Dosing Inpatient   Does not apply q1800    Objective: Vital signs in last 24 hours: Temp:  [97.3 F (36.3 C)-98.5 F (36.9 C)] 98.2 F (36.8 C) (09/19 1434) Pulse Rate:  [67-73] 71  (09/19 1434) Resp:  [18] 18  (09/19 1434) BP: (142-162)/(64-75) 162/69 mmHg (09/19 1434) SpO2:  [96 %-99 %] 97 % (09/19 1434)   General appearance: alert, cooperative and no distress Resp: clear to auscultation bilaterally Cardio: regular rate and rhythm GI: normal findings: bowel sounds normal and soft, non-tender L knee wrapped.  R chest wound dressed.   Lab Results  Basename 03/08/12 0643 03/07/12 0600  WBC 4.0 --  HGB 8.2* --  HCT 25.0* --  NA 139 137  K 4.1 4.1  CL 107 105  CO2 20 20  BUN 36* 38*  CREATININE 2.82* 3.29*  GLU -- --  Liver Panel  Basename 03/08/12 0643 03/07/12 0600  PROT -- --  ALBUMIN 2.1* 2.1*  AST -- --  ALT -- --  ALKPHOS -- --  BILITOT -- --  BILIDIR -- --  IBILI -- --   Sedimentation Rate No results found for this basename: ESRSEDRATE in the last 72 hours C-Reactive Protein No results found for this basename: CRP:2 in the last 72 hours  Microbiology: Recent Results (from the past 240 hour(s))  SURGICAL PCR SCREEN     Status: Abnormal   Collection Time   03/02/12  1:35 PM      Component Value Range Status Comment   MRSA, PCR NEGATIVE  NEGATIVE Final    Staphylococcus aureus POSITIVE (*) NEGATIVE Final   ANAEROBIC CULTURE     Status: Normal   Collection Time   03/02/12  4:20 PM      Component Value Range Status Comment   Specimen Description SYNOVIAL FLUID KNEE LEFT   Final    Special Requests RECEIVED ON SWAB   Final    Gram Stain     Final    Value: MODERATE WBC PRESENT, PREDOMINANTLY PMN     RARE GRAM POSITIVE COCCI     IN PAIRS Gram Stain Report Called to,Read Back By and  Verified With: Gram Stain Report Called to,Read Back By and Verified With: DR GRAVES AT 1652 03/02/12 BY ROMEROJ Performed at [facility]   Culture NO ANAEROBES ISOLATED   Final    Report Status 03/07/2012 FINAL   Final   BODY FLUID CULTURE     Status: Normal   Collection Time   03/02/12  4:20 PM      Component Value Range Status Comment   Specimen Description SYNOVIAL FLUID KNEE LEFT   Final    Special Requests RECEIVED ON SWAB   Final    Gram Stain     Final    Value: MODERATE WBC PRESENT, PREDOMINANTLY PMN     RARE GRAM POSITIVE COCCI     IN PAIRS Gram Stain Report Called to,Read Back By and Verified With: Gram Stain Report Called to,Read Back By and Verified With: DR GRAVES AT 1652 03/02/12 BY ROMEROJ Performed at [facility]   Culture     Final    Value: MODERATE STAPHYLOCOCCUS AUREUS     Note: RIFAMPIN AND GENTAMICIN SHOULD NOT BE USED AS SINGLE DRUGS FOR TREATMENT OF STAPH INFECTIONS.   Report Status 03/04/2012 FINAL   Final    Organism ID, Bacteria STAPHYLOCOCCUS AUREUS   Final   GRAM STAIN     Status: Normal   Collection Time   03/02/12  4:20 PM      Component Value Range Status Comment   Specimen Description SYNOVIAL FLUID KNEE LEFT   Final    Special Requests NONE   Final    Gram Stain     Final    Value: MODERATE WBC PRESENT, PREDOMINANTLY PMN     RARE GRAM POSITIVE COCCI IN PAIRS     CALLED TO DR. Luiz Blare 9.13.13 AT 1652 BY ROMEROJ   Report Status 03/02/2012 FINAL   Final   CULTURE, BLOOD (ROUTINE X 2)     Status: Normal   Collection Time   03/03/12 12:25 AM      Component Value Range Status Comment   Specimen Description BLOOD RIGHT HAND   Final    Special Requests BOTTLES DRAWN AEROBIC AND ANAEROBIC [facility] EACH   Final    Culture  Setup  Time 03/03/2012 11:01   Final    Culture     Final    Value: STAPHYLOCOCCUS AUREUS     Note: RIFAMPIN AND GENTAMICIN SHOULD NOT BE USED AS SINGLE DRUGS FOR TREATMENT OF STAPH INFECTIONS. This organism DOES NOT demonstrate  inducible Clindamycin resistance in vitro.     Note: Gram Stain Report Called to,Read Back By and Verified With: ERIN ARNOLD 03/04/12 @ 11:30AM BY RUSCA.   Report Status 03/06/2012 FINAL   Final    Organism ID, Bacteria STAPHYLOCOCCUS AUREUS   Final   CULTURE, BLOOD (ROUTINE X 2)     Status: Normal   Collection Time   03/03/12 12:40 AM      Component Value Range Status Comment   Specimen Description BLOOD RIGHT HAND   Final    Special Requests BOTTLES DRAWN AEROBIC AND ANAEROBIC 3CC EACH   Final    Culture  Setup Time 03/03/2012 11:01   Final    Culture     Final    Value: STAPHYLOCOCCUS AUREUS     Note: SUSCEPTIBILITIES PERFORMED ON PREVIOUS CULTURE WITHIN THE LAST 5 DAYS.     Note: Gram Stain Report Called to,Read Back By and Verified With: ERIN ARNOLD 03/04/12 @ 11:30AM BY RUSCA.   Report Status 03/06/2012 FINAL   Final   URINE CULTURE     Status: Normal   Collection Time   03/04/12  3:50 AM      Component Value Range Status Comment   Specimen Description URINE, CATHETERIZED   Final    Special Requests PATIENT ON FOLLOWING VANCOMYCIN   Final    Culture  Setup Time 03/04/2012 11:16   Final    Colony Count NO GROWTH   Final    Culture NO GROWTH   Final    Report Status 03/05/2012 FINAL   Final   CULTURE, BLOOD (ROUTINE X 2)     Status: Normal (Preliminary result)   Collection Time   03/07/12 12:30 PM      Component Value Range Status Comment   Specimen Description BLOOD RIGHT HAND   Final    Special Requests BOTTLES DRAWN AEROBIC ONLY 8CC   Final    Culture  Setup Time 03/07/2012 20:02   Final    Culture     Final    Value:        BLOOD CULTURE RECEIVED NO GROWTH TO DATE CULTURE WILL BE HELD FOR 5 DAYS BEFORE ISSUING A FINAL NEGATIVE REPORT   Report Status PENDING   Incomplete   CULTURE, BLOOD (ROUTINE X 2)     Status: Normal (Preliminary result)   Collection Time   03/07/12 12:40 PM      Component Value Range Status Comment   Specimen Description BLOOD RIGHT HAND   Final     Special Requests BOTTLES DRAWN AEROBIC ONLY [facility]   Final    Culture  Setup Time 03/07/2012 20:01   Final    Culture     Final    Value:        BLOOD CULTURE RECEIVED NO GROWTH TO DATE CULTURE WILL BE HELD FOR 5 DAYS BEFORE ISSUING A FINAL NEGATIVE REPORT   Report Status PENDING   Incomplete     Studies/Results: No results found.   Assessment/Plan: Septic Arthritis/Osteomyelitis (staph aureus)  L TKR  Staph aureus (MSSA) bacteremia 2/2  ARF, resolving Breast Cancer (L) T1 N1 (stage II)  MS  Constipation  Total days of antibiotics 7 (vancomycin --> cubicin/rifampin--> ancef/rifampin)  Would-  Work up her nausea- check KUB Stop rifampin to see if this helps TEE (-)  Watch Cr, continues to improve  Recheck BCx ngtd 9-18, for clearance of her staph  Will need long term anbx (~ 6 weeks) as well as chemo. Could place [facility], then transition to a new port.  Will follow up at rehab     Johny Sax Infectious Diseases [phone] 03/08/2012, 3:38 PM   LOS: 6 days

## 2012-03-08 NOTE — Progress Notes (Signed)
Patient admit to rehab at 1800, with daughter at bed side. Patient alert and oriented x4.  Admission packet provide with explanation.  Side rails up x3, bed alarm in place, call bell within reach.

## 2012-03-08 NOTE — Progress Notes (Signed)
ANTICOAGULATION CONSULT NOTE - Follow Up Consult  Pharmacy Consult for Coumadin Indication: VTE prophylaxis  No Known Allergies  Patient Measurements: Height: 5\' 6"  (167.6 cm) Weight: 195 lb (88.451 kg) IBW/kg (Calculated) : 59.3    Vital Signs: Temp: 98.3 F (36.8 C) (09/19 0646) BP: 160/71 mmHg (09/19 0646) Pulse Rate: 71  (09/19 0646)  Labs:  Basename 03/08/12 0643 03/07/12 0600 03/06/12 0408  HGB 8.2* -- --  HCT 25.0* -- --  PLT 282 -- --  APTT -- -- --  LABPROT 20.0* 16.8* 15.5*  INR 1.77* 1.40 1.20  HEPARINUNFRC -- -- --  CREATININE 2.82* 3.29* 3.91*  CKTOTAL -- -- 58  CKMB -- -- --  TROPONINI -- -- --    Estimated Creatinine Clearance: 25.9 ml/min (by C-G formula based on Cr of 2.82).   Medications:  Scheduled:     .  ceFAZolin (ANCEF) IV  1 g Intravenous Q12H  . docusate sodium  100 mg Oral BID  . enoxaparin (LOVENOX) injection  30 mg Subcutaneous Q24H  . multivitamin with minerals  1 tablet Oral Daily  . pantoprazole  80 mg Oral Q1200  . rifampin  300 mg Oral Daily  . warfarin  10 mg Oral ONCE-1800  . Warfarin - Pharmacist Dosing Inpatient   Does not apply q1800    Assessment: 51yo female, s/p I&D of knee for septic arthritis, to re-start Coumadin for VTE prophylaxis for 4 weeks (started 9/13). Coumadin was on hold for possible HD needs but SCr is improving as well as UOP.  Patient noted on rifampin which will likely increase dosing requirements. INR subtherapeutic but trending up.   Goal of Therapy:  INR 2-3 Monitor platelets by anticoagulation protocol: Yes   Plan:  -Repeat Coumadin 10mg  po today -Daily PT/INR -Continue lovenox until INR therapeutic.  Verlene Mayer, PharmD, BCPS Pager [phone]  03/08/2012 11:16 AM

## 2012-03-08 NOTE — PMR Pre-admission (Signed)
PMR Admission Coordinator Pre-Admission Assessment  Patient: Julie Ross is an 51 y.o., female MRN: [id number] DOB: [birth year] Height: 5\' 6"  (167.6 cm) Weight: 88.451 kg (195 lb)  Insurance Information HMO:     PPO: yes     PCP:      IPA:      80/20:      OTHER:  PRIMARY: [employer] [place]      Policy#: [id number]      Subscriber: pt CM Name: Christie Beckers      Phone#: [phone] ext [phone]     Fax#: [phone] Pre-Cert#: [id number] auth until 03/14/12   Must send updates on 03/14/12   Employer: group [id number] Benefits:  Phone #: [phone]     Name: 9/18 Melissa Eff. Date: 06/21/11     Deduct: $3500 met      Out of Pocket Max: $4000 met      Life Max: unlimited CIR: 80%      SNF: 80% Outpatient: 80%     Co-Pay: 20% 30 visits /7 visits remaining Home Health: 80%      Co-Pay: 20% DME: 80%     Co-Pay: 20% Providers: in network  SECONDARY: none     Medicaid Application Date:       Case Manager:  Disability Application Date:       Case Worker:   Emergency Conservator, museum/gallery Information    Name Relation Home Work [place] [phone]  [phone]   Anavictoria, Wilk Daughter [phone]  [phone]     Current Medical History  Patient Admitting Diagnosis:Deconditioning do to septic left knee with acute renal failure  History of Present Illness: Julie Ross is a 51 y.o. female HTN, DJD and breast Ca who underwent L TKR in Feb 2013. She was diagnosed with breast Ca in June and started chemotherapy w docetaxel/carboplatin and getting herceptin weekly. She had L mastectomy in [place] bx in July and has been receiving regular chemoRx since. She presented to orthopedic office with painful swollen L knee on 9/13. Aspirate showed WBC's and + gram stain for staph aureus. BC positive for staph aureus. She was admitted and underwent I&D w poly exchange the same day. Was started on vancomycin for treatment. On 03/04/12, developed acute renal failure with hypoxia and  hypotension and poor UOP. Was started on IVF and ativan discontinued per renal input. Changed to IV daptomycin. ID following for input and reccommended TEE to rule out endocarditis aand addition of rifampin. Port-A-cath removed on 09/16. TEE without evidence of SBE. Acute renal failuren resolving and UOP improving.   Patient with chronic nausea at home and Attending MD to change pt to home med regimen. Patient with no BM for the past week which is her regular habit.  Past Medical History  Past Medical History  Diagnosis Date  . Hypertension   . Hypothyroidism   . Arthritis   . Neuromuscular disorder     MS TX WITH CYMBALTA   . Multiple sclerosis   . H/O colonoscopy   . H/O bone density study 03/2011  . Wears glasses   . Heart burn   . Depression   . Sleep apnea     MILD SLEEP APNEA, NO MACHINE  6 YRS AGO [facility]   . Breast cancer     lt breast    Family History  family history includes Breast cancer in her maternal grandmother and paternal aunt and Heart disease in her maternal grandfather.  Prior Rehab/Hospitalizations: home  health and outpt only  Current Medications  Current facility-administered medications:0.9 %  sodium chloride infusion, , Intravenous, Continuous, Lonia Blood, MD, Last Rate: 50 mL/hr at 03/07/12 2214;  acetaminophen (TYLENOL) suppository 650 mg, 650 mg, Rectal, Q6H PRN, Matthew Folks, PA;  acetaminophen (TYLENOL) tablet 650 mg, 650 mg, Oral, Q6H PRN, Matthew Folks, PA, 650 mg at 03/03/12 0358 alum & mag hydroxide-simeth (MAALOX/MYLANTA) 200-200-20 MG/5ML suspension 30 mL, 30 mL, Oral, Q4H PRN, Matthew Folks, PA;  ceFAZolin (ANCEF) IVPB 1 g/50 mL premix, 1 g, Intravenous, Q12H, Harvie Junior, MD, 1 g at 03/08/12 1103;  clonazePAM (KLONOPIN) tablet 0.5 mg, 0.5 mg, Oral, QHS PRN, Matthew Folks, PA, 0.5 mg at 03/05/12 2159;  docusate sodium (COLACE) capsule 100 mg, 100 mg, Oral, BID, Matthew Folks, PA, 100 mg at 03/08/12 1103 enoxaparin (LOVENOX)  injection 30 mg, 30 mg, Subcutaneous, Q24H, Calvert Cantor, MD, 30 mg at 03/07/12 2055;  multivitamin with minerals tablet 1 tablet, 1 tablet, Oral, Daily, Matthew Folks, PA, 1 tablet at 03/08/12 1103;  ondansetron (ZOFRAN) injection 4 mg, 4 mg, Intravenous, QID, Catarina Hartshorn, MD;  ondansetron [facility]) injection 4 mg, 4 mg, Intravenous, Once, Onalee Hua Tat, MD, 4 mg at 03/08/12 1232 ondansetron (ZOFRAN) tablet 4 mg, 4 mg, Oral, QID, Catarina Hartshorn, MD;  pantoprazole (PROTONIX) EC tablet 80 mg, 80 mg, Oral, Q1200, Matthew Folks, PA, 80 mg at 03/07/12 1259;  rifampin (RIFADIN) capsule 300 mg, 300 mg, Oral, Daily, Ginnie Smart, MD, 300 mg at 03/08/12 1103;  senna-docusate (Senokot-S) tablet 1 tablet, 1 tablet, Oral, QHS PRN, Matthew Folks, PA traMADol Janean Sark) tablet 50-100 mg, 50-100 mg, Oral, Q4H PRN, Alessandra Bevels. [place], [place], 100 mg at 03/07/12 2055;  warfarin (COUMADIN) tablet 10 mg, 10 mg, Oral, ONCE-1800, Benny Lennert, PHARMD, 10 mg at 03/07/12 1716;  warfarin (COUMADIN) tablet 10 mg, 10 mg, Oral, ONCE-1800, Catarina Hartshorn, MD;  Warfarin - Pharmacist Dosing Inpatient, , Does not apply, q1800, Benny Lennert, PHARMD DISCONTD: ondansetron [facility]) injection 4 mg, 4 mg, Intravenous, Q6H PRN, Matthew Folks, PA, 4 mg at 03/08/12 1610;  DISCONTD: ondansetron (ZOFRAN) tablet 4 mg, 4 mg, Oral, Q6H PRN, Matthew Folks, PA  Patients Current Diet: General  Precautions / Restrictions Precautions Precautions: Fall Restrictions Weight Bearing Restrictions: Yes LLE Weight Bearing: Weight bearing as tolerated Other Position/Activity Restrictions: No ROM   Prior Activity Level Limited Community (1-2x/wk): worked as Airline pilot. Cancer tx now Home Assistive Devices / Equipment Home Assistive Devices/Equipment: Cane (specify quad or straight);Raised toilet seat with rails;Shower chair with back Home Adaptive Equipment: Walker - rolling;Bedside commode/3-in-1  Prior Functional Level Prior Function Level of  Independence: Independent Able to Take Stairs?: Yes Driving: Yes Vocation: Full time employment Comments: accountant  Current Functional Level Cognition  Arousal/Alertness: Awake/alert Overall Cognitive Status: Appears within functional limits for tasks assessed/performed Difficult to assess due to: Level of arousal Orientation Level: Oriented X4    Extremity Assessment (includes Sensation/Coordination)  RUE ROM/Strength/Tone: WFL for tasks assessed  RLE ROM/Strength/Tone: Unable to fully assess (EOB only on eval)    ADLs  Grooming: Simulated;Set up Where Assessed - Grooming: Supported sitting Upper Body Bathing: Simulated;Chest;Set up Where Assessed - Upper Body Bathing: Supported sitting Lower Body Bathing: Simulated;Moderate assistance Where Assessed - Lower Body Bathing: Supported sitting Upper Body Dressing: Performed;Min guard Where Assessed - Upper Body Dressing: Supported standing Lower Body Dressing: Performed;Maximal assistance Where Assessed - Lower Body Dressing: Supported sitting Toilet Transfer: Simulated;Moderate  assistance Toilet Transfer Method: Sit to Barista: Raised toilet seat with arms (or 3-in-1 over toilet) Toileting - Clothing Manipulation and Hygiene: Simulated;Minimal assistance Where Assessed - Glass blower/designer Manipulation and Hygiene: Standing Equipment Used: Gait belt;Rolling walker Transfers/Ambulation Related to ADLs: Pt. able to ambulate ~5 ft from bed to chair using RW. Pt. was mod (A) for sit<>stand due to generalized weakness. ADL Comments: Pt. feeling very nauseated through out session and reports pain level 5/10. Pt. educated on donning KI and verbalized understanding.     Mobility  Bed Mobility: Supine to Sit;Sitting - Scoot to Edge of Bed Rolling Right: Not tested (comment) Rolling Right: Patient Percentage: 30% Rolling Left: Not tested (comment) Rolling Left: Patient Percentage: 30% Left Sidelying to Sit:  Not tested (comment) Supine to Sit: 4: Min guard;With rails;HOB elevated Supine to Sit: Patient Percentage: 30% Sitting - Scoot to Edge of Bed: 4: Min guard Sit to Supine: Not Tested (comment) Sit to Supine: Patient Percentage: 30%    Transfers  Transfers: Sit to Stand;Stand to Sit Sit to Stand: 3: Mod assist;From bed;With upper extremity assist Sit to Stand: Patient Percentage: 70% Stand to Sit: 4: Min guard;With upper extremity assist;With armrests;To chair/3-in-1 Stand to Sit: Patient Percentage: 70% Stand Pivot Transfers: Not tested (comment) Stand Pivot Transfers: Patient Percentage: 70% Lateral/Scoot Transfers: Not tested (comment) Lateral Transfers: Patient Percentage: 30%    Ambulation / Gait / Stairs / Wheelchair Mobility  Ambulation/Gait Ambulation/Gait Assistance: 4: Min Environmental consultant (Feet): 7 Feet Assistive device: Rolling walker Ambulation/Gait Assistance Details: Patient needed cues for sequencing steps and RW.   Gait Pattern: Step-to pattern;Decreased stride length Gait velocity: decreased Stairs: No Wheelchair Mobility Wheelchair Mobility: No    Posture / Games developer Sitting - Balance Support: Bilateral upper extremity supported;Feet supported Static Sitting - Level of Assistance: 5: Stand by assistance Static Sitting - Comment/# of Minutes: 1 minute Static Standing Balance Static Standing - Balance Support: Bilateral upper extremity supported;During functional activity Static Standing - Level of Assistance: 5: Stand by assistance Static Standing - Comment/# of Minutes: 1 minute with RW     Previous Home Environment Living Arrangements: Spouse/significant other Lives With: Spouse Available Help at Discharge: Family;Available 24 hours/day Type of Home: House Home Layout: Two level;Bed/bath upstairs;1/2 bath on main level Alternate Level Stairs-Rails:  (to be determined) Alternate Level Stairs-Number of Steps:  12 Home Access: Stairs to enter [employer] of Steps: 1 Bathroom Shower/Tub: Psychologist, counselling;Door Bathroom Toilet: Standard (with 3n1) Bathroom Accessibility: Yes How Accessible: Accessible via walker Home Care Services: No  Discharge Living Setting Plans for Discharge Living Setting: Lives with (comment);Patient's home (spouse) Type of Home at Discharge: House Discharge Home Layout: Two level;Bed/bath upstairs;1/2 bath on main level Alternate Level Stairs-Number of Steps: 12 Discharge Home Access: Stairs to enter Entrance Stairs-Number of Steps: 1 Discharge Bathroom Shower/Tub: Walk-in shower Discharge Bathroom Toilet: Standard Discharge Bathroom Accessibility: Yes How Accessible: Accessible via walker Do you have any problems obtaining your medications?: No  Social/Family/Support Systems Patient Roles: Spouse;Parent (employee) Contact Information: [place] Haggard, spouse Anticipated Caregiver: spouse and family Anticipated Caregiver's Contact Information: cell [phone] Ability/Limitations of Caregiver: works Engineer, structural Availability: Intermittent Discharge Plan Discussed with Primary Caregiver: Yes Is Caregiver In Agreement with Plan?: Yes Does Caregiver/Family have Issues with Lodging/Transportation while Pt is in Rehab?: No  Goals/Additional Needs Patient/Family Goal for Rehab: supervision to Mod I PT, Mod I to supervision OT Expected length of stay: ELOS 7- 10 days Additional Information: nausea  this am. No BM for one week, but that is pt's routine. On IV antiobiotics for 6- 8 weeks. Plan is for await blood culture results, then place PICC. Chemo on hold for now Pt/Family Agrees to Admission and willing to participate: Yes Program Orientation Provided & Reviewed with Pt/Caregiver Including Roles  & Responsibilities: Yes  Patient Condition: This patient's condition remains as documented in the Consult dated 03/07/12, in which the Rehabilitation Physician determined  and documented that the patient's condition is appropriate for intensive rehabilitative care in an inpatient rehabilitation facility.  Preadmission Screen Completed By:  Clois Dupes, 03/08/2012 12:44 PM ______________________________________________________________________   Discussed status with Dr. Wynn Banker on 03/08/12 at 1243 and received telephone approval for admission today.  Admission Coordinator:  Clois Dupes, time 9147 Date 03/08/12.

## 2012-03-08 NOTE — Progress Notes (Signed)
Occupational Therapy Evaluation Patient Details Name: Julie Ross MRN: [id number] DOB: Jan 20, 1961 Today's Date: 03/08/2012 Time: 0865-7846 OT Time Calculation (min): 25 min  OT Assessment / Plan / Recommendation Clinical Impression  Pt. 51 yo female infected left knee with I&D. Would benefit from OT acutely for increased indepenednce with ADL's and education.    OT Assessment  Patient needs continued OT Services    Follow Up Recommendations  Inpatient Rehab    Barriers to Discharge      Equipment Recommendations  Rolling walker with 5" wheels    Recommendations for Other Services    Frequency  Min 2X/week    Precautions / Restrictions Precautions Precautions: Fall Required Braces or Orthoses: Knee Immobilizer - Left Knee Immobilizer - Left: On at all times Restrictions Weight Bearing Restrictions: Yes LLE Weight Bearing: Weight bearing as tolerated   Pertinent Vitals/Pain Pt. Reports pain 5/10 and nauseated feeling    ADL  Grooming: Simulated;Set up Where Assessed - Grooming: Supported sitting Upper Body Bathing: Simulated;Chest;Set up Where Assessed - Upper Body Bathing: Supported sitting Lower Body Bathing: Simulated;Moderate assistance Where Assessed - Lower Body Bathing: Supported sitting Upper Body Dressing: Performed;Min guard Where Assessed - Upper Body Dressing: Supported standing Lower Body Dressing: Performed;Maximal assistance Where Assessed - Lower Body Dressing: Supported sitting Toilet Transfer: Simulated;Moderate assistance Toilet Transfer Method: Sit to stand Toilet Transfer Equipment: Raised toilet seat with arms (or 3-in-1 over toilet) Toileting - Clothing Manipulation and Hygiene: Simulated;Minimal assistance Where Assessed - Toileting Clothing Manipulation and Hygiene: Standing Equipment Used: Gait belt;Rolling walker Transfers/Ambulation Related to ADLs: Pt. able to ambulate ~5 ft from bed to chair using RW. Pt. was mod (A) for sit<>stand due  to generalized weakness. ADL Comments: Pt. feeling very nauseated through out session and reports pain level 5/10. Pt. educated on donning KI and verbalized understanding.     OT Diagnosis: Generalized weakness  OT Problem List: Decreased activity tolerance;Decreased knowledge of precautions;Decreased knowledge of use of DME or AE;Decreased safety awareness OT Treatment Interventions: Self-care/ADL training;DME and/or AE instruction;Patient/family education   OT Goals Acute Rehab OT Goals OT Goal Formulation: With patient Time For Goal Achievement: 03/21/12 Potential to Achieve Goals: Good ADL Goals Pt Will Perform Grooming: with modified independence;Standing at sink;Unsupported ADL Goal: Grooming - Progress: Goal set today Pt Will Perform Lower Body Bathing: with supervision;Sit to stand from chair ADL Goal: Lower Body Bathing - Progress: Goal set today Pt Will Perform Lower Body Dressing: with supervision;Sit to stand from chair ADL Goal: Lower Body Dressing - Progress: Goal set today Pt Will Transfer to Toilet: with modified independence;Ambulation;3-in-1 ADL Goal: Toilet Transfer - Progress: Goal set today Pt Will Perform Toileting - Clothing Manipulation: Independently;Standing ADL Goal: Toileting - Clothing Manipulation - Progress: Goal set today Pt Will Perform Toileting - Hygiene: Independently;Sit to stand from 3-in-1/toilet ADL Goal: Toileting - Hygiene - Progress: Goal set today Pt Will Perform Tub/Shower Transfer: with modified independence;Shower transfer;Ambulation ADL Goal: Web designer - Progress: Goal set today  Visit Information  Last OT Received On: 03/08/12 Assistance Needed: +1    Subjective Data  Subjective: I am very nauseated this morning and don't want to get out of bed Patient Stated Goal: None stated    Prior Functioning  Vision/Perception  Home Living Lives With: Spouse Available Help at Discharge: Family;Available 24 hours/day Type of  Home: House Home Access: Stairs to enter [employer] of Steps: 1 Home Layout: Two level;Bed/bath upstairs;1/2 bath on main level Alternate Level Stairs-Number of Steps: 12  Bathroom Shower/Tub: Psychologist, counselling;Door Bathroom Toilet: Standard (with 3n1) Bathroom Accessibility: Yes How Accessible: Accessible via walker Home Adaptive Equipment: Walker - rolling;Bedside commode/3-in-1 Prior Function Level of Independence: Independent Able to Take Stairs?: Yes Driving: Yes Communication Communication: No difficulties Dominant Hand: Right      Cognition  Overall Cognitive Status: Appears within functional limits for tasks assessed/performed Arousal/Alertness: Awake/alert Orientation Level: Appears intact for tasks assessed Behavior During Session: [facility] for tasks performed    Extremity/Trunk Assessment Right Upper Extremity Assessment RUE ROM/Strength/Tone: [facility] for tasks assessed Left Upper Extremity Assessment LUE ROM/Strength/Tone: [facility] for tasks assessed   Mobility    Bed Mobility Bed Mobility: Supine to Sit;Sitting - Scoot to Edge of Bed Supine to Sit: 4: Min guard;With rails;HOB elevated Sitting - Scoot to Edge of Bed: 4: Min guard Transfers Transfers: Sit to Stand;Stand to Sit Sit to Stand: 3: Mod assist;From bed;With upper extremity assist Stand to Sit: 4: Min guard;With upper extremity assist;With armrests;To chair/3-in-1 Details for Transfer Assistance: Required cues for hand placement for sit <>stand and sequencing reminder while ambulating with RW.                  End of Session OT - End of Session Equipment Utilized During Treatment: Gait belt;Left knee immobilizer Activity Tolerance: Patient tolerated treatment well (Pt. feeling nauseated throughout session) Patient left: in chair;with call bell/phone within reach  GO     Cleora Fleet 03/08/2012, 10:36 AM

## 2012-03-09 ENCOUNTER — Ambulatory Visit: Payer: BC Managed Care – PPO

## 2012-03-09 ENCOUNTER — Ambulatory Visit: Payer: BC Managed Care – PPO | Admitting: Oncology

## 2012-03-09 ENCOUNTER — Inpatient Hospital Stay (HOSPITAL_COMMUNITY): Payer: BC Managed Care – PPO

## 2012-03-09 ENCOUNTER — Inpatient Hospital Stay (HOSPITAL_COMMUNITY): Payer: BC Managed Care – PPO | Admitting: *Deleted

## 2012-03-09 ENCOUNTER — Inpatient Hospital Stay (HOSPITAL_COMMUNITY): Payer: BC Managed Care – PPO | Admitting: Occupational Therapy

## 2012-03-09 ENCOUNTER — Other Ambulatory Visit: Payer: BC Managed Care – PPO | Admitting: Lab

## 2012-03-09 DIAGNOSIS — R5381 Other malaise: Secondary | ICD-10-CM

## 2012-03-09 DIAGNOSIS — Z5189 Encounter for other specified aftercare: Secondary | ICD-10-CM

## 2012-03-09 DIAGNOSIS — M009 Pyogenic arthritis, unspecified: Secondary | ICD-10-CM

## 2012-03-09 LAB — COMPREHENSIVE METABOLIC PANEL
ALT: 11 U/L (ref 0–35)
AST: 18 U/L (ref 0–37)
Albumin: 2.2 g/dL — ABNORMAL LOW (ref 3.5–5.2)
Alkaline Phosphatase: 494 U/L — ABNORMAL HIGH (ref 39–117)
BUN: 29 mg/dL — ABNORMAL HIGH (ref 6–23)
CO2: 21 mEq/L (ref 19–32)
Calcium: 9.3 mg/dL (ref 8.4–10.5)
Chloride: 108 mEq/L (ref 96–112)
Creatinine, Ser: 2.38 mg/dL — ABNORMAL HIGH (ref 0.50–1.10)
GFR calc Af Amer: 26 mL/min — ABNORMAL LOW (ref >90)
GFR calc non Af Amer: 22 mL/min — ABNORMAL LOW (ref >90)
Glucose, Bld: 115 mg/dL — ABNORMAL HIGH (ref 70–99)
Potassium: 3.9 mEq/L (ref 3.5–5.1)
Sodium: 143 mEq/L (ref 135–145)
Total Bilirubin: 0.2 mg/dL — ABNORMAL LOW (ref 0.3–1.2)
Total Protein: 5.8 g/dL — ABNORMAL LOW (ref 6.0–8.3)

## 2012-03-09 LAB — CBC WITH DIFFERENTIAL/PLATELET
Basophils Absolute: 0 10*3/uL (ref 0.0–0.1)
Basophils Relative: 1 % (ref 0–1)
Eosinophils Absolute: 0.1 10*3/uL (ref 0.0–0.7)
Eosinophils Relative: 2 % (ref 0–5)
HCT: 24.7 % — ABNORMAL LOW (ref 36.0–46.0)
Hemoglobin: 8.3 g/dL — ABNORMAL LOW (ref 12.0–15.0)
Lymphocytes Relative: 16 % (ref 12–46)
Lymphs Abs: 0.7 10*3/uL (ref 0.7–4.0)
MCH: 28.1 pg (ref 26.0–34.0)
MCHC: 33.6 g/dL (ref 30.0–36.0)
MCV: 83.7 fL (ref 78.0–100.0)
Monocytes Absolute: 0.3 10*3/uL (ref 0.1–1.0)
Monocytes Relative: 6 % (ref 3–12)
Neutro Abs: 3.1 10*3/uL (ref 1.7–7.7)
Neutrophils Relative %: 75 % (ref 43–77)
Platelets: 334 10*3/uL (ref 150–400)
RBC: 2.95 MIL/uL — ABNORMAL LOW (ref 3.87–5.11)
RDW: 18.8 % — ABNORMAL HIGH (ref 11.5–15.5)
WBC: 4.1 10*3/uL (ref 4.0–10.5)

## 2012-03-09 LAB — PHOSPHORUS: Phosphorus: 4.8 mg/dL — ABNORMAL HIGH (ref 2.3–4.6)

## 2012-03-09 LAB — PROTIME-INR
INR: 2.06 — ABNORMAL HIGH (ref 0.00–1.49)
Prothrombin Time: 22.4 seconds — ABNORMAL HIGH (ref 11.6–15.2)

## 2012-03-09 MED ORDER — LORAZEPAM 2 MG/ML IJ SOLN: 0.5000 mg | Freq: Once | INTRAMUSCULAR | Status: AC

## 2012-03-09 MED ORDER — SODIUM CHLORIDE 0.9 % IJ SOLN
10.0000 mL | INTRAMUSCULAR | Status: DC | PRN
  Administered 2012-03-10 – 2012-03-14 (×3): 20 mL
  Administered 2012-03-14 – 2012-03-16 (×5): 10 mL

## 2012-03-09 MED ORDER — CAMPHOR-MENTHOL 0.5-0.5 % EX LOTN
TOPICAL_LOTION | CUTANEOUS | Status: DC | PRN
  Administered 2012-03-10 – 2012-03-11 (×2): via TOPICAL
  Filled 2012-03-09: qty 222

## 2012-03-09 MED ORDER — HYDROCORTISONE 1 % EX CREA
TOPICAL_CREAM | Freq: Two times a day (BID) | CUTANEOUS | Status: DC
  Administered 2012-03-10 – 2012-03-16 (×11): via TOPICAL
  Filled 2012-03-09 (×3): qty 28

## 2012-03-09 MED ORDER — ENSURE COMPLETE PO LIQD
237.0000 mL | Freq: Two times a day (BID) | ORAL | Status: DC
  Administered 2012-03-09 – 2012-03-15 (×11): 237 mL via ORAL

## 2012-03-09 MED ORDER — WARFARIN SODIUM 5 MG PO TABS
5.0000 mg | ORAL_TABLET | Freq: Once | ORAL | Status: AC
  Administered 2012-03-09: 5 mg via ORAL
  Filled 2012-03-09: qty 1

## 2012-03-09 MED ORDER — LORAZEPAM 0.5 MG PO TABS
0.5000 mg | ORAL_TABLET | Freq: Once | ORAL | Status: AC
  Administered 2012-03-09: 0.5 mg via ORAL
  Filled 2012-03-09: qty 1

## 2012-03-09 NOTE — Plan of Care (Signed)
Overall Plan of Care [facility]) Patient Details Name: Julie Ross MRN: [id number] DOB: [birth year]  Diagnosis:    Primary Diagnosis:    Septic joint of left knee joint Co-morbidities: htn, , MS, hypothyroid  Functional Problem List  Patient demonstrates impairments in the following areas: Balance, Bowel, Edema, Endurance, Motor, Perception, Safety, Sensory  and Skin Integrity  Basic ADL's: grooming, bathing, dressing and toileting Advanced ADL's: simple meal preparation  Transfers:  bed mobility, bed to chair, toilet, tub/shower, car and furniture Locomotion:  ambulation, wheelchair mobility and stairs  Additional Impairments:  None  Anticipated Outcomes Item Anticipated Outcome  Eating/Swallowing    Basic self-care  Mod I  Tolieting  Mod I  Bowel/Bladder  Regular bowel patter/ Continent of bowel and bladder  Transfers  Mod I  Locomotion  S- mod I  Communication    Cognition    Pain  < or = 3  Safety/Judgment    Other     Therapy Plan: PT Frequency: 1-2 X/day, 60-90 minutes OT Frequency: 1-2 X/day, 60-90 minutesELOS- 8-10 days     Team Interventions: Item RN PT OT SLP SW TR Other  Self Care/Advanced ADL Retraining   x      Neuromuscular Re-Education         Therapeutic Activities  x x      UE/LE Strength Training/ROM  x x      UE/LE Coordination Activities         Visual/Perceptual Remediation/Compensation         DME/Adaptive Equipment Instruction  x x      Therapeutic Exercise  x x      Balance/Vestibular Training  x x      Patient/Family Education x x x      Cognitive Remediation/Compensation         Functional Mobility Training  x x      Ambulation/Gait Training  x       Museum/gallery curator  x       Wheelchair Propulsion/Positioning  x       Functional Tourist information centre manager Reintegration  x x      Dysphagia/Aspiration Film/video editor         Bladder Management x        Bowel Management x         Disease Management/Prevention x        Pain Management x x x      Medication Management x        Skin Care/Wound Management x        Splinting/Orthotics         Discharge Planning x  x      Psychosocial Support x                           Team Discharge Planning: Destination:  Home Projected Follow-up:  PT Projected Equipment Needs:  Walker and monitor need for WC Patient/family involved in discharge planning:  Yes  MD ELOS: 7-10 days Medical Rehab Prognosis:  Excellent Assessment: Pt is admitted for CIR therapies. The team will be addressing safety, weight bearing, knee rom, fxnl mobility, ADL's, pain mgt. Goals are mod I.

## 2012-03-09 NOTE — Progress Notes (Signed)
Subjective/Complaints: Tough night. Multiple bm's. Up most of night. Nausea. Pain under fair control A 12 point review of systems has been performed and if not noted above is otherwise negative.   Objective: Vital Signs: Blood pressure 165/82, pulse 77, temperature 97.9 F (36.6 C), temperature source Oral, resp. rate 16, height 6' (1.829 m), weight 100 kg (220 lb 7.4 oz), last menstrual period 12/05/2011, SpO2 93.00%. Dg Abd 1 View  03/08/2012  *RADIOLOGY REPORT*  Clinical Data: Abdominal pain.  Rule out ileus.  ABDOMEN - 1 VIEW  Comparison: None available.  Findings: The bowel gas pattern is unremarkable.  There is no evidence for obstruction or free air.  The axial skeleton is unremarkable.  IMPRESSION: Negative abdomen.   Original Report Authenticated By: Jamesetta Orleans. MATTERN, M.D.     Basename 03/09/12 0535 03/08/12 0643  WBC 4.1 4.0  HGB 8.3* 8.2*  HCT 24.7* 25.0*  PLT 334 282    Basename 03/09/12 0535 03/08/12 0643  NA 143 139  K 3.9 4.1  CL 108 107  CO2 21 20  GLUCOSE 115* 129*  BUN 29* 36*  CREATININE 2.38* 2.82*  CALCIUM 9.3 9.1   CBG (last 3)   Basename 03/07/12 1146 03/06/12 2138 03/06/12 1714  GLUCAP 128* 106* 87    Wt Readings from Last 3 Encounters:  03/08/12 100 kg (220 lb 7.4 oz)  03/02/12 88.451 kg (195 lb)  03/02/12 88.451 kg (195 lb)    Physical Exam:  Constitutional: She is oriented to person, place, and time. She appears well-developed and well-nourished.  HENT:  Head: Normocephalic and atraumatic.  Eyes: Pupils are equal, round, and reactive to light.  Neck: Normal range of motion.  Cardiovascular: Normal rate and regular rhythm.  Pulmonary/Chest: Effort normal and breath sounds normal. She exhibits tenderness (at prior [facility] site. dry dressing noted. ).  Abdominal: Soft. She exhibits no distension. Bowel sounds are decreased. There is no tenderness.  Musculoskeletal: She exhibits edema (bilateral hands.).  Dry dressing left knee with KI in  place. Wound CDI with staples  Neurological: She is alert and oriented to person, place, and time.  Soft spoken. Follows commands without difficulty.  Skin:  Petechial rash on BUE.  Rash on back stable  Psych: affect extremely flat  Assessment/Plan: 1. Functional deficits secondary to Septic L knee s/p poly exchange which require 3+ hours per day of interdisciplinary therapy in a comprehensive inpatient rehab setting. Physiatrist is providing close team supervision and 24 hour management of active medical problems listed below. Physiatrist and rehab team continue to assess barriers to discharge/monitor patient progress toward functional and medical goals. FIM:                                  Medical Problem List and Plan:  1. DVT Prophylaxis/Anticoagulation: Pharmaceutical: Coumadin  2. Pain Management: continue prn tylenol./ultram  3. Mood: appears appropriate. Have LCSW follow for full evaluation.  4. Neuropsych: This patient is capable of making decisions on his/her own behalf.  5. ABLA: monitor with recheck in am. Will avoid iron supplements due to ongoing chronic GI complaints  6. Staph bacteremia: Antibiotic ( vancomycin--->daotomycin/rifampin--->ancef/rifampin) D#6/42  7. Chronic nausea: Likely exacerbated by antibiotics as well as constipation issues. Added probiotic to help with GI symptoms. ID to discontinue rifampin to see if this will help GI symptoms.  8. Constipation: multiple BM's last night. Back off regimen for the time being.Marland Kitchen  9.  AKI: resolving. Continue foley for strict I/O. Continue daily renal panel. Renal status steadily improving. Can probably remove foley soon.  LOS (Days) 1 A FACE TO FACE EVALUATION WAS PERFORMED  SWARTZ,ZACHARY T 03/09/2012, 9:01 AM

## 2012-03-09 NOTE — Evaluation (Signed)
Occupational Therapy Assessment and Plan and Treatment session  Patient Details  Name: Julie Ross MRN: [id number] Date of Birth: Oct 24, 1960  OT Diagnosis: muscle weakness (generalized) Rehab Potential: Rehab Potential: Good ELOS: 8-10 days   Today's Date: 03/09/2012 Time: 0930-1030 Time Calculation (min): 60 min  Problem List:  Patient Active Problem List  Diagnosis  . Osteoarthritis of both knees  . Osteoarthritis of left knee  . Cancer of upper-outer quadrant of female breast  . Multiple sclerosis  . Dehydration  . Seroma, postoperative  . Infected prosthetic knee joint  . Hypokalemia  . Hyponatremia  . Bacteremia  . Septic joint of left knee joint    Past Medical History:  Past Medical History  Diagnosis Date  . Hypertension   . Hypothyroidism   . Arthritis   . Neuromuscular disorder     MS TX WITH CYMBALTA   . Multiple sclerosis   . H/O colonoscopy   . H/O bone density study 03/2011  . Wears glasses   . Heart burn   . Depression   . Sleep apnea     MILD SLEEP APNEA, NO MACHINE  6 YRS AGO [facility]   . Breast cancer     lt breast   Past Surgical History:  Past Surgical History  Procedure Date  . Tonsillectomy   . Tumor removal     FROM PELVIS AGE 51  . Cesarean section     X2   . No past surgeries     BLADDER SLING  4-5 YRS AGO   . Knee arthroscopy     LT KNEE 04/2011  . Total knee arthroplasty 08/15/2011    Procedure: TOTAL KNEE ARTHROPLASTY; lft Surgeon: Harvie Junior, MD;  Location: [facility] OR;  Service: Orthopedics;  Laterality: Left;  RIGHT KNEE CORTIZONE INJECTION  . Mohs surgery   . Knee arthroscopy 84    rt  . Joint replacement Feb 2013  . Mastectomy w/ sentinel node biopsy 12/19/2011    Procedure: MASTECTOMY WITH SENTINEL LYMPH NODE BIOPSY;  Surgeon: Currie Paris, MD;  Location: [facility] OR;  Service: General;  Laterality: Left;  left breast and left axilla   . Portacath placement 01/16/2012    Procedure: INSERTION PORT-A-CATH;  Surgeon:  Currie Paris, MD;  Location: [facility];  Service: General;  Laterality: Right;  Porta Cath Placement   . I&d knee with poly exchange 03/02/2012    Procedure: IRRIGATION AND DEBRIDEMENT KNEE WITH POLY EXCHANGE;  Surgeon: Harvie Junior, MD;  Location: [facility] OR;  Service: Orthopedics;  Laterality: Left;  I&D of total knee with Possible Poly Exchange   . Tee without cardioversion 03/06/2012    Procedure: TRANSESOPHAGEAL ECHOCARDIOGRAM (TEE);  Surgeon: Wendall Stade, MD;  Location: [facility] ENDOSCOPY;  Service: Cardiovascular;  Laterality: N/A;  Rm. [id number]  . Port-a-cath removal 03/06/2012    Procedure: REMOVAL PORT-A-CATH;  Surgeon: Adolph Pollack, MD;  Location[facility] OR;  Service: General;  Laterality: N/A;    Assessment & Plan Clinical Impression: Patient is a 51 y.o. year old female with recent admission to the hospital with HTN, MS, DJD and breast Ca who underwent L TKR in Feb 2013. She was diagnosed with breast Ca in June and started chemotherapy w docetaxel/carboplatin and getting herceptin weekly. She had L mastectomy in [place] bx in July and has been receiving regular chemoRx since. She presented to orthopedic office with painful swollen L knee on 9/13. Aspirate showed WBC's and + gram stain for  staph aureus. BC positive for staph aureus. She was admitted and underwent I&D w poly exchange the same day. Was started on vancomycin for treatment. On 03/04/12, developed acute renal failure with hypoxia, hypotension, poor UOP and AKI due to vancomycin toxicity/ARB. Was started on IVF and medications adjusted per renal input. Changed to IV daptomycin. ID following for input and recommended TEE to rule out endocarditis and addition of rifampin. Port-A-cath removed on 09/16. TEE without evidence of SBE. Acute renal failure resolving and UOP improving.  Renal signed off but will continue to follow creatinine trend. Does not feel patient is ready to have foley removed--continue strict I/O and remove when  appropriate. ID recommends 6 weeks of antibiotics--to place PICC once BC negative. Prior PAC site with small indurated area with tenderness with serosanguinous drainage. Surgery to follow for dressing change and packing of wound. Patient transferred to CIR on 03/08/2012 .    Patient currently requires Total to Min Assist with basic self-care skills secondary to muscle weakness.  Prior to hospitalization, patient could complete BADL and IADL with Modified Independence when needed..  Patient will benefit from skilled intervention to increase independence with basic self-care skills prior to discharge home with care partner.  Anticipate patient will require intermittent supervision and follow up outpatient.  OT - End of Session Endurance Deficit: Yes Endurance Deficit Description: up a lot last night moving bowels OT Assessment Rehab Potential: Good Barriers to Discharge: Inaccessible home environment Barriers to Discharge Comments: bedroom on 2nd level OT Plan OT Frequency: 1-2 X/day, 60-90 minutes Estimated Length of Stay: 8-10 days OT Treatment/Interventions: Balance/vestibular training;Discharge planning;Community reintegration;DME/adaptive equipment instruction;Self Care/advanced ADL retraining;Therapeutic Exercise;Therapeutic Activities;Functional mobility training;Patient/family education;UE/LE Strength taining/ROM OT Recommendation Follow Up Recommendations:  (TBD) Equipment Recommended: Other (comment) (Hip kit)  OT Evaluation Precautions/Restrictions  Precautions Precautions: Fall Precaution Comments: NO KNEE ROM L  Required Braces or Orthoses: Knee Immobilizer - Left Knee Immobilizer - Left: On at all times Restrictions Weight Bearing Restrictions: Yes LLE Weight Bearing: Weight bearing as tolerated Pain Pain Assessment Pain Assessment: No/denies pain Pain Score:   5 Faces Pain Scale: Hurts a little bit Pain Type: Acute pain Pain Location: Knee Pain Orientation:  Left Pain Descriptors: Aching Pain Intervention(s): RN made aware Home Living/Prior Functioning Home Living Lives With: Spouse Available Help at Discharge: Family Type of Home: House Home Access: Stairs to enter Secretary/administrator of Steps: 1 Entrance Stairs-Rails: None Home Layout: Two level;1/2 bath on main level Alternate Level Stairs-Number of Steps: 12 Alternate Level Stairs-Rails: Right Bathroom Shower/Tub: Walk-in shower;Door [employer] Toilet: Standard Bathroom Accessibility: Yes How Accessible: Accessible via walker Home Adaptive Equipment: Bedside commode/3-in-1;Shower chair without back;Hand-held shower hose Prior Function Able to Take Stairs?: Yes Driving: Yes 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 ADL ADL Eating: Not assessed Grooming: Setup Where Assessed-Grooming: Sitting at sink Upper Body Bathing: Supervision/safety Where Assessed-Upper Body Bathing: Sitting at sink Lower Body Bathing: Maximal assistance Where Assessed-Lower Body Bathing: Sitting at sink Upper Body Dressing: Minimal assistance Where Assessed-Upper Body Dressing: Sitting at sink Lower Body Dressing: Maximal assistance Where Assessed-Lower Body Dressing: Standing at sink;Sitting at sink Toileting: Moderate assistance Where Assessed-Toileting: Teacher, adult education: Maximal Dentist Method: Art gallery manager: Not assessed Vision/Perception  Vision - History Baseline Vision: Wears glasses all the time Patient Visual Report: No change from baseline Perception Perception: Within Functional Limits Praxis Praxis: Intact  Cognition Overall Cognitive Status: Appears within functional limits for tasks assessed Arousal/Alertness: Awake/alert Orientation Level: Oriented X4 Sensation Sensation  Light Touch: Appears Intact Hot/Cold: Appears Intact Proprioception: Appears Intact Coordination Gross Motor Movements are Fluid and  Coordinated: Yes Fine Motor Movements are Fluid and Coordinated: Yes Motor  Motor Motor: Within Functional Limits Mobility  Transfers Sit to Stand: 4: Min assist;From chair/3-in-1;With upper extremity assist;With armrests Stand to Sit: 4: Min assist;To chair/3-in-1;With upper extremity assist;With armrests  Trunk/Postural Assessment  Cervical Assessment Cervical Assessment: Within Functional Limits Thoracic Assessment Thoracic Assessment: Within Functional Limits Lumbar Assessment Lumbar Assessment: Within Functional Limits  Balance Static Sitting Balance Static Sitting - Balance Support: No upper extremity supported Static Sitting - Level of Assistance: 7: Independent Static Standing Balance Static Standing - Balance Support: No upper extremity supported Static Standing - Level of Assistance: 4: Min assist Dynamic Standing Balance Dynamic Standing - Level of Assistance: 3: Mod assist;4: Min assist (without UE support on pivot) Extremity/Trunk Assessment RUE Assessment RUE Assessment: Exceptions to [facility] RUE AROM (degrees) RUE Overall AROM Comments: WFL in all ranges. RUE Strength RUE Overall Strength Comments: MMT: 4-/5 LUE Assessment LUE Assessment: Exceptions to WFL LUE AROM (degrees) LUE Overall AROM Comments: WFL in all ranges. LUE Strength LUE Overall Strength Comments: MMT: 4-/5  See FIM for current functional status Refer to Care Plan for Long Term Goals  Recommendations for other services: None  Discharge Criteria: Patient will be discharged from OT if patient refuses treatment 3 consecutive times without medical reason, if treatment goals not met, if there is a change in medical status, if patient makes no progress towards goals or if patient is discharged from hospital.  The above assessment, treatment plan, treatment alternatives and goals were discussed and mutually agreed upon: by patient  Skilled Therapeutic Interventions/Progress Updates:  Patient  reports dizziness and pain in left knee during ADL. Recommended patient use hip kit AE to increase functional performance during bathing and dressing. Patient is to wear knee immobilizer at all times. WBAT LLE. Educated patient on functional transfers, bathing and dressing techniques, donning/doffing knee immobilizer. Patient spouse arrived at end of treatment session.  Limmie Patricia, OTR/L 03/09/2012, 11:35 AM

## 2012-03-09 NOTE — Progress Notes (Signed)
Occupational Therapy Session Note  Patient Details  Name: Julie Ross MRN: [id number] Date of Birth: March 14, 1961  Today's Date: 03/09/2012 Time: 5784-6962 Time Calculation (min): 30 min  Short Term Goals: Week 1:  OT Short Term Goal 1 (Week 1): Patient will perform LB dressing with Min Assist with AE. OT Short Term Goal 2 (Week 1): Patient will perform LB bathing with Min Assist with AE. OT Short Term Goal 3 (Week 1): Patient will perform toileting with Supervision. OT Short Term Goal 4 (Week 1): Patient will perform toilet transfer with Supervision. OT Short Term Goal 5 (Week 1): Patient will increase Bil UE strength and endurance to increase functional performance during sit to stands.  Skilled Therapeutic Interventions/Progress Updates:    self care retraining: session focused on introduction and training with AE for LB dressing, including reacher and sock-aid due to L knee immobilization brace. Pt demonstrated understanding and will continue training in therapy.  Therapy Documentation Precautions:  Precautions Precautions: Fall Precaution Comments: NO KNEE ROM L  Required Braces or Orthoses: Knee Immobilizer - Left Knee Immobilizer - Left: On at all times Restrictions Weight Bearing Restrictions: Yes LLE Weight Bearing: Weight bearing as tolerated  Pain: No pain this PM   See FIM for current functional status  Therapy/Group: Individual Therapy  Jackelyn Poling 03/09/2012, 4:06 PM

## 2012-03-09 NOTE — Progress Notes (Signed)
Subjective:     Patient reports pain as 2 on 0-10 scale.   No C/o's. Pt seems to be in good spirits. Up in chair.  Objective: Vital signs in last 24 hours: Temp:  [97.5 F (36.4 C)-97.9 F (36.6 C)] 97.5 F (36.4 C) (09/20 1548) Pulse Rate:  [67-77] 67  (09/20 1548) Resp:  [16-20] 20  (09/20 1548) BP: (165-167)/(69-82) 167/69 mmHg (09/20 1548) SpO2:  [93 %-99 %] 99 % (09/20 1548)  Intake/Output from previous day: 09/19 0701 - 09/20 0700 In: 240 [P.O.:240] Out: 2101 [Urine:2100; Stool:1] Intake/Output this shift: Total I/O In: 640 [P.O.:640] Out: 401 [Urine:400; Stool:1]   Basename 03/09/12 0535 03/08/12 0643  HGB 8.3* 8.2*    Basename 03/09/12 0535 03/08/12 0643  WBC 4.1 4.0  RBC 2.95* 2.98*  HCT 24.7* 25.0*  PLT 334 282    Basename 03/09/12 0535 03/08/12 0643  NA 143 139  K 3.9 4.1  CL 108 107  CO2 21 20  BUN 29* 36*  CREATININE 2.38* 2.82*  GLUCOSE 115* 129*  CALCIUM 9.3 9.1    Basename 03/09/12 0535 03/08/12 0643  LABPT -- --  INR 2.06* 1.77*  Left knee in knee immobilizer. N-V intact to foot. No pain with ankle rom.    Assessment/Plan:    7 days s/p I&D left total knee, revision of polyethylene component with extensive synovectomy.Septic left knee. Plan: Cont IV antibiotics per ID. May WBAT on Left leg with knee immobilizer on when up. May remove in bed. Cont rehab therapies  Therapists can do left knee rom exercises as tolerated. Instruct pt on SLR's also. Cont coumadin. Dr Karie Chimera PA-C on call over weekend. Call them if needed.Office [phone].  Julie Ross 03/09/2012, 6:53 PM

## 2012-03-09 NOTE — H&P (Signed)
Physical Medicine and Rehabilitation Admission H&P    CC: Deconditioning due to bacteremia.   HPI: Julie Ross is a 51 y.o. female HTN, MS,  DJD and breast Ca who underwent L TKR in Feb 2013. She was diagnosed with breast Ca in June and started chemotherapy w docetaxel/carboplatin and getting herceptin weekly. She had L mastectomy in [place] bx in July and has been receiving regular chemoRx since. She presented to orthopedic office with painful swollen L knee on 9/13. Aspirate showed WBC's and + gram stain for staph aureus. BC positive for staph aureus. She was admitted and underwent I&D w poly exchange the same day. Was started on vancomycin for treatment. On 03/04/12, developed acute renal failure with hypoxia, hypotension, poor UOP and AKI due to vancomycin toxicity/ARB.  Was started on IVF and medications adjusted per renal input. Changed to IV daptomycin. ID following for input and recommended TEE to rule out endocarditis and addition of rifampin. Port-A-cath removed on 09/16. TEE without evidence of SBE. Acute renal failure resolving and UOP improving.   Renal signed off but will continue to follow creatinine trend.  Does not feel patient is ready to have foley removed--continue strict I/O and remove when appropriate. ID recommends 6 weeks of antibiotics--to place PICC once BC negative. Prior PAC site with small indurated area with tenderness with serosanguinous drainage.  Surgery to follow for dressing change and packing of wound. Patient is noted to be deconditioned with impairments in mobility and self care therapies initiated and therapy team recommending CIR for progression.   Review of Systems  Eyes: Positive for blurred vision (on and off).  Respiratory: Negative for shortness of breath.   Cardiovascular: Positive for chest pain (right chest wall pain at prior [facility] site).  Gastrointestinal: Positive for nausea (regurgitation of liquids/solids) and constipation (no BM for a week. ).    Musculoskeletal: Positive for joint pain (left knee).  Neurological: Positive for weakness and headaches (frontal ).  Psychiatric/Behavioral: Positive for memory loss (problems with memory).   Past Medical History  Diagnosis Date  . Hypertension   . Hypothyroidism   . Arthritis   . Neuromuscular disorder     MS TX WITH CYMBALTA   . Multiple sclerosis   . H/O colonoscopy   . H/O bone density study 03/2011  . Wears glasses   . Heart burn   . Depression   . Sleep apnea     MILD SLEEP APNEA, NO MACHINE  6 YRS AGO [facility]PT [facility]   . Breast cancer     lt breast   Past Surgical History  Procedure Date  . Tonsillectomy   . Tumor removal     FROM PELVIS AGE 55  . Cesarean section     X2   . No past surgeries     BLADDER SLING  4-5 YRS AGO   . Knee arthroscopy     LT KNEE 04/2011  . Total knee arthroplasty 08/15/2011    Procedure: TOTAL KNEE ARTHROPLASTY; lft Surgeon: Harvie Junior, MD;  Location: [facility] OR;  Service: Orthopedics;  Laterality: Left;  RIGHT KNEE CORTIZONE INJECTION  . Mohs surgery   . Knee arthroscopy 82    rt  . Joint replacement Feb 2013  . Mastectomy w/ sentinel node biopsy 12/19/2011    Procedure: MASTECTOMY WITH SENTINEL LYMPH NODE BIOPSY;  Surgeon: Currie Paris, MD;  Location: [facility] OR;  Service: General;  Laterality: Left;  left breast and left axilla   . Portacath placement 01/16/2012  Procedure: INSERTION PORT-A-CATH;  Surgeon: Currie Paris, MD;  Location: [facility];  Service: General;  Laterality: Right;  Porta Cath Placement   . I&d knee with poly exchange 03/02/2012    Procedure: IRRIGATION AND DEBRIDEMENT KNEE WITH POLY EXCHANGE;  Surgeon: Harvie Junior, MD;  Location: [facility] OR;  Service: Orthopedics;  Laterality: Left;  I&D of total knee with Possible Poly Exchange   . Tee without cardioversion 03/06/2012    Procedure: TRANSESOPHAGEAL ECHOCARDIOGRAM (TEE);  Surgeon: Wendall Stade, MD;  Location: [facility] ENDOSCOPY;  Service: Cardiovascular;   Laterality: N/A;  Rm. [id number]  . Port-a-cath removal 03/06/2012    Procedure: REMOVAL PORT-A-CATH;  Surgeon: Adolph Pollack, MD;  Location: [facility] OR;  Service: General;  Laterality: N/A;   Family History  Problem Relation Age of Onset  . Breast cancer Paternal Aunt   . Breast cancer Maternal Grandmother   . Heart disease Maternal Grandfather    Social History: Married. Works as an Oncologist. She reports that she has never smoked. She has never used smokeless tobacco. She reports that she does not drink alcohol or use illicit drugs. Husband supportive and plans on taking a week off to assist past discharge.   Allergies: No Known Allergies  Scheduled Meds:    .  ceFAZolin (ANCEF) IV  1 g Intravenous Q12H  . enoxaparin  40 mg Subcutaneous Q24H  . ondansetron  4 mg Oral QID   Or  . ondansetron (ZOFRAN) IV  4 mg Intravenous QID  . pantoprazole  80 mg Oral Q1200  . senna-docusate  2 tablet Oral QHS  . sodium phosphate  1 enema Rectal Once  . warfarin  7.5 mg Oral ONCE-1800  . Warfarin - Pharmacist Dosing Inpatient   Does not apply q1800  . DISCONTD: enoxaparin (LOVENOX) injection  30 mg Subcutaneous Q24H    Medications Prior to Admission  Medication Sig Dispense Refill  . baclofen (LIORESAL) 10 MG tablet Take 10 mg by mouth 3 (three) times daily.      Marland Kitchen CALCIUM-MAGNESIUM PO Take 1 tablet by mouth daily.      . clonazePAM (KLONOPIN) 0.5 MG tablet Take 0.5 mg by mouth at bedtime as needed. For sleep      . docusate sodium (COLACE) 100 MG capsule Take 1 capsule (100 mg total) by mouth 2 (two) times daily.  10 capsule  0  . DULoxetine (CYMBALTA) 60 MG capsule Take 60 mg by mouth daily.      Marland Kitchen esomeprazole (NEXIUM) 40 MG capsule Take 40 mg by mouth daily before breakfast.      . gabapentin (NEURONTIN) 600 MG tablet Take 600 mg by mouth 3 (three) times daily.      Marland Kitchen HYDROcodone-acetaminophen (NORCO) 10-325 MG per tablet Take 1 tablet by mouth every 8 (eight) hours as needed. As  needed for pain.      . Multiple Vitamins-Minerals (MULTIVITAMINS THER. W/MINERALS) TABS Take 1 tablet by mouth daily.      . prochlorperazine (COMPAZINE) 10 MG tablet Take 1 tablet (10 mg total) by mouth every 6 (six) hours as needed (Nausea or vomiting).  30 tablet  1  . prochlorperazine (COMPAZINE) 25 MG suppository Place 1 suppository (25 mg total) rectally every 12 (twelve) hours as needed for nausea.  12 suppository  3    Home:     Functional History:    Functional Status:  Mobility:          ADL:  Cognition: Cognition Orientation Level: Oriented X4     Blood pressure 165/82, pulse 77, temperature 97.9 F (36.6 C), temperature source Oral, resp. rate 16, height 6' (1.829 m), weight 100 kg (220 lb 7.4 oz), last menstrual period 12/05/2011, SpO2 93.00%. Physical Exam  Nursing note and vitals reviewed. Constitutional: She is oriented to person, place, and time. She appears well-developed and well-nourished.  HENT:  Head: Normocephalic and atraumatic.  Eyes: Pupils are equal, round, and reactive to light.  Neck: Normal range of motion.  Cardiovascular: Normal rate and regular rhythm.   Pulmonary/Chest: Effort normal and breath sounds normal. She exhibits tenderness (at prior [facility] site. dry dressing noted. ).  Abdominal: Soft. She exhibits no distension. Bowel sounds are decreased. There is no tenderness.  Musculoskeletal: She exhibits edema (bilateral hands.).       Dry dressing left knee with KI in place.   Neurological: She is alert and oriented to person, place, and time.       Soft spoken.  Follows commands without difficulty.   Skin:       Petechial rash on BUE.  Upper back with heat rash.     CULTURE, BLOOD (ROUTINE X 2)     Status: Normal (Preliminary result)   Collection Time   03/07/12 12:30 PM      Component Value Range Comment   Specimen Description BLOOD RIGHT HAND      Special Requests BOTTLES DRAWN AEROBIC ONLY 8CC      Culture  Setup Time  03/07/2012 20:02      Culture        Value:        BLOOD CULTURE RECEIVED NO GROWTH TO DATE CULTURE WILL BE HELD FOR 5 DAYS BEFORE ISSUING A FINAL NEGATIVE REPORT   Report Status PENDING     CULTURE, BLOOD (ROUTINE X 2)     Status: Normal (Preliminary result)   Collection Time   03/07/12 12:40 PM      Component Value Range Comment   Specimen Description BLOOD RIGHT HAND      Special Requests BOTTLES DRAWN AEROBIC ONLY 6CC      Culture  Setup Time 03/07/2012 20:01      Culture        Value:        BLOOD CULTURE RECEIVED NO GROWTH TO DATE CULTURE WILL BE HELD FOR 5 DAYS BEFORE ISSUING A FINAL NEGATIVE REPORT   Report Status PENDING     PROTIME-INR     Status: Abnormal   Collection Time   03/08/12  6:43 AM      Component Value Range Comment   Prothrombin Time 20.0 (*) 11.6 - 15.2 seconds    INR 1.77 (*) 0.00 - 1.49   RENAL FUNCTION PANEL     Status: Abnormal   Collection Time   03/08/12  6:43 AM      Component Value Range Comment   Sodium 139  135 - 145 mEq/L    Potassium 4.1  3.5 - 5.1 mEq/L    Chloride 107  96 - 112 mEq/L    CO2 20  19 - 32 mEq/L    Glucose, Bld 129 (*) 70 - 99 mg/dL    BUN 36 (*) 6 - 23 mg/dL    Creatinine, Ser 0.98 (*) 0.50 - 1.10 mg/dL    Calcium 9.1  8.4 - 11.9 mg/dL    Phosphorus 4.9 (*) 2.3 - 4.6 mg/dL    Albumin 2.1 (*) 3.5 - 5.2 g/dL  GFR calc non Af Amer 18 (*) >90 mL/min    GFR calc Af Amer 21 (*) >90 mL/min   CBC     Status: Abnormal   Collection Time   03/08/12  6:43 AM      Component Value Range Comment   WBC 4.0  4.0 - 10.5 K/uL    RBC 2.98 (*) 3.87 - 5.11 MIL/uL    Hemoglobin 8.2 (*) 12.0 - 15.0 g/dL    HCT 16.1 (*) 09.6 - 46.0 %    MCV 83.9  78.0 - 100.0 fL    MCH 27.5  26.0 - 34.0 pg    MCHC 32.8  30.0 - 36.0 g/dL    RDW 04.5 (*) 40.9 - 15.5 %    Platelets 282  150 - 400 K/uL    Dg Abd 1 View  03/08/2012  *RADIOLOGY REPORT*  Clinical Data: Abdominal pain.  Rule out ileus.  ABDOMEN - 1 VIEW  Comparison: None available.  Findings: The  bowel gas pattern is unremarkable.  There is no evidence for obstruction or free air.  The axial skeleton is unremarkable.  IMPRESSION: Negative abdomen.   Original Report Authenticated By: Jamesetta Orleans. MATTERN, M.D.     Post Admission Physician Evaluation: 1. Functional deficits secondary  to Septic L knee s/p poly exchange. 2. Patient is admitted to receive collaborative, interdisciplinary care between the physiatrist, rehab nursing staff, and therapy team. 3. Patient's level of medical complexity and substantial therapy needs in context of that medical necessity cannot be provided at a lesser intensity of care such as a SNF. 4. Patient has experienced substantial functional loss from his/her baseline which was documented above under the "Functional History" and "Functional Status" headings.  Judging by the patient's diagnosis, physical exam, and functional history, the patient has potential for functional progress which will result in measurable gains while on inpatient rehab.  These gains will be of substantial and practical use upon discharge  in facilitating mobility and self-care at the household level. 5. Physiatrist will provide 24 hour management of medical needs as well as oversight of the therapy plan/treatment and provide guidance as appropriate regarding the interaction of the two. 6. 24 hour rehab nursing will assist with bladder management, bowel management, safety, skin/wound care, disease management, medication administration, pain management and patient education  and help integrate therapy concepts, techniques,education, etc. 7. PT will assess and treat for:  Pre gait, gait, equipment , safety,endurance.  Goals are: Sup mobility. 8. OT will assess and treat for: ADL,cog/percept, safety ,endurance,equipment.   Goals are: Sup LE ADL,Mod I UE ADL. 9. SLP will assess and treat for: NA.  Goals are: NA. 10. Case Management and Social Worker will assess and treat for psychological issues  and discharge planning. 11. Team conference will be held weekly to assess progress toward goals and to determine barriers to discharge. 12. Patient will receive at least 3 hours of therapy per day at least 5 days per week. 13. ELOS: 7-10days      Prognosis:  good   Medical Problem List and Plan: 1. DVT Prophylaxis/Anticoagulation: Pharmaceutical: Coumadin 2. Pain Management: continue prn tylenol./ultram 3. Mood: appears appropriate.  Have LCSW follow for full evaluation.  4. Neuropsych: This patient is capable of making decisions on his/her own behalf. 5. ABLA: monitor with recheck in am. Will avoid iron supplements due to ongoing chronic GI complaints 6. Staph bacteremia:  Antibiotic ( vancomycin--->daotomycin/rifampin--->ancef/rifampin) D#6/42 7. Chronic nausea:  Likely exacerbated by antibiotics as well as  constipation issues. Add probiotic to help with GI symptoms. ID to discontinue rifampin to see if this will help GI symptoms. 8. Constipation: check KUB to rule out ileus. Will set bowel program. Enema today.  9. AKI: resolving.  Continue foley for strict I/O. Continue daily renal panel. Renal status slowly improving.  I saw the pt on 03/08/12 at 530pm 03/09/2012, 6:59 AM

## 2012-03-09 NOTE — Progress Notes (Signed)
Afebrile.  Right chest wall wound with no erythema.  Wicks pulled out some more.  Will continue to slowly pull wicks out.  Sutures can be removed in 14 days.

## 2012-03-09 NOTE — Evaluation (Signed)
Physical Therapy Assessment and Plan  Patient Details  Name: Julie Ross MRN: [id number] Date of Birth: 11-Jul-1960  PT Diagnosis: Difficulty walking, Muscle weakness and Pain in joint Rehab Potential: Good ELOS: 7-10 days 7-10 days  Today's Date: 03/09/2012 Time: 0930-1030 Time Calculation (min): 60 min  Problem List:  Patient Active Problem List  Diagnosis  . Osteoarthritis of both knees  . Osteoarthritis of left knee  . Cancer of upper-outer quadrant of female breast  . Multiple sclerosis  . Dehydration  . Seroma, postoperative  . Infected prosthetic knee joint  . Hypokalemia  . Hyponatremia  . Bacteremia  . Septic joint of left knee joint    Past Medical History:  Past Medical History  Diagnosis Date  . Hypertension   . Hypothyroidism   . Arthritis   . Neuromuscular disorder     MS TX WITH CYMBALTA   . Multiple sclerosis   . H/O colonoscopy   . H/O bone density study 03/2011  . Wears glasses   . Heart burn   . Depression   . Sleep apnea     MILD SLEEP APNEA, NO MACHINE  6 YRS AGO HPT [facility]   . Breast cancer     lt breast   Past Surgical History:  Past Surgical History  Procedure Date  . Tonsillectomy   . Tumor removal     FROM PELVIS AGE 36  . Cesarean section     X2   . No past surgeries     BLADDER SLING  4-5 YRS AGO   . Knee arthroscopy     LT KNEE 04/2011  . Total knee arthroplasty 08/15/2011    Procedure: TOTAL KNEE ARTHROPLASTY; lft Surgeon: Harvie Junior, MD;  Location: [facility] OR;  Service: Orthopedics;  Laterality: Left;  RIGHT KNEE CORTIZONE INJECTION  . Mohs surgery   . Knee arthroscopy 82    rt  . Joint replacement Feb 2013  . Mastectomy w/ sentinel node biopsy 12/19/2011    Procedure: MASTECTOMY WITH SENTINEL LYMPH NODE BIOPSY;  Surgeon: Currie Paris, MD;  Location: [facility] OR;  Service: General;  Laterality: Left;  left breast and left axilla   . Portacath placement 01/16/2012    Procedure: INSERTION PORT-A-CATH;  Surgeon: Currie Paris, MD;  Location: [facility];  Service: General;  Laterality: Right;  Porta Cath Placement   . I&d knee with poly exchange 03/02/2012    Procedure: IRRIGATION AND DEBRIDEMENT KNEE WITH POLY EXCHANGE;  Surgeon: Harvie Junior, MD;  Location: [facility] OR;  Service: Orthopedics;  Laterality: Left;  I&D of total knee with Possible Poly Exchange   . Tee without cardioversion 03/06/2012    Procedure: TRANSESOPHAGEAL ECHOCARDIOGRAM (TEE);  Surgeon: Wendall Stade, MD;  Location: [facility] ENDOSCOPY;  Service: Cardiovascular;  Laterality: N/A;  Rm. [phone]  . Port-a-cath removal 03/06/2012    Procedure: REMOVAL PORT-A-CATH;  Surgeon: Adolph Pollack, MD;  Location: [facility] OR;  Service: General;  Laterality: N/A;    Assessment & Plan Clinical Impression: Julie Ross is a 51 y.o. female HTN, MS,  DJD and breast Ca who underwent L TKR in Feb 2013. She was diagnosed with breast Ca in June and started chemotherapy w docetaxel/carboplatin and getting herceptin weekly. She had L mastectomy in [place] in July and has been receiving regular chemoRx since. She presented to orthopedic office with painful swollen L knee on 9/13. Aspirate showed WBC's and + gram stain for staph aureus. BC positive for  staph aureus. She was admitted and underwent I&D w poly exchange the same day. Was started on vancomycin for treatment. On 03/04/12, developed acute renal failure with hypoxia, hypotension, poor UOP and AKI due to vancomycin toxicity/ARB.  Was started on IVF and medications adjusted per renal input. Changed to IV daptomycin. ID following for input and recommended TEE to rule out endocarditis and addition of rifampin. Port-A-cath removed on 09/16. TEE without evidence of SBE. Acute renal failure resolving and UOP improving.   Renal signed off but will continue to follow creatinine trend.  Does not feel patient is ready to have foley removed--continue strict I/O and remove when appropriate. ID recommends 6 weeks of antibiotics--to  place PICC once BC negative. Prior PAC site with small indurated area with tenderness with serosanguinous drainage.  Surgery to follow for dressing change and packing of wound. Patient is noted to be deconditioned with impairments in mobility and self care therapies initiated and therapy team recommending CIR for progression.  Pt takes baclofen for spasticity in LE's related to MS.    Patient transferred to CIR on 03/08/2012 .   Patient currently requires min-mod with mobility secondary to muscle weakness and L LE in KI at all times and decreased standing balance.  Prior to hospitalization, patient was I with mobility and lived with Spouse in a House  With 2 levels.  Home access is 1Stairs to enter.  Patient will benefit from skilled PT intervention to maximize safe functional mobility, minimize fall risk and decrease caregiver burden for planned discharge home with 24 hour supervision.  Anticipate patient will HH vs OP TBD at discharge.  PT - End of Session Activity Tolerance: Tolerates 10 - 20 min activity with multiple rests Endurance Deficit: Yes Endurance Deficit Description: up a lot last night moving bowels PT Assessment Rehab Potential: Good PT Plan PT Frequency: 1-2 X/day, 60-90 minutes Estimated Length of Stay: 7-10 days PT Treatment/Interventions: Ambulation/gait training;Community reintegration;DME/adaptive equipment instruction;UE/LE Strength taining/ROM;Wheelchair propulsion/positioning;Pain management;Patient/family Counselling psychologist;Therapeutic Activities;Therapeutic Exercise;Functional mobility training;Balance/vestibular training;Discharge planning PT Recommendation Follow Up Recommendations:  (TBD by PT) Equipment Recommended: Other (comment) (Hip kit) Equipment Details: monitor need for w/c, other RW was borrowed and pt would like her own  PT Evaluation Precautions/Restrictions Precautions Precautions: Fall Precaution Comments: NO KNEE ROM L  Required Braces or  Orthoses: Knee Immobilizer - Left Knee Immobilizer - Left: On at all times Restrictions Weight Bearing Restrictions: Yes LLE Weight Bearing: Weight bearing as tolerated    Pain Pain Assessment Pain Assessment: No/denies pain Pain Score:   5 Pain Type: Acute pain Pain Location: Knee Pain Orientation: Left Pain Descriptors: Aching Pain Intervention(s): RN made aware Home Living/Prior Functioning Home Living Lives With: Spouse Available Help at Discharge: Family Type of Home: House Home Access: Stairs to enter Secretary/administrator of Steps: 1 Entrance Stairs-Rails: None Home Layout: Two level;1/2 bath on main level Alternate Level Stairs-Number of Steps: 12 Alternate Level Stairs-Rails: Right Bathroom Shower/Tub: Walk-in shower;Door [employer] Toilet: Standard Bathroom Accessibility: Yes How Accessible: Accessible via walker Home Adaptive Equipment: Bedside commode/3-in-1;Shower chair without back;Hand-held shower hose Prior Function Able to Take Stairs?: Yes Driving: Yes 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 Vision/Perception  Vision - History Baseline Vision: Wears glasses all the time Patient Visual Report: No change from baseline Perception Perception: Within Functional Limits Praxis Praxis: Intact  Cognition Overall Cognitive Status: Appears within functional limits for tasks assessed Arousal/Alertness: Awake/alert Orientation Level: Oriented X4 Sensation Sensation Light Touch: Appears Intact Hot/Cold: Appears Intact Proprioception: Appears Intact  Coordination Gross Motor Movements are Fluid and Coordinated: Yes Fine Motor Movements are Fluid and Coordinated: Yes Motor  Motor Motor: Within Functional Limits  Mobility Transfers Sit to Stand: 4: Min assist;From chair/3-in-1;With upper extremity assist;With armrests Stand to Sit: 4: Min assist;To chair/3-in-1;With upper extremity assist;With armrests Stand Pivot Transfers: 3:  Mod assist;With armrests Locomotion     Trunk/Postural Assessment  Cervical Assessment Cervical Assessment: Within Functional Limits Thoracic Assessment Thoracic Assessment: Within Functional Limits Lumbar Assessment Lumbar Assessment: Within Functional Limits  Balance Static Sitting Balance Static Sitting - Balance Support: No upper extremity supported Static Sitting - Level of Assistance: 7: Independent Static Standing Balance Static Standing - Balance Support: No upper extremity supported Static Standing - Level of Assistance: 4: Min assist Dynamic Standing Balance Dynamic Standing - Level of Assistance: 3: Mod assist;4: Min assist (without UE support on pivot) Extremity Assessment  RUE Assessment RUE Assessment: Exceptions to [facility] RUE AROM (degrees) RUE Overall AROM Comments: WFL in all ranges. RUE Strength RUE Overall Strength Comments: MMT: 4-/5 LUE Assessment LUE Assessment: Exceptions to WFL LUE AROM (degrees) LUE Overall AROM Comments: WFL in all ranges. LUE Strength LUE Overall Strength Comments: MMT: 4-/5 RLE Assessment RLE Assessment: Within Functional Limits LLE Assessment LLE Assessment: Exceptions to [facility] LLE Strength LLE Overall Strength Comments: knee NT in KI,  hip 2-,2/5, ankle swollen >3/5 LLE Tone LLE Tone:  (NT in hip/knee, ankle WFL)  See FIM for current functional status Refer to Care Plan for Long Term Goals  Recommendations for other services: None  Discharge Criteria: Patient will be discharged from PT if patient refuses treatment 3 consecutive times without medical reason, if treatment goals not met, if there is a change in medical status, if patient makes no progress towards goals or if patient is discharged from hospital.  The above assessment, treatment plan, treatment alternatives and goals were discussed and mutually agreed upon: by patient   Michaelene Song 03/09/2012, 12:00 PM

## 2012-03-09 NOTE — Progress Notes (Signed)
This note has been reviewed and this clinician agrees with information provided.  

## 2012-03-09 NOTE — Progress Notes (Signed)
Physical Therapy Session Note  Patient Details  Name: Julie Ross MRN: [id number] Date of Birth: 10-08-1960  Today's Date: 03/09/2012 Time: 2130-8657 Time Calculation (min): 30 min  Skilled Therapeutic Interventions/Progress Updates:    Treatment focused on functional transfers, sit to stands, and dynamic gait through obstacle course negotiating turns, side stepping, and stepping over objects with steady A overall for all tasks. Pt nauseous throughout session but willing to work. Rest breaks and ginger ale as needed.  Therapy Documentation Precautions:  Precautions Precautions: Fall Precaution Comments: NO KNEE ROM L  Required Braces or Orthoses: Knee Immobilizer - Left Knee Immobilizer - Left: On at all times Restrictions Weight Bearing Restrictions: Yes LLE Weight Bearing: Weight bearing as tolerated   Pain: No pain, c/o nausea. RN notified.  See FIM for current functional status  Therapy/Group: Individual Therapy  [name] [facility] 03/09/2012, 4:03 PM

## 2012-03-09 NOTE — Progress Notes (Signed)
ANTICOAGULATION CONSULT NOTE - Follow Up Consult  Pharmacy Consult for Coumadin Indication: VTE prophylaxis  No Known Allergies  Patient Measurements: Height: 6' (182.9 cm) Weight: 220 lb 7.4 oz (100 kg) IBW/kg (Calculated) : 73.1    Vital Signs: Temp: 97.9 F (36.6 C) (09/20 0603) Temp src: Oral (09/20 0603) BP: 165/82 mmHg (09/20 0603) Pulse Rate: 77  (09/20 0603)  Labs:  Basename 03/09/12 0535 03/08/12 0643 03/07/12 0600  HGB 8.3* 8.2* --  HCT 24.7* 25.0* --  PLT 334 282 --  APTT -- -- --  LABPROT 22.4* 20.0* 16.8*  INR 2.06* 1.77* 1.40  HEPARINUNFRC -- -- --  CREATININE 2.38* 2.82* 3.29*  CKTOTAL -- -- --  CKMB -- -- --  TROPONINI -- -- --    Estimated Creatinine Clearance: 36.2 ml/min (by C-G formula based on Cr of 2.38).   Medications:  Scheduled:     .  ceFAZolin (ANCEF) IV  1 g Intravenous Q12H  . enoxaparin  40 mg Subcutaneous Q24H  . ondansetron  4 mg Oral QID   Or  . ondansetron (ZOFRAN) IV  4 mg Intravenous QID  . pantoprazole  80 mg Oral Q1200  . sodium phosphate  1 enema Rectal Once  . warfarin  7.5 mg Oral ONCE-1800  . Warfarin - Pharmacist Dosing Inpatient   Does not apply q1800  . DISCONTD: enoxaparin (LOVENOX) injection  30 mg Subcutaneous Q24H  . DISCONTD: senna-docusate  2 tablet Oral QHS    Assessment: Patient transferred to rehab unit this evening.  51yo female, s/p I&D of knee for septic arthritis on Coumadin for VTE prophylaxis for 4 weeks (started 9/13). Coumadin was on hold for possible HD needs but SCr is improving as well as UOP.  Patient was noted to be  on rifampin which likely increases coumadin dosing requirements. However, ID discontinued the rifampin today.  INR = 2.06 which is therapeutic.   Goal of Therapy:  INR 2-3 Monitor platelets by anticoagulation protocol: Yes   Plan:  -Coumadin 5mg  x 1 today.  -Daily PT/INR -Stop lovenox.   Celedonio Miyamoto, PharmD, BCPS Clinical Pharmacist Pager [phone]   03/09/2012 10:07  AM

## 2012-03-09 NOTE — Progress Notes (Signed)
INFECTIOUS DISEASE PROGRESS NOTE  ID: Julie Ross is a 51 y.o. female with   Principal Problem:  *Septic joint of left knee joint Active Problems:  Multiple sclerosis  Bacteremia  Subjective: C/o nausea, zofran x 2 this am.   Abtx:  Anti-infectives     Start     Dose/Rate Route Frequency Ordered Stop   03/08/12 2100   ceFAZolin (ANCEF) IVPB 1 g/50 mL premix        1 g 100 mL/hr over 30 Minutes Intravenous Every 12 hours 03/08/12 1846            Medications:  Scheduled:   .  ceFAZolin (ANCEF) IV  1 g Intravenous Q12H  . feeding supplement  237 mL Oral BID BM  . ondansetron  4 mg Oral QID   Or  . ondansetron (ZOFRAN) IV  4 mg Intravenous QID  . pantoprazole  80 mg Oral Q1200  . sodium phosphate  1 enema Rectal Once  . warfarin  5 mg Oral ONCE-1800  . warfarin  7.5 mg Oral ONCE-1800  . Warfarin - Pharmacist Dosing Inpatient   Does not apply q1800  . DISCONTD: enoxaparin (LOVENOX) injection  30 mg Subcutaneous Q24H  . DISCONTD: enoxaparin  40 mg Subcutaneous Q24H  . DISCONTD: senna-docusate  2 tablet Oral QHS    Objective: Vital signs in last 24 hours: Temp:  [97.9 F (36.6 C)-98.2 F (36.8 C)] 97.9 F (36.6 C) (09/20 0603) Pulse Rate:  [71-77] 77  (09/20 0603) Resp:  [16-20] 16  (09/20 0603) BP: (162-178)/(69-82) 165/82 mmHg (09/20 0603) SpO2:  [93 %-97 %] 93 % (09/20 0603) Weight:  [100 kg (220 lb 7.4 oz)] 100 kg (220 lb 7.4 oz) (09/19 1800)   General appearance: alert, cooperative and no distress Resp: clear to auscultation bilaterally Chest wall: R chest wound is dressed, tender Cardio: regular rate and rhythm GI: normal findings: bowel sounds normal and soft, non-tender Extremities: RUE peripheral IV is streaking. LLE is wrapped.   Lab Results  Basename 03/09/12 0535 03/08/12 0643  WBC 4.1 4.0  HGB 8.3* 8.2*  HCT 24.7* 25.0*  NA 143 139  K 3.9 4.1  CL 108 107  CO2 21 20  BUN 29* 36*  CREATININE 2.38* 2.82*  GLU -- --   Liver  Panel  Basename 03/09/12 0535 03/08/12 0643  PROT 5.8* --  ALBUMIN 2.2* 2.1*  AST 18 --  ALT 11 --  ALKPHOS 494* --  BILITOT 0.2* --  BILIDIR -- --  IBILI -- --   Sedimentation Rate No results found for this basename: ESRSEDRATE in the last 72 hours C-Reactive Protein No results found for this basename: CRP:2 in the last 72 hours  Microbiology: Recent Results (from the past 240 hour(s))  SURGICAL PCR SCREEN     Status: Abnormal   Collection Time   03/02/12  1:35 PM      Component Value Range Status Comment   MRSA, PCR NEGATIVE  NEGATIVE Final    Staphylococcus aureus POSITIVE (*) NEGATIVE Final   ANAEROBIC CULTURE     Status: Normal   Collection Time   03/02/12  4:20 PM      Component Value Range Status Comment   Specimen Description SYNOVIAL FLUID KNEE LEFT   Final    Special Requests RECEIVED ON SWAB   Final    Gram Stain     Final    Value: MODERATE WBC PRESENT, PREDOMINANTLY PMN     RARE GRAM POSITIVE  COCCI     IN PAIRS Gram Stain Report Called to,Read Back By and Verified With: Gram Stain Report Called to,Read Back By and Verified With: DR GRAVES AT 1652 03/02/12 BY ROMEROJ Performed at [facility]   Culture NO ANAEROBES ISOLATED   Final    Report Status 03/07/2012 FINAL   Final   BODY FLUID CULTURE     Status: Normal   Collection Time   03/02/12  4:20 PM      Component Value Range Status Comment   Specimen Description SYNOVIAL FLUID KNEE LEFT   Final    Special Requests RECEIVED ON SWAB   Final    Gram Stain     Final    Value: MODERATE WBC PRESENT, PREDOMINANTLY PMN     RARE GRAM POSITIVE COCCI     IN PAIRS Gram Stain Report Called to,Read Back By and Verified With: Gram Stain Report Called to,Read Back By and Verified With: DR GRAVES AT 1652 03/02/12 BY ROMEROJ Performed at [facility]   Culture     Final    Value: MODERATE STAPHYLOCOCCUS AUREUS     Note: RIFAMPIN AND GENTAMICIN SHOULD NOT BE USED AS SINGLE DRUGS FOR TREATMENT OF STAPH INFECTIONS.    Report Status 03/04/2012 FINAL   Final    Organism ID, Bacteria STAPHYLOCOCCUS AUREUS   Final   GRAM STAIN     Status: Normal   Collection Time   03/02/12  4:20 PM      Component Value Range Status Comment   Specimen Description SYNOVIAL FLUID KNEE LEFT   Final    Special Requests NONE   Final    Gram Stain     Final    Value: MODERATE WBC PRESENT, PREDOMINANTLY PMN     RARE GRAM POSITIVE COCCI IN PAIRS     CALLED TO DR. Luiz Blare 9.13.13 AT 1652 BY ROMEROJ   Report Status 03/02/2012 FINAL   Final   CULTURE, BLOOD (ROUTINE X 2)     Status: Normal   Collection Time   03/03/12 12:25 AM      Component Value Range Status Comment   Specimen Description BLOOD RIGHT HAND   Final    Special Requests BOTTLES DRAWN AEROBIC AND ANAEROBIC 5CC EACH   Final    Culture  Setup Time 03/03/2012 11:01   Final    Culture     Final    Value: STAPHYLOCOCCUS AUREUS     Note: RIFAMPIN AND GENTAMICIN SHOULD NOT BE USED AS SINGLE DRUGS FOR TREATMENT OF STAPH INFECTIONS. This organism DOES NOT demonstrate inducible Clindamycin resistance in vitro.     Note: Gram Stain Report Called to,Read Back By and Verified With: ERIN ARNOLD 03/04/12 @ 11:30AM BY RUSCA.   Report Status 03/06/2012 FINAL   Final    Organism ID, Bacteria STAPHYLOCOCCUS AUREUS   Final   CULTURE, BLOOD (ROUTINE X 2)     Status: Normal   Collection Time   03/03/12 12:40 AM      Component Value Range Status Comment   Specimen Description BLOOD RIGHT HAND   Final    Special Requests BOTTLES DRAWN AEROBIC AND ANAEROBIC 3CC EACH   Final    Culture  Setup Time 03/03/2012 11:01   Final    Culture     Final    Value: STAPHYLOCOCCUS AUREUS     Note: SUSCEPTIBILITIES PERFORMED ON PREVIOUS CULTURE WITHIN THE LAST 5 DAYS.     Note: Gram Stain Report Called to,Read Back By  and Verified With: ERIN ARNOLD 03/04/12 @ 11:30AM BY RUSCA.   Report Status 03/06/2012 FINAL   Final   URINE CULTURE     Status: Normal   Collection Time   03/04/12  3:50 AM       Component Value Range Status Comment   Specimen Description URINE, CATHETERIZED   Final    Special Requests PATIENT ON FOLLOWING VANCOMYCIN   Final    Culture  Setup Time 03/04/2012 11:16   Final    Colony Count NO GROWTH   Final    Culture NO GROWTH   Final    Report Status 03/05/2012 FINAL   Final   CULTURE, BLOOD (ROUTINE X 2)     Status: Normal (Preliminary result)   Collection Time   03/07/12 12:30 PM      Component Value Range Status Comment   Specimen Description BLOOD RIGHT HAND   Final    Special Requests BOTTLES DRAWN AEROBIC ONLY 8CC   Final    Culture  Setup Time 03/07/2012 20:02   Final    Culture     Final    Value:        BLOOD CULTURE RECEIVED NO GROWTH TO DATE CULTURE WILL BE HELD FOR 5 DAYS BEFORE ISSUING A FINAL NEGATIVE REPORT   Report Status PENDING   Incomplete   CULTURE, BLOOD (ROUTINE X 2)     Status: Normal (Preliminary result)   Collection Time   03/07/12 12:40 PM      Component Value Range Status Comment   Specimen Description BLOOD RIGHT HAND   Final    Special Requests BOTTLES DRAWN AEROBIC ONLY [facility]   Final    Culture  Setup Time 03/07/2012 20:01   Final    Culture     Final    Value:        BLOOD CULTURE RECEIVED NO GROWTH TO DATE CULTURE WILL BE HELD FOR 5 DAYS BEFORE ISSUING A FINAL NEGATIVE REPORT   Report Status PENDING   Incomplete     Studies/Results: Dg Abd 1 View  03/08/2012  *RADIOLOGY REPORT*  Clinical Data: Abdominal pain.  Rule out ileus.  ABDOMEN - 1 VIEW  Comparison: None available.  Findings: The bowel gas pattern is unremarkable.  There is no evidence for obstruction or free air.  The axial skeleton is unremarkable.  IMPRESSION: Negative abdomen.   Original Report Authenticated By: Jamesetta Orleans. MATTERN, M.D.      Assessment/Plan: Septic Arthritis/Osteomyelitis (staph aureus)  L TKR  Staph aureus (MSSA) bacteremia 2/2   TEE (-)  ARF, resolving  Breast Cancer (L) T1 N1 (stage II)  MS  Constipation  Total days of antibiotics 8  (vancomycin --> cubicin/rifampin--> ancef/rifampin--> ancef)  Would-  Work up her nausea-   KUB (-)  So far unchanged with stopping rifampin. Not sure it is related to her anbx..  Watch Cr, continues to improve  Recheck BCx ngtd 9-18, for clearance of her staph  Will need long term anbx (~ 6 weeks) as well as chemo. Could place [facility], then transition to a new port.   Available if questions over w/e   Johny Sax Infectious Diseases [phone] 03/09/2012, 11:44 AM   LOS: 1 day

## 2012-03-09 NOTE — Progress Notes (Signed)
Patient information reviewed and entered into [employer] system by Chana Bode, RN-BC, CRRN,  covering PPS Coordinator.  Information including coding and functional independence measure will be reviewed and updated through discharge.

## 2012-03-09 NOTE — Progress Notes (Signed)
INITIAL ADULT NUTRITION ASSESSMENT Date: 03/09/2012   Time: 9:26 AM  Reason for Assessment: MD Consult for Poor PO Intake  INTERVENTION: 1. RD provided handout "Managing Nausea and Vomiting" and "Nutrition During and After Cancer Treatment" from [employer]. Reviewed information with patient. 2. Ensure Complete po BID, each supplement provides 350 kcal and 13 grams of protein. 3. RD to continue to follow nutrition care plan  DOCUMENTATION CODES Per approved criteria  -Not Applicable   ASSESSMENT: Female 51 y.o.  Dx: Septic joint of left knee joint  Hx:  Past Medical History  Diagnosis Date  . Hypertension   . Hypothyroidism   . Arthritis   . Neuromuscular disorder     MS TX WITH CYMBALTA   . Multiple sclerosis   . H/O colonoscopy   . H/O bone density study 03/2011  . Wears glasses   . Heart burn   . Depression   . Sleep apnea     MILD SLEEP APNEA, NO MACHINE  6 YRS AGO [facility]   . Breast cancer     lt breast   Past Surgical History  Procedure Date  . Tonsillectomy   . Tumor removal     FROM PELVIS AGE 51  . Cesarean section     X2   . No past surgeries     BLADDER SLING  4-5 YRS AGO   . Knee arthroscopy     LT KNEE 04/2011  . Total knee arthroplasty 08/15/2011    Procedure: TOTAL KNEE ARTHROPLASTY; lft Surgeon: Harvie Junior, MD;  Location: [facility] OR;  Service: Orthopedics;  Laterality: Left;  RIGHT KNEE CORTIZONE INJECTION  . Mohs surgery   . Knee arthroscopy 82    rt  . Joint replacement Feb 2013  . Mastectomy w/ sentinel node biopsy 12/19/2011    Procedure: MASTECTOMY WITH SENTINEL LYMPH NODE BIOPSY;  Surgeon: Currie Paris, MD;  Location: [facility] OR;  Service: General;  Laterality: Left;  left breast and left axilla   . Portacath placement 01/16/2012    Procedure: INSERTION PORT-A-CATH;  Surgeon: Currie Paris, MD;  Location: [facility];  Service: General;  Laterality: Right;  Porta Cath Placement   . I&d knee  with poly exchange 03/02/2012    Procedure: IRRIGATION AND DEBRIDEMENT KNEE WITH POLY EXCHANGE;  Surgeon: Harvie Junior, MD;  Location: [facility] OR;  Service: Orthopedics;  Laterality: Left;  I&D of total knee with Possible Poly Exchange   . Tee without cardioversion 03/06/2012    Procedure: TRANSESOPHAGEAL ECHOCARDIOGRAM (TEE);  Surgeon: Wendall Stade, MD;  [facility] END[facility]COPY;  Service: Cardiovascular;  Laterality: N/A;  Rm. [id number]  . Port-a-cath removal 03/06/2012    Procedure: REMOVAL PORT-A-CATH;  Surgeon: Adolph Pollack, MD;  Location: [facility] OR;  Service: General;  Laterality: N/A;   Related Meds:     .  ceFAZolin (ANCEF) IV  1 g Intravenous Q12H  . enoxaparin  40 mg Subcutaneous Q24H  . ondansetron  4 mg Oral QID   Or  . ondansetron (ZOFRAN) IV  4 mg Intravenous QID  . pantoprazole  80 mg Oral Q1200  . sodium phosphate  1 enema Rectal Once  . warfarin  7.5 mg Oral ONCE-1800  . Warfarin - Pharmacist Dosing Inpatient   Does not apply q1800  . DISCONTD: enoxaparin (LOVENOX) injection  30 mg Subcutaneous Q24H  . DISCONTD: senna-docusate  2 tablet Oral QHS   Ht: 6' (182.9 cm)  Wt: 220 lb  7.4 oz (100 kg)  Ideal Wt: 72.7 kg % Ideal Wt: 138%  Wt Readings from Last 15 Encounters:  03/08/12 220 lb 7.4 oz (100 kg)  03/02/12 195 lb (88.451 kg)  03/02/12 195 lb (88.451 kg)  03/02/12 195 lb (88.451 kg)  03/02/12 195 lb (88.451 kg)  02/24/12 198 lb 1.6 oz (89.858 kg)  02/22/12 199 lb (90.266 kg)  02/16/12 202 lb 9.6 oz (91.899 kg)  02/10/12 199 lb 6.4 oz (90.447 kg)  02/06/12 201 lb 12.8 oz (91.536 kg)  02/03/12 197 lb 6.4 oz (89.54 kg)  01/31/12 200 lb 2 oz (90.776 kg)  01/27/12 199 lb 11.2 oz (90.583 kg)  01/27/12 199 lb (90.266 kg)  01/24/12 201 lb (91.173 kg)  Usual Wt: 195 - 200 lb % Usual Wt: 110%  Body mass index is 29.90 kg/(m^2). Overweight  Food/Nutrition Related Hx: hx of recently starting chemotherapy  Labs:  CMP     Component Value Date/Time   NA 143 03/09/2012  0535   NA 136 02/24/2012 1049   K 3.9 03/09/2012 0535   K 3.7 02/24/2012 1049   CL 108 03/09/2012 0535   CL 98 02/24/2012 1049   CO2 21 03/09/2012 0535   CO2 27 02/24/2012 1049   GLUCOSE 115* 03/09/2012 0535   GLUCOSE 99 02/24/2012 1049   BUN 29* 03/09/2012 0535   BUN 13.0 02/24/2012 1049   CREATININE 2.38* 03/09/2012 0535   CREATININE 0.8 02/24/2012 1049   CALCIUM 9.3 03/09/2012 0535   CALCIUM 9.4 02/24/2012 1049   PROT 5.8* 03/09/2012 0535   PROT 6.5 02/24/2012 1049   ALBUMIN 2.2* 03/09/2012 0535   ALBUMIN 3.8 02/24/2012 1049   AST 18 03/09/2012 0535   AST 35* 02/24/2012 1049   ALT 11 03/09/2012 0535   ALT 68* 02/24/2012 1049   ALKPHOS 494* 03/09/2012 0535   ALKPHOS 187* 02/24/2012 1049   BILITOT 0.2* 03/09/2012 0535   BILITOT 1.00 02/24/2012 1049   GFRNONAA 22* 03/09/2012 0535   GFRAA 26* 03/09/2012 0535   Phosphorus  Date/Time Value Range Status  03/09/2012  5:35 AM 4.8* 2.3 - 4.6 mg/dL Final   No magnesium available.   Intake/Output Summary (Last 24 hours) at 03/09/12 0955 Last data filed at 03/09/12 1610  Gross per 24 hour  Intake    240 ml  Output   2101 ml  Net  -1861 ml  BM: 9/20  Diet Order: General  Supplements/Tube Feeding: none  IVF:    Estimated Nutritional Needs:   Kcal: 2100 - 2300 kcal Protein: 110 - 120 grams Fluid: 2.1 - 2.3 liters daily  Pt dx with breast cancer in June. Per chart, pt has started chemotherapy with docetaxel/carboplatin and getting herceptin weekly. She had L mastectomy in [place] bx in July and has been receiving regular chemoRx since.  Pt with chronic nausea - per MD, likely exacerbated by abx and constipation issues. MD added probiotic. BM this morning.  RN reports pt is getting scheduled zofran for chronic nausea. Pt reports that her nausea started with chemotherapy and that she has yet to see an RD. Provided handouts and discussed nausea strategies, pt is agreeable to trying Ensure supplements to help with oral intake.  Pt is at nutrition risk given catabolic  illness and poor PO intake.  NUTRITION DIAGNOSIS: Inadequate oral intake r/t chronic nausea AEB pt report of poor PO intake.  MONITORING/EVALUATION(Goals): Goal: Pt to meet >/= 90% of their estimated nutrition needs Monitor: weight trends, lab trends, I/O's, PO intake,  supplement tolerance  EDUCATION NEEDS: -Education needs addressed  Jarold Motto MS, RD, LDN Pager: [phone] After-hours pager: [phone]

## 2012-03-09 NOTE — Progress Notes (Signed)
Physical Therapy Session Note  Patient Details  Name: Julie Ross MRN: [id number] Date of Birth: 1961/03/14  Today's Date: 03/09/2012 Time: 4098-1191 Time Calculation (min): 32 min  Short Term Goals: Week 1:   see LTG (S-modI)  Skilled Therapeutic Interventions/Progress Updates:   Therapeutic activity- husband present, both in agreement with goals and ELOS, RW instruction on proper use with household gait; transfer training mod A to lift and cues to extend LLE out  Therapy Documentation Precautions:  Precautions Precautions: Fall Precaution Comments: NO KNEE ROM L  Required Braces or Orthoses: Knee Immobilizer - Left Knee Immobilizer - Left: On at all times Restrictions Weight Bearing Restrictions: Yes LLE Weight Bearing: Weight bearing as tolerated Pain:   no c/o pain but taking meds for nauseau again  See FIM for current functional status  Therapy/Group: Individual Therapy  Michaelene Song 03/09/2012, 12:05 PM

## 2012-03-10 ENCOUNTER — Ambulatory Visit: Payer: BC Managed Care – PPO

## 2012-03-10 ENCOUNTER — Inpatient Hospital Stay (HOSPITAL_COMMUNITY): Payer: BC Managed Care – PPO | Admitting: Physical Therapy

## 2012-03-10 DIAGNOSIS — Z5189 Encounter for other specified aftercare: Secondary | ICD-10-CM

## 2012-03-10 DIAGNOSIS — M009 Pyogenic arthritis, unspecified: Secondary | ICD-10-CM

## 2012-03-10 DIAGNOSIS — R5381 Other malaise: Secondary | ICD-10-CM

## 2012-03-10 LAB — RENAL FUNCTION PANEL
Albumin: 2.5 g/dL — ABNORMAL LOW (ref 3.5–5.2)
BUN: 23 mg/dL (ref 6–23)
CO2: 24 mEq/L (ref 19–32)
Calcium: 9.3 mg/dL (ref 8.4–10.5)
Chloride: 102 mEq/L (ref 96–112)
Creatinine, Ser: 2.03 mg/dL — ABNORMAL HIGH (ref 0.50–1.10)
GFR calc Af Amer: 31 mL/min — ABNORMAL LOW (ref >90)
GFR calc non Af Amer: 27 mL/min — ABNORMAL LOW (ref >90)
Glucose, Bld: 114 mg/dL — ABNORMAL HIGH (ref 70–99)
Phosphorus: 5.6 mg/dL — ABNORMAL HIGH (ref 2.3–4.6)
Potassium: 3.6 mEq/L (ref 3.5–5.1)
Sodium: 136 mEq/L (ref 135–145)

## 2012-03-10 LAB — PROTIME-INR
INR: 2.24 — ABNORMAL HIGH (ref 0.00–1.49)
Prothrombin Time: 23.8 seconds — ABNORMAL HIGH (ref 11.6–15.2)

## 2012-03-10 MED ORDER — MUSCLE RUB 10-15 % EX CREA
TOPICAL_CREAM | CUTANEOUS | Status: DC | PRN
  Administered 2012-03-10 – 2012-03-11 (×3): via TOPICAL
  Filled 2012-03-10: qty 85

## 2012-03-10 MED ORDER — BACITRACIN ZINC 500 UNIT/GM EX OINT
TOPICAL_OINTMENT | Freq: Two times a day (BID) | CUTANEOUS | Status: DC
  Administered 2012-03-10 – 2012-03-16 (×12): via TOPICAL
  Filled 2012-03-10 (×2): qty 15

## 2012-03-10 MED ORDER — ALPRAZOLAM 0.25 MG PO TABS
0.5000 mg | ORAL_TABLET | Freq: Three times a day (TID) | ORAL | Status: DC | PRN
  Administered 2012-03-10 – 2012-03-16 (×8): 0.5 mg via ORAL
  Filled 2012-03-10 (×3): qty 2
  Filled 2012-03-10: qty 1
  Filled 2012-03-10 (×3): qty 2
  Filled 2012-03-10: qty 1
  Filled 2012-03-10: qty 2

## 2012-03-10 MED ORDER — WARFARIN SODIUM 5 MG PO TABS
5.0000 mg | ORAL_TABLET | Freq: Once | ORAL | Status: AC
  Administered 2012-03-10: 5 mg via ORAL
  Filled 2012-03-10: qty 1

## 2012-03-10 MED ORDER — CEFAZOLIN SODIUM 1-5 GM-% IV SOLN
1.0000 g | Freq: Three times a day (TID) | INTRAVENOUS | Status: DC
  Administered 2012-03-10 – 2012-03-16 (×16): 1 g via INTRAVENOUS
  Filled 2012-03-10 (×19): qty 50

## 2012-03-10 NOTE — Progress Notes (Signed)
I spoke to patient. She was in bathroom.  Agree with evaluation and treatment plan outlined by Mr. Golda Acre.  Wicks out. Pain better  Will check wound tomorrow.  Angelia Mould. Derrell Lolling, M.D., [facility] General and Minimally invasive Surgery Breast and Colorectal Surgery Office:   [phone] Pager:   [phone]

## 2012-03-10 NOTE — Progress Notes (Signed)
Patient ID: Julie Ross, female   DOB: 12-29-1960, 51 y.o.   MRN: [id number]    Subjective: C/o of some itching , PICC line placed yesterday in RUE.  Objective: Vital signs in last 24 hours: Temp:  [97.5 F (36.4 C)-97.7 F (36.5 C)] 97.7 F (36.5 C) (09/21 0458) Pulse Rate:  [65-82] 68  (09/21 1050) Resp:  [20] 20  (09/21 0458) BP: (167-218)/(69-103) 180/78 mmHg (09/21 1050) SpO2:  [93 %-99 %] 96 % (09/21 1050) Last BM Date: 03/09/12  Intake/Output from previous day: 09/20 0701 - 09/21 0700 In: 1000 [P.O.:1000] Out: 2052 [Urine:2050; Stool:2] Intake/Output this shift:    General appearance: alert, cooperative, appears stated age and no distress Porta cath removal site: no drainage on dressing, removed remaining wick.small indurated area along the superior border of the incision, site tender. 2x2 dry dressing and bacitracin ointment to incision site.  afebrile and HD stable. Sutures should be okay to take out on 9/24 or 03/16/12. If wound is healed.  Lab Results:   [facility] 03/09/12 0535 03/08/12 0643  WBC 4.1 4.0  HGB 8.3* 8.2*  HCT 24.7* 25.0*  PLT 334 282   BMET  Basename 03/10/12 0930 03/09/12 0535  NA 136 143  K 3.6 3.9  CL 102 108  CO2 24 21  GLUCOSE 114* 115*  BUN 23 29*  CREATININE 2.03* 2.38*  CALCIUM 9.3 9.3   PT/INR  Basename 03/10/12 0945 03/09/12 0535  LABPROT 23.8* 22.4*  INR 2.24* 2.06*   ABG No results found for this basename: PHART:2,PCO2:2,PO2:2,HCO3:2 in the last 72 hours  Studies/Results: Dg Abd 1 View  03/08/2012  *RADIOLOGY REPORT*  Clinical Data: Abdominal pain.  Rule out ileus.  ABDOMEN - 1 VIEW  Comparison: None available.  Findings: The bowel gas pattern is unremarkable.  There is no evidence for obstruction or free air.  The axial skeleton is unremarkable.  IMPRESSION: Negative abdomen.   Original Report Authenticated By: Jamesetta Orleans. MATTERN, M.D.    Dg Chest Port 1 View  03/09/2012  *RADIOLOGY REPORT*  Clinical Data: Right  PICC placement  PORTABLE CHEST - 1 VIEW  Comparison: 03/04/2012  Findings: Right PICC terminates in the upper right atrium, 2 cm below the cavoatrial junction.  Mild interstitial edema. No pleural effusion or pneumothorax.  The heart is top normal in size/mildly enlarged.  IMPRESSION: Right PICC terminates in the upper right atrium, 2 cm below the cavoatrial junction.  Borderline cardiomegaly with mild interstitial edema.   Original Report Authenticated By: Charline Bills, M.D.     Anti-infectives: Anti-infectives     Start     Dose/Rate Route Frequency Ordered Stop   03/08/12 2100   ceFAZolin (ANCEF) IVPB 1 g/50 mL premix        1 g 100 mL/hr over 30 Minutes Intravenous Every 12 hours 03/08/12 1846            Assessment/Plan:  Patient Active Problem List  Diagnosis  . Osteoarthritis of both knees  . Osteoarthritis of left knee  . Cancer of upper-outer quadrant of female breast  . Multiple sclerosis  . Dehydration  . Seroma, postoperative  . Infected prosthetic knee joint  . Hypokalemia  . Hyponatremia  . Bacteremia  . Septic joint of left knee joint   s/p * No surgery found * Sutures should be able to be removed from old porta cath site on either 9/24 or 03/16/12 Recommend dry dressing and perhaps some abx ointment only.  Watch for s/s./ induration elevated  wbc, drainage, or other s/s infectious process. Management of her other medical problems per medicine team.    LOS: 2 days    Jacaria Colburn 03/10/2012

## 2012-03-10 NOTE — Progress Notes (Signed)
Patient ID: Julie Ross, female   DOB: 1961/01/15, 51 y.o.   MRN: [id number] Subjective/Complaints: Chest discomfort started in bed, no exercise related symptoms yesterday, did WC propulsion No SOB, No diaphoresis, no L arm pain A 12 point review of systems has been performed and if not noted above is otherwise negative.   Objective: Vital Signs: Blood pressure 179/84, pulse 77, temperature 97.7 F (36.5 C), temperature source Oral, resp. rate 20, height 6' (1.829 m), weight 100 kg (220 lb 7.4 oz), last menstrual period 12/05/2011, SpO2 93.00%. Dg Abd 1 View  03/08/2012  *RADIOLOGY REPORT*  Clinical Data: Abdominal pain.  Rule out ileus.  ABDOMEN - 1 VIEW  Comparison: None available.  Findings: The bowel gas pattern is unremarkable.  There is no evidence for obstruction or free air.  The axial skeleton is unremarkable.  IMPRESSION: Negative abdomen.   Original Report Authenticated By: Jamesetta Orleans. MATTERN, M.D.    Dg Chest Port 1 View  03/09/2012  *RADIOLOGY REPORT*  Clinical Data: Right PICC placement  PORTABLE CHEST - 1 VIEW  Comparison: 03/04/2012  Findings: Right PICC terminates in the upper right atrium, 2 cm below the cavoatrial junction.  Mild interstitial edema. No pleural effusion or pneumothorax.  The heart is top normal in size/mildly enlarged.  IMPRESSION: Right PICC terminates in the upper right atrium, 2 cm below the cavoatrial junction.  Borderline cardiomegaly with mild interstitial edema.   Original Report Authenticated By: Charline Bills, M.D.     Basename 03/09/12 0535 03/08/12 0643  WBC 4.1 4.0  HGB 8.3* 8.2*  HCT 24.7* 25.0*  PLT 334 282    Basename 03/09/12 0535 03/08/12 0643  NA 143 139  K 3.9 4.1  CL 108 107  CO2 21 20  GLUCOSE 115* 129*  BUN 29* 36*  CREATININE 2.38* 2.82*  CALCIUM 9.3 9.1   CBG (last 3)   Basename 03/07/12 1146  GLUCAP 128*    Wt Readings from Last 3 Encounters:  03/08/12 100 kg (220 lb 7.4 oz)  03/02/12 88.451 kg (195 lb)    03/02/12 88.451 kg (195 lb)    Physical Exam:  Constitutional: She is oriented to person, place, and time. She appears well-developed and well-nourished.  HENT:  Head: Normocephalic and atraumatic.  Eyes: Pupils are equal, round, and reactive to light.  Neck: Normal range of motion.  Cardiovascular: Normal rate and regular rhythm.  Pulmonary/Chest: Effort normal and breath sounds normal. She exhibits tenderness (at prior [facility] site. dry dressing noted. ). Left side pectoralis origen tender to palpation Abdominal: Soft. She exhibits no distension. Bowel sounds are decreased. There is no tenderness.  Musculoskeletal: She exhibits edema (bilateral hands.).  Dry dressing left knee with KI in place. Wound CDI with staples  Neurological: She is alert and oriented to person, place, and time.  Soft spoken. Follows commands without difficulty.  Skin:  Petechial rash on BUE.  Rash on back stable  Psych: affect extremely flat, mild anxiety  Assessment/Plan: 1. Functional deficits secondary to Septic L knee s/p poly exchange which require 3+ hours per day of interdisciplinary therapy in a comprehensive inpatient rehab setting. Physiatrist is providing close team supervision and 24 hour management of active medical problems listed below. Physiatrist and rehab team continue to assess barriers to discharge/monitor patient progress toward functional and medical goals. FIM: FIM - Bathing Bathing Steps Patient Completed: Chest;Right Arm;Left Arm;Abdomen;Front perineal area;Buttocks;Right upper leg;Left upper leg;Right lower leg (including foot);Left lower leg (including foot) Bathing: 3: Mod-Patient completes 5-7  28f 10 parts or 50-74%  FIM - Upper Body Dressing/Undressing Upper body dressing/undressing steps patient completed: Thread/unthread right sleeve of pullover shirt/dresss;Thread/unthread left sleeve of pullover shirt/dress;Put head through opening of pull over shirt/dress;Pull shirt over  trunk Upper body dressing/undressing: 4: Min-Patient completed 75 plus % of tasks FIM - Lower Body Dressing/Undressing Lower body dressing/undressing steps patient completed: Thread/unthread right underwear leg;Thread/unthread left underwear leg;Pull underwear up/down;Thread/unthread right pants leg;Thread/unthread left pants leg;Pull pants up/down;Don/Doff right sock;Don/Doff left sock Lower body dressing/undressing: 1: Total-Patient completed less than 25% of tasks  FIM - Toileting Toileting steps completed by patient: Performs perineal hygiene Toileting: 2: Max-Patient completed 1 of 3 steps  FIM - Toilet Transfers Toilet Transfers: 3-To toilet/BSC: Mod A (lift or lower assist);3-From toilet/BSC: Mod A (lift or lower assist)  FIM - Bed/Chair Transfer Bed/Chair Transfer Assistive Devices: Arm rests Bed/Chair Transfer: 4: Supine > Sit: Min A (steadying Pt. > 75%/lift 1 leg);4: Sit > Supine: Min A (steadying pt. > 75%/lift 1 leg);3: Bed > Chair or W/C: Mod A (lift or lower assist);3: Chair or W/C > Bed: Mod A (lift or lower assist)  FIM - Locomotion: Wheelchair Locomotion: Wheelchair: 2: Travels 50 - 149 ft with supervision, cueing or coaxing FIM - Locomotion: Ambulation Locomotion: Ambulation Assistive Devices: Designer, industrial/product Ambulation/Gait Assistance: 4: Min assist Locomotion: Ambulation: 2: Travels 50 - 149 ft with minimal assistance (Pt.>75%)  Comprehension Comprehension Mode: Auditory Comprehension: 6-Follows complex conversation/direction: With extra time/assistive device  Expression Expression Mode: Verbal Expression: 6-Expresses complex ideas: With extra time/assistive device  Social Interaction Social Interaction: 5-Interacts appropriately 90% of the time - Needs monitoring or encouragement for participation or interaction.  Problem Solving Problem Solving: 6-Solves complex problems: With extra time  Memory Memory: 6-More than reasonable amt of time  Medical  Problem List and Plan:  1. DVT Prophylaxis/Anticoagulation: Pharmaceutical: Coumadin  2. Pain Management: continue prn tylenol./ultram  3. Mood: appears appropriate. Have LCSW follow for full evaluation.  4. Neuropsych: This patient is capable of making decisions on his/her own behalf.  5. ABLA: monitor with recheck in am. Will avoid iron supplements due to ongoing chronic GI complaints  6. Staph bacteremia: Antibiotic ( vancomycin--->daotomycin/rifampin--->ancef/rifampin) D#6/42  7. Chronic nausea: Likely exacerbated by antibiotics as well as constipation issues. Added probiotic to help with GI symptoms. ID to discontinue rifampin to see if this will help GI symptoms.  8. Constipation: multiple BM's last night. Back off regimen for the time being..  9. AKI: resolving. Continue foley for strict I/O. Continue daily renal panel. Renal status steadily improving. Can probably remove foley soon. 10.  MSK CP, will tx with topical agent and heat 11. Anxiety start prn xanax LOS (Days) 2 A FACE TO FACE EVALUATION WAS PERFORMED  Deloyd Handy E 03/10/2012, 9:53 AM

## 2012-03-10 NOTE — Progress Notes (Signed)
ANTICOAGULATION CONSULT NOTE - Follow Up Consult  Pharmacy Consult for Coumadin Indication: VTE prophylaxis  No Known Allergies  Patient Measurements: Height: 6' (182.9 cm) Weight: 220 lb 7.4 oz (100 kg) IBW/kg (Calculated) : 73.1    Vital Signs: Temp: 97.7 F (36.5 C) (09/21 0458) Temp src: Oral (09/21 0458) BP: 180/78 mmHg (09/21 1050) Pulse Rate: 68  (09/21 1050)  Labs:  Basename 03/10/12 0945 03/10/12 0930 03/09/12 0535 03/08/12 0643  HGB -- -- 8.3* 8.2*  HCT -- -- 24.7* 25.0*  PLT -- -- 334 282  APTT -- -- -- --  LABPROT 23.8* -- 22.4* 20.0*  INR 2.24* -- 2.06* 1.77*  HEPARINUNFRC -- -- -- --  CREATININE -- 2.03* 2.38* 2.82*  CKTOTAL -- -- -- --  CKMB -- -- -- --  TROPONINI -- -- -- --    Estimated Creatinine Clearance: 42.4 ml/min (by C-G formula based on Cr of 2.03).   Medications:  Scheduled:     . bacitracin   Topical BID  .  ceFAZolin (ANCEF) IV  1 g Intravenous Q8H  . feeding supplement  237 mL Oral BID BM  . hydrocortisone cream   Topical BID  . LORazepam  0.5 mg Oral Once   Or  . LORazepam  0.5 mg Intramuscular Once  . ondansetron  4 mg Oral QID   Or  . ondansetron (ZOFRAN) IV  4 mg Intravenous QID  . pantoprazole  80 mg Oral Q1200  . sodium phosphate  1 enema Rectal Once  . warfarin  5 mg Oral ONCE-1800  . Warfarin - Pharmacist Dosing Inpatient   Does not apply q1800  . DISCONTD:  ceFAZolin (ANCEF) IV  1 g Intravenous Q12H    Assessment: Patient transferred to rehab unit this evening.  51yo female, s/p I&D of knee for septic arthritis on Coumadin for VTE prophylaxis for 4 weeks (started 9/13). Coumadin was on hold for possible HD needs but SCr is improving as well as UOP.  Patient was noted to be  on rifampin which likely increases coumadin dosing requirements. However, ID discontinued the rifampin today.  INR = 2.24 which is therapeutic.   Goal of Therapy:  INR 2-3 Monitor platelets by anticoagulation protocol: Yes   Plan:  -Coumadin  5mg  x 1 today.  -Daily PT/INR -I increase cefazolin to q8h due to improved renal function.  Celedonio Miyamoto, PharmD, BCPS Clinical Pharmacist Pager [phone]   03/10/2012 1:21 PM

## 2012-03-11 ENCOUNTER — Inpatient Hospital Stay (HOSPITAL_COMMUNITY): Payer: BC Managed Care – PPO | Admitting: Physical Therapy

## 2012-03-11 ENCOUNTER — Inpatient Hospital Stay (HOSPITAL_COMMUNITY): Payer: BC Managed Care – PPO | Admitting: *Deleted

## 2012-03-11 LAB — RENAL FUNCTION PANEL
Albumin: 2.6 g/dL — ABNORMAL LOW (ref 3.5–5.2)
BUN: 18 mg/dL (ref 6–23)
CO2: 25 mEq/L (ref 19–32)
Calcium: 9.3 mg/dL (ref 8.4–10.5)
Chloride: 102 mEq/L (ref 96–112)
Creatinine, Ser: 1.81 mg/dL — ABNORMAL HIGH (ref 0.50–1.10)
GFR calc Af Amer: 36 mL/min — ABNORMAL LOW (ref >90)
GFR calc non Af Amer: 31 mL/min — ABNORMAL LOW (ref >90)
Glucose, Bld: 116 mg/dL — ABNORMAL HIGH (ref 70–99)
Phosphorus: 4.8 mg/dL — ABNORMAL HIGH (ref 2.3–4.6)
Potassium: 3.8 mEq/L (ref 3.5–5.1)
Sodium: 139 mEq/L (ref 135–145)

## 2012-03-11 LAB — PROTIME-INR
INR: 2.18 — ABNORMAL HIGH (ref 0.00–1.49)
Prothrombin Time: 23.3 seconds — ABNORMAL HIGH (ref 11.6–15.2)

## 2012-03-11 MED ORDER — WARFARIN SODIUM 5 MG PO TABS
5.0000 mg | ORAL_TABLET | Freq: Once | ORAL | Status: AC
  Administered 2012-03-11: 5 mg via ORAL
  Filled 2012-03-11: qty 1

## 2012-03-11 MED ORDER — TRAZODONE HCL 50 MG PO TABS
75.0000 mg | ORAL_TABLET | Freq: Every day | ORAL | Status: DC
  Administered 2012-03-11: 75 mg via ORAL
  Filled 2012-03-11 (×2): qty 1

## 2012-03-11 NOTE — Progress Notes (Signed)
Physical Therapy Note  Patient Details  Name: Julie Ross MRN: [id number] Date of Birth: [birth year] Today's Date: 03/11/2012  Time:  6578-4696  ( )  1st session Individual session Pain:   6/10 Left knee.    Engaged in bathing and dressing at sink.  Pt. Needed increased time for each task requiring 5 minutes to wash face and 5 minutes to brush teeth.  Pt. Asked for pain meds and nausea pill.  RN delivered.  Pt.washed UB and LB with min assist for threading catheter bag and donning footies.  Pt fatigued at end of session and asked to go to bed.  Transferred to bed with minimal asssit using RW.      Time:  1400-1430  (30 min) 2nd session Pain:  4/10  Left knee Individual session Engaged in functional mobility using wc and RW.  Pt. Propelled wc from room to ADL apart.  Pt. Using RW to ambulate in kitchen to unload dishwasher.  Needed one rest break during the task.   Pt taking short breaths during session.   Instructed pt on breathing techniques to facilitate lung capacity and improve tidal volume.   Humberto Seals 03/11/2012, 11:53 AM

## 2012-03-11 NOTE — Progress Notes (Signed)
Physical Therapy Note  Patient Details  Name: Julie Ross MRN: [id number] Date of Birth: [birth year] Today's Date: 03/11/2012  1000-1055 (55 minutes) individual Pain : 4/10 initially Lt knee increased to 7/10 with activity/premedicated Focus of treatment: Gait training on level with RW WBAT LT LE+ knee immobilizer with ambulation SBA 75 feet  (1/2 distance to room); up/down 8 steps with either one or two rails min to SBA with vcs for sequencing; sit ><  supine (mat) SBA with increased difficulty LT LE; LT LE AA SLR  2 X 10; bilateral quad sets X 20; bilateral hip abduction X 20 (AA on left) in supine; bilateral ankle pumps X 20; SAQs X 20 on right.   0865-7846 (40 minutes) individual Pain: 7/10 LT LE/premedicated Focus of treatment: Gait endurance; therapeutic exercises to improve activity tolerance Treatment: Gait - 70 feet X 1; 80 feet X 1 with RW SBA WBAT LT LE (distance limited by fatigue); Nustep bilateral UEs/ RT LE  Level 3 ( 5 minutes X 2) with one rest break. Pt too fatigued to propel wc back to room.   Eloy Fehl,JIM 03/11/2012, 7:54 AM

## 2012-03-11 NOTE — Progress Notes (Signed)
ANTICOAGULATION CONSULT NOTE - Follow Up Consult  Pharmacy Consult for Coumadin Indication: VTE prophylaxis  No Known Allergies  Patient Measurements: Height: 6' (182.9 cm) Weight: 220 lb 7.4 oz (100 kg) IBW/kg (Calculated) : 73.1    Vital Signs: Temp: 98.5 F (36.9 C) (09/22 0500) Temp src: Oral (09/22 0500) BP: 171/86 mmHg (09/22 0500) Pulse Rate: 76  (09/22 0500)  Labs:  Basename 03/11/12 1038 03/10/12 0945 03/10/12 0930 03/09/12 0535  HGB -- -- -- 8.3*  HCT -- -- -- 24.7*  PLT -- -- -- 334  APTT -- -- -- --  LABPROT 23.3* 23.8* -- 22.4*  INR 2.18* 2.24* -- 2.06*  HEPARINUNFRC -- -- -- --  CREATININE 1.81* -- 2.03* 2.38*  CKTOTAL -- -- -- --  CKMB -- -- -- --  TROPONINI -- -- -- --    Estimated Creatinine Clearance: 47.6 ml/min (by C-G formula based on Cr of 1.81).     Assessment: Patient transferred to rehab unit this evening.  51yo female, s/p I&D of knee for septic arthritis on Coumadin for VTE prophylaxis for 4 weeks (started 9/13). Coumadin was on hold for possible HD needs but SCr is improving as well as UOP.  Patient was noted to be  on rifampin which likely increases coumadin dosing requirements. Rifampin now discontinued due to nausea.  INR = 2.18 which is therapeutic.   Goal of Therapy:  INR 2-3 Monitor platelets by anticoagulation protocol: Yes   Plan:  -Coumadin 5mg  x 1 today.  -Daily PT/INR   Celedonio Miyamoto, PharmD, BCPS Clinical Pharmacist Pager [phone]   03/11/2012 12:30 PM

## 2012-03-11 NOTE — Progress Notes (Signed)
Patient ID: Julie Ross, female   DOB: [birth year], 51 y.o.   MRN: [id number] Subjective/Complaints: Chest discomfort started in bed, no exercise related symptoms yesterday, did WC propulsion No SOB, No diaphoresis, no L arm pain A 12 point review of systems has been performed and if not noted above is otherwise negative.   Objective: Vital Signs: Blood pressure 171/86, pulse 76, temperature 98.5 F (36.9 C), temperature source Oral, resp. rate 20, height 6' (1.829 m), weight 100 kg (220 lb 7.4 oz), last menstrual period 12/05/2011, SpO2 93.00%. Dg Chest Port 1 View  03/09/2012  *RADIOLOGY REPORT*  Clinical Data: Right PICC placement  PORTABLE CHEST - 1 VIEW  Comparison: 03/04/2012  Findings: Right PICC terminates in the upper right atrium, 2 cm below the cavoatrial junction.  Mild interstitial edema. No pleural effusion or pneumothorax.  The heart is top normal in size/mildly enlarged.  IMPRESSION: Right PICC terminates in the upper right atrium, 2 cm below the cavoatrial junction.  Borderline cardiomegaly with mild interstitial edema.   Original Report Authenticated By: Charline Bills, M.D.     Basename 03/09/12 0535  WBC 4.1  HGB 8.3*  HCT 24.7*  PLT 334    Basename 03/10/12 0930 03/09/12 0535  NA 136 143  K 3.6 3.9  CL 102 108  CO2 24 21  GLUCOSE 114* 115*  BUN 23 29*  CREATININE 2.03* 2.38*  CALCIUM 9.3 9.3   CBG (last 3)  No results found for this basename: GLUCAP:3 in the last 72 hours  Wt Readings from Last 3 Encounters:  03/08/12 100 kg (220 lb 7.4 oz)  03/02/12 88.451 kg (195 lb)  03/02/12 88.451 kg (195 lb)    Physical Exam:  Constitutional: She is oriented to person, place, and time. She appears well-developed and well-nourished.  HENT:  Head: Normocephalic and atraumatic.  Eyes: Pupils are equal, round, and reactive to light.  Neck: Normal range of motion.  Cardiovascular: Normal rate and regular rhythm.  Pulmonary/Chest: Effort normal and breath sounds  normal. She exhibits tenderness (at prior [facility] site. dry dressing noted. ). Left side pectoralis origen tender to palpation Abdominal: Soft. She exhibits no distension. Bowel sounds are decreased. There is no tenderness.  Musculoskeletal: She exhibits edema (bilateral hands.).  Dry dressing left knee with KI in place. Wound CDI with staples  Neurological: She is alert and oriented to person, place, and time.  Soft spoken. Follows commands without difficulty.  Skin:  Petechial rash on BUE.  Rash on back stable  Psych: affect extremely flat, mild anxiety  Assessment/Plan: 1. Functional deficits secondary to Septic L knee s/p poly exchange which require 3+ hours per day of interdisciplinary therapy in a comprehensive inpatient rehab setting. Physiatrist is providing close team supervision and 24 hour management of active medical problems listed below. Physiatrist and rehab team continue to assess barriers to discharge/monitor patient progress toward functional and medical goals. FIM: FIM - Bathing Bathing Steps Patient Completed: Chest;Right Arm;Left Arm;Abdomen;Front perineal area;Buttocks;Right upper leg;Left upper leg;Right lower leg (including foot);Left lower leg (including foot) Bathing: 3: Mod-Patient completes 5-7 79f 10 parts or 50-74%  FIM - Upper Body Dressing/Undressing Upper body dressing/undressing steps patient completed: Thread/unthread right sleeve of pullover shirt/dresss;Thread/unthread left sleeve of pullover shirt/dress;Put head through opening of pull over shirt/dress;Pull shirt over trunk Upper body dressing/undressing: 4: Min-Patient completed 75 plus % of tasks FIM - Lower Body Dressing/Undressing Lower body dressing/undressing steps patient completed: Thread/unthread right underwear leg;Thread/unthread left underwear leg;Pull underwear up/down;Thread/unthread right pants leg;Thread/unthread  left pants leg;Pull pants up/down;Don/Doff right sock;Don/Doff left sock Lower  body dressing/undressing: 1: Total-Patient completed less than 25% of tasks  FIM - Toileting Toileting steps completed by patient: Performs perineal hygiene Toileting: 2: Max-Patient completed 1 of 3 steps  FIM - Toilet Transfers Toilet Transfers: 3-To toilet/BSC: Mod A (lift or lower assist);3-From toilet/BSC: Mod A (lift or lower assist)  FIM - Bed/Chair Transfer Bed/Chair Transfer Assistive Devices: Arm rests Bed/Chair Transfer: 4: Supine > Sit: Min A (steadying Pt. > 75%/lift 1 leg);4: Sit > Supine: Min A (steadying pt. > 75%/lift 1 leg);3: Bed > Chair or W/C: Mod A (lift or lower assist);3: Chair or W/C > Bed: Mod A (lift or lower assist)  FIM - Locomotion: Wheelchair Locomotion: Wheelchair: 2: Travels 50 - 149 ft with supervision, cueing or coaxing FIM - Locomotion: Ambulation Locomotion: Ambulation Assistive Devices: Designer, industrial/product Ambulation/Gait Assistance: 4: Min assist Locomotion: Ambulation: 2: Travels 50 - 149 ft with minimal assistance (Pt.>75%)  Comprehension Comprehension Mode: Auditory Comprehension: 6-Follows complex conversation/direction: With extra time/assistive device  Expression Expression Mode: Verbal Expression: 6-Expresses complex ideas: With extra time/assistive device  Social Interaction Social Interaction: 5-Interacts appropriately 90% of the time - Needs monitoring or encouragement for participation or interaction.  Problem Solving Problem Solving: 6-Solves complex problems: With extra time  Memory Memory: 6-More than reasonable amt of time  Medical Problem List and Plan:  1. DVT Prophylaxis/Anticoagulation: Pharmaceutical: Coumadin  2. Pain Management: continue prn tylenol./ultram  3. Mood: appears appropriate. Have LCSW follow for full evaluation.  4. Neuropsych: This patient is capable of making decisions on his/her own behalf.  5. ABLA: monitor with recheck in am. Will avoid iron supplements due to ongoing chronic GI complaints  6. Staph  bacteremia: Antibiotic ( vancomycin--->daotomycin/rifampin--->ancef/rifampin) D#8/42  7. Chronic nausea: Likely exacerbated by antibiotics as well as constipation issues. Added probiotic to help with GI symptoms. ID to discontinue rifampin to see if this will help GI symptoms.  8. Constipation: multiple BM's last night. Back off regimen for the time being..  9. AKI: resolving. Continue foley for strict I/O. Continue daily renal panel. Renal status steadily improving. Can probably remove foley soon. 10.  MSK CP, will tx with topical agent and heat 11. Anxiety start prn xanax, poor sleep not pain related increase trazodone LOS (Days) 3 A FACE TO FACE EVALUATION WAS PERFORMED  KIRSTEINS,ANDREW E 03/11/2012, 10:05 AM

## 2012-03-11 NOTE — Progress Notes (Signed)
Patient ID: Julie Ross, female   DOB: Jul 08, 1960, 51 y.o.   MRN: [id number]    Subjective: No C/O  Objective: Vital signs in last 24 hours: Temp:  [97.9 F (36.6 C)-98.5 F (36.9 C)] 98.5 F (36.9 C) (09/22 0500) Pulse Rate:  [76-77] 76  (09/22 0500) Resp:  [20-21] 20  (09/22 0500) BP: (171-177)/(76-86) 171/86 mmHg (09/22 0500) SpO2:  [93 %-95 %] 93 % (09/22 0500) Last BM Date: 03/10/12  Intake/Output from previous day: 09/21 0701 - 09/22 0700 In: 770 [P.O.:720; IV Piggyback:50] Out: 2304 [Urine:2300; Stool:4] Intake/Output this shift: Total I/O In: -  Out: 401 [Urine:400; Stool:1]  Incision/Wound: Portacath removal wound with minimal erythema and drainage  Lab Results:   [facility] 03/09/12 0535  WBC 4.1  HGB 8.3*  HCT 24.7*  PLT 334   BMET  Basename 03/11/12 1038 03/10/12 0930  NA 139 136  K 3.8 3.6  CL 102 102  CO2 25 24  GLUCOSE 116* 114*  BUN 18 23  CREATININE 1.81* 2.03*  CALCIUM 9.3 9.3     Studies/Results: Dg Chest Port 1 View  03/09/2012  *RADIOLOGY REPORT*  Clinical Data: Right PICC placement  PORTABLE CHEST - 1 VIEW  Comparison: 03/04/2012  Findings: Right PICC terminates in the upper right atrium, 2 cm below the cavoatrial junction.  Mild interstitial edema. No pleural effusion or pneumothorax.  The heart is top normal in size/mildly enlarged.  IMPRESSION: Right PICC terminates in the upper right atrium, 2 cm below the cavoatrial junction.  Borderline cardiomegaly with mild interstitial edema.   Original Report Authenticated By: Charline Bills, M.D.     Anti-infectives: Anti-infectives     Start     Dose/Rate Route Frequency Ordered Stop   03/10/12 2200   ceFAZolin (ANCEF) IVPB 1 g/50 mL premix        1 g 100 mL/hr over 30 Minutes Intravenous 3 times per day 03/10/12 1320     03/08/12 2100   ceFAZolin (ANCEF) IVPB 1 g/50 mL premix  Status:  Discontinued        1 g 100 mL/hr over 30 Minutes Intravenous Every 12 hours 03/08/12 1846 03/10/12  1320          Assessment/Plan: Potacath removal wound healing well.. Remove sutures later this week    LOS: 3 days    Mailynn Everly T 03/11/2012

## 2012-03-12 ENCOUNTER — Inpatient Hospital Stay (HOSPITAL_COMMUNITY): Payer: BC Managed Care – PPO

## 2012-03-12 ENCOUNTER — Encounter: Payer: BC Managed Care – PPO | Admitting: Physical Therapy

## 2012-03-12 ENCOUNTER — Inpatient Hospital Stay (HOSPITAL_COMMUNITY): Payer: BC Managed Care – PPO | Admitting: Occupational Therapy

## 2012-03-12 ENCOUNTER — Encounter (HOSPITAL_COMMUNITY): Payer: BC Managed Care – PPO | Admitting: Occupational Therapy

## 2012-03-12 DIAGNOSIS — R7881 Bacteremia: Secondary | ICD-10-CM

## 2012-03-12 DIAGNOSIS — R5381 Other malaise: Secondary | ICD-10-CM

## 2012-03-12 DIAGNOSIS — A4901 Methicillin susceptible Staphylococcus aureus infection, unspecified site: Secondary | ICD-10-CM

## 2012-03-12 DIAGNOSIS — Z5189 Encounter for other specified aftercare: Secondary | ICD-10-CM

## 2012-03-12 DIAGNOSIS — M009 Pyogenic arthritis, unspecified: Secondary | ICD-10-CM

## 2012-03-12 LAB — RENAL FUNCTION PANEL
Albumin: 2.6 g/dL — ABNORMAL LOW (ref 3.5–5.2)
BUN: 16 mg/dL (ref 6–23)
CO2: 27 mEq/L (ref 19–32)
Calcium: 9.2 mg/dL (ref 8.4–10.5)
Chloride: 104 mEq/L (ref 96–112)
Creatinine, Ser: 1.77 mg/dL — ABNORMAL HIGH (ref 0.50–1.10)
GFR calc Af Amer: 37 mL/min — ABNORMAL LOW (ref >90)
GFR calc non Af Amer: 32 mL/min — ABNORMAL LOW (ref >90)
Glucose, Bld: 111 mg/dL — ABNORMAL HIGH (ref 70–99)
Phosphorus: 5.3 mg/dL — ABNORMAL HIGH (ref 2.3–4.6)
Potassium: 4 mEq/L (ref 3.5–5.1)
Sodium: 141 mEq/L (ref 135–145)

## 2012-03-12 LAB — PROTIME-INR
INR: 1.99 — ABNORMAL HIGH (ref 0.00–1.49)
Prothrombin Time: 21.8 seconds — ABNORMAL HIGH (ref 11.6–15.2)

## 2012-03-12 MED ORDER — WARFARIN SODIUM 7.5 MG PO TABS
7.5000 mg | ORAL_TABLET | Freq: Once | ORAL | Status: AC
  Administered 2012-03-12: 7.5 mg via ORAL
  Filled 2012-03-12 (×2): qty 1

## 2012-03-12 MED ORDER — CLONIDINE HCL 0.1 MG PO TABS
0.1000 mg | ORAL_TABLET | Freq: Three times a day (TID) | ORAL | Status: DC | PRN
  Administered 2012-03-13 – 2012-03-14 (×2): 0.1 mg via ORAL
  Filled 2012-03-12 (×3): qty 1

## 2012-03-12 MED ORDER — FUROSEMIDE 20 MG PO TABS
20.0000 mg | ORAL_TABLET | Freq: Two times a day (BID) | ORAL | Status: DC
  Administered 2012-03-12 (×2): 20 mg via ORAL
  Filled 2012-03-12 (×5): qty 1

## 2012-03-12 MED ORDER — GABAPENTIN 100 MG PO CAPS
100.0000 mg | ORAL_CAPSULE | Freq: Three times a day (TID) | ORAL | Status: DC
  Administered 2012-03-12 – 2012-03-16 (×11): 100 mg via ORAL
  Filled 2012-03-12 (×14): qty 1

## 2012-03-12 MED ORDER — HYDROXYZINE HCL 25 MG PO TABS
25.0000 mg | ORAL_TABLET | Freq: Three times a day (TID) | ORAL | Status: DC | PRN
  Administered 2012-03-12 – 2012-03-16 (×9): 25 mg via ORAL
  Filled 2012-03-12 (×9): qty 1

## 2012-03-12 MED ORDER — LOSARTAN POTASSIUM 50 MG PO TABS
50.0000 mg | ORAL_TABLET | Freq: Every day | ORAL | Status: DC
  Administered 2012-03-12: 50 mg via ORAL
  Filled 2012-03-12 (×3): qty 1

## 2012-03-12 NOTE — Progress Notes (Signed)
Physical Therapy Session Note  Patient Details  Name: Julie Ross MRN: [id number] Date of Birth: 03/06/1961  Today's Date: 03/12/2012 Time: 4098-1191 Time Calculation (min): 38 min  Session started late due to pt phone call and discussion with PA.  Focus on functional transfers with RW and negotiating stairs for home mobility x 10 steps with 1 and 2 rails with steady A, cues for which foot to lead with. Worked on ROM to LLE for heel slides and LAQ x 3 sets of 10 reps each.  BP was taken at 1603, 184/164mmHg and HR = 71 bpm   Therapy Documentation Precautions:  Precautions Precautions: Fall Required Braces or Orthoses: Knee Immobilizer - Left Knee Immobilizer - Left: During gait LLE Weight Bearing: Weight bearing as tolerated  Pain:  Denies pain. Reports feeling a little better than earlier but still feels bad.  See FIM for current functional status  Therapy/Group: Individual Therapy  [name] [facility] 03/12/2012, 4:24 PM

## 2012-03-12 NOTE — Progress Notes (Signed)
Patient ID: Julie Ross, female   DOB: [birth year], 51 y.o.   MRN: [id number] Subjective/Complaints: uhappy with nursing care overnight. Feels she's gotten an inconsistent message regarding fluid intake No SOB, No diaphoresis, no L arm pain A 12 point review of systems has been performed and if not noted above is otherwise negative.   Objective: Vital Signs: Blood pressure 181/78, pulse 83, temperature 98.5 F (36.9 C), temperature source Oral, resp. rate 20, height 6' (1.829 m), weight 100 kg (220 lb 7.4 oz), last menstrual period 12/05/2011, SpO2 95.00%. No results found. No results found for this basename: WBC:2,HGB:2,HCT:2,PLT:2 in the last 72 hours  Basename 03/12/12 0530 03/11/12 1038  NA 141 139  K 4.0 3.8  CL 104 102  CO2 27 25  GLUCOSE 111* 116*  BUN 16 18  CREATININE 1.77* 1.81*  CALCIUM 9.2 9.3   CBG (last 3)  No results found for this basename: GLUCAP:3 in the last 72 hours  Wt Readings from Last 3 Encounters:  03/08/12 100 kg (220 lb 7.4 oz)  03/02/12 88.451 kg (195 lb)  03/02/12 88.451 kg (195 lb)    Physical Exam:  Constitutional: She is oriented to person, place, and time. She appears well-developed and well-nourished.  HENT:  Head: Normocephalic and atraumatic.  Eyes: Pupils are equal, round, and reactive to light.  Neck: Normal range of motion.  Cardiovascular: Normal rate and regular rhythm.  Pulmonary/Chest: Effort normal and breath sounds normal. She exhibits tenderness (at prior [facility] site. dry dressing noted. ). Left side pectoralis origen tender to palpation Abdominal: Soft. She exhibits no distension. Bowel sounds are decreased. There is no tenderness.  Musculoskeletal: She exhibits edema (bilateral hands.).  Dry dressing left knee with KI in place. Wound CDI with staples  Neurological: She is alert and oriented to person, place, and time.  Soft spoken. Follows commands without difficulty.  Skin:  Petechial rash on BUE.  Rash on back stable    Psych: affect extremely flat, mild anxiety  Assessment/Plan: 1. Functional deficits secondary to Septic L knee s/p poly exchange which require 3+ hours per day of interdisciplinary therapy in a comprehensive inpatient rehab setting. Physiatrist is providing close team supervision and 24 hour management of active medical problems listed below. Physiatrist and rehab team continue to assess barriers to discharge/monitor patient progress toward functional and medical goals. FIM: FIM - Bathing Bathing Steps Patient Completed: Chest;Right Arm;Left Arm;Abdomen;Front perineal area;Right upper leg;Left upper leg Bathing: 3: Mod-Patient completes 5-7 25f 10 parts or 50-74%  FIM - Upper Body Dressing/Undressing Upper body dressing/undressing steps patient completed: Thread/unthread right sleeve of pullover shirt/dresss;Thread/unthread left sleeve of pullover shirt/dress;Put head through opening of pull over shirt/dress;Pull shirt over trunk Upper body dressing/undressing: 5: Set-up assist to: Obtain clothing/put away FIM - Lower Body Dressing/Undressing Lower body dressing/undressing steps patient completed: Thread/unthread right underwear leg;Thread/unthread left underwear leg;Pull underwear up/down;Thread/unthread right pants leg;Thread/unthread left pants leg;Pull pants up/down Lower body dressing/undressing: 4: Min-Patient completed 75 plus % of tasks  FIM - Toileting Toileting steps completed by patient: Performs perineal hygiene Toileting: 0: Activity did not occur  FIM - Archivist Transfers: 0-Activity did not occur  FIM - Banker Devices: Therapist, occupational: 4: Chair or W/C > Bed: Min A (steadying Pt. > 75%)  FIM - Locomotion: Wheelchair Locomotion: Wheelchair: 2: Travels 50 - 149 ft with supervision, cueing or coaxing FIM - Locomotion: Ambulation Locomotion: Ambulation Assistive Devices: Designer, industrial/product Ambulation/Gait  Assistance: 4: Min assist  Locomotion: Ambulation: 2: Travels 50 - 149 ft with minimal assistance (Pt.>75%)  Comprehension Comprehension Mode: Auditory Comprehension: 6-Follows complex conversation/direction: With extra time/assistive device  Expression Expression Mode: Verbal Expression: 6-Expresses complex ideas: With extra time/assistive device  Social Interaction Social Interaction: 5-Interacts appropriately 90% of the time - Needs monitoring or encouragement for participation or interaction.  Problem Solving Problem Solving: 6-Solves complex problems: With extra time  Memory Memory: 6-More than reasonable amt of time  Medical Problem List and Plan:  1. DVT Prophylaxis/Anticoagulation: Pharmaceutical: Coumadin  2. Pain Management: continue prn tylenol./ultram  3. Mood: appears appropriate. Have LCSW follow for full evaluation.  4. Neuropsych: This patient is capable of making decisions on his/her own behalf.  5. ABLA: monitor with recheck in am. Will avoid iron supplements due to ongoing chronic GI complaints  6. Staph bacteremia: ancef-  -still itching, ?alternatives  -vistaril for sx   7. Chronic nausea: Likely exacerbated by antibiotics as well as constipation issues. Added probiotic to help with GI symptoms. ID to discontinue rifampin to see if this will help GI symptoms.  8. Constipation:   9. AKI: resolving. Continue foley for strict I/O. Continue daily renal panel. I think she is volume overloaded. BP's elevated also. Restart losartan. Begin lasix.  -needs daily weights  -clonidine for bp spikes  -consider nephrology follow up 10.  MSK CP, will tx with topical agent and heat 11. Anxiety start prn xanax, poor sleep not pain related increase trazodone LOS (Days) 4 A FACE TO FACE EVALUATION WAS PERFORMED  SWARTZ,ZACHARY T 03/12/2012, 8:35 AM

## 2012-03-12 NOTE — Progress Notes (Signed)
INFECTIOUS DISEASE PROGRESS NOTE  ID: Julie Ross is a 51 y.o. female with   Principal Problem:  *Septic joint of left knee joint Active Problems:  Multiple sclerosis  Bacteremia  Subjective: C/o itching  Abtx:  Anti-infectives     Start     Dose/Rate Route Frequency Ordered Stop   03/10/12 2200   ceFAZolin (ANCEF) IVPB 1 g/50 mL premix        1 g 100 mL/hr over 30 Minutes Intravenous 3 times per day 03/10/12 1320     03/08/12 2100   ceFAZolin (ANCEF) IVPB 1 g/50 mL premix  Status:  Discontinued        1 g 100 mL/hr over 30 Minutes Intravenous Every 12 hours 03/08/12 1846 03/10/12 1320          Medications:  Scheduled:   . bacitracin   Topical BID  .  ceFAZolin (ANCEF) IV  1 g Intravenous Q8H  . feeding supplement  237 mL Oral BID BM  . furosemide  20 mg Oral BID  . hydrocortisone cream   Topical BID  . losartan  50 mg Oral Daily  . ondansetron  4 mg Oral QID   Or  . ondansetron (ZOFRAN) IV  4 mg Intravenous QID  . pantoprazole  80 mg Oral Q1200  . sodium phosphate  1 enema Rectal Once  . traZODone  75 mg Oral QHS  . warfarin  5 mg Oral ONCE-1800  . Warfarin - Pharmacist Dosing Inpatient   Does not apply q1800    Objective: Vital signs in last 24 hours: Temp:  [97.5 F (36.4 C)-98.5 F (36.9 C)] 98.5 F (36.9 C) (09/23 0500) Pulse Rate:  [72-88] 83  (09/23 0500) Resp:  [18-20] 20  (09/23 0500) BP: (181-184)/(78-86) 181/78 mmHg (09/23 0829) SpO2:  [95 %-98 %] 95 % (09/23 0500)   General appearance: alert, cooperative and no distress Resp: clear to auscultation bilaterally Chest wall: no tenderness, wound R anterior chest is clean. no d/c.  Cardio: regular rate and rhythm GI: normal findings: bowel sounds normal and soft, non-tender Skin: faint, small papules on back. erythem aon RLE. LLE knee wound is partially undressed, clean.  Lab Results  Basename 03/12/12 0530 03/11/12 1038  WBC -- --  HGB -- --  HCT -- --  NA 141 139  K 4.0 3.8  CL  104 102  CO2 27 25  BUN 16 18  CREATININE 1.77* 1.81*  GLU -- --   Liver Panel  Basename 03/12/12 0530 03/11/12 1038  PROT -- --  ALBUMIN 2.6* 2.6*  AST -- --  ALT -- --  ALKPHOS -- --  BILITOT -- --  BILIDIR -- --  IBILI -- --   Sedimentation Rate No results found for this basename: ESRSEDRATE in the last 72 hours C-Reactive Protein No results found for this basename: CRP:2 in the last 72 hours  Microbiology: Recent Results (from the past 240 hour(s))  SURGICAL PCR SCREEN     Status: Abnormal   Collection Time   03/02/12  1:35 PM      Component Value Range Status Comment   MRSA, PCR NEGATIVE  NEGATIVE Final    Staphylococcus aureus POSITIVE (*) NEGATIVE Final   ANAEROBIC CULTURE     Status: Normal   Collection Time   03/02/12  4:20 PM      Component Value Range Status Comment   Specimen Description SYNOVIAL FLUID KNEE LEFT   Final    Special Requests RECEIVED  ON SWAB   Final    Gram Stain     Final    Value: MODERATE WBC PRESENT, PREDOMINANTLY PMN     RARE GRAM POSITIVE COCCI     IN PAIRS Gram Stain Report Called to,Read Back By and Verified With: Gram Stain Report Called to,Read Back By and Verified With: DR GRAVES AT 1652 03/02/12 BY ROMEROJ Performed at [facility]   Culture NO ANAEROBES ISOLATED   Final    Report Status 03/07/2012 FINAL   Final   BODY FLUID CULTURE     Status: Normal   Collection Time   03/02/12  4:20 PM      Component Value Range Status Comment   Specimen Description SYNOVIAL FLUID KNEE LEFT   Final    Special Requests RECEIVED ON SWAB   Final    Gram Stain     Final    Value: MODERATE WBC PRESENT, PREDOMINANTLY PMN     RARE GRAM POSITIVE COCCI     IN PAIRS Gram Stain Report Called to,Read Back By and Verified With: Gram Stain Report Called to,Read Back By and Verified With: DR GRAVES AT 1652 03/02/12 BY ROMEROJ Performed at [facility]   Culture     Final    Value: MODERATE STAPHYLOCOCCUS AUREUS     Note: RIFAMPIN AND  GENTAMICIN SHOULD NOT BE USED AS SINGLE DRUGS FOR TREATMENT OF STAPH INFECTIONS.   Report Status 03/04/2012 FINAL   Final    Organism ID, Bacteria STAPHYLOCOCCUS AUREUS   Final   GRAM STAIN     Status: Normal   Collection Time   03/02/12  4:20 PM      Component Value Range Status Comment   Specimen Description SYNOVIAL FLUID KNEE LEFT   Final    Special Requests NONE   Final    Gram Stain     Final    Value: MODERATE WBC PRESENT, PREDOMINANTLY PMN     RARE GRAM POSITIVE COCCI IN PAIRS     CALLED TO DR. Luiz Blare 9.13.13 AT 1652 BY ROMEROJ   Report Status 03/02/2012 FINAL   Final   CULTURE, BLOOD (ROUTINE X 2)     Status: Normal   Collection Time   03/03/12 12:25 AM      Component Value Range Status Comment   Specimen Description BLOOD RIGHT HAND   Final    Special Requests BOTTLES DRAWN AEROBIC AND ANAEROBIC 5CC EACH   Final    Culture  Setup Time 03/03/2012 11:01   Final    Culture     Final    Value: STAPHYLOCOCCUS AUREUS     Note: RIFAMPIN AND GENTAMICIN SHOULD NOT BE USED AS SINGLE DRUGS FOR TREATMENT OF STAPH INFECTIONS. This organism DOES NOT demonstrate inducible Clindamycin resistance in vitro.     Note: Gram Stain Report Called to,Read Back By and Verified With: ERIN ARNOLD 03/04/12 @ 11:30AM BY RUSCA.   Report Status 03/06/2012 FINAL   Final    Organism ID, Bacteria STAPHYLOCOCCUS AUREUS   Final   CULTURE, BLOOD (ROUTINE X 2)     Status: Normal   Collection Time   03/03/12 12:40 AM      Component Value Range Status Comment   Specimen Description BLOOD RIGHT HAND   Final    Special Requests BOTTLES DRAWN AEROBIC AND ANAEROBIC [facility] EACH   Final    Culture  Setup Time 03/03/2012 11:01   Final    Culture     Final  Value: STAPHYLOCOCCUS AUREUS     Note: SUSCEPTIBILITIES PERFORMED ON PREVIOUS CULTURE WITHIN THE LAST 5 DAYS.     Note: Gram Stain Report Called to,Read Back By and Verified With: ERIN ARNOLD 03/04/12 @ 11:30AM BY RUSCA.   Report Status 03/06/2012 FINAL   Final     URINE CULTURE     Status: Normal   Collection Time   03/04/12  3:50 AM      Component Value Range Status Comment   Specimen Description URINE, CATHETERIZED   Final    Special Requests PATIENT ON FOLLOWING VANCOMYCIN   Final    Culture  Setup Time 03/04/2012 11:16   Final    Colony Count NO GROWTH   Final    Culture NO GROWTH   Final    Report Status 03/05/2012 FINAL   Final   CULTURE, BLOOD (ROUTINE X 2)     Status: Normal (Preliminary result)   Collection Time   03/07/12 12:30 PM      Component Value Range Status Comment   Specimen Description BLOOD RIGHT HAND   Final    Special Requests BOTTLES DRAWN AEROBIC ONLY 8CC   Final    Culture  Setup Time 03/07/2012 20:02   Final    Culture     Final    Value:        BLOOD CULTURE RECEIVED NO GROWTH TO DATE CULTURE WILL BE HELD FOR 5 DAYS BEFORE ISSUING A FINAL NEGATIVE REPORT   Report Status PENDING   Incomplete   CULTURE, BLOOD (ROUTINE X 2)     Status: Normal (Preliminary result)   Collection Time   03/07/12 12:40 PM      Component Value Range Status Comment   Specimen Description BLOOD RIGHT HAND   Final    Special Requests BOTTLES DRAWN AEROBIC ONLY [facility]   Final    Culture  Setup Time 03/07/2012 20:01   Final    Culture     Final    Value:        BLOOD CULTURE RECEIVED NO GROWTH TO DATE CULTURE WILL BE HELD FOR 5 DAYS BEFORE ISSUING A FINAL NEGATIVE REPORT   Report Status PENDING   Incomplete     Studies/Results: No results found.   Assessment/Plan:  Septic Arthritis/Osteomyelitis (staph aureus)  L TKR  Staph aureus (MSSA) bacteremia 2/2  TEE (-)  ARF, resolving  Breast Cancer (L) T1 N1 (stage II)  MS  Constipation  Total days of antibiotics 11 (vancomycin --> cubicin/rifampin--> ancef/rifampin--> ancef)  Would-  Few remaining good IV choices. Could consider change to carbapenem? Would have overly broad coverage.  Try to improve her n/v- zofran? Watch Cr, continues to improve  Will need long term anbx (~ 6 weeks) as  well as chemo. Could place [facility], then transition to a new port.   Johny Sax Infectious Diseases [phone] 03/12/2012, 9:53 AM   LOS: 4 days

## 2012-03-12 NOTE — Progress Notes (Signed)
Patient states " no change in feelings, still feel like in a fog, dizzy, light headed," recheck of BP in right forearm 188/88 heart rate 92. Patient sitting in chair sipping on ginger ale. Patient does not meet parameters for prn clonidine. Recheck BP now at 1515 186/87 heart rate 88, pulse ox 95%, recheck BP at 1530 182/74 pulse 68 patient now complaining of indigestion took sweet tea away and trying to sip more on the ginger ale,patient states" can not keep a train of thought" at 1545 BP 186/82 pulse 76 oxygen sats 94%, spoke to sister via cell phone in patient's room with patient's verbal consent to discuss her status. Made aware of discussion of medications adjusted, plan for foley, working with therapies, conference tomorrow to establish discharge date, social worker will be in to discuss outcome of conference tomorrow. Sister request that PA/MD call to discuss the status of patient her number is [phone], her name is Dois Davenport. Patient going to work with PT to tolerance, PT aware to check BP at 1600. Patient and sister aware of plan of care. Roberts-VonCannon, Julie Ross

## 2012-03-12 NOTE — Progress Notes (Signed)
Patient continues to complain of the symptoms of indigestion, dizzy, being in fog,light headed, BP at 1600 during therapy is 168/111 pulse 74, BP check at 1630 178/72 pulse 88, Pam Love, PA called and given information, Pam Love, Pa in to see patient and spouse. Roberts-VonCannon, Laasia Arcos Elon Jester

## 2012-03-12 NOTE — Progress Notes (Signed)
Occupational Therapy Session Note  Patient Details  Name: Julie Ross MRN: [id number] Date of Birth: 1960/09/18  Today's Date: 03/12/2012 Time: 1133-1203 Time Calculation (min): 30 min  Short Term Goals: Week 1:  OT Short Term Goal 1 (Week 1): Patient will perform LB dressing with Min Assist with AE. OT Short Term Goal 2 (Week 1): Patient will perform LB bathing with Min Assist with AE. OT Short Term Goal 3 (Week 1): Patient will perform toileting with Supervision. OT Short Term Goal 4 (Week 1): Patient will perform toilet transfer with Supervision. OT Short Term Goal 5 (Week 1): Patient will increase Bil UE strength and endurance to increase functional performance during sit to stands.  Skilled Therapeutic Interventions/Progress Updates:    Bathing and dressing session sit to stand at the sink.  Pt min assist for ambulating to the bathroom to gather her towels and washcloths with the RW as well as her clothes.  Able to utilize reacher for removing her socks and is familiar with use of AE from her previous knee surgery.  Needs only close supervision for sit to stand from wheelchair.  Complains of itching all over but no pain in the left knee.  Still wearing KI for all mobility.  Therapy Documentation Precautions:  Precautions Precautions: Fall Precaution Comments: NO KNEE ROM L  Required Braces or Orthoses: Knee Immobilizer - Left Knee Immobilizer - Left: On at all times Restrictions Weight Bearing Restrictions: No LLE Weight Bearing: Weight bearing as tolerated  Pain: Pain Assessment Pain Assessment: No/denies pain ADL: See FIM for current functional status  Therapy/Group: Individual Therapy  Dejour Vos OTR/L  03/12/2012, 12:21 PM

## 2012-03-12 NOTE — Progress Notes (Signed)
Pt anxious +++ c/o indigestion and felling "like when I was getting the infection and had to be taken to ICU". We attempted to take VS but pt could not stay still for B/P. She yell " take it off !,  take it off ! Temp 98.5 HR 88 RR 18 Sat 95 % R/A. Maalox 30 mls suspension  and Klonopin 0.5 mg administer. Pt stated that she has not slept in several days. Pt  received TrazoDone 75 mg at hs . We will continue to monitor.

## 2012-03-12 NOTE — Progress Notes (Signed)
ANTICOAGULATION CONSULT NOTE - Follow Up Consult  Pharmacy Consult:  Coumadin Indication: VTE prophylaxis  No Known Allergies  Patient Measurements: Height: 6' (182.9 cm) Weight: 220 lb 7.4 oz (100 kg) IBW/kg (Calculated) : 73.1    Vital Signs: Temp: 98.5 F (36.9 C) (09/23 0500) Temp src: Oral (09/23 0500) BP: 181/78 mmHg (09/23 0829) Pulse Rate: 83  (09/23 0500)  Labs:  Basename 03/12/12 0530 03/11/12 1038 03/10/12 0945 03/10/12 0930  HGB -- -- -- --  HCT -- -- -- --  PLT -- -- -- --  APTT -- -- -- --  LABPROT 21.8* 23.3* 23.8* --  INR 1.99* 2.18* 2.24* --  HEPARINUNFRC -- -- -- --  CREATININE 1.77* 1.81* -- 2.03*  CKTOTAL -- -- -- --  CKMB -- -- -- --  TROPONINI -- -- -- --    Estimated Creatinine Clearance: 48.7 ml/min (by C-G formula based on Cr of 1.77).     Assessment:  4 YOF, s/p I&D of knee for septic arthritis, on Coumadin for VTE prophylaxis for 4 weeks (started 9/13).  INR decreased to slightly subtherapeutic level today, no bleeding reported.   Goal of Therapy:  INR 2-3 Monitor platelets by anticoagulation protocol: Yes    Plan:  - Coumadin 7.5mg  PO today - Daily PT/INR - F/U resume home meds as appropriate: baclofen, gabapentin, duloxetine      Roxas Clymer D. Laney Potash, PharmD, BCPS Pager:  [phone] 03/12/2012, 10:33 AM

## 2012-03-12 NOTE — Progress Notes (Signed)
Pt appears calmer, no visible tremors noted. Guaifenesin  10 mls administered for cough. We will continue to monitor.

## 2012-03-12 NOTE — Progress Notes (Signed)
OT therapist reported that patient feeling light headed like going to pass out at end of session and placed patient in recliner. Upon entering the room, patient stated she felt  Like " in a fog, disoriented, like in tunnel, feels like talking to her from a far away place, dizzy,light headed, when she was up symptoms worse. Blood pressure  in right forearm 190/86, heart rate 111, recheck at 1445 183/85 pulse 82. Marissa Nestle, Pa contacted and made aware of above data, discussed with this writer that felt like it was due to BP being elevated, recheck BP in 15 minutes and proceed from there. Discussed plan with patient verbalized understanding. Will  Continue to monitor. Roberts-VonCannon, Song Garris Elon Jester

## 2012-03-12 NOTE — Progress Notes (Signed)
Pt is now asleep. Pt fell asleep during our interaction. We will continue to monitor.

## 2012-03-12 NOTE — Progress Notes (Signed)
Physical Therapy Session Note  Patient Details  Name: EUSTACIA URBANEK MRN: [id number] Date of Birth: 12-Oct-1960  Today's Date: 03/12/2012 Time: 1133-1203 Time Calculation (min): 30 min  Pt with difficult morning and weekend, emotional support provided. Steady A transfer from bed with RW, step onto scale and gait to gym with RW with S. Nustep for functional strengthening and endurance x 10 min with LLE propped on pillows (declined ROM at this time due to just having leg re-wrapped, powder applied and KI placed). End of session positioned in recliner with LE's elevated and KI taken off so leg can breathe (increased itching due to heat).   Therapy Documentation Precautions:  Precautions Precautions: Fall Required Braces or Orthoses: Knee Immobilizer - Left Knee Immobilizer - Left: During gait Restrictions Weight Bearing Restrictions: Yes LLE Weight Bearing: Weight bearing as tolerated  Pain:  No complaints. Itching on LE's - RN aware and anti-fugal powder applied before re-wrapping leg.  See FIM for current functional status  Therapy/Group: Individual Therapy  [name] [facility] 03/12/2012, 12:19 PM

## 2012-03-12 NOTE — Progress Notes (Signed)
Social Work Assessment and Plan Social Work Assessment and Plan  Patient Details  Name: Julie Ross MRN: [id number] Date of Birth: [birth year]  Today's Date: 03/12/2012  Problem List:  Patient Active Problem List  Diagnosis  . Osteoarthritis of both knees  . Osteoarthritis of left knee  . Cancer of upper-outer quadrant of female breast  . Multiple sclerosis  . Dehydration  . Seroma, postoperative  . Infected prosthetic knee joint  . Hypokalemia  . Hyponatremia  . Bacteremia  . Septic joint of left knee joint   Past Medical History:  Past Medical History  Diagnosis Date  . Hypertension   . Hypothyroidism   . Arthritis   . Neuromuscular disorder     MS TX WITH CYMBALTA   . Multiple sclerosis   . H/O colonoscopy   . H/O bone density study 03/2011  . Wears glasses   . Heart burn   . Depression   . Sleep apnea     MILD SLEEP APNEA, NO MACHINE  6 YRS AGO [facility]PT [facility]   . Breast cancer     lt breast   Past Surgical History:  Past Surgical History  Procedure Date  . Tonsillectomy   . Tumor removal     FROM PELVIS AGE 56  . Cesarean section     X2   . No past surgeries     BLADDER SLING  4-5 YRS AGO   . Knee arthroscopy     LT KNEE 04/2011  . Total knee arthroplasty 08/15/2011    Procedure: TOTAL KNEE ARTHROPLASTY; lft Surgeon: Julie Junior, MD;  Location: [facility] OR;  Service: Orthopedics;  Laterality: Left;  RIGHT KNEE CORTIZONE INJECTION  . Mohs surgery   . Knee arthroscopy 82    rt  . Joint replacement Feb 2013  . Mastectomy w/ sentinel node biopsy 12/19/2011    Procedure: MASTECTOMY WITH SENTINEL LYMPH NODE BIOPSY;  Surgeon: Julie Paris, MD;  Location: [facility] OR;  Service: General;  Laterality: Left;  left breast and left axilla   . Portacath placement 01/16/2012    Procedure: INSERTION PORT-A-CATH;  Surgeon: Julie Paris, MD;  Location: [facility];  Service: General;  Laterality: Right;  Porta Cath Placement   . I&d knee with poly exchange  03/02/2012    Procedure: IRRIGATION AND DEBRIDEMENT KNEE WITH POLY EXCHANGE;  Surgeon: Julie Junior, MD;  Location: [facility] OR;  Service: Orthopedics;  Laterality: Left;  I&D of total knee with Possible Poly Exchange   . Tee without cardioversion 03/06/2012    Procedure: TRANSESOPHAGEAL ECHOCARDIOGRAM (TEE);  Surgeon: Julie Stade, MD;  Location: [facility] ENDOSCOPY;  Service: Cardiovascular;  Laterality: N/A;  Rm. [id number]  . Port-a-cath removal 03/06/2012    Procedure: REMOVAL PORT-A-CATH;  Surgeon: Julie Pollack, MD;  Location: [facility] OR;  Service: General;  Laterality: N/A;   Social History:  reports that she has never smoked. She has never used smokeless tobacco. She reports that she does not drink alcohol or use illicit drugs.  Family / Support Systems Marital Status: Married Patient Roles: Spouse;Parent (employee) Spouse/Significant Other: Julie Ross @ (H) [phone] or (C) [phone] Children: daughter, Julie Ross, [phone]) [phone] (attends and lives on campus:  [facility]) and daughter, Julie Ross - lives at home with pt Anticipated Caregiver: spouse and family Ability/Limitations of Caregiver: works Engineer, structural Availability: Intermittent Family Dynamics: Pt describes family as very supportive - husband plans to take off work approx 1 week upon her d/c  Social  History Preferred language: English Religion: Catholic Cultural Background: NA Education: college Read: Yes Write: Yes Employment Status: Employed (but on medical leave since June 2013) Return to Work Plans: TBD Fish farm manager Issues: None Guardian/Conservator: None   Abuse/Neglect Physical Abuse: Denies Verbal Abuse: Denies Sexual Abuse: Denies Exploitation of patient/patient's resources: Denies Self-Neglect: Denies  Emotional Status Pt's affect, behavior adn adjustment status: Pt alert and oriented, however, appears slightly annoyed with questions and makes little eye contact with me.  She does answer questions  appropriately and expresses frustration with her year so far in regards to health issues "...it just keeps getting better and better..."  Attempted to provide some support, yet little response to this.  Will monitor emotional adjustment issues through her stay, however, anticipated to be relatively short and may benefit from counseling via [facility] upon d/c Recent Psychosocial Issues: Cancer diagnosis June 2013 Pyschiatric History: none Substance Abuse History: none  Patient / Family Perceptions, Expectations & Goals Pt/Family understanding of illness & functional limitations: pt with good understanding of her medical issues, infection and current medication regimen / therapy needs.   Premorbid pt/family roles/activities: Pt was relatively independent and "thought i was doing pretty good from the knee surgery" - independent at home and community (was driving herself to chemo) Anticipated changes in roles/activities/participation: May require some minimal assistance at home from time to time Pt/family expectations/goals: "i just want to get home"  [employer]: Other (Comment) ([facility]) Premorbid Home Care/DME Agencies: None Transportation available at discharge: yes Resource referrals recommended: Support group (specify) (via [facility])  Discharge Planning Living Arrangements: Spouse/significant other;Children Support Systems: Children;Spouse/significant other;Parent Type of Residence: Private residence Insurance Resources: Media planner (specify) (BCBS of [place]) Financial Resources: Employment Financial Screen Referred: No Living Expenses: Database administrator Management: Patient Do you have any problems obtaining your medications?: No Home Management: pt and family shared Patient/Family Preliminary Plans: return home with husband and daughter Social Work Anticipated Follow Up Needs: HH/OP;Support Group Expected length of stay: 5  days  Clinical Impression Alert and oriented woman here after knee infection and deconditioned.  Recent cancer diagnosis (june 2013).  Good family support.  Will monitor emotional adjustment through her anticipated short LOS.  Laurence Folz 03/12/2012, 3:31 PM

## 2012-03-12 NOTE — Progress Notes (Signed)
Occupational Therapy Session Note  Patient Details  Name: Julie Ross MRN: [id number] Date of Birth: [birth year]  Today's Date: 03/12/2012 Time: 1345-1430 Time Calculation (min): 45 min  Short Term Goals: Week 1:  OT Short Term Goal 1 (Week 1): Patient will perform LB dressing with Min Assist with AE. OT Short Term Goal 2 (Week 1): Patient will perform LB bathing with Min Assist with AE. OT Short Term Goal 3 (Week 1): Patient will perform toileting with Supervision. OT Short Term Goal 4 (Week 1): Patient will perform toilet transfer with Supervision. OT Short Term Goal 5 (Week 1): Patient will increase Bil UE strength and endurance to increase functional performance during sit to stands.  Skilled Therapeutic Interventions/Progress Updates:  Upon entering room patient seated on edge of bed with RN present after toilet transfer & toileting. Patient expressed how anxious she is to get home. Therapist discussed some d/c planning with patient and explained importance of safety. Patient then attempted to donn bilateral socks and shoes. While completing activity patient with complaints of feeling dizzy and having tunnel vision; took a rest break and patient stated she started feeling a little better. Patient then transferred into w/c and therapist propelled patient -> ADL apartment for simulated shower stall transfer using simulated shower block. Patient performed transfer with close supervision. Patient then started to ambulate back to room using rolling walker, patient ambulated ~20 feet and stated she needed to sit down secondary to tunnel vision and dizziness. Therapist assisted patient back to room and had patient transfer into recliner to elevate bilateral LEs. Patient continued to feel bad and stated it was hard for her to breathe, notified RN immediately.   Precautions:  Precautions Precautions: Fall Precaution Comments: NO KNEE ROM L  Required Braces or Orthoses: Knee Immobilizer -  Left Knee Immobilizer - Left: On at all times Restrictions Weight Bearing Restrictions: No LLE Weight Bearing: Weight bearing as tolerated  See FIM for current functional status  Therapy/Group: Individual Therapy  Iven Earnhart 03/12/2012, 3:27 PM

## 2012-03-12 NOTE — Progress Notes (Signed)
Patient with complaints of feeling strange---voices sound like they are in another room/tunnel vision earlier this afternoon is better. Husband in room.  Discussed issues with elevated BP. Medications resume this am but need to continue to watch renal status. Baclofen, cymbalta and neurontin still on hold. Husband relays that patient more active here--"slept a lot at home" and is glad that neuro-seadative meds on hold. Patient denies any spastic/neuropathic symptoms currently.  Does not feel current symptoms c/w MS exacerbation. Will check CT head and monitor.  Moves all four. Speech/cognition baseline. Symptoms likely multifactorial. Low dose neurontin resumed for now.

## 2012-03-13 ENCOUNTER — Inpatient Hospital Stay (HOSPITAL_COMMUNITY): Payer: BC Managed Care – PPO

## 2012-03-13 ENCOUNTER — Encounter (HOSPITAL_COMMUNITY): Payer: BC Managed Care – PPO | Admitting: Occupational Therapy

## 2012-03-13 ENCOUNTER — Inpatient Hospital Stay (HOSPITAL_COMMUNITY): Payer: BC Managed Care – PPO | Admitting: Physical Therapy

## 2012-03-13 LAB — CULTURE, BLOOD (ROUTINE X 2)
Culture: NO GROWTH
Culture: NO GROWTH

## 2012-03-13 LAB — RENAL FUNCTION PANEL
Albumin: 2.6 g/dL — ABNORMAL LOW (ref 3.5–5.2)
BUN: 15 mg/dL (ref 6–23)
CO2: 29 mEq/L (ref 19–32)
Calcium: 9.1 mg/dL (ref 8.4–10.5)
Chloride: 104 mEq/L (ref 96–112)
Creatinine, Ser: 1.91 mg/dL — ABNORMAL HIGH (ref 0.50–1.10)
GFR calc Af Amer: 33 mL/min — ABNORMAL LOW (ref >90)
GFR calc non Af Amer: 29 mL/min — ABNORMAL LOW (ref >90)
Glucose, Bld: 116 mg/dL — ABNORMAL HIGH (ref 70–99)
Phosphorus: 5 mg/dL — ABNORMAL HIGH (ref 2.3–4.6)
Potassium: 3.6 mEq/L (ref 3.5–5.1)
Sodium: 144 mEq/L (ref 135–145)

## 2012-03-13 LAB — PROTIME-INR
INR: 2.04 — ABNORMAL HIGH (ref 0.00–1.49)
Prothrombin Time: 22.2 s — ABNORMAL HIGH (ref 11.6–15.2)

## 2012-03-13 MED ORDER — LOSARTAN POTASSIUM 50 MG PO TABS
100.0000 mg | ORAL_TABLET | Freq: Every day | ORAL | Status: DC
Start: 2012-03-13 — End: 2012-03-16
  Administered 2012-03-13 – 2012-03-16 (×4): 100 mg via ORAL
  Filled 2012-03-13 (×5): qty 2

## 2012-03-13 MED ORDER — ALTEPLASE 2 MG IJ SOLR
2.0000 mg | Freq: Once | INTRAMUSCULAR | Status: AC
  Administered 2012-03-13: 2 mg
  Filled 2012-03-13: qty 2

## 2012-03-13 MED ORDER — HYDROCHLOROTHIAZIDE 25 MG PO TABS
25.0000 mg | ORAL_TABLET | Freq: Every day | ORAL | Status: DC
  Administered 2012-03-13 – 2012-03-16 (×4): 25 mg via ORAL
  Filled 2012-03-13 (×5): qty 1

## 2012-03-13 MED ORDER — WARFARIN SODIUM 7.5 MG PO TABS
7.5000 mg | ORAL_TABLET | Freq: Once | ORAL | Status: AC
  Administered 2012-03-13: 7.5 mg via ORAL
  Filled 2012-03-13: qty 1

## 2012-03-13 MED ORDER — ALTEPLASE 2 MG IJ SOLR
2.0000 mg | Freq: Once | INTRAMUSCULAR | Status: DC
  Filled 2012-03-13: qty 2

## 2012-03-13 NOTE — Patient Care Conference (Signed)
Inpatient RehabilitationTeam Conference Note Date: 03/13/2012   Time: 2:25 PM    Patient Name: Julie Ross      Medical Record Number: [id number]  Date of Birth: Dec 03, 1960 Sex: Female         Room/Bed: 4145/4145-01 Payor Info: Payor: BLUE CROSS BLUE SHIELD  Plan: BCBS [place] PPO  Product Type: *No Product type*     Admitting Diagnosis: deconditioned,sepsis  Admit Date/Time:  03/08/2012  6:21 PM Admission Comments: No comment available   Primary Diagnosis:  Septic joint of left knee joint Principal Problem: Septic joint of left knee joint  Patient Active Problem List   Diagnosis Date Noted  . Septic joint of left knee joint 03/09/2012  . Hypokalemia 03/04/2012  . Hyponatremia 03/04/2012  . Bacteremia 03/04/2012  . Infected prosthetic knee joint 03/02/2012  . Seroma, postoperative 02/06/2012  . Dehydration 02/03/2012  . Multiple sclerosis 01/23/2012  . Cancer of upper-outer quadrant of female breast 11/22/2011  . Osteoarthritis of both knees 08/15/2011  . Osteoarthritis of left knee 08/15/2011    Expected Discharge Date: Expected Discharge Date: 03/16/12  Team Members Present: Physician: Dr. Faith Rogue Social Worker Present: Amada Jupiter, LCSW Nurse Present: Other (comment) Berta Minor, RN) PT Present: [name], PT OT Present: Edwin Cap, Loistine Chance, OT Other (Discipline and Name): Tora Duck,  PPS Coordinator     Current Status/Progress Goal Weekly Team Focus  Medical   infected knee, ms, nausea and bp issues  pain and bp management  positive reinforcement   Bowel/Bladder   Continent of bowel and bladder  continent of bowel and bladder  Monitor   Swallow/Nutrition/ Hydration             ADL's   overall supervision except max assist with LB dressing  overall supervision -> mod I  ADL retraining with use of AE and overall activity tolerance/endurance   Mobility   supervision/steady A  mod I/S overall  endurance, fam ed, strengthening, stairs     Communication             Safety/Cognition/ Behavioral Observations            Pain   Tylenol 325-650mg           Skin              Rehab Goals Patient on target to meet rehab goals: Yes *See Interdisciplinary Assessment and Plan and progress notes for long and short-term goals  Barriers to Discharge: ms and cognitive deficits    Possible Resolutions to Barriers:  safety training, caregiver ed    Discharge Planning/Teaching Needs:  home with family - husband to take approx one week off to provide any assistance needed      Team Discussion:  CT neg; poor memory and carry over, however, doing well with therapies and functional activities  Revisions to Treatment Plan:  None   Continued Need for Acute Rehabilitation Level of Care: The patient requires daily medical management by a physician with specialized training in physical medicine and rehabilitation for the following conditions: Daily direction of a multidisciplinary physical rehabilitation program to ensure safe treatment while eliciting the highest outcome that is of practical value to the patient.: Yes Daily medical management of patient stability for increased activity during participation in an intensive rehabilitation regime.: Yes Daily analysis of laboratory values and/or radiology reports with any subsequent need for medication adjustment of medical intervention for : Neurological problems;Other;Post surgical problems  [name] 03/13/2012, 2:37 PM

## 2012-03-13 NOTE — Progress Notes (Signed)
Physical Therapy Session Note  Patient Details  Name: Julie Ross MRN: [id number] Date of Birth: [birth year]  Today's Date: 03/13/2012 Time: 1100-1130 Time Calculation (min): 30 min  Applied compression hose to bilateral LE's to aid with edema and pt practicing putting on KI on LLE with min A. Bed mobility and transfer training in the room, mod I/S overall with RW. Gait with RW on unit with focus on endurance, stride length, posture, and monitoring energy level > 150' and on carpeted surface to simulate home environment. Positioned in recliner with LE's elevated at end of session.      Therapy Documentation Precautions:  Precautions Precautions: Fall Precaution Comments: NO KNEE ROM L  Required Braces or Orthoses: Knee Immobilizer - Left Knee Immobilizer - Left: On at all times Restrictions Weight Bearing Restrictions: Yes LLE Weight Bearing: Weight bearing as tolerated Pain: Denies pain.   Locomotion : Ambulation Ambulation/Gait Assistance: 5: Supervision   See FIM for current functional status  Therapy/Group: Individual Therapy  [name] [facility] 03/13/2012, 12:14 PM

## 2012-03-13 NOTE — Progress Notes (Signed)
Physical Therapy Session Note  Patient Details  Name: Julie Ross MRN: [id number] Date of Birth: 06/26/1960  Today's Date: 03/13/2012 Time: 4098-1191 Time Calculation (min): 46 min  Skilled Therapeutic Interventions/Progress Updates:   This session focused on leg strengthening left (without ROM to knee), gait, dynamic balance, and working out how she will get into her car with the KI on her left leg.  She will need continued practice with car transfers as she was a little unsteady trying to get out of the car.  See details below.    Therapy Documentation Precautions:  Precautions Precautions: Fall Precaution Comments: NO KNEE ROM L  Required Braces or Orthoses: Knee Immobilizer - Left Knee Immobilizer - Left: On at all times Restrictions Weight Bearing Restrictions: Yes LLE Weight Bearing: Weight bearing as tolerated Vital Signs: Therapy Vitals Temp: 98.7 F (37.1 C) Temp src: Oral Pulse Rate: 85  Resp: 18  BP: 187/101 mmHg (catapress given; see MAR) Patient Position, if appropriate: Sitting Oxygen Therapy SpO2: 98 % O2 Device: None (Room air) Pain: Pain Assessment Pain Assessment: 0-10 Pain Score: 0-No pain Faces Pain Scale: No hurt Mobility: Transfers Sit to Stand: 6: Modified independent (Device/Increase time);With upper extremity assist;From bed;From chair/3-in-1;With armrests Locomotion : Ambulation Ambulation: Yes Ambulation/Gait Assistance: 5: Supervision Ambulation Distance (Feet): 300 Feet Assistive device: Rolling walker Ambulation/Gait Assistance Details: Good step through gait pattern.        Balance: Dynamic Standing Balance Dynamic Standing - Level of Assistance: 5: Stand by assistance Exercises: General Exercises - Lower Extremity Ankle Circles/Pumps: AROM;Both;20 reps;Supine Total Joint Exercises Quad Sets: AROM;Left;10 reps;Supine Hip ABduction/ADduction: AROM;15 reps;Left;Supine;Sidelying (abduction only) Straight Leg Raises: AROM;Left;15  reps;Supine Other Treatments: Treatments Therapeutic Activity: Standing ball toss and bounce pass working on standing balance without RW, also stanidng b-ball shoot.  Working on standing balance and standing endurance.  Car transfers supervision with moderate verbal cueing and strategy for getting into/out of the car with KI on left leg.    See FIM for current functional status  Therapy/Group: Individual Therapy  Lurena Joiner B. Arlan Birks, PT, DPT [phone]   03/13/2012, 4:52 PM

## 2012-03-13 NOTE — Progress Notes (Signed)
INFECTIOUS DISEASE PROGRESS NOTE  ID: Julie Ross is a 51 y.o. female with   Principal Problem:  *Septic joint of left knee joint Active Problems:  Multiple sclerosis  Bacteremia  Subjective: C/o pruritis  Abtx:  Anti-infectives     Start     Dose/Rate Route Frequency Ordered Stop   03/10/12 2200   ceFAZolin (ANCEF) IVPB 1 g/50 mL premix        1 g 100 mL/hr over 30 Minutes Intravenous 3 times per day 03/10/12 1320     03/08/12 2100   ceFAZolin (ANCEF) IVPB 1 g/50 mL premix  Status:  Discontinued        1 g 100 mL/hr over 30 Minutes Intravenous Every 12 hours 03/08/12 1846 03/10/12 1320          Medications:  Scheduled:   . bacitracin   Topical BID  .  ceFAZolin (ANCEF) IV  1 g Intravenous Q8H  . feeding supplement  237 mL Oral BID BM  . gabapentin  100 mg Oral TID  . hydrochlorothiazide  25 mg Oral Daily  . hydrocortisone cream   Topical BID  . losartan  100 mg Oral Daily  . ondansetron  4 mg Oral QID   Or  . ondansetron (ZOFRAN) IV  4 mg Intravenous QID  . pantoprazole  80 mg Oral Q1200  . sodium phosphate  1 enema Rectal Once  . warfarin  7.5 mg Oral ONCE-1800  . warfarin  7.5 mg Oral ONCE-1800  . Warfarin - Pharmacist Dosing Inpatient   Does not apply q1800  . DISCONTD: furosemide  20 mg Oral BID  . DISCONTD: losartan  50 mg Oral Daily  . DISCONTD: traZODone  75 mg Oral QHS    Objective: Vital signs in last 24 hours: Temp:  [98.2 F (36.8 C)] 98.2 F (36.8 C) (09/24 0500) Pulse Rate:  [71-88] 78  (09/24 0500) Resp:  [19] 19  (09/24 0500) BP: (171-184)/(92-111) 171/92 mmHg (09/24 0500) SpO2:  [93 %] 93 % (09/24 0500) Weight:  [97.1 kg (214 lb 1.1 oz)-99.2 kg (218 lb 11.1 oz)] 99.2 kg (218 lb 11.1 oz) (09/24 0500)   General appearance: alert, cooperative and no distress Skin: fine papulr lesions on back  Lab Results  Basename 03/13/12 0500 03/12/12 0530  WBC -- --  HGB -- --  HCT -- --  NA 144 141  K 3.6 4.0  CL 104 104  CO2 29 27    BUN 15 16  CREATININE 1.91* 1.77*  GLU -- --   Liver Panel  Basename 03/13/12 0500 03/12/12 0530  PROT -- --  ALBUMIN 2.6* 2.6*  AST -- --  ALT -- --  ALKPHOS -- --  BILITOT -- --  BILIDIR -- --  IBILI -- --   Sedimentation Rate No results found for this basename: ESRSEDRATE in the last 72 hours C-Reactive Protein No results found for this basename: CRP:2 in the last 72 hours  Microbiology: Recent Results (from the past 240 hour(s))  URINE CULTURE     Status: Normal   Collection Time   03/04/12  3:50 AM      Component Value Range Status Comment   Specimen Description URINE, CATHETERIZED   Final    Special Requests PATIENT ON FOLLOWING VANCOMYCIN   Final    Culture  Setup Time 03/04/2012 11:16   Final    Colony Count NO GROWTH   Final    Culture NO GROWTH   Final  Report Status 03/05/2012 FINAL   Final   CULTURE, BLOOD (ROUTINE X 2)     Status: Normal   Collection Time   03/07/12 12:30 PM      Component Value Range Status Comment   Specimen Description BLOOD RIGHT HAND   Final    Special Requests BOTTLES DRAWN AEROBIC ONLY 8CC   Final    Culture  Setup Time 03/07/2012 20:02   Final    Culture NO GROWTH 5 DAYS   Final    Report Status 03/13/2012 FINAL   Final   CULTURE, BLOOD (ROUTINE X 2)     Status: Normal   Collection Time   03/07/12 12:40 PM      Component Value Range Status Comment   Specimen Description BLOOD RIGHT HAND   Final    Special Requests BOTTLES DRAWN AEROBIC ONLY [facility]   Final    Culture  Setup Time 03/07/2012 20:01   Final    Culture NO GROWTH 5 DAYS   Final    Report Status 03/13/2012 FINAL   Final     Studies/Results: Ct Head Wo Contrast  03/12/2012  *RADIOLOGY REPORT*  Clinical Data: 51 year old female with confusion, dizziness.  CT HEAD WITHOUT CONTRAST  Technique:  Contiguous axial images were obtained from the base of the skull through the vertex without contrast.  Comparison: 03/05/2012. Brain MRI report [facility] 01/26/2010  (no images available).   .  Findings: Visualized paranasal sinuses and mastoids are clear.  No acute osseous abnormality identified.  Visualized orbits and scalp soft tissues are within normal limits.  Stable calcified mildly prominent pineal gland.  No definite mass effect on adjacent structures. This was described on the 01/26/2010 report.  No other intracranial mass lesion.  No midline shift.  No ventriculomegaly. No evidence of cortically based acute infarction identified.  No acute intracranial hemorrhage identified.  No suspicious intracranial vascular hyperdensity.  IMPRESSION: 1.  No acute intracranial abnormality. 2.  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   Original Report Authenticated By: Harley Hallmark, M.D.      Assessment/Plan: Septic Arthritis/Osteomyelitis (staph aureus)  L TKR  Staph aureus (MSSA) bacteremia 2/2  TEE (-)  ARF, resolving  Breast Cancer (L) T1 N1 (stage II)  MS  Constipation  Total days of antibiotics 12 (vancomycin --> cubicin/rifampin--> ancef/rifampin--> ancef)  Would-  Few remaining good IV choices. Could consider change to carbapenem? Would have overly broad coverage.  Continued pruritis. Cont to try benadryl, hydroxizine, topicals Watch Cr, slightly up today Will need long term anbx (~ 6 weeks) as well as chemo. Could place [facility], then transition to a new port.  Available if questions    Johny Sax Infectious Diseases [phone] 03/13/2012, 10:25 AM   LOS: 5 days

## 2012-03-13 NOTE — Progress Notes (Signed)
Physical Therapy Session Note  Patient Details  Name: Julie Ross MRN: [id number] Date of Birth: [birth year]  Today's Date: 03/13/2012 Time: 4098-1191 Time Calculation (min): 45 min  Treatment focused on family education with pt's husband for car transfers, up/down flight of stairs, energy conservation techniques, HEP, basic transfers, and gait. Pt completing all tasks at supervision to modified independent level using RW. Nustep for ROM and generalized endurance/strengthening on level 3 x 5 minutes.  Therapy Documentation Precautions:  Precautions Precautions: Fall Required Braces or Orthoses: Knee Immobilizer - Left Knee Immobilizer - Left: During gait Restrictions Weight Bearing Restrictions: Yes LLE Weight Bearing: Weight bearing as tolerated Pain: No complaints   Locomotion : Ambulation Ambulation/Gait Assistance: 5: Supervision   See FIM for current functional status  Therapy/Group: Individual Therapy  [name] [facility] 03/13/2012, 4:16 PM

## 2012-03-13 NOTE — Progress Notes (Signed)
Occupational Therapy Session Note  Patient Details  Name: Julie Ross MRN: [id number] Date of Birth: Nov 01, 1960  Today's Date: 03/13/2012 Time: 0900-1000 Time Calculation (min): 60 min  Short Term Goals: Week 1:  OT Short Term Goal 1 (Week 1): Patient will perform LB dressing with Min Assist with AE. OT Short Term Goal 2 (Week 1): Patient will perform LB bathing with Min Assist with AE. OT Short Term Goal 3 (Week 1): Patient will perform toileting with Supervision. OT Short Term Goal 4 (Week 1): Patient will perform toilet transfer with Supervision. OT Short Term Goal 5 (Week 1): Patient will increase Bil UE strength and endurance to increase functional performance during sit to stands.  Skilled Therapeutic Interventions/Progress Updates:  Patient found supine in bed. Engaged in bed mobility and functional ambulation around room to gather necessary items for bathing & dressing. Patient then performed ADL at sink level in sit-> stand position. Focused skilled intervention on overall activity tolerance/endurance, UB/LB bathing & dressing, grooming tasks at sink, use of AE prn, sit/stands, and dynamic standing balance/tolerance/endurance. At end of session left patient seated in w/c at sink to finish ADL; notified RN that patient was sitting at sink alone.   Precautions:  Precautions Precautions: Fall Precaution Comments: NO KNEE ROM L  Required Braces or Orthoses: Knee Immobilizer - Left Knee Immobilizer - Left: On at all times Restrictions Weight Bearing Restrictions: Yes LLE Weight Bearing: Weight bearing as tolerated  See FIM for current functional status  Therapy/Group: Individual Therapy  Makenzie Vittorio 03/13/2012, 10:23 AM

## 2012-03-13 NOTE — Progress Notes (Addendum)
Patient ID: Julie Ross, female   DOB: Dec 04, 1960, 51 y.o.   MRN: [id number] Subjective/Complaints: No complaints except for feeling a little foggy when she awoke last night. Denies feeling "fuzzy or flushed" yesterday--at least doesn't remember No SOB, No diaphoresis, no L arm pain A 12 point review of systems has been performed and if not noted above is otherwise negative.   Objective: Vital Signs: Blood pressure 171/92, pulse 78, temperature 98.2 F (36.8 C), temperature source Oral, resp. rate 19, height 6' (1.829 m), weight 99.2 kg (218 lb 11.1 oz), last menstrual period 12/05/2011, SpO2 93.00%. Ct Head Wo Contrast  03/12/2012  *RADIOLOGY REPORT*  Clinical Data: 51 year old female with confusion, dizziness.  CT HEAD WITHOUT CONTRAST  Technique:  Contiguous axial images were obtained from the base of the skull through the vertex without contrast.  Comparison: 03/05/2012. Brain MRI report [facility] 01/26/2010 (no images available).   .  Findings: Visualized paranasal sinuses and mastoids are clear.  No acute osseous abnormality identified.  Visualized orbits and scalp soft tissues are within normal limits.  Stable calcified mildly prominent pineal gland.  No definite mass effect on adjacent structures. This was described on the 01/26/2010 report.  No other intracranial mass lesion.  No midline shift.  No ventriculomegaly. No evidence of cortically based acute infarction identified.  No acute intracranial hemorrhage identified.  No suspicious intracranial vascular hyperdensity.  IMPRESSION: 1.  No acute intracranial abnormality. 2.  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   Original Report Authenticated By: Harley Hallmark, M.D.    No results found for this basename: WBC:2,HGB:2,HCT:2,PLT:2 in the last 72 hours  Basename 03/13/12 0500 03/12/12 0530  NA 144 141  K 3.6 4.0  CL 104 104  CO2 29 27  GLUCOSE 116* 111*  BUN 15 16  CREATININE 1.91* 1.77*  CALCIUM 9.1  9.2   CBG (last 3)  No results found for this basename: GLUCAP:3 in the last 72 hours  Wt Readings from Last 3 Encounters:  03/13/12 99.2 kg (218 lb 11.1 oz)  03/02/12 88.451 kg (195 lb)  03/02/12 88.451 kg (195 lb)    Physical Exam:  Constitutional: She is oriented to person, place, and time. She appears well-developed and well-nourished.  HENT:  Head: Normocephalic and atraumatic.  Eyes: Pupils are equal, round, and reactive to light.  Neck: Normal range of motion.  Cardiovascular: Normal rate and regular rhythm.  Pulmonary/Chest: Effort normal and breath sounds normal. She exhibits tenderness (at prior [facility] site. dry dressing noted. ). Left side pectoralis origen tender to palpation Abdominal: Soft. She exhibits no distension. Bowel sounds are decreased. There is no tenderness.  Musculoskeletal: She exhibits edema (bilateral hands.).  Dry dressing left knee with KI in place. Wound CDI with staples  Neurological: She is alert and oriented to person, place, and time.  Soft spoken. Follows commands without difficulty.  Skin:  Petechial rash on BUE.  Rash on back stable  Psych: affect extremely flat, mild anxiety  Assessment/Plan: 1. Functional deficits secondary to Septic L knee s/p poly exchange which require 3+ hours per day of interdisciplinary therapy in a comprehensive inpatient rehab setting. Physiatrist is providing close team supervision and 24 hour management of active medical problems listed below. Physiatrist and rehab team continue to assess barriers to discharge/monitor patient progress toward functional and medical goals.  CT of head is negative for acute changes   FIM: FIM - Bathing Bathing Steps Patient Completed: Chest;Right Arm;Left Arm;Abdomen;Right upper leg;Left upper leg;Left  lower leg (including foot);Right lower leg (including foot) Bathing: 4: Steadying assist  FIM - Upper Body Dressing/Undressing Upper body dressing/undressing steps patient  completed: Thread/unthread right sleeve of pullover shirt/dresss;Thread/unthread left sleeve of pullover shirt/dress;Put head through opening of pull over shirt/dress;Pull shirt over trunk Upper body dressing/undressing: 5: Supervision: Safety issues/verbal cues FIM - Lower Body Dressing/Undressing Lower body dressing/undressing steps patient completed: Thread/unthread right underwear leg;Thread/unthread left underwear leg;Pull underwear up/down;Thread/unthread right pants leg;Thread/unthread left pants leg;Pull pants up/down Lower body dressing/undressing: 2: Max-Patient completed 25-49% of tasks (gripper socks only)  FIM - Toileting Toileting steps completed by patient: Performs perineal hygiene Toileting: 0: Activity did not occur  FIM - Archivist Transfers: 0-Activity did not occur  FIM - Banker Devices: Therapist, occupational: 5: Supine > Sit: Supervision (verbal cues/safety issues);4: Bed > Chair or W/C: Min A (steadying Pt. > 75%)  FIM - Locomotion: Wheelchair Locomotion: Wheelchair: 1: Total Assistance/staff pushes wheelchair (Pt<25%) FIM - Locomotion: Ambulation Locomotion: Ambulation Assistive Devices: Designer, industrial/product Ambulation/Gait Assistance: 4: Min assist Locomotion: Ambulation: 2: Travels 50 - 149 ft with supervision/safety issues  Comprehension Comprehension Mode: Auditory Comprehension: 6-Follows complex conversation/direction: With extra time/assistive device  Expression Expression Mode: Verbal Expression: 6-Expresses complex ideas: With extra time/assistive device  Social Interaction Social Interaction: 5-Interacts appropriately 90% of the time - Needs monitoring or encouragement for participation or interaction.  Problem Solving Problem Solving: 6-Solves complex problems: With extra time  Memory Memory: 6-More than reasonable amt of time  Medical Problem List and Plan:  1. DVT  Prophylaxis/Anticoagulation: Pharmaceutical: Coumadin  2. Pain Management: continue prn tylenol./ultram  3. Mood: occasional confusion and anxiety. i think a lot of this is effects from her MS. Poor day to day memory. Tries to cover up at times. Poor memory tends to fuel anxiety at times. 4. Neuropsych: This patient is capable of making decisions on his/her own behalf.  5. ABLA: monitor with recheck in am. Will avoid iron supplements due to ongoing chronic GI complaints  6. Staph bacteremia: ancef-?alternatives  -vistaril prn-watch cognitive side effects 7. Chronic nausea: Likely exacerbated by antibiotics as well as constipation issues. Added probiotic to help with GI symptoms. ID to discontinue rifampin to see if this will help GI symptoms.  8. Constipation:   9. AKI/HTN: resolving. Continue foley for strict I/O. Continue daily renal panel. I think she is volume overloaded. BP's elevated also. Increase losartan to 100. Change lasix to hctz 25mg  qday  -needs daily weights- weight stable to decreased from admit  -clonidine for bp spikes  -consider nephrology follow up  -serial bmets  -minimal swelling on exam 10.  MSK CP, will tx with topical agent and heat 11. Anxiety start prn xanax, poor sleep not pain related increased trazodone 12. Bladder dc foley, voiding trial LOS (Days) 5 A FACE TO FACE EVALUATION WAS PERFORMED  Burnis Halling T 03/13/2012, 7:08 AM

## 2012-03-13 NOTE — Progress Notes (Signed)
[facility] Individual Statement of Services  Patient Name:  Julie Ross  Date:  03/13/2012  Welcome to the [facility].  Our goal is to provide you with an individualized program based on your diagnosis and situation, designed to meet your specific needs.  With this comprehensive rehabilitation program, you will be expected to participate in at least 3 hours of rehabilitation therapies Monday-Friday, with modified therapy programming on the weekends.  Your rehabilitation program will include the following services:  Physical Therapy (PT), Occupational Therapy (OT), 24 hour per day rehabilitation nursing, Therapeutic Recreaction (TR), Case Management (Social Worker), Rehabilitation Medicine, Nutrition Services and Pharmacy Services  Weekly team conferences will be held on Tuesdays to discuss your progress.  Your  Social Worker will talk with you frequently to get your input and to update you on team discussions.  Team conferences with you and your family in attendance may also be held.  Expected length of stay: 7-10 days  Overall anticipated outcome: supervision to modified independent  Depending on your progress and recovery, your program may change.  Your  Social Worker will coordinate services and will keep you informed of any changes.  Your  Social Worker's name and contact numbers are listed  below.  The following services may also be recommended but are not provided by the [facility]:   Driving Evaluations  Home Health Rehabiltiation Services  Outpatient Rehabilitatation [facility]  Vocational Rehabilitation   Arrangements will be made to provide these services after discharge if needed.  Arrangements include referral to agencies that provide these services.  Your insurance has been verified to be:  BC[employer] PPO Your primary doctor is:  Dr. Quentin Angst  Pertinent information will be shared with your doctor and your insurance  company.  Social Worker:  [place], [place] [phone] or [phone]  Information discussed with and copy given to patient by: Amada Jupiter, 03/13/2012, 1:45 PM

## 2012-03-13 NOTE — Progress Notes (Signed)
ANTICOAGULATION CONSULT NOTE - Follow Up Consult  Pharmacy Consult:  Coumadin Indication: VTE prophylaxis  No Known Allergies  Patient Measurements: Height: 6' (182.9 cm) Weight: 218 lb 11.1 oz (99.2 kg) IBW/kg (Calculated) : 73.1    Vital Signs: Temp: 98.2 F (36.8 C) (09/24 0500) Temp src: Oral (09/24 0500) BP: 171/92 mmHg (09/24 0500) Pulse Rate: 78  (09/24 0500)  Labs:  Basename 03/13/12 0500 03/12/12 0530 03/11/12 1038  HGB -- -- --  HCT -- -- --  PLT -- -- --  APTT -- -- --  LABPROT 22.2* 21.8* 23.3*  INR 2.04* 1.99* 2.18*  HEPARINUNFRC -- -- --  CREATININE 1.91* 1.77* 1.81*  CKTOTAL -- -- --  CKMB -- -- --  TROPONINI -- -- --    Estimated Creatinine Clearance: 44.9 ml/min (by C-G formula based on Cr of 1.91).     Assessment:  79 YOF, s/p I&D of knee for septic arthritis, on Coumadin for VTE prophylaxis for 4 weeks (started 9/13).  INR therapeutic again; no bleeding reported.    Goal of Therapy:  INR 2-3 Monitor platelets by anticoagulation protocol: Yes    Plan:  - Coumadin 7.5mg  PO today - Daily PT/INR - F/U vitals     Culley Hedeen D. Laney Potash, PharmD, BCPS Pager:  [phone] 03/13/2012, 8:30 AM

## 2012-03-14 ENCOUNTER — Encounter: Payer: BC Managed Care – PPO | Admitting: Physical Therapy

## 2012-03-14 ENCOUNTER — Inpatient Hospital Stay (HOSPITAL_COMMUNITY): Payer: BC Managed Care – PPO

## 2012-03-14 ENCOUNTER — Inpatient Hospital Stay (HOSPITAL_COMMUNITY): Payer: BC Managed Care – PPO | Admitting: Physical Therapy

## 2012-03-14 ENCOUNTER — Encounter (HOSPITAL_COMMUNITY): Payer: BC Managed Care – PPO | Admitting: Occupational Therapy

## 2012-03-14 DIAGNOSIS — R5381 Other malaise: Secondary | ICD-10-CM

## 2012-03-14 DIAGNOSIS — M009 Pyogenic arthritis, unspecified: Secondary | ICD-10-CM

## 2012-03-14 DIAGNOSIS — Z5189 Encounter for other specified aftercare: Secondary | ICD-10-CM

## 2012-03-14 LAB — CBC
HCT: 25.3 % — ABNORMAL LOW (ref 36.0–46.0)
Hemoglobin: 8.3 g/dL — ABNORMAL LOW (ref 12.0–15.0)
MCH: 28.2 pg (ref 26.0–34.0)
MCHC: 32.8 g/dL (ref 30.0–36.0)
MCV: 86.1 fL (ref 78.0–100.0)
Platelets: 575 10*3/uL — ABNORMAL HIGH (ref 150–400)
RBC: 2.94 MIL/uL — ABNORMAL LOW (ref 3.87–5.11)
RDW: 19.1 % — ABNORMAL HIGH (ref 11.5–15.5)
WBC: 7 10*3/uL (ref 4.0–10.5)

## 2012-03-14 LAB — RENAL FUNCTION PANEL
Albumin: 2.8 g/dL — ABNORMAL LOW (ref 3.5–5.2)
BUN: 18 mg/dL (ref 6–23)
CO2: 31 mEq/L (ref 19–32)
Calcium: 9.3 mg/dL (ref 8.4–10.5)
Chloride: 99 mEq/L (ref 96–112)
Creatinine, Ser: 1.88 mg/dL — ABNORMAL HIGH (ref 0.50–1.10)
GFR calc Af Amer: 34 mL/min — ABNORMAL LOW (ref >90)
GFR calc non Af Amer: 29 mL/min — ABNORMAL LOW (ref >90)
Glucose, Bld: 94 mg/dL (ref 70–99)
Phosphorus: 5 mg/dL — ABNORMAL HIGH (ref 2.3–4.6)
Potassium: 4 mEq/L (ref 3.5–5.1)
Sodium: 141 mEq/L (ref 135–145)

## 2012-03-14 LAB — PROTIME-INR
INR: 1.85 — ABNORMAL HIGH (ref 0.00–1.49)
Prothrombin Time: 20.7 seconds — ABNORMAL HIGH (ref 11.6–15.2)

## 2012-03-14 MED ORDER — ATENOLOL 12.5 MG HALF TABLET
12.5000 mg | ORAL_TABLET | Freq: Every day | ORAL | Status: DC
  Administered 2012-03-14 – 2012-03-16 (×3): 12.5 mg via ORAL
  Filled 2012-03-14 (×4): qty 1

## 2012-03-14 MED ORDER — WARFARIN SODIUM 10 MG PO TABS
10.0000 mg | ORAL_TABLET | Freq: Once | ORAL | Status: AC
  Administered 2012-03-14: 10 mg via ORAL
  Filled 2012-03-14: qty 1

## 2012-03-14 NOTE — Progress Notes (Signed)
Social Work Patient ID: Julie Ross, female   DOB: Sep 04, 1960, 51 y.o.   MRN: [id number]  Met yesterday afternoon with patient to review team conference - agreeable with targeted d/c 03/16/12 at mod i goals.  Pt also agreeable with f/u HH and DME recommended.    Dalasia Predmore

## 2012-03-14 NOTE — Progress Notes (Signed)
Occupational Therapy Session Note  Patient Details  Name: Julie Ross MRN: [id number] Date of Birth: September 16, 1960  Today's Date: 03/14/2012 Time: 7829-5621 Time Calculation (min): 46 min  Short Term Goals: Week 1:  OT Short Term Goal 1 (Week 1): Patient will perform LB dressing with Min Assist with AE. OT Short Term Goal 2 (Week 1): Patient will perform LB bathing with Min Assist with AE. OT Short Term Goal 3 (Week 1): Patient will perform toileting with Supervision. OT Short Term Goal 4 (Week 1): Patient will perform toilet transfer with Supervision. OT Short Term Goal 5 (Week 1): Patient will increase Bil UE strength and endurance to increase functional performance during sit to stands.  Skilled Therapeutic Interventions/Progress Updates:    Pt seen for individual OT treatment session with focus on functional ambulation to gather clothing/items for bathing & dressing. Patient then performed ADL at sink level in sit-> stand position. Focused skilled intervention on overall activity tolerance/endurance, problem solving, Bathing/dressing, grooming tasks at sink, toileting and toilet transfers w/ RW. Pt was offered use of AE prn, but declined. Pt required occasional VC's for problem solving/activity planning especially during LE dressing tasks noted. At end of session left patient seated in chair in room w/ family present. Call bell and phone in reach.  Therapy Documentation Precautions:  Precautions Precautions: Fall Precaution Comments: knee ROM 0-90 degree AROM/PROM per CIR order-checked with Dr. Riley Kill who confirmed he saw no reason not to perform some ROM on left knee.   Required Braces or Orthoses: Knee Immobilizer - Left Knee Immobilizer - Left: On when out of bed or walking Restrictions Weight Bearing Restrictions: Yes LLE Weight Bearing: Weight bearing as tolerated     Pain: Pt reports fatigue and soreness Left and Right LE's; Not rated RN made aware, repositioned and ice  applied bilateral knees       See FIM for current functional status  Therapy/Group: Individual Therapy  Alm Bustard 03/14/2012, 1:44 PM

## 2012-03-14 NOTE — Progress Notes (Signed)
Physical Therapy Session Note  Patient Details  Name: Julie Ross MRN: [id number] Date of Birth: 11-29-1960  Today's Date: 03/14/2012 Time: 1478-2956 Time Calculation (min): 58 min  Soreness in knees at end of session - notified RN for pain medication and ice applied.  Focus on dynamic gait and balance activities using RW/quad cane for obstacle negotiation including turns, stepping over objects, and walking on compliant surface; standing without UE support to throw, catch, and bounce ball on tile floor and compliant surface. Completed all with S. Stair training for home entry and mobility step to pattern. Towards the end of the session, pt became hot in the gym and started feeling lightheaded (due to MS), seated and given ice water and cool wash cloth to rest and cool down. BP = 182/102 and HR = 71. After resting returned to room to rest in recliner and re-applied ice packs. Notified RN of elevated BP and for pain medication.  Therapy Documentation Precautions:  Precautions Precautions: Fall Precaution Comments: knee ROM 0-90 degree AROM/PROM per CIR order-checked with Dr. Riley Kill who confirmed he saw no reason not to perform some ROM on left knee.   Required Braces or Orthoses: Knee Immobilizer - Left Knee Immobilizer - Left: On when out of bed or walking Restrictions Weight Bearing Restrictions: Yes LLE Weight Bearing: Weight bearing as tolerated   See FIM for current functional status  Therapy/Group: Individual Therapy  [name] [facility] 03/14/2012, 3:42 PM

## 2012-03-14 NOTE — Progress Notes (Addendum)
Patient ID: Julie Ross, female   DOB: 08-26-1960, 51 y.o.   MRN: [id number] Subjective/Complaints: Still some itching otherwise feels well.  No SOB, No diaphoresis, no L arm pain A 12 point review of systems has been performed and if not noted above is otherwise negative.   Objective: Vital Signs: Blood pressure 181/91, pulse 82, temperature 97.6 F (36.4 C), temperature source Oral, resp. rate 19, height 6' (1.829 m), weight 99.2 kg (218 lb 11.1 oz), last menstrual period 12/05/2011, SpO2 93.00%. Ct Head Wo Contrast  03/12/2012  *RADIOLOGY REPORT*  Clinical Data: 51 year old female with confusion, dizziness.  CT HEAD WITHOUT CONTRAST  Technique:  Contiguous axial images were obtained from the base of the skull through the vertex without contrast.  Comparison: 03/05/2012. Brain MRI report [facility] 01/26/2010 (no images available).   .  Findings: Visualized paranasal sinuses and mastoids are clear.  No acute osseous abnormality identified.  Visualized orbits and scalp soft tissues are within normal limits.  Stable calcified mildly prominent pineal gland.  No definite mass effect on adjacent structures. This was described on the 01/26/2010 report.  No other intracranial mass lesion.  No midline shift.  No ventriculomegaly. No evidence of cortically based acute infarction identified.  No acute intracranial hemorrhage identified.  No suspicious intracranial vascular hyperdensity.  IMPRESSION: 1.  No acute intracranial abnormality. 2.  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   Original Report Authenticated By: Harley Hallmark, M.D.     Basename 03/14/12 0600  WBC 7.0  HGB 8.3*  HCT 25.3*  PLT 575*    Basename 03/14/12 0600 03/13/12 0500  NA 141 144  K 4.0 3.6  CL 99 104  CO2 31 29  GLUCOSE 94 116*  BUN 18 15  CREATININE 1.88* 1.91*  CALCIUM 9.3 9.1   CBG (last 3)  No results found for this basename: GLUCAP:3 in the last 72 hours  Wt Readings from Last 3  Encounters:  03/13/12 99.2 kg (218 lb 11.1 oz)  03/02/12 88.451 kg (195 lb)  03/02/12 88.451 kg (195 lb)    Physical Exam:  Constitutional: She is oriented to person, place, and time. She appears well-developed and well-nourished.  HENT:  Head: Normocephalic and atraumatic.  Eyes: Pupils are equal, round, and reactive to light.  Neck: Normal range of motion.  Cardiovascular: Normal rate and regular rhythm.  Pulmonary/Chest: Effort normal and breath sounds normal. She exhibits tenderness (at prior [facility] site. dry dressing noted. ). Left side pectoralis origen tender to palpation Abdominal: Soft. She exhibits no distension. Bowel sounds are decreased. There is no tenderness.  Musculoskeletal: She exhibits edema (bilateral hands.).  Dry dressing left knee with KI in place. Wound CDI with staples  Neurological: She is alert and oriented to person, place, and time.  Soft spoken. Follows commands without difficulty.  Skin:  Petechial rash on BUE.  Rash on back stable  Psych: affect extremely flat, mild anxiety  Assessment/Plan: 1. Functional deficits secondary to Septic L knee s/p poly exchange which require 3+ hours per day of interdisciplinary therapy in a comprehensive inpatient rehab setting. Physiatrist is providing close team supervision and 24 hour management of active medical problems listed below. Physiatrist and rehab team continue to assess barriers to discharge/monitor patient progress toward functional and medical goals.  CT of head is negative for acute changes   FIM: FIM - Bathing Bathing Steps Patient Completed: Chest;Right Arm;Left Arm;Abdomen;Front perineal area;Buttocks;Right upper leg;Left upper leg;Right lower leg (including foot) Bathing: 5: Supervision:  Safety issues/verbal cues  FIM - Upper Body Dressing/Undressing Upper body dressing/undressing steps patient completed: Thread/unthread right sleeve of pullover shirt/dresss;Thread/unthread left sleeve of pullover  shirt/dress;Put head through opening of pull over shirt/dress;Pull shirt over trunk Upper body dressing/undressing: 5: Set-up assist to: Obtain clothing/put away FIM - Lower Body Dressing/Undressing Lower body dressing/undressing steps patient completed: Thread/unthread right underwear leg;Thread/unthread left underwear leg;Pull underwear up/down;Thread/unthread right pants leg;Thread/unthread left pants leg;Pull pants up/down Lower body dressing/undressing: 2: Max-Patient completed 25-49% of tasks (gripper socks only)  FIM - Toileting Toileting steps completed by patient: Performs perineal hygiene Toileting: 0: Activity did not occur  FIM - Archivist Transfers: 0-Activity did not occur  FIM - Banker Devices: Therapist, occupational: 6: Supine > Sit: No assist;5: Bed > Chair or W/C: Supervision (verbal cues/safety issues)  FIM - Locomotion: Wheelchair Locomotion: Wheelchair: 0: Activity did not occur FIM - Locomotion: Ambulation Locomotion: Ambulation Assistive Devices: Walker - Rolling;Orthosis Ambulation/Gait Assistance: 5: Supervision Locomotion: Ambulation: 5: Travels 150 ft or more with supervision/safety issues  Comprehension Comprehension Mode: Auditory Comprehension: 6-Follows complex conversation/direction: With extra time/assistive device  Expression Expression Mode: Verbal Expression: 6-Expresses complex ideas: With extra time/assistive device  Social Interaction Social Interaction: 7-Interacts appropriately with others - No medications needed.  Problem Solving Problem Solving: 7-Solves complex problems: Recognizes & self-corrects  Memory Memory: 6-More than reasonable amt of time  Medical Problem List and Plan:  1. DVT Prophylaxis/Anticoagulation: Pharmaceutical: Coumadin  2. Pain Management: continue prn tylenol./ultram  3. Mood: occasional confusion and anxiety. i think a lot of this is effects from her  MS. Poor day to day memory. Tries to cover up at times. Poor memory tends to fuel anxiety at times. 4. Neuropsych: This patient is capable of making decisions on his/her own behalf.  5. ABLA: count stable at 8.3--recheck prior to dc 6. Staph bacteremia: ancef-?alternatives  -vistaril prn-watch cognitive side effects 7. Chronic nausea: Likely exacerbated by antibiotics as well as constipation issues. Added probiotic to help with GI symptoms. ID to discontinue rifampin to see if this will help GI symptoms.  8. Constipation:   9. AKI/HTN: resolving. Continue foley for strict I/O. Continue daily renal panel. I think she is volume overloaded. BP's elevated also. Increased losartan to 100. Changed lasix to hctz 25mg  qday  -needs daily weights- weight stable to decreased from admit  -clonidine for bp spikes  -consider nephrology follow up  -serial bmets  -minimal swelling on exam  -add low dose beta blocker to further assist bp 10.  MSK CP, will tx with topical agent and heat 11. Anxiety start prn xanax, poor sleep not pain related increased trazodone 12. Bladder dc foley, voiding trial LOS (Days) 6 A FACE TO FACE EVALUATION WAS PERFORMED  Seward Coran T 03/14/2012, 8:20 AM

## 2012-03-14 NOTE — Progress Notes (Signed)
ANTICOAGULATION CONSULT NOTE - Follow Up Consult  Pharmacy Consult:  Coumadin Indication: VTE prophylaxis  No Known Allergies  Patient Measurements: Height: 6' (182.9 cm) Weight: 218 lb 11.1 oz (99.2 kg) IBW/kg (Calculated) : 73.1    Vital Signs: Temp: 97.6 F (36.4 C) (09/25 0500) Temp src: Oral (09/25 0500) BP: 181/91 mmHg (09/25 0500) Pulse Rate: 82  (09/25 0500)  Labs:  Basename 03/14/12 0600 03/13/12 0500 03/12/12 0530  HGB 8.3* -- --  HCT 25.3* -- --  PLT 575* -- --  APTT -- -- --  LABPROT 20.7* 22.2* 21.8*  INR 1.85* 2.04* 1.99*  HEPARINUNFRC -- -- --  CREATININE 1.88* 1.91* 1.77*  CKTOTAL -- -- --  CKMB -- -- --  TROPONINI -- -- --    Estimated Creatinine Clearance: 45.6 ml/min (by C-G formula based on Cr of 1.88).     Assessment:  Julie Ross, s/p I&D of knee for septic arthritis, on Coumadin for VTE prophylaxis for 4 weeks (started 9/13).  INR has been fluctuating, now subtherapeutic.  No bleeding documented.   Goal of Therapy:  INR 2-3 Monitor platelets by anticoagulation protocol: Yes    Plan:  - Coumadin 10mg  PO today - Daily PT / INR - F/U vitals     Dorine Duffey D. Laney Potash, PharmD, BCPS Pager:  [phone] 03/14/2012, 8:38 AM

## 2012-03-14 NOTE — Progress Notes (Signed)
Physical Therapy Session Note  Patient Details  Name: Julie Ross MRN: [id number] Date of Birth: 1960/07/25  Today's Date: 03/14/2012 Time: 4098-1191 Time Calculation (min): 43 min  Focused on community mobility with RW (pt hesitant to try cane outdoors yet) on uneven surfaces, negotiating doors, and discussed energy conservation techniques. Proprioception and balance strategies on compliant surface and Balance Zone to reach across midline and place horseshoes on basketball hoop to simulate functional task with 1 UE support and no UE support. Gait training with quad cane in controlled environment with overall S, except 2 LOB with steady A to recover. Discussed use of RW in community until more comfortable with cane and that she can continue to work with cane at home with HHPT. Pt in agreement.      Therapy Documentation Precautions:  Precautions Precautions: Fall Precaution Comments: knee ROM 0-90 degree AROM/PROM per CIR order-checked with Dr. Riley Kill who confirmed he saw no reason not to perform some ROM on left knee.   Required Braces or Orthoses: Knee Immobilizer - Left Knee Immobilizer - Left: On when out of bed or walking Restrictions Weight Bearing Restrictions: Yes LLE Weight Bearing: Weight bearing as tolerated   Pain: Sore in knees - ice applied at end of session.  See FIM for current functional status  Therapy/Group: Individual Therapy  [name] [facility] 03/14/2012, 10:00 AM

## 2012-03-14 NOTE — Progress Notes (Signed)
Physical Therapy Session Note  Patient Details  Name: Julie Ross MRN: [id number] Date of Birth: Jan 18, 1961  Today's Date: 03/14/2012 Time: 4098-1191 Time Calculation (min): 43 min   Skilled Therapeutic Interventions/Progress Updates:   This session focused on reinforcing education re: car transfers and stair training.  We also introduced a quad cane due to gait is going so well with RW and pt reports that she has a 3 pronged cane at home.  We performed supine TKA exercises including active and active assistive ROM to left knee (confirmed with Dr. Riley Kill that this is ok-conflicting notes in the chart from acute care to inpatient rehab re: ROM).  It would be good to do NuStep for ROM again next session as well as continued practice of stairs and gait with quad cane.     Therapy Documentation Precautions:  Precautions Precautions: Fall Precaution Comments: knee ROM 0-90 degree AROM/PROM per CIR order-checked with Dr. Riley Kill who confirmed he saw no reason not to perform some ROM on left knee.   Required Braces or Orthoses: Knee Immobilizer - Left Knee Immobilizer - Left: On when out of bed or walking Restrictions Weight Bearing Restrictions: Yes LLE Weight Bearing: Weight bearing as tolerated    Pain: Pain Assessment Pain Assessment: 0-10 Pain Score:   8 (after PT session) Pain Type: Surgical pain Pain Location: Knee Pain Orientation: Left Pain Intervention(s): RN made aware;Repositioned;Cold applied;Ambulation/increased activity Mobility: Transfers Sit to Stand: 6: Modified independent (Device/Increase time);With upper extremity assist;From bed;From chair/3-in-1;With armrests Stand to Sit: 6: Modified independent (Device/Increase time);With upper extremity assist;To bed;To chair/3-in-1;With armrests;Without upper extremity assist Locomotion : Ambulation Ambulation: Yes Ambulation/Gait Assistance: 5: Supervision;4: Min assist (supervision with RW, min assist with quad  cane) Ambulation Distance (Feet): 300 Feet Assistive device: Small based quad cane;Rolling walker Ambulation/Gait Assistance Details: RW for 150' with supervision, quad cane for 150' min assist and verbal cueing needed.   Stairs / Additional Locomotion Stairs: Yes Stairs Assistance: 5: Supervision Stairs Assistance Details: Verbal cues for sequencing Stairs Assistance Details (indicate cue type and reason): vision for safety Stair Management Technique: One rail Right;Forwards;Step to pattern Number of Stairs: 9  Height of Stairs: 6      Exercises: General Exercises - Lower Extremity Ankle Circles/Pumps: AROM;Both;20 reps;Supine Total Joint Exercises Quad Sets: AROM;Both;15 reps;Supine Heel Slides: AROM;Left;15 reps;Supine;Seated Hip ABduction/ADduction: AROM;Left;15 reps;Supine Straight Leg Raises: AROM;Left;5 reps;Supine Long Arc Quad: AROM;15 reps;Seated Other Treatments: Treatments Therapeutic Activity: Car transfer preformed again with supervision for safety and verbal cues for safety.    See FIM for current functional status  Therapy/Group: Individual Therapy  Lurena Joiner B. Blen Ransome, PT, DPT [phone]   03/14/2012, 9:09 AM

## 2012-03-15 ENCOUNTER — Inpatient Hospital Stay (HOSPITAL_COMMUNITY): Payer: BC Managed Care – PPO

## 2012-03-15 ENCOUNTER — Inpatient Hospital Stay (HOSPITAL_COMMUNITY): Payer: BC Managed Care – PPO | Admitting: Occupational Therapy

## 2012-03-15 ENCOUNTER — Encounter (HOSPITAL_COMMUNITY): Payer: BC Managed Care – PPO | Admitting: Occupational Therapy

## 2012-03-15 ENCOUNTER — Inpatient Hospital Stay (HOSPITAL_COMMUNITY): Payer: BC Managed Care – PPO | Admitting: *Deleted

## 2012-03-15 LAB — RENAL FUNCTION PANEL
Albumin: 2.7 g/dL — ABNORMAL LOW (ref 3.5–5.2)
BUN: 19 mg/dL (ref 6–23)
CO2: 33 mEq/L — ABNORMAL HIGH (ref 19–32)
Calcium: 9.1 mg/dL (ref 8.4–10.5)
Chloride: 97 mEq/L (ref 96–112)
Creatinine, Ser: 1.92 mg/dL — ABNORMAL HIGH (ref 0.50–1.10)
GFR calc Af Amer: 33 mL/min — ABNORMAL LOW (ref >90)
GFR calc non Af Amer: 29 mL/min — ABNORMAL LOW (ref >90)
Glucose, Bld: 101 mg/dL — ABNORMAL HIGH (ref 70–99)
Phosphorus: 4.9 mg/dL — ABNORMAL HIGH (ref 2.3–4.6)
Potassium: 3.8 mEq/L (ref 3.5–5.1)
Sodium: 139 mEq/L (ref 135–145)

## 2012-03-15 LAB — PROTIME-INR
INR: 1.69 — ABNORMAL HIGH (ref 0.00–1.49)
Prothrombin Time: 19.3 seconds — ABNORMAL HIGH (ref 11.6–15.2)

## 2012-03-15 MED ORDER — WARFARIN SODIUM 10 MG PO TABS
10.0000 mg | ORAL_TABLET | Freq: Once | ORAL | Status: AC
  Administered 2012-03-15: 10 mg via ORAL
  Filled 2012-03-15: qty 1

## 2012-03-15 NOTE — Progress Notes (Signed)
Occupational Therapy Session Note  Patient Details  Name: Julie Ross MRN: [id number] Date of Birth: 04-20-1961  Today's Date: 03/15/2012 Time: 1030-1100 Time Calculation (min): 30 min  Short Term Goals: Week 1:  OT Short Term Goal 1 (Week 1): Patient will perform LB dressing with Min Assist with AE. OT Short Term Goal 2 (Week 1): Patient will perform LB bathing with Min Assist with AE. OT Short Term Goal 3 (Week 1): Patient will perform toileting with Supervision. OT Short Term Goal 4 (Week 1): Patient will perform toilet transfer with Supervision. OT Short Term Goal 5 (Week 1): Patient will increase Bil UE strength and endurance to increase functional performance during sit to stands.  Skilled Therapeutic Interventions/Progress Updates:    Focused on activity tolerance and dynamic standing balance during functional activities- changing sheets on ADL bed. Pt performed home management task requiring dynamic movements with supervision and no overt signs of fatigue ~15 min before requesting rest break; ambulated to and from ADL apartment using RW Mod I. Placed ice packs on pt L knee due to c/o soreness.  Therapy Documentation Precautions:  Precautions Precautions: Fall Precaution Comments: KI discontinued 9/26 per patient report from Dr. Luiz Blare Required Braces or Orthoses: Knee Immobilizer - Left Knee Immobilizer - Left: On when out of bed or walking Restrictions Weight Bearing Restrictions: Yes LLE Weight Bearing: Weight bearing as tolerated Pain: Soreness in L knee that is on-going and nursing aware  See FIM for current functional status  Therapy/Group: Individual Therapy  Jackelyn Poling 03/15/2012, 3:11 PM

## 2012-03-15 NOTE — Progress Notes (Signed)
Patient ID: Julie Ross, female   DOB: 04-24-1961, 51 y.o.   MRN: [id number] Subjective/Complaints: Still some itching otherwise feels well.  No SOB, No diaphoresis, no L arm pain A 12 point review of systems has been performed and if not noted above is otherwise negative.   Objective: Vital Signs: Blood pressure 158/70, pulse 72, temperature 98.6 F (37 C), temperature source Oral, resp. rate 20, height 6' (1.829 m), weight 96 kg (211 lb 10.3 oz), last menstrual period 12/05/2011, SpO2 97.00%. No results found.  Basename 03/14/12 0600  WBC 7.0  HGB 8.3*  HCT 25.3*  PLT 575*    Basename 03/15/12 0657 03/14/12 0600  NA 139 141  K 3.8 4.0  CL 97 99  CO2 33* 31  GLUCOSE 101* 94  BUN 19 18  CREATININE 1.92* 1.88*  CALCIUM 9.1 9.3   CBG (last 3)  No results found for this basename: GLUCAP:3 in the last 72 hours  Wt Readings from Last 3 Encounters:  03/14/12 96 kg (211 lb 10.3 oz)  03/02/12 88.451 kg (195 lb)  03/02/12 88.451 kg (195 lb)    Physical Exam:  Constitutional: She is oriented to person, place, and time. She appears well-developed and well-nourished.  HENT:  Head: Normocephalic and atraumatic.  Eyes: Pupils are equal, round, and reactive to light.  Neck: Normal range of motion.  Cardiovascular: Normal rate and regular rhythm.  Pulmonary/Chest: Effort normal and breath sounds normal. She exhibits tenderness (at prior [facility] site. dry dressing noted. ). Left side pectoralis origen tender to palpation Abdominal: Soft. She exhibits no distension. Bowel sounds are decreased. There is no tenderness.  Musculoskeletal: She exhibits edema (bilateral hands.).  Dry dressing left knee with KI in place. Wound CDI with staples  Neurological: She is alert and oriented to person, place, and time.  Soft spoken. Follows commands without difficulty.  Skin:  Petechial rash on BUE.  Rash on back stable  Psych: affect extremely flat, mild anxiety  Assessment/Plan: 1. Functional  deficits secondary to Septic L knee s/p poly exchange which require 3+ hours per day of interdisciplinary therapy in a comprehensive inpatient rehab setting. Physiatrist is providing close team supervision and 24 hour management of active medical problems listed below. Physiatrist and rehab team continue to assess barriers to discharge/monitor patient progress toward functional and medical goals.   staples out tomorrow   FIM: FIM - Bathing Bathing Steps Patient Completed: Chest;Right Arm;Left Arm;Abdomen;Front perineal area;Buttocks;Left upper leg;Right upper leg;Right lower leg (including foot) Bathing: 5: Supervision: Safety issues/verbal cues  FIM - Upper Body Dressing/Undressing Upper body dressing/undressing steps patient completed: Thread/unthread right sleeve of pullover shirt/dresss;Thread/unthread left sleeve of pullover shirt/dress;Put head through opening of pull over shirt/dress;Pull shirt over trunk Upper body dressing/undressing: 5: Set-up assist to: Obtain clothing/put away FIM - Lower Body Dressing/Undressing Lower body dressing/undressing steps patient completed: Thread/unthread right underwear leg;Thread/unthread left underwear leg;Pull underwear up/down;Thread/unthread right pants leg;Thread/unthread left pants leg;Pull pants up/down;Don/Doff right shoe;Don/Doff left shoe;Fasten/unfasten right shoe;Fasten/unfasten left shoe Lower body dressing/undressing: 3: Mod-Patient completed 50-74% of tasks  FIM - Toileting Toileting steps completed by patient: Adjust clothing prior to toileting;Performs perineal hygiene;Adjust clothing after toileting Toileting: 5: Supervision: Safety issues/verbal cues  FIM - Toilet Transfers Toilet Transfers: 5-To toilet/BSC: Supervision (verbal cues/safety issues);5-From toilet/BSC: Supervision (verbal cues/safety issues)  FIM - Bed/Chair Transfer Bed/Chair Transfer Assistive Devices: Therapist, occupational: 6: Assistive device: no  helper  FIM - Locomotion: Wheelchair Locomotion: Wheelchair: 1: Total Assistance/staff pushes wheelchair (Pt<25%) FIM - Locomotion:  Ambulation Locomotion: Ambulation Assistive Devices: Walker - Rolling;Orthosis;Occupational hygienist Ambulation/Gait Assistance: 5: Supervision;4: Min assist (supervision with RW, min assist with quad cane) Locomotion: Ambulation: 4: Travels 150 ft or more with minimal assistance (Pt.>75%)  Comprehension Comprehension Mode: Auditory Comprehension: 6-Follows complex conversation/direction: With extra time/assistive device  Expression Expression Mode: Verbal Expression: 6-Expresses complex ideas: With extra time/assistive device  Social Interaction Social Interaction: 6-Interacts appropriately with others with medication or extra time (anti-anxiety, antidepressant).  Problem Solving Problem Solving: 6-Solves complex problems: With extra time  Memory Memory: 6-More than reasonable amt of time  Medical Problem List and Plan:  1. DVT Prophylaxis/Anticoagulation: Pharmaceutical: Coumadin  2. Pain Management: continue prn tylenol./ultram  3. Mood: occasional confusion and anxiety. i think a lot of this is effects from her MS. Poor day to day memory. Tries to cover up at times. Poor memory tends to fuel anxiety at times. 4. Neuropsych: This patient is capable of making decisions on his/her own behalf.  5. ABLA: count stable at 8.3--recheck prior to dc 6. Staph bacteremia: ancef-?alternatives  -hhrn for abx  -vistaril prn-watch cognitive side effects 7. Chronic nausea: Likely exacerbated by antibiotics as well as constipation issues. Added probiotic to help with GI symptoms. ID to discontinue rifampin to see if this will help GI symptoms.  8. Constipation:   9. AKI/HTN: resolving. Continue foley for strict I/O. Continue daily renal panel. I think she is volume overloaded. BP's elevated also. Increased losartan to 100. Changed lasix to hctz 25mg  qday  -needs daily  weights- weight stable to decreased from admit  -clonidine for bp spikes  -consider nephrology follow up  -serial bmets  -minimal swelling on exam  -added low dose atenolol which helped 10.  MSK CP, will tx with topical agent and heat 11. Anxiety start prn xanax, poor sleep not pain related increased trazodone 12. Bladder voiding fairly well LOS (Days) 7 A FACE TO FACE EVALUATION WAS PERFORMED  Ece Cumberland T 03/15/2012, 8:19 AM

## 2012-03-15 NOTE — Progress Notes (Signed)
Subjective: Pt doing much better today   Objective: Vital signs in last 24 hours: Temp:  [98.4 F (36.9 C)-98.6 F (37 C)] 98.6 F (37 C) (09/26 0516) Pulse Rate:  [72-76] 72  (09/26 0516) Resp:  [18-20] 20  (09/26 0516) BP: (158-166)/(70-81) 158/70 mmHg (09/26 0516) SpO2:  [97 %-99 %] 97 % (09/26 0516)  Intake/Output from previous day: 09/25 0701 - 09/26 0700 In: 600 [P.O.:600] Out: -  Intake/Output this shift:     Basename 03/14/12 0600  HGB 8.3*    Basename 03/14/12 0600  WBC 7.0  RBC 2.94*  HCT 25.3*  PLT 575*    Basename 03/14/12 0600 03/13/12 0500  NA 141 144  K 4.0 3.6  CL 99 104  CO2 31 29  BUN 18 15  CREATININE 1.88* 1.91*  GLUCOSE 94 116*  CALCIUM 9.3 9.1    Basename 03/15/12 0657 03/14/12 0600  LABPT -- --  INR 1.69* 1.85*    Neurologically intact ABD soft Neurovascular intact Sensation intact distally No cellulitis present Compartment soft rom 5-45 deg  Assessment/Plan: S/p tkr infection  Revision Needs improved rom May d/c knee immobilizer when up if tolerates D/c staples tomorrow and steri-strip   Tyonna Talerico L 03/15/2012, 8:01 AM

## 2012-03-15 NOTE — Progress Notes (Signed)
ANTICOAGULATION CONSULT NOTE - Follow Up Consult  Pharmacy Consult:  Coumadin Indication: VTE prophylaxis  No Known Allergies  Patient Measurements: Height: 6' (182.9 cm) Weight: 211 lb 10.3 oz (96 kg) IBW/kg (Calculated) : 73.1    Vital Signs: Temp: 98.6 F (37 C) (09/26 0516) Temp src: Oral (09/26 0516) BP: 158/70 mmHg (09/26 0516) Pulse Rate: 72  (09/26 0516)  Labs:  Basename 03/15/12 0657 03/14/12 0600 03/13/12 0500  HGB -- 8.3* --  HCT -- 25.3* --  PLT -- 575* --  APTT -- -- --  LABPROT 19.3* 20.7* 22.2*  INR 1.69* 1.85* 2.04*  HEPARINUNFRC -- -- --  CREATININE 1.92* 1.88* 1.91*  CKTOTAL -- -- --  CKMB -- -- --  TROPONINI -- -- --    Estimated Creatinine Clearance: 44 ml/min (by C-G formula based on Cr of 1.92).     Assessment:  Julie Ross, s/p I&D of knee for septic arthritis, on Coumadin for VTE prophylaxis for 4 weeks (started 9/13).  Unsure why INR has decreased with increased dosing.  No new interacting med.  No bleeding documented.   Goal of Therapy:  INR 2-3 Monitor platelets by anticoagulation protocol: Yes    Plan:  - Repeat Coumadin 10mg  PO today - Daily PT / INR - F/U vitals    Kati Riggenbach D. Laney Potash, PharmD, BCPS Pager:  [phone] 03/15/2012, 9:52 AM

## 2012-03-15 NOTE — Progress Notes (Signed)
Physical Therapy Discharge Summary  Patient Details  Name: Julie Ross MRN: [id number] Date of Birth: 19-Dec-1960  Today's Date: 03/15/2012 Time: 7829-5621 Time Calculation (min): 56 min Individual therapy Treatment focused on home environment gait with pt's personal cane from home mod I in ADL apartment. Tub bench transfer simulated and educated on technique. Also demonstrated to pt's husband and educated on placement in the bathroom as well as therapist adjusted bench to fit appropriate side. Measurements taken for knee ROM and Nustep on level 5 x 10 minutes for strengthening, endurance, and increased ROM to L knee. Overall pt ready for d/c and no further questions from pt or husband. Ice packs applied to knee for decreased swelling and pain management at end of session. Pt reports doing well without KI today and no issues.  Patient has met 8 of 8 long term goals due to improved activity tolerance, increased strength, increased range of motion and decreased pain.  Patient to discharge at an ambulatory level Modified Independent with RW (community and longer distances) and cane (indoors)  Patient's husband is independent to provide the necessary supervision as needed assistance at discharge.  Reasons goals not met: all goals met  Recommendation:  Patient will benefit from ongoing skilled PT services in home health setting to continue to advance safe functional mobility, address ongoing impairments in ROM, strength, endurance, energy conservation techniques, and minimize fall risk.  Equipment: RW  Reasons for discharge: treatment goals met and discharge from hospital  Patient/family agrees with progress made and goals achieved: Yes  PT Discharge Precautions/Restrictions Precautions Precautions: Fall Precaution Comments: KI discontinued 9/26 per patient report from Dr. Luiz Blare Restrictions Weight Bearing Restrictions: Yes LLE Weight Bearing: Weight bearing as  tolerated  Vision/Perception  Vision - History Baseline Vision: Wears glasses all the time Patient Visual Report: No change from baseline Vision - Assessment Eye Alignment: Within Functional Limits Perception Perception: Within Functional Limits Praxis Praxis: Intact  Cognition Overall Cognitive Status: Appears within functional limits for tasks assessed Arousal/Alertness: Awake/alert Orientation Level: Oriented X4 Memory: Appears intact Awareness: Appears intact Problem Solving: Appears intact Safety/Judgment: Appears intact Comments: Patient reports some memory loss, but patient is [facility] for functional tasks Sensation Sensation Light Touch: Appears Intact Coordination Gross Motor Movements are Fluid and Coordinated: Yes Fine Motor Movements are Fluid and Coordinated: Yes Motor  Motor Motor: Within Functional Limits     Trunk/Postural Assessment  Cervical Assessment Cervical Assessment: Within Functional Limits Thoracic Assessment Thoracic Assessment: Within Functional Limits Lumbar Assessment Lumbar Assessment: Within Functional Limits  Balance Static Sitting Balance Static Sitting - Level of Assistance: 7: Independent Static Standing Balance Static Standing - Level of Assistance: 6: Modified independent (Device/Increase time) Dynamic Standing Balance Dynamic Standing - Level of Assistance: 6: Modified independent (Device/Increase time) Extremity Assessment  RUE Assessment RUE Assessment: Within Functional Limits LUE Assessment LUE Assessment: Within Functional Limits RLE Assessment RLE Assessment: Within Functional Limits LLE Assessment LLE Assessment: Exceptions to [facility] (seated knee flex 57*, supine 45 flex and -13 ext) LLE Strength LLE Overall Strength Comments: strength grossly 3+/5  See FIM for current functional status  [name] [facility] 03/15/2012, 2:49 PM

## 2012-03-15 NOTE — Progress Notes (Addendum)
Physical Therapy Session Note  Patient Details  Name: Julie Ross MRN: [id number] Date of Birth: 1961/03/07  Today's Date: 03/15/2012 Time: 0900-1000 Time Calculation (min): 60 min  Short Term Goals: Week 1:   see d/c goals  Skilled Therapeutic Interventions/Progress Updates:    self care- education on lymphedema prevention, exercise after masectomy as pt had OP appt to begin oncology rehab but had to cancel d/t issue with knee  Exercise- mobility without KI on per new order to increase strength and stability of knee- pt tolerated without problem  Therapy Documentation Precautions: per ortho MD, weak KI only if needed now and pt tolerated entire PT session without it, no increased pain or knee buckling Vital Signs: 85 with activity   Pain:  premedicated, "mild knee pain", no intervention needed Locomotion : Ambulation Ambulation/Gait Assistance: 6: Modified independent (Device/Increase time) Ambulation Distance (Feet): 300 Feet Assistive device: Rolling walker Ambulation/Gait Assistance Details: without KI Stairs / Additional Locomotion Stairs: Yes Stairs Assistance: 6: Modified independent (Device/Increase time) Stairs Assistance Details (indicate cue type and reason): step to without KI Number of Stairs: 12      Community ambulation on incline, decline, uneven brick 300, 150 rw mod I with decreased cadence no KI   Exercises: Cardiovascular Exercises Upper Body Ergometer: 5 min standing backwards to stregthen upper back and ROM d/t recent masectomy :    See FIM for current functional status  Therapy/Group: Individual Therapy  Michaelene Song 03/15/2012, 10:02 AM

## 2012-03-15 NOTE — Progress Notes (Signed)
Occupational Therapy Session Note & Discharge Summary  Patient Details  Name: Julie Ross MRN: [id number] Date of Birth: 06-24-1960  Today's Date: 03/15/2012  SESSION NOTE Time: 4098-1191 Time Calculation (min): 50 min Individual Therapy Patient with complaints of 7/10 pain towards end of session, RN aware & donned ice to [facility] Patient engaged in ADL retraining at sink level at modified independent level. Patient gathered all necessary items for ADL and performed ADL in sit->stand position at sink. Patient used AE prn to help increase independence. At end of session patient with 7/10 pain in left knee, therapist donned ice pack to help decrease pain.  DISCHARGE SUMMARY Patient has met 8 of 9 long term goals due to improved activity tolerance, improved balance, postural control, ability to compensate for deficits, functional use of  LEFT lower extremity, improved attention, improved awareness and improved coordination.  Patient to discharge at overall Modified Independent level.  Patient's care partner is independent to provide the necessary intermittent supervision prn assistance at discharge.    Reasons goals not met: Patient did not meet simple meal prep goal at this time. Patient states she normally goes out to eat and does not like to cook.   Recommendation: No additional occupational therapy recommended at this time.  Equipment: Tub Advertising copywriter  Reasons for discharge: treatment goals met and discharge from hospital  Patient/family agrees with progress made and goals achieved: Yes  Precautions/Restrictions  Precautions Precautions: Fall Precaution Comments: KI discontinued 9/26 per patient report from Dr. Luiz Blare Restrictions Weight Bearing Restrictions: Yes LLE Weight Bearing: Weight bearing as tolerated  Pain Pain Assessment Pain Assessment: 0-10 Pain Score:   7 Pain Type: Surgical pain;Acute pain Pain Location: Knee Pain Orientation: Left Pain Descriptors:  Aching Pain Intervention(s): RN made aware  ADL ADL Eating: Not assessed Grooming: Setup Where Assessed-Grooming: Sitting at sink Upper Body Bathing: Supervision/safety Where Assessed-Upper Body Bathing: Sitting at sink Lower Body Bathing: Maximal assistance Where Assessed-Lower Body Bathing: Sitting at sink Upper Body Dressing: Minimal assistance Where Assessed-Upper Body Dressing: Sitting at sink Lower Body Dressing: Maximal assistance Where Assessed-Lower Body Dressing: Standing at sink;Sitting at sink Toileting: Moderate assistance Where Assessed-Toileting: Teacher, adult education: Maximal Dentist Method: Art gallery manager: Not assessed  Vision/Perception  Vision - History Baseline Vision: Wears glasses all the time Patient Visual Report: No change from baseline Vision - Assessment Eye Alignment: Within Functional Limits Perception Perception: Within Functional Limits Praxis Praxis: Intact   Cognition Overall Cognitive Status: Appears within functional limits for tasks assessed Arousal/Alertness: Awake/alert Orientation Level: Oriented X4 Memory: Appears intact Awareness: Appears intact Problem Solving: Appears intact Safety/Judgment: Appears intact Comments: Patient reports some memory loss, but patient is [facility] for functional tasks  Sensation Sensation Light Touch: Appears Intact Coordination Gross Motor Movements are Fluid and Coordinated: Yes Fine Motor Movements are Fluid and Coordinated: Yes  Motor - See Discharge Navigator  Mobility - See Discharge Navigator  Trunk/Postural Assessment - See Discharge Navigator   Balance- See Discharge Navigator   Extremity/Trunk Assessment RUE Assessment RUE Assessment: Within Functional Limits LUE Assessment LUE Assessment: Within Functional Limits  See FIM for current functional status  Kinley Dozier 03/15/2012, 12:07 PM

## 2012-03-16 ENCOUNTER — Ambulatory Visit: Payer: BC Managed Care – PPO

## 2012-03-16 ENCOUNTER — Ambulatory Visit: Payer: BC Managed Care – PPO | Admitting: Oncology

## 2012-03-16 ENCOUNTER — Other Ambulatory Visit: Payer: BC Managed Care – PPO | Admitting: Lab

## 2012-03-16 DIAGNOSIS — M009 Pyogenic arthritis, unspecified: Secondary | ICD-10-CM

## 2012-03-16 DIAGNOSIS — R5381 Other malaise: Secondary | ICD-10-CM

## 2012-03-16 DIAGNOSIS — Z5189 Encounter for other specified aftercare: Secondary | ICD-10-CM

## 2012-03-16 LAB — CBC
HCT: 25.2 % — ABNORMAL LOW (ref 36.0–46.0)
Hemoglobin: 8.1 g/dL — ABNORMAL LOW (ref 12.0–15.0)
MCH: 27.6 pg (ref 26.0–34.0)
MCHC: 32.1 g/dL (ref 30.0–36.0)
MCV: 86 fL (ref 78.0–100.0)
Platelets: 480 10*3/uL — ABNORMAL HIGH (ref 150–400)
RBC: 2.93 MIL/uL — ABNORMAL LOW (ref 3.87–5.11)
RDW: 18.9 % — ABNORMAL HIGH (ref 11.5–15.5)
WBC: 6.6 10*3/uL (ref 4.0–10.5)

## 2012-03-16 LAB — RENAL FUNCTION PANEL
Albumin: 2.8 g/dL — ABNORMAL LOW (ref 3.5–5.2)
BUN: 19 mg/dL (ref 6–23)
CO2: 33 mEq/L — ABNORMAL HIGH (ref 19–32)
Calcium: 9.1 mg/dL (ref 8.4–10.5)
Chloride: 98 mEq/L (ref 96–112)
Creatinine, Ser: 1.87 mg/dL — ABNORMAL HIGH (ref 0.50–1.10)
GFR calc Af Amer: 34 mL/min — ABNORMAL LOW (ref >90)
GFR calc non Af Amer: 30 mL/min — ABNORMAL LOW (ref >90)
Glucose, Bld: 109 mg/dL — ABNORMAL HIGH (ref 70–99)
Phosphorus: 4.6 mg/dL (ref 2.3–4.6)
Potassium: 4 mEq/L (ref 3.5–5.1)
Sodium: 140 mEq/L (ref 135–145)

## 2012-03-16 LAB — PROTIME-INR
INR: 1.83 — ABNORMAL HIGH (ref 0.00–1.49)
Prothrombin Time: 20.5 seconds — ABNORMAL HIGH (ref 11.6–15.2)

## 2012-03-16 MED ORDER — WARFARIN SODIUM 5 MG PO TABS: 10.0000 mg | ORAL_TABLET | Freq: Every day | ORAL | Status: DC

## 2012-03-16 MED ORDER — ATENOLOL 25 MG PO TABS: 12.5000 mg | ORAL_TABLET | Freq: Every day | ORAL | Status: DC

## 2012-03-16 MED ORDER — WARFARIN SODIUM 10 MG PO TABS: 10.0000 mg | ORAL_TABLET | Freq: Once | ORAL | Status: DC

## 2012-03-16 MED ORDER — HYDROCORTISONE 1 % EX CREA: TOPICAL_CREAM | Freq: Two times a day (BID) | CUTANEOUS | Status: DC

## 2012-03-16 MED ORDER — ACETAMINOPHEN 325 MG PO TABS: 325.0000 mg | ORAL_TABLET | ORAL | Status: DC | PRN

## 2012-03-16 MED ORDER — BACITRACIN ZINC 500 UNIT/GM EX OINT: TOPICAL_OINTMENT | Freq: Two times a day (BID) | CUTANEOUS | Status: DC

## 2012-03-16 MED ORDER — HYDROCHLOROTHIAZIDE 25 MG PO TABS: 25.0000 mg | ORAL_TABLET | Freq: Every day | ORAL | Status: DC

## 2012-03-16 MED ORDER — CEFAZOLIN SODIUM 1-5 GM-% IV SOLN: 1.0000 g | Freq: Three times a day (TID) | INTRAVENOUS | Status: DC

## 2012-03-16 MED ORDER — TRAMADOL HCL 50 MG PO TABS: 50.0000 mg | ORAL_TABLET | ORAL | Status: DC | PRN

## 2012-03-16 MED ORDER — ONDANSETRON HCL 4 MG PO TABS: 4.0000 mg | ORAL_TABLET | Freq: Four times a day (QID) | ORAL | Status: DC

## 2012-03-16 MED ORDER — CAMPHOR-MENTHOL 0.5-0.5 % EX LOTN: TOPICAL_LOTION | CUTANEOUS | Status: DC | PRN

## 2012-03-16 MED ORDER — LOSARTAN POTASSIUM 100 MG PO TABS: 100.0000 mg | ORAL_TABLET | Freq: Every day | ORAL | Status: DC

## 2012-03-16 MED ORDER — ALPRAZOLAM 0.5 MG PO TABS: 0.5000 mg | ORAL_TABLET | Freq: Three times a day (TID) | ORAL | Status: DC | PRN

## 2012-03-16 MED ORDER — GABAPENTIN 100 MG PO CAPS: 100.0000 mg | ORAL_CAPSULE | Freq: Three times a day (TID) | ORAL | Status: DC

## 2012-03-16 MED ORDER — HYDROXYZINE HCL 25 MG PO TABS: 25.0000 mg | ORAL_TABLET | Freq: Three times a day (TID) | ORAL | Status: DC | PRN

## 2012-03-16 NOTE — Progress Notes (Signed)
ANTICOAGULATION CONSULT NOTE - Follow Up Consult  Pharmacy Consult:  Coumadin Indication: VTE prophylaxis  No Known Allergies  Patient Measurements: Height: 6' (182.9 cm) Weight: 205 lb 11 oz (93.3 kg) (patient weighed on stand up scales) IBW/kg (Calculated) : 73.1    Vital Signs: Temp: 97.8 F (36.6 C) (09/27 0543) Temp src: Oral (09/27 0543) BP: 151/71 mmHg (09/27 0543) Pulse Rate: 75  (09/27 0543)  Labs:  Basename 03/16/12 0500 03/15/12 0657 03/14/12 0600  HGB 8.1* -- 8.3*  HCT 25.2* -- 25.3*  PLT 480* -- 575*  APTT -- -- --  LABPROT 20.5* 19.3* 20.7*  INR 1.83* 1.69* 1.85*  HEPARINUNFRC -- -- --  CREATININE 1.87* 1.92* 1.88*  CKTOTAL -- -- --  CKMB -- -- --  TROPONINI -- -- --    Estimated Creatinine Clearance: 44.6 ml/min (by C-G formula based on Cr of 1.87).  Assessment: rehab s/p I&D of TKR for MSSA infection with bacteremia  51 YOF, s/p I&D of knee for septic arthritis, on Coumadin for VTE prophylaxis for 4 weeks (started 9/13).  Unsure why INR has decreased with increased dosing.  No new interacting med.  No bleeding documented.  Anticoagulation: Warfarin for VTE prophylaxis, INR 1.87. Hgb 8.1.  Infectious Disease: Ancef for MSSA bacteremia + septic left knee joint (infected TKR), plan for 6 wks abx, TEE negative for endocartidis. PAC removed, afebrile, WBC 6.6.  Vanc 9/13>>9/15 Cefazolin 9/17>> Rifampin 9/16>>9/19  Cardiovascular: HTN - BP 151/71, hr 75 on  losartan, HCTZ, atenolol   Endocrinology: serum glucose controlled  Gastrointestinal / Nutrition: regular diet, po Protonix. Chronic nausea  Neurology: hx multiple sclerosis, c/o nausea - scheduled Zofran (Rifampin d/c'ed), also on gabapentin, 9/23 head CT - no MS flare, no acute abnormality. Poor day-to-day memory. anxiety  Nephrology: ARF - SCr fluctuating, up today 1.87, CrCL 44 ml/min, lytes WNL, good UOP  Hematology / Oncology: breast CA - hgb low but stable, thrombocytosis - no iron d/t  GI complaints  PTA Medication Issues: baclofen, duloxetine (have not resumed d/t sedated property)  Best Practices: Coumadin, PPI PO   Goal of Therapy:  INR 2-3 Monitor platelets by anticoagulation protocol: Yes   Plan:  - Repeat Coumadin 10mg  PO today. Recommend home on 7.5mg /day - Daily PT / INR    Rigo Letts S. Merilynn Finland, PharmD, [facility] Clinical Staff Pharmacist Pager [phone]  03/16/2012, 10:28 AM

## 2012-03-16 NOTE — Plan of Care (Signed)
Problem: RH SKIN INTEGRITY Goal: RH STG MAINTAIN SKIN INTEGRITY WITH ASSISTANCE STG Maintain Skin Integrity With Assistance.  Outcome: Not Applicable Date Met:  03/16/12 No goal was set prior to discharge

## 2012-03-16 NOTE — Progress Notes (Signed)
Pt discharged to home with family at 73. Discharged instructions given by PA and RN. Home health care nurse plans dicussed with pt and family about antibiotics to be given. Family and pt verbalized understanding of instructions and plan or care. Belongings with pt and family.

## 2012-03-16 NOTE — Progress Notes (Signed)
Patient ID: Julie Ross, female   DOB: 1960-10-17, 51 y.o.   MRN: [id number] Subjective/Complaints: Excited to go home.  No SOB, No diaphoresis, no L arm pain A 12 point review of systems has been performed and if not noted above is otherwise negative.   Objective: Vital Signs: Blood pressure 151/71, pulse 75, temperature 97.8 F (36.6 C), temperature source Oral, resp. rate 19, height 6' (1.829 m), weight 93.3 kg (205 lb 11 oz), last menstrual period 12/05/2011, SpO2 99.00%. No results found.  Basename 03/16/12 0500 03/14/12 0600  WBC 6.6 7.0  HGB 8.1* 8.3*  HCT 25.2* 25.3*  PLT 480* 575*    Basename 03/16/12 0500 03/15/12 0657  NA 140 139  K 4.0 3.8  CL 98 97  CO2 33* 33*  GLUCOSE 109* 101*  BUN 19 19  CREATININE 1.87* 1.92*  CALCIUM 9.1 9.1   CBG (last 3)  No results found for this basename: GLUCAP:3 in the last 72 hours  Wt Readings from Last 3 Encounters:  03/16/12 93.3 kg (205 lb 11 oz)  03/02/12 88.451 kg (195 lb)  03/02/12 88.451 kg (195 lb)    Physical Exam:  Constitutional: She is oriented to person, place, and time. She appears well-developed and well-nourished.  HENT:  Head: Normocephalic and atraumatic.  Eyes: Pupils are equal, round, and reactive to light.  Neck: Normal range of motion.  Cardiovascular: Normal rate and regular rhythm.  Pulmonary/Chest: Effort normal and breath sounds normal. She exhibits tenderness (at prior [facility] site. dry dressing noted. ). Left side pectoralis origen tender to palpation Abdominal: Soft. She exhibits no distension. Bowel sounds are decreased. There is no tenderness.  Musculoskeletal: She exhibits edema (bilateral hands.).  Dry dressing left knee with KI in place. Wound CDI with staples  Neurological: She is alert and oriented to person, place, and time.  Soft spoken. Follows commands without difficulty.  Skin:  Petechial rash on BUE.  Rash on back stable  Psych: affect extremely flat, mild  anxiety  Assessment/Plan: 1. Functional deficits secondary to Septic L knee s/p poly exchange which require 3+ hours per day of interdisciplinary therapy in a comprehensive inpatient rehab setting. Physiatrist is providing close team supervision and 24 hour management of active medical problems listed below. Physiatrist and rehab team continue to assess barriers to discharge/monitor patient progress toward functional and medical goals.   mod I in the room!   FIM: FIM - Bathing Bathing Steps Patient Completed: Chest;Right Arm;Left Arm;Abdomen;Front perineal area;Buttocks;Right upper leg;Left upper leg;Right lower leg (including foot);Left lower leg (including foot) Bathing: 6: Assistive device (Comment)  FIM - Upper Body Dressing/Undressing Upper body dressing/undressing steps patient completed: Thread/unthread right sleeve of pullover shirt/dresss;Thread/unthread left sleeve of pullover shirt/dress;Put head through opening of pull over shirt/dress;Pull shirt over trunk;Thread/unthread right sleeve of front closure shirt/dress;Thread/unthread left sleeve of front closure shirt/dress;Pull shirt around back of front closure shirt/dress;Button/unbutton shirt Upper body dressing/undressing: 7: Complete Independence: No helper FIM - Lower Body Dressing/Undressing Lower body dressing/undressing steps patient completed: Thread/unthread right underwear leg;Thread/unthread left underwear leg;Pull underwear up/down;Thread/unthread right pants leg;Thread/unthread left pants leg;Pull pants up/down;Don/Doff right sock;Don/Doff left sock;Don/Doff right shoe;Don/Doff left shoe;Fasten/unfasten right shoe;Fasten/unfasten left shoe Lower body dressing/undressing: 6: Assistive device (Comment)  FIM - Toileting Toileting steps completed by patient: Adjust clothing prior to toileting;Performs perineal hygiene;Adjust clothing after toileting Toileting Assistive Devices: Grab bar or rail for support Toileting: 6:  Assistive device: No helper  FIM - Diplomatic Services operational officer Devices: Occupational hygienist Transfers: [employer]  device: No helper  FIM - Banker Devices: Therapist, occupational: 6: Assistive device: no helper;7: Sit > Supine: No assist;6: Bed > Chair or W/C: No assist;6: Chair or W/C > Bed: No assist;7: Supine > Sit: No assist  FIM - Locomotion: Wheelchair Locomotion: Wheelchair: 6: Travels 150 ft or more, turns around, maneuvers to table, bed or toilet, negotiates 3% grade: maneuvers on rugs and over door sills independently FIM - Locomotion: Ambulation Locomotion: Ambulation Assistive Devices: Designer, industrial/product Ambulation/Gait Assistance: 6: Modified independent (Device/Increase time) Locomotion: Ambulation: 6: Travels 150 ft or more independently/takes more than reasonable amount of time  Comprehension Comprehension Mode: Auditory Comprehension: 7-Follows complex conversation/direction: With no assist  Expression Expression Mode: Verbal Expression: 7-Expresses complex ideas: With no assist  Social Interaction Social Interaction: 7-Interacts appropriately with others - No medications needed.  Problem Solving Problem Solving: 7-Solves complex problems: Recognizes & self-corrects  Memory Memory: 6-More than reasonable amt of time  Medical Problem List and Plan:  1. DVT Prophylaxis/Anticoagulation: Pharmaceutical: Coumadin  2. Pain Management: continue prn tylenol./ultram  3. Mood: occasional confusion and anxiety. i think a lot of this is effects from her MS. Poor day to day memory. Tries to cover up at times. Poor memory tends to fuel anxiety at times. 4. Neuropsych: This patient is capable of making decisions on his/her own behalf.  5. ABLA: count stable at 8.1 to 8.3--need hh follow up next week. 6. Staph bacteremia: ancef-?alternatives  -hhrn for abx  -vistaril prn-watch cognitive side effects 7. Chronic nausea:  Likely exacerbated by antibiotics as well as constipation issues. Added probiotic to help with GI symptoms. ID to discontinue rifampin to see if this will help GI symptoms.  8. Constipation:   9. AKI/HTN: resolving. Continue foley for strict I/O. Continue daily renal panel. I think she is volume overloaded. BP's elevated also. Increased losartan to 100. Changed lasix to hctz 25mg  qday  -needs daily weights- weight stable to decreased from admit  -clonidine for bp spikes  -consider nephrology follow up  -serial bmets ok  -minimal swelling on exam  -added low dose atenolol which helped 10.  MSK CP, will tx with topical agent and heat 11. Anxiety start prn xanax, poor sleep not pain related increased trazodone 12. Bladder voiding fairly well LOS (Days) 8 A FACE TO FACE EVALUATION WAS PERFORMED  SWARTZ,ZACHARY T 03/16/2012, 8:18 AM

## 2012-03-16 NOTE — Progress Notes (Signed)
Social Work Discharge Note Discharge Note  The overall goal for the admission was met for:   Discharge location: Yes-HOME WITH HUSBAND  Length of Stay: Yes-9 DAYS  Discharge activity level: Yes-SUPERVISION/MOD/I LEVEL  Home/community participation: Yes  Services provided included: MD, RD, PT, OT, RN, Pharmacy and SW  Financial Services: Private Insurance: BCBS  Follow-up services arranged: Home Health: [facility]-RN,PT, DME: [facility]-TUB [facility]ENCH, ROLLING WALKER and Patient/Family has no preference for HH/DME agencies  Comments (or additional information):  Patient/Family verbalized understanding of follow-up arrangements: Yes  Individual responsible for coordination of the follow-up plan: PATIENT & FAMILY  Confirmed correct DME delivered: Lucy Chris 03/16/2012    Lucy Chris

## 2012-03-19 ENCOUNTER — Encounter: Payer: BC Managed Care – PPO | Admitting: Physical Therapy

## 2012-03-20 NOTE — Progress Notes (Signed)
Discharge summary [phone]

## 2012-03-21 ENCOUNTER — Ambulatory Visit: Payer: BC Managed Care – PPO

## 2012-03-21 NOTE — Discharge Summary (Signed)
NAME:  Julie Ross, Julie Ross                 ACCOUNT NO.:  [id number]  MEDICAL RECORD NO.:  [id number]  LOCATION:  4145                         FACILITY:  [facility]  PHYSICIAN:  Ranelle Oyster, M.D.DATE OF BIRTH:  [birth year]  DATE OF ADMISSION:  03/08/2012 DATE OF DISCHARGE:  03/16/2012                              DISCHARGE SUMMARY   DISCHARGE DIAGNOSES: 1. Septic left knee status post poly exchange. 2. Deconditioning due to bacteremia. 3. Acute blood loss anemia. 4. Chronic intermittent nausea, much improved.  HISTORY OF PRESENT ILLNESS:  [name] is a 51 year old female with history of hypertension, MS, breast cancer who underwent left total hip replacement in February 2013.  She was noted to have a painful swollen left knee on March 02, 2012.  Aspiration done showed WBCs and Gram-stain positive for Staph aureus.  Blood cultures done were also positive for Staph aureus.  She was admitted and underwent I and D with poly exchange the same day.  She was started on vancomycin for treatment on March 04, 2012.  She developed acute renal failure with hypoxia, poor urine output, and acute renal injury due to vancomycin toxicity and ARB.  She was started on IV fluids and antibiotic was changed to IV daptomycin.  ID was consulted for input and recommended TEE to rule out endocarditis.  They also recommended addition of rifampin.  TEE showed no evidence of SBE.  Port-A-Cath was removed on March 05, 2012.  Her acute renal failure is resolving and urine output improving.  Renal recommends close monitoring of I's and O's for now.  ID has recommended 6 weeks of IV antibiotics and to place PICC once most recent blood cultures negative.  The patient's prior Port-A-Cath site shows some induration with tenderness and CCS is following for dressing changes and packing of wound.  The patient has had complaints of anorexia as well as nausea and rifampin was discontinued today per input with  ID. Currently, the patient is deconditioned.  She was evaluated by therapist with CIR was recommended for progression.  PAST MEDICAL HISTORY: 1. Hypertension. 2. Hypothyroid. 3. MS. 4. GERD. 5. Depression. 6. Mild sleep apnea. 7. Left breast cancer.  FUNCTIONAL HISTORY:  The patient was independent, but sedentary prior to admission.  FUNCTIONAL STATUS:  The patient was max assist for bed mobility, +2 total assist 50% for stand pivot transfers.  Ambulation, gait not tested.  HOSPITAL COURSE:  [name] was admitted to Rehab on March 08, 2012, for inpatient therapies to consist of PT, OT at least 3 hours 5 days a week.  Past admission, physiatrist, rehab, RN, and therapy team have worked together to provide customized collaborative interdisciplinary care.  Rehab RN has worked with the patient on bowel and bladder program as well as wound monitoring and offering nutritional supplements.  The patient's blood pressures were checked on b.i.d. basis and these were noted to be on upward trend. therefore her Cozaar was resumed and meds were titrated for tighter control.  Blood pressures at the time of discharge are ranging from 150s to 170s systolic and 70s to 80s diastolic.  Anticipate as BP meds get to steady state, blood pressures will  continue to improve further.  The patient's Port-A-Cath site has been monitored along by Surgery.  Packing was removed and the sutures were discontinued on March 16, 2012, prior to discharge.  This site is clean and dry without erythema or drainage.   The patient has had multiple GI issues that were much improved with scheduling of Zofran q.i.d., adding of probiotic as well as strict bowel program.  Iron supplements were avoided due to GI issues.  Foley was discontinued and I/O were monitored. Renal status has been Stable and patient has had good urine output.  The patient was voiding  Without difficulty.  Most recent labs of March 16, 2012, revealed sodium 140, potassium 4.0, chloride 98, CO2 of 33, BUN 19, creatinine 1.87, glucose 109.  CBC at the time of discharge reveals hemoglobin 8.1, hematocrit 25.2, white count 6.6, platelets 480.  As the patient's  GI symptoms have resolved.  She was advised to start Nu-Iron on b.i.d. basis for now.  The patient did have some issues with itching, which is reasonably controlled with use of Atarax on p.r.n. basis.  She is currently off her Cymbalta, lower dose of Neurontin, as well as off hydrocodone and baclofen with much improvement in mentation per husband.  Her premorbid lethargy has resolved.  Overall endurance is greatly improved.  During the patient's stay in rehab, weekly team conferences were held to monitor the patient's progress, set goals, as well as discuss barriers to discharge.  Physical Therapy has been working with the patient on mobility, strengthening as well as balance.  At the time of discharge, the patient is modified independent for transfers and mobility with use of cane.  PT has also worked on increasing range of motion of left knee. Ice packs as well as support stockings are used to help with edema control.  The patient is able to ambulate in community environment at longer distances with use of rolling walker.  Family education was done with husband who will provide supervision as needed past discharge.  OT has worked with the patient on self-care tasks.  The patient is modified independent for bathing and dressing.  She requires supervision with home management tasks.  Further followup home health PT to continue via [facility] past discharge.  On March 16, 2012, the patient is discharged to home.  DISCHARGE MEDICATIONS: 1. Tylenol 325 to 650 mg p.o. q.4 hours p.r.n. pain. 2. Xanax 0.5 mg p.o. t.i.d. p.r.n. anxiety. 3. Atenolol 25 mg half p.o. per day. 4. Bacitracin to prior Port-A-Cath site b.i.d. 5. Ancef 1 g q.8 hours to continue  through April 12, 2012. 6. Gabapentin 100 mg p.o. t.i.d. 7. Hydrochlorothiazide 25 mg p.o. per day. 8. Cortisone cream to upper extremities b.i.d. p.r.n. itching. 9. Atarax 25 mg p.o. t.i.d. p.r.n. itching. 10.Cozaar 100 mg p.o. per day. 11.Zofran 4 mg p.o. q.i.d. for nausea, may taper as needed. 12.Ultram 50 mg 1-2 p.o. q.i.d. p.r.n. moderate-to-severe pain. 13.Coumadin 10 mg p.o. q.p.m. for 4 total weeks. 14.Magnesium with calcium supplements daily. 15.Colace 100 mg b.i.d. 16.Nexium 40 mg p.o. per day. 17.Multivitamin 1 p.o. per day.  DIET:  Regular.  ACTIVITY LEVEL:  As tolerated with intermittent supervision.  WOUND CARE:  Keep area clean and dry.  SPECIAL INSTRUCTIONS:  [facility] to provide PT and RN.  Stop taking baclofen, clonazepam, Cymbalta, Norco, Compazine, and home dose of gabapentin.  FOLLOWUP:  The patient to follow up with Dr. Jodi Geralds for postop check in 10 days.  Follow up with Dr. Johny Sax on April 11, 2012, at 2 p.m.  Follow up with Dr. Riley Kill as needed.  Follow up with Neil Crouch, PA-C on March 23, 2012, at 3 p.m.     Delle Reining, P.A.   ______________________________ Ranelle Oyster, M.D.    PL/MEDQ  D:  03/20/2012  T:  03/21/2012  Job:  [id number]  cc:   Lacretia Leigh. Ninetta Lights, M.D. Harvie Junior, M.D. Neil Crouch, PA-C

## 2012-03-22 ENCOUNTER — Ambulatory Visit (HOSPITAL_COMMUNITY)
Admission: RE | Admit: 2012-03-22 | Discharge: 2012-03-22 | Disposition: A | Discharge status: Home / Self Care | Payer: BC Managed Care – PPO | Source: Ambulatory Visit | Attending: Internal Medicine | Admitting: Internal Medicine

## 2012-03-22 ENCOUNTER — Encounter (HOSPITAL_COMMUNITY): Payer: Self-pay

## 2012-03-22 ENCOUNTER — Ambulatory Visit (HOSPITAL_BASED_OUTPATIENT_CLINIC_OR_DEPARTMENT_OTHER)
Admission: RE | Admit: 2012-03-22 | Discharge: 2012-03-22 | Disposition: A | Discharge status: Home / Self Care | Payer: BC Managed Care – PPO | Source: Ambulatory Visit | Attending: Internal Medicine | Admitting: Internal Medicine

## 2012-03-22 ENCOUNTER — Encounter: Payer: Self-pay | Admitting: Pharmacist

## 2012-03-22 VITALS — BP 152/104 | HR 79 | Ht 66.0 in | Wt 196.0 lb

## 2012-03-22 DIAGNOSIS — C50419 Malignant neoplasm of upper-outer quadrant of unspecified female breast: Secondary | ICD-10-CM

## 2012-03-22 DIAGNOSIS — IMO0002 Reserved for concepts with insufficient information to code with codable children: Secondary | ICD-10-CM | POA: Insufficient documentation

## 2012-03-22 DIAGNOSIS — M1710 Unilateral primary osteoarthritis, unspecified knee: Secondary | ICD-10-CM | POA: Insufficient documentation

## 2012-03-22 DIAGNOSIS — G8929 Other chronic pain: Secondary | ICD-10-CM | POA: Insufficient documentation

## 2012-03-22 DIAGNOSIS — G35 Multiple sclerosis: Secondary | ICD-10-CM | POA: Insufficient documentation

## 2012-03-22 NOTE — Assessment & Plan Note (Addendum)
Patient seen and examined with Tonye Becket, NP. We discussed all aspects of the encounter. I agree with the assessment and plan as stated above.  My thoughts are below.   I reviewed echos personally. EF and Doppler parameters stable. No HF on exam. Herception and chemo on hold until infection clears. Will see her back in 4 months.

## 2012-03-22 NOTE — Patient Instructions (Addendum)
Follow up in 4 months with an ECHO. Follow  Up with Dr Gala Romney

## 2012-03-22 NOTE — Progress Notes (Signed)
Patient ID: Julie Ross, female   DOB: 1960-09-20, 51 y.o.   MRN: [id number] Orthopedic: Dr Luiz Blare Oncologist: Dr Welton Flakes ID: Dr Ninetta Lights  HPI: Julie Ross is a 51 year old female with ductal carcinoma in situ of the left breast, HTN, osteoarthritis, and MS (1998)  S/P L mastectomy and porta cath  12/19/11.  Plan to start carboplatnum toxotere/Herceptin to start 01/27/12 for for 12 weeks. HER-2/neu positive. 1 of  4 lymph nodes + for metastatic disease.  Pathology T1 N1 M0. Stage II . 08/15/11 TKR  01/11/12 ECHO EF 60% poor window. 03/02/12 EF 60-65% lateal S' 13.9  She had 2 treatments of carbo/taxotere and Herceptin. Then was admitted for infection of L TKR. D/c'd 9/27. Now on IV abx x 6 weeks. Chemo on hold. Denies SOB/PND/Orthopnea    Past Medical History  Diagnosis Date  . Hypertension   . Hypothyroidism   . Arthritis   . Neuromuscular disorder     MS TX WITH CYMBALTA   . Multiple sclerosis   . H/O colonoscopy   . H/O bone density study 03/2011  . Wears glasses   . Heart burn   . Depression   . Sleep apnea     MILD SLEEP APNEA, NO MACHINE  6 YRS AGO [facility]   . Breast cancer     lt breast    Current Outpatient Prescriptions  Medication Sig Dispense Refill  . acetaminophen (TYLENOL) 325 MG tablet Take 1-2 tablets (325-650 mg total) by mouth every 4 (four) hours as needed.      . ALPRAZolam (XANAX) 0.5 MG tablet Take 1 tablet (0.5 mg total) by mouth 3 (three) times daily as needed for anxiety.  15 tablet  0  . atenolol (TENORMIN) 25 MG tablet Take 0.5 tablets (12.5 mg total) by mouth daily. For blood pressure  15 tablet  0  . bacitracin ointment Apply topically 2 (two) times daily.  120 g    . CALCIUM-MAGNESIUM PO Take 1 tablet by mouth daily.      . camphor-menthol (SARNA) lotion Apply topically as needed for itching.  222 mL    . ceFAZolin (ANCEF) 1-5 GM-% Inject 50 mLs (1 g total) into the vein every 8 (eight) hours. Antibiotics to continue through 04/12/12.  Date to be confirmed  with ID past follow up.  1800 mL  0  . docusate sodium (COLACE) 100 MG capsule Take 1 capsule (100 mg total) by mouth 2 (two) times daily.  10 capsule  0  . esomeprazole (NEXIUM) 40 MG capsule Take 40 mg by mouth daily before breakfast.      . gabapentin (NEURONTIN) 100 MG capsule Take 1 capsule (100 mg total) by mouth 3 (three) times daily. For neuropathy  90 capsule  1  . hydrochlorothiazide (HYDRODIURIL) 25 MG tablet Take 1 tablet (25 mg total) by mouth daily. For blood pressure.  30 tablet  1  . hydrocortisone cream 1 % Apply topically 2 (two) times daily. Apply topically 2 (two) times daily. Apply to upper extremities for  Itching.  30 g    . hydrOXYzine (ATARAX/VISTARIL) 25 MG tablet Take 1 tablet (25 mg total) by mouth 3 (three) times daily as needed for itching.  90 tablet  1  . losartan (COZAAR) 100 MG tablet Take 1 tablet (100 mg total) by mouth daily. For blood pressure  30 tablet  1  . Multiple Vitamins-Minerals (MULTIVITAMINS THER. W/MINERALS) TABS Take 1 tablet by mouth daily.      . ondansetron (ZOFRAN)  4 MG tablet Take 1 tablet (4 mg total) by mouth 4 (four) times daily. To help with nausea.  May taper to as needed if stomach symptoms improve  120 tablet  1  . traMADol (ULTRAM) 50 MG tablet Take 1-2 tablets (50-100 mg total) by mouth every 4 (four) hours as needed. For moderate to severe pain.  100 tablet  0  . warfarin (COUMADIN) 5 MG tablet Take 2 tablets (10 mg total) by mouth daily. Blood thinner. Take with supper.  60 tablet  0     No Known Allergies  History   Social History  . Marital Status: Married    Spouse Name: N/A    Number of Children: N/A  . Years of Education: N/A   Occupational History  . Not on file.   Social History Main Topics  . Smoking status: Never Smoker   . Smokeless tobacco: Never Used  . Alcohol Use: No  . Drug Use: No  . Sexually Active: Yes   Other Topics Concern  . Not on file   Social History Narrative  . No narrative on file     Family History  Problem Relation Age of Onset  . Breast cancer Paternal Aunt   . Breast cancer Maternal Grandmother   . Heart disease Maternal Grandfather     PHYSICAL EXAM: Filed Vitals:   03/22/12 0935  BP: 152/104  Pulse: 79   General:  Chronically ill  appearing. No respiratory difficulty (daughter and husband present) HEENT: normal Neck: supple. no JVD. Carotids 2+ bilat; no bruits. No lymphadenopathy or thryomegaly appreciated. Cor: PMI nondisplaced. Regular rate & rhythm. No rubs, gallops or murmurs. Lungs: clear Abdomen: soft, nontender, nondistended. No hepatosplenomegaly. No bruits or masses. Good bowel sounds. Extremities: no cyanosis, clubbing, rash, RLE 1+ LLE 2+ edema Neuro: alert & oriented x 3, cranial nerves grossly intact. moves all 4 extremities w/o difficulty. Affect pleasant.  ASSESSMENT & PLAN:

## 2012-03-23 ENCOUNTER — Other Ambulatory Visit: Payer: BC Managed Care – PPO | Admitting: Lab

## 2012-03-23 ENCOUNTER — Ambulatory Visit: Payer: BC Managed Care – PPO

## 2012-03-23 ENCOUNTER — Ambulatory Visit: Payer: BC Managed Care – PPO | Admitting: Oncology

## 2012-03-24 ENCOUNTER — Ambulatory Visit: Payer: BC Managed Care – PPO

## 2012-03-26 ENCOUNTER — Encounter: Payer: Self-pay | Admitting: Oncology

## 2012-03-26 ENCOUNTER — Encounter: Payer: BC Managed Care – PPO | Admitting: Physical Therapy

## 2012-03-26 NOTE — Progress Notes (Signed)
Called patient and left message. Need for patient to come by and sign form for Neulasta.

## 2012-03-27 ENCOUNTER — Encounter: Payer: Self-pay | Admitting: Infectious Diseases

## 2012-03-28 ENCOUNTER — Telehealth: Payer: Self-pay | Admitting: *Deleted

## 2012-03-28 NOTE — Telephone Encounter (Signed)
Pt called states " I just wanted to make sure Dr. Welton Flakes knew I was in the hospital for a about 2 weeks because of a blood infection."  Pt advised she had port removed, PICC line placed. [facility] help with Dressing changes, pt will complete antibiotics on 10/23, she will then return to see Infectious Disease MD. Pt has no appeetite, drinking ensure and trying to keep hydrated. Pt denies any fever,pain. Confirmed with pt her appt 10/10 lab/MD visit. Pt verbalized understanding. Message forwarded to MD for review.

## 2012-03-28 NOTE — Telephone Encounter (Signed)
ok 

## 2012-03-29 ENCOUNTER — Ambulatory Visit (HOSPITAL_BASED_OUTPATIENT_CLINIC_OR_DEPARTMENT_OTHER): Payer: BC Managed Care – PPO

## 2012-03-29 ENCOUNTER — Encounter: Payer: Self-pay | Admitting: Oncology

## 2012-03-29 ENCOUNTER — Ambulatory Visit (HOSPITAL_BASED_OUTPATIENT_CLINIC_OR_DEPARTMENT_OTHER): Payer: BC Managed Care – PPO | Admitting: Oncology

## 2012-03-29 ENCOUNTER — Other Ambulatory Visit (HOSPITAL_BASED_OUTPATIENT_CLINIC_OR_DEPARTMENT_OTHER): Payer: BC Managed Care – PPO | Admitting: Lab

## 2012-03-29 ENCOUNTER — Telehealth: Payer: Self-pay | Admitting: *Deleted

## 2012-03-29 ENCOUNTER — Other Ambulatory Visit: Payer: Self-pay | Admitting: Oncology

## 2012-03-29 ENCOUNTER — Telehealth: Payer: Self-pay | Admitting: Oncology

## 2012-03-29 VITALS — BP 130/84 | HR 60 | Temp 97.3°F | Resp 20 | Ht 66.0 in | Wt 189.8 lb

## 2012-03-29 DIAGNOSIS — I059 Rheumatic mitral valve disease, unspecified: Secondary | ICD-10-CM

## 2012-03-29 DIAGNOSIS — C50419 Malignant neoplasm of upper-outer quadrant of unspecified female breast: Secondary | ICD-10-CM

## 2012-03-29 DIAGNOSIS — R7881 Bacteremia: Secondary | ICD-10-CM

## 2012-03-29 DIAGNOSIS — Z170 Estrogen receptor positive status [ER+]: Secondary | ICD-10-CM

## 2012-03-29 DIAGNOSIS — Z5112 Encounter for antineoplastic immunotherapy: Secondary | ICD-10-CM

## 2012-03-29 LAB — CBC WITH DIFFERENTIAL/PLATELET
BASO%: 1.3 % (ref 0.0–2.0)
Basophils Absolute: 0.1 10*3/uL (ref 0.0–0.1)
EOS%: 3.2 % (ref 0.0–7.0)
Eosinophils Absolute: 0.2 10*3/uL (ref 0.0–0.5)
HCT: 31.9 % — ABNORMAL LOW (ref 34.8–46.6)
HGB: 10.7 g/dL — ABNORMAL LOW (ref 11.6–15.9)
LYMPH%: 30.3 % (ref 14.0–49.7)
MCH: 28.2 pg (ref 25.1–34.0)
MCHC: 33.5 g/dL (ref 31.5–36.0)
MCV: 83.9 fL (ref 79.5–101.0)
MONO#: 0.8 10*3/uL (ref 0.1–0.9)
MONO%: 10.1 % (ref 0.0–14.0)
NEUT#: 4.1 10*3/uL (ref 1.5–6.5)
NEUT%: 55.1 % (ref 38.4–76.8)
Platelets: 254 10*3/uL (ref 145–400)
RBC: 3.8 10*6/uL (ref 3.70–5.45)
RDW: 17.3 % — ABNORMAL HIGH (ref 11.2–14.5)
WBC: 7.4 10*3/uL (ref 3.9–10.3)
lymph#: 2.3 10*3/uL (ref 0.9–3.3)
nRBC: 0 % (ref 0–0)

## 2012-03-29 LAB — COMPREHENSIVE METABOLIC PANEL (CC13)
ALT: <6 U/L (ref 0–55)
AST: 19 U/L (ref 5–34)
Albumin: 3.5 g/dL (ref 3.5–5.0)
Alkaline Phosphatase: 196 U/L — ABNORMAL HIGH (ref 40–150)
BUN: 18 mg/dL (ref 7.0–26.0)
CO2: 23 mEq/L (ref 22–29)
Calcium: 9.4 mg/dL (ref 8.4–10.4)
Chloride: 98 mEq/L (ref 98–107)
Creatinine: 1.2 mg/dL — ABNORMAL HIGH (ref 0.6–1.1)
Glucose: 103 mg/dl — ABNORMAL HIGH (ref 70–99)
Potassium: 3.7 mEq/L (ref 3.5–5.1)
Sodium: 134 mEq/L — ABNORMAL LOW (ref 136–145)
Total Bilirubin: 0.9 mg/dL (ref 0.20–1.20)
Total Protein: 7.4 g/dL (ref 6.4–8.3)

## 2012-03-29 LAB — PROTIME-INR
INR: 1.8 — ABNORMAL LOW (ref 2.00–3.50)
Protime: 21.6 Seconds — ABNORMAL HIGH (ref 10.6–13.4)

## 2012-03-29 MED ORDER — TRASTUZUMAB CHEMO INJECTION 440 MG
6.0000 mg/kg | Freq: Once | INTRAVENOUS | Status: AC
  Administered 2012-03-29: 525 mg via INTRAVENOUS
  Filled 2012-03-29: qty 25

## 2012-03-29 MED ORDER — SODIUM CHLORIDE 0.9 % IV SOLN
Freq: Once | INTRAVENOUS | Status: AC
  Administered 2012-03-29: 11:00:00 via INTRAVENOUS

## 2012-03-29 MED ORDER — DIPHENHYDRAMINE HCL 25 MG PO CAPS
50.0000 mg | ORAL_CAPSULE | Freq: Once | ORAL | Status: AC
  Administered 2012-03-29: 50 mg via ORAL

## 2012-03-29 MED ORDER — ACETAMINOPHEN 325 MG PO TABS
650.0000 mg | ORAL_TABLET | Freq: Once | ORAL | Status: AC
  Administered 2012-03-29: 650 mg via ORAL

## 2012-03-29 NOTE — Telephone Encounter (Signed)
Per staff message and POF I have scheduled appts. JWM  

## 2012-03-29 NOTE — Progress Notes (Signed)
Advised patient of Neulasta copay program. Had her to sign consent form and advised her of the 25.00 copy per injection. She said she has had some prior, did advise her those are not covered, just the ones going forth from 03/29/12.

## 2012-03-29 NOTE — Progress Notes (Signed)
OFFICE PROGRESS NOTE  CC  Julie Ross [address] [place] [address] Dr. Cyndia Ross Julie Ross Dr. Mardella Ross Dr. Gustavus Ross ([facility])  DIAGNOSIS: 51 year old female who originally was seen in the multidisciplinary breast clinic for discussion of new diagnosis of left breast cancer. She is now status post left mastectomy with the final pathology revealing an invasive ductal carcinoma that is ER positive PR positive HER-2/neu positive with Ki-67 11%.  PRIOR THERAPY:  #1 patient was originally seen in the multidisciplinary breast clinic on 11/30/2011 for what at that time looked like a ductal carcinoma in situ of the left breast. Due to the extent of disease she was recommended a mastectomy with axillary lymph node sampling.  #2 12/19/2011 patient had a left mastectomy with sentinel lymph node biopsy. Her final pathology revealed 2 foci of invasive ductal carcinoma measuring 0.8 and 0.6 cm. The tumor was low grade ER positive PR positive. Patient's tumor was found to be HER-2/neu positive with HER-2 ratio of 3.27. Ki-67 was 11%. Patient had sentinel lymph node  Biopsies performed one of 4 lymph nodes were positive for metastatic disease. Patient's final pathologic staging is T1 N1 M0. Stage II  #3 patient is receiving adjuvant chemotherapy consisting of Taxotere carboplatinum administered every 21 days with weekly Herceptin. A total of 4 cycles of Taxotere carboplatinum were planned.    #4 Herceptin every three weeks starting 03/29/12.  Chemotherapy on hold due to bacteremia and left knee infection.    CURRENT THERAPY: Herceptin  INTERVAL HISTORY: Julie Ross 51 y.o. female returns for follow-up.  She is very discouraged due to the fact that she developed an infection in her left knee mid-September.  She underwent surgery, had her port removed, and was started on antibiotics for the blood stream infection.  She has missed  her chemotherapy and herceptin since this started.  Since her discharge, she has been working with home health PT, and has been regaining her strength.  Other than working on her strength, she is feeling well and without concerns.    MEDICAL HISTORY: Past Medical History  Diagnosis Date  . Hypertension   . Hypothyroidism   . Arthritis   . Neuromuscular disorder     MS TX WITH CYMBALTA   . Multiple sclerosis   . H/O colonoscopy   . H/O bone density study 03/2011  . Wears glasses   . Heart burn   . Depression   . Sleep apnea     MILD SLEEP APNEA, NO MACHINE  6 YRS AGO HPT [facility]   . Breast cancer     lt breast    ALLERGIES:   has no known allergies.  MEDICATIONS:  Current Outpatient Prescriptions  Medication Sig Dispense Refill  . acetaminophen (TYLENOL) 325 MG tablet Take 1-2 tablets (325-650 mg total) by mouth every 4 (four) hours as needed.      . ALPRAZolam (XANAX) 0.5 MG tablet Take 1 tablet (0.5 mg total) by mouth 3 (three) times daily as needed for anxiety.  15 tablet  0  . atenolol (TENORMIN) 25 MG tablet Take 0.5 tablets (12.5 mg total) by mouth daily. For blood pressure  15 tablet  0  . bacitracin ointment Apply topically 2 (two) times daily.  120 g    . CALCIUM-MAGNESIUM PO Take 1 tablet by mouth daily.      . camphor-menthol (SARNA) lotion Apply topically as needed for itching.  222 mL    .  ceFAZolin (ANCEF) 1-5 GM-% Inject 50 mLs (1 g total) into the vein every 8 (eight) hours. Antibiotics to continue through 04/12/12.  Date to be confirmed with ID past follow up.  1800 mL  0  . docusate sodium (COLACE) 100 MG capsule Take 100 mg by mouth daily.      Marland Kitchen esomeprazole (NEXIUM) 40 MG capsule Take 40 mg by mouth daily before breakfast.      . gabapentin (NEURONTIN) 100 MG capsule Take 1 capsule (100 mg total) by mouth 3 (three) times daily. For neuropathy  90 capsule  1  . hydrochlorothiazide (HYDRODIURIL) 25 MG tablet Take 1 tablet (25 mg total) by mouth daily. For blood  pressure.  30 tablet  1  . hydrOXYzine (ATARAX/VISTARIL) 25 MG tablet Take 1 tablet (25 mg total) by mouth 3 (three) times daily as needed for itching.  90 tablet  1  . IRON PO Take 45 mg by mouth.      . losartan (COZAAR) 100 MG tablet Take 1 tablet (100 mg total) by mouth daily. For blood pressure  30 tablet  1  . Multiple Vitamins-Minerals (MULTIVITAMINS THER. W/MINERALS) TABS Take 1 tablet by mouth daily.      . Probiotic Product (PROBIOTIC DAILY PO) Take by mouth.      . prochlorperazine (COMPAZINE) 10 MG tablet Take 10 mg by mouth every 6 (six) hours as needed.      . traMADol (ULTRAM) 50 MG tablet Take 1-2 tablets (50-100 mg total) by mouth every 4 (four) hours as needed. For moderate to severe pain.  100 tablet  0  . warfarin (COUMADIN) 5 MG tablet Take 5 mg by mouth daily. Blood thinner. Take with supper.      . hydrocortisone cream 1 % Apply topically 2 (two) times daily. Apply topically 2 (two) times daily. Apply to upper extremities for  Itching.  30 g    . ondansetron (ZOFRAN) 4 MG tablet Take 1 tablet (4 mg total) by mouth 4 (four) times daily. To help with nausea.  May taper to as needed if stomach symptoms improve  120 tablet  1    SURGICAL HISTORY:  Past Surgical History  Procedure Date  . Tonsillectomy   . Tumor removal     FROM PELVIS AGE 39  . Cesarean section     X2   . No past surgeries     BLADDER SLING  4-5 YRS AGO   . Knee arthroscopy     LT KNEE 04/2011  . Total knee arthroplasty 08/15/2011    Procedure: TOTAL KNEE ARTHROPLASTY; lft Surgeon: Julie Junior, MD;  Location: [facility] OR;  Service: Orthopedics;  Laterality: Left;  RIGHT KNEE CORTIZONE INJECTION  . Mohs surgery   . Knee arthroscopy 82    rt  . Joint replacement Feb 2013  . Mastectomy w/ sentinel node biopsy 12/19/2011    Procedure: MASTECTOMY WITH SENTINEL LYMPH NODE BIOPSY;  Surgeon: Julie Paris, MD;  Location: [facility] OR;  Service: General;  Laterality: Left;  left breast and left axilla   . Portacath  placement 01/16/2012    Procedure: INSERTION PORT-A-CATH;  Surgeon: Julie Paris, MD;  Location: [facility];  Service: General;  Laterality: Right;  Porta Cath Placement   . I&d knee with poly exchange 03/02/2012    Procedure: IRRIGATION AND DEBRIDEMENT KNEE WITH POLY EXCHANGE;  Surgeon: Julie Junior, MD;  Location: [facility] OR;  Service: Orthopedics;  Laterality: Left;  I&D of total knee  with Possible Poly Exchange   . Tee without cardioversion 03/06/2012    Procedure: TRANSESOPHAGEAL ECHOCARDIOGRAM (TEE);  Surgeon: Wendall Stade, MD;  Location: [facility] ENDOSCOPY;  Service: Cardiovascular;  Laterality: N/A;  Rm. [id number]  . Port-a-cath removal 03/06/2012    Procedure: REMOVAL PORT-A-CATH;  Surgeon: Adolph Pollack, MD;  Location: [facility] OR;  Service: General;  Laterality: N/A;    REVIEW OF SYSTEMS:   General: fatigue (-), night sweats (-), fever (-), pain (-) Lymph: palpable nodes (-) HEENT: vision changes (-), mucositis (-), gum bleeding (-), epistaxis (-) Cardiovascular: chest pain (-), palpitations (-) Pulmonary: shortness of breath (-), dyspnea on exertion (-), cough (-), hemoptysis (-) GI:  Early satiety (-), melena (-), dysphagia (-), nausea/vomiting (-), diarrhea (-) GU: dysuria (-), hematuria (-), incontinence (-) Musculoskeletal: joint swelling (-), joint pain (-), back pain (-) Neuro: weakness (+), numbness (-), headache (-), confusion (-) Skin: Rash (-), lesions (-), dryness (-) Psych: depression (-), suicidal/homicidal ideation (-), feeling of hopelessness (-)   PHYSICAL EXAMINATION:  BP 130/84  Pulse 60  Temp 97.3 F (36.3 C)  Resp 20  Ht 5\' 6"  (1.676 m)  Wt 189 lb 12.8 oz (86.093 kg)  BMI 30.63 kg/m2  LMP 12/05/2011 General: Patient is a well appearing female in no acute distress HEENT: PERRLA, sclerae anicteric no conjunctival pallor, MMM Neck: supple, no palpable adenopathy Lungs: clear to auscultation bilaterally, no wheezes, rhonchi, or rales Cardiovascular:  regular rate rhythm, S1, S2, no murmurs, rubs or gallops Abdomen: Soft, non-tender, non-distended, normoactive bowel sounds, no HSM Extremities: warm and well perfused, no clubbing, cyanosis, or edema Skin: No rashes or lesions Neuro: Non-focal Breast: left mastectomy site well healed, no nodularity or local sign of recurrence, right breast with no masses, or abnormality.  ECOG PERFORMANCE STATUS: 1 - Symptomatic but completely ambulatory    LABORATORY DATA: Lab Results  Component Value Date   WBC 7.4 03/29/2012   HGB 10.7* 03/29/2012   HCT 31.9* 03/29/2012   MCV 83.9 03/29/2012   PLT 254 03/29/2012      Chemistry      Component Value Date/Time   NA 140 03/16/2012 0500   NA 136 02/24/2012 1049   K 4.0 03/16/2012 0500   K 3.7 02/24/2012 1049   CL 98 03/16/2012 0500   CL 98 02/24/2012 1049   CO2 33* 03/16/2012 0500   CO2 27 02/24/2012 1049   BUN 19 03/16/2012 0500   BUN 13.0 02/24/2012 1049   CREATININE 1.87* 03/16/2012 0500   CREATININE 0.8 02/24/2012 1049      Component Value Date/Time   CALCIUM 9.1 03/16/2012 0500   CALCIUM 9.4 02/24/2012 1049   ALKPHOS 494* 03/09/2012 0535   ALKPHOS 187* 02/24/2012 1049   AST 18 03/09/2012 0535   AST 35* 02/24/2012 1049   ALT 11 03/09/2012 0535   ALT 68* 02/24/2012 1049   BILITOT 0.2* 03/09/2012 0535   BILITOT 1.00 02/24/2012 1049     DOB: 09-11-1960 Age: 58 Gender: F Client Name [facility] Collected Date: 12/19/2011 Received Date: 12/19/2011 Physician: Julie Ross Chart #: MRN # : [id number] Physician cc: Julie Chimera, MD Sherian Rein, MD Race:W Visit #: [id number] Kaylyn Lim, RN REPORT OF SURGICAL PATHOLOGY ADDITIONAL INFORMATION: 5. PROGNOSTIC INDICATORS - ACIS Results IMMUNOHISTOCHEMICAL AND MORPHOMETRIC ANALYSIS BY THE AUTOMATED CELLULAR IMAGING SYSTEM (ACIS) Estrogen Receptor (Negative, <1%): 100%, STRONG STAINING INTENSITY Progesterone Receptor (Negative, <1%): 100%, STRONG STAINING INTENSITY Proliferation Marker Ki67  by M IB-1 (Low<20%): 11% All  controls stained appropriately Pecola Leisure MD Pathologist, Electronic Signature ( Signed 12/29/2011) 5. CHROMOGENIC IN-SITU HYBRIDIZATION Interpretation: HER2/NEU BY CISH - SHOWS AMPLIFICATION BY CISH ANALYSIS. THE RATIO OF HER2: CEP 17 SIGNALS WAS 3.27 Reference range: Ratio: HER2:CEP17 < 1.8 gene amplification not observed Ratio: HER2:CEP 17 1.8-2.2 - equivocal result Ratio: HER2:CEP17 > 2.2 - gene amplification observed Pecola Leisure MD Pathologist, Electronic Signature ( Signed 12/27/2011) 1 of 4 FINAL for Hipwell, Brendy M ([id number]) FINAL DIAGNOSIS Diagnosis 1. Lymph node, sentinel, biopsy, Left axillary - ONE BENIGN LYMPH NODE WITH NO TUMOR SEEN (0/1). 2. Lymph node, sentinel, biopsy, Left axilla - ONE LYMPH NODE POSITIVE FOR METASTATIC DUCTAL CARCINOMA (1/1). - SEE COMMENT. 3. Lymph node, sentinel, biopsy, Left axilla - ONE BENIGN LYMPH NODE WITH NO TUMOR SEEN (0/1). 4. Lymph node, sentinel, biopsy, Left axilla - ONE BENIGN LYMPH NODE WITH NO TUMOR SEEN (0/1). 5. Breast, simple mastectomy, Left - INVASIVE GRADE I DUCTAL CARCINOMA, TWO FOCI MEASURING 0.8 CM AND 0.6 CM ARISING IN A BACKGROUND OF EXTENSIVE INTERMEDIATE GRADE DUCTAL CARCINOMA IN SITU WITH NECROSIS. - MARGINS ARE NEGATIVE. - SEE ONCOLOGY TEMPLATE. Microscopic Comment 2. On re-review of the frozen section slides, no metastatic carcinoma is identified. The carcinoma present is on permanent sections only. 5. BREAST, INVASIVE TUMOR, WITH LYMPH NODE SAMPLING Specimen, including laterality: Left breast with sentinel lymph node. Procedure: Left simple mastectomy with sentinel lymph node biopsy. Grade: I (Both foci of tumors show similar morphology). Tubule formation: 2. Nuclear pleomorphism: 2. Mitotic: 1. Tumor size (glass slide measurement): Two foci measuring 0.8 cm and 0.6 cm. Margins: Invasive, distance to closest margin: At least 1.5 cm. In-situ, distance to closest margin:  At least 1.5 cm. Lymphovascular invasion: No definite lymph/vascular invasion is not identified however a sentinel lymph node is positive (see below). Ductal carcinoma in situ: Yes. Grade: Intermediate grade. Extensive intraductal component: Yes. Lobular neoplasia: Not identified. Tumor focality: Two foci. Treatment effect: Not applicable. Extent of tumor: Tumor confined to breast parenchyma. Lymph nodes: # examined: 4. Lymph nodes with metastasis: 1. Macrometastasis: (> 2.0 mm): 1. Extracapsular extension: Not identified. Breast prognostic profile: A breast prognostic profile will be performed (see below comment). Additional breast findings: Fibrocystic changes with usual ductal hyperplasia. TNM: mpT1b, pN1a, MX. Comment: The palpable nodular area seen grossly is comprised predominantly of intermediate grade ductal carcinoma in situ with only two foci of invasive ductal carcinoma. Both invasive foci are morphologically similar. As such a breast prognostic profile will only be performed on the larger tumor. A breast prognostic profile can be performed upon the smaller tumor per request. (RH:kh 12-21-11) 2 of 4 FINAL for Imbert, Talise M ([id number]) Microscopic Comment(continued) Zandra Abts MD Pathologist, Electronic Signature (Case signed 12/21/2011) Intraoperative Diagnosis 1. RECEIVED FRESH IS TISSUE THAT CONTAINS A LYMPH NODE WHICH IS SAMPLED FOR RIOC. LEFT AXILLARY SENTINEL LYMPH NODE, FROZEN SECTION AND TOUCH PREPARATION- ONE LYMPH NODE WITH NO METASTATIC CARCINOMA IDENTIFIED. (RAH) 2. RECEIVED FRESH IS TISSUE THAT CONTAINS A LYMPH NODE WHICH IS SAMPLED FOR RIOC. LEFT AXILLARY SENTINEL LYMPH NODE, FROZEN SECTION AND TOUCH PREPARATION- ONE LYMPH NODE WITH NO METASTATIC CARCINOMA IDENTIFIED. (RAH) 3. RECEIVED FRESH IS TISSUE THAT CONTAINS A LYMPH NODE WHICH IS SAMPLED FOR RIOC. LEFT AXILLARY SENTINEL LYMPH NODE, FROZEN SECTION AND TOUCH PREPARATION- ONE LYMPH NODE WITH NO  METASTATIC CARCINOMA IDENTIFIED. (RAH) 4. RECEIVED FRESH IS TISSUE THAT CONTAINS A LYMPH NODE WHICH IS SAMPLED FOR RIOC. LEFT AXILLARY SENTINEL LYMPH NODE, FROZEN SECTION AND TOUCH PREPARATION- ONE LYMPH NODE  WITH NO METASTATIC CARCINOMA IDENTIFIED. (RAH) Specimen Gross and Clinical Information Specimen(s) Obtained: 1. Lymph node, sentinel, biopsy, Left axillary 2. Lymph node, sentinel, biopsy, Left axilla 3. Lymph node, sentinel, biopsy, Left axilla 4. Lymph node, sentinel, biopsy, Left axilla 5. Breast, simple mastectomy, Left Specimen Clinical Information 1. Left breast cancer (tl) Gross 1. Rapid Intraoperative Consult performed (Yes or No): Yes. Specimen: Left axillary sentinel node. Number and size: One node, 2.2 cm Cut Surface(s): Soft, fatty, focally blue. Block Summary: The node is sectioned, touch preparations are made on one slide, and the tissue is submitted in one block labeled SLN for frozen section. 2. Rapid Intraoperative Consult performed (Yes or No): Yes. Specimen: Left axillary sentinel node. Number and size: One node, 2.1 cm. Cut Surface(s): Soft, fatty, focally blue. Block Summary: The node is bisected, touch preparations are made on one slide, and is submitted in one block labeled SLN for frozen section. 3. Rapid Intraoperative Consult performed (Yes or No): Yes. Specimen: Left axillary sentinel node. Number and size: One node, 0.7 cm Cut Surface(s): Soft, fatty, focally blue. Block Summary: The node is bisected, touch preparations are made on one slide, and is submitted in one block labeled SLN for frozen section. 4. Rapid Intraoperative Consult performed (Yes or No): Yes. Specimen: Left axillary sentinel node. Number and size: One node, 1.5 cm. 3 of 4 FINAL for Stempel, Elen M ([id number]) Gross(continued) Cut Surface(s): Soft, fatty, non blue. Block Summary: The node is bisected, touch preparations are made on one slide and is submitted in one block  labeled SLN for frozen section. (SW:gt, 12/20/11) 5. Specimen: Left simple mastectomy, received fresh. The specimen was placed in formalin at 2:50 p.m. on 12/19/11. Specimen integrity (intact/disrupted): Intact. Weight: 1185 grams. Size: 28 x 24.5 x 7 cm. Skin: There is an attached 16 x 7.5 cm ellipse of skin with a central, unremarkable nipple. There is a suture attached at the superior edge. Tumor/cavity: In the mid central to mid lateral breast there is an ill defined area of palpable nodular fibrous tissue without a discrete mass identified. This area measures approximately 7 x 6 x 3 cm and extends to within 1.5 cm of the deep margin. Within this area there is a 1.5 cm cavitary defect, consistent with a previous biopsy site. There is a silver metallic biopsy clip present at the defect. Uninvolved parenchyma: The remainder of the breast tissue consists of fat and dense white fibrous tissue. Prognostic indicators: Obtain from paraffin blocks if needed. Lymph nodes: There are no lymph nodes identified. Block summary: 13 blocks submitted. A = deep margin at nodular area. B - I = sections of nodular area, sequentially labeled from lateral through medial, margins not included. J = lower medial. K = upper medial. L = lower lateral. M = upper lateral. (GP:eps 12/20/11)  RADIOGRAPHIC STUDIES:  Nm Sentinel Node Inj-no Rpt (breast)  12/19/2011  CLINICAL DATA: breast cancer   Sulfur colloid was injected intradermally by the nuclear medicine  technologist for breast cancer sentinel node localization.      ASSESSMENT: 51 year old female with  #1 stage II invasive ductal carcinoma of the left breast with ductal carcinoma in situ. Patient is status post mastectomy that showed 2 foci of invasive cancer measuring 0.8 0.6 cm. The tumor is ER positive PR positive HER-2/neu amplified at 3.27. Patient is now seen in medical oncology for discussion of adjuvant treatment. As mentioned above her case was  discussed at the multidisciplinary breast conference. Patient is noted to  have one of 4 lymph nodes positive for metastatic disease. This was only axillary sentinel node biopsy. A discussion of whether patient under should undergo a full axillary lymph node dissection did take place but at the end of our extensive discussion it was recommended the patient will proceed with radiation and DOS she would feel the criteria of seed 11. Therefore is excellent lymph node dissection is not being done. Instead the patient will proceed with adjuvant chemotherapy and Herceptin. Her chemotherapy will consist of Taxotere carboplatinum given every 21 days. For the duration of her chemotherapy patient will get Herceptin on a weekly basis. Once she completes the chemotherapy she then will get Herceptin every 3 weeks. After completion of chemotherapy patient will proceed with radiation therapy with concurrent Herceptin. After completion of radiation she will start Herceptin and antiestrogen therapy consisting of either tamoxifen or an aromatase inhibitor.  #2 patient does have history of multiple sclerosis and fatigue related to that. Certainly she will get fatigued from her chemotherapy as well we will keep her neurologist very much in the [place] to help manage her symptoms.  #3 Septic arthritis and bacteremia  PLAN:   1. Due to the infection, she will be treated with Herceptin only every three weeks with chemotherapy on the back burner for the time being.  We will discuss her with Dr. Roselind Messier in radiation oncology for him to dertermine whether radiation could be done at this point.    #2 Patient will return in three week's time for followup. She is also recommended to call us with any problems in the interim.  #3 Pt continues on IV antibiotics for her bacteremia.  She is scheduled to see Dr. Ninetta Lights on 10/23.    All questions were answered. The patient knows to call the clinic with any problems, questions or concerns. We  can certainly see the patient much sooner if necessary.  I spent 25 minutes counseling the patient face to face. The total time spent in the appointment was 30 minutes.    Cherie Ouch Lyn Hollingshead, NP Medical Oncology [facility] Phone: [phone]    03/29/2012, 10:07 AM

## 2012-03-29 NOTE — Patient Instructions (Addendum)
Will proceed with Herceptin and talk to Dr. Roselind Messier.  We will see you back in 3 weeks.   Please call with questions or concerns.

## 2012-03-29 NOTE — Telephone Encounter (Signed)
gve the pt her oct 2013 appt calendar. Pt is aware that her chemo appt will be added to follow the md visit. Sent michelle a staff message.

## 2012-03-30 ENCOUNTER — Ambulatory Visit: Payer: BC Managed Care – PPO

## 2012-03-30 NOTE — Progress Notes (Signed)
OFFICE PROGRESS NOTE  CC  [name] [address] [place] [address] Dr. Cyndia Bent Dr. Richardean Chimera Dr. Mardella Layman Dr. Gustavus Messing ([facility])  DIAGNOSIS: 51 year old female who originally was seen in the multidisciplinary breast clinic for discussion of new diagnosis of left breast cancer. She is now status post left mastectomy with the final pathology revealing an invasive ductal carcinoma that is ER positive PR positive HER-2/neu positive with Ki-67 11%.  PRIOR THERAPY:  #1 patient was originally seen in the multidisciplinary breast clinic on 11/30/2011 for what at that time looked like a ductal carcinoma in situ of the left breast. Due to the extent of disease she was recommended a mastectomy with axillary lymph node sampling.  #2 12/19/2011 patient had a left mastectomy with sentinel lymph node biopsy. Her final pathology revealed 2 foci of invasive ductal carcinoma measuring 0.8 and 0.6 cm. The tumor was low grade ER positive PR positive. Patient's tumor was found to be HER-2/neu positive with HER-2 ratio of 3.27. Ki-67 was 11%. Patient had sentinel lymph node  Biopsies performed one of 4 lymph nodes were positive for metastatic disease. Patient's final pathologic staging is T1 N1 M0. Stage II  #3 patient has received adjuvant chemotherapy consisting of Taxotere carboplatinum and Herceptin for 2 cycles. This was then complicated by development of bacteremia left knee sepsis requiring revision of the knee. Port-A-Cath site infection requiring removal of the Port-A-Cath and placement of a PICC line.  #4 patient will now receive Herceptin every 3 weeks with radiation therapy this is to continue until patient finishes up her antibiotics then we may consider her completing her 2 cycles of Taxotere and carboplatinum combination.  CURRENT THERAPY: Patient will proceed with Herceptin every 3 weeks  INTERVAL HISTORY: Julie Ross 51  y.o. female returns for Followup visit. Overall she is a little bit better she is weak tired fatigued. She's had quite a significant hospitalization course since September including having a septic knee and Port-A-Cath site infection as well as bacteremia. She is currently on antibiotics. She is followed by Dr. Ninetta Lights from infectious diseases. She denies any nausea vomiting fevers chills night sweats headaches no shortness of breath or chest pains just fatigue and weakness. Remainder of the 14 point review of systems is negative. MEDICAL HISTORY: Past Medical History  Diagnosis Date  . Hypertension   . Hypothyroidism   . Arthritis   . Neuromuscular disorder     MS TX WITH CYMBALTA   . Multiple sclerosis   . H/O colonoscopy   . H/O bone density study 03/2011  . Wears glasses   . Heart burn   . Depression   . Sleep apnea     MILD SLEEP APNEA, NO MACHINE  6 YRS AGO HPT [facility]   . Breast cancer     lt breast    ALLERGIES:   has no known allergies.  MEDICATIONS:  Current Outpatient Prescriptions  Medication Sig Dispense Refill  . acetaminophen (TYLENOL) 325 MG tablet Take 1-2 tablets (325-650 mg total) by mouth every 4 (four) hours as needed.      . ALPRAZolam (XANAX) 0.5 MG tablet Take 1 tablet (0.5 mg total) by mouth 3 (three) times daily as needed for anxiety.  15 tablet  0  . atenolol (TENORMIN) 25 MG tablet Take 0.5 tablets (12.5 mg total) by mouth daily. For blood pressure  15 tablet  0  . bacitracin ointment Apply topically 2 (two) times daily.  120 g    . CALCIUM-MAGNESIUM PO Take 1 tablet by mouth daily.      . camphor-menthol (SARNA) lotion Apply topically as needed for itching.  222 mL    . ceFAZolin (ANCEF) 1-5 GM-% Inject 50 mLs (1 g total) into the vein every 8 (eight) hours. Antibiotics to continue through 04/12/12.  Date to be confirmed with ID past follow up.  1800 mL  0  . docusate sodium (COLACE) 100 MG capsule Take 100 mg by mouth daily.      Marland Kitchen esomeprazole  (NEXIUM) 40 MG capsule Take 40 mg by mouth daily before breakfast.      . gabapentin (NEURONTIN) 100 MG capsule Take 1 capsule (100 mg total) by mouth 3 (three) times daily. For neuropathy  90 capsule  1  . hydrochlorothiazide (HYDRODIURIL) 25 MG tablet Take 1 tablet (25 mg total) by mouth daily. For blood pressure.  30 tablet  1  . hydrOXYzine (ATARAX/VISTARIL) 25 MG tablet Take 1 tablet (25 mg total) by mouth 3 (three) times daily as needed for itching.  90 tablet  1  . IRON PO Take 45 mg by mouth.      . losartan (COZAAR) 100 MG tablet Take 1 tablet (100 mg total) by mouth daily. For blood pressure  30 tablet  1  . Multiple Vitamins-Minerals (MULTIVITAMINS THER. W/MINERALS) TABS Take 1 tablet by mouth daily.      . Probiotic Product (PROBIOTIC DAILY PO) Take by mouth.      . prochlorperazine (COMPAZINE) 10 MG tablet Take 10 mg by mouth every 6 (six) hours as needed.      . traMADol (ULTRAM) 50 MG tablet Take 1-2 tablets (50-100 mg total) by mouth every 4 (four) hours as needed. For moderate to severe pain.  100 tablet  0  . warfarin (COUMADIN) 5 MG tablet Take 5 mg by mouth daily. Blood thinner. Take with supper.      . hydrocortisone cream 1 % Apply topically 2 (two) times daily. Apply topically 2 (two) times daily. Apply to upper extremities for  Itching.  30 g    . ondansetron (ZOFRAN) 4 MG tablet Take 1 tablet (4 mg total) by mouth 4 (four) times daily. To help with nausea.  May taper to as needed if stomach symptoms improve  120 tablet  1  . prochlorperazine (COMPAZINE) 10 MG tablet TAKE 1 TABLET BY MOUTH EVERY 6 HOURS AS NEEDED FOR NAUSEA AND VOMITING  30 tablet  0    SURGICAL HISTORY:  Past Surgical History  Procedure Date  . Tonsillectomy   . Tumor removal     FROM PELVIS AGE 49  . Cesarean section     X2   . No past surgeries     BLADDER SLING  4-5 YRS AGO   . Knee arthroscopy     LT KNEE 04/2011  . Total knee arthroplasty 08/15/2011    Procedure: TOTAL KNEE ARTHROPLASTY; lft  Surgeon: Harvie Junior, MD;  Location: [facility] OR;  Service: Orthopedics;  Laterality: Left;  RIGHT KNEE CORTIZONE INJECTION  . Mohs surgery   . Knee arthroscopy 82    rt  . Joint replacement Feb 2013  . Mastectomy w/ sentinel node biopsy 12/19/2011    Procedure: MASTECTOMY WITH SENTINEL LYMPH NODE BIOPSY;  Surgeon: Currie Paris, MD;  Location: [facility] OR;  Service: General;  Laterality: Left;  left breast and left axilla   . Portacath placement 01/16/2012    Procedure: INSERTION PORT-A-CATH;  Surgeon: Currie Paris, MD;  Location: [facility];  Service: General;  Laterality: Right;  Porta Cath Placement   . I&d knee with poly exchange 03/02/2012    Procedure: IRRIGATION AND DEBRIDEMENT KNEE WITH POLY EXCHANGE;  Surgeon: Harvie Junior, MD;  Location: [facility] OR;  Service: Orthopedics;  Laterality: Left;  I&D of total knee with Possible Poly Exchange   . Tee without cardioversion 03/06/2012    Procedure: TRANSESOPHAGEAL ECHOCARDIOGRAM (TEE);  Surgeon: Wendall Stade, MD;  Location: [facility] ENDOSCOPY;  Service: Cardiovascular;  Laterality: N/A;  Rm. [id number]  . Port-a-cath removal 03/06/2012    Procedure: REMOVAL PORT-A-CATH;  Surgeon: Adolph Pollack, MD;  Location: [facility] OR;  Service: General;  Laterality: N/A;    REVIEW OF SYSTEMS:  Pertinent items are noted in HPI.   PHYSICAL EXAMINATION: General appearance: alert, cooperative and appears stated age Neck: no adenopathy, no carotid bruit, no JVD, supple, symmetrical, trachea midline and thyroid not enlarged, symmetric, no tenderness/mass/nodules Lymph nodes: Cervical, supraclavicular, and axillary nodes normal. Resp: clear to auscultation bilaterally and normal percussion bilaterally Back: symmetric, no curvature. ROM normal. No CVA tenderness. Cardio: regular rate and rhythm, S1, S2 normal, no murmur, click, rub or gallop GI: soft, non-tender; bowel sounds normal; no masses,  no organomegaly Extremities: extremities normal, atraumatic, no  cyanosis or edema Neurologic: Grossly normal  ECOG PERFORMANCE STATUS: 1 - Symptomatic but completely ambulatory  Blood pressure 130/84, pulse 60, temperature 97.3 F (36.3 C), resp. rate 20, height 5\' 6"  (1.676 Ross), weight 189 lb 12.8 oz (86.093 kg), last menstrual period 12/05/2011.  LABORATORY DATA: Lab Results  Component Value Date   WBC 7.4 03/29/2012   HGB 10.7* 03/29/2012   HCT 31.9* 03/29/2012   MCV 83.9 03/29/2012   PLT 254 03/29/2012      Chemistry      Component Value Date/Time   NA 134* 03/29/2012 0913   NA 140 03/16/2012 0500   K 3.7 03/29/2012 0913   K 4.0 03/16/2012 0500   CL 98 03/29/2012 0913   CL 98 03/16/2012 0500   CO2 23 03/29/2012 0913   CO2 33* 03/16/2012 0500   BUN 18.0 03/29/2012 0913   BUN 19 03/16/2012 0500   CREATININE 1.2* 03/29/2012 0913   CREATININE 1.87* 03/16/2012 0500      Component Value Date/Time   CALCIUM 9.4 03/29/2012 0913   CALCIUM 9.1 03/16/2012 0500   ALKPHOS 196* 03/29/2012 0913   ALKPHOS 494* 03/09/2012 0535   AST 19 03/29/2012 0913   AST 18 03/09/2012 0535   ALT <6 Repeated and Verified 03/29/2012 0913   ALT 11 03/09/2012 0535   BILITOT 0.90 03/29/2012 0913   BILITOT 0.2* 03/09/2012 0535     DOB: 19-Nov-1960 Age: 36 Gender: F Client Name [facility] Collected Date: 12/19/2011 Received Date: 12/19/2011 Physician: Cyndia Bent Chart #: MRN # : [id number] Physician cc: Richardean Chimera, MD Sherian Rein, MD Race:W Visit #: [id number] Kaylyn Lim, RN REPORT OF SURGICAL PATHOLOGY ADDITIONAL INFORMATION: 5. PROGNOSTIC INDICATORS - ACIS Results IMMUNOHISTOCHEMICAL AND MORPHOMETRIC ANALYSIS BY THE AUTOMATED CELLULAR IMAGING SYSTEM (ACIS) Estrogen Receptor (Negative, <1%): 100%, STRONG STAINING INTENSITY Progesterone Receptor (Negative, <1%): 100%, STRONG STAINING INTENSITY Proliferation Marker Ki67 by Ross IB-1 (Low<20%): 11% All controls stained appropriately Pecola Leisure MD Pathologist, Electronic Signature (  Signed 12/29/2011) 5. CHROMOGENIC IN-SITU HYBRIDIZATION Interpretation: HER2/NEU BY CISH - SHOWS AMPLIFICATION BY CISH ANALYSIS. THE RATIO OF HER2: CEP 17 SIGNALS WAS 3.27 Reference range: Ratio: HER2:CEP17 <  1.8 gene amplification not observed Ratio: HER2:CEP 17 1.8-2.2 - equivocal result Ratio: HER2:CEP17 > 2.2 - gene amplification observed Pecola Leisure MD Pathologist, Electronic Signature ( Signed 12/27/2011) 1 of 4 FINAL for Belcher, Julie Ross ([id number]) FINAL DIAGNOSIS Diagnosis 1. Lymph node, sentinel, biopsy, Left axillary - ONE BENIGN LYMPH NODE WITH NO TUMOR SEEN (0/1). 2. Lymph node, sentinel, biopsy, Left axilla - ONE LYMPH NODE POSITIVE FOR METASTATIC DUCTAL CARCINOMA (1/1). - SEE COMMENT. 3. Lymph node, sentinel, biopsy, Left axilla - ONE BENIGN LYMPH NODE WITH NO TUMOR SEEN (0/1). 4. Lymph node, sentinel, biopsy, Left axilla - ONE BENIGN LYMPH NODE WITH NO TUMOR SEEN (0/1). 5. Breast, simple mastectomy, Left - INVASIVE GRADE I DUCTAL CARCINOMA, TWO FOCI MEASURING 0.8 CM AND 0.6 CM ARISING IN A BACKGROUND OF EXTENSIVE INTERMEDIATE GRADE DUCTAL CARCINOMA IN SITU WITH NECROSIS. - MARGINS ARE NEGATIVE. - SEE ONCOLOGY TEMPLATE. Microscopic Comment 2. On re-review of the frozen section slides, no metastatic carcinoma is identified. The carcinoma present is on permanent sections only. 5. BREAST, INVASIVE TUMOR, WITH LYMPH NODE SAMPLING Specimen, including laterality: Left breast with sentinel lymph node. Procedure: Left simple mastectomy with sentinel lymph node biopsy. Grade: I (Both foci of tumors show similar morphology). Tubule formation: 2. Nuclear pleomorphism: 2. Mitotic: 1. Tumor size (glass slide measurement): Two foci measuring 0.8 cm and 0.6 cm. Margins: Invasive, distance to closest margin: At least 1.5 cm. In-situ, distance to closest margin: At least 1.5 cm. Lymphovascular invasion: No definite lymph/vascular invasion is not identified however a sentinel  lymph node is positive (see below). Ductal carcinoma in situ: Yes. Grade: Intermediate grade. Extensive intraductal component: Yes. Lobular neoplasia: Not identified. Tumor focality: Two foci. Treatment effect: Not applicable. Extent of tumor: Tumor confined to breast parenchyma. Lymph nodes: # examined: 4. Lymph nodes with metastasis: 1. Macrometastasis: (> 2.0 mm): 1. Extracapsular extension: Not identified. Breast prognostic profile: A breast prognostic profile will be performed (see below comment). Additional breast findings: Fibrocystic changes with usual ductal hyperplasia. TNM: mpT1b, pN1a, MX. Comment: The palpable nodular area seen grossly is comprised predominantly of intermediate grade ductal carcinoma in situ with only two foci of invasive ductal carcinoma. Both invasive foci are morphologically similar. As such a breast prognostic profile will only be performed on the larger tumor. A breast prognostic profile can be performed upon the smaller tumor per request. (RH:kh 12-21-11) 2 of 4 FINAL for Emmitt, Niquita Ross ([id number]) Microscopic Comment(continued) Zandra Abts MD Pathologist, Electronic Signature (Case signed 12/21/2011) Intraoperative Diagnosis 1. RECEIVED FRESH IS TISSUE THAT CONTAINS A LYMPH NODE WHICH IS SAMPLED FOR RIOC. LEFT AXILLARY SENTINEL LYMPH NODE, FROZEN SECTION AND TOUCH PREPARATION- ONE LYMPH NODE WITH NO METASTATIC CARCINOMA IDENTIFIED. (RAH) 2. RECEIVED FRESH IS TISSUE THAT CONTAINS A LYMPH NODE WHICH IS SAMPLED FOR RIOC. LEFT AXILLARY SENTINEL LYMPH NODE, FROZEN SECTION AND TOUCH PREPARATION- ONE LYMPH NODE WITH NO METASTATIC CARCINOMA IDENTIFIED. (RAH) 3. RECEIVED FRESH IS TISSUE THAT CONTAINS A LYMPH NODE WHICH IS SAMPLED FOR RIOC. LEFT AXILLARY SENTINEL LYMPH NODE, FROZEN SECTION AND TOUCH PREPARATION- ONE LYMPH NODE WITH NO METASTATIC CARCINOMA IDENTIFIED. (RAH) 4. RECEIVED FRESH IS TISSUE THAT CONTAINS A LYMPH NODE WHICH IS SAMPLED FOR  RIOC. LEFT AXILLARY SENTINEL LYMPH NODE, FROZEN SECTION AND TOUCH PREPARATION- ONE LYMPH NODE WITH NO METASTATIC CARCINOMA IDENTIFIED. (RAH) Specimen Gross and Clinical Information Specimen(s) Obtained: 1. Lymph node, sentinel, biopsy, Left axillary 2. Lymph node, sentinel, biopsy, Left axilla 3. Lymph node, sentinel, biopsy, Left axilla 4. Lymph node, sentinel, biopsy, Left  axilla 5. Breast, simple mastectomy, Left Specimen Clinical Information 1. Left breast cancer (tl) Gross 1. Rapid Intraoperative Consult performed (Yes or No): Yes. Specimen: Left axillary sentinel node. Number and size: One node, 2.2 cm Cut Surface(s): Soft, fatty, focally blue. Block Summary: The node is sectioned, touch preparations are made on one slide, and the tissue is submitted in one block labeled SLN for frozen section. 2. Rapid Intraoperative Consult performed (Yes or No): Yes. Specimen: Left axillary sentinel node. Number and size: One node, 2.1 cm. Cut Surface(s): Soft, fatty, focally blue. Block Summary: The node is bisected, touch preparations are made on one slide, and is submitted in one block labeled SLN for frozen section. 3. Rapid Intraoperative Consult performed (Yes or No): Yes. Specimen: Left axillary sentinel node. Number and size: One node, 0.7 cm Cut Surface(s): Soft, fatty, focally blue. Block Summary: The node is bisected, touch preparations are made on one slide, and is submitted in one block labeled SLN for frozen section. 4. Rapid Intraoperative Consult performed (Yes or No): Yes. Specimen: Left axillary sentinel node. Number and size: One node, 1.5 cm. 3 of 4 FINAL for Eschbach, Areyanna Ross ([id number]) Gross(continued) Cut Surface(s): Soft, fatty, non blue. Block Summary: The node is bisected, touch preparations are made on one slide and is submitted in one block labeled SLN for frozen section. (SW:gt, 12/20/11) 5. Specimen: Left simple mastectomy, received fresh. The specimen was  placed in formalin at 2:50 p.Ross. on 12/19/11. Specimen integrity (intact/disrupted): Intact. Weight: 1185 grams. Size: 28 x 24.5 x 7 cm. Skin: There is an attached 16 x 7.5 cm ellipse of skin with a central, unremarkable nipple. There is a suture attached at the superior edge. Tumor/cavity: In the mid central to mid lateral breast there is an ill defined area of palpable nodular fibrous tissue without a discrete mass identified. This area measures approximately 7 x 6 x 3 cm and extends to within 1.5 cm of the deep margin. Within this area there is a 1.5 cm cavitary defect, consistent with a previous biopsy site. There is a silver metallic biopsy clip present at the defect. Uninvolved parenchyma: The remainder of the breast tissue consists of fat and dense white fibrous tissue. Prognostic indicators: Obtain from paraffin blocks if needed. Lymph nodes: There are no lymph nodes identified. Block summary: 13 blocks submitted. A = deep margin at nodular area. B - I = sections of nodular area, sequentially labeled from lateral through medial, margins not included. J = lower medial. K = upper medial. L = lower lateral. Ross = upper lateral. (GP:eps 12/20/11)  RADIOGRAPHIC STUDIES:  Nm Sentinel Node Inj-no Rpt (breast)  12/19/2011  CLINICAL DATA: breast cancer   Sulfur colloid was injected intradermally by the nuclear medicine  technologist for breast cancer sentinel node localization.      ASSESSMENT: 51 year old female with  #1 stage II invasive ductal carcinoma of the left breast with ductal carcinoma in situ. Patient is status post mastectomy that showed 2 foci of invasive cancer measuring 0.8 0.6 cm. The tumor is ER positive PR positive HER-2/neu amplified at 3.27. Patient is now seen in medical oncology for discussion of adjuvant treatment. As mentioned above her case was discussed at the multidisciplinary breast conference. Patient is noted to have one of 4 lymph nodes positive for metastatic  disease. This was only axillary sentinel node biopsy. A discussion of whether patient under should undergo a full axillary lymph node dissection did take place but at the end of our  extensive discussion it was recommended the patient will proceed with radiation and DOS she would feel the criteria of seed 11. Therefore is excellent lymph node dissection is not being done. Instead the patient will proceed with adjuvant chemotherapy and Herceptin. Her chemotherapy will consist of Taxotere carboplatinum given every 21 days. For the duration of her chemotherapy patient will get Herceptin on a weekly basis. Once she completes the chemotherapy she then will get Herceptin every 3 weeks. After completion of chemotherapy patient will proceed with radiation therapy with concurrent Herceptin. After completion of radiation she will start Herceptin and antiestrogen therapy consisting of either tamoxifen or an aromatase inhibitor.  #2 at this time we will hold patient's Taxotere and carboplatinum since patient has had a significant amount of complications from bacteremia requiring Port-A-Cath removal knee revision. However she will continue Herceptin every 3 weeks now. She will also proceed to radiation therapy and I have sent a note to Dr. Sharlett Iles. Once she completes radiation then I will plan on getting her started on antiestrogen therapy with tamoxifen since she is premenopausal.   PLAN:  #1 proceed with Herceptin every 3 weeks.  #2 referral back to radiation oncology for radiation to the left chest wall and axilla.  #3 she will be seen back in 3 weeks' time in followup.  All questions were answered. The patient knows to call the clinic with any problems, questions or concerns. We can certainly see the patient much sooner if necessary.  I spent 25 minutes counseling the patient face to face. The total time spent in the appointment was 30 minutes.    Drue Second, MD Medical/Oncology [facility] [phone] (beeper) [phone] (Office)  03/30/2012, 4:19 PM

## 2012-03-31 ENCOUNTER — Ambulatory Visit: Payer: BC Managed Care – PPO

## 2012-04-02 ENCOUNTER — Encounter: Payer: Self-pay | Admitting: Radiation Oncology

## 2012-04-02 ENCOUNTER — Ambulatory Visit
Admission: RE | Admit: 2012-04-02 | Discharge: 2012-04-02 | Disposition: A | Discharge status: Home / Self Care | Payer: BC Managed Care – PPO | Source: Ambulatory Visit | Attending: Radiation Oncology | Admitting: Radiation Oncology

## 2012-04-02 VITALS — BP 152/99 | HR 65 | Temp 98.1°F | Wt 190.6 lb

## 2012-04-02 DIAGNOSIS — L589 Radiodermatitis, unspecified: Secondary | ICD-10-CM | POA: Insufficient documentation

## 2012-04-02 DIAGNOSIS — R5381 Other malaise: Secondary | ICD-10-CM | POA: Insufficient documentation

## 2012-04-02 DIAGNOSIS — R5383 Other fatigue: Secondary | ICD-10-CM | POA: Insufficient documentation

## 2012-04-02 DIAGNOSIS — Y842 Radiological procedure and radiotherapy as the cause of abnormal reaction of the patient, or of later complication, without mention of misadventure at the time of the procedure: Secondary | ICD-10-CM | POA: Insufficient documentation

## 2012-04-02 DIAGNOSIS — L299 Pruritus, unspecified: Secondary | ICD-10-CM | POA: Insufficient documentation

## 2012-04-02 DIAGNOSIS — C50419 Malignant neoplasm of upper-outer quadrant of unspecified female breast: Secondary | ICD-10-CM | POA: Insufficient documentation

## 2012-04-02 DIAGNOSIS — Z510 Encounter for antineoplastic radiation therapy: Secondary | ICD-10-CM | POA: Insufficient documentation

## 2012-04-02 NOTE — Progress Notes (Signed)
Please see the Nurse Progress Note in the MD Initial Consult Encounter for this patient. 

## 2012-04-02 NOTE — Progress Notes (Signed)
Radiation Oncology         [phone] ________________________________  Name: Julie Ross MRN: [id number]  Date: 04/02/2012  DOB: 03/10/1961  Reevaluation note  CC: Jannifer Rodney, MD  Diagnosis:   Multifocal stage II invasive ductal carcinoma of the left breast    Narrative:  The patient returns today for further evaluation. Patient was seen in the multidisciplinary breast clinic on 11/30/2011 by myself along with Dr. Welton Flakes and Dr. Jamey Ripa.  At that time the patient had a diagnosis of intraductal breast cancer.  Given the size of the lesion she proceeded to undergo a mastectomy and sentinel node procedure. Upon pathologic review the patient was found to have multifocal invasive ductal carcinoma with the largest lesion measuring 0.8 cm. These lesions are arising in a background of an extensive intraductal intermediate grade carcinoma.  Sentinel noted specimens showed one out of 4 lymph nodes positive for metastatic ductal carcinoma. In light of the above findings the patient was felt to be a good candidate for adjuvant chemotherapy. The patient began treatment with Taxotere and carboplatinum. Patient's treatment was interrupted after her second cycle for septic left knee and presumed infection of her Port-A-Cath site which is been removed.  She is now seen to be considered for postmastectomy irradiation.                              ALLERGIES:   has no known allergies.  Meds: Current Outpatient Prescriptions  Medication Sig Dispense Refill  . acetaminophen (TYLENOL) 325 MG tablet Take 1-2 tablets (325-650 mg total) by mouth every 4 (four) hours as needed.      . ALPRAZolam (XANAX) 0.5 MG tablet Take 1 tablet (0.5 mg total) by mouth 3 (three) times daily as needed for anxiety.  15 tablet  0  . atenolol (TENORMIN) 25 MG tablet Take 0.5 tablets (12.5 mg total) by mouth daily. For blood pressure  15 tablet  0  . bacitracin ointment Apply topically 2 (two) times daily.  120  g    . CALCIUM-MAGNESIUM PO Take 1 tablet by mouth daily.      . camphor-menthol (SARNA) lotion Apply topically as needed for itching.  222 mL    . ceFAZolin (ANCEF) 1-5 GM-% Inject 50 mLs (1 g total) into the vein every 8 (eight) hours. Antibiotics to continue through 04/12/12.  Date to be confirmed with ID past follow up.  1800 mL  0  . docusate sodium (COLACE) 100 MG capsule Take 100 mg by mouth daily.      Marland Kitchen gabapentin (NEURONTIN) 100 MG capsule Take 1 capsule (100 mg total) by mouth 3 (three) times daily. For neuropathy  90 capsule  1  . hydrochlorothiazide (HYDRODIURIL) 25 MG tablet Take 1 tablet (25 mg total) by mouth daily. For blood pressure.  30 tablet  1  . hydrocortisone cream 1 % Apply topically 2 (two) times daily. Apply topically 2 (two) times daily. Apply to upper extremities for  Itching.  30 g    . hydrOXYzine (ATARAX/VISTARIL) 25 MG tablet Take 1 tablet (25 mg total) by mouth 3 (three) times daily as needed for itching.  90 tablet  1  . IRON PO Take 45 mg by mouth.      . losartan (COZAAR) 100 MG tablet Take 1 tablet (100 mg total) by mouth daily. For blood pressure  30 tablet  1  . Probiotic Product (PROBIOTIC  DAILY PO) Take by mouth.      . prochlorperazine (COMPAZINE) 10 MG tablet TAKE 1 TABLET BY MOUTH EVERY 6 HOURS AS NEEDED FOR NAUSEA AND VOMITING  30 tablet  0  . traMADol (ULTRAM) 50 MG tablet Take 1-2 tablets (50-100 mg total) by mouth every 4 (four) hours as needed. For moderate to severe pain.  100 tablet  0  . esomeprazole (NEXIUM) 40 MG capsule Take 40 mg by mouth daily before breakfast.      . Multiple Vitamins-Minerals (MULTIVITAMINS THER. W/MINERALS) TABS Take 1 tablet by mouth daily.      . prochlorperazine (COMPAZINE) 10 MG tablet Take 10 mg by mouth every 6 (six) hours as needed.        Physical Findings: The patient is in no acute distress. Patient is alert and oriented.  weight is 190 lb 9.6 oz (86.456 kg). Her temperature is 98.1 F (36.7 C). Her blood  pressure is 152/99 and her pulse is 65. Marland Kitchen No palpable cervical supraclavicular or axillary adenopathy.  The lungs are clear to auscultation. The heart has regular rhythm and rate. Examination of the right breast reveals no mass or nipple discharge. Examination of the left chest wall area reveals a mastectomy scar which is healing well without signs of drainage or infection.  No signs of recurrence.  Lab Findings: Lab Results  Component Value Date   WBC 7.4 03/29/2012   HGB 10.7* 03/29/2012   HCT 31.9* 03/29/2012   MCV 83.9 03/29/2012   PLT 254 03/29/2012    @LASTCHEM @  Radiographic Findings: Dg Abd 1 View  03/08/2012  *RADIOLOGY REPORT*  Clinical Data: Abdominal pain.  Rule out ileus.  ABDOMEN - 1 VIEW  Comparison: None available.  Findings: The bowel gas pattern is unremarkable.  There is no evidence for obstruction or free air.  The axial skeleton is unremarkable.  IMPRESSION: Negative abdomen.   Original Report Authenticated By: Jamesetta Orleans. MATTERN, M.D.    Ct Head Wo Contrast  03/12/2012  *RADIOLOGY REPORT*  Clinical Data: 51 year old female with confusion, dizziness.  CT HEAD WITHOUT CONTRAST  Technique:  Contiguous axial images were obtained from the base of the skull through the vertex without contrast.  Comparison: 03/05/2012. Brain MRI report [facility] 01/26/2010 (no images available).   .  Findings: Visualized paranasal sinuses and mastoids are clear.  No acute osseous abnormality identified.  Visualized orbits and scalp soft tissues are within normal limits.  Stable calcified mildly prominent pineal gland.  No definite mass effect on adjacent structures. This was described on the 01/26/2010 report.  No other intracranial mass lesion.  No midline shift.  No ventriculomegaly. No evidence of cortically based acute infarction identified.  No acute intracranial hemorrhage identified.  No suspicious intracranial vascular hyperdensity.  IMPRESSION: 1.  No acute intracranial  abnormality. 2.  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   Original Report Authenticated By: Harley Hallmark, M.D.    Ct Head Wo Contrast  03/05/2012  *RADIOLOGY REPORT*  Clinical Data:  Altered mental status.  Breast cancer.  CT HEAD WITHOUT CONTRAST  Technique:  Contiguous axial images were obtained from the base of the skull through the vertex without contrast.  Comparison: None.  Findings: There is no evidence for acute infarction, intracranial hemorrhage, mass lesion, hydrocephalus, or extra-axial fluid. Borderline mild atrophy.  No definite white matter disease.  No osseous destructive lesion.  Sinuses and mastoids clear.  IMPRESSION: Borderline mild atrophy.  No acute intracranial findings.   Original  Report Authenticated By: Elsie Stain, M.D.    Dg Chest Port 1 View  03/09/2012  *RADIOLOGY REPORT*  Clinical Data: Right PICC placement  PORTABLE CHEST - 1 VIEW  Comparison: 03/04/2012  Findings: Right PICC terminates in the upper right atrium, 2 cm below the cavoatrial junction.  Mild interstitial edema. No pleural effusion or pneumothorax.  The heart is top normal in size/mildly enlarged.  IMPRESSION: Right PICC terminates in the upper right atrium, 2 cm below the cavoatrial junction.  Borderline cardiomegaly with mild interstitial edema.   Original Report Authenticated By: Charline Bills, M.D.    Dg Chest Port 1 View  03/04/2012  *RADIOLOGY REPORT*  Clinical Data: Bacteremia.  Infected knee prosthesis.  Acute renal insufficiency.  PORTABLE CHEST - 1 VIEW 03/04/2012 1309 hours:  Comparison: Portable chest x-ray 01/16/2012 and two-view chest x- ray 08/11/2011.  Findings: Suboptimal inspiration due to body habitus which accounts for crowded bronchovascular markings at the bases and accentuates the cardiac silhouette.  Taking this into account, cardiac silhouette enlarged but stable.  Lungs clear.  Pulmonary venous hypertension without overt edema.  Right subclavian Port-A-Cath tip  projects over the lower SVC.  IMPRESSION: Suboptimal inspiration.  Stable cardiomegaly.  No acute cardiopulmonary disease.   Original Report Authenticated By: Arnell Sieving, M.D.     Impression:  Stage II multifocal invasive ductal carcinoma of the left breast, arising in a background of extensive intraductal breast cancer.  As above on sentinel node the patient did have a positive axillary node. Options to consider would be to proceed with additional surgery directed at the axilla or to cover this area with the radiation therapy. Patient does wish to proceed with radiation therapy as part of her management with treatments encompassing the left chest wall, axillary and supraclavicular region.  I did speak with Dr. Welton Flakes concerning proceeding with radiation therapy at this time prior to the patient's last 2 cycles of chemotherapy.   She does recommend this so the patient can avoid neutropenia in light of her recent issues with Port-A-Cath site infection as well as bacteremia.  Plan:  CT simulation in the near future  _____________________________________    Billie Lade, PhD, MD

## 2012-04-02 NOTE — Progress Notes (Signed)
Patient here today with daughter for radiation consultation of left breast cancer post chemotherapy of taxotere and carboplatin and left simple mastectomy. Main side effects are nausea and mild tingling of finger tips.Menses at age 51. First child age 18.Two daughters ages 15 and 56.Ambulates with walker. Currently gives self iv ancef through picc line for sepsis left knee infection or right chest porta-cath which has been removed.

## 2012-04-03 NOTE — Addendum Note (Signed)
Encounter addended by: Delynn Flavin, RN on: 04/03/2012 10:44 AM<BR>     Documentation filed: Charges VN

## 2012-04-03 NOTE — Addendum Note (Signed)
Encounter addended by: Delynn Flavin, RN on: 04/03/2012 12:43 PM<BR>     Documentation filed: Charges VN

## 2012-04-06 ENCOUNTER — Ambulatory Visit: Payer: BC Managed Care – PPO | Admitting: Oncology

## 2012-04-06 ENCOUNTER — Other Ambulatory Visit: Payer: BC Managed Care – PPO | Admitting: Lab

## 2012-04-06 ENCOUNTER — Ambulatory Visit: Payer: BC Managed Care – PPO

## 2012-04-09 ENCOUNTER — Other Ambulatory Visit: Payer: Self-pay | Admitting: Oncology

## 2012-04-09 ENCOUNTER — Ambulatory Visit
Admission: RE | Admit: 2012-04-09 | Discharge: 2012-04-09 | Disposition: A | Discharge status: Home / Self Care | Payer: BC Managed Care – PPO | Source: Ambulatory Visit | Attending: Radiation Oncology | Admitting: Radiation Oncology

## 2012-04-09 DIAGNOSIS — C50419 Malignant neoplasm of upper-outer quadrant of unspecified female breast: Secondary | ICD-10-CM

## 2012-04-10 ENCOUNTER — Encounter: Payer: Self-pay | Admitting: Oncology

## 2012-04-10 ENCOUNTER — Other Ambulatory Visit: Payer: Self-pay | Admitting: Oncology

## 2012-04-10 NOTE — Progress Notes (Signed)
Put cancer form on nurse's desk. °

## 2012-04-11 ENCOUNTER — Ambulatory Visit (INDEPENDENT_AMBULATORY_CARE_PROVIDER_SITE_OTHER): Payer: BC Managed Care – PPO | Admitting: Internal Medicine

## 2012-04-11 ENCOUNTER — Encounter: Payer: Self-pay | Admitting: Internal Medicine

## 2012-04-11 VITALS — BP 161/95 | HR 60 | Temp 97.8°F | Ht 66.0 in | Wt 193.5 lb

## 2012-04-11 DIAGNOSIS — T8450XA Infection and inflammatory reaction due to unspecified internal joint prosthesis, initial encounter: Secondary | ICD-10-CM

## 2012-04-11 DIAGNOSIS — Z23 Encounter for immunization: Secondary | ICD-10-CM

## 2012-04-11 DIAGNOSIS — Z96659 Presence of unspecified artificial knee joint: Secondary | ICD-10-CM

## 2012-04-11 DIAGNOSIS — T8459XA Infection and inflammatory reaction due to other internal joint prosthesis, initial encounter: Secondary | ICD-10-CM

## 2012-04-11 MED ORDER — CEFAZOLIN SODIUM 1-5 GM-% IV SOLN: 1.0000 g | Freq: Three times a day (TID) | INTRAVENOUS | Status: AC

## 2012-04-11 MED ORDER — CEPHALEXIN 500 MG PO CAPS: 500.0000 mg | ORAL_CAPSULE | Freq: Four times a day (QID) | ORAL | Status: DC

## 2012-04-11 NOTE — Progress Notes (Signed)
Lab draw from PICC for sed rate and CRP.  Patient identified with name and DOB. Donned gloves. Cleaned tubing/hub connection cap with CHG wipe for 20 seconds, using scrubbing motion. Attached empty, sterile 10 cc syringe, opened clamp, withdrew 10 cc of waste and set aside. Attached next syringe and withdrew 12 cc of blood, transferred to lab tube.

## 2012-04-11 NOTE — Progress Notes (Signed)
Patient ID: Julie Ross, female   DOB: [birth year], 51 y.o.   MRN: [id number]    [facility] for Infectious Disease  Patient Active Problem List  Diagnosis  . Osteoarthritis of both knees  . Osteoarthritis of left knee  . Cancer of upper-outer quadrant of female breast  . Multiple sclerosis  . Dehydration  . Seroma, postoperative  . Infected prosthetic knee joint  . Hypokalemia  . Hyponatremia  . Bacteremia  . Septic joint of left knee joint    Patient's Medications  New Prescriptions   CEPHALEXIN (KEFLEX) 500 MG CAPSULE    Take 1 capsule (500 mg total) by mouth 4 (four) times daily.  Previous Medications   ACETAMINOPHEN (TYLENOL) 325 MG TABLET    Take 1-2 tablets (325-650 mg total) by mouth every 4 (four) hours as needed.   ALPRAZOLAM (XANAX) 0.5 MG TABLET    Take 1 tablet (0.5 mg total) by mouth 3 (three) times daily as needed for anxiety.   ATENOLOL (TENORMIN) 25 MG TABLET    Take 0.5 tablets (12.5 mg total) by mouth daily. For blood pressure   BACITRACIN OINTMENT    Apply topically 2 (two) times daily.   CALCIUM-MAGNESIUM PO    Take 1 tablet by mouth daily.   DOCUSATE SODIUM (COLACE) 100 MG CAPSULE    Take 100 mg by mouth daily.   ESOMEPRAZOLE (NEXIUM) 40 MG CAPSULE    Take 40 mg by mouth daily before breakfast.   GABAPENTIN (NEURONTIN) 100 MG CAPSULE    Take 1 capsule (100 mg total) by mouth 3 (three) times daily. For neuropathy   HYDROCHLOROTHIAZIDE (HYDRODIURIL) 25 MG TABLET    Take 1 tablet (25 mg total) by mouth daily. For blood pressure.   HYDROCODONE-ACETAMINOPHEN (NORCO/VICODIN) 5-325 MG PER TABLET    Take 1-2 tablets by mouth every 6 (six) hours as needed.   HYDROXYZINE (ATARAX/VISTARIL) 25 MG TABLET    Take 1 tablet (25 mg total) by mouth 3 (three) times daily as needed for itching.   IRON PO    Take 45 mg by mouth.   LOSARTAN (COZAAR) 100 MG TABLET    Take 1 tablet (100 mg total) by mouth daily. For blood pressure   MULTIPLE VITAMINS-MINERALS (MULTIVITAMINS THER.  W/MINERALS) TABS    Take 1 tablet by mouth daily.   PROBIOTIC PRODUCT (PROBIOTIC DAILY PO)    Take by mouth.   PROCHLORPERAZINE (COMPAZINE) 10 MG TABLET    Take 10 mg by mouth every 6 (six) hours as needed.   TRAMADOL (ULTRAM) 50 MG TABLET    Take 1-2 tablets (50-100 mg total) by mouth every 4 (four) hours as needed. For moderate to severe pain.  Modified Medications   Modified Medication Previous Medication   CEFAZOLIN (ANCEF) 1-5 GM-% ceFAZolin (ANCEF) 1-5 GM-%      Inject 50 mLs (1 g total) into the vein every 8 (eight) hours. Antibiotics to continue through 04/12/12.  Date to be confirmed with ID past follow up.    Inject 50 mLs (1 g total) into the vein every 8 (eight) hours. Antibiotics to continue through 04/12/12.  Date to be confirmed with ID past follow up.  Discontinued Medications   CAMPHOR-MENTHOL (SARNA) LOTION    Apply topically as needed for itching.   HYDROCORTISONE CREAM 1 %    Apply topically 2 (two) times daily. Apply topically 2 (two) times daily. Apply to upper extremities for  Itching.   PROCHLORPERAZINE (COMPAZINE) 10 MG TABLET    TAKE  1 TABLET BY MOUTH EVERY 6 HOURS AS NEEDED FOR NAUSEA AND VOMITING    Subjective: Julie Ross is in for her hospital followup visit. She had recently been undergoing treatment for her newly diagnosed breast cancer when she developed MSSA bacteremia and left prosthetic knee infection. She underwent incision and drainage of her left knee with poly-exchange in early September and has now completed 41 days of IV antibiotic therapy. She is currently on Ancef. She has not had any problems tolerating Ancef were her PICC. The PICC is also being used for her Herceptin therapy. She is feeling much better. She's been able to resume more of her usual activities. A transesophageal echocardiogram was negative for any evidence of endocarditis in the hospital. Repeat blood cultures returned negative within 48 hours.  Objective: Temp: 97.8 F (36.6 C) (10/23  1415) Temp src: Oral (10/23 1415) BP: 161/95 mmHg (10/23 1415) Pulse Rate: 60  (10/23 1415)  General: She is in good spirits Skin: Right upper chest Port-A-Cath site has healed nicely. Her right arm PICC site appears normal. Lungs: Clear Cor: Regular S1-S2 no murmurs Her left knee incision is healing well. Her left knee remains slightly warm and swollen.  Lab Results  Component Value Date   CRP 26.2* 03/02/2012    Lab Results  Component Value Date   ESRSEDRATE 72* 03/02/2012       Assessment: She appears to be responding well to surgery and antibiotic therapy for MSSA bacteremia and prosthetic knee infection. I will change from IV Ancef to oral Keflex tomorrow and plan on continuing therapy for at least 6 months total. I will leave her PICC line in for her chemotherapy.  Plan: 1. Change to oral Keflex tomorrow 2. Influenza vaccination today 3. Repeat sedimentation rate and C-reactive protein 4. Followup in 6 weeks   Cliffton Asters, MD [facility] for Infectious Disease [facility] [phone] pager   [phone] cell 04/11/2012, 2:41 PM

## 2012-04-12 LAB — SEDIMENTATION RATE: Sed Rate: 122 mm/hr — ABNORMAL HIGH (ref 0–22)

## 2012-04-12 LAB — C-REACTIVE PROTEIN: CRP: 4.6 mg/dL — ABNORMAL HIGH (ref <0.60)

## 2012-04-12 NOTE — Progress Notes (Signed)
Name: Julie Ross   MRN: [id number]  Date:  04/12/2012  DOB: 02-Jun-1961  Status:outpatient, simulation date 04/09/12    DIAGNOSIS: Breast cancer.  CONSENT VERIFIED: yes   SET UP: Patient is setup supine   IMMOBILIZATION:  The following immobilization was used:Custom Moldable Pillow, breast board.   NARRATIVE: Ms. Branagan was brought to the CT Simulation planning suite.  Identity was confirmed.  All relevant records and images related to the planned course of therapy were reviewed.  Then, the patient was positioned in a stable reproducible clinical set-up for radiation therapy.  Wires were placed to delineate the clinical extent of treatment area . A wire was placed on the  Mastectomy scar as well.  CT images were obtained.  An isocenter was placed. Skin markings were placed.  The position of the heart was then analyzed.  Due to the proximity of the heart to the chest wall, I felt she would benefit from deep inspiration breath hold for cardiac sparing.  She was then coached and rescanned in the breath hold position.  Acceptable cardiac sparing was achieved. The CT images were loaded into the planning software where the target and avoidance structures were contoured.  The radiation prescription was entered and confirmed. The patient was discharged in stable condition and tolerated simulation well.    TREATMENT PLANNING NOTE:  Treatment planning then occurred. I have requested : MLC's, isodose plan, 3-D planning, dose calc. Plan is for 4500 cGy with four field setup, boost to chest wall/mastectomy scar to 5940 cGy  ________________________  Billie Lade,  M.D.

## 2012-04-16 ENCOUNTER — Ambulatory Visit: Payer: BC Managed Care – PPO | Admitting: Radiation Oncology

## 2012-04-16 ENCOUNTER — Telehealth: Payer: Self-pay | Admitting: *Deleted

## 2012-04-16 ENCOUNTER — Ambulatory Visit
Admission: RE | Admit: 2012-04-16 | Discharge: 2012-04-16 | Disposition: A | Discharge status: Home / Self Care | Payer: BC Managed Care – PPO | Source: Ambulatory Visit | Attending: Radiation Oncology | Admitting: Radiation Oncology

## 2012-04-16 DIAGNOSIS — C50419 Malignant neoplasm of upper-outer quadrant of unspecified female breast: Secondary | ICD-10-CM

## 2012-04-16 NOTE — Progress Notes (Signed)
  Radiation Oncology         [phone] ________________________________  Name: Julie Ross MRN: [id number]  Date: 04/16/2012  DOB: Oct 01, 1960  Simulation Verification Note  Status: outpatient  NARRATIVE: The patient was brought to the treatment unit and placed in the planned treatment position. The clinical setup was verified. Then port films were obtained and uploaded to the radiation oncology medical record software.  The treatment beams were carefully compared against the planned radiation fields. The position location and shape of the radiation fields was reviewed. They targeted volume of tissue appears to be appropriately covered by the radiation beams. Organs at risk appear to be excluded as planned.  Based on my personal review, I approved the simulation verification. The patient's treatment will proceed as planned.  -----------------------------------  Billie Lade, PhD, MD

## 2012-04-16 NOTE — Telephone Encounter (Signed)
Call from [place], [place] with [facility]. Pt IV Antibiotics d/c's by Infection Control MD. Pt currently on PO antibiotics. Pt's PICC line flushed and dressing change 10/28 by [facility]. Pt to come in [place] Wed,Fri for picc line flush and q7days for Dressing change. Onc tx sent

## 2012-04-16 NOTE — Telephone Encounter (Signed)
lmonvm adviisng the pt of her picc flush appts starting this wed 04/18/2012 and to pick up her schedule when she comes in the bldg today

## 2012-04-17 ENCOUNTER — Encounter: Payer: Self-pay | Admitting: Oncology

## 2012-04-17 ENCOUNTER — Ambulatory Visit
Admission: RE | Admit: 2012-04-17 | Discharge: 2012-04-17 | Disposition: A | Discharge status: Home / Self Care | Payer: BC Managed Care – PPO | Source: Ambulatory Visit | Attending: Radiation Oncology | Admitting: Radiation Oncology

## 2012-04-17 ENCOUNTER — Ambulatory Visit: Payer: BC Managed Care – PPO

## 2012-04-17 VITALS — BP 145/97 | HR 55 | Temp 97.9°F | Wt 192.2 lb

## 2012-04-17 DIAGNOSIS — C50419 Malignant neoplasm of upper-outer quadrant of unspecified female breast: Secondary | ICD-10-CM

## 2012-04-17 NOTE — Progress Notes (Signed)
Patient  Here for routine weekly under treat visist for breast cancer.Has completed first treatment today.Routine of clinic reviewed.Will go over skin care and side effects formally tomorrow.

## 2012-04-17 NOTE — Progress Notes (Signed)
Faxed cancer form to [employer] @ [phone] °

## 2012-04-17 NOTE — Progress Notes (Signed)
[facility]    Radiation Oncology [address] [place]     Maryln Gottron, M.D. [place], [place] [address]               Billie Lade, M.D., Ph.D. Phone: [phone]      Molli Hazard A. Kathrynn Running, M.D. Fax: [phone]      Radene Gunning, M.D., Ph.D.         Lurline Hare, M.D.         Grayland Jack, M.D Weekly Treatment Management Note  Name: Julie Ross     MRN: [id number]        CSN: [id number] Date: 04/17/2012      DOB: 02-14-1961  CC: [name]    Status: Outpatient  Diagnosis: The encounter diagnosis was Cancer of upper-outer quadrant of female breast.  Current Dose: 180 cGy  Current Fraction: 1  Planned Dose: 5940 cGy  Narrative: [name] was seen today for weekly treatment management. The chart was checked and port films  were reviewed. She is tolerating her treatments well at this time without any side effects.  Review of patient's allergies indicates no known allergies. Current Outpatient Prescriptions  Medication Sig Dispense Refill  . acetaminophen (TYLENOL) 325 MG tablet Take 1-2 tablets (325-650 mg total) by mouth every 4 (four) hours as needed.      . ALPRAZolam (XANAX) 0.5 MG tablet Take 1 tablet (0.5 mg total) by mouth 3 (three) times daily as needed for anxiety.  15 tablet  0  . atenolol (TENORMIN) 25 MG tablet Take 0.5 tablets (12.5 mg total) by mouth daily. For blood pressure  15 tablet  0  . bacitracin ointment Apply topically 2 (two) times daily.  120 g    . CALCIUM-MAGNESIUM PO Take 1 tablet by mouth daily.      . cephALEXin (KEFLEX) 500 MG capsule Take 1 capsule (500 mg total) by mouth 4 (four) times daily.  120 capsule  4  . docusate sodium (COLACE) 100 MG capsule Take 100 mg by mouth daily.      Marland Kitchen esomeprazole (NEXIUM) 40 MG capsule Take 40 mg by mouth daily before breakfast.      . gabapentin (NEURONTIN) 100 MG capsule Take 1 capsule (100 mg total) by mouth 3 (three) times daily. For neuropathy  90  capsule  1  . hydrochlorothiazide (HYDRODIURIL) 25 MG tablet Take 1 tablet (25 mg total) by mouth daily. For blood pressure.  30 tablet  1  . HYDROcodone-acetaminophen (NORCO/VICODIN) 5-325 MG per tablet Take 1-2 tablets by mouth every 6 (six) hours as needed.      . hydrOXYzine (ATARAX/VISTARIL) 25 MG tablet Take 1 tablet (25 mg total) by mouth 3 (three) times daily as needed for itching.  90 tablet  1  . IRON PO Take 45 mg by mouth.      . losartan (COZAAR) 100 MG tablet Take 1 tablet (100 mg total) by mouth daily. For blood pressure  30 tablet  1  . Multiple Vitamins-Minerals (MULTIVITAMINS THER. W/MINERALS) TABS Take 1 tablet by mouth daily.      . Probiotic Product (PROBIOTIC DAILY PO) Take by mouth.      . prochlorperazine (COMPAZINE) 10 MG tablet Take 10 mg by mouth every 6 (six) hours as needed.      . traMADol (ULTRAM) 50 MG tablet Take 1-2 tablets (50-100 mg total) by mouth every 4 (four) hours as  needed. For moderate to severe pain.  100 tablet  0   Labs:  Lab Results  Component Value Date   WBC 7.4 03/29/2012   HGB 10.7* 03/29/2012   HCT 31.9* 03/29/2012   MCV 83.9 03/29/2012   PLT 254 03/29/2012   Lab Results  Component Value Date   CREATININE 1.2* 03/29/2012   BUN 18.0 03/29/2012   NA 134* 03/29/2012   K 3.7 03/29/2012   CL 98 03/29/2012   CO2 23 03/29/2012   Lab Results  Component Value Date   ALT <6 Repeated and Verified 03/29/2012   AST 19 03/29/2012   PHOS 4.6 03/16/2012   BILITOT 0.90 03/29/2012    Physical Examination:  weight is 192 lb 3.2 oz (87.181 kg). Her temperature is 97.9 F (36.6 C). Her blood pressure is 145/97 and her pulse is 55.    Wt Readings from Last 3 Encounters:  04/17/12 192 lb 3.2 oz (87.181 kg)  04/11/12 193 lb 8 oz (87.771 kg)  04/02/12 190 lb 9.6 oz (86.456 kg)     Lungs - Normal respiratory effort, chest expands symmetrically. Lungs are clear to auscultation, no crackles or wheezes.  Heart has regular rhythm and rate  Abdomen  is soft and non tender with normal bowel sounds  Assessment:  Patient tolerating treatments well  Plan: Continue treatment per original radiation prescription

## 2012-04-18 ENCOUNTER — Ambulatory Visit
Admission: RE | Admit: 2012-04-18 | Discharge: 2012-04-18 | Disposition: A | Discharge status: Home / Self Care | Payer: BC Managed Care – PPO | Source: Ambulatory Visit | Attending: Radiation Oncology | Admitting: Radiation Oncology

## 2012-04-18 ENCOUNTER — Other Ambulatory Visit: Payer: Self-pay

## 2012-04-18 ENCOUNTER — Ambulatory Visit (HOSPITAL_BASED_OUTPATIENT_CLINIC_OR_DEPARTMENT_OTHER): Payer: BC Managed Care – PPO

## 2012-04-18 ENCOUNTER — Ambulatory Visit: Payer: BC Managed Care – PPO

## 2012-04-18 VITALS — BP 163/80 | HR 49 | Temp 97.7°F

## 2012-04-18 DIAGNOSIS — C50419 Malignant neoplasm of upper-outer quadrant of unspecified female breast: Secondary | ICD-10-CM

## 2012-04-18 DIAGNOSIS — C50919 Malignant neoplasm of unspecified site of unspecified female breast: Secondary | ICD-10-CM

## 2012-04-18 DIAGNOSIS — Z452 Encounter for adjustment and management of vascular access device: Secondary | ICD-10-CM

## 2012-04-18 MED ORDER — ALRA NON-METALLIC DEODORANT (RAD-ONC): 1.0000 "application " | Freq: Once | TOPICAL | Status: DC

## 2012-04-18 MED ORDER — RADIAPLEXRX EX GEL: Freq: Once | CUTANEOUS | Status: DC

## 2012-04-18 MED ORDER — HEPARIN SOD (PORK) LOCK FLUSH 100 UNIT/ML IV SOLN
500.0000 [IU] | Freq: Once | INTRAVENOUS | Status: AC
  Administered 2012-04-18: 500 [IU] via INTRAVENOUS
  Filled 2012-04-18: qty 5

## 2012-04-18 MED ORDER — SODIUM CHLORIDE 0.9 % IJ SOLN
10.0000 mL | INTRAMUSCULAR | Status: DC | PRN
  Administered 2012-04-18: 10 mL via INTRAVENOUS
  Filled 2012-04-18: qty 10

## 2012-04-18 MED ORDER — ALRA NON-METALLIC DEODORANT (RAD-ONC)
1.0000 "application " | Freq: Once | TOPICAL | Status: AC
  Administered 2012-04-18: 1 via TOPICAL

## 2012-04-18 MED ORDER — RADIAPLEXRX EX GEL
Freq: Once | CUTANEOUS | Status: AC
  Administered 2012-04-18: 1 via TOPICAL

## 2012-04-18 NOTE — Progress Notes (Signed)
Routine of clinic reviewed with patient and patient education provide.Patient did read Radiation Therapy and You Booklet given on yesterday.see document flow sheet.

## 2012-04-19 ENCOUNTER — Ambulatory Visit
Admission: RE | Admit: 2012-04-19 | Discharge: 2012-04-19 | Disposition: A | Discharge status: Home / Self Care | Payer: BC Managed Care – PPO | Source: Ambulatory Visit | Attending: Radiation Oncology | Admitting: Radiation Oncology

## 2012-04-19 ENCOUNTER — Ambulatory Visit: Payer: BC Managed Care – PPO

## 2012-04-20 ENCOUNTER — Ambulatory Visit: Payer: BC Managed Care – PPO | Admitting: Oncology

## 2012-04-20 ENCOUNTER — Ambulatory Visit: Payer: BC Managed Care – PPO

## 2012-04-20 ENCOUNTER — Telehealth: Payer: Self-pay | Admitting: *Deleted

## 2012-04-20 ENCOUNTER — Ambulatory Visit (HOSPITAL_BASED_OUTPATIENT_CLINIC_OR_DEPARTMENT_OTHER): Payer: BC Managed Care – PPO

## 2012-04-20 ENCOUNTER — Encounter: Payer: Self-pay | Admitting: Infectious Diseases

## 2012-04-20 ENCOUNTER — Encounter: Payer: Self-pay | Admitting: Oncology

## 2012-04-20 ENCOUNTER — Other Ambulatory Visit (HOSPITAL_BASED_OUTPATIENT_CLINIC_OR_DEPARTMENT_OTHER): Payer: BC Managed Care – PPO | Admitting: Lab

## 2012-04-20 ENCOUNTER — Ambulatory Visit
Admission: RE | Admit: 2012-04-20 | Discharge: 2012-04-20 | Disposition: A | Discharge status: Home / Self Care | Payer: BC Managed Care – PPO | Source: Ambulatory Visit | Attending: Radiation Oncology | Admitting: Radiation Oncology

## 2012-04-20 ENCOUNTER — Telehealth: Payer: Self-pay | Admitting: Oncology

## 2012-04-20 VITALS — BP 127/74 | HR 56 | Temp 97.9°F | Resp 20 | Ht 66.0 in | Wt 190.5 lb

## 2012-04-20 DIAGNOSIS — C50419 Malignant neoplasm of upper-outer quadrant of unspecified female breast: Secondary | ICD-10-CM

## 2012-04-20 DIAGNOSIS — Z170 Estrogen receptor positive status [ER+]: Secondary | ICD-10-CM

## 2012-04-20 DIAGNOSIS — R5383 Other fatigue: Secondary | ICD-10-CM

## 2012-04-20 DIAGNOSIS — R52 Pain, unspecified: Secondary | ICD-10-CM

## 2012-04-20 DIAGNOSIS — C773 Secondary and unspecified malignant neoplasm of axilla and upper limb lymph nodes: Secondary | ICD-10-CM

## 2012-04-20 DIAGNOSIS — R5381 Other malaise: Secondary | ICD-10-CM

## 2012-04-20 DIAGNOSIS — Z5112 Encounter for antineoplastic immunotherapy: Secondary | ICD-10-CM

## 2012-04-20 LAB — COMPREHENSIVE METABOLIC PANEL (CC13)
ALT: 7 U/L (ref 0–55)
AST: 14 U/L (ref 5–34)
Albumin: 3.7 g/dL (ref 3.5–5.0)
Alkaline Phosphatase: 178 U/L — ABNORMAL HIGH (ref 40–150)
BUN: 19 mg/dL (ref 7.0–26.0)
CO2: 27 mEq/L (ref 22–29)
Calcium: 10 mg/dL (ref 8.4–10.4)
Chloride: 101 mEq/L (ref 98–107)
Creatinine: 1.1 mg/dL (ref 0.6–1.1)
Glucose: 105 mg/dl — ABNORMAL HIGH (ref 70–99)
Potassium: 4.3 mEq/L (ref 3.5–5.1)
Sodium: 136 mEq/L (ref 136–145)
Total Bilirubin: 0.77 mg/dL (ref 0.20–1.20)
Total Protein: 7.7 g/dL (ref 6.4–8.3)

## 2012-04-20 LAB — CBC WITH DIFFERENTIAL/PLATELET
BASO%: 0.7 % (ref 0.0–2.0)
Basophils Absolute: 0.1 10*3/uL (ref 0.0–0.1)
EOS%: 3.2 % (ref 0.0–7.0)
Eosinophils Absolute: 0.3 10*3/uL (ref 0.0–0.5)
HCT: 29 % — ABNORMAL LOW (ref 34.8–46.6)
HGB: 9.7 g/dL — ABNORMAL LOW (ref 11.6–15.9)
LYMPH%: 19.6 % (ref 14.0–49.7)
MCH: 27.8 pg (ref 25.1–34.0)
MCHC: 33.4 g/dL (ref 31.5–36.0)
MCV: 83.1 fL (ref 79.5–101.0)
MONO#: 0.5 10*3/uL (ref 0.1–0.9)
MONO%: 5.6 % (ref 0.0–14.0)
NEUT#: 5.8 10*3/uL (ref 1.5–6.5)
NEUT%: 70.9 % (ref 38.4–76.8)
Platelets: 274 10*3/uL (ref 145–400)
RBC: 3.49 10*6/uL — ABNORMAL LOW (ref 3.70–5.45)
RDW: 15.7 % — ABNORMAL HIGH (ref 11.2–14.5)
WBC: 8.2 10*3/uL (ref 3.9–10.3)
lymph#: 1.6 10*3/uL (ref 0.9–3.3)
nRBC: 0 % (ref 0–0)

## 2012-04-20 MED ORDER — ACETAMINOPHEN 325 MG PO TABS
650.0000 mg | ORAL_TABLET | Freq: Once | ORAL | Status: AC
  Administered 2012-04-20: 650 mg via ORAL

## 2012-04-20 MED ORDER — SODIUM CHLORIDE 0.9 % IV SOLN
Freq: Once | INTRAVENOUS | Status: AC
  Administered 2012-04-20: 14:00:00 via INTRAVENOUS

## 2012-04-20 MED ORDER — TRASTUZUMAB CHEMO INJECTION 440 MG
6.0000 mg/kg | Freq: Once | INTRAVENOUS | Status: AC
  Administered 2012-04-20: 525 mg via INTRAVENOUS
  Filled 2012-04-20: qty 25

## 2012-04-20 MED ORDER — SODIUM CHLORIDE 0.9 % IJ SOLN
10.0000 mL | INTRAMUSCULAR | Status: DC | PRN
  Filled 2012-04-20: qty 10

## 2012-04-20 MED ORDER — DIPHENHYDRAMINE HCL 25 MG PO CAPS
50.0000 mg | ORAL_CAPSULE | Freq: Once | ORAL | Status: AC
  Administered 2012-04-20: 50 mg via ORAL

## 2012-04-20 MED ORDER — MORPHINE SULFATE 4 MG/ML IJ SOLN
2.0000 mg | Freq: Once | INTRAMUSCULAR | Status: AC
  Administered 2012-04-20: 2 mg via INTRAVENOUS

## 2012-04-20 NOTE — Patient Instructions (Signed)
[facility] Discharge Instructions for Patients Receiving Chemotherapy  Today you received the following chemotherapy agents; Herceptin  To help prevent nausea and vomiting after your treatment, we encourage you to take your nausea medication as directed.   If you develop nausea and vomiting that is not controlled by your nausea medication, call the clinic. If it is after clinic hours your family physician or the after hours number for the clinic or go to the Emergency Department.   BELOW ARE SYMPTOMS THAT SHOULD BE REPORTED IMMEDIATELY:  *FEVER GREATER THAN 100.5 F  *CHILLS WITH OR WITHOUT FEVER  NAUSEA AND VOMITING THAT IS NOT CONTROLLED WITH YOUR NAUSEA MEDICATION  *UNUSUAL SHORTNESS OF BREATH  *UNUSUAL BRUISING OR BLEEDING  TENDERNESS IN MOUTH AND THROAT WITH OR WITHOUT PRESENCE OF ULCERS  *URINARY PROBLEMS  *BOWEL PROBLEMS  UNUSUAL RASH Items with * indicate a potential emergency and should be followed up as soon as possible.  Feel free to call the clinic you have any questions or concerns. The clinic phone number is [phone].   I have been informed and understand all the instructions given to me. I know to contact the clinic, my physician, or go to the Emergency Department if any problems should occur. I do not have any questions at this time, but understand that I may call the clinic during office hours   should I have any questions or need assistance in obtaining follow up care.    __________________________________________  _____________  __________ Signature of Patient or Authorized Representative            Date                   Time    __________________________________________ Nurse's Signature    

## 2012-04-20 NOTE — Progress Notes (Signed)
OFFICE PROGRESS NOTE  CC  Julie Ross [address] [place] [address] Dr. Cyndia Bent Dr. Richardean Chimera Dr. Mardella Layman Dr. Gustavus Messing ([facility])  DIAGNOSIS: 51 year old female who originally was seen in the multidisciplinary breast clinic for discussion of new diagnosis of left breast cancer. She is now status post left mastectomy with the final pathology revealing an invasive ductal carcinoma that is ER positive PR positive HER-2/neu positive with Ki-67 11%.  PRIOR THERAPY:  #1 patient was originally seen in the multidisciplinary breast clinic on 11/30/2011 for what at that time looked like a ductal carcinoma in situ of the left breast. Due to the extent of disease she was recommended a mastectomy with axillary lymph node sampling.  #2 12/19/2011 patient had a left mastectomy with sentinel lymph node biopsy. Her final pathology revealed 2 foci of invasive ductal carcinoma measuring 0.8 and 0.6 cm. The tumor was low grade ER positive PR positive. Patient's tumor was found to be HER-2/neu positive with HER-2 ratio of 3.27. Ki-67 was 11%. Patient had sentinel lymph node  Biopsies performed one of 4 lymph nodes were positive for metastatic disease. Patient's final pathologic staging is T1 N1 M0. Stage II  #3 patient has received adjuvant chemotherapy consisting of Taxotere carboplatinum and Herceptin for 2 cycles. This was then complicated by development of bacteremia left knee sepsis requiring revision of the knee. Port-A-Cath site infection requiring removal of the Port-A-Cath and placement of a PICC line.  #4 patient will now receive Herceptin every 3 weeks with radiation therapy this is to continue until patient finishes up her antibiotics then we may consider her completing her 2 cycles of Taxotere and carboplatinum combination.  CURRENT THERAPY: Patient will proceed with Herceptin every 3 weeks  INTERVAL HISTORY: Julie Ross 51  y.o. female returns for Followup visit. He is to be tolerating radiation quite well without any significant problems. She is also taking Herceptin every 3 weeks. She does still have 2 more cycles of Taxotere and carboplatinum that need to be administered but we will wait till after she has had completion of her radiation therapy. All of this is explained to the patient. Patient also has a PICC line in and I do think it is time now that we could reinsert a Port-A-Cath. I do think this will make things a lot easier for her in terms of flashes. She has had adequate course of antibiotics and is currently on only oral antibiotics. I do think she should do well. She is denying any fevers chills night sweats headaches shortness of breath chest pains or palpitations no night sweats. Remainder of the 10 point review of systems is negative. MEDICAL HISTORY: Past Medical History  Diagnosis Date  . Hypertension   . Hypothyroidism   . Arthritis   . Neuromuscular disorder     MS TX WITH CYMBALTA   . Multiple sclerosis   . H/O colonoscopy   . H/O bone density study 03/2011  . Wears glasses   . Heart burn   . Depression   . Sleep apnea     MILD SLEEP APNEA, NO MACHINE  6 YRS AGO HPT [facility]   . Breast cancer     lul/ER+PR+    ALLERGIES:   has no known allergies.  MEDICATIONS:  Current Outpatient Prescriptions  Medication Sig Dispense Refill  . acetaminophen (TYLENOL) 325 MG tablet Take 1-2 tablets (325-650 mg total) by mouth every 4 (four) hours as needed.      Marland Kitchen  ALPRAZolam (XANAX) 0.5 MG tablet Take 1 tablet (0.5 mg total) by mouth 3 (three) times daily as needed for anxiety.  15 tablet  0  . atenolol (TENORMIN) 25 MG tablet Take 0.5 tablets (12.5 mg total) by mouth daily. For blood pressure  15 tablet  0  . bacitracin ointment Apply topically 2 (two) times daily.  120 g    . CALCIUM-MAGNESIUM PO Take 1 tablet by mouth daily.      . cephALEXin (KEFLEX) 500 MG capsule Take 1 capsule (500 mg total) by  mouth 4 (four) times daily.  120 capsule  4  . docusate sodium (COLACE) 100 MG capsule Take 100 mg by mouth daily.      Marland Kitchen esomeprazole (NEXIUM) 40 MG capsule Take 40 mg by mouth daily before breakfast.      . gabapentin (NEURONTIN) 100 MG capsule Take 1 capsule (100 mg total) by mouth 3 (three) times daily. For neuropathy  90 capsule  1  . hydrochlorothiazide (HYDRODIURIL) 25 MG tablet Take 1 tablet (25 mg total) by mouth daily. For blood pressure.  30 tablet  1  . HYDROcodone-acetaminophen (NORCO/VICODIN) 5-325 MG per tablet Take 1-2 tablets by mouth every 6 (six) hours as needed.      . hydrOXYzine (ATARAX/VISTARIL) 25 MG tablet Take 1 tablet (25 mg total) by mouth 3 (three) times daily as needed for itching.  90 tablet  1  . IRON PO Take 45 mg by mouth.      . losartan (COZAAR) 100 MG tablet Take 1 tablet (100 mg total) by mouth daily. For blood pressure  30 tablet  1  . Multiple Vitamins-Minerals (MULTIVITAMINS THER. W/MINERALS) TABS Take 1 tablet by mouth daily.      . Probiotic Product (PROBIOTIC DAILY PO) Take by mouth.      . prochlorperazine (COMPAZINE) 10 MG tablet Take 10 mg by mouth every 6 (six) hours as needed.      . traMADol (ULTRAM) 50 MG tablet Take 1-2 tablets (50-100 mg total) by mouth every 4 (four) hours as needed. For moderate to severe pain.  100 tablet  0    SURGICAL HISTORY:  Past Surgical History  Procedure Date  . Tonsillectomy   . Tumor removal     FROM PELVIS AGE 20  . Cesarean section     X2   . No past surgeries     BLADDER SLING  4-5 YRS AGO   . Knee arthroscopy     LT KNEE 04/2011  . Total knee arthroplasty 08/15/2011    Procedure: TOTAL KNEE ARTHROPLASTY; lft Surgeon: Harvie Junior, MD;  Location: [facility] OR;  Service: Orthopedics;  Laterality: Left;  RIGHT KNEE CORTIZONE INJECTION  . Mohs surgery     right face  . Knee arthroscopy 82    rt  . Joint replacement Feb 2013  . Mastectomy w/ sentinel node biopsy 12/19/2011    Procedure: MASTECTOMY WITH  SENTINEL LYMPH NODE BIOPSY;  Surgeon: Currie Paris, MD;  Location: [facility] OR;  Service: General;  Laterality: Left;  left breast and left axilla   . Portacath placement 01/16/2012    Procedure: INSERTION PORT-A-CATH;  Surgeon: Currie Paris, MD;  Location: [facility];  Service: General;  Laterality: Right;  Porta Cath Placement   . I&d knee with poly exchange 03/02/2012    Procedure: IRRIGATION AND DEBRIDEMENT KNEE WITH POLY EXCHANGE;  Surgeon: Harvie Junior, MD;  Location: [facility] OR;  Service: Orthopedics;  Laterality: Left;  I&D of total knee with Possible Poly Exchange   . Tee without cardioversion 03/06/2012    Procedure: TRANSESOPHAGEAL ECHOCARDIOGRAM (TEE);  Surgeon: Wendall Stade, MD;  Location: [facility] ENDOSCOPY;  Service: Cardiovascular;  Laterality: N/A;  Rm. [id number]  . Port-a-cath removal 03/06/2012    Procedure: REMOVAL PORT-A-CATH;  Surgeon: Adolph Pollack, MD;  Location: [facility] OR;  Service: General;  Laterality: N/A;    REVIEW OF SYSTEMS:  Pertinent items are noted in HPI.   PHYSICAL EXAMINATION: General appearance: alert, cooperative and appears stated age Neck: no adenopathy, no carotid bruit, no JVD, supple, symmetrical, trachea midline and thyroid not enlarged, symmetric, no tenderness/mass/nodules Lymph nodes: Cervical, supraclavicular, and axillary nodes normal. Resp: clear to auscultation bilaterally and normal percussion bilaterally Back: symmetric, no curvature. ROM normal. No CVA tenderness. Cardio: regular rate and rhythm, S1, S2 normal, no murmur, click, rub or gallop GI: soft, non-tender; bowel sounds normal; no masses,  no organomegaly Extremities: extremities normal, atraumatic, no cyanosis or edema Neurologic: Grossly normal  ECOG PERFORMANCE STATUS: 1 - Symptomatic but completely ambulatory  Blood pressure 127/74, pulse 56, temperature 97.9 F (36.6 C), temperature source Oral, resp. rate 20, height 5\' 6"  (1.676 m), weight 190 lb 8 oz (86.41  kg).  LABORATORY DATA: Lab Results  Component Value Date   WBC 8.2 04/20/2012   HGB 9.7* 04/20/2012   HCT 29.0* 04/20/2012   MCV 83.1 04/20/2012   PLT 274 04/20/2012      Chemistry      Component Value Date/Time   NA 134* 03/29/2012 0913   NA 140 03/16/2012 0500   K 3.7 03/29/2012 0913   K 4.0 03/16/2012 0500   CL 98 03/29/2012 0913   CL 98 03/16/2012 0500   CO2 23 03/29/2012 0913   CO2 33* 03/16/2012 0500   BUN 18.0 03/29/2012 0913   BUN 19 03/16/2012 0500   CREATININE 1.2* 03/29/2012 0913   CREATININE 1.87* 03/16/2012 0500      Component Value Date/Time   CALCIUM 9.4 03/29/2012 0913   CALCIUM 9.1 03/16/2012 0500   ALKPHOS 196* 03/29/2012 0913   ALKPHOS 494* 03/09/2012 0535   AST 19 03/29/2012 0913   AST 18 03/09/2012 0535   ALT <6 Repeated and Verified 03/29/2012 0913   ALT 11 03/09/2012 0535   BILITOT 0.90 03/29/2012 0913   BILITOT 0.2* 03/09/2012 0535     DOB: 01-18-1961 Age: 80 Gender: F Client Name [place] Collected Date: 12/19/2011 Received Date: 12/19/2011 Physician: Cyndia Bent Chart #: MRN # : [id number] Physician cc: Richardean Chimera, MD Sherian Rein, MD Race:W Visit #: [id number] Kaylyn Lim, RN REPORT OF SURGICAL PATHOLOGY ADDITIONAL INFORMATION: 5. PROGNOSTIC INDICATORS - ACIS Results IMMUNOHISTOCHEMICAL AND MORPHOMETRIC ANALYSIS BY THE AUTOMATED CELLULAR IMAGING SYSTEM (ACIS) Estrogen Receptor (Negative, <1%): 100%, STRONG STAINING INTENSITY Progesterone Receptor (Negative, <1%): 100%, STRONG STAINING INTENSITY Proliferation Marker Ki67 by M IB-1 (Low<20%): 11% All controls stained appropriately Pecola Leisure MD Pathologist, Electronic Signature ( Signed 12/29/2011) 5. CHROMOGENIC IN-SITU HYBRIDIZATION Interpretation: HER2/NEU BY CISH - SHOWS AMPLIFICATION BY CISH ANALYSIS. THE RATIO OF HER2: CEP 17 SIGNALS WAS 3.27 Reference range: Ratio: HER2:CEP17 < 1.8 gene amplification not observed Ratio: HER2:CEP 17 1.8-2.2 - equivocal  result Ratio: HER2:CEP17 > 2.2 - gene amplification observed Pecola Leisure MD Pathologist, Electronic Signature ( Signed 12/27/2011) 1 of 4 FINAL for Berwick, Ethlyn M ([id number]) FINAL DIAGNOSIS Diagnosis 1. Lymph node, sentinel, biopsy, Left axillary - ONE BENIGN LYMPH NODE WITH NO TUMOR SEEN (0/1). 2.  Lymph node, sentinel, biopsy, Left axilla - ONE LYMPH NODE POSITIVE FOR METASTATIC DUCTAL CARCINOMA (1/1). - SEE COMMENT. 3. Lymph node, sentinel, biopsy, Left axilla - ONE BENIGN LYMPH NODE WITH NO TUMOR SEEN (0/1). 4. Lymph node, sentinel, biopsy, Left axilla - ONE BENIGN LYMPH NODE WITH NO TUMOR SEEN (0/1). 5. Breast, simple mastectomy, Left - INVASIVE GRADE I DUCTAL CARCINOMA, TWO FOCI MEASURING 0.8 CM AND 0.6 CM ARISING IN A BACKGROUND OF EXTENSIVE INTERMEDIATE GRADE DUCTAL CARCINOMA IN SITU WITH NECROSIS. - MARGINS ARE NEGATIVE. - SEE ONCOLOGY TEMPLATE. Microscopic Comment 2. On re-review of the frozen section slides, no metastatic carcinoma is identified. The carcinoma present is on permanent sections only. 5. BREAST, INVASIVE TUMOR, WITH LYMPH NODE SAMPLING Specimen, including laterality: Left breast with sentinel lymph node. Procedure: Left simple mastectomy with sentinel lymph node biopsy. Grade: I (Both foci of tumors show similar morphology). Tubule formation: 2. Nuclear pleomorphism: 2. Mitotic: 1. Tumor size (glass slide measurement): Two foci measuring 0.8 cm and 0.6 cm. Margins: Invasive, distance to closest margin: At least 1.5 cm. In-situ, distance to closest margin: At least 1.5 cm. Lymphovascular invasion: No definite lymph/vascular invasion is not identified however a sentinel lymph node is positive (see below). Ductal carcinoma in situ: Yes. Grade: Intermediate grade. Extensive intraductal component: Yes. Lobular neoplasia: Not identified. Tumor focality: Two foci. Treatment effect: Not applicable. Extent of tumor: Tumor confined to breast  parenchyma. Lymph nodes: # examined: 4. Lymph nodes with metastasis: 1. Macrometastasis: (> 2.0 mm): 1. Extracapsular extension: Not identified. Breast prognostic profile: A breast prognostic profile will be performed (see below comment). Additional breast findings: Fibrocystic changes with usual ductal hyperplasia. TNM: mpT1b, pN1a, MX. Comment: The palpable nodular area seen grossly is comprised predominantly of intermediate grade ductal carcinoma in situ with only two foci of invasive ductal carcinoma. Both invasive foci are morphologically similar. As such a breast prognostic profile will only be performed on the larger tumor. A breast prognostic profile can be performed upon the smaller tumor per request. (RH:kh 12-21-11) 2 of 4 FINAL for Prospero, Jissel M ([id number]) Microscopic Comment(continued) Zandra Abts MD Pathologist, Electronic Signature (Case signed 12/21/2011) Intraoperative Diagnosis 1. RECEIVED FRESH IS TISSUE THAT CONTAINS A LYMPH NODE WHICH IS SAMPLED FOR RIOC. LEFT AXILLARY SENTINEL LYMPH NODE, FROZEN SECTION AND TOUCH PREPARATION- ONE LYMPH NODE WITH NO METASTATIC CARCINOMA IDENTIFIED. (RAH) 2. RECEIVED FRESH IS TISSUE THAT CONTAINS A LYMPH NODE WHICH IS SAMPLED FOR RIOC. LEFT AXILLARY SENTINEL LYMPH NODE, FROZEN SECTION AND TOUCH PREPARATION- ONE LYMPH NODE WITH NO METASTATIC CARCINOMA IDENTIFIED. (RAH) 3. RECEIVED FRESH IS TISSUE THAT CONTAINS A LYMPH NODE WHICH IS SAMPLED FOR RIOC. LEFT AXILLARY SENTINEL LYMPH NODE, FROZEN SECTION AND TOUCH PREPARATION- ONE LYMPH NODE WITH NO METASTATIC CARCINOMA IDENTIFIED. (RAH) 4. RECEIVED FRESH IS TISSUE THAT CONTAINS A LYMPH NODE WHICH IS SAMPLED FOR RIOC. LEFT AXILLARY SENTINEL LYMPH NODE, FROZEN SECTION AND TOUCH PREPARATION- ONE LYMPH NODE WITH NO METASTATIC CARCINOMA IDENTIFIED. (RAH) Specimen Gross and Clinical Information Specimen(s) Obtained: 1. Lymph node, sentinel, biopsy, Left axillary 2. Lymph node, sentinel,  biopsy, Left axilla 3. Lymph node, sentinel, biopsy, Left axilla 4. Lymph node, sentinel, biopsy, Left axilla 5. Breast, simple mastectomy, Left Specimen Clinical Information 1. Left breast cancer (tl) Gross 1. Rapid Intraoperative Consult performed (Yes or No): Yes. Specimen: Left axillary sentinel node. Number and size: One node, 2.2 cm Cut Surface(s): Soft, fatty, focally blue. Block Summary: The node is sectioned, touch preparations are made on one slide, and the tissue is  submitted in one block labeled SLN for frozen section. 2. Rapid Intraoperative Consult performed (Yes or No): Yes. Specimen: Left axillary sentinel node. Number and size: One node, 2.1 cm. Cut Surface(s): Soft, fatty, focally blue. Block Summary: The node is bisected, touch preparations are made on one slide, and is submitted in one block labeled SLN for frozen section. 3. Rapid Intraoperative Consult performed (Yes or No): Yes. Specimen: Left axillary sentinel node. Number and size: One node, 0.7 cm Cut Surface(s): Soft, fatty, focally blue. Block Summary: The node is bisected, touch preparations are made on one slide, and is submitted in one block labeled SLN for frozen section. 4. Rapid Intraoperative Consult performed (Yes or No): Yes. Specimen: Left axillary sentinel node. Number and size: One node, 1.5 cm. 3 of 4 FINAL for Bidinger, Daphnee M ([id number]) Gross(continued) Cut Surface(s): Soft, fatty, non blue. Block Summary: The node is bisected, touch preparations are made on one slide and is submitted in one block labeled SLN for frozen section. (SW:gt, 12/20/11) 5. Specimen: Left simple mastectomy, received fresh. The specimen was placed in formalin at 2:50 p.m. on 12/19/11. Specimen integrity (intact/disrupted): Intact. Weight: 1185 grams. Size: 28 x 24.5 x 7 cm. Skin: There is an attached 16 x 7.5 cm ellipse of skin with a central, unremarkable nipple. There is a suture attached at the superior  edge. Tumor/cavity: In the mid central to mid lateral breast there is an ill defined area of palpable nodular fibrous tissue without a discrete mass identified. This area measures approximately 7 x 6 x 3 cm and extends to within 1.5 cm of the deep margin. Within this area there is a 1.5 cm cavitary defect, consistent with a previous biopsy site. There is a silver metallic biopsy clip present at the defect. Uninvolved parenchyma: The remainder of the breast tissue consists of fat and dense white fibrous tissue. Prognostic indicators: Obtain from paraffin blocks if needed. Lymph nodes: There are no lymph nodes identified. Block summary: 13 blocks submitted. A = deep margin at nodular area. B - I = sections of nodular area, sequentially labeled from lateral through medial, margins not included. J = lower medial. K = upper medial. L = lower lateral. M = upper lateral. (GP:eps 12/20/11)  RADIOGRAPHIC STUDIES:  Nm Sentinel Node Inj-no Rpt (breast)  12/19/2011  CLINICAL DATA: breast cancer   Sulfur colloid was injected intradermally by the nuclear medicine  technologist for breast cancer sentinel node localization.      ASSESSMENT: 51 year old female with  #1 stage II invasive ductal carcinoma of the left breast with ductal carcinoma in situ. Patient is status post mastectomy that showed 2 foci of invasive cancer measuring 0.8 0.6 cm. The tumor is ER positive PR positive HER-2/neu amplified at 3.27. Patient is now seen in medical oncology for discussion of adjuvant treatment. As mentioned above her case was discussed at the multidisciplinary breast conference. Patient is noted to have one of 4 lymph nodes positive for metastatic disease. This was only axillary sentinel node biopsy. A discussion of whether patient under should undergo a full axillary lymph node dissection did take place but at the end of our extensive discussion it was recommended the patient will proceed with radiation and DOS she  would feel the criteria of seed 11. Therefore is excellent lymph node dissection is not being done. Instead the patient will proceed with adjuvant chemotherapy and Herceptin. Her chemotherapy will consist of Taxotere carboplatinum given every 21 days. For the duration of her chemotherapy patient  will get Herceptin on a weekly basis. Once she completes the chemotherapy she then will get Herceptin every 3 weeks. After completion of chemotherapy patient will proceed with radiation therapy with concurrent Herceptin. After completion of radiation she will start Herceptin and antiestrogen therapy consisting of either tamoxifen or an aromatase inhibitor.  #2 is continuing radiation therapy with Herceptin being given every 3 weeks.  #3 patient does have a PICC line in however I do think it is appropriate now to have a Port-A-Cath placed. We will remove her PICC line after today's Herceptin and I will refer her for a Port-A-Cath placement.  PLAN:  #1 proceed with Herceptin every 3 weeks.  #2 we will plan on [place] doing a Port-A-Cath prior to her next Herceptin.  #3 she will be seen back in 3 weeks' time in followup.  All questions were answered. The patient knows to call the clinic with any problems, questions or concerns. We can certainly see the patient much sooner if necessary.  I spent 25 minutes counseling the patient face to face. The total time spent in the appointment was 30 minutes.    Drue Second, MD Medical/Oncology [facility] [phone] (beeper) [phone] (Office)  04/20/2012, 1:53 PM

## 2012-04-20 NOTE — Progress Notes (Signed)
PICC line removed per order.  Patient tolerated well.  No signs of bleeding. Patient instructed to keep pressure dressing on for 24 hours.

## 2012-04-20 NOTE — Telephone Encounter (Signed)
Pt has her nov-feb 2014 appt calendar

## 2012-04-20 NOTE — Telephone Encounter (Signed)
Per staff message and POF I have scheduled appts. JWM  

## 2012-04-23 ENCOUNTER — Other Ambulatory Visit: Payer: Self-pay | Admitting: Oncology

## 2012-04-23 ENCOUNTER — Ambulatory Visit: Payer: BC Managed Care – PPO

## 2012-04-23 ENCOUNTER — Ambulatory Visit
Admission: RE | Admit: 2012-04-23 | Discharge: 2012-04-23 | Disposition: A | Discharge status: Home / Self Care | Payer: BC Managed Care – PPO | Source: Ambulatory Visit | Attending: Radiation Oncology | Admitting: Radiation Oncology

## 2012-04-23 DIAGNOSIS — C50419 Malignant neoplasm of upper-outer quadrant of unspecified female breast: Secondary | ICD-10-CM

## 2012-04-24 ENCOUNTER — Ambulatory Visit
Admission: RE | Admit: 2012-04-24 | Discharge: 2012-04-24 | Disposition: A | Discharge status: Home / Self Care | Payer: BC Managed Care – PPO | Source: Ambulatory Visit | Attending: Radiation Oncology | Admitting: Radiation Oncology

## 2012-04-24 ENCOUNTER — Ambulatory Visit: Payer: BC Managed Care – PPO

## 2012-04-24 VITALS — BP 139/82 | HR 53 | Temp 97.3°F | Wt 191.7 lb

## 2012-04-24 DIAGNOSIS — C50419 Malignant neoplasm of upper-outer quadrant of unspecified female breast: Secondary | ICD-10-CM

## 2012-04-24 NOTE — Progress Notes (Signed)
[facility]    Radiation Oncology [address] [place]     Maryln Gottron, M.D. [place], [place] [address]               Billie Lade, M.D., Ph.D. Phone: [phone]      Molli Hazard A. Kathrynn Running, M.D. Fax: [phone]      Radene Gunning, M.D., Ph.D.         Lurline Hare, M.D.         Grayland Jack, M.D Weekly Treatment Management Note  Name: Julie Ross     MRN: [id number]        CSN: [id number] Date: 04/24/2012      DOB: 08-Nov-1960  CC: [name]    Status: Outpatient  Diagnosis: The encounter diagnosis was Cancer of upper-outer quadrant of female breast.  Current Dose: 1080 cGy  Current Fraction: 6  Planned Dose: 5940 cGy  Narrative: [name] was seen today for weekly treatment management. The chart was checked and port films  were reviewed. She is tolerating the treatments well at this time without any significant itching or discomfort in the chest area. She does have a fatigue related to her overall situation.  Review of patient's allergies indicates no known allergies.  Current Outpatient Prescriptions  Medication Sig Dispense Refill  . acetaminophen (TYLENOL) 325 MG tablet Take 1-2 tablets (325-650 mg total) by mouth every 4 (four) hours as needed.      . ALPRAZolam (XANAX) 0.5 MG tablet Take 1 tablet (0.5 mg total) by mouth 3 (three) times daily as needed for anxiety.  15 tablet  0  . atenolol (TENORMIN) 25 MG tablet Take 0.5 tablets (12.5 mg total) by mouth daily. For blood pressure  15 tablet  0  . bacitracin ointment Apply topically 2 (two) times daily.  120 g    . CALCIUM-MAGNESIUM PO Take 1 tablet by mouth daily.      . cephALEXin (KEFLEX) 500 MG capsule Take 1 capsule (500 mg total) by mouth 4 (four) times daily.  120 capsule  4  . docusate sodium (COLACE) 100 MG capsule Take 100 mg by mouth daily.      Marland Kitchen esomeprazole (NEXIUM) 40 MG capsule Take 40 mg by mouth daily before breakfast.      . gabapentin (NEURONTIN)  100 MG capsule Take 1 capsule (100 mg total) by mouth 3 (three) times daily. For neuropathy  90 capsule  1  . hydrochlorothiazide (HYDRODIURIL) 25 MG tablet Take 1 tablet (25 mg total) by mouth daily. For blood pressure.  30 tablet  1  . HYDROcodone-acetaminophen (NORCO/VICODIN) 5-325 MG per tablet Take 1-2 tablets by mouth every 6 (six) hours as needed.      . hydrOXYzine (ATARAX/VISTARIL) 25 MG tablet Take 1 tablet (25 mg total) by mouth 3 (three) times daily as needed for itching.  90 tablet  1  . IRON PO Take 45 mg by mouth.      . losartan (COZAAR) 100 MG tablet Take 1 tablet (100 mg total) by mouth daily. For blood pressure  30 tablet  1  . Multiple Vitamins-Minerals (MULTIVITAMINS THER. W/MINERALS) TABS Take 1 tablet by mouth daily.      . Probiotic Product (PROBIOTIC DAILY PO) Take by mouth.      . prochlorperazine (COMPAZINE) 10 MG tablet TAKE 1 TABLET BY MOUTH EVERY 6 HOURS AS NEEDED FOR NAUSEA AND VOMITING  30 tablet  2  . traMADol (ULTRAM) 50 MG tablet Take 1-2 tablets (50-100 mg total) by mouth every 4 (four) hours as needed. For moderate to severe pain.  100 tablet  0  . HYDROcodone-acetaminophen (NORCO) 10-325 MG per tablet        Labs:  Lab Results  Component Value Date   WBC 8.2 04/20/2012   HGB 9.7* 04/20/2012   HCT 29.0* 04/20/2012   MCV 83.1 04/20/2012   PLT 274 04/20/2012   Lab Results  Component Value Date   CREATININE 1.1 04/20/2012   BUN 19.0 04/20/2012   NA 136 04/20/2012   K 4.3 04/20/2012   CL 101 04/20/2012   CO2 27 04/20/2012   Lab Results  Component Value Date   ALT 7 04/20/2012   AST 14 04/20/2012   PHOS 4.6 03/16/2012   BILITOT 0.77 04/20/2012    Physical Examination:  weight is 191 lb 11.2 oz (86.955 kg). Her temperature is 97.3 F (36.3 C). Her blood pressure is 139/82 and her pulse is 53.    Wt Readings from Last 3 Encounters:  04/24/12 191 lb 11.2 oz (86.955 kg)  04/20/12 190 lb 8 oz (86.41 kg)  04/17/12 192 lb 3.2 oz (87.181 kg)     Lungs - Normal  respiratory effort, chest expands symmetrically. Lungs are clear to auscultation, no crackles or wheezes.  Heart has regular rhythm and rate  Abdomen is soft and non tender with normal bowel sounds The left chest wall area shows some mild erythema.  Assessment:  Patient tolerating treatments well  Plan: Continue treatment per original radiation prescription

## 2012-04-24 NOTE — Progress Notes (Signed)
Patient here for weekly under treat assessment of left chest wall radiation.Skin pink.Generalized fatigue.Pain unrelated to treatment ongoing chronic pain of left knknee .

## 2012-04-25 ENCOUNTER — Ambulatory Visit: Payer: BC Managed Care – PPO

## 2012-04-25 ENCOUNTER — Telehealth (INDEPENDENT_AMBULATORY_CARE_PROVIDER_SITE_OTHER): Payer: Self-pay | Admitting: General Surgery

## 2012-04-25 ENCOUNTER — Ambulatory Visit
Admission: RE | Admit: 2012-04-25 | Discharge: 2012-04-25 | Disposition: A | Discharge status: Home / Self Care | Payer: BC Managed Care – PPO | Source: Ambulatory Visit | Attending: Radiation Oncology | Admitting: Radiation Oncology

## 2012-04-25 NOTE — Telephone Encounter (Signed)
Patient made aware we need clearance to re-insert her PAC. Letter sent to Dr Orvan Falconer.

## 2012-04-25 NOTE — Telephone Encounter (Signed)
Message copied by Liliana Cline on Wed Apr 25, 2012  2:36 PM ------      Message from: Currie Paris      Created: Wed Apr 25, 2012  1:07 PM      Regarding: RE: Tomasita Crumble: [phone]       I can schedule her without seeing her - she needs to be completely over the knee infection and get an OK from her med physicians that it is safe toput another port in from the infection standpoint      ----- Message -----         From: Liliana Cline, CMA         Sent: 04/24/2012  11:55 AM           To: Currie Paris, MD      Subject: Annell GreeningJamey Ripa                                               Looks like her PAC was removed because of infection in knee prosthesis. Please advise.             ----- Message -----         From: Rise Paganini         Sent: 04/24/2012  11:41 AM           To: Liliana Cline, CMA      Subject: Streck                                                   Patient would like to discuss port placement with you. She would like to know if she will need an appointment or not. She is scheduled on 05/25/12 for a regular breast check from the recall list. Thank you.

## 2012-04-26 ENCOUNTER — Ambulatory Visit: Payer: BC Managed Care – PPO

## 2012-04-26 ENCOUNTER — Ambulatory Visit
Admission: RE | Admit: 2012-04-26 | Discharge: 2012-04-26 | Disposition: A | Discharge status: Home / Self Care | Payer: BC Managed Care – PPO | Source: Ambulatory Visit | Attending: Radiation Oncology | Admitting: Radiation Oncology

## 2012-04-27 ENCOUNTER — Ambulatory Visit: Payer: BC Managed Care – PPO

## 2012-04-27 ENCOUNTER — Ambulatory Visit
Admission: RE | Admit: 2012-04-27 | Discharge: 2012-04-27 | Disposition: A | Discharge status: Home / Self Care | Payer: BC Managed Care – PPO | Source: Ambulatory Visit | Attending: Radiation Oncology | Admitting: Radiation Oncology

## 2012-04-27 ENCOUNTER — Other Ambulatory Visit (INDEPENDENT_AMBULATORY_CARE_PROVIDER_SITE_OTHER): Payer: Self-pay | Admitting: Surgery

## 2012-04-27 NOTE — Progress Notes (Signed)
To come in for bmet-she had mastectomy 7/13-PAC here 7/13-got septic from infection related to lt tkr -has been in hospital 9/13-better now They had to take her PAC out-now needs a new one in,

## 2012-04-30 ENCOUNTER — Ambulatory Visit
Admission: RE | Admit: 2012-04-30 | Discharge: 2012-04-30 | Disposition: A | Discharge status: Home / Self Care | Payer: BC Managed Care – PPO | Source: Ambulatory Visit | Attending: Radiation Oncology | Admitting: Radiation Oncology

## 2012-04-30 ENCOUNTER — Ambulatory Visit: Payer: BC Managed Care – PPO

## 2012-04-30 ENCOUNTER — Encounter (HOSPITAL_BASED_OUTPATIENT_CLINIC_OR_DEPARTMENT_OTHER)
Admission: RE | Admit: 2012-04-30 | Discharge: 2012-04-30 | Disposition: A | Discharge status: Home / Self Care | Payer: BC Managed Care – PPO | Source: Ambulatory Visit | Attending: Surgery | Admitting: Surgery

## 2012-04-30 LAB — BASIC METABOLIC PANEL
BUN: 20 mg/dL (ref 6–23)
CO2: 26 mEq/L (ref 19–32)
Calcium: 9.8 mg/dL (ref 8.4–10.5)
Chloride: 96 mEq/L (ref 96–112)
Creatinine, Ser: 1.1 mg/dL (ref 0.50–1.10)
GFR calc Af Amer: 65 mL/min — ABNORMAL LOW (ref >90)
GFR calc non Af Amer: 56 mL/min — ABNORMAL LOW (ref >90)
Glucose, Bld: 141 mg/dL — ABNORMAL HIGH (ref 70–99)
Potassium: 4 mEq/L (ref 3.5–5.1)
Sodium: 135 mEq/L (ref 135–145)

## 2012-05-01 ENCOUNTER — Ambulatory Visit: Payer: BC Managed Care – PPO

## 2012-05-01 ENCOUNTER — Ambulatory Visit (HOSPITAL_COMMUNITY): Payer: BC Managed Care – PPO

## 2012-05-01 ENCOUNTER — Ambulatory Visit (HOSPITAL_BASED_OUTPATIENT_CLINIC_OR_DEPARTMENT_OTHER): Payer: BC Managed Care – PPO | Admitting: Anesthesiology

## 2012-05-01 ENCOUNTER — Other Ambulatory Visit: Payer: Self-pay | Admitting: Oncology

## 2012-05-01 ENCOUNTER — Encounter (HOSPITAL_BASED_OUTPATIENT_CLINIC_OR_DEPARTMENT_OTHER): Payer: Self-pay | Admitting: Anesthesiology

## 2012-05-01 ENCOUNTER — Ambulatory Visit (HOSPITAL_BASED_OUTPATIENT_CLINIC_OR_DEPARTMENT_OTHER)
Admission: RE | Admit: 2012-05-01 | Discharge: 2012-05-01 | Disposition: A | Discharge status: Home / Self Care | Payer: BC Managed Care – PPO | Source: Ambulatory Visit | Attending: Surgery | Admitting: Surgery

## 2012-05-01 ENCOUNTER — Encounter (HOSPITAL_BASED_OUTPATIENT_CLINIC_OR_DEPARTMENT_OTHER)
Admission: RE | Disposition: A | Discharge status: Home / Self Care | Payer: Self-pay | Source: Ambulatory Visit | Attending: Surgery

## 2012-05-01 ENCOUNTER — Encounter (HOSPITAL_BASED_OUTPATIENT_CLINIC_OR_DEPARTMENT_OTHER): Payer: Self-pay

## 2012-05-01 DIAGNOSIS — E039 Hypothyroidism, unspecified: Secondary | ICD-10-CM | POA: Insufficient documentation

## 2012-05-01 DIAGNOSIS — Z01812 Encounter for preprocedural laboratory examination: Secondary | ICD-10-CM | POA: Insufficient documentation

## 2012-05-01 DIAGNOSIS — I10 Essential (primary) hypertension: Secondary | ICD-10-CM | POA: Insufficient documentation

## 2012-05-01 DIAGNOSIS — F3289 Other specified depressive episodes: Secondary | ICD-10-CM | POA: Insufficient documentation

## 2012-05-01 DIAGNOSIS — Z9010 Acquired absence of unspecified breast and nipple: Secondary | ICD-10-CM | POA: Insufficient documentation

## 2012-05-01 DIAGNOSIS — C50919 Malignant neoplasm of unspecified site of unspecified female breast: Secondary | ICD-10-CM

## 2012-05-01 DIAGNOSIS — IMO0002 Reserved for concepts with insufficient information to code with codable children: Secondary | ICD-10-CM | POA: Insufficient documentation

## 2012-05-01 DIAGNOSIS — G4730 Sleep apnea, unspecified: Secondary | ICD-10-CM | POA: Insufficient documentation

## 2012-05-01 DIAGNOSIS — F329 Major depressive disorder, single episode, unspecified: Secondary | ICD-10-CM | POA: Insufficient documentation

## 2012-05-01 HISTORY — PX: PORTACATH PLACEMENT: SHX2246

## 2012-05-01 SURGERY — INSERTION, TUNNELED CENTRAL VENOUS DEVICE, WITH PORT
Anesthesia: General | Site: Neck | Laterality: Right | Wound class: Clean

## 2012-05-01 MED ORDER — MIDAZOLAM HCL 5 MG/5ML IJ SOLN
INTRAMUSCULAR | Status: DC | PRN
  Administered 2012-05-01: 2 mg via INTRAVENOUS

## 2012-05-01 MED ORDER — FENTANYL CITRATE 0.05 MG/ML IJ SOLN
INTRAMUSCULAR | Status: DC | PRN
  Administered 2012-05-01: 100 ug via INTRAVENOUS

## 2012-05-01 MED ORDER — BUPIVACAINE HCL (PF) 0.25 % IJ SOLN
INTRAMUSCULAR | Status: DC | PRN
  Administered 2012-05-01: 10 mL

## 2012-05-01 MED ORDER — IOHEXOL 300 MG/ML  SOLN
INTRAMUSCULAR | Status: DC | PRN
  Administered 2012-05-01: 10 mL via INTRAVENOUS

## 2012-05-01 MED ORDER — LIDOCAINE HCL (CARDIAC) 20 MG/ML IV SOLN
INTRAVENOUS | Status: DC | PRN
  Administered 2012-05-01: 60 mg via INTRAVENOUS

## 2012-05-01 MED ORDER — HEPARIN (PORCINE) IN NACL 2-0.9 UNIT/ML-% IJ SOLN
INTRAMUSCULAR | Status: DC | PRN
  Administered 2012-05-01: 500 mL via INTRAVENOUS

## 2012-05-01 MED ORDER — LACTATED RINGERS IV SOLN
INTRAVENOUS | Status: DC
  Administered 2012-05-01 (×2): via INTRAVENOUS

## 2012-05-01 MED ORDER — CHLORHEXIDINE GLUCONATE 4 % EX LIQD: 1.0000 "application " | Freq: Once | CUTANEOUS | Status: DC

## 2012-05-01 MED ORDER — CEFAZOLIN SODIUM-DEXTROSE 2-3 GM-% IV SOLR
INTRAVENOUS | Status: DC | PRN
  Administered 2012-05-01 – 2013-04-19 (×3): 2 g via INTRAVENOUS

## 2012-05-01 MED ORDER — HEPARIN SOD (PORK) LOCK FLUSH 100 UNIT/ML IV SOLN
INTRAVENOUS | Status: DC | PRN
  Administered 2012-05-01: 500 [IU] via INTRAVENOUS

## 2012-05-01 MED ORDER — PROPOFOL 10 MG/ML IV BOLUS
INTRAVENOUS | Status: DC | PRN
  Administered 2012-05-01: 160 mg via INTRAVENOUS

## 2012-05-01 MED ORDER — DEXAMETHASONE SODIUM PHOSPHATE 4 MG/ML IJ SOLN
INTRAMUSCULAR | Status: DC | PRN
  Administered 2012-05-01: 8 mg via INTRAVENOUS

## 2012-05-01 SURGICAL SUPPLY — 56 items
ADH SKN CLS APL DERMABOND .7 (GAUZE/BANDAGES/DRESSINGS) ×1
APL SKNCLS STERI-STRIP NONHPOA (GAUZE/BANDAGES/DRESSINGS)
BAG DECANTER FOR FLEXI CONT (MISCELLANEOUS) ×2 IMPLANT
BENZOIN TINCTURE PRP APPL 2/3 (GAUZE/BANDAGES/DRESSINGS) IMPLANT
BLADE HEX COATED 2.75 (ELECTRODE) ×2 IMPLANT
BLADE SURG 15 STRL LF DISP TIS (BLADE) ×1 IMPLANT
BLADE SURG 15 STRL SS (BLADE) ×2
CANISTER SUCTION 1200CC (MISCELLANEOUS) IMPLANT
CHLORAPREP W/TINT 26ML (MISCELLANEOUS) ×2 IMPLANT
CLOTH BEACON ORANGE TIMEOUT ST (SAFETY) ×2 IMPLANT
COVER MAYO STAND STRL (DRAPES) ×2 IMPLANT
COVER TABLE BACK 60X90 (DRAPES) ×2 IMPLANT
DECANTER SPIKE VIAL GLASS SM (MISCELLANEOUS) ×1 IMPLANT
DERMABOND ADVANCED (GAUZE/BANDAGES/DRESSINGS) ×1
DERMABOND ADVANCED .7 DNX12 (GAUZE/BANDAGES/DRESSINGS) IMPLANT
DRAPE C-ARM 42X72 X-RAY (DRAPES) ×2 IMPLANT
DRAPE LAPAROTOMY TRNSV 102X78 (DRAPE) ×2 IMPLANT
DRAPE UTILITY XL STRL (DRAPES) ×2 IMPLANT
ELECT REM PT RETURN 9FT ADLT (ELECTROSURGICAL) ×2
ELECTRODE REM PT RTRN 9FT ADLT (ELECTROSURGICAL) ×1 IMPLANT
GAUZE SPONGE 4X4 16PLY XRAY LF (GAUZE/BANDAGES/DRESSINGS) ×1 IMPLANT
GLOVE BIO SURGEON STRL SZ 6.5 (GLOVE) ×2 IMPLANT
GLOVE BIO SURGEON STRL SZ7.5 (GLOVE) ×1 IMPLANT
GLOVE BIOGEL PI IND STRL 6.5 (GLOVE) IMPLANT
GLOVE BIOGEL PI INDICATOR 6.5 (GLOVE) ×2
GLOVE EUDERMIC 7 POWDERFREE (GLOVE) ×2 IMPLANT
GOWN PREVENTION PLUS XLARGE (GOWN DISPOSABLE) ×5 IMPLANT
IV CATH PLACEMENT UNIT 16 GA (IV SOLUTION) IMPLANT
IV HEPARIN 1000UNITS/500ML (IV SOLUTION) ×2 IMPLANT
IV KIT MINILOC 20X1 SAFETY (NEEDLE) IMPLANT
KIT BARDPORT ISP (Port) IMPLANT
KIT PORT POWER 8FR ISP CVUE (Catheter) ×1 IMPLANT
KIT PORT POWER ISP 8FR (Catheter) IMPLANT
KIT POWER PORT SLIM 6FR (PORTABLE EQUIPMENT SUPPLIES) IMPLANT
KIT PROBE COVER (MISCELLANEOUS) ×1 IMPLANT
NDL HYPO 25X1 1.5 SAFETY (NEEDLE) ×1 IMPLANT
NEEDLE HYPO 22GX1.5 SAFETY (NEEDLE) ×1 IMPLANT
NEEDLE HYPO 25X1 1.5 SAFETY (NEEDLE) ×2 IMPLANT
PACK BASIN DAY SURGERY FS (CUSTOM PROCEDURE TRAY) ×2 IMPLANT
PENCIL BUTTON HOLSTER BLD 10FT (ELECTRODE) ×2 IMPLANT
SET SHEATH INTRODUCER 10FR (MISCELLANEOUS) IMPLANT
SHEATH COOK PEEL AWAY SET 9F (SHEATH) ×1 IMPLANT
SHEET MEDIUM DRAPE 40X70 STRL (DRAPES) IMPLANT
SLEEVE SCD COMPRESS KNEE MED (MISCELLANEOUS) ×2 IMPLANT
STAPLER VISISTAT (STAPLE) IMPLANT
STRIP CLOSURE SKIN 1/2X4 (GAUZE/BANDAGES/DRESSINGS) IMPLANT
SUT MNCRL AB 4-0 PS2 18 (SUTURE) ×2 IMPLANT
SUT PROLENE 2 0 SH DA (SUTURE) ×2 IMPLANT
SUT VICRYL 3-0 CR8 SH (SUTURE) ×2 IMPLANT
SYR 5ML LUER SLIP (SYRINGE) ×2 IMPLANT
SYR CONTROL 10ML LL (SYRINGE) ×3 IMPLANT
TOWEL OR 17X24 6PK STRL BLUE (TOWEL DISPOSABLE) ×3 IMPLANT
TOWEL OR NON WOVEN STRL DISP B (DISPOSABLE) ×2 IMPLANT
TUBE CONNECTING 20X1/4 (TUBING) IMPLANT
WATER STERILE IRR 1000ML POUR (IV SOLUTION) ×1 IMPLANT
YANKAUER SUCT BULB TIP NO VENT (SUCTIONS) IMPLANT

## 2012-05-01 NOTE — Anesthesia Procedure Notes (Signed)
Procedure Name: LMA Insertion Performed by: Solon Alban, [place] Pre-anesthesia Checklist: Patient identified, Timeout performed, Emergency Drugs available, Suction available and Patient being monitored Patient Re-evaluated:Patient Re-evaluated prior to inductionOxygen Delivery Method: Circle system utilized Preoxygenation: Pre-oxygenation with 100% oxygen Intubation Type: IV induction Ventilation: Mask ventilation without difficulty LMA: LMA inserted LMA Size: 4.0 Number of attempts: 1 Placement Confirmation: breath sounds checked- equal and bilateral and positive ETCO2 Dental Injury: Teeth and Oropharynx as per pre-operative assessment      

## 2012-05-01 NOTE — H&P (Signed)
  Chief complaint: Needs Port-A-Cath for IV access for chemotherapy for breast cancer  History of present illness: This patient has undergone left mastectomy for breast cancer. She Port-A-Cath placed and received 2 chemotherapy treatments. She then developed a septic knee. During treatment for that her Port-A-Cath was removed. She now needs the port replaced. Infectious disease specialist feel it is safe to place at this time. She is continuing on antibiotics. She is not having any problems with the old port site or her mastectomy.  Past history family history review systems are in the electronic medical record and reviewed.  Exam: Vital signs:BP 130/76  Pulse 66  Temp 98 F (36.7 C) (Oral)  Resp 20  Ht 5\' 6"  (1.676 m)  Wt 190 lb 12.8 oz (86.546 kg)  BMI 30.80 kg/m2  SpO2 99% General: The patient alert oriented healthy-appearing Lungs: The lungs are clear to auscultation. Heart: Regular rhythm no murmurs rubs or gallops Chest wall: She status post left mastectomy with no evidence of infection or seroma. The port site in the right upper anterior chest is healed.  Data reviewed: Labs and notes from the infectious disease specialist  Impression: IV access needed for Herceptin chemotherapy  Plan: We'll proceed as scheduled today for Port-A-Cath placement under general anesthesia. All questions have been answered.

## 2012-05-01 NOTE — Anesthesia Preprocedure Evaluation (Signed)
Anesthesia Evaluation  Patient identified by MRN, date of birth, ID band Patient awake    Reviewed: Allergy & Precautions, H&P , NPO status , Patient's Chart, lab work & pertinent test results  Airway Mallampati: II  Neck ROM: full    Dental   Pulmonary sleep apnea ,          Cardiovascular hypertension,     Neuro/Psych Depression  Neuromuscular disease    GI/Hepatic   Endo/Other  Hypothyroidism   Renal/GU      Musculoskeletal   Abdominal   Peds  Hematology   Anesthesia Other Findings   Reproductive/Obstetrics                           Anesthesia Physical Anesthesia Plan  ASA: II  Anesthesia Plan: General   Post-op Pain Management:    Induction: Intravenous  Airway Management Planned: LMA  Additional Equipment:   Intra-op Plan:   Post-operative Plan:   Informed Consent: I have reviewed the patients History and Physical, chart, labs and discussed the procedure including the risks, benefits and alternatives for the proposed anesthesia with the patient or authorized representative who has indicated his/her understanding and acceptance.     Plan Discussed with: CRNA and Surgeon  Anesthesia Plan Comments:         Anesthesia Quick Evaluation

## 2012-05-01 NOTE — Op Note (Signed)
Julie Ross 10-03-1960 [id number] 04/27/2012  Preoperative diagnosis: breast cancer, needs IV access for chemotherapy  Postoperative diagnosis: same  Procedure: Port-A-Cath placement  Surgeon: Currie Paris, MD, FACS  Assistant: Dr. Dwan Bolt  Anesthesia: General   Clinical History and Indications: this patient had a previously placed port that had to be removed which developed a knee infection and there was concern that the port but subsequently become infected. She comes in for port placement.    Description of Procedure: I saw the patient got very confirmed the plans. The patient taken to the operating room. After satisfactory general anesthesia was obtained the upper chest and lower neck were prepped and draped. A timeout was done.  I attempted to enter the right subclavian vein through the prior location for the port. It was always venous return and threaded the guidewire easily. I thought it went into the superior vena cava but in retrospect it had entered the subclavian artery instead of the subclavian vein.  I opened the old scar of prior port site excising the skin scar. I fashioned a pocket. I threaded the Port-A-Cath tubing into the guidewire site using dilator peel-away sheath was able to easily threaded and advanced. However this point it was recognized that I was actually in the subclavian artery. The catheter was removed and I held 10 minutes of pressure. There did not appear to be a hematoma. Second attempt was made to the vein but again arterial puncture occurred. Again 2 minutes of pressure were held.  At this point anesthesiology team and this is getting venous access to the internal jugular vein. His initial symptom was a "finder" needle was unsuccessful so using ultrasound identified the jugular vein and was able to air distally threaded the guidewire into the superior vena cava right atrial area.  The tubing was then threadedfrom subcutaneous pocket the  guidewire entry site. Using dilator and peel-away sheath was able to position the tip of the catheter in the superior vena cava using fluoroscopic guidance. It aspirated and flushed easily. The reservoir was flushed the past and this aspirated and flushed easily. Profile check for positioning with fluoroscopy and everything appeared to be in good position. The reservoir was flushed with concentrated aqueous heparin. Secured in the pocket with 2-0 Prolene's. The skin was closed with 4-0 Monocryl subcuticular and Dermabond. At the end the case there did not appear to be a hematoma develop in the area the subclavian puncture.   The procedure well. Operative complication was accidental puncture of subclavian artery. As a blood loss was about 20 cc. There no other operative complications. Counts were correct.  Currie Paris, MD, FACS 05/01/2012 10:46 AM

## 2012-05-02 ENCOUNTER — Ambulatory Visit: Payer: BC Managed Care – PPO

## 2012-05-02 ENCOUNTER — Ambulatory Visit
Admit: 2012-05-02 | Discharge: 2012-05-02 | Disposition: A | Discharge status: Home / Self Care | Payer: BC Managed Care – PPO | Attending: Radiation Oncology | Admitting: Radiation Oncology

## 2012-05-03 ENCOUNTER — Encounter (HOSPITAL_BASED_OUTPATIENT_CLINIC_OR_DEPARTMENT_OTHER): Payer: Self-pay | Admitting: Surgery

## 2012-05-03 ENCOUNTER — Ambulatory Visit: Payer: BC Managed Care – PPO

## 2012-05-03 ENCOUNTER — Ambulatory Visit
Admit: 2012-05-03 | Discharge: 2012-05-03 | Disposition: A | Discharge status: Home / Self Care | Payer: BC Managed Care – PPO | Attending: Radiation Oncology | Admitting: Radiation Oncology

## 2012-05-03 ENCOUNTER — Encounter: Payer: Self-pay | Admitting: Oncology

## 2012-05-03 NOTE — Progress Notes (Signed)
Put cancer form in registration desk. °

## 2012-05-04 ENCOUNTER — Ambulatory Visit
Admit: 2012-05-04 | Discharge: 2012-05-04 | Disposition: A | Discharge status: Home / Self Care | Payer: BC Managed Care – PPO | Attending: Radiation Oncology | Admitting: Radiation Oncology

## 2012-05-04 ENCOUNTER — Ambulatory Visit: Payer: BC Managed Care – PPO

## 2012-05-07 ENCOUNTER — Ambulatory Visit
Admit: 2012-05-07 | Discharge: 2012-05-07 | Disposition: A | Discharge status: Home / Self Care | Payer: BC Managed Care – PPO | Attending: Radiation Oncology | Admitting: Radiation Oncology

## 2012-05-07 ENCOUNTER — Ambulatory Visit: Payer: BC Managed Care – PPO

## 2012-05-08 ENCOUNTER — Encounter: Payer: Self-pay | Admitting: Radiation Oncology

## 2012-05-08 ENCOUNTER — Ambulatory Visit
Admit: 2012-05-08 | Discharge: 2012-05-08 | Disposition: A | Discharge status: Home / Self Care | Payer: BC Managed Care – PPO | Attending: Radiation Oncology | Admitting: Radiation Oncology

## 2012-05-08 ENCOUNTER — Ambulatory Visit: Payer: BC Managed Care – PPO

## 2012-05-08 ENCOUNTER — Ambulatory Visit
Admission: RE | Admit: 2012-05-08 | Discharge: 2012-05-08 | Disposition: A | Discharge status: Home / Self Care | Payer: BC Managed Care – PPO | Source: Ambulatory Visit | Attending: Radiation Oncology | Admitting: Radiation Oncology

## 2012-05-08 VITALS — BP 99/60 | HR 51 | Temp 97.8°F | Resp 20 | Wt 195.6 lb

## 2012-05-08 DIAGNOSIS — C50419 Malignant neoplasm of upper-outer quadrant of unspecified female breast: Secondary | ICD-10-CM

## 2012-05-08 MED ORDER — OXYCODONE HCL 20 MG PO TB12: 20.0000 mg | ORAL_TABLET | Freq: Two times a day (BID) | ORAL | Status: DC

## 2012-05-08 NOTE — Progress Notes (Signed)
[facility]    Radiation Oncology [address] [place]     Maryln Gottron, M.D. [place], [place] [address]               Billie Lade, M.D., Ph.D. Phone: [phone]      Molli Hazard A. Kathrynn Running, M.D. Fax: [phone]      Radene Gunning, M.D., Ph.D.         Lurline Hare, M.D.         Grayland Jack, M.D Weekly Treatment Management Note  Name: Julie Ross     MRN: [id number]        CSN: [id number] Date: 05/08/2012      DOB: [birth year]  CC: Julie Ross    Status: Outpatient  Diagnosis: The encounter diagnosis was Cancer of upper-outer quadrant of female breast.  Current Dose: 2700 cGy   Current Fraction: 15  Planned Dose: 5940 cGy  Narrative: Julie Ross was seen today for weekly treatment management. The chart was checked and port films  were reviewed. She continues to have a lot of fatigue.  This is in part related to her  difficulty with sleeping in light of her knee pain. She does take hydrocodone which does not seem to be helping very well.  I have given the patient a limited prescription of OxyContin to use.  She denies any itching along the chest wall area.  Review of patient's allergies indicates no known allergies. Current Outpatient Prescriptions  Medication Sig Dispense Refill  . acetaminophen (TYLENOL) 325 MG tablet Take 1-2 tablets (325-650 mg total) by mouth every 4 (four) hours as needed.      . ALPRAZolam (XANAX) 0.5 MG tablet Take 1 tablet (0.5 mg total) by mouth 3 (three) times daily as needed for anxiety.  15 tablet  0  . atenolol (TENORMIN) 25 MG tablet Take 0.5 tablets (12.5 mg total) by mouth daily. For blood pressure  15 tablet  0  . bacitracin ointment Apply topically 2 (two) times daily.  120 g    . CALCIUM-MAGNESIUM PO Take 1 tablet by mouth daily.      . cephALEXin (KEFLEX) 500 MG capsule Take 1 capsule (500 mg total) by mouth 4 (four) times daily.  120 capsule  4  . clonazePAM (KLONOPIN) 0.5 MG tablet Take 0.5  mg by mouth Nightly.      . docusate sodium (COLACE) 100 MG capsule Take 100 mg by mouth daily.      . DULoxetine (CYMBALTA) 60 MG capsule Take 60 mg by mouth.      . esomeprazole (NEXIUM) 40 MG capsule Take 40 mg by mouth daily before breakfast.      . gabapentin (NEURONTIN) 100 MG capsule Take 1 capsule (100 mg total) by mouth 3 (three) times daily. For neuropathy  90 capsule  1  . hydrochlorothiazide (HYDRODIURIL) 25 MG tablet Take 1 tablet (25 mg total) by mouth daily. For blood pressure.  30 tablet  1  . hydrOXYzine (ATARAX/VISTARIL) 25 MG tablet Take 1 tablet (25 mg total) by mouth 3 (three) times daily as needed for itching.  90 tablet  1  . IRON PO Take 45 mg by mouth every other day.       . lidocaine-prilocaine (EMLA) cream APPLY AS NEEDED  30 g  0  . losartan (COZAAR) 100 MG tablet Take 1 tablet (100 mg total) by mouth daily. For blood pressure  30  tablet  1  . Multiple Vitamins-Minerals (MULTIVITAMINS THER. W/MINERALS) TABS Take 1 tablet by mouth daily.      . Probiotic Product (PROBIOTIC DAILY PO) Take by mouth.      . prochlorperazine (COMPAZINE) 10 MG tablet TAKE 1 TABLET BY MOUTH EVERY 6 HOURS AS NEEDED FOR NAUSEA AND VOMITING  30 tablet  2  . HYDROcodone-acetaminophen (NORCO) 10-325 MG per tablet every 8 (eight) hours as needed.       Marland Kitchen oxyCODONE (OXYCONTIN) 20 MG 12 hr tablet Take 1 tablet (20 mg total) by mouth every 12 (twelve) hours.  20 tablet  0   No current facility-administered medications for this encounter.   Facility-Administered Medications Ordered in Other Encounters  Medication Dose Route Frequency Provider Last Rate Last Dose  . ceFAZolin (ANCEF) IVPB 2 g/50 mL premix    PRN Lance Coon, CRNA   2 g at 05/01/12 0855  . dexamethasone (DECADRON) injection    PRN Lance Coon, CRNA   8 mg at 05/01/12 0908  . fentaNYL (SUBLIMAZE) injection    PRN Lance Coon, CRNA   100 mcg at 05/01/12 0856  . lidocaine (cardiac) 100 mg/67ml (XYLOCAINE) 20 MG/ML injection 2%     PRN Lance Coon, CRNA   60 mg at 05/01/12 0856  . midazolam (VERSED) 5 MG/5ML injection    PRN Lance Coon, CRNA   2 mg at 05/01/12 0856  . propofol (DIPRIVAN) 10 mg/mL bolus/IV push    PRN Lance Coon, CRNA   160 mg at 05/01/12 0900   Labs:  Lab Results  Component Value Date   WBC 8.2 04/20/2012   HGB 9.7* 04/20/2012   HCT 29.0* 04/20/2012   MCV 83.1 04/20/2012   PLT 274 04/20/2012   Lab Results  Component Value Date   CREATININE 1.10 04/30/2012   BUN 20 04/30/2012   NA 135 04/30/2012   K 4.0 04/30/2012   CL 96 04/30/2012   CO2 26 04/30/2012   Lab Results  Component Value Date   ALT 7 04/20/2012   AST 14 04/20/2012   PHOS 4.6 03/16/2012   BILITOT 0.77 04/20/2012    Physical Examination:  weight is 195 lb 9.6 oz (88.724 kg). Her oral temperature is 97.8 F (36.6 C). Her blood pressure is 99/60 and her pulse is 51. Her respiration is 20.    Wt Readings from Last 3 Encounters:  05/08/12 195 lb 9.6 oz (88.724 kg)  05/01/12 190 lb 12.8 oz (86.546 kg)  05/01/12 190 lb 12.8 oz (86.546 kg)     Lungs - Normal respiratory effort, chest expands symmetrically. Lungs are clear to auscultation, no crackles or wheezes.  Heart has regular rhythm and rate  Abdomen is soft and non tender with normal bowel sounds The left upper chest near the clavicle shows some radiation dermatitis but no skin breakdown is appreciated  Assessment:  Patient tolerating treatments well  Plan: Continue treatment per original radiation prescription

## 2012-05-08 NOTE — Progress Notes (Signed)
Patient here weekly rad txs: left breast/chestwall, 15 completed so far, sitting and staanding b/p, taken, very fatigued, sitting=9b/p=99/60,p=51,rr=20,standing b/p=103/57,p=57,rr=20 ,no c/o pain left chest area, radiaplex daily, slight erythema supraclavicular area,skin intact  2:29 PM

## 2012-05-09 ENCOUNTER — Ambulatory Visit: Payer: BC Managed Care – PPO

## 2012-05-09 ENCOUNTER — Ambulatory Visit
Admit: 2012-05-09 | Discharge: 2012-05-09 | Disposition: A | Discharge status: Home / Self Care | Payer: BC Managed Care – PPO | Attending: Radiation Oncology | Admitting: Radiation Oncology

## 2012-05-10 ENCOUNTER — Ambulatory Visit
Admit: 2012-05-10 | Discharge: 2012-05-10 | Disposition: A | Discharge status: Home / Self Care | Payer: BC Managed Care – PPO | Attending: Radiation Oncology | Admitting: Radiation Oncology

## 2012-05-10 ENCOUNTER — Ambulatory Visit: Payer: BC Managed Care – PPO

## 2012-05-11 ENCOUNTER — Ambulatory Visit (HOSPITAL_BASED_OUTPATIENT_CLINIC_OR_DEPARTMENT_OTHER): Payer: BC Managed Care – PPO | Admitting: Adult Health

## 2012-05-11 ENCOUNTER — Ambulatory Visit: Payer: BC Managed Care – PPO

## 2012-05-11 ENCOUNTER — Ambulatory Visit: Payer: BC Managed Care – PPO | Admitting: Adult Health

## 2012-05-11 ENCOUNTER — Telehealth: Payer: Self-pay | Admitting: Oncology

## 2012-05-11 ENCOUNTER — Other Ambulatory Visit: Payer: BC Managed Care – PPO | Admitting: Lab

## 2012-05-11 ENCOUNTER — Ambulatory Visit
Admit: 2012-05-11 | Discharge: 2012-05-11 | Disposition: A | Discharge status: Home / Self Care | Payer: BC Managed Care – PPO | Attending: Radiation Oncology | Admitting: Radiation Oncology

## 2012-05-11 ENCOUNTER — Encounter: Payer: Self-pay | Admitting: Adult Health

## 2012-05-11 ENCOUNTER — Other Ambulatory Visit (HOSPITAL_BASED_OUTPATIENT_CLINIC_OR_DEPARTMENT_OTHER): Payer: BC Managed Care – PPO | Admitting: Lab

## 2012-05-11 ENCOUNTER — Ambulatory Visit (HOSPITAL_BASED_OUTPATIENT_CLINIC_OR_DEPARTMENT_OTHER): Payer: BC Managed Care – PPO

## 2012-05-11 VITALS — BP 124/72 | HR 59 | Temp 97.6°F | Resp 20 | Ht 66.0 in | Wt 190.9 lb

## 2012-05-11 DIAGNOSIS — C50419 Malignant neoplasm of upper-outer quadrant of unspecified female breast: Secondary | ICD-10-CM

## 2012-05-11 DIAGNOSIS — Z5112 Encounter for antineoplastic immunotherapy: Secondary | ICD-10-CM

## 2012-05-11 DIAGNOSIS — C773 Secondary and unspecified malignant neoplasm of axilla and upper limb lymph nodes: Secondary | ICD-10-CM

## 2012-05-11 DIAGNOSIS — Z170 Estrogen receptor positive status [ER+]: Secondary | ICD-10-CM

## 2012-05-11 LAB — COMPREHENSIVE METABOLIC PANEL (CC13)
ALT: 8 U/L (ref 0–55)
AST: 14 U/L (ref 5–34)
Albumin: 3.5 g/dL (ref 3.5–5.0)
Alkaline Phosphatase: 172 U/L — ABNORMAL HIGH (ref 40–150)
BUN: 23 mg/dL (ref 7.0–26.0)
CO2: 28 mEq/L (ref 22–29)
Calcium: 9.6 mg/dL (ref 8.4–10.4)
Chloride: 98 mEq/L (ref 98–107)
Creatinine: 1.3 mg/dL — ABNORMAL HIGH (ref 0.6–1.1)
Glucose: 109 mg/dl — ABNORMAL HIGH (ref 70–99)
Potassium: 4.1 mEq/L (ref 3.5–5.1)
Sodium: 132 mEq/L — ABNORMAL LOW (ref 136–145)
Total Bilirubin: 0.44 mg/dL (ref 0.20–1.20)
Total Protein: 7.3 g/dL (ref 6.4–8.3)

## 2012-05-11 LAB — CBC WITH DIFFERENTIAL/PLATELET
BASO%: 0.4 % (ref 0.0–2.0)
Basophils Absolute: 0 10*3/uL (ref 0.0–0.1)
EOS%: 2.2 % (ref 0.0–7.0)
Eosinophils Absolute: 0.2 10*3/uL (ref 0.0–0.5)
HCT: 30.3 % — ABNORMAL LOW (ref 34.8–46.6)
HGB: 10.1 g/dL — ABNORMAL LOW (ref 11.6–15.9)
LYMPH%: 12.3 % — ABNORMAL LOW (ref 14.0–49.7)
MCH: 27.8 pg (ref 25.1–34.0)
MCHC: 33.3 g/dL (ref 31.5–36.0)
MCV: 83.5 fL (ref 79.5–101.0)
MONO#: 0.4 10*3/uL (ref 0.1–0.9)
MONO%: 5.1 % (ref 0.0–14.0)
NEUT#: 6.1 10*3/uL (ref 1.5–6.5)
NEUT%: 80 % — ABNORMAL HIGH (ref 38.4–76.8)
Platelets: 253 10*3/uL (ref 145–400)
RBC: 3.63 10*6/uL — ABNORMAL LOW (ref 3.70–5.45)
RDW: 13.8 % (ref 11.2–14.5)
WBC: 7.7 10*3/uL (ref 3.9–10.3)
lymph#: 0.9 10*3/uL (ref 0.9–3.3)
nRBC: 0 % (ref 0–0)

## 2012-05-11 MED ORDER — TRASTUZUMAB CHEMO INJECTION 440 MG
6.0000 mg/kg | Freq: Once | INTRAVENOUS | Status: AC
  Administered 2012-05-11: 525 mg via INTRAVENOUS
  Filled 2012-05-11: qty 25

## 2012-05-11 MED ORDER — SODIUM CHLORIDE 0.9 % IV SOLN
Freq: Once | INTRAVENOUS | Status: AC
  Administered 2012-05-11: 15:00:00 via INTRAVENOUS

## 2012-05-11 MED ORDER — ACETAMINOPHEN 325 MG PO TABS
650.0000 mg | ORAL_TABLET | Freq: Once | ORAL | Status: AC
  Administered 2012-05-11: 650 mg via ORAL

## 2012-05-11 MED ORDER — HEPARIN SOD (PORK) LOCK FLUSH 100 UNIT/ML IV SOLN
500.0000 [IU] | Freq: Once | INTRAVENOUS | Status: AC | PRN
  Administered 2012-05-11: 500 [IU]
  Filled 2012-05-11: qty 5

## 2012-05-11 MED ORDER — DIPHENHYDRAMINE HCL 25 MG PO CAPS
50.0000 mg | ORAL_CAPSULE | Freq: Once | ORAL | Status: AC
  Administered 2012-05-11: 50 mg via ORAL

## 2012-05-11 MED ORDER — SODIUM CHLORIDE 0.9 % IJ SOLN
10.0000 mL | INTRAMUSCULAR | Status: DC | PRN
  Administered 2012-05-11: 10 mL
  Filled 2012-05-11: qty 10

## 2012-05-11 NOTE — Telephone Encounter (Signed)
S/w the pt and she is aware of her appt with dr benshimon in dec and to pick up a revised feb and march 2014 appt calendars.

## 2012-05-11 NOTE — Progress Notes (Signed)
OFFICE PROGRESS NOTE  CC  [name] [address] [place] [address] Dr. Cyndia Bent Dr. Richardean Chimera Dr. Mardella Layman Dr. Gustavus Messing ([facility])  DIAGNOSIS: 51 year old female who originally was seen in the multidisciplinary breast clinic for discussion of new diagnosis of left breast cancer. She is now status post left mastectomy with the final pathology revealing an invasive ductal carcinoma that is ER positive PR positive HER-2/neu positive with Ki-67 11%.  PRIOR THERAPY:  #1 patient was originally seen in the multidisciplinary breast clinic on 11/30/2011 for what at that time looked like a ductal carcinoma in situ of the left breast. Due to the extent of disease she was recommended a mastectomy with axillary lymph node sampling.  #2 12/19/2011 patient had a left mastectomy with sentinel lymph node biopsy. Her final pathology revealed 2 foci of invasive ductal carcinoma measuring 0.8 and 0.6 cm. The tumor was low grade ER positive PR positive. Patient's tumor was found to be HER-2/neu positive with HER-2 ratio of 3.27. Ki-67 was 11%. Patient had sentinel lymph node  Biopsies performed one of 4 lymph nodes were positive for metastatic disease. Patient's final pathologic staging is T1 N1 M0. Stage II  #3 patient has received adjuvant chemotherapy consisting of Taxotere carboplatinum and Herceptin for 2 cycles. This was then complicated by development of bacteremia left knee sepsis requiring revision of the knee. Port-A-Cath site infection requiring removal of the Port-A-Cath and placement of a PICC line.  #4 patient will now receive Herceptin every 3 weeks with radiation therapy this is to continue until patient finishes up her antibiotics then we may consider her completing her 2 cycles of Taxotere and carboplatinum combination.  CURRENT THERAPY: Patient will proceed with Herceptin every 3 weeks  INTERVAL HISTORY: Julie Ross 51  y.o. female returns for Followup visit. She's tolerating the Herceptin well.  She had her port replaced, and remains on oral keflex for her recent septic left knee.  Shes doing well with radiation as well.  She's due to see cardiology again in December/jan.    MEDICAL HISTORY: Past Medical History  Diagnosis Date  . Hypertension   . Hypothyroidism   . Arthritis   . Neuromuscular disorder     MS TX WITH CYMBALTA   . Multiple sclerosis   . H/O colonoscopy   . H/O bone density study 03/2011  . Wears glasses   . Heart burn   . Depression   . Sleep apnea     MILD SLEEP APNEA, NO MACHINE  6 YRS AGO HPT [facility]   . Breast cancer     lul/ER+PR+    ALLERGIES:   has no known allergies.  MEDICATIONS:  Current Outpatient Prescriptions  Medication Sig Dispense Refill  . acetaminophen (TYLENOL) 325 MG tablet Take 1-2 tablets (325-650 mg total) by mouth every 4 (four) hours as needed.      . ALPRAZolam (XANAX) 0.5 MG tablet Take 1 tablet (0.5 mg total) by mouth 3 (three) times daily as needed for anxiety.  15 tablet  0  . atenolol (TENORMIN) 25 MG tablet Take 0.5 tablets (12.5 mg total) by mouth daily. For blood pressure  15 tablet  0  . bacitracin ointment Apply topically 2 (two) times daily.  120 g    . CALCIUM-MAGNESIUM PO Take 1 tablet by mouth daily.      . cephALEXin (KEFLEX) 500 MG capsule Take 1 capsule (500 mg total) by mouth 4 (four) times daily.  120 capsule  4  . clonazePAM (KLONOPIN) 0.5 MG tablet Take 0.5 mg by mouth Nightly.      . docusate sodium (COLACE) 100 MG capsule Take 100 mg by mouth daily.      . DULoxetine (CYMBALTA) 60 MG capsule Take 60 mg by mouth.      . esomeprazole (NEXIUM) 40 MG capsule Take 40 mg by mouth daily before breakfast.      . gabapentin (NEURONTIN) 100 MG capsule Take 1 capsule (100 mg total) by mouth 3 (three) times daily. For neuropathy  90 capsule  1  . hydrochlorothiazide (HYDRODIURIL) 25 MG tablet Take 1 tablet (25 mg total) by mouth daily. For  blood pressure.  30 tablet  1  . HYDROcodone-acetaminophen (NORCO) 10-325 MG per tablet every 8 (eight) hours as needed.       . hydrOXYzine (ATARAX/VISTARIL) 25 MG tablet Take 1 tablet (25 mg total) by mouth 3 (three) times daily as needed for itching.  90 tablet  1  . IRON PO Take 45 mg by mouth every other day.       . lidocaine-prilocaine (EMLA) cream APPLY AS NEEDED  30 g  0  . losartan (COZAAR) 100 MG tablet Take 1 tablet (100 mg total) by mouth daily. For blood pressure  30 tablet  1  . Multiple Vitamins-Minerals (MULTIVITAMINS THER. W/MINERALS) TABS Take 1 tablet by mouth daily.      Marland Kitchen oxyCODONE (OXYCONTIN) 20 MG 12 hr tablet Take 1 tablet (20 mg total) by mouth every 12 (twelve) hours.  20 tablet  0  . Probiotic Product (PROBIOTIC DAILY PO) Take by mouth.      . prochlorperazine (COMPAZINE) 10 MG tablet TAKE 1 TABLET BY MOUTH EVERY 6 HOURS AS NEEDED FOR NAUSEA AND VOMITING  30 tablet  2   No current facility-administered medications for this visit.   Facility-Administered Medications Ordered in Other Visits  Medication Dose Route Frequency Provider Last Rate Last Dose  . ceFAZolin (ANCEF) IVPB 2 g/50 mL premix    PRN Lance Coon, CRNA   2 g at 05/01/12 0855  . dexamethasone (DECADRON) injection    PRN Lance Coon, CRNA   8 mg at 05/01/12 0908  . fentaNYL (SUBLIMAZE) injection    PRN Lance Coon, CRNA   100 mcg at 05/01/12 0856  . lidocaine (cardiac) 100 mg/59ml (XYLOCAINE) 20 MG/ML injection 2%    PRN Lance Coon, CRNA   60 mg at 05/01/12 0856  . midazolam (VERSED) 5 MG/5ML injection    PRN Lance Coon, CRNA   2 mg at 05/01/12 0856  . propofol (DIPRIVAN) 10 mg/mL bolus/IV push    PRN Lance Coon, CRNA   160 mg at 05/01/12 0900    SURGICAL HISTORY:  Past Surgical History  Procedure Date  . Tonsillectomy   . Tumor removal     FROM PELVIS AGE 15  . Cesarean section     X2   . No past surgeries     BLADDER SLING  4-5 YRS AGO   . Knee arthroscopy     LT KNEE  04/2011  . Total knee arthroplasty 08/15/2011    Procedure: TOTAL KNEE ARTHROPLASTY; lft Surgeon: Harvie Junior, MD;  Location: [facility] OR;  Service: Orthopedics;  Laterality: Left;  RIGHT KNEE CORTIZONE INJECTION  . Mohs surgery     right face  . Knee arthroscopy 82    rt  . Joint replacement Feb 2013  . Mastectomy w/ sentinel node biopsy 12/19/2011  Procedure: MASTECTOMY WITH SENTINEL LYMPH NODE BIOPSY;  Surgeon: Currie Paris, MD;  Location: [facility] OR;  Service: General;  Laterality: Left;  left breast and left axilla   . Portacath placement 01/16/2012    Procedure: INSERTION PORT-A-CATH;  Surgeon: Currie Paris, MD;  Location: [facility];  Service: General;  Laterality: Right;  Porta Cath Placement   . I&d knee with poly exchange 03/02/2012    Procedure: IRRIGATION AND DEBRIDEMENT KNEE WITH POLY EXCHANGE;  Surgeon: Harvie Junior, MD;  Location: [facility] OR;  Service: Orthopedics;  Laterality: Left;  I&D of total knee with Possible Poly Exchange   . Tee without cardioversion 03/06/2012    Procedure: TRANSESOPHAGEAL ECHOCARDIOGRAM (TEE);  Surgeon: Wendall Stade, MD;  Location: [facility];  Service: Cardiovascular;  Laterality: N/A;  Rm. [id number]  . Port-a-cath removal 03/06/2012    Procedure: REMOVAL PORT-A-CATH;  Surgeon: Adolph Pollack, MD;  Location: [facility] OR;  Service: General;  Laterality: N/A;  . Portacath placement 05/01/2012    Procedure: INSERTION PORT-A-CATH;  Surgeon: Currie Paris, MD;  Location: [facility];  Service: General;  Laterality: Right;  right internal jugular port-a-cath insertion    REVIEW OF SYSTEMS:   General: fatigue (-), night sweats (-), fever (-), pain (-) Lymph: palpable nodes (-) HEENT: vision changes (-), mucositis (-), gum bleeding (-), epistaxis (-) Cardiovascular: chest pain (-), palpitations (-) Pulmonary: shortness of breath (-), dyspnea on exertion (-), cough (-), hemoptysis (-) GI:  Early satiety (-), melena (-), dysphagia  (-), nausea/vomiting (-), diarrhea (-) GU: dysuria (-), hematuria (-), incontinence (-) Musculoskeletal: joint swelling (-), joint pain (-), back pain (-) Neuro: weakness (-), numbness (-), headache (-), confusion (-) Skin: Rash (-), lesions (-), dryness (-) Psych: depression (-), suicidal/homicidal ideation (-), feeling of hopelessness (-)   PHYSICAL EXAMINATION:  BP 124/72  Pulse 59  Temp 97.6 F (36.4 C) (Oral)  Resp 20  Ht 5\' 6"  (1.676 m)  Wt 190 lb 14.4 oz (86.592 kg)  BMI 30.81 kg/m2 General: Patient is a well appearing female in no acute distress HEENT: PERRLA, sclerae anicteric no conjunctival pallor, MMM Neck: supple, no palpable adenopathy Lungs: clear to auscultation bilaterally, no wheezes, rhonchi, or rales Cardiovascular: regular rate rhythm, S1, S2, no murmurs, rubs or gallops Abdomen: Soft, non-tender, non-distended, normoactive bowel sounds, no HSM Extremities: warm and well perfused, no clubbing, cyanosis, or edema Skin: No rashes or lesions Neuro: Non-focal ECOG PERFORMANCE STATUS: 1 - Symptomatic but completely ambulatory  LABORATORY DATA: Lab Results  Component Value Date   WBC 7.7 05/11/2012   HGB 10.1* 05/11/2012   HCT 30.3* 05/11/2012   MCV 83.5 05/11/2012   PLT 253 05/11/2012      Chemistry      Component Value Date/Time   NA 135 04/30/2012 1530   NA 136 04/20/2012 1203   K 4.0 04/30/2012 1530   K 4.3 04/20/2012 1203   CL 96 04/30/2012 1530   CL 101 04/20/2012 1203   CO2 26 04/30/2012 1530   CO2 27 04/20/2012 1203   BUN 20 04/30/2012 1530   BUN 19.0 04/20/2012 1203   CREATININE 1.10 04/30/2012 1530   CREATININE 1.1 04/20/2012 1203      Component Value Date/Time   CALCIUM 9.8 04/30/2012 1530   CALCIUM 10.0 04/20/2012 1203   ALKPHOS 178* 04/20/2012 1203   ALKPHOS 494* 03/09/2012 0535   AST 14 04/20/2012 1203   AST 18 03/09/2012 0535   ALT 7 04/20/2012 1203  ALT 11 03/09/2012 0535   BILITOT 0.77 04/20/2012 1203   BILITOT 0.2* 03/09/2012 0535       DOB: December 31, 1960 Age: 12 Gender: F Client Name [place] Collected Date: 12/19/2011 Received Date: 12/19/2011 Physician: Cyndia Bent Chart #: MRN # : [id number] Physician cc: Richardean Chimera, MD Sherian Rein, MD Race:W Visit #: [id number] Kaylyn Lim, RN REPORT OF SURGICAL PATHOLOGY ADDITIONAL INFORMATION: 5. PROGNOSTIC INDICATORS - ACIS Results IMMUNOHISTOCHEMICAL AND MORPHOMETRIC ANALYSIS BY THE AUTOMATED CELLULAR IMAGING SYSTEM (ACIS) Estrogen Receptor (Negative, <1%): 100%, STRONG STAINING INTENSITY Progesterone Receptor (Negative, <1%): 100%, STRONG STAINING INTENSITY Proliferation Marker Ki67 by M IB-1 (Low<20%): 11% All controls stained appropriately Pecola Leisure MD Pathologist, Electronic Signature ( Signed 12/29/2011) 5. CHROMOGENIC IN-SITU HYBRIDIZATION Interpretation: HER2/NEU BY CISH - SHOWS AMPLIFICATION BY CISH ANALYSIS. THE RATIO OF HER2: CEP 17 SIGNALS WAS 3.27 Reference range: Ratio: HER2:CEP17 < 1.8 gene amplification not observed Ratio: HER2:CEP 17 1.8-2.2 - equivocal result Ratio: HER2:CEP17 > 2.2 - gene amplification observed Pecola Leisure MD Pathologist, Electronic Signature ( Signed 12/27/2011) 1 of 4 FINAL for Scarbrough, Antonieta M ([id number]) FINAL DIAGNOSIS Diagnosis 1. Lymph node, sentinel, biopsy, Left axillary - ONE BENIGN LYMPH NODE WITH NO TUMOR SEEN (0/1). 2. Lymph node, sentinel, biopsy, Left axilla - ONE LYMPH NODE POSITIVE FOR METASTATIC DUCTAL CARCINOMA (1/1). - SEE COMMENT. 3. Lymph node, sentinel, biopsy, Left axilla - ONE BENIGN LYMPH NODE WITH NO TUMOR SEEN (0/1). 4. Lymph node, sentinel, biopsy, Left axilla - ONE BENIGN LYMPH NODE WITH NO TUMOR SEEN (0/1). 5. Breast, simple mastectomy, Left - INVASIVE GRADE I DUCTAL CARCINOMA, TWO FOCI MEASURING 0.8 CM AND 0.6 CM ARISING IN A BACKGROUND OF EXTENSIVE INTERMEDIATE GRADE DUCTAL CARCINOMA IN SITU WITH NECROSIS. - MARGINS ARE NEGATIVE. - SEE ONCOLOGY  TEMPLATE. Microscopic Comment 2. On re-review of the frozen section slides, no metastatic carcinoma is identified. The carcinoma present is on permanent sections only. 5. BREAST, INVASIVE TUMOR, WITH LYMPH NODE SAMPLING Specimen, including laterality: Left breast with sentinel lymph node. Procedure: Left simple mastectomy with sentinel lymph node biopsy. Grade: I (Both foci of tumors show similar morphology). Tubule formation: 2. Nuclear pleomorphism: 2. Mitotic: 1. Tumor size (glass slide measurement): Two foci measuring 0.8 cm and 0.6 cm. Margins: Invasive, distance to closest margin: At least 1.5 cm. In-situ, distance to closest margin: At least 1.5 cm. Lymphovascular invasion: No definite lymph/vascular invasion is not identified however a sentinel lymph node is positive (see below). Ductal carcinoma in situ: Yes. Grade: Intermediate grade. Extensive intraductal component: Yes. Lobular neoplasia: Not identified. Tumor focality: Two foci. Treatment effect: Not applicable. Extent of tumor: Tumor confined to breast parenchyma. Lymph nodes: # examined: 4. Lymph nodes with metastasis: 1. Macrometastasis: (> 2.0 mm): 1. Extracapsular extension: Not identified. Breast prognostic profile: A breast prognostic profile will be performed (see below comment). Additional breast findings: Fibrocystic changes with usual ductal hyperplasia. TNM: mpT1b, pN1a, MX. Comment: The palpable nodular area seen grossly is comprised predominantly of intermediate grade ductal carcinoma in situ with only two foci of invasive ductal carcinoma. Both invasive foci are morphologically similar. As such a breast prognostic profile will only be performed on the larger tumor. A breast prognostic profile can be performed upon the smaller tumor per request. (RH:kh 12-21-11) 2 of 4 FINAL for Rix, Camay M ([id number]) Microscopic Comment(continued) Zandra Abts MD Pathologist, Electronic Signature (Case signed  12/21/2011) Intraoperative Diagnosis 1. RECEIVED FRESH IS TISSUE THAT CONTAINS A LYMPH NODE WHICH IS SAMPLED FOR RIOC. LEFT AXILLARY  SENTINEL LYMPH NODE, FROZEN SECTION AND TOUCH PREPARATION- ONE LYMPH NODE WITH NO METASTATIC CARCINOMA IDENTIFIED. (RAH) 2. RECEIVED FRESH IS TISSUE THAT CONTAINS A LYMPH NODE WHICH IS SAMPLED FOR RIOC. LEFT AXILLARY SENTINEL LYMPH NODE, FROZEN SECTION AND TOUCH PREPARATION- ONE LYMPH NODE WITH NO METASTATIC CARCINOMA IDENTIFIED. (RAH) 3. RECEIVED FRESH IS TISSUE THAT CONTAINS A LYMPH NODE WHICH IS SAMPLED FOR RIOC. LEFT AXILLARY SENTINEL LYMPH NODE, FROZEN SECTION AND TOUCH PREPARATION- ONE LYMPH NODE WITH NO METASTATIC CARCINOMA IDENTIFIED. (RAH) 4. RECEIVED FRESH IS TISSUE THAT CONTAINS A LYMPH NODE WHICH IS SAMPLED FOR RIOC. LEFT AXILLARY SENTINEL LYMPH NODE, FROZEN SECTION AND TOUCH PREPARATION- ONE LYMPH NODE WITH NO METASTATIC CARCINOMA IDENTIFIED. (RAH) Specimen Gross and Clinical Information Specimen(s) Obtained: 1. Lymph node, sentinel, biopsy, Left axillary 2. Lymph node, sentinel, biopsy, Left axilla 3. Lymph node, sentinel, biopsy, Left axilla 4. Lymph node, sentinel, biopsy, Left axilla 5. Breast, simple mastectomy, Left Specimen Clinical Information 1. Left breast cancer (tl) Gross 1. Rapid Intraoperative Consult performed (Yes or No): Yes. Specimen: Left axillary sentinel node. Number and size: One node, 2.2 cm Cut Surface(s): Soft, fatty, focally blue. Block Summary: The node is sectioned, touch preparations are made on one slide, and the tissue is submitted in one block labeled SLN for frozen section. 2. Rapid Intraoperative Consult performed (Yes or No): Yes. Specimen: Left axillary sentinel node. Number and size: One node, 2.1 cm. Cut Surface(s): Soft, fatty, focally blue. Block Summary: The node is bisected, touch preparations are made on one slide, and is submitted in one block labeled SLN for frozen section. 3. Rapid  Intraoperative Consult performed (Yes or No): Yes. Specimen: Left axillary sentinel node. Number and size: One node, 0.7 cm Cut Surface(s): Soft, fatty, focally blue. Block Summary: The node is bisected, touch preparations are made on one slide, and is submitted in one block labeled SLN for frozen section. 4. Rapid Intraoperative Consult performed (Yes or No): Yes. Specimen: Left axillary sentinel node. Number and size: One node, 1.5 cm. 3 of 4 FINAL for Harkless, Aliza M ([id number]) Gross(continued) Cut Surface(s): Soft, fatty, non blue. Block Summary: The node is bisected, touch preparations are made on one slide and is submitted in one block labeled SLN for frozen section. (SW:gt, 12/20/11) 5. Specimen: Left simple mastectomy, received fresh. The specimen was placed in formalin at 2:50 p.m. on 12/19/11. Specimen integrity (intact/disrupted): Intact. Weight: 1185 grams. Size: 28 x 24.5 x 7 cm. Skin: There is an attached 16 x 7.5 cm ellipse of skin with a central, unremarkable nipple. There is a suture attached at the superior edge. Tumor/cavity: In the mid central to mid lateral breast there is an ill defined area of palpable nodular fibrous tissue without a discrete mass identified. This area measures approximately 7 x 6 x 3 cm and extends to within 1.5 cm of the deep margin. Within this area there is a 1.5 cm cavitary defect, consistent with a previous biopsy site. There is a silver metallic biopsy clip present at the defect. Uninvolved parenchyma: The remainder of the breast tissue consists of fat and dense white fibrous tissue. Prognostic indicators: Obtain from paraffin blocks if needed. Lymph nodes: There are no lymph nodes identified. Block summary: 13 blocks submitted. A = deep margin at nodular area. B - I = sections of nodular area, sequentially labeled from lateral through medial, margins not included. J = lower medial. K = upper medial. L = lower lateral. M = upper lateral.  (GP:eps 12/20/11)  RADIOGRAPHIC STUDIES:  Nm Sentinel Node Inj-no Rpt (breast)  12/19/2011  CLINICAL DATA: breast cancer   Sulfur colloid was injected intradermally by the nuclear medicine  technologist for breast cancer sentinel node localization.      ASSESSMENT: 51 year old female with  #1 stage II invasive ductal carcinoma of the left breast with ductal carcinoma in situ. Patient is status post mastectomy that showed 2 foci of invasive cancer measuring 0.8 0.6 cm. The tumor is ER positive PR positive HER-2/neu amplified at 3.27. Patient is now seen in medical oncology for discussion of adjuvant treatment. As mentioned above her case was discussed at the multidisciplinary breast conference. Patient is noted to have one of 4 lymph nodes positive for metastatic disease. This was only axillary sentinel node biopsy. A discussion of whether patient under should undergo a full axillary lymph node dissection did take place but at the end of our extensive discussion it was recommended the patient will proceed with radiation and DOS she would feel the criteria of seed 11. Therefore is excellent lymph node dissection is not being done. Instead the patient will proceed with adjuvant chemotherapy and Herceptin. Her chemotherapy will consist of Taxotere carboplatinum given every 21 days. For the duration of her chemotherapy patient will get Herceptin on a weekly basis. Once she completes the chemotherapy she then will get Herceptin every 3 weeks. After completion of chemotherapy patient will proceed with radiation therapy with concurrent Herceptin. After completion of radiation she will start Herceptin and antiestrogen therapy consisting of either tamoxifen or an aromatase inhibitor.  #2 is continuing radiation therapy with Herceptin being given every 3 weeks.  #3 Port has been replaced.  PLAN:  #1 proceed with Herceptin today.  She's tolerating radiation well.   #2 Port has been replaced and she tolerated it  well.  Insertion site is clean dry and intact.   #3 she will be seen back in 3 weeks' time in followup.  All questions were answered. The patient knows to call the clinic with any problems, questions or concerns. We can certainly see the patient much sooner if necessary.  I spent 15 minutes counseling the patient face to face. The total time spent in the appointment was 30 minutes.  This case was reviewed with Dr. Welton Flakes.    Cherie Ouch Lyn Hollingshead, NP Medical Oncology [facility] Phone: [phone]   05/11/2012, 1:04 PM

## 2012-05-11 NOTE — Patient Instructions (Signed)
[facility] Discharge Instructions for Patients Receiving Chemotherapy  Today you received the following chemotherapy agents Herceptin To help prevent nausea and vomiting after your treatment, we encourage you to take your nausea medication as prescribed.  If you develop nausea and vomiting that is not controlled by your nausea medication, call the clinic. If it is after clinic hours your family physician or the after hours number for the clinic or go to the Emergency Department.   BELOW ARE SYMPTOMS THAT SHOULD BE REPORTED IMMEDIATELY:  *FEVER GREATER THAN 100.5 F  *CHILLS WITH OR WITHOUT FEVER  NAUSEA AND VOMITING THAT IS NOT CONTROLLED WITH YOUR NAUSEA MEDICATION  *UNUSUAL SHORTNESS OF BREATH  *UNUSUAL BRUISING OR BLEEDING  TENDERNESS IN MOUTH AND THROAT WITH OR WITHOUT PRESENCE OF ULCERS  *URINARY PROBLEMS  *BOWEL PROBLEMS  UNUSUAL RASH Items with * indicate a potential emergency and should be followed up as soon as possible.  One of the nurses will contact you 24 hours after your treatment. Please let the nurse know about any problems that you may have experienced. Feel free to call the clinic you have any questions or concerns. The clinic phone number is [phone].   I have been informed and understand all the instructions given to me. I know to contact the clinic, my physician, or go to the Emergency Department if any problems should occur. I do not have any questions at this time, but understand that I may call the clinic during office hours   should I have any questions or need assistance in obtaining follow up care.    __________________________________________  _____________  __________ Signature of Patient or Authorized Representative            Date                   Time    __________________________________________ Nurse's Signature    

## 2012-05-11 NOTE — Patient Instructions (Addendum)
Doing well.  Proceed with Herceptin.  We will see you back in 3 weeks for your next treatment.

## 2012-05-12 ENCOUNTER — Ambulatory Visit
Admit: 2012-05-12 | Discharge: 2012-05-12 | Disposition: A | Discharge status: Home / Self Care | Payer: BC Managed Care – PPO | Attending: Radiation Oncology | Admitting: Radiation Oncology

## 2012-05-14 ENCOUNTER — Ambulatory Visit
Admit: 2012-05-14 | Discharge: 2012-05-14 | Disposition: A | Discharge status: Home / Self Care | Payer: BC Managed Care – PPO | Attending: Radiation Oncology | Admitting: Radiation Oncology

## 2012-05-14 ENCOUNTER — Ambulatory Visit: Payer: BC Managed Care – PPO

## 2012-05-15 ENCOUNTER — Ambulatory Visit
Admit: 2012-05-15 | Discharge: 2012-05-15 | Disposition: A | Discharge status: Home / Self Care | Payer: BC Managed Care – PPO | Attending: Radiation Oncology | Admitting: Radiation Oncology

## 2012-05-15 ENCOUNTER — Ambulatory Visit: Payer: BC Managed Care – PPO

## 2012-05-15 ENCOUNTER — Ambulatory Visit
Admission: RE | Admit: 2012-05-15 | Discharge: 2012-05-15 | Disposition: A | Discharge status: Home / Self Care | Payer: BC Managed Care – PPO | Source: Ambulatory Visit | Attending: Radiation Oncology | Admitting: Radiation Oncology

## 2012-05-15 ENCOUNTER — Encounter: Payer: Self-pay | Admitting: Radiation Oncology

## 2012-05-15 VITALS — BP 124/55 | HR 62 | Temp 97.5°F | Resp 20 | Wt 188.3 lb

## 2012-05-15 DIAGNOSIS — C50419 Malignant neoplasm of upper-outer quadrant of unspecified female breast: Secondary | ICD-10-CM

## 2012-05-15 MED ORDER — BIAFINE EX EMUL
Freq: Two times a day (BID) | CUTANEOUS | Status: DC
  Administered 2012-05-15: 15:00:00 via TOPICAL

## 2012-05-15 NOTE — Progress Notes (Signed)
[facility]    Radiation Oncology [address] [place]     Maryln Gottron, M.D. [place], [place] [address]               Billie Lade, M.D., Ph.D. Phone: [phone]      Molli Hazard A. Kathrynn Running, M.D. Fax: [phone]      Radene Gunning, M.D., Ph.D.         Lurline Hare, M.D.         Grayland Jack, M.D Weekly Treatment Management Note  Name: Julie Ross     MRN: [id number]        CSN: [id number] Date: 05/15/2012      DOB: 1960-10-02  CC: [name]    Status: Outpatient  Diagnosis: The encounter diagnosis was Cancer of upper-outer quadrant of female breast.  Current Dose: 3780 cGy  Current Fraction: 21  Planned Dose: 5940 cGy  Narrative: [name] was seen today for weekly treatment management. The chart was checked and port films  were reviewed. She is having itching and discomfort in the treatment area at this time. She was given Biafine as well as a hydrogel dressing to use. I've also recommended she use hydrocortisone cream if the itching gets out of hand at night.  Review of patient's allergies indicates no known allergies.  Current Outpatient Prescriptions  Medication Sig Dispense Refill  . acetaminophen (TYLENOL) 325 MG tablet Take 1-2 tablets (325-650 mg total) by mouth every 4 (four) hours as needed.      . ALPRAZolam (XANAX) 0.5 MG tablet Take 1 tablet (0.5 mg total) by mouth 3 (three) times daily as needed for anxiety.  15 tablet  0  . atenolol (TENORMIN) 25 MG tablet Take 0.5 tablets (12.5 mg total) by mouth daily. For blood pressure  15 tablet  0  . bacitracin ointment Apply topically 2 (two) times daily.  120 g    . CALCIUM-MAGNESIUM PO Take 1 tablet by mouth daily.      . cephALEXin (KEFLEX) 500 MG capsule Take 1 capsule (500 mg total) by mouth 4 (four) times daily.  120 capsule  4  . clonazePAM (KLONOPIN) 0.5 MG tablet Take 0.5 mg by mouth Nightly.      . docusate sodium (COLACE) 100 MG capsule Take 100 mg by mouth  daily.      . DULoxetine (CYMBALTA) 60 MG capsule Take 60 mg by mouth.      . esomeprazole (NEXIUM) 40 MG capsule Take 40 mg by mouth daily before breakfast.      . gabapentin (NEURONTIN) 100 MG capsule Take 1 capsule (100 mg total) by mouth 3 (three) times daily. For neuropathy  90 capsule  1  . hydrochlorothiazide (HYDRODIURIL) 25 MG tablet Take 1 tablet (25 mg total) by mouth daily. For blood pressure.  30 tablet  1  . HYDROcodone-acetaminophen (NORCO) 10-325 MG per tablet every 8 (eight) hours as needed.       . hydrOXYzine (ATARAX/VISTARIL) 25 MG tablet Take 1 tablet (25 mg total) by mouth 3 (three) times daily as needed for itching.  90 tablet  1  . IRON PO Take 45 mg by mouth every other day.       . lidocaine-prilocaine (EMLA) cream APPLY AS NEEDED  30 g  0  . losartan (COZAAR) 100 MG tablet Take 1 tablet (100 mg total) by mouth daily. For blood pressure  30 tablet  1  . losartan-hydrochlorothiazide (HYZAAR) 100-25 MG per tablet       . Multiple Vitamins-Minerals (MULTIVITAMINS THER. W/MINERALS) TABS Take 1 tablet by mouth daily.      Marland Kitchen oxyCODONE (OXYCONTIN) 20 MG 12 hr tablet Take 1 tablet (20 mg total) by mouth every 12 (twelve) hours.  20 tablet  0  . Probiotic Product (PROBIOTIC DAILY PO) Take by mouth.      . prochlorperazine (COMPAZINE) 10 MG tablet TAKE 1 TABLET BY MOUTH EVERY 6 HOURS AS NEEDED FOR NAUSEA AND VOMITING  30 tablet  2   No current facility-administered medications for this encounter.   Facility-Administered Medications Ordered in Other Encounters  Medication Dose Route Frequency Provider Last Rate Last Dose  . ceFAZolin (ANCEF) IVPB 2 g/50 mL premix    PRN Lance Coon, CRNA   2 g at 05/01/12 0855  . dexamethasone (DECADRON) injection    PRN Lance Coon, CRNA   8 mg at 05/01/12 0908  . fentaNYL (SUBLIMAZE) injection    PRN Lance Coon, CRNA   100 mcg at 05/01/12 0856  . lidocaine (cardiac) 100 mg/59ml (XYLOCAINE) 20 MG/ML injection 2%    PRN Lance Coon,  CRNA   60 mg at 05/01/12 0856  . midazolam (VERSED) 5 MG/5ML injection    PRN Lance Coon, CRNA   2 mg at 05/01/12 0856  . propofol (DIPRIVAN) 10 mg/mL bolus/IV push    PRN Lance Coon, CRNA   160 mg at 05/01/12 0900   Labs:  Lab Results  Component Value Date   WBC 7.7 05/11/2012   HGB 10.1* 05/11/2012   HCT 30.3* 05/11/2012   MCV 83.5 05/11/2012   PLT 253 05/11/2012   Lab Results  Component Value Date   CREATININE 1.3* 05/11/2012   BUN 23.0 05/11/2012   NA 132* 05/11/2012   K 4.1 05/11/2012   CL 98 05/11/2012   CO2 28 05/11/2012   Lab Results  Component Value Date   ALT 8 05/11/2012   AST 14 05/11/2012   PHOS 4.6 03/16/2012   BILITOT 0.44 05/11/2012    Physical Examination:  weight is 188 lb 4.8 oz (85.412 kg). Her oral temperature is 97.5 F (36.4 C). Her blood pressure is 124/55 and her pulse is 62. Her respiration is 20.    Wt Readings from Last 3 Encounters:  05/15/12 188 lb 4.8 oz (85.412 kg)  05/11/12 190 lb 14.4 oz (86.592 kg)  05/08/12 195 lb 9.6 oz (88.724 kg)    Skin of the left chest and left neck region shows radiation dermatitis without any moist desquamation Lungs - Normal respiratory effort, chest expands symmetrically. Lungs are clear to auscultation, no crackles or wheezes.  Heart has regular rhythm and rate  Abdomen is soft and non tender with normal bowel sounds  Assessment:  Patient is having side effects as above with her treatments.  The above interventions should help with her symptoms.  Plan: Continue treatment per original radiation prescription

## 2012-05-15 NOTE — Progress Notes (Signed)
Gave pt Biafine and Hydrogel pads for c/o itching, burning irritation of skin in tx area left chest wall. Instructed her on use of Hydrogel pads. She has fatigue which is chronic due to her MS. Pt reports loss of appetite.

## 2012-05-15 NOTE — Addendum Note (Signed)
Encounter addended by: Glennie Hawk, RN on: 05/15/2012  2:40 PM<BR>     Documentation filed: Orders, Inpatient MAR

## 2012-05-16 ENCOUNTER — Encounter: Payer: Self-pay | Admitting: Infectious Diseases

## 2012-05-16 ENCOUNTER — Ambulatory Visit
Admission: RE | Admit: 2012-05-16 | Discharge: 2012-05-16 | Disposition: A | Discharge status: Home / Self Care | Payer: BC Managed Care – PPO | Source: Ambulatory Visit | Attending: Radiation Oncology | Admitting: Radiation Oncology

## 2012-05-16 ENCOUNTER — Ambulatory Visit: Payer: BC Managed Care – PPO

## 2012-05-18 ENCOUNTER — Other Ambulatory Visit: Payer: BC Managed Care – PPO | Admitting: Lab

## 2012-05-18 ENCOUNTER — Ambulatory Visit: Payer: BC Managed Care – PPO

## 2012-05-18 ENCOUNTER — Ambulatory Visit: Payer: BC Managed Care – PPO | Admitting: Adult Health

## 2012-05-21 ENCOUNTER — Ambulatory Visit: Payer: BC Managed Care – PPO

## 2012-05-21 ENCOUNTER — Ambulatory Visit: Payer: BC Managed Care – PPO | Admitting: Adult Health

## 2012-05-21 ENCOUNTER — Ambulatory Visit
Admission: RE | Admit: 2012-05-21 | Discharge: 2012-05-21 | Disposition: A | Discharge status: Home / Self Care | Payer: BC Managed Care – PPO | Source: Ambulatory Visit | Attending: Radiation Oncology | Admitting: Radiation Oncology

## 2012-05-22 ENCOUNTER — Encounter (INDEPENDENT_AMBULATORY_CARE_PROVIDER_SITE_OTHER): Payer: BC Managed Care – PPO | Admitting: Surgery

## 2012-05-22 ENCOUNTER — Ambulatory Visit
Admission: RE | Admit: 2012-05-22 | Discharge: 2012-05-22 | Disposition: A | Discharge status: Home / Self Care | Payer: BC Managed Care – PPO | Source: Ambulatory Visit | Attending: Radiation Oncology | Admitting: Radiation Oncology

## 2012-05-22 ENCOUNTER — Ambulatory Visit: Payer: BC Managed Care – PPO

## 2012-05-22 VITALS — BP 153/58 | HR 53 | Temp 98.1°F | Wt 191.3 lb

## 2012-05-22 DIAGNOSIS — C50419 Malignant neoplasm of upper-outer quadrant of unspecified female breast: Secondary | ICD-10-CM

## 2012-05-22 NOTE — Progress Notes (Signed)
[facility]    Radiation Oncology [address] [place]     Maryln Gottron, M.D. [place], [place] [address]               Billie Lade, M.D., Ph.D. Phone: [phone]      Molli Hazard A. Kathrynn Running, M.D. Fax: [phone]      Radene Gunning, M.D., Ph.D.         Lurline Hare, M.D.         Grayland Jack, M.D Weekly Treatment Management Note  Name: Julie Ross     MRN: [id number]        CSN: [id number] Date: 05/22/2012      DOB: [birth year]  CC: Julie Ross    Status: Outpatient  Diagnosis: The encounter diagnosis was Cancer of upper-outer quadrant of female breast.  Current Dose: 4320 cGy  Current Fraction: 24  Planned Dose: 5940 cGy  Narrative: Julie Ross was seen today for weekly treatment management. The chart was checked and port films  were reviewed. She seems to be having less itching and discomfort in the treatment area.  much of her skin reaction is outside anticipated mastectomy scar boost which will begin on Thursday of this week.  Review of patient's allergies indicates no known allergies.  Current Outpatient Prescriptions  Medication Sig Dispense Refill  . acetaminophen (TYLENOL) 325 MG tablet Take 1-2 tablets (325-650 mg total) by mouth every 4 (four) hours as needed.      . ALPRAZolam (XANAX) 0.5 MG tablet Take 1 tablet (0.5 mg total) by mouth 3 (three) times daily as needed for anxiety.  15 tablet  0  . atenolol (TENORMIN) 25 MG tablet Take 0.5 tablets (12.5 mg total) by mouth daily. For blood pressure  15 tablet  0  . bacitracin ointment Apply topically 2 (two) times daily.  120 g    . CALCIUM-MAGNESIUM PO Take 1 tablet by mouth daily.      . cephALEXin (KEFLEX) 500 MG capsule Take 1 capsule (500 mg total) by mouth 4 (four) times daily.  120 capsule  4  . clonazePAM (KLONOPIN) 0.5 MG tablet Take 0.5 mg by mouth Nightly.      . docusate sodium (COLACE) 100 MG capsule Take 100 mg by mouth daily.      . DULoxetine (CYMBALTA)  60 MG capsule Take 60 mg by mouth.      . emollient (BIAFINE) cream Apply topically 2 (two) times daily.      Marland Kitchen esomeprazole (NEXIUM) 40 MG capsule Take 40 mg by mouth daily before breakfast.      . gabapentin (NEURONTIN) 100 MG capsule Take 1 capsule (100 mg total) by mouth 3 (three) times daily. For neuropathy  90 capsule  1  . hydrochlorothiazide (HYDRODIURIL) 25 MG tablet Take 1 tablet (25 mg total) by mouth daily. For blood pressure.  30 tablet  1  . HYDROcodone-acetaminophen (NORCO) 10-325 MG per tablet every 8 (eight) hours as needed.       . hydrOXYzine (ATARAX/VISTARIL) 25 MG tablet Take 1 tablet (25 mg total) by mouth 3 (three) times daily as needed for itching.  90 tablet  1  . IRON PO Take 45 mg by mouth every other day.       . lidocaine-prilocaine (EMLA) cream APPLY AS NEEDED  30 g  0  . losartan (COZAAR) 100 MG tablet Take 1 tablet (100 mg total) by mouth daily.  For blood pressure  30 tablet  1  . losartan-hydrochlorothiazide (HYZAAR) 100-25 MG per tablet       . Multiple Vitamins-Minerals (MULTIVITAMINS THER. W/MINERALS) TABS Take 1 tablet by mouth daily.      Marland Kitchen oxyCODONE (OXYCONTIN) 20 MG 12 hr tablet Take 1 tablet (20 mg total) by mouth every 12 (twelve) hours.  20 tablet  0  . Probiotic Product (PROBIOTIC DAILY PO) Take by mouth.      . prochlorperazine (COMPAZINE) 10 MG tablet TAKE 1 TABLET BY MOUTH EVERY 6 HOURS AS NEEDED FOR NAUSEA AND VOMITING  30 tablet  2   No current facility-administered medications for this encounter.   Facility-Administered Medications Ordered in Other Encounters  Medication Dose Route Frequency Provider Last Rate Last Dose  . ceFAZolin (ANCEF) IVPB 2 g/50 mL premix    PRN Lance Coon, CRNA   2 g at 05/01/12 0855  . dexamethasone (DECADRON) injection    PRN Lance Coon, CRNA   8 mg at 05/01/12 0908  . fentaNYL (SUBLIMAZE) injection    PRN Lance Coon, CRNA   100 mcg at 05/01/12 0856  . lidocaine (cardiac) 100 mg/80ml (XYLOCAINE) 20 MG/ML  injection 2%    PRN Lance Coon, CRNA   60 mg at 05/01/12 0856  . midazolam (VERSED) 5 MG/5ML injection    PRN Lance Coon, CRNA   2 mg at 05/01/12 0856  . propofol (DIPRIVAN) 10 mg/mL bolus/IV push    PRN Lance Coon, CRNA   160 mg at 05/01/12 0900   Labs:  Lab Results  Component Value Date   WBC 7.7 05/11/2012   HGB 10.1* 05/11/2012   HCT 30.3* 05/11/2012   MCV 83.5 05/11/2012   PLT 253 05/11/2012   Lab Results  Component Value Date   CREATININE 1.3* 05/11/2012   BUN 23.0 05/11/2012   NA 132* 05/11/2012   K 4.1 05/11/2012   CL 98 05/11/2012   CO2 28 05/11/2012   Lab Results  Component Value Date   ALT 8 05/11/2012   AST 14 05/11/2012   PHOS 4.6 03/16/2012   BILITOT 0.44 05/11/2012    Physical Examination:  weight is 191 lb 4.8 oz (86.773 kg). Her temperature is 98.1 F (36.7 C). Her blood pressure is 153/58 and her pulse is 53.    Wt Readings from Last 3 Encounters:  05/22/12 191 lb 4.8 oz (86.773 kg)  05/15/12 188 lb 4.8 oz (85.412 kg)  05/11/12 190 lb 14.4 oz (86.592 kg)    Radiation dermatitis noted in the supraclavicular and upper inner aspect of the chest wall. There is no moist desquamation appreciated. Lungs - Normal respiratory effort, chest expands symmetrically. Lungs are clear to auscultation, no crackles or wheezes.  Heart has regular rhythm and rate  Abdomen is soft and non tender with normal bowel sounds  Assessment:  Patient tolerating treatments well except for issues as above  Plan: Continue treatment per original radiation prescription

## 2012-05-22 NOTE — Progress Notes (Signed)
Patient here for weekly assessment of left chest wall radiation.Has follicular rash and redness.Denies pain.Generalized fatigue.

## 2012-05-23 ENCOUNTER — Other Ambulatory Visit: Payer: Self-pay | Admitting: Adult Health

## 2012-05-23 ENCOUNTER — Ambulatory Visit: Payer: BC Managed Care – PPO

## 2012-05-23 ENCOUNTER — Ambulatory Visit
Admission: RE | Admit: 2012-05-23 | Discharge: 2012-05-23 | Disposition: A | Discharge status: Home / Self Care | Payer: BC Managed Care – PPO | Source: Ambulatory Visit | Attending: Radiation Oncology | Admitting: Radiation Oncology

## 2012-05-24 ENCOUNTER — Ambulatory Visit: Payer: BC Managed Care – PPO | Admitting: Internal Medicine

## 2012-05-24 ENCOUNTER — Other Ambulatory Visit: Payer: Self-pay | Admitting: Adult Health

## 2012-05-24 ENCOUNTER — Ambulatory Visit
Admission: RE | Admit: 2012-05-24 | Discharge: 2012-05-24 | Disposition: A | Discharge status: Home / Self Care | Payer: BC Managed Care – PPO | Source: Ambulatory Visit | Attending: Radiation Oncology | Admitting: Radiation Oncology

## 2012-05-24 ENCOUNTER — Ambulatory Visit: Payer: BC Managed Care – PPO

## 2012-05-24 ENCOUNTER — Telehealth: Payer: Self-pay | Admitting: *Deleted

## 2012-05-24 NOTE — Telephone Encounter (Signed)
Please change appt time from 3:15, to before her radiation and lab appt, 12/13  Left voice message to inform the patient of the new date and time on 06-01-2012

## 2012-05-24 NOTE — Telephone Encounter (Signed)
Per staff message and POF I have scheduled appt.  JMW  

## 2012-05-25 ENCOUNTER — Ambulatory Visit: Payer: BC Managed Care – PPO

## 2012-05-25 ENCOUNTER — Encounter (INDEPENDENT_AMBULATORY_CARE_PROVIDER_SITE_OTHER): Payer: Self-pay | Admitting: Surgery

## 2012-05-25 ENCOUNTER — Other Ambulatory Visit: Payer: BC Managed Care – PPO | Admitting: Lab

## 2012-05-25 ENCOUNTER — Ambulatory Visit (INDEPENDENT_AMBULATORY_CARE_PROVIDER_SITE_OTHER): Payer: BC Managed Care – PPO | Admitting: Surgery

## 2012-05-25 ENCOUNTER — Ambulatory Visit
Admission: RE | Admit: 2012-05-25 | Discharge: 2012-05-25 | Disposition: A | Discharge status: Home / Self Care | Payer: BC Managed Care – PPO | Source: Ambulatory Visit | Attending: Radiation Oncology | Admitting: Radiation Oncology

## 2012-05-25 VITALS — BP 112/70 | HR 58 | Resp 14 | Ht 66.0 in | Wt 189.0 lb

## 2012-05-25 DIAGNOSIS — Z09 Encounter for follow-up examination after completed treatment for conditions other than malignant neoplasm: Secondary | ICD-10-CM

## 2012-05-25 NOTE — Patient Instructions (Signed)
See me again next month

## 2012-05-25 NOTE — Progress Notes (Signed)
Chief complaint: Postop Port-A-Cath placement; status post left mastectomy now undergoing radiation therapy  History of present illness: She had a Port-A-Cath placed a few weeks ago via the right internal jugular. At the time of surgery she had injury into the subclavian artery when we attempted to enter the subclavian vein. There was no problems have arisen from that. She overall feels like she is doing okay. She has one more week of radiation therapy. She is continuing monthly Herceptin.  Exam: Vital signs:BP 112/70  Pulse 58  Resp 14  Ht 5\' 6"  (1.676 m)  Wt 189 lb (85.73 kg)  BMI 30.51 kg/m2 General: Patient alert oriented appears somewhat fatigue Surgical site: The port site is clean and appears to be healing nicely Left breast: She is acute radiation changes at the mastectomy site. There is also a residual seroma which has been chronic and we are simply following.  Impression: Stable status post Port-A-Cath placement and mastectomy  Plan: I will see her in about a month to recheck seroma.

## 2012-05-28 ENCOUNTER — Ambulatory Visit
Admission: RE | Admit: 2012-05-28 | Discharge: 2012-05-28 | Disposition: A | Discharge status: Home / Self Care | Payer: BC Managed Care – PPO | Source: Ambulatory Visit | Attending: Radiation Oncology | Admitting: Radiation Oncology

## 2012-05-28 ENCOUNTER — Ambulatory Visit: Payer: BC Managed Care – PPO

## 2012-05-29 ENCOUNTER — Ambulatory Visit
Admission: RE | Admit: 2012-05-29 | Discharge: 2012-05-29 | Disposition: A | Discharge status: Home / Self Care | Payer: BC Managed Care – PPO | Source: Ambulatory Visit | Attending: Radiation Oncology | Admitting: Radiation Oncology

## 2012-05-29 ENCOUNTER — Ambulatory Visit: Payer: BC Managed Care – PPO

## 2012-05-30 ENCOUNTER — Ambulatory Visit: Payer: BC Managed Care – PPO

## 2012-05-30 ENCOUNTER — Ambulatory Visit
Admission: RE | Admit: 2012-05-30 | Discharge: 2012-05-30 | Disposition: A | Discharge status: Home / Self Care | Payer: BC Managed Care – PPO | Source: Ambulatory Visit | Attending: Radiation Oncology | Admitting: Radiation Oncology

## 2012-05-31 ENCOUNTER — Ambulatory Visit: Payer: BC Managed Care – PPO

## 2012-05-31 ENCOUNTER — Ambulatory Visit
Admission: RE | Admit: 2012-05-31 | Discharge: 2012-05-31 | Disposition: A | Discharge status: Home / Self Care | Payer: BC Managed Care – PPO | Source: Ambulatory Visit | Attending: Radiation Oncology | Admitting: Radiation Oncology

## 2012-05-31 VITALS — BP 126/58 | HR 52 | Temp 98.2°F | Wt 191.6 lb

## 2012-05-31 DIAGNOSIS — C50419 Malignant neoplasm of upper-outer quadrant of unspecified female breast: Secondary | ICD-10-CM

## 2012-05-31 NOTE — Progress Notes (Signed)
[facility]    Radiation Oncology [address] [place]     Maryln Gottron, M.D. [place], [place] [address]               Billie Lade, M.D., Ph.D. Phone: [phone]      Molli Hazard A. Kathrynn Running, M.D. Fax: [phone]      Radene Gunning, M.D., Ph.D.         Lurline Hare, M.D.         Grayland Jack, M.D Weekly Treatment Management Note  Name: Julie Ross     MRN: [id number]        CSN: [id number] Date: 05/31/2012      DOB: Sep 19, 1960  CC: Julie Ross    Status: Outpatient  Diagnosis: The encounter diagnosis was Cancer of upper-outer quadrant of female breast.  Current Dose: 5580 cGy  Current Fraction: 31  Planned Dose: 5940 cGy  Narrative: Julie Ross was seen today for weekly treatment management. The chart was checked and port films  were reviewed. She is having fatique, mild pruritis.  Review of patient's allergies indicates no known allergies.  Current Outpatient Prescriptions  Medication Sig Dispense Refill  . ALPRAZolam (XANAX) 0.5 MG tablet Take 1 tablet (0.5 mg total) by mouth 3 (three) times daily as needed for anxiety.  15 tablet  0  . atenolol (TENORMIN) 25 MG tablet Take 0.5 tablets (12.5 mg total) by mouth daily. For blood pressure  15 tablet  0  . bacitracin ointment Apply topically 2 (two) times daily.  120 g    . CALCIUM-MAGNESIUM PO Take 1 tablet by mouth daily.      . cephALEXin (KEFLEX) 500 MG capsule Take 1 capsule (500 mg total) by mouth 4 (four) times daily.  120 capsule  4  . clonazePAM (KLONOPIN) 0.5 MG tablet Take 0.5 mg by mouth Nightly.      . docusate sodium (COLACE) 100 MG capsule Take 100 mg by mouth daily.      . DULoxetine (CYMBALTA) 60 MG capsule Take 60 mg by mouth.      . emollient (BIAFINE) cream Apply topically 2 (two) times daily.      Marland Kitchen esomeprazole (NEXIUM) 40 MG capsule Take 40 mg by mouth daily before breakfast.      . gabapentin (NEURONTIN) 100 MG capsule Take 1 capsule (100 mg total) by  mouth 3 (three) times daily. For neuropathy  90 capsule  1  . HYDROcodone-acetaminophen (NORCO) 10-325 MG per tablet every 8 (eight) hours as needed.       . IRON PO Take 45 mg by mouth every other day.       . lidocaine-prilocaine (EMLA) cream APPLY AS NEEDED  30 g  0  . losartan-hydrochlorothiazide (HYZAAR) 100-25 MG per tablet       . Multiple Vitamins-Minerals (MULTIVITAMINS THER. W/MINERALS) TABS Take 1 tablet by mouth daily.      . Probiotic Product (PROBIOTIC DAILY PO) Take by mouth.      . prochlorperazine (COMPAZINE) 10 MG tablet TAKE 1 TABLET BY MOUTH EVERY 6 HOURS AS NEEDED FOR NAUSEA AND VOMITING  30 tablet  2   No current facility-administered medications for this encounter.   Facility-Administered Medications Ordered in Other Encounters  Medication Dose Route Frequency Provider Last Rate Last Dose  . ceFAZolin (ANCEF) IVPB 2 g/50 mL premix    PRN Lance Coon, CRNA   2 g at 05/01/12  1914  . dexamethasone (DECADRON) injection    PRN Lance Coon, CRNA   8 mg at 05/01/12 0908  . fentaNYL (SUBLIMAZE) injection    PRN Lance Coon, CRNA   100 mcg at 05/01/12 0856  . lidocaine (cardiac) 100 mg/29ml (XYLOCAINE) 20 MG/ML injection 2%    PRN Lance Coon, CRNA   60 mg at 05/01/12 0856  . midazolam (VERSED) 5 MG/5ML injection    PRN Lance Coon, CRNA   2 mg at 05/01/12 0856  . propofol (DIPRIVAN) 10 mg/mL bolus/IV push    PRN Lance Coon, CRNA   160 mg at 05/01/12 0900   Labs:  Lab Results  Component Value Date   WBC 7.7 05/11/2012   HGB 10.1* 05/11/2012   HCT 30.3* 05/11/2012   MCV 83.5 05/11/2012   PLT 253 05/11/2012   Lab Results  Component Value Date   CREATININE 1.3* 05/11/2012   BUN 23.0 05/11/2012   NA 132* 05/11/2012   K 4.1 05/11/2012   CL 98 05/11/2012   CO2 28 05/11/2012   Lab Results  Component Value Date   ALT 8 05/11/2012   AST 14 05/11/2012   PHOS 4.6 03/16/2012   BILITOT 0.44 05/11/2012    Physical Examination:  weight is 191 lb 9.6 oz  (86.909 kg). Her temperature is 98.2 F (36.8 C). Her blood pressure is 126/58 and her pulse is 52.    Wt Readings from Last 3 Encounters:  05/31/12 191 lb 9.6 oz (86.909 kg)  05/25/12 189 lb (85.73 kg)  05/22/12 191 lb 4.8 oz (86.773 kg)    The skin of the left chest wall is intact, erythema, hyperpigmented Lungs - Normal respiratory effort, chest expands symmetrically. Lungs are clear to auscultation, no crackles or wheezes.  Heart has regular rhythm and rate  Abdomen is soft and non tender with normal bowel sounds  Assessment:  Patient tolerating treatments well  Plan: Continue treatment per original radiation prescription

## 2012-05-31 NOTE — Progress Notes (Signed)
Patient here for routine weekly assessment of left chest wall radiation.Doint very well.Moderate redness.Continue application of moisturizer as ordered. Denies pain.Completes treatment on Monday.

## 2012-06-01 ENCOUNTER — Ambulatory Visit (HOSPITAL_BASED_OUTPATIENT_CLINIC_OR_DEPARTMENT_OTHER): Payer: BC Managed Care – PPO

## 2012-06-01 ENCOUNTER — Ambulatory Visit: Payer: BC Managed Care – PPO

## 2012-06-01 ENCOUNTER — Other Ambulatory Visit: Payer: BC Managed Care – PPO | Admitting: Lab

## 2012-06-01 ENCOUNTER — Other Ambulatory Visit (HOSPITAL_BASED_OUTPATIENT_CLINIC_OR_DEPARTMENT_OTHER): Payer: BC Managed Care – PPO | Admitting: Lab

## 2012-06-01 ENCOUNTER — Ambulatory Visit
Admission: RE | Admit: 2012-06-01 | Discharge: 2012-06-01 | Disposition: A | Discharge status: Home / Self Care | Payer: BC Managed Care – PPO | Source: Ambulatory Visit | Attending: Radiation Oncology | Admitting: Radiation Oncology

## 2012-06-01 ENCOUNTER — Ambulatory Visit (HOSPITAL_BASED_OUTPATIENT_CLINIC_OR_DEPARTMENT_OTHER): Payer: BC Managed Care – PPO | Admitting: Adult Health

## 2012-06-01 ENCOUNTER — Encounter: Payer: Self-pay | Admitting: Adult Health

## 2012-06-01 VITALS — BP 148/80 | HR 53 | Temp 98.3°F | Resp 20 | Ht 66.0 in | Wt 190.8 lb

## 2012-06-01 DIAGNOSIS — Z5112 Encounter for antineoplastic immunotherapy: Secondary | ICD-10-CM

## 2012-06-01 DIAGNOSIS — Z170 Estrogen receptor positive status [ER+]: Secondary | ICD-10-CM

## 2012-06-01 DIAGNOSIS — C50419 Malignant neoplasm of upper-outer quadrant of unspecified female breast: Secondary | ICD-10-CM

## 2012-06-01 DIAGNOSIS — C773 Secondary and unspecified malignant neoplasm of axilla and upper limb lymph nodes: Secondary | ICD-10-CM

## 2012-06-01 LAB — CBC WITH DIFFERENTIAL/PLATELET
BASO%: 1 % (ref 0.0–2.0)
Basophils Absolute: 0.1 10*3/uL (ref 0.0–0.1)
EOS%: 3.9 % (ref 0.0–7.0)
Eosinophils Absolute: 0.2 10*3/uL (ref 0.0–0.5)
HCT: 32.5 % — ABNORMAL LOW (ref 34.8–46.6)
HGB: 11 g/dL — ABNORMAL LOW (ref 11.6–15.9)
LYMPH%: 14.6 % (ref 14.0–49.7)
MCH: 27.8 pg (ref 25.1–34.0)
MCHC: 33.8 g/dL (ref 31.5–36.0)
MCV: 82.1 fL (ref 79.5–101.0)
MONO#: 0.3 10*3/uL (ref 0.1–0.9)
MONO%: 5.4 % (ref 0.0–14.0)
NEUT#: 4.6 10*3/uL (ref 1.5–6.5)
NEUT%: 75.1 % (ref 38.4–76.8)
Platelets: 252 10*3/uL (ref 145–400)
RBC: 3.96 10*6/uL (ref 3.70–5.45)
RDW: 13.2 % (ref 11.2–14.5)
WBC: 6.1 10*3/uL (ref 3.9–10.3)
lymph#: 0.9 10*3/uL (ref 0.9–3.3)
nRBC: 0 % (ref 0–0)

## 2012-06-01 LAB — COMPREHENSIVE METABOLIC PANEL (CC13)
ALT: 11 U/L (ref 0–55)
AST: 13 U/L (ref 5–34)
Albumin: 3.4 g/dL — ABNORMAL LOW (ref 3.5–5.0)
Alkaline Phosphatase: 147 U/L (ref 40–150)
BUN: 14 mg/dL (ref 7.0–26.0)
CO2: 27 mEq/L (ref 22–29)
Calcium: 9.2 mg/dL (ref 8.4–10.4)
Chloride: 99 mEq/L (ref 98–107)
Creatinine: 1 mg/dL (ref 0.6–1.1)
Glucose: 111 mg/dl — ABNORMAL HIGH (ref 70–99)
Potassium: 3.7 mEq/L (ref 3.5–5.1)
Sodium: 135 mEq/L — ABNORMAL LOW (ref 136–145)
Total Bilirubin: 0.52 mg/dL (ref 0.20–1.20)
Total Protein: 7.2 g/dL (ref 6.4–8.3)

## 2012-06-01 MED ORDER — TRASTUZUMAB CHEMO INJECTION 440 MG
6.0000 mg/kg | Freq: Once | INTRAVENOUS | Status: AC
  Administered 2012-06-01: 525 mg via INTRAVENOUS
  Filled 2012-06-01: qty 25

## 2012-06-01 MED ORDER — DIPHENHYDRAMINE HCL 25 MG PO CAPS
50.0000 mg | ORAL_CAPSULE | Freq: Once | ORAL | Status: AC
  Administered 2012-06-01: 50 mg via ORAL

## 2012-06-01 MED ORDER — SODIUM CHLORIDE 0.9 % IJ SOLN
10.0000 mL | INTRAMUSCULAR | Status: DC | PRN
  Administered 2012-06-01: 10 mL
  Filled 2012-06-01: qty 10

## 2012-06-01 MED ORDER — SODIUM CHLORIDE 0.9 % IV SOLN
Freq: Once | INTRAVENOUS | Status: AC
  Administered 2012-06-01: 15:00:00 via INTRAVENOUS

## 2012-06-01 MED ORDER — HEPARIN SOD (PORK) LOCK FLUSH 100 UNIT/ML IV SOLN
500.0000 [IU] | Freq: Once | INTRAVENOUS | Status: AC | PRN
  Administered 2012-06-01: 500 [IU]
  Filled 2012-06-01: qty 5

## 2012-06-01 MED ORDER — ACETAMINOPHEN 325 MG PO TABS
650.0000 mg | ORAL_TABLET | Freq: Once | ORAL | Status: AC
  Administered 2012-06-01: 650 mg via ORAL

## 2012-06-01 NOTE — Progress Notes (Signed)
OFFICE PROGRESS NOTE  CC  [name] [address] [place] [address] Dr. Cyndia Bent Dr. Richardean Chimera Dr. Mardella Layman Dr. Gustavus Messing ([facility])  DIAGNOSIS: 51 year old female who originally was seen in the multidisciplinary breast clinic for discussion of new diagnosis of left breast cancer. She is now status post left mastectomy with the final pathology revealing an invasive ductal carcinoma that is ER positive PR positive HER-2/neu positive with Ki-67 11%.  PRIOR THERAPY:  #1 patient was originally seen in the multidisciplinary breast clinic on 11/30/2011 for what at that time looked like a ductal carcinoma in situ of the left breast. Due to the extent of disease she was recommended a mastectomy with axillary lymph node sampling.  #2 12/19/2011 patient had a left mastectomy with sentinel lymph node biopsy. Her final pathology revealed 2 foci of invasive ductal carcinoma measuring 0.8 and 0.6 cm. The tumor was low grade ER positive PR positive. Patient's tumor was found to be HER-2/neu positive with HER-2 ratio of 3.27. Ki-67 was 11%. Patient had sentinel lymph node  Biopsies performed one of 4 lymph nodes were positive for metastatic disease. Patient's final pathologic staging is T1 N1 M0. Stage II  #3 patient has received adjuvant chemotherapy consisting of Taxotere carboplatinum and Herceptin for 2 cycles. This was then complicated by development of bacteremia left knee sepsis requiring revision of the knee. Port-A-Cath site infection requiring removal of the Port-A-Cath and placement of a PICC line.  #4 patient will now receive Herceptin every 3 weeks with radiation therapy this is to continue until patient finishes up her antibiotics then we may consider her completing her 2 cycles of Taxotere and carboplatinum combination.  CURRENT THERAPY: Patient will proceed with Herceptin every 3 weeks  INTERVAL HISTORY: Julie Ross Lora 51  y.o. female returns for Followup visit. She's tolerating the Herceptin well. She continues on Keflex for her left septic arthritis.  She will follow up with Dr. Orvan Falconer in infectious disease in January 2014.  She will complete her radiation on 06/04/12.  She's now having very bad pain in her right knee from arthritis.  She's planning on getting the knee injected.  Otherwise she's doing well and w/o questions/concerns.    MEDICAL HISTORY: Past Medical History  Diagnosis Date  . Hypertension   . Hypothyroidism   . Arthritis   . Neuromuscular disorder     MS TX WITH CYMBALTA   . Multiple sclerosis   . H/O colonoscopy   . H/O bone density study 03/2011  . Wears glasses   . Heart burn   . Depression   . Sleep apnea     MILD SLEEP APNEA, NO MACHINE  6 YRS AGO HPT [facility]   . Breast cancer     lul/ER+PR+    ALLERGIES:   has no known allergies.  MEDICATIONS:  Current Outpatient Prescriptions  Medication Sig Dispense Refill  . ALPRAZolam (XANAX) 0.5 MG tablet Take 1 tablet (0.5 mg total) by mouth 3 (three) times daily as needed for anxiety.  15 tablet  0  . atenolol (TENORMIN) 25 MG tablet Take 0.5 tablets (12.5 mg total) by mouth daily. For blood pressure  15 tablet  0  . bacitracin ointment Apply topically 2 (two) times daily.  120 g    . baclofen (LIORESAL) 10 MG tablet       . CALCIUM-MAGNESIUM PO Take 1 tablet by mouth daily.      . cephALEXin (KEFLEX) 500 MG  capsule Take 1 capsule (500 mg total) by mouth 4 (four) times daily.  120 capsule  4  . clonazePAM (KLONOPIN) 0.5 MG tablet Take 0.5 mg by mouth Nightly.      . docusate sodium (COLACE) 100 MG capsule Take 100 mg by mouth daily.      . DULoxetine (CYMBALTA) 60 MG capsule Take 60 mg by mouth.      . emollient (BIAFINE) cream Apply topically 2 (two) times daily.      Marland Kitchen esomeprazole (NEXIUM) 40 MG capsule Take 40 mg by mouth daily before breakfast.      . gabapentin (NEURONTIN) 100 MG capsule Take 1 capsule (100 mg total) by  mouth 3 (three) times daily. For neuropathy  90 capsule  1  . hydrochlorothiazide (HYDRODIURIL) 25 MG tablet       . HYDROcodone-acetaminophen (NORCO) 10-325 MG per tablet every 8 (eight) hours as needed.       . IRON PO Take 45 mg by mouth every other day.       . lidocaine-prilocaine (EMLA) cream APPLY AS NEEDED  30 g  0  . losartan (COZAAR) 100 MG tablet       . Multiple Vitamins-Minerals (MULTIVITAMINS THER. W/MINERALS) TABS Take 1 tablet by mouth daily.      . Probiotic Product (PROBIOTIC DAILY PO) Take by mouth.      . prochlorperazine (COMPAZINE) 10 MG tablet TAKE 1 TABLET BY MOUTH EVERY 6 HOURS AS NEEDED FOR NAUSEA AND VOMITING  30 tablet  2  . losartan-hydrochlorothiazide (HYZAAR) 100-25 MG per tablet        No current facility-administered medications for this visit.   Facility-Administered Medications Ordered in Other Visits  Medication Dose Route Frequency Provider Last Rate Last Dose  . ceFAZolin (ANCEF) IVPB 2 g/50 mL premix    PRN Lance Coon, CRNA   2 g at 05/01/12 0855  . dexamethasone (DECADRON) injection    PRN Lance Coon, CRNA   8 mg at 05/01/12 0908  . fentaNYL (SUBLIMAZE) injection    PRN Lance Coon, CRNA   100 mcg at 05/01/12 0856  . heparin lock flush 100 unit/mL  500 Units Intracatheter Once PRN Victorino December, MD      . lidocaine (cardiac) 100 mg/79ml (XYLOCAINE) 20 MG/ML injection 2%    PRN Lance Coon, CRNA   60 mg at 05/01/12 0856  . midazolam (VERSED) 5 MG/5ML injection    PRN Lance Coon, CRNA   2 mg at 05/01/12 0856  . propofol (DIPRIVAN) 10 mg/mL bolus/IV push    PRN Lance Coon, CRNA   160 mg at 05/01/12 0900  . sodium chloride 0.9 % injection 10 mL  10 mL Intracatheter PRN Victorino December, MD      . trastuzumab (HERCEPTIN) 525 mg in sodium chloride 0.9 % 250 mL chemo infusion  6 mg/kg (Treatment Plan Actual) Intravenous Once Victorino December, MD        SURGICAL HISTORY:  Past Surgical History  Procedure Date  . Tonsillectomy   . Tumor  removal     FROM PELVIS AGE 16  . Cesarean section     X2   . No past surgeries     BLADDER SLING  4-5 YRS AGO   . Knee arthroscopy     LT KNEE 04/2011  . Total knee arthroplasty 08/15/2011    Procedure: TOTAL KNEE ARTHROPLASTY; lft Surgeon: Harvie Junior, MD;  Location: [facility] OR;  Service: Orthopedics;  Laterality: Left;  RIGHT KNEE CORTIZONE INJECTION  . Mohs surgery     right face  . Knee arthroscopy 82    rt  . Joint replacement Feb 2013  . Mastectomy w/ sentinel node biopsy 12/19/2011    Procedure: MASTECTOMY WITH SENTINEL LYMPH NODE BIOPSY;  Surgeon: Currie Paris, MD;  Location: [facility] OR;  Service: General;  Laterality: Left;  left breast and left axilla   . Portacath placement 01/16/2012    Procedure: INSERTION PORT-A-CATH;  Surgeon: Currie Paris, MD;  Location: [facility];  Service: General;  Laterality: Right;  Porta Cath Placement   . I&d knee with poly exchange 03/02/2012    Procedure: IRRIGATION AND DEBRIDEMENT KNEE WITH POLY EXCHANGE;  Surgeon: Harvie Junior, MD;  Location: [facility] OR;  Service: Orthopedics;  Laterality: Left;  I&D of total knee with Possible Poly Exchange   . Tee without cardioversion 03/06/2012    Procedure: TRANSESOPHAGEAL ECHOCARDIOGRAM (TEE);  Surgeon: Wendall Stade, MD;  Location: [facility] END[facility]COPY;  Service: Cardiovascular;  Laterality: N/A;  Rm. [id number]  . Port-a-cath removal 03/06/2012    Procedure: REMOVAL PORT-A-CATH;  Surgeon: Adolph Pollack, MD;  Location: [facility] OR;  Service: General;  Laterality: N/A;  . Portacath placement 05/01/2012    Procedure: INSERTION PORT-A-CATH;  Surgeon: Currie Paris, MD;  Location: [facility];  Service: General;  Laterality: Right;  right internal jugular port-a-cath insertion    REVIEW OF SYSTEMS:   General: fatigue (-), night sweats (-), fever (-), pain (-) Lymph: palpable nodes (-) HEENT: vision changes (-), mucositis (-), gum bleeding (-), epistaxis (-) Cardiovascular: chest pain (-),  palpitations (-) Pulmonary: shortness of breath (-), dyspnea on exertion (-), cough (-), hemoptysis (-) GI:  Early satiety (-), melena (-), dysphagia (-), nausea/vomiting (-), diarrhea (-) GU: dysuria (-), hematuria (-), incontinence (-) Musculoskeletal: joint swelling (-), joint pain (-), back pain (-) Neuro: weakness (-), numbness (-), headache (-), confusion (-) Skin: Rash (-), lesions (-), dryness (-) Psych: depression (-), suicidal/homicidal ideation (-), feeling of hopelessness (-)   PHYSICAL EXAMINATION:  BP 148/80  Pulse 53  Temp 98.3 F (36.8 C) (Oral)  Resp 20  Ht 5\' 6"  (1.676 m)  Wt 190 lb 12.8 oz (86.546 kg)  BMI 30.80 kg/m2 General: Patient is a well appearing female in no acute distress HEENT: PERRLA, sclerae anicteric no conjunctival pallor, MMM Neck: supple, no palpable adenopathy Lungs: clear to auscultation bilaterally, no wheezes, rhonchi, or rales Cardiovascular: regular rate rhythm, S1, S2, no murmurs, rubs or gallops Abdomen: Soft, non-tender, non-distended, normoactive bowel sounds, no HSM Extremities: warm and well perfused, no clubbing, cyanosis, or edema Skin: No rashes or lesions Neuro: Non-focal Breasts: left chest wall mastectomy site no nodularity, erythematous from radiation.   ECOG PERFORMANCE STATUS: 1 - Symptomatic but completely ambulatory  LABORATORY DATA: Lab Results  Component Value Date   WBC 6.1 06/01/2012   HGB 11.0* 06/01/2012   HCT 32.5* 06/01/2012   MCV 82.1 06/01/2012   PLT 252 06/01/2012      Chemistry      Component Value Date/Time   NA 132* 05/11/2012 1211   NA 135 04/30/2012 1530   K 4.1 05/11/2012 1211   K 4.0 04/30/2012 1530   CL 98 05/11/2012 1211   CL 96 04/30/2012 1530   CO2 28 05/11/2012 1211   CO2 26 04/30/2012 1530   BUN 23.0 05/11/2012 1211   BUN 20 04/30/2012 1530   CREATININE 1.3* 05/11/2012 1211   CREATININE  1.10 04/30/2012 1530      Component Value Date/Time   CALCIUM 9.6 05/11/2012 1211   CALCIUM  9.8 04/30/2012 1530   ALKPHOS 172* 05/11/2012 1211   ALKPHOS 494* 03/09/2012 0535   AST 14 05/11/2012 1211   AST 18 03/09/2012 0535   ALT 8 05/11/2012 1211   ALT 11 03/09/2012 0535   BILITOT 0.44 05/11/2012 1211   BILITOT 0.2* 03/09/2012 0535     DOB: 12-20-1960 Age: 68 Gender: F Client Name Julie Ross. [facility] Collected Date: 12/19/2011 Received Date: 12/19/2011 Physician: Cyndia Bent Chart #: MRN # : [id number] Physician cc: Richardean Chimera, MD Sherian Rein, MD Race:W Visit #: [id number] Kaylyn Lim, RN REPORT OF SURGICAL PATHOLOGY ADDITIONAL INFORMATION: 5. PROGNOSTIC INDICATORS - ACIS Results IMMUNOHISTOCHEMICAL AND MORPHOMETRIC ANALYSIS BY THE AUTOMATED CELLULAR IMAGING SYSTEM (ACIS) Estrogen Receptor (Negative, <1%): 100%, STRONG STAINING INTENSITY Progesterone Receptor (Negative, <1%): 100%, STRONG STAINING INTENSITY Proliferation Marker Ki67 by M IB-1 (Low<20%): 11% All controls stained appropriately Pecola Leisure MD Pathologist, Electronic Signature ( Signed 12/29/2011) 5. CHROMOGENIC IN-SITU HYBRIDIZATION Interpretation: HER2/NEU BY CISH - SHOWS AMPLIFICATION BY CISH ANALYSIS. THE RATIO OF HER2: CEP 17 SIGNALS WAS 3.27 Reference range: Ratio: HER2:CEP17 < 1.8 gene amplification not observed Ratio: HER2:CEP 17 1.8-2.2 - equivocal result Ratio: HER2:CEP17 > 2.2 - gene amplification observed Pecola Leisure MD Pathologist, Electronic Signature ( Signed 12/27/2011) 1 of 4 FINAL for Cinque, Sabre M ([id number]) FINAL DIAGNOSIS Diagnosis 1. Lymph node, sentinel, biopsy, Left axillary - ONE BENIGN LYMPH NODE WITH NO TUMOR SEEN (0/1). 2. Lymph node, sentinel, biopsy, Left axilla - ONE LYMPH NODE POSITIVE FOR METASTATIC DUCTAL CARCINOMA (1/1). - SEE COMMENT. 3. Lymph node, sentinel, biopsy, Left axilla - ONE BENIGN LYMPH NODE WITH NO TUMOR SEEN (0/1). 4. Lymph node, sentinel, biopsy, Left axilla - ONE BENIGN LYMPH NODE WITH NO TUMOR SEEN (0/1). 5. Breast,  simple mastectomy, Left - INVASIVE GRADE I DUCTAL CARCINOMA, TWO FOCI MEASURING 0.8 CM AND 0.6 CM ARISING IN A BACKGROUND OF EXTENSIVE INTERMEDIATE GRADE DUCTAL CARCINOMA IN SITU WITH NECROSIS. - MARGINS ARE NEGATIVE. - SEE ONCOLOGY TEMPLATE. Microscopic Comment 2. On re-review of the frozen section slides, no metastatic carcinoma is identified. The carcinoma present is on permanent sections only. 5. BREAST, INVASIVE TUMOR, WITH LYMPH NODE SAMPLING Specimen, including laterality: Left breast with sentinel lymph node. Procedure: Left simple mastectomy with sentinel lymph node biopsy. Grade: I (Both foci of tumors show similar morphology). Tubule formation: 2. Nuclear pleomorphism: 2. Mitotic: 1. Tumor size (glass slide measurement): Two foci measuring 0.8 cm and 0.6 cm. Margins: Invasive, distance to closest margin: At least 1.5 cm. In-situ, distance to closest margin: At least 1.5 cm. Lymphovascular invasion: No definite lymph/vascular invasion is not identified however a sentinel lymph node is positive (see below). Ductal carcinoma in situ: Yes. Grade: Intermediate grade. Extensive intraductal component: Yes. Lobular neoplasia: Not identified. Tumor focality: Two foci. Treatment effect: Not applicable. Extent of tumor: Tumor confined to breast parenchyma. Lymph nodes: # examined: 4. Lymph nodes with metastasis: 1. Macrometastasis: (> 2.0 mm): 1. Extracapsular extension: Not identified. Breast prognostic profile: A breast prognostic profile will be performed (see below comment). Additional breast findings: Fibrocystic changes with usual ductal hyperplasia. TNM: mpT1b, pN1a, MX. Comment: The palpable nodular area seen grossly is comprised predominantly of intermediate grade ductal carcinoma in situ with only two foci of invasive ductal carcinoma. Both invasive foci are morphologically similar. As such a breast prognostic profile will only be performed on the larger tumor.  A breast prognostic profile can be performed upon the smaller tumor per request. (RH:kh 12-21-11) 2 of 4 FINAL for Rubano, Maronda M ([id number]) Microscopic Comment(continued) Zandra Abts MD Pathologist, Electronic Signature (Case signed 12/21/2011) Intraoperative Diagnosis 1. RECEIVED FRESH IS TISSUE THAT CONTAINS A LYMPH NODE WHICH IS SAMPLED FOR RIOC. LEFT AXILLARY SENTINEL LYMPH NODE, FROZEN SECTION AND TOUCH PREPARATION- ONE LYMPH NODE WITH NO METASTATIC CARCINOMA IDENTIFIED. (RAH) 2. RECEIVED FRESH IS TISSUE THAT CONTAINS A LYMPH NODE WHICH IS SAMPLED FOR RIOC. LEFT AXILLARY SENTINEL LYMPH NODE, FROZEN SECTION AND TOUCH PREPARATION- ONE LYMPH NODE WITH NO METASTATIC CARCINOMA IDENTIFIED. (RAH) 3. RECEIVED FRESH IS TISSUE THAT CONTAINS A LYMPH NODE WHICH IS SAMPLED FOR RIOC. LEFT AXILLARY SENTINEL LYMPH NODE, FROZEN SECTION AND TOUCH PREPARATION- ONE LYMPH NODE WITH NO METASTATIC CARCINOMA IDENTIFIED. (RAH) 4. RECEIVED FRESH IS TISSUE THAT CONTAINS A LYMPH NODE WHICH IS SAMPLED FOR RIOC. LEFT AXILLARY SENTINEL LYMPH NODE, FROZEN SECTION AND TOUCH PREPARATION- ONE LYMPH NODE WITH NO METASTATIC CARCINOMA IDENTIFIED. (RAH) Specimen Gross and Clinical Information Specimen(s) Obtained: 1. Lymph node, sentinel, biopsy, Left axillary 2. Lymph node, sentinel, biopsy, Left axilla 3. Lymph node, sentinel, biopsy, Left axilla 4. Lymph node, sentinel, biopsy, Left axilla 5. Breast, simple mastectomy, Left Specimen Clinical Information 1. Left breast cancer (tl) Gross 1. Rapid Intraoperative Consult performed (Yes or No): Yes. Specimen: Left axillary sentinel node. Number and size: One node, 2.2 cm Cut Surface(s): Soft, fatty, focally blue. Block Summary: The node is sectioned, touch preparations are made on one slide, and the tissue is submitted in one block labeled SLN for frozen section. 2. Rapid Intraoperative Consult performed (Yes or No): Yes. Specimen: Left axillary sentinel  node. Number and size: One node, 2.1 cm. Cut Surface(s): Soft, fatty, focally blue. Block Summary: The node is bisected, touch preparations are made on one slide, and is submitted in one block labeled SLN for frozen section. 3. Rapid Intraoperative Consult performed (Yes or No): Yes. Specimen: Left axillary sentinel node. Number and size: One node, 0.7 cm Cut Surface(s): Soft, fatty, focally blue. Block Summary: The node is bisected, touch preparations are made on one slide, and is submitted in one block labeled SLN for frozen section. 4. Rapid Intraoperative Consult performed (Yes or No): Yes. Specimen: Left axillary sentinel node. Number and size: One node, 1.5 cm. 3 of 4 FINAL for Carey, Pepper M ([id number]) Gross(continued) Cut Surface(s): Soft, fatty, non blue. Block Summary: The node is bisected, touch preparations are made on one slide and is submitted in one block labeled SLN for frozen section. (SW:gt, 12/20/11) 5. Specimen: Left simple mastectomy, received fresh. The specimen was placed in formalin at 2:50 p.m. on 12/19/11. Specimen integrity (intact/disrupted): Intact. Weight: 1185 grams. Size: 28 x 24.5 x 7 cm. Skin: There is an attached 16 x 7.5 cm ellipse of skin with a central, unremarkable nipple. There is a suture attached at the superior edge. Tumor/cavity: In the mid central to mid lateral breast there is an ill defined area of palpable nodular fibrous tissue without a discrete mass identified. This area measures approximately 7 x 6 x 3 cm and extends to within 1.5 cm of the deep margin. Within this area there is a 1.5 cm cavitary defect, consistent with a previous biopsy site. There is a silver metallic biopsy clip present at the defect. Uninvolved parenchyma: The remainder of the breast tissue consists of fat and dense white fibrous tissue. Prognostic indicators: Obtain from paraffin blocks if needed. Lymph nodes: There are no  lymph nodes identified. Block summary:  13 blocks submitted. A = deep margin at nodular area. B - I = sections of nodular area, sequentially labeled from lateral through medial, margins not included. J = lower medial. K = upper medial. L = lower lateral. M = upper lateral. (GP:eps 12/20/11)  RADIOGRAPHIC STUDIES:  Nm Sentinel Node Inj-no Rpt (breast)  12/19/2011  CLINICAL DATA: breast cancer   Sulfur colloid was injected intradermally by the nuclear medicine  technologist for breast cancer sentinel node localization.      ASSESSMENT: 51 year old female with  #1 stage II invasive ductal carcinoma of the left breast with ductal carcinoma in situ. Patient is status post mastectomy that showed 2 foci of invasive cancer measuring 0.8 0.6 cm. The tumor is ER positive PR positive HER-2/neu amplified at 3.27. Patient is now seen in medical oncology for discussion of adjuvant treatment. As mentioned above her case was discussed at the multidisciplinary breast conference. Patient is noted to have one of 4 lymph nodes positive for metastatic disease. This was only axillary sentinel node biopsy. A discussion of whether patient under should undergo a full axillary lymph node dissection did take place but at the end of our extensive discussion it was recommended the patient will proceed with radiation and DOS she would feel the criteria of seed 11. Therefore is excellent lymph node dissection is not being done. Instead the patient will proceed with adjuvant chemotherapy and Herceptin. Her chemotherapy will consist of Taxotere carboplatinum given every 21 days. For the duration of her chemotherapy patient will get Herceptin on a weekly basis. Once she completes the chemotherapy she then will get Herceptin every 3 weeks. After completion of chemotherapy patient will proceed with radiation therapy with concurrent Herceptin. After completion of radiation she will start Herceptin and antiestrogen therapy consisting of either tamoxifen or an aromatase  inhibitor.  #2 is continuing radiation therapy with Herceptin being given every 3 weeks.  #3 Port has been replaced.  PLAN:  #1 proceed with Herceptin today.  She's tolerating radiation well and will complete it on 06/04/12  # she will be seen back in 3 weeks' time in followup.  All questions were answered. The patient knows to call the clinic with any problems, questions or concerns. We can certainly see the patient much sooner if necessary.  I spent 15 minutes counseling the patient face to face. The total time spent in the appointment was 30 minutes.  This case was reviewed with Dr. Welton Flakes.    Cherie Ouch Lyn Hollingshead, NP Medical Oncology [facility] Phone: [phone]   06/01/2012, 3:22 PM

## 2012-06-01 NOTE — Patient Instructions (Addendum)
Doing well, proceed with Herceptin.  We will see you back in 3 weeks for your next cycle.  Please call us if you have any questions or concerns.

## 2012-06-01 NOTE — Patient Instructions (Signed)
[facility] Discharge Instructions for Patients Receiving Chemotherapy  Today you received the following chemotherapy agents Herceptin  To help prevent nausea and vomiting after your treatment, we encourage you to take your nausea medication as prescribed.   If you develop nausea and vomiting that is not controlled by your nausea medication, call the clinic. If it is after clinic hours your family physician or the after hours number for the clinic or go to the Emergency Department.   BELOW ARE SYMPTOMS THAT SHOULD BE REPORTED IMMEDIATELY:  *FEVER GREATER THAN 100.5 F  *CHILLS WITH OR WITHOUT FEVER  NAUSEA AND VOMITING THAT IS NOT CONTROLLED WITH YOUR NAUSEA MEDICATION  *UNUSUAL SHORTNESS OF BREATH  *UNUSUAL BRUISING OR BLEEDING  TENDERNESS IN MOUTH AND THROAT WITH OR WITHOUT PRESENCE OF ULCERS  *URINARY PROBLEMS  *BOWEL PROBLEMS  UNUSUAL RASH Items with * indicate a potential emergency and should be followed up as soon as possible.  Feel free to call the clinic you have any questions or concerns. The clinic phone number is [phone].   I have been informed and understand all the instructions given to me. I know to contact the clinic, my physician, or go to the Emergency Department if any problems should occur. I do not have any questions at this time, but understand that I may call the clinic during office hours   should I have any questions or need assistance in obtaining follow up care.   

## 2012-06-04 ENCOUNTER — Encounter: Payer: Self-pay | Admitting: Radiation Oncology

## 2012-06-04 ENCOUNTER — Ambulatory Visit
Admission: RE | Admit: 2012-06-04 | Discharge: 2012-06-04 | Disposition: A | Discharge status: Home / Self Care | Payer: BC Managed Care – PPO | Source: Ambulatory Visit | Attending: Radiation Oncology | Admitting: Radiation Oncology

## 2012-06-04 ENCOUNTER — Ambulatory Visit: Payer: BC Managed Care – PPO

## 2012-06-04 VITALS — BP 137/78 | HR 60 | Temp 98.0°F | Wt 190.7 lb

## 2012-06-04 DIAGNOSIS — C50419 Malignant neoplasm of upper-outer quadrant of unspecified female breast: Secondary | ICD-10-CM

## 2012-06-04 NOTE — Progress Notes (Signed)
Julie Ross in exam room 5.  Completes treatment today to left chest wall.  Bright erythema to left chest wall but skin intact.  Denies any pain.

## 2012-06-05 ENCOUNTER — Ambulatory Visit: Payer: BC Managed Care – PPO

## 2012-06-05 NOTE — Progress Notes (Signed)
[facility]    Radiation Oncology [address] [place]     Maryln Gottron, M.D. [place], [place] [address]               Billie Lade, M.D., Ph.D. Phone: [phone]      Molli Hazard A. Kathrynn Running, M.D. Fax: [phone]      Radene Gunning, M.D., Ph.D.         Lurline Hare, M.D.         Grayland Jack, M.D Weekly Treatment Management Note  Name: Julie Ross     MRN: [id number]        CSN: [id number] Date: 06/05/2012      DOB: 28-Dec-1960  CC: Julie Ross    Status: Outpatient  Diagnosis: The encounter diagnosis was Cancer of upper-outer quadrant of female breast.  Current Dose: 59.4 Gy  Current Fraction: 33  Planned Dose: 59.4 Gy  Narrative: Julie Ross was seen today for weekly treatment management. The chart was checked and port films  were reviewed. She continues to have a lot of fatigue. She has noticed some soreness and itching along the treatment area. She is happy to complete her radiation therapy today.  Review of patient's allergies indicates no known allergies.  Current Outpatient Prescriptions  Medication Sig Dispense Refill  . ALPRAZolam (XANAX) 0.5 MG tablet Take 1 tablet (0.5 mg total) by mouth 3 (three) times daily as needed for anxiety.  15 tablet  0  . atenolol (TENORMIN) 25 MG tablet Take 0.5 tablets (12.5 mg total) by mouth daily. For blood pressure  15 tablet  0  . bacitracin ointment Apply topically 2 (two) times daily.  120 g    . baclofen (LIORESAL) 10 MG tablet       . CALCIUM-MAGNESIUM PO Take 1 tablet by mouth daily.      . cephALEXin (KEFLEX) 500 MG capsule Take 1 capsule (500 mg total) by mouth 4 (four) times daily.  120 capsule  4  . clonazePAM (KLONOPIN) 0.5 MG tablet Take 0.5 mg by mouth Nightly.      . docusate sodium (COLACE) 100 MG capsule Take 100 mg by mouth daily.      . DULoxetine (CYMBALTA) 60 MG capsule Take 60 mg by mouth.      . emollient (BIAFINE) cream Apply topically 2 (two) times daily.       Marland Kitchen esomeprazole (NEXIUM) 40 MG capsule Take 40 mg by mouth daily before breakfast.      . gabapentin (NEURONTIN) 100 MG capsule Take 1 capsule (100 mg total) by mouth 3 (three) times daily. For neuropathy  90 capsule  1  . hydrochlorothiazide (HYDRODIURIL) 25 MG tablet       . HYDROcodone-acetaminophen (NORCO) 10-325 MG per tablet every 8 (eight) hours as needed.       . IRON PO Take 45 mg by mouth every other day.       . lidocaine-prilocaine (EMLA) cream APPLY AS NEEDED  30 g  0  . losartan (COZAAR) 100 MG tablet       . losartan-hydrochlorothiazide (HYZAAR) 100-25 MG per tablet       . Multiple Vitamins-Minerals (MULTIVITAMINS THER. W/MINERALS) TABS Take 1 tablet by mouth daily.      . Probiotic Product (PROBIOTIC DAILY PO) Take by mouth.      . prochlorperazine (COMPAZINE) 10 MG tablet TAKE 1 TABLET BY MOUTH EVERY 6 HOURS AS NEEDED  FOR NAUSEA AND VOMITING  30 tablet  2   No current facility-administered medications for this encounter.   Facility-Administered Medications Ordered in Other Encounters  Medication Dose Route Frequency Provider Last Rate Last Dose  . ceFAZolin (ANCEF) IVPB 2 g/50 mL premix    PRN Lance Coon, CRNA   2 g at 05/01/12 0855  . dexamethasone (DECADRON) injection    PRN Lance Coon, CRNA   8 mg at 05/01/12 0908  . fentaNYL (SUBLIMAZE) injection    PRN Lance Coon, CRNA   100 mcg at 05/01/12 0856  . lidocaine (cardiac) 100 mg/44ml (XYLOCAINE) 20 MG/ML injection 2%    PRN Lance Coon, CRNA   60 mg at 05/01/12 0856  . midazolam (VERSED) 5 MG/5ML injection    PRN Lance Coon, CRNA   2 mg at 05/01/12 0856  . propofol (DIPRIVAN) 10 mg/mL bolus/IV push    PRN Lance Coon, CRNA   160 mg at 05/01/12 0900   Labs:  Lab Results  Component Value Date   WBC 6.1 06/01/2012   HGB 11.0* 06/01/2012   HCT 32.5* 06/01/2012   MCV 82.1 06/01/2012   PLT 252 06/01/2012   Lab Results  Component Value Date   CREATININE 1.0 06/01/2012   BUN 14.0 06/01/2012   NA  135* 06/01/2012   K 3.7 06/01/2012   CL 99 06/01/2012   CO2 27 06/01/2012   Lab Results  Component Value Date   ALT 11 06/01/2012   AST 13 06/01/2012   PHOS 4.6 03/16/2012   BILITOT 0.52 06/01/2012    Physical Examination:  weight is 190 lb 11.2 oz (86.501 kg). Her temperature is 98 F (36.7 C). Her blood pressure is 137/78 and her pulse is 60.    Wt Readings from Last 3 Encounters:  06/04/12 190 lb 11.2 oz (86.501 kg)  06/01/12 190 lb 12.8 oz (86.546 kg)  05/31/12 191 lb 9.6 oz (86.909 kg)    The left chest wall area shows erythema and hyperpigmentation changes but no moist desquamation Lungs - Normal respiratory effort, chest expands symmetrically. Lungs are clear to auscultation, no crackles or wheezes.  Heart has regular rhythm and rate  Abdomen is soft and non tender with normal bowel sounds  Assessment:  Patient tolerating treatments well except for issues as above  Plan: Routine followup in one month or sooner if skin becomes an issue.

## 2012-06-06 ENCOUNTER — Encounter: Payer: Self-pay | Admitting: Radiation Oncology

## 2012-06-06 NOTE — Progress Notes (Signed)
   Department of Radiation Oncology  Phone:  [phone] Fax:        [phone]   Electron Beam Simulation Note  On 05/14/12 the pt. Had set up of custom electron cutout field directed at the left chestwall/mastectomy scar.  Treatment will be with 6 MeV electrons and 0.5 cm bolus prescribed to the 90% isodose line.  A special port plan is requested for treatment.  -----------------------------------  Billie Lade, PhD, MD

## 2012-06-06 NOTE — Progress Notes (Signed)
  Radiation Oncology         [phone] ________________________________  Name: Julie Ross MRN: [id number]  Date: 06/06/2012  DOB: Oct 22, 1960  End of Treatment Note  Diagnosis:   Left breast cancer     Indication for treatment:  Post mastectomy       Radiation treatment dates:   04/17/12-06/04/12  Site/dose:   Left chest wall, axilla, left supraclav. Fossa-4500 cGy                      Left mastectomy scar/chest wall 5940 cGy  Beams/energy:   4 field setup, conformal plan, 6 Mev electrons for boost field  Narrative: The patient tolerated radiation treatment relatively well.   She did have fatigue and skin irritation, but no moist desq.  Plan: The patient has completed radiation treatment. The patient will return to radiation oncology clinic for routine followup in one month. I advised them to call or return sooner if they have any questions or concerns related to their recovery or treatment.  -----------------------------------  Billie Lade, PhD, MD

## 2012-06-08 ENCOUNTER — Other Ambulatory Visit: Payer: BC Managed Care – PPO | Admitting: Lab

## 2012-06-08 ENCOUNTER — Ambulatory Visit: Payer: BC Managed Care – PPO

## 2012-06-11 ENCOUNTER — Other Ambulatory Visit: Payer: Self-pay | Admitting: *Deleted

## 2012-06-11 ENCOUNTER — Ambulatory Visit (HOSPITAL_COMMUNITY)
Admission: RE | Admit: 2012-06-11 | Discharge: 2012-06-11 | Disposition: A | Discharge status: Home / Self Care | Payer: BC Managed Care – PPO | Source: Ambulatory Visit | Attending: Internal Medicine | Admitting: Internal Medicine

## 2012-06-11 VITALS — BP 154/90 | HR 71 | Wt 190.8 lb

## 2012-06-11 DIAGNOSIS — C50419 Malignant neoplasm of upper-outer quadrant of unspecified female breast: Secondary | ICD-10-CM

## 2012-06-11 DIAGNOSIS — I10 Essential (primary) hypertension: Secondary | ICD-10-CM | POA: Insufficient documentation

## 2012-06-11 DIAGNOSIS — I059 Rheumatic mitral valve disease, unspecified: Secondary | ICD-10-CM

## 2012-06-11 NOTE — Assessment & Plan Note (Signed)
Doing well clinically. No evidence of cardiotoxicity. Is due for repeat echo. Will try to schedule for today.

## 2012-06-11 NOTE — Patient Instructions (Addendum)
Your physician has requested that you have an echocardiogram. Echocardiography is a painless test that uses sound waves to create images of your heart. It provides your doctor with information about the size and shape of your heart and how well your heart's chambers and valves are working. This procedure takes approximately one hour. There are no restrictions for this procedure.  TODAY AND IN 3 MONTHS  Your physician recommends that you schedule a follow-up appointment in: 3 months

## 2012-06-11 NOTE — Addendum Note (Signed)
Encounter addended by: Noralee Space, RN on: 06/11/2012 11:19 AM<BR>     Documentation filed: Patient Instructions Section, Orders

## 2012-06-11 NOTE — Progress Notes (Signed)
Patient ID: Julie Ross, female   DOB: [birth year], 51 y.o.   MRN: [id number]  Orthopedic: Dr Luiz Blare Oncologist: Dr Welton Flakes ID: Dr Ninetta Lights  HPI: Julie Ross is a 51 year old female with ductal carcinoma in situ of the left breast, HTN, osteoarthritis, and MS (1998)  S/P L mastectomy and porta cath  12/19/11.  Plan to start carboplatnum toxotere/Herceptin to start 01/27/12 for for 12 weeks. HER-2/neu positive. 1 of  4 lymph nodes + for metastatic disease.  Pathology T1 N1 M0. Stage II . 08/15/11 TKR  01/11/12 ECHO EF 60% poor window. 03/02/12 EF 60-65% lateal S' 13.9  She had 2 treatments of carbo/taxotere and Herceptin. Then was admitted for infection of L TKR. D/c'd 9/27. Now on IV abx x 6 weeks. Chemo was on hold.  Since we last saw her has gotten back on Herceptin and has had 4 treatments. Just finished XRT. + fatigue. Denies SOB/PND/Orthopnea. SBP ranging 112-150. On atenolol and losartan-HCTZ   Past Medical History  Diagnosis Date  . Hypertension   . Hypothyroidism   . Arthritis   . Neuromuscular disorder     MS TX WITH CYMBALTA   . Multiple sclerosis   . H/O colonoscopy   . H/O bone density study 03/2011  . Wears glasses   . Heart burn   . Depression   . Sleep apnea     MILD SLEEP APNEA, NO MACHINE  6 YRS AGO [facility]   . Breast cancer     lul/ER+PR+   Current Outpatient Prescriptions on File Prior to Encounter  Medication Sig Dispense Refill  . ALPRAZolam (XANAX) 0.5 MG tablet Take 1 tablet (0.5 mg total) by mouth 3 (three) times daily as needed for anxiety.  15 tablet  0  . atenolol (TENORMIN) 25 MG tablet Take 0.5 tablets (12.5 mg total) by mouth daily. For blood pressure  15 tablet  0  . baclofen (LIORESAL) 10 MG tablet 10 mg 3 (three) times daily.       Marland Kitchen CALCIUM-MAGNESIUM PO Take 1 tablet by mouth daily.      . cephALEXin (KEFLEX) 500 MG capsule Take 1 capsule (500 mg total) by mouth 4 (four) times daily.  120 capsule  4  . clonazePAM (KLONOPIN) 0.5 MG tablet Take 0.5 mg by  mouth Nightly.      . docusate sodium (COLACE) 100 MG capsule Take 100 mg by mouth daily.      . DULoxetine (CYMBALTA) 60 MG capsule Take 60 mg by mouth.      . emollient (BIAFINE) cream Apply topically 2 (two) times daily.      Marland Kitchen esomeprazole (NEXIUM) 40 MG capsule Take 40 mg by mouth daily before breakfast.      . gabapentin (NEURONTIN) 100 MG capsule Take 1 capsule (100 mg total) by mouth 3 (three) times daily. For neuropathy  90 capsule  1  . IRON PO Take 45 mg by mouth every other day.       . lidocaine-prilocaine (EMLA) cream APPLY AS NEEDED  30 g  0  . losartan-hydrochlorothiazide (HYZAAR) 100-25 MG per tablet 1 tablet daily.       . Multiple Vitamins-Minerals (MULTIVITAMINS THER. W/MINERALS) TABS Take 1 tablet by mouth daily.      . Probiotic Product (PROBIOTIC DAILY PO) Take by mouth.      . prochlorperazine (COMPAZINE) 10 MG tablet TAKE 1 TABLET BY MOUTH EVERY 6 HOURS AS NEEDED FOR NAUSEA AND VOMITING  30 tablet  2  . bacitracin ointment  Apply topically 2 (two) times daily.  120 g    . HYDROcodone-acetaminophen (NORCO) 10-325 MG per tablet every 8 (eight) hours as needed.        Current Facility-Administered Medications on File Prior to Encounter  Medication Dose Route Frequency Provider Last Rate Last Dose  . ceFAZolin (ANCEF) IVPB 2 g/50 mL premix    PRN Lance Coon, CRNA   2 g at 05/01/12 0855  . dexamethasone (DECADRON) injection    PRN Lance Coon, CRNA   8 mg at 05/01/12 0908  . fentaNYL (SUBLIMAZE) injection    PRN Lance Coon, CRNA   100 mcg at 05/01/12 0856  . lidocaine (cardiac) 100 mg/62ml (XYLOCAINE) 20 MG/ML injection 2%    PRN Lance Coon, CRNA   60 mg at 05/01/12 0856  . midazolam (VERSED) 5 MG/5ML injection    PRN Lance Coon, CRNA   2 mg at 05/01/12 0856  . propofol (DIPRIVAN) 10 mg/mL bolus/IV push    PRN Lance Coon, CRNA   160 mg at 05/01/12 0900     PHYSICAL EXAM: Filed Vitals:   06/11/12 1051  BP: 154/90  Pulse: 71   General:   No acute  distress. No respiratory difficulty (daughter present) HEENT: normal except for alopecia Neck: supple. no JVD. RIJ port.  Carotids 2+ bilat; no bruits. No lymphadenopathy or thryomegaly appreciated. Cor: PMI nondisplaced. Regular rate & rhythm. No rubs, gallops. 2/6 SEM at RSB Lungs: clear Abdomen: soft, nontender, nondistended. No hepatosplenomegaly. No bruits or masses. Good bowel sounds. Extremities: no cyanosis, clubbing, rash, RLE 1+ LLE 2+ edema Neuro: alert & oriented x 3, cranial nerves grossly intact. moves all 4 extremities w/o difficulty. Affect pleasant.  ASSESSMENT & PLAN:

## 2012-06-11 NOTE — Assessment & Plan Note (Signed)
BP quite labile. Will follow daily pressures at home for 1 month and bring them to [place].

## 2012-06-14 ENCOUNTER — Other Ambulatory Visit: Payer: Self-pay

## 2012-06-15 ENCOUNTER — Other Ambulatory Visit: Payer: BC Managed Care – PPO | Admitting: Lab

## 2012-06-22 ENCOUNTER — Other Ambulatory Visit: Payer: BC Managed Care – PPO | Admitting: Lab

## 2012-06-22 ENCOUNTER — Other Ambulatory Visit (HOSPITAL_BASED_OUTPATIENT_CLINIC_OR_DEPARTMENT_OTHER): Payer: BC Managed Care – PPO | Admitting: Lab

## 2012-06-22 ENCOUNTER — Ambulatory Visit (HOSPITAL_BASED_OUTPATIENT_CLINIC_OR_DEPARTMENT_OTHER): Payer: BC Managed Care – PPO | Admitting: Adult Health

## 2012-06-22 ENCOUNTER — Other Ambulatory Visit: Payer: Self-pay | Admitting: *Deleted

## 2012-06-22 ENCOUNTER — Ambulatory Visit (HOSPITAL_BASED_OUTPATIENT_CLINIC_OR_DEPARTMENT_OTHER): Payer: BC Managed Care – PPO

## 2012-06-22 ENCOUNTER — Ambulatory Visit: Payer: BC Managed Care – PPO | Admitting: Adult Health

## 2012-06-22 ENCOUNTER — Encounter: Payer: Self-pay | Admitting: Adult Health

## 2012-06-22 VITALS — BP 138/70 | HR 57 | Temp 97.5°F | Resp 20 | Ht 66.0 in | Wt 192.7 lb

## 2012-06-22 DIAGNOSIS — C50419 Malignant neoplasm of upper-outer quadrant of unspecified female breast: Secondary | ICD-10-CM

## 2012-06-22 DIAGNOSIS — Z170 Estrogen receptor positive status [ER+]: Secondary | ICD-10-CM

## 2012-06-22 DIAGNOSIS — C773 Secondary and unspecified malignant neoplasm of axilla and upper limb lymph nodes: Secondary | ICD-10-CM

## 2012-06-22 DIAGNOSIS — Z5112 Encounter for antineoplastic immunotherapy: Secondary | ICD-10-CM

## 2012-06-22 LAB — CBC WITH DIFFERENTIAL/PLATELET
BASO%: 1.5 % (ref 0.0–2.0)
Basophils Absolute: 0.1 10*3/uL (ref 0.0–0.1)
EOS%: 4.5 % (ref 0.0–7.0)
Eosinophils Absolute: 0.2 10*3/uL (ref 0.0–0.5)
HCT: 32.8 % — ABNORMAL LOW (ref 34.8–46.6)
HGB: 10.7 g/dL — ABNORMAL LOW (ref 11.6–15.9)
LYMPH%: 16.8 % (ref 14.0–49.7)
MCH: 27 pg (ref 25.1–34.0)
MCHC: 32.6 g/dL (ref 31.5–36.0)
MCV: 82.6 fL (ref 79.5–101.0)
MONO#: 0.3 10*3/uL (ref 0.1–0.9)
MONO%: 5.5 % (ref 0.0–14.0)
NEUT#: 3.8 10*3/uL (ref 1.5–6.5)
NEUT%: 71.7 % (ref 38.4–76.8)
Platelets: 284 10*3/uL (ref 145–400)
RBC: 3.97 10*6/uL (ref 3.70–5.45)
RDW: 13.1 % (ref 11.2–14.5)
WBC: 5.3 10*3/uL (ref 3.9–10.3)
lymph#: 0.9 10*3/uL (ref 0.9–3.3)

## 2012-06-22 LAB — COMPREHENSIVE METABOLIC PANEL (CC13)
ALT: 8 U/L (ref 0–55)
AST: 15 U/L (ref 5–34)
Albumin: 3.2 g/dL — ABNORMAL LOW (ref 3.5–5.0)
Alkaline Phosphatase: 157 U/L — ABNORMAL HIGH (ref 40–150)
BUN: 16 mg/dL (ref 7.0–26.0)
CO2: 28 mEq/L (ref 22–29)
Calcium: 9.5 mg/dL (ref 8.4–10.4)
Chloride: 104 mEq/L (ref 98–107)
Creatinine: 1.3 mg/dL — ABNORMAL HIGH (ref 0.6–1.1)
Glucose: 106 mg/dl — ABNORMAL HIGH (ref 70–99)
Potassium: 3.8 mEq/L (ref 3.5–5.1)
Sodium: 142 mEq/L (ref 136–145)
Total Bilirubin: 0.3 mg/dL (ref 0.20–1.20)
Total Protein: 7.4 g/dL (ref 6.4–8.3)

## 2012-06-22 MED ORDER — DIPHENHYDRAMINE HCL 25 MG PO CAPS
50.0000 mg | ORAL_CAPSULE | Freq: Once | ORAL | Status: AC
  Administered 2012-06-22: 50 mg via ORAL

## 2012-06-22 MED ORDER — ACETAMINOPHEN 325 MG PO TABS
650.0000 mg | ORAL_TABLET | Freq: Once | ORAL | Status: AC
  Administered 2012-06-22: 650 mg via ORAL

## 2012-06-22 MED ORDER — SODIUM CHLORIDE 0.9 % IJ SOLN
10.0000 mL | INTRAMUSCULAR | Status: DC | PRN
  Administered 2012-06-22: 10 mL
  Filled 2012-06-22: qty 10

## 2012-06-22 MED ORDER — SODIUM CHLORIDE 0.9 % IV SOLN
Freq: Once | INTRAVENOUS | Status: AC
  Administered 2012-06-22: 13:00:00 via INTRAVENOUS

## 2012-06-22 MED ORDER — TRASTUZUMAB CHEMO INJECTION 440 MG
6.0000 mg/kg | Freq: Once | INTRAVENOUS | Status: AC
  Administered 2012-06-22: 525 mg via INTRAVENOUS
  Filled 2012-06-22: qty 25

## 2012-06-22 MED ORDER — HEPARIN SOD (PORK) LOCK FLUSH 100 UNIT/ML IV SOLN
500.0000 [IU] | Freq: Once | INTRAVENOUS | Status: AC | PRN
  Administered 2012-06-22: 500 [IU]
  Filled 2012-06-22: qty 5

## 2012-06-22 NOTE — Patient Instructions (Signed)
[facility] Discharge Instructions for Patients Receiving Chemotherapy  Today you received the following chemotherapy agents Herceptin To help prevent nausea and vomiting after your treatment, we encourage you to take your nausea medication as directed  If you develop nausea and vomiting that is not controlled by your nausea medication, call the clinic. If it is after clinic hours your family physician or the after hours number for the clinic or go to the Emergency Department.   BELOW ARE SYMPTOMS THAT SHOULD BE REPORTED IMMEDIATELY:  *FEVER GREATER THAN 100.5 F  *CHILLS WITH OR WITHOUT FEVER  NAUSEA AND VOMITING THAT IS NOT CONTROLLED WITH YOUR NAUSEA MEDICATION  *UNUSUAL SHORTNESS OF BREATH  *UNUSUAL BRUISING OR BLEEDING  TENDERNESS IN MOUTH AND THROAT WITH OR WITHOUT PRESENCE OF ULCERS  *URINARY PROBLEMS  *BOWEL PROBLEMS  UNUSUAL RASH Items with * indicate a potential emergency and should be followed up as soon as possible.  One of the nurses will contact you 24 hours after your treatment. Please let the nurse know about any problems that you may have experienced. Feel free to call the clinic you have any questions or concerns. The clinic phone number is [phone].   I have been informed and understand all the instructions given to me. I know to contact the clinic, my physician, or go to the Emergency Department if any problems should occur. I do not have any questions at this time, but understand that I may call the clinic during office hours   should I have any questions or need assistance in obtaining follow up care.    __________________________________________  _____________  __________ Signature of Patient or Authorized Representative            Date                   Time    __________________________________________ Nurse's Signature    

## 2012-06-22 NOTE — Patient Instructions (Addendum)
Doing well, proceed with Herceptin.  Please call us if you have any questions or concerns.

## 2012-06-22 NOTE — Progress Notes (Signed)
OFFICE PROGRESS NOTE  CC  Julie Ross [address] [place] [address] Dr. Cyndia Bent Dr. Richardean Chimera Dr. Mardella Layman Dr. Gustavus Messing ([facility])  DIAGNOSIS: 52 year old female who originally was seen in the multidisciplinary breast clinic for discussion of new diagnosis of left breast cancer. She is now status post left mastectomy with the final pathology revealing an invasive ductal carcinoma that is ER positive PR positive HER-2/neu positive with Ki-67 11%.  PRIOR THERAPY:  #1 patient was originally seen in the multidisciplinary breast clinic on 11/30/2011 for what at that time looked like a ductal carcinoma in situ of the left breast. Due to the extent of disease she was recommended a mastectomy with axillary lymph node sampling.  #2 12/19/2011 patient had a left mastectomy with sentinel lymph node biopsy. Her final pathology revealed 2 foci of invasive ductal carcinoma measuring 0.8 and 0.6 cm. The tumor was low grade ER positive PR positive. Patient's tumor was found to be HER-2/neu positive with HER-2 ratio of 3.27. Ki-67 was 11%. Patient had sentinel lymph node  Biopsies performed one of 4 lymph nodes were positive for metastatic disease. Patient's final pathologic staging is T1 N1 M0. Stage II  #3 patient has received adjuvant chemotherapy consisting of Taxotere carboplatinum and Herceptin for 2 cycles. This was then complicated by development of bacteremia left knee sepsis requiring revision of the knee. Port-A-Cath site infection requiring removal of the Port-A-Cath and placement of a PICC line.  #4 patient will now receive Herceptin every 3 weeks with radiation therapy this is to continue until patient finishes up her antibiotics then we may consider her completing her 2 cycles of Taxotere and carboplatinum combination.  CURRENT THERAPY: Patient will proceed with Herceptin every 3 weeks  INTERVAL HISTORY: Julie Ross 52  y.o. female returns for Followup visit. She continues to do well.  Her appt with Dr. Orvan Falconer is on Monday January 6.  She did have an echo and Dr. Kittie Plater appt on 12/23, her EF was 50-55%.  Her only complaint is fatigue.  She does have some right knee pain, due to arthritis.  Otherwise she is feeling well and w/o questions/concerns.    MEDICAL HISTORY: Past Medical History  Diagnosis Date  . Hypertension   . Hypothyroidism   . Arthritis   . Neuromuscular disorder     MS TX WITH CYMBALTA   . Multiple sclerosis   . H/O colonoscopy   . H/O bone density study 03/2011  . Wears glasses   . Heart burn   . Depression   . Sleep apnea     MILD SLEEP APNEA, NO MACHINE  6 YRS AGO HPT [facility]   . Breast cancer     lul/ER+PR+    ALLERGIES:   has no known allergies.  MEDICATIONS:  Current Outpatient Prescriptions  Medication Sig Dispense Refill  . ALPRAZolam (XANAX) 0.5 MG tablet Take 1 tablet (0.5 mg total) by mouth 3 (three) times daily as needed for anxiety.  15 tablet  0  . atenolol (TENORMIN) 25 MG tablet Take 0.5 tablets (12.5 mg total) by mouth daily. For blood pressure  15 tablet  0  . baclofen (LIORESAL) 10 MG tablet 10 mg 3 (three) times daily.       Marland Kitchen CALCIUM-MAGNESIUM PO Take 1 tablet by mouth daily.      . cephALEXin (KEFLEX) 500 MG capsule Take 1 capsule (500 mg total) by mouth 4 (four) times daily.  120 capsule  4  . clonazePAM (KLONOPIN) 0.5 MG tablet Take 0.5 mg by mouth Nightly.      . docusate sodium (COLACE) 100 MG capsule Take 100 mg by mouth daily.      . DULoxetine (CYMBALTA) 60 MG capsule Take 60 mg by mouth.      . emollient (BIAFINE) cream Apply topically 2 (two) times daily.      Marland Kitchen esomeprazole (NEXIUM) 40 MG capsule Take 40 mg by mouth daily before breakfast.      . gabapentin (NEURONTIN) 100 MG capsule Take 1 capsule (100 mg total) by mouth 3 (three) times daily. For neuropathy  90 capsule  1  . HYDROcodone-acetaminophen (NORCO) 10-325 MG per tablet every 8  (eight) hours as needed.       . IRON PO Take 45 mg by mouth every other day.       . lidocaine-prilocaine (EMLA) cream APPLY AS NEEDED  30 g  0  . losartan-hydrochlorothiazide (HYZAAR) 100-25 MG per tablet 1 tablet daily.       . Multiple Vitamins-Minerals (MULTIVITAMINS THER. W/MINERALS) TABS Take 1 tablet by mouth daily.      . Probiotic Product (PROBIOTIC DAILY PO) Take by mouth.      . prochlorperazine (COMPAZINE) 10 MG tablet TAKE 1 TABLET BY MOUTH EVERY 6 HOURS AS NEEDED FOR NAUSEA AND VOMITING  30 tablet  2  . bacitracin ointment Apply topically 2 (two) times daily.  120 g     No current facility-administered medications for this visit.   Facility-Administered Medications Ordered in Other Visits  Medication Dose Route Frequency Provider Last Rate Last Dose  . ceFAZolin (ANCEF) IVPB 2 g/50 mL premix    PRN Lance Coon, CRNA   2 g at 05/01/12 0855  . dexamethasone (DECADRON) injection    PRN Lance Coon, CRNA   8 mg at 05/01/12 0908  . fentaNYL (SUBLIMAZE) injection    PRN Lance Coon, CRNA   100 mcg at 05/01/12 0856  . lidocaine (cardiac) 100 mg/74ml (XYLOCAINE) 20 MG/ML injection 2%    PRN Lance Coon, CRNA   60 mg at 05/01/12 0856  . midazolam (VERSED) 5 MG/5ML injection    PRN Lance Coon, CRNA   2 mg at 05/01/12 0856  . propofol (DIPRIVAN) 10 mg/mL bolus/IV push    PRN Lance Coon, CRNA   160 mg at 05/01/12 0900  . sodium chloride 0.9 % injection 10 mL  10 mL Intracatheter PRN Victorino December, MD   10 mL at 06/22/12 1402    SURGICAL HISTORY:  Past Surgical History  Procedure Date  . Tonsillectomy   . Tumor removal     FROM PELVIS AGE 57  . Cesarean section     X2   . No past surgeries     BLADDER SLING  4-5 YRS AGO   . Knee arthroscopy     LT KNEE 04/2011  . Total knee arthroplasty 08/15/2011    Procedure: TOTAL KNEE ARTHROPLASTY; lft Surgeon: Harvie Junior, MD;  Location: [facility] OR;  Service: Orthopedics;  Laterality: Left;  RIGHT KNEE CORTIZONE INJECTION  .  Mohs surgery     right face  . Knee arthroscopy 82    rt  . Joint replacement Feb 2013  . Mastectomy w/ sentinel node biopsy 12/19/2011    Procedure: MASTECTOMY WITH SENTINEL LYMPH NODE BIOPSY;  Surgeon: Currie Paris, MD;  Location: [facility] OR;  Service: General;  Laterality: Left;  left breast and left axilla   .  Portacath placement 01/16/2012    Procedure: INSERTION PORT-A-CATH;  Surgeon: Currie Paris, MD;  Location: [facility];  Service: General;  Laterality: Right;  Porta Cath Placement   . I&d knee with poly exchange 03/02/2012    Procedure: IRRIGATION AND DEBRIDEMENT KNEE WITH POLY EXCHANGE;  Surgeon: Harvie Junior, MD;  Location: [facility] OR;  Service: Orthopedics;  Laterality: Left;  I&D of total knee with Possible Poly Exchange   . Tee without cardioversion 03/06/2012    Procedure: TRANSESOPHAGEAL ECHOCARDIOGRAM (TEE);  Surgeon: Wendall Stade, MD;  Location: [facility];  Service: Cardiovascular;  Laterality: N/A;  Rm. [id number]  . Port-a-cath removal 03/06/2012    Procedure: REMOVAL PORT-A-CATH;  Surgeon: Adolph Pollack, MD;  Location: [facility] OR;  Service: General;  Laterality: N/A;  . Portacath placement 05/01/2012    Procedure: INSERTION PORT-A-CATH;  Surgeon: Currie Paris, MD;  Location: [facility];  Service: General;  Laterality: Right;  right internal jugular port-a-cath insertion    REVIEW OF SYSTEMS:   General: fatigue (+), night sweats (-), fever (-), pain (-) Lymph: palpable nodes (-) HEENT: vision changes (-), mucositis (-), gum bleeding (-), epistaxis (-) Cardiovascular: chest pain (-), palpitations (-) Pulmonary: shortness of breath (-), dyspnea on exertion (-), cough (-), hemoptysis (-) GI:  Early satiety (-), melena (-), dysphagia (-), nausea/vomiting (-), diarrhea (-) GU: dysuria (-), hematuria (-), incontinence (-) Musculoskeletal: joint swelling (-), joint pain (-), back pain (-) Neuro: weakness (-), numbness (-), headache (-),  confusion (-) Skin: Rash (-), lesions (-), dryness (-) Psych: depression (-), suicidal/homicidal ideation (-), feeling of hopelessness (-)   PHYSICAL EXAMINATION:  BP 138/70  Pulse 57  Temp 97.5 F (36.4 C)  Resp 20  Ht 5\' 6"  (1.676 m)  Wt 192 lb 11.2 oz (87.408 kg)  BMI 31.10 kg/m2 General: Patient is a well appearing female in no acute distress HEENT: PERRLA, sclerae anicteric no conjunctival pallor, MMM Neck: supple, no palpable adenopathy Lungs: clear to auscultation bilaterally, no wheezes, rhonchi, or rales Cardiovascular: regular rate rhythm, S1, S2, no murmurs, rubs or gallops Abdomen: Soft, non-tender, non-distended, normoactive bowel sounds, no HSM Extremities: warm and well perfused, no clubbing, cyanosis, or edema Skin: No rashes or lesions Neuro: Non-focal Breasts: left chest wall mastectomy site no nodularity, erythematous from radiation.   ECOG PERFORMANCE STATUS: 1 - Symptomatic but completely ambulatory  LABORATORY DATA: Lab Results  Component Value Date   WBC 5.3 06/22/2012   HGB 10.7* 06/22/2012   HCT 32.8* 06/22/2012   MCV 82.6 06/22/2012   PLT 284 06/22/2012      Chemistry      Component Value Date/Time   NA 142 06/22/2012 1128   NA 135 04/30/2012 1530   K 3.8 06/22/2012 1128   K 4.0 04/30/2012 1530   CL 104 06/22/2012 1128   CL 96 04/30/2012 1530   CO2 28 06/22/2012 1128   CO2 26 04/30/2012 1530   BUN 16.0 06/22/2012 1128   BUN 20 04/30/2012 1530   CREATININE 1.3* 06/22/2012 1128   CREATININE 1.10 04/30/2012 1530      Component Value Date/Time   CALCIUM 9.5 06/22/2012 1128   CALCIUM 9.8 04/30/2012 1530   ALKPHOS 157* 06/22/2012 1128   ALKPHOS 494* 03/09/2012 0535   AST 15 06/22/2012 1128   AST 18 03/09/2012 0535   ALT 8 06/22/2012 1128   ALT 11 03/09/2012 0535   BILITOT 0.30 06/22/2012 1128   BILITOT 0.2* 03/09/2012 0535  DOB: 1961-05-27 Age: 18 Gender: F Client Name Eligha Bridegroom. [facility] Collected Date: 12/19/2011 Received Date: 12/19/2011 Physician:  Cyndia Bent Chart #: MRN # : [id number] Physician cc: Richardean Chimera, MD Sherian Rein, MD Race:W Visit #: [id number] Kaylyn Lim, RN REPORT OF SURGICAL PATHOLOGY ADDITIONAL INFORMATION: 5. PROGNOSTIC INDICATORS - ACIS Results IMMUNOHISTOCHEMICAL AND MORPHOMETRIC ANALYSIS BY THE AUTOMATED CELLULAR IMAGING SYSTEM (ACIS) Estrogen Receptor (Negative, <1%): 100%, STRONG STAINING INTENSITY Progesterone Receptor (Negative, <1%): 100%, STRONG STAINING INTENSITY Proliferation Marker Ki67 by M IB-1 (Low<20%): 11% All controls stained appropriately Pecola Leisure MD Pathologist, Electronic Signature ( Signed 12/29/2011) 5. CHROMOGENIC IN-SITU HYBRIDIZATION Interpretation: HER2/NEU BY CISH - SHOWS AMPLIFICATION BY CISH ANALYSIS. THE RATIO OF HER2: CEP 17 SIGNALS WAS 3.27 Reference range: Ratio: HER2:CEP17 < 1.8 gene amplification not observed Ratio: HER2:CEP 17 1.8-2.2 - equivocal result Ratio: HER2:CEP17 > 2.2 - gene amplification observed Pecola Leisure MD Pathologist, Electronic Signature ( Signed 12/27/2011) 1 of 4 FINAL for Surrette, Skylin M ([id number]) FINAL DIAGNOSIS Diagnosis 1. Lymph node, sentinel, biopsy, Left axillary - ONE BENIGN LYMPH NODE WITH NO TUMOR SEEN (0/1). 2. Lymph node, sentinel, biopsy, Left axilla - ONE LYMPH NODE POSITIVE FOR METASTATIC DUCTAL CARCINOMA (1/1). - SEE COMMENT. 3. Lymph node, sentinel, biopsy, Left axilla - ONE BENIGN LYMPH NODE WITH NO TUMOR SEEN (0/1). 4. Lymph node, sentinel, biopsy, Left axilla - ONE BENIGN LYMPH NODE WITH NO TUMOR SEEN (0/1). 5. Breast, simple mastectomy, Left - INVASIVE GRADE I DUCTAL CARCINOMA, TWO FOCI MEASURING 0.8 CM AND 0.6 CM ARISING IN A BACKGROUND OF EXTENSIVE INTERMEDIATE GRADE DUCTAL CARCINOMA IN SITU WITH NECROSIS. - MARGINS ARE NEGATIVE. - SEE ONCOLOGY TEMPLATE. Microscopic Comment 2. On re-review of the frozen section slides, no metastatic carcinoma is identified. The carcinoma present is on permanent sections  only. 5. BREAST, INVASIVE TUMOR, WITH LYMPH NODE SAMPLING Specimen, including laterality: Left breast with sentinel lymph node. Procedure: Left simple mastectomy with sentinel lymph node biopsy. Grade: I (Both foci of tumors show similar morphology). Tubule formation: 2. Nuclear pleomorphism: 2. Mitotic: 1. Tumor size (glass slide measurement): Two foci measuring 0.8 cm and 0.6 cm. Margins: Invasive, distance to closest margin: At least 1.5 cm. In-situ, distance to closest margin: At least 1.5 cm. Lymphovascular invasion: No definite lymph/vascular invasion is not identified however a sentinel lymph node is positive (see below). Ductal carcinoma in situ: Yes. Grade: Intermediate grade. Extensive intraductal component: Yes. Lobular neoplasia: Not identified. Tumor focality: Two foci. Treatment effect: Not applicable. Extent of tumor: Tumor confined to breast parenchyma. Lymph nodes: # examined: 4. Lymph nodes with metastasis: 1. Macrometastasis: (> 2.0 mm): 1. Extracapsular extension: Not identified. Breast prognostic profile: A breast prognostic profile will be performed (see below comment). Additional breast findings: Fibrocystic changes with usual ductal hyperplasia. TNM: mpT1b, pN1a, MX. Comment: The palpable nodular area seen grossly is comprised predominantly of intermediate grade ductal carcinoma in situ with only two foci of invasive ductal carcinoma. Both invasive foci are morphologically similar. As such a breast prognostic profile will only be performed on the larger tumor. A breast prognostic profile can be performed upon the smaller tumor per request. (RH:kh 12-21-11) 2 of 4 FINAL for Case, Jakya M ([id number]) Microscopic Comment(continued) Zandra Abts MD Pathologist, Electronic Signature (Case signed 12/21/2011) Intraoperative Diagnosis 1. RECEIVED FRESH IS TISSUE THAT CONTAINS A LYMPH NODE WHICH IS SAMPLED FOR RIOC. LEFT AXILLARY SENTINEL LYMPH NODE, FROZEN  SECTION AND TOUCH PREPARATION- ONE LYMPH NODE WITH NO METASTATIC CARCINOMA IDENTIFIED. (RAH) 2. RECEIVED FRESH IS  TISSUE THAT CONTAINS A LYMPH NODE WHICH IS SAMPLED FOR RIOC. LEFT AXILLARY SENTINEL LYMPH NODE, FROZEN SECTION AND TOUCH PREPARATION- ONE LYMPH NODE WITH NO METASTATIC CARCINOMA IDENTIFIED. (RAH) 3. RECEIVED FRESH IS TISSUE THAT CONTAINS A LYMPH NODE WHICH IS SAMPLED FOR RIOC. LEFT AXILLARY SENTINEL LYMPH NODE, FROZEN SECTION AND TOUCH PREPARATION- ONE LYMPH NODE WITH NO METASTATIC CARCINOMA IDENTIFIED. (RAH) 4. RECEIVED FRESH IS TISSUE THAT CONTAINS A LYMPH NODE WHICH IS SAMPLED FOR RIOC. LEFT AXILLARY SENTINEL LYMPH NODE, FROZEN SECTION AND TOUCH PREPARATION- ONE LYMPH NODE WITH NO METASTATIC CARCINOMA IDENTIFIED. (RAH) Specimen Gross and Clinical Information Specimen(s) Obtained: 1. Lymph node, sentinel, biopsy, Left axillary 2. Lymph node, sentinel, biopsy, Left axilla 3. Lymph node, sentinel, biopsy, Left axilla 4. Lymph node, sentinel, biopsy, Left axilla 5. Breast, simple mastectomy, Left Specimen Clinical Information 1. Left breast cancer (tl) Gross 1. Rapid Intraoperative Consult performed (Yes or No): Yes. Specimen: Left axillary sentinel node. Number and size: One node, 2.2 cm Cut Surface(s): Soft, fatty, focally blue. Block Summary: The node is sectioned, touch preparations are made on one slide, and the tissue is submitted in one block labeled SLN for frozen section. 2. Rapid Intraoperative Consult performed (Yes or No): Yes. Specimen: Left axillary sentinel node. Number and size: One node, 2.1 cm. Cut Surface(s): Soft, fatty, focally blue. Block Summary: The node is bisected, touch preparations are made on one slide, and is submitted in one block labeled SLN for frozen section. 3. Rapid Intraoperative Consult performed (Yes or No): Yes. Specimen: Left axillary sentinel node. Number and size: One node, 0.7 cm Cut Surface(s): Soft, fatty, focally  blue. Block Summary: The node is bisected, touch preparations are made on one slide, and is submitted in one block labeled SLN for frozen section. 4. Rapid Intraoperative Consult performed (Yes or No): Yes. Specimen: Left axillary sentinel node. Number and size: One node, 1.5 cm. 3 of 4 FINAL for Toutant, Preeya M ([id number]) Gross(continued) Cut Surface(s): Soft, fatty, non blue. Block Summary: The node is bisected, touch preparations are made on one slide and is submitted in one block labeled SLN for frozen section. (SW:gt, 12/20/11) 5. Specimen: Left simple mastectomy, received fresh. The specimen was placed in formalin at 2:50 p.m. on 12/19/11. Specimen integrity (intact/disrupted): Intact. Weight: 1185 grams. Size: 28 x 24.5 x 7 cm. Skin: There is an attached 16 x 7.5 cm ellipse of skin with a central, unremarkable nipple. There is a suture attached at the superior edge. Tumor/cavity: In the mid central to mid lateral breast there is an ill defined area of palpable nodular fibrous tissue without a discrete mass identified. This area measures approximately 7 x 6 x 3 cm and extends to within 1.5 cm of the deep margin. Within this area there is a 1.5 cm cavitary defect, consistent with a previous biopsy site. There is a silver metallic biopsy clip present at the defect. Uninvolved parenchyma: The remainder of the breast tissue consists of fat and dense white fibrous tissue. Prognostic indicators: Obtain from paraffin blocks if needed. Lymph nodes: There are no lymph nodes identified. Block summary: 13 blocks submitted. A = deep margin at nodular area. B - I = sections of nodular area, sequentially labeled from lateral through medial, margins not included. J = lower medial. K = upper medial. L = lower lateral. M = upper lateral. (GP:eps 12/20/11)  RADIOGRAPHIC STUDIES:  Nm Sentinel Node Inj-no Rpt (breast)  12/19/2011  CLINICAL DATA: breast cancer   Sulfur colloid was injected  intradermally  by the nuclear medicine  technologist for breast cancer sentinel node localization.      ASSESSMENT: 52 year old female with  #1 stage II invasive ductal carcinoma of the left breast with ductal carcinoma in situ. Patient is status post mastectomy that showed 2 foci of invasive cancer measuring 0.8 0.6 cm. The tumor is ER positive PR positive HER-2/neu amplified at 3.27. Patient is now seen in medical oncology for discussion of adjuvant treatment. As mentioned above her case was discussed at the multidisciplinary breast conference. Patient is noted to have one of 4 lymph nodes positive for metastatic disease. This was only axillary sentinel node biopsy. A discussion of whether patient under should undergo a full axillary lymph node dissection did take place but at the end of our extensive discussion it was recommended the patient will proceed with radiation and DOS she would feel the criteria of seed 11. Therefore is excellent lymph node dissection is not being done. Instead the patient will proceed with adjuvant chemotherapy and Herceptin. Her chemotherapy will consist of Taxotere carboplatinum given every 21 days. For the duration of her chemotherapy patient will get Herceptin on a weekly basis. Once she completes the chemotherapy she then will get Herceptin every 3 weeks. After completion of chemotherapy patient will proceed with radiation therapy with concurrent Herceptin. After completion of radiation she will start Herceptin and antiestrogen therapy consisting of either tamoxifen or an aromatase inhibitor.  #2 is continuing radiation therapy with Herceptin being given every 3 weeks.  #3 Port has been replaced.  PLAN:  #1 proceed with Herceptin today.  She's completed radiation.  We will wait for her appt with Dr. Orvan Falconer before deciding about giving her the last 2 cycles of chemotherapy.  She's still taking Keflex.    # she will be seen back in 3 weeks' time in followup.  All  questions were answered. The patient knows to call the clinic with any problems, questions or concerns. We can certainly see the patient much sooner if necessary.  I spent 25 minutes counseling the patient face to face. The total time spent in the appointment was 30 minutes.  This case was reviewed with Dr. Welton Flakes.    Cherie Ouch Lyn Hollingshead, NP Medical Oncology [facility] Phone: [phone]   06/22/2012, 2:39 PM

## 2012-06-25 ENCOUNTER — Ambulatory Visit (INDEPENDENT_AMBULATORY_CARE_PROVIDER_SITE_OTHER): Payer: BC Managed Care – PPO | Admitting: Internal Medicine

## 2012-06-25 ENCOUNTER — Other Ambulatory Visit: Payer: Self-pay

## 2012-06-25 ENCOUNTER — Encounter: Payer: Self-pay | Admitting: Internal Medicine

## 2012-06-25 VITALS — BP 174/91 | HR 58 | Temp 97.7°F | Ht 66.0 in | Wt 193.5 lb

## 2012-06-25 DIAGNOSIS — T8459XA Infection and inflammatory reaction due to other internal joint prosthesis, initial encounter: Secondary | ICD-10-CM

## 2012-06-25 DIAGNOSIS — Z96659 Presence of unspecified artificial knee joint: Secondary | ICD-10-CM

## 2012-06-25 DIAGNOSIS — Y849 Medical procedure, unspecified as the cause of abnormal reaction of the patient, or of later complication, without mention of misadventure at the time of the procedure: Secondary | ICD-10-CM

## 2012-06-25 DIAGNOSIS — T8450XA Infection and inflammatory reaction due to unspecified internal joint prosthesis, initial encounter: Secondary | ICD-10-CM

## 2012-06-25 NOTE — Progress Notes (Addendum)
Patient ID: Julie Ross, female   DOB: 12-07-1960, 52 y.o.   MRN: [id number]    [facility] for Infectious Disease  Patient Active Problem List  Diagnosis  . Osteoarthritis of both knees  . Osteoarthritis of left knee  . Cancer of upper-outer quadrant of female breast  . Multiple sclerosis  . Dehydration  . Seroma, postoperative  . Infected prosthetic knee joint  . Hypokalemia  . Hyponatremia  . Bacteremia  . Septic joint of left knee joint  . Essential hypertension, benign    Patient's Medications  New Prescriptions   No medications on file  Previous Medications   ALPRAZOLAM (XANAX) 0.5 MG TABLET    Take 1 tablet (0.5 mg total) by mouth 3 (three) times daily as needed for anxiety.   ATENOLOL (TENORMIN) 25 MG TABLET    Take 0.5 tablets (12.5 mg total) by mouth daily. For blood pressure   BACITRACIN OINTMENT    Apply topically 2 (two) times daily.   BACLOFEN (LIORESAL) 10 MG TABLET    10 mg 3 (three) times daily.    CALCIUM-MAGNESIUM PO    Take 1 tablet by mouth daily.   CEPHALEXIN (KEFLEX) 500 MG CAPSULE    Take 1 capsule (500 mg total) by mouth 4 (four) times daily.   CLONAZEPAM (KLONOPIN) 0.5 MG TABLET    Take 0.5 mg by mouth Nightly.   DOCUSATE SODIUM (COLACE) 100 MG CAPSULE    Take 100 mg by mouth daily.   DULOXETINE (CYMBALTA) 60 MG CAPSULE    Take 60 mg by mouth.   EMOLLIENT (BIAFINE) CREAM    Apply topically 2 (two) times daily.   ESOMEPRAZOLE (NEXIUM) 40 MG CAPSULE    Take 40 mg by mouth daily before breakfast.   GABAPENTIN (NEURONTIN) 100 MG CAPSULE    Take 1 capsule (100 mg total) by mouth 3 (three) times daily. For neuropathy   HYDROCODONE-ACETAMINOPHEN (NORCO) 10-325 MG PER TABLET    every 8 (eight) hours as needed.    IRON PO    Take 45 mg by mouth every other day.    LIDOCAINE-PRILOCAINE (EMLA) CREAM    APPLY AS NEEDED   LOSARTAN-HYDROCHLOROTHIAZIDE (HYZAAR) 100-25 MG PER TABLET    1 tablet daily.    MULTIPLE VITAMINS-MINERALS (MULTIVITAMINS THER. W/MINERALS)  TABS    Take 1 tablet by mouth daily.   PROBIOTIC PRODUCT (PROBIOTIC DAILY PO)    Take by mouth.   PROCHLORPERAZINE (COMPAZINE) 10 MG TABLET    TAKE 1 TABLET BY MOUTH EVERY 6 HOURS AS NEEDED FOR NAUSEA AND VOMITING  Modified Medications   No medications on file  Discontinued Medications   No medications on file    Subjective: Ms. Gruhn is in for her routine visit. She is now completed just under 4 months of total antibiotic therapy for her MSSA bacteremia and left prosthetic knee infection. She underwent incision and drainage of that knee with poly-exchange on September 15. She feels like her left knee pain has improved. She still undergoing physical therapy and states that she feels like she improves after each session but then loses any increased range of motion she gained within 24 hours. She's not had any fever, chills or sweats. She has not had any problems tolerating her cephalexin.  Objective: Temp: 97.7 F (36.5 C) (01/06 1548) Temp src: Oral (01/06 1548) BP: 174/91 mmHg (01/06 1548) Pulse Rate: 58  (01/06 1548)  General: In no distress but with a flat affect Skin: No rash Lungs: Clear Cor:  Regular S1 and S2 no murmurs Her left knee incision has healed nicely. She has no overlying cellulitis but her knee is swollen and still slightly warm  Lab Results  Component Value Date   CRP 4.6* 04/11/2012    Lab Results  Component Value Date   ESRSEDRATE 122* 04/11/2012      Assessment: She is showing clinical improvement on therapy for her MSSA left prosthetic knee infection. Her sedimentation rate is up slightly but her C-reactive protein has gone down quite a bit. I will repeat her inflammatory markers when her port is accessed at the cancer center in 3 weeks.  She is most concerned about her increased right knee pain and knows that she will need to postpone consideration of right knee replacement until we know she is free of infection.  She states that she has had a  persistent seroma of her left mastectomy site but no signs of infection.  She asked if it is okay to continue chemotherapy for her breast cancer and I see no reason not to continue it while we complete 2 more months of cephalexin therapy for her knee infection.  Plan: 1. Continue cephalexin 2. Repeat sedimentation rate and C-reactive protein in 3 weeks at [facility] 3. Okay with me to continue cancer chemotherapy 4. Return in 2 months   Cliffton Asters, MD [facility] for Infectious Disease [facility] [phone] pager   [phone] cell 06/25/2012, 4:13 PM

## 2012-06-26 ENCOUNTER — Encounter (INDEPENDENT_AMBULATORY_CARE_PROVIDER_SITE_OTHER): Payer: Self-pay | Admitting: Surgery

## 2012-06-26 ENCOUNTER — Ambulatory Visit (INDEPENDENT_AMBULATORY_CARE_PROVIDER_SITE_OTHER): Payer: BC Managed Care – PPO | Admitting: Surgery

## 2012-06-26 VITALS — BP 124/80 | HR 60 | Resp 16 | Ht 66.0 in | Wt 195.0 lb

## 2012-06-26 DIAGNOSIS — Z09 Encounter for follow-up examination after completed treatment for conditions other than malignant neoplasm: Secondary | ICD-10-CM

## 2012-06-26 NOTE — Patient Instructions (Signed)
Make appointment to see me a few weeks after your last chemotherapy treatment

## 2012-06-26 NOTE — Progress Notes (Signed)
Chief complaint: Followup Lopressor, and after Port-A-Cath placement  History of present illness: This patient a month ago and she's had a chronic seroma the left mastectomy site. She also recent had a Port-A-Cath placed and we are rechecking that. She is continued on her Herceptin therapy but not restarted her active chemotherapy but that will happen soon.  Exam: Vital signs:BP 124/80  Pulse 60  Resp 16  Ht 5\' 6"  (1.676 m)  Wt 195 lb (88.451 kg)  BMI 31.47 kg/m2 General: Patient is alert but looks fatigued. Port-A-Cath; incision is healing nicely no evidence of infection or problems Left mastectomy site: No evidence of infection. There is a tiny residual seroma but this has improved since I saw her a month ago and I don't believe anything needs to be done  Impression: Improving stroma and the old mastectomy site  Plan: I'll see her back again after her last chemotherapy treatment. Port-A-Cath will needto stay until she finishes Herceptin which will be in August.

## 2012-06-27 ENCOUNTER — Other Ambulatory Visit: Payer: Self-pay | Admitting: Oncology

## 2012-07-09 ENCOUNTER — Ambulatory Visit: Payer: BC Managed Care – PPO | Admitting: Radiation Oncology

## 2012-07-09 ENCOUNTER — Encounter: Payer: Self-pay | Admitting: Oncology

## 2012-07-09 NOTE — Progress Notes (Signed)
This patient is no longer on Neulasta. I called and deactivated the co-pay assistance card for her.

## 2012-07-13 ENCOUNTER — Telehealth: Payer: Self-pay | Admitting: Oncology

## 2012-07-13 ENCOUNTER — Ambulatory Visit (HOSPITAL_BASED_OUTPATIENT_CLINIC_OR_DEPARTMENT_OTHER): Payer: BC Managed Care – PPO

## 2012-07-13 ENCOUNTER — Ambulatory Visit (HOSPITAL_BASED_OUTPATIENT_CLINIC_OR_DEPARTMENT_OTHER): Payer: BC Managed Care – PPO | Admitting: Oncology

## 2012-07-13 ENCOUNTER — Other Ambulatory Visit (HOSPITAL_BASED_OUTPATIENT_CLINIC_OR_DEPARTMENT_OTHER): Payer: BC Managed Care – PPO | Admitting: Lab

## 2012-07-13 VITALS — BP 185/82 | HR 56 | Temp 97.8°F | Resp 20 | Ht 66.0 in | Wt 191.0 lb

## 2012-07-13 DIAGNOSIS — C50419 Malignant neoplasm of upper-outer quadrant of unspecified female breast: Secondary | ICD-10-CM

## 2012-07-13 DIAGNOSIS — Z170 Estrogen receptor positive status [ER+]: Secondary | ICD-10-CM

## 2012-07-13 DIAGNOSIS — C773 Secondary and unspecified malignant neoplasm of axilla and upper limb lymph nodes: Secondary | ICD-10-CM

## 2012-07-13 DIAGNOSIS — Z5112 Encounter for antineoplastic immunotherapy: Secondary | ICD-10-CM

## 2012-07-13 DIAGNOSIS — T8459XA Infection and inflammatory reaction due to other internal joint prosthesis, initial encounter: Secondary | ICD-10-CM

## 2012-07-13 LAB — COMPREHENSIVE METABOLIC PANEL (CC13)
ALT: 14 U/L (ref 0–55)
AST: 18 U/L (ref 5–34)
Albumin: 3.5 g/dL (ref 3.5–5.0)
Alkaline Phosphatase: 193 U/L — ABNORMAL HIGH (ref 40–150)
BUN: 17.8 mg/dL (ref 7.0–26.0)
CO2: 29 mEq/L (ref 22–29)
Calcium: 9.8 mg/dL (ref 8.4–10.4)
Chloride: 96 mEq/L — ABNORMAL LOW (ref 98–107)
Creatinine: 1.2 mg/dL — ABNORMAL HIGH (ref 0.6–1.1)
Glucose: 118 mg/dl — ABNORMAL HIGH (ref 70–99)
Potassium: 3.6 mEq/L (ref 3.5–5.1)
Sodium: 135 mEq/L — ABNORMAL LOW (ref 136–145)
Total Bilirubin: 0.59 mg/dL (ref 0.20–1.20)
Total Protein: 7.9 g/dL (ref 6.4–8.3)

## 2012-07-13 LAB — CBC WITH DIFFERENTIAL/PLATELET
BASO%: 0.6 % (ref 0.0–2.0)
Basophils Absolute: 0 10*3/uL (ref 0.0–0.1)
EOS%: 7.1 % — ABNORMAL HIGH (ref 0.0–7.0)
Eosinophils Absolute: 0.4 10*3/uL (ref 0.0–0.5)
HCT: 35.8 % (ref 34.8–46.6)
HGB: 12.2 g/dL (ref 11.6–15.9)
LYMPH%: 18.7 % (ref 14.0–49.7)
MCH: 26.9 pg (ref 25.1–34.0)
MCHC: 34.1 g/dL (ref 31.5–36.0)
MCV: 79 fL — ABNORMAL LOW (ref 79.5–101.0)
MONO#: 0.5 10*3/uL (ref 0.1–0.9)
MONO%: 7.9 % (ref 0.0–14.0)
NEUT#: 4.1 10*3/uL (ref 1.5–6.5)
NEUT%: 65.7 % (ref 38.4–76.8)
Platelets: 228 10*3/uL (ref 145–400)
RBC: 4.53 10*6/uL (ref 3.70–5.45)
RDW: 13.2 % (ref 11.2–14.5)
WBC: 6.2 10*3/uL (ref 3.9–10.3)
lymph#: 1.2 10*3/uL (ref 0.9–3.3)

## 2012-07-13 MED ORDER — SODIUM CHLORIDE 0.9 % IV SOLN
Freq: Once | INTRAVENOUS | Status: AC
  Administered 2012-07-13: 13:00:00 via INTRAVENOUS

## 2012-07-13 MED ORDER — HEPARIN SOD (PORK) LOCK FLUSH 100 UNIT/ML IV SOLN
500.0000 [IU] | Freq: Once | INTRAVENOUS | Status: AC | PRN
  Administered 2012-07-13: 500 [IU]
  Filled 2012-07-13: qty 5

## 2012-07-13 MED ORDER — ACETAMINOPHEN 325 MG PO TABS
650.0000 mg | ORAL_TABLET | Freq: Once | ORAL | Status: AC
  Administered 2012-07-13: 650 mg via ORAL

## 2012-07-13 MED ORDER — DIPHENHYDRAMINE HCL 25 MG PO CAPS
50.0000 mg | ORAL_CAPSULE | Freq: Once | ORAL | Status: AC
  Administered 2012-07-13: 50 mg via ORAL

## 2012-07-13 MED ORDER — SODIUM CHLORIDE 0.9 % IJ SOLN
10.0000 mL | INTRAMUSCULAR | Status: DC | PRN
  Administered 2012-07-13: 10 mL
  Filled 2012-07-13: qty 10

## 2012-07-13 MED ORDER — TRASTUZUMAB CHEMO INJECTION 440 MG
6.0000 mg/kg | Freq: Once | INTRAVENOUS | Status: AC
  Administered 2012-07-13: 525 mg via INTRAVENOUS
  Filled 2012-07-13: qty 25

## 2012-07-13 NOTE — Progress Notes (Signed)
Patient with disability form from [facility]. Form forwarded to manage care.

## 2012-07-13 NOTE — Patient Instructions (Addendum)
[facility] Discharge Instructions for Patients Receiving Chemotherapy  Today you received the following chemotherapy agents herceptin   If you develop nausea and vomiting that is not controlled by your nausea medication, call the clinic. If it is after clinic hours your family physician or the after hours number for the clinic or go to the Emergency Department.   BELOW ARE SYMPTOMS THAT SHOULD BE REPORTED IMMEDIATELY:  *FEVER GREATER THAN 100.5 F  *CHILLS WITH OR WITHOUT FEVER  NAUSEA AND VOMITING THAT IS NOT CONTROLLED WITH YOUR NAUSEA MEDICATION  *UNUSUAL SHORTNESS OF BREATH  *UNUSUAL BRUISING OR BLEEDING  TENDERNESS IN MOUTH AND THROAT WITH OR WITHOUT PRESENCE OF ULCERS  *URINARY PROBLEMS  *BOWEL PROBLEMS  UNUSUAL RASH Items with * indicate a potential emergency and should be followed up as soon as possible.   Feel free to call the clinic you have any questions or concerns. The clinic phone number is [phone].   I have been informed and understand all the instructions given to me. I know to contact the clinic, my physician, or go to the Emergency Department if any problems should occur. I do not have any questions at this time, but understand that I may call the clinic during office hours   should I have any questions or need assistance in obtaining follow up care.    __________________________________________  _____________  __________ Signature of Patient or Authorized Representative            Date                   Time    __________________________________________ Nurse's Signature    

## 2012-07-13 NOTE — Patient Instructions (Addendum)
Proceed with herceptin today  We will see you back on 2/14 at which time you will receive [facility] combination chemotherapy and herceptin

## 2012-07-13 NOTE — Telephone Encounter (Signed)
gv and printed appt schedule for jan thru March

## 2012-07-18 ENCOUNTER — Other Ambulatory Visit: Payer: Self-pay | Admitting: Oncology

## 2012-07-19 ENCOUNTER — Encounter: Payer: Self-pay | Admitting: Oncology

## 2012-07-19 ENCOUNTER — Ambulatory Visit
Admission: RE | Admit: 2012-07-19 | Discharge: 2012-07-19 | Disposition: A | Discharge status: Home / Self Care | Payer: BC Managed Care – PPO | Source: Ambulatory Visit | Attending: Radiation Oncology | Admitting: Radiation Oncology

## 2012-07-19 ENCOUNTER — Encounter: Payer: Self-pay | Admitting: Radiation Oncology

## 2012-07-19 VITALS — Resp 16 | Wt 190.9 lb

## 2012-07-19 DIAGNOSIS — C50419 Malignant neoplasm of upper-outer quadrant of unspecified female breast: Secondary | ICD-10-CM

## 2012-07-19 NOTE — Progress Notes (Signed)
Radiation Oncology         [phone] ________________________________  Name: Julie Ross MRN: [id number]  Date: 07/19/2012  DOB: 04-Jul-1960  Follow-Up Visit Note  CC: Jannifer Rodney, MD  Diagnosis:   Left breast cancer  Interval Since Last Radiation:  1 months  Narrative:  The patient returns today for routine follow-up.  She continues to have a significant amount of fatigue but denies any itching or discomfort along the left chest wall area.  she denies any problems with swelling in her left arm or hand.                              ALLERGIES:   has no known allergies.  Meds: Current Outpatient Prescriptions  Medication Sig Dispense Refill  . ALPRAZolam (XANAX) 0.5 MG tablet Take 1 tablet (0.5 mg total) by mouth 3 (three) times daily as needed for anxiety.  15 tablet  0  . atenolol (TENORMIN) 25 MG tablet Take 0.5 tablets (12.5 mg total) by mouth daily. For blood pressure  15 tablet  0  . bacitracin ointment Apply topically 2 (two) times daily.  120 g    . baclofen (LIORESAL) 10 MG tablet 10 mg 3 (three) times daily.       Marland Kitchen CALCIUM-MAGNESIUM PO Take 1 tablet by mouth daily.      . cephALEXin (KEFLEX) 500 MG capsule Take 1 capsule (500 mg total) by mouth 4 (four) times daily.  120 capsule  4  . clonazePAM (KLONOPIN) 0.5 MG tablet Take 0.5 mg by mouth Nightly.      . Diclofenac-Misoprostol 75-0.2 MG TBEC 75 mg Twice daily as needed. Take 1 by mouth twice daily as needed      . docusate sodium (COLACE) 100 MG capsule Take 100 mg by mouth daily.      . DULoxetine (CYMBALTA) 60 MG capsule Take 60 mg by mouth.      . esomeprazole (NEXIUM) 40 MG capsule Take 40 mg by mouth daily before breakfast.      . gabapentin (NEURONTIN) 100 MG capsule Take 1 capsule (100 mg total) by mouth 3 (three) times daily. For neuropathy  90 capsule  1  . IRON PO Take 45 mg by mouth every other day.       . lidocaine-prilocaine (EMLA) cream APPLY AS NEEDED  30 g  1  .  losartan-hydrochlorothiazide (HYZAAR) 100-25 MG per tablet 1 tablet daily.       . Multiple Vitamins-Minerals (MULTIVITAMINS THER. W/MINERALS) TABS Take 1 tablet by mouth daily.      . ondansetron (ZOFRAN) 8 MG tablet       . Probiotic Product (PROBIOTIC DAILY PO) Take by mouth.      . prochlorperazine (COMPAZINE) 10 MG tablet TAKE 1 TABLET BY MOUTH EVERY 6 HOURS AS NEEDED FOR NAUSEA AND VOMITING  30 tablet  0  . dexamethasone (DECADRON) 4 MG tablet       . emollient (BIAFINE) cream Apply topically 2 (two) times daily.      Marland Kitchen HYDROcodone-acetaminophen (NORCO) 10-325 MG per tablet every 8 (eight) hours as needed.        No current facility-administered medications for this encounter.   Facility-Administered Medications Ordered in Other Encounters  Medication Dose Route Frequency Provider Last Rate Last Dose  . ceFAZolin (ANCEF) IVPB 2 g/50 mL premix    PRN Lance Coon, CRNA   2 g at  05/01/12 0855  . dexamethasone (DECADRON) injection    PRN Lance Coon, CRNA   8 mg at 05/01/12 0908  . fentaNYL (SUBLIMAZE) injection    PRN Lance Coon, CRNA   100 mcg at 05/01/12 0856  . lidocaine (cardiac) 100 mg/4ml (XYLOCAINE) 20 MG/ML injection 2%    PRN Lance Coon, CRNA   60 mg at 05/01/12 0856  . midazolam (VERSED) 5 MG/5ML injection    PRN Lance Coon, CRNA   2 mg at 05/01/12 0856  . propofol (DIPRIVAN) 10 mg/mL bolus/IV push    PRN Lance Coon, CRNA   160 mg at 05/01/12 0900    Physical Findings: The patient is in no acute distress. Patient is alert and oriented.  weight is 190 lb 14.4 oz (86.592 kg). Her respiration is 16. Marland Kitchen  No palpable supraclavicular or axillary adenopathy.  The lungs are clear to auscultation. The heart has regular rhythm and rate. Examination of the right breast reveals no mass or nipple discharge. Examination of the left chest wall area reveals the skin to be well-healed.   there is no palpable or visible signs recurrence.  Lab Findings: Lab Results    Component Value Date   WBC 6.2 07/13/2012   HGB 12.2 07/13/2012   HCT 35.8 07/13/2012   MCV 79.0* 07/13/2012   PLT 228 07/13/2012    @LASTCHEM @  Radiographic Findings: No results found.  Impression:  The patient is recovering from the effects of radiation.  Fatigue continues to be an issue for her.  Plan:  Routine followup in 6 months.  _____________________________________

## 2012-07-19 NOTE — Progress Notes (Signed)
Put [employer] disability form on nurse's desk. °

## 2012-07-19 NOTE — Progress Notes (Signed)
Patient presents to the clinic today for follow up with Dr. Roselind Messier. Patient is unaccompanied. Patient is alert and oriented to person, place, and time. No distress noted. Steady gait noted. Pleasant affect noted. Patient denies pain at this time. Patient reports that her left chest wall has returned to normal color, texture, and appearance. Patient denies using moisturizer daily on affected area. Patient denies palpating any lumps or bumps in her right breast or left chest wall. Patient denies nipple discharge from right breast. Patient demonstrates full ROM of both arms. Patient reports great fatigue. Patient reports that most days she goes to bed at 0100 to wake up at 1300. Patient reports energy level is getting worse instead of better. Reported all findings to Dr. Roselind Messier.

## 2012-07-23 ENCOUNTER — Telehealth (HOSPITAL_COMMUNITY): Payer: Self-pay | Admitting: *Deleted

## 2012-07-23 MED ORDER — AMLODIPINE BESYLATE 10 MG PO TABS: 10.0000 mg | ORAL_TABLET | Freq: Every day | ORAL | Status: DC

## 2012-07-23 NOTE — Telephone Encounter (Signed)
Pt had emailed BP readings, they have ranged from 130-180s over 80-110s per Dr Gala Romney add Amlodipine 10 mg pt is aware and agreeable, rx sent in

## 2012-07-24 NOTE — Progress Notes (Signed)
OFFICE PROGRESS NOTE  CC  Julie Ross [address] [place] [address] Dr. Cyndia Bent Dr. Richardean Chimera Dr. Mardella Layman Dr. Gustavus Messing ([facility])  DIAGNOSIS: 52 year old female who originally was seen in the multidisciplinary breast clinic for discussion of new diagnosis of left breast cancer. She is now status post left mastectomy with the final pathology revealing an invasive ductal carcinoma that is ER positive PR positive HER-2/neu positive with Ki-67 11%.  PRIOR THERAPY:  #1 patient was originally seen in the multidisciplinary breast clinic on 11/30/2011 for what at that time looked like a ductal carcinoma in situ of the left breast. Due to the extent of disease she was recommended a mastectomy with axillary lymph node sampling.  #2 12/19/2011 patient had a left mastectomy with sentinel lymph node biopsy. Her final pathology revealed 2 foci of invasive ductal carcinoma measuring 0.8 and 0.6 cm. The tumor was low grade ER positive PR positive. Patient's tumor was found to be HER-2/neu positive with HER-2 ratio of 3.27. Ki-67 was 11%. Patient had sentinel lymph node  Biopsies performed one of 4 lymph nodes were positive for metastatic disease. Patient's final pathologic staging is T1 N1 M0. Stage II  #3 patient has received adjuvant chemotherapy consisting of Taxotere carboplatinum and Herceptin for 2 cycles. This was then complicated by development of bacteremia left knee sepsis requiring revision of the knee. Port-A-Cath site infection requiring removal of the Port-A-Cath and placement of a PICC line.  #4 patient has been receiving Herceptin every 3 weeks with radiation therapy this is to continue until patient finishes up her antibiotics then we may consider her completing her 2 cycles of Taxotere and carboplatinum combination.  #5 patient has now completed her radiation therapy on 06/04/2012. Overall she tolerated it well.  #6  patient will now begin Taxotere carboplatinum and Herceptin combination to finish out her last 2 cycles. We discussed the risks and benefits of this again. We will plan on starting the regimen on every 14 2014.  CURRENT THERAPY:  Proceed with Herceptin only today.  INTERVAL HISTORY: Julie Ross [place] 52 y.o. female returns for Followup visit. Patient has completed the radiation therapy as of 06/04/2012. Overall she tolerated it well. Her infection is also under significantly better control. She remains on oral antibiotics. She and I. And her husband today discuss going back on chemotherapy specifically Taxotere or and carboplatinum along with Herceptin. We again discussed the potential side effects including infections. They do understand the implications. They also understand the high-risk of the nature of her disease. She otherwise feels well she has no nausea vomiting fevers chills no night sweats.    MEDICAL HISTORY: Past Medical History  Diagnosis Date  . Hypertension   . Hypothyroidism   . Arthritis   . Neuromuscular disorder     MS TX WITH CYMBALTA   . Multiple sclerosis   . H/O colonoscopy   . H/O bone density study 03/2011  . Wears glasses   . Heart burn   . Depression   . Sleep apnea     MILD SLEEP APNEA, NO MACHINE  6 YRS AGO HPT [facility]   . Breast cancer     lul/ER+PR+    ALLERGIES:   has no known allergies.  MEDICATIONS:  Current Outpatient Prescriptions  Medication Sig Dispense Refill  . ALPRAZolam (XANAX) 0.5 MG tablet Take 1 tablet (0.5 mg total) by mouth 3 (three) times daily as needed for anxiety.  15 tablet  0  . atenolol (TENORMIN) 25 MG tablet Take 0.5 tablets (12.5 mg total) by mouth daily. For blood pressure  15 tablet  0  . baclofen (LIORESAL) 10 MG tablet 10 mg 3 (three) times daily.       Marland Kitchen CALCIUM-MAGNESIUM PO Take 1 tablet by mouth daily.      . cephALEXin (KEFLEX) 500 MG capsule Take 1 capsule (500 mg total) by mouth 4 (four) times daily.  120 capsule  4   . clonazePAM (KLONOPIN) 0.5 MG tablet Take 0.5 mg by mouth Nightly.      . Diclofenac-Misoprostol 75-0.2 MG TBEC 75 mg Twice daily as needed. Take 1 by mouth twice daily as needed      . docusate sodium (COLACE) 100 MG capsule Take 100 mg by mouth daily.      . DULoxetine (CYMBALTA) 60 MG capsule Take 60 mg by mouth.      . emollient (BIAFINE) cream Apply topically 2 (two) times daily.      Marland Kitchen esomeprazole (NEXIUM) 40 MG capsule Take 40 mg by mouth daily before breakfast.      . gabapentin (NEURONTIN) 100 MG capsule Take 1 capsule (100 mg total) by mouth 3 (three) times daily. For neuropathy  90 capsule  1  . HYDROcodone-acetaminophen (NORCO) 10-325 MG per tablet every 8 (eight) hours as needed.       . IRON PO Take 45 mg by mouth every other day.       . losartan-hydrochlorothiazide (HYZAAR) 100-25 MG per tablet 1 tablet daily.       . Multiple Vitamins-Minerals (MULTIVITAMINS THER. W/MINERALS) TABS Take 1 tablet by mouth daily.      . Probiotic Product (PROBIOTIC DAILY PO) Take by mouth.      . prochlorperazine (COMPAZINE) 10 MG tablet TAKE 1 TABLET BY MOUTH EVERY 6 HOURS AS NEEDED FOR NAUSEA AND VOMITING  30 tablet  0  . amLODipine (NORVASC) 10 MG tablet Take 1 tablet (10 mg total) by mouth daily.  30 tablet  3  . bacitracin ointment Apply topically 2 (two) times daily.  120 g    . dexamethasone (DECADRON) 4 MG tablet       . lidocaine-prilocaine (EMLA) cream APPLY AS NEEDED  30 g  1  . ondansetron (ZOFRAN) 8 MG tablet        No current facility-administered medications for this visit.   Facility-Administered Medications Ordered in Other Visits  Medication Dose Route Frequency Provider Last Rate Last Dose  . ceFAZolin (ANCEF) IVPB 2 g/50 mL premix    PRN Lance Coon, CRNA   2 g at 05/01/12 0855  . dexamethasone (DECADRON) injection    PRN Lance Coon, CRNA   8 mg at 05/01/12 0908  . fentaNYL (SUBLIMAZE) injection    PRN Lance Coon, CRNA   100 mcg at 05/01/12 0856  . lidocaine  (cardiac) 100 mg/40ml (XYLOCAINE) 20 MG/ML injection 2%    PRN Lance Coon, CRNA   60 mg at 05/01/12 0856  . midazolam (VERSED) 5 MG/5ML injection    PRN Lance Coon, CRNA   2 mg at 05/01/12 0856  . propofol (DIPRIVAN) 10 mg/mL bolus/IV push    PRN Lance Coon, CRNA   160 mg at 05/01/12 0900    SURGICAL HISTORY:  Past Surgical History  Procedure Date  . Tonsillectomy   . Tumor removal     FROM PELVIS AGE 31  . Cesarean section     X2   . No past surgeries  BLADDER SLING  4-5 YRS AGO   . Knee arthroscopy     LT KNEE 04/2011  . Total knee arthroplasty 08/15/2011    Procedure: TOTAL KNEE ARTHROPLASTY; lft Surgeon: Harvie Junior, MD;  Location: [facility] OR;  Service: Orthopedics;  Laterality: Left;  RIGHT KNEE CORTIZONE INJECTION  . Mohs surgery     right face  . Knee arthroscopy 82    rt  . Joint replacement Feb 2013  . Mastectomy w/ sentinel node biopsy 12/19/2011    Procedure: MASTECTOMY WITH SENTINEL LYMPH NODE BIOPSY;  Surgeon: Currie Paris, MD;  Location: [facility] OR;  Service: General;  Laterality: Left;  left breast and left axilla   . Portacath placement 01/16/2012    Procedure: INSERTION PORT-A-CATH;  Surgeon: Currie Paris, MD;  Location: [facility];  Service: General;  Laterality: Right;  Porta Cath Placement   . I&d knee with poly exchange 03/02/2012    Procedure: IRRIGATION AND DEBRIDEMENT KNEE WITH POLY EXCHANGE;  Surgeon: Harvie Junior, MD;  Location: [facility] OR;  Service: Orthopedics;  Laterality: Left;  I&D of total knee with Possible Poly Exchange   . Tee without cardioversion 03/06/2012    Procedure: TRANSESOPHAGEAL ECHOCARDIOGRAM (TEE);  Surgeon: Wendall Stade, MD;  Location: [facility];  Service: Cardiovascular;  Laterality: N/A;  Rm. [id number]  . Port-a-cath removal 03/06/2012    Procedure: REMOVAL PORT-A-CATH;  Surgeon: Adolph Pollack, MD;  Location: [facility] OR;  Service: General;  Laterality: N/A;  . Portacath placement 05/01/2012    Procedure:  INSERTION PORT-A-CATH;  Surgeon: Currie Paris, MD;  Location: [facility];  Service: General;  Laterality: Right;  right internal jugular port-a-cath insertion    REVIEW OF SYSTEMS:   General: fatigue (+), night sweats (-), fever (-), pain (-) Lymph: palpable nodes (-) HEENT: vision changes (-), mucositis (-), gum bleeding (-), epistaxis (-) Cardiovascular: chest pain (-), palpitations (-) Pulmonary: shortness of breath (-), dyspnea on exertion (-), cough (-), hemoptysis (-) GI:  Early satiety (-), melena (-), dysphagia (-), nausea/vomiting (-), diarrhea (-) GU: dysuria (-), hematuria (-), incontinence (-) Musculoskeletal: joint swelling (-), joint pain (-), back pain (-) Neuro: weakness (-), numbness (-), headache (-), confusion (-) Skin: Rash (-), lesions (-), dryness (-) Psych: depression (-), suicidal/homicidal ideation (-), feeling of hopelessness (-)   PHYSICAL EXAMINATION:  BP 185/82  Pulse 56  Temp 97.8 F (36.6 C) (Oral)  Resp 20  Ht 5\' 6"  (1.676 m)  Wt 191 lb (86.637 kg)  BMI 30.83 kg/m2 General: Patient is a well appearing female in no acute distress HEENT: PERRLA, sclerae anicteric no conjunctival pallor, MMM Neck: supple, no palpable adenopathy Lungs: clear to auscultation bilaterally, no wheezes, rhonchi, or rales Cardiovascular: regular rate rhythm, S1, S2, no murmurs, rubs or gallops Abdomen: Soft, non-tender, non-distended, normoactive bowel sounds, no HSM Extremities: warm and well perfused, no clubbing, cyanosis, or edema Skin: No rashes or lesions Neuro: Non-focal Breasts: left chest wall mastectomy site no nodularity, erythematous from radiation.   ECOG PERFORMANCE STATUS: 1 - Symptomatic but completely ambulatory  LABORATORY DATA: Lab Results  Component Value Date   WBC 6.2 07/13/2012   HGB 12.2 07/13/2012   HCT 35.8 07/13/2012   MCV 79.0* 07/13/2012   PLT 228 07/13/2012      Chemistry      Component Value Date/Time   NA 135*  07/13/2012 1124   NA 135 04/30/2012 1530   K 3.6 07/13/2012 1124   K 4.0  04/30/2012 1530   CL 96* 07/13/2012 1124   CL 96 04/30/2012 1530   CO2 29 07/13/2012 1124   CO2 26 04/30/2012 1530   BUN 17.8 07/13/2012 1124   BUN 20 04/30/2012 1530   CREATININE 1.2* 07/13/2012 1124   CREATININE 1.10 04/30/2012 1530      Component Value Date/Time   CALCIUM 9.8 07/13/2012 1124   CALCIUM 9.8 04/30/2012 1530   ALKPHOS 193* 07/13/2012 1124   ALKPHOS 494* 03/09/2012 0535   AST 18 07/13/2012 1124   AST 18 03/09/2012 0535   ALT 14 07/13/2012 1124   ALT 11 03/09/2012 0535   BILITOT 0.59 07/13/2012 1124   BILITOT 0.2* 03/09/2012 0535     DOB: 1960-12-27 Age: 74 Gender: F Client Name [place] Collected Date: 12/19/2011 Received Date: 12/19/2011 Physician: Cyndia Bent Chart #: MRN # : [id number] Physician cc: Richardean Chimera, MD Sherian Rein, MD Race:W Visit #: [id number] Kaylyn Lim, RN REPORT OF SURGICAL PATHOLOGY ADDITIONAL INFORMATION: 5. PROGNOSTIC INDICATORS - ACIS Results IMMUNOHISTOCHEMICAL AND MORPHOMETRIC ANALYSIS BY THE AUTOMATED CELLULAR IMAGING SYSTEM (ACIS) Estrogen Receptor (Negative, <1%): 100%, STRONG STAINING INTENSITY Progesterone Receptor (Negative, <1%): 100%, STRONG STAINING INTENSITY Proliferation Marker Ki67 by M IB-1 (Low<20%): 11% All controls stained appropriately Pecola Leisure MD Pathologist, Electronic Signature ( Signed 12/29/2011) 5. CHROMOGENIC IN-SITU HYBRIDIZATION Interpretation: HER2/NEU BY CISH - SHOWS AMPLIFICATION BY CISH ANALYSIS. THE RATIO OF HER2: CEP 17 SIGNALS WAS 3.27 Reference range: Ratio: HER2:CEP17 < 1.8 gene amplification not observed Ratio: HER2:CEP 17 1.8-2.2 - equivocal result Ratio: HER2:CEP17 > 2.2 - gene amplification observed Pecola Leisure MD Pathologist, Electronic Signature ( Signed 12/27/2011) 1 of 4 FINAL for Poulson, Mairany M ([id number]) FINAL DIAGNOSIS Diagnosis 1. Lymph node, sentinel, biopsy, Left axillary - ONE  BENIGN LYMPH NODE WITH NO TUMOR SEEN (0/1). 2. Lymph node, sentinel, biopsy, Left axilla - ONE LYMPH NODE POSITIVE FOR METASTATIC DUCTAL CARCINOMA (1/1). - SEE COMMENT. 3. Lymph node, sentinel, biopsy, Left axilla - ONE BENIGN LYMPH NODE WITH NO TUMOR SEEN (0/1). 4. Lymph node, sentinel, biopsy, Left axilla - ONE BENIGN LYMPH NODE WITH NO TUMOR SEEN (0/1). 5. Breast, simple mastectomy, Left - INVASIVE GRADE I DUCTAL CARCINOMA, TWO FOCI MEASURING 0.8 CM AND 0.6 CM ARISING IN A BACKGROUND OF EXTENSIVE INTERMEDIATE GRADE DUCTAL CARCINOMA IN SITU WITH NECROSIS. - MARGINS ARE NEGATIVE. - SEE ONCOLOGY TEMPLATE. Microscopic Comment 2. On re-review of the frozen section slides, no metastatic carcinoma is identified. The carcinoma present is on permanent sections only. 5. BREAST, INVASIVE TUMOR, WITH LYMPH NODE SAMPLING Specimen, including laterality: Left breast with sentinel lymph node. Procedure: Left simple mastectomy with sentinel lymph node biopsy. Grade: I (Both foci of tumors show similar morphology). Tubule formation: 2. Nuclear pleomorphism: 2. Mitotic: 1. Tumor size (glass slide measurement): Two foci measuring 0.8 cm and 0.6 cm. Margins: Invasive, distance to closest margin: At least 1.5 cm. In-situ, distance to closest margin: At least 1.5 cm. Lymphovascular invasion: No definite lymph/vascular invasion is not identified however a sentinel lymph node is positive (see below). Ductal carcinoma in situ: Yes. Grade: Intermediate grade. Extensive intraductal component: Yes. Lobular neoplasia: Not identified. Tumor focality: Two foci. Treatment effect: Not applicable. Extent of tumor: Tumor confined to breast parenchyma. Lymph nodes: # examined: 4. Lymph nodes with metastasis: 1. Macrometastasis: (> 2.0 mm): 1. Extracapsular extension: Not identified. Breast prognostic profile: A breast prognostic profile will be performed (see below comment). Additional breast findings:  Fibrocystic changes with usual ductal hyperplasia. TNM: mpT1b, pN1a,  MX. Comment: The palpable nodular area seen grossly is comprised predominantly of intermediate grade ductal carcinoma in situ with only two foci of invasive ductal carcinoma. Both invasive foci are morphologically similar. As such a breast prognostic profile will only be performed on the larger tumor. A breast prognostic profile can be performed upon the smaller tumor per request. (RH:kh 12-21-11) 2 of 4   ASSESSMENT: 52 year old female with  #1 stage II invasive ductal carcinoma of the left breast with ductal carcinoma in situ. Patient is status post mastectomy that showed 2 foci of invasive cancer measuring 0.8 0.6 cm. The tumor is ER positive PR positive HER-2/neu amplified at 3.27. Patient is now seen in medical oncology for discussion of adjuvant treatment. As mentioned above her case was discussed at the multidisciplinary breast conference. Patient is noted to have one of 4 lymph nodes positive for metastatic disease. This was only axillary sentinel node biopsy. A discussion of whether patient under should undergo a full axillary lymph node dissection did take place but at the end of our extensive discussion it was recommended the patient will proceed with radiation and DOS she would feel the criteria of seed 11. Therefore is excellent lymph node dissection is not being done. Instead the patient will proceed with adjuvant chemotherapy and Herceptin. Her chemotherapy will consist of Taxotere carboplatinum given every 21 days. For the duration of her chemotherapy patient will get Herceptin on a weekly basis. Once she completes the chemotherapy she then will get Herceptin every 3 weeks. After completion of chemotherapy patient will proceed with radiation therapy with concurrent Herceptin. After completion of radiation she will start Herceptin and antiestrogen therapy consisting of either tamoxifen or an aromatase inhibitor.  #2  patient has now completed radiation therapy and Herceptin combination. Her radiation completed on 06/04/2012.  #3 we will reinitiate combination chemotherapy and Herceptin on 08/03/2012. The chemotherapy will consist of Taxotere or and carboplatinum with Herceptin. The Taxotere carboplatinum will be given every 21 days to complete out 2 more cycles. Herceptin will be given on a weekly basis.  PLAN:  #1 Proceed with Herceptin only today.  #2 she will return on February 14 for [facility].  All questions were answered. The patient knows to call the clinic with any problems, questions or concerns. We can certainly see the patient much sooner if necessary.  I spent 25 minutes counseling the patient face to face. The total time spent in the appointment was 30 minutes.  This case was reviewed with Dr. Welton Flakes.    Cherie Ouch Lyn Hollingshead, NP Medical Oncology [facility] Phone: [phone]   07/24/2012, 7:58 PM

## 2012-08-03 ENCOUNTER — Ambulatory Visit (HOSPITAL_BASED_OUTPATIENT_CLINIC_OR_DEPARTMENT_OTHER): Payer: BC Managed Care – PPO

## 2012-08-03 ENCOUNTER — Encounter: Payer: Self-pay | Admitting: Adult Health

## 2012-08-03 ENCOUNTER — Encounter: Payer: Self-pay | Admitting: Oncology

## 2012-08-03 ENCOUNTER — Ambulatory Visit (HOSPITAL_BASED_OUTPATIENT_CLINIC_OR_DEPARTMENT_OTHER): Payer: BC Managed Care – PPO | Admitting: Adult Health

## 2012-08-03 ENCOUNTER — Telehealth: Payer: Self-pay | Admitting: *Deleted

## 2012-08-03 ENCOUNTER — Telehealth: Payer: Self-pay | Admitting: Oncology

## 2012-08-03 ENCOUNTER — Other Ambulatory Visit (HOSPITAL_BASED_OUTPATIENT_CLINIC_OR_DEPARTMENT_OTHER): Payer: BC Managed Care – PPO | Admitting: Lab

## 2012-08-03 VITALS — BP 132/72 | HR 70 | Temp 98.2°F | Resp 18 | Ht 66.0 in | Wt 194.4 lb

## 2012-08-03 DIAGNOSIS — Z170 Estrogen receptor positive status [ER+]: Secondary | ICD-10-CM

## 2012-08-03 DIAGNOSIS — Z5112 Encounter for antineoplastic immunotherapy: Secondary | ICD-10-CM

## 2012-08-03 DIAGNOSIS — C773 Secondary and unspecified malignant neoplasm of axilla and upper limb lymph nodes: Secondary | ICD-10-CM

## 2012-08-03 DIAGNOSIS — Z5111 Encounter for antineoplastic chemotherapy: Secondary | ICD-10-CM

## 2012-08-03 DIAGNOSIS — C50419 Malignant neoplasm of upper-outer quadrant of unspecified female breast: Secondary | ICD-10-CM

## 2012-08-03 LAB — COMPREHENSIVE METABOLIC PANEL (CC13)
ALT: 105 U/L — ABNORMAL HIGH (ref 0–55)
AST: 61 U/L — ABNORMAL HIGH (ref 5–34)
Albumin: 3.6 g/dL (ref 3.5–5.0)
Alkaline Phosphatase: 332 U/L — ABNORMAL HIGH (ref 40–150)
BUN: 23.9 mg/dL (ref 7.0–26.0)
CO2: 24 mEq/L (ref 22–29)
Calcium: 10 mg/dL (ref 8.4–10.4)
Chloride: 97 mEq/L — ABNORMAL LOW (ref 98–107)
Creatinine: 1.4 mg/dL — ABNORMAL HIGH (ref 0.6–1.1)
Glucose: 186 mg/dl — ABNORMAL HIGH (ref 70–99)
Potassium: 3.9 mEq/L (ref 3.5–5.1)
Sodium: 133 mEq/L — ABNORMAL LOW (ref 136–145)
Total Bilirubin: 0.36 mg/dL (ref 0.20–1.20)
Total Protein: 8.1 g/dL (ref 6.4–8.3)

## 2012-08-03 LAB — CBC WITH DIFFERENTIAL/PLATELET
BASO%: 0 % (ref 0.0–2.0)
Basophils Absolute: 0 10*3/uL (ref 0.0–0.1)
EOS%: 0 % (ref 0.0–7.0)
Eosinophils Absolute: 0 10*3/uL (ref 0.0–0.5)
HCT: 32.8 % — ABNORMAL LOW (ref 34.8–46.6)
HGB: 11.1 g/dL — ABNORMAL LOW (ref 11.6–15.9)
LYMPH%: 5.4 % — ABNORMAL LOW (ref 14.0–49.7)
MCH: 26.7 pg (ref 25.1–34.0)
MCHC: 33.8 g/dL (ref 31.5–36.0)
MCV: 79 fL — ABNORMAL LOW (ref 79.5–101.0)
MONO#: 0.2 10*3/uL (ref 0.1–0.9)
MONO%: 2.1 % (ref 0.0–14.0)
NEUT#: 10.4 10*3/uL — ABNORMAL HIGH (ref 1.5–6.5)
NEUT%: 92.5 % — ABNORMAL HIGH (ref 38.4–76.8)
Platelets: 297 10*3/uL (ref 145–400)
RBC: 4.15 10*6/uL (ref 3.70–5.45)
RDW: 13.3 % (ref 11.2–14.5)
WBC: 11.2 10*3/uL — ABNORMAL HIGH (ref 3.9–10.3)
lymph#: 0.6 10*3/uL — ABNORMAL LOW (ref 0.9–3.3)
nRBC: 0 % (ref 0–0)

## 2012-08-03 LAB — TECHNOLOGIST REVIEW

## 2012-08-03 MED ORDER — DIPHENHYDRAMINE HCL 25 MG PO CAPS
50.0000 mg | ORAL_CAPSULE | Freq: Once | ORAL | Status: AC
  Administered 2012-08-03: 50 mg via ORAL

## 2012-08-03 MED ORDER — DOCETAXEL CHEMO INJECTION 160 MG/16ML
75.0000 mg/m2 | Freq: Once | INTRAVENOUS | Status: AC
  Administered 2012-08-03: 150 mg via INTRAVENOUS
  Filled 2012-08-03: qty 15

## 2012-08-03 MED ORDER — SODIUM CHLORIDE 0.9 % IV SOLN
560.0000 mg | Freq: Once | INTRAVENOUS | Status: AC
  Administered 2012-08-03: 560 mg via INTRAVENOUS
  Filled 2012-08-03: qty 56

## 2012-08-03 MED ORDER — TRASTUZUMAB CHEMO INJECTION 440 MG
2.0000 mg/kg | Freq: Once | INTRAVENOUS | Status: AC
  Administered 2012-08-03: 168 mg via INTRAVENOUS
  Filled 2012-08-03: qty 8

## 2012-08-03 MED ORDER — ACETAMINOPHEN 325 MG PO TABS
650.0000 mg | ORAL_TABLET | Freq: Once | ORAL | Status: AC
  Administered 2012-08-03: 650 mg via ORAL

## 2012-08-03 MED ORDER — DEXAMETHASONE SODIUM PHOSPHATE 4 MG/ML IJ SOLN
20.0000 mg | Freq: Once | INTRAMUSCULAR | Status: AC
  Administered 2012-08-03: 20 mg via INTRAVENOUS

## 2012-08-03 MED ORDER — ONDANSETRON 16 MG/50ML IVPB (CHCC)
16.0000 mg | Freq: Once | INTRAVENOUS | Status: AC
  Administered 2012-08-03: 16 mg via INTRAVENOUS

## 2012-08-03 NOTE — Telephone Encounter (Signed)
Per staff message and POF I have scheduled appts.  JMW  

## 2012-08-03 NOTE — Patient Instructions (Addendum)
[facility] Discharge Instructions for Patients Receiving Chemotherapy  Today you received the following chemotherapy agents: Taxotere, Carboplatin, and Herceptin.    To help prevent nausea and vomiting after your treatment, we encourage you to take your nausea medication.  If you develop nausea and vomiting that is not controlled by your nausea medication, call the clinic. If it is after clinic hours your family physician or the after hours number for the clinic or go to the Emergency Department.   BELOW ARE SYMPTOMS THAT SHOULD BE REPORTED IMMEDIATELY:  *FEVER GREATER THAN 100.5 F  *CHILLS WITH OR WITHOUT FEVER  NAUSEA AND VOMITING THAT IS NOT CONTROLLED WITH YOUR NAUSEA MEDICATION  *UNUSUAL SHORTNESS OF BREATH  *UNUSUAL BRUISING OR BLEEDING  TENDERNESS IN MOUTH AND THROAT WITH OR WITHOUT PRESENCE OF ULCERS  *URINARY PROBLEMS  *BOWEL PROBLEMS  UNUSUAL RASH Items with * indicate a potential emergency and should be followed up as soon as possible.  Feel free to call the clinic you have any questions or concerns. The clinic phone number is [phone].

## 2012-08-03 NOTE — Patient Instructions (Addendum)
Doing well.  Proceed with therapy, I will see you back in one week for Herceptin, labs and an appointment.  Please call us if you have any questions or concerns.

## 2012-08-03 NOTE — Progress Notes (Signed)
OFFICE PROGRESS NOTE  CC  Julie Ross [address] [place] [address] Dr. Cyndia Bent Dr. Richardean Chimera Dr. Mardella Layman Dr. Gustavus Messing ([facility])  DIAGNOSIS: 52 year old female who originally was seen in the multidisciplinary breast clinic for discussion of new diagnosis of left breast cancer. She is now status post left mastectomy with the final pathology revealing an invasive ductal carcinoma that is ER positive PR positive HER-2/neu positive with Ki-67 11%.  PRIOR THERAPY:  #1 patient was originally seen in the multidisciplinary breast clinic on 11/30/2011 for what at that time looked like a ductal carcinoma in situ of the left breast. Due to the extent of disease she was recommended a mastectomy with axillary lymph node sampling.  #2 12/19/2011 patient had a left mastectomy with sentinel lymph node biopsy. Her final pathology revealed 2 foci of invasive ductal carcinoma measuring 0.8 and 0.6 cm. The tumor was low grade ER positive PR positive. Patient's tumor was found to be HER-2/neu positive with HER-2 ratio of 3.27. Ki-67 was 11%. Patient had sentinel lymph node  Biopsies performed one of 4 lymph nodes were positive for metastatic disease. Patient's final pathologic staging is T1 N1 M0. Stage II  #3 patient has received adjuvant chemotherapy consisting of Taxotere carboplatinum and Herceptin for 2 cycles. This was then complicated by development of bacteremia left knee sepsis requiring revision of the knee. Port-A-Cath site infection requiring removal of the Port-A-Cath and placement of a PICC line.  #4 patient has been receiving Herceptin every 3 weeks with radiation therapy this is to continue until patient finishes up her antibiotics then we may consider her completing her 2 cycles of Taxotere and carboplatinum combination.  #5 patient has now completed her radiation therapy on 06/04/2012. Overall she tolerated it well.  #6  patient will now begin Taxotere carboplatinum and Herceptin combination to finish out her last 2 cycles. We discussed the risks and benefits of this again. We will plan on starting the regimen on February, 14 2014.  CURRENT THERAPY:  TCH (restart) cycle 1 day 1  INTERVAL HISTORY: Julie Ross 52 y.o. female returns for Followup visit. She denies fevers, chills, or any other concerns.  She is experiencing some joint pain in her right knee due to the weather.  She is experiencing insomnia and suffering from a sleep schedule of 1am to 1pm.  Otherwise, she's feeling well and a 10 point ROS is neg.   MEDICAL HISTORY: Past Medical History  Diagnosis Date  . Hypertension   . Hypothyroidism   . Arthritis   . Neuromuscular disorder     MS TX WITH CYMBALTA   . Multiple sclerosis   . H/O colonoscopy   . H/O bone density study 03/2011  . Wears glasses   . Heart burn   . Depression   . Sleep apnea     MILD SLEEP APNEA, NO MACHINE  6 YRS AGO HPT [facility]   . Breast cancer     lul/ER+PR+    ALLERGIES:  has No Known Allergies.  MEDICATIONS:  Current Outpatient Prescriptions  Medication Sig Dispense Refill  . amLODipine (NORVASC) 10 MG tablet Take 1 tablet (10 mg total) by mouth daily.  30 tablet  3  . atenolol (TENORMIN) 25 MG tablet Take 0.5 tablets (12.5 mg total) by mouth daily. For blood pressure  15 tablet  0  . baclofen (LIORESAL) 10 MG tablet 10 mg 3 (three) times daily.       Marland Kitchen  CALCIUM-MAGNESIUM PO Take 1 tablet by mouth daily.      . cephALEXin (KEFLEX) 500 MG capsule Take 1 capsule (500 mg total) by mouth 4 (four) times daily.  120 capsule  4  . clonazePAM (KLONOPIN) 0.5 MG tablet Take 0.5 mg by mouth Nightly.      Marland Kitchen dexamethasone (DECADRON) 4 MG tablet       . Diclofenac-Misoprostol 75-0.2 MG TBEC 75 mg Twice daily as needed. Take 1 by mouth twice daily as needed      . docusate sodium (COLACE) 100 MG capsule Take 100 mg by mouth daily.      . DULoxetine (CYMBALTA) 60 MG capsule  Take 60 mg by mouth.      . esomeprazole (NEXIUM) 40 MG capsule Take 40 mg by mouth daily before breakfast.      . gabapentin (NEURONTIN) 100 MG capsule Take 1 capsule (100 mg total) by mouth 3 (three) times daily. For neuropathy  90 capsule  1  . IRON PO Take 45 mg by mouth every other day.       . lidocaine-prilocaine (EMLA) cream APPLY AS NEEDED  30 g  1  . losartan-hydrochlorothiazide (HYZAAR) 100-25 MG per tablet 1 tablet daily.       . Multiple Vitamins-Minerals (MULTIVITAMINS THER. W/MINERALS) TABS Take 1 tablet by mouth daily.      . Probiotic Product (PROBIOTIC DAILY PO) Take by mouth.      . prochlorperazine (COMPAZINE) 10 MG tablet TAKE 1 TABLET BY MOUTH EVERY 6 HOURS AS NEEDED FOR NAUSEA AND VOMITING  30 tablet  0  . ALPRAZolam (XANAX) 0.5 MG tablet Take 1 tablet (0.5 mg total) by mouth 3 (three) times daily as needed for anxiety.  15 tablet  0  . bacitracin ointment Apply topically 2 (two) times daily.  120 g    . emollient (BIAFINE) cream Apply topically 2 (two) times daily.      Marland Kitchen HYDROcodone-acetaminophen (NORCO) 10-325 MG per tablet every 8 (eight) hours as needed.       . ondansetron (ZOFRAN) 8 MG tablet        No current facility-administered medications for this visit.   Facility-Administered Medications Ordered in Other Visits  Medication Dose Route Frequency Provider Last Rate Last Dose  . CARBOplatin (PARAPLATIN) 560 mg in sodium chloride 0.9 % 250 mL chemo infusion  560 mg Intravenous Once Victorino December, MD      . ceFAZolin (ANCEF) IVPB 2 g/50 mL premix    PRN Lance Coon, CRNA   2 g at 05/01/12 0855  . dexamethasone (DECADRON) injection    PRN Lance Coon, CRNA   8 mg at 05/01/12 0908  . DOCEtaxel (TAXOTERE) 150 mg in dextrose 5 % 250 mL chemo infusion  75 mg/m2 (Treatment Plan Actual) Intravenous Once Victorino December, MD      . fentaNYL (SUBLIMAZE) injection    PRN Lance Coon, CRNA   100 mcg at 05/01/12 0856  . lidocaine (cardiac) 100 mg/88ml (XYLOCAINE) 20  MG/ML injection 2%    PRN Lance Coon, CRNA   60 mg at 05/01/12 0856  . midazolam (VERSED) 5 MG/5ML injection    PRN Lance Coon, CRNA   2 mg at 05/01/12 0856  . propofol (DIPRIVAN) 10 mg/mL bolus/IV push    PRN Lance Coon, CRNA   160 mg at 05/01/12 0900  . trastuzumab (HERCEPTIN) 168 mg in sodium chloride 0.9 % 250 mL chemo infusion  2 mg/kg (Treatment Plan Actual) Intravenous Once  Victorino December, MD        SURGICAL HISTORY:  Past Surgical History  Procedure Laterality Date  . Tonsillectomy    . Tumor removal      FROM PELVIS AGE 56  . Cesarean section      X2   . No past surgeries      BLADDER SLING  4-5 YRS AGO   . Knee arthroscopy      LT KNEE 04/2011  . Total knee arthroplasty  08/15/2011    Procedure: TOTAL KNEE ARTHROPLASTY; lft Surgeon: Harvie Junior, MD;  Location: [facility] OR;  Service: Orthopedics;  Laterality: Left;  RIGHT KNEE CORTIZONE INJECTION  . Mohs surgery      right face  . Knee arthroscopy  82    rt  . Joint replacement  Feb 2013  . Mastectomy w/ sentinel node biopsy  12/19/2011    Procedure: MASTECTOMY WITH SENTINEL LYMPH NODE BIOPSY;  Surgeon: Currie Paris, MD;  Location: [facility] OR;  Service: General;  Laterality: Left;  left breast and left axilla   . Portacath placement  01/16/2012    Procedure: INSERTION PORT-A-CATH;  Surgeon: Currie Paris, MD;  Location: [facility];  Service: General;  Laterality: Right;  Porta Cath Placement   . I&d knee with poly exchange  03/02/2012    Procedure: IRRIGATION AND DEBRIDEMENT KNEE WITH POLY EXCHANGE;  Surgeon: Harvie Junior, MD;  Location: [facility] OR;  Service: Orthopedics;  Laterality: Left;  I&D of total knee with Possible Poly Exchange   . Tee without cardioversion  03/06/2012    Procedure: TRANSESOPHAGEAL ECHOCARDIOGRAM (TEE);  Surgeon: Wendall Stade, MD;  Location: [facility] END[facility]COPY;  Service: Cardiovascular;  Laterality: N/A;  Rm. [id number]  . Port-a-cath removal  03/06/2012    Procedure: REMOVAL PORT-A-CATH;   Surgeon: Adolph Pollack, MD;  Location: [facility] OR;  Service: General;  Laterality: N/A;  . Portacath placement  05/01/2012    Procedure: INSERTION PORT-A-CATH;  Surgeon: Currie Paris, MD;  Location: [facility];  Service: General;  Laterality: Right;  right internal jugular port-a-cath insertion    REVIEW OF SYSTEMS:   General: fatigue (+), night sweats (-), fever (-), pain (-) Lymph: palpable nodes (-) HEENT: vision changes (-), mucositis (-), gum bleeding (-), epistaxis (-) Cardiovascular: chest pain (-), palpitations (-) Pulmonary: shortness of breath (-), dyspnea on exertion (-), cough (-), hemoptysis (-) GI:  Early satiety (-), melena (-), dysphagia (-), nausea/vomiting (-), diarrhea (-) GU: dysuria (-), hematuria (-), incontinence (-) Musculoskeletal: joint swelling (-), joint pain (-), back pain (-) Neuro: weakness (-), numbness (-), headache (-), confusion (-) Skin: Rash (-), lesions (-), dryness (-) Psych: depression (-), suicidal/homicidal ideation (-), feeling of hopelessness (-)   PHYSICAL EXAMINATION:  BP 132/72  Pulse 70  Temp(Src) 98.2 F (36.8 C) (Oral)  Resp 18  Ht 5\' 6"  (1.676 m)  Wt 194 lb 7 oz (88.196 kg)  BMI 31.4 kg/m2 General: Patient is a well appearing female in no acute distress HEENT: PERRLA, sclerae anicteric no conjunctival pallor, MMM Neck: supple, no palpable adenopathy Lungs: clear to auscultation bilaterally, no wheezes, rhonchi, or rales Cardiovascular: regular rate rhythm, S1, S2, no murmurs, rubs or gallops Abdomen: Soft, non-tender, non-distended, normoactive bowel sounds, no HSM Extremities: warm and well perfused, no clubbing, cyanosis, or edema Skin: No rashes or lesions Neuro: Non-focal Breasts: left chest wall mastectomy site no nodularity, erythematous from radiation.   ECOG PERFORMANCE STATUS: 1 - Symptomatic but  completely ambulatory  LABORATORY DATA: Lab Results  Component Value Date   WBC 11.2* 08/03/2012    HGB 11.1* 08/03/2012   HCT 32.8* 08/03/2012   MCV 79.0* 08/03/2012   PLT 297 08/03/2012      Chemistry      Component Value Date/Time   NA 135* 07/13/2012 1124   NA 135 04/30/2012 1530   K 3.6 07/13/2012 1124   K 4.0 04/30/2012 1530   CL 96* 07/13/2012 1124   CL 96 04/30/2012 1530   CO2 29 07/13/2012 1124   CO2 26 04/30/2012 1530   BUN 17.8 07/13/2012 1124   BUN 20 04/30/2012 1530   CREATININE 1.2* 07/13/2012 1124   CREATININE 1.10 04/30/2012 1530      Component Value Date/Time   CALCIUM 9.8 07/13/2012 1124   CALCIUM 9.8 04/30/2012 1530   ALKPHOS 193* 07/13/2012 1124   ALKPHOS 494* 03/09/2012 0535   AST 18 07/13/2012 1124   AST 18 03/09/2012 0535   ALT 14 07/13/2012 1124   ALT 11 03/09/2012 0535   BILITOT 0.59 07/13/2012 1124   BILITOT 0.2* 03/09/2012 0535     DOB: 08-19-1960 Age: 46 Gender: F Client Name [place] Collected Date: 12/19/2011 Received Date: 12/19/2011 Physician: Cyndia Bent Chart #: MRN # : [id number] Physician cc: Richardean Chimera, MD Sherian Rein, MD Race:W Visit #: [id number] Kaylyn Lim, RN REPORT OF SURGICAL PATHOLOGY ADDITIONAL INFORMATION: 5. PROGNOSTIC INDICATORS - ACIS Results IMMUNOHISTOCHEMICAL AND MORPHOMETRIC ANALYSIS BY THE AUTOMATED CELLULAR IMAGING SYSTEM (ACIS) Estrogen Receptor (Negative, <1%): 100%, STRONG STAINING INTENSITY Progesterone Receptor (Negative, <1%): 100%, STRONG STAINING INTENSITY Proliferation Marker Ki67 by M IB-1 (Low<20%): 11% All controls stained appropriately Pecola Leisure MD Pathologist, Electronic Signature ( Signed 12/29/2011) 5. CHROMOGENIC IN-SITU HYBRIDIZATION Interpretation: HER2/NEU BY CISH - SHOWS AMPLIFICATION BY CISH ANALYSIS. THE RATIO OF HER2: CEP 17 SIGNALS WAS 3.27 Reference range: Ratio: HER2:CEP17 < 1.8 gene amplification not observed Ratio: HER2:CEP 17 1.8-2.2 - equivocal result Ratio: HER2:CEP17 > 2.2 - gene amplification observed Pecola Leisure MD Pathologist, Electronic Signature (  Signed 12/27/2011) 1 of 4 FINAL for Henton, Nari M ([id number]) FINAL DIAGNOSIS Diagnosis 1. Lymph node, sentinel, biopsy, Left axillary - ONE BENIGN LYMPH NODE WITH NO TUMOR SEEN (0/1). 2. Lymph node, sentinel, biopsy, Left axilla - ONE LYMPH NODE POSITIVE FOR METASTATIC DUCTAL CARCINOMA (1/1). - SEE COMMENT. 3. Lymph node, sentinel, biopsy, Left axilla - ONE BENIGN LYMPH NODE WITH NO TUMOR SEEN (0/1). 4. Lymph node, sentinel, biopsy, Left axilla - ONE BENIGN LYMPH NODE WITH NO TUMOR SEEN (0/1). 5. Breast, simple mastectomy, Left - INVASIVE GRADE I DUCTAL CARCINOMA, TWO FOCI MEASURING 0.8 CM AND 0.6 CM ARISING IN A BACKGROUND OF EXTENSIVE INTERMEDIATE GRADE DUCTAL CARCINOMA IN SITU WITH NECROSIS. - MARGINS ARE NEGATIVE. - SEE ONCOLOGY TEMPLATE. Microscopic Comment 2. On re-review of the frozen section slides, no metastatic carcinoma is identified. The carcinoma present is on permanent sections only. 5. BREAST, INVASIVE TUMOR, WITH LYMPH NODE SAMPLING Specimen, including laterality: Left breast with sentinel lymph node. Procedure: Left simple mastectomy with sentinel lymph node biopsy. Grade: I (Both foci of tumors show similar morphology). Tubule formation: 2. Nuclear pleomorphism: 2. Mitotic: 1. Tumor size (glass slide measurement): Two foci measuring 0.8 cm and 0.6 cm. Margins: Invasive, distance to closest margin: At least 1.5 cm. In-situ, distance to closest margin: At least 1.5 cm. Lymphovascular invasion: No definite lymph/vascular invasion is not identified however a sentinel lymph node is positive (see below). Ductal carcinoma in situ:  Yes. Grade: Intermediate grade. Extensive intraductal component: Yes. Lobular neoplasia: Not identified. Tumor focality: Two foci. Treatment effect: Not applicable. Extent of tumor: Tumor confined to breast parenchyma. Lymph nodes: # examined: 4. Lymph nodes with metastasis: 1. Macrometastasis: (> 2.0 mm): 1. Extracapsular  extension: Not identified. Breast prognostic profile: A breast prognostic profile will be performed (see below comment). Additional breast findings: Fibrocystic changes with usual ductal hyperplasia. TNM: mpT1b, pN1a, MX. Comment: The palpable nodular area seen grossly is comprised predominantly of intermediate grade ductal carcinoma in situ with only two foci of invasive ductal carcinoma. Both invasive foci are morphologically similar. As such a breast prognostic profile will only be performed on the larger tumor. A breast prognostic profile can be performed upon the smaller tumor per request. (RH:kh 12-21-11) 2 of 4   ASSESSMENT: 52 year old female with  #1 stage II invasive ductal carcinoma of the left breast with ductal carcinoma in situ. Patient is status post mastectomy that showed 2 foci of invasive cancer measuring 0.8 0.6 cm. The tumor is ER positive PR positive HER-2/neu amplified at 3.27. Patient is now seen in medical oncology for discussion of adjuvant treatment. As mentioned above her case was discussed at the multidisciplinary breast conference. Patient is noted to have one of 4 lymph nodes positive for metastatic disease. This was only axillary sentinel node biopsy. A discussion of whether patient under should undergo a full axillary lymph node dissection did take place but at the end of our extensive discussion it was recommended the patient will proceed with radiation and DOS she would feel the criteria of seed 11. Therefore is excellent lymph node dissection is not being done. Instead the patient will proceed with adjuvant chemotherapy and Herceptin. Her chemotherapy will consist of Taxotere carboplatinum given every 21 days. For the duration of her chemotherapy patient will get Herceptin on a weekly basis. Once she completes the chemotherapy she then will get Herceptin every 3 weeks. After completion of chemotherapy patient will proceed with radiation therapy with concurrent Herceptin.  After completion of radiation she will start Herceptin and antiestrogen therapy consisting of either tamoxifen or an aromatase inhibitor.  #2 patient has now completed radiation therapy and Herceptin combination. Her radiation completed on 06/04/2012.  #3 we will reinitiate combination chemotherapy and Herceptin on 08/03/2012. The chemotherapy will consist of Taxotere or and carboplatinum with Herceptin. The Taxotere carboplatinum will be given every 21 days to complete out 2 more cycles. Herceptin will be given on a weekly basis.  PLAN:  #1 Proceed with TCH today.  We will arrange her weekly Herceptin.    #2 she will return on February 21 for weekly Herceptin.    All questions were answered. The patient knows to call the clinic with any problems, questions or concerns. We can certainly see the patient much sooner if necessary.  I spent 25 minutes counseling the patient face to face. The total time spent in the appointment was 30 minutes.  This case was reviewed with Dr. Welton Flakes.    Cherie Ouch Lyn Hollingshead, NP Medical Oncology [facility] Phone: [phone]   08/03/2012, 1:37 PM

## 2012-08-03 NOTE — Telephone Encounter (Signed)
, °

## 2012-08-04 ENCOUNTER — Ambulatory Visit (HOSPITAL_BASED_OUTPATIENT_CLINIC_OR_DEPARTMENT_OTHER): Payer: BC Managed Care – PPO

## 2012-08-04 VITALS — BP 134/71 | HR 54 | Temp 97.8°F

## 2012-08-04 DIAGNOSIS — C773 Secondary and unspecified malignant neoplasm of axilla and upper limb lymph nodes: Secondary | ICD-10-CM

## 2012-08-04 DIAGNOSIS — C50419 Malignant neoplasm of upper-outer quadrant of unspecified female breast: Secondary | ICD-10-CM

## 2012-08-04 DIAGNOSIS — Z5189 Encounter for other specified aftercare: Secondary | ICD-10-CM

## 2012-08-04 MED ORDER — PEGFILGRASTIM INJECTION 6 MG/0.6ML
6.0000 mg | Freq: Once | SUBCUTANEOUS | Status: AC
  Administered 2012-08-04: 6 mg via SUBCUTANEOUS

## 2012-08-05 ENCOUNTER — Other Ambulatory Visit: Payer: Self-pay | Admitting: Oncology

## 2012-08-08 ENCOUNTER — Other Ambulatory Visit: Payer: Self-pay | Admitting: *Deleted

## 2012-08-08 ENCOUNTER — Telehealth: Payer: Self-pay | Admitting: *Deleted

## 2012-08-08 DIAGNOSIS — E860 Dehydration: Secondary | ICD-10-CM

## 2012-08-08 DIAGNOSIS — C50419 Malignant neoplasm of upper-outer quadrant of unspecified female breast: Secondary | ICD-10-CM

## 2012-08-08 MED ORDER — SODIUM CHLORIDE 0.9 % IV SOLN: Freq: Once | INTRAVENOUS | Status: DC

## 2012-08-08 NOTE — Telephone Encounter (Signed)
Spoke with Augustin Schooling, NP, she agrees that patient should be scheduled to come in for fluids. Appt for 3pm given to this nurse from Raymondo Band, RN, patient notified of time.

## 2012-08-08 NOTE — Telephone Encounter (Signed)
Patient calling into triage, she rec'd 1st chemo (Taxotere/Carbo) on Friday, Neulasta injection on Saturday. She did fine on Sunday, but since Monday, she has been having knee pain on a previously injured knee, then she has taken Hydrocodone for pain. She is getting dizzy upon standing and notes that her pulse has been up to 140. Patient admits that she has not been eating or drinking well and may be getting dehydrated. Informed her that the pain meds could also make her light headed if not used to taking on a regular basis. Patient admits to mild nausea, but denies vomiting. She knows she has not been getting anywhere near 64oz of water a day. Patient does have Compazine for nausea, and I encouraged her to take this so she can increase her food and fluid intake. Patient states she will get more fluids in today, she is going to see her Ortho Doctor about her knee pain at 245 and would not be able to come in for fluids today. Patient is instructed to call us back this afternoon or first thing in the morning with an update if she has been unable to make herself eat and drink, we do not want her to become anymore fluid deficient and she may need to come in tomorrow for further assessment for IVfluids. Will discuss with Dr Welton Flakes and call patient back with any change in plans.

## 2012-08-09 ENCOUNTER — Ambulatory Visit (HOSPITAL_BASED_OUTPATIENT_CLINIC_OR_DEPARTMENT_OTHER): Payer: BC Managed Care – PPO

## 2012-08-09 ENCOUNTER — Other Ambulatory Visit: Payer: Self-pay | Admitting: *Deleted

## 2012-08-09 VITALS — BP 127/71 | HR 68 | Temp 97.8°F

## 2012-08-09 DIAGNOSIS — E860 Dehydration: Secondary | ICD-10-CM

## 2012-08-09 DIAGNOSIS — C50919 Malignant neoplasm of unspecified site of unspecified female breast: Secondary | ICD-10-CM

## 2012-08-09 MED ORDER — ONDANSETRON 16 MG/50ML IVPB (CHCC)
16.0000 mg | Freq: Once | INTRAVENOUS | Status: AC
  Administered 2012-08-09: 16 mg via INTRAVENOUS

## 2012-08-09 MED ORDER — SODIUM CHLORIDE 0.9 % IV SOLN
Freq: Once | INTRAVENOUS | Status: AC
  Administered 2012-08-09: 15:00:00 via INTRAVENOUS

## 2012-08-09 MED ORDER — SODIUM CHLORIDE 0.9 % IJ SOLN
10.0000 mL | INTRAMUSCULAR | Status: DC | PRN
  Administered 2012-08-09: 10 mL via INTRAVENOUS
  Filled 2012-08-09: qty 10

## 2012-08-09 MED ORDER — HEPARIN SOD (PORK) LOCK FLUSH 100 UNIT/ML IV SOLN
500.0000 [IU] | Freq: Once | INTRAVENOUS | Status: AC
  Administered 2012-08-09: 500 [IU] via INTRAVENOUS
  Filled 2012-08-09: qty 5

## 2012-08-09 NOTE — Patient Instructions (Addendum)
Dehydration, Adult Dehydration is when you lose more fluids from the body than you take in. Vital organs like the kidneys, brain, and heart cannot function without a proper amount of fluids and salt. Any loss of fluids from the body can cause dehydration.  CAUSES   Vomiting.  Diarrhea.  Excessive sweating.  Excessive urine output.  Fever. SYMPTOMS  Mild dehydration  Thirst.  Dry lips.  Slightly dry mouth. Moderate dehydration  Very dry mouth.  Sunken eyes.  Skin does not bounce back quickly when lightly pinched and released.  Dark urine and decreased urine production.  Decreased tear production.  Headache. Severe dehydration  Very dry mouth.  Extreme thirst.  Rapid, weak pulse (more than 100 beats per minute at rest).  Cold hands and feet.  Not able to sweat in spite of heat and temperature.  Rapid breathing.  Blue lips.  Confusion and lethargy.  Difficulty being awakened.  Minimal urine production.  No tears. DIAGNOSIS  Your caregiver will diagnose dehydration based on your symptoms and your exam. Blood and urine tests will help confirm the diagnosis. The diagnostic evaluation should also identify the cause of dehydration. TREATMENT  Treatment of mild or moderate dehydration can often be done at home by increasing the amount of fluids that you drink. It is best to drink small amounts of fluid more often. Drinking too much at one time can make vomiting worse. Refer to the home care instructions below. Severe dehydration needs to be treated at the hospital where you will probably be given intravenous (IV) fluids that contain water and electrolytes. HOME CARE INSTRUCTIONS   Ask your caregiver about specific rehydration instructions.  Drink enough fluids to keep your urine clear or pale yellow.  Drink small amounts frequently if you have nausea and vomiting.  Eat as you normally do.  Avoid:  Foods or drinks high in sugar.  Carbonated  drinks.  Juice.  Extremely hot or cold fluids.  Drinks with caffeine.  Fatty, greasy foods.  Alcohol.  Tobacco.  Overeating.  Gelatin desserts.  Wash your hands well to avoid spreading bacteria and viruses.  Only take over-the-counter or prescription medicines for pain, discomfort, or fever as directed by your caregiver.  Ask your caregiver if you should continue all prescribed and over-the-counter medicines.  Keep all follow-up appointments with your caregiver. SEEK MEDICAL CARE IF:  You have abdominal pain and it increases or stays in one area (localizes).  You have a rash, stiff neck, or severe headache.  You are irritable, sleepy, or difficult to awaken.  You are weak, dizzy, or extremely thirsty. SEEK IMMEDIATE MEDICAL CARE IF:   You are unable to keep fluids down or you get worse despite treatment.  You have frequent episodes of vomiting or diarrhea.  You have blood or green matter (bile) in your vomit.  You have blood in your stool or your stool looks black and tarry.  You have not urinated in 6 to 8 hours, or you have only urinated a small amount of very dark urine.  You have a fever.  You faint. MAKE SURE YOU:   Understand these instructions.  Will watch your condition.  Will get help right away if you are not doing well or get worse. Document Released: 06/06/2005 Document Revised: 08/29/2011 Document Reviewed: 01/24/2011 Exit[employer] Patient Information 2013 [employer].  

## 2012-08-10 ENCOUNTER — Ambulatory Visit (HOSPITAL_BASED_OUTPATIENT_CLINIC_OR_DEPARTMENT_OTHER): Payer: BC Managed Care – PPO | Admitting: Adult Health

## 2012-08-10 ENCOUNTER — Telehealth: Payer: Self-pay | Admitting: Oncology

## 2012-08-10 ENCOUNTER — Ambulatory Visit (HOSPITAL_BASED_OUTPATIENT_CLINIC_OR_DEPARTMENT_OTHER): Payer: BC Managed Care – PPO

## 2012-08-10 ENCOUNTER — Other Ambulatory Visit (HOSPITAL_BASED_OUTPATIENT_CLINIC_OR_DEPARTMENT_OTHER): Payer: BC Managed Care – PPO | Admitting: Lab

## 2012-08-10 ENCOUNTER — Encounter: Payer: Self-pay | Admitting: Adult Health

## 2012-08-10 VITALS — BP 86/52 | HR 68 | Temp 98.3°F | Resp 18 | Ht 66.0 in | Wt 196.4 lb

## 2012-08-10 DIAGNOSIS — C50419 Malignant neoplasm of upper-outer quadrant of unspecified female breast: Secondary | ICD-10-CM

## 2012-08-10 DIAGNOSIS — E860 Dehydration: Secondary | ICD-10-CM

## 2012-08-10 DIAGNOSIS — Z170 Estrogen receptor positive status [ER+]: Secondary | ICD-10-CM

## 2012-08-10 DIAGNOSIS — Z5112 Encounter for antineoplastic immunotherapy: Secondary | ICD-10-CM

## 2012-08-10 DIAGNOSIS — C773 Secondary and unspecified malignant neoplasm of axilla and upper limb lymph nodes: Secondary | ICD-10-CM

## 2012-08-10 LAB — COMPREHENSIVE METABOLIC PANEL (CC13)
ALT: 136 U/L — ABNORMAL HIGH (ref 0–55)
AST: 74 U/L — ABNORMAL HIGH (ref 5–34)
Albumin: 3 g/dL — ABNORMAL LOW (ref 3.5–5.0)
Alkaline Phosphatase: 246 U/L — ABNORMAL HIGH (ref 40–150)
BUN: 30.6 mg/dL — ABNORMAL HIGH (ref 7.0–26.0)
CO2: 22 mEq/L (ref 22–29)
Calcium: 8.4 mg/dL (ref 8.4–10.4)
Chloride: 100 mEq/L (ref 98–107)
Creatinine: 1.5 mg/dL — ABNORMAL HIGH (ref 0.6–1.1)
Glucose: 129 mg/dl — ABNORMAL HIGH (ref 70–99)
Potassium: 3.8 mEq/L (ref 3.5–5.1)
Sodium: 131 mEq/L — ABNORMAL LOW (ref 136–145)
Total Bilirubin: 0.7 mg/dL (ref 0.20–1.20)
Total Protein: 6.4 g/dL (ref 6.4–8.3)

## 2012-08-10 LAB — CBC WITH DIFFERENTIAL/PLATELET
BASO%: 0.3 % (ref 0.0–2.0)
Basophils Absolute: 0 10*3/uL (ref 0.0–0.1)
EOS%: 4.3 % (ref 0.0–7.0)
Eosinophils Absolute: 0.1 10*3/uL (ref 0.0–0.5)
HCT: 28.6 % — ABNORMAL LOW (ref 34.8–46.6)
HGB: 9.5 g/dL — ABNORMAL LOW (ref 11.6–15.9)
LYMPH%: 19.4 % (ref 14.0–49.7)
MCH: 26.6 pg (ref 25.1–34.0)
MCHC: 33.2 g/dL (ref 31.5–36.0)
MCV: 80.1 fL (ref 79.5–101.0)
MONO#: 0.5 10*3/uL (ref 0.1–0.9)
MONO%: 18.1 % — ABNORMAL HIGH (ref 0.0–14.0)
NEUT#: 1.7 10*3/uL (ref 1.5–6.5)
NEUT%: 57.9 % (ref 38.4–76.8)
Platelets: 156 10*3/uL (ref 145–400)
RBC: 3.57 10*6/uL — ABNORMAL LOW (ref 3.70–5.45)
RDW: 13.8 % (ref 11.2–14.5)
WBC: 3 10*3/uL — ABNORMAL LOW (ref 3.9–10.3)
lymph#: 0.6 10*3/uL — ABNORMAL LOW (ref 0.9–3.3)

## 2012-08-10 MED ORDER — DIPHENHYDRAMINE HCL 25 MG PO CAPS
50.0000 mg | ORAL_CAPSULE | Freq: Once | ORAL | Status: AC
  Administered 2012-08-10: 50 mg via ORAL

## 2012-08-10 MED ORDER — SODIUM CHLORIDE 0.9 % IJ SOLN
10.0000 mL | INTRAMUSCULAR | Status: DC | PRN
  Filled 2012-08-10: qty 10

## 2012-08-10 MED ORDER — SODIUM CHLORIDE 0.9 % IV SOLN: Freq: Once | INTRAVENOUS | Status: DC

## 2012-08-10 MED ORDER — HEPARIN SOD (PORK) LOCK FLUSH 100 UNIT/ML IV SOLN
500.0000 [IU] | Freq: Once | INTRAVENOUS | Status: DC | PRN
Start: 2012-08-10 — End: 2012-08-10
  Filled 2012-08-10: qty 5

## 2012-08-10 MED ORDER — ACETAMINOPHEN 325 MG PO TABS
650.0000 mg | ORAL_TABLET | Freq: Once | ORAL | Status: AC
  Administered 2012-08-10: 650 mg via ORAL

## 2012-08-10 MED ORDER — TRASTUZUMAB CHEMO INJECTION 440 MG
2.0000 mg/kg | Freq: Once | INTRAVENOUS | Status: AC
  Administered 2012-08-10: 168 mg via INTRAVENOUS
  Filled 2012-08-10: qty 8

## 2012-08-10 MED ORDER — SODIUM CHLORIDE 0.9 % IV SOLN
Freq: Once | INTRAVENOUS | Status: AC
  Administered 2012-08-10: 17:00:00 via INTRAVENOUS

## 2012-08-10 MED ORDER — SODIUM CHLORIDE 0.9 % IV SOLN
Freq: Once | INTRAVENOUS | Status: AC
  Administered 2012-08-10: 16:00:00 via INTRAVENOUS

## 2012-08-10 NOTE — Patient Instructions (Addendum)
Work on increasing your fluid intake.  We will give you IV fluids today, and tomorrow in addition to your Herceptin.  Take Super B complex, one tablet daily.  Please call us if you have any questions or concerns.

## 2012-08-10 NOTE — Telephone Encounter (Signed)
gv pt appt schedule for February and March.  °

## 2012-08-10 NOTE — Patient Instructions (Addendum)
[facility] Discharge Instructions for Patients Receiving Chemotherapy  Today you received the following chemotherapy agents herceptin  To help prevent nausea and vomiting after your treatment, we encourage you to take your nausea medication  and take it as often as prescribedIf you develop nausea and vomiting that is not controlled by your nausea medication, call the clinic. If it is after clinic hours your family physician or the after hours number for the clinic or go to the Emergency Department.   BELOW ARE SYMPTOMS THAT SHOULD BE REPORTED IMMEDIATELY:  *FEVER GREATER THAN 100.5 F  *CHILLS WITH OR WITHOUT FEVER  NAUSEA AND VOMITING THAT IS NOT CONTROLLED WITH YOUR NAUSEA MEDICATION  *UNUSUAL SHORTNESS OF BREATH  *UNUSUAL BRUISING OR BLEEDING  TENDERNESS IN MOUTH AND THROAT WITH OR WITHOUT PRESENCE OF ULCERS  *URINARY PROBLEMS  *BOWEL PROBLEMS  UNUSUAL RASH Items with * indicate a potential emergency and should be followed up as soon as possible.  One of the nurses will contact you 24 hours after your treatment. Please let the nurse know about any problems that you may have experienced. Feel free to call the clinic you have any questions or concerns. The clinic phone number is [phone].   I have been informed and understand all the instructions given to me. I know to contact the clinic, my physician, or go to the Emergency Department if any problems should occur. I do not have any questions at this time, but understand that I may call the clinic during office hours   should I have any questions or need assistance in obtaining follow up care.    __________________________________________  _____________  __________ Signature of Patient or Authorized Representative            Date                   Time    __________________________________________ Nurse's Signature    

## 2012-08-10 NOTE — Progress Notes (Signed)
Pt given two liter of ns, tolerated well.  dmr

## 2012-08-10 NOTE — Progress Notes (Signed)
OFFICE PROGRESS NOTE  CC  [name] [address] [place] [address] Dr. Cyndia Bent Dr. Richardean Chimera Dr. Mardella Layman Dr. Gustavus Messing ([facility])  DIAGNOSIS: 52 year old female who originally was seen in the multidisciplinary breast clinic for discussion of new diagnosis of left breast cancer. She is now status post left mastectomy with the final pathology revealing an invasive ductal carcinoma that is ER positive PR positive HER-2/neu positive with Ki-67 11%.  PRIOR THERAPY:  #1 patient was originally seen in the multidisciplinary breast clinic on 11/30/2011 for what at that time looked like a ductal carcinoma in situ of the left breast. Due to the extent of disease she was recommended a mastectomy with axillary lymph node sampling.  #2 12/19/2011 patient had a left mastectomy with sentinel lymph node biopsy. Her final pathology revealed 2 foci of invasive ductal carcinoma measuring 0.8 and 0.6 cm. The tumor was low grade ER positive PR positive. Patient's tumor was found to be HER-2/neu positive with HER-2 ratio of 3.27. Ki-67 was 11%. Patient had sentinel lymph node  Biopsies performed one of 4 lymph nodes were positive for metastatic disease. Patient's final pathologic staging is T1 N1 M0. Stage II  #3 patient has received adjuvant chemotherapy consisting of Taxotere carboplatinum and Herceptin for 2 cycles. This was then complicated by development of bacteremia left knee sepsis requiring revision of the knee. Port-A-Cath site infection requiring removal of the Port-A-Cath and placement of a PICC line.  #4 patient has been receiving Herceptin every 3 weeks with radiation therapy this is to continue until patient finishes up her antibiotics then we may consider her completing her 2 cycles of Taxotere and carboplatinum combination.  #5 patient has now completed her radiation therapy on 06/04/2012. Overall she tolerated it well.  #6  patient will now begin Taxotere carboplatinum and Herceptin combination to finish out her last 2 cycles. We discussed the risks and benefits of this again. We will plan on starting the regimen on February, 14 2014.  CURRENT THERAPY:  TCH (restart) cycle 1 day 8  INTERVAL HISTORY: Julie Ross 52 y.o. female returns for Followup visit after her first cycle of TCH restart.  She is day 8 of chemotherapy.  She has had very decreased PO intake.  She has been drinking around 30 oz of fluid per day.  She got dizzy Wednesday afternoon, had an elevated HR and called the clinic, she came in yesterday for IV fluids.  Her blood pressure is low today.  She denies fevers, increased pain in her knees, or any other concerns.  She did have intermittent numbness on Monday that is now gone.   MEDICAL HISTORY: Past Medical History  Diagnosis Date  . Hypertension   . Hypothyroidism   . Arthritis   . Neuromuscular disorder     MS TX WITH CYMBALTA   . Multiple sclerosis   . H/O colonoscopy   . H/O bone density study 03/2011  . Wears glasses   . Heart burn   . Depression   . Sleep apnea     MILD SLEEP APNEA, NO MACHINE  6 YRS AGO HPT [facility]   . Breast cancer     lul/ER+PR+    ALLERGIES:  has No Known Allergies.  MEDICATIONS:  Current Outpatient Prescriptions  Medication Sig Dispense Refill  . ALPRAZolam (XANAX) 0.5 MG tablet Take 1 tablet (0.5 mg total) by mouth 3 (three) times daily as needed for anxiety.  15 tablet  0  . amLODipine (NORVASC) 10 MG tablet Take 1 tablet (10 mg total) by mouth daily.  30 tablet  3  . atenolol (TENORMIN) 25 MG tablet Take 0.5 tablets (12.5 mg total) by mouth daily. For blood pressure  15 tablet  0  . baclofen (LIORESAL) 10 MG tablet 10 mg 3 (three) times daily.       Marland Kitchen CALCIUM-MAGNESIUM PO Take 1 tablet by mouth daily.      . cephALEXin (KEFLEX) 500 MG capsule Take 1 capsule (500 mg total) by mouth 4 (four) times daily.  120 capsule  4  . clonazePAM (KLONOPIN) 0.5 MG  tablet Take 0.5 mg by mouth Nightly.      Marland Kitchen dexamethasone (DECADRON) 4 MG tablet       . Diclofenac-Misoprostol 75-0.2 MG TBEC 75 mg Twice daily as needed. Take 1 by mouth twice daily as needed      . docusate sodium (COLACE) 100 MG capsule Take 100 mg by mouth daily.      . DULoxetine (CYMBALTA) 60 MG capsule Take 60 mg by mouth.      . emollient (BIAFINE) cream Apply topically 2 (two) times daily.      Marland Kitchen esomeprazole (NEXIUM) 40 MG capsule Take 40 mg by mouth daily before breakfast.      . gabapentin (NEURONTIN) 100 MG capsule Take 1 capsule (100 mg total) by mouth 3 (three) times daily. For neuropathy  90 capsule  1  . HYDROcodone-acetaminophen (NORCO) 10-325 MG per tablet every 8 (eight) hours as needed.       . IRON PO Take 45 mg by mouth every other day.       . lidocaine-prilocaine (EMLA) cream APPLY AS NEEDED  30 g  1  . losartan-hydrochlorothiazide (HYZAAR) 100-25 MG per tablet 1 tablet daily.       . Multiple Vitamins-Minerals (MULTIVITAMINS THER. W/MINERALS) TABS Take 1 tablet by mouth daily.      . ondansetron (ZOFRAN) 8 MG tablet       . Probiotic Product (PROBIOTIC DAILY PO) Take by mouth.      . prochlorperazine (COMPAZINE) 10 MG tablet TAKE 1 TABLET BY MOUTH EVERY 6 HOURS AS NEEDED FOR NAUSEA AND VOMITING  30 tablet  0   No current facility-administered medications for this visit.   Facility-Administered Medications Ordered in Other Visits  Medication Dose Route Frequency Provider Last Rate Last Dose  . [START ON 08/11/2012] 0.9 %  sodium chloride infusion   Intravenous Once Augustin Schooling, NP      . 0.9 %  sodium chloride infusion   Intravenous Once Augustin Schooling, NP      . ceFAZolin (ANCEF) IVPB 2 g/50 mL premix    PRN Lance Coon, CRNA   2 g at 05/01/12 0855  . dexamethasone (DECADRON) injection    PRN Lance Coon, CRNA   8 mg at 05/01/12 0908  . fentaNYL (SUBLIMAZE) injection    PRN Lance Coon, CRNA   100 mcg at 05/01/12 0856  . heparin lock flush 100  unit/mL  500 Units Intracatheter Once PRN Augustin Schooling, NP      . lidocaine (cardiac) 100 mg/27ml (XYLOCAINE) 20 MG/ML injection 2%    PRN Lance Coon, CRNA   60 mg at 05/01/12 0856  . midazolam (VERSED) 5 MG/5ML injection    PRN Lance Coon, CRNA   2 mg at 05/01/12 0856  . propofol (DIPRIVAN) 10 mg/mL bolus/IV push    PRN Lance Coon, CRNA   160 mg  at 05/01/12 0900  . sodium chloride 0.9 % injection 10 mL  10 mL Intracatheter PRN Augustin Schooling, NP      . trastuzumab (HERCEPTIN) 168 mg in sodium chloride 0.9 % 250 mL chemo infusion  2 mg/kg (Treatment Plan Actual) Intravenous Once Augustin Schooling, NP 516 mL/hr at 08/10/12 1559 168 mg at 08/10/12 1559    SURGICAL HISTORY:  Past Surgical History  Procedure Laterality Date  . Tonsillectomy    . Tumor removal      FROM PELVIS AGE 6  . Cesarean section      X2   . No past surgeries      BLADDER SLING  4-5 YRS AGO   . Knee arthroscopy      LT KNEE 04/2011  . Total knee arthroplasty  08/15/2011    Procedure: TOTAL KNEE ARTHROPLASTY; lft Surgeon: Harvie Junior, MD;  Location: [facility] OR;  Service: Orthopedics;  Laterality: Left;  RIGHT KNEE CORTIZONE INJECTION  . Mohs surgery      right face  . Knee arthroscopy  82    rt  . Joint replacement  Feb 2013  . Mastectomy w/ sentinel node biopsy  12/19/2011    Procedure: MASTECTOMY WITH SENTINEL LYMPH NODE BIOPSY;  Surgeon: Currie Paris, MD;  Location: [facility] OR;  Service: General;  Laterality: Left;  left breast and left axilla   . Portacath placement  01/16/2012    Procedure: INSERTION PORT-A-CATH;  Surgeon: Currie Paris, MD;  Location: [facility];  Service: General;  Laterality: Right;  Porta Cath Placement   . I&d knee with poly exchange  03/02/2012    Procedure: IRRIGATION AND DEBRIDEMENT KNEE WITH POLY EXCHANGE;  Surgeon: Harvie Junior, MD;  Location: [facility] OR;  Service: Orthopedics;  Laterality: Left;  I&D of total knee with Possible Poly Exchange   . Tee  without cardioversion  03/06/2012    Procedure: TRANSESOPHAGEAL ECHOCARDIOGRAM (TEE);  Surgeon: Wendall Stade, MD;  Location[facility] END[facility]COPY;  Service: Cardiovascular;  Laterality: N/A;  Rm. [id number]  . Port-a-cath removal  03/06/2012    Procedure: REMOVAL PORT-A-CATH;  Surgeon: Adolph Pollack, MD;  Location: [facility] OR;  Service: General;  Laterality: N/A;  . Portacath placement  05/01/2012    Procedure: INSERTION PORT-A-CATH;  Surgeon: Currie Paris, MD;  Location: [facility];  Service: General;  Laterality: Right;  right internal jugular port-a-cath insertion    REVIEW OF SYSTEMS:   General: fatigue (+), night sweats (-), fever (-), pain (-) Lymph: palpable nodes (-) HEENT: vision changes (-), mucositis (-), gum bleeding (-), epistaxis (-) Cardiovascular: chest pain (-), palpitations (-) Pulmonary: shortness of breath (-), dyspnea on exertion (-), cough (-), hemoptysis (-) GI:  Early satiety (-), melena (-), dysphagia (-), nausea/vomiting (-), diarrhea (-) GU: dysuria (-), hematuria (-), incontinence (-) Musculoskeletal: joint swelling (-), joint pain (-), back pain (-) Neuro: weakness (-), numbness (-), headache (-), confusion (-) Skin: Rash (-), lesions (-), dryness (-) Psych: depression (-), suicidal/homicidal ideation (-), feeling of hopelessness (-)   PHYSICAL EXAMINATION:  BP 86/52  Pulse 68  Temp(Src) 98.3 F (36.8 C) (Oral)  Resp 18  Ht 5\' 6"  (1.676 m)  Wt 196 lb 6.4 oz (89.086 kg)  BMI 31.71 kg/m2 General: Patient is a well appearing female in no acute distress HEENT: PERRLA, sclerae anicteric no conjunctival pallor, MMM Neck: supple, no palpable adenopathy Lungs: clear to auscultation bilaterally, no wheezes, rhonchi, or rales Cardiovascular: regular rate rhythm, S1,  S2, no murmurs, rubs or gallops Abdomen: Soft, non-tender, non-distended, normoactive bowel sounds, no HSM Extremities: warm and well perfused, no clubbing, cyanosis, or edema Skin: No rashes  or lesions Neuro: Non-focal Breasts: left chest wall mastectomy site no nodularity, erythematous from radiation.   ECOG PERFORMANCE STATUS: 1 - Symptomatic but completely ambulatory  LABORATORY DATA: Lab Results  Component Value Date   WBC 3.0* 08/10/2012   HGB 9.5* 08/10/2012   HCT 28.6* 08/10/2012   MCV 80.1 08/10/2012   PLT 156 08/10/2012      Chemistry      Component Value Date/Time   NA 133* 08/03/2012 1053   NA 135 04/30/2012 1530   K 3.9 08/03/2012 1053   K 4.0 04/30/2012 1530   CL 97* 08/03/2012 1053   CL 96 04/30/2012 1530   CO2 24 08/03/2012 1053   CO2 26 04/30/2012 1530   BUN 23.9 08/03/2012 1053   BUN 20 04/30/2012 1530   CREATININE 1.4* 08/03/2012 1053   CREATININE 1.10 04/30/2012 1530      Component Value Date/Time   CALCIUM 10.0 08/03/2012 1053   CALCIUM 9.8 04/30/2012 1530   ALKPHOS 332* 08/03/2012 1053   ALKPHOS 494* 03/09/2012 0535   AST 61* 08/03/2012 1053   AST 18 03/09/2012 0535   ALT 105* 08/03/2012 1053   ALT 11 03/09/2012 0535   BILITOT 0.36 08/03/2012 1053   BILITOT 0.2* 03/09/2012 0535     DOB: [birth year] Age: 63 Gender: F Client Name [place] Collected Date: 12/19/2011 Received Date: 12/19/2011 Physician: Cyndia Bent Chart #: MRN # : [id number] Physician cc: Richardean Chimera, MD Sherian Rein, MD Race:W Visit #: [id number] Kaylyn Lim, RN REPORT OF SURGICAL PATHOLOGY ADDITIONAL INFORMATION: 5. PROGNOSTIC INDICATORS - ACIS Results IMMUNOHISTOCHEMICAL AND MORPHOMETRIC ANALYSIS BY THE AUTOMATED CELLULAR IMAGING SYSTEM (ACIS) Estrogen Receptor (Negative, <1%): 100%, STRONG STAINING INTENSITY Progesterone Receptor (Negative, <1%): 100%, STRONG STAINING INTENSITY Proliferation Marker Ki67 by M IB-1 (Low<20%): 11% All controls stained appropriately Pecola Leisure MD Pathologist, Electronic Signature ( Signed 12/29/2011) 5. CHROMOGENIC IN-SITU HYBRIDIZATION Interpretation: HER2/NEU BY CISH - SHOWS AMPLIFICATION BY CISH ANALYSIS. THE  RATIO OF HER2: CEP 17 SIGNALS WAS 3.27 Reference range: Ratio: HER2:CEP17 < 1.8 gene amplification not observed Ratio: HER2:CEP 17 1.8-2.2 - equivocal result Ratio: HER2:CEP17 > 2.2 - gene amplification observed Pecola Leisure MD Pathologist, Electronic Signature ( Signed 12/27/2011) 1 of 4 FINAL for [place], Aide M ([id number]) FINAL DIAGNOSIS Diagnosis 1. Lymph node, sentinel, biopsy, Left axillary - ONE BENIGN LYMPH NODE WITH NO TUMOR SEEN (0/1). 2. Lymph node, sentinel, biopsy, Left axilla - ONE LYMPH NODE POSITIVE FOR METASTATIC DUCTAL CARCINOMA (1/1). - SEE COMMENT. 3. Lymph node, sentinel, biopsy, Left axilla - ONE BENIGN LYMPH NODE WITH NO TUMOR SEEN (0/1). 4. Lymph node, sentinel, biopsy, Left axilla - ONE BENIGN LYMPH NODE WITH NO TUMOR SEEN (0/1). 5. Breast, simple mastectomy, Left - INVASIVE GRADE I DUCTAL CARCINOMA, TWO FOCI MEASURING 0.8 CM AND 0.6 CM ARISING IN A BACKGROUND OF EXTENSIVE INTERMEDIATE GRADE DUCTAL CARCINOMA IN SITU WITH NECROSIS. - MARGINS ARE NEGATIVE. - SEE ONCOLOGY TEMPLATE. Microscopic Comment 2. On re-review of the frozen section slides, no metastatic carcinoma is identified. The carcinoma present is on permanent sections only. 5. BREAST, INVASIVE TUMOR, WITH LYMPH NODE SAMPLING Specimen, including laterality: Left breast with sentinel lymph node. Procedure: Left simple mastectomy with sentinel lymph node biopsy. Grade: I (Both foci of tumors show similar morphology). Tubule formation: 2. Nuclear pleomorphism: 2. Mitotic: 1. Tumor size (  glass slide measurement): Two foci measuring 0.8 cm and 0.6 cm. Margins: Invasive, distance to closest margin: At least 1.5 cm. In-situ, distance to closest margin: At least 1.5 cm. Lymphovascular invasion: No definite lymph/vascular invasion is not identified however a sentinel lymph node is positive (see below). Ductal carcinoma in situ: Yes. Grade: Intermediate grade. Extensive intraductal component:  Yes. Lobular neoplasia: Not identified. Tumor focality: Two foci. Treatment effect: Not applicable. Extent of tumor: Tumor confined to breast parenchyma. Lymph nodes: # examined: 4. Lymph nodes with metastasis: 1. Macrometastasis: (> 2.0 mm): 1. Extracapsular extension: Not identified. Breast prognostic profile: A breast prognostic profile will be performed (see below comment). Additional breast findings: Fibrocystic changes with usual ductal hyperplasia. TNM: mpT1b, pN1a, MX. Comment: The palpable nodular area seen grossly is comprised predominantly of intermediate grade ductal carcinoma in situ with only two foci of invasive ductal carcinoma. Both invasive foci are morphologically similar. As such a breast prognostic profile will only be performed on the larger tumor. A breast prognostic profile can be performed upon the smaller tumor per request. (RH:kh 12-21-11) 2 of 4   ASSESSMENT: 52 year old female with  #1 stage II invasive ductal carcinoma of the left breast with ductal carcinoma in situ. Patient is status post mastectomy that showed 2 foci of invasive cancer measuring 0.8 0.6 cm. The tumor is ER positive PR positive HER-2/neu amplified at 3.27. Patient is now seen in medical oncology for discussion of adjuvant treatment. As mentioned above her case was discussed at the multidisciplinary breast conference. Patient is noted to have one of 4 lymph nodes positive for metastatic disease. This was only axillary sentinel node biopsy. A discussion of whether patient under should undergo a full axillary lymph node dissection did take place but at the end of our extensive discussion it was recommended the patient will proceed with radiation and DOS she would feel the criteria of seed 11. Therefore is excellent lymph node dissection is not being done. Instead the patient will proceed with adjuvant chemotherapy and Herceptin. Her chemotherapy will consist of Taxotere carboplatinum given every 21  days. For the duration of her chemotherapy patient will get Herceptin on a weekly basis. Once she completes the chemotherapy she then will get Herceptin every 3 weeks. After completion of chemotherapy patient will proceed with radiation therapy with concurrent Herceptin. After completion of radiation she will start Herceptin and antiestrogen therapy consisting of either tamoxifen or an aromatase inhibitor.  #2 patient has now completed radiation therapy and Herceptin combination. Her radiation completed on 06/04/2012.  #3 we will reinitiate combination chemotherapy and Herceptin on 08/03/2012. The chemotherapy will consist of Taxotere or and carboplatinum with Herceptin. The Taxotere carboplatinum will be given every 21 days to complete out 2 more cycles. Herceptin will be given on a weekly basis.  PLAN:  #1 Julie Ross is doing well.  She will proceed with herceptin today  #2 She is very dehydrated.  We will give her IV fluids today and tomorrow.  She doesn't feel dizzy or lightheaded anymore.  She denies fevers, but knows to call if she develops any.   #3 she will return on February 21 for weekly Herceptin.    All questions were answered. The patient knows to call the clinic with any problems, questions or concerns. We can certainly see the patient much sooner if necessary.  I spent 25 minutes counseling the patient face to face. The total time spent in the appointment was 30 minutes.  This case was reviewed with  Dr. Welton Flakes.    Cherie Ouch Lyn Hollingshead, NP Medical Oncology [facility] Phone: [phone]   08/10/2012, 4:02 PM

## 2012-08-11 ENCOUNTER — Telehealth: Payer: Self-pay | Admitting: *Deleted

## 2012-08-11 ENCOUNTER — Ambulatory Visit: Payer: BC Managed Care – PPO

## 2012-08-11 NOTE — Telephone Encounter (Signed)
Patient called and stated that she was feeling better. She canceled her fluid appt for today.  JMW

## 2012-08-17 ENCOUNTER — Ambulatory Visit (HOSPITAL_BASED_OUTPATIENT_CLINIC_OR_DEPARTMENT_OTHER): Payer: BC Managed Care – PPO

## 2012-08-17 ENCOUNTER — Encounter: Payer: Self-pay | Admitting: Oncology

## 2012-08-17 ENCOUNTER — Ambulatory Visit (HOSPITAL_BASED_OUTPATIENT_CLINIC_OR_DEPARTMENT_OTHER): Payer: BC Managed Care – PPO | Admitting: Oncology

## 2012-08-17 ENCOUNTER — Other Ambulatory Visit (HOSPITAL_BASED_OUTPATIENT_CLINIC_OR_DEPARTMENT_OTHER): Payer: BC Managed Care – PPO | Admitting: Lab

## 2012-08-17 VITALS — BP 107/58 | HR 79 | Temp 97.7°F | Resp 20 | Ht 66.0 in | Wt 196.1 lb

## 2012-08-17 DIAGNOSIS — C773 Secondary and unspecified malignant neoplasm of axilla and upper limb lymph nodes: Secondary | ICD-10-CM

## 2012-08-17 DIAGNOSIS — C50419 Malignant neoplasm of upper-outer quadrant of unspecified female breast: Secondary | ICD-10-CM

## 2012-08-17 DIAGNOSIS — Z5112 Encounter for antineoplastic immunotherapy: Secondary | ICD-10-CM

## 2012-08-17 DIAGNOSIS — C50412 Malignant neoplasm of upper-outer quadrant of left female breast: Secondary | ICD-10-CM

## 2012-08-17 DIAGNOSIS — Z170 Estrogen receptor positive status [ER+]: Secondary | ICD-10-CM

## 2012-08-17 LAB — COMPREHENSIVE METABOLIC PANEL (CC13)
ALT: 91 U/L — ABNORMAL HIGH (ref 0–55)
AST: 35 U/L — ABNORMAL HIGH (ref 5–34)
Albumin: 2.8 g/dL — ABNORMAL LOW (ref 3.5–5.0)
Alkaline Phosphatase: 632 U/L — ABNORMAL HIGH (ref 40–150)
BUN: 17.6 mg/dL (ref 7.0–26.0)
CO2: 25 mEq/L (ref 22–29)
Calcium: 9.3 mg/dL (ref 8.4–10.4)
Chloride: 100 mEq/L (ref 98–107)
Creatinine: 1.3 mg/dL — ABNORMAL HIGH (ref 0.6–1.1)
Glucose: 123 mg/dl — ABNORMAL HIGH (ref 70–99)
Potassium: 3.6 mEq/L (ref 3.5–5.1)
Sodium: 137 mEq/L (ref 136–145)
Total Bilirubin: 0.3 mg/dL (ref 0.20–1.20)
Total Protein: 7 g/dL (ref 6.4–8.3)

## 2012-08-17 LAB — CBC WITH DIFFERENTIAL/PLATELET
BASO%: 0.2 % (ref 0.0–2.0)
Basophils Absolute: 0 10*3/uL (ref 0.0–0.1)
EOS%: 0.6 % (ref 0.0–7.0)
Eosinophils Absolute: 0.1 10*3/uL (ref 0.0–0.5)
HCT: 29.2 % — ABNORMAL LOW (ref 34.8–46.6)
HGB: 9.6 g/dL — ABNORMAL LOW (ref 11.6–15.9)
LYMPH%: 11.9 % — ABNORMAL LOW (ref 14.0–49.7)
MCH: 26.8 pg (ref 25.1–34.0)
MCHC: 32.9 g/dL (ref 31.5–36.0)
MCV: 81.6 fL (ref 79.5–101.0)
MONO#: 0.3 10*3/uL (ref 0.1–0.9)
MONO%: 3.2 % (ref 0.0–14.0)
NEUT#: 6.8 10*3/uL — ABNORMAL HIGH (ref 1.5–6.5)
NEUT%: 84.1 % — ABNORMAL HIGH (ref 38.4–76.8)
Platelets: 193 10*3/uL (ref 145–400)
RBC: 3.58 10*6/uL — ABNORMAL LOW (ref 3.70–5.45)
RDW: 14.3 % (ref 11.2–14.5)
WBC: 8.1 10*3/uL (ref 3.9–10.3)
lymph#: 1 10*3/uL (ref 0.9–3.3)
nRBC: 0 % (ref 0–0)

## 2012-08-17 MED ORDER — DIPHENHYDRAMINE HCL 25 MG PO CAPS
50.0000 mg | ORAL_CAPSULE | Freq: Once | ORAL | Status: AC
  Administered 2012-08-17: 50 mg via ORAL

## 2012-08-17 MED ORDER — TRASTUZUMAB CHEMO INJECTION 440 MG
2.0000 mg/kg | Freq: Once | INTRAVENOUS | Status: AC
  Administered 2012-08-17: 168 mg via INTRAVENOUS
  Filled 2012-08-17: qty 8

## 2012-08-17 MED ORDER — SODIUM CHLORIDE 0.9 % IV SOLN
Freq: Once | INTRAVENOUS | Status: AC
  Administered 2012-08-17: 13:00:00 via INTRAVENOUS

## 2012-08-17 MED ORDER — HEPARIN SOD (PORK) LOCK FLUSH 100 UNIT/ML IV SOLN
500.0000 [IU] | Freq: Once | INTRAVENOUS | Status: AC | PRN
  Administered 2012-08-17: 500 [IU]
  Filled 2012-08-17: qty 5

## 2012-08-17 MED ORDER — ACETAMINOPHEN 325 MG PO TABS
650.0000 mg | ORAL_TABLET | Freq: Once | ORAL | Status: AC
  Administered 2012-08-17: 650 mg via ORAL

## 2012-08-17 MED ORDER — SODIUM CHLORIDE 0.9 % IJ SOLN
10.0000 mL | INTRAMUSCULAR | Status: DC | PRN
  Administered 2012-08-17: 10 mL
  Filled 2012-08-17: qty 10

## 2012-08-17 NOTE — Patient Instructions (Addendum)
[facility] Discharge Instructions for Patients Receiving Chemotherapy  Today you received the following chemotherapy agents Herceptin.  To help prevent nausea and vomiting after your treatment, we encourage you to take your nausea medication.   If you develop nausea and vomiting that is not controlled by your nausea medication, call the clinic. If it is after clinic hours your family physician or the after hours number for the clinic or go to the Emergency Department.   BELOW ARE SYMPTOMS THAT SHOULD BE REPORTED IMMEDIATELY:  *FEVER GREATER THAN 100.5 F  *CHILLS WITH OR WITHOUT FEVER  NAUSEA AND VOMITING THAT IS NOT CONTROLLED WITH YOUR NAUSEA MEDICATION  *UNUSUAL SHORTNESS OF BREATH  *UNUSUAL BRUISING OR BLEEDING  TENDERNESS IN MOUTH AND THROAT WITH OR WITHOUT PRESENCE OF ULCERS  *URINARY PROBLEMS  *BOWEL PROBLEMS  UNUSUAL RASH Items with * indicate a potential emergency and should be followed up as soon as possible.  One of the nurses will contact you 24 hours after your treatment. Please let the nurse know about any problems that you may have experienced. Feel free to call the clinic you have any questions or concerns. The clinic phone number is [phone].   I have been informed and understand all the instructions given to me. I know to contact the clinic, my physician, or go to the Emergency Department if any problems should occur. I do not have any questions at this time, but understand that I may call the clinic during office hours   should I have any questions or need assistance in obtaining follow up care.    __________________________________________  _____________  __________ Signature of Patient or Authorized Representative            Date                   Time    __________________________________________ Nurse's Signature    

## 2012-08-17 NOTE — Patient Instructions (Addendum)
Proceed withwith Herceptin today  i will see you back in 1 week for herceptin only

## 2012-08-17 NOTE — Progress Notes (Signed)
OFFICE PROGRESS NOTE  CC  Julie Ross [address] [place] [address] Dr. Cyndia Bent Dr. Richardean Chimera Dr. Mardella Layman Dr. Gustavus Messing ([facility])  DIAGNOSIS: 52 year old female who originally was seen in the multidisciplinary breast clinic for discussion of new diagnosis of left breast cancer. She is now status post left mastectomy with the final pathology revealing an invasive ductal carcinoma that is ER positive PR positive HER-2/neu positive with Ki-67 11%.  PRIOR THERAPY:  #1 patient was originally seen in the multidisciplinary breast clinic on 11/30/2011 for what at that time looked like a ductal carcinoma in situ of the left breast. Due to the extent of disease she was recommended a mastectomy with axillary lymph node sampling.  #2 12/19/2011 patient had a left mastectomy with sentinel lymph node biopsy. Her final pathology revealed 2 foci of invasive ductal carcinoma measuring 0.8 and 0.6 cm. The tumor was low grade ER positive PR positive. Patient's tumor was found to be HER-2/neu positive with HER-2 ratio of 3.27. Ki-67 was 11%. Patient had sentinel lymph node  Biopsies performed one of 4 lymph nodes were positive for metastatic disease. Patient's final pathologic staging is T1 N1 M0. Stage II  #3 patient has received adjuvant chemotherapy consisting of Taxotere carboplatinum and Herceptin for 2 cycles. This was then complicated by development of bacteremia left knee sepsis requiring revision of the knee. Port-A-Cath site infection requiring removal of the Port-A-Cath and placement of a PICC line.  #4 patient has been receiving Herceptin every 3 weeks with radiation therapy this is to continue until patient finishes up her antibiotics then we may consider her completing her 2 cycles of Taxotere and carboplatinum combination.  #5 patient has now completed her radiation therapy on 06/04/2012. Overall she tolerated it well.  #6  patient will now begin Taxotere carboplatinum and Herceptin combination to finish out her last 2 cycles. We discussed the risks and benefits of this again. We will plan on starting the regimen on February, 14 2014.patient was begun on [facility] on 08/03/2012. With the hope of finishing of 4 cycles of TCH.  CURRENT THERAPY:  patient is here for Herceptin  INTERVAL HISTORY: Julie Ross 51 y.o. female returns for Followup visit prior to her Herceptin treatment. Unfortunately she tells me that her not right knee may be infected again there were some cultures performed of the fluid from the right and the results are pending at this time. She has begun antibiotics again. She is not in any pain. Certainly this is concerning for me since we did just reinstitute her Herceptin and on February 14. She has not had any fevers or chills.  MEDICAL HISTORY: Past Medical History  Diagnosis Date  . Hypertension   . Hypothyroidism   . Arthritis   . Neuromuscular disorder     MS TX WITH CYMBALTA   . Multiple sclerosis   . H/O colonoscopy   . H/O bone density study 03/2011  . Wears glasses   . Heart burn   . Depression   . Sleep apnea     MILD SLEEP APNEA, NO MACHINE  6 YRS AGO HPT [facility]   . Breast cancer     lul/ER+PR+    ALLERGIES:  has No Known Allergies.  MEDICATIONS:  Current Outpatient Prescriptions  Medication Sig Dispense Refill  . ALPRAZolam (XANAX) 0.5 MG tablet Take 1 tablet (0.5 mg total) by mouth 3 (three) times daily as needed for anxiety.  15 tablet  0  . amLODipine (NORVASC) 10 MG tablet Take 1 tablet (10 mg total) by mouth daily.  30 tablet  3  . atenolol (TENORMIN) 25 MG tablet Take 0.5 tablets (12.5 mg total) by mouth daily. For blood pressure  15 tablet  0  . baclofen (LIORESAL) 10 MG tablet 10 mg 3 (three) times daily.       Marland Kitchen CALCIUM-MAGNESIUM PO Take 1 tablet by mouth daily.      . clonazePAM (KLONOPIN) 0.5 MG tablet Take 0.5 mg by mouth Nightly.      Marland Kitchen dexamethasone (DECADRON)  4 MG tablet       . Diclofenac-Misoprostol 75-0.2 MG TBEC 75 mg Twice daily as needed. Take 1 by mouth twice daily as needed      . docusate sodium (COLACE) 100 MG capsule Take 100 mg by mouth daily.      . DULoxetine (CYMBALTA) 60 MG capsule Take 60 mg by mouth.      . esomeprazole (NEXIUM) 40 MG capsule Take 40 mg by mouth daily before breakfast.      . gabapentin (NEURONTIN) 100 MG capsule Take 1 capsule (100 mg total) by mouth 3 (three) times daily. For neuropathy  90 capsule  1  . HYDROcodone-acetaminophen (NORCO) 10-325 MG per tablet every 8 (eight) hours as needed.       . hydrOXYzine (ATARAX/VISTARIL) 25 MG tablet       . IRON PO Take 45 mg by mouth every other day.       . lidocaine-prilocaine (EMLA) cream APPLY AS NEEDED  30 g  1  . losartan-hydrochlorothiazide (HYZAAR) 100-25 MG per tablet 1 tablet daily.       . Multiple Vitamins-Minerals (MULTIVITAMINS THER. W/MINERALS) TABS Take 1 tablet by mouth daily.      . ondansetron (ZOFRAN) 8 MG tablet       . Probiotic Product (PROBIOTIC DAILY PO) Take by mouth.      . prochlorperazine (COMPAZINE) 10 MG tablet TAKE 1 TABLET BY MOUTH EVERY 6 HOURS AS NEEDED FOR NAUSEA AND VOMITING  30 tablet  0  . emollient (BIAFINE) cream Apply topically 2 (two) times daily.       No current facility-administered medications for this visit.   Facility-Administered Medications Ordered in Other Visits  Medication Dose Route Frequency Provider Last Rate Last Dose  . ceFAZolin (ANCEF) IVPB 2 g/50 mL premix    PRN Lance Coon, CRNA   2 g at 05/01/12 0855  . dexamethasone (DECADRON) injection    PRN Lance Coon, CRNA   8 mg at 05/01/12 0908  . fentaNYL (SUBLIMAZE) injection    PRN Lance Coon, CRNA   100 mcg at 05/01/12 0856  . lidocaine (cardiac) 100 mg/64ml (XYLOCAINE) 20 MG/ML injection 2%    PRN Lance Coon, CRNA   60 mg at 05/01/12 0856  . midazolam (VERSED) 5 MG/5ML injection    PRN Lance Coon, CRNA   2 mg at 05/01/12 0856  . propofol  (DIPRIVAN) 10 mg/mL bolus/IV push    PRN Lance Coon, CRNA   160 mg at 05/01/12 0900    SURGICAL HISTORY:  Past Surgical History  Procedure Laterality Date  . Tonsillectomy    . Tumor removal      FROM PELVIS AGE 66  . Cesarean section      X2   . No past surgeries      BLADDER SLING  4-5 YRS AGO   . Knee arthroscopy      LT KNEE  04/2011  . Total knee arthroplasty  08/15/2011    Procedure: TOTAL KNEE ARTHROPLASTY; lft Surgeon: Harvie Junior, MD;  Location: [facility] OR;  Service: Orthopedics;  Laterality: Left;  RIGHT KNEE CORTIZONE INJECTION  . Mohs surgery      right face  . Knee arthroscopy  82    rt  . Joint replacement  Feb 2013  . Mastectomy w/ sentinel node biopsy  12/19/2011    Procedure: MASTECTOMY WITH SENTINEL LYMPH NODE BIOPSY;  Surgeon: Currie Paris, MD;  Location: [facility] OR;  Service: General;  Laterality: Left;  left breast and left axilla   . Portacath placement  01/16/2012    Procedure: INSERTION PORT-A-CATH;  Surgeon: Currie Paris, MD;  Location: [facility];  Service: General;  Laterality: Right;  Porta Cath Placement   . I&d knee with poly exchange  03/02/2012    Procedure: IRRIGATION AND DEBRIDEMENT KNEE WITH POLY EXCHANGE;  Surgeon: Harvie Junior, MD;  Location: [facility] OR;  Service: Orthopedics;  Laterality: Left;  I&D of total knee with Possible Poly Exchange   . Tee without cardioversion  03/06/2012    Procedure: TRANSESOPHAGEAL ECHOCARDIOGRAM (TEE);  Surgeon: Wendall Stade, MD;  Location: [facility];  Service: Cardiovascular;  Laterality: N/A;  Rm. [id number]  . Port-a-cath removal  03/06/2012    Procedure: REMOVAL PORT-A-CATH;  Surgeon: Adolph Pollack, MD;  Location: [facility] OR;  Service: General;  Laterality: N/A;  . Portacath placement  05/01/2012    Procedure: INSERTION PORT-A-CATH;  Surgeon: Currie Paris, MD;  Location: [facility];  Service: General;  Laterality: Right;  right internal jugular port-a-cath insertion    REVIEW OF  SYSTEMS:   General: fatigue (+), night sweats (-), fever (-), pain (-) Lymph: palpable nodes (-) HEENT: vision changes (-), mucositis (-), gum bleeding (-), epistaxis (-) Cardiovascular: chest pain (-), palpitations (-) Pulmonary: shortness of breath (-), dyspnea on exertion (-), cough (-), hemoptysis (-) GI:  Early satiety (-), melena (-), dysphagia (-), nausea/vomiting (-), diarrhea (-) GU: dysuria (-), hematuria (-), incontinence (-) Musculoskeletal: joint swelling (-), joint pain (-), back pain (-) Neuro: weakness (-), numbness (-), headache (-), confusion (-) Skin: Rash (-), lesions (-), dryness (-) Psych: depression (-), suicidal/homicidal ideation (-), feeling of hopelessness (-)   PHYSICAL EXAMINATION:  BP 107/58  Pulse 79  Temp(Src) 97.7 F (36.5 C) (Oral)  Resp 20  Ht 5\' 6"  (1.676 m)  Wt 196 lb 1.6 oz (88.95 kg)  BMI 31.67 kg/m2 General: Patient is a well appearing female in no acute distress HEENT: PERRLA, sclerae anicteric no conjunctival pallor, MMM Neck: supple, no palpable adenopathy Lungs: clear to auscultation bilaterally, no wheezes, rhonchi, or rales Cardiovascular: regular rate rhythm, S1, S2, no murmurs, rubs or gallops Abdomen: Soft, non-tender, non-distended, normoactive bowel sounds, no HSM Extremities: warm and well perfused, no clubbing, cyanosis, or edema Skin: No rashes or lesions Neuro: Non-focal Breasts: left chest wall mastectomy site no nodularity, erythematous from radiation.   ECOG PERFORMANCE STATUS: 1 - Symptomatic but completely ambulatory  LABORATORY DATA: Lab Results  Component Value Date   WBC 8.1 08/17/2012   HGB 9.6* 08/17/2012   HCT 29.2* 08/17/2012   MCV 81.6 08/17/2012   PLT 193 08/17/2012      Chemistry      Component Value Date/Time   NA 131* 08/10/2012 1401   NA 135 04/30/2012 1530   K 3.8 08/10/2012 1401   K 4.0 04/30/2012 1530   CL 100  08/10/2012 1401   CL 96 04/30/2012 1530   CO2 22 08/10/2012 1401   CO2 26 04/30/2012  1530   BUN 30.6* 08/10/2012 1401   BUN 20 04/30/2012 1530   CREATININE 1.5* 08/10/2012 1401   CREATININE 1.10 04/30/2012 1530      Component Value Date/Time   CALCIUM 8.4 08/10/2012 1401   CALCIUM 9.8 04/30/2012 1530   ALKPHOS 246* 08/10/2012 1401   ALKPHOS 494* 03/09/2012 0535   AST 74* 08/10/2012 1401   AST 18 03/09/2012 0535   ALT 136* 08/10/2012 1401   ALT 11 03/09/2012 0535   BILITOT 0.70 08/10/2012 1401   BILITOT 0.2* 03/09/2012 0535     DOB: [birth year] Age: 30 Gender: F Client Name [place] Collected Date: 12/19/2011 Received Date: 12/19/2011 Physician: Cyndia Bent Chart #: MRN # : [id number] Physician cc: Richardean Chimera, MD Sherian Rein, MD Race:W Visit #: [id number] Kaylyn Lim, RN REPORT OF SURGICAL PATHOLOGY ADDITIONAL INFORMATION: 5. PROGNOSTIC INDICATORS - ACIS Results IMMUNOHISTOCHEMICAL AND MORPHOMETRIC ANALYSIS BY THE AUTOMATED CELLULAR IMAGING SYSTEM (ACIS) Estrogen Receptor (Negative, <1%): 100%, STRONG STAINING INTENSITY Progesterone Receptor (Negative, <1%): 100%, STRONG STAINING INTENSITY Proliferation Marker Ki67 by M IB-1 (Low<20%): 11% All controls stained appropriately Pecola Leisure MD Pathologist, Electronic Signature ( Signed 12/29/2011) 5. CHROMOGENIC IN-SITU HYBRIDIZATION Interpretation: HER2/NEU BY CISH - SHOWS AMPLIFICATION BY CISH ANALYSIS. THE RATIO OF HER2: CEP 17 SIGNALS WAS 3.27 Reference range: Ratio: HER2:CEP17 < 1.8 gene amplification not observed Ratio: HER2:CEP 17 1.8-2.2 - equivocal result Ratio: HER2:CEP17 > 2.2 - gene amplification observed Pecola Leisure MD Pathologist, Electronic Signature ( Signed 12/27/2011) 1 of 4 FINAL for Santoni, Nakyiah M ([id number]) FINAL DIAGNOSIS Diagnosis 1. Lymph node, sentinel, biopsy, Left axillary - ONE BENIGN LYMPH NODE WITH NO TUMOR SEEN (0/1). 2. Lymph node, sentinel, biopsy, Left axilla - ONE LYMPH NODE POSITIVE FOR METASTATIC DUCTAL CARCINOMA (1/1). - SEE COMMENT. 3. Lymph  node, sentinel, biopsy, Left axilla - ONE BENIGN LYMPH NODE WITH NO TUMOR SEEN (0/1). 4. Lymph node, sentinel, biopsy, Left axilla - ONE BENIGN LYMPH NODE WITH NO TUMOR SEEN (0/1). 5. Breast, simple mastectomy, Left - INVASIVE GRADE I DUCTAL CARCINOMA, TWO FOCI MEASURING 0.8 CM AND 0.6 CM ARISING IN A BACKGROUND OF EXTENSIVE INTERMEDIATE GRADE DUCTAL CARCINOMA IN SITU WITH NECROSIS. - MARGINS ARE NEGATIVE. - SEE ONCOLOGY TEMPLATE. Microscopic Comment 2. On re-review of the frozen section slides, no metastatic carcinoma is identified. The carcinoma present is on permanent sections only. 5. BREAST, INVASIVE TUMOR, WITH LYMPH NODE SAMPLING Specimen, including laterality: Left breast with sentinel lymph node. Procedure: Left simple mastectomy with sentinel lymph node biopsy. Grade: I (Both foci of tumors show similar morphology). Tubule formation: 2. Nuclear pleomorphism: 2. Mitotic: 1. Tumor size (glass slide measurement): Two foci measuring 0.8 cm and 0.6 cm. Margins: Invasive, distance to closest margin: At least 1.5 cm. In-situ, distance to closest margin: At least 1.5 cm. Lymphovascular invasion: No definite lymph/vascular invasion is not identified however a sentinel lymph node is positive (see below). Ductal carcinoma in situ: Yes. Grade: Intermediate grade. Extensive intraductal component: Yes. Lobular neoplasia: Not identified. Tumor focality: Two foci. Treatment effect: Not applicable. Extent of tumor: Tumor confined to breast parenchyma. Lymph nodes: # examined: 4. Lymph nodes with metastasis: 1. Macrometastasis: (> 2.0 mm): 1. Extracapsular extension: Not identified. Breast prognostic profile: A breast prognostic profile will be performed (see below comment). Additional breast findings: Fibrocystic changes with usual ductal hyperplasia. TNM: mpT1b, pN1a, MX. Comment: The palpable nodular area  seen grossly is comprised predominantly of intermediate grade ductal  carcinoma in situ with only two foci of invasive ductal carcinoma. Both invasive foci are morphologically similar. As such a breast prognostic profile will only be performed on the larger tumor. A breast prognostic profile can be performed upon the smaller tumor per request. (RH:kh 12-21-11) 2 of 4   ASSESSMENT: 52 year old female with  #1 stage II invasive ductal carcinoma of the left breast with ductal carcinoma in situ. Patient is status post mastectomy that showed 2 foci of invasive cancer measuring 0.8 0.6 cm. The tumor is ER positive PR positive HER-2/neu amplified at 3.27. Patient is now seen in medical oncology for discussion of adjuvant treatment. As mentioned above her case was discussed at the multidisciplinary breast conference. Patient is noted to have one of 4 lymph nodes positive for metastatic disease. This was only axillary sentinel node biopsy. A discussion of whether patient under should undergo a full axillary lymph node dissection did take place but at the end of our extensive discussion it was recommended the patient will proceed with radiation and DOS she would feel the criteria of seed 11. Therefore is excellent lymph node dissection is not being done. Instead the patient will proceed with adjuvant chemotherapy and Herceptin. Her chemotherapy will consist of Taxotere carboplatinum given every 21 days. For the duration of her chemotherapy patient will get Herceptin on a weekly basis. Once she completes the chemotherapy she then will get Herceptin every 3 weeks. After completion of chemotherapy patient will proceed with radiation therapy with concurrent Herceptin. After completion of radiation she will start Herceptin and antiestrogen therapy consisting of either tamoxifen or an aromatase inhibitor.  #2 patient has now completed radiation therapy and Herceptin combination. Her radiation completed on 06/04/2012.  #3 we will reinitiate combination chemotherapy and Herceptin on  08/03/2012. The chemotherapy will consist of Taxotere or and carboplatinum with Herceptin. The Taxotere carboplatinum will be given every 21 days to complete out 2 more cycles. Herceptin will be given on a weekly basis.  PLAN:  #1 Ms. Enrico is doing well.  She will proceed with herceptin today  #2 Patient will continue to see her orthopedic surgeon, Dr. Luiz Blare for her right knee.  #3 she will return in 3 weeks' time for followup and Herceptin.  All questions were answered. The patient knows to call the clinic with any problems, questions or concerns. We can certainly see the patient much sooner if necessary.  I spent 25 minutes counseling the patient face to face. The total time spent in the appointment was 30 minutes.  Drue Second, MD Medical/Oncology [facility] [phone] (beeper) [phone] (Office)  08/17/2012, 12:04 PM

## 2012-08-18 ENCOUNTER — Inpatient Hospital Stay (HOSPITAL_COMMUNITY)
Admission: EM | Admit: 2012-08-18 | Discharge: 2012-08-27 | DRG: 866 | Disposition: A | Discharge status: Home Health | Payer: BC Managed Care – PPO | Attending: Orthopedic Surgery | Admitting: Orthopedic Surgery

## 2012-08-18 ENCOUNTER — Ambulatory Visit: Admit: 2012-08-18 | Payer: Self-pay | Admitting: Orthopedic Surgery

## 2012-08-18 ENCOUNTER — Encounter (HOSPITAL_COMMUNITY): Admission: EM | Payer: Self-pay | Source: Ambulatory Visit | Attending: Orthopedic Surgery

## 2012-08-18 ENCOUNTER — Emergency Department (HOSPITAL_COMMUNITY): Payer: BC Managed Care – PPO | Admitting: Anesthesiology

## 2012-08-18 ENCOUNTER — Encounter (HOSPITAL_COMMUNITY): Payer: Self-pay | Admitting: Anesthesiology

## 2012-08-18 ENCOUNTER — Encounter (HOSPITAL_COMMUNITY)
Admission: EM | Disposition: A | Discharge status: Home Health | Payer: Self-pay | Source: Home / Self Care | Attending: Orthopedic Surgery

## 2012-08-18 ENCOUNTER — Ambulatory Visit (HOSPITAL_COMMUNITY)
Admission: EM | Admit: 2012-08-18 | Discharge: 2012-08-18 | Disposition: A | Discharge status: Other Acute Inpatient Hospital | Payer: BC Managed Care – PPO | Source: Ambulatory Visit | Attending: Orthopedic Surgery | Admitting: Orthopedic Surgery

## 2012-08-18 DIAGNOSIS — C50919 Malignant neoplasm of unspecified site of unspecified female breast: Secondary | ICD-10-CM | POA: Diagnosis present

## 2012-08-18 DIAGNOSIS — T8450XA Infection and inflammatory reaction due to unspecified internal joint prosthesis, initial encounter: Principal | ICD-10-CM | POA: Diagnosis present

## 2012-08-18 DIAGNOSIS — R7881 Bacteremia: Secondary | ICD-10-CM

## 2012-08-18 DIAGNOSIS — Y831 Surgical operation with implant of artificial internal device as the cause of abnormal reaction of the patient, or of later complication, without mention of misadventure at the time of the procedure: Secondary | ICD-10-CM | POA: Diagnosis present

## 2012-08-18 DIAGNOSIS — E039 Hypothyroidism, unspecified: Secondary | ICD-10-CM | POA: Diagnosis present

## 2012-08-18 DIAGNOSIS — Z96659 Presence of unspecified artificial knee joint: Secondary | ICD-10-CM

## 2012-08-18 DIAGNOSIS — I10 Essential (primary) hypertension: Secondary | ICD-10-CM | POA: Diagnosis present

## 2012-08-18 DIAGNOSIS — G4730 Sleep apnea, unspecified: Secondary | ICD-10-CM | POA: Diagnosis present

## 2012-08-18 DIAGNOSIS — M129 Arthropathy, unspecified: Secondary | ICD-10-CM | POA: Diagnosis present

## 2012-08-18 DIAGNOSIS — M1711 Unilateral primary osteoarthritis, right knee: Secondary | ICD-10-CM

## 2012-08-18 DIAGNOSIS — E871 Hypo-osmolality and hyponatremia: Secondary | ICD-10-CM | POA: Diagnosis not present

## 2012-08-18 DIAGNOSIS — M009 Pyogenic arthritis, unspecified: Secondary | ICD-10-CM | POA: Diagnosis present

## 2012-08-18 DIAGNOSIS — D62 Acute posthemorrhagic anemia: Secondary | ICD-10-CM | POA: Diagnosis not present

## 2012-08-18 DIAGNOSIS — A4901 Methicillin susceptible Staphylococcus aureus infection, unspecified site: Secondary | ICD-10-CM | POA: Diagnosis present

## 2012-08-18 DIAGNOSIS — T8454XA Infection and inflammatory reaction due to internal left knee prosthesis, initial encounter: Secondary | ICD-10-CM

## 2012-08-18 DIAGNOSIS — G35 Multiple sclerosis: Secondary | ICD-10-CM | POA: Diagnosis present

## 2012-08-18 DIAGNOSIS — M1712 Unilateral primary osteoarthritis, left knee: Secondary | ICD-10-CM

## 2012-08-18 HISTORY — PX: TOTAL KNEE ARTHROPLASTY: SHX125

## 2012-08-18 SURGERY — ARTHROPLASTY, KNEE, TOTAL
Anesthesia: General | Site: Knee | Laterality: Left | Wound class: Dirty or Infected

## 2012-08-18 SURGERY — CANCELLED PROCEDURE

## 2012-08-18 MED ORDER — ONDANSETRON HCL 4 MG PO TABS: 4.0000 mg | ORAL_TABLET | Freq: Four times a day (QID) | ORAL | Status: DC | PRN

## 2012-08-18 MED ORDER — BACLOFEN 10 MG PO TABS
10.0000 mg | ORAL_TABLET | Freq: Three times a day (TID) | ORAL | Status: DC
  Administered 2012-08-18 – 2012-08-27 (×26): 10 mg via ORAL
  Filled 2012-08-18 (×28): qty 1

## 2012-08-18 MED ORDER — METOCLOPRAMIDE HCL 10 MG PO TABS: 5.0000 mg | ORAL_TABLET | Freq: Three times a day (TID) | ORAL | Status: DC | PRN

## 2012-08-18 MED ORDER — KCL IN DEXTROSE-NACL 20-5-0.45 MEQ/L-%-% IV SOLN
INTRAVENOUS | Status: DC
  Administered 2012-08-18: 23:00:00 via INTRAVENOUS
  Filled 2012-08-18 (×4): qty 1000

## 2012-08-18 MED ORDER — LOSARTAN POTASSIUM-HCTZ 100-25 MG PO TABS: 1.0000 | ORAL_TABLET | Freq: Every day | ORAL | Status: DC

## 2012-08-18 MED ORDER — PHENOL 1.4 % MT LIQD: 1.0000 | OROMUCOSAL | Status: DC | PRN

## 2012-08-18 MED ORDER — LOSARTAN POTASSIUM 50 MG PO TABS
100.0000 mg | ORAL_TABLET | Freq: Every day | ORAL | Status: DC
  Administered 2012-08-19 – 2012-08-27 (×9): 100 mg via ORAL
  Filled 2012-08-18 (×9): qty 2

## 2012-08-18 MED ORDER — HYDROMORPHONE HCL PF 1 MG/ML IJ SOLN
INTRAMUSCULAR | Status: AC
  Administered 2012-08-18: 1 mg
  Filled 2012-08-18: qty 1

## 2012-08-18 MED ORDER — OXYCODONE HCL 5 MG PO TABS
5.0000 mg | ORAL_TABLET | ORAL | Status: DC | PRN
  Administered 2012-08-18 – 2012-08-20 (×6): 10 mg via ORAL
  Administered 2012-08-22 (×2): 5 mg via ORAL
  Administered 2012-08-23 – 2012-08-24 (×6): 10 mg via ORAL
  Administered 2012-08-24 – 2012-08-25 (×2): 5 mg via ORAL
  Administered 2012-08-25: 10 mg via ORAL
  Administered 2012-08-25: 5 mg via ORAL
  Administered 2012-08-27 (×2): 10 mg via ORAL
  Filled 2012-08-18 (×4): qty 2
  Filled 2012-08-18: qty 1
  Filled 2012-08-18: qty 2
  Filled 2012-08-18: qty 1
  Filled 2012-08-18 (×9): qty 2
  Filled 2012-08-18: qty 1
  Filled 2012-08-18 (×3): qty 2

## 2012-08-18 MED ORDER — ASPIRIN EC 325 MG PO TBEC
325.0000 mg | DELAYED_RELEASE_TABLET | Freq: Two times a day (BID) | ORAL | Status: DC
  Administered 2012-08-18 – 2012-08-27 (×18): 325 mg via ORAL
  Filled 2012-08-18 (×19): qty 1

## 2012-08-18 MED ORDER — FENTANYL CITRATE 0.05 MG/ML IJ SOLN
INTRAMUSCULAR | Status: DC | PRN
  Administered 2012-08-18: 150 ug via INTRAVENOUS
  Administered 2012-08-18: 50 ug via INTRAVENOUS
  Administered 2012-08-18: 100 ug via INTRAVENOUS
  Administered 2012-08-18 (×2): 50 ug via INTRAVENOUS
  Administered 2012-08-18: 100 ug via INTRAVENOUS

## 2012-08-18 MED ORDER — BISACODYL 5 MG PO TBEC
5.0000 mg | DELAYED_RELEASE_TABLET | Freq: Every day | ORAL | Status: DC | PRN
  Administered 2012-08-20: 5 mg via ORAL
  Filled 2012-08-18: qty 1

## 2012-08-18 MED ORDER — CEFAZOLIN SODIUM-DEXTROSE 2-3 GM-% IV SOLR
2.0000 g | Freq: Three times a day (TID) | INTRAVENOUS | Status: DC
  Administered 2012-08-18 – 2012-08-27 (×25): 2 g via INTRAVENOUS
  Filled 2012-08-18 (×47): qty 50

## 2012-08-18 MED ORDER — HYDROMORPHONE HCL PF 1 MG/ML IJ SOLN
1.0000 mg | INTRAMUSCULAR | Status: DC | PRN
  Administered 2012-08-19 (×4): 1 mg via INTRAVENOUS
  Filled 2012-08-18 (×7): qty 1

## 2012-08-18 MED ORDER — AMLODIPINE BESYLATE 10 MG PO TABS
10.0000 mg | ORAL_TABLET | Freq: Every day | ORAL | Status: DC
  Administered 2012-08-19 – 2012-08-27 (×9): 10 mg via ORAL
  Filled 2012-08-18 (×9): qty 1

## 2012-08-18 MED ORDER — ACETAMINOPHEN 10 MG/ML IV SOLN
INTRAVENOUS | Status: AC
  Administered 2012-08-18: 1000 mg via INTRAVENOUS
  Filled 2012-08-18: qty 100

## 2012-08-18 MED ORDER — CEFAZOLIN SODIUM-DEXTROSE 2-3 GM-% IV SOLR
INTRAVENOUS | Status: AC
  Filled 2012-08-18: qty 50

## 2012-08-18 MED ORDER — CEFUROXIME SODIUM 1.5 G IJ SOLR
INTRAMUSCULAR | Status: AC
  Filled 2012-08-18: qty 6

## 2012-08-18 MED ORDER — NEOSTIGMINE METHYLSULFATE 1 MG/ML IJ SOLN
INTRAMUSCULAR | Status: DC | PRN
  Administered 2012-08-18: 2 mg via INTRAVENOUS

## 2012-08-18 MED ORDER — DULOXETINE HCL 60 MG PO CPEP
60.0000 mg | ORAL_CAPSULE | Freq: Every day | ORAL | Status: DC
  Administered 2012-08-19 – 2012-08-27 (×9): 60 mg via ORAL
  Filled 2012-08-18 (×9): qty 1

## 2012-08-18 MED ORDER — GLYCOPYRROLATE 0.2 MG/ML IJ SOLN
INTRAMUSCULAR | Status: DC | PRN
  Administered 2012-08-18 (×2): 0.2 mg via INTRAVENOUS

## 2012-08-18 MED ORDER — LACTATED RINGERS IV SOLN
INTRAVENOUS | Status: DC | PRN
  Administered 2012-08-18 (×2): via INTRAVENOUS

## 2012-08-18 MED ORDER — HYDROMORPHONE HCL PF 1 MG/ML IJ SOLN
0.2500 mg | INTRAMUSCULAR | Status: DC | PRN
  Administered 2012-08-18 (×4): 0.5 mg via INTRAVENOUS

## 2012-08-18 MED ORDER — FLEET ENEMA 7-19 GM/118ML RE ENEM: 1.0000 | ENEMA | Freq: Once | RECTAL | Status: AC | PRN

## 2012-08-18 MED ORDER — PHENYLEPHRINE HCL 10 MG/ML IJ SOLN
INTRAMUSCULAR | Status: DC | PRN
  Administered 2012-08-18: 40 ug via INTRAVENOUS
  Administered 2012-08-18: 80 ug via INTRAVENOUS

## 2012-08-18 MED ORDER — ALUM & MAG HYDROXIDE-SIMETH 200-200-20 MG/5ML PO SUSP: 30.0000 mL | ORAL | Status: DC | PRN

## 2012-08-18 MED ORDER — DEXAMETHASONE SODIUM PHOSPHATE 4 MG/ML IJ SOLN
INTRAMUSCULAR | Status: DC | PRN
  Administered 2012-08-18: 7 mg via INTRAVENOUS
  Administered 2012-08-18: 4 mg via INTRAVENOUS

## 2012-08-18 MED ORDER — HYDROCHLOROTHIAZIDE 25 MG PO TABS
25.0000 mg | ORAL_TABLET | Freq: Every day | ORAL | Status: DC
  Administered 2012-08-19 – 2012-08-27 (×9): 25 mg via ORAL
  Filled 2012-08-18 (×9): qty 1

## 2012-08-18 MED ORDER — ONDANSETRON HCL 4 MG/2ML IJ SOLN
4.0000 mg | Freq: Four times a day (QID) | INTRAMUSCULAR | Status: DC | PRN
  Administered 2012-08-19: 4 mg via INTRAVENOUS
  Filled 2012-08-18: qty 2

## 2012-08-18 MED ORDER — METOCLOPRAMIDE HCL 5 MG/ML IJ SOLN: 5.0000 mg | Freq: Three times a day (TID) | INTRAMUSCULAR | Status: DC | PRN

## 2012-08-18 MED ORDER — PROPOFOL 10 MG/ML IV BOLUS
INTRAVENOUS | Status: DC | PRN
  Administered 2012-08-18: 160 mg via INTRAVENOUS

## 2012-08-18 MED ORDER — MAGNESIUM HYDROXIDE 400 MG/5ML PO SUSP: 30.0000 mL | Freq: Every day | ORAL | Status: DC | PRN

## 2012-08-18 MED ORDER — CLONAZEPAM 0.5 MG PO TABS
0.5000 mg | ORAL_TABLET | Freq: Every day | ORAL | Status: DC
  Administered 2012-08-20: 0.5 mg via ORAL
  Filled 2012-08-18 (×4): qty 1

## 2012-08-18 MED ORDER — ROCURONIUM BROMIDE 100 MG/10ML IV SOLN
INTRAVENOUS | Status: DC | PRN
  Administered 2012-08-18: 50 mg via INTRAVENOUS

## 2012-08-18 MED ORDER — MIDAZOLAM HCL 5 MG/5ML IJ SOLN
INTRAMUSCULAR | Status: DC | PRN
  Administered 2012-08-18: 2 mg via INTRAVENOUS

## 2012-08-18 MED ORDER — CEFUROXIME SODIUM 1.5 G IJ SOLR
INTRAMUSCULAR | Status: DC | PRN
  Administered 2012-08-18 (×5): 1.5 g

## 2012-08-18 MED ORDER — LIDOCAINE HCL 1 % IJ SOLN
INTRAMUSCULAR | Status: DC | PRN
  Administered 2012-08-18: 80 mg via INTRADERMAL

## 2012-08-18 MED ORDER — MENTHOL 3 MG MT LOZG: 1.0000 | LOZENGE | OROMUCOSAL | Status: DC | PRN

## 2012-08-18 MED ORDER — FENTANYL CITRATE 0.05 MG/ML IJ SOLN: 50.0000 ug | Freq: Once | INTRAMUSCULAR | Status: DC

## 2012-08-18 MED ORDER — DIPHENHYDRAMINE HCL 12.5 MG/5ML PO ELIX: 12.5000 mg | ORAL_SOLUTION | ORAL | Status: DC | PRN

## 2012-08-18 MED ORDER — PROCHLORPERAZINE MALEATE 5 MG PO TABS
5.0000 mg | ORAL_TABLET | Freq: Four times a day (QID) | ORAL | Status: DC | PRN
  Filled 2012-08-18: qty 1

## 2012-08-18 MED ORDER — ACETAMINOPHEN 10 MG/ML IV SOLN
1000.0000 mg | Freq: Four times a day (QID) | INTRAVENOUS | Status: AC
  Administered 2012-08-18 – 2012-08-19 (×3): 1000 mg via INTRAVENOUS
  Filled 2012-08-18 (×4): qty 100

## 2012-08-18 MED ORDER — ONDANSETRON HCL 4 MG PO TABS: 8.0000 mg | ORAL_TABLET | Freq: Three times a day (TID) | ORAL | Status: DC | PRN

## 2012-08-18 MED ORDER — ACETAMINOPHEN 650 MG RE SUPP: 650.0000 mg | Freq: Four times a day (QID) | RECTAL | Status: DC | PRN

## 2012-08-18 MED ORDER — ACETAMINOPHEN 10 MG/ML IV SOLN: 1000.0000 mg | Freq: Once | INTRAVENOUS | Status: DC

## 2012-08-18 MED ORDER — SODIUM CHLORIDE 0.9 % IR SOLN
Status: DC | PRN
  Administered 2012-08-18: 1000 mL
  Administered 2012-08-18: 3000 mL
  Administered 2012-08-18: 1000 mL

## 2012-08-18 MED ORDER — DOCUSATE SODIUM 100 MG PO CAPS
100.0000 mg | ORAL_CAPSULE | Freq: Every day | ORAL | Status: DC
  Administered 2012-08-18 – 2012-08-27 (×10): 100 mg via ORAL
  Filled 2012-08-18 (×9): qty 1

## 2012-08-18 MED ORDER — ONDANSETRON HCL 4 MG/2ML IJ SOLN
INTRAMUSCULAR | Status: DC | PRN
  Administered 2012-08-18: 4 mg via INTRAVENOUS

## 2012-08-18 MED ORDER — MIDAZOLAM HCL 2 MG/2ML IJ SOLN: 1.0000 mg | INTRAMUSCULAR | Status: DC | PRN

## 2012-08-18 MED ORDER — ALPRAZOLAM 0.5 MG PO TABS
0.5000 mg | ORAL_TABLET | Freq: Three times a day (TID) | ORAL | Status: DC | PRN
  Administered 2012-08-19 – 2012-08-26 (×8): 0.5 mg via ORAL
  Filled 2012-08-18 (×8): qty 1

## 2012-08-18 MED ORDER — GABAPENTIN 100 MG PO CAPS
100.0000 mg | ORAL_CAPSULE | Freq: Three times a day (TID) | ORAL | Status: DC
  Administered 2012-08-18 – 2012-08-27 (×26): 100 mg via ORAL
  Filled 2012-08-18 (×30): qty 1

## 2012-08-18 MED ORDER — PANTOPRAZOLE SODIUM 40 MG PO TBEC
80.0000 mg | DELAYED_RELEASE_TABLET | Freq: Every day | ORAL | Status: DC
  Administered 2012-08-19 – 2012-08-27 (×9): 80 mg via ORAL
  Filled 2012-08-18 (×9): qty 2

## 2012-08-18 MED ORDER — ATENOLOL 12.5 MG HALF TABLET
12.5000 mg | ORAL_TABLET | Freq: Every day | ORAL | Status: DC
  Administered 2012-08-19 – 2012-08-27 (×9): 12.5 mg via ORAL
  Filled 2012-08-18 (×9): qty 1

## 2012-08-18 MED ORDER — PROMETHAZINE HCL 25 MG/ML IJ SOLN: 6.2500 mg | INTRAMUSCULAR | Status: DC | PRN

## 2012-08-18 MED ORDER — HYDROMORPHONE HCL PF 1 MG/ML IJ SOLN
INTRAMUSCULAR | Status: AC
  Filled 2012-08-18: qty 1

## 2012-08-18 MED ORDER — HYDROXYZINE HCL 25 MG PO TABS
25.0000 mg | ORAL_TABLET | Freq: Three times a day (TID) | ORAL | Status: DC | PRN
  Administered 2012-08-19 – 2012-08-23 (×4): 25 mg via ORAL
  Filled 2012-08-18 (×4): qty 1

## 2012-08-18 MED ORDER — ACETAMINOPHEN 325 MG PO TABS
650.0000 mg | ORAL_TABLET | Freq: Four times a day (QID) | ORAL | Status: DC | PRN
  Administered 2012-08-19: 650 mg via ORAL
  Filled 2012-08-18: qty 2

## 2012-08-18 SURGICAL SUPPLY — 51 items
BANDAGE ESMARK 6X9 LF (GAUZE/BANDAGES/DRESSINGS) ×2 IMPLANT
BLADE SAG 18X100X1.27 (BLADE) ×3 IMPLANT
BLADE SAW SGTL 13X75X1.27 (BLADE) ×3 IMPLANT
BLADE SURG ROTATE 9660 (MISCELLANEOUS) IMPLANT
BNDG CMPR 9X6 STRL LF SNTH (GAUZE/BANDAGES/DRESSINGS) ×1
BNDG CMPR MED 10X6 ELC LF (GAUZE/BANDAGES/DRESSINGS) ×1
BNDG ELASTIC 6X10 VLCR STRL LF (GAUZE/BANDAGES/DRESSINGS) ×3 IMPLANT
BNDG ESMARK 6X9 LF (GAUZE/BANDAGES/DRESSINGS) ×2
BOWL SMART MIX CTS (DISPOSABLE) ×3 IMPLANT
CLOTH BEACON ORANGE TIMEOUT ST (SAFETY) ×3 IMPLANT
COVER SURGICAL LIGHT HANDLE (MISCELLANEOUS) ×3 IMPLANT
CUFF TOURNIQUET SINGLE 34IN LL (TOURNIQUET CUFF) IMPLANT
CUFF TOURNIQUET SINGLE 44IN (TOURNIQUET CUFF) IMPLANT
DRAPE EXTREMITY T 121X128X90 (DRAPE) ×3 IMPLANT
DRAPE U-SHAPE 47X51 STRL (DRAPES) ×3 IMPLANT
DURAPREP 26ML APPLICATOR (WOUND CARE) ×3 IMPLANT
ELECT REM PT RETURN 9FT ADLT (ELECTROSURGICAL) ×2
ELECTRODE REM PT RTRN 9FT ADLT (ELECTROSURGICAL) ×2 IMPLANT
EVACUATOR 1/8 PVC DRAIN (DRAIN) ×3 IMPLANT
GAUZE XEROFORM 1X8 LF (GAUZE/BANDAGES/DRESSINGS) ×3 IMPLANT
GLOVE BIO SURGEON STRL SZ7 (GLOVE) ×3 IMPLANT
GLOVE BIO SURGEON STRL SZ7.5 (GLOVE) ×3 IMPLANT
GLOVE BIOGEL PI IND STRL 7.0 (GLOVE) ×2 IMPLANT
GLOVE BIOGEL PI IND STRL 8 (GLOVE) ×2 IMPLANT
GLOVE BIOGEL PI INDICATOR 7.0 (GLOVE) ×1
GLOVE BIOGEL PI INDICATOR 8 (GLOVE) ×1
GOWN PREVENTION PLUS XLARGE (GOWN DISPOSABLE) ×3 IMPLANT
GOWN STRL NON-REIN LRG LVL3 (GOWN DISPOSABLE) ×6 IMPLANT
HANDPIECE INTERPULSE COAX TIP (DISPOSABLE) ×2
HOOD PEEL AWAY FACE SHEILD DIS (HOOD) ×9 IMPLANT
KIT BASIN OR (CUSTOM PROCEDURE TRAY) ×3 IMPLANT
KIT ROOM TURNOVER OR (KITS) ×3 IMPLANT
MANIFOLD NEPTUNE II (INSTRUMENTS) ×3 IMPLANT
NS IRRIG 1000ML POUR BTL (IV SOLUTION) ×3 IMPLANT
PACK TOTAL JOINT (CUSTOM PROCEDURE TRAY) ×3 IMPLANT
PAD ARMBOARD 7.5X6 YLW CONV (MISCELLANEOUS) ×6 IMPLANT
PADDING CAST COTTON 6X4 STRL (CAST SUPPLIES) ×3 IMPLANT
SET HNDPC FAN SPRY TIP SCT (DISPOSABLE) ×2 IMPLANT
SPONGE GAUZE 4X4 12PLY (GAUZE/BANDAGES/DRESSINGS) ×6 IMPLANT
STAPLER VISISTAT 35W (STAPLE) ×3 IMPLANT
SUCTION FRAZIER TIP 10 FR DISP (SUCTIONS) ×3 IMPLANT
SUT VIC AB 0 CTX 36 (SUTURE) ×2
SUT VIC AB 0 CTX36XBRD ANTBCTR (SUTURE) ×2 IMPLANT
SUT VIC AB 1 CTX 36 (SUTURE) ×2
SUT VIC AB 1 CTX36XBRD ANBCTR (SUTURE) ×2 IMPLANT
SUT VIC AB 2-0 CT1 27 (SUTURE) ×2
SUT VIC AB 2-0 CT1 TAPERPNT 27 (SUTURE) ×2 IMPLANT
TOWEL OR 17X24 6PK STRL BLUE (TOWEL DISPOSABLE) ×3 IMPLANT
TOWEL OR 17X26 10 PK STRL BLUE (TOWEL DISPOSABLE) ×3 IMPLANT
TRAY FOLEY CATH 14FR (SET/KITS/TRAYS/PACK) IMPLANT
WATER STERILE IRR 1000ML POUR (IV SOLUTION) ×6 IMPLANT

## 2012-08-18 SURGICAL SUPPLY — 64 items
BANDAGE ELASTIC 6 VELCRO ST LF (GAUZE/BANDAGES/DRESSINGS) ×1 IMPLANT
BANDAGE ESMARK 6X9 LF (GAUZE/BANDAGES/DRESSINGS) ×1 IMPLANT
BLADE SAG 18X100X1.27 (BLADE) ×1 IMPLANT
BLADE SAW SGTL 13X75X1.27 (BLADE) ×1 IMPLANT
BLADE SURG ROTATE 9660 (MISCELLANEOUS) IMPLANT
BNDG CMPR 9X6 STRL LF SNTH (GAUZE/BANDAGES/DRESSINGS)
BNDG CMPR MED 10X6 ELC LF (GAUZE/BANDAGES/DRESSINGS)
BNDG ELASTIC 6X10 VLCR STRL LF (GAUZE/BANDAGES/DRESSINGS) ×1 IMPLANT
BNDG ESMARK 6X9 LF (GAUZE/BANDAGES/DRESSINGS)
BOWL SMART MIX CTS (DISPOSABLE) ×1 IMPLANT
CEMENT HV SMART SET (Cement) ×6 IMPLANT
CLOTH BEACON ORANGE TIMEOUT ST (SAFETY) ×2 IMPLANT
COVER SURGICAL LIGHT HANDLE (MISCELLANEOUS) ×2 IMPLANT
CUFF TOURNIQUET SINGLE 34IN LL (TOURNIQUET CUFF) ×1 IMPLANT
CUFF TOURNIQUET SINGLE 44IN (TOURNIQUET CUFF) IMPLANT
DRAPE EXTREMITY T 121X128X90 (DRAPE) ×1 IMPLANT
DRAPE PROXIMA HALF (DRAPES) ×1 IMPLANT
DRAPE U-SHAPE 47X51 STRL (DRAPES) ×2 IMPLANT
DRILL BIT 7/64X5 (BIT) ×1 IMPLANT
DURAPREP 26ML APPLICATOR (WOUND CARE) ×2 IMPLANT
ELECT CAUTERY BLADE 6.4 (BLADE) ×1 IMPLANT
ELECT REM PT RETURN 9FT ADLT (ELECTROSURGICAL) ×2
ELECTRODE REM PT RTRN 9FT ADLT (ELECTROSURGICAL) ×1 IMPLANT
EVACUATOR 1/8 PVC DRAIN (DRAIN) ×2 IMPLANT
FACESHIELD LNG OPTICON STERILE (SAFETY) ×3 IMPLANT
GAUZE XEROFORM 1X8 LF (GAUZE/BANDAGES/DRESSINGS) ×2 IMPLANT
GLOVE BIO SURGEON STRL SZ7 (GLOVE) ×6 IMPLANT
GLOVE BIO SURGEON STRL SZ7.5 (GLOVE) ×4 IMPLANT
GLOVE BIOGEL PI IND STRL 7.0 (GLOVE) ×1 IMPLANT
GLOVE BIOGEL PI IND STRL 8 (GLOVE) ×1 IMPLANT
GLOVE BIOGEL PI INDICATOR 7.0 (GLOVE) ×3
GLOVE BIOGEL PI INDICATOR 8 (GLOVE) ×1
GLOVE ORTHO TXT STRL SZ7.5 (GLOVE) ×2 IMPLANT
GOWN PREVENTION PLUS XLARGE (GOWN DISPOSABLE) ×2 IMPLANT
GOWN STRL NON-REIN LRG LVL3 (GOWN DISPOSABLE) ×4 IMPLANT
GOWN STRL REIN XL XLG (GOWN DISPOSABLE) ×2 IMPLANT
HANDPIECE INTERPULSE COAX TIP (DISPOSABLE) ×2
HOOD PEEL AWAY FACE SHEILD DIS (HOOD) ×3 IMPLANT
IMMOBILIZER KNEE 22  40 CIR (ORTHOPEDIC SUPPLIES) ×1
IMMOBILIZER KNEE 22 40 CIR (ORTHOPEDIC SUPPLIES) IMPLANT
KIT BASIN OR (CUSTOM PROCEDURE TRAY) ×2 IMPLANT
KIT ROOM TURNOVER OR (KITS) ×2 IMPLANT
MANIFOLD NEPTUNE II (INSTRUMENTS) ×2 IMPLANT
NS IRRIG 1000ML POUR BTL (IV SOLUTION) ×2 IMPLANT
PACK TOTAL JOINT (CUSTOM PROCEDURE TRAY) ×2 IMPLANT
PAD ARMBOARD 7.5X6 YLW CONV (MISCELLANEOUS) ×3 IMPLANT
PADDING CAST COTTON 6X4 STRL (CAST SUPPLIES) ×2 IMPLANT
PENCIL BUTTON HOLSTER BLD 10FT (ELECTRODE) ×1 IMPLANT
SET HNDPC FAN SPRY TIP SCT (DISPOSABLE) ×1 IMPLANT
SPONGE GAUZE 4X4 12PLY (GAUZE/BANDAGES/DRESSINGS) ×3 IMPLANT
STAPLER VISISTAT 35W (STAPLE) ×2 IMPLANT
SUCTION FRAZIER TIP 10 FR DISP (SUCTIONS) ×2 IMPLANT
SUT VIC AB 0 CTX 36 (SUTURE) ×2
SUT VIC AB 0 CTX36XBRD ANTBCTR (SUTURE) ×1 IMPLANT
SUT VIC AB 1 CTX 36 (SUTURE) ×2
SUT VIC AB 1 CTX36XBRD ANBCTR (SUTURE) ×1 IMPLANT
SUT VIC AB 2-0 CT1 27 (SUTURE) ×2
SUT VIC AB 2-0 CT1 TAPERPNT 27 (SUTURE) ×1 IMPLANT
SWAB COLLECTION DEVICE MRSA (MISCELLANEOUS) ×1 IMPLANT
TOWEL OR 17X24 6PK STRL BLUE (TOWEL DISPOSABLE) ×3 IMPLANT
TOWEL OR 17X26 10 PK STRL BLUE (TOWEL DISPOSABLE) ×2 IMPLANT
TRAY FOLEY CATH 14FR (SET/KITS/TRAYS/PACK) ×1 IMPLANT
TUBE ANAEROBIC SPECIMEN COL (MISCELLANEOUS) ×1 IMPLANT
WATER STERILE IRR 1000ML POUR (IV SOLUTION) ×4 IMPLANT

## 2012-08-18 NOTE — ED Provider Notes (Signed)
History     CSN: [id number]  Arrival date & time 08/18/12  1452   First MD Initiated Contact with Patient 08/18/12 1456      Chief Complaint  Patient presents with  . Knee Pain    HPI Pt was seen at 1450.   Per pt, c/o gradual onset and persistence of constant left knee pain for the past several months.  States she has been having recurrent infections in her left knee while she has been on chemotherapy.  Ortho MD to take pt to OR today for removal of hardware.  Denies any other complaints.    Past Medical History  Diagnosis Date  . Hypertension   . Hypothyroidism   . Arthritis   . Neuromuscular disorder     MS TX WITH CYMBALTA   . Multiple sclerosis   . H/O colonoscopy   . H/O bone density study 03/2011  . Wears glasses   . Heart burn   . Depression   . Sleep apnea     MILD SLEEP APNEA, NO MACHINE  6 YRS AGO [facility]PT [facility]   . Breast cancer     lul/ER+PR+    Past Surgical History  Procedure Laterality Date  . Tonsillectomy    . Tumor removal      FROM PELVIS AGE 8  . Cesarean section      X2   . No past surgeries      BLADDER SLING  4-5 YRS AGO   . Knee arthroscopy      LT KNEE 04/2011  . Total knee arthroplasty  08/15/2011    Procedure: TOTAL KNEE ARTHROPLASTY; lft Surgeon: Harvie Junior, MD;  Location: [facility] OR;  Service: Orthopedics;  Laterality: Left;  RIGHT KNEE CORTIZONE INJECTION  . Mohs surgery      right face  . Knee arthroscopy  82    rt  . Joint replacement  Feb 2013  . Mastectomy w/ sentinel node biopsy  12/19/2011    Procedure: MASTECTOMY WITH SENTINEL LYMPH NODE BIOPSY;  Surgeon: Currie Paris, MD;  Location: [facility] OR;  Service: General;  Laterality: Left;  left breast and left axilla   . Portacath placement  01/16/2012    Procedure: INSERTION PORT-A-CATH;  Surgeon: Currie Paris, MD;  Location: [facility];  Service: General;  Laterality: Right;  Porta Cath Placement   . I&d knee with poly exchange  03/02/2012    Procedure:  IRRIGATION AND DEBRIDEMENT KNEE WITH POLY EXCHANGE;  Surgeon: Harvie Junior, MD;  Location: [facility] OR;  Service: Orthopedics;  Laterality: Left;  I&D of total knee with Possible Poly Exchange   . Tee without cardioversion  03/06/2012    Procedure: TRANSESOPHAGEAL ECHOCARDIOGRAM (TEE);  Surgeon: Wendall Stade, MD;  Location: [facility]COPY;  Service: Cardiovascular;  Laterality: N/A;  Rm. 2927  . Port-a-cath removal  03/06/2012    Procedure: REMOVAL PORT-A-CATH;  Surgeon: Adolph Pollack, MD;  Location: [facility] OR;  Service: General;  Laterality: N/A;  . Portacath placement  05/01/2012    Procedure: INSERTION PORT-A-CATH;  Surgeon: Currie Paris, MD;  Location: [facility];  Service: General;  Laterality: Right;  right internal jugular port-a-cath insertion    Family History  Problem Relation Age of Onset  . Breast cancer Paternal Aunt   . Breast cancer Maternal Grandmother   . Heart disease Maternal Grandfather     History  Substance Use Topics  . Smoking status: Never Smoker   . Smokeless  tobacco: Never Used  . Alcohol Use: No    Review of Systems ROS: Statement: All systems negative except as marked or noted in the HPI; Constitutional: Negative for fever and chills. ; ; Eyes: Negative for eye pain, redness and discharge. ; ; ENMT: Negative for ear pain, hoarseness, nasal congestion, sinus pressure and sore throat. ; ; Cardiovascular: Negative for chest pain, palpitations, diaphoresis, dyspnea and peripheral edema. ; ; Respiratory: Negative for cough, wheezing and stridor. ; ; Gastrointestinal: Negative for nausea, vomiting, diarrhea, abdominal pain, blood in stool, hematemesis, jaundice and rectal bleeding. . ; ; Genitourinary: Negative for dysuria, flank pain and hematuria. ; ; Musculoskeletal: +left knee pain. Negative for back pain and neck pain. Negative for swelling and trauma.; ; Skin: Negative for pruritus, rash, abrasions, blisters, bruising and skin lesion.; ; Neuro:  Negative for headache, lightheadedness and neck stiffness. Negative for weakness, altered level of consciousness , altered mental status, extremity weakness, paresthesias, involuntary movement, seizure and syncope.       Allergies  Review of patient's allergies indicates no known allergies.  Home Medications   Current Outpatient Rx  Name  Route  Sig  Dispense  Refill  . ALPRAZolam (XANAX) 0.5 MG tablet   Oral   Take 1 tablet (0.5 mg total) by mouth 3 (three) times daily as needed for anxiety.   15 tablet   0   . amLODipine (NORVASC) 10 MG tablet   Oral   Take 1 tablet (10 mg total) by mouth daily.   30 tablet   3   . atenolol (TENORMIN) 25 MG tablet   Oral   Take 0.5 tablets (12.5 mg total) by mouth daily. For blood pressure   15 tablet   0   . baclofen (LIORESAL) 10 MG tablet      10 mg 3 (three) times daily.          Marland Kitchen CALCIUM-MAGNESIUM PO   Oral   Take 1 tablet by mouth daily.         . clonazePAM (KLONOPIN) 0.5 MG tablet   Oral   Take 0.5 mg by mouth Nightly.         . Diclofenac-Misoprostol 75-0.2 MG TBEC      75 mg Twice daily as needed. Take 1 by mouth twice daily as needed         . docusate sodium (COLACE) 100 MG capsule   Oral   Take 100 mg by mouth daily.         . DULoxetine (CYMBALTA) 60 MG capsule   Oral   Take 60 mg by mouth.         . esomeprazole (NEXIUM) 40 MG capsule   Oral   Take 40 mg by mouth daily before breakfast.         . gabapentin (NEURONTIN) 100 MG capsule   Oral   Take 1 capsule (100 mg total) by mouth 3 (three) times daily. For neuropathy   90 capsule   1   . hydrOXYzine (ATARAX/VISTARIL) 25 MG tablet               . IRON PO   Oral   Take 45 mg by mouth every other day.          . losartan-hydrochlorothiazide (HYZAAR) 100-25 MG per tablet      1 tablet daily.          . Multiple Vitamins-Minerals (MULTIVITAMINS THER. W/MINERALS) TABS   Oral   Take 1 tablet  by mouth daily.         .  ondansetron (ZOFRAN) 8 MG tablet               . Probiotic Product (PROBIOTIC DAILY PO)   Oral   Take by mouth.         . prochlorperazine (COMPAZINE) 10 MG tablet      TAKE 1 TABLET BY MOUTH EVERY 6 HOURS AS NEEDED FOR NAUSEA AND VOMITING   30 tablet   0     There were no vitals taken for this visit.  Physical Exam 1455: Physical examination:  Nursing notes reviewed; Vital signs and O2 SAT reviewed;  Constitutional: Well developed, Well nourished, Well hydrated, In no acute distress; Head:  Normocephalic, atraumatic; Eyes: EOMI, PERRL, No scleral icterus; ENMT: Mouth and pharynx normal, Mucous membranes moist; Neck: Supple, Full range of motion, No lymphadenopathy; Cardiovascular: Regular rate and rhythm, No murmur, rub, or gallop; Respiratory: Breath sounds clear & equal bilaterally, No rales, rhonchi, wheezes.  Speaking full sentences with ease, Normal respiratory effort/excursion; Chest: Nontender, Movement normal. +right port-a-cath accessed;; Extremities: Pulses normal, +left knee tenderness to palp with ACE wrap intact. No deformity. No calf edema or asymmetry.; Neuro: AA&Ox3, Major CN grossly intact.  Speech clear. No gross focal motor or sensory deficits in extremities.; Skin: Color normal, Warm, Dry.   ED Course  Procedures    MDM  MDM Reviewed: nursing note, vitals and previous chart     1500:  OR called, are waiting for pt; will send her now.        Laray Anger, DO 08/21/12 1202

## 2012-08-18 NOTE — Anesthesia Preprocedure Evaluation (Addendum)
Anesthesia Evaluation  Patient identified by MRN, date of birth, ID band Patient awake    Reviewed: Allergy & Precautions, H&P , NPO status , Patient's Chart, lab work & pertinent test results  History of Anesthesia Complications Negative for: history of anesthetic complications  Airway Mallampati: II TM Distance: >3 FB Neck ROM: Full    Dental  (+) Teeth Intact and Dental Advisory Given   Pulmonary sleep apnea ,  breath sounds clear to auscultation  Pulmonary exam normal       Cardiovascular hypertension, Pt. on medications Rhythm:Regular Rate:Normal  06/11/12 Study Conclusions  - Left ventricle: The cavity size was normal. Wall thickness   was normal. Systolic function was normal. The estimated   ejection fraction was in the range of 50% to 55%. Wall   motion was normal; there were no regional wall motion   abnormalities. Left ventricular diastolic function   parameters were normal. - Mitral valve: Mild regurgitation. - Pulmonary arteries: PA peak pressure: 35mm Hg (S). Transthoracic echocardiography.  M-mode, complete 2D, spectral Doppler, and color Doppler.  Height:  Height: 167.6cm. Height: 66in.  Weight:  Weight: 86.4kg. Weight: 190lb.  Body mass index:  BMI: 30.7kg/m^2.  Body surface area:    BSA: 2.83m^2.  Patient status:  Inpatient.     Neuro/Psych PSYCHIATRIC DISORDERS Depression MS stable  Neuromuscular disease (MS-cymbalta)    GI/Hepatic negative GI ROS, Neg liver ROS,   Endo/Other  Hypothyroidism Morbid obesityLeft breast CA  Renal/GU Renal InsufficiencyRenal disease (Cr 1.3)     Musculoskeletal  (+) Arthritis -, Osteoarthritis,    Abdominal (+) + obese,   Peds  Hematology   Anesthesia Other Findings   Reproductive/Obstetrics Breast ca hx                       Anesthesia Physical Anesthesia Plan  ASA: III and emergent  Anesthesia Plan: General   Post-op Pain  Management:    Induction: Intravenous  Airway Management Planned: Oral ETT  Additional Equipment:   Intra-op Plan:   Post-operative Plan: Extubation in OR  Informed Consent: I have reviewed the patients History and Physical, chart, labs and discussed the procedure including the risks, benefits and alternatives for the proposed anesthesia with the patient or authorized representative who has indicated his/her understanding and acceptance.     Plan Discussed with: CRNA and Surgeon  Anesthesia Plan Comments: (Last po 8am)        Anesthesia Quick Evaluation

## 2012-08-18 NOTE — Transfer of Care (Signed)
Immediate Anesthesia Transfer of Care Note  Patient: Julie Ross  Procedure(s) Performed: Procedure(s):  Irrigation and debridement of LEFT total knee;  Removal of total knee parts; Implant of spacers (Left)  Patient Location: PACU  Anesthesia Type:General  Level of Consciousness: awake, alert , oriented and patient cooperative  Airway & Oxygen Therapy: Patient Spontanous Breathing and Patient connected to nasal cannula oxygen  Post-op Assessment: Report given to PACU RN, Post -op Vital signs reviewed and stable and Patient moving all extremities X 4  Post vital signs: Reviewed and stable  Complications: No apparent anesthesia complications

## 2012-08-18 NOTE — Anesthesia Postprocedure Evaluation (Signed)
  Anesthesia Post-op Note  Patient: Julie Ross  Procedure(s) Performed: Procedure(s):  Irrigation and debridement of LEFT total knee;  Removal of total knee parts; Implant of spacers (Left)  Patient Location: PACU  Anesthesia Type:General  Level of Consciousness: awake and alert   Airway and Oxygen Therapy: Patient Spontanous Breathing  Post-op Pain: mild  Post-op Assessment: Post-op Vital signs reviewed, Patient's Cardiovascular Status Stable, Respiratory Function Stable, Patent Airway, No signs of Nausea or vomiting and Pain level controlled  Post-op Vital Signs: stable  Complications: No apparent anesthesia complications

## 2012-08-18 NOTE — H&P (View-Only) (Signed)
Julie Ross is an 52 y.o. female.   Chief Complaint: Recurrent infection left total knee HPI: Patient is seen in Dr. Graves absence and is here for removal of the Penrose drain that was placed 2 days ago for drainage from the medial aspect of her left knee which has underlying DePuy Sigma RP left total knee.  Her index procedure was done on 08/15/2011, unfortunately, she got infected and had a polyethylene exchange and washout in September 2012 that was treated with IV antibiotics and by mouth antibiotics for methicillin sensitive staph.  Ancef seem to help.  After the polyethylene exchange.  After initially doing well, she developed some swelling and redness over the medial and anterior aspects of her left knee, despite being on Keflex, and on 08/16/2012 local lancing of an abscess was accomplished with purulent material.  Because the patient has a Port-A-Cath, used as treatment for her breast cancer, she has she started on IV Ancef last night.  Despite this, she had increased pain and swelling as well as some redness going down towards her left foot.  She is here today for drain removal.patient last had a protein bar at 0800 hrs., 08/18/2012.  Past Medical History  Diagnosis Date  . Hypertension   . Hypothyroidism   . Arthritis   . Neuromuscular disorder     MS TX WITH CYMBALTA   . Multiple sclerosis   . H/O colonoscopy   . H/O bone density study 03/2011  . Wears glasses   . Heart burn   . Depression   . Sleep apnea     MILD SLEEP APNEA, NO MACHINE  6 YRS AGO [facility]   . Breast cancer     lul/ER+PR+    Past Surgical History  Procedure Laterality Date  . Tonsillectomy    . Tumor removal      FROM PELVIS AGE 11  . Cesarean section      X2   . No past surgeries      BLADDER SLING  4-5 YRS AGO   . Knee arthroscopy      LT KNEE 04/2011  . Total knee arthroplasty  08/15/2011    Procedure: TOTAL KNEE ARTHROPLASTY; lft Surgeon: John L Graves, MD;  Location: [facility] OR;  Service:  Orthopedics;  Laterality: Left;  RIGHT KNEE CORTIZONE INJECTION  . Mohs surgery      right face  . Knee arthroscopy  82    rt  . Joint replacement  Feb 2013  . Mastectomy w/ sentinel node biopsy  12/19/2011    Procedure: MASTECTOMY WITH SENTINEL LYMPH NODE BIOPSY;  Surgeon: Christian J Streck, MD;  Location: [facility] OR;  Service: General;  Laterality: Left;  left breast and left axilla   . Portacath placement  01/16/2012    Procedure: INSERTION PORT-A-CATH;  Surgeon: Christian J Streck, MD;  Location: [facility];  Service: General;  Laterality: Right;  Porta Cath Placement   . I&d knee with poly exchange  03/02/2012    Procedure: IRRIGATION AND DEBRIDEMENT KNEE WITH POLY EXCHANGE;  Surgeon: John L Graves, MD;  Location: [facility];  Service: Orthopedics;  Laterality: Left;  I&D of total knee with Possible Poly Exchange   . Tee without cardioversion  03/06/2012    Procedure: TRANSESOPHAGEAL ECHOCARDIOGRAM (TEE);  Surgeon: Peter C Nishan, MD;  Location: [facility];  Service: Cardiovascular;  Laterality: N/A;  Rm. [id number]  . Port-a-cath removal  03/06/2012    Procedure: REMOVAL PORT-A-CATH;  Surgeon: Todd J Rosenbower,   MD;  Location: [facility];  Service: General;  Laterality: N/A;  . Portacath placement  05/01/2012    Procedure: INSERTION PORT-A-CATH;  Surgeon: Christian J Streck, MD;  Location: [facility];  Service: General;  Laterality: Right;  right internal jugular port-a-cath insertion    Family History  Problem Relation Age of Onset  . Breast cancer Paternal Aunt   . Breast cancer Maternal Grandmother   . Heart disease Maternal Grandfather    Social History:  reports that she has never smoked. She has never used smokeless tobacco. She reports that she does not drink alcohol or use illicit drugs.  Allergies: No Known Allergies  No prescriptions prior to admission    Results for orders placed in visit on 08/17/12 (from the past 48 hour(s))  CBC WITH DIFFERENTIAL      Status: Abnormal   Collection Time    08/17/12 10:22 AM      Result Value Range   WBC 8.1  3.9 - 10.3 10e3/uL   NEUT# 6.8 (*) 1.5 - 6.5 10e3/uL   HGB 9.6 (*) 11.6 - 15.9 g/dL   HCT 29.2 (*) 34.8 - 46.6 %   Platelets 193  145 - 400 10e3/uL   MCV 81.6  79.5 - 101.0 fL   MCH 26.8  25.1 - 34.0 pg   MCHC 32.9  31.5 - 36.0 g/dL   RBC 3.58 (*) 3.70 - 5.45 10e6/uL   RDW 14.3  11.2 - 14.5 %   lymph# 1.0  0.9 - 3.3 10e3/uL   MONO# 0.3  0.1 - 0.9 10e3/uL   Eosinophils Absolute 0.1  0.0 - 0.5 10e3/uL   Basophils Absolute 0.0  0.0 - 0.1 10e3/uL   NEUT% 84.1 (*) 38.4 - 76.8 %   LYMPH% 11.9 (*) 14.0 - 49.7 %   MONO% 3.2  0.0 - 14.0 %   EOS% 0.6  0.0 - 7.0 %   BASO% 0.2  0.0 - 2.0 %   nRBC 0  0 - 0 %  COMPREHENSIVE METABOLIC PANEL (CC13)     Status: Abnormal   Collection Time    08/17/12 10:23 AM      Result Value Range   Sodium 137  136 - 145 mEq/L   Potassium 3.6  3.5 - 5.1 mEq/L   Chloride 100  98 - 107 mEq/L   CO2 25  22 - 29 mEq/L   Glucose 123 (*) 70 - 99 mg/dl   BUN 17.6  7.0 - 26.0 mg/dL   Creatinine 1.3 (*) 0.6 - 1.1 mg/dL   Total Bilirubin 0.30  0.20 - 1.20 mg/dL   Alkaline Phosphatase 632 (*) 40 - 150 U/L   AST 35 (*) 5 - 34 U/L   ALT 91 (*) 0 - 55 U/L   Total Protein 7.0  6.4 - 8.3 g/dL   Albumin 2.8 (*) 3.5 - 5.0 g/dL   Calcium 9.3  8.4 - 10.4 mg/dL   No results found.  ROSPatient denies any no shortness of breath, chest pain, fevers or chills.  There were no vitals taken for this visit. Physical Exam Well-nourished well-developed patient seated on the exam table in no obvious distress no shortness of breath.  The drain is removed and large amounts of yellow-green purulent material were were expressed.  The knee itself has no palpable effusion, but based on her past history there is serious concern of this goes and the knee joint itself.  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  Plan: We will taken for removal of the implants, radical irrigation and debridement, placement of PMMA spacer loaded with 4.8 g of Rocephin.  Vina Byrd J 08/18/2012, 11:36 AM    

## 2012-08-18 NOTE — Op Note (Signed)
Pre Op Dx: Recurrent infection left total knee  Post Op Dx: Same  Procedure: Radical irrigation and debridement of recurrent infection left total knee with removal of all implants, placement of p.m. and a spacer with 6 g of Zinacef in the cement. Also 2 Hemovac drains.  Surgeon: Nestor Lewandowsky, MD  Assistant: Shirl Harris PA-C  Anesthesia: General  EBL: Minimal  Fluids: 1500 cc of crystalloid, 2 g Ancef.  Tourniquet Time: One hour and 50 minutes.  Indications: Status post primary left total knee in February of 2013, first infection was in September of 2013 with radical irrigation and debridement and polyethylene bearing and revision. Patient did well on 6 weeks of IV Ancef for methicillin sensitive staph, and was on by mouth antibiotics when she had recurrent swelling and pain in the knee about a week ago. She was seen in the clinic 2 days ago and an abscess near the pes anserine bursa was drained with placement of a Penrose drain. When she presented for removal of the drain she had increased pain in her knee with swelling going down to the ankle. Large amounts of yellow-green pus shot out of the abscess cavity when the drain was removed. She is taken for radical irrigation and debridement for presumed intra-articular infection, and since this is recurrent removal of implants and placement of antibiotic spacer. Risks and benefits of surgery were discussed at length with the patient, who is also undergoing chemotherapy for breast cancer.  Procedure: The patient was identified by arm band and receive preoperative IV antibiotics 2 g Ancef. She was taken to operating room 3 at [facility] where the appropriate anesthetic monitors were attached and general endotracheal anesthesia was induced with the patient in the supine position. A Foley catheter was inserted. A tourniquet was applied high to the left thigh, a foot positioner applied to the table as well as a lateral post. The left lower  extremity was prepped and draped in the usual sterile fashion from the ankle to the midthigh. A timeout procedure was performed. The limb was held elevated for 4 minutes during the prep and drape and after the timeout the tourniquet was inflated to 350 mmHg with a limb elevated. We then recreated the anterior midline parapatellar incision through the skin and immediately on cutting just flow the tip of the patella large amounts of purulent material were expressed extending out over the medial flare of the tibia. There was a hole in the capsule exposing the middle of the tibial implant and possibly be seen coming out of the joint itself. We then recreated the medial parapatellar arthrotomy and removed large amounts of purulent material completing a complete synovectomy. The superficial medial collateral ligament was then elevated off the proximal flare of the tibia allowing [place] to externally rotated tibia with the patella subluxed over laterally. Using a one-inch osteotome we then cut the stem off the tibial implant and removed the 12.5 mm bearing. We then continued to work our way around the posterior lateral aspects of the tibia completing our exposure and after removing scar tissue along the lateral side we were able you for at the patella. Using an osteotome we then removed the tibial implant and cement from the tibial plateau and going down the intramedullary canal there was an abscess cavity in the medial aspect of the tibia 0.5 cm x 2 cm x 0.5 cm deep consistent with osteomyelitis. We then direct her attention to the femoral implant and likewise loosened of this  with thin osteotomes and a standard osteotomes and removed it without too much difficulty. With the patella everted we then removed the patellar implant with a thin osteotome and likewise remove cement from the bone surface although there was also an abscess cavity in the patella along the left medial border. A drill was used to remove the cement plugs  from the patella as well as the distal femur. Having completed the synovectomy removal of implants and cement all bony surfaces were then waterpicked clean and dried with suction and sponges. A quadruple batch of DePuy HV cement with 6 g of Zinacef was then next and fashioned into a cement spacer with the knee flexed the 10 and traction applied. We also fashioned a patellar button that we cement on the back of the patella again with the antibiotic impregnated cement. Pulse lavage was used to cool the cement as it hardened. Medium Hemovac drains were then placed from a lateral approach the parapatellar arthrotomy was closed with running 0 Vicryl suture, the subcutaneous tissue 2-0 Vicryl suture and the skin with skin staples followed by a dressing of Xerofoam 4 x 4 dressing sponges web roll Ace wrap and a knee immobilizer. The patient was awakened extubated and taken to the recovery room without difficulty.

## 2012-08-18 NOTE — Interval H&P Note (Signed)
History and Physical Interval Note:  08/18/2012 3:21 PM  Julie Ross  has presented today for surgery, with the diagnosis of infected total knee  The various methods of treatment have been discussed with the patient and family. After consideration of risks, benefits and other options for treatment, the patient has consented to  Procedure(s): TOTAL KNEE ARTHROPLASTY implant removal with pmnna anitibiotic spacers (Right) as a surgical intervention .  The patient's history has been reviewed, patient examined, no change in status, stable for surgery.  I have reviewed the patient's chart and labs.  Questions were answered to the patient's satisfaction.     Nestor Lewandowsky

## 2012-08-18 NOTE — ED Notes (Signed)
Pt report to the ED for surgical removal of total knee hardware. Pt reports that she had total knee done one year ago and then she had to begin chemotherapy and the knee became infected. Pt reports she had a debridement surgically done but this still did not resolve the infection. Pt presents today for surgical removal of the hardware. Pt is complaining of pain to the left knee, swelling, erythema, and warmth. Ace wrap in place over knee. CMS in affected extremity intact and pt can wiggle her toes. Full ROM of knee limited by pain.

## 2012-08-18 NOTE — Progress Notes (Signed)
Orthopedic Tech Progress Note Patient Details:  Julie Ross Apr 06, 1961 [id number]  Patient ID: [name], female   DOB: Apr 10, 1961, 52 y.o.   MRN: [id number] Trapeze bar patient helper Viewed order from rn order list Nikki Dom 08/18/2012, 8:40 PM

## 2012-08-18 NOTE — Anesthesia Procedure Notes (Signed)
Procedure Name: Intubation Date/Time: 08/18/2012 3:49 PM Performed by: Leona Singleton A Pre-anesthesia Checklist: Patient identified Patient Re-evaluated:Patient Re-evaluated prior to inductionOxygen Delivery Method: Circle system utilized Preoxygenation: Pre-oxygenation with 100% oxygen Intubation Type: IV induction Ventilation: Mask ventilation without difficulty Laryngoscope Size: Miller and 2 Grade View: Grade I Tube type: Oral Tube size: 7.5 mm Number of attempts: 1 Airway Equipment and Method: Stylet Placement Confirmation: ETT inserted through vocal cords under direct vision,  positive ETCO2 and breath sounds checked- equal and bilateral Secured at: 22 cm Tube secured with: Tape Dental Injury: Teeth and Oropharynx as per pre-operative assessment

## 2012-08-18 NOTE — H&P (Signed)
Julie Ross is an 52 y.o. female.   Chief Complaint: Recurrent infection left total knee HPI: Patient is seen in Dr. Luiz Blare absence and is here for removal of the Penrose drain that was placed 2 days ago for drainage from the medial aspect of her left knee which has underlying DePuy Sigma RP left total knee.  Her index procedure was done on 08/15/2011, unfortunately, she got infected and had a polyethylene exchange and washout in September 2012 that was treated with IV antibiotics and by mouth antibiotics for methicillin sensitive staph.  Ancef seem to help.  After the polyethylene exchange.  After initially doing well, she developed some swelling and redness over the medial and anterior aspects of her left knee, despite being on Keflex, and on 08/16/2012 local lancing of an abscess was accomplished with purulent material.  Because the patient has a Port-A-Cath, used as treatment for her breast cancer, she has she started on IV Ancef last night.  Despite this, she had increased pain and swelling as well as some redness going down towards her left foot.  She is here today for drain removal.patient last had a protein bar at 0800 hrs., 08/18/2012.  Past Medical History  Diagnosis Date  . Hypertension   . Hypothyroidism   . Arthritis   . Neuromuscular disorder     MS TX WITH CYMBALTA   . Multiple sclerosis   . H/O colonoscopy   . H/O bone density study 03/2011  . Wears glasses   . Heart burn   . Depression   . Sleep apnea     MILD SLEEP APNEA, NO MACHINE  6 YRS AGO [facility]   . Breast cancer     lul/ER+PR+    Past Surgical History  Procedure Laterality Date  . Tonsillectomy    . Tumor removal      FROM PELVIS AGE 22  . Cesarean section      X2   . No past surgeries      BLADDER SLING  4-5 YRS AGO   . Knee arthroscopy      LT KNEE 04/2011  . Total knee arthroplasty  08/15/2011    Procedure: TOTAL KNEE ARTHROPLASTY; lft Surgeon: Harvie Junior, MD;  Location: [facility];  Service:  Orthopedics;  Laterality: Left;  RIGHT KNEE CORTIZONE INJECTION  . Mohs surgery      right face  . Knee arthroscopy  82    rt  . Joint replacement  Feb 2013  . Mastectomy w/ sentinel node biopsy  12/19/2011    Procedure: MASTECTOMY WITH SENTINEL LYMPH NODE BIOPSY;  Surgeon: Currie Paris, MD;  Location: [facility];  Service: General;  Laterality: Left;  left breast and left axilla   . Portacath placement  01/16/2012    Procedure: INSERTION PORT-A-CATH;  Surgeon: Currie Paris, MD;  Location: [facility];  Service: General;  Laterality: Right;  Porta Cath Placement   . I&d knee with poly exchange  03/02/2012    Procedure: IRRIGATION AND DEBRIDEMENT KNEE WITH POLY EXCHANGE;  Surgeon: Harvie Junior, MD;  Location: [facility];  Service: Orthopedics;  Laterality: Left;  I&D of total knee with Possible Poly Exchange   . Tee without cardioversion  03/06/2012    Procedure: TRANSESOPHAGEAL ECHOCARDIOGRAM (TEE);  Surgeon: Wendall Stade, MD;  Location: [facility];  Service: Cardiovascular;  Laterality: N/A;  Rm. 2927  . Port-a-cath removal  03/06/2012    Procedure: REMOVAL PORT-A-CATH;  Surgeon: Adolph Pollack,  MD;  Location: [facility];  Service: General;  Laterality: N/A;  . Portacath placement  05/01/2012    Procedure: INSERTION PORT-A-CATH;  Surgeon: Currie Paris, MD;  Location: [facility];  Service: General;  Laterality: Right;  right internal jugular port-a-cath insertion    Family History  Problem Relation Age of Onset  . Breast cancer Paternal Aunt   . Breast cancer Maternal Grandmother   . Heart disease Maternal Grandfather    Social History:  reports that she has never smoked. She has never used smokeless tobacco. She reports that she does not drink alcohol or use illicit drugs.  Allergies: No Known Allergies  No prescriptions prior to admission    Results for orders placed in visit on 08/17/12 (from the past 48 hour(s))  CBC WITH DIFFERENTIAL      Status: Abnormal   Collection Time    08/17/12 10:22 AM      Result Value Range   WBC 8.1  3.9 - 10.3 10e3/uL   NEUT# 6.8 (*) 1.5 - 6.5 10e3/uL   HGB 9.6 (*) 11.6 - 15.9 g/dL   HCT 16.1 (*) 09.6 - 04.5 %   Platelets 193  145 - 400 10e3/uL   MCV 81.6  79.5 - 101.0 fL   MCH 26.8  25.1 - 34.0 pg   MCHC 32.9  31.5 - 36.0 g/dL   RBC 4.09 (*) 8.11 - 9.14 10e6/uL   RDW 14.3  11.2 - 14.5 %   lymph# 1.0  0.9 - 3.3 10e3/uL   MONO# 0.3  0.1 - 0.9 10e3/uL   Eosinophils Absolute 0.1  0.0 - 0.5 10e3/uL   Basophils Absolute 0.0  0.0 - 0.1 10e3/uL   NEUT% 84.1 (*) 38.4 - 76.8 %   LYMPH% 11.9 (*) 14.0 - 49.7 %   MONO% 3.2  0.0 - 14.0 %   EOS% 0.6  0.0 - 7.0 %   BASO% 0.2  0.0 - 2.0 %   nRBC 0  0 - 0 %  COMPREHENSIVE METABOLIC PANEL (CC13)     Status: Abnormal   Collection Time    08/17/12 10:23 AM      Result Value Range   Sodium 137  136 - 145 mEq/L   Potassium 3.6  3.5 - 5.1 mEq/L   Chloride 100  98 - 107 mEq/L   CO2 25  22 - 29 mEq/L   Glucose 123 (*) 70 - 99 mg/dl   BUN 78.2  7.0 - 95.6 mg/dL   Creatinine 1.3 (*) 0.6 - 1.1 mg/dL   Total Bilirubin 2.13  0.20 - 1.20 mg/dL   Alkaline Phosphatase 632 (*) 40 - 150 U/L   AST 35 (*) 5 - 34 U/L   ALT 91 (*) 0 - 55 U/L   Total Protein 7.0  6.4 - 8.3 g/dL   Albumin 2.8 (*) 3.5 - 5.0 g/dL   Calcium 9.3  8.4 - 08.6 mg/dL   No results found.  ROSPatient denies any no shortness of breath, chest pain, fevers or chills.  There were no vitals taken for this visit. Physical Exam Well-nourished well-developed patient seated on the exam table in no obvious distress no shortness of breath.  The drain is removed and large amounts of yellow-green purulent material were were expressed.  The knee itself has no palpable effusion, but based on her past history there is serious concern of this goes and the knee joint itself.  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  Plan: We will taken for removal of the implants, radical irrigation and debridement, placement of PMMA spacer loaded with 4.8 g of Rocephin.  Jossue Rubenstein J 08/18/2012, 11:36 AM

## 2012-08-18 NOTE — Preoperative (Signed)
Beta Blockers   Reason not to administer Beta Blockers:atenolol 08/18/12 1200

## 2012-08-19 ENCOUNTER — Encounter (HOSPITAL_COMMUNITY): Payer: Self-pay | Admitting: Orthopedic Surgery

## 2012-08-19 LAB — CBC
HCT: 23.4 % — ABNORMAL LOW (ref 36.0–46.0)
Hemoglobin: 8 g/dL — ABNORMAL LOW (ref 12.0–15.0)
MCH: 27.7 pg (ref 26.0–34.0)
MCHC: 34.2 g/dL (ref 30.0–36.0)
MCV: 81 fL (ref 78.0–100.0)
Platelets: 182 10*3/uL (ref 150–400)
RBC: 2.89 MIL/uL — ABNORMAL LOW (ref 3.87–5.11)
RDW: 14.5 % (ref 11.5–15.5)
WBC: 12.8 10*3/uL — ABNORMAL HIGH (ref 4.0–10.5)

## 2012-08-19 LAB — BASIC METABOLIC PANEL
BUN: 15 mg/dL (ref 6–23)
CO2: 26 mEq/L (ref 19–32)
Calcium: 9 mg/dL (ref 8.4–10.5)
Chloride: 98 mEq/L (ref 96–112)
Creatinine, Ser: 1.12 mg/dL — ABNORMAL HIGH (ref 0.50–1.10)
GFR calc Af Amer: 64 mL/min — ABNORMAL LOW (ref >90)
GFR calc non Af Amer: 55 mL/min — ABNORMAL LOW (ref >90)
Glucose, Bld: 173 mg/dL — ABNORMAL HIGH (ref 70–99)
Potassium: 5 mEq/L (ref 3.5–5.1)
Sodium: 132 mEq/L — ABNORMAL LOW (ref 135–145)

## 2012-08-19 MED ORDER — SODIUM CHLORIDE 0.9 % IV SOLN
INTRAVENOUS | Status: DC
  Administered 2012-08-19 – 2012-08-26 (×10): via INTRAVENOUS

## 2012-08-19 MED ORDER — SODIUM CHLORIDE 0.9 % IJ SOLN
10.0000 mL | INTRAMUSCULAR | Status: DC | PRN
  Administered 2012-08-19: 10 mL

## 2012-08-19 NOTE — Evaluation (Signed)
Physical Therapy Evaluation Patient Details Name: Julie Ross MRN: [id number] DOB: [birth year] Today's Date: 08/19/2012 Time: 6213-0865 PT Time Calculation (min): 37 min  PT Assessment / Plan / Recommendation Clinical Impression  Pt very motivated and willing to participate in PT.  Acute PT indicated to maximize functional mobility to ensure safe d/c home with family.  Anticipate pt will be ready for d/c home Tues or Wed from a mobility standpoint.  Depending on her ability to navigate stairs, she may need a hospital bed for downstairs.    PT Assessment  Patient needs continued PT services    Follow Up Recommendations  Home health PT;Supervision/Assistance - 24 hour    Does the patient have the potential to tolerate intense rehabilitation      Barriers to Discharge None      Equipment Recommendations  None recommended by PT    Recommendations for Other Services     Frequency Min 6X/week    Precautions / Restrictions Precautions Required Braces or Orthoses: Knee Immobilizer - Left Knee Immobilizer - Left: On at all times Restrictions Weight Bearing Restrictions: Yes LLE Weight Bearing: Partial weight bearing   Pertinent Vitals/Pain 9/10      Mobility  Bed Mobility Bed Mobility: Not assessed Details for Bed Mobility Assistance: Pt up in bedside recliner. Transfers Transfers: Sit to Stand;Stand to Sit Sit to Stand: 4: Min assist;With upper extremity assist;From chair/3-in-1 Stand to Sit: 4: Min guard;With upper extremity assist;To chair/3-in-1 Details for Transfer Assistance: verbal cues for sequencing Ambulation/Gait Ambulation/Gait Assistance: 4: Min guard Ambulation Distance (Feet): 50 Feet Assistive device: Rolling walker Ambulation/Gait Assistance Details: verbal cues for sequencing Gait Pattern: Step-to pattern;Antalgic Gait velocity: decreased    Exercises     PT Diagnosis: Difficulty walking;Acute pain  PT Problem List: Decreased activity  tolerance;Decreased mobility;Pain PT Treatment Interventions: DME instruction;Gait training;Stair training;Functional mobility training;Therapeutic activities;Patient/family education   PT Goals Acute Rehab PT Goals PT Goal Formulation: With patient Time For Goal Achievement: 08/19/12 Potential to Achieve Goals: Good Pt will go Supine/Side to Sit: with modified independence PT Goal: Supine/Side to Sit - Progress: Goal set today Pt will go Sit to Supine/Side: with modified independence PT Goal: Sit to Supine/Side - Progress: Goal set today Pt will go Sit to Stand: with modified independence PT Goal: Sit to Stand - Progress: Goal set today Pt will go Stand to Sit: with modified independence PT Goal: Stand to Sit - Progress: Goal set today Pt will Ambulate: 51 - 150 feet;with rolling walker;with supervision PT Goal: Ambulate - Progress: Goal set today Pt will Go Up / Down Stairs: Flight;with rail(s);with min assist PT Goal: Up/Down Stairs - Progress: Goal set today  Visit Information  Last PT Received On: 08/19/12 Assistance Needed: +1    Subjective Data  Subjective: "I have been sitting up in the chair for a while." Patient Stated Goal: home   Prior Functioning  Home Living Lives With: Family Available Help at Discharge: Family;Available 24 hours/day Type of Home: House Home Access: Stairs to enter [employer] of Steps: 2 Entrance Stairs-Rails: None Home Layout: Two level Alternate Level Stairs-Number of Steps: 11 Alternate Level Stairs-Rails: Right Bathroom Toilet: Standard Bathroom Accessibility: Yes How Accessible: Accessible via walker Home Adaptive Equipment: Walker - rolling;Straight cane;Raised toilet seat with rails;Shower chair with back;Bedside commode/3-in-1 Prior Function Level of Independence: Independent with assistive device(s) Able to Take Stairs?: Yes Driving: No Communication Communication: No difficulties Dominant Hand: Right     Cognition  Cognition Overall Cognitive Status: Appears  within functional limits for tasks assessed/performed Arousal/Alertness: Awake/alert Orientation Level: Oriented X4 / Intact Behavior During Session: [facility] for tasks performed    Extremity/Trunk Assessment     Balance    End of Session PT - End of Session Equipment Utilized During Treatment: Gait belt;Right knee immobilizer Activity Tolerance: Patient tolerated treatment well Patient left: in chair;with call bell/phone within reach;with family/visitor present Nurse Communication: Patient requests pain meds  GP     Ilda Foil 08/19/2012, 2:21 PM  Aida Raider, PT  Office # [phone] Pager [phone]

## 2012-08-19 NOTE — Progress Notes (Signed)
PATIENT ID: Julie Ross  MRN: [id number]  DOB/AGE:  [birth year] / 52 y.o.  1 Day Post-Op Procedure(s) (LRB):  Irrigation and debridement of LEFT total knee;  Removal of total knee parts; Implant of spacers (Left)    PROGRESS NOTE Subjective: Patient is alert, oriented, no Nausea, no Vomiting, yes passing gas, no Bowel Movement. Taking PO well. Denies SOB, Chest or Calf Pain. Using Incentive Spirometer, PAS in place. Patient reports pain as moderate  .    Objective: Vital signs in last 24 hours: Filed Vitals:   08/18/12 2117 08/18/12 2300 08/19/12 0000 08/19/12 0400  BP: 125/60 120/64  125/63  Pulse: 68 60  66  Temp: 98 F (36.7 C) 98.7 F (37.1 C)  97.8 F (36.6 C)  TempSrc:      Resp: 18 18 18 20   Height:    5\' 6"  (1.676 m)  Weight:    86.183 kg (190 lb)  SpO2: 95% 99% 99% 96%      Intake/Output from previous day: I/O last 3 completed shifts: In: 3013.8 [P.O.:920; I.V.:2093.8] Out: 2000 [Urine:1800; Drains:100; Blood:100]   Intake/Output this shift:     LABORATORY DATA:  Recent Labs  08/17/12 1022 08/17/12 1023 08/19/12 0500  WBC 8.1  --  12.8*  HGB 9.6*  --  8.0*  HCT 29.2*  --  23.4*  PLT 193  --  182  NA  --  137 132*  K  --  3.6 5.0  CL  --  100 98  CO2  --  25 26  BUN  --  17.6 15  CREATININE  --  1.3* 1.12*  GLUCOSE  --  123* 173*  CALCIUM  --  9.3 9.0    Examination: Neurologically intact ABD soft Neurovascular intact Sensation intact distally Intact pulses distally Dorsiflexion/Plantar flexion intact Incision: dressing C/D/I}  Assessment:   1 Day Post-Op Procedure(s) (LRB):  Irrigation and debridement of LEFT total knee;  Removal of total knee parts; Implant of spacers (Left) ADDITIONAL DIAGNOSIS:  Hyponatremia, Acute Blood Loss Anemia - asymptomatic  Plan: PT/OT PWB 30 lbs with immobilizer, no CPM DVT Prophylaxis:  SCDx72hrs, ASA 325 mg BID x 2 weeks Continue IV ancef, Change IV fluids to normal saline DISCHARGE PLAN: Home DISCHARGE  NEEDS: HHPT, HHRN, Walker and 3-in-1 comode seat, IV antibiotics     WILLIAMS,JENNIFER M. 08/19/2012, 9:32 AM

## 2012-08-20 ENCOUNTER — Encounter (HOSPITAL_COMMUNITY): Payer: Self-pay | Admitting: Orthopedic Surgery

## 2012-08-20 DIAGNOSIS — T8450XA Infection and inflammatory reaction due to unspecified internal joint prosthesis, initial encounter: Secondary | ICD-10-CM

## 2012-08-20 DIAGNOSIS — Y831 Surgical operation with implant of artificial internal device as the cause of abnormal reaction of the patient, or of later complication, without mention of misadventure at the time of the procedure: Secondary | ICD-10-CM

## 2012-08-20 LAB — CBC
HCT: 21.6 % — ABNORMAL LOW (ref 36.0–46.0)
Hemoglobin: 7.1 g/dL — ABNORMAL LOW (ref 12.0–15.0)
MCH: 27.3 pg (ref 26.0–34.0)
MCHC: 32.9 g/dL (ref 30.0–36.0)
MCV: 83.1 fL (ref 78.0–100.0)
Platelets: 181 10*3/uL (ref 150–400)
RBC: 2.6 MIL/uL — ABNORMAL LOW (ref 3.87–5.11)
RDW: 15.8 % — ABNORMAL HIGH (ref 11.5–15.5)
WBC: 7.5 10*3/uL (ref 4.0–10.5)

## 2012-08-20 LAB — PREPARE RBC (CROSSMATCH)

## 2012-08-20 MED ORDER — SODIUM CHLORIDE 0.9 % IJ SOLN
10.0000 mL | INTRAMUSCULAR | Status: DC | PRN
  Administered 2012-08-20: 10 mL

## 2012-08-20 MED ORDER — OXYCODONE HCL ER 10 MG PO T12A
20.0000 mg | EXTENDED_RELEASE_TABLET | Freq: Two times a day (BID) | ORAL | Status: DC
  Administered 2012-08-20 – 2012-08-23 (×7): 20 mg via ORAL
  Administered 2012-08-23 – 2012-08-24 (×2): 10 mg via ORAL
  Administered 2012-08-24 – 2012-08-27 (×6): 20 mg via ORAL
  Filled 2012-08-20: qty 2
  Filled 2012-08-20: qty 1
  Filled 2012-08-20 (×2): qty 2
  Filled 2012-08-20: qty 1
  Filled 2012-08-20 (×10): qty 2

## 2012-08-20 MED ORDER — HEPARIN SOD (PORK) LOCK FLUSH 100 UNIT/ML IV SOLN
500.0000 [IU] | INTRAVENOUS | Status: DC
  Filled 2012-08-20: qty 5

## 2012-08-20 MED ORDER — HEPARIN SOD (PORK) LOCK FLUSH 100 UNIT/ML IV SOLN
500.0000 [IU] | INTRAVENOUS | Status: DC | PRN
  Administered 2012-08-20 – 2012-08-27 (×2): 500 [IU]
  Filled 2012-08-20: qty 5

## 2012-08-20 NOTE — Progress Notes (Signed)
[facility]  Patient Status: Active (receiving services up to time of hospitalization)  [facility] is providing the following services: RN - IV antibiotics  If patient discharges after hours, please call [phone].   Jodene Nam 08/20/2012, 12:57 PM

## 2012-08-20 NOTE — Progress Notes (Signed)
Right chest PAC accessed/saline locked - scant blood return noted , sluggish flush so NS and Heparin instilled to maintain patency.

## 2012-08-20 NOTE — Progress Notes (Signed)
UR COMPLETED  

## 2012-08-20 NOTE — Progress Notes (Signed)
PATIENT ID: Julie Ross  MRN: [id number]  DOB/AGE:  01/13/1961 / 52 y.o.  2 Days Post-Op Procedure(s) (LRB):  Irrigation and debridement of LEFT total knee;  Removal of total knee parts; Implant of spacers (Left)    PROGRESS NOTE Subjective: Patient is alert, oriented, no Nausea, no Vomiting, yes passing gas, no Bowel Movement. Taking PO well. Complains of fatigue. Ambulating slowly with PT. Patient reports pain as moderate  .    Objective: Vital signs in last 24 hours: Filed Vitals:   08/19/12 2000 08/19/12 2233 08/20/12 0000 08/20/12 0636  BP:  110/65  115/68  Pulse:  50  58  Temp:  97.1 F (36.2 C)  97.6 F (36.4 C)  TempSrc:      Resp: 20 18 18 18   Height:      Weight:      SpO2: 95% 96% 96% 99%      Intake/Output from previous day: I/O last 3 completed shifts: In: 3053.8 [P.O.:2160; I.V.:893.8] Out: 1750 [Urine:1600; Drains:150]   Intake/Output this shift:     LABORATORY DATA:  Recent Labs  08/17/12 1023 08/19/12 0500 08/20/12 0500  WBC  --  12.8* 7.5  HGB  --  8.0* 7.1*  HCT  --  23.4* 21.6*  PLT  --  182 181  NA 137 132*  --   K 3.6 5.0  --   CL 100 98  --   CO2 25 26  --   BUN 17.6 15  --   CREATININE 1.3* 1.12*  --   GLUCOSE 123* 173*  --   CALCIUM 9.3 9.0  --     Examination: Neurologically intact ABD soft Neurovascular intact Sensation intact distally Intact pulses distally Dorsiflexion/Plantar flexion intact Incision: dressing C/D/I}  Assessment:   2 Days Post-Op Procedure(s) (LRB):  Irrigation and debridement of LEFT total knee;  Removal of total knee parts; Implant of spacers (Left) ADDITIONAL DIAGNOSIS:  Acute Blood Loss Anemia  Plan: PT/OT PWB with immobilizer. Leave hemovac drain in 48 hrs. Transfuse 2 units PRBC DVT Prophylaxis:  SCDx72hrs, ASA 325 mg BID x 2 weeks DISCHARGE PLAN: Home DISCHARGE NEEDS: HHPT, HHRN, Walker and 3-in-1 comode seat     WILLIAMS,JENNIFER M. 08/20/2012, 7:37 AM

## 2012-08-20 NOTE — Evaluation (Signed)
Occupational Therapy Evaluation Patient Details Name: Julie Ross MRN: [id number] DOB: Feb 11, 1961 Today's Date: 08/20/2012 Time: 4098-1191 OT Time Calculation (min): 41 min  OT Assessment / Plan / Recommendation Clinical Impression  Pt demos decline in function with ADLs, strength, balance, safety and activity tolerance following L knee surgery. Pt would benefit from OT services to address these impairments to help retsore PLOF to return home safely    OT Assessment  Patient needs continued OT Services    Follow Up Recommendations  Home health OT;Supervision/Assistance - 24 hour    Barriers to Discharge None    Equipment Recommendations  None recommended by OT    Recommendations for Other Services    Frequency  Min 2X/week    Precautions / Restrictions Precautions Precautions: Knee Required Braces or Orthoses: Knee Immobilizer - Left Knee Immobilizer - Left: On at all times Restrictions Weight Bearing Restrictions: Yes LLE Weight Bearing: Partial weight bearing    8/10 reports of pain    ADL  Grooming: Performed;Supervision/safety;Set up Where Assessed - Grooming: Unsupported sitting Upper Body Bathing: Simulated;Supervision/safety;Set up Where Assessed - Upper Body Bathing: Unsupported sitting Lower Body Bathing: Simulated;Maximal assistance Upper Body Dressing: Performed;Supervision/safety;Set up;Minimal assistance;Other (comment) (min A due to IV lines) Where Assessed - Upper Body Dressing: Unsupported sitting Lower Body Dressing: +1 Total assistance Toilet Transfer: Performed;Minimal assistance Toilet Transfer Method: Sit to stand;Other (comment) (ambulating with RW) Toilet Transfer Equipment: Bedside commode Toileting - Clothing Manipulation and Hygiene: Performed;Maximal assistance;Other (comment) (hygiene min A) Equipment Used: Long-handled shoe horn;Long-handled sponge;Reacher;Rolling walker;Gait belt;Sock aid;Knee Immobilizer (BSC) Transfers/Ambulation Related  to ADLs: pt required verbal cues for correct hand and LE position, pt unable to tolerate ambulating into bathroom, used BSC instead ADL Comments: Pt and husband state that pt previously had ADL A/E kit but never used it and donated it. Pt and spouse provded with education and demo of ADL A/E for hme use    OT Diagnosis: Generalized weakness;Acute pain  OT Problem List: Decreased strength;Pain;Impaired balance (sitting and/or standing);Decreased activity tolerance OT Treatment Interventions: Self-care/ADL training;Balance training;Therapeutic exercise;Neuromuscular education;Therapeutic activities;DME and/or AE instruction;Patient/family education   OT Goals Acute Rehab OT Goals OT Goal Formulation: With patient/family Time For Goal Achievement: 08/27/12 Potential to Achieve Goals: Good ADL Goals Pt Will Perform Lower Body Bathing: with mod assist;with adaptive equipment ADL Goal: Lower Body Bathing - Progress: Goal set today Pt Will Perform Lower Body Dressing: with mod assist;with max assist;with adaptive equipment ADL Goal: Lower Body Dressing - Progress: Goal set today Pt Will Transfer to Toilet: with supervision;with DME ADL Goal: Toilet Transfer - Progress: Goal set today Pt Will Perform Toileting - Clothing Manipulation: with min assist;with mod assist;Standing ADL Goal: Toileting - Clothing Manipulation - Progress: Goal set today Pt Will Perform Toileting - Hygiene: with supervision;Leaning right and/or left on 3-in-1/toilet;Standing at 3-in-1/toilet ADL Goal: Toileting - Hygiene - Progress: Goal set today Pt Will Perform Tub/Shower Transfer: with supervision;with DME ADL Goal: Tub/Shower Transfer - Progress: Goal set today  Visit Information  Last OT Received On: 08/20/12    Subjective Data  Subjective: " I need to go to the bathroom, but I don't think I can make it into there " Patient Stated Goal: To return home   Prior Functioning     Home Living Lives With:  Family Available Help at Discharge: Family;Available 24 hours/day Type of Home: House Home Access: Stairs to enter [employer] of Steps: 2 Entrance Stairs-Rails: None Home Layout: Two level Alternate Level Stairs-Number of Steps: 11  Alternate Level Stairs-Rails: Right Bathroom Toilet: Standard Bathroom Accessibility: Yes How Accessible: Accessible via walker Home Adaptive Equipment: Walker - rolling;Straight cane;Raised toilet seat with rails;Shower chair with back;Bedside commode/3-in-1 Prior Function Level of Independence: Independent with assistive device(s) Able to Take Stairs?: Yes Driving: No Communication Communication: No difficulties Dominant Hand: Right         Vision/Perception Vision - History Baseline Vision: Wears glasses all the time Patient Visual Report: No change from baseline Perception Perception: Within Functional Limits   Cognition  Cognition Overall Cognitive Status: Appears within functional limits for tasks assessed/performed Arousal/Alertness: Awake/alert Orientation Level: Oriented X4 / Intact Behavior During Session: [facility] for tasks performed    Extremity/Trunk Assessment Right Upper Extremity Assessment RUE ROM/Strength/Tone: [facility] for tasks assessed Left Upper Extremity Assessment LUE ROM/Strength/Tone: [facility] for tasks assessed     Mobility Bed Mobility Bed Mobility: Supine to Sit;Sit to Supine;Sitting - Scoot to Edge of Bed Supine to Sit: 4: Min assist Sitting - Scoot to [place] of Bed: 5: Supervision Sit to Supine: 4: Min assist Details for Bed Mobility Assistance: assist with L LE Transfers Transfers: Sit to Stand;Stand to Sit Sit to Stand: 4: Min assist;With upper extremity assist;From chair/3-in-1 Stand to Sit: 4: Min guard;With upper extremity assist;To chair/3-in-1 Details for Transfer Assistance: verbal cues for correct hand placement and sequencing     Exercise     Balance Balance Balance Assessed: No   End of  Session OT - End of Session Equipment Utilized During Treatment: Gait belt;Left knee immobilizer;Other (comment) (BSC, ADL A/E, R knee brace) Activity Tolerance: Patient limited by fatigue;Patient limited by pain Patient left: in bed;with call bell/phone within reach;with family/visitor present  GO     Galen Manila 08/20/2012, 12:01 PM

## 2012-08-20 NOTE — Consult Note (Signed)
[facility]     Reason for Consult: Staph aureus prosthetic knee infection    Referring Physician: Dr. Turner Daniels  Active Problems:   * No active hospital problems. *   . amLODipine  10 mg Oral Daily  . aspirin EC  325 mg Oral BID  . atenolol  12.5 mg Oral Daily  . baclofen  10 mg Oral TID  .  ceFAZolin (ANCEF) IV  2 g Intravenous Q8H  . clonazePAM  0.5 mg Oral QHS  . docusate sodium  100 mg Oral Daily  . DULoxetine  60 mg Oral Daily  . gabapentin  100 mg Oral TID  . heparin lock flush  500 Units Intracatheter Q30 days  . losartan  100 mg Oral Daily   And  . hydrochlorothiazide  25 mg Oral Daily  . OxyCODONE  20 mg Oral Q12H  . pantoprazole  80 mg Oral Q1200    Recommendations: Ancef pending sensitivities (previously MSSA), will need 6 week course IV Consult [facility] (Dr. Jamey Ripa) for consideration of port-a-cath removal due to persistent Staph aureus infection and bacteremia in the past PICC after port-a-cath removal Echocardiogram (ordered) Check blood cultures (done)  Assessment: She has likely a persistence of the infection in her knee that now has required removal of the prosthesis.  The difficulty is the possibility of another infection if the Staph aureus is stuck to the port-a-cath and can again reestablish bacteremia, with the further possibility of PJI again after it is again replaced, or even of endocarditis.    Antibiotics: ancef  HPI: Julie Ross is a 52 y.o. 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  She had a port-a-cath removed at that time and replaced again in November 2013.  She was doing well recently but then developed swelling in the knee and required operative 2 stage removal with spacer placement.  Her outpatient cultures are growing Staph aureus by report (awaiting report from lab to confirm).  She has been  on Ancef for several days, keflex prior to that.  No blood cultures have been obtained.     Review of Systems: Pertinent items are noted in HPI.  Past Medical History  Diagnosis Date  . Hypertension   . Hypothyroidism   . Arthritis   . Neuromuscular disorder     MS TX WITH CYMBALTA   . Multiple sclerosis   . H/O colonoscopy   . H/O bone density study 03/2011  . Wears glasses   . Heart burn   . Depression   . Sleep apnea     MILD SLEEP APNEA, NO MACHINE  6 YRS AGO HPT [facility]   . Breast cancer     lul/ER+PR+    History  Substance Use Topics  . Smoking status: Never Smoker   . Smokeless tobacco: Never Used  . Alcohol Use: No    Family History  Problem Relation Age of Onset  . Breast cancer Paternal Aunt   . Breast cancer Maternal Grandmother   . Heart disease Maternal Grandfather    No Known Allergies  OBJECTIVE: Blood pressure 123/55, pulse 74, temperature 98 F (36.7 C), temperature source Oral, resp. rate 18, height 5\' 6"  (1.676 m), weight 190 lb (86.183 kg), SpO2 99.00%. General: awake, alert, nad Skin: no rashes Lungs: CTA B Cor: RRR without m/r/g Abdomen: soft, ntnd, +bs Ext: leg wrapped  Microbiology: Recent  Results (from the past 240 hour(s))  BODY FLUID CULTURE     Status: None   Collection Time    08/18/12  4:15 PM      Result Value Range Status   Specimen Description SYNOVIAL FLUID LEFT KNEE   Final   Special Requests NONE   Final   Gram Stain     Final   Value: MODERATE WBC PRESENT, PREDOMINANTLY PMN     RARE GRAM POSITIVE COCCI IN PAIRS   Culture PENDING   Incomplete   Report Status PENDING   Incomplete  ANAEROBIC CULTURE     Status: None   Collection Time    08/18/12  4:15 PM      Result Value Range Status   Specimen Description SYNOVIAL FLUID LEFT KNEE   Final   Special Requests NONE   Final   Gram Stain PENDING   Incomplete   Culture     Final   Value: NO ANAEROBES ISOLATED; CULTURE IN PROGRESS FOR 5 DAYS   Report Status PENDING    Incomplete    Staci Righter, MD [facility] [phone] pager  [phone] cell 08/20/2012, 12:32 PM

## 2012-08-20 NOTE — Plan of Care (Signed)
Problem: Phase II Progression Outcomes Goal: Discharge plan established Recommend HH OT for ADL trg and ADL mobility safety trg     

## 2012-08-21 DIAGNOSIS — I517 Cardiomegaly: Secondary | ICD-10-CM

## 2012-08-21 DIAGNOSIS — T827XXA Infection and inflammatory reaction due to other cardiac and vascular devices, implants and grafts, initial encounter: Secondary | ICD-10-CM

## 2012-08-21 LAB — TYPE AND SCREEN
ABO/RH(D): O POS
Antibody Screen: NEGATIVE
Unit division: 0
Unit division: 0

## 2012-08-21 LAB — CBC
HCT: 27.9 % — ABNORMAL LOW (ref 36.0–46.0)
Hemoglobin: 9.3 g/dL — ABNORMAL LOW (ref 12.0–15.0)
MCH: 27.7 pg (ref 26.0–34.0)
MCHC: 33.3 g/dL (ref 30.0–36.0)
MCV: 83 fL (ref 78.0–100.0)
Platelets: 216 10*3/uL (ref 150–400)
RBC: 3.36 MIL/uL — ABNORMAL LOW (ref 3.87–5.11)
RDW: 15.4 % (ref 11.5–15.5)
WBC: 7.4 10*3/uL (ref 4.0–10.5)

## 2012-08-21 MED ORDER — CEFAZOLIN SODIUM-DEXTROSE 2-3 GM-% IV SOLR
2.0000 g | INTRAVENOUS | Status: AC
  Administered 2012-08-22: 2 g via INTRAVENOUS
  Filled 2012-08-21: qty 50

## 2012-08-21 NOTE — Progress Notes (Signed)
Occupational Therapy Treatment Patient Details Name: Julie Ross MRN: [id number] DOB: [birth year] Today's Date: 08/21/2012 Time: 9562-1308 OT Time Calculation (min): 26 min  OT Assessment / Plan / Recommendation Comments on Treatment Session Pt making progress and doing well, pt more cheerful today, more talkative. Pt should continue with OT services to maximize independence and level of function to return home safely    Follow Up Recommendations  Home health OT;Supervision/Assistance - 24 hour    Barriers to Discharge   None    Equipment Recommendations  None recommended by OT    Recommendations for Other Services    Frequency Min 2X/week   Plan Discharge plan remains appropriate    Precautions / Restrictions Precautions Precautions: Knee Required Braces or Orthoses: Knee Immobilizer - Left;Other Brace/Splint Knee Immobilizer - Left: On at all times Other Brace/Splint: Patient has knee brace from doctors office that she uses on R LE when ambulating Restrictions Weight Bearing Restrictions: Yes LLE Weight Bearing: Partial weight bearing LLE Partial Weight Bearing Percentage or Pounds: 30%   Pertinent Vitals/Pain     ADL  Grooming: Performed;Wash/dry hands;Wash/dry face;Set up Where Assessed - Grooming: Unsupported sitting Toilet Transfer: Minimal assistance Toilet Transfer Method: Sit to stand Toilet Transfer Equipment: Bedside commode Toileting - Clothing Manipulation and Hygiene: Performed;Moderate assistance;Minimal assistance (min A for balance with hygiene) Where Assessed - Toileting Clothing Manipulation and Hygiene: Standing Transfers/Ambulation Related to ADLs: pt required verbal cues for correct hand and LE position, pt unable to tolerate ambulating into bathroom, used BSC instead ADL Comments: pt requires increased time to comlpete toileting, pt needed to urinate agian after completing hygiene from intial urination    OT Diagnosis:    OT Problem List:   OT  Treatment Interventions:     OT Goals ADL Goals ADL Goal: Toilet Transfer - Progress: Progressing toward goals ADL Goal: Toileting - Clothing Manipulation - Progress: Progressing toward goals ADL Goal: Toileting - Hygiene - Progress: Progressing toward goals  Visit Information  Last OT Received On: 08/21/12    Subjective Data  Subjective: " I would like to go to the bathroom ' Patient Stated Goal: To return home   Prior Functioning       Cognition  Cognition Overall Cognitive Status: Appears within functional limits for tasks assessed/performed Arousal/Alertness: Awake/alert Orientation Level: Oriented X4 / Intact Behavior During Session: [facility] for tasks performed    Mobility  Bed Mobility Bed Mobility: Supine to Sit;Sit to Supine;Sitting - Scoot to Edge of Bed Supine to Sit: With rails;4: Min assist Sitting - Scoot to Edge of Bed: 5: Supervision Sit to Supine: 4: Min assist Details for Bed Mobility Assistance: assist with L LE Transfers Transfers: Sit to Stand;Stand to Sit Sit to Stand: 4: Min assist;With upper extremity assist;From chair/3-in-1;From bed Stand to Sit: 4: Min guard;With upper extremity assist;To chair/3-in-1;To bed Details for Transfer Assistance: verbal cues for correct hand placement and sequencing. A to ensure balance and to initate anterior weight shift with stand. Cues to sit slowly    Exercises      Balance Balance Balance Assessed: No   End of Session OT - End of Session Equipment Utilized During Treatment: Gait belt;Left knee immobilizer;Other (comment) (BSC) Activity Tolerance: Patient limited by fatigue;Patient limited by pain Patient left: in bed;with call bell/phone within reach;with family/visitor present  GO     Galen Manila 08/21/2012, 4:01 PM

## 2012-08-21 NOTE — Consult Note (Signed)
GENERAL SURGERY CONSULT NOTE  Julie Ross [facility] 02/18/1961  [id number].   Primary Care MD: [name] Requesting MD: Dr. Luciana Axe (ID) Attending MD: Dr. Luiz Blare (Ortho) General surgery:  Dr. Cyndia Bent  Chief Complaint/Reason for Consult:   -MSSA Prosthetic Joint Infection with need for Porta-Cath Removal due to concerns for seeding  HPI:  This is a chronically ill woman with breast cancer and recurrent infection of the left knee.  She just underwent:  Radical irrigation and debridement of recurrent infection left total knee with removal of all implants, placement of p.m. and a spacer with 6 g of Zinacef in the cement, and 2 Hemovac drains on 08/18/12.   She has had 2 units of PRBCs post op for anemia.  She has a Porta cath placed for her chemotherapy for breast cancer, which was removed last September and replaced in November.  She has been seen by Dr. Luciana Axe from ID and he is concerned she has an MSSA infection related to her porta cath, and is at risk of seeding from here.  It was his opinion the [facility] cath should be removed, with PICC line placement after, for her ongoing chemotherapy.   ROS: Poor appetite, no measurable wt. Loss.  No fever recorded, but she feels warm to exam.  + fatigue, HENT: NEGATIVE PULM: Negative CArd: negative GI:  Poor appetite GU: negative  MS: Walks with PT help Large Boot and dressing/splint on the left.Brace on the right. Skin: negative     PMHx:  Past Medical History  Diagnosis Date  . Hypertension   . Hypothyroidism   . Osteoarthritis with failed knee replacement secondary to infection on the left.  She also has a brace on the right and needs her right side replaced.   . Multiple sclerosis/  MS TX WITH CYMBALTA         Left Breast cancer S/P left mastectomy:invasive ductal carcinoma that is ER positive PR positive HER-2/neu positive with Ki-67 11%.  With radiation and ongoing chemotherapy    . Depression   . Sleep apnea     MILD SLEEP  APNEA, NO MACHINE  6 YRS AGO HPT [facility]     PSHx: Past Surgical History  Procedure Laterality Date  . Tonsillectomy    . Tumor removal      FROM PELVIS AGE 24  . Cesarean section      X2   . No past surgeries      BLADDER SLING  4-5 YRS AGO   . Knee arthroscopy      LT KNEE 04/2011  . Total knee arthroplasty  08/15/2011    Procedure: TOTAL KNEE ARTHROPLASTY; lft Surgeon: Harvie Junior, MD;  Location: [facility] OR;  Service: Orthopedics;  Laterality: Left;  RIGHT KNEE CORTIZONE INJECTION  . Mohs surgery      right face  . Knee arthroscopy  82    rt  . Joint replacement  Feb 2013  . Mastectomy w/ sentinel node biopsy  12/19/2011    Procedure: MASTECTOMY WITH SENTINEL LYMPH NODE BIOPSY;  Surgeon: Currie Paris, MD;  Location: [facility] OR;  Service: General;  Laterality: Left;  left breast and left axilla   . Portacath placement  01/16/2012    Procedure: INSERTION PORT-A-CATH;  Surgeon: Currie Paris, MD;  Location: [facility];  Service: General;  Laterality: Right;  Porta Cath Placement   . I&d knee with poly exchange  03/02/2012    Procedure: IRRIGATION AND DEBRIDEMENT KNEE WITH POLY  EXCHANGE;  Surgeon: Harvie Junior, MD;  Location: [facility] OR;  Service: Orthopedics;  Laterality: Left;  I&D of total knee with Possible Poly Exchange   . Tee without cardioversion  03/06/2012    Procedure: TRANSESOPHAGEAL ECHOCARDIOGRAM (TEE);  Surgeon: Wendall Stade, MD;  Location: [facility] ENDOSCOPY;  Service: Cardiovascular;  Laterality: N/A;  Rm. [id number]  . Port-a-cath removal  03/06/2012    Procedure: REMOVAL PORT-A-CATH;  Surgeon: Adolph Pollack, MD;  Location: [facility] OR;  Service: General;  Laterality: N/A;  . Portacath placement  05/01/2012    Procedure: INSERTION PORT-A-CATH;  Surgeon: Currie Paris, MD;  Location: [facility];  Service: General;  Laterality: Right;  right internal jugular port-a-cath insertion  . Total knee arthroplasty Left 08/18/2012    Procedure:  Irrigation and  debridement of LEFT total knee;  Removal of total knee parts; Implant of spacers;  Surgeon: Nestor Lewandowsky, MD;  Location: [facility] OR;  Service: Orthopedics;  Laterality: Left;    Social Hx: History   Social History  . Marital Status: Married    Spouse Name: N/A    Number of Children: N/A  . Years of Education: N/A   Social History Main Topics  . Smoking status: Never Smoker   . Smokeless tobacco: Never Used  . Alcohol Use: No  . Drug Use: No  . Sexually Active: Yes   Other Topics Concern  . None   Social History Narrative  . None    Family Hx: Family History  Problem Relation Age of Onset  . Breast cancer Paternal Aunt   . Breast cancer Maternal Grandmother   . Heart disease Maternal Grandfather     Allergies: No Known Allergies  Home Medications: Prescriptions prior to admission  Medication Sig Dispense Refill  . ALPRAZolam (XANAX) 0.5 MG tablet Take 1 tablet (0.5 mg total) by mouth 3 (three) times daily as needed for anxiety.  15 tablet  0  . amLODipine (NORVASC) 10 MG tablet Take 1 tablet (10 mg total) by mouth daily.  30 tablet  3  . atenolol (TENORMIN) 25 MG tablet Take 0.5 tablets (12.5 mg total) by mouth daily. For blood pressure  15 tablet  0  . baclofen (LIORESAL) 10 MG tablet 10 mg 3 (three) times daily.       Marland Kitchen CALCIUM-MAGNESIUM PO Take 1 tablet by mouth daily.      . clonazePAM (KLONOPIN) 0.5 MG tablet Take 0.5 mg by mouth at bedtime.       . Diclofenac-Misoprostol 75-0.2 MG TBEC Take 75 mg by mouth Twice daily as needed (for inflammation). Take 1 by mouth twice daily as needed      . docusate sodium (COLACE) 100 MG capsule Take 100 mg by mouth daily.      . DULoxetine (CYMBALTA) 60 MG capsule Take 60 mg by mouth daily.       Marland Kitchen esomeprazole (NEXIUM) 40 MG capsule Take 40 mg by mouth daily before breakfast.      . gabapentin (NEURONTIN) 100 MG capsule Take 1 capsule (100 mg total) by mouth 3 (three) times daily. For neuropathy  90 capsule  1  .  HYDROcodone-acetaminophen (NORCO) 10-325 MG per tablet Take 1 tablet by mouth every 6 (six) hours as needed for pain.      . hydrOXYzine (ATARAX/VISTARIL) 25 MG tablet Take 25 mg by mouth every 8 (eight) hours as needed for itching.       . IRON PO Take 45 mg by mouth  every other day.       . losartan-hydrochlorothiazide (HYZAAR) 100-25 MG per tablet Take 1 tablet by mouth daily.       . Multiple Vitamins-Minerals (MULTIVITAMINS THER. W/MINERALS) TABS Take 1 tablet by mouth daily.      . ondansetron (ZOFRAN) 8 MG tablet Take 8 mg by mouth every 8 (eight) hours as needed for nausea.       . Probiotic Product (PROBIOTIC DAILY PO) Take 1 capsule by mouth daily.       . prochlorperazine (COMPAZINE) 10 MG tablet TAKE 1 TABLET BY MOUTH EVERY 6 HOURS AS NEEDED FOR NAUSEA AND VOMITING  30 tablet  0   Current Facility-Administered Medications on File Prior to Encounter  Medication Dose Route Frequency Provider Last Rate Last Dose  . ceFAZolin (ANCEF) IVPB 2 g/50 mL premix    PRN Lance Coon, CRNA   2 g at 08/18/12 1553  . dexamethasone (DECADRON) injection    PRN Lance Coon, CRNA   8 mg at 05/01/12 0908  . fentaNYL (SUBLIMAZE) injection    PRN Lance Coon, CRNA   100 mcg at 05/01/12 0856  . lidocaine (cardiac) 100 mg/30ml (XYLOCAINE) 20 MG/ML injection 2%    PRN Lance Coon, CRNA   60 mg at 05/01/12 0856  . midazolam (VERSED) 5 MG/5ML injection    PRN Lance Coon, CRNA   2 mg at 05/01/12 0856  . propofol (DIPRIVAN) 10 mg/mL bolus/IV push    PRN Lance Coon, CRNA   160 mg at 05/01/12 0900   Current Outpatient Prescriptions on File Prior to Encounter  Medication Sig Dispense Refill  . ALPRAZolam (XANAX) 0.5 MG tablet Take 1 tablet (0.5 mg total) by mouth 3 (three) times daily as needed for anxiety.  15 tablet  0  . amLODipine (NORVASC) 10 MG tablet Take 1 tablet (10 mg total) by mouth daily.  30 tablet  3  . atenolol (TENORMIN) 25 MG tablet Take 0.5 tablets (12.5 mg total) by mouth  daily. For blood pressure  15 tablet  0  . baclofen (LIORESAL) 10 MG tablet 10 mg 3 (three) times daily.       Marland Kitchen CALCIUM-MAGNESIUM PO Take 1 tablet by mouth daily.      . clonazePAM (KLONOPIN) 0.5 MG tablet Take 0.5 mg by mouth at bedtime.       . Diclofenac-Misoprostol 75-0.2 MG TBEC Take 75 mg by mouth Twice daily as needed (for inflammation). Take 1 by mouth twice daily as needed      . docusate sodium (COLACE) 100 MG capsule Take 100 mg by mouth daily.      . DULoxetine (CYMBALTA) 60 MG capsule Take 60 mg by mouth daily.       Marland Kitchen esomeprazole (NEXIUM) 40 MG capsule Take 40 mg by mouth daily before breakfast.      . gabapentin (NEURONTIN) 100 MG capsule Take 1 capsule (100 mg total) by mouth 3 (three) times daily. For neuropathy  90 capsule  1  . hydrOXYzine (ATARAX/VISTARIL) 25 MG tablet Take 25 mg by mouth every 8 (eight) hours as needed for itching.       . IRON PO Take 45 mg by mouth every other day.       . losartan-hydrochlorothiazide (HYZAAR) 100-25 MG per tablet Take 1 tablet by mouth daily.       . Multiple Vitamins-Minerals (MULTIVITAMINS THER. W/MINERALS) TABS Take 1 tablet by mouth daily.      . ondansetron (ZOFRAN) 8 MG tablet Take  8 mg by mouth every 8 (eight) hours as needed for nausea.       . Probiotic Product (PROBIOTIC DAILY PO) Take 1 capsule by mouth daily.       . prochlorperazine (COMPAZINE) 10 MG tablet TAKE 1 TABLET BY MOUTH EVERY 6 HOURS AS NEEDED FOR NAUSEA AND VOMITING  30 tablet  0   . sodium chloride 100 mL/hr at 08/20/12 0836    Diet: regular  OBJECTIVE  Vitals: Temp:  [97.7 F (36.5 C)-98.6 F (37 C)] 97.9 F (36.6 C) (03/04 0516) Pulse Rate:  [62-84] 76 (03/04 0516) Resp:  [16-28] 16 (03/04 0516) BP: (97-124)/(50-69) 124/66 mmHg (03/04 0516) SpO2:  [95 %-100 %] 100 % (03/04 0516)  Weight: Wt Readings from Last 3 Encounters:  08/19/12 190 lb (86.183 kg)  08/19/12 190 lb (86.183 kg)  08/17/12 196 lb 1.6 oz (88.95 kg)    I&Os: Yesterday: 03/03  0701 - 03/04 0700 In: 872.5 [P.O.:360; I.V.:500; Blood:12.5] Out: 700 [Urine:700] This shift:     PE:  Well nourished, but fatigued WF NAD HENT: WNL Neck: no bruits, no JVD, no thyromegaly.  She has palpable IV line present right neck CHEST:  Clear to Ascultation Porta cath on the right. S/p left mastectomy Cardiac:  No mumur or rub, Norm S1,S2. ABd: soft, nontender, + bs, no masses. Exrem:  Well perfused, good distal pulses, large dressing and splint on left leg/knee,  brace on right knee. Skin: no rashes Neuro: no focal changes Psyc:  Fatigued, but otherwise normal affect, judgement.   LABS: CBC BMET   Recent Labs Lab 08/17/12 1022 08/19/12 0500 08/20/12 0500  WBC 8.1 12.8* 7.5  HGB 9.6* 8.0* 7.1*  HCT 29.2* 23.4* 21.6*  PLT 193 182 181    Recent Labs Lab 08/17/12 1023 08/19/12 0500  NA 137 132*  K 3.6 5.0  CL 100 98  CO2 25 26  BUN 17.6 15  CREATININE 1.3* 1.12*  GLUCOSE 123* 173*  CALCIUM 9.3 9.0           IMAGING: No results found.  Medications:   . sodium chloride 100 mL/hr at 08/20/12 0836   . amLODipine  10 mg Oral Daily  . aspirin EC  325 mg Oral BID  . atenolol  12.5 mg Oral Daily  . baclofen  10 mg Oral TID  .  ceFAZolin (ANCEF) IV  2 g Intravenous Q8H  . clonazePAM  0.5 mg Oral QHS  . docusate sodium  100 mg Oral Daily  . DULoxetine  60 mg Oral Daily  . gabapentin  100 mg Oral TID  . heparin lock flush  500 Units Intracatheter Q30 days  . losartan  100 mg Oral Daily   And  . hydrochlorothiazide  25 mg Oral Daily  . OxyCODONE  20 mg Oral Q12H  . pantoprazole  80 mg Oral Q1200   acetaminophen, acetaminophen, ALPRAZolam, alum & mag hydroxide-simeth, bisacodyl, diphenhydrAMINE, heparin lock flush, HYDROmorphone (DILAUDID) injection, hydrOXYzine, magnesium hydroxide, menthol-cetylpyridinium, metoCLOPramide (REGLAN) injection, metoCLOPramide, ondansetron (ZOFRAN) IV, ondansetron, ondansetron, oxyCODONE, phenol, prochlorperazine, sodium  chloride, sodium chloride  Assessment & Plan  LOS: 31 52 y.o. year old female with hx of Breast CA who underwent removal of prosthetic joint s/p MSSA infection.  Needing Porta-Cath removal due to potential seeding with recurrent joint infection  # MSSA Prosthetic Joint Infection: Followed by Dr. Luiz Blare and Dr. Luciana Axe.  Defer further treatment for infection to primary Ortho and ID   # Vascular Access: Will plan for  porta-cath removal.  This is noted to be pt's second porta-cath.  Will likely need to consider placement of 3rd after infection cleared.  Pt will need PICC for temporary access for continued CHEMO; primary to arrange.    # Breast CA: followed by ONCOLOGY at [facility] CA center.    # Anemia: noted to have received 2 Units PRBC.  Will continue to monitor.  Check PT/INR     Hypothyroid  Multiple Sclerosis Depression Sleep Apnea, no CPAP        Disposition   I will talk with Dr. Johna Sheriff and aim to get her on the schedule for [facility] cath removal tomorrow.  Her current IV access is thru her portacath.   Andrena Mews, DO [name] Resident - PGY- 08/21/2012 10:32 AM  Will Marlyne Beards [facility] for Dr. Johna Sheriff

## 2012-08-21 NOTE — Progress Notes (Signed)
  Echocardiogram 2D Echocardiogram has been performed.  Jorje Guild 08/21/2012, 3:22 PM

## 2012-08-21 NOTE — Progress Notes (Signed)
CARE MANAGEMENT NOTE 08/21/2012  Patient:  Julie Ross, Julie Ross   Account Number:  [id number]  Date Initiated:  08/21/2012  Documentation initiated by:  Vance Peper  Subjective/Objective Assessment:   52 yr old female s/p repeat I & D of left knee.     Action/Plan:   patientwill resume care with [facility].   Anticipated DC Date:  08/22/2012   Anticipated DC Plan:  HOME W HOME HEALTH SERVICES      DC Planning Services  CM consult      [facility] Choice  Resumption Of Svcs/PTA [place]olis Diel   Choice offered to / List presented to:          [facility] arranged  HH-2 PT  HH-1 RN      [facility] agency  [facility]   Status of service:  Completed, signed off Medicare Important Message given?   (If response is "NO", the following Medicare IM given date fields will be blank) Date Medicare IM given:   Date Additional Medicare IM given:    Discharge Disposition:  HOME W HOME HEALTH SERVICES  Per UR Regulation:    If discussed at Long Length of Stay Meetings, dates discussed:    Comments:

## 2012-08-21 NOTE — Consult Note (Signed)
Patient interviewed and examined, agree with PA note above. I discussed Port-A-Cath removal with the patient including the indications and nature of the procedure and minor risks of bleeding infection and wound healing. She understands and agrees to proceed.  Mariella Saa MD, FACS  08/21/2012 6:46 PM

## 2012-08-21 NOTE — Progress Notes (Signed)
Physical Therapy Treatment Patient Details Name: Julie Ross MRN: [id number] DOB: [birth year] Today's Date: 08/21/2012 Time: 1001-1027 PT Time Calculation (min): 26 min  PT Assessment / Plan / Recommendation Comments on Treatment Session  Patient agreeable to attempt ambuation in room. Patient fatique quickly and requires max encouragement at this time. HUsband and daugther present throughout and husband encouraging patient throughout. Requires increased time for all mobility    Follow Up Recommendations  Home health PT;Supervision/Assistance - 24 hour     Does the patient have the potential to tolerate intense rehabilitation     Barriers to Discharge        Equipment Recommendations  None recommended by PT    Recommendations for Other Services    Frequency Min 6X/week   Plan Discharge plan remains appropriate;Frequency remains appropriate    Precautions / Restrictions Precautions Precautions: Knee Required Braces or Orthoses: Knee Immobilizer - Left;Other Brace/Splint Knee Immobilizer - Left: On at all times Other Brace/Splint: Patient has knee brace from doctors office that she uses on R LE when ambulating Restrictions LLE Weight Bearing: Partial weight bearing   Pertinent Vitals/Pain     Mobility  Bed Mobility Supine to Sit: With rails;4: Min assist Sitting - Scoot to Edge of Bed: 5: Supervision Details for Bed Mobility Assistance: assist with L LE Transfers Sit to Stand: 4: Min assist;With upper extremity assist;From chair/3-in-1;From bed Stand to Sit: 4: Min guard;With upper extremity assist;To chair/3-in-1;To bed Details for Transfer Assistance: verbal cues for correct hand placement and sequencing. A to ensure balance and to initate anterior weight shift with stand. Cues to sit slowly Ambulation/Gait Ambulation/Gait Assistance: 4: Min guard Ambulation Distance (Feet): 30 Feet Assistive device: Rolling walker Ambulation/Gait Assistance Details: Cues not to lean  onto RW with forearm. Cues for gait sequence and posture Gait Pattern: Step-to pattern;Antalgic;Trunk flexed Gait velocity: decreased    Exercises Total Joint Exercises Ankle Circles/Pumps: AROM;Both;10 reps   PT Diagnosis:    PT Problem List:   PT Treatment Interventions:     PT Goals Acute Rehab PT Goals PT Goal: Supine/Side to Sit - Progress: Progressing toward goal PT Goal: Sit to Stand - Progress: Progressing toward goal PT Goal: Stand to Sit - Progress: Progressing toward goal PT Goal: Ambulate - Progress: Progressing toward goal  Visit Information  Last PT Received On: 08/21/12 Assistance Needed: +1    Subjective Data      Cognition  Cognition Overall Cognitive Status: Appears within functional limits for tasks assessed/performed Arousal/Alertness: Awake/alert Orientation Level: Oriented X4 / Intact Behavior During Session: [facility] for tasks performed    Balance     End of Session PT - End of Session Equipment Utilized During Treatment: Gait belt;Right knee immobilizer Activity Tolerance: Patient tolerated treatment well Patient left: with call bell/phone within reach;in chair;with family/visitor present Nurse Communication: Mobility status   GP     Fredrich Birks 08/21/2012, 11:15 AM 08/21/2012 Fredrich Birks PTA [phone] pager [phone] office

## 2012-08-21 NOTE — Progress Notes (Signed)
Subjective: 3 Days Post-Op Procedure(s) (LRB):  Irrigation and debridement of LEFT total knee;  Removal of total knee parts; Implant of spacers (Left) Patient reports pain as moderate.    Objective: Vital signs in last 24 hours: Temp:  [97.7 F (36.5 C)-98.6 F (37 C)] 97.9 F (36.6 C) (03/04 0516) Pulse Rate:  [62-84] 76 (03/04 0516) Resp:  [16-28] 16 (03/04 0516) BP: (97-124)/(50-69) 124/66 mmHg (03/04 0516) SpO2:  [95 %-100 %] 100 % (03/04 0516)  Intake/Output from previous day: 03/03 0701 - 03/04 0700 In: 872.5 [P.O.:360; I.V.:500; Blood:12.5] Out: 700 [Urine:700] Intake/Output this shift:     Recent Labs  08/19/12 0500 08/20/12 0500  HGB 8.0* 7.1*    Recent Labs  08/19/12 0500 08/20/12 0500  WBC 12.8* 7.5  RBC 2.89* 2.60*  HCT 23.4* 21.6*  PLT 182 181    Recent Labs  08/19/12 0500  NA 132*  K 5.0  CL 98  CO2 26  BUN 15  CREATININE 1.12*  GLUCOSE 173*  CALCIUM 9.0   No results found for this basename: LABPT, INR,  in the last 72 hours  Neurologically intact ABD soft Neurovascular intact Sensation intact distally Intact pulses distally No cellulitis present Compartment soft  Assessment/Plan: 3 Days Post-Op Procedure(s) (LRB):  Irrigation and debridement of LEFT total knee;  Removal of total knee parts; Implant of spacers (Left) Advance diet Up with therapy Continue iv abx  Need to clarify pulling porto cath or not Up more today  GRAVES,JOHN L 08/21/2012, 9:47 AM

## 2012-08-21 NOTE — Progress Notes (Signed)
[facility]  Date of Admission:  08/18/2012  Antibiotics: Ancef  Subjective: No acute events.  Objective: Temp:  [97.7 F (36.5 C)-98.6 F (37 C)] 97.9 F (36.6 C) (03/04 0516) Pulse Rate:  [62-84] 76 (03/04 0516) Resp:  [16-28] 16 (03/04 0516) BP: (97-124)/(50-69) 124/66 mmHg (03/04 0516) SpO2:  [95 %-100 %] 100 % (03/04 0516)  General: AAO x 3, nad Skin: no rashes Lungs: CTA B Cor: RRR without m/r/g  Lab Results Lab Results  Component Value Date   WBC 7.5 08/20/2012   HGB 7.1* 08/20/2012   HCT 21.6* 08/20/2012   MCV 83.1 08/20/2012   PLT 181 08/20/2012    Lab Results  Component Value Date   CREATININE 1.12* 08/19/2012   BUN 15 08/19/2012   NA 132* 08/19/2012   K 5.0 08/19/2012   CL 98 08/19/2012   CO2 26 08/19/2012    Lab Results  Component Value Date   ALT 91* 08/17/2012   AST 35* 08/17/2012   ALKPHOS 632* 08/17/2012   BILITOT 0.30 08/17/2012      Microbiology: Recent Results (from the past 240 hour(s))  BODY FLUID CULTURE     Status: None   Collection Time    08/18/12  4:15 PM      Result Value Range Status   Specimen Description SYNOVIAL FLUID LEFT KNEE   Final   Special Requests NONE   Final   Gram Stain     Final   Value: MODERATE WBC PRESENT, PREDOMINANTLY PMN     RARE GRAM POSITIVE COCCI IN PAIRS   Culture FEW STAPHYLOCOCCUS AUREUS   Final   Report Status PENDING   Incomplete  ANAEROBIC CULTURE     Status: None   Collection Time    08/18/12  4:15 PM      Result Value Range Status   Specimen Description SYNOVIAL FLUID LEFT KNEE   Final   Special Requests NONE   Final   Gram Stain PENDING   Incomplete   Culture     Final   Value: NO ANAEROBES ISOLATED; CULTURE IN PROGRESS FOR 5 DAYS   Report Status PENDING   Incomplete  CULTURE, BLOOD (ROUTINE X 2)     Status: None   Collection Time    08/20/12 11:30 AM      Result Value Range Status   Specimen Description BLOOD RIGHT ARM   Final   Special Requests BOTTLES DRAWN AEROBIC ONLY 6CC    Final   Culture  Setup Time 08/20/2012 19:08   Final   Culture     Final   Value:        BLOOD CULTURE RECEIVED NO GROWTH TO DATE CULTURE WILL BE HELD FOR 5 DAYS BEFORE ISSUING A FINAL NEGATIVE REPORT   Report Status PENDING   Incomplete  CULTURE, BLOOD (ROUTINE X 2)     Status: None   Collection Time    08/20/12 11:40 AM      Result Value Range Status   Specimen Description BLOOD RIGHT ARM   Final   Special Requests BOTTLES DRAWN AEROBIC ONLY 2CC   Final   Culture  Setup Time 08/20/2012 19:08   Final   Culture     Final   Value:        BLOOD CULTURE RECEIVED NO GROWTH TO DATE CULTURE WILL BE HELD FOR 5 DAYS BEFORE ISSUING A FINAL NEGATIVE REPORT   Report Status PENDING   Incomplete    Studies/Results: No  results found.  Assessment/Plan: 1) MSSA PJI - case discussed with Dr. Luiz Blare and discussed with oncology.  I do feel that the port-a-cath is at risk for seeding with the recurrence of the MSSA infection and I do feel removal of port-a-cath is optimal to avoid further complications.  I have asked surgery to evaluate for removal.  Additionally, she will need to have it replaced at some point but she is able to get the chemotherapy through a picc line so she will have a picc placed, but will need the catheter replaced later.   - continue with Ancef, recent culture MSSA  Staci Righter, MD [facility] [phone] pager   08/21/2012, 10:12 AM

## 2012-08-22 ENCOUNTER — Encounter (HOSPITAL_COMMUNITY)
Admission: EM | Disposition: A | Discharge status: Home Health | Payer: Self-pay | Source: Home / Self Care | Attending: Orthopedic Surgery

## 2012-08-22 ENCOUNTER — Encounter (HOSPITAL_COMMUNITY): Payer: Self-pay | Admitting: Anesthesiology

## 2012-08-22 ENCOUNTER — Inpatient Hospital Stay (HOSPITAL_COMMUNITY): Payer: BC Managed Care – PPO | Admitting: Anesthesiology

## 2012-08-22 HISTORY — PX: PORT-A-CATH REMOVAL: SHX5289

## 2012-08-22 SURGERY — REMOVAL PORT-A-CATH
Anesthesia: General | Site: Chest | Laterality: Right | Wound class: Clean

## 2012-08-22 MED ORDER — BUPIVACAINE-EPINEPHRINE 0.25% -1:200000 IJ SOLN
INTRAMUSCULAR | Status: DC | PRN
  Administered 2012-08-22: 7 mL

## 2012-08-22 MED ORDER — SUCCINYLCHOLINE CHLORIDE 20 MG/ML IJ SOLN
INTRAMUSCULAR | Status: DC | PRN
  Administered 2012-08-22: 120 mg via INTRAVENOUS

## 2012-08-22 MED ORDER — CEFAZOLIN SODIUM-DEXTROSE 2-3 GM-% IV SOLR
INTRAVENOUS | Status: AC
  Filled 2012-08-22: qty 50

## 2012-08-22 MED ORDER — ARTIFICIAL TEARS OP OINT
TOPICAL_OINTMENT | OPHTHALMIC | Status: DC | PRN
  Administered 2012-08-22: 1 via OPHTHALMIC

## 2012-08-22 MED ORDER — LACTATED RINGERS IV SOLN
INTRAVENOUS | Status: DC
  Administered 2012-08-22: 09:00:00 via INTRAVENOUS

## 2012-08-22 MED ORDER — ONDANSETRON HCL 4 MG/2ML IJ SOLN
INTRAMUSCULAR | Status: DC | PRN
  Administered 2012-08-22: 4 mg via INTRAVENOUS

## 2012-08-22 MED ORDER — MIDAZOLAM HCL 5 MG/5ML IJ SOLN
INTRAMUSCULAR | Status: DC | PRN
  Administered 2012-08-22: 1 mg via INTRAVENOUS

## 2012-08-22 MED ORDER — 0.9 % SODIUM CHLORIDE (POUR BTL) OPTIME
TOPICAL | Status: DC | PRN
  Administered 2012-08-22: 1000 mL

## 2012-08-22 MED ORDER — LACTATED RINGERS IV SOLN
INTRAVENOUS | Status: DC | PRN
  Administered 2012-08-22: 10:00:00 via INTRAVENOUS

## 2012-08-22 MED ORDER — FENTANYL CITRATE 0.05 MG/ML IJ SOLN
INTRAMUSCULAR | Status: DC | PRN
  Administered 2012-08-22: 100 ug via INTRAVENOUS

## 2012-08-22 MED ORDER — EPHEDRINE SULFATE 50 MG/ML IJ SOLN
INTRAMUSCULAR | Status: DC | PRN
  Administered 2012-08-22: 10 mg via INTRAVENOUS

## 2012-08-22 MED ORDER — PROPOFOL 10 MG/ML IV BOLUS
INTRAVENOUS | Status: DC | PRN
  Administered 2012-08-22: 200 mg via INTRAVENOUS

## 2012-08-22 MED ORDER — LIDOCAINE HCL 2 % IJ SOLN
INTRAMUSCULAR | Status: AC
  Filled 2012-08-22: qty 20

## 2012-08-22 MED ORDER — HYDROMORPHONE HCL PF 1 MG/ML IJ SOLN: 0.2500 mg | INTRAMUSCULAR | Status: DC | PRN

## 2012-08-22 MED ORDER — BUPIVACAINE-EPINEPHRINE PF 0.25-1:200000 % IJ SOLN
INTRAMUSCULAR | Status: AC
  Filled 2012-08-22: qty 30

## 2012-08-22 MED ORDER — LIDOCAINE HCL (CARDIAC) 20 MG/ML IV SOLN
INTRAVENOUS | Status: DC | PRN
  Administered 2012-08-22: 30 mg via INTRAVENOUS

## 2012-08-22 MED ORDER — BUPIVACAINE HCL (PF) 0.25 % IJ SOLN
INTRAMUSCULAR | Status: AC
  Filled 2012-08-22: qty 30

## 2012-08-22 MED ORDER — SODIUM CHLORIDE 0.9 % IJ SOLN
10.0000 mL | INTRAMUSCULAR | Status: DC | PRN
  Administered 2012-08-27: 10 mL

## 2012-08-22 SURGICAL SUPPLY — 39 items
ADH SKN CLS LQ APL DERMABOND (GAUZE/BANDAGES/DRESSINGS) ×1
BLADE SURG 15 STRL LF DISP TIS (BLADE) ×1 IMPLANT
BLADE SURG 15 STRL SS (BLADE) ×2
CHLORAPREP W/TINT 10.5 ML (MISCELLANEOUS) ×2 IMPLANT
CLOTH BEACON ORANGE TIMEOUT ST (SAFETY) ×2 IMPLANT
COVER SURGICAL LIGHT HANDLE (MISCELLANEOUS) ×2 IMPLANT
DECANTER SPIKE VIAL GLASS SM (MISCELLANEOUS) ×2 IMPLANT
DERMABOND ADHESIVE PROPEN (GAUZE/BANDAGES/DRESSINGS) ×1
DERMABOND ADVANCED .7 DNX6 (GAUZE/BANDAGES/DRESSINGS) ×1 IMPLANT
DRAPE PED LAPAROTOMY (DRAPES) ×2 IMPLANT
DRAPE UTILITY 15X26 W/TAPE STR (DRAPE) ×4 IMPLANT
ELECT CAUTERY BLADE 6.4 (BLADE) ×2 IMPLANT
ELECT REM PT RETURN 9FT ADLT (ELECTROSURGICAL) ×2
ELECTRODE REM PT RTRN 9FT ADLT (ELECTROSURGICAL) ×1 IMPLANT
GAUZE SPONGE 4X4 16PLY XRAY LF (GAUZE/BANDAGES/DRESSINGS) ×2 IMPLANT
GLOVE BIOGEL PI IND STRL 7.0 (GLOVE) IMPLANT
GLOVE BIOGEL PI IND STRL 8 (GLOVE) ×1 IMPLANT
GLOVE BIOGEL PI INDICATOR 7.0 (GLOVE) ×2
GLOVE BIOGEL PI INDICATOR 8 (GLOVE) ×1
GLOVE SS BIOGEL STRL SZ 7.5 (GLOVE) ×1 IMPLANT
GLOVE SUPERSENSE BIOGEL SZ 7.5 (GLOVE) ×1
GLOVE SURG SS PI 7.0 STRL IVOR (GLOVE) ×1 IMPLANT
GOWN PREVENTION PLUS XLARGE (GOWN DISPOSABLE) ×2 IMPLANT
GOWN STRL NON-REIN LRG LVL3 (GOWN DISPOSABLE) ×2 IMPLANT
KIT BASIN OR (CUSTOM PROCEDURE TRAY) ×2 IMPLANT
KIT ROOM TURNOVER OR (KITS) ×2 IMPLANT
NDL HYPO 25GX1X1/2 BEV (NEEDLE) ×1 IMPLANT
NEEDLE HYPO 25GX1X1/2 BEV (NEEDLE) ×2 IMPLANT
NS IRRIG 1000ML POUR BTL (IV SOLUTION) ×2 IMPLANT
PACK SURGICAL SETUP 50X90 (CUSTOM PROCEDURE TRAY) ×2 IMPLANT
PAD ARMBOARD 7.5X6 YLW CONV (MISCELLANEOUS) ×4 IMPLANT
PENCIL BUTTON HOLSTER BLD 10FT (ELECTRODE) ×2 IMPLANT
SUT MON AB 4-0 PC3 18 (SUTURE) ×2 IMPLANT
SUT VIC AB 3-0 SH 27 (SUTURE) ×2
SUT VIC AB 3-0 SH 27XBRD (SUTURE) ×1 IMPLANT
SYR CONTROL 10ML LL (SYRINGE) ×2 IMPLANT
TOWEL OR 17X24 6PK STRL BLUE (TOWEL DISPOSABLE) ×2 IMPLANT
TOWEL OR 17X26 10 PK STRL BLUE (TOWEL DISPOSABLE) ×2 IMPLANT
WATER STERILE IRR 1000ML POUR (IV SOLUTION) IMPLANT

## 2012-08-22 NOTE — Progress Notes (Signed)
Subjective: Day of Surgery Procedure(s) (LRB): REMOVAL PORT-A-CATH (Right) And Removal of septic left total knee prosthesis on 08/18/12. Patient reports pain as 3 on 0-10 scale.   Appreciate ID and Gen Surg help.  Objective: Vital signs in last 24 hours: Temp:  [97.6 F (36.4 C)-98.8 F (37.1 C)] 98 F (36.7 C) (03/05 1202) Pulse Rate:  [73-89] 81 (03/05 1145) Resp:  [9-18] 16 (03/05 1202) BP: (112-135)/(49-68) 131/57 mmHg (03/05 1202) SpO2:  [95 %-99 %] 96 % (03/05 1202)  Intake/Output from previous day: 03/04 0701 - 03/05 0700 In: 1750 [P.O.:400; I.V.:1300; IV Piggyback:50] Out: 0  Intake/Output this shift: Total I/O In: 600 [I.V.:600] Out: -    Recent Labs  08/20/12 0500 08/21/12 0942  HGB 7.1* 9.3*    Recent Labs  08/20/12 0500 08/21/12 0942  WBC 7.5 7.4  RBC 2.60* 3.36*  HCT 21.6* 27.9*  PLT 181 216   No results found for this basename: NA, K, CL, CO2, BUN, CREATININE, GLUCOSE, CALCIUM,  in the last 72 hours No results found for this basename: LABPT, INR,  in the last 72 hours Left knee exam: Sensation intact distally Intact pulses distally Dorsiflexion/Plantar flexion intact Compartment soft Knee immobilizer in place on left leg.  Assessment/Plan: Day of Surgery Procedure(s) (LRB): REMOVAL PORT-A-CATH (Right) S/P I&D left knee,removal of TKA parts ,and placement of cement spacer. Plan: PICC line today.  Cont IV Ancef. Poss discharge home 1-2 days.   Julie Ross 08/22/2012, 1:46 PM

## 2012-08-22 NOTE — Op Note (Signed)
Preoperative Diagnosis: possible infected Port-A-Cath  Postoprative Diagnosis: same  Procedure: Procedure(s): REMOVAL PORT-A-CATH   Surgeon: Glenna Fellows T   Assistants: none  Anesthesia:  General endotracheal anesthesiaDiagnos  Indications:   Patient is a 52 year old female with a diagnosis of breast cancer and Port-A-Cath in place for chemotherapy. She recently had removal of a prosthetic joint due to staph infection. There is concern about seating of the Port-A-Cath and infectious disease has recommended removal. This was discussed with the patient and she is in agreement. The rest of anesthetic complications of bleeding and infection were discussed and understood.  Procedure Detail:  Patient brought to the operating room, placed in the supine position on the operating table, and general endotracheal anesthesia induced. The anterior chest was widely sterilely prepped and draped. She was already on broad-spectrum antibiotics. Patient timeout was performed the correct procedure verified. The previous scar was excised dissection was carried down cautery onto the port and the capsule opened. The hub was dissected free and then the catheter was withdrawn intact. The port and previous suture material were completely excised from the pocket with cautery. The port and tubing were sent for culture. Hemostasis was obtained with cautery. The subcutaneous was closed with running 3-0 Vicryl the skin with subcuticular 4-0 Monocryl and Dermabond. Sponge needle and instrument counts were correct.    Specimens: port and catheter sent for culture and sensitivity        Complications:  * No complications entered in OR log *         Disposition: PACU - hemodynamically stable.         Condition: stable  Mariella Saa MD, FACS  08/22/2012, 10:48 AM

## 2012-08-22 NOTE — Anesthesia Postprocedure Evaluation (Signed)
  Anesthesia Post-op Note  Patient: Julie Ross  Procedure(s) Performed: Procedure(s): REMOVAL PORT-A-CATH (Right)  Patient Location: PACU  Anesthesia Type:General  Level of Consciousness: awake  Airway and Oxygen Therapy: Patient Spontanous Breathing  Post-op Pain: mild  Post-op Assessment: Post-op Vital signs reviewed  Post-op Vital Signs: Reviewed  Complications: No apparent anesthesia complications

## 2012-08-22 NOTE — Anesthesia Preprocedure Evaluation (Addendum)
Anesthesia Evaluation  Patient identified by MRN, date of birth, ID band Patient awake  General Assessment Comment:History noted.  Reviewed: Allergy & Precautions, H&P , NPO status , Patient's Chart, lab work & pertinent test results  Airway Mallampati: II      Dental   Pulmonary sleep apnea ,  breath sounds clear to auscultation        Cardiovascular hypertension, Rhythm:Regular Rate:Normal     Neuro/Psych Anxiety    GI/Hepatic negative GI ROS, Neg liver ROS,   Endo/Other  Hypothyroidism   Renal/GU negative Renal ROS     Musculoskeletal   Abdominal   Peds  Hematology   Anesthesia Other Findings   Reproductive/Obstetrics                          Anesthesia Physical Anesthesia Plan  ASA: III  Anesthesia Plan: General   Post-op Pain Management:    Induction: Intravenous  Airway Management Planned: Oral ETT  Additional Equipment:   Intra-op Plan:   Post-operative Plan: Extubation in OR  Informed Consent: I have reviewed the patients History and Physical, chart, labs and discussed the procedure including the risks, benefits and alternatives for the proposed anesthesia with the patient or authorized representative who has indicated his/her understanding and acceptance.   Dental advisory given  Plan Discussed with:   Anesthesia Plan Comments:         Anesthesia Quick Evaluation

## 2012-08-22 NOTE — Anesthesia Postprocedure Evaluation (Signed)
  Anesthesia Post-op Note  Patient: Julie Ross  Procedure(s) Performed: Procedure(s): REMOVAL PORT-A-CATH (Right)  Patient Location: PACU  Anesthesia Type:General  Level of Consciousness: awake and alert   Airway and Oxygen Therapy: Patient Spontanous Breathing  Post-op Pain: mild  Post-op Assessment: Post-op Vital signs reviewed  Post-op Vital Signs: Reviewed  Complications: No apparent anesthesia complications

## 2012-08-22 NOTE — Anesthesia Procedure Notes (Signed)
Procedure Name: LMA Insertion Date/Time: 08/22/2012 10:10 AM Performed by: Luster Landsberg Pre-anesthesia Checklist: Patient identified, Emergency Drugs available, Suction available and Patient being monitored Patient Re-evaluated:Patient Re-evaluated prior to inductionOxygen Delivery Method: Circle system utilized Preoxygenation: Pre-oxygenation with 100% oxygen Intubation Type: IV induction Ventilation: Mask ventilation without difficulty and Oral airway inserted - appropriate to patient size LMA: LMA inserted LMA Size: 4.0 Tube type: Oral Number of attempts: 1 Placement Confirmation: positive ETCO2 and breath sounds checked- equal and bilateral Tube secured with: Tape Dental Injury: Teeth and Oropharynx as per pre-operative assessment  Comments: 1021 LMA d/c unable to ventilate -IV anectine given and propofol.  DVL X1 with MAC - unable to obtain view.  DVL x1 with glidescope - ETT 7.5 placed.

## 2012-08-22 NOTE — Progress Notes (Signed)
CARE MANAGEMENT NOTE 08/22/2012  Patient:  Julie Ross, Julie Ross   Account Number:  [id number]  Date Initiated:  08/21/2012  Documentation initiated by:  Vance Peper  Subjective/Objective Assessment:   53 yr old female s/p repeat I & D of left knee.     Action/Plan:   patientwill resume care with [facility].  Will have IV ancef for 6 weeks via PICC line.   Anticipated DC Date:  08/24/2012   Anticipated DC Plan:  HOME W HOME HEALTH SERVICES      DC Planning Services  CM consult      [facility] Choice  Resumption Of Svcs/PTA Katurah Karapetian   Choice offered to / List presented to:  C-3 Spouse        HH arranged  HH-2 PT  HH-1 RN  IV Antibiotics      HH agency  [facility]   Status of service:  Completed, signed off Medicare Important Message given?   (If response is "NO", the following Medicare IM given date fields will be blank) Date Medicare IM given:   Date Additional Medicare IM given:    Discharge Disposition:  HOME W HOME HEALTH SERVICES

## 2012-08-22 NOTE — Progress Notes (Signed)
Peripherally Inserted Central Catheter/Midline Placement  The IV Nurse has discussed with the patient and/or persons authorized to consent for the patient, the purpose of this procedure and the potential benefits and risks involved with this procedure.  The benefits include less needle sticks, lab draws from the catheter and patient may be discharged home with the catheter.  Risks include, but not limited to, infection, bleeding, blood clot (thrombus formation), and puncture of an artery; nerve damage and irregular heat beat.  Alternatives to this procedure were also discussed.  PICC/Midline Placement Documentation        Julie Ross, Julie Ross 08/22/2012, 6:43 PM

## 2012-08-22 NOTE — Progress Notes (Signed)
Physical Therapy Treatment Patient Details Name: Julie Ross MRN: [id number] DOB: 11-21-1960 Today's Date: 08/22/2012 Time: 2952-8413 PT Time Calculation (min): 23 min  PT Assessment / Plan / Recommendation Comments on Treatment Session  Patient agreeable to therapy this afternoon to ambulate in room. Patient was down earlier today getting cath romoved. Patient's husband stated that patient could possibly DC tomorrow if all goes well tonight. They are equiped with what they need at home but will need to attempt one step prior to DC home     Follow Up Recommendations  Home health PT;Supervision/Assistance - 24 hour     Does the patient have the potential to tolerate intense rehabilitation     Barriers to Discharge        Equipment Recommendations  None recommended by PT    Recommendations for Other Services    Frequency Min 6X/week   Plan Discharge plan remains appropriate;Frequency remains appropriate    Precautions / Restrictions Precautions Required Braces or Orthoses: Knee Immobilizer - Left;Other Brace/Splint Other Brace/Splint: Patient has knee brace from doctors office that she uses on R LE when ambulating Restrictions LLE Weight Bearing: Partial weight bearing   Pertinent Vitals/Pain no apparent distress     Mobility  Bed Mobility Supine to Sit: With rails;4: Min assist Sitting - Scoot to Edge of Bed: 5: Supervision Details for Bed Mobility Assistance: assist with L LE Transfers Sit to Stand: 4: Min guard;With upper extremity assist;From bed;From chair/3-in-1 Stand to Sit: 4: Min guard;To chair/3-in-1;With upper extremity assist Details for Transfer Assistance: verbal cues for correct hand placement  Ambulation/Gait Ambulation/Gait Assistance: 4: Min guard Ambulation Distance (Feet): 40 Feet Assistive device: Rolling walker Ambulation/Gait Assistance Details: Cues to stand upright and not lean onto RW Gait Pattern: Step-to pattern    Exercises     PT  Diagnosis:    PT Problem List:   PT Treatment Interventions:     PT Goals Acute Rehab PT Goals PT Goal: Supine/Side to Sit - Progress: Progressing toward goal PT Goal: Sit to Stand - Progress: Progressing toward goal PT Goal: Stand to Sit - Progress: Progressing toward goal PT Goal: Ambulate - Progress: Progressing toward goal  Visit Information  Last PT Received On: 08/22/12 Assistance Needed: +1    Subjective Data      Cognition  Cognition Overall Cognitive Status: Appears within functional limits for tasks assessed/performed Arousal/Alertness: Awake/alert Orientation Level: Appears intact for tasks assessed Behavior During Session: [facility] for tasks performed    Balance     End of Session PT - End of Session Equipment Utilized During Treatment: Gait belt;Right knee immobilizer Activity Tolerance: Patient tolerated treatment well Patient left: with call bell/phone within reach;in chair;with family/visitor present Nurse Communication: Mobility status   GP     Fredrich Birks 08/22/2012, 2:34 PM  08/22/2012 Fredrich Birks PTA [phone] pager [phone] office

## 2012-08-22 NOTE — Transfer of Care (Cosign Needed)
Immediate Anesthesia Transfer of Care Note  Patient: Julie Ross  Procedure(s) Performed: Procedure(s): REMOVAL PORT-A-CATH (Right)  Patient Location: PACU  Anesthesia Type:General  Level of Consciousness: awake  Airway & Oxygen Therapy: Patient Spontanous Breathing and Patient connected to nasal cannula oxygen  Post-op Assessment: Report given to PACU RN and Post -op Vital signs reviewed and stable  Post vital signs: Reviewed and stable  Complications: No apparent anesthesia complications

## 2012-08-23 ENCOUNTER — Telehealth (INDEPENDENT_AMBULATORY_CARE_PROVIDER_SITE_OTHER): Payer: Self-pay | Admitting: General Surgery

## 2012-08-23 ENCOUNTER — Encounter (HOSPITAL_COMMUNITY): Payer: Self-pay | Admitting: General Surgery

## 2012-08-23 ENCOUNTER — Ambulatory Visit: Payer: BC Managed Care – PPO | Admitting: Internal Medicine

## 2012-08-23 LAB — BODY FLUID CULTURE

## 2012-08-23 NOTE — Progress Notes (Signed)
Patient ID: Julie Ross, female   DOB: Nov 17, 1960, 52 y.o.   MRN: [id number]   LOS: 5 days  POD#1  Subjective: Feels groggy. No c/o about surgical site.  Objective: Vital signs in last 24 hours: Temp:  [97.6 F (36.4 C)-98.8 F (37.1 C)] 97.6 F (36.4 C) (03/06 0707) Pulse Rate:  [76-89] 80 (03/06 0707) Resp:  [9-18] 18 (03/06 0707) BP: (112-135)/(51-74) 130/74 mmHg (03/06 0707) SpO2:  [95 %-99 %] 99 % (03/06 0707) Last BM Date: 08/22/12   Physical Exam General appearance: alert and no distress Resp: clear to auscultation bilaterally Cardio: regular rate and rhythm and systolic murmur: early systolic 2/6, blowing at lower left sternal border GI: normal findings: bowel sounds normal and soft, non-tender Incision/Wound:No erythema or TTP, C/D/I   Assessment/Plan: S/p port-a-cath removal -- No wound issues. Surgery will sign off. Please call with questions.   Freeman Caldron, PA-C Pager: [phone] General Trauma PA Pager: [phone]   08/23/2012

## 2012-08-23 NOTE — Progress Notes (Signed)
[facility]  Date of Admission:  08/18/2012  Antibiotics: Ancef day 6  Subjective: Complaint of knee pain  Objective: Temp:  [97.6 F (36.4 C)-98.6 F (37 C)] 97.6 F (36.4 C) (03/06 0707) Pulse Rate:  [65-80] 65 (03/06 1407) Resp:  [16-18] 18 (03/06 1600) BP: (119-130)/(56-74) 119/56 mmHg (03/06 1407) SpO2:  [95 %-99 %] 96 % (03/06 1407)  General: Awake, alert, nad Skin: no rashes Lungs: CTA B Cor: RRR without m/r/g Abdomen: soft, ntnd, +bs   Lab Results Lab Results  Component Value Date   WBC 7.4 08/21/2012   HGB 9.3* 08/21/2012   HCT 27.9* 08/21/2012   MCV 83.0 08/21/2012   PLT 216 08/21/2012    Lab Results  Component Value Date   CREATININE 1.12* 08/19/2012   BUN 15 08/19/2012   NA 132* 08/19/2012   K 5.0 08/19/2012   CL 98 08/19/2012   CO2 26 08/19/2012    Lab Results  Component Value Date   ALT 91* 08/17/2012   AST 35* 08/17/2012   ALKPHOS 632* 08/17/2012   BILITOT 0.30 08/17/2012      Microbiology: Recent Results (from the past 240 hour(s))  BODY FLUID CULTURE     Status: None   Collection Time    08/18/12  4:15 PM      Result Value Range Status   Specimen Description SYNOVIAL FLUID LEFT KNEE   Final   Special Requests NONE   Final   Gram Stain     Final   Value: MODERATE WBC PRESENT, PREDOMINANTLY PMN     RARE GRAM POSITIVE COCCI IN PAIRS   Culture     Final   Value: FEW STAPHYLOCOCCUS AUREUS     Note: RIFAMPIN AND GENTAMICIN SHOULD NOT BE USED AS SINGLE DRUGS FOR TREATMENT OF STAPH INFECTIONS. This organism is presumed to be Clindamycin resistant based on detection of inducible Clindamycin resistance.   Report Status 08/23/2012 FINAL   Final   Organism ID, Bacteria STAPHYLOCOCCUS AUREUS   Final  ANAEROBIC CULTURE     Status: None   Collection Time    08/18/12  4:15 PM      Result Value Range Status   Specimen Description SYNOVIAL FLUID LEFT KNEE   Final   Special Requests NONE   Final   Gram Stain PENDING   Incomplete   Culture      Final   Value: NO ANAEROBES ISOLATED; CULTURE IN PROGRESS FOR 5 DAYS   Report Status PENDING   Incomplete  CULTURE, BLOOD (ROUTINE X 2)     Status: None   Collection Time    08/20/12 11:30 AM      Result Value Range Status   Specimen Description BLOOD RIGHT ARM   Final   Special Requests BOTTLES DRAWN AEROBIC ONLY 6CC   Final   Culture  Setup Time 08/20/2012 19:08   Final   Culture     Final   Value:        BLOOD CULTURE RECEIVED NO GROWTH TO DATE CULTURE WILL BE HELD FOR 5 DAYS BEFORE ISSUING A FINAL NEGATIVE REPORT   Report Status PENDING   Incomplete  CULTURE, BLOOD (ROUTINE X 2)     Status: None   Collection Time    08/20/12 11:40 AM      Result Value Range Status   Specimen Description BLOOD RIGHT ARM   Final   Special Requests BOTTLES DRAWN AEROBIC ONLY 2CC   Final   Culture  Setup Time 08/20/2012 19:08   Final   Culture     Final   Value:        BLOOD CULTURE RECEIVED NO GROWTH TO DATE CULTURE WILL BE HELD FOR 5 DAYS BEFORE ISSUING A FINAL NEGATIVE REPORT   Report Status PENDING   Incomplete  CATH TIP CULTURE     Status: None   Collection Time    08/22/12 10:26 AM      Result Value Range Status   Specimen Description CATH TIP   Final   Special Requests PORT A CATH PT ON ANCEF   Final   Culture NO GROWTH 1 DAY   Final   Report Status PENDING   Incomplete    Studies/Results: No results found.  Assessment/Plan: 1) Left PJI with MSSA, now s/p 2 stage revision - She has a history of MSSA bacteremia with 1 blood culture in September 2013 and at the time had initial PJI and underwent polyexchange and port-a-cath removal.  She completed 6 weeks of antibiotics IV then has been on Keflex.  Her most recent ESR was 32 and CRP 1.3 and she seemed to be doing well.  She then developed pain and swelling and outpatient culture grew MSSA as did operative culture inpatient.  She has been on Ancef again since admission.   -will repeat CRP and ESR for "new" baseline -she will need Ancef  for 6 weeks -Home health antibiotics per protocol, weekly CBC with diff, CMP -we will arrange follow up at [facility] in about 2-3 weeks -has picc line which she will also use for chemotherapy until port-a-cath can be replaced  Staci Righter, MD [facility] [phone] pager   08/23/2012, 5:19 PM

## 2012-08-23 NOTE — Progress Notes (Signed)
Subjective: 1 Day Post-Op Procedure(s) (LRB): REMOVAL PORT-A-CATH (Right) Patient reports pain as 3 on 0-10 scale.   Slow progress with physical therapy.  Has not been getting up independently. PIC line was placed yesterday.  Objective: Vital signs in last 24 hours: Temp:  [97.6 F (36.4 C)-98.8 F (37.1 C)] 97.6 F (36.4 C) (03/06 0707) Pulse Rate:  [76-89] 80 (03/06 0707) Resp:  [9-18] 18 (03/06 0707) BP: (112-135)/(51-74) 130/74 mmHg (03/06 0707) SpO2:  [95 %-99 %] 99 % (03/06 0707)  Intake/Output from previous day: 03/05 0701 - 03/06 0700 In: 1200 [P.O.:600; I.V.:600] Out: 600 [Urine:600] Intake/Output this shift:     Recent Labs  08/21/12 0942  HGB 9.3*    Recent Labs  08/21/12 0942  WBC 7.4  RBC 3.36*  HCT 27.9*  PLT 216  left knee exam:  Neurovascular intact Sensation intact distally Intact pulses distally Dorsiflexion/Plantar flexion intact Incision: dressing C/D/I Compartment soft  Assessment/Plan: 1 Day Post-Op Procedure(s) (LRB): REMOVAL PORT-A-CATH (Right) Status post I&D left knee with removal of total knee components and placement of cement spacer. Plan: Discharge home with home health tomorrow. Will need IV antibiotics per ID. Needs 1 more day of physical therapy.  She is not ambulating independently at this point.  [name],[name] 08/23/2012, 8:55 AM

## 2012-08-23 NOTE — Progress Notes (Signed)
OT Cancellation Note  Patient Details Name: Julie Ross MRN: [id number] DOB: 08-22-1960   Cancelled Treatment:    Reason Eval/Treat Not Completed: Fatigue/lethargy limiting ability to participate;Other (comment) (reports 9/10 pain). Will re attempt tomorrow  Galen Manila, OT 08/23/2012, 2:33 PM

## 2012-08-23 NOTE — Progress Notes (Signed)
Physical Therapy Treatment Patient Details Name: Julie Ross MRN: [id number] DOB: Mar 08, 1961 Today's Date: 08/23/2012 Time: 4098-1191 PT Time Calculation (min): 17 min  PT Assessment / Plan / Recommendation Comments on Treatment Session  Patient able to complete the step today but limited by R knee pain as well today.     Follow Up Recommendations  Home health PT;Supervision/Assistance - 24 hour     Does the patient have the potential to tolerate intense rehabilitation     Barriers to Discharge        Equipment Recommendations  None recommended by PT    Recommendations for Other Services    Frequency Min 6X/week   Plan Discharge plan remains appropriate;Frequency remains appropriate    Precautions / Restrictions Precautions Precautions: Knee Required Braces or Orthoses: Knee Immobilizer - Left;Other Brace/Splint Knee Immobilizer - Left: On at all times Other Brace/Splint: Patient has knee brace from doctors office that she uses on R LE when ambulating Restrictions LLE Weight Bearing: Partial weight bearing   Pertinent Vitals/Pain     Mobility  Transfers Sit to Stand: 4: Min guard;With upper extremity assist;From chair/3-in-1 Stand to Sit: 4: Min guard;To chair/3-in-1;With upper extremity assist Details for Transfer Assistance: verbal cues for correct hand placement  Ambulation/Gait Ambulation/Gait Assistance: 4: Min guard Ambulation Distance (Feet): 6 Feet Assistive device: Rolling walker Ambulation/Gait Assistance Details: Cues for posture and not to lean Gait Pattern: Step-to pattern Stairs: Yes Stairs Assistance: 4: Min assist Stairs Assistance Details (indicate cue type and reason): A for balance. Cues for technique Stair Management Technique: Step to pattern;Backwards;No rails;With walker Number of Stairs: 1    Exercises Total Joint Exercises Ankle Circles/Pumps: AROM;Both;10 reps   PT Diagnosis:    PT Problem List:   PT Treatment Interventions:     PT  Goals Acute Rehab PT Goals PT Goal: Sit to Stand - Progress: Progressing toward goal PT Goal: Stand to Sit - Progress: Progressing toward goal PT Goal: Ambulate - Progress: Progressing toward goal  Visit Information  Last PT Received On: 08/23/12 Assistance Needed: +1    Subjective Data      Cognition  Cognition Overall Cognitive Status: Appears within functional limits for tasks assessed/performed Arousal/Alertness: Awake/alert Orientation Level: Appears intact for tasks assessed Behavior During Session: [facility] for tasks performed    Balance     End of Session PT - End of Session Equipment Utilized During Treatment: Gait belt;Right knee immobilizer Activity Tolerance: Patient tolerated treatment well Patient left: with call bell/phone within reach;in chair;with family/visitor present Nurse Communication: Mobility status   GP     Fredrich Birks 08/23/2012, 12:13 PM 08/23/2012 Fredrich Birks PTA [phone] pager [phone] office

## 2012-08-23 NOTE — Telephone Encounter (Signed)
Patient is still in hospital will call upon release for po f/u

## 2012-08-24 ENCOUNTER — Telehealth: Payer: Self-pay | Admitting: *Deleted

## 2012-08-24 ENCOUNTER — Other Ambulatory Visit: Payer: Self-pay | Admitting: *Deleted

## 2012-08-24 ENCOUNTER — Other Ambulatory Visit: Payer: Self-pay | Admitting: Lab

## 2012-08-24 ENCOUNTER — Ambulatory Visit: Payer: BC Managed Care – PPO | Admitting: Oncology

## 2012-08-24 ENCOUNTER — Ambulatory Visit: Payer: Self-pay

## 2012-08-24 LAB — SEDIMENTATION RATE: Sed Rate: 63 mm/hr — ABNORMAL HIGH (ref 0–22)

## 2012-08-24 LAB — ANAEROBIC CULTURE

## 2012-08-24 LAB — CATH TIP CULTURE: Culture: NO GROWTH

## 2012-08-24 MED ORDER — OXYCODONE-ACETAMINOPHEN 5-325 MG PO TABS: 1.0000 | ORAL_TABLET | Freq: Four times a day (QID) | ORAL | Status: DC | PRN

## 2012-08-24 MED ORDER — CEFAZOLIN SODIUM-DEXTROSE 2-3 GM-% IV SOLR: 2.0000 g | Freq: Three times a day (TID) | INTRAVENOUS | Status: DC

## 2012-08-24 MED ORDER — ASPIRIN 325 MG PO TBEC: 325.0000 mg | DELAYED_RELEASE_TABLET | Freq: Two times a day (BID) | ORAL | Status: DC

## 2012-08-24 MED ORDER — OXYCODONE HCL ER 20 MG PO T12A: 20.0000 mg | EXTENDED_RELEASE_TABLET | Freq: Two times a day (BID) | ORAL | Status: DC

## 2012-08-24 NOTE — Telephone Encounter (Signed)
Per 2nd call from Terri at [facility] per further inquiry pt is due to [place] and her[place] today. Per IV team above cannot be accommodated per personnel and protoccol for treatment to start before 5pm.  Pt is to be d/ced over the weekend with home infusion for IV abx.  This additional information will be given to Dr Welton Flakes.

## 2012-08-24 NOTE — Progress Notes (Signed)
Physical Therapy Treatment Patient Details Name: Julie Ross MRN: [id number] DOB: [birth year] Today's Date: 08/24/2012 Time: 4098-1191 PT Time Calculation (min): 27 min  PT Assessment / Plan / Recommendation Comments on Treatment Session  Patient able to increase ambulation today!!! PLease attempt one step with husband tomorrow    Follow Up Recommendations  Home health PT;Supervision/Assistance - 24 hour     Does the patient have the potential to tolerate intense rehabilitation     Barriers to Discharge        Equipment Recommendations  None recommended by PT    Recommendations for Other Services    Frequency Min 6X/week   Plan Discharge plan remains appropriate;Frequency remains appropriate    Precautions / Restrictions Precautions Precautions: Knee Required Braces or Orthoses: Knee Immobilizer - Left;Other Brace/Splint Knee Immobilizer - Left: On at all times Other Brace/Splint: Patient has knee brace from doctors office that she uses on R LE when ambulating Restrictions Weight Bearing Restrictions: Yes LLE Weight Bearing: Partial weight bearing   Pertinent Vitals/Pain     Mobility  Bed Mobility Bed Mobility: Not assessed Transfers Sit to Stand: 4: Min guard;With upper extremity assist;From chair/3-in-1 Stand to Sit: 4: Min guard;To chair/3-in-1;With upper extremity assist Details for Transfer Assistance: for safety Ambulation/Gait Ambulation/Gait Assistance: 4: Min guard Ambulation Distance (Feet): 60 Feet Assistive device: Rolling walker Ambulation/Gait Assistance Details: Cues for posture Gait Pattern: Step-to pattern Gait velocity: decreased    Exercises     PT Diagnosis:    PT Problem List:   PT Treatment Interventions:     PT Goals Acute Rehab PT Goals PT Goal: Sit to Stand - Progress: Progressing toward goal PT Goal: Stand to Sit - Progress: Progressing toward goal PT Goal: Ambulate - Progress: Progressing toward goal  Visit Information  Last  PT Received On: 08/24/12 Assistance Needed: +1    Subjective Data      Cognition  Cognition Overall Cognitive Status: Appears within functional limits for tasks assessed/performed Arousal/Alertness: Awake/alert Orientation Level: Appears intact for tasks assessed Behavior During Session: [facility] for tasks performed    Balance  Balance Balance Assessed: No  End of Session PT - End of Session Equipment Utilized During Treatment: Gait belt;Right knee immobilizer Activity Tolerance: Patient tolerated treatment well Patient left: with call bell/phone within reach;in chair;with family/visitor present Nurse Communication: Mobility status   GP     Fredrich Birks 08/24/2012, 12:20 PM  08/24/2012 Fredrich Birks PTA [phone] pager [phone] office

## 2012-08-24 NOTE — Progress Notes (Signed)
[facility]  Patient Status: Active (receiving services up to time of hospitalization)  [facility] is providing the following services: RN - IV antibiotics.  Will need a new Rx - either written or in EPIC -  for home IV antibiotics. Thanks!  If patient discharges after hours, please call [phone].   Jodene Nam 08/24/2012, 4:09 PM

## 2012-08-24 NOTE — Telephone Encounter (Signed)
Per review by Dr Welton Flakes order given to proceed with herceptin while inpt.  This RN called the above to Camelia Eng- she will contact the IV team to see if appropriate personnel is available for administration.

## 2012-08-24 NOTE — Progress Notes (Signed)
Subjective: 2 Days Post-Op Procedure(s) (LRB): REMOVAL PORT-A-CATH (Right) And removal of total knee parts with I&D left knee. Patient reports pain as 3 on 0-10 scale.   Progressing with PT.  Objective: Vital signs in last 24 hours: Temp:  [97.5 F (36.4 C)-98.3 F (36.8 C)] 98.1 F (36.7 C) (03/07 1411) Pulse Rate:  [73-89] 73 (03/07 1411) Resp:  [16] 16 (03/07 1411) BP: (148-156)/(66-80) 155/70 mmHg (03/07 1411) SpO2:  [94 %-98 %] 98 % (03/07 1411)  Intake/Output from previous day: 03/06 0701 - 03/07 0700 In: 2975 [P.O.:600; I.V.:2275; IV Piggyback:100] Out: 700 [Urine:700] Intake/Output this shift: Total I/O In: 300 [P.O.:300] Out: -   No results found for this basename: HGB,  in the last 72 hours No results found for this basename: WBC, RBC, HCT, PLT,  in the last 72 hours No results found for this basename: NA, K, CL, CO2, BUN, CREATININE, GLUCOSE, CALCIUM,  in the last 72 hours No results found for this basename: LABPT, INR,  in the last 72 hours Left knee exam: Sensation intact distally Intact pulses distally Dorsiflexion/Plantar flexion intact No cellulitis present Compartment soft  Assessment/Plan: 2 Days Post-Op Procedure(s) (LRB): REMOVAL PORT-A-CATH (Right) And I&D left knee,total knee removal and placement of cement spacer Plan: Plan for discharge tomorrow Will need IV ancef per ID x 6 weeks.  [name],[name] 08/24/2012, 4:02 PM

## 2012-08-24 NOTE — Progress Notes (Signed)
Occupational Therapy Treatment Patient Details Name: Julie Ross MRN: [id number] DOB: [birth year] Today's Date: 08/24/2012 Time: 4782-9562 OT Time Calculation (min): 16 min  OT Assessment / Plan / Recommendation Comments on Treatment Session Pt making progress. Pt should continue with OT services to maximize independence and level of function to return home safely    Follow Up Recommendations  Home health OT;Supervision/Assistance - 24 hour    Barriers to Discharge   none    Equipment Recommendations  None recommended by OT    Recommendations for Other Services    Frequency Min 2X/week   Plan Discharge plan remains appropriate    Precautions / Restrictions Precautions Precautions: Knee Required Braces or Orthoses: Knee Immobilizer - Left;Other Brace/Splint Other Brace/Splint: Patient has knee brace from doctors office that she uses on R LE when ambulating Restrictions Weight Bearing Restrictions: Yes LLE Weight Bearing: Partial weight bearing   Pertinent Vitals/Pain     ADL  Grooming: Performed;Wash/dry hands;Wash/dry face;Min guard Where Assessed - Grooming: Supported standing Lower Body Bathing: Simulated;Moderate assistance Toilet Transfer: Performed;Min Pension scheme manager Method: Sit to Barista: Raised toilet seat with arms (or 3-in-1 over toilet) Toileting - Clothing Manipulation and Hygiene: Performed;Minimal assistance Where Assessed - Glass blower/designer Manipulation and Hygiene: Standing ADL Comments: pt requires increased time to comlpete toileting    OT Diagnosis:    OT Problem List:   OT Treatment Interventions:     OT Goals ADL Goals ADL Goal: Lower Body Bathing - Progress: Progressing toward goals ADL Goal: Toilet Transfer - Progress: Progressing toward goals ADL Goal: Toileting - Clothing Manipulation - Progress: Progressing toward goals ADL Goal: Toileting - Hygiene - Progress: Progressing toward goals  Visit  Information  Last OT Received On: 08/24/12    Subjective Data  Subjective: " I am feeling better " Patient Stated Goal: To return home   Prior Functioning       Cognition  Cognition Overall Cognitive Status: Appears within functional limits for tasks assessed/performed Arousal/Alertness: Awake/alert Orientation Level: Appears intact for tasks assessed Behavior During Session: [facility] for tasks performed    Mobility  Bed Mobility Bed Mobility: Not assessed Transfers Transfers: Sit to Stand;Stand to Sit Sit to Stand: 4: Min guard;With upper extremity assist;From chair/3-in-1 Stand to Sit: 4: Min guard;To chair/3-in-1;With upper extremity assist    Exercises      Balance Balance Balance Assessed: No   End of Session OT - End of Session Equipment Utilized During Treatment: Gait belt;Left knee immobilizer;Other (comment) (3 in 1 over toilet, RW) Activity Tolerance: Patient tolerated treatment well Patient left: in chair;with call bell/phone within reach  GO     Galen Manila 08/24/2012, 11:38 AM

## 2012-08-24 NOTE — Telephone Encounter (Signed)
This RN was contacted by Camelia Eng pharmacist at [facility] wanting to inform Dr Welton Flakes admission status of pt for IV abx for knee infection. Noted Herceptin is scheduled for today. Camelia Eng has not released those orders and is inquiring with Dr Welton Flakes regarding treatment plan " I am not sure she is aware of pt's admission"  Return call number for pharmacy at [facility] is [phone].  Pt is in room 13c on the [employer].  This note will be given to MD.

## 2012-08-25 ENCOUNTER — Ambulatory Visit: Payer: BC Managed Care – PPO

## 2012-08-25 NOTE — Progress Notes (Signed)
Physical Therapy Treatment Patient Details Name: Julie Ross MRN: [id number] DOB: 1960/08/12 Today's Date: 08/25/2012 Time: 4098-1191 PT Time Calculation (min): 10 min  PT Assessment / Plan / Recommendation Comments on Treatment Session  Pt nodding off to sleep throughout exercises today, therfore deferred mobility for safety. Will attempt steps on Monday.    Follow Up Recommendations  Home health PT;Supervision/Assistance - 24 hour           Equipment Recommendations  None recommended by PT       Frequency Min 6X/week   Plan Frequency remains appropriate;Discharge plan remains appropriate    Precautions / Restrictions Precautions Precautions: Knee Required Braces or Orthoses: Knee Immobilizer - Left;Other Brace/Splint (pt has a knee brace for right knee as well) Knee Immobilizer - Left: On at all times Other Brace/Splint: Patient has knee brace from doctors office that she uses on R LE when ambulating Restrictions LLE Weight Bearing: Partial weight bearing       Mobility  Bed Mobility Bed Mobility: Not assessed Transfers Transfers: Not assessed    Exercises Total Joint Exercises Ankle Circles/Pumps: AROM;Both;10 reps;Seated Quad Sets: AROM;Strengthening;Left;10 reps;Seated Hip ABduction/ADduction: AAROM;Strengthening;Left;10 reps;Seated;Limitations Hip Abduction/Adduction Limitations: perfomed with KI on Straight Leg Raises: AAROM;Strengthening;Left;10 reps;Seated;Limitations Straight Leg Raises Limitations: performed with KI on    Visit Information  Last PT Received On: 08/25/12 Assistance Needed: +1    Subjective Data  Subjective: No new complaints from pt (sleeping in chair on and off). Spouse reports pt "very sleepy from her meds".   Cognition  Cognition Overall Cognitive Status: Appears within functional limits for tasks assessed/performed Arousal/Alertness: Awake/alert Orientation Level: Appears intact for tasks assessed Behavior During Session: [facility]  for tasks performed       End of Session PT - End of Session Equipment Utilized During Treatment: Gait belt;Right knee immobilizer Activity Tolerance: Patient limited by fatigue Patient left: in chair;with call bell/phone within reach;with family/visitor present Nurse Communication: Mobility status   GP     Sallyanne Kuster 08/25/2012, 2:38 PM  Sallyanne Kuster, PTA Office- [phone]

## 2012-08-25 NOTE — Progress Notes (Signed)
PATIENT ID: Julie Ross   3 Days Post-Op Procedure(s) (LRB): REMOVAL PORT-A-CATH (Right)  Subjective: Feeling well today. Pain is well controlled. Denies fever, chills, shortness of breath. Progressing well with PT. Ready to go home providing she has power.   Objective:  Filed Vitals:   08/25/12 0655  BP: 126/62  Pulse: 75  Temp: 97.9 F (36.6 C)  Resp: 16     L LE in knee immobilizer Wiggles toes, dorsiflexion, plantarflexion Distally neurovascuarly intact Compartment soft  Labs:  No results found for this basename: HGB,  in the last 72 hoursNo results found for this basename: WBC, RBC, HCT, PLT,  in the last 72 hoursNo results found for this basename: NA, K, CL, CO2, BUN, CREATININE, GLUCOSE, CALCIUM,  in the last 72 hours  Assessment and Plan: 3 Days Post-Op Procedure(s) (LRB):  REMOVAL PORT-A-CATH (Right)  And I&D left knee,total knee removal and placement of cement spacer  Plan:  Plan for discharge home today with home health, may have to wait until her home power comes back on  IV ancef per ID x 6 weeks- script in chart Home pain control, oxycodone and percocet scripts in chart ASA 325mg  BID for home dvt prophylaxis Fu with ACID in 2-3 weeks Fu with Dr. Luiz Blare in 10 days  VTE proph: SCDs, ASA 325mg  BID

## 2012-08-26 DIAGNOSIS — M7989 Other specified soft tissue disorders: Secondary | ICD-10-CM

## 2012-08-26 LAB — CULTURE, BLOOD (ROUTINE X 2)
Culture: NO GROWTH
Culture: NO GROWTH

## 2012-08-26 NOTE — Progress Notes (Signed)
OTCancellation Note   Treatment cancelled today due to patient receiving procedure or test. Will re-attempt later today as time allows.  08/26/2012 Cipriano Mile OTR/L Pager [phone] Office [phone]
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
## 2012-08-26 NOTE — Progress Notes (Signed)
VASCULAR LAB PRELIMINARY  PRELIMINARY  PRELIMINARY  PRELIMINARY  Right lower extremity Doppler completed.    Preliminary report:  There is no DVT or SVT noted in the right lower extremity.  KANADY, CANDACE, RVT 08/26/2012, 11:55 AM

## 2012-08-27 LAB — C-REACTIVE PROTEIN: CRP: 9.2 mg/dL — ABNORMAL HIGH (ref <0.60)

## 2012-08-27 MED ORDER — HEPARIN SOD (PORK) LOCK FLUSH 100 UNIT/ML IV SOLN: 250.0000 [IU] | INTRAVENOUS | Status: DC | PRN

## 2012-08-27 NOTE — Progress Notes (Signed)
Subjective: 5 Days Post-Op Procedure(s) (LRB): REMOVAL PORT-A-CATH (Right) Patient reports pain as 3 on 0-10 scale.    Objective: Vital signs in last 24 hours: Temp:  [98 F (36.7 C)-98.5 F (36.9 C)] 98 F (36.7 C) (03/10 0600) Pulse Rate:  [67-76] 76 (03/10 0600) Resp:  [18-20] 18 (03/10 0600) BP: (149-178)/(59-69) 178/69 mmHg (03/10 0600) SpO2:  [96 %-97 %] 96 % (03/10 0600)  Intake/Output from previous day: 03/09 0701 - 03/10 0700 In: 3811.7 [P.O.:1240; I.V.:2471.7; IV Piggyback:100] Out: -  Intake/Output this shift:     Neurovascular intact Sensation intact distally Intact pulses distally Dorsiflexion/Plantar flexion intact Compartment soft  Assessment/Plan: 5 Days Post-Op Procedure(s) (LRB): REMOVAL PORT-A-CATH (Right) Plan: Discharge home with home health   [name],[name] 08/27/2012, 8:51 AM

## 2012-08-27 NOTE — Progress Notes (Signed)
Patient provided with discharge instructions and follow up information. Educated on the use of her PICC line for IV antibiotics for 6 weeks. She is going home with [facility].

## 2012-08-27 NOTE — Progress Notes (Signed)
08/27/2012 Fredrich Birks PTA [phone] pager [phone] office

## 2012-08-27 NOTE — Progress Notes (Signed)
Occupational Therapy Treatment Patient Details Name: Julie Ross MRN: [id number] DOB: [birth year] Today's Date: 08/27/2012 Time: 1141-1200 OT Time Calculation (min): 19 min  OT Assessment / Plan / Recommendation Comments on Treatment Session Pt making progress. Pt should continue with OT services to maximize independence and level of function to return home safely    Follow Up Recommendations  Home health OT;Supervision/Assistance - 24 hour    Barriers to Discharge       Equipment Recommendations  None recommended by OT    Recommendations for Other Services    Frequency Min 2X/week   Plan Discharge plan remains appropriate    Precautions / Restrictions Precautions Precautions: Knee Required Braces or Orthoses: Knee Immobilizer - Left;Other Brace/Splint Knee Immobilizer - Left: On at all times Other Brace/Splint: Patient has knee brace from doctors office that she uses on R LE when ambulating Restrictions Weight Bearing Restrictions: Yes LLE Weight Bearing: Partial weight bearing   Pertinent Vitals/Pain     ADL  Grooming: Wash/dry face;Wash/dry hands;Teeth care;Brushing hair;Supervision/safety;Set up;Performed Where Assessed - Grooming: Supported standing Lower Body Bathing: Minimal assistance;Simulated Lower Body Dressing: Performed;Moderate assistance Toilet Transfer: Performed;Min Pension scheme manager Method: Sit to Barista: Raised toilet seat with arms (or 3-in-1 over toilet) Toileting - Clothing Manipulation and Hygiene: Performed;Min guard Where Assessed - Glass blower/designer Manipulation and Hygiene: Standing ADL Comments: pt requires increased time to comlpete toileting    OT Diagnosis:    OT Problem List:   OT Treatment Interventions:     OT Goals ADL Goals ADL Goal: Lower Body Bathing - Progress: Progressing toward goals ADL Goal: Lower Body Dressing - Progress: Progressing toward goals ADL Goal: Toilet Transfer - Progress:  Progressing toward goals ADL Goal: Toileting - Clothing Manipulation - Progress: Progressing toward goals ADL Goal: Toileting - Hygiene - Progress: Progressing toward goals  Visit Information  Last OT Received On: 08/27/12 Assistance Needed: +1 PT/OT Co-Evaluation/Treatment: Yes    Subjective Data  Subjective: " I will have help at home " Patient Stated Goal: To return home   Prior Functioning       Cognition  Cognition Overall Cognitive Status: Appears within functional limits for tasks assessed/performed Arousal/Alertness: Awake/alert Orientation Level: Appears intact for tasks assessed Behavior During Session: [facility] for tasks performed    Mobility  Bed Mobility Bed Mobility: Not assessed Supine to Sit: 5: Supervision;With rails Transfers Transfers: Sit to Stand;Stand to Sit Sit to Stand: 4: Min assist;With upper extremity assist;From bed;From chair/3-in-1 Stand to Sit: 4: Min guard;With upper extremity assist;With armrests;To chair/3-in-1 Details for Transfer Assistance: Pt required increased effort for stand, required multiple attempts, (A) for stability.    Exercises      Balance Balance Balance Assessed: No   End of Session OT - End of Session Equipment Utilized During Treatment: Gait belt;Left knee immobilizer;Other (comment) (3 in 1) Activity Tolerance: Patient tolerated treatment well Patient left: in chair;with call bell/phone within reach  GO     Galen Manila 08/27/2012, 2:21 PM

## 2012-08-27 NOTE — Progress Notes (Signed)
Physical Therapy Treatment Patient Details Name: Julie Ross MRN: [id number] DOB: 02-23-1961 Today's Date: 08/27/2012 Time: 1132-1202 PT Time Calculation (min): 30 min  PT Assessment / Plan / Recommendation Comments on Treatment Session  Pt overall in better spirits, still requires increased time for all aspects of mobility.  Anticipate d/c home today, pt has adequate help at home (husband & daughter)    Follow Up Recommendations  Home health PT;Supervision/Assistance - 24 hour     Does the patient have the potential to tolerate intense rehabilitation     Barriers to Discharge        Equipment Recommendations  None recommended by PT    Recommendations for Other Services    Frequency Min 6X/week   Plan Frequency remains appropriate;Discharge plan remains appropriate    Precautions / Restrictions Precautions Precautions: Knee Required Braces or Orthoses: Knee Immobilizer - Left;Other Brace/Splint Knee Immobilizer - Left: On at all times Other Brace/Splint: Patient has knee brace from doctors office that she uses on R LE when ambulating Restrictions Weight Bearing Restrictions: Yes LLE Weight Bearing: Partial weight bearing LLE Partial Weight Bearing Percentage or Pounds: 30   Pertinent Vitals/Pain Pt able to participate in PT with no increased pain.    Mobility  Bed Mobility Bed Mobility: Supine to Sit Supine to Sit: 5: Supervision;With rails Transfers Transfers: Sit to Stand;Stand to Sit Sit to Stand: 4: Min assist;With upper extremity assist;From bed;From chair/3-in-1 Stand to Sit: 4: Min guard;With upper extremity assist;With armrests;To chair/3-in-1 Details for Transfer Assistance: Pt required increased effort for stand, required multiple attempts, (A) for stability. Ambulation/Gait Ambulation/Gait Assistance: 4: Min guard Ambulation Distance (Feet): 20 Feet Assistive device: Rolling walker Ambulation/Gait Assistance Details: Pt required increased time for  ambulation this session. Gait Pattern: Step-through pattern;Decreased step length - right;Decreased step length - left;Trunk flexed Gait velocity: decreased Stairs: Yes Stairs Assistance: 4: Min assist Stairs Assistance Details (indicate cue type and reason): (A) for balance and for RW management.  Cues for technique. Stair Management Technique: No rails;Backwards;With walker Number of Stairs: 1 Wheelchair Mobility Wheelchair Mobility: No    Exercises     PT Diagnosis:    PT Problem List:   PT Treatment Interventions:     PT Goals Acute Rehab PT Goals PT Goal: Supine/Side to Sit - Progress: Progressing toward goal PT Goal: Sit to Stand - Progress: Progressing toward goal PT Goal: Stand to Sit - Progress: Progressing toward goal PT Goal: Up/Down Stairs - Progress: Progressing toward goal  Visit Information  Last PT Received On: 08/27/12 Assistance Needed: +1 PT/OT Co-Evaluation/Treatment: Yes    Subjective Data      Cognition  Cognition Overall Cognitive Status: Appears within functional limits for tasks assessed/performed Arousal/Alertness: Awake/alert Orientation Level: Appears intact for tasks assessed Behavior During Session: [facility] for tasks performed    Balance     End of Session PT - End of Session Equipment Utilized During Treatment: Gait belt;Right knee immobilizer Activity Tolerance: Patient tolerated treatment well Patient left: in chair;with call bell/phone within reach   GP     Enid Baas, SPTA 08/27/2012, 12:14 PM

## 2012-08-29 ENCOUNTER — Telehealth: Payer: Self-pay | Admitting: Radiation Oncology

## 2012-08-29 NOTE — Telephone Encounter (Signed)
Faxed NPE 04/02/12, EOT 06/06/12, FUP 07/19/12 to [facility], [phone].  Received confirmation.  OK per JDK.

## 2012-08-31 ENCOUNTER — Other Ambulatory Visit: Payer: Self-pay | Admitting: Emergency Medicine

## 2012-08-31 ENCOUNTER — Ambulatory Visit: Payer: BC Managed Care – PPO | Admitting: Adult Health

## 2012-08-31 ENCOUNTER — Ambulatory Visit: Payer: BC Managed Care – PPO

## 2012-08-31 ENCOUNTER — Other Ambulatory Visit: Payer: BC Managed Care – PPO | Admitting: Lab

## 2012-08-31 DIAGNOSIS — C50419 Malignant neoplasm of upper-outer quadrant of unspecified female breast: Secondary | ICD-10-CM

## 2012-09-01 ENCOUNTER — Encounter (HOSPITAL_COMMUNITY): Payer: Self-pay | Admitting: Nurse Practitioner

## 2012-09-01 ENCOUNTER — Emergency Department (HOSPITAL_COMMUNITY)
Admission: EM | Admit: 2012-09-01 | Discharge: 2012-09-01 | Disposition: A | Discharge status: Home / Self Care | Payer: BC Managed Care – PPO | Attending: Emergency Medicine | Admitting: Emergency Medicine

## 2012-09-01 ENCOUNTER — Emergency Department (HOSPITAL_COMMUNITY): Payer: BC Managed Care – PPO

## 2012-09-01 DIAGNOSIS — F32A Depression, unspecified: Secondary | ICD-10-CM

## 2012-09-01 DIAGNOSIS — C50919 Malignant neoplasm of unspecified site of unspecified female breast: Secondary | ICD-10-CM | POA: Insufficient documentation

## 2012-09-01 DIAGNOSIS — Z7982 Long term (current) use of aspirin: Secondary | ICD-10-CM | POA: Insufficient documentation

## 2012-09-01 DIAGNOSIS — F329 Major depressive disorder, single episode, unspecified: Secondary | ICD-10-CM | POA: Insufficient documentation

## 2012-09-01 DIAGNOSIS — Z8639 Personal history of other endocrine, nutritional and metabolic disease: Secondary | ICD-10-CM | POA: Insufficient documentation

## 2012-09-01 DIAGNOSIS — Z79899 Other long term (current) drug therapy: Secondary | ICD-10-CM | POA: Insufficient documentation

## 2012-09-01 DIAGNOSIS — Z3202 Encounter for pregnancy test, result negative: Secondary | ICD-10-CM | POA: Insufficient documentation

## 2012-09-01 DIAGNOSIS — Z792 Long term (current) use of antibiotics: Secondary | ICD-10-CM | POA: Insufficient documentation

## 2012-09-01 DIAGNOSIS — Z8739 Personal history of other diseases of the musculoskeletal system and connective tissue: Secondary | ICD-10-CM | POA: Insufficient documentation

## 2012-09-01 DIAGNOSIS — Z96659 Presence of unspecified artificial knee joint: Secondary | ICD-10-CM | POA: Insufficient documentation

## 2012-09-01 DIAGNOSIS — I10 Essential (primary) hypertension: Secondary | ICD-10-CM | POA: Insufficient documentation

## 2012-09-01 DIAGNOSIS — F3289 Other specified depressive episodes: Secondary | ICD-10-CM | POA: Insufficient documentation

## 2012-09-01 DIAGNOSIS — Z862 Personal history of diseases of the blood and blood-forming organs and certain disorders involving the immune mechanism: Secondary | ICD-10-CM | POA: Insufficient documentation

## 2012-09-01 DIAGNOSIS — R45851 Suicidal ideations: Secondary | ICD-10-CM | POA: Insufficient documentation

## 2012-09-01 DIAGNOSIS — G35 Multiple sclerosis: Secondary | ICD-10-CM | POA: Insufficient documentation

## 2012-09-01 DIAGNOSIS — G8929 Other chronic pain: Secondary | ICD-10-CM | POA: Insufficient documentation

## 2012-09-01 LAB — SALICYLATE LEVEL: Salicylate Lvl: <2 mg/dL — ABNORMAL LOW (ref 2.8–20.0)

## 2012-09-01 LAB — COMPREHENSIVE METABOLIC PANEL
ALT: <5 U/L (ref 0–35)
AST: 28 U/L (ref 0–37)
Albumin: 3.1 g/dL — ABNORMAL LOW (ref 3.5–5.2)
Alkaline Phosphatase: 400 U/L — ABNORMAL HIGH (ref 39–117)
BUN: 17 mg/dL (ref 6–23)
CO2: 28 mEq/L (ref 19–32)
Calcium: 9.3 mg/dL (ref 8.4–10.5)
Chloride: 99 mEq/L (ref 96–112)
Creatinine, Ser: 1.03 mg/dL (ref 0.50–1.10)
GFR calc Af Amer: 70 mL/min — ABNORMAL LOW (ref >90)
GFR calc non Af Amer: 60 mL/min — ABNORMAL LOW (ref >90)
Glucose, Bld: 138 mg/dL — ABNORMAL HIGH (ref 70–99)
Potassium: 3.7 mEq/L (ref 3.5–5.1)
Sodium: 138 mEq/L (ref 135–145)
Total Bilirubin: 0.4 mg/dL (ref 0.3–1.2)
Total Protein: 7 g/dL (ref 6.0–8.3)

## 2012-09-01 LAB — URINALYSIS, ROUTINE W REFLEX MICROSCOPIC
Bilirubin Urine: NEGATIVE
Glucose, UA: NEGATIVE mg/dL
Hgb urine dipstick: NEGATIVE
Ketones, ur: NEGATIVE mg/dL
Leukocytes, UA: NEGATIVE
Nitrite: NEGATIVE
Protein, ur: NEGATIVE mg/dL
Specific Gravity, Urine: 1.012 (ref 1.005–1.030)
Urobilinogen, UA: 0.2 mg/dL (ref 0.0–1.0)
pH: 7.5 (ref 5.0–8.0)

## 2012-09-01 LAB — RAPID URINE DRUG SCREEN, HOSP PERFORMED
Amphetamines: NOT DETECTED
Barbiturates: NOT DETECTED
Benzodiazepines: NOT DETECTED
Cocaine: NOT DETECTED
Opiates: NOT DETECTED
Tetrahydrocannabinol: NOT DETECTED

## 2012-09-01 LAB — CBC
HCT: 29 % — ABNORMAL LOW (ref 36.0–46.0)
Hemoglobin: 9.7 g/dL — ABNORMAL LOW (ref 12.0–15.0)
MCH: 28 pg (ref 26.0–34.0)
MCHC: 33.4 g/dL (ref 30.0–36.0)
MCV: 83.8 fL (ref 78.0–100.0)
Platelets: 276 10*3/uL (ref 150–400)
RBC: 3.46 MIL/uL — ABNORMAL LOW (ref 3.87–5.11)
RDW: 16.1 % — ABNORMAL HIGH (ref 11.5–15.5)
WBC: 7.9 10*3/uL (ref 4.0–10.5)

## 2012-09-01 LAB — ETHANOL: Alcohol, Ethyl (B): <11 mg/dL (ref 0–11)

## 2012-09-01 LAB — ACETAMINOPHEN LEVEL: Acetaminophen (Tylenol), Serum: <15 ug/mL (ref 10–30)

## 2012-09-01 LAB — POCT PREGNANCY, URINE: Preg Test, Ur: NEGATIVE

## 2012-09-01 MED ORDER — HYDROMORPHONE HCL PF 1 MG/ML IJ SOLN
1.0000 mg | Freq: Once | INTRAMUSCULAR | Status: AC
  Administered 2012-09-01: 1 mg via INTRAVENOUS
  Filled 2012-09-01: qty 1

## 2012-09-01 MED ORDER — DULOXETINE HCL 30 MG PO CPEP: 90.0000 mg | ORAL_CAPSULE | Freq: Every day | ORAL | Status: DC

## 2012-09-01 NOTE — ED Notes (Signed)
Patient transported to X-ray 

## 2012-09-01 NOTE — ED Notes (Signed)
Returned from  X-ray

## 2012-09-01 NOTE — Discharge Instructions (Signed)
You have signs of possible anxiety and/or depression. This is a very common problem.  °Be sure to call your caregiver and arrange for follow-up care as suggested by our staff. °RETURN IMMEDIATELY IF DEVELOP threat to harm self or others, suicidal or homicidal thoughts, hallucinations or confusion, unable to be cared for at home or uncontrolled behavior, or other concerns.     Behavioral Health Resources in the Community ° °Intensive Outpatient Programs: °[facility]      °[address] °[place], [place] °[phone] °Both a day and evening program °      °[facility]     °[address]        °[place], [place] [address] °[phone]        ° °[facility] °[address] °[place] °336-882-2125 ° °[facility] °ACCESS LINE: 1-800-853-5163 or 336-641-4981 °[address] °[place], [place] [address] °[email or web address]/adult.htm ° °Mobile Crisis Teams:         °                               °[facility]         °Mobile Crisis Care Unit °1-877-626-1772       °      °Assertive °[facility] °[address] [place] °336-834-9664 °                                        °[facility] °Sharon DeEsch °[address] °[place] °336-554-5454 ° °Self-Help/Support Groups: °[facility] of support groups °373-1402 (call for more info) ° °Narcotics Anonymous (NA) °[facility] °[address] °[place] - 2 meetings at this location ° °Residential Treatment Programs:  °[facility]      °[address]        °[place]       °866-801-8205        ° °[facility] °[address] °[place], [place]  [address] °[phone] ° °[facility]  °[address] °[place], [place] [address] °336-845-3988 °Admissions: 8am-3pm M-F ° °[facility]     °[address]        °[place], [place] [address]       °336-841-1104        ° °[facility] °[address] °[place], [place] °336-379-7146 ° °[facility] °[address] °[place], [place] °336-285-9073 ° °[facility]      °[address]     °[place], [place]       °852-3033        ° °[facility]     °[address] °[place], [place] °877-615-2722 or 336-784-9470 ° °[facility]  °[address] °[place],  °336-227-7417 ° °[facility]                                               °[address] °[place]  °[phone] ° °[facility]: °[facility]- 1-888-581-9988              ° °  General Therapy                                                °Julie Brannon, PhD        °[address]                                       °[place], [place] [address]         °336-349-5553   °Insurance ° °[facility]   °[address] °[place], [place] [address] °336-349-4454 ° °[facility] °[address] [place], [place] [address] °336-342-8316 °Insurance/Medicaid/sponsorship through [facility] ° °[facility]                                              °[address]                                        °[place], [place] [address]    °Therapy/tele-psych/case         °336-342-8316        °  °[facility] °[address]  ° [place], [place]  [address]  °Adolescent/group home/case management °336-349-2233  °                                         °Julia Brannon PhD       °General therapy       °Insurance   °[phone]        ° °Dr. Arfeen °Insurance °336- [phone] °M-F ° °[facility] °Medicaid, sponsorship °336-227-7417 ° °

## 2012-09-01 NOTE — ED Notes (Signed)
Pt states understanding of discharge instructions 

## 2012-09-01 NOTE — ED Provider Notes (Signed)
History     CSN: [id number]  Arrival date & time 09/01/12  1345   First MD Initiated Contact with Patient 09/01/12 1515      Chief Complaint  Patient presents with  . Leg Pain  . Suicidal    (Consider location/radiation/quality/duration/timing/severity/associated sxs/prior treatment) HPI This 52 year old female is here for evaluation of her depression. She's never seen a psychiatrist has never had psychiatric counseling. She has chronic severe left knee pain requiring chronic narcotics. She is a history of breast cancer as well as left knee replacement with removal of hardware due to an infection. She has had removal of her Port-A-Cath as well and currently has a PICC line in place after recent hospitalization and is on several weeks of IV antibiotics.   Her cancer chemotherapy is on hold while she is receiving IV antibiotics for several weeks.  She's been depressed for months gradually getting worse and is not suicidal homicidal or hallucinating. She just wants to get rid of her chronic pain. While she was in hospital her chronic pain management was switched within the last few weeks from just short-acting oxycodone to both long and short acting oxycodone. Orthopedics has been managing her chronic pain she does have a specific chronic pain specialist. Her depression is severe and she has no treatment PTA. Past Medical History  Diagnosis Date  . Hypertension   . Hypothyroidism   . Arthritis   . Neuromuscular disorder     MS TX WITH CYMBALTA   . Multiple sclerosis   . H/O colonoscopy   . H/O bone density study 03/2011  . Wears glasses   . Heart burn   . Depression   . Sleep apnea     MILD SLEEP APNEA, NO MACHINE  6 YRS AGO [facility]   . Breast cancer     lul/ER+PR+    Past Surgical History  Procedure Laterality Date  . Tonsillectomy    . Tumor removal      FROM PELVIS AGE 45  . Cesarean section      X2   . No past surgeries      BLADDER SLING  4-5 YRS AGO   . Knee  arthroscopy      LT KNEE 04/2011  . Total knee arthroplasty  08/15/2011    Procedure: TOTAL KNEE ARTHROPLASTY; lft Surgeon: Harvie Junior, MD;  Location: [facility] OR;  Service: Orthopedics;  Laterality: Left;  RIGHT KNEE CORTIZONE INJECTION  . Mohs surgery      right face  . Knee arthroscopy  82    rt  . Joint replacement  Feb 2013  . Mastectomy w/ sentinel node biopsy  12/19/2011    Procedure: MASTECTOMY WITH SENTINEL LYMPH NODE BIOPSY;  Surgeon: Currie Paris, MD;  Location: [facility] OR;  Service: General;  Laterality: Left;  left breast and left axilla   . Portacath placement  01/16/2012    Procedure: INSERTION PORT-A-CATH;  Surgeon: Currie Paris, MD;  Location: [facility];  Service: General;  Laterality: Right;  Porta Cath Placement   . I&d knee with poly exchange  03/02/2012    Procedure: IRRIGATION AND DEBRIDEMENT KNEE WITH POLY EXCHANGE;  Surgeon: Harvie Junior, MD;  Location: [facility] OR;  Service: Orthopedics;  Laterality: Left;  I&D of total knee with Possible Poly Exchange   . Tee without cardioversion  03/06/2012    Procedure: TRANSESOPHAGEAL ECHOCARDIOGRAM (TEE);  Surgeon: Wendall Stade, MD;  Location: [facility] END[facility]COPY;  Service: Cardiovascular;  Laterality:  N/A;  Rm. 2927  . Port-a-cath removal  03/06/2012    Procedure: REMOVAL PORT-A-CATH;  Surgeon: Adolph Pollack, MD;  Location[facility];  Service: General;  Laterality: N/A;  . Portacath placement  05/01/2012    Procedure: INSERTION PORT-A-CATH;  Surgeon: Currie Paris, MD;  Location: [facility];  Service: General;  Laterality: Right;  right internal jugular port-a-cath insertion  . Total knee arthroplasty Left 08/18/2012    Procedure:  Irrigation and debridement of LEFT total knee;  Removal of total knee parts; Implant of spacers;  Surgeon: Nestor Lewandowsky, MD;  Location: [facility] OR;  Service: Orthopedics;  Laterality: Left;  . Port-a-cath removal Right 08/22/2012    Procedure: REMOVAL PORT-A-CATH;  Surgeon: Mariella Saa, MD;  Location: [facility];  Service: General;  Laterality: Right;    Family History  Problem Relation Age of Onset  . Breast cancer Paternal Aunt   . Breast cancer Maternal Grandmother   . Heart disease Maternal Grandfather     History  Substance Use Topics  . Smoking status: Never Smoker   . Smokeless tobacco: Never Used  . Alcohol Use: No    OB History   Grav Para Term Preterm Abortions TAB SAB Ect Mult Living                  Review of Systems 10 Systems reviewed and are negative for acute change except as noted in the HPI. Allergies  Review of patient's allergies indicates no known allergies.  Home Medications   Current Outpatient Rx  Name  Route  Sig  Dispense  Refill  . ALPRAZolam (XANAX) 0.5 MG tablet   Oral   Take 1 tablet (0.5 mg total) by mouth 3 (three) times daily as needed for anxiety.   15 tablet   0   . amLODipine (NORVASC) 10 MG tablet   Oral   Take 1 tablet (10 mg total) by mouth daily.   30 tablet   3   . aspirin EC 325 MG EC tablet   Oral   Take 1 tablet (325 mg total) by mouth 2 (two) times daily.   30 tablet   0   . atenolol (TENORMIN) 25 MG tablet   Oral   Take 0.5 tablets (12.5 mg total) by mouth daily. For blood pressure   15 tablet   0   . baclofen (LIORESAL) 10 MG tablet      10 mg 3 (three) times daily.          Marland Kitchen CALCIUM-MAGNESIUM PO   Oral   Take 1 tablet by mouth daily.         . clonazePAM (KLONOPIN) 0.5 MG tablet   Oral   Take 0.5 mg by mouth at bedtime.          . Diclofenac-Misoprostol 75-0.2 MG TBEC   Oral   Take 75 mg by mouth Twice daily as needed (for inflammation). Take 1 by mouth twice daily as needed         . esomeprazole (NEXIUM) 40 MG capsule   Oral   Take 40 mg by mouth daily before breakfast.         . gabapentin (NEURONTIN) 100 MG capsule   Oral   Take 1 capsule (100 mg total) by mouth 3 (three) times daily. For neuropathy   90 capsule   1   . hydrOXYzine (ATARAX/VISTARIL)  25 MG tablet   Oral   Take 25 mg by mouth  every 8 (eight) hours as needed for itching.          . IRON PO   Oral   Take 45 mg by mouth every other day.          . losartan-hydrochlorothiazide (HYZAAR) 100-25 MG per tablet   Oral   Take 1 tablet by mouth daily.          . Multiple Vitamins-Minerals (MULTIVITAMINS THER. W/MINERALS) TABS   Oral   Take 1 tablet by mouth daily.         . ondansetron (ZOFRAN) 8 MG tablet   Oral   Take 8 mg by mouth every 8 (eight) hours as needed for nausea.          . OxyCODONE (OXYCONTIN) 20 mg T12A   Oral   Take 1 tablet (20 mg total) by mouth every 12 (twelve) hours.   30 tablet   0   . oxyCODONE-acetaminophen (PERCOCET/ROXICET) 5-325 MG per tablet   Oral   Take 1-2 tablets by mouth every 6 (six) hours as needed for pain.   60 tablet   0   . Probiotic Product (PROBIOTIC DAILY PO)   Oral   Take 1 capsule by mouth daily.          . prochlorperazine (COMPAZINE) 10 MG tablet      TAKE 1 TABLET BY MOUTH EVERY 6 HOURS AS NEEDED FOR NAUSEA AND VOMITING   30 tablet   0   . ceFAZolin (ANCEF) 2-3 GM-% SOLR   Intravenous   Inject 50 mLs (2 g total) into the vein every 8 (eight) hours.   126 each   0   . DULoxetine (CYMBALTA) 30 MG capsule   Oral   Take 3 capsules (90 mg total) by mouth daily.   30 capsule   0     BP 159/64  Pulse 77  Temp(Src) 98.1 F (36.7 C) (Oral)  Resp 16  SpO2 96%  Physical Exam  Nursing note and vitals reviewed. Constitutional:  Awake, alert, nontoxic appearance.  HENT:  Head: Atraumatic.  Eyes: Right eye exhibits no discharge. Left eye exhibits no discharge.  Neck: Neck supple.  Cardiovascular: Normal rate and regular rhythm.   No murmur heard. Pulmonary/Chest: Effort normal and breath sounds normal. No respiratory distress. She has no wheezes. She has no rales. She exhibits no tenderness.  Abdominal: Soft. Bowel sounds are normal. There is no tenderness. There is no rebound.   Musculoskeletal: She exhibits tenderness.  Baseline ROM, no obvious new focal weakness. 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  Neurological: She is alert.  Mental status and motor strength appears baseline for patient and situation.  Skin: No rash noted.  Psychiatric:  Depressed no SI/HI/hallucinations    ED Course  Procedures (including critical care time) Telepsych recs discharge and Pt agrees. Labs Reviewed  CBC - Abnormal; Notable for the following:    RBC 3.46 (*)    Hemoglobin 9.7 (*)    HCT 29.0 (*)    RDW 16.1 (*)    All other components within normal limits  COMPREHENSIVE METABOLIC PANEL - Abnormal; Notable for the following:    Glucose, Bld 138 (*)    Albumin 3.1 (*)    Alkaline Phosphatase 400 (*)    GFR calc non Af Amer 60 (*)    GFR calc Af Amer 70 (*)  All other components within normal limits  SALICYLATE LEVEL - Abnormal; Notable for the following:    Salicylate Lvl <2.0 (*)    All other components within normal limits  ACETAMINOPHEN LEVEL  ETHANOL  URINE RAPID DRUG SCREEN (HOSP PERFORMED)  URINALYSIS, ROUTINE W REFLEX MICROSCOPIC  POCT PREGNANCY, URINE   Dg Knee Complete 4 Views Left  09/01/2012  *RADIOLOGY REPORT*  Clinical Data: Knee pain. History of left knee replacement, revision and joint infection.  LEFT KNEE - COMPLETE 4+ VIEW  Comparison: None  Findings: Surgical staples overlying the knee noted. Dense surgical material filling the joint space and along the posterior patella identified. No definite complicating features are noted. Anterior soft tissue swelling is identified.  IMPRESSION: Postoperative changes as described above - no definite acute or complicating features.   Original Report Authenticated By: Harmon Pier, M.D.      1. Depression   2. Chronic pain       MDM  Patient / Family / Caregiver informed of  clinical course, understand medical decision-making process, and agree with plan.  I doubt any other EMC precluding discharge at this time including, but not necessarily limited to the following:SI.        Hurman Horn, MD 09/02/12 [phone]

## 2012-09-01 NOTE — ED Notes (Signed)
Pt reports she would not act on her suicidal thoughts. "I wouldn't actually do it". Denies having a plan. States she is overwhelmed & depressed with all of her health problems & continued left knee pain which is poorly controlled with current meds for past 12months. Stated, "Wouldn't you?" Husband reports wife has never mentioned or talked about SI to him & that behavior hasn't been any different lately.  Presently does not go to any counselling, therapy, etc. Denied ever seeing a psychiatrist in past.

## 2012-09-01 NOTE — ED Notes (Signed)
Determined by Dr. Fonnie Jarvis that pt does not need to be on suicide precautions & stated that the protocol can be discontinued.

## 2012-09-01 NOTE — ED Notes (Signed)
Pt had total knee replacement last year, then was diagnosed with breast ca and caused infection in her surgical site at knee. Pt reports she continues to have severe pain in the knee and her BP has been elevated and she can not get pain under control with meds at home. Family states the pt has been having thoughts of harming herself because "she isnt getting better and this has been going on for a year."

## 2012-09-01 NOTE — ED Notes (Signed)
Telepsych complete

## 2012-09-01 NOTE — ED Notes (Signed)
Report received, assumed care.  

## 2012-09-05 ENCOUNTER — Other Ambulatory Visit: Payer: Self-pay | Admitting: Emergency Medicine

## 2012-09-06 ENCOUNTER — Other Ambulatory Visit: Payer: Self-pay | Admitting: Emergency Medicine

## 2012-09-06 ENCOUNTER — Other Ambulatory Visit: Payer: Self-pay | Admitting: *Deleted

## 2012-09-06 DIAGNOSIS — C50419 Malignant neoplasm of upper-outer quadrant of unspecified female breast: Secondary | ICD-10-CM

## 2012-09-06 MED ORDER — PROCHLORPERAZINE MALEATE 10 MG PO TABS: 10.0000 mg | ORAL_TABLET | Freq: Four times a day (QID) | ORAL | Status: DC | PRN

## 2012-09-07 ENCOUNTER — Telehealth: Payer: Self-pay | Admitting: *Deleted

## 2012-09-07 ENCOUNTER — Other Ambulatory Visit: Payer: Self-pay | Admitting: Emergency Medicine

## 2012-09-07 ENCOUNTER — Other Ambulatory Visit: Payer: BC Managed Care – PPO | Admitting: Lab

## 2012-09-07 ENCOUNTER — Ambulatory Visit: Payer: BC Managed Care – PPO

## 2012-09-07 ENCOUNTER — Ambulatory Visit: Payer: BC Managed Care – PPO | Admitting: Oncology

## 2012-09-07 NOTE — Telephone Encounter (Signed)
Called and spoke with patient to cancel her appt. Due to Dr. Welton Flakes out sick today.  Informed her we would call her back with another appt. She will also need a herceptin appt. For next week as well.

## 2012-09-10 ENCOUNTER — Ambulatory Visit (INDEPENDENT_AMBULATORY_CARE_PROVIDER_SITE_OTHER): Payer: BC Managed Care – PPO | Admitting: Infectious Disease

## 2012-09-10 ENCOUNTER — Encounter: Payer: Self-pay | Admitting: Infectious Disease

## 2012-09-10 VITALS — BP 172/109 | HR 64 | Temp 98.3°F | Wt 190.0 lb

## 2012-09-10 DIAGNOSIS — T8459XD Infection and inflammatory reaction due to other internal joint prosthesis, subsequent encounter: Secondary | ICD-10-CM

## 2012-09-10 DIAGNOSIS — T889XXS Complication of surgical and medical care, unspecified, sequela: Secondary | ICD-10-CM

## 2012-09-10 DIAGNOSIS — T80219S Unspecified infection due to central venous catheter, sequela: Secondary | ICD-10-CM

## 2012-09-10 DIAGNOSIS — Z5189 Encounter for other specified aftercare: Secondary | ICD-10-CM

## 2012-09-10 DIAGNOSIS — A4101 Sepsis due to Methicillin susceptible Staphylococcus aureus: Secondary | ICD-10-CM

## 2012-09-10 DIAGNOSIS — F32A Depression, unspecified: Secondary | ICD-10-CM

## 2012-09-10 DIAGNOSIS — C50919 Malignant neoplasm of unspecified site of unspecified female breast: Secondary | ICD-10-CM

## 2012-09-10 DIAGNOSIS — Z96659 Presence of unspecified artificial knee joint: Secondary | ICD-10-CM

## 2012-09-10 DIAGNOSIS — F3289 Other specified depressive episodes: Secondary | ICD-10-CM

## 2012-09-10 DIAGNOSIS — F329 Major depressive disorder, single episode, unspecified: Secondary | ICD-10-CM

## 2012-09-10 MED ORDER — CEPHALEXIN 500 MG PO CAPS: 1000.0000 mg | ORAL_CAPSULE | Freq: Two times a day (BID) | ORAL | Status: DC

## 2012-09-10 NOTE — Progress Notes (Signed)
Subjective:    Patient ID: Julie Ross, female    DOB: 1960/12/09, 52 y.o.   MRN: [id number]  HPI  52 year old lady with breast cancer with left prosthetic knee with bacteremia in September 2013 and at the time had initial PJI and underwent polyexchange and port-a-cath removal. She completed 6 weeks of antibiotics IV then had been on Keflex. After resumption of chemotherapy she had recurrence of knee swelling and found once again to have PJI with MSSA, now s/p 2 stage revision on March 1st, 2014 . She is now several weeks into a 6 week course of high dose IV ancef. Knee pain is MUCH better than before when simply touching kneee caused severe pain. She has been seen in the ED for depression related to her pain and her condition. I offered referral to [facility] but she declined this and said that she was "fine." I spent greater than 45 minutes with the patient including greater than 50% of time in face to face counsel of the patient and in coordination of their care.     Review of Systems  Constitutional: Negative for fever, chills, diaphoresis, activity change, appetite change, fatigue and unexpected weight change.  HENT: Negative for congestion, sore throat, rhinorrhea, sneezing, trouble swallowing and sinus pressure.   Eyes: Negative for photophobia and visual disturbance.  Respiratory: Negative for cough, chest tightness, shortness of breath, wheezing and stridor.   Cardiovascular: Negative for chest pain, palpitations and leg swelling.  Gastrointestinal: Negative for nausea, vomiting, abdominal pain, diarrhea, constipation, blood in stool, abdominal distention and anal bleeding.  Genitourinary: Negative for dysuria, hematuria, flank pain and difficulty urinating.  Musculoskeletal: Positive for joint swelling and arthralgias. Negative for myalgias, back pain and gait problem.  Skin: Negative for color change, pallor, rash and wound.  Neurological: Negative for dizziness, tremors, weakness and  light-headedness.  Hematological: Negative for adenopathy. Does not bruise/bleed easily.  Psychiatric/Behavioral: Positive for dysphoric mood and decreased concentration. Negative for suicidal ideas, behavioral problems, confusion, sleep disturbance, self-injury and agitation. The patient is nervous/anxious.        Objective:   Physical Exam  Constitutional: She is oriented to person, place, and time. She appears well-developed and well-nourished. No distress.  HENT:  Head: Normocephalic and atraumatic.  Eyes: Conjunctivae and EOM are normal. Pupils are equal, round, and reactive to light. No scleral icterus.  Neck: Normal range of motion. Neck supple. No JVD present.  Cardiovascular: Normal rate and regular rhythm.   Pulmonary/Chest: Effort normal. No respiratory distress.  Abdominal: Soft. She exhibits no distension.  Musculoskeletal: She exhibits no edema and no tenderness.       Legs: Neurological: She is alert and oriented to person, place, and time. She has normal reflexes. She exhibits normal muscle tone. Coordination normal.  Skin: Skin is warm and dry. She is not diaphoretic. No erythema. No pallor.     Psychiatric: She has a normal mood and affect. Her behavior is normal. Judgment and thought content normal.          Assessment & Plan:  Prosthetic jont infection with MSSA:  --continue 6 weeks of high dose IV ancef --I would propose putting her on a suppressive dose of keflex 1g bid, EVEN though prosthesis is out and we SHOULD have effected cure by then given anxiety, concern for relapse with further chemotherapy --I would consider delaying reimplantation until pt has completed chemotherapy but this is a decision taht needs to be made as a team  MSSA bacteremia:  In September and associated with [facility] cath --TEE was negative and pt received 6 weeks of abx --again would use PICC for chemo until we have gotten her completely out of the "woods" as far as her infection as  she has had to have 2 portacaths removed already  Breast Cancer: followed closely by Dr Welton Flakes  Depression: she refused referral to [facility]. I will cc to Dr. Welton Flakes because I am confident here will be resources from Cancer center that can be engaged if pt is willing

## 2012-09-10 NOTE — Patient Instructions (Addendum)
I would like you to followup with Dr. Luciana Axe on April 14th  AFTER you finish the 6 weeks of IV antibiotics we can switch you to keflex 500mg  TWO tablets twice daily

## 2012-09-11 ENCOUNTER — Telehealth: Payer: Self-pay | Admitting: Oncology

## 2012-09-11 NOTE — Telephone Encounter (Signed)
S/w pt re appt for 3/28. Also confirmed appt for echo/bensimhon 3/27 and per pt she is aware.

## 2012-09-12 DIAGNOSIS — T8454XA Infection and inflammatory reaction due to internal left knee prosthesis, initial encounter: Secondary | ICD-10-CM

## 2012-09-12 DIAGNOSIS — M1711 Unilateral primary osteoarthritis, right knee: Secondary | ICD-10-CM
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
## 2012-09-13 ENCOUNTER — Ambulatory Visit (HOSPITAL_COMMUNITY)
Admission: RE | Admit: 2012-09-13 | Discharge: 2012-09-13 | Disposition: A | Discharge status: Home / Self Care | Payer: BC Managed Care – PPO | Source: Ambulatory Visit | Attending: Physician Assistant | Admitting: Physician Assistant

## 2012-09-13 ENCOUNTER — Ambulatory Visit (HOSPITAL_BASED_OUTPATIENT_CLINIC_OR_DEPARTMENT_OTHER)
Admission: RE | Admit: 2012-09-13 | Discharge: 2012-09-13 | Disposition: A | Discharge status: Home / Self Care | Payer: BC Managed Care – PPO | Source: Ambulatory Visit | Attending: Internal Medicine | Admitting: Internal Medicine

## 2012-09-13 VITALS — BP 94/62 | HR 65

## 2012-09-13 DIAGNOSIS — I059 Rheumatic mitral valve disease, unspecified: Secondary | ICD-10-CM

## 2012-09-13 DIAGNOSIS — C50919 Malignant neoplasm of unspecified site of unspecified female breast: Secondary | ICD-10-CM | POA: Insufficient documentation

## 2012-09-13 DIAGNOSIS — C50412 Malignant neoplasm of upper-outer quadrant of left female breast: Secondary | ICD-10-CM

## 2012-09-13 DIAGNOSIS — I10 Essential (primary) hypertension: Secondary | ICD-10-CM | POA: Insufficient documentation

## 2012-09-13 DIAGNOSIS — I080 Rheumatic disorders of both mitral and aortic valves: Secondary | ICD-10-CM | POA: Insufficient documentation

## 2012-09-13 DIAGNOSIS — I079 Rheumatic tricuspid valve disease, unspecified: Secondary | ICD-10-CM | POA: Insufficient documentation

## 2012-09-13 DIAGNOSIS — C50419 Malignant neoplasm of upper-outer quadrant of unspecified female breast: Secondary | ICD-10-CM

## 2012-09-13 NOTE — Assessment & Plan Note (Addendum)
Dr Leory Plowman discussed and reviewed ECHO results. EF and lateral S' stable. No evidence of cardiotoxicity. Follow up in 3 months with an ECHO.   Patient seen and examined with Tonye Becket, NP. We discussed all aspects of the encounter. I agree with the assessment and plan as stated above. I reviewed echos personally. EF and Doppler parameters stable. No HF on exam. Continue Herceptin.

## 2012-09-13 NOTE — Progress Notes (Signed)
  Echocardiogram 2D Echocardiogram has been performed.  Julie Ross A 09/13/2012, 11:09 AM

## 2012-09-13 NOTE — Progress Notes (Signed)
Patient ID: Julie Ross, female   DOB: 05-20-1961, 52 y.o.   MRN: [id number]  Orthopedic: Dr Luiz Blare Oncologist: Dr Welton Flakes ID: Dr Ninetta Lights  HPI: Julie Ross is a 52 year old female with ductal carcinoma in situ of the left breast, HTN, osteoarthritis, and MS (1998)  S/P L mastectomy and porta cath  12/19/11.  Plan to start carboplatnum toxotere/Herceptin to start 01/27/12 for for 12 weeks. HER-2/neu positive. 1 of  4 lymph nodes + for metastatic disease.  Pathology T1 N1 M0. Stage II . 08/15/11 TKR  01/11/12 ECHO EF 60% poor window. 03/02/12 EF 60-65% Lateal S' 13.9 06/11/12 EF 50-55% 09/13/12 EF 55% Lateral S' 13.7  She had 2 treatments of carbo/taxotere and Herceptin. Then was admitted for infection of L TKR. D/c'd 9/27. Now on IV abx x 6 weeks. Chemo was on hold.  08/18/12 S/P removal L Total Knee hardware with spacer in place. MSSA bacteremia  She returns for follow up. Denies SOB/Orthopnea. + PND over the last 2 months. Herceptin on hold in March. PICC for IV antibiotics.      Past Medical History  Diagnosis Date  . Hypertension   . Hypothyroidism   . Arthritis   . Neuromuscular disorder     MS TX WITH CYMBALTA   . Multiple sclerosis   . H/O colonoscopy   . H/O bone density study 03/2011  . Wears glasses   . Heart burn   . Depression   . Sleep apnea     MILD SLEEP APNEA, NO MACHINE  6 YRS AGO HPT [facility]   . Breast cancer     lul/ER+PR+   Current Outpatient Prescriptions on File Prior to Encounter  Medication Sig Dispense Refill  . ALPRAZolam (XANAX) 0.5 MG tablet Take 1 tablet (0.5 mg total) by mouth 3 (three) times daily as needed for anxiety.  15 tablet  0  . amLODipine (NORVASC) 10 MG tablet Take 1 tablet (10 mg total) by mouth daily.  30 tablet  3  . aspirin EC 325 MG EC tablet Take 1 tablet (325 mg total) by mouth 2 (two) times daily.  30 tablet  0  . atenolol (TENORMIN) 25 MG tablet Take 0.5 tablets (12.5 mg total) by mouth daily. For blood pressure  15 tablet  0  . baclofen  (LIORESAL) 10 MG tablet 10 mg 3 (three) times daily.       Marland Kitchen CALCIUM-MAGNESIUM PO Take 1 tablet by mouth daily.      Marland Kitchen ceFAZolin (ANCEF) 2-3 GM-% SOLR Inject 50 mLs (2 g total) into the vein every 8 (eight) hours.  126 each  0  . cephALEXin (KEFLEX) 500 MG capsule Take 2 capsules (1,000 mg total) by mouth 2 (two) times daily.  120 capsule  4  . clonazePAM (KLONOPIN) 0.5 MG tablet Take 0.5 mg by mouth at bedtime.       . Diclofenac-Misoprostol 75-0.2 MG TBEC Take 75 mg by mouth Twice daily as needed (for inflammation). Take 1 by mouth twice daily as needed      . DULoxetine (CYMBALTA) 30 MG capsule Take 3 capsules (90 mg total) by mouth daily.  30 capsule  0  . esomeprazole (NEXIUM) 40 MG capsule Take 40 mg by mouth daily before breakfast.      . gabapentin (NEURONTIN) 100 MG capsule Take 1 capsule (100 mg total) by mouth 3 (three) times daily. For neuropathy  90 capsule  1  . hydrOXYzine (ATARAX/VISTARIL) 25 MG tablet Take 25 mg by mouth every 8 (  eight) hours as needed for itching.       . IRON PO Take 45 mg by mouth every other day.       . losartan-hydrochlorothiazide (HYZAAR) 100-25 MG per tablet Take 1 tablet by mouth daily.       . Multiple Vitamins-Minerals (MULTIVITAMINS THER. W/MINERALS) TABS Take 1 tablet by mouth daily.      . ondansetron (ZOFRAN) 8 MG tablet Take 8 mg by mouth every 8 (eight) hours as needed for nausea.       . OxyCODONE (OXYCONTIN) 20 mg T12A Take 1 tablet (20 mg total) by mouth every 12 (twelve) hours.  30 tablet  0  . oxyCODONE-acetaminophen (PERCOCET/ROXICET) 5-325 MG per tablet Take 1-2 tablets by mouth every 6 (six) hours as needed for pain.  60 tablet  0  . Probiotic Product (PROBIOTIC DAILY PO) Take 1 capsule by mouth daily.       . prochlorperazine (COMPAZINE) 10 MG tablet Take 1 tablet (10 mg total) by mouth every 6 (six) hours as needed.  30 tablet  0   Current Facility-Administered Medications on File Prior to Encounter  Medication Dose Route Frequency  Provider Last Rate Last Dose  . ceFAZolin (ANCEF) IVPB 2 g/50 mL premix    PRN Lance Coon, CRNA   2 g at 08/18/12 1553  . dexamethasone (DECADRON) injection    PRN Lance Coon, CRNA   8 mg at 05/01/12 0908  . fentaNYL (SUBLIMAZE) injection    PRN Lance Coon, CRNA   100 mcg at 05/01/12 0856  . lidocaine (cardiac) 100 mg/25ml (XYLOCAINE) 20 MG/ML injection 2%    PRN Lance Coon, CRNA   60 mg at 05/01/12 0856  . midazolam (VERSED) 5 MG/5ML injection    PRN Lance Coon, CRNA   2 mg at 05/01/12 0856  . propofol (DIPRIVAN) 10 mg/mL bolus/IV push    PRN Lance Coon, CRNA   160 mg at 05/01/12 0900     PHYSICAL EXAM: Filed Vitals:   09/13/12 1106  BP: 94/62  Pulse: 65   General:   No acute distress. No respiratory difficulty (daughter present) in wheelchair HEENT: normal except for alopecia Neck: supple. no JVD. RIJ port.  Carotids 2+ bilat; no bruits. No lymphadenopathy or thryomegaly appreciated. Cor: PMI nondisplaced. Regular rate & rhythm. No rubs, gallops. 2/6 SEM at RSB Lungs: clear Abdomen: soft, nontender, nondistended. No hepatosplenomegaly. No bruits or masses. Good bowel sounds. Extremities: no cyanosis, clubbing, rash, RLE + LLE immobilizer Neuro: alert & oriented x 3, cranial nerves grossly intact. moves all 4 extremities w/o difficulty. Affect pleasant.  ASSESSMENT & PLAN:

## 2012-09-13 NOTE — Patient Instructions (Addendum)
Follow up in 3 months with an ECHO 

## 2012-09-14 ENCOUNTER — Ambulatory Visit (HOSPITAL_BASED_OUTPATIENT_CLINIC_OR_DEPARTMENT_OTHER): Payer: BC Managed Care – PPO | Admitting: Adult Health

## 2012-09-14 ENCOUNTER — Other Ambulatory Visit (HOSPITAL_BASED_OUTPATIENT_CLINIC_OR_DEPARTMENT_OTHER): Payer: BC Managed Care – PPO | Admitting: Lab

## 2012-09-14 ENCOUNTER — Ambulatory Visit (HOSPITAL_BASED_OUTPATIENT_CLINIC_OR_DEPARTMENT_OTHER): Payer: BC Managed Care – PPO

## 2012-09-14 ENCOUNTER — Encounter: Payer: Self-pay | Admitting: Adult Health

## 2012-09-14 ENCOUNTER — Telehealth: Payer: Self-pay | Admitting: Oncology

## 2012-09-14 VITALS — BP 137/70 | HR 61 | Temp 98.4°F | Resp 20

## 2012-09-14 DIAGNOSIS — Z170 Estrogen receptor positive status [ER+]: Secondary | ICD-10-CM

## 2012-09-14 DIAGNOSIS — C50419 Malignant neoplasm of upper-outer quadrant of unspecified female breast: Secondary | ICD-10-CM

## 2012-09-14 DIAGNOSIS — C50412 Malignant neoplasm of upper-outer quadrant of left female breast: Secondary | ICD-10-CM

## 2012-09-14 DIAGNOSIS — Z5112 Encounter for antineoplastic immunotherapy: Secondary | ICD-10-CM

## 2012-09-14 LAB — COMPREHENSIVE METABOLIC PANEL (CC13)
ALT: <6 U/L (ref 0–55)
AST: 21 U/L (ref 5–34)
Albumin: 3.5 g/dL (ref 3.5–5.0)
Alkaline Phosphatase: 310 U/L — ABNORMAL HIGH (ref 40–150)
BUN: 27.6 mg/dL — ABNORMAL HIGH (ref 7.0–26.0)
CO2: 25 mEq/L (ref 22–29)
Calcium: 9.6 mg/dL (ref 8.4–10.4)
Chloride: 102 mEq/L (ref 98–107)
Creatinine: 1.3 mg/dL — ABNORMAL HIGH (ref 0.6–1.1)
Glucose: 132 mg/dl — ABNORMAL HIGH (ref 70–99)
Potassium: 4 mEq/L (ref 3.5–5.1)
Sodium: 139 mEq/L (ref 136–145)
Total Bilirubin: 0.58 mg/dL (ref 0.20–1.20)
Total Protein: 7.2 g/dL (ref 6.4–8.3)

## 2012-09-14 LAB — CBC WITH DIFFERENTIAL/PLATELET
BASO%: 0.9 % (ref 0.0–2.0)
Basophils Absolute: 0.1 10*3/uL (ref 0.0–0.1)
EOS%: 4.1 % (ref 0.0–7.0)
Eosinophils Absolute: 0.3 10*3/uL (ref 0.0–0.5)
HCT: 34.7 % — ABNORMAL LOW (ref 34.8–46.6)
HGB: 11.4 g/dL — ABNORMAL LOW (ref 11.6–15.9)
LYMPH%: 11.9 % — ABNORMAL LOW (ref 14.0–49.7)
MCH: 27.8 pg (ref 25.1–34.0)
MCHC: 32.8 g/dL (ref 31.5–36.0)
MCV: 84.7 fL (ref 79.5–101.0)
MONO#: 0.5 10*3/uL (ref 0.1–0.9)
MONO%: 6.3 % (ref 0.0–14.0)
NEUT#: 6.5 10*3/uL (ref 1.5–6.5)
NEUT%: 76.8 % (ref 38.4–76.8)
Platelets: 228 10*3/uL (ref 145–400)
RBC: 4.1 10*6/uL (ref 3.70–5.45)
RDW: 16.9 % — ABNORMAL HIGH (ref 11.2–14.5)
WBC: 8.4 10*3/uL (ref 3.9–10.3)
lymph#: 1 10*3/uL (ref 0.9–3.3)

## 2012-09-14 LAB — HOLD TUBE, BLOOD BANK

## 2012-09-14 MED ORDER — ACETAMINOPHEN 325 MG PO TABS
650.0000 mg | ORAL_TABLET | Freq: Once | ORAL | Status: AC
  Administered 2012-09-14: 650 mg via ORAL

## 2012-09-14 MED ORDER — HEPARIN SOD (PORK) LOCK FLUSH 100 UNIT/ML IV SOLN
250.0000 [IU] | Freq: Once | INTRAVENOUS | Status: AC | PRN
  Administered 2012-09-14: 250 [IU]
  Filled 2012-09-14: qty 5

## 2012-09-14 MED ORDER — SODIUM CHLORIDE 0.9 % IJ SOLN
10.0000 mL | INTRAMUSCULAR | Status: DC | PRN
  Administered 2012-09-14: 10 mL
  Filled 2012-09-14: qty 10

## 2012-09-14 MED ORDER — DIPHENHYDRAMINE HCL 25 MG PO CAPS
50.0000 mg | ORAL_CAPSULE | Freq: Once | ORAL | Status: AC
  Administered 2012-09-14: 50 mg via ORAL

## 2012-09-14 MED ORDER — SODIUM CHLORIDE 0.9 % IV SOLN
Freq: Once | INTRAVENOUS | Status: AC
  Administered 2012-09-14: 15:00:00 via INTRAVENOUS

## 2012-09-14 MED ORDER — TRASTUZUMAB CHEMO INJECTION 440 MG
2.0000 mg/kg | Freq: Once | INTRAVENOUS | Status: AC
  Administered 2012-09-14: 168 mg via INTRAVENOUS
  Filled 2012-09-14: qty 8

## 2012-09-14 NOTE — Telephone Encounter (Signed)
gv pt appt schedule for April. D/t for f/u per 3/28 pof. Also per pof 4/4 is TCH and 4/11 is herc. Also confirmed w/LA.

## 2012-09-14 NOTE — Patient Instructions (Addendum)
[facility] Discharge Instructions for Patients Receiving Chemotherapy  Today you received the following chemotherapy agents Herceptin.  To help prevent nausea and vomiting after your treatment, we encourage you to take your nausea medication.   If you develop nausea and vomiting that is not controlled by your nausea medication, call the clinic. If it is after clinic hours your family physician or the after hours number for the clinic or go to the Emergency Department.   BELOW ARE SYMPTOMS THAT SHOULD BE REPORTED IMMEDIATELY:  *FEVER GREATER THAN 100.5 F  *CHILLS WITH OR WITHOUT FEVER  NAUSEA AND VOMITING THAT IS NOT CONTROLLED WITH YOUR NAUSEA MEDICATION  *UNUSUAL SHORTNESS OF BREATH  *UNUSUAL BRUISING OR BLEEDING  TENDERNESS IN MOUTH AND THROAT WITH OR WITHOUT PRESENCE OF ULCERS  *URINARY PROBLEMS  *BOWEL PROBLEMS  UNUSUAL RASH Items with * indicate a potential emergency and should be followed up as soon as possible.  One of the nurses will contact you 24 hours after your treatment. Please let the nurse know about any problems that you may have experienced. Feel free to call the clinic you have any questions or concerns. The clinic phone number is [phone].   I have been informed and understand all the instructions given to me. I know to contact the clinic, my physician, or go to the Emergency Department if any problems should occur. I do not have any questions at this time, but understand that I may call the clinic during office hours   should I have any questions or need assistance in obtaining follow up care.    __________________________________________  _____________  __________ Signature of Patient or Authorized Representative            Date                   Time    __________________________________________ Nurse's Signature    

## 2012-09-14 NOTE — Patient Instructions (Addendum)
Proceed with herceptin only today and return next week for [facility].  Please call us if you have any questions or concerns.

## 2012-09-14 NOTE — Progress Notes (Signed)
OFFICE PROGRESS NOTE  CC  Julie Ross [address] [place] [address] Dr. Cyndia Bent Dr. Richardean Chimera Dr. Mardella Layman Dr. Gustavus Messing ([facility])  DIAGNOSIS: 52 year old female who originally was seen in the multidisciplinary breast clinic for discussion of new diagnosis of left breast cancer. She is now status post left mastectomy with the final pathology revealing an invasive ductal carcinoma that is ER positive PR positive HER-2/neu positive with Ki-67 11%.  PRIOR THERAPY:  #1 patient was originally seen in the multidisciplinary breast clinic on 11/30/2011 for what at that time looked like a ductal carcinoma in situ of the left breast. Due to the extent of disease she was recommended a mastectomy with axillary lymph node sampling.  #2 12/19/2011 patient had a left mastectomy with sentinel lymph node biopsy. Her final pathology revealed 2 foci of invasive ductal carcinoma measuring 0.8 and 0.6 cm. The tumor was low grade ER positive PR positive. Patient's tumor was found to be HER-2/neu positive with HER-2 ratio of 3.27. Ki-67 was 11%. Patient had sentinel lymph node  Biopsies performed one of 4 lymph nodes were positive for metastatic disease. Patient's final pathologic staging is T1 N1 M0. Stage II  #3 patient has received adjuvant chemotherapy consisting of Taxotere carboplatinum and Herceptin for 2 cycles. This was then complicated by development of bacteremia left knee sepsis requiring revision of the knee. Port-A-Cath site infection requiring removal of the Port-A-Cath and placement of a PICC line.  #4 patient has been receiving Herceptin every 3 weeks with radiation therapy this is to continue until patient finishes up her antibiotics then we may consider her completing her 2 cycles of Taxotere and carboplatinum combination.  #5 patient has now completed her radiation therapy on 06/04/2012. Overall she tolerated it well.  #6  patient will now begin Taxotere carboplatinum and Herceptin combination to finish out her last 2 cycles. We discussed the risks and benefits of this again. We will plan on starting the regimen on February, 14 2014.patient was begun on [facility] on 08/03/2012. With the hope of finishing of 4 cycles of TCH.  CURRENT THERAPY:  patient is here for Herceptin  INTERVAL HISTORY: Julie Ross 52 y.o. female returns for Followup visit.  She recently had recurrent left knee septic arthritis with MSSA.  She had the hardware in the knee removed, and is on week 5 of antibiotics.  She is followed by ID.  The plan is for her to receive 6 weeks of IV ancef, followed by keflex bid and for the hardware to remain out of the knee until after chemotherapy and the effects of chemotherapy are over.  She is doing well today, and is WBAT on her left leg, however she is minimally puts any weight on it.  She is very tearful today as we discussed everything that has been going on.    MEDICAL HISTORY: Past Medical History  Diagnosis Date  . Hypertension   . Hypothyroidism   . Arthritis   . Neuromuscular disorder     MS TX WITH CYMBALTA   . Multiple sclerosis   . H/O colonoscopy   . H/O bone density study 03/2011  . Wears glasses   . Heart burn   . Depression   . Sleep apnea     MILD SLEEP APNEA, NO MACHINE  6 YRS AGO [facility]   . Breast cancer     lul/ER+PR+    ALLERGIES:  has No Known Allergies.  MEDICATIONS:  Current Outpatient Prescriptions  Medication Sig Dispense Refill  . ALPRAZolam (XANAX) 0.5 MG tablet Take 1 tablet (0.5 mg total) by mouth 3 (three) times daily as needed for anxiety.  15 tablet  0  . amLODipine (NORVASC) 10 MG tablet Take 1 tablet (10 mg total) by mouth daily.  30 tablet  3  . aspirin EC 325 MG EC tablet Take 1 tablet (325 mg total) by mouth 2 (two) times daily.  30 tablet  0  . atenolol (TENORMIN) 25 MG tablet Take 0.5 tablets (12.5 mg total) by mouth daily. For blood pressure  15  tablet  0  . baclofen (LIORESAL) 10 MG tablet 10 mg 3 (three) times daily.       Marland Kitchen CALCIUM-MAGNESIUM PO Take 1 tablet by mouth daily.      Marland Kitchen ceFAZolin (ANCEF) 2-3 GM-% SOLR Inject 50 mLs (2 g total) into the vein every 8 (eight) hours.  126 each  0  . Diclofenac-Misoprostol 75-0.2 MG TBEC Take 75 mg by mouth Twice daily as needed (for inflammation). Take 1 by mouth twice daily as needed      . DULoxetine (CYMBALTA) 30 MG capsule Take 3 capsules (90 mg total) by mouth daily.  30 capsule  0  . esomeprazole (NEXIUM) 40 MG capsule Take 40 mg by mouth daily before breakfast.      . hydrOXYzine (ATARAX/VISTARIL) 25 MG tablet Take 25 mg by mouth every 8 (eight) hours as needed for itching.       . IRON PO Take 45 mg by mouth every other day.       . losartan-hydrochlorothiazide (HYZAAR) 100-25 MG per tablet Take 1 tablet by mouth daily.       . Multiple Vitamins-Minerals (MULTIVITAMINS THER. W/MINERALS) TABS Take 1 tablet by mouth daily.      . ondansetron (ZOFRAN) 8 MG tablet Take 8 mg by mouth every 8 (eight) hours as needed for nausea.       . OxyCODONE (OXYCONTIN) 20 mg T12A Take 1 tablet (20 mg total) by mouth every 12 (twelve) hours.  30 tablet  0  . oxyCODONE-acetaminophen (PERCOCET/ROXICET) 5-325 MG per tablet Take 1-2 tablets by mouth every 6 (six) hours as needed for pain.  60 tablet  0  . Probiotic Product (PROBIOTIC DAILY PO) Take 1 capsule by mouth daily.       . prochlorperazine (COMPAZINE) 10 MG tablet Take 1 tablet (10 mg total) by mouth every 6 (six) hours as needed.  30 tablet  0  . cephALEXin (KEFLEX) 500 MG capsule Take 2 capsules (1,000 mg total) by mouth 2 (two) times daily.  120 capsule  4  . clonazePAM (KLONOPIN) 0.5 MG tablet Take 0.5 mg by mouth at bedtime.       . gabapentin (NEURONTIN) 100 MG capsule Take 1 capsule (100 mg total) by mouth 3 (three) times daily. For neuropathy  90 capsule  1   No current facility-administered medications for this visit.    Facility-Administered Medications Ordered in Other Visits  Medication Dose Route Frequency Provider Last Rate Last Dose  . ceFAZolin (ANCEF) IVPB 2 g/50 mL premix    PRN Julie Coon, CRNA   2 g at 08/18/12 1553  . dexamethasone (DECADRON) injection    PRN Julie Coon, CRNA   8 mg at 05/01/12 0908  . fentaNYL (SUBLIMAZE) injection    PRN Julie Coon, CRNA   100 mcg at 05/01/12 0856  . lidocaine (cardiac) 100 mg/14ml (XYLOCAINE) 20 MG/ML  injection 2%    PRN Julie Coon, CRNA   60 mg at 05/01/12 0856  . midazolam (VERSED) 5 MG/5ML injection    PRN Julie Coon, CRNA   2 mg at 05/01/12 0856  . propofol (DIPRIVAN) 10 mg/mL bolus/IV push    PRN Julie Coon, CRNA   160 mg at 05/01/12 0900    SURGICAL HISTORY:  Past Surgical History  Procedure Laterality Date  . Tonsillectomy    . Tumor removal      FROM PELVIS AGE 44  . Cesarean section      X2   . No past surgeries      BLADDER SLING  4-5 YRS AGO   . Knee arthroscopy      LT KNEE 04/2011  . Total knee arthroplasty  08/15/2011    Procedure: TOTAL KNEE ARTHROPLASTY; lft Surgeon: Harvie Junior, MD;  Location: [facility] OR;  Service: Orthopedics;  Laterality: Left;  RIGHT KNEE CORTIZONE INJECTION  . Mohs surgery      right face  . Knee arthroscopy  82    rt  . Joint replacement  Feb 2013  . Mastectomy w/ sentinel node biopsy  12/19/2011    Procedure: MASTECTOMY WITH SENTINEL LYMPH NODE BIOPSY;  Surgeon: Currie Paris, MD;  Location: [facility] OR;  Service: General;  Laterality: Left;  left breast and left axilla   . Portacath placement  01/16/2012    Procedure: INSERTION PORT-A-CATH;  Surgeon: Currie Paris, MD;  Location: [facility];  Service: General;  Laterality: Right;  Porta Cath Placement   . I&d knee with poly exchange  03/02/2012    Procedure: IRRIGATION AND DEBRIDEMENT KNEE WITH POLY EXCHANGE;  Surgeon: Harvie Junior, MD;  Location: [facility] OR;  Service: Orthopedics;  Laterality: Left;  I&D of total knee with  Possible Poly Exchange   . Tee without cardioversion  03/06/2012    Procedure: TRANSESOPHAGEAL ECHOCARDIOGRAM (TEE);  Surgeon: Wendall Stade, MD;  Location: [facility]COPY;  Service: Cardiovascular;  Laterality: N/A;  Rm. [id number]  . Port-a-cath removal  03/06/2012    Procedure: REMOVAL PORT-A-CATH;  Surgeon: Adolph Pollack, MD;  Location: [facility] OR;  Service: General;  Laterality: N/A;  . Portacath placement  05/01/2012    Procedure: INSERTION PORT-A-CATH;  Surgeon: Currie Paris, MD;  Location: [facility];  Service: General;  Laterality: Right;  right internal jugular port-a-cath insertion  . Total knee arthroplasty Left 08/18/2012    Procedure:  Irrigation and debridement of LEFT total knee;  Removal of total knee parts; Implant of spacers;  Surgeon: Nestor Lewandowsky, MD;  Location: [facility] OR;  Service: Orthopedics;  Laterality: Left;  . Port-a-cath removal Right 08/22/2012    Procedure: REMOVAL PORT-A-CATH;  Surgeon: Mariella Saa, MD;  Location: [facility] OR;  Service: General;  Laterality: Right;    REVIEW OF SYSTEMS:   General: fatigue (+), night sweats (-), fever (-), pain (-) Lymph: palpable nodes (-) HEENT: vision changes (-), mucositis (-), gum bleeding (-), epistaxis (-) Cardiovascular: chest pain (-), palpitations (-) Pulmonary: shortness of breath (-), dyspnea on exertion (-), cough (-), hemoptysis (-) GI:  Early satiety (-), melena (-), dysphagia (-), nausea/vomiting (-), diarrhea (-) GU: dysuria (-), hematuria (-), incontinence (-) Musculoskeletal: joint swelling (-), joint pain (-), back pain (-) Neuro: weakness (-), numbness (-), headache (-), confusion (-) Skin: Rash (-), lesions (-), dryness (-) Psych: depression (-), suicidal/homicidal ideation (-), feeling of hopelessness (-)   PHYSICAL EXAMINATION:  BP  137/70  Pulse 61  Temp(Src) 98.4 F (36.9 C) (Oral)  Resp 20 General: Patient is a well appearing female in no acute distress HEENT: PERRLA, sclerae anicteric  no conjunctival pallor, MMM Neck: supple, no palpable adenopathy Lungs: clear to auscultation bilaterally, no wheezes, rhonchi, or rales Cardiovascular: regular rate rhythm, S1, S2, no murmurs, rubs or gallops Abdomen: Soft, non-tender, non-distended, normoactive bowel sounds, no HSM Extremities: warm and well perfused, no clubbing, cyanosis, or edema Skin: No rashes or lesions Neuro: Non-focal Breasts: left chest wall mastectomy site no nodularity, erythematous from radiation.   ECOG PERFORMANCE STATUS: 1 - Symptomatic but completely ambulatory  LABORATORY DATA: Lab Results  Component Value Date   WBC 8.4 09/14/2012   HGB 11.4* 09/14/2012   HCT 34.7* 09/14/2012   MCV 84.7 09/14/2012   PLT 228 09/14/2012      Chemistry      Component Value Date/Time   NA 138 09/01/2012 1420   NA 137 08/17/2012 1023   K 3.7 09/01/2012 1420   K 3.6 08/17/2012 1023   CL 99 09/01/2012 1420   CL 100 08/17/2012 1023   CO2 28 09/01/2012 1420   CO2 25 08/17/2012 1023   BUN 17 09/01/2012 1420   BUN 17.6 08/17/2012 1023   CREATININE 1.03 09/01/2012 1420   CREATININE 1.3* 08/17/2012 1023      Component Value Date/Time   CALCIUM 9.3 09/01/2012 1420   CALCIUM 9.3 08/17/2012 1023   ALKPHOS 400* 09/01/2012 1420   ALKPHOS 632* 08/17/2012 1023   AST 28 09/01/2012 1420   AST 35* 08/17/2012 1023   ALT <5 09/01/2012 1420   ALT 91* 08/17/2012 1023   BILITOT 0.4 09/01/2012 1420   BILITOT 0.30 08/17/2012 1023     DOB: 1961/06/19 Age: 66 Gender: F Client Name [place] Collected Date: 12/19/2011 Received Date: 12/19/2011 Physician: Cyndia Bent Chart #: MRN # : [id number] Physician cc: Richardean Chimera, MD Sherian Rein, MD Race:W Visit #: [id number] Kaylyn Lim, RN REPORT OF SURGICAL PATHOLOGY ADDITIONAL INFORMATION: 5. PROGNOSTIC INDICATORS - ACIS Results IMMUNOHISTOCHEMICAL AND MORPHOMETRIC ANALYSIS BY THE AUTOMATED CELLULAR IMAGING SYSTEM (ACIS) Estrogen Receptor (Negative, <1%): 100%, STRONG  STAINING INTENSITY Progesterone Receptor (Negative, <1%): 100%, STRONG STAINING INTENSITY Proliferation Marker Ki67 by M IB-1 (Low<20%): 11% All controls stained appropriately Pecola Leisure MD Pathologist, Electronic Signature ( Signed 12/29/2011) 5. CHROMOGENIC IN-SITU HYBRIDIZATION Interpretation: HER2/NEU BY CISH - SHOWS AMPLIFICATION BY CISH ANALYSIS. THE RATIO OF HER2: CEP 17 SIGNALS WAS 3.27 Reference range: Ratio: HER2:CEP17 < 1.8 gene amplification not observed Ratio: HER2:CEP 17 1.8-2.2 - equivocal result Ratio: HER2:CEP17 > 2.2 - gene amplification observed Pecola Leisure MD Pathologist, Electronic Signature ( Signed 12/27/2011) 1 of 4 FINAL for Skousen, Katrice M ([id number]) FINAL DIAGNOSIS Diagnosis 1. Lymph node, sentinel, biopsy, Left axillary - ONE BENIGN LYMPH NODE WITH NO TUMOR SEEN (0/1). 2. Lymph node, sentinel, biopsy, Left axilla - ONE LYMPH NODE POSITIVE FOR METASTATIC DUCTAL CARCINOMA (1/1). - SEE COMMENT. 3. Lymph node, sentinel, biopsy, Left axilla - ONE BENIGN LYMPH NODE WITH NO TUMOR SEEN (0/1). 4. Lymph node, sentinel, biopsy, Left axilla - ONE BENIGN LYMPH NODE WITH NO TUMOR SEEN (0/1). 5. Breast, simple mastectomy, Left - INVASIVE GRADE I DUCTAL CARCINOMA, TWO FOCI MEASURING 0.8 CM AND 0.6 CM ARISING IN A BACKGROUND OF EXTENSIVE INTERMEDIATE GRADE DUCTAL CARCINOMA IN SITU WITH NECROSIS. - MARGINS ARE NEGATIVE. - SEE ONCOLOGY TEMPLATE. Microscopic Comment 2. On re-review of the frozen section slides, no metastatic carcinoma is identified.  The carcinoma present is on permanent sections only. 5. BREAST, INVASIVE TUMOR, WITH LYMPH NODE SAMPLING Specimen, including laterality: Left breast with sentinel lymph node. Procedure: Left simple mastectomy with sentinel lymph node biopsy. Grade: I (Both foci of tumors show similar morphology). Tubule formation: 2. Nuclear pleomorphism: 2. Mitotic: 1. Tumor size (glass slide measurement): Two foci measuring 0.8  cm and 0.6 cm. Margins: Invasive, distance to closest margin: At least 1.5 cm. In-situ, distance to closest margin: At least 1.5 cm. Lymphovascular invasion: No definite lymph/vascular invasion is not identified however a sentinel lymph node is positive (see below). Ductal carcinoma in situ: Yes. Grade: Intermediate grade. Extensive intraductal component: Yes. Lobular neoplasia: Not identified. Tumor focality: Two foci. Treatment effect: Not applicable. Extent of tumor: Tumor confined to breast parenchyma. Lymph nodes: # examined: 4. Lymph nodes with metastasis: 1. Macrometastasis: (> 2.0 mm): 1. Extracapsular extension: Not identified. Breast prognostic profile: A breast prognostic profile will be performed (see below comment). Additional breast findings: Fibrocystic changes with usual ductal hyperplasia. TNM: mpT1b, pN1a, MX. Comment: The palpable nodular area seen grossly is comprised predominantly of intermediate grade ductal carcinoma in situ with only two foci of invasive ductal carcinoma. Both invasive foci are morphologically similar. As such a breast prognostic profile will only be performed on the larger tumor. A breast prognostic profile can be performed upon the smaller tumor per request. (RH:kh 12-21-11) 2 of 4   ASSESSMENT: 52 year old female with  #1 stage II invasive ductal carcinoma of the left breast with ductal carcinoma in situ. Patient is status post mastectomy that showed 2 foci of invasive cancer measuring 0.8 0.6 cm. The tumor is ER positive PR positive HER-2/neu amplified at 3.27. Patient is now seen in medical oncology for discussion of adjuvant treatment. As mentioned above her case was discussed at the multidisciplinary breast conference. Patient is noted to have one of 4 lymph nodes positive for metastatic disease. This was only axillary sentinel node biopsy. A discussion of whether patient under should undergo a full axillary lymph node dissection did take  place but at the end of our extensive discussion it was recommended the patient will proceed with radiation and DOS she would feel the criteria of seed 11. Therefore is excellent lymph node dissection is not being done. Instead the patient will proceed with adjuvant chemotherapy and Herceptin. Her chemotherapy will consist of Taxotere carboplatinum given every 21 days. For the duration of her chemotherapy patient will get Herceptin on a weekly basis. Once she completes the chemotherapy she then will get Herceptin every 3 weeks. After completion of chemotherapy patient will proceed with radiation therapy with concurrent Herceptin. After completion of radiation she will start Herceptin and antiestrogen therapy consisting of either tamoxifen or an aromatase inhibitor.  #2 patient has now completed radiation therapy and Herceptin combination. Her radiation completed on 06/04/2012.  #3 we will reinitiate combination chemotherapy and Herceptin on 08/03/2012. The chemotherapy will consist of Taxotere or and carboplatinum with Herceptin. The Taxotere carboplatinum will be given every 21 days to complete out 2 more cycles. Herceptin will be given on a weekly basis.  PLAN:  #1 Ms. Pasko is doing well.  She will proceed with herceptin today.  We will give her TCH next week.  This will be her final cycle.  I offered support from the social worker and chaplain, but she declined.   #2 Patient will continue to see her orthopedic surgeon, Dr. Luiz Blare for her right knee.  #3 she will return in 1  week for [facility].    All questions were answered. The patient knows to call the clinic with any problems, questions or concerns. We can certainly see the patient much sooner if necessary.  I spent 25 minutes counseling the patient face to face. The total time spent in the appointment was 30 minutes.  Cherie Ouch Lyn Hollingshead, NP Medical Oncology [facility] Phone: [phone]    09/14/2012, 2:10 PM

## 2012-09-20 ENCOUNTER — Other Ambulatory Visit: Payer: Self-pay | Admitting: Oncology

## 2012-09-21 ENCOUNTER — Ambulatory Visit (HOSPITAL_BASED_OUTPATIENT_CLINIC_OR_DEPARTMENT_OTHER): Payer: 59 | Admitting: Adult Health

## 2012-09-21 ENCOUNTER — Encounter: Payer: Self-pay | Admitting: Adult Health

## 2012-09-21 ENCOUNTER — Other Ambulatory Visit (HOSPITAL_BASED_OUTPATIENT_CLINIC_OR_DEPARTMENT_OTHER): Payer: 59

## 2012-09-21 ENCOUNTER — Ambulatory Visit (HOSPITAL_BASED_OUTPATIENT_CLINIC_OR_DEPARTMENT_OTHER): Payer: 59

## 2012-09-21 VITALS — BP 156/94 | HR 61 | Temp 98.3°F | Resp 20 | Ht 66.0 in | Wt 194.2 lb

## 2012-09-21 DIAGNOSIS — Z170 Estrogen receptor positive status [ER+]: Secondary | ICD-10-CM

## 2012-09-21 DIAGNOSIS — C773 Secondary and unspecified malignant neoplasm of axilla and upper limb lymph nodes: Secondary | ICD-10-CM

## 2012-09-21 DIAGNOSIS — C50412 Malignant neoplasm of upper-outer quadrant of left female breast: Secondary | ICD-10-CM

## 2012-09-21 DIAGNOSIS — Z5112 Encounter for antineoplastic immunotherapy: Secondary | ICD-10-CM

## 2012-09-21 DIAGNOSIS — E860 Dehydration: Secondary | ICD-10-CM

## 2012-09-21 DIAGNOSIS — C50419 Malignant neoplasm of upper-outer quadrant of unspecified female breast: Secondary | ICD-10-CM

## 2012-09-21 DIAGNOSIS — Z5111 Encounter for antineoplastic chemotherapy: Secondary | ICD-10-CM

## 2012-09-21 LAB — CBC WITH DIFFERENTIAL/PLATELET
BASO%: 0 % (ref 0.0–2.0)
Basophils Absolute: 0 10*3/uL (ref 0.0–0.1)
EOS%: 0 % (ref 0.0–7.0)
Eosinophils Absolute: 0 10*3/uL (ref 0.0–0.5)
HCT: 30.8 % — ABNORMAL LOW (ref 34.8–46.6)
HGB: 10.3 g/dL — ABNORMAL LOW (ref 11.6–15.9)
LYMPH%: 7.1 % — ABNORMAL LOW (ref 14.0–49.7)
MCH: 27.9 pg (ref 25.1–34.0)
MCHC: 33.4 g/dL (ref 31.5–36.0)
MCV: 83.5 fL (ref 79.5–101.0)
MONO#: 0.3 10*3/uL (ref 0.1–0.9)
MONO%: 2.9 % (ref 0.0–14.0)
NEUT#: 7.9 10*3/uL — ABNORMAL HIGH (ref 1.5–6.5)
NEUT%: 90 % — ABNORMAL HIGH (ref 38.4–76.8)
Platelets: 230 10*3/uL (ref 145–400)
RBC: 3.69 10*6/uL — ABNORMAL LOW (ref 3.70–5.45)
RDW: 14.9 % — ABNORMAL HIGH (ref 11.2–14.5)
WBC: 8.8 10*3/uL (ref 3.9–10.3)
lymph#: 0.6 10*3/uL — ABNORMAL LOW (ref 0.9–3.3)
nRBC: 0 % (ref 0–0)

## 2012-09-21 LAB — COMPREHENSIVE METABOLIC PANEL (CC13)
ALT: <6 U/L (ref 0–55)
AST: 22 U/L (ref 5–34)
Albumin: 3.4 g/dL — ABNORMAL LOW (ref 3.5–5.0)
Alkaline Phosphatase: 261 U/L — ABNORMAL HIGH (ref 40–150)
BUN: 28.4 mg/dL — ABNORMAL HIGH (ref 7.0–26.0)
CO2: 25 mEq/L (ref 22–29)
Calcium: 9.3 mg/dL (ref 8.4–10.4)
Chloride: 98 mEq/L (ref 98–107)
Creatinine: 1.4 mg/dL — ABNORMAL HIGH (ref 0.6–1.1)
Glucose: 148 mg/dl — ABNORMAL HIGH (ref 70–99)
Potassium: 4.1 mEq/L (ref 3.5–5.1)
Sodium: 134 mEq/L — ABNORMAL LOW (ref 136–145)
Total Bilirubin: 0.34 mg/dL (ref 0.20–1.20)
Total Protein: 7 g/dL (ref 6.4–8.3)

## 2012-09-21 MED ORDER — DIPHENHYDRAMINE HCL 25 MG PO CAPS
50.0000 mg | ORAL_CAPSULE | Freq: Once | ORAL | Status: AC
  Administered 2012-09-21: 50 mg via ORAL

## 2012-09-21 MED ORDER — ONDANSETRON 16 MG/50ML IVPB (CHCC)
16.0000 mg | Freq: Once | INTRAVENOUS | Status: AC
  Administered 2012-09-21: 16 mg via INTRAVENOUS

## 2012-09-21 MED ORDER — SODIUM CHLORIDE 0.9 % IV SOLN
Freq: Once | INTRAVENOUS | Status: AC
  Administered 2012-09-21: 13:00:00 via INTRAVENOUS

## 2012-09-21 MED ORDER — DEXAMETHASONE SODIUM PHOSPHATE 4 MG/ML IJ SOLN
20.0000 mg | Freq: Once | INTRAMUSCULAR | Status: AC
  Administered 2012-09-21: 20 mg via INTRAVENOUS

## 2012-09-21 MED ORDER — SODIUM CHLORIDE 0.9 % IV SOLN
560.0000 mg | Freq: Once | INTRAVENOUS | Status: AC
  Administered 2012-09-21: 560 mg via INTRAVENOUS
  Filled 2012-09-21: qty 56

## 2012-09-21 MED ORDER — SODIUM CHLORIDE 0.9 % IV SOLN
Freq: Once | INTRAVENOUS | Status: AC
  Administered 2012-09-21: 11:00:00 via INTRAVENOUS

## 2012-09-21 MED ORDER — SODIUM CHLORIDE 0.9 % IV SOLN: Freq: Once | INTRAVENOUS | Status: AC

## 2012-09-21 MED ORDER — TRASTUZUMAB CHEMO INJECTION 440 MG
2.0000 mg/kg | Freq: Once | INTRAVENOUS | Status: AC
  Administered 2012-09-21: 168 mg via INTRAVENOUS
  Filled 2012-09-21: qty 8

## 2012-09-21 MED ORDER — SODIUM CHLORIDE 0.9 % IJ SOLN
10.0000 mL | INTRAMUSCULAR | Status: DC | PRN
  Administered 2012-09-21: 10 mL
  Filled 2012-09-21: qty 10

## 2012-09-21 MED ORDER — ACETAMINOPHEN 325 MG PO TABS
650.0000 mg | ORAL_TABLET | Freq: Once | ORAL | Status: AC
  Administered 2012-09-21: 650 mg via ORAL

## 2012-09-21 MED ORDER — SODIUM CHLORIDE 0.9 % IV SOLN
75.0000 mg/m2 | Freq: Once | INTRAVENOUS | Status: AC
  Administered 2012-09-21: 150 mg via INTRAVENOUS
  Filled 2012-09-21: qty 15

## 2012-09-21 MED ORDER — HEPARIN SOD (PORK) LOCK FLUSH 100 UNIT/ML IV SOLN
250.0000 [IU] | Freq: Once | INTRAVENOUS | Status: AC | PRN
  Administered 2012-09-21: 250 [IU]
  Filled 2012-09-21: qty 5

## 2012-09-21 NOTE — Addendum Note (Signed)
Addended by: Augustin Schooling C on: 09/21/2012 12:28 PM   Modules accepted: Orders

## 2012-09-21 NOTE — Patient Instructions (Signed)
[facility] Discharge Instructions for Patients Receiving Chemotherapy  Today you received the following chemotherapy agents Taxotere/Carboplatin/herceptin  To help prevent nausea and vomiting after your treatment, we encourage you to take your nausea medication as prescribed.  If you develop nausea and vomiting that is not controlled by your nausea medication, call the clinic. If it is after clinic hours your family physician or the after hours number for the clinic or go to the Emergency Department.   BELOW ARE SYMPTOMS THAT SHOULD BE REPORTED IMMEDIATELY:  *FEVER GREATER THAN 100.5 F  *CHILLS WITH OR WITHOUT FEVER  NAUSEA AND VOMITING THAT IS NOT CONTROLLED WITH YOUR NAUSEA MEDICATION  *UNUSUAL SHORTNESS OF BREATH  *UNUSUAL BRUISING OR BLEEDING  TENDERNESS IN MOUTH AND THROAT WITH OR WITHOUT PRESENCE OF ULCERS  *URINARY PROBLEMS  *BOWEL PROBLEMS  UNUSUAL RASH Items with * indicate a potential emergency and should be followed up as soon as possible.  One of the nurses will contact you 24 hours after your treatment. Please let the nurse know about any problems that you may have experienced. Feel free to call the clinic you have any questions or concerns. The clinic phone number is [phone].   I have been informed and understand all the instructions given to me. I know to contact the clinic, my physician, or go to the Emergency Department if any problems should occur. I do not have any questions at this time, but understand that I may call the clinic during office hours   should I have any questions or need assistance in obtaining follow up care.    __________________________________________  _____________  __________ Signature of Patient or Authorized Representative            Date                   Time    __________________________________________ Nurse's Signature

## 2012-09-21 NOTE — Progress Notes (Signed)
OFFICE PROGRESS NOTE  CC  Julie Ross [address] [place] [address] Dr. Cyndia Bent Dr. Richardean Chimera Dr. Mardella Layman Dr. Gustavus Messing ([facility])  DIAGNOSIS: 52 year old female who originally was seen in the multidisciplinary breast clinic for discussion of new diagnosis of left breast cancer. She is now status post left mastectomy with the final pathology revealing an invasive ductal carcinoma that is ER positive PR positive HER-2/neu positive with Ki-67 11%.  PRIOR THERAPY:  #1 patient was originally seen in the multidisciplinary breast clinic on 11/30/2011 for what at that time looked like a ductal carcinoma in situ of the left breast. Due to the extent of disease she was recommended a mastectomy with axillary lymph node sampling.  #2 12/19/2011 patient had a left mastectomy with sentinel lymph node biopsy. Her final pathology revealed 2 foci of invasive ductal carcinoma measuring 0.8 and 0.6 cm. The tumor was low grade ER positive PR positive. Patient's tumor was found to be HER-2/neu positive with HER-2 ratio of 3.27. Ki-67 was 11%. Patient had sentinel lymph node  Biopsies performed one of 4 lymph nodes were positive for metastatic disease. Patient's final pathologic staging is T1 N1 M0. Stage II  #3 patient has received adjuvant chemotherapy consisting of Taxotere carboplatinum and Herceptin for 2 cycles. This was then complicated by development of bacteremia left knee sepsis requiring revision of the knee. Port-A-Cath site infection requiring removal of the Port-A-Cath and placement of a PICC line.  #4 patient has been receiving Herceptin every 3 weeks with radiation therapy this is to continue until patient finishes up her antibiotics then we may consider her completing her 2 cycles of Taxotere and carboplatinum combination.  #5 patient has now completed her radiation therapy on 06/04/2012. Overall she tolerated it well.  #6  patient will now begin Taxotere carboplatinum and Herceptin combination to finish out her last 2 cycles. We discussed the risks and benefits of this again. We will plan on starting the regimen on February, 14 2014.patient was begun on [facility] on 08/03/2012. With the hope of finishing of 4 cycles of TCH.  CURRENT THERAPY: TCH cycle 4 day 1 with Neulasta support and weekly Herceptin  INTERVAL HISTORY: Julie Ross 52 y.o. female returns for Followup visit.  Her knee is doing well and she continues on antibiotics for her left knee MSSA septic arthritis.  She denies fevers, chills, nausea, vomiting, headaches, shortness of breath or any other concerns.  She is ready to proceed with chemotherapy.    MEDICAL HISTORY: Past Medical History  Diagnosis Date  . Hypertension   . Hypothyroidism   . Arthritis   . Neuromuscular disorder     MS TX WITH CYMBALTA   . Multiple sclerosis   . H/O colonoscopy   . H/O bone density study 03/2011  . Wears glasses   . Heart burn   . Depression   . Sleep apnea     MILD SLEEP APNEA, NO MACHINE  6 YRS AGO HPT [facility]   . Breast cancer     lul/ER+PR+    ALLERGIES:  has No Known Allergies.  MEDICATIONS:  Current Outpatient Prescriptions  Medication Sig Dispense Refill  . ALPRAZolam (XANAX) 0.5 MG tablet Take 1 tablet (0.5 mg total) by mouth 3 (three) times daily as needed for anxiety.  15 tablet  0  . amLODipine (NORVASC) 10 MG tablet Take 1 tablet (10 mg total) by mouth daily.  30 tablet  3  .  aspirin EC 325 MG EC tablet Take 1 tablet (325 mg total) by mouth 2 (two) times daily.  30 tablet  0  . atenolol (TENORMIN) 25 MG tablet Take 0.5 tablets (12.5 mg total) by mouth daily. For blood pressure  15 tablet  0  . baclofen (LIORESAL) 10 MG tablet 10 mg 3 (three) times daily.       Marland Kitchen CALCIUM-MAGNESIUM PO Take 1 tablet by mouth daily.      Marland Kitchen ceFAZolin (ANCEF) 2-3 GM-% SOLR Inject 50 mLs (2 g total) into the vein every 8 (eight) hours.  126 each  0  . dexamethasone  (DECADRON) 4 MG tablet SEE NOTES  30 tablet  0  . Diclofenac-Misoprostol 75-0.2 MG TBEC Take 75 mg by mouth Twice daily as needed (for inflammation). Take 1 by mouth twice daily as needed      . DULoxetine (CYMBALTA) 30 MG capsule Take 3 capsules (90 mg total) by mouth daily.  30 capsule  0  . esomeprazole (NEXIUM) 40 MG capsule Take 40 mg by mouth daily before breakfast.      . hydrOXYzine (ATARAX/VISTARIL) 25 MG tablet Take 25 mg by mouth every 8 (eight) hours as needed for itching.       . IRON PO Take 45 mg by mouth every other day.       . losartan-hydrochlorothiazide (HYZAAR) 100-25 MG per tablet Take 1 tablet by mouth daily.       . Multiple Vitamins-Minerals (MULTIVITAMINS THER. W/MINERALS) TABS Take 1 tablet by mouth daily.      . ondansetron (ZOFRAN) 8 MG tablet Take 8 mg by mouth every 8 (eight) hours as needed for nausea.       . OxyCODONE (OXYCONTIN) 20 mg T12A Take 1 tablet (20 mg total) by mouth every 12 (twelve) hours.  30 tablet  0  . oxyCODONE-acetaminophen (PERCOCET/ROXICET) 5-325 MG per tablet Take 1-2 tablets by mouth every 6 (six) hours as needed for pain.  60 tablet  0  . Probiotic Product (PROBIOTIC DAILY PO) Take 1 capsule by mouth daily.       . prochlorperazine (COMPAZINE) 10 MG tablet Take 1 tablet (10 mg total) by mouth every 6 (six) hours as needed.  30 tablet  0   No current facility-administered medications for this visit.   Facility-Administered Medications Ordered in Other Visits  Medication Dose Route Frequency Provider Last Rate Last Dose  . ceFAZolin (ANCEF) IVPB 2 g/50 mL premix    PRN Lance Coon, CRNA   2 g at 08/18/12 1553  . dexamethasone (DECADRON) injection    PRN Lance Coon, CRNA   8 mg at 05/01/12 0908  . fentaNYL (SUBLIMAZE) injection    PRN Lance Coon, CRNA   100 mcg at 05/01/12 0856  . lidocaine (cardiac) 100 mg/2ml (XYLOCAINE) 20 MG/ML injection 2%    PRN Lance Coon, CRNA   60 mg at 05/01/12 0856  . midazolam (VERSED) 5 MG/5ML  injection    PRN Lance Coon, CRNA   2 mg at 05/01/12 0856  . propofol (DIPRIVAN) 10 mg/mL bolus/IV push    PRN Lance Coon, CRNA   160 mg at 05/01/12 0900    SURGICAL HISTORY:  Past Surgical History  Procedure Laterality Date  . Tonsillectomy    . Tumor removal      FROM PELVIS AGE 52  . Cesarean section      X2   . No past surgeries      BLADDER SLING  4-5 YRS  AGO   . Knee arthroscopy      LT KNEE 04/2011  . Total knee arthroplasty  08/15/2011    Procedure: TOTAL KNEE ARTHROPLASTY; lft Surgeon: Harvie Junior, MD;  Location: [facility] OR;  Service: Orthopedics;  Laterality: Left;  RIGHT KNEE CORTIZONE INJECTION  . Mohs surgery      right face  . Knee arthroscopy  82    rt  . Joint replacement  Feb 2013  . Mastectomy w/ sentinel node biopsy  12/19/2011    Procedure: MASTECTOMY WITH SENTINEL LYMPH NODE BIOPSY;  Surgeon: Currie Paris, MD;  Location: [facility] OR;  Service: General;  Laterality: Left;  left breast and left axilla   . Portacath placement  01/16/2012    Procedure: INSERTION PORT-A-CATH;  Surgeon: Currie Paris, MD;  Location: [facility];  Service: General;  Laterality: Right;  Porta Cath Placement   . I&d knee with poly exchange  03/02/2012    Procedure: IRRIGATION AND DEBRIDEMENT KNEE WITH POLY EXCHANGE;  Surgeon: Harvie Junior, MD;  Location: [facility] OR;  Service: Orthopedics;  Laterality: Left;  I&D of total knee with Possible Poly Exchange   . Tee without cardioversion  03/06/2012    Procedure: TRANSESOPHAGEAL ECHOCARDIOGRAM (TEE);  Surgeon: Wendall Stade, MD;  Location: [facility] ENDOSCOPY;  Service: Cardiovascular;  Laterality: N/A;  Rm. [id number]  . Port-a-cath removal  03/06/2012    Procedure: REMOVAL PORT-A-CATH;  Surgeon: Adolph Pollack, MD;  Location: [facility] OR;  Service: General;  Laterality: N/A;  . Portacath placement  05/01/2012    Procedure: INSERTION PORT-A-CATH;  Surgeon: Currie Paris, MD;  Location: [facility];  Service: General;   Laterality: Right;  right internal jugular port-a-cath insertion  . Total knee arthroplasty Left 08/18/2012    Procedure:  Irrigation and debridement of LEFT total knee;  Removal of total knee parts; Implant of spacers;  Surgeon: Nestor Lewandowsky, MD;  Location[facility];  Service: Orthopedics;  Laterality: Left;  . Port-a-cath removal Right 08/22/2012    Procedure: REMOVAL PORT-A-CATH;  Surgeon: Mariella Saa, MD;  Location: [facility] OR;  Service: General;  Laterality: Right;    REVIEW OF SYSTEMS:   General: fatigue (+), night sweats (-), fever (-), pain (-) Lymph: palpable nodes (-) HEENT: vision changes (-), mucositis (-), gum bleeding (-), epistaxis (-) Cardiovascular: chest pain (-), palpitations (-) Pulmonary: shortness of breath (-), dyspnea on exertion (-), cough (-), hemoptysis (-) GI:  Early satiety (-), melena (-), dysphagia (-), nausea/vomiting (-), diarrhea (-) GU: dysuria (-), hematuria (-), incontinence (-) Musculoskeletal: joint swelling (-), joint pain (-), back pain (-) Neuro: weakness (-), numbness (-), headache (-), confusion (-) Skin: Rash (-), lesions (-), dryness (-) Psych: depression (-), suicidal/homicidal ideation (-), feeling of hopelessness (-)   PHYSICAL EXAMINATION:  BP 156/94  Pulse 61  Temp(Src) 98.3 F (36.8 C) (Oral)  Resp 20  Ht 5\' 6"  (1.676 m)  Wt 194 lb 3.2 oz (88.089 kg)  BMI 31.36 kg/m2 General: Patient is a well appearing female in no acute distress HEENT: PERRLA, sclerae anicteric no conjunctival pallor, MMM Neck: supple, no palpable adenopathy Lungs: clear to auscultation bilaterally, no wheezes, rhonchi, or rales Cardiovascular: regular rate rhythm, S1, S2, no murmurs, rubs or gallops Abdomen: Soft, non-tender, non-distended, normoactive bowel sounds, no HSM Extremities: warm and well perfused, no clubbing, cyanosis, or edema Skin: No rashes or lesions Neuro: Non-focal Breasts: left chest wall mastectomy site no nodularity, erythematous from  radiation.  ECOG PERFORMANCE STATUS: 1 - Symptomatic but completely ambulatory  LABORATORY DATA: Lab Results  Component Value Date   WBC 8.8 09/21/2012   HGB 10.3* 09/21/2012   HCT 30.8* 09/21/2012   MCV 83.5 09/21/2012   PLT 230 09/21/2012      Chemistry      Component Value Date/Time   NA 139 09/14/2012 1312   NA 138 09/01/2012 1420   K 4.0 09/14/2012 1312   K 3.7 09/01/2012 1420   CL 102 09/14/2012 1312   CL 99 09/01/2012 1420   CO2 25 09/14/2012 1312   CO2 28 09/01/2012 1420   BUN 27.6* 09/14/2012 1312   BUN 17 09/01/2012 1420   CREATININE 1.3* 09/14/2012 1312   CREATININE 1.03 09/01/2012 1420      Component Value Date/Time   CALCIUM 9.6 09/14/2012 1312   CALCIUM 9.3 09/01/2012 1420   ALKPHOS 310* 09/14/2012 1312   ALKPHOS 400* 09/01/2012 1420   AST 21 09/14/2012 1312   AST 28 09/01/2012 1420   ALT <6 Repeated and Verified 09/14/2012 1312   ALT <5 09/01/2012 1420   BILITOT 0.58 09/14/2012 1312   BILITOT 0.4 09/01/2012 1420     DOB: Mar 23, 1961 Age: 44 Gender: F Client Name [place] Collected Date: 12/19/2011 Received Date: 12/19/2011 Physician: Cyndia Bent Chart #: MRN # : [id number] Physician cc: Richardean Chimera, MD Sherian Rein, MD Race:W Visit #: [id number] Kaylyn Lim, RN REPORT OF SURGICAL PATHOLOGY ADDITIONAL INFORMATION: 5. PROGNOSTIC INDICATORS - ACIS Results IMMUNOHISTOCHEMICAL AND MORPHOMETRIC ANALYSIS BY THE AUTOMATED CELLULAR IMAGING SYSTEM (ACIS) Estrogen Receptor (Negative, <1%): 100%, STRONG STAINING INTENSITY Progesterone Receptor (Negative, <1%): 100%, STRONG STAINING INTENSITY Proliferation Marker Ki67 by M IB-1 (Low<20%): 11% All controls stained appropriately Pecola Leisure MD Pathologist, Electronic Signature ( Signed 12/29/2011) 5. CHROMOGENIC IN-SITU HYBRIDIZATION Interpretation: HER2/NEU BY CISH - SHOWS AMPLIFICATION BY CISH ANALYSIS. THE RATIO OF HER2: CEP 17 SIGNALS WAS 3.27 Reference range: Ratio: HER2:CEP17 < 1.8 gene amplification  not observed Ratio: HER2:CEP 17 1.8-2.2 - equivocal result Ratio: HER2:CEP17 > 2.2 - gene amplification observed Pecola Leisure MD Pathologist, Electronic Signature ( Signed 12/27/2011) 1 of 4 FINAL for Ross, Julie M ([id number]) FINAL DIAGNOSIS Diagnosis 1. Lymph node, sentinel, biopsy, Left axillary - ONE BENIGN LYMPH NODE WITH NO TUMOR SEEN (0/1). 2. Lymph node, sentinel, biopsy, Left axilla - ONE LYMPH NODE POSITIVE FOR METASTATIC DUCTAL CARCINOMA (1/1). - SEE COMMENT. 3. Lymph node, sentinel, biopsy, Left axilla - ONE BENIGN LYMPH NODE WITH NO TUMOR SEEN (0/1). 4. Lymph node, sentinel, biopsy, Left axilla - ONE BENIGN LYMPH NODE WITH NO TUMOR SEEN (0/1). 5. Breast, simple mastectomy, Left - INVASIVE GRADE I DUCTAL CARCINOMA, TWO FOCI MEASURING 0.8 CM AND 0.6 CM ARISING IN A BACKGROUND OF EXTENSIVE INTERMEDIATE GRADE DUCTAL CARCINOMA IN SITU WITH NECROSIS. - MARGINS ARE NEGATIVE. - SEE ONCOLOGY TEMPLATE. Microscopic Comment 2. On re-review of the frozen section slides, no metastatic carcinoma is identified. The carcinoma present is on permanent sections only. 5. BREAST, INVASIVE TUMOR, WITH LYMPH NODE SAMPLING Specimen, including laterality: Left breast with sentinel lymph node. Procedure: Left simple mastectomy with sentinel lymph node biopsy. Grade: I (Both foci of tumors show similar morphology). Tubule formation: 2. Nuclear pleomorphism: 2. Mitotic: 1. Tumor size (glass slide measurement): Two foci measuring 0.8 cm and 0.6 cm. Margins: Invasive, distance to closest margin: At least 1.5 cm. In-situ, distance to closest margin: At least 1.5 cm. Lymphovascular invasion: No definite lymph/vascular invasion is not identified however a sentinel lymph  node is positive (see below). Ductal carcinoma in situ: Yes. Grade: Intermediate grade. Extensive intraductal component: Yes. Lobular neoplasia: Not identified. Tumor focality: Two foci. Treatment effect: Not  applicable. Extent of tumor: Tumor confined to breast parenchyma. Lymph nodes: # examined: 4. Lymph nodes with metastasis: 1. Macrometastasis: (> 2.0 mm): 1. Extracapsular extension: Not identified. Breast prognostic profile: A breast prognostic profile will be performed (see below comment). Additional breast findings: Fibrocystic changes with usual ductal hyperplasia. TNM: mpT1b, pN1a, MX. Comment: The palpable nodular area seen grossly is comprised predominantly of intermediate grade ductal carcinoma in situ with only two foci of invasive ductal carcinoma. Both invasive foci are morphologically similar. As such a breast prognostic profile will only be performed on the larger tumor. A breast prognostic profile can be performed upon the smaller tumor per request. (RH:kh 12-21-11) 2 of 4   ASSESSMENT: 52 year old female with  #1 stage II invasive ductal carcinoma of the left breast with ductal carcinoma in situ. Patient is status post mastectomy that showed 2 foci of invasive cancer measuring 0.8 0.6 cm. The tumor is ER positive PR positive HER-2/neu amplified at 3.27. Patient is now seen in medical oncology for discussion of adjuvant treatment. As mentioned above her case was discussed at the multidisciplinary breast conference. Patient is noted to have one of 4 lymph nodes positive for metastatic disease. This was only axillary sentinel node biopsy. A discussion of whether patient under should undergo a full axillary lymph node dissection did take place but at the end of our extensive discussion it was recommended the patient will proceed with radiation and DOS she would feel the criteria of seed 11. Therefore is excellent lymph node dissection is not being done. Instead the patient will proceed with adjuvant chemotherapy and Herceptin. Her chemotherapy will consist of Taxotere carboplatinum given every 21 days. For the duration of her chemotherapy patient will get Herceptin on a weekly basis. Once  she completes the chemotherapy she then will get Herceptin every 3 weeks. After completion of chemotherapy patient will proceed with radiation therapy with concurrent Herceptin. After completion of radiation she will start Herceptin and antiestrogen therapy consisting of either tamoxifen or an aromatase inhibitor.  #2 patient has now completed radiation therapy and Herceptin combination. Her radiation completed on 06/04/2012.  #3 we will reinitiate combination chemotherapy and Herceptin on 08/03/2012. The chemotherapy will consist of Taxotere or and carboplatinum with Herceptin. The Taxotere carboplatinum will be given every 21 days to complete out 2 more cycles. Herceptin will be given on a weekly basis.  PLAN:  #1 Julie Ross is doing well.  She will proceed with [facility] today.    #2 Patient will continue to see her orthopedic surgeon, Dr. Luiz Blare for her right knee.  #3 she will return in 1 week for labs, appt, and Herceptin.    All questions were answered. The patient knows to call the clinic with any problems, questions or concerns. We can certainly see the patient much sooner if necessary.  I spent 25 minutes counseling the patient face to face. The total time spent in the appointment was 30 minutes.  Cherie Ouch Lyn Hollingshead, NP Medical Oncology [facility] Phone: [phone]    09/21/2012, 10:49 AM

## 2012-09-21 NOTE — Patient Instructions (Signed)
Doing well.  Proceed with chemotherapy.  Please call us if you have any questions or concerns.    

## 2012-09-22 ENCOUNTER — Ambulatory Visit (HOSPITAL_BASED_OUTPATIENT_CLINIC_OR_DEPARTMENT_OTHER): Payer: 59

## 2012-09-22 VITALS — BP 167/87 | HR 65 | Temp 97.4°F

## 2012-09-22 DIAGNOSIS — Z5189 Encounter for other specified aftercare: Secondary | ICD-10-CM

## 2012-09-22 DIAGNOSIS — C50419 Malignant neoplasm of upper-outer quadrant of unspecified female breast: Secondary | ICD-10-CM

## 2012-09-22 MED ORDER — PEGFILGRASTIM INJECTION 6 MG/0.6ML
6.0000 mg | Freq: Once | SUBCUTANEOUS | Status: AC
  Administered 2012-09-22: 6 mg via SUBCUTANEOUS

## 2012-09-28 ENCOUNTER — Ambulatory Visit (HOSPITAL_BASED_OUTPATIENT_CLINIC_OR_DEPARTMENT_OTHER): Payer: 59

## 2012-09-28 ENCOUNTER — Other Ambulatory Visit (HOSPITAL_BASED_OUTPATIENT_CLINIC_OR_DEPARTMENT_OTHER): Payer: 59 | Admitting: Lab

## 2012-09-28 ENCOUNTER — Encounter: Payer: Self-pay | Admitting: Adult Health

## 2012-09-28 ENCOUNTER — Telehealth: Payer: Self-pay | Admitting: Oncology

## 2012-09-28 ENCOUNTER — Ambulatory Visit (HOSPITAL_BASED_OUTPATIENT_CLINIC_OR_DEPARTMENT_OTHER): Payer: 59 | Admitting: Adult Health

## 2012-09-28 ENCOUNTER — Telehealth: Payer: Self-pay | Admitting: *Deleted

## 2012-09-28 VITALS — BP 130/85 | HR 80 | Temp 98.1°F | Resp 20 | Ht 66.0 in | Wt 188.8 lb

## 2012-09-28 DIAGNOSIS — C50419 Malignant neoplasm of upper-outer quadrant of unspecified female breast: Secondary | ICD-10-CM

## 2012-09-28 DIAGNOSIS — Z5112 Encounter for antineoplastic immunotherapy: Secondary | ICD-10-CM

## 2012-09-28 DIAGNOSIS — C50412 Malignant neoplasm of upper-outer quadrant of left female breast: Secondary | ICD-10-CM

## 2012-09-28 DIAGNOSIS — C773 Secondary and unspecified malignant neoplasm of axilla and upper limb lymph nodes: Secondary | ICD-10-CM

## 2012-09-28 DIAGNOSIS — F329 Major depressive disorder, single episode, unspecified: Secondary | ICD-10-CM

## 2012-09-28 LAB — CBC WITH DIFFERENTIAL/PLATELET
BASO%: 0.2 % (ref 0.0–2.0)
Basophils Absolute: 0 10*3/uL (ref 0.0–0.1)
EOS%: 5.6 % (ref 0.0–7.0)
Eosinophils Absolute: 0.3 10*3/uL (ref 0.0–0.5)
HCT: 28.8 % — ABNORMAL LOW (ref 34.8–46.6)
HGB: 9.7 g/dL — ABNORMAL LOW (ref 11.6–15.9)
LYMPH%: 20 % (ref 14.0–49.7)
MCH: 28 pg (ref 25.1–34.0)
MCHC: 33.7 g/dL (ref 31.5–36.0)
MCV: 83 fL (ref 79.5–101.0)
MONO#: 0.6 10*3/uL (ref 0.1–0.9)
MONO%: 12.2 % (ref 0.0–14.0)
NEUT#: 3.2 10*3/uL (ref 1.5–6.5)
NEUT%: 62 % (ref 38.4–76.8)
Platelets: 113 10*3/uL — ABNORMAL LOW (ref 145–400)
RBC: 3.47 10*6/uL — ABNORMAL LOW (ref 3.70–5.45)
RDW: 15 % — ABNORMAL HIGH (ref 11.2–14.5)
WBC: 5.2 10*3/uL (ref 3.9–10.3)
lymph#: 1 10*3/uL (ref 0.9–3.3)
nRBC: 0 % (ref 0–0)

## 2012-09-28 LAB — COMPREHENSIVE METABOLIC PANEL (CC13)
ALT: <6 U/L (ref 0–55)
AST: 13 U/L (ref 5–34)
Albumin: 2.8 g/dL — ABNORMAL LOW (ref 3.5–5.0)
Alkaline Phosphatase: 185 U/L — ABNORMAL HIGH (ref 40–150)
BUN: 23.1 mg/dL (ref 7.0–26.0)
CO2: 25 mEq/L (ref 22–29)
Calcium: 8.5 mg/dL (ref 8.4–10.4)
Chloride: 100 mEq/L (ref 98–107)
Creatinine: 1 mg/dL (ref 0.6–1.1)
Glucose: 112 mg/dl — ABNORMAL HIGH (ref 70–99)
Potassium: 3.4 mEq/L — ABNORMAL LOW (ref 3.5–5.1)
Sodium: 134 mEq/L — ABNORMAL LOW (ref 136–145)
Total Bilirubin: 0.47 mg/dL (ref 0.20–1.20)
Total Protein: 6 g/dL — ABNORMAL LOW (ref 6.4–8.3)

## 2012-09-28 MED ORDER — SODIUM CHLORIDE 0.9 % IV SOLN
Freq: Once | INTRAVENOUS | Status: AC
  Administered 2012-09-28: 12:00:00 via INTRAVENOUS

## 2012-09-28 MED ORDER — HEPARIN SOD (PORK) LOCK FLUSH 100 UNIT/ML IV SOLN
500.0000 [IU] | Freq: Once | INTRAVENOUS | Status: AC | PRN
  Administered 2012-09-28: 250 [IU]
  Filled 2012-09-28: qty 5

## 2012-09-28 MED ORDER — DIPHENHYDRAMINE HCL 25 MG PO CAPS
50.0000 mg | ORAL_CAPSULE | Freq: Once | ORAL | Status: AC
  Administered 2012-09-28: 50 mg via ORAL

## 2012-09-28 MED ORDER — SODIUM CHLORIDE 0.9 % IJ SOLN
10.0000 mL | INTRAMUSCULAR | Status: DC | PRN
  Administered 2012-09-28: 10 mL
  Filled 2012-09-28: qty 10

## 2012-09-28 MED ORDER — TRASTUZUMAB CHEMO INJECTION 440 MG
2.0000 mg/kg | Freq: Once | INTRAVENOUS | Status: AC
  Administered 2012-09-28: 168 mg via INTRAVENOUS
  Filled 2012-09-28: qty 8

## 2012-09-28 MED ORDER — ACETAMINOPHEN 325 MG PO TABS
650.0000 mg | ORAL_TABLET | Freq: Once | ORAL | Status: AC
  Administered 2012-09-28: 650 mg via ORAL

## 2012-09-28 NOTE — Telephone Encounter (Signed)
Per staff phone call and POF I have schedueld appts.  JMW  

## 2012-09-28 NOTE — Patient Instructions (Addendum)
Doing well.  Proceed with herceptin.  I will have one of our social workers get in touch with you about support programs.  Please call us if you have any questions or concerns.

## 2012-09-28 NOTE — Progress Notes (Signed)
OFFICE PROGRESS NOTE  CC  [name] [address] [place] [address] Dr. Cyndia Bent Dr. Richardean Chimera Dr. Mardella Layman Dr. Gustavus Messing ([facility])  DIAGNOSIS: 52 year old female who originally was seen in the multidisciplinary breast clinic for discussion of new diagnosis of left breast cancer. She is now status post left mastectomy with the final pathology revealing an invasive ductal carcinoma that is ER positive PR positive HER-2/neu positive with Ki-67 11%.  PRIOR THERAPY:  #1 patient was originally seen in the multidisciplinary breast clinic on 11/30/2011 for what at that time looked like a ductal carcinoma in situ of the left breast. Due to the extent of disease she was recommended a mastectomy with axillary lymph node sampling.  #2 12/19/2011 patient had a left mastectomy with sentinel lymph node biopsy. Her final pathology revealed 2 foci of invasive ductal carcinoma measuring 0.8 and 0.6 cm. The tumor was low grade ER positive PR positive. Patient's tumor was found to be HER-2/neu positive with HER-2 ratio of 3.27. Ki-67 was 11%. Patient had sentinel lymph node  Biopsies performed one of 4 lymph nodes were positive for metastatic disease. Patient's final pathologic staging is T1 N1 M0. Stage II  #3 patient has received adjuvant chemotherapy consisting of Taxotere carboplatinum and Herceptin for 2 cycles starting in 01/2012. This was then complicated by development of bacteremia left knee sepsis requiring revision of the knee. Port-A-Cath site infection requiring removal of the Port-A-Cath and placement of a PICC line.  #4 patient has been receiving Herceptin every 3 weeks with radiation therapy this is to continue until patient finishes up her antibiotics then we may consider her completing her 2 cycles of Taxotere and carboplatinum combination.  #5 patient has now completed her radiation therapy on 06/04/2012. Overall she  tolerated it well.  #6 patient will now begin Taxotere carboplatinum and Herceptin combination to finish out her last 2 cycles. We discussed the risks and benefits of this again. We will plan on starting the regimen on February, 14 2014.patient was begun on [facility] on 08/03/2012. With the hope of finishing of 4 cycles of TCH.  CURRENT THERAPY: TCH cycle 4 day 8 with Neulasta support and weekly Herceptin  INTERVAL HISTORY: Shaniya Tashiro Meister 52 y.o. female returns for Followup visit.  She received her fourth cycle of TCH last week.  She had nausea and diarrhea along with stomach cramping after receiving the chemotherapy.  She's very depressed.  She doesn't like to do things that she normally does.  Her attitude is worse, along with feeling very down.   She is very tearful and requesting assistance.  She denies fevers, chills, vomiting, constipation, or numbness.  Her nausea and diarrhea are improved.    MEDICAL HISTORY: Past Medical History  Diagnosis Date  . Hypertension   . Hypothyroidism   . Arthritis   . Neuromuscular disorder     MS TX WITH CYMBALTA   . Multiple sclerosis   . H/O colonoscopy   . H/O bone density study 03/2011  . Wears glasses   . Heart burn   . Depression   . Sleep apnea     MILD SLEEP APNEA, NO MACHINE  6 YRS AGO HPT [facility]   . Breast cancer     lul/ER+PR+    ALLERGIES:  has No Known Allergies.  MEDICATIONS:  Current Outpatient Prescriptions  Medication Sig Dispense Refill  . ALPRAZolam (XANAX) 0.5 MG tablet Take 1 tablet (0.5 mg total) by mouth  3 (three) times daily as needed for anxiety.  15 tablet  0  . amLODipine (NORVASC) 10 MG tablet Take 1 tablet (10 mg total) by mouth daily.  30 tablet  3  . aspirin EC 325 MG EC tablet Take 1 tablet (325 mg total) by mouth 2 (two) times daily.  30 tablet  0  . atenolol (TENORMIN) 25 MG tablet Take 0.5 tablets (12.5 mg total) by mouth daily. For blood pressure  15 tablet  0  . baclofen (LIORESAL) 10 MG tablet 10 mg 3  (three) times daily.       Marland Kitchen CALCIUM-MAGNESIUM PO Take 1 tablet by mouth daily.      Marland Kitchen ceFAZolin (ANCEF) 2-3 GM-% SOLR Inject 50 mLs (2 g total) into the vein every 8 (eight) hours.  126 each  0  . cephALEXin (KEFLEX) 500 MG capsule       . dexamethasone (DECADRON) 4 MG tablet SEE NOTES  30 tablet  0  . Diclofenac-Misoprostol 75-0.2 MG TBEC Take 75 mg by mouth Twice daily as needed (for inflammation). Take 1 by mouth twice daily as needed      . DULoxetine (CYMBALTA) 30 MG capsule Take 3 capsules (90 mg total) by mouth daily.  30 capsule  0  . esomeprazole (NEXIUM) 40 MG capsule Take 40 mg by mouth daily before breakfast.      . losartan-hydrochlorothiazide (HYZAAR) 100-25 MG per tablet Take 1 tablet by mouth daily.       . Multiple Vitamins-Minerals (MULTIVITAMINS THER. W/MINERALS) TABS Take 1 tablet by mouth daily.      . ondansetron (ZOFRAN) 8 MG tablet Take 8 mg by mouth every 8 (eight) hours as needed for nausea.       . OxyCODONE (OXYCONTIN) 20 mg T12A Take 1 tablet (20 mg total) by mouth every 12 (twelve) hours.  30 tablet  0  . oxyCODONE-acetaminophen (PERCOCET/ROXICET) 5-325 MG per tablet Take 1-2 tablets by mouth every 6 (six) hours as needed for pain.  60 tablet  0  . Probiotic Product (PROBIOTIC DAILY PO) Take 1 capsule by mouth daily.       . prochlorperazine (COMPAZINE) 10 MG tablet Take 1 tablet (10 mg total) by mouth every 6 (six) hours as needed.  30 tablet  0   No current facility-administered medications for this visit.   Facility-Administered Medications Ordered in Other Visits  Medication Dose Route Frequency Provider Last Rate Last Dose  . ceFAZolin (ANCEF) IVPB 2 g/50 mL premix    PRN Lance Coon, CRNA   2 g at 08/18/12 1553  . dexamethasone (DECADRON) injection    PRN Lance Coon, CRNA   8 mg at 05/01/12 0908  . fentaNYL (SUBLIMAZE) injection    PRN Lance Coon, CRNA   100 mcg at 05/01/12 0856  . lidocaine (cardiac) 100 mg/108ml (XYLOCAINE) 20 MG/ML injection 2%     PRN Lance Coon, CRNA   60 mg at 05/01/12 0856  . midazolam (VERSED) 5 MG/5ML injection    PRN Lance Coon, CRNA   2 mg at 05/01/12 0856  . propofol (DIPRIVAN) 10 mg/mL bolus/IV push    PRN Lance Coon, CRNA   160 mg at 05/01/12 0900    SURGICAL HISTORY:  Past Surgical History  Procedure Laterality Date  . Tonsillectomy    . Tumor removal      FROM PELVIS AGE 25  . Cesarean section      X2   . No past surgeries  BLADDER SLING  4-5 YRS AGO   . Knee arthroscopy      LT KNEE 04/2011  . Total knee arthroplasty  08/15/2011    Procedure: TOTAL KNEE ARTHROPLASTY; lft Surgeon: Harvie Junior, MD;  Location: [facility] OR;  Service: Orthopedics;  Laterality: Left;  RIGHT KNEE CORTIZONE INJECTION  . Mohs surgery      right face  . Knee arthroscopy  82    rt  . Joint replacement  Feb 2013  . Mastectomy w/ sentinel node biopsy  12/19/2011    Procedure: MASTECTOMY WITH SENTINEL LYMPH NODE BIOPSY;  Surgeon: Currie Paris, MD;  Location: [facility] OR;  Service: General;  Laterality: Left;  left breast and left axilla   . Portacath placement  01/16/2012    Procedure: INSERTION PORT-A-CATH;  Surgeon: Currie Paris, MD;  Location: [facility];  Service: General;  Laterality: Right;  Porta Cath Placement   . I&d knee with poly exchange  03/02/2012    Procedure: IRRIGATION AND DEBRIDEMENT KNEE WITH POLY EXCHANGE;  Surgeon: Harvie Junior, MD;  Location: [facility] OR;  Service: Orthopedics;  Laterality: Left;  I&D of total knee with Possible Poly Exchange   . Tee without cardioversion  03/06/2012    Procedure: TRANSESOPHAGEAL ECHOCARDIOGRAM (TEE);  Surgeon: Wendall Stade, MD;  Location: [facility];  Service: Cardiovascular;  Laterality: N/A;  Rm. [id number]  . Port-a-cath removal  03/06/2012    Procedure: REMOVAL PORT-A-CATH;  Surgeon: Adolph Pollack, MD;  Location: [facility] OR;  Service: General;  Laterality: N/A;  . Portacath placement  05/01/2012    Procedure: INSERTION PORT-A-CATH;  Surgeon:  Currie Paris, MD;  Location: [facility];  Service: General;  Laterality: Right;  right internal jugular port-a-cath insertion  . Total knee arthroplasty Left 08/18/2012    Procedure:  Irrigation and debridement of LEFT total knee;  Removal of total knee parts; Implant of spacers;  Surgeon: Nestor Lewandowsky, MD;  Location: [facility] OR;  Service: Orthopedics;  Laterality: Left;  . Port-a-cath removal Right 08/22/2012    Procedure: REMOVAL PORT-A-CATH;  Surgeon: Mariella Saa, MD;  Location: [facility] OR;  Service: General;  Laterality: Right;    REVIEW OF SYSTEMS:   General: fatigue (+), night sweats (-), fever (-), pain (-) Lymph: palpable nodes (-) HEENT: vision changes (-), mucositis (-), gum bleeding (-), epistaxis (-) Cardiovascular: chest pain (-), palpitations (-) Pulmonary: shortness of breath (-), dyspnea on exertion (-), cough (-), hemoptysis (-) GI:  Early satiety (-), melena (-), dysphagia (-), nausea/vomiting (-), diarrhea (-) GU: dysuria (-), hematuria (-), incontinence (-) Musculoskeletal: joint swelling (-), joint pain (-), back pain (-) Neuro: weakness (-), numbness (-), headache (-), confusion (-) Skin: Rash (-), lesions (-), dryness (-) Psych: depression (-), suicidal/homicidal ideation (-), feeling of hopelessness (-)   PHYSICAL EXAMINATION:  BP 130/85  Pulse 80  Temp(Src) 98.1 F (36.7 C) (Oral)  Resp 20  Ht 5\' 6"  (1.676 m)  Wt 188 lb 12.8 oz (85.639 kg)  BMI 30.49 kg/m2 General: Patient is a well appearing female in no acute distress HEENT: PERRLA, sclerae anicteric no conjunctival pallor, MMM Neck: supple, no palpable adenopathy Lungs: clear to auscultation bilaterally, no wheezes, rhonchi, or rales Cardiovascular: regular rate rhythm, S1, S2, no murmurs, rubs or gallops Abdomen: Soft, non-tender, non-distended, normoactive bowel sounds, no HSM Extremities: warm and well perfused, no clubbing, cyanosis, or edema Skin: No rashes or lesions Neuro:  Non-focal Breasts: left chest wall mastectomy site no  nodularity, erythematous from radiation.   ECOG PERFORMANCE STATUS: 1 - Symptomatic but completely ambulatory  LABORATORY DATA: Lab Results  Component Value Date   WBC 5.2 09/28/2012   HGB 9.7* 09/28/2012   HCT 28.8* 09/28/2012   MCV 83.0 09/28/2012   PLT 113* 09/28/2012      Chemistry      Component Value Date/Time   NA 134* 09/21/2012 1018   NA 138 09/01/2012 1420   K 4.1 09/21/2012 1018   K 3.7 09/01/2012 1420   CL 98 09/21/2012 1018   CL 99 09/01/2012 1420   CO2 25 09/21/2012 1018   CO2 28 09/01/2012 1420   BUN 28.4* 09/21/2012 1018   BUN 17 09/01/2012 1420   CREATININE 1.4* 09/21/2012 1018   CREATININE 1.03 09/01/2012 1420      Component Value Date/Time   CALCIUM 9.3 09/21/2012 1018   CALCIUM 9.3 09/01/2012 1420   ALKPHOS 261* 09/21/2012 1018   ALKPHOS 400* 09/01/2012 1420   AST 22 09/21/2012 1018   AST 28 09/01/2012 1420   ALT <6 Repeated and Verified 09/21/2012 1018   ALT <5 09/01/2012 1420   BILITOT 0.34 09/21/2012 1018   BILITOT 0.4 09/01/2012 1420     DOB: April 21, 1961 Age: 62 Gender: F Client Name [facility] Collected Date: 12/19/2011 Received Date: 12/19/2011 Physician: Cyndia Bent Chart #: MRN # : [id number] Physician cc: Richardean Chimera, MD Sherian Rein, MD Race:W Visit #: [id number] Kaylyn Lim, RN REPORT OF SURGICAL PATHOLOGY ADDITIONAL INFORMATION: 5. PROGNOSTIC INDICATORS - ACIS Results IMMUNOHISTOCHEMICAL AND MORPHOMETRIC ANALYSIS BY THE AUTOMATED CELLULAR IMAGING SYSTEM (ACIS) Estrogen Receptor (Negative, <1%): 100%, STRONG STAINING INTENSITY Progesterone Receptor (Negative, <1%): 100%, STRONG STAINING INTENSITY Proliferation Marker Ki67 by M IB-1 (Low<20%): 11% All controls stained appropriately Pecola Leisure MD Pathologist, Electronic Signature ( Signed 12/29/2011) 5. CHROMOGENIC IN-SITU HYBRIDIZATION Interpretation: HER2/NEU BY CISH - SHOWS AMPLIFICATION BY CISH ANALYSIS. THE RATIO OF HER2: CEP  17 SIGNALS WAS 3.27 Reference range: Ratio: HER2:CEP17 < 1.8 gene amplification not observed Ratio: HER2:CEP 17 1.8-2.2 - equivocal result Ratio: HER2:CEP17 > 2.2 - gene amplification observed Pecola Leisure MD Pathologist, Electronic Signature ( Signed 12/27/2011) 1 of 4 FINAL for Hemmer, Deaja M ([id number]) FINAL DIAGNOSIS Diagnosis 1. Lymph node, sentinel, biopsy, Left axillary - ONE BENIGN LYMPH NODE WITH NO TUMOR SEEN (0/1). 2. Lymph node, sentinel, biopsy, Left axilla - ONE LYMPH NODE POSITIVE FOR METASTATIC DUCTAL CARCINOMA (1/1). - SEE COMMENT. 3. Lymph node, sentinel, biopsy, Left axilla - ONE BENIGN LYMPH NODE WITH NO TUMOR SEEN (0/1). 4. Lymph node, sentinel, biopsy, Left axilla - ONE BENIGN LYMPH NODE WITH NO TUMOR SEEN (0/1). 5. Breast, simple mastectomy, Left - INVASIVE GRADE I DUCTAL CARCINOMA, TWO FOCI MEASURING 0.8 CM AND 0.6 CM ARISING IN A BACKGROUND OF EXTENSIVE INTERMEDIATE GRADE DUCTAL CARCINOMA IN SITU WITH NECROSIS. - MARGINS ARE NEGATIVE. - SEE ONCOLOGY TEMPLATE. Microscopic Comment 2. On re-review of the frozen section slides, no metastatic carcinoma is identified. The carcinoma present is on permanent sections only. 5. BREAST, INVASIVE TUMOR, WITH LYMPH NODE SAMPLING Specimen, including laterality: Left breast with sentinel lymph node. Procedure: Left simple mastectomy with sentinel lymph node biopsy. Grade: I (Both foci of tumors show similar morphology). Tubule formation: 2. Nuclear pleomorphism: 2. Mitotic: 1. Tumor size (glass slide measurement): Two foci measuring 0.8 cm and 0.6 cm. Margins: Invasive, distance to closest margin: At least 1.5 cm. In-situ, distance to closest margin: At least 1.5 cm. Lymphovascular invasion: No definite lymph/vascular invasion is  not identified however a sentinel lymph node is positive (see below). Ductal carcinoma in situ: Yes. Grade: Intermediate grade. Extensive intraductal component: Yes. Lobular  neoplasia: Not identified. Tumor focality: Two foci. Treatment effect: Not applicable. Extent of tumor: Tumor confined to breast parenchyma. Lymph nodes: # examined: 4. Lymph nodes with metastasis: 1. Macrometastasis: (> 2.0 mm): 1. Extracapsular extension: Not identified. Breast prognostic profile: A breast prognostic profile will be performed (see below comment). Additional breast findings: Fibrocystic changes with usual ductal hyperplasia. TNM: mpT1b, pN1a, MX. Comment: The palpable nodular area seen grossly is comprised predominantly of intermediate grade ductal carcinoma in situ with only two foci of invasive ductal carcinoma. Both invasive foci are morphologically similar. As such a breast prognostic profile will only be performed on the larger tumor. A breast prognostic profile can be performed upon the smaller tumor per request. (RH:kh 12-21-11) 2 of 4   ASSESSMENT: 52 year old female with  #1 stage II invasive ductal carcinoma of the left breast with ductal carcinoma in situ. Patient is status post mastectomy that showed 2 foci of invasive cancer measuring 0.8 0.6 cm. The tumor is ER positive PR positive HER-2/neu amplified at 3.27. Patient is now seen in medical oncology for discussion of adjuvant treatment. As mentioned above her case was discussed at the multidisciplinary breast conference. Patient is noted to have one of 4 lymph nodes positive for metastatic disease. This was only axillary sentinel node biopsy. A discussion of whether patient under should undergo a full axillary lymph node dissection did take place but at the end of our extensive discussion it was recommended the patient will proceed with radiation and DOS she would feel the criteria of seed 11. Therefore is excellent lymph node dissection is not being done. Instead the patient will proceed with adjuvant chemotherapy and Herceptin. Her chemotherapy will consist of Taxotere carboplatinum given every 21 days. For the  duration of her chemotherapy patient will get Herceptin on a weekly basis. Once she completes the chemotherapy she then will get Herceptin every 3 weeks. After completion of chemotherapy patient will proceed with radiation therapy with concurrent Herceptin. After completion of radiation she will start Herceptin and antiestrogen therapy consisting of either tamoxifen or an aromatase inhibitor.  #2 patient has now completed radiation therapy and Herceptin combination. Her radiation completed on 06/04/2012.  #3 we will reinitiate combination chemotherapy and Herceptin on 08/03/2012. The chemotherapy will consist of Taxotere or and carboplatinum with Herceptin. The Taxotere carboplatinum will be given every 21 days to complete out 2 more cycles. Herceptin will be given on a weekly basis.  PLAN:  #1 Ms. Noah is doing well.   She will proceed with Herceptin.  We spent a lot of the appointment discussing resources for her.  I called both of the social workers, and will get them in touch with her.    #2 Patient will continue to see her orthopedic surgeon, Dr. Luiz Blare for her right knee.  #3 she will now resume every 3 week Herceptin until August 2013.  We scheduled many of her appointments today.     All questions were answered. The patient knows to call the clinic with any problems, questions or concerns. We can certainly see the patient much sooner if necessary.  I spent 25 minutes counseling the patient face to face. The total time spent in the appointment was 30 minutes.  Cherie Ouch Lyn Hollingshead, NP Medical Oncology [facility] Phone: [phone] 09/28/2012, 1:02 PM

## 2012-09-28 NOTE — Patient Instructions (Addendum)

## 2012-10-01 ENCOUNTER — Ambulatory Visit (INDEPENDENT_AMBULATORY_CARE_PROVIDER_SITE_OTHER): Payer: 59 | Admitting: Internal Medicine

## 2012-10-01 VITALS — BP 157/95 | HR 72 | Temp 99.2°F | Wt 191.0 lb

## 2012-10-01 DIAGNOSIS — Z5189 Encounter for other specified aftercare: Secondary | ICD-10-CM

## 2012-10-01 DIAGNOSIS — Z96659 Presence of unspecified artificial knee joint: Secondary | ICD-10-CM

## 2012-10-01 DIAGNOSIS — T8459XD Infection and inflammatory reaction due to other internal joint prosthesis, subsequent encounter: Secondary | ICD-10-CM

## 2012-10-01 NOTE — Assessment & Plan Note (Signed)
She is doing very well with her current medication and has completed a complete 6 week course of Ancef. I am going to stop her Ancef and she already has a prescription for Keflex for continuation. She will return in one month on Keflex. She though has completed adequate therapy overall. She does continue to have her PICC line for her chemotherapy. I am going to check a sedimentation rate and a CRP though to assure it is not significantly elevated and would want me to restart her Ancef.

## 2012-10-01 NOTE — Progress Notes (Signed)
  Subjective:    Patient ID: Julie Ross, female    DOB: [birth year], 52 y.o.   MRN: [id number]  HPI 52 year old lady with breast cancer with left prosthetic knee with bacteremia in September 2013 and at the time had initial PJI and underwent polyexchange and port-a-cath removal. She completed 6 weeks of antibiotics IV then had been on Keflex. After resumption of chemotherapy she had recurrence of knee swelling and found once again to have PJI with MSSA, now s/p 2 stage revision on March 1st, 2014 . She is now 6 weeks into a 6 week course of high dose IV ancef. Knee pain continues to be better than before when simply touching kneee caused severe pain. She is concerned about reoccurence despite feeling better.  She continues to get chemo through her PICC line.     Review of Systems  Constitutional: Negative for fever, chills and fatigue.  Gastrointestinal: Negative for nausea, abdominal pain and diarrhea.  Musculoskeletal: Negative for joint swelling and arthralgias.  Skin: Negative for rash.  Neurological: Negative for dizziness and headaches.  Psychiatric/Behavioral: Positive for dysphoric mood.       Objective:   Physical Exam  Constitutional: She appears well-developed and well-nourished. No distress.  Cardiovascular: Normal rate, regular rhythm and normal heart sounds.  Exam reveals no gallop and no friction rub.   No murmur heard. Musculoskeletal:  Left leg in a brace          Assessment & Plan:

## 2012-10-03 ENCOUNTER — Encounter: Payer: Self-pay | Admitting: *Deleted

## 2012-10-03 NOTE — Progress Notes (Signed)
Clinical Social Worker received referral from nurse practitioner for assessment of needs.  CSW contacted pt at home to offer support and assess for needs.  Pt stated she was doing "ok", but was feeling "depressed and overwhelmed" due to the number of medical issues.  Pt informed CSW that she has had difficulties with her knee replacement and infection; which has caused her to pause her chemo treatments.  This eventually led to the hardware being removed from her knee.  CSw offered pt additional support and information on the support services at [facility].  Pt was agreeable to a reach to recovery referral, counseling referral, and was open to attending support group.  CSw encouraged pt tp call with any additional questions or concerns.    Tamala Julian, MSW, LCSW Clinical Social Worker [facility] [phone]

## 2012-10-08 ENCOUNTER — Telehealth: Payer: Self-pay | Admitting: *Deleted

## 2012-10-08 ENCOUNTER — Encounter: Payer: Self-pay | Admitting: *Deleted

## 2012-10-08 NOTE — Telephone Encounter (Signed)
[facility] RN called requesting clarification about patient's lab orders.  Pt completed IVABX last week.  RN wanted to know if she needed to draw the ESR and CRP this week.  Per last office note, these labs should be drawn.  RN notified, voiced understanding. Andree Coss, RN

## 2012-10-11 ENCOUNTER — Other Ambulatory Visit: Payer: Self-pay | Admitting: Oncology

## 2012-10-19 ENCOUNTER — Encounter: Payer: Self-pay | Admitting: Adult Health

## 2012-10-19 ENCOUNTER — Ambulatory Visit (HOSPITAL_BASED_OUTPATIENT_CLINIC_OR_DEPARTMENT_OTHER): Payer: 59

## 2012-10-19 ENCOUNTER — Other Ambulatory Visit (HOSPITAL_BASED_OUTPATIENT_CLINIC_OR_DEPARTMENT_OTHER): Payer: 59

## 2012-10-19 ENCOUNTER — Ambulatory Visit (HOSPITAL_BASED_OUTPATIENT_CLINIC_OR_DEPARTMENT_OTHER): Payer: 59 | Admitting: Adult Health

## 2012-10-19 ENCOUNTER — Encounter: Payer: Self-pay | Admitting: Oncology

## 2012-10-19 VITALS — BP 131/76 | HR 60 | Temp 98.7°F | Resp 20 | Ht 66.0 in | Wt 189.6 lb

## 2012-10-19 DIAGNOSIS — F329 Major depressive disorder, single episode, unspecified: Secondary | ICD-10-CM

## 2012-10-19 DIAGNOSIS — Z170 Estrogen receptor positive status [ER+]: Secondary | ICD-10-CM

## 2012-10-19 DIAGNOSIS — C50412 Malignant neoplasm of upper-outer quadrant of left female breast: Secondary | ICD-10-CM

## 2012-10-19 DIAGNOSIS — C773 Secondary and unspecified malignant neoplasm of axilla and upper limb lymph nodes: Secondary | ICD-10-CM

## 2012-10-19 DIAGNOSIS — F3289 Other specified depressive episodes: Secondary | ICD-10-CM

## 2012-10-19 DIAGNOSIS — C50419 Malignant neoplasm of upper-outer quadrant of unspecified female breast: Secondary | ICD-10-CM

## 2012-10-19 DIAGNOSIS — Z5112 Encounter for antineoplastic immunotherapy: Secondary | ICD-10-CM

## 2012-10-19 LAB — COMPREHENSIVE METABOLIC PANEL (CC13)
ALT: 7 U/L (ref 0–55)
AST: 16 U/L (ref 5–34)
Albumin: 3.1 g/dL — ABNORMAL LOW (ref 3.5–5.0)
Alkaline Phosphatase: 195 U/L — ABNORMAL HIGH (ref 40–150)
BUN: 22.9 mg/dL (ref 7.0–26.0)
CO2: 26 mEq/L (ref 22–29)
Calcium: 9.1 mg/dL (ref 8.4–10.4)
Chloride: 100 mEq/L (ref 98–107)
Creatinine: 1.4 mg/dL — ABNORMAL HIGH (ref 0.6–1.1)
Glucose: 110 mg/dl — ABNORMAL HIGH (ref 70–99)
Potassium: 3.8 mEq/L (ref 3.5–5.1)
Sodium: 137 mEq/L (ref 136–145)
Total Bilirubin: 0.44 mg/dL (ref 0.20–1.20)
Total Protein: 6.2 g/dL — ABNORMAL LOW (ref 6.4–8.3)

## 2012-10-19 LAB — CBC WITH DIFFERENTIAL/PLATELET
BASO%: 1.2 % (ref 0.0–2.0)
Basophils Absolute: 0.1 10*3/uL (ref 0.0–0.1)
EOS%: 6.2 % (ref 0.0–7.0)
Eosinophils Absolute: 0.5 10*3/uL (ref 0.0–0.5)
HCT: 31.8 % — ABNORMAL LOW (ref 34.8–46.6)
HGB: 10.5 g/dL — ABNORMAL LOW (ref 11.6–15.9)
LYMPH%: 22.4 % (ref 14.0–49.7)
MCH: 29 pg (ref 25.1–34.0)
MCHC: 33 g/dL (ref 31.5–36.0)
MCV: 87.8 fL (ref 79.5–101.0)
MONO#: 0.6 10*3/uL (ref 0.1–0.9)
MONO%: 7.6 % (ref 0.0–14.0)
NEUT#: 4.8 10*3/uL (ref 1.5–6.5)
NEUT%: 62.6 % (ref 38.4–76.8)
Platelets: 359 10*3/uL (ref 145–400)
RBC: 3.62 10*6/uL — ABNORMAL LOW (ref 3.70–5.45)
RDW: 16.1 % — ABNORMAL HIGH (ref 11.2–14.5)
WBC: 7.6 10*3/uL (ref 3.9–10.3)
lymph#: 1.7 10*3/uL (ref 0.9–3.3)
nRBC: 0 % (ref 0–0)

## 2012-10-19 MED ORDER — SODIUM CHLORIDE 0.9 % IJ SOLN
10.0000 mL | INTRAMUSCULAR | Status: DC | PRN
  Administered 2012-10-19: 10 mL
  Filled 2012-10-19: qty 10

## 2012-10-19 MED ORDER — HEPARIN SOD (PORK) LOCK FLUSH 100 UNIT/ML IV SOLN
250.0000 [IU] | Freq: Once | INTRAVENOUS | Status: AC | PRN
  Administered 2012-10-19: 250 [IU]
  Filled 2012-10-19: qty 5

## 2012-10-19 MED ORDER — TRASTUZUMAB CHEMO INJECTION 440 MG
6.0000 mg/kg | Freq: Once | INTRAVENOUS | Status: AC
  Administered 2012-10-19: 525 mg via INTRAVENOUS
  Filled 2012-10-19: qty 25

## 2012-10-19 MED ORDER — SODIUM CHLORIDE 0.9 % IV SOLN
Freq: Once | INTRAVENOUS | Status: AC
  Administered 2012-10-19: 15:00:00 via INTRAVENOUS

## 2012-10-19 MED ORDER — DIPHENHYDRAMINE HCL 25 MG PO CAPS
50.0000 mg | ORAL_CAPSULE | Freq: Once | ORAL | Status: AC
  Administered 2012-10-19: 50 mg via ORAL

## 2012-10-19 MED ORDER — ACETAMINOPHEN 325 MG PO TABS
650.0000 mg | ORAL_TABLET | Freq: Once | ORAL | Status: AC
  Administered 2012-10-19: 650 mg via ORAL

## 2012-10-19 NOTE — Patient Instructions (Addendum)
[facility] Discharge Instructions for Patients Receiving Chemotherapy  Today you received the following chemotherapy agents Herceptin.  To help prevent nausea and vomiting after your treatment, we encourage you to take your nausea medication.   If you develop nausea and vomiting that is not controlled by your nausea medication, call the clinic. If it is after clinic hours your family physician or the after hours number for the clinic or go to the Emergency Department.   BELOW ARE SYMPTOMS THAT SHOULD BE REPORTED IMMEDIATELY:  *FEVER GREATER THAN 100.5 F  *CHILLS WITH OR WITHOUT FEVER  NAUSEA AND VOMITING THAT IS NOT CONTROLLED WITH YOUR NAUSEA MEDICATION  *UNUSUAL SHORTNESS OF BREATH  *UNUSUAL BRUISING OR BLEEDING  TENDERNESS IN MOUTH AND THROAT WITH OR WITHOUT PRESENCE OF ULCERS  *URINARY PROBLEMS  *BOWEL PROBLEMS  UNUSUAL RASH Items with * indicate a potential emergency and should be followed up as soon as possible.  One of the nurses will contact you 24 hours after your treatment. Please let the nurse know about any problems that you may have experienced. Feel free to call the clinic you have any questions or concerns. The clinic phone number is [phone].   I have been informed and understand all the instructions given to me. I know to contact the clinic, my physician, or go to the Emergency Department if any problems should occur. I do not have any questions at this time, but understand that I may call the clinic during office hours   should I have any questions or need assistance in obtaining follow up care.    __________________________________________  _____________  __________ Signature of Patient or Authorized Representative            Date                   Time    __________________________________________ Nurse's Signature    

## 2012-10-19 NOTE — Patient Instructions (Signed)
Doing well.  Proceed with Herceptin.  Please call us if you have any questions or concerns.    

## 2012-10-19 NOTE — Progress Notes (Signed)
OFFICE PROGRESS NOTE  CC  Julie Ross [address] [place] [address] Dr. Cyndia Bent Dr. Richardean Chimera Dr. Mardella Layman Dr. Gustavus Messing ([facility])  DIAGNOSIS: 52 year old female who originally was seen in the multidisciplinary breast clinic for discussion of new diagnosis of left breast cancer. She is now status post left mastectomy with the final pathology revealing an invasive ductal carcinoma that is ER positive PR positive HER-2/neu positive with Ki-67 11%.  PRIOR THERAPY:  #1 patient was originally seen in the multidisciplinary breast clinic on 11/30/2011 for what at that time looked like a ductal carcinoma in situ of the left breast. Due to the extent of disease she was recommended a mastectomy with axillary lymph node sampling.  #2 12/19/2011 patient had a left mastectomy with sentinel lymph node biopsy. Her final pathology revealed 2 foci of invasive ductal carcinoma measuring 0.8 and 0.6 cm. The tumor was low grade ER positive PR positive. Patient's tumor was found to be HER-2/neu positive with HER-2 ratio of 3.27. Ki-67 was 11%. Patient had sentinel lymph node  Biopsies performed one of 4 lymph nodes were positive for metastatic disease. Patient's final pathologic staging is T1 N1 M0. Stage II  #3 patient has received adjuvant chemotherapy consisting of Taxotere carboplatinum and Herceptin for 2 cycles starting in 01/2012. This was then complicated by development of bacteremia left knee sepsis requiring revision of the knee. Port-A-Cath site infection requiring removal of the Port-A-Cath and placement of a PICC line.  #4 patient has been receiving Herceptin every 3 weeks with radiation therapy this is to continue until patient finishes up her antibiotics then we may consider her completing her 2 cycles of Taxotere and carboplatinum combination.  #5 patient has now completed her radiation therapy on 06/04/2012. Overall she  tolerated it well.  #6 patient will now begin Taxotere carboplatinum and Herceptin combination to finish out her last 2 cycles. We discussed the risks and benefits of this again. We will plan on starting the regimen on February, 14 2014.patient was begun on [facility] on 08/03/2012. With the hope of finishing of 4 cycles of TCH.  CURRENT THERAPY: Every 3 week Herceptin  INTERVAL HISTORY: Julie Ross 52 y.o. female returns for Followup visit.  She will start every three week herceptin again today.  She continues to be bored sitting at home and is experiencing anhedonia.  The social workers have reached out to her and she has yet to f/u with them.  Her daughter will be finishing school soon and she won't be home alone as much during the day.  She is c/o of some pain in her shoulders/upper chest wall. She uses a walker to walk everywhere.  She denies fevers, chills, chest pain, shortness of breath, swelling, or any other concerns.  She has stopped IV ancef and started on Keflex for the knee.  She's tolerating it well.  A 10 point ROS is otherwise negative.   MEDICAL HISTORY: Past Medical History  Diagnosis Date  . Hypertension   . Hypothyroidism   . Arthritis   . Neuromuscular disorder     MS TX WITH CYMBALTA   . Multiple sclerosis   . H/O colonoscopy   . H/O bone density study 03/2011  . Wears glasses   . Heart burn   . Depression   . Sleep apnea     MILD SLEEP APNEA, NO MACHINE  6 YRS AGO HPT [facility]   . Breast cancer  lul/ER+PR+    ALLERGIES:  has No Known Allergies.  MEDICATIONS:  Current Outpatient Prescriptions  Medication Sig Dispense Refill  . ALPRAZolam (XANAX) 0.5 MG tablet Take 1 tablet (0.5 mg total) by mouth 3 (three) times daily as needed for anxiety.  15 tablet  0  . amLODipine (NORVASC) 10 MG tablet Take 1 tablet (10 mg total) by mouth daily.  30 tablet  3  . aspirin EC 325 MG EC tablet Take 1 tablet (325 mg total) by mouth 2 (two) times daily.  30 tablet  0  .  atenolol (TENORMIN) 25 MG tablet Take 0.5 tablets (12.5 mg total) by mouth daily. For blood pressure  15 tablet  0  . baclofen (LIORESAL) 10 MG tablet 10 mg 3 (three) times daily.       . cephALEXin (KEFLEX) 500 MG capsule       . Diclofenac-Misoprostol 75-0.2 MG TBEC Take 75 mg by mouth Twice daily as needed (for inflammation). Take 1 by mouth twice daily as needed      . DULoxetine (CYMBALTA) 30 MG capsule Take 3 capsules (90 mg total) by mouth daily.  30 capsule  0  . esomeprazole (NEXIUM) 40 MG capsule Take 40 mg by mouth daily before breakfast.      . losartan-hydrochlorothiazide (HYZAAR) 100-25 MG per tablet Take 1 tablet by mouth daily.       . Multiple Vitamins-Minerals (MULTIVITAMINS THER. W/MINERALS) TABS Take 1 tablet by mouth daily.      . OxyCODONE (OXYCONTIN) 20 mg T12A Take 1 tablet (20 mg total) by mouth every 12 (twelve) hours.  30 tablet  0  . oxyCODONE-acetaminophen (PERCOCET/ROXICET) 5-325 MG per tablet Take 1-2 tablets by mouth every 6 (six) hours as needed for pain.  60 tablet  0  . Probiotic Product (PROBIOTIC DAILY PO) Take 1 capsule by mouth daily.       . prochlorperazine (COMPAZINE) 10 MG tablet Take 1 tablet (10 mg total) by mouth every 6 (six) hours as needed.  30 tablet  0  . CALCIUM-MAGNESIUM PO Take 1 tablet by mouth daily.      Marland Kitchen ceFAZolin (ANCEF) 2-3 GM-% SOLR Inject 50 mLs (2 g total) into the vein every 8 (eight) hours.  126 each  0   No current facility-administered medications for this visit.   Facility-Administered Medications Ordered in Other Visits  Medication Dose Route Frequency Provider Last Rate Last Dose  . ceFAZolin (ANCEF) IVPB 2 g/50 mL premix    PRN Lance Coon, CRNA   2 g at 08/18/12 1553  . dexamethasone (DECADRON) injection    PRN Lance Coon, CRNA   8 mg at 05/01/12 0908  . fentaNYL (SUBLIMAZE) injection    PRN Lance Coon, CRNA   100 mcg at 05/01/12 0856  . lidocaine (cardiac) 100 mg/9ml (XYLOCAINE) 20 MG/ML injection 2%    PRN  Lance Coon, CRNA   60 mg at 05/01/12 0856  . midazolam (VERSED) 5 MG/5ML injection    PRN Lance Coon, CRNA   2 mg at 05/01/12 0856  . propofol (DIPRIVAN) 10 mg/mL bolus/IV push    PRN Lance Coon, CRNA   160 mg at 05/01/12 0900  . sodium chloride 0.9 % injection 10 mL  10 mL Intracatheter PRN Augustin Schooling, NP   10 mL at 10/19/12 1529    SURGICAL HISTORY:  Past Surgical History  Procedure Laterality Date  . Tonsillectomy    . Tumor removal      FROM PELVIS AGE  11  . Cesarean section      X2   . No past surgeries      BLADDER SLING  4-5 YRS AGO   . Knee arthroscopy      LT KNEE 04/2011  . Total knee arthroplasty  08/15/2011    Procedure: TOTAL KNEE ARTHROPLASTY; lft Surgeon: Harvie Junior, MD;  Location: [facility] OR;  Service: Orthopedics;  Laterality: Left;  RIGHT KNEE CORTIZONE INJECTION  . Mohs surgery      right face  . Knee arthroscopy  82    rt  . Joint replacement  Feb 2013  . Mastectomy w/ sentinel node biopsy  12/19/2011    Procedure: MASTECTOMY WITH SENTINEL LYMPH NODE BIOPSY;  Surgeon: Currie Paris, MD;  Location: [facility] OR;  Service: General;  Laterality: Left;  left breast and left axilla   . Portacath placement  01/16/2012    Procedure: INSERTION PORT-A-CATH;  Surgeon: Currie Paris, MD;  Location: [facility];  Service: General;  Laterality: Right;  Porta Cath Placement   . I&d knee with poly exchange  03/02/2012    Procedure: IRRIGATION AND DEBRIDEMENT KNEE WITH POLY EXCHANGE;  Surgeon: Harvie Junior, MD;  Location: [facility] OR;  Service: Orthopedics;  Laterality: Left;  I&D of total knee with Possible Poly Exchange   . Tee without cardioversion  03/06/2012    Procedure: TRANSESOPHAGEAL ECHOCARDIOGRAM (TEE);  Surgeon: Wendall Stade, MD;  Location[facility];  Service: Cardiovascular;  Laterality: N/A;  Rm. [id number]  . Port-a-cath removal  03/06/2012    Procedure: REMOVAL PORT-A-CATH;  Surgeon: Adolph Pollack, MD;  Location: [facility];  Service:  General;  Laterality: N/A;  . Portacath placement  05/01/2012    Procedure: INSERTION PORT-A-CATH;  Surgeon: Currie Paris, MD;  Location: [facility];  Service: General;  Laterality: Right;  right internal jugular port-a-cath insertion  . Total knee arthroplasty Left 08/18/2012    Procedure:  Irrigation and debridement of LEFT total knee;  Removal of total knee parts; Implant of spacers;  Surgeon: Nestor Lewandowsky, MD;  Location: [facility] OR;  Service: Orthopedics;  Laterality: Left;  . Port-a-cath removal Right 08/22/2012    Procedure: REMOVAL PORT-A-CATH;  Surgeon: Mariella Saa, MD;  Location: [facility] OR;  Service: General;  Laterality: Right;    REVIEW OF SYSTEMS:   General: fatigue (+), night sweats (-), fever (-), pain (+) Lymph: palpable nodes (-) HEENT: vision changes (-), mucositis (-), gum bleeding (-), epistaxis (-) Cardiovascular: chest pain (-), palpitations (-) Pulmonary: shortness of breath (-), dyspnea on exertion (-), cough (-), hemoptysis (-) GI:  Early satiety (-), melena (-), dysphagia (-), nausea/vomiting (-), diarrhea (-) GU: dysuria (-), hematuria (-), incontinence (-) Musculoskeletal: joint swelling (-), joint pain (-), back pain (-) Neuro: weakness (-), numbness (-), headache (-), confusion (-) Skin: Rash (-), lesions (-), dryness (-) Psych: depression (-), suicidal/homicidal ideation (-), feeling of hopelessness (-)   PHYSICAL EXAMINATION:  BP 131/76  Pulse 60  Temp(Src) 98.7 F (37.1 C) (Oral)  Resp 20  Ht 5\' 6"  (1.676 m)  Wt 189 lb 9.6 oz (86.002 kg)  BMI 30.62 kg/m2 General: Patient is a well appearing female in no acute distress HEENT: PERRLA, sclerae anicteric no conjunctival pallor, MMM Neck: supple, no palpable adenopathy Lungs: clear to auscultation bilaterally, no wheezes, rhonchi, or rales Cardiovascular: regular rate rhythm, S1, S2, no murmurs, rubs or gallops Abdomen: Soft, non-tender, non-distended, normoactive bowel sounds, no  HSM Extremities: warm  and well perfused, no clubbing, cyanosis, or edema Skin: No rashes or lesions Neuro: Non-focal Breasts: left chest wall mastectomy site no nodularity, erythematous from radiation.   Tenderness in costochondral muscles in 1 and 2nd ICS, additionally underneath clavicle and on clavicles bilaterally.  ECOG PERFORMANCE STATUS: 1 - Symptomatic but completely ambulatory  LABORATORY DATA: Lab Results  Component Value Date   WBC 7.6 10/19/2012   HGB 10.5* 10/19/2012   HCT 31.8* 10/19/2012   MCV 87.8 10/19/2012   PLT 359 10/19/2012      Chemistry      Component Value Date/Time   NA 137 10/19/2012 1251   NA 138 09/01/2012 1420   K 3.8 10/19/2012 1251   K 3.7 09/01/2012 1420   CL 100 10/19/2012 1251   CL 99 09/01/2012 1420   CO2 26 10/19/2012 1251   CO2 28 09/01/2012 1420   BUN 22.9 10/19/2012 1251   BUN 17 09/01/2012 1420   CREATININE 1.4* 10/19/2012 1251   CREATININE 1.03 09/01/2012 1420      Component Value Date/Time   CALCIUM 9.1 10/19/2012 1251   CALCIUM 9.3 09/01/2012 1420   ALKPHOS 195* 10/19/2012 1251   ALKPHOS 400* 09/01/2012 1420   AST 16 10/19/2012 1251   AST 28 09/01/2012 1420   ALT 7 10/19/2012 1251   ALT <5 09/01/2012 1420   BILITOT 0.44 10/19/2012 1251   BILITOT 0.4 09/01/2012 1420     DOB: June 06, 1961 Age: 23 Gender: F Client Name [place] Collected Date: 12/19/2011 Received Date: 12/19/2011 Physician: Cyndia Bent Chart #: MRN # : [id number] Physician cc: Richardean Chimera, MD Sherian Rein, MD Race:W Visit #: [id number] Kaylyn Lim, RN REPORT OF SURGICAL PATHOLOGY ADDITIONAL INFORMATION: 5. PROGNOSTIC INDICATORS - ACIS Results IMMUNOHISTOCHEMICAL AND MORPHOMETRIC ANALYSIS BY THE AUTOMATED CELLULAR IMAGING SYSTEM (ACIS) Estrogen Receptor (Negative, <1%): 100%, STRONG STAINING INTENSITY Progesterone Receptor (Negative, <1%): 100%, STRONG STAINING INTENSITY Proliferation Marker Ki67 by M IB-1 (Low<20%): 11% All controls stained appropriately Pecola Leisure  MD Pathologist, Electronic Signature ( Signed 12/29/2011) 5. CHROMOGENIC IN-SITU HYBRIDIZATION Interpretation: HER2/NEU BY CISH - SHOWS AMPLIFICATION BY CISH ANALYSIS. THE RATIO OF HER2: CEP 17 SIGNALS WAS 3.27 Reference range: Ratio: HER2:CEP17 < 1.8 gene amplification not observed Ratio: HER2:CEP 17 1.8-2.2 - equivocal result Ratio: HER2:CEP17 > 2.2 - gene amplification observed Pecola Leisure MD Pathologist, Electronic Signature ( Signed 12/27/2011) 1 of 4 FINAL for Ross, Julie M ([id number]) FINAL DIAGNOSIS Diagnosis 1. Lymph node, sentinel, biopsy, Left axillary - ONE BENIGN LYMPH NODE WITH NO TUMOR SEEN (0/1). 2. Lymph node, sentinel, biopsy, Left axilla - ONE LYMPH NODE POSITIVE FOR METASTATIC DUCTAL CARCINOMA (1/1). - SEE COMMENT. 3. Lymph node, sentinel, biopsy, Left axilla - ONE BENIGN LYMPH NODE WITH NO TUMOR SEEN (0/1). 4. Lymph node, sentinel, biopsy, Left axilla - ONE BENIGN LYMPH NODE WITH NO TUMOR SEEN (0/1). 5. Breast, simple mastectomy, Left - INVASIVE GRADE I DUCTAL CARCINOMA, TWO FOCI MEASURING 0.8 CM AND 0.6 CM ARISING IN A BACKGROUND OF EXTENSIVE INTERMEDIATE GRADE DUCTAL CARCINOMA IN SITU WITH NECROSIS. - MARGINS ARE NEGATIVE. - SEE ONCOLOGY TEMPLATE. Microscopic Comment 2. On re-review of the frozen section slides, no metastatic carcinoma is identified. The carcinoma present is on permanent sections only. 5. BREAST, INVASIVE TUMOR, WITH LYMPH NODE SAMPLING Specimen, including laterality: Left breast with sentinel lymph node. Procedure: Left simple mastectomy with sentinel lymph node biopsy. Grade: I (Both foci of tumors show similar morphology). Tubule formation: 2. Nuclear pleomorphism: 2. Mitotic: 1. Tumor size (glass  slide measurement): Two foci measuring 0.8 cm and 0.6 cm. Margins: Invasive, distance to closest margin: At least 1.5 cm. In-situ, distance to closest margin: At least 1.5 cm. Lymphovascular invasion: No definite lymph/vascular  invasion is not identified however a sentinel lymph node is positive (see below). Ductal carcinoma in situ: Yes. Grade: Intermediate grade. Extensive intraductal component: Yes. Lobular neoplasia: Not identified. Tumor focality: Two foci. Treatment effect: Not applicable. Extent of tumor: Tumor confined to breast parenchyma. Lymph nodes: # examined: 4. Lymph nodes with metastasis: 1. Macrometastasis: (> 2.0 mm): 1. Extracapsular extension: Not identified. Breast prognostic profile: A breast prognostic profile will be performed (see below comment). Additional breast findings: Fibrocystic changes with usual ductal hyperplasia. TNM: mpT1b, pN1a, MX. Comment: The palpable nodular area seen grossly is comprised predominantly of intermediate grade ductal carcinoma in situ with only two foci of invasive ductal carcinoma. Both invasive foci are morphologically similar. As such a breast prognostic profile will only be performed on the larger tumor. A breast prognostic profile can be performed upon the smaller tumor per request. (RH:kh 12-21-11) 2 of 4   ASSESSMENT: 52 year old female with  #1 stage II invasive ductal carcinoma of the left breast with ductal carcinoma in situ. Patient is status post mastectomy that showed 2 foci of invasive cancer measuring 0.8 0.6 cm. The tumor is ER positive PR positive HER-2/neu amplified at 3.27. Patient is now seen in medical oncology for discussion of adjuvant treatment. As mentioned above her case was discussed at the multidisciplinary breast conference. Patient is noted to have one of 4 lymph nodes positive for metastatic disease. This was only axillary sentinel node biopsy. A discussion of whether patient under should undergo a full axillary lymph node dissection did take place but at the end of our extensive discussion it was recommended the patient will proceed with radiation and DOS she would feel the criteria of seed 11. Therefore is excellent lymph node  dissection is not being done. Instead the patient will proceed with adjuvant chemotherapy and Herceptin. Her chemotherapy will consist of Taxotere carboplatinum given every 21 days. For the duration of her chemotherapy patient will get Herceptin on a weekly basis. Once she completes the chemotherapy she then will get Herceptin every 3 weeks. After completion of chemotherapy patient will proceed with radiation therapy with concurrent Herceptin. After completion of radiation she will start Herceptin and antiestrogen therapy consisting of either tamoxifen or an aromatase inhibitor.  #2 patient has now completed radiation therapy and Herceptin combination. Her radiation completed on 06/04/2012.  #3 we will reinitiate combination chemotherapy and Herceptin on 08/03/2012. The chemotherapy will consist of Taxotere or and carboplatinum with Herceptin. The Taxotere carboplatinum will be given every 21 days to complete out 2 more cycles. Herceptin will be given on a weekly basis.  PLAN:  #1 Julie Ross is doing well.   She will proceed with Herceptin.  We spent a lot of the appointment discussing resources for her. I spoke with the CSW Lauren today and she will go by and see her in the infusion room.  Should she remain depressed at the next visit I will refer her to psych for medication management.   #2 Patient will continue to see her orthopedic surgeon, Dr. Luiz Blare for her right knee.  #3 she will now resume every 3 week Herceptin until August 2013.  She will return in 3 weeks time.    #4 She likely has muscle strain due to her use of the walker.  She will  try advil and see if it helps.  If not, we will re-evaluate and order imaging.      All questions were answered. The patient knows to call the clinic with any problems, questions or concerns. We can certainly see the patient much sooner if necessary.  I spent 25 minutes counseling the patient face to face. The total time spent in the appointment was 30  minutes.  Cherie Ouch Lyn Hollingshead, NP Medical Oncology [facility] Phone: [phone] 10/19/2012, 5:55 PM

## 2012-10-30 ENCOUNTER — Ambulatory Visit (INDEPENDENT_AMBULATORY_CARE_PROVIDER_SITE_OTHER): Payer: 59 | Admitting: Infectious Disease

## 2012-10-30 ENCOUNTER — Encounter: Payer: Self-pay | Admitting: Infectious Disease

## 2012-10-30 VITALS — BP 166/94 | HR 60 | Temp 97.9°F | Ht 66.0 in | Wt 189.0 lb

## 2012-10-30 DIAGNOSIS — A4901 Methicillin susceptible Staphylococcus aureus infection, unspecified site: Secondary | ICD-10-CM

## 2012-10-30 DIAGNOSIS — R45851 Suicidal ideations: Secondary | ICD-10-CM

## 2012-10-30 DIAGNOSIS — F32A Depression, unspecified: Secondary | ICD-10-CM

## 2012-10-30 DIAGNOSIS — T8454XD Infection and inflammatory reaction due to internal left knee prosthesis, subsequent encounter: Secondary | ICD-10-CM

## 2012-10-30 DIAGNOSIS — C50919 Malignant neoplasm of unspecified site of unspecified female breast: Secondary | ICD-10-CM

## 2012-10-30 DIAGNOSIS — C773 Secondary and unspecified malignant neoplasm of axilla and upper limb lymph nodes: Secondary | ICD-10-CM

## 2012-10-30 DIAGNOSIS — F329 Major depressive disorder, single episode, unspecified: Secondary | ICD-10-CM

## 2012-10-30 DIAGNOSIS — R7881 Bacteremia: Secondary | ICD-10-CM

## 2012-10-30 DIAGNOSIS — Z5189 Encounter for other specified aftercare: Secondary | ICD-10-CM

## 2012-10-30 LAB — SEDIMENTATION RATE: Sed Rate: 21 mm/hr (ref 0–22)

## 2012-10-30 NOTE — Progress Notes (Signed)
Subjective:    Patient ID: Julie Ross, female    DOB: 10-07-1960, 52 y.o.   MRN: [id number]  HPI  52 year old lady with breast cancer with left prosthetic knee with bacteremia in September 2013 and at the time had initial PJI and underwent polyexchange and port-a-cath removal. She completed 6 weeks of antibiotics IV then had been on Keflex. After resumption of chemotherapy she had recurrence of knee swelling and found once again to have PJI with MSSA, now s/p 2 stage revision on March 1st, 2014 . She is now  Sp  6 week course of high dose IV ancef. Knee pain   Was MUCHbetter than before when simply touching kneee caused severe pain. She has been changed to oral keflex 1 g bid.  She complains today that her knee still continues to ache at night in particular but not worsens.   Sheis VERY VERY dysphoric and tearful , scared of her   Infection recurring, her cancer recurring. She is not on SSRI and NOT in therapy.  She has had active suicidal ideation  with thoughts of taking pills but not today. I offered her referral to [facility] for the 2/2 times I have seen her in the clinic. She again refused but contracted for safety. I am trying to find one of our counselors (for HIV pts) to see if he can talk to pt while here but she clearly needs to be engaged in care for [facility].  I spent greater than 45 minutes with the patient including greater than 50% of time in face to face counsel of the patient and in coordination of their care.     Review of Systems  Constitutional: Negative for fever, chills, diaphoresis, activity change, appetite change, fatigue and unexpected weight change.  HENT: Negative for congestion, sore throat, rhinorrhea, sneezing, trouble swallowing and sinus pressure.   Eyes: Negative for photophobia and visual disturbance.  Respiratory: Negative for cough, chest tightness, shortness of breath, wheezing and stridor.   Cardiovascular: Negative for chest pain, palpitations and leg swelling.   Gastrointestinal: Negative for nausea, vomiting, abdominal pain, diarrhea, constipation, blood in stool, abdominal distention and anal bleeding.  Genitourinary: Negative for dysuria, hematuria, flank pain and difficulty urinating.  Musculoskeletal: Positive for joint swelling and arthralgias. Negative for myalgias, back pain and gait problem.  Skin: Negative for color change, pallor, rash and wound.  Neurological: Negative for dizziness, tremors, weakness and light-headedness.  Hematological: Negative for adenopathy. Does not bruise/bleed easily.  Psychiatric/Behavioral: Positive for suicidal ideas, sleep disturbance, dysphoric mood and decreased concentration. Negative for behavioral problems, confusion, self-injury and agitation. The patient is nervous/anxious.        Objective:   Physical Exam  Constitutional: She is oriented to person, place, and time. She appears well-developed and well-nourished. No distress.  HENT:  Head: Normocephalic and atraumatic.  Eyes: Conjunctivae and EOM are normal. Pupils are equal, round, and reactive to light. No scleral icterus.  Neck: Normal range of motion. Neck supple. No JVD present.  Cardiovascular: Normal rate and regular rhythm.   Pulmonary/Chest: Effort normal. No respiratory distress.  Abdominal: Soft. She exhibits no distension.  Musculoskeletal: She exhibits no edema and no tenderness.       Legs: Neurological: She is alert and oriented to person, place, and time. She has normal reflexes. She exhibits normal muscle tone. Coordination normal.  Skin: Skin is warm and dry. She is not diaphoretic. No erythema. No pallor.     Psychiatric: Her behavior is normal.  Judgment and thought content normal. Her speech is delayed. She exhibits a depressed mood. She expresses no suicidal ideation.          Assessment & Plan:  Prosthetic jont infection with MSSA:  --will continue  keflex 1g bid for now, recheck esr, crp today  MSSA bacteremia: In  September and associated with [facility] cath --TEE was negative and pt received 6 weeks of abx   Breast Cancer: followed closely by Dr Welton Flakes  Depression: she refused referral to [facility] AGAIN. I will cc to Dr. Welton Flakes pt needs MH counseling for depression with suicidal ideation at times. CUrrently contractec for safety

## 2012-10-31 ENCOUNTER — Telehealth: Payer: Self-pay | Admitting: Emergency Medicine

## 2012-10-31 LAB — C-REACTIVE PROTEIN: CRP: <0.5 mg/dL (ref <0.60)

## 2012-10-31 NOTE — Telephone Encounter (Signed)
Per Ramiro Harvest with [facility], patient has a extension line connected to her PICC which they have been unsuccessful at changing.   Patient states that the nurse at Dr Clinton Gallant office also tried to detach the extension and was unsuccessful.  Instructed patient to bring a new extension with her when she comes to [facility] on 5/23 and the nurses here can try to change it then.   Patient verbalized understanding.

## 2012-11-05 ENCOUNTER — Ambulatory Visit (HOSPITAL_BASED_OUTPATIENT_CLINIC_OR_DEPARTMENT_OTHER): Payer: 59

## 2012-11-05 VITALS — BP 177/101 | HR 59 | Temp 96.7°F

## 2012-11-05 DIAGNOSIS — Z452 Encounter for adjustment and management of vascular access device: Secondary | ICD-10-CM

## 2012-11-05 DIAGNOSIS — C50419 Malignant neoplasm of upper-outer quadrant of unspecified female breast: Secondary | ICD-10-CM

## 2012-11-05 DIAGNOSIS — D0592 Unspecified type of carcinoma in situ of left breast: Secondary | ICD-10-CM

## 2012-11-05 DIAGNOSIS — C773 Secondary and unspecified malignant neoplasm of axilla and upper limb lymph nodes: Secondary | ICD-10-CM

## 2012-11-05 MED ORDER — HEPARIN SOD (PORK) LOCK FLUSH 100 UNIT/ML IV SOLN
500.0000 [IU] | Freq: Once | INTRAVENOUS | Status: DC
  Filled 2012-11-05: qty 5

## 2012-11-05 MED ORDER — SODIUM CHLORIDE 0.9 % IJ SOLN
10.0000 mL | INTRAMUSCULAR | Status: DC | PRN
  Administered 2012-11-05: 10 mL via INTRAVENOUS
  Filled 2012-11-05: qty 10

## 2012-11-05 MED ORDER — HEPARIN SOD (PORK) LOCK FLUSH 100 UNIT/ML IV SOLN
500.0000 [IU] | Freq: Once | INTRAVENOUS | Status: AC
  Administered 2012-11-05: 500 [IU] via INTRAVENOUS
  Filled 2012-11-05: qty 5

## 2012-11-05 MED ORDER — SODIUM CHLORIDE 0.9 % IJ SOLN
10.0000 mL | INTRAMUSCULAR | Status: DC | PRN
  Filled 2012-11-05: qty 10

## 2012-11-05 NOTE — Patient Instructions (Signed)
Call MD for problems or concerns 

## 2012-11-06 ENCOUNTER — Other Ambulatory Visit: Payer: Self-pay | Admitting: Oncology

## 2012-11-06 DIAGNOSIS — Z9012 Acquired absence of left breast and nipple: Secondary | ICD-10-CM

## 2012-11-06 DIAGNOSIS — Z1231 Encounter for screening mammogram for malignant neoplasm of breast: Secondary | ICD-10-CM

## 2012-11-08 ENCOUNTER — Other Ambulatory Visit: Payer: Self-pay | Admitting: Emergency Medicine

## 2012-11-08 NOTE — Telephone Encounter (Signed)
Per Julie Ross pt is aware and will get her schedule today...td

## 2012-11-09 ENCOUNTER — Other Ambulatory Visit (HOSPITAL_BASED_OUTPATIENT_CLINIC_OR_DEPARTMENT_OTHER): Payer: 59 | Admitting: Lab

## 2012-11-09 ENCOUNTER — Encounter: Payer: Self-pay | Admitting: *Deleted

## 2012-11-09 ENCOUNTER — Encounter: Payer: Self-pay | Admitting: Adult Health

## 2012-11-09 ENCOUNTER — Ambulatory Visit (HOSPITAL_BASED_OUTPATIENT_CLINIC_OR_DEPARTMENT_OTHER): Payer: 59 | Admitting: Adult Health

## 2012-11-09 ENCOUNTER — Ambulatory Visit (HOSPITAL_BASED_OUTPATIENT_CLINIC_OR_DEPARTMENT_OTHER): Payer: 59

## 2012-11-09 VITALS — BP 173/93 | HR 63 | Temp 98.0°F | Resp 20 | Ht 66.0 in | Wt 192.4 lb

## 2012-11-09 DIAGNOSIS — C50419 Malignant neoplasm of upper-outer quadrant of unspecified female breast: Secondary | ICD-10-CM

## 2012-11-09 DIAGNOSIS — C50412 Malignant neoplasm of upper-outer quadrant of left female breast: Secondary | ICD-10-CM

## 2012-11-09 DIAGNOSIS — C773 Secondary and unspecified malignant neoplasm of axilla and upper limb lymph nodes: Secondary | ICD-10-CM

## 2012-11-09 DIAGNOSIS — F329 Major depressive disorder, single episode, unspecified: Secondary | ICD-10-CM

## 2012-11-09 DIAGNOSIS — M898X9 Other specified disorders of bone, unspecified site: Secondary | ICD-10-CM

## 2012-11-09 DIAGNOSIS — F3289 Other specified depressive episodes: Secondary | ICD-10-CM

## 2012-11-09 DIAGNOSIS — R079 Chest pain, unspecified: Secondary | ICD-10-CM

## 2012-11-09 DIAGNOSIS — Z5112 Encounter for antineoplastic immunotherapy: Secondary | ICD-10-CM

## 2012-11-09 LAB — COMPREHENSIVE METABOLIC PANEL (CC13)
ALT: 22 U/L (ref 0–55)
AST: 20 U/L (ref 5–34)
Albumin: 3.4 g/dL — ABNORMAL LOW (ref 3.5–5.0)
Alkaline Phosphatase: 185 U/L — ABNORMAL HIGH (ref 40–150)
BUN: 19.2 mg/dL (ref 7.0–26.0)
CO2: 28 mEq/L (ref 22–29)
Calcium: 9.2 mg/dL (ref 8.4–10.4)
Chloride: 102 mEq/L (ref 98–107)
Creatinine: 1.2 mg/dL — ABNORMAL HIGH (ref 0.6–1.1)
Glucose: 91 mg/dl (ref 70–99)
Potassium: 3.9 mEq/L (ref 3.5–5.1)
Sodium: 138 mEq/L (ref 136–145)
Total Bilirubin: 0.48 mg/dL (ref 0.20–1.20)
Total Protein: 6.4 g/dL (ref 6.4–8.3)

## 2012-11-09 LAB — CBC WITH DIFFERENTIAL/PLATELET
BASO%: 1 % (ref 0.0–2.0)
Basophils Absolute: 0.1 10*3/uL (ref 0.0–0.1)
EOS%: 16.8 % — ABNORMAL HIGH (ref 0.0–7.0)
Eosinophils Absolute: 1.2 10*3/uL — ABNORMAL HIGH (ref 0.0–0.5)
HCT: 32.7 % — ABNORMAL LOW (ref 34.8–46.6)
HGB: 10.7 g/dL — ABNORMAL LOW (ref 11.6–15.9)
LYMPH%: 21.6 % (ref 14.0–49.7)
MCH: 29.3 pg (ref 25.1–34.0)
MCHC: 32.7 g/dL (ref 31.5–36.0)
MCV: 89.6 fL (ref 79.5–101.0)
MONO#: 0.4 10*3/uL (ref 0.1–0.9)
MONO%: 4.8 % (ref 0.0–14.0)
NEUT#: 4.1 10*3/uL (ref 1.5–6.5)
NEUT%: 55.8 % (ref 38.4–76.8)
Platelets: 244 10*3/uL (ref 145–400)
RBC: 3.65 10*6/uL — ABNORMAL LOW (ref 3.70–5.45)
RDW: 14.1 % (ref 11.2–14.5)
WBC: 7.3 10*3/uL (ref 3.9–10.3)
lymph#: 1.6 10*3/uL (ref 0.9–3.3)

## 2012-11-09 MED ORDER — SODIUM CHLORIDE 0.9 % IJ SOLN
10.0000 mL | INTRAMUSCULAR | Status: DC | PRN
Start: 2012-11-09 — End: 2012-11-09
  Administered 2012-11-09: 10 mL
  Filled 2012-11-09: qty 10

## 2012-11-09 MED ORDER — SODIUM CHLORIDE 0.9 % IV SOLN
Freq: Once | INTRAVENOUS | Status: AC
  Administered 2012-11-09: 13:00:00 via INTRAVENOUS

## 2012-11-09 MED ORDER — HEPARIN SOD (PORK) LOCK FLUSH 100 UNIT/ML IV SOLN
250.0000 [IU] | Freq: Once | INTRAVENOUS | Status: AC | PRN
  Administered 2012-11-09: 250 [IU]
  Filled 2012-11-09: qty 5

## 2012-11-09 MED ORDER — DIPHENHYDRAMINE HCL 25 MG PO CAPS
50.0000 mg | ORAL_CAPSULE | Freq: Once | ORAL | Status: AC
  Administered 2012-11-09: 50 mg via ORAL

## 2012-11-09 MED ORDER — ACETAMINOPHEN 325 MG PO TABS
650.0000 mg | ORAL_TABLET | Freq: Once | ORAL | Status: AC
  Administered 2012-11-09: 650 mg via ORAL

## 2012-11-09 MED ORDER — TRASTUZUMAB CHEMO INJECTION 440 MG
6.0000 mg/kg | Freq: Once | INTRAVENOUS | Status: AC
  Administered 2012-11-09: 525 mg via INTRAVENOUS
  Filled 2012-11-09: qty 25

## 2012-11-09 NOTE — Patient Instructions (Addendum)
We will have social work come and evaluate you in the treatment room and we will get you in with a psychiatrist.   Proceed with herceptin.  Please call us if you have any questions or concerns.

## 2012-11-09 NOTE — Progress Notes (Signed)
Clinical Social Worker met with pt in the infusion room to address needs and concerns.  Pt presented tearful and expressed feeling depressed and overwhelmed.  Pt continue to struggle with her emotions associated with her knee replacement issues, loss of mobility and independence.  Pt stated she no longer has interest in the activities she once enjoyed, and finds herself "thinking the worst".  CSW validated pt's feelings and discussed possible interventions and coping techniques.  CSW and pt again discussed the support services at [facility].  Pt is scheduled to meet with the Doctoral Counseling intern on Friday 5/30.  Pt as also been referred to a psychiatrist for med management.  CSw provided pt with contact information and encouraged her to call with any needs or concerns.  Pt verbalized understanding and was agreeable to [facility] support services.    Tamala Julian, MSW, LCSW Clinical Social Worker [facility] [phone]

## 2012-11-09 NOTE — Patient Instructions (Addendum)
[facility] Discharge Instructions for Patients Receiving Chemotherapy  Today you received the following chemotherapy agents : Herceptin.  To help prevent nausea and vomiting after your treatment, we encourage you to take your nausea medication as instructed by your physician.    If you develop nausea and vomiting that is not controlled by your nausea medication, call the clinic. If it is after clinic hours your family physician or the after hours number for the clinic or go to the Emergency Department.   BELOW ARE SYMPTOMS THAT SHOULD BE REPORTED IMMEDIATELY:  *FEVER GREATER THAN 100.5 F  *CHILLS WITH OR WITHOUT FEVER  NAUSEA AND VOMITING THAT IS NOT CONTROLLED WITH YOUR NAUSEA MEDICATION  *UNUSUAL SHORTNESS OF BREATH  *UNUSUAL BRUISING OR BLEEDING  TENDERNESS IN MOUTH AND THROAT WITH OR WITHOUT PRESENCE OF ULCERS  *URINARY PROBLEMS  *BOWEL PROBLEMS  UNUSUAL RASH Items with * indicate a potential emergency and should be followed up as soon as possible.  One of the nurses will contact you 24 hours after your treatment. Please let the nurse know about any problems that you may have experienced. Feel free to call the clinic you have any questions or concerns. The clinic phone number is [phone].   I have been informed and understand all the instructions given to me. I know to contact the clinic, my physician, or go to the Emergency Department if any problems should occur. I do not have any questions at this time, but understand that I may call the clinic during office hours   should I have any questions or need assistance in obtaining follow up care.    __________________________________________  _____________  __________ Signature of Patient or Authorized Representative            Date                   Time    __________________________________________ Nurse's Signature

## 2012-11-09 NOTE — Progress Notes (Signed)
OFFICE PROGRESS NOTE  CC  [name] [address] [place] [address] Dr. Cyndia Bent Dr. Richardean Chimera Dr. Mardella Layman Dr. Gustavus Messing ([facility])  DIAGNOSIS: 52 year old female who originally was seen in the multidisciplinary breast clinic for discussion of new diagnosis of left breast cancer. She is now status post left mastectomy with the final pathology revealing an invasive ductal carcinoma that is ER positive PR positive HER-2/neu positive with Ki-67 11%.  PRIOR THERAPY:  #1 patient was originally seen in the multidisciplinary breast clinic on 11/30/2011 for what at that time looked like a ductal carcinoma in situ of the left breast. Due to the extent of disease she was recommended a mastectomy with axillary lymph node sampling.  #2 12/19/2011 patient had a left mastectomy with sentinel lymph node biopsy. Her final pathology revealed 2 foci of invasive ductal carcinoma measuring 0.8 and 0.6 cm. The tumor was low grade ER positive PR positive. Patient's tumor was found to be HER-2/neu positive with HER-2 ratio of 3.27. Ki-67 was 11%. Patient had sentinel lymph node  Biopsies performed one of 4 lymph nodes were positive for metastatic disease. Patient's final pathologic staging is T1 N1 M0. Stage II  #3 patient has received adjuvant chemotherapy consisting of Taxotere carboplatinum and Herceptin for 2 cycles starting in 01/2012. This was then complicated by development of bacteremia left knee sepsis requiring revision of the knee. Port-A-Cath site infection requiring removal of the Port-A-Cath and placement of a PICC line.  #4 patient then received Herceptin every 3 weeks with radiation therapy  From 10/ 29 through 06/04/12 while she finished up her antibiotics.  #5 patient completed Taxotere carboplatinum and Herceptin combination to finish out her last 2 cycles. We discussed the risks and benefits of this again. This was started on  08/03/12, and she finished two cycles.  The second cycle was delayed due to another incidence of left knee septic arthritis.  Her hardware was removed, she was placed on IV antibiotics, and was cleared by infectious disease to finish her last cycle of [facility] which she did on 09/21/12.    #7 She resumed every three week Herceptin on 10/19/12.   CURRENT THERAPY: Every 3 week Herceptin  INTERVAL HISTORY: Jeannine Pennisi Depaz 52 y.o. female returns for Followup visit.  She is very tearful.  I asked if her depression and stress was any better, and she said no.  She became tearful, and told me she had thoughts of suicidal ideation during the times she's at home alone.  She last thought about it last week.  She is ready to talk to a counselor and go to psychiatry for medication management.  She does continue to have pain in her clavicle, and 1st and second rib, that was unrelieved with Tylenol and Advil. It is a mild constant pain, she denies palpitations, shortness of breath, pleurisy.  Also, she denies fevers, chills, n/v, constipation, diarrhea, numbness, pain (other than in leg), shortness of breath, dyspnea on exertion or any other concerns.    MEDICAL HISTORY: Past Medical History  Diagnosis Date  . Hypertension   . Hypothyroidism   . Arthritis   . Neuromuscular disorder     MS TX WITH CYMBALTA   . Multiple sclerosis   . H/O colonoscopy   . H/O bone density study 03/2011  . Wears glasses   . Heart burn   . Depression   . Sleep apnea     MILD SLEEP APNEA, NO  MACHINE  6 YRS AGO HPT [facility]   . Breast cancer     lul/ER+PR+    ALLERGIES:  has No Known Allergies.  MEDICATIONS:  Current Outpatient Prescriptions  Medication Sig Dispense Refill  . ALPRAZolam (XANAX) 0.5 MG tablet Take 1 tablet (0.5 mg total) by mouth 3 (three) times daily as needed for anxiety.  15 tablet  0  . amLODipine (NORVASC) 10 MG tablet Take 1 tablet (10 mg total) by mouth daily.  30 tablet  3  . aspirin EC 325 MG EC tablet Take  1 tablet (325 mg total) by mouth 2 (two) times daily.  30 tablet  0  . atenolol (TENORMIN) 25 MG tablet Take 0.5 tablets (12.5 mg total) by mouth daily. For blood pressure  15 tablet  0  . baclofen (LIORESAL) 10 MG tablet 10 mg 3 (three) times daily.       Marland Kitchen CALCIUM-MAGNESIUM PO Take 1 tablet by mouth daily.      Marland Kitchen ceFAZolin (ANCEF) 2-3 GM-% SOLR Inject 50 mLs (2 g total) into the vein every 8 (eight) hours.  126 each  0  . cephALEXin (KEFLEX) 500 MG capsule       . Diclofenac-Misoprostol 75-0.2 MG TBEC Take 75 mg by mouth Twice daily as needed (for inflammation). Take 1 by mouth twice daily as needed      . DULoxetine (CYMBALTA) 30 MG capsule Take 3 capsules (90 mg total) by mouth daily.  30 capsule  0  . esomeprazole (NEXIUM) 40 MG capsule Take 40 mg by mouth daily before breakfast.      . losartan-hydrochlorothiazide (HYZAAR) 100-25 MG per tablet Take 1 tablet by mouth daily.       . Multiple Vitamins-Minerals (MULTIVITAMINS THER. W/MINERALS) TABS Take 1 tablet by mouth daily.      . OxyCODONE (OXYCONTIN) 20 mg T12A Take 1 tablet (20 mg total) by mouth every 12 (twelve) hours.  30 tablet  0  . oxyCODONE-acetaminophen (PERCOCET/ROXICET) 5-325 MG per tablet Take 1-2 tablets by mouth every 6 (six) hours as needed for pain.  60 tablet  0  . Probiotic Product (PROBIOTIC DAILY PO) Take 1 capsule by mouth daily.       . prochlorperazine (COMPAZINE) 10 MG tablet Take 1 tablet (10 mg total) by mouth every 6 (six) hours as needed.  30 tablet  0   No current facility-administered medications for this visit.   Facility-Administered Medications Ordered in Other Visits  Medication Dose Route Frequency Provider Last Rate Last Dose  . ceFAZolin (ANCEF) IVPB 2 g/50 mL premix    PRN Lance Coon, CRNA   2 g at 08/18/12 1553  . dexamethasone (DECADRON) injection    PRN Lance Coon, CRNA   8 mg at 05/01/12 0908  . fentaNYL (SUBLIMAZE) injection    PRN Lance Coon, CRNA   100 mcg at 05/01/12 0856  .  lidocaine (cardiac) 100 mg/30ml (XYLOCAINE) 20 MG/ML injection 2%    PRN Lance Coon, CRNA   60 mg at 05/01/12 0856  . midazolam (VERSED) 5 MG/5ML injection    PRN Lance Coon, CRNA   2 mg at 05/01/12 0856  . propofol (DIPRIVAN) 10 mg/mL bolus/IV push    PRN Lance Coon, CRNA   160 mg at 05/01/12 0900    SURGICAL HISTORY:  Past Surgical History  Procedure Laterality Date  . Tonsillectomy    . Tumor removal      FROM PELVIS AGE 19  . Cesarean section  X2   . No past surgeries      BLADDER SLING  4-5 YRS AGO   . Knee arthroscopy      LT KNEE 04/2011  . Total knee arthroplasty  08/15/2011    Procedure: TOTAL KNEE ARTHROPLASTY; lft Surgeon: Harvie Junior, MD;  Location: [facility] OR;  Service: Orthopedics;  Laterality: Left;  RIGHT KNEE CORTIZONE INJECTION  . Mohs surgery      right face  . Knee arthroscopy  82    rt  . Joint replacement  Feb 2013  . Mastectomy w/ sentinel node biopsy  12/19/2011    Procedure: MASTECTOMY WITH SENTINEL LYMPH NODE BIOPSY;  Surgeon: Currie Paris, MD;  Location: [facility] OR;  Service: General;  Laterality: Left;  left breast and left axilla   . Portacath placement  01/16/2012    Procedure: INSERTION PORT-A-CATH;  Surgeon: Currie Paris, MD;  Location: [facility];  Service: General;  Laterality: Right;  Porta Cath Placement   . I&d knee with poly exchange  03/02/2012    Procedure: IRRIGATION AND DEBRIDEMENT KNEE WITH POLY EXCHANGE;  Surgeon: Harvie Junior, MD;  Location: [facility] OR;  Service: Orthopedics;  Laterality: Left;  I&D of total knee with Possible Poly Exchange   . Tee without cardioversion  03/06/2012    Procedure: TRANSESOPHAGEAL ECHOCARDIOGRAM (TEE);  Surgeon: Wendall Stade, MD;  Location: [facility] END[facility]COPY;  Service: Cardiovascular;  Laterality: N/A;  Rm. [id number]  . Port-a-cath removal  03/06/2012    Procedure: REMOVAL PORT-A-CATH;  Surgeon: Adolph Pollack, MD;  Location: [facility];  Service: General;  Laterality: N/A;  . Portacath  placement  05/01/2012    Procedure: INSERTION PORT-A-CATH;  Surgeon: Currie Paris, MD;  Location: [facility];  Service: General;  Laterality: Right;  right internal jugular port-a-cath insertion  . Total knee arthroplasty Left 08/18/2012    Procedure:  Irrigation and debridement of LEFT total knee;  Removal of total knee parts; Implant of spacers;  Surgeon: Nestor Lewandowsky, MD;  Location: [facility] OR;  Service: Orthopedics;  Laterality: Left;  . Port-a-cath removal Right 08/22/2012    Procedure: REMOVAL PORT-A-CATH;  Surgeon: Mariella Saa, MD;  Location: [facility] OR;  Service: General;  Laterality: Right;    REVIEW OF SYSTEMS:   General: fatigue (+), night sweats (-), fever (-), pain (+) Lymph: palpable nodes (-) HEENT: vision changes (-), mucositis (-), gum bleeding (-), epistaxis (-) Cardiovascular: chest pain (-), palpitations (-) Pulmonary: shortness of breath (-), dyspnea on exertion (-), cough (-), hemoptysis (-) GI:  Early satiety (-), melena (-), dysphagia (-), nausea/vomiting (-), diarrhea (-) GU: dysuria (-), hematuria (-), incontinence (-) Musculoskeletal: joint swelling (-), joint pain (-), back pain (-) Neuro: weakness (-), numbness (-), headache (-), confusion (-) Skin: Rash (-), lesions (-), dryness (-) Psych: depression (+), suicidal/homicidal ideation (+), feeling of hopelessness (-)   PHYSICAL EXAMINATION:  BP 173/93  Pulse 63  Temp(Src) 98 F (36.7 C) (Oral)  Resp 20  Ht 5\' 6"  (1.676 m)  Wt 192 lb 6.4 oz (87.272 kg)  BMI 31.07 kg/m2 General: Patient is a well appearing female in no acute distress HEENT: PERRLA, sclerae anicteric no conjunctival pallor, MMM Neck: supple, no palpable adenopathy Lungs: clear to auscultation bilaterally, no wheezes, rhonchi, or rales Cardiovascular: regular rate rhythm, S1, S2, no murmurs, rubs or gallops Abdomen: Soft, non-tender, non-distended, normoactive bowel sounds, no HSM Extremities: warm and well perfused, no  clubbing, cyanosis, or edema Skin: No  rashes or lesions Neuro: Non-focal Breasts: left chest wall mastectomy site no nodularity, erythematous from radiation.   Tenderness on right clavicle and first and second ribs.  ECOG PERFORMANCE STATUS: 1 - Symptomatic but completely ambulatory  LABORATORY DATA: Lab Results  Component Value Date   WBC 7.3 11/09/2012   HGB 10.7* 11/09/2012   HCT 32.7* 11/09/2012   MCV 89.6 11/09/2012   PLT 244 11/09/2012      Chemistry      Component Value Date/Time   NA 138 11/09/2012 1038   NA 138 09/01/2012 1420   K 3.9 11/09/2012 1038   K 3.7 09/01/2012 1420   CL 102 11/09/2012 1038   CL 99 09/01/2012 1420   CO2 28 11/09/2012 1038   CO2 28 09/01/2012 1420   BUN 19.2 11/09/2012 1038   BUN 17 09/01/2012 1420   CREATININE 1.2* 11/09/2012 1038   CREATININE 1.03 09/01/2012 1420      Component Value Date/Time   CALCIUM 9.2 11/09/2012 1038   CALCIUM 9.3 09/01/2012 1420   ALKPHOS 185* 11/09/2012 1038   ALKPHOS 400* 09/01/2012 1420   AST 20 11/09/2012 1038   AST 28 09/01/2012 1420   ALT 22 11/09/2012 1038   ALT <5 09/01/2012 1420   BILITOT 0.48 11/09/2012 1038   BILITOT 0.4 09/01/2012 1420     DOB: [birth year] Age: 60 Gender: F Client Name [place] Collected Date: 12/19/2011 Received Date: 12/19/2011 Physician: Cyndia Bent Chart #: MRN # : [id number] Physician cc: Richardean Chimera, MD Sherian Rein, MD Race:W Visit #: [id number] Kaylyn Lim, RN REPORT OF SURGICAL PATHOLOGY ADDITIONAL INFORMATION: 5. PROGNOSTIC INDICATORS - ACIS Results IMMUNOHISTOCHEMICAL AND MORPHOMETRIC ANALYSIS BY THE AUTOMATED CELLULAR IMAGING SYSTEM (ACIS) Estrogen Receptor (Negative, <1%): 100%, STRONG STAINING INTENSITY Progesterone Receptor (Negative, <1%): 100%, STRONG STAINING INTENSITY Proliferation Marker Ki67 by M IB-1 (Low<20%): 11% All controls stained appropriately Pecola Leisure MD Pathologist, Electronic Signature ( Signed 12/29/2011) 5. CHROMOGENIC IN-SITU  HYBRIDIZATION Interpretation: HER2/NEU BY CISH - SHOWS AMPLIFICATION BY CISH ANALYSIS. THE RATIO OF HER2: CEP 17 SIGNALS WAS 3.27 Reference range: Ratio: HER2:CEP17 < 1.8 gene amplification not observed Ratio: HER2:CEP 17 1.8-2.2 - equivocal result Ratio: HER2:CEP17 > 2.2 - gene amplification observed Pecola Leisure MD Pathologist, Electronic Signature ( Signed 12/27/2011) 1 of 4 FINAL for Shorten, Analeia M ([id number]) FINAL DIAGNOSIS Diagnosis 1. Lymph node, sentinel, biopsy, Left axillary - ONE BENIGN LYMPH NODE WITH NO TUMOR SEEN (0/1). 2. Lymph node, sentinel, biopsy, Left axilla - ONE LYMPH NODE POSITIVE FOR METASTATIC DUCTAL CARCINOMA (1/1). - SEE COMMENT. 3. Lymph node, sentinel, biopsy, Left axilla - ONE BENIGN LYMPH NODE WITH NO TUMOR SEEN (0/1). 4. Lymph node, sentinel, biopsy, Left axilla - ONE BENIGN LYMPH NODE WITH NO TUMOR SEEN (0/1). 5. Breast, simple mastectomy, Left - INVASIVE GRADE I DUCTAL CARCINOMA, TWO FOCI MEASURING 0.8 CM AND 0.6 CM ARISING IN A BACKGROUND OF EXTENSIVE INTERMEDIATE GRADE DUCTAL CARCINOMA IN SITU WITH NECROSIS. - MARGINS ARE NEGATIVE. - SEE ONCOLOGY TEMPLATE. Microscopic Comment 2. On re-review of the frozen section slides, no metastatic carcinoma is identified. The carcinoma present is on permanent sections only. 5. BREAST, INVASIVE TUMOR, WITH LYMPH NODE SAMPLING Specimen, including laterality: Left breast with sentinel lymph node. Procedure: Left simple mastectomy with sentinel lymph node biopsy. Grade: I (Both foci of tumors show similar morphology). Tubule formation: 2. Nuclear pleomorphism: 2. Mitotic: 1. Tumor size (glass slide measurement): Two foci measuring 0.8 cm and 0.6 cm. Margins: Invasive, distance to closest margin: At  least 1.5 cm. In-situ, distance to closest margin: At least 1.5 cm. Lymphovascular invasion: No definite lymph/vascular invasion is not identified however a sentinel lymph node is positive (see  below). Ductal carcinoma in situ: Yes. Grade: Intermediate grade. Extensive intraductal component: Yes. Lobular neoplasia: Not identified. Tumor focality: Two foci. Treatment effect: Not applicable. Extent of tumor: Tumor confined to breast parenchyma. Lymph nodes: # examined: 4. Lymph nodes with metastasis: 1. Macrometastasis: (> 2.0 mm): 1. Extracapsular extension: Not identified. Breast prognostic profile: A breast prognostic profile will be performed (see below comment). Additional breast findings: Fibrocystic changes with usual ductal hyperplasia. TNM: mpT1b, pN1a, MX. Comment: The palpable nodular area seen grossly is comprised predominantly of intermediate grade ductal carcinoma in situ with only two foci of invasive ductal carcinoma. Both invasive foci are morphologically similar. As such a breast prognostic profile will only be performed on the larger tumor. A breast prognostic profile can be performed upon the smaller tumor per request. (RH:kh 12-21-11) 2 of 4   ASSESSMENT: 52 year old female with  #1 stage II invasive ductal carcinoma of the left breast with ductal carcinoma in situ. Patient is status post mastectomy that showed 2 foci of invasive cancer measuring 0.8 0.6 cm. The tumor is ER positive PR positive HER-2/neu amplified at 3.27. Patient is now seen in medical oncology for discussion of adjuvant treatment. As mentioned above her case was discussed at the multidisciplinary breast conference. Patient is noted to have one of 4 lymph nodes positive for metastatic disease. This was only axillary sentinel node biopsy. A discussion of whether patient under should undergo a full axillary lymph node dissection did take place but at the end of our extensive discussion it was recommended the patient will proceed with radiation and DOS she would feel the criteria of seed 11. Therefore is excellent lymph node dissection is not being done. Instead the patient will proceed with adjuvant  chemotherapy and Herceptin. Her chemotherapy will consist of Taxotere carboplatinum given every 21 days. For the duration of her chemotherapy patient will get Herceptin on a weekly basis. Once she completes the chemotherapy she then will get Herceptin every 3 weeks. After completion of chemotherapy patient will proceed with radiation therapy with concurrent Herceptin. After completion of radiation she will start Herceptin and antiestrogen therapy consisting of either tamoxifen or an aromatase inhibitor.  #2 patient has now completed radiation therapy and Herceptin combination. Her radiation completed on 06/04/2012.  #3 we will reinitiate combination chemotherapy and Herceptin on 08/03/2012. The chemotherapy will consist of Taxotere or and carboplatinum with Herceptin. The Taxotere carboplatinum was be given every 21 days to complete out 2 more cycles. Herceptin will be given on a weekly basis.  She received her last cycle on 09/21/12, and then resumed every three week Herceptin afterwards which she will continue through August.   PLAN:  #1 Ms. Riepe is doing well.   She will proceed with Herceptin. Due to the suicidal ideation, I have called social work.  Abigail our Child psychotherapist evaluated her.  She will f/u with a psychology appt for counseling on Friday, and we will get her in with psychiatrist Dr. Nolen Mu and his staff for improved medication management for her depression.    #2 Patient will continue to see her orthopedic surgeon, Dr. Luiz Blare for her right knee.  #3 she continue receiving every 3 week Herceptin until August 2013.  She will return in 3 weeks time.    #4 She continues to have pain in her left upper  chest unrelieved by Advil and tylenol.  We will order imaging.        All questions were answered. The patient knows to call the clinic with any problems, questions or concerns. We can certainly see the patient much sooner if necessary.  I spent 25 minutes counseling the patient face to  face. The total time spent in the appointment was 30 minutes.  Cherie Ouch Lyn Hollingshead, NP Medical Oncology [facility] Phone: [phone] 11/10/2012, 5:37 AM

## 2012-11-16 ENCOUNTER — Ambulatory Visit (HOSPITAL_BASED_OUTPATIENT_CLINIC_OR_DEPARTMENT_OTHER): Payer: 59

## 2012-11-16 ENCOUNTER — Ambulatory Visit
Admission: RE | Admit: 2012-11-16 | Discharge: 2012-11-16 | Disposition: A | Discharge status: Home / Self Care | Payer: 59 | Source: Ambulatory Visit | Attending: Oncology | Admitting: Oncology

## 2012-11-16 VITALS — BP 165/87 | HR 70 | Temp 98.2°F | Resp 20

## 2012-11-16 DIAGNOSIS — Z853 Personal history of malignant neoplasm of breast: Secondary | ICD-10-CM

## 2012-11-16 DIAGNOSIS — Z9012 Acquired absence of left breast and nipple: Secondary | ICD-10-CM

## 2012-11-16 DIAGNOSIS — Z1231 Encounter for screening mammogram for malignant neoplasm of breast: Secondary | ICD-10-CM

## 2012-11-16 DIAGNOSIS — C50419 Malignant neoplasm of upper-outer quadrant of unspecified female breast: Secondary | ICD-10-CM

## 2012-11-16 DIAGNOSIS — Z452 Encounter for adjustment and management of vascular access device: Secondary | ICD-10-CM

## 2012-11-16 LAB — HM MAMMOGRAPHY: HM Mammogram: NORMAL

## 2012-11-16 MED ORDER — SODIUM CHLORIDE 0.9 % IJ SOLN
10.0000 mL | INTRAMUSCULAR | Status: DC | PRN
  Administered 2012-11-16: 10 mL via INTRAVENOUS
  Filled 2012-11-16: qty 10

## 2012-11-16 MED ORDER — HEPARIN SOD (PORK) LOCK FLUSH 100 UNIT/ML IV SOLN
500.0000 [IU] | Freq: Once | INTRAVENOUS | Status: AC
  Administered 2012-11-16: 250 [IU] via INTRAVENOUS
  Filled 2012-11-16: qty 5

## 2012-11-19 ENCOUNTER — Encounter (HOSPITAL_COMMUNITY)
Admission: RE | Admit: 2012-11-19 | Discharge: 2012-11-19 | Disposition: A | Discharge status: Home / Self Care | Payer: 59 | Source: Ambulatory Visit | Attending: Adult Health | Admitting: Adult Health

## 2012-11-19 ENCOUNTER — Other Ambulatory Visit: Payer: Self-pay | Admitting: Oncology

## 2012-11-19 DIAGNOSIS — M898X9 Other specified disorders of bone, unspecified site: Secondary | ICD-10-CM

## 2012-11-19 DIAGNOSIS — M949 Disorder of cartilage, unspecified: Secondary | ICD-10-CM | POA: Insufficient documentation

## 2012-11-19 DIAGNOSIS — M899 Disorder of bone, unspecified: Secondary | ICD-10-CM | POA: Insufficient documentation

## 2012-11-19 MED ORDER — TECHNETIUM TC 99M MEDRONATE IV KIT
25.0000 | PACK | Freq: Once | INTRAVENOUS | Status: AC | PRN
  Administered 2012-11-19: 25 via INTRAVENOUS

## 2012-11-20 ENCOUNTER — Other Ambulatory Visit: Payer: Self-pay | Admitting: Oncology

## 2012-11-20 DIAGNOSIS — R928 Other abnormal and inconclusive findings on diagnostic imaging of breast: Secondary | ICD-10-CM

## 2012-11-21 ENCOUNTER — Encounter: Payer: Self-pay | Admitting: Adult Health

## 2012-11-22 ENCOUNTER — Encounter: Payer: Self-pay | Admitting: Adult Health

## 2012-11-23 ENCOUNTER — Encounter: Payer: Self-pay | Admitting: *Deleted

## 2012-11-23 ENCOUNTER — Ambulatory Visit (HOSPITAL_BASED_OUTPATIENT_CLINIC_OR_DEPARTMENT_OTHER): Payer: 59

## 2012-11-23 VITALS — BP 167/103 | HR 62 | Temp 98.3°F | Resp 20

## 2012-11-23 DIAGNOSIS — C50419 Malignant neoplasm of upper-outer quadrant of unspecified female breast: Secondary | ICD-10-CM

## 2012-11-23 DIAGNOSIS — Z452 Encounter for adjustment and management of vascular access device: Secondary | ICD-10-CM

## 2012-11-23 MED ORDER — HEPARIN SOD (PORK) LOCK FLUSH 100 UNIT/ML IV SOLN
250.0000 [IU] | Freq: Once | INTRAVENOUS | Status: DC
  Filled 2012-11-23: qty 5

## 2012-11-23 MED ORDER — SODIUM CHLORIDE 0.9 % IJ SOLN
10.0000 mL | Freq: Once | INTRAMUSCULAR | Status: AC
  Administered 2012-11-23: 10 mL
  Filled 2012-11-23: qty 10

## 2012-11-23 MED ORDER — HEPARIN SOD (PORK) LOCK FLUSH 100 UNIT/ML IV SOLN
250.0000 [IU] | Freq: Once | INTRAVENOUS | Status: AC
  Administered 2012-11-23: 250 [IU] via INTRAVENOUS
  Filled 2012-11-23: qty 5

## 2012-11-23 MED ORDER — SODIUM CHLORIDE 0.9 % IJ SOLN
10.0000 mL | Freq: Once | INTRAMUSCULAR | Status: DC
  Filled 2012-11-23: qty 10

## 2012-11-23 NOTE — Progress Notes (Signed)
Patient seen by Doctoral counseling intern on 11/16/12 and 11/23/12.  Below is documentation by counselor, Kandy Garrison.  Rufina Falco does not have access to document in patient's medical record, therefore, CSW provided the following documentation and will route to patient's healthcare team.  Counselor met with Pt for intake on 11/16/12, Pt expressed suicidality.  After being assessed for severity of suicidality, counselor and pt made a safety plan, and Pt agreed to talk to her sister this week about her feelings.  Saw client today on 11/23/12, Pt indicated that she did not talk to her sister, and that her recent worries about needing another mammogram and not recieving her bone scans results might cause her to feel more motivated in ending her life. Pt very worried she will recieve bad news and doesn't know how to handle it. Counselor and Pt agreed to talk to the Pts husband together to let him know how the Pt has been feeling. Husband seemed very motivated to support the Pt, and indicated that he had not realized how seriously the Pt was feeling depressed. Pt and Pt's husband will come back in the next few weeks with their children to discuss how the family can best support the Pt.  Counselor consulted with supervisor, Sheral Apley, who recommended including a note in the Pts file to make her doctors aware of the severity of her depressed feelings and suicidal ideation.    Kathrin Penner, MSW, LCSW Clinical Social Worker [facility] [phone]

## 2012-11-27 ENCOUNTER — Telehealth: Payer: Self-pay | Admitting: Emergency Medicine

## 2012-11-27 NOTE — Telephone Encounter (Signed)
Spoke with patient and informed her that her bone scan was negative. Patient verbalized understanding. No other questions or concerns at this time per patient.

## 2012-11-28 ENCOUNTER — Other Ambulatory Visit: Payer: Self-pay

## 2012-11-28 MED ORDER — AMLODIPINE BESYLATE 10 MG PO TABS: 10.0000 mg | ORAL_TABLET | Freq: Every day | ORAL | Status: DC

## 2012-11-30 ENCOUNTER — Ambulatory Visit (HOSPITAL_BASED_OUTPATIENT_CLINIC_OR_DEPARTMENT_OTHER): Payer: 59

## 2012-11-30 ENCOUNTER — Ambulatory Visit (HOSPITAL_BASED_OUTPATIENT_CLINIC_OR_DEPARTMENT_OTHER): Payer: 59 | Admitting: Adult Health

## 2012-11-30 ENCOUNTER — Other Ambulatory Visit (HOSPITAL_BASED_OUTPATIENT_CLINIC_OR_DEPARTMENT_OTHER): Payer: 59

## 2012-11-30 ENCOUNTER — Encounter: Payer: Self-pay | Admitting: Adult Health

## 2012-11-30 ENCOUNTER — Telehealth: Payer: Self-pay | Admitting: *Deleted

## 2012-11-30 VITALS — BP 181/105 | HR 58 | Temp 97.9°F | Resp 18 | Ht 66.0 in | Wt 194.0 lb

## 2012-11-30 DIAGNOSIS — C50419 Malignant neoplasm of upper-outer quadrant of unspecified female breast: Secondary | ICD-10-CM

## 2012-11-30 DIAGNOSIS — C50412 Malignant neoplasm of upper-outer quadrant of left female breast: Secondary | ICD-10-CM

## 2012-11-30 DIAGNOSIS — Z5112 Encounter for antineoplastic immunotherapy: Secondary | ICD-10-CM

## 2012-11-30 DIAGNOSIS — Z170 Estrogen receptor positive status [ER+]: Secondary | ICD-10-CM

## 2012-11-30 LAB — COMPREHENSIVE METABOLIC PANEL (CC13)
ALT: 21 U/L (ref 0–55)
AST: 18 U/L (ref 5–34)
Albumin: 3.6 g/dL (ref 3.5–5.0)
Alkaline Phosphatase: 195 U/L — ABNORMAL HIGH (ref 40–150)
BUN: 26.9 mg/dL — ABNORMAL HIGH (ref 7.0–26.0)
CO2: 27 mEq/L (ref 22–29)
Calcium: 9.4 mg/dL (ref 8.4–10.4)
Chloride: 100 mEq/L (ref 98–107)
Creatinine: 1.6 mg/dL — ABNORMAL HIGH (ref 0.6–1.1)
Glucose: 91 mg/dl (ref 70–99)
Potassium: 4.1 mEq/L (ref 3.5–5.1)
Sodium: 137 mEq/L (ref 136–145)
Total Bilirubin: 0.38 mg/dL (ref 0.20–1.20)
Total Protein: 6.8 g/dL (ref 6.4–8.3)

## 2012-11-30 LAB — CBC WITH DIFFERENTIAL/PLATELET
BASO%: 0.6 % (ref 0.0–2.0)
Basophils Absolute: 0.1 10*3/uL (ref 0.0–0.1)
EOS%: 8 % — ABNORMAL HIGH (ref 0.0–7.0)
Eosinophils Absolute: 0.6 10*3/uL — ABNORMAL HIGH (ref 0.0–0.5)
HCT: 35.8 % (ref 34.8–46.6)
HGB: 11.9 g/dL (ref 11.6–15.9)
LYMPH%: 18.6 % (ref 14.0–49.7)
MCH: 29.2 pg (ref 25.1–34.0)
MCHC: 33.2 g/dL (ref 31.5–36.0)
MCV: 87.7 fL (ref 79.5–101.0)
MONO#: 0.4 10*3/uL (ref 0.1–0.9)
MONO%: 5.2 % (ref 0.0–14.0)
NEUT#: 5.4 10*3/uL (ref 1.5–6.5)
NEUT%: 67.6 % (ref 38.4–76.8)
Platelets: 235 10*3/uL (ref 145–400)
RBC: 4.08 10*6/uL (ref 3.70–5.45)
RDW: 12.7 % (ref 11.2–14.5)
WBC: 8 10*3/uL (ref 3.9–10.3)
lymph#: 1.5 10*3/uL (ref 0.9–3.3)

## 2012-11-30 LAB — FOLLICLE STIMULATING HORMONE: FSH: 127.6 m[IU]/mL — ABNORMAL HIGH

## 2012-11-30 LAB — LUTEINIZING HORMONE: LH: 81.6 m[IU]/mL — ABNORMAL HIGH

## 2012-11-30 MED ORDER — ACETAMINOPHEN 325 MG PO TABS
650.0000 mg | ORAL_TABLET | Freq: Once | ORAL | Status: AC
  Administered 2012-11-30: 650 mg via ORAL

## 2012-11-30 MED ORDER — HEPARIN SOD (PORK) LOCK FLUSH 100 UNIT/ML IV SOLN
250.0000 [IU] | Freq: Once | INTRAVENOUS | Status: AC | PRN
  Administered 2012-11-30: 250 [IU]
  Filled 2012-11-30: qty 5

## 2012-11-30 MED ORDER — DIPHENHYDRAMINE HCL 25 MG PO CAPS
50.0000 mg | ORAL_CAPSULE | Freq: Once | ORAL | Status: AC
  Administered 2012-11-30: 50 mg via ORAL

## 2012-11-30 MED ORDER — TRASTUZUMAB CHEMO INJECTION 440 MG
6.0000 mg/kg | Freq: Once | INTRAVENOUS | Status: AC
  Administered 2012-11-30: 525 mg via INTRAVENOUS
  Filled 2012-11-30: qty 25

## 2012-11-30 MED ORDER — SODIUM CHLORIDE 0.9 % IJ SOLN
10.0000 mL | INTRAMUSCULAR | Status: DC | PRN
  Administered 2012-11-30: 10 mL
  Filled 2012-11-30: qty 10

## 2012-11-30 MED ORDER — SODIUM CHLORIDE 0.9 % IV SOLN
Freq: Once | INTRAVENOUS | Status: AC
  Administered 2012-11-30: 13:00:00 via INTRAVENOUS

## 2012-11-30 NOTE — Progress Notes (Signed)
OFFICE PROGRESS NOTE  CC  [name] [address] [place] [address] Dr. Cyndia Bent Dr. Richardean Chimera Dr. Mardella Layman Dr. Gustavus Messing ([facility])  DIAGNOSIS: 52 year old female who originally was seen in the multidisciplinary breast clinic for discussion of new diagnosis of left breast cancer. She is now status post left mastectomy with the final pathology revealing an invasive ductal carcinoma that is ER positive PR positive HER-2/neu positive with Ki-67 11%.  PRIOR THERAPY:  #1 patient was originally seen in the multidisciplinary breast clinic on 11/30/2011 for what at that time looked like a ductal carcinoma in situ of the left breast. Due to the extent of disease she was recommended a mastectomy with axillary lymph node sampling.  #2 12/19/2011 patient had a left mastectomy with sentinel lymph node biopsy. Her final pathology revealed 2 foci of invasive ductal carcinoma measuring 0.8 and 0.6 cm. The tumor was low grade ER positive PR positive. Patient's tumor was found to be HER-2/neu positive with HER-2 ratio of 3.27. Ki-67 was 11%. Patient had sentinel lymph node  Biopsies performed one of 4 lymph nodes were positive for metastatic disease. Patient's final pathologic staging is T1 N1 M0. Stage II  #3 patient has received adjuvant chemotherapy consisting of Taxotere carboplatinum and Herceptin for 2 cycles starting in 01/2012. This was then complicated by development of bacteremia left knee sepsis requiring revision of the knee. Port-A-Cath site infection requiring removal of the Port-A-Cath and placement of a PICC line.  #4 patient then received Herceptin every 3 weeks with radiation therapy  From 10/ 29 through 06/04/12 while she finished up her antibiotics.  #5 patient completed Taxotere carboplatinum and Herceptin combination to finish out her last 2 cycles. We discussed the risks and benefits of this again. This was started on  08/03/12, and she finished two cycles.  The second cycle was delayed due to another incidence of left knee septic arthritis.  Her hardware was removed, she was placed on IV antibiotics, and was cleared by infectious disease to finish her last cycle of [facility] which she did on 09/21/12.    #7 She resumed every three week Herceptin on 10/19/12.   CURRENT THERAPY: Every 3 week Herceptin  INTERVAL HISTORY: Julie Ross 52 y.o. female returns for Followup visit. Her mood is slightly improved today.  She did have a mammogram that required further imaging which is scheduled next week.  She also has her echo and Dr. Gala Romney appt next week.  She continues to receive the herceptin and is doing well with this.  She also had a bone scan that was negative.  She denies fevers, chills, nausea, vomiting, constipation, diarrhea.  Her joint pain in her knees is increased and Dr. Luiz Blare has increased her percocet dose.  She denies chest pain, palpitations, DOE, PND, lower extremity swelling, or any other concerns.  She does relay that she will be going to [facility] on 6/27 to have a second opinion from ortho regarding her knee.    MEDICAL HISTORY: Past Medical History  Diagnosis Date  . Hypertension   . Hypothyroidism   . Arthritis   . Neuromuscular disorder     MS TX WITH CYMBALTA   . Multiple sclerosis   . H/O colonoscopy   . H/O bone density study 03/2011  . Wears glasses   . Heart burn   . Depression   . Sleep apnea     MILD SLEEP APNEA, NO MACHINE  6 YRS  [facility]   . Breast cancer     lul/ER+PR+    ALLERGIES:  has No Known Allergies.  MEDICATIONS:  Current Outpatient Prescriptions  Medication Sig Dispense Refill  . ALPRAZolam (XANAX) 0.5 MG tablet Take 1 tablet (0.5 mg total) by mouth 3 (three) times daily as needed for anxiety.  15 tablet  0  . amLODipine (NORVASC) 10 MG tablet Take 1 tablet (10 mg total) by mouth daily.  30 tablet  3  . aspirin EC 325 MG EC tablet Take 1 tablet (325 mg total) by  mouth 2 (two) times daily.  30 tablet  0  . atenolol (TENORMIN) 25 MG tablet Take 0.5 tablets (12.5 mg total) by mouth daily. For blood pressure  15 tablet  0  . baclofen (LIORESAL) 10 MG tablet 10 mg 3 (three) times daily.       Marland Kitchen CALCIUM-MAGNESIUM PO Take 1 tablet by mouth daily.      Marland Kitchen ceFAZolin (ANCEF) 2-3 GM-% SOLR Inject 50 mLs (2 g total) into the vein every 8 (eight) hours.  126 each  0  . cephALEXin (KEFLEX) 500 MG capsule       . Diclofenac-Misoprostol 75-0.2 MG TBEC Take 75 mg by mouth Twice daily as needed (for inflammation). Take 1 by mouth twice daily as needed      . DULoxetine (CYMBALTA) 30 MG capsule Take 3 capsules (90 mg total) by mouth daily.  30 capsule  0  . esomeprazole (NEXIUM) 40 MG capsule Take 40 mg by mouth daily before breakfast.      . losartan-hydrochlorothiazide (HYZAAR) 100-25 MG per tablet Take 1 tablet by mouth daily.       . Multiple Vitamins-Minerals (MULTIVITAMINS THER. W/MINERALS) TABS Take 1 tablet by mouth daily.      . OxyCODONE (OXYCONTIN) 20 mg T12A Take 1 tablet (20 mg total) by mouth every 12 (twelve) hours.  30 tablet  0  . oxyCODONE-acetaminophen (PERCOCET/ROXICET) 5-325 MG per tablet Take 1-2 tablets by mouth every 6 (six) hours as needed for pain.  60 tablet  0  . Probiotic Product (PROBIOTIC DAILY PO) Take 1 capsule by mouth daily.       . prochlorperazine (COMPAZINE) 10 MG tablet Take 1 tablet (10 mg total) by mouth every 6 (six) hours as needed.  30 tablet  0   No current facility-administered medications for this visit.   Facility-Administered Medications Ordered in Other Visits  Medication Dose Route Frequency Provider Last Rate Last Dose  . ceFAZolin (ANCEF) IVPB 2 g/50 mL premix    PRN Lance Coon, CRNA   2 g at 08/18/12 1553  . dexamethasone (DECADRON) injection    PRN Lance Coon, CRNA   8 mg at 05/01/12 0908  . fentaNYL (SUBLIMAZE) injection    PRN Lance Coon, CRNA   100 mcg at 05/01/12 0856  . lidocaine (cardiac) 100 mg/53ml  (XYLOCAINE) 20 MG/ML injection 2%    PRN Lance Coon, CRNA   60 mg at 05/01/12 0856  . midazolam (VERSED) 5 MG/5ML injection    PRN Lance Coon, CRNA   2 mg at 05/01/12 0856  . propofol (DIPRIVAN) 10 mg/mL bolus/IV push    PRN Lance Coon, CRNA   160 mg at 05/01/12 0900    SURGICAL HISTORY:  Past Surgical History  Procedure Laterality Date  . Tonsillectomy    . Tumor removal      FROM PELVIS AGE 89  . Cesarean section      X2   .  No past surgeries      BLADDER SLING  4-5 YRS AGO   . Knee arthroscopy      LT KNEE 04/2011  . Total knee arthroplasty  08/15/2011    Procedure: TOTAL KNEE ARTHROPLASTY; lft Surgeon: Harvie Junior, MD;  Location: [facility] OR;  Service: Orthopedics;  Laterality: Left;  RIGHT KNEE CORTIZONE INJECTION  . Mohs surgery      right face  . Knee arthroscopy  82    rt  . Joint replacement  Feb 2013  . Mastectomy w/ sentinel node biopsy  12/19/2011    Procedure: MASTECTOMY WITH SENTINEL LYMPH NODE BIOPSY;  Surgeon: Currie Paris, MD;  Location: [facility] OR;  Service: General;  Laterality: Left;  left breast and left axilla   . Portacath placement  01/16/2012    Procedure: INSERTION PORT-A-CATH;  Surgeon: Currie Paris, MD;  Location: [facility];  Service: General;  Laterality: Right;  Porta Cath Placement   . I&d knee with poly exchange  03/02/2012    Procedure: IRRIGATION AND DEBRIDEMENT KNEE WITH POLY EXCHANGE;  Surgeon: Harvie Junior, MD;  Location: [facility] OR;  Service: Orthopedics;  Laterality: Left;  I&D of total knee with Possible Poly Exchange   . Tee without cardioversion  03/06/2012    Procedure: TRANSESOPHAGEAL ECHOCARDIOGRAM (TEE);  Surgeon: Wendall Stade, MD;  Location: [facility] END[facility]COPY;  Service: Cardiovascular;  Laterality: N/A;  Rm. [id number]  . Port-a-cath removal  03/06/2012    Procedure: REMOVAL PORT-A-CATH;  Surgeon: Adolph Pollack, MD;  Location: [facility] OR;  Service: General;  Laterality: N/A;  . Portacath placement  05/01/2012    Procedure:  INSERTION PORT-A-CATH;  Surgeon: Currie Paris, MD;  Location: [facility];  Service: General;  Laterality: Right;  right internal jugular port-a-cath insertion  . Total knee arthroplasty Left 08/18/2012    Procedure:  Irrigation and debridement of LEFT total knee;  Removal of total knee parts; Implant of spacers;  Surgeon: Nestor Lewandowsky, MD;  Location: [facility] OR;  Service: Orthopedics;  Laterality: Left;  . Port-a-cath removal Right 08/22/2012    Procedure: REMOVAL PORT-A-CATH;  Surgeon: Mariella Saa, MD;  Location: [facility] OR;  Service: General;  Laterality: Right;    REVIEW OF SYSTEMS:   General: fatigue (+), night sweats (-), fever (-), pain (+) Lymph: palpable nodes (-) HEENT: vision changes (-), mucositis (-), gum bleeding (-), epistaxis (-) Cardiovascular: chest pain (-), palpitations (-) Pulmonary: shortness of breath (-), dyspnea on exertion (-), cough (-), hemoptysis (-) GI:  Early satiety (-), melena (-), dysphagia (-), nausea/vomiting (-), diarrhea (-) GU: dysuria (-), hematuria (-), incontinence (-) Musculoskeletal: joint swelling (-), joint pain (-), back pain (-) Neuro: weakness (-), numbness (-), headache (-), confusion (-) Skin: Rash (-), lesions (-), dryness (-) Psych: depression (+), suicidal/homicidal ideation (+), feeling of hopelessness (-)   PHYSICAL EXAMINATION:  BP 181/105  Pulse 58  Temp(Src) 97.9 F (36.6 C) (Oral)  Resp 18  Ht 5\' 6"  (1.676 Ross)  Wt 194 lb (87.998 kg)  BMI 31.33 kg/m2  LMP 12/05/2011 General: Patient is a well appearing female in no acute distress HEENT: PERRLA, sclerae anicteric no conjunctival pallor, MMM Neck: supple, no palpable adenopathy Lungs: clear to auscultation bilaterally, no wheezes, rhonchi, or rales Cardiovascular: regular rate rhythm, S1, S2, no murmurs, rubs or gallops Abdomen: Soft, non-tender, non-distended, normoactive bowel sounds, no HSM Extremities: warm and well perfused, no clubbing, cyanosis, or  edema Skin: No rashes or lesions  Neuro: Non-focal Breasts: left chest wall mastectomy site no nodularity, erythematous from radiation.   Tenderness on right clavicle and first and second ribs.  ECOG PERFORMANCE STATUS: 1 - Symptomatic but completely ambulatory  LABORATORY DATA: Lab Results  Component Value Date   WBC 8.0 11/30/2012   HGB 11.9 11/30/2012   HCT 35.8 11/30/2012   MCV 87.7 11/30/2012   PLT 235 11/30/2012      Chemistry      Component Value Date/Time   NA 138 11/09/2012 1038   NA 138 09/01/2012 1420   K 3.9 11/09/2012 1038   K 3.7 09/01/2012 1420   CL 102 11/09/2012 1038   CL 99 09/01/2012 1420   CO2 28 11/09/2012 1038   CO2 28 09/01/2012 1420   BUN 19.2 11/09/2012 1038   BUN 17 09/01/2012 1420   CREATININE 1.2* 11/09/2012 1038   CREATININE 1.03 09/01/2012 1420      Component Value Date/Time   CALCIUM 9.2 11/09/2012 1038   CALCIUM 9.3 09/01/2012 1420   ALKPHOS 185* 11/09/2012 1038   ALKPHOS 400* 09/01/2012 1420   AST 20 11/09/2012 1038   AST 28 09/01/2012 1420   ALT 22 11/09/2012 1038   ALT <5 09/01/2012 1420   BILITOT 0.48 11/09/2012 1038   BILITOT 0.4 09/01/2012 1420     DOB: [birth year] Age: 35 Gender: F Client Name [facility] Collected Date: 12/19/2011 Received Date: 12/19/2011 Physician: Cyndia Bent Chart #: MRN # : [id number] Physician cc: Richardean Chimera, MD Sherian Rein, MD Race:W Visit #: [id number] Kaylyn Lim, RN REPORT OF SURGICAL PATHOLOGY ADDITIONAL INFORMATION: 5. PROGNOSTIC INDICATORS - ACIS Results IMMUNOHISTOCHEMICAL AND MORPHOMETRIC ANALYSIS BY THE AUTOMATED CELLULAR IMAGING SYSTEM (ACIS) Estrogen Receptor (Negative, <1%): 100%, STRONG STAINING INTENSITY Progesterone Receptor (Negative, <1%): 100%, STRONG STAINING INTENSITY Proliferation Marker Ki67 by Ross IB-1 (Low<20%): 11% All controls stained appropriately Pecola Leisure MD Pathologist, Electronic Signature ( Signed 12/29/2011) 5. CHROMOGENIC IN-SITU  HYBRIDIZATION Interpretation: HER2/NEU BY CISH - SHOWS AMPLIFICATION BY CISH ANALYSIS. THE RATIO OF HER2: CEP 17 SIGNALS WAS 3.27 Reference range: Ratio: HER2:CEP17 < 1.8 gene amplification not observed Ratio: HER2:CEP 17 1.8-2.2 - equivocal result Ratio: HER2:CEP17 > 2.2 - gene amplification observed Pecola Leisure MD Pathologist, Electronic Signature ( Signed 12/27/2011) 1 of 4 FINAL for Julie Ross, Julie Ross ([id number]) FINAL DIAGNOSIS Diagnosis 1. Lymph node, sentinel, biopsy, Left axillary - ONE BENIGN LYMPH NODE WITH NO TUMOR SEEN (0/1). 2. Lymph node, sentinel, biopsy, Left axilla - ONE LYMPH NODE POSITIVE FOR METASTATIC DUCTAL CARCINOMA (1/1). - SEE COMMENT. 3. Lymph node, sentinel, biopsy, Left axilla - ONE BENIGN LYMPH NODE WITH NO TUMOR SEEN (0/1). 4. Lymph node, sentinel, biopsy, Left axilla - ONE BENIGN LYMPH NODE WITH NO TUMOR SEEN (0/1). 5. Breast, simple mastectomy, Left - INVASIVE GRADE I DUCTAL CARCINOMA, TWO FOCI MEASURING 0.8 CM AND 0.6 CM ARISING IN A BACKGROUND OF EXTENSIVE INTERMEDIATE GRADE DUCTAL CARCINOMA IN SITU WITH NECROSIS. - MARGINS ARE NEGATIVE. - SEE ONCOLOGY TEMPLATE. Microscopic Comment 2. On re-review of the frozen section slides, no metastatic carcinoma is identified. The carcinoma present is on permanent sections only. 5. BREAST, INVASIVE TUMOR, WITH LYMPH NODE SAMPLING Specimen, including laterality: Left breast with sentinel lymph node. Procedure: Left simple mastectomy with sentinel lymph node biopsy. Grade: I (Both foci of tumors show similar morphology). Tubule formation: 2. Nuclear pleomorphism: 2. Mitotic: 1. Tumor size (glass slide measurement): Two foci measuring 0.8 cm and 0.6 cm. Margins: Invasive, distance to closest margin: At least 1.5 cm.  In-situ, distance to closest margin: At least 1.5 cm. Lymphovascular invasion: No definite lymph/vascular invasion is not identified however a sentinel lymph node is positive (see  below). Ductal carcinoma in situ: Yes. Grade: Intermediate grade. Extensive intraductal component: Yes. Lobular neoplasia: Not identified. Tumor focality: Two foci. Treatment effect: Not applicable. Extent of tumor: Tumor confined to breast parenchyma. Lymph nodes: # examined: 4. Lymph nodes with metastasis: 1. Macrometastasis: (> 2.0 mm): 1. Extracapsular extension: Not identified. Breast prognostic profile: A breast prognostic profile will be performed (see below comment). Additional breast findings: Fibrocystic changes with usual ductal hyperplasia. TNM: mpT1b, pN1a, MX. Comment: The palpable nodular area seen grossly is comprised predominantly of intermediate grade ductal carcinoma in situ with only two foci of invasive ductal carcinoma. Both invasive foci are morphologically similar. As such a breast prognostic profile will only be performed on the larger tumor. A breast prognostic profile can be performed upon the smaller tumor per request. (RH:kh 12-21-11) 2 of 4   ASSESSMENT: 52 year old female with  #1 stage II invasive ductal carcinoma of the left breast with ductal carcinoma in situ. Patient is status post mastectomy that showed 2 foci of invasive cancer measuring 0.8 0.6 cm. The tumor is ER positive PR positive HER-2/neu amplified at 3.27. Patient is now seen in medical oncology for discussion of adjuvant treatment. As mentioned above her case was discussed at the multidisciplinary breast conference. Patient is noted to have one of 4 lymph nodes positive for metastatic disease. This was only axillary sentinel node biopsy. A discussion of whether patient under should undergo a full axillary lymph node dissection did take place but at the end of our extensive discussion it was recommended the patient will proceed with radiation and DOS she would feel the criteria of seed 11. Therefore is excellent lymph node dissection is not being done. Instead the patient will proceed with adjuvant  chemotherapy and Herceptin. Her chemotherapy will consist of Taxotere carboplatinum given every 21 days. For the duration of her chemotherapy patient will get Herceptin on a weekly basis. Once she completes the chemotherapy she then will get Herceptin every 3 weeks. After completion of chemotherapy patient will proceed with radiation therapy with concurrent Herceptin. After completion of radiation she will start Herceptin and antiestrogen therapy consisting of either tamoxifen or an aromatase inhibitor.  #2 patient has now completed radiation therapy and Herceptin combination. Her radiation completed on 06/04/2012.  #3 we will reinitiate combination chemotherapy and Herceptin on 08/03/2012. The chemotherapy will consist of Taxotere or and carboplatinum with Herceptin. The Taxotere carboplatinum was be given every 21 days to complete out 2 more cycles. Herceptin will be given on a weekly basis.  She received her last cycle on 09/21/12, and then resumed every three week Herceptin afterwards which she will continue through August.   #4 Discussed anti-estrogen therapy with Julie Ross.  She is incredibly sedentary due to her knee, therefore, Tamoxifen would not be a good choice due to the VTE risk.  We also discussed aromatase inhibitors.  Her lmp was over one year ago however she did receive chemotherapy for a few of these months.  We will add on [facility], LH, and estradiol today and likely start Letrozole on Monday.  She is aware of the risks and benefits of therapy.  We will also order a baseline bone density test.     PLAN:  #1 Julie Ross is doing well.   She will proceed with Herceptin. Her suicidal ideation is improved.  She is  following up with Dr. Gala Romney for her echo and heart monitoring next week.  I will call her on Monday to discuss Letrozole further.  I gave her information in the AVS.  She states she will try it, however if it worsens the pain in her knees she will not be able to tolerate it.    #2  Patient will continue to see her orthopedic surgeon, Dr. Luiz Blare for her right knee.  She will also go to [facility] on 6/27 for second opinion.    #3 she continue receiving every 3 week Herceptin until August 2013.  She will return in 3 weeks time.     All questions were answered. The patient knows to call the clinic with any problems, questions or concerns. We can certainly see the patient much sooner if necessary.  I spent 25 minutes counseling the patient face to face. The total time spent in the appointment was 30 minutes.  Cherie Ouch Lyn Hollingshead, NP Medical Oncology [facility] Phone: [phone] 11/30/2012, 11:47 AM

## 2012-11-30 NOTE — Telephone Encounter (Signed)
appts made and printed. Pt is aware that i staff message LM to schedule the pt for Genetics...td

## 2012-11-30 NOTE — Patient Instructions (Addendum)
[facility] Discharge Instructions for Patients Receiving Chemotherapy  Today you received the following chemotherapy agent Herceptin.  To help prevent nausea and vomiting after your treatment, we encourage you to take your nausea medication.   If you develop nausea and vomiting that is not controlled by your nausea medication, call the clinic.   BELOW ARE SYMPTOMS THAT SHOULD BE REPORTED IMMEDIATELY:  *FEVER GREATER THAN 100.5 F  *CHILLS WITH OR WITHOUT FEVER  NAUSEA AND VOMITING THAT IS NOT CONTROLLED WITH YOUR NAUSEA MEDICATION  *UNUSUAL SHORTNESS OF BREATH  *UNUSUAL BRUISING OR BLEEDING  TENDERNESS IN MOUTH AND THROAT WITH OR WITHOUT PRESENCE OF ULCERS  *URINARY PROBLEMS  *BOWEL PROBLEMS  UNUSUAL RASH Items with * indicate a potential emergency and should be followed up as soon as possible.  Feel free to call the clinic you have any questions or concerns. The clinic phone number is [phone].   Trastuzumab injection for infusion What is this medicine? TRASTUZUMAB (tras TOO zoo mab) is a monoclonal antibody. It targets a protein called HER2. This protein is found in some stomach and breast cancers. This medicine can stop cancer cell growth. This medicine may be used with other cancer treatments. This medicine may be used for other purposes; ask your health care provider or pharmacist if you have questions. What should I tell my health care provider before I take this medicine? They need to know if you have any of these conditions: -heart disease -heart failure -infection (especially a virus infection such as chickenpox, cold sores, or herpes) -lung or breathing disease, like asthma -recent or ongoing radiation therapy -an unusual or allergic reaction to trastuzumab, benzyl alcohol, or other medications, foods, dyes, or preservatives -pregnant or trying to get pregnant -breast-feeding How should I use this medicine? This drug is given as an  infusion into a vein. It is administered in a hospital or clinic by a specially trained health care professional. Talk to your pediatrician regarding the use of this medicine in children. This medicine is not approved for use in children. Overdosage: If you think you have taken too much of this medicine contact a poison control center or emergency room at once. NOTE: This medicine is only for you. Do not share this medicine with others. What if I miss a dose? It is important not to miss a dose. Call your doctor or health care professional if you are unable to keep an appointment. What may interact with this medicine? -cyclophosphamide -doxorubicin -warfarin This list may not describe all possible interactions. Give your health care provider a list of all the medicines, herbs, non-prescription drugs, or dietary supplements you use. Also tell them if you smoke, drink alcohol, or use illegal drugs. Some items may interact with your medicine. What should I watch for while using this medicine? Visit your doctor for checks on your progress. Report any side effects. Continue your course of treatment even though you feel ill unless your doctor tells you to stop. Call your doctor or health care professional for advice if you get a fever, chills or sore throat, or other symptoms of a cold or flu. Do not treat yourself. Try to avoid being around people who are sick. You may experience fever, chills and shaking during your first infusion. These effects are usually mild and can be treated with other medicines. Report any side effects during the infusion to your health care professional. Fever and chills usually do not happen with later infusions. What side effects may  I notice from receiving this medicine? Side effects that you should report to your doctor or other health care professional as soon as possible: -breathing difficulties -chest pain or palpitations -cough -dizziness or fainting -fever or chills,  sore throat -skin rash, itching or hives -swelling of the legs or ankles -unusually weak or tired Side effects that usually do not require medical attention (report to your doctor or other health care professional if they continue or are bothersome): -loss of appetite -headache -muscle aches -nausea This list may not describe all possible side effects. Call your doctor for medical advice about side effects. You may report side effects to FDA at 1-800-FDA-1088. Where should I keep my medicine? This drug is given in a hospital or clinic and will not be stored at home. NOTE: This sheet is a summary. It may not cover all possible information. If you have questions about this medicine, talk to your doctor, pharmacist, or health care provider.  2013, [employer]. (04/10/2009 1:43:15 PM)

## 2012-11-30 NOTE — Patient Instructions (Signed)
Doing well.  Proceed with Herceptin.  Letrozole tablets What is this medicine? LETROZOLE (LET roe zole) blocks the production of estrogen. Certain types of breast cancer grow under the influence of estrogen. Letrozole helps block tumor growth. This medicine is used to treat advanced breast cancer in postmenopausal women. This medicine may be used for other purposes; ask your health care provider or pharmacist if you have questions. What should I tell my health care provider before I take this medicine? They need to know if you have any of these conditions: -liver disease -osteoporosis (weak bones) -an unusual or allergic reaction to letrozole, other medicines, foods, dyes, or preservatives -pregnant or trying to get pregnant -breast-feeding How should I use this medicine? Take this medicine by mouth with a glass of water. You may take it with or without food. Follow the directions on the prescription label. Take your medicine at regular intervals. Do not take your medicine more often than directed. Do not stop taking except on your doctor's advice. Talk to your pediatrician regarding the use of this medicine in children. Special care may be needed. Overdosage: If you think you have taken too much of this medicine contact a poison control center or emergency room at once. NOTE: This medicine is only for you. Do not share this medicine with others. What if I miss a dose? If you miss a dose, take it as soon as you can. If it is almost time for your next dose, take only that dose. Do not take double or extra doses. What may interact with this medicine? Do not take this medicine with any of the following medications: -estrogens, like hormone replacement therapy or birth control pills This medicine may also interact with the following medications: -dietary supplements such as androstenedione or DHEA -prasterone -tamoxifen This list may not describe all possible interactions. Give your health  care provider a list of all the medicines, herbs, non-prescription drugs, or dietary supplements you use. Also tell them if you smoke, drink alcohol, or use illegal drugs. Some items may interact with your medicine. What should I watch for while using this medicine? Visit your doctor or health care professional for regular check-ups to monitor your condition. Do not use this drug if you are pregnant. Serious side effects to an unborn child are possible. Talk to your doctor or pharmacist for more information. You may get drowsy or dizzy. Do not drive, use machinery, or do anything that needs mental alertness until you know how this medicine affects you. Do not stand or sit up quickly, especially if you are an older patient. This reduces the risk of dizzy or fainting spells. What side effects may I notice from receiving this medicine? Side effects that you should report to your doctor or health care professional as soon as possible: -allergic reactions like skin rash, itching, or hives -bone fracture -chest pain -difficulty breathing or shortness of breath -severe pain, swelling, warmth in the leg -unusually weak or tired -vaginal bleeding Side effects that usually do not require medical attention (report to your doctor or health care professional if they continue or are bothersome): -bone, back, joint, or muscle pain -dizziness -fatigue -fluid retention -headache -hot flashes, night sweats -nausea -weight gain This list may not describe all possible side effects. Call your doctor for medical advice about side effects. You may report side effects to FDA at 1-800-FDA-1088. Where should I keep my medicine? Keep out of the reach of children. Store between 15 and 30 degrees C (  59 and 86 degrees F). Throw away any unused medicine after the expiration date. NOTE: This sheet is a summary. It may not cover all possible information. If you have questions about this medicine, talk to your doctor,  pharmacist, or health care provider.  2012, [employer]. (08/17/2007 4:43:44 PM)

## 2012-12-03 ENCOUNTER — Encounter: Payer: Self-pay | Admitting: Infectious Disease

## 2012-12-05 ENCOUNTER — Ambulatory Visit
Admission: RE | Admit: 2012-12-05 | Discharge: 2012-12-05 | Disposition: A | Discharge status: Home / Self Care | Payer: 59 | Source: Ambulatory Visit | Attending: Oncology | Admitting: Oncology

## 2012-12-05 DIAGNOSIS — R928 Other abnormal and inconclusive findings on diagnostic imaging of breast: Secondary | ICD-10-CM

## 2012-12-07 ENCOUNTER — Encounter (HOSPITAL_COMMUNITY): Payer: Self-pay

## 2012-12-07 ENCOUNTER — Ambulatory Visit (HOSPITAL_BASED_OUTPATIENT_CLINIC_OR_DEPARTMENT_OTHER)
Admission: RE | Admit: 2012-12-07 | Discharge: 2012-12-07 | Disposition: A | Discharge status: Home / Self Care | Payer: 59 | Source: Ambulatory Visit | Attending: Internal Medicine | Admitting: Internal Medicine

## 2012-12-07 ENCOUNTER — Ambulatory Visit (HOSPITAL_BASED_OUTPATIENT_CLINIC_OR_DEPARTMENT_OTHER): Payer: 59

## 2012-12-07 ENCOUNTER — Ambulatory Visit (HOSPITAL_COMMUNITY)
Admission: RE | Admit: 2012-12-07 | Discharge: 2012-12-07 | Disposition: A | Discharge status: Home / Self Care | Payer: 59 | Source: Ambulatory Visit | Attending: Internal Medicine | Admitting: Internal Medicine

## 2012-12-07 VITALS — BP 155/90 | HR 52 | Temp 97.1°F | Resp 20

## 2012-12-07 VITALS — BP 140/76 | HR 50 | Wt 195.8 lb

## 2012-12-07 DIAGNOSIS — C50912 Malignant neoplasm of unspecified site of left female breast: Secondary | ICD-10-CM

## 2012-12-07 DIAGNOSIS — C50419 Malignant neoplasm of upper-outer quadrant of unspecified female breast: Secondary | ICD-10-CM

## 2012-12-07 DIAGNOSIS — Z09 Encounter for follow-up examination after completed treatment for conditions other than malignant neoplasm: Secondary | ICD-10-CM

## 2012-12-07 DIAGNOSIS — C50412 Malignant neoplasm of upper-outer quadrant of left female breast: Secondary | ICD-10-CM

## 2012-12-07 DIAGNOSIS — Z452 Encounter for adjustment and management of vascular access device: Secondary | ICD-10-CM

## 2012-12-07 DIAGNOSIS — I10 Essential (primary) hypertension: Secondary | ICD-10-CM | POA: Insufficient documentation

## 2012-12-07 DIAGNOSIS — C50919 Malignant neoplasm of unspecified site of unspecified female breast: Secondary | ICD-10-CM | POA: Insufficient documentation

## 2012-12-07 MED ORDER — HEPARIN SOD (PORK) LOCK FLUSH 100 UNIT/ML IV SOLN
500.0000 [IU] | Freq: Once | INTRAVENOUS | Status: DC
  Filled 2012-12-07: qty 5

## 2012-12-07 MED ORDER — SODIUM CHLORIDE 0.9 % IJ SOLN
10.0000 mL | INTRAMUSCULAR | Status: DC | PRN
  Filled 2012-12-07: qty 10

## 2012-12-07 MED ORDER — SODIUM CHLORIDE 0.9 % IJ SOLN
10.0000 mL | INTRAMUSCULAR | Status: DC | PRN
  Administered 2012-12-07: 10 mL via INTRAVENOUS
  Filled 2012-12-07: qty 10

## 2012-12-07 MED ORDER — HEPARIN SOD (PORK) LOCK FLUSH 100 UNIT/ML IV SOLN
500.0000 [IU] | Freq: Once | INTRAVENOUS | Status: AC
  Administered 2012-12-07: 250 [IU] via INTRAVENOUS
  Filled 2012-12-07: qty 5

## 2012-12-07 NOTE — Progress Notes (Signed)
  Echocardiogram 2D Echocardiogram limited has been performed.  Dhanvi Boesen FRANCES 12/07/2012, 11:17 AM

## 2012-12-07 NOTE — Patient Instructions (Signed)
Call MD for problems or concerns 

## 2012-12-07 NOTE — Progress Notes (Signed)
Right PICC dressing change.  Brisk blood return.  Site unremarkable.  Patient tolerated well.

## 2012-12-07 NOTE — Patient Instructions (Signed)
Follow up in mid August with an ECHO and Dr Gala Romney

## 2012-12-07 NOTE — Progress Notes (Signed)
Patient ID: Julie Ross, female   DOB: [birth year], 52 y.o.   MRN: [id number]  Orthopedic: Dr Luiz Blare Oncologist: Dr Welton Flakes ID: Dr Ninetta Lights  HPI: Julie Ross is a 52 year old female with ductal carcinoma in situ of the left breast, HTN, osteoarthritis, and MS (1998)  S/P L mastectomy and porta cath  12/19/11.  Plan to start carboplatnum toxotere/Herceptin to start 01/27/12 for for 12 weeks. HER-2/neu positive. 1 of  4 lymph nodes + for metastatic disease.  Pathology T1 N1 M0. Stage II . 08/15/11 TKR  01/11/12 ECHO EF 60% poor window. 03/02/12 EF 60-65% Lateal S' 13.9 06/11/12 EF 50-55% 09/13/12 EF 55% Lateral S' 13.7 12/07/12 EF 60% lateral s' 13.7  08/18/12 S/P removal L Total Knee hardware with spacer in place. MSSA bacteremia  She returns for follow up. Denies SOB/Orthopnea/PND.  She cotninues of Herceptin and will complete in August 2014.  PICC for IV antibiotics.      Past Medical History  Diagnosis Date  . Hypertension   . Hypothyroidism   . Arthritis   . Neuromuscular disorder     MS TX WITH CYMBALTA   . Multiple sclerosis   . H/O colonoscopy   . H/O bone density study 03/2011  . Wears glasses   . Heart burn   . Depression   . Sleep apnea     MILD SLEEP APNEA, NO MACHINE  6 YRS AGO [facility]   . Breast cancer     lul/ER+PR+   Current Outpatient Prescriptions on File Prior to Encounter  Medication Sig Dispense Refill  . ALPRAZolam (XANAX) 0.5 MG tablet Take 1 tablet (0.5 mg total) by mouth 3 (three) times daily as needed for anxiety.  15 tablet  0  . amLODipine (NORVASC) 10 MG tablet Take 1 tablet (10 mg total) by mouth daily.  30 tablet  3  . aspirin EC 325 MG EC tablet Take 1 tablet (325 mg total) by mouth 2 (two) times daily.  30 tablet  0  . atenolol (TENORMIN) 25 MG tablet Take 0.5 tablets (12.5 mg total) by mouth daily. For blood pressure  15 tablet  0  . baclofen (LIORESAL) 10 MG tablet 10 mg 3 (three) times daily.       Marland Kitchen CALCIUM-MAGNESIUM PO Take 1 tablet by mouth daily.       Marland Kitchen ceFAZolin (ANCEF) 2-3 GM-% SOLR Inject 50 mLs (2 g total) into the vein every 8 (eight) hours.  126 each  0  . cephALEXin (KEFLEX) 500 MG capsule       . Diclofenac-Misoprostol 75-0.2 MG TBEC Take 75 mg by mouth Twice daily as needed (for inflammation). Take 1 by mouth twice daily as needed      . DULoxetine (CYMBALTA) 30 MG capsule Take 3 capsules (90 mg total) by mouth daily.  30 capsule  0  . esomeprazole (NEXIUM) 40 MG capsule Take 40 mg by mouth daily before breakfast.      . losartan-hydrochlorothiazide (HYZAAR) 100-25 MG per tablet Take 1 tablet by mouth daily.       . Multiple Vitamins-Minerals (MULTIVITAMINS THER. W/MINERALS) TABS Take 1 tablet by mouth daily.      . OxyCODONE (OXYCONTIN) 20 mg T12A Take 1 tablet (20 mg total) by mouth every 12 (twelve) hours.  30 tablet  0  . oxyCODONE-acetaminophen (PERCOCET/ROXICET) 5-325 MG per tablet Take 1-2 tablets by mouth every 6 (six) hours as needed for pain.  60 tablet  0  . Probiotic Product (PROBIOTIC DAILY PO) Take  1 capsule by mouth daily.       . prochlorperazine (COMPAZINE) 10 MG tablet Take 1 tablet (10 mg total) by mouth every 6 (six) hours as needed.  30 tablet  0   Current Facility-Administered Medications on File Prior to Encounter  Medication Dose Route Frequency Provider Last Rate Last Dose  . ceFAZolin (ANCEF) IVPB 2 g/50 mL premix    PRN Lance Coon, CRNA   2 g at 08/18/12 1553  . dexamethasone (DECADRON) injection    PRN Lance Coon, CRNA   8 mg at 05/01/12 0908  . fentaNYL (SUBLIMAZE) injection    PRN Lance Coon, CRNA   100 mcg at 05/01/12 0856  . lidocaine (cardiac) 100 mg/68ml (XYLOCAINE) 20 MG/ML injection 2%    PRN Lance Coon, CRNA   60 mg at 05/01/12 0856  . midazolam (VERSED) 5 MG/5ML injection    PRN Lance Coon, CRNA   2 mg at 05/01/12 0856  . propofol (DIPRIVAN) 10 mg/mL bolus/IV push    PRN Lance Coon, CRNA   160 mg at 05/01/12 0900     PHYSICAL EXAM: Filed Vitals:   12/07/12 1050  BP:  140/76  Pulse: 50   General:   No acute distress. No respiratory difficulty (husband present ) Using walker HEENT: normal except for alopecia Neck: supple. no JVD.  Carotids 2+ bilat; no bruits. No lymphadenopathy or thryomegaly appreciated. Cor: PMI nondisplaced. Regular rate & rhythm. No rubs, gallops. 2/6 SEM at RSB Lungs: clear Abdomen: soft, nontender, nondistended. No hepatosplenomegaly. No bruits or masses. Good bowel sounds. Extremities: no cyanosis, clubbing, rash + LLE immobilizer RUE PICC Neuro: alert & oriented x 3, cranial nerves grossly intact. moves all 4 extremities w/o difficulty. Affect pleasant.  ASSESSMENT & PLAN:

## 2012-12-07 NOTE — Assessment & Plan Note (Addendum)
De Bensimhon discussed and reviewed ECHO. EF and lateral S' stable. Follow up in mid August for post herceptin ECHO.   Patient seen and examined with Tonye Becket, NP. We discussed all aspects of the encounter. I agree with the assessment and plan as stated above. I reviewed echos personally. EF and Doppler parameters stable. No HF on exam. Continue Herceptin.

## 2012-12-13 ENCOUNTER — Telehealth: Payer: Self-pay | Admitting: *Deleted

## 2012-12-13 ENCOUNTER — Ambulatory Visit (HOSPITAL_BASED_OUTPATIENT_CLINIC_OR_DEPARTMENT_OTHER): Payer: 59

## 2012-12-13 VITALS — BP 187/97 | HR 49 | Temp 97.2°F | Resp 18

## 2012-12-13 DIAGNOSIS — Z452 Encounter for adjustment and management of vascular access device: Secondary | ICD-10-CM

## 2012-12-13 DIAGNOSIS — C50912 Malignant neoplasm of unspecified site of left female breast: Secondary | ICD-10-CM

## 2012-12-13 DIAGNOSIS — C50419 Malignant neoplasm of upper-outer quadrant of unspecified female breast: Secondary | ICD-10-CM

## 2012-12-13 LAB — ESTRADIOL, ULTRA SENS: Estradiol, Ultra Sensitive: 5 pg/mL

## 2012-12-13 MED ORDER — SODIUM CHLORIDE 0.9 % IJ SOLN
10.0000 mL | INTRAMUSCULAR | Status: DC | PRN
  Administered 2012-12-13: 10 mL via INTRAVENOUS
  Filled 2012-12-13: qty 10

## 2012-12-13 MED ORDER — HEPARIN SOD (PORK) LOCK FLUSH 100 UNIT/ML IV SOLN
500.0000 [IU] | Freq: Once | INTRAVENOUS | Status: AC
  Administered 2012-12-13: 250 [IU] via INTRAVENOUS
  Filled 2012-12-13: qty 5

## 2012-12-13 NOTE — Telephone Encounter (Signed)
Confirmed 02/04/13 genetic appt w/ pt.

## 2012-12-13 NOTE — Patient Instructions (Signed)
Call MD for problems or concerns 

## 2012-12-14 LAB — HM PAP SMEAR: HM Pap smear: NORMAL

## 2012-12-20 ENCOUNTER — Ambulatory Visit (HOSPITAL_BASED_OUTPATIENT_CLINIC_OR_DEPARTMENT_OTHER): Payer: 59

## 2012-12-20 ENCOUNTER — Other Ambulatory Visit (HOSPITAL_BASED_OUTPATIENT_CLINIC_OR_DEPARTMENT_OTHER): Payer: 59 | Admitting: Lab

## 2012-12-20 ENCOUNTER — Encounter: Payer: Self-pay | Admitting: Adult Health

## 2012-12-20 ENCOUNTER — Encounter: Payer: Self-pay | Admitting: Oncology

## 2012-12-20 ENCOUNTER — Ambulatory Visit (HOSPITAL_BASED_OUTPATIENT_CLINIC_OR_DEPARTMENT_OTHER): Payer: 59 | Admitting: Adult Health

## 2012-12-20 VITALS — BP 145/77 | HR 58 | Temp 98.8°F | Resp 20 | Ht 66.0 in | Wt 193.4 lb

## 2012-12-20 DIAGNOSIS — Z5112 Encounter for antineoplastic immunotherapy: Secondary | ICD-10-CM

## 2012-12-20 DIAGNOSIS — C50419 Malignant neoplasm of upper-outer quadrant of unspecified female breast: Secondary | ICD-10-CM

## 2012-12-20 DIAGNOSIS — C50412 Malignant neoplasm of upper-outer quadrant of left female breast: Secondary | ICD-10-CM

## 2012-12-20 DIAGNOSIS — C773 Secondary and unspecified malignant neoplasm of axilla and upper limb lymph nodes: Secondary | ICD-10-CM

## 2012-12-20 DIAGNOSIS — Z170 Estrogen receptor positive status [ER+]: Secondary | ICD-10-CM

## 2012-12-20 DIAGNOSIS — M1710 Unilateral primary osteoarthritis, unspecified knee: Secondary | ICD-10-CM

## 2012-12-20 LAB — COMPREHENSIVE METABOLIC PANEL (CC13)
ALT: 24 U/L (ref 0–55)
AST: 24 U/L (ref 5–34)
Albumin: 3.7 g/dL (ref 3.5–5.0)
Alkaline Phosphatase: 210 U/L — ABNORMAL HIGH (ref 40–150)
BUN: 20.6 mg/dL (ref 7.0–26.0)
CO2: 30 mEq/L — ABNORMAL HIGH (ref 22–29)
Calcium: 9.8 mg/dL (ref 8.4–10.4)
Chloride: 102 mEq/L (ref 98–109)
Creatinine: 1.4 mg/dL — ABNORMAL HIGH (ref 0.6–1.1)
Glucose: 90 mg/dl (ref 70–140)
Potassium: 3.7 mEq/L (ref 3.5–5.1)
Sodium: 141 mEq/L (ref 136–145)
Total Bilirubin: 0.55 mg/dL (ref 0.20–1.20)
Total Protein: 7 g/dL (ref 6.4–8.3)

## 2012-12-20 LAB — CBC WITH DIFFERENTIAL/PLATELET
BASO%: 0.5 % (ref 0.0–2.0)
Basophils Absolute: 0 10*3/uL (ref 0.0–0.1)
EOS%: 8.8 % — ABNORMAL HIGH (ref 0.0–7.0)
Eosinophils Absolute: 0.6 10*3/uL — ABNORMAL HIGH (ref 0.0–0.5)
HCT: 35.5 % (ref 34.8–46.6)
HGB: 11.8 g/dL (ref 11.6–15.9)
LYMPH%: 27.2 % (ref 14.0–49.7)
MCH: 28.4 pg (ref 25.1–34.0)
MCHC: 33.2 g/dL (ref 31.5–36.0)
MCV: 85.3 fL (ref 79.5–101.0)
MONO#: 0.4 10*3/uL (ref 0.1–0.9)
MONO%: 6.1 % (ref 0.0–14.0)
NEUT#: 3.6 10*3/uL (ref 1.5–6.5)
NEUT%: 57.4 % (ref 38.4–76.8)
Platelets: 237 10*3/uL (ref 145–400)
RBC: 4.16 10*6/uL (ref 3.70–5.45)
RDW: 12.6 % (ref 11.2–14.5)
WBC: 6.2 10*3/uL (ref 3.9–10.3)
lymph#: 1.7 10*3/uL (ref 0.9–3.3)

## 2012-12-20 MED ORDER — HEPARIN SOD (PORK) LOCK FLUSH 100 UNIT/ML IV SOLN
250.0000 [IU] | Freq: Once | INTRAVENOUS | Status: AC | PRN
  Administered 2012-12-20: 250 [IU]
  Filled 2012-12-20: qty 5

## 2012-12-20 MED ORDER — SODIUM CHLORIDE 0.9 % IJ SOLN
10.0000 mL | INTRAMUSCULAR | Status: DC | PRN
  Administered 2012-12-20: 10 mL
  Filled 2012-12-20: qty 10

## 2012-12-20 MED ORDER — DIPHENHYDRAMINE HCL 25 MG PO CAPS
50.0000 mg | ORAL_CAPSULE | Freq: Once | ORAL | Status: AC
  Administered 2012-12-20: 50 mg via ORAL

## 2012-12-20 MED ORDER — ACETAMINOPHEN 325 MG PO TABS
650.0000 mg | ORAL_TABLET | Freq: Once | ORAL | Status: AC
  Administered 2012-12-20: 650 mg via ORAL

## 2012-12-20 MED ORDER — TRASTUZUMAB CHEMO INJECTION 440 MG
6.0000 mg/kg | Freq: Once | INTRAVENOUS | Status: AC
  Administered 2012-12-20: 525 mg via INTRAVENOUS
  Filled 2012-12-20: qty 25

## 2012-12-20 MED ORDER — SODIUM CHLORIDE 0.9 % IV SOLN
Freq: Once | INTRAVENOUS | Status: AC
  Administered 2012-12-20: 13:00:00 via INTRAVENOUS

## 2012-12-20 MED ORDER — LETROZOLE 2.5 MG PO TABS: 2.5000 mg | ORAL_TABLET | Freq: Every day | ORAL | Status: DC

## 2012-12-20 NOTE — Patient Instructions (Addendum)
[facility] Discharge Instructions for Patients Receiving Chemotherapy  Today you received the following chemotherapy agents Herceptin.  To help prevent nausea and vomiting after your treatment, we encourage you to take your nausea medication as prescribed.   If you develop nausea and vomiting that is not controlled by your nausea medication, call the clinic.   BELOW ARE SYMPTOMS THAT SHOULD BE REPORTED IMMEDIATELY:  *FEVER GREATER THAN 100.5 F  *CHILLS WITH OR WITHOUT FEVER  NAUSEA AND VOMITING THAT IS NOT CONTROLLED WITH YOUR NAUSEA MEDICATION  *UNUSUAL SHORTNESS OF BREATH  *UNUSUAL BRUISING OR BLEEDING  TENDERNESS IN MOUTH AND THROAT WITH OR WITHOUT PRESENCE OF ULCERS  *URINARY PROBLEMS  *BOWEL PROBLEMS  UNUSUAL RASH Items with * indicate a potential emergency and should be followed up as soon as possible.  Feel free to call the clinic you have any questions or concerns. The clinic phone number is [phone].    

## 2012-12-20 NOTE — Patient Instructions (Signed)
Letrozole tablets What is this medicine? LETROZOLE (LET roe zole) blocks the production of estrogen. Certain types of breast cancer grow under the influence of estrogen. Letrozole helps block tumor growth. This medicine is used to treat advanced breast cancer in postmenopausal women. This medicine may be used for other purposes; ask your health care provider or pharmacist if you have questions. What should I tell my health care provider before I take this medicine? They need to know if you have any of these conditions: -liver disease -osteoporosis (weak bones) -an unusual or allergic reaction to letrozole, other medicines, foods, dyes, or preservatives -pregnant or trying to get pregnant -breast-feeding How should I use this medicine? Take this medicine by mouth with a glass of water. You may take it with or without food. Follow the directions on the prescription label. Take your medicine at regular intervals. Do not take your medicine more often than directed. Do not stop taking except on your doctor's advice. Talk to your pediatrician regarding the use of this medicine in children. Special care may be needed. Overdosage: If you think you have taken too much of this medicine contact a poison control center or emergency room at once. NOTE: This medicine is only for you. Do not share this medicine with others. What if I miss a dose? If you miss a dose, take it as soon as you can. If it is almost time for your next dose, take only that dose. Do not take double or extra doses. What may interact with this medicine? Do not take this medicine with any of the following medications: -estrogens, like hormone replacement therapy or birth control pills This medicine may also interact with the following medications: -dietary supplements such as androstenedione or DHEA -prasterone -tamoxifen This list may not describe all possible interactions. Give your health care provider a list of all the medicines,  herbs, non-prescription drugs, or dietary supplements you use. Also tell them if you smoke, drink alcohol, or use illegal drugs. Some items may interact with your medicine. What should I watch for while using this medicine? Visit your doctor or health care professional for regular check-ups to monitor your condition. Do not use this drug if you are pregnant. Serious side effects to an unborn child are possible. Talk to your doctor or pharmacist for more information. You may get drowsy or dizzy. Do not drive, use machinery, or do anything that needs mental alertness until you know how this medicine affects you. Do not stand or sit up quickly, especially if you are an older patient. This reduces the risk of dizzy or fainting spells. What side effects may I notice from receiving this medicine? Side effects that you should report to your doctor or health care professional as soon as possible: -allergic reactions like skin rash, itching, or hives -bone fracture -chest pain -difficulty breathing or shortness of breath -severe pain, swelling, warmth in the leg -unusually weak or tired -vaginal bleeding Side effects that usually do not require medical attention (report to your doctor or health care professional if they continue or are bothersome): -bone, back, joint, or muscle pain -dizziness -fatigue -fluid retention -headache -hot flashes, night sweats -nausea -weight gain This list may not describe all possible side effects. Call your doctor for medical advice about side effects. You may report side effects to FDA at 1-800-FDA-1088. Where should I keep my medicine? Keep out of the reach of children. Store between 15 and 30 degrees C (59 and 86 degrees F). Throw away   any unused medicine after the expiration date. NOTE: This sheet is a summary. It may not cover all possible information. If you have questions about this medicine, talk to your doctor, pharmacist, or health care provider.  2012,  [employer]/Gold Standard. (08/17/2007 4:43:44 PM) 

## 2012-12-20 NOTE — Progress Notes (Signed)
OFFICE PROGRESS NOTE  CC  [name] [address] [place] [address] Dr. Cyndia Bent Dr. Richardean Chimera Dr. Mardella Layman Dr. Gustavus Messing ([facility])  DIAGNOSIS: 52 year old female who originally was seen in the multidisciplinary breast clinic for discussion of new diagnosis of left breast cancer. She is now status post left mastectomy with the final pathology revealing an invasive ductal carcinoma that is ER positive PR positive HER-2/neu positive with Ki-67 11%.  PRIOR THERAPY:  #1 patient was originally seen in the multidisciplinary breast clinic on 11/30/2011 for what at that time looked like a ductal carcinoma in situ of the left breast. Due to the extent of disease she was recommended a mastectomy with axillary lymph node sampling.  #2 12/19/2011 patient had a left mastectomy with sentinel lymph node biopsy. Her final pathology revealed 2 foci of invasive ductal carcinoma measuring 0.8 and 0.6 cm. The tumor was low grade ER positive PR positive. Patient's tumor was found to be HER-2/neu positive with HER-2 ratio of 3.27. Ki-67 was 11%. Patient had sentinel lymph node  Biopsies performed one of 4 lymph nodes were positive for metastatic disease. Patient's final pathologic staging is T1 N1 M0. Stage II  #3 patient has received adjuvant chemotherapy consisting of Taxotere carboplatinum and Herceptin for 2 cycles starting in 01/2012. This was then complicated by development of bacteremia left knee sepsis requiring revision of the knee. Port-A-Cath site infection requiring removal of the Port-A-Cath and placement of a PICC line.  #4 patient then received Herceptin every 3 weeks with radiation therapy  From 10/ 29 through 06/04/12 while she finished up her antibiotics.  #5 patient completed Taxotere carboplatinum and Herceptin combination to finish out her last 2 cycles. We discussed the risks and benefits of this again. This was started on  08/03/12, and she finished two cycles.  The second cycle was delayed due to another incidence of left knee septic arthritis.  Her hardware was removed, she was placed on IV antibiotics, and was cleared by infectious disease to finish her last cycle of [facility] which she did on 09/21/12.    #7 She resumed every three week Herceptin on 10/19/12.   CURRENT THERAPY: Every 3 week Herceptin  INTERVAL HISTORY: Julie Ross 52 y.o. female returns for Followup visit.  On a screening mammo of her right breast she had an area of concern, after diagnostic mammo it was determined to be a shadow from her port and not recurrence.  She is very relieved with these results.  She saw a specialist at [facility] regarding her knee and the previously infected hardware.  They would like to ensure she is cancer free and wont need any chemotherapy in the near future due to the fact that they have one chance to replace the hardware.  If that is unsuccessful she may require a right AKA.  They plan on doing an arthrocentesis with culture to ensure their is no current infection in the knee before surgery.  She is doing well otherwise.  She has not had any further suicidal ideation.  Otherwise, a 10 point ROS is negative.     MEDICAL HISTORY: Past Medical History  Diagnosis Date  . Hypertension   . Hypothyroidism   . Arthritis   . Neuromuscular disorder     MS TX WITH CYMBALTA   . Multiple sclerosis   . H/O colonoscopy   . H/O bone density study 03/2011  . Wears glasses   . Heart  burn   . Depression   . Sleep apnea     MILD SLEEP APNEA, NO MACHINE  6 YRS AGO HPT [facility]   . Breast cancer     lul/ER+PR+    ALLERGIES:  has No Known Allergies.  MEDICATIONS:  Current Outpatient Prescriptions  Medication Sig Dispense Refill  . ALPRAZolam (XANAX) 0.5 MG tablet Take 1 tablet (0.5 mg total) by mouth 3 (three) times daily as needed for anxiety.  15 tablet  0  . amLODipine (NORVASC) 10 MG tablet Take 1 tablet (10 mg total) by mouth  daily.  30 tablet  3  . aspirin EC 325 MG EC tablet Take 1 tablet (325 mg total) by mouth 2 (two) times daily.  30 tablet  0  . atenolol (TENORMIN) 25 MG tablet Take 0.5 tablets (12.5 mg total) by mouth daily. For blood pressure  15 tablet  0  . baclofen (LIORESAL) 10 MG tablet 10 mg 3 (three) times daily.       Marland Kitchen CALCIUM-MAGNESIUM PO Take 1 tablet by mouth daily.      Marland Kitchen ceFAZolin (ANCEF) 2-3 GM-% SOLR Inject 50 mLs (2 g total) into the vein every 8 (eight) hours.  126 each  0  . cephALEXin (KEFLEX) 500 MG capsule       . Diclofenac-Misoprostol 75-0.2 MG TBEC Take 75 mg by mouth Twice daily as needed (for inflammation). Take 1 by mouth twice daily as needed      . DULoxetine (CYMBALTA) 30 MG capsule Take 3 capsules (90 mg total) by mouth daily.  30 capsule  0  . esomeprazole (NEXIUM) 40 MG capsule Take 40 mg by mouth daily before breakfast.      . losartan-hydrochlorothiazide (HYZAAR) 100-25 MG per tablet Take 1 tablet by mouth daily.       . Multiple Vitamins-Minerals (MULTIVITAMINS THER. W/MINERALS) TABS Take 1 tablet by mouth daily.      . OxyCODONE (OXYCONTIN) 20 mg T12A Take 1 tablet (20 mg total) by mouth every 12 (twelve) hours.  30 tablet  0  . oxyCODONE-acetaminophen (PERCOCET/ROXICET) 5-325 MG per tablet Take 1-2 tablets by mouth every 6 (six) hours as needed for pain.  60 tablet  0  . Probiotic Product (PROBIOTIC DAILY PO) Take 1 capsule by mouth daily.       . prochlorperazine (COMPAZINE) 10 MG tablet Take 1 tablet (10 mg total) by mouth every 6 (six) hours as needed.  30 tablet  0  . letrozole (FEMARA) 2.5 MG tablet Take 1 tablet (2.5 mg total) by mouth daily.  30 tablet  3   No current facility-administered medications for this visit.   Facility-Administered Medications Ordered in Other Visits  Medication Dose Route Frequency Provider Last Rate Last Dose  . ceFAZolin (ANCEF) IVPB 2 g/50 mL premix    PRN Lance Coon, CRNA   2 g at 08/18/12 1553  . dexamethasone (DECADRON)  injection    PRN Lance Coon, CRNA   8 mg at 05/01/12 0908  . fentaNYL (SUBLIMAZE) injection    PRN Lance Coon, CRNA   100 mcg at 05/01/12 0856  . lidocaine (cardiac) 100 mg/77ml (XYLOCAINE) 20 MG/ML injection 2%    PRN Lance Coon, CRNA   60 mg at 05/01/12 0856  . midazolam (VERSED) 5 MG/5ML injection    PRN Lance Coon, CRNA   2 mg at 05/01/12 0856  . propofol (DIPRIVAN) 10 mg/mL bolus/IV push    PRN Lance Coon, CRNA   160 mg at 05/01/12  0900    SURGICAL HISTORY:  Past Surgical History  Procedure Laterality Date  . Tonsillectomy    . Tumor removal      FROM PELVIS AGE 63  . Cesarean section      X2   . No past surgeries      BLADDER SLING  4-5 YRS AGO   . Knee arthroscopy      LT KNEE 04/2011  . Total knee arthroplasty  08/15/2011    Procedure: TOTAL KNEE ARTHROPLASTY; lft Surgeon: Harvie Junior, MD;  Location: [facility] OR;  Service: Orthopedics;  Laterality: Left;  RIGHT KNEE CORTIZONE INJECTION  . Mohs surgery      right face  . Knee arthroscopy  82    rt  . Joint replacement  Feb 2013  . Mastectomy w/ sentinel node biopsy  12/19/2011    Procedure: MASTECTOMY WITH SENTINEL LYMPH NODE BIOPSY;  Surgeon: Currie Paris, MD;  Location: [facility] OR;  Service: General;  Laterality: Left;  left breast and left axilla   . Portacath placement  01/16/2012    Procedure: INSERTION PORT-A-CATH;  Surgeon: Currie Paris, MD;  Location: [facility];  Service: General;  Laterality: Right;  Porta Cath Placement   . I&d knee with poly exchange  03/02/2012    Procedure: IRRIGATION AND DEBRIDEMENT KNEE WITH POLY EXCHANGE;  Surgeon: Harvie Junior, MD;  Location: [facility] OR;  Service: Orthopedics;  Laterality: Left;  I&D of total knee with Possible Poly Exchange   . Tee without cardioversion  03/06/2012    Procedure: TRANSESOPHAGEAL ECHOCARDIOGRAM (TEE);  Surgeon: Wendall Stade, MD;  Location: [facility] ENDOSCOPY;  Service: Cardiovascular;  Laterality: N/A;  Rm. [id number]  . Port-a-cath removal   03/06/2012    Procedure: REMOVAL PORT-A-CATH;  Surgeon: Adolph Pollack, MD;  Location: [facility] OR;  Service: General;  Laterality: N/A;  . Portacath placement  05/01/2012    Procedure: INSERTION PORT-A-CATH;  Surgeon: Currie Paris, MD;  Location: [facility];  Service: General;  Laterality: Right;  right internal jugular port-a-cath insertion  . Total knee arthroplasty Left 08/18/2012    Procedure:  Irrigation and debridement of LEFT total knee;  Removal of total knee parts; Implant of spacers;  Surgeon: Nestor Lewandowsky, MD;  Location: [facility] OR;  Service: Orthopedics;  Laterality: Left;  . Port-a-cath removal Right 08/22/2012    Procedure: REMOVAL PORT-A-CATH;  Surgeon: Mariella Saa, MD;  Location: [facility] OR;  Service: General;  Laterality: Right;    REVIEW OF SYSTEMS:   General: fatigue (+), night sweats (-), fever (-), pain (+) Lymph: palpable nodes (-) HEENT: vision changes (-), mucositis (-), gum bleeding (-), epistaxis (-) Cardiovascular: chest pain (-), palpitations (-) Pulmonary: shortness of breath (-), dyspnea on exertion (-), cough (-), hemoptysis (-) GI:  Early satiety (-), melena (-), dysphagia (-), nausea/vomiting (-), diarrhea (-) GU: dysuria (-), hematuria (-), incontinence (-) Musculoskeletal: joint swelling (-), joint pain (-), back pain (-) Neuro: weakness (-), numbness (-), headache (-), confusion (-) Skin: Rash (-), lesions (-), dryness (-) Psych: depression (+), suicidal/homicidal ideation (+), feeling of hopelessness (-)   PHYSICAL EXAMINATION:  BP 145/77  Pulse 58  Temp(Src) 98.8 F (37.1 C) (Oral)  Resp 20  Ht 5\' 6"  (1.676 m)  Wt 193 lb 6.4 oz (87.726 kg)  BMI 31.23 kg/m2  LMP 12/05/2011 General: Patient is a well appearing female in no acute distress HEENT: PERRLA, sclerae anicteric no conjunctival pallor, MMM Neck: supple, no palpable  adenopathy Lungs: clear to auscultation bilaterally, no wheezes, rhonchi, or rales Cardiovascular: regular  rate rhythm, S1, S2, no murmurs, rubs or gallops Abdomen: Soft, non-tender, non-distended, normoactive bowel sounds, no HSM Extremities: warm and well perfused, no clubbing, cyanosis, or edema Skin: No rashes or lesions Neuro: Non-focal Breasts: left chest wall mastectomy site no nodularity, erythematous from radiation.   Tenderness on right clavicle and first and second ribs.  ECOG PERFORMANCE STATUS: 1 - Symptomatic but completely ambulatory  LABORATORY DATA: Lab Results  Component Value Date   WBC 6.2 12/20/2012   HGB 11.8 12/20/2012   HCT 35.5 12/20/2012   MCV 85.3 12/20/2012   PLT 237 12/20/2012      Chemistry      Component Value Date/Time   NA 141 12/20/2012 1107   NA 138 09/01/2012 1420   K 3.7 12/20/2012 1107   K 3.7 09/01/2012 1420   CL 100 11/30/2012 1029   CL 99 09/01/2012 1420   CO2 30* 12/20/2012 1107   CO2 28 09/01/2012 1420   BUN 20.6 12/20/2012 1107   BUN 17 09/01/2012 1420   CREATININE 1.4* 12/20/2012 1107   CREATININE 1.03 09/01/2012 1420      Component Value Date/Time   CALCIUM 9.8 12/20/2012 1107   CALCIUM 9.3 09/01/2012 1420   ALKPHOS 210* 12/20/2012 1107   ALKPHOS 400* 09/01/2012 1420   AST 24 12/20/2012 1107   AST 28 09/01/2012 1420   ALT 24 12/20/2012 1107   ALT <5 09/01/2012 1420   BILITOT 0.55 12/20/2012 1107   BILITOT 0.4 09/01/2012 1420     DOB: [birth year] Age: 41 Gender: F Client Name Julie Ross. [facility] Collected Date: 12/19/2011 Received Date: 12/19/2011 Physician: Cyndia Bent Chart #: MRN # : [id number] Physician cc: Richardean Chimera, MD Sherian Rein, MD Race:W Visit #: [id number] Kaylyn Lim, RN REPORT OF SURGICAL PATHOLOGY ADDITIONAL INFORMATION: 5. PROGNOSTIC INDICATORS - ACIS Results IMMUNOHISTOCHEMICAL AND MORPHOMETRIC ANALYSIS BY THE AUTOMATED CELLULAR IMAGING SYSTEM (ACIS) Estrogen Receptor (Negative, <1%): 100%, STRONG STAINING INTENSITY Progesterone Receptor (Negative, <1%): 100%, STRONG STAINING INTENSITY Proliferation Marker Ki67 by M IB-1  (Low<20%): 11% All controls stained appropriately Pecola Leisure MD Pathologist, Electronic Signature ( Signed 12/29/2011) 5. CHROMOGENIC IN-SITU HYBRIDIZATION Interpretation: HER2/NEU BY CISH - SHOWS AMPLIFICATION BY CISH ANALYSIS. THE RATIO OF HER2: CEP 17 SIGNALS WAS 3.27 Reference range: Ratio: HER2:CEP17 < 1.8 gene amplification not observed Ratio: HER2:CEP 17 1.8-2.2 - equivocal result Ratio: HER2:CEP17 > 2.2 - gene amplification observed Pecola Leisure MD Pathologist, Electronic Signature ( Signed 12/27/2011) 1 of 4 FINAL for Ohm, Lamar M ([id number]) FINAL DIAGNOSIS Diagnosis 1. Lymph node, sentinel, biopsy, Left axillary - ONE BENIGN LYMPH NODE WITH NO TUMOR SEEN (0/1). 2. Lymph node, sentinel, biopsy, Left axilla - ONE LYMPH NODE POSITIVE FOR METASTATIC DUCTAL CARCINOMA (1/1). - SEE COMMENT. 3. Lymph node, sentinel, biopsy, Left axilla - ONE BENIGN LYMPH NODE WITH NO TUMOR SEEN (0/1). 4. Lymph node, sentinel, biopsy, Left axilla - ONE BENIGN LYMPH NODE WITH NO TUMOR SEEN (0/1). 5. Breast, simple mastectomy, Left - INVASIVE GRADE I DUCTAL CARCINOMA, TWO FOCI MEASURING 0.8 CM AND 0.6 CM ARISING IN A BACKGROUND OF EXTENSIVE INTERMEDIATE GRADE DUCTAL CARCINOMA IN SITU WITH NECROSIS. - MARGINS ARE NEGATIVE. - SEE ONCOLOGY TEMPLATE. Microscopic Comment 2. On re-review of the frozen section slides, no metastatic carcinoma is identified. The carcinoma present is on permanent sections only. 5. BREAST, INVASIVE TUMOR, WITH LYMPH NODE SAMPLING Specimen, including laterality: Left breast with sentinel lymph node. Procedure: Left  simple mastectomy with sentinel lymph node biopsy. Grade: I (Both foci of tumors show similar morphology). Tubule formation: 2. Nuclear pleomorphism: 2. Mitotic: 1. Tumor size (glass slide measurement): Two foci measuring 0.8 cm and 0.6 cm. Margins: Invasive, distance to closest margin: At least 1.5 cm. In-situ, distance to closest margin: At least  1.5 cm. Lymphovascular invasion: No definite lymph/vascular invasion is not identified however a sentinel lymph node is positive (see below). Ductal carcinoma in situ: Yes. Grade: Intermediate grade. Extensive intraductal component: Yes. Lobular neoplasia: Not identified. Tumor focality: Two foci. Treatment effect: Not applicable. Extent of tumor: Tumor confined to breast parenchyma. Lymph nodes: # examined: 4. Lymph nodes with metastasis: 1. Macrometastasis: (> 2.0 mm): 1. Extracapsular extension: Not identified. Breast prognostic profile: A breast prognostic profile will be performed (see below comment). Additional breast findings: Fibrocystic changes with usual ductal hyperplasia. TNM: mpT1b, pN1a, MX. Comment: The palpable nodular area seen grossly is comprised predominantly of intermediate grade ductal carcinoma in situ with only two foci of invasive ductal carcinoma. Both invasive foci are morphologically similar. As such a breast prognostic profile will only be performed on the larger tumor. A breast prognostic profile can be performed upon the smaller tumor per request. (RH:kh 12-21-11) 2 of 4   ASSESSMENT: 52 year old female with  #1 stage II invasive ductal carcinoma of the left breast with ductal carcinoma in situ. Patient is status post mastectomy that showed 2 foci of invasive cancer measuring 0.8 0.6 cm. The tumor is ER positive PR positive HER-2/neu amplified at 3.27. Patient is now seen in medical oncology for discussion of adjuvant treatment. As mentioned above her case was discussed at the multidisciplinary breast conference. Patient is noted to have one of 4 lymph nodes positive for metastatic disease. This was only axillary sentinel node biopsy. A discussion of whether patient under should undergo a full axillary lymph node dissection did take place but at the end of our extensive discussion it was recommended the patient will proceed with radiation and DOS she would feel  the criteria of seed 11. Therefore is excellent lymph node dissection is not being done. Instead the patient will proceed with adjuvant chemotherapy and Herceptin. Her chemotherapy will consist of Taxotere carboplatinum given every 21 days. For the duration of her chemotherapy patient will get Herceptin on a weekly basis. Once she completes the chemotherapy she then will get Herceptin every 3 weeks. After completion of chemotherapy patient will proceed with radiation therapy with concurrent Herceptin. After completion of radiation she will start Herceptin and antiestrogen therapy consisting of either tamoxifen or an aromatase inhibitor.  #2 patient has now completed radiation therapy and Herceptin combination. Her radiation completed on 06/04/2012.  #3 we will reinitiate combination chemotherapy and Herceptin on 08/03/2012. The chemotherapy will consist of Taxotere or and carboplatinum with Herceptin. The Taxotere carboplatinum was be given every 21 days to complete out 2 more cycles. Herceptin will be given on a weekly basis.  She received her last cycle on 09/21/12, and then resumed every three week Herceptin afterwards which she will continue through August.   #4 Discussed anti-estrogen therapy with Mrs. Jaworski.  She is incredibly sedentary due to her knee, therefore, Tamoxifen would not be a good choice due to the VTE risk.  We also discussed aromatase inhibitors.  Her lmp was over one year ago however she did receive chemotherapy for a few of these months.  Her FSH, LH, and estradiol confirmed she is post-menopausal.    PLAN:  #1 Ms.  Pommier is doing well.   She will proceed with Herceptin. She saw Dr. Gala Romney 12/07/12.  He did an echo and cleared her to continue Herceptin therapy.   I prescribed Letrozole daily for her to take.  We discussed it in detail.    #2 Patient will continue to see her orthopedic surgeon, Dr. Luiz Blare for her right knee.  She has seen [facility] on 6/27 and they would like to ensure she  is cancer free prior to beginning any more procedures on her knee.  We will order a CT chest/abd/pelvis.    #3 she continue receiving every 3 week Herceptin until September/October 2014 due to missing a couple of doses due to her knee infection that required hospitalization.  She will return in 3 weeks time.     All questions were answered. The patient knows to call the clinic with any problems, questions or concerns. We can certainly see the patient much sooner if necessary.  I spent 25 minutes counseling the patient face to face. The total time spent in the appointment was 30 minutes.  Cherie Ouch Lyn Hollingshead, NP Medical Oncology [facility] Phone: [phone] 12/21/2012, 9:08 AM

## 2012-12-24 ENCOUNTER — Telehealth: Payer: Self-pay | Admitting: Oncology

## 2012-12-24 NOTE — Telephone Encounter (Signed)
[facility] will contact pt w/ct appt per 7/4 pof. No other orders.

## 2012-12-28 ENCOUNTER — Ambulatory Visit (HOSPITAL_BASED_OUTPATIENT_CLINIC_OR_DEPARTMENT_OTHER): Payer: 59

## 2012-12-28 ENCOUNTER — Encounter (HOSPITAL_COMMUNITY): Payer: Self-pay

## 2012-12-28 ENCOUNTER — Ambulatory Visit (HOSPITAL_COMMUNITY)
Admission: RE | Admit: 2012-12-28 | Discharge: 2012-12-28 | Disposition: A | Discharge status: Home / Self Care | Payer: 59 | Source: Ambulatory Visit | Attending: Adult Health | Admitting: Adult Health

## 2012-12-28 VITALS — Ht 66.0 in | Wt 193.0 lb

## 2012-12-28 DIAGNOSIS — C50412 Malignant neoplasm of upper-outer quadrant of left female breast: Secondary | ICD-10-CM

## 2012-12-28 DIAGNOSIS — C50419 Malignant neoplasm of upper-outer quadrant of unspecified female breast: Secondary | ICD-10-CM | POA: Insufficient documentation

## 2012-12-28 DIAGNOSIS — C50919 Malignant neoplasm of unspecified site of unspecified female breast: Secondary | ICD-10-CM

## 2012-12-28 DIAGNOSIS — M948X9 Other specified disorders of cartilage, unspecified sites: Secondary | ICD-10-CM | POA: Insufficient documentation

## 2012-12-28 DIAGNOSIS — Z923 Personal history of irradiation: Secondary | ICD-10-CM | POA: Insufficient documentation

## 2012-12-28 DIAGNOSIS — C773 Secondary and unspecified malignant neoplasm of axilla and upper limb lymph nodes: Secondary | ICD-10-CM

## 2012-12-28 DIAGNOSIS — Z452 Encounter for adjustment and management of vascular access device: Secondary | ICD-10-CM

## 2012-12-28 DIAGNOSIS — Z9221 Personal history of antineoplastic chemotherapy: Secondary | ICD-10-CM | POA: Insufficient documentation

## 2012-12-28 MED ORDER — HEPARIN SOD (PORK) LOCK FLUSH 100 UNIT/ML IV SOLN
500.0000 [IU] | Freq: Once | INTRAVENOUS | Status: AC
  Administered 2012-12-28: 500 [IU] via INTRAVENOUS
  Filled 2012-12-28: qty 5

## 2012-12-28 MED ORDER — IOHEXOL 300 MG/ML  SOLN
100.0000 mL | Freq: Once | INTRAMUSCULAR | Status: AC | PRN
  Administered 2012-12-28: 100 mL via INTRAVENOUS

## 2012-12-28 MED ORDER — SODIUM CHLORIDE 0.9 % IJ SOLN
10.0000 mL | INTRAMUSCULAR | Status: DC | PRN
  Administered 2012-12-28: 10 mL via INTRAVENOUS
  Filled 2012-12-28: qty 10

## 2012-12-28 NOTE — Patient Instructions (Addendum)
Call MD for problems 

## 2013-01-03 ENCOUNTER — Ambulatory Visit: Payer: 59 | Admitting: Infectious Disease

## 2013-01-04 ENCOUNTER — Ambulatory Visit (HOSPITAL_BASED_OUTPATIENT_CLINIC_OR_DEPARTMENT_OTHER): Payer: 59

## 2013-01-04 ENCOUNTER — Other Ambulatory Visit: Payer: Self-pay | Admitting: *Deleted

## 2013-01-04 VITALS — BP 169/101 | HR 57 | Temp 97.6°F

## 2013-01-04 DIAGNOSIS — Z452 Encounter for adjustment and management of vascular access device: Secondary | ICD-10-CM

## 2013-01-04 DIAGNOSIS — C50419 Malignant neoplasm of upper-outer quadrant of unspecified female breast: Secondary | ICD-10-CM

## 2013-01-04 DIAGNOSIS — C50919 Malignant neoplasm of unspecified site of unspecified female breast: Secondary | ICD-10-CM

## 2013-01-04 MED ORDER — SODIUM CHLORIDE 0.9 % IJ SOLN
10.0000 mL | INTRAMUSCULAR | Status: DC | PRN
  Administered 2013-01-04: 10 mL via INTRAVENOUS
  Filled 2013-01-04: qty 10

## 2013-01-04 MED ORDER — HEPARIN SOD (PORK) LOCK FLUSH 100 UNIT/ML IV SOLN
500.0000 [IU] | Freq: Once | INTRAVENOUS | Status: AC
  Administered 2013-01-04: 250 [IU] via INTRAVENOUS
  Filled 2013-01-04: qty 5

## 2013-01-11 ENCOUNTER — Telehealth: Payer: Self-pay | Admitting: *Deleted

## 2013-01-11 ENCOUNTER — Ambulatory Visit (HOSPITAL_BASED_OUTPATIENT_CLINIC_OR_DEPARTMENT_OTHER): Payer: 59 | Admitting: Oncology

## 2013-01-11 ENCOUNTER — Ambulatory Visit (HOSPITAL_BASED_OUTPATIENT_CLINIC_OR_DEPARTMENT_OTHER): Payer: 59

## 2013-01-11 ENCOUNTER — Other Ambulatory Visit (HOSPITAL_BASED_OUTPATIENT_CLINIC_OR_DEPARTMENT_OTHER): Payer: 59 | Admitting: Lab

## 2013-01-11 VITALS — BP 173/83 | HR 54 | Temp 97.9°F | Resp 20 | Ht 66.0 in | Wt 194.8 lb

## 2013-01-11 DIAGNOSIS — C773 Secondary and unspecified malignant neoplasm of axilla and upper limb lymph nodes: Secondary | ICD-10-CM

## 2013-01-11 DIAGNOSIS — T8454XD Infection and inflammatory reaction due to internal left knee prosthesis, subsequent encounter: Secondary | ICD-10-CM

## 2013-01-11 DIAGNOSIS — C50412 Malignant neoplasm of upper-outer quadrant of left female breast: Secondary | ICD-10-CM

## 2013-01-11 DIAGNOSIS — IMO0002 Reserved for concepts with insufficient information to code with codable children: Secondary | ICD-10-CM

## 2013-01-11 DIAGNOSIS — M170 Bilateral primary osteoarthritis of knee: Secondary | ICD-10-CM

## 2013-01-11 DIAGNOSIS — Z923 Personal history of irradiation: Secondary | ICD-10-CM

## 2013-01-11 DIAGNOSIS — Z5112 Encounter for antineoplastic immunotherapy: Secondary | ICD-10-CM

## 2013-01-11 DIAGNOSIS — C50419 Malignant neoplasm of upper-outer quadrant of unspecified female breast: Secondary | ICD-10-CM

## 2013-01-11 DIAGNOSIS — Z5189 Encounter for other specified aftercare: Secondary | ICD-10-CM

## 2013-01-11 DIAGNOSIS — M1710 Unilateral primary osteoarthritis, unspecified knee: Secondary | ICD-10-CM

## 2013-01-11 LAB — CBC WITH DIFFERENTIAL/PLATELET
BASO%: 0.6 % (ref 0.0–2.0)
Basophils Absolute: 0 10*3/uL (ref 0.0–0.1)
EOS%: 6.7 % (ref 0.0–7.0)
Eosinophils Absolute: 0.4 10*3/uL (ref 0.0–0.5)
HCT: 34.8 % (ref 34.8–46.6)
HGB: 11.8 g/dL (ref 11.6–15.9)
LYMPH%: 25.5 % (ref 14.0–49.7)
MCH: 28.4 pg (ref 25.1–34.0)
MCHC: 33.9 g/dL (ref 31.5–36.0)
MCV: 83.9 fL (ref 79.5–101.0)
MONO#: 0.4 10*3/uL (ref 0.1–0.9)
MONO%: 5.9 % (ref 0.0–14.0)
NEUT#: 4 10*3/uL (ref 1.5–6.5)
NEUT%: 61.3 % (ref 38.4–76.8)
Platelets: 239 10*3/uL (ref 145–400)
RBC: 4.15 10*6/uL (ref 3.70–5.45)
RDW: 12.6 % (ref 11.2–14.5)
WBC: 6.6 10*3/uL (ref 3.9–10.3)
lymph#: 1.7 10*3/uL (ref 0.9–3.3)

## 2013-01-11 LAB — COMPREHENSIVE METABOLIC PANEL (CC13)
ALT: 18 U/L (ref 0–55)
AST: 20 U/L (ref 5–34)
Albumin: 3.6 g/dL (ref 3.5–5.0)
Alkaline Phosphatase: 151 U/L — ABNORMAL HIGH (ref 40–150)
BUN: 22.9 mg/dL (ref 7.0–26.0)
CO2: 26 mEq/L (ref 22–29)
Calcium: 9.4 mg/dL (ref 8.4–10.4)
Chloride: 101 mEq/L (ref 98–109)
Creatinine: 1.4 mg/dL — ABNORMAL HIGH (ref 0.6–1.1)
Glucose: 125 mg/dl (ref 70–140)
Potassium: 3.8 mEq/L (ref 3.5–5.1)
Sodium: 137 mEq/L (ref 136–145)
Total Bilirubin: 0.35 mg/dL (ref 0.20–1.20)
Total Protein: 6.5 g/dL (ref 6.4–8.3)

## 2013-01-11 MED ORDER — HEPARIN SOD (PORK) LOCK FLUSH 100 UNIT/ML IV SOLN
250.0000 [IU] | Freq: Once | INTRAVENOUS | Status: AC | PRN
  Administered 2013-01-11: 14:00:00
  Filled 2013-01-11: qty 5

## 2013-01-11 MED ORDER — SODIUM CHLORIDE 0.9 % IJ SOLN
10.0000 mL | INTRAMUSCULAR | Status: DC | PRN
  Administered 2013-01-11: 10 mL
  Filled 2013-01-11: qty 10

## 2013-01-11 MED ORDER — SODIUM CHLORIDE 0.9 % IV SOLN
Freq: Once | INTRAVENOUS | Status: AC
  Administered 2013-01-11: 13:00:00 via INTRAVENOUS

## 2013-01-11 MED ORDER — ACETAMINOPHEN 325 MG PO TABS
650.0000 mg | ORAL_TABLET | Freq: Once | ORAL | Status: AC
  Administered 2013-01-11: 650 mg via ORAL

## 2013-01-11 MED ORDER — TRASTUZUMAB CHEMO INJECTION 440 MG
6.0000 mg/kg | Freq: Once | INTRAVENOUS | Status: AC
  Administered 2013-01-11: 525 mg via INTRAVENOUS
  Filled 2013-01-11: qty 25

## 2013-01-11 MED ORDER — DIPHENHYDRAMINE HCL 25 MG PO CAPS
50.0000 mg | ORAL_CAPSULE | Freq: Once | ORAL | Status: AC
  Administered 2013-01-11: 50 mg via ORAL

## 2013-01-11 NOTE — Telephone Encounter (Signed)
appts was made and printed. Pt is aware that tx will be added. i emailed MW to add the tx...td

## 2013-01-11 NOTE — Patient Instructions (Signed)
[facility] Discharge Instructions for Patients Receiving Chemotherapy  Today you received the following chemotherapy agents Herceptin.      BELOW ARE SYMPTOMS THAT SHOULD BE REPORTED IMMEDIATELY:  *FEVER GREATER THAN 100.5 F  *CHILLS WITH OR WITHOUT FEVER  NAUSEA AND VOMITING THAT IS NOT CONTROLLED WITH YOUR NAUSEA MEDICATION  *UNUSUAL SHORTNESS OF BREATH  *UNUSUAL BRUISING OR BLEEDING  TENDERNESS IN MOUTH AND THROAT WITH OR WITHOUT PRESENCE OF ULCERS  *URINARY PROBLEMS  *BOWEL PROBLEMS  UNUSUAL RASH Items with * indicate a potential emergency and should be followed up as soon as possible.  Feel free to call the clinic you have any questions or concerns. The clinic phone number is [phone].    

## 2013-01-11 NOTE — Telephone Encounter (Signed)
Per staff phone call and POF I have schedueld appts.  JMW  

## 2013-01-14 ENCOUNTER — Encounter: Payer: Self-pay | Admitting: Radiation Oncology

## 2013-01-14 DIAGNOSIS — Z923 Personal history of irradiation: Secondary | ICD-10-CM | POA: Insufficient documentation

## 2013-01-17 ENCOUNTER — Ambulatory Visit: Payer: 59 | Admitting: Internal Medicine

## 2013-01-17 ENCOUNTER — Ambulatory Visit
Admission: RE | Admit: 2013-01-17 | Discharge: 2013-01-17 | Disposition: A | Discharge status: Home / Self Care | Payer: 59 | Source: Ambulatory Visit | Attending: Radiation Oncology | Admitting: Radiation Oncology

## 2013-01-17 ENCOUNTER — Encounter: Payer: Self-pay | Admitting: Radiation Oncology

## 2013-01-17 VITALS — BP 171/79 | HR 53 | Temp 98.0°F | Resp 16 | Wt 193.0 lb

## 2013-01-17 DIAGNOSIS — C50412 Malignant neoplasm of upper-outer quadrant of left female breast: Secondary | ICD-10-CM

## 2013-01-17 NOTE — Progress Notes (Signed)
Left knee brace noted. Patient ambulating with the assistance of crutches. Patient reports bilateral knee pain 8 on a scale of 0-10. Reports taking percocet 5/325 one tablet at 0930 this morning. Reports that both knees became infected due to effects of chemotherapy. Patient reports that she has three herceptin infusions left with the last one scheduled for September. Patient reports that her left chest wall has returned to normal color and appearance. Patient denies that her left chest wall is no longer itchy. Also, no edema of left arm noted. Reports fatigue.

## 2013-01-17 NOTE — Progress Notes (Signed)
Radiation Oncology         [phone] ________________________________  Name: Julie Ross MRN: [id number]  Date: 01/17/2013  DOB: Jun 08, 1961  Follow-Up Visit Note  CC: Jannifer Rodney, MD  Diagnosis:   Left breast cancer  Interval Since Last Radiation:  7  months  Narrative:  The patient returns today for routine follow-up.  She is doing recently well except for problems with her knees. She reports having to have her left knee replacement removed secondary to infection.  She is currently wearing a brace on her left knee. Patient has 3 more cycles of Herceptin left to complete her adjuvant treatment.  She denies any pain along the left chest wall area or problems with swelling in her left arm or hand.                              ALLERGIES:  has No Known Allergies.  Meds: Current Outpatient Prescriptions  Medication Sig Dispense Refill  . ALPRAZolam (XANAX) 0.5 MG tablet Take 1 tablet (0.5 mg total) by mouth 3 (three) times daily as needed for anxiety.  15 tablet  0  . amLODipine (NORVASC) 10 MG tablet Take 1 tablet (10 mg total) by mouth daily.  30 tablet  3  . aspirin EC 325 MG EC tablet Take 1 tablet (325 mg total) by mouth 2 (two) times daily.  30 tablet  0  . atenolol (TENORMIN) 25 MG tablet Take 0.5 tablets (12.5 mg total) by mouth daily. For blood pressure  15 tablet  0  . baclofen (LIORESAL) 10 MG tablet 10 mg 3 (three) times daily.       Marland Kitchen CALCIUM-MAGNESIUM PO Take 1 tablet by mouth daily.      . Diclofenac-Misoprostol 75-0.2 MG TBEC Take 75 mg by mouth Twice daily as needed (for inflammation). Take 1 by mouth twice daily as needed      . DULoxetine (CYMBALTA) 30 MG capsule Take 3 capsules (90 mg total) by mouth daily.  30 capsule  0  . esomeprazole (NEXIUM) 40 MG capsule Take 40 mg by mouth daily before breakfast.      . letrozole (FEMARA) 2.5 MG tablet Take 1 tablet (2.5 mg total) by mouth daily.  30 tablet  3  . losartan-hydrochlorothiazide (HYZAAR)  100-25 MG per tablet Take 1 tablet by mouth daily.       . Multiple Vitamins-Minerals (MULTIVITAMINS THER. W/MINERALS) TABS Take 1 tablet by mouth daily.      . OxyCODONE (OXYCONTIN) 20 mg T12A Take 1 tablet (20 mg total) by mouth every 12 (twelve) hours.  30 tablet  0  . oxyCODONE-acetaminophen (PERCOCET/ROXICET) 5-325 MG per tablet Take 1-2 tablets by mouth every 6 (six) hours as needed for pain.  60 tablet  0  . Probiotic Product (PROBIOTIC DAILY PO) Take 1 capsule by mouth daily.       Marland Kitchen ceFAZolin (ANCEF) 2-3 GM-% SOLR Inject 50 mLs (2 g total) into the vein every 8 (eight) hours.  126 each  0  . cephALEXin (KEFLEX) 500 MG capsule       . prochlorperazine (COMPAZINE) 10 MG tablet Take 1 tablet (10 mg total) by mouth every 6 (six) hours as needed.  30 tablet  0   No current facility-administered medications for this encounter.   Facility-Administered Medications Ordered in Other Encounters  Medication Dose Route Frequency Provider Last Rate Last Dose  . ceFAZolin (  ANCEF) IVPB 2 g/50 mL premix    PRN Lance Coon, CRNA   2 g at 08/18/12 1553  . dexamethasone (DECADRON) injection    PRN Lance Coon, CRNA   8 mg at 05/01/12 0908  . fentaNYL (SUBLIMAZE) injection    PRN Lance Coon, CRNA   100 mcg at 05/01/12 0856  . lidocaine (cardiac) 100 mg/35ml (XYLOCAINE) 20 MG/ML injection 2%    PRN Lance Coon, CRNA   60 mg at 05/01/12 0856  . midazolam (VERSED) 5 MG/5ML injection    PRN Lance Coon, CRNA   2 mg at 05/01/12 0856  . propofol (DIPRIVAN) 10 mg/mL bolus/IV push    PRN Lance Coon, CRNA   160 mg at 05/01/12 0900    Physical Findings: The patient is in no acute distress. Patient is alert and oriented.  weight is 193 lb (87.544 kg). Her oral temperature is 98 F (36.7 C). Her blood pressure is 171/79 and her pulse is 53. Her respiration is 16 and oxygen saturation is 100%. . No palpable supraclavicular or axillary adenopathy. The lungs are clear to auscultation. The heart has a  regular rhythm and rate. Examination of the left chest wall area reveals no palpable or visible signs recurrence.  Lab Findings: Lab Results  Component Value Date   WBC 6.6 01/11/2013   HGB 11.8 01/11/2013   HCT 34.8 01/11/2013   MCV 83.9 01/11/2013   PLT 239 01/11/2013      Radiographic Findings: Ct Chest W Contrast  12/28/2012   *RADIOLOGY REPORT*  Clinical Data:  Left breast cancer upper outer quadrant, chemotherapy completed in March 2014.  Radiation therapy last year. Restaging.  CT CHEST, ABDOMEN AND PELVIS WITH CONTRAST  Technique:  Multidetector CT imaging of the chest, abdomen and pelvis was performed following the standard protocol during bolus administration of intravenous contrast.  Contrast: OMNIPAQUE IOHEXOL 300 MG/ML  SOLN  Comparison:  Multiple exams, including 11/19/2012 and 05/01/2012  CT CHEST  Findings:  Minimal stranding in the anterior mediastinum without mass-like appearance.  No internal mammary adenopathy or other adenopathy in the chest.  Left mastectomy noted; pectoralis muscles remain. Postoperative findings, left axilla.  Post radiation therapy findings noted anteriorly in the left upper lobe.  No findings of metastatic disease to the lungs.  Tiny sclerotic bony lesions in the thoracic and lumbar spine likely reflect bone islands or similar benign lesions in light of the negative bone scan.  IMPRESSION:  1.  No findings of breast cancer recurrence or metastatic disease. 2.  Faint stranding in the anterior mediastinum likely relates to prior radiation therapy or residual/rebound thymic tissue.  This does not have a mass-like appearance.  CT ABDOMEN AND PELVIS  Findings:  The liver, spleen, pancreas, and adrenal glands appear unremarkable.  The gallbladder and biliary system appear unremarkable.  The kidneys appear unremarkable, as do the proximal ureters.  No pathologic retroperitoneal or porta hepatis adenopathy is identified.  No pathologic pelvic adenopathy is  identified.  Prominence of stool throughout the colon suggests constipation. The uterus and adnexa appear unremarkable.  Urinary bladder unremarkable.  Chronic appearing bridging sclerosis posteriorly in the right sacroiliac joint.  Nonspecific sclerotic lesion posteriorly in the left iliac bone, 0.8 x 0.5 cm on image 92 of series 2.  Several other punctate sclerotic lesions are present in the bony pelvis.  IMPRESSION:  1.  Punctate sclerotic lesions in the pelvis and spine has an appearance suggesting bone islands, query osteopoikilosis. Possibility of osteoblastic metastatic disease  is considered low for these lesions, although the left iliac lesion on image 92 of series 2 is nonspecific.  These lesions will warrant observation. I note that the recent bone scan was negative. 2.  Bridging sclerosis posteriorly in the right sacroiliac joint appears chronic.  By report the patient has a remote history of benign pelvic surgery as a child. 3. Prominence of stool throughout the colon suggests constipation.   Original Report Authenticated By: Gaylyn Rong, M.D.   Ct Abdomen Pelvis W Contrast  12/28/2012   *RADIOLOGY REPORT*  Clinical Data:  Left breast cancer upper outer quadrant, chemotherapy completed in March 2014.  Radiation therapy last year. Restaging.  CT CHEST, ABDOMEN AND PELVIS WITH CONTRAST  Technique:  Multidetector CT imaging of the chest, abdomen and pelvis was performed following the standard protocol during bolus administration of intravenous contrast.  Contrast: OMNIPAQUE IOHEXOL 300 MG/ML  SOLN  Comparison:  Multiple exams, including 11/19/2012 and 05/01/2012  CT CHEST  Findings:  Minimal stranding in the anterior mediastinum without mass-like appearance.  No internal mammary adenopathy or other adenopathy in the chest.  Left mastectomy noted; pectoralis muscles remain. Postoperative findings, left axilla.  Post radiation therapy findings noted anteriorly in the left upper lobe.  No  findings of metastatic disease to the lungs.  Tiny sclerotic bony lesions in the thoracic and lumbar spine likely reflect bone islands or similar benign lesions in light of the negative bone scan.  IMPRESSION:  1.  No findings of breast cancer recurrence or metastatic disease. 2.  Faint stranding in the anterior mediastinum likely relates to prior radiation therapy or residual/rebound thymic tissue.  This does not have a mass-like appearance.  CT ABDOMEN AND PELVIS  Findings:  The liver, spleen, pancreas, and adrenal glands appear unremarkable.  The gallbladder and biliary system appear unremarkable.  The kidneys appear unremarkable, as do the proximal ureters.  No pathologic retroperitoneal or porta hepatis adenopathy is identified.  No pathologic pelvic adenopathy is identified.  Prominence of stool throughout the colon suggests constipation. The uterus and adnexa appear unremarkable.  Urinary bladder unremarkable.  Chronic appearing bridging sclerosis posteriorly in the right sacroiliac joint.  Nonspecific sclerotic lesion posteriorly in the left iliac bone, 0.8 x 0.5 cm on image 92 of series 2.  Several other punctate sclerotic lesions are present in the bony pelvis.  IMPRESSION:  1.  Punctate sclerotic lesions in the pelvis and spine has an appearance suggesting bone islands, query osteopoikilosis. Possibility of osteoblastic metastatic disease is considered low for these lesions, although the left iliac lesion on image 92 of series 2 is nonspecific.  These lesions will warrant observation. I note that the recent bone scan was negative. 2.  Bridging sclerosis posteriorly in the right sacroiliac joint appears chronic.  By report the patient has a remote history of benign pelvic surgery as a child. 3. Prominence of stool throughout the colon suggests constipation.   Original Report Authenticated By: Gaylyn Rong, M.D.    Impression:  The patient is recovering from the effects of radiation.  No evidence of  recurrence on clinical exam today  Plan:  When necessary followup in radiation oncology. The patient will continue close followup in medical oncology.  _____________________________________  -----------------------------------  Billie Lade, PhD, MD

## 2013-01-18 ENCOUNTER — Ambulatory Visit (HOSPITAL_BASED_OUTPATIENT_CLINIC_OR_DEPARTMENT_OTHER): Payer: 59

## 2013-01-18 VITALS — BP 170/88 | HR 53 | Temp 98.0°F | Resp 20

## 2013-01-18 DIAGNOSIS — C50419 Malignant neoplasm of upper-outer quadrant of unspecified female breast: Secondary | ICD-10-CM

## 2013-01-18 DIAGNOSIS — Z5189 Encounter for other specified aftercare: Secondary | ICD-10-CM

## 2013-01-18 MED ORDER — HEPARIN SOD (PORK) LOCK FLUSH 100 UNIT/ML IV SOLN
250.0000 [IU] | Freq: Once | INTRAVENOUS | Status: AC
  Administered 2013-01-18: 250 [IU] via INTRAVENOUS
  Filled 2013-01-18: qty 5

## 2013-01-18 MED ORDER — SODIUM CHLORIDE 0.9 % IJ SOLN
10.0000 mL | Freq: Once | INTRAMUSCULAR | Status: AC
  Administered 2013-01-18: 10 mL
  Filled 2013-01-18: qty 10

## 2013-01-22 ENCOUNTER — Ambulatory Visit (HOSPITAL_COMMUNITY): Payer: 59

## 2013-01-23 ENCOUNTER — Encounter: Payer: Self-pay | Admitting: Adult Health

## 2013-01-24 ENCOUNTER — Other Ambulatory Visit: Payer: Self-pay | Admitting: *Deleted

## 2013-01-24 DIAGNOSIS — C50419 Malignant neoplasm of upper-outer quadrant of unspecified female breast: Secondary | ICD-10-CM

## 2013-01-24 MED ORDER — HEPARIN SOD (PORK) LOCK FLUSH 100 UNIT/ML IV SOLN: INTRAVENOUS | Status: DC

## 2013-01-24 NOTE — Telephone Encounter (Signed)
Patient sent MyChart message requesting more Heparin for [facility].I.[facility].  Called her and she says she has plenty saline but needs heparin order sent to the [facility]

## 2013-01-25 ENCOUNTER — Ambulatory Visit (HOSPITAL_BASED_OUTPATIENT_CLINIC_OR_DEPARTMENT_OTHER): Payer: 59

## 2013-01-25 VITALS — BP 174/90 | HR 58 | Temp 97.2°F | Resp 16

## 2013-01-25 DIAGNOSIS — C50919 Malignant neoplasm of unspecified site of unspecified female breast: Secondary | ICD-10-CM

## 2013-01-25 DIAGNOSIS — Z452 Encounter for adjustment and management of vascular access device: Secondary | ICD-10-CM

## 2013-01-25 DIAGNOSIS — C50319 Malignant neoplasm of lower-inner quadrant of unspecified female breast: Secondary | ICD-10-CM

## 2013-01-25 MED ORDER — SODIUM CHLORIDE 0.9 % IJ SOLN
10.0000 mL | INTRAMUSCULAR | Status: DC | PRN
  Administered 2013-01-25: 10 mL via INTRAVENOUS
  Filled 2013-01-25: qty 10

## 2013-01-25 MED ORDER — HEPARIN SOD (PORK) LOCK FLUSH 100 UNIT/ML IV SOLN
500.0000 [IU] | Freq: Once | INTRAVENOUS | Status: AC
  Administered 2013-01-25: 500 [IU] via INTRAVENOUS
  Filled 2013-01-25: qty 5

## 2013-01-25 NOTE — Progress Notes (Signed)
Dressing change to RUA PICC, site looks intact.

## 2013-01-27 NOTE — Progress Notes (Signed)
OFFICE PROGRESS NOTE  CC  Julie Ross [address] [place] [address] Dr. Cyndia Bent Dr. Richardean Chimera Dr. Mardella Layman Dr. Gustavus Messing ([facility])  DIAGNOSIS: 52 year old female who originally was seen in the multidisciplinary breast clinic for discussion of new diagnosis of left breast cancer. She is now status post left mastectomy with the final pathology revealing an invasive ductal carcinoma that is ER positive PR positive HER-2/neu positive with Ki-67 11%.  PRIOR THERAPY:  #1 patient was originally seen in the multidisciplinary breast clinic on 11/30/2011 for what at that time looked like a ductal carcinoma in situ of the left breast. Due to the extent of disease she was recommended a mastectomy with axillary lymph node sampling.  #2 12/19/2011 patient had a left mastectomy with sentinel lymph node biopsy. Her final pathology revealed 2 foci of invasive ductal carcinoma measuring 0.8 and 0.6 cm. The tumor was low grade ER positive PR positive. Patient's tumor was found to be HER-2/neu positive with HER-2 ratio of 3.27. Ki-67 was 11%. Patient had sentinel lymph node  Biopsies performed one of 4 lymph nodes were positive for metastatic disease. Patient's final pathologic staging is T1 N1 M0. Stage II  #3 patient has received adjuvant chemotherapy consisting of Taxotere carboplatinum and Herceptin for 2 cycles starting in 01/2012. This was then complicated by development of bacteremia left knee sepsis requiring revision of the knee. Port-A-Cath site infection requiring removal of the Port-A-Cath and placement of a PICC line.  #4 patient then received Herceptin every 3 weeks with radiation therapy  From 10/ 29 through 06/04/12 while she finished up her antibiotics.  #5 patient completed Taxotere carboplatinum and Herceptin combination to finish out her last 2 cycles. We discussed the risks and benefits of this again. This was started on  08/03/12, and she finished two cycles.  The second cycle was delayed due to another incidence of left knee septic arthritis.  Her hardware was removed, she was placed on IV antibiotics, and was cleared by infectious disease to finish her last cycle of [facility] which she did on 09/21/12.    #7 She resumed every three week Herceptin on 10/19/12.   #8Letrozole 2.5 mg daily  CURRENT THERAPY: Every 3 week Herceptin  INTERVAL HISTORY: Julie Ross 52 y.o. female returns for Followup visit.    She is very relieved with these results.  She saw a specialist at [facility] regarding her knee and the previously infected hardware.  They would like to ensure she is cancer free and wont need any chemotherapy in the near future due to the fact that they have one chance to replace the hardware.  If that is unsuccessful she may require a right AKA.  They plan on doing an arthrocentesis with culture to ensure their is no current infection in the knee before surgery.  She is doing well otherwise.  She has not had any further suicidal ideation.  Otherwise, a 10 point ROS is negative.     MEDICAL HISTORY: Past Medical History  Diagnosis Date  . Hypertension   . Hypothyroidism   . Arthritis   . Neuromuscular disorder     MS TX WITH CYMBALTA   . Multiple sclerosis   . H/O colonoscopy   . H/O bone density study 03/2011  . Wears glasses   . Heart burn   . Depression   . Sleep apnea     MILD SLEEP APNEA, NO MACHINE  6 YRS AGO HPT  REGIONAL   . Breast cancer 11/2011    lul/ER+PR+  . Hx of radiation therapy 04/17/12- 06/04/12    left chest wall, axilla, L supraclavicular fossa 4500 cGy, left mastectomy scar/chest wall 5940 cGy    ALLERGIES:  has No Known Allergies.  MEDICATIONS:  Current Outpatient Prescriptions  Medication Sig Dispense Refill  . ALPRAZolam (XANAX) 0.5 MG tablet Take 1 tablet (0.5 mg total) by mouth 3 (three) times daily as needed for anxiety.  15 tablet  0  . amLODipine (NORVASC) 10 MG tablet Take 1  tablet (10 mg total) by mouth daily.  30 tablet  3  . aspirin EC 325 MG EC tablet Take 1 tablet (325 mg total) by mouth 2 (two) times daily.  30 tablet  0  . atenolol (TENORMIN) 25 MG tablet Take 0.5 tablets (12.5 mg total) by mouth daily. For blood pressure  15 tablet  0  . baclofen (LIORESAL) 10 MG tablet 10 mg 3 (three) times daily.       Marland Kitchen CALCIUM-MAGNESIUM PO Take 1 tablet by mouth daily.      . Diclofenac-Misoprostol 75-0.2 MG TBEC Take 75 mg by mouth Twice daily as needed (for inflammation). Take 1 by mouth twice daily as needed      . DULoxetine (CYMBALTA) 30 MG capsule Take 3 capsules (90 mg total) by mouth daily.  30 capsule  0  . esomeprazole (NEXIUM) 40 MG capsule Take 40 mg by mouth daily before breakfast.      . letrozole (FEMARA) 2.5 MG tablet Take 1 tablet (2.5 mg total) by mouth daily.  30 tablet  3  . losartan-hydrochlorothiazide (HYZAAR) 100-25 MG per tablet Take 1 tablet by mouth daily.       . Multiple Vitamins-Minerals (MULTIVITAMINS THER. W/MINERALS) TABS Take 1 tablet by mouth daily.      . OxyCODONE (OXYCONTIN) 20 mg T12A Take 1 tablet (20 mg total) by mouth every 12 (twelve) hours.  30 tablet  0  . oxyCODONE-acetaminophen (PERCOCET/ROXICET) 5-325 MG per tablet Take 1-2 tablets by mouth every 6 (six) hours as needed for pain.  60 tablet  0  . Probiotic Product (PROBIOTIC DAILY PO) Take 1 capsule by mouth daily.       . prochlorperazine (COMPAZINE) 10 MG tablet Take 1 tablet (10 mg total) by mouth every 6 (six) hours as needed.  30 tablet  0  . ceFAZolin (ANCEF) 2-3 GM-% SOLR Inject 50 mLs (2 g total) into the vein every 8 (eight) hours.  126 each  0  . cephALEXin (KEFLEX) 500 MG capsule       . heparin lock flush 100 UNIT/ML SOLN injection Inject 2.5 ml (250 units) total into P.I.C.C. Lumen daily or as directed.  30 Syringe  1   No current facility-administered medications for this visit.   Facility-Administered Medications Ordered in Other Visits  Medication Dose Route  Frequency Provider Last Rate Last Dose  . ceFAZolin (ANCEF) IVPB 2 g/50 mL premix    PRN Lance Coon, CRNA   2 g at 08/18/12 1553  . dexamethasone (DECADRON) injection    PRN Lance Coon, CRNA   8 mg at 05/01/12 0908  . fentaNYL (SUBLIMAZE) injection    PRN Lance Coon, CRNA   100 mcg at 05/01/12 0856  . lidocaine (cardiac) 100 mg/75ml (XYLOCAINE) 20 MG/ML injection 2%    PRN Lance Coon, CRNA   60 mg at 05/01/12 0856  . midazolam (VERSED) 5 MG/5ML injection    PRN Lance Coon,  CRNA   2 mg at 05/01/12 0856  . propofol (DIPRIVAN) 10 mg/mL bolus/IV push    PRN Lance Coon, CRNA   160 mg at 05/01/12 0900    SURGICAL HISTORY:  Past Surgical History  Procedure Laterality Date  . Tonsillectomy    . Tumor removal      FROM PELVIS AGE 36  . Cesarean section      X2   . No past surgeries      BLADDER SLING  4-5 YRS AGO   . Knee arthroscopy      LT KNEE 04/2011  . Total knee arthroplasty  08/15/2011    Procedure: TOTAL KNEE ARTHROPLASTY; lft Surgeon: Harvie Junior, MD;  Location: [facility] OR;  Service: Orthopedics;  Laterality: Left;  RIGHT KNEE CORTIZONE INJECTION  . Mohs surgery      right face  . Knee arthroscopy  82    rt  . Joint replacement  Feb 2013  . Mastectomy w/ sentinel node biopsy  12/19/2011    Procedure: MASTECTOMY WITH SENTINEL LYMPH NODE BIOPSY;  Surgeon: Currie Paris, MD;  Location: [facility] OR;  Service: General;  Laterality: Left;  left breast and left axilla   . Portacath placement  01/16/2012    Procedure: INSERTION PORT-A-CATH;  Surgeon: Currie Paris, MD;  Location: [facility];  Service: General;  Laterality: Right;  Porta Cath Placement   . I&d knee with poly exchange  03/02/2012    Procedure: IRRIGATION AND DEBRIDEMENT KNEE WITH POLY EXCHANGE;  Surgeon: Harvie Junior, MD;  Location: [facility] OR;  Service: Orthopedics;  Laterality: Left;  I&D of total knee with Possible Poly Exchange   . Tee without cardioversion  03/06/2012    Procedure:  TRANSESOPHAGEAL ECHOCARDIOGRAM (TEE);  Surgeon: Wendall Stade, MD;  Location: [facility] ENDOSCOPY;  Service: Cardiovascular;  Laterality: N/A;  Rm. [id number]  . Port-a-cath removal  03/06/2012    Procedure: REMOVAL PORT-A-CATH;  Surgeon: Adolph Pollack, MD;  Location: [facility] OR;  Service: General;  Laterality: N/A;  . Portacath placement  05/01/2012    Procedure: INSERTION PORT-A-CATH;  Surgeon: Currie Paris, MD;  Location: [facility];  Service: General;  Laterality: Right;  right internal jugular port-a-cath insertion  . Total knee arthroplasty Left 08/18/2012    Procedure:  Irrigation and debridement of LEFT total knee;  Removal of total knee parts; Implant of spacers;  Surgeon: Nestor Lewandowsky, MD;  Location: [facility] OR;  Service: Orthopedics;  Laterality: Left;  . Port-a-cath removal Right 08/22/2012    Procedure: REMOVAL PORT-A-CATH;  Surgeon: Mariella Saa, MD;  Location: [facility] OR;  Service: General;  Laterality: Right;    REVIEW OF SYSTEMS:   General: fatigue (+), night sweats (-), fever (-), pain (+) Lymph: palpable nodes (-) HEENT: vision changes (-), mucositis (-), gum bleeding (-), epistaxis (-) Cardiovascular: chest pain (-), palpitations (-) Pulmonary: shortness of breath (-), dyspnea on exertion (-), cough (-), hemoptysis (-) GI:  Early satiety (-), melena (-), dysphagia (-), nausea/vomiting (-), diarrhea (-) GU: dysuria (-), hematuria (-), incontinence (-) Musculoskeletal: joint swelling (-), joint pain (-), back pain (-) Neuro: weakness (-), numbness (-), headache (-), confusion (-) Skin: Rash (-), lesions (-), dryness (-) Psych: depression (+), suicidal/homicidal ideation (+), feeling of hopelessness (-)   PHYSICAL EXAMINATION:  BP 173/83  Pulse 54  Temp(Src) 97.9 F (36.6 C) (Oral)  Resp 20  Ht 5\' 6"  (1.676 Ross)  Wt 194 lb 12.8 oz (88.361 kg)  BMI 31.46 kg/m2  LMP 12/05/2011 General: Patient is a well appearing female in no acute distress HEENT: PERRLA, sclerae  anicteric no conjunctival pallor, MMM Neck: supple, no palpable adenopathy Lungs: clear to auscultation bilaterally, no wheezes, rhonchi, or rales Cardiovascular: regular rate rhythm, S1, S2, no murmurs, rubs or gallops Abdomen: Soft, non-tender, non-distended, normoactive bowel sounds, no HSM Extremities: warm and well perfused, no clubbing, cyanosis, or edema Skin: No rashes or lesions Neuro: Non-focal Breasts: left chest wall mastectomy site no nodularity, erythematous from radiation.   Tenderness on right clavicle and first and second ribs.  ECOG PERFORMANCE STATUS: 1 - Symptomatic but completely ambulatory  LABORATORY DATA: Lab Results  Component Value Date   WBC 6.6 01/11/2013   HGB 11.8 01/11/2013   HCT 34.8 01/11/2013   MCV 83.9 01/11/2013   PLT 239 01/11/2013      Chemistry      Component Value Date/Time   NA 137 01/11/2013 1054   NA 138 09/01/2012 1420   K 3.8 01/11/2013 1054   K 3.7 09/01/2012 1420   CL 100 11/30/2012 1029   CL 99 09/01/2012 1420   CO2 26 01/11/2013 1054   CO2 28 09/01/2012 1420   BUN 22.9 01/11/2013 1054   BUN 17 09/01/2012 1420   CREATININE 1.4* 01/11/2013 1054   CREATININE 1.03 09/01/2012 1420      Component Value Date/Time   CALCIUM 9.4 01/11/2013 1054   CALCIUM 9.3 09/01/2012 1420   ALKPHOS 151* 01/11/2013 1054   ALKPHOS 400* 09/01/2012 1420   AST 20 01/11/2013 1054   AST 28 09/01/2012 1420   ALT 18 01/11/2013 1054   ALT <5 09/01/2012 1420   BILITOT 0.35 01/11/2013 1054   BILITOT 0.4 09/01/2012 1420     DOB: Nov 30, 1960 Age: 1 Gender: F Client Name [place] Collected Date: 12/19/2011 Received Date: 12/19/2011 Physician: Cyndia Bent Chart #: MRN # : [id number] Physician cc: Richardean Chimera, MD Julie Rein, MD Race:W Visit #: [id number] Kaylyn Lim, RN REPORT OF SURGICAL PATHOLOGY ADDITIONAL INFORMATION: 5. PROGNOSTIC INDICATORS - ACIS Results IMMUNOHISTOCHEMICAL AND MORPHOMETRIC ANALYSIS BY THE AUTOMATED CELLULAR IMAGING  SYSTEM (ACIS) Estrogen Receptor (Negative, <1%): 100%, STRONG STAINING INTENSITY Progesterone Receptor (Negative, <1%): 100%, STRONG STAINING INTENSITY Proliferation Marker Ki67 by Ross IB-1 (Low<20%): 11% All controls stained appropriately Pecola Leisure MD Pathologist, Electronic Signature ( Signed 12/29/2011) 5. CHROMOGENIC IN-SITU HYBRIDIZATION Interpretation: HER2/NEU BY CISH - SHOWS AMPLIFICATION BY CISH ANALYSIS. THE RATIO OF HER2: CEP 17 SIGNALS WAS 3.27 Reference range: Ratio: HER2:CEP17 < 1.8 gene amplification not observed Ratio: HER2:CEP 17 1.8-2.2 - equivocal result Ratio: HER2:CEP17 > 2.2 - gene amplification observed Pecola Leisure MD Pathologist, Electronic Signature ( Signed 12/27/2011) 1 of 4 FINAL for Julie Ross, Julie Ross ([id number]) FINAL DIAGNOSIS Diagnosis 1. Lymph node, sentinel, biopsy, Left axillary - ONE BENIGN LYMPH NODE WITH NO TUMOR SEEN (0/1). 2. Lymph node, sentinel, biopsy, Left axilla - ONE LYMPH NODE POSITIVE FOR METASTATIC DUCTAL CARCINOMA (1/1). - SEE COMMENT. 3. Lymph node, sentinel, biopsy, Left axilla - ONE BENIGN LYMPH NODE WITH NO TUMOR SEEN (0/1). 4. Lymph node, sentinel, biopsy, Left axilla - ONE BENIGN LYMPH NODE WITH NO TUMOR SEEN (0/1). 5. Breast, simple mastectomy, Left - INVASIVE GRADE I DUCTAL CARCINOMA, TWO FOCI MEASURING 0.8 CM AND 0.6 CM ARISING IN A BACKGROUND OF EXTENSIVE INTERMEDIATE GRADE DUCTAL CARCINOMA IN SITU WITH NECROSIS. - MARGINS ARE NEGATIVE. - SEE ONCOLOGY TEMPLATE. Microscopic Comment 2. On re-review of the frozen section slides, no metastatic carcinoma  is identified. The carcinoma present is on permanent sections only. 5. BREAST, INVASIVE TUMOR, WITH LYMPH NODE SAMPLING Specimen, including laterality: Left breast with sentinel lymph node. Procedure: Left simple mastectomy with sentinel lymph node biopsy. Grade: I (Both foci of tumors show similar morphology). Tubule formation: 2. Nuclear pleomorphism: 2. Mitotic:  1. Tumor size (glass slide measurement): Two foci measuring 0.8 cm and 0.6 cm. Margins: Invasive, distance to closest margin: At least 1.5 cm. In-situ, distance to closest margin: At least 1.5 cm. Lymphovascular invasion: No definite lymph/vascular invasion is not identified however a sentinel lymph node is positive (see below). Ductal carcinoma in situ: Yes. Grade: Intermediate grade. Extensive intraductal component: Yes. Lobular neoplasia: Not identified. Tumor focality: Two foci. Treatment effect: Not applicable. Extent of tumor: Tumor confined to breast parenchyma. Lymph nodes: # examined: 4. Lymph nodes with metastasis: 1. Macrometastasis: (> 2.0 mm): 1. Extracapsular extension: Not identified. Breast prognostic profile: A breast prognostic profile will be performed (see below comment). Additional breast findings: Fibrocystic changes with usual ductal hyperplasia. TNM: mpT1b, pN1a, MX. Comment: The palpable nodular area seen grossly is comprised predominantly of intermediate grade ductal carcinoma in situ with only two foci of invasive ductal carcinoma. Both invasive foci are morphologically similar. As such a breast prognostic profile will only be performed on the larger tumor. A breast prognostic profile can be performed upon the smaller tumor per request. (RH:kh 12-21-11) 2 of 4   ASSESSMENT: 52 year old female with  #1 stage II invasive ductal carcinoma of the left breast with ductal carcinoma in situ. Patient is status post mastectomy that showed 2 foci of invasive cancer measuring 0.8 0.6 cm. The tumor is ER positive PR positive HER-2/neu amplified at 3.27. Patient is now seen in medical oncology for discussion of adjuvant treatment. As mentioned above her case was discussed at the multidisciplinary breast conference. Patient is noted to have one of 4 lymph nodes positive for metastatic disease. This was only axillary sentinel node biopsy. A discussion of whether patient under  should undergo a full axillary lymph node dissection did take place but at the end of our extensive discussion it was recommended the patient will proceed with radiation and DOS she would feel the criteria of seed 11. Therefore is excellent lymph node dissection is not being done. Instead the patient will proceed with adjuvant chemotherapy and Herceptin. Her chemotherapy will consist of Taxotere carboplatinum given every 21 days. For the duration of her chemotherapy patient will get Herceptin on a weekly basis. Once she completes the chemotherapy she then will get Herceptin every 3 weeks. After completion of chemotherapy patient will proceed with radiation therapy with concurrent Herceptin. After completion of radiation she will start Herceptin and antiestrogen therapy consisting of either tamoxifen or an aromatase inhibitor.  #2 patient has now completed radiation therapy and Herceptin combination. Her radiation completed on 06/04/2012.  #3 we will reinitiate combination chemotherapy and Herceptin on 08/03/2012. The chemotherapy will consist of Taxotere or and carboplatinum with Herceptin. The Taxotere carboplatinum was be given every 21 days to complete out 2 more cycles. Herceptin will be given on a weekly basis.  She received her last cycle on 09/21/12, and then resumed every three week Herceptin afterwards which she will continue through August.   #4 Discussed anti-estrogen therapy with Mrs. Hafer.  She is incredibly sedentary due to her knee, therefore, Tamoxifen would not be a good choice due to the VTE risk.  We also discussed aromatase inhibitors.  Her lmp was over one year  ago however she did receive chemotherapy for a few of these months.  Her FSH, LH, and estradiol confirmed she is post-menopausal.    PLAN:  #1 Ms. Everding is doing well.   She will proceed with Herceptin.    continue Letrozole daily   #2 Patient will continue to see her orthopedic surgeon, Dr. Luiz Blare for her right knee.  She has  seen [facility] on 6/27 and they would like to ensure she is cancer free prior to beginning any more procedures on her knee.  We will order a CT chest/abd/pelvis.    #3 she continue receiving every 3 week Herceptin until September/October 2014 due to missing a couple of doses due to her knee infection that required hospitalization.  She will return in 3 weeks time.     All questions were answered. The patient knows to call the clinic with any problems, questions or concerns. We can certainly see the patient much sooner if necessary.  I spent 25 minutes counseling the patient face to face. The total time spent in the appointment was 30 minutes.  Drue Second, MD Medical/Oncology [facility] [phone] (beeper) [phone] (Office)

## 2013-01-28 ENCOUNTER — Encounter: Payer: Self-pay | Admitting: Oncology

## 2013-01-28 NOTE — Progress Notes (Signed)
Put disability questionaire on nurse's desk.

## 2013-02-01 ENCOUNTER — Ambulatory Visit (HOSPITAL_BASED_OUTPATIENT_CLINIC_OR_DEPARTMENT_OTHER): Payer: 59

## 2013-02-01 ENCOUNTER — Encounter: Payer: Self-pay | Admitting: Oncology

## 2013-02-01 ENCOUNTER — Ambulatory Visit (HOSPITAL_BASED_OUTPATIENT_CLINIC_OR_DEPARTMENT_OTHER): Payer: 59 | Admitting: Adult Health

## 2013-02-01 ENCOUNTER — Encounter: Payer: Self-pay | Admitting: Adult Health

## 2013-02-01 ENCOUNTER — Other Ambulatory Visit (HOSPITAL_BASED_OUTPATIENT_CLINIC_OR_DEPARTMENT_OTHER): Payer: 59 | Admitting: Lab

## 2013-02-01 ENCOUNTER — Telehealth: Payer: Self-pay | Admitting: *Deleted

## 2013-02-01 VITALS — BP 159/90 | HR 68 | Temp 97.9°F | Resp 20 | Ht 66.0 in | Wt 194.8 lb

## 2013-02-01 DIAGNOSIS — C50412 Malignant neoplasm of upper-outer quadrant of left female breast: Secondary | ICD-10-CM

## 2013-02-01 DIAGNOSIS — Z170 Estrogen receptor positive status [ER+]: Secondary | ICD-10-CM

## 2013-02-01 DIAGNOSIS — C773 Secondary and unspecified malignant neoplasm of axilla and upper limb lymph nodes: Secondary | ICD-10-CM

## 2013-02-01 DIAGNOSIS — C50419 Malignant neoplasm of upper-outer quadrant of unspecified female breast: Secondary | ICD-10-CM

## 2013-02-01 DIAGNOSIS — Z5112 Encounter for antineoplastic immunotherapy: Secondary | ICD-10-CM

## 2013-02-01 LAB — COMPREHENSIVE METABOLIC PANEL (CC13)
ALT: 39 U/L (ref 0–55)
AST: 32 U/L (ref 5–34)
Albumin: 3.7 g/dL (ref 3.5–5.0)
Alkaline Phosphatase: 185 U/L — ABNORMAL HIGH (ref 40–150)
BUN: 21 mg/dL (ref 7.0–26.0)
CO2: 26 mEq/L (ref 22–29)
Calcium: 9.9 mg/dL (ref 8.4–10.4)
Chloride: 102 mEq/L (ref 98–109)
Creatinine: 1.3 mg/dL — ABNORMAL HIGH (ref 0.6–1.1)
Glucose: 132 mg/dl (ref 70–140)
Potassium: 3.5 mEq/L (ref 3.5–5.1)
Sodium: 141 mEq/L (ref 136–145)
Total Bilirubin: 0.63 mg/dL (ref 0.20–1.20)
Total Protein: 7.1 g/dL (ref 6.4–8.3)

## 2013-02-01 LAB — CBC WITH DIFFERENTIAL/PLATELET
BASO%: 0.8 % (ref 0.0–2.0)
Basophils Absolute: 0.1 10*3/uL (ref 0.0–0.1)
EOS%: 5.5 % (ref 0.0–7.0)
Eosinophils Absolute: 0.4 10*3/uL (ref 0.0–0.5)
HCT: 36.6 % (ref 34.8–46.6)
HGB: 12.3 g/dL (ref 11.6–15.9)
LYMPH%: 22.8 % (ref 14.0–49.7)
MCH: 28.4 pg (ref 25.1–34.0)
MCHC: 33.6 g/dL (ref 31.5–36.0)
MCV: 84.5 fL (ref 79.5–101.0)
MONO#: 0.3 10*3/uL (ref 0.1–0.9)
MONO%: 5.2 % (ref 0.0–14.0)
NEUT#: 4.2 10*3/uL (ref 1.5–6.5)
NEUT%: 65.7 % (ref 38.4–76.8)
Platelets: 246 10*3/uL (ref 145–400)
RBC: 4.33 10*6/uL (ref 3.70–5.45)
RDW: 13.1 % (ref 11.2–14.5)
WBC: 6.4 10*3/uL (ref 3.9–10.3)
lymph#: 1.5 10*3/uL (ref 0.9–3.3)
nRBC: 0 % (ref 0–0)

## 2013-02-01 MED ORDER — DIPHENHYDRAMINE HCL 25 MG PO CAPS
50.0000 mg | ORAL_CAPSULE | Freq: Once | ORAL | Status: AC
  Administered 2013-02-01: 50 mg via ORAL

## 2013-02-01 MED ORDER — HEPARIN SOD (PORK) LOCK FLUSH 100 UNIT/ML IV SOLN
500.0000 [IU] | Freq: Once | INTRAVENOUS | Status: DC | PRN
  Filled 2013-02-01: qty 5

## 2013-02-01 MED ORDER — ACETAMINOPHEN 325 MG PO TABS
650.0000 mg | ORAL_TABLET | Freq: Once | ORAL | Status: AC
  Administered 2013-02-01: 650 mg via ORAL

## 2013-02-01 MED ORDER — TRASTUZUMAB CHEMO INJECTION 440 MG
6.0000 mg/kg | Freq: Once | INTRAVENOUS | Status: AC
  Administered 2013-02-01: 525 mg via INTRAVENOUS
  Filled 2013-02-01: qty 25

## 2013-02-01 MED ORDER — HEPARIN SOD (PORK) LOCK FLUSH 100 UNIT/ML IV SOLN
250.0000 [IU] | Freq: Once | INTRAVENOUS | Status: AC | PRN
  Administered 2013-02-01: 250 [IU]
  Filled 2013-02-01: qty 5

## 2013-02-01 MED ORDER — SODIUM CHLORIDE 0.9 % IV SOLN
Freq: Once | INTRAVENOUS | Status: AC
  Administered 2013-02-01: 12:00:00 via INTRAVENOUS

## 2013-02-01 MED ORDER — SODIUM CHLORIDE 0.9 % IJ SOLN
10.0000 mL | INTRAMUSCULAR | Status: DC | PRN
  Administered 2013-02-01: 10 mL
  Filled 2013-02-01: qty 10

## 2013-02-01 NOTE — Patient Instructions (Signed)
[facility] Discharge Instructions for Patients Receiving Chemotherapy  Today you received the following chemotherapy agents Herceptin.  To help prevent nausea and vomiting after your treatment, we encourage you to take your nausea medication as prescribed.   If you develop nausea and vomiting that is not controlled by your nausea medication, call the clinic.   BELOW ARE SYMPTOMS THAT SHOULD BE REPORTED IMMEDIATELY:  *FEVER GREATER THAN 100.5 F  *CHILLS WITH OR WITHOUT FEVER  NAUSEA AND VOMITING THAT IS NOT CONTROLLED WITH YOUR NAUSEA MEDICATION  *UNUSUAL SHORTNESS OF BREATH  *UNUSUAL BRUISING OR BLEEDING  TENDERNESS IN MOUTH AND THROAT WITH OR WITHOUT PRESENCE OF ULCERS  *URINARY PROBLEMS  *BOWEL PROBLEMS  UNUSUAL RASH Items with * indicate a potential emergency and should be followed up as soon as possible.  Feel free to call the clinic you have any questions or concerns. The clinic phone number is [phone].    

## 2013-02-01 NOTE — Progress Notes (Signed)
OFFICE PROGRESS NOTE  CC  [name] [address] [place] [address] Dr. Cyndia Bent Dr. Richardean Chimera Dr. Mardella Layman Dr. Gustavus Messing ([facility])  DIAGNOSIS: 52 year old female who originally was seen in the multidisciplinary breast clinic for discussion of new diagnosis of left breast cancer. She is now status post left mastectomy with the final pathology revealing an invasive ductal carcinoma that is ER positive PR positive HER-2/neu positive with Ki-67 11%.  PRIOR THERAPY:  #1 patient was originally seen in the multidisciplinary breast clinic on 11/30/2011 for what at that time looked like a ductal carcinoma in situ of the left breast. Due to the extent of disease she was recommended a mastectomy with axillary lymph node sampling.  #2 12/19/2011 patient had a left mastectomy with sentinel lymph node biopsy. Her final pathology revealed 2 foci of invasive ductal carcinoma measuring 0.8 and 0.6 cm. The tumor was low grade ER positive PR positive. Patient's tumor was found to be HER-2/neu positive with HER-2 ratio of 3.27. Ki-67 was 11%. Patient had sentinel lymph node  Biopsies performed one of 4 lymph nodes were positive for metastatic disease. Patient's final pathologic staging is T1 N1 M0. Stage II  #3 patient has received adjuvant chemotherapy consisting of Taxotere carboplatinum and Herceptin for 2 cycles starting in 01/2012. This was then complicated by development of bacteremia left knee sepsis requiring revision of the knee. Port-A-Cath site infection requiring removal of the Port-A-Cath and placement of a PICC line.  #4 patient then received Herceptin every 3 weeks with radiation therapy  From 10/ 29 through 06/04/12 while she finished up her antibiotics.  #5 patient completed Taxotere carboplatinum and Herceptin combination to finish out her last 2 cycles. We discussed the risks and benefits of this again. This was started on  08/03/12, and she finished two cycles.  The second cycle was delayed due to another incidence of left knee septic arthritis.  Her hardware was removed, she was placed on IV antibiotics, and was cleared by infectious disease to finish her last cycle of [facility] which she did on 09/21/12.    #7 She resumed every three week Herceptin on 10/19/12.   #8Letrozole 2.5 mg daily  CURRENT THERAPY: Every 3 week Herceptin  INTERVAL HISTORY: Lysandra Loughmiller Jafri 52 y.o. female returns for Followup visit.    She ahd a ct chest abd pelvis on 7/11 that was negative for metastatic disease.  She denies chest pain, palpitations, orthopnea.  She is taking the Letrozole daily and tolerating it well.  She denies hot flashes, worsening joint aches/pains, dryness or any other concerns.  She is doing well.  A 10 point ROS is negative.    MEDICAL HISTORY: Past Medical History  Diagnosis Date  . Hypertension   . Hypothyroidism   . Arthritis   . Neuromuscular disorder     MS TX WITH CYMBALTA   . Multiple sclerosis   . H/O colonoscopy   . H/O bone density study 03/2011  . Wears glasses   . Heart burn   . Depression   . Sleep apnea     MILD SLEEP APNEA, NO MACHINE  6 YRS AGO HPT [facility]   . Breast cancer 11/2011    lul/ER+PR+  . Hx of radiation therapy 04/17/12- 06/04/12    left chest wall, axilla, L supraclavicular fossa 4500 cGy, left mastectomy scar/chest wall 5940 cGy    ALLERGIES:  has No Known Allergies.  MEDICATIONS:  Current Outpatient Prescriptions  Medication Sig Dispense Refill  . ALPRAZolam (XANAX) 0.5 MG tablet Take 1 tablet (0.5 mg total) by mouth 3 (three) times daily as needed for anxiety.  15 tablet  0  . amLODipine (NORVASC) 10 MG tablet Take 1 tablet (10 mg total) by mouth daily.  30 tablet  3  . aspirin EC 325 MG EC tablet Take 1 tablet (325 mg total) by mouth 2 (two) times daily.  30 tablet  0  . atenolol (TENORMIN) 25 MG tablet Take 0.5 tablets (12.5 mg total) by mouth daily. For blood pressure  15  tablet  0  . baclofen (LIORESAL) 10 MG tablet 10 mg 3 (three) times daily.       Marland Kitchen CALCIUM-MAGNESIUM PO Take 1 tablet by mouth daily.      Marland Kitchen ceFAZolin (ANCEF) 2-3 GM-% SOLR Inject 50 mLs (2 g total) into the vein every 8 (eight) hours.  126 each  0  . cephALEXin (KEFLEX) 500 MG capsule       . Diclofenac-Misoprostol 75-0.2 MG TBEC Take 75 mg by mouth Twice daily as needed (for inflammation). Take 1 by mouth twice daily as needed      . DULoxetine (CYMBALTA) 30 MG capsule Take 3 capsules (90 mg total) by mouth daily.  30 capsule  0  . esomeprazole (NEXIUM) 40 MG capsule Take 40 mg by mouth daily before breakfast.      . heparin lock flush 100 UNIT/ML SOLN injection Inject 2.5 ml (250 units) total into P.I.C.C. Lumen daily or as directed.  30 Syringe  1  . letrozole (FEMARA) 2.5 MG tablet Take 1 tablet (2.5 mg total) by mouth daily.  30 tablet  3  . losartan-hydrochlorothiazide (HYZAAR) 100-25 MG per tablet Take 1 tablet by mouth daily.       . Multiple Vitamins-Minerals (MULTIVITAMINS THER. W/MINERALS) TABS Take 1 tablet by mouth daily.      . OxyCODONE (OXYCONTIN) 20 mg T12A Take 1 tablet (20 mg total) by mouth every 12 (twelve) hours.  30 tablet  0  . oxyCODONE-acetaminophen (PERCOCET/ROXICET) 5-325 MG per tablet Take 1-2 tablets by mouth every 6 (six) hours as needed for pain.  60 tablet  0  . Probiotic Product (PROBIOTIC DAILY PO) Take 1 capsule by mouth daily.       . prochlorperazine (COMPAZINE) 10 MG tablet Take 1 tablet (10 mg total) by mouth every 6 (six) hours as needed.  30 tablet  0   No current facility-administered medications for this visit.   Facility-Administered Medications Ordered in Other Visits  Medication Dose Route Frequency Provider Last Rate Last Dose  . ceFAZolin (ANCEF) IVPB 2 g/50 mL premix    PRN Lance Coon, CRNA   2 g at 08/18/12 1553  . dexamethasone (DECADRON) injection    PRN Lance Coon, CRNA   8 mg at 05/01/12 0908  . fentaNYL (SUBLIMAZE) injection     PRN Lance Coon, CRNA   100 mcg at 05/01/12 0856  . lidocaine (cardiac) 100 mg/6ml (XYLOCAINE) 20 MG/ML injection 2%    PRN Lance Coon, CRNA   60 mg at 05/01/12 0856  . midazolam (VERSED) 5 MG/5ML injection    PRN Lance Coon, CRNA   2 mg at 05/01/12 0856  . propofol (DIPRIVAN) 10 mg/mL bolus/IV push    PRN Lance Coon, CRNA   160 mg at 05/01/12 0900    SURGICAL HISTORY:  Past Surgical History  Procedure Laterality Date  . Tonsillectomy    . Tumor removal  FROM PELVIS AGE 54  . Cesarean section      X2   . No past surgeries      BLADDER SLING  4-5 YRS AGO   . Knee arthroscopy      LT KNEE 04/2011  . Total knee arthroplasty  08/15/2011    Procedure: TOTAL KNEE ARTHROPLASTY; lft Surgeon: Harvie Junior, MD;  Location: [facility] OR;  Service: Orthopedics;  Laterality: Left;  RIGHT KNEE CORTIZONE INJECTION  . Mohs surgery      right face  . Knee arthroscopy  82    rt  . Joint replacement  Feb 2013  . Mastectomy w/ sentinel node biopsy  12/19/2011    Procedure: MASTECTOMY WITH SENTINEL LYMPH NODE BIOPSY;  Surgeon: Currie Paris, MD;  Location: [facility] OR;  Service: General;  Laterality: Left;  left breast and left axilla   . Portacath placement  01/16/2012    Procedure: INSERTION PORT-A-CATH;  Surgeon: Currie Paris, MD;  Location: [facility];  Service: General;  Laterality: Right;  Porta Cath Placement   . I&d knee with poly exchange  03/02/2012    Procedure: IRRIGATION AND DEBRIDEMENT KNEE WITH POLY EXCHANGE;  Surgeon: Harvie Junior, MD;  Location: [facility] OR;  Service: Orthopedics;  Laterality: Left;  I&D of total knee with Possible Poly Exchange   . Tee without cardioversion  03/06/2012    Procedure: TRANSESOPHAGEAL ECHOCARDIOGRAM (TEE);  Surgeon: Wendall Stade, MD;  Location: [facility] ENDOSCOPY;  Service: Cardiovascular;  Laterality: N/A;  Rm. [id number]  . Port-a-cath removal  03/06/2012    Procedure: REMOVAL PORT-A-CATH;  Surgeon: Adolph Pollack, MD;  Location: [facility] OR;   Service: General;  Laterality: N/A;  . Portacath placement  05/01/2012    Procedure: INSERTION PORT-A-CATH;  Surgeon: Currie Paris, MD;  Location: [facility];  Service: General;  Laterality: Right;  right internal jugular port-a-cath insertion  . Total knee arthroplasty Left 08/18/2012    Procedure:  Irrigation and debridement of LEFT total knee;  Removal of total knee parts; Implant of spacers;  Surgeon: Nestor Lewandowsky, MD;  Location: [facility] OR;  Service: Orthopedics;  Laterality: Left;  . Port-a-cath removal Right 08/22/2012    Procedure: REMOVAL PORT-A-CATH;  Surgeon: Mariella Saa, MD;  Location: [facility] OR;  Service: General;  Laterality: Right;    REVIEW OF SYSTEMS:   General: fatigue (+), night sweats (-), fever (-), pain (+) Lymph: palpable nodes (-) HEENT: vision changes (-), mucositis (-), gum bleeding (-), epistaxis (-) Cardiovascular: chest pain (-), palpitations (-) Pulmonary: shortness of breath (-), dyspnea on exertion (-), cough (-), hemoptysis (-) GI:  Early satiety (-), melena (-), dysphagia (-), nausea/vomiting (-), diarrhea (-) GU: dysuria (-), hematuria (-), incontinence (-) Musculoskeletal: joint swelling (-), joint pain (-), back pain (-) Neuro: weakness (-), numbness (-), headache (-), confusion (-) Skin: Rash (-), lesions (-), dryness (-) Psych: depression (+), suicidal/homicidal ideation (+), feeling of hopelessness (-)   PHYSICAL EXAMINATION:  BP 159/90  Pulse 68  Temp(Src) 97.9 F (36.6 C) (Oral)  Resp 20  Ht 5\' 6"  (1.676 m)  Wt 194 lb 12.8 oz (88.361 kg)  BMI 31.46 kg/m2  LMP 12/05/2011 General: Patient is a well appearing female in no acute distress HEENT: PERRLA, sclerae anicteric no conjunctival pallor, MMM Neck: supple, no palpable adenopathy Lungs: clear to auscultation bilaterally, no wheezes, rhonchi, or rales Cardiovascular: regular rate rhythm, S1, S2, no murmurs, rubs or gallops Abdomen: Soft, non-tender, non-distended,  normoactive  bowel sounds, no HSM Extremities: warm and well perfused, no clubbing, cyanosis, or edema Skin: No rashes or lesions Neuro: Non-focal Breasts: left chest wall mastectomy site no nodularity, right breast no masses nodules.   ECOG PERFORMANCE STATUS: 1 - Symptomatic but completely ambulatory  LABORATORY DATA: Lab Results  Component Value Date   WBC 6.4 02/01/2013   HGB 12.3 02/01/2013   HCT 36.6 02/01/2013   MCV 84.5 02/01/2013   PLT 246 02/01/2013      Chemistry      Component Value Date/Time   NA 137 01/11/2013 1054   NA 138 09/01/2012 1420   K 3.8 01/11/2013 1054   K 3.7 09/01/2012 1420   CL 100 11/30/2012 1029   CL 99 09/01/2012 1420   CO2 26 01/11/2013 1054   CO2 28 09/01/2012 1420   BUN 22.9 01/11/2013 1054   BUN 17 09/01/2012 1420   CREATININE 1.4* 01/11/2013 1054   CREATININE 1.03 09/01/2012 1420      Component Value Date/Time   CALCIUM 9.4 01/11/2013 1054   CALCIUM 9.3 09/01/2012 1420   ALKPHOS 151* 01/11/2013 1054   ALKPHOS 400* 09/01/2012 1420   AST 20 01/11/2013 1054   AST 28 09/01/2012 1420   ALT 18 01/11/2013 1054   ALT <5 09/01/2012 1420   BILITOT 0.35 01/11/2013 1054   BILITOT 0.4 09/01/2012 1420     DOB: 12-15-1960 Age: 34 Gender: F Client Name [facility] Collected Date: 12/19/2011 Received Date: 12/19/2011 Physician: Cyndia Bent Chart #: MRN # : [id number] Physician cc: Richardean Chimera, MD Sherian Rein, MD Race:W Visit #: [id number] Kaylyn Lim, RN REPORT OF SURGICAL PATHOLOGY ADDITIONAL INFORMATION: 5. PROGNOSTIC INDICATORS - ACIS Results IMMUNOHISTOCHEMICAL AND MORPHOMETRIC ANALYSIS BY THE AUTOMATED CELLULAR IMAGING SYSTEM (ACIS) Estrogen Receptor (Negative, <1%): 100%, STRONG STAINING INTENSITY Progesterone Receptor (Negative, <1%): 100%, STRONG STAINING INTENSITY Proliferation Marker Ki67 by M IB-1 (Low<20%): 11% All controls stained appropriately Pecola Leisure MD Pathologist, Electronic Signature ( Signed 12/29/2011) 5.  CHROMOGENIC IN-SITU HYBRIDIZATION Interpretation: HER2/NEU BY CISH - SHOWS AMPLIFICATION BY CISH ANALYSIS. THE RATIO OF HER2: CEP 17 SIGNALS WAS 3.27 Reference range: Ratio: HER2:CEP17 < 1.8 gene amplification not observed Ratio: HER2:CEP 17 1.8-2.2 - equivocal result Ratio: HER2:CEP17 > 2.2 - gene amplification observed Pecola Leisure MD Pathologist, Electronic Signature ( Signed 12/27/2011) 1 of 4 FINAL for Mauro, Indira M ([id number]) FINAL DIAGNOSIS Diagnosis 1. Lymph node, sentinel, biopsy, Left axillary - ONE BENIGN LYMPH NODE WITH NO TUMOR SEEN (0/1). 2. Lymph node, sentinel, biopsy, Left axilla - ONE LYMPH NODE POSITIVE FOR METASTATIC DUCTAL CARCINOMA (1/1). - SEE COMMENT. 3. Lymph node, sentinel, biopsy, Left axilla - ONE BENIGN LYMPH NODE WITH NO TUMOR SEEN (0/1). 4. Lymph node, sentinel, biopsy, Left axilla - ONE BENIGN LYMPH NODE WITH NO TUMOR SEEN (0/1). 5. Breast, simple mastectomy, Left - INVASIVE GRADE I DUCTAL CARCINOMA, TWO FOCI MEASURING 0.8 CM AND 0.6 CM ARISING IN A BACKGROUND OF EXTENSIVE INTERMEDIATE GRADE DUCTAL CARCINOMA IN SITU WITH NECROSIS. - MARGINS ARE NEGATIVE. - SEE ONCOLOGY TEMPLATE. Microscopic Comment 2. On re-review of the frozen section slides, no metastatic carcinoma is identified. The carcinoma present is on permanent sections only. 5. BREAST, INVASIVE TUMOR, WITH LYMPH NODE SAMPLING Specimen, including laterality: Left breast with sentinel lymph node. Procedure: Left simple mastectomy with sentinel lymph node biopsy. Grade: I (Both foci of tumors show similar morphology). Tubule formation: 2. Nuclear pleomorphism: 2. Mitotic: 1. Tumor size (glass slide measurement): Two foci measuring 0.8 cm and 0.6  cm. Margins: Invasive, distance to closest margin: At least 1.5 cm. In-situ, distance to closest margin: At least 1.5 cm. Lymphovascular invasion: No definite lymph/vascular invasion is not identified however a sentinel lymph node is  positive (see below). Ductal carcinoma in situ: Yes. Grade: Intermediate grade. Extensive intraductal component: Yes. Lobular neoplasia: Not identified. Tumor focality: Two foci. Treatment effect: Not applicable. Extent of tumor: Tumor confined to breast parenchyma. Lymph nodes: # examined: 4. Lymph nodes with metastasis: 1. Macrometastasis: (> 2.0 mm): 1. Extracapsular extension: Not identified. Breast prognostic profile: A breast prognostic profile will be performed (see below comment). Additional breast findings: Fibrocystic changes with usual ductal hyperplasia. TNM: mpT1b, pN1a, MX. Comment: The palpable nodular area seen grossly is comprised predominantly of intermediate grade ductal carcinoma in situ with only two foci of invasive ductal carcinoma. Both invasive foci are morphologically similar. As such a breast prognostic profile will only be performed on the larger tumor. A breast prognostic profile can be performed upon the smaller tumor per request. (RH:kh 12-21-11) 2 of 4   ASSESSMENT: 52 year old female with  #1 stage II invasive ductal carcinoma of the left breast with ductal carcinoma in situ. Patient is status post mastectomy that showed 2 foci of invasive cancer measuring 0.8 0.6 cm. The tumor is ER positive PR positive HER-2/neu amplified at 3.27. Patient is now seen in medical oncology for discussion of adjuvant treatment. As mentioned above her case was discussed at the multidisciplinary breast conference. Patient is noted to have one of 4 lymph nodes positive for metastatic disease. This was only axillary sentinel node biopsy. A discussion of whether patient under should undergo a full axillary lymph node dissection did take place but at the end of our extensive discussion it was recommended the patient will proceed with radiation and DOS she would feel the criteria of seed 11. Therefore is excellent lymph node dissection is not being done. Instead the patient will proceed  with adjuvant chemotherapy and Herceptin. Her chemotherapy will consist of Taxotere carboplatinum given every 21 days. For the duration of her chemotherapy patient will get Herceptin on a weekly basis. Once she completes the chemotherapy she then will get Herceptin every 3 weeks. After completion of chemotherapy patient will proceed with radiation therapy with concurrent Herceptin. After completion of radiation she will start Herceptin and antiestrogen therapy consisting of either tamoxifen or an aromatase inhibitor.  #2 patient has now completed radiation therapy and Herceptin combination. Her radiation completed on 06/04/2012.  #3 we will reinitiate combination chemotherapy and Herceptin on 08/03/2012. The chemotherapy will consist of Taxotere or and carboplatinum with Herceptin. The Taxotere carboplatinum was be given every 21 days to complete out 2 more cycles. Herceptin will be given on a weekly basis.  She received her last cycle on 09/21/12, and then resumed every three week Herceptin afterwards which she will continue through August.   #4 Discussed anti-estrogen therapy with Mrs. Lehrke.  She is incredibly sedentary due to her knee, therefore, Tamoxifen would not be a good choice due to the VTE risk.  We also discussed aromatase inhibitors.  Her lmp was over one year ago however she did receive chemotherapy for a few of these months.  Her FSH, LH, and estradiol confirmed she is post-menopausal.    PLAN:  #1 Ms. Krzywicki is doing well.   She will proceed with Herceptin therapy and continue Letrozole daily.    #2 Patient will continue to see her orthopedic surgeon, Dr. Luiz Blare for her right knee.  She  is planning repeat surgery in October.    #3 she continue receiving every 3 week Herceptin until September/October 2014 due to missing a couple of doses due to her knee infection that required hospitalization.  She will return in 3 weeks time.     All questions were answered. The patient knows to call  the clinic with any problems, questions or concerns. We can certainly see the patient much sooner if necessary.  I spent 25 minutes counseling the patient face to face. The total time spent in the appointment was 30 minutes.  Cherie Ouch Lyn Hollingshead, NP Medical Oncology [facility] Phone: [phone]

## 2013-02-01 NOTE — Patient Instructions (Addendum)
Doing well.  Proceed with Herceptin.  Continue Letrozole daily.  Please call us if you have any questions or concerns.

## 2013-02-01 NOTE — Progress Notes (Signed)
Faxed disability form to [facility] @ [phone].

## 2013-02-01 NOTE — Telephone Encounter (Signed)
appts made and printed...td 

## 2013-02-04 ENCOUNTER — Other Ambulatory Visit: Payer: 59

## 2013-02-04 ENCOUNTER — Ambulatory Visit (HOSPITAL_BASED_OUTPATIENT_CLINIC_OR_DEPARTMENT_OTHER): Payer: 59 | Admitting: Genetic Counselor

## 2013-02-04 ENCOUNTER — Encounter: Payer: Self-pay | Admitting: Genetic Counselor

## 2013-02-04 DIAGNOSIS — C50912 Malignant neoplasm of unspecified site of left female breast: Secondary | ICD-10-CM

## 2013-02-04 DIAGNOSIS — Z803 Family history of malignant neoplasm of breast: Secondary | ICD-10-CM

## 2013-02-04 DIAGNOSIS — C50419 Malignant neoplasm of upper-outer quadrant of unspecified female breast: Secondary | ICD-10-CM

## 2013-02-04 DIAGNOSIS — IMO0002 Reserved for concepts with insufficient information to code with codable children: Secondary | ICD-10-CM

## 2013-02-04 DIAGNOSIS — Z8041 Family history of malignant neoplasm of ovary: Secondary | ICD-10-CM

## 2013-02-04 NOTE — Progress Notes (Signed)
Dr.  Drue Second requested a consultation for genetic counseling and risk assessment for Julie Ross, a 52 y.o. female, for discussion of her personal history of breast cancer.  She presents to clinic today to discuss the possibility of a genetic predisposition to cancer, and to further clarify her risks, as well as her family members' risks for cancer.   HISTORY OF PRESENT ILLNESS: In June 2013, at the age of 3, Julie Ross was diagnosed with invasive ductal carcinoma of the left breast. This was treated with Mastectomy, chemotherapy and radiation.  Her tumor was triple positive.  She had a colonoscopy which reportedly did not find polyps.  The patient reports having squamous cell carcinoma seven times, starting at age 42.     Past Medical History  Diagnosis Date  . Hypertension   . Hypothyroidism   . Arthritis   . Neuromuscular disorder     MS TX WITH CYMBALTA   . Multiple sclerosis   . H/O colonoscopy   . H/O bone density study 03/2011  . Wears glasses   . Heart burn   . Depression   . Sleep apnea     MILD SLEEP APNEA, NO MACHINE  6 YRS AGO HPT [facility]   . Breast cancer 11/2011    lul/ER+PR+  . Hx of radiation therapy 04/17/12- 06/04/12    left chest wall, axilla, L supraclavicular fossa 4500 cGy, left mastectomy scar/chest wall 5940 cGy  . Cancer     squamous cell skin cancer x7    Past Surgical History  Procedure Laterality Date  . Tonsillectomy    . Tumor removal      FROM PELVIS AGE 29  . Cesarean section      X2   . No past surgeries      BLADDER SLING  4-5 YRS AGO   . Knee arthroscopy      LT KNEE 04/2011  . Total knee arthroplasty  08/15/2011    Procedure: TOTAL KNEE ARTHROPLASTY; lft Surgeon: Harvie Junior, MD;  Location: [facility] OR;  Service: Orthopedics;  Laterality: Left;  RIGHT KNEE CORTIZONE INJECTION  . Mohs surgery      right face  . Knee arthroscopy  82    rt  . Joint replacement  Feb 2013  . Mastectomy w/ sentinel node biopsy  12/19/2011     Procedure: MASTECTOMY WITH SENTINEL LYMPH NODE BIOPSY;  Surgeon: Currie Paris, MD;  Location: [facility] OR;  Service: General;  Laterality: Left;  left breast and left axilla   . Portacath placement  01/16/2012    Procedure: INSERTION PORT-A-CATH;  Surgeon: Currie Paris, MD;  Location: [facility];  Service: General;  Laterality: Right;  Porta Cath Placement   . I&d knee with poly exchange  03/02/2012    Procedure: IRRIGATION AND DEBRIDEMENT KNEE WITH POLY EXCHANGE;  Surgeon: Harvie Junior, MD;  Location: [facility] OR;  Service: Orthopedics;  Laterality: Left;  I&D of total knee with Possible Poly Exchange   . Tee without cardioversion  03/06/2012    Procedure: TRANSESOPHAGEAL ECHOCARDIOGRAM (TEE);  Surgeon: Wendall Stade, MD;  Location: [facility];  Service: Cardiovascular;  Laterality: N/A;  Rm. [id number]  . Port-a-cath removal  03/06/2012    Procedure: REMOVAL PORT-A-CATH;  Surgeon: Adolph Pollack, MD;  Location: [facility];  Service: General;  Laterality: N/A;  . Portacath placement  05/01/2012    Procedure: INSERTION PORT-A-CATH;  Surgeon: Currie Paris, MD;  Location: [facility];  Service: General;  Laterality: Right;  right internal jugular port-a-cath insertion  . Total knee arthroplasty Left 08/18/2012    Procedure:  Irrigation and debridement of LEFT total knee;  Removal of total knee parts; Implant of spacers;  Surgeon: Nestor Lewandowsky, MD;  Location: [facility];  Service: Orthopedics;  Laterality: Left;  . Port-a-cath removal Right 08/22/2012    Procedure: REMOVAL PORT-A-CATH;  Surgeon: Mariella Saa, MD;  Location: [facility];  Service: General;  Laterality: Right;    History   Social History  . Marital Status: Married    Spouse Name: N/A    Number of Children: 2  . Years of Education: N/A   Occupational History  .     Social History Main Topics  . Smoking status: Never Smoker   . Smokeless tobacco: Never Used  . Alcohol Use: No  . Drug Use: No  . Sexual  Activity: Yes   Other Topics Concern  . None   Social History Narrative  . None    REPRODUCTIVE HISTORY AND PERSONAL RISK ASSESSMENT FACTORS: Menarche was at age 67.   postmenopausal Uterus Intact: yes Ovaries Intact: yes [id number], first live birth at age 36  She has not previously undergone treatment for infertility.   Oral Contraceptive use: 13 years   She has not used HRT in the past.    FAMILY HISTORY:  We obtained a detailed, 4-generation family history.  Significant diagnoses are listed below: Family History  Problem Relation Age of Onset  . Breast cancer Paternal Aunt     dx in her 59s  . Heart disease Maternal Grandfather   . Lung cancer Maternal Uncle     smoker  . Breast cancer Paternal Grandmother     dx in her 31s  . Ovarian cancer Paternal Aunt     dx in her 39s    Patient's maternal ancestors are of [place] and [place] descent, and paternal ancestors are of [place] descent. There is no reported Ashkenazi Jewish ancestry. There is no known consanguinity.  GENETIC COUNSELING ASSESSMENT: Julie Ross is a 52 y.o. female with a personal history of of breast cancer and fmaily history of breast and ovarian cancer which somewhat suggestive of a hereditary breast and ovarian cancer syndrome and predisposition to cancer. We, therefore, discussed and recommended the following at today's visit.   DISCUSSION: We reviewed the characteristics, features and inheritance patterns of hereditary cancer syndromes. We also discussed genetic testing, including the appropriate family members to test, the process of testing, insurance coverage and turn-around-time for results. We recommended Julie Ross pursue genetic testing for breast and ovarian cancer genes at [facility].   PLAN: After considering the risks, benefits, and limitations, Julie Ross provided informed consent to pursue genetic testing and the blood sample will be sent to [facility] for analysis of the  Breast/Ovarian Cancer Panel. We discussed the implications of a positive, negative and/ or variant of uncertain significance genetic test result. Results should be available within approximately 3-4 weeks' time, at which point they will be disclosed by telephone to Julie Ross, as will any additional recommendations warranted by these results. Julie Ross will receive a summary of her genetic counseling visit and a copy of her results once available. This information will also be available in Epic. We encouraged Julie Ross to remain in contact with cancer genetics annually so that we can continuously update the family history and inform her of any changes  in cancer genetics and testing that may be of benefit for her family. Julie Ross's questions were answered to her satisfaction today. Our contact information was provided should additional questions or concerns arise.   Per the patient's request, we will contact her by telephone to discuss these results. A follow up genetic counseling visit will be scheduled if indicated.  The patient was seen for a total of 60 minutes, greater than 50% of which was spent face-to-face counseling.  This plan is being carried out per Dr. Feliz Beam recommendations.  This note will also be sent to the referring provider via the electronic medical record. The patient will be supplied with a summary of this genetic counseling discussion as well as educational information on the discussed hereditary cancer syndromes following the conclusion of their visit.   Patient was discussed with Dr. Drue Second.   _______________________________________________________________________ For Office Staff:  Number of people involved in session: 2 Was an Intern/ student involved with case: yes

## 2013-02-07 ENCOUNTER — Ambulatory Visit: Payer: 59 | Admitting: Internal Medicine

## 2013-02-08 ENCOUNTER — Ambulatory Visit (HOSPITAL_BASED_OUTPATIENT_CLINIC_OR_DEPARTMENT_OTHER): Payer: 59

## 2013-02-08 VITALS — BP 167/99 | HR 55 | Temp 98.0°F | Resp 20

## 2013-02-08 DIAGNOSIS — Z452 Encounter for adjustment and management of vascular access device: Secondary | ICD-10-CM

## 2013-02-08 DIAGNOSIS — C50419 Malignant neoplasm of upper-outer quadrant of unspecified female breast: Secondary | ICD-10-CM

## 2013-02-08 MED ORDER — SODIUM CHLORIDE 0.9 % IJ SOLN
10.0000 mL | INTRAMUSCULAR | Status: DC | PRN
  Administered 2013-02-08: 10 mL via INTRAVENOUS
  Filled 2013-02-08: qty 10

## 2013-02-08 MED ORDER — HEPARIN SOD (PORK) LOCK FLUSH 100 UNIT/ML IV SOLN
500.0000 [IU] | Freq: Once | INTRAVENOUS | Status: AC
  Administered 2013-02-08: 250 [IU] via INTRAVENOUS
  Filled 2013-02-08: qty 5

## 2013-02-15 ENCOUNTER — Ambulatory Visit (HOSPITAL_BASED_OUTPATIENT_CLINIC_OR_DEPARTMENT_OTHER): Payer: 59

## 2013-02-15 VITALS — BP 175/88 | HR 62 | Temp 96.9°F

## 2013-02-15 DIAGNOSIS — C50919 Malignant neoplasm of unspecified site of unspecified female breast: Secondary | ICD-10-CM

## 2013-02-15 DIAGNOSIS — Z452 Encounter for adjustment and management of vascular access device: Secondary | ICD-10-CM

## 2013-02-15 DIAGNOSIS — C50119 Malignant neoplasm of central portion of unspecified female breast: Secondary | ICD-10-CM

## 2013-02-15 MED ORDER — HEPARIN SOD (PORK) LOCK FLUSH 100 UNIT/ML IV SOLN
500.0000 [IU] | Freq: Once | INTRAVENOUS | Status: AC
  Filled 2013-02-15: qty 5

## 2013-02-15 MED ORDER — HEPARIN SOD (PORK) LOCK FLUSH 100 UNIT/ML IV SOLN
250.0000 [IU] | Freq: Once | INTRAVENOUS | Status: AC
  Administered 2013-02-15: 250 [IU] via INTRAVENOUS
  Filled 2013-02-15: qty 5

## 2013-02-15 MED ORDER — SODIUM CHLORIDE 0.9 % IJ SOLN
10.0000 mL | INTRAMUSCULAR | Status: DC | PRN
  Administered 2013-02-15: 10 mL via INTRAVENOUS
  Filled 2013-02-15: qty 10

## 2013-02-15 NOTE — Patient Instructions (Addendum)
Implanted Port Instructions An implanted port is a central line that has a round shape and is placed under the skin. It is used for long-term IV (intravenous) access for:  Medicine.  Fluids.  Liquid nutrition, such as TPN (total parenteral nutrition).  Blood samples. Ports can be placed:  In the chest area just below the collarbone (this is the most common place.)  In the arms.  In the belly (abdomen) area.  In the legs. PARTS OF THE PORT A port has 2 main parts:  The reservoir. The reservoir is round, disc-shaped, and will be a small, raised area under your skin.  The reservoir is the part where a needle is inserted (accessed) to either give medicines or to draw blood.  The catheter. The catheter is a long, slender tube that extends from the reservoir. The catheter is placed into a large vein.  Medicine that is inserted into the reservoir goes into the catheter and then into the vein. INSERTION OF THE PORT  The port is surgically placed in either an operating room or in a procedural area (interventional radiology).  Medicine may be given to help you relax during the procedure.  The skin where the port will be inserted is numbed (local anesthetic).  1 or 2 small cuts (incisions) will be made in the skin to insert the port.  The port can be used after it has been inserted. INCISION SITE CARE  The incision site may have small adhesive strips on it. This helps keep the incision site closed. Sometimes, no adhesive strips are placed. Instead of adhesive strips, a special kind of surgical glue is used to keep the incision closed.  If adhesive strips were placed on the incision sites, do not take them off. They will fall off on their own.  The incision site may be sore for 1 to 2 days. Pain medicine can help.  Do not get the incision site wet. Bathe or shower as directed by your caregiver.  The incision site should heal in 5 to 7 days. A small scar may form after the  incision has healed. ACCESSING THE PORT Special steps must be taken to access the port:  Before the port is accessed, a numbing cream can be placed on the skin. This helps numb the skin over the port site.  A sterile technique is used to access the port.  The port is accessed with a needle. Only "non-coring" port needles should be used to access the port. Once the port is accessed, a blood return should be checked. This helps ensure the port is in the vein and is not clogged (clotted).  If your caregiver believes your port should remain accessed, a clear (transparent) bandage will be placed over the needle site. The bandage and needle will need to be changed every week or as directed by your caregiver.  Keep the bandage covering the needle clean and dry. Do not get it wet. Follow your caregiver's instructions on how to take a shower or bath when the port is accessed.  If your port does not need to stay accessed, no bandage is needed over the port. Peripherally Inserted Central Catheter (PICC) Home Guide A peripherally inserted central catheter (PICC) is a long, thin, flexible tube that is inserted into a vein in the upper arm. It is a form of intravenous (IV) access. It is considered to be a "central" line because the tip of the PICC ends in a large vein in your chest. This large  vein is called the superior vena cava (SVC). The PICC tip ends in the SVC because there is a lot of blood flow in the SVC. This allows medicines and IV fluids to be quickly distributed throughout the body. The PICC is inserted using a sterile technique by a specially trained nurse or physician. After the PICC is inserted, a chest X-ray is done to be sure it is in the correct place.  A PICC may be placed for different reasons, such as:  To give medicines and liquid nutrition that can only be given through a central line. Examples are:  Certain antibiotic treatments.  Chemotherapy.  Total parenteral nutrition  (TPN).  To take frequent blood samples.  To give IV fluids and blood products.  If there is difficulty placing a peripheral intravenous (PIV) catheter. If taken care of properly, a PICC can remain in place for several months. A PICC can also allow patients to go home early. Medicine and PICC care can be managed at home by a family member or home healthcare team. RISKS AND COMPLICATIONS Possible problems with a PICC can occasionally occur. This may include:  A clot (thrombus) forming in or at the tip of the PICC. This can cause the PICC to become clogged. A "clot-busting" medicine called tissue plasminogen activator (tPA) can be inserted into the PICC to help break up the clot.  Inflammation of the vein (phlebitis) in which the PICC is placed. Signs of inflammation may include redness, pain at the insertion site, red streaks, or being able to feel a "cord" in the vein where the PICC is located.  Infection in the PICC or at the insertion site. Signs of infection may include fever, chills, redness, swelling, or pus drainage from the PICC insertion site.  PICC movement (malposition). The PICC tip may migrate from its original position due to excessive physical activity, forceful coughing, sneezing, or vomiting.  A break or cut in the PICC. It is important to not use scissors near the PICC.  Nerve or tendon irritation or injury during PICC insertion. HOME CARE INSTRUCTIONS Activity  You may bend your arm and move it freely. If your PICC is near or at the bend of your elbow, avoid activity with repeated motion at the elbow.  Avoid lifting heavy objects as instructed by your caregiver.  Avoid using a crutch with the arm on the same side as your PICC. You may need to use a walker. PICC Dressing  Keep your PICC bandage (dressing) clean and dry to prevent infection.  Ask your caregiver when you may shower. Ask your caregiver to teach you how to wrap the PICC when you do take a shower.  Do  not bathe, swim, or use hot tubs when you have a PICC.  Change the PICC dressing as instructed by your caregiver.  Change your PICC dressing if it becomes loose or wet. General PICC Care  Check the PICC insertion site daily for leakage, redness, swelling, or pain.  Flush the PICC as directed by your caregiver. Let your caregiver know right away if the PICC is difficult to flush or does not flush. Do not use force to flush the PICC.  Do not use a syringe that is less than 10 mLs to flush the PICC.  Never pull or tug on the PICC.  Avoid blood pressure checks on the arm with the PICC.  Keep your PICC identification card with you at all times.  Do not take the PICC out yourself. Only a  trained clinical professional should remove the PICC. SEEK IMMEDIATE MEDICAL CARE IF:  Your PICC is accidently pulled all the way out. If this happens, cover the insertion site with a bandage or gauze dressing. Do not throw the PICC away. Your caregiver will need to inspect it.  Your PICC was tugged or pulled and has partially come out. Do not  push the PICC back in.  There is any type of drainage, redness, or swelling where the PICC enters the skin.  You cannot flush the PICC, it is difficult to flush, or the PICC leaks around the insertion site when it is flushed.  You hear a "flushing" sound when the PICC is flushed.  You have pain, discomfort, or numbness in your arm, shoulder, or jaw on the same side as the PICC .  You feel your heart "racing" or skipping beats.  You notice a hole or tear in the PICC.  You develop chills or a fever. MAKE SURE YOU:   Understand these instructions.  Will watch your condition.  Will get help right away if you are not doing well or get worse. Document Released: 12/11/2002 Document Revised: 08/29/2011 Document Reviewed: 10/11/2010 [facility] Patient Information 2014 [place], [place].

## 2013-02-19 ENCOUNTER — Telehealth: Payer: Self-pay | Admitting: Genetic Counselor

## 2013-02-19 ENCOUNTER — Encounter: Payer: Self-pay | Admitting: Genetic Counselor

## 2013-02-19 NOTE — Telephone Encounter (Signed)
Left VM message with good news and asked that she call back.

## 2013-02-19 NOTE — Telephone Encounter (Signed)
Patient CB.  Revealed negative genetic test results.  Discussed hereditary cancer syndromes, and the the chance that genetic testing in the future may be more sensitive or other genes may be added, so to keep in contact to ensure that there is not a need for updated testing.

## 2013-02-22 ENCOUNTER — Ambulatory Visit (HOSPITAL_BASED_OUTPATIENT_CLINIC_OR_DEPARTMENT_OTHER): Payer: 59 | Admitting: Adult Health

## 2013-02-22 ENCOUNTER — Ambulatory Visit (HOSPITAL_BASED_OUTPATIENT_CLINIC_OR_DEPARTMENT_OTHER): Payer: 59

## 2013-02-22 ENCOUNTER — Other Ambulatory Visit (HOSPITAL_BASED_OUTPATIENT_CLINIC_OR_DEPARTMENT_OTHER): Payer: 59 | Admitting: Lab

## 2013-02-22 ENCOUNTER — Encounter: Payer: Self-pay | Admitting: Adult Health

## 2013-02-22 ENCOUNTER — Telehealth: Payer: Self-pay | Admitting: Oncology

## 2013-02-22 VITALS — BP 153/75 | HR 60 | Temp 98.3°F | Resp 18 | Ht 66.0 in | Wt 196.9 lb

## 2013-02-22 DIAGNOSIS — C50412 Malignant neoplasm of upper-outer quadrant of left female breast: Secondary | ICD-10-CM

## 2013-02-22 DIAGNOSIS — C773 Secondary and unspecified malignant neoplasm of axilla and upper limb lymph nodes: Secondary | ICD-10-CM

## 2013-02-22 DIAGNOSIS — Z170 Estrogen receptor positive status [ER+]: Secondary | ICD-10-CM

## 2013-02-22 DIAGNOSIS — C50419 Malignant neoplasm of upper-outer quadrant of unspecified female breast: Secondary | ICD-10-CM

## 2013-02-22 DIAGNOSIS — Z5112 Encounter for antineoplastic immunotherapy: Secondary | ICD-10-CM

## 2013-02-22 LAB — CBC WITH DIFFERENTIAL/PLATELET
BASO%: 0.7 % (ref 0.0–2.0)
Basophils Absolute: 0.1 10*3/uL (ref 0.0–0.1)
EOS%: 5.3 % (ref 0.0–7.0)
Eosinophils Absolute: 0.4 10*3/uL (ref 0.0–0.5)
HCT: 34.8 % (ref 34.8–46.6)
HGB: 11.8 g/dL (ref 11.6–15.9)
LYMPH%: 25.1 % (ref 14.0–49.7)
MCH: 28.5 pg (ref 25.1–34.0)
MCHC: 33.9 g/dL (ref 31.5–36.0)
MCV: 84.1 fL (ref 79.5–101.0)
MONO#: 0.4 10*3/uL (ref 0.1–0.9)
MONO%: 5.9 % (ref 0.0–14.0)
NEUT#: 4.5 10*3/uL (ref 1.5–6.5)
NEUT%: 63 % (ref 38.4–76.8)
Platelets: 224 10*3/uL (ref 145–400)
RBC: 4.14 10*6/uL (ref 3.70–5.45)
RDW: 13.5 % (ref 11.2–14.5)
WBC: 7.1 10*3/uL (ref 3.9–10.3)
lymph#: 1.8 10*3/uL (ref 0.9–3.3)
nRBC: 0 % (ref 0–0)

## 2013-02-22 LAB — COMPREHENSIVE METABOLIC PANEL (CC13)
ALT: 21 U/L (ref 0–55)
AST: 18 U/L (ref 5–34)
Albumin: 3.7 g/dL (ref 3.5–5.0)
Alkaline Phosphatase: 160 U/L — ABNORMAL HIGH (ref 40–150)
BUN: 24 mg/dL (ref 7.0–26.0)
CO2: 28 mEq/L (ref 22–29)
Calcium: 9.5 mg/dL (ref 8.4–10.4)
Chloride: 100 mEq/L (ref 98–109)
Creatinine: 1.3 mg/dL — ABNORMAL HIGH (ref 0.6–1.1)
Glucose: 104 mg/dl (ref 70–140)
Potassium: 3.8 mEq/L (ref 3.5–5.1)
Sodium: 138 mEq/L (ref 136–145)
Total Bilirubin: 0.53 mg/dL (ref 0.20–1.20)
Total Protein: 7 g/dL (ref 6.4–8.3)

## 2013-02-22 MED ORDER — DIPHENHYDRAMINE HCL 25 MG PO CAPS
50.0000 mg | ORAL_CAPSULE | Freq: Once | ORAL | Status: AC
  Administered 2013-02-22: 50 mg via ORAL

## 2013-02-22 MED ORDER — SODIUM CHLORIDE 0.9 % IJ SOLN
10.0000 mL | INTRAMUSCULAR | Status: DC | PRN
  Administered 2013-02-22: 10 mL
  Filled 2013-02-22: qty 10

## 2013-02-22 MED ORDER — DIPHENHYDRAMINE HCL 25 MG PO CAPS
ORAL_CAPSULE | ORAL | Status: AC
  Filled 2013-02-22: qty 2

## 2013-02-22 MED ORDER — ACETAMINOPHEN 325 MG PO TABS
ORAL_TABLET | ORAL | Status: AC
  Filled 2013-02-22: qty 2

## 2013-02-22 MED ORDER — SODIUM CHLORIDE 0.9 % IV SOLN
Freq: Once | INTRAVENOUS | Status: AC
  Administered 2013-02-22: 13:00:00 via INTRAVENOUS

## 2013-02-22 MED ORDER — HEPARIN SOD (PORK) LOCK FLUSH 100 UNIT/ML IV SOLN
250.0000 [IU] | Freq: Once | INTRAVENOUS | Status: AC | PRN
  Administered 2013-02-22: 250 [IU]
  Filled 2013-02-22: qty 5

## 2013-02-22 MED ORDER — TRASTUZUMAB CHEMO INJECTION 440 MG
6.0000 mg/kg | Freq: Once | INTRAVENOUS | Status: AC
  Administered 2013-02-22: 525 mg via INTRAVENOUS
  Filled 2013-02-22: qty 25

## 2013-02-22 MED ORDER — ACETAMINOPHEN 325 MG PO TABS
650.0000 mg | ORAL_TABLET | Freq: Once | ORAL | Status: AC
  Administered 2013-02-22: 650 mg via ORAL

## 2013-02-22 NOTE — Progress Notes (Signed)
OFFICE PROGRESS NOTE  CC  Julie Ross [address] [place] [address] Dr. Cyndia Bent Dr. Richardean Chimera Dr. Mardella Layman Dr. Gustavus Messing ([facility])  DIAGNOSIS: 52 year old female who originally was seen in the multidisciplinary breast clinic for discussion of new diagnosis of left breast cancer. She is now status post left mastectomy with the final pathology revealing an invasive ductal carcinoma that is ER positive PR positive HER-2/neu positive with Ki-67 11%.  PRIOR THERAPY:  #1 patient was originally seen in the multidisciplinary breast clinic on 11/30/2011 for what at that time looked like a ductal carcinoma in situ of the left breast. Due to the extent of disease she was recommended a mastectomy with axillary lymph node sampling.  #2 12/19/2011 patient had a left mastectomy with sentinel lymph node biopsy. Her final pathology revealed 2 foci of invasive ductal carcinoma measuring 0.8 and 0.6 cm. The tumor was low grade ER positive PR positive. Patient's tumor was found to be HER-2/neu positive with HER-2 ratio of 3.27. Ki-67 was 11%. Patient had sentinel lymph node  Biopsies performed one of 4 lymph nodes were positive for metastatic disease. Patient's final pathologic staging is T1 N1 M0. Stage II  #3 patient has received adjuvant chemotherapy consisting of Taxotere carboplatinum and Herceptin for 2 cycles starting in 01/2012. This was then complicated by development of bacteremia left knee sepsis requiring revision of the knee. Port-A-Cath site infection requiring removal of the Port-A-Cath and placement of a PICC line.  #4 patient then received Herceptin every 3 weeks with radiation therapy  From 10/ 29 through 06/04/12 while she finished up her antibiotics.  #5 patient completed Taxotere carboplatinum and Herceptin combination to finish out her last 2 cycles. We discussed the risks and benefits of this again. This was started on  08/03/12, and she finished two cycles.  The second cycle was delayed due to another incidence of left knee septic arthritis.  Her hardware was removed, she was placed on IV antibiotics, and was cleared by infectious disease to finish her last cycle of [facility] which she did on 09/21/12.    #7 She resumed every three week Herceptin on 10/19/12.   #8Letrozole 2.5 mg daily  CURRENT THERAPY: Every 3 week Herceptin  INTERVAL HISTORY: Julie Ross 52 y.o. female returns for Followup visit.  In regards to the breast cancer she is doing well.  She continues to receive every 3 week herceptin and she is feeling well with this.  Her last echo was on 6/20 and she was cleared to continue Herceptin therapy by Dr. Gala Romney.  She continues to take Letrozole daily and is doing well on this also.  He knees still bother her, but other than that she's doing well.  She denies fevers, chills, night sweats, palpitations, orthopnea, swelling, PND, DOE or any further concerns.    MEDICAL HISTORY: Past Medical History  Diagnosis Date  . Hypertension   . Hypothyroidism   . Arthritis   . Neuromuscular disorder     MS TX WITH CYMBALTA   . Multiple sclerosis   . H/O colonoscopy   . H/O bone density study 03/2011  . Wears glasses   . Heart burn   . Depression   . Sleep apnea     MILD SLEEP APNEA, NO MACHINE  6 YRS AGO HPT REGIONAL   . Breast cancer 11/2011    lul/ER+PR+  . Hx of radiation therapy 04/17/12- 06/04/12    left chest wall,  axilla, L supraclavicular fossa 4500 cGy, left mastectomy scar/chest wall 5940 cGy  . Cancer     squamous cell skin cancer x7    ALLERGIES:  has No Known Allergies.  MEDICATIONS:  Current Outpatient Prescriptions  Medication Sig Dispense Refill  . ALPRAZolam (XANAX) 0.5 MG tablet Take 1 tablet (0.5 mg total) by mouth 3 (three) times daily as needed for anxiety.  15 tablet  0  . amLODipine (NORVASC) 10 MG tablet Take 1 tablet (10 mg total) by mouth daily.  30 tablet  3  . aspirin EC  325 MG EC tablet Take 1 tablet (325 mg total) by mouth 2 (two) times daily.  30 tablet  0  . atenolol (TENORMIN) 25 MG tablet Take 0.5 tablets (12.5 mg total) by mouth daily. For blood pressure  15 tablet  0  . baclofen (LIORESAL) 10 MG tablet 10 mg 3 (three) times daily.       Marland Kitchen CALCIUM-MAGNESIUM PO Take 1 tablet by mouth daily.      Marland Kitchen ceFAZolin (ANCEF) 2-3 GM-% SOLR Inject 50 mLs (2 g total) into the vein every 8 (eight) hours.  126 each  0  . cephALEXin (KEFLEX) 500 MG capsule       . Diclofenac-Misoprostol 75-0.2 MG TBEC Take 75 mg by mouth Twice daily as needed (for inflammation). Take 1 by mouth twice daily as needed      . DULoxetine (CYMBALTA) 30 MG capsule Take 3 capsules (90 mg total) by mouth daily.  30 capsule  0  . esomeprazole (NEXIUM) 40 MG capsule Take 40 mg by mouth daily before breakfast.      . heparin lock flush 100 UNIT/ML SOLN injection Inject 2.5 ml (250 units) total into P.I.C.C. Lumen daily or as directed.  30 Syringe  1  . letrozole (FEMARA) 2.5 MG tablet Take 1 tablet (2.5 mg total) by mouth daily.  30 tablet  3  . losartan-hydrochlorothiazide (HYZAAR) 100-25 MG per tablet Take 1 tablet by mouth daily.       . Multiple Vitamins-Minerals (MULTIVITAMINS THER. W/MINERALS) TABS Take 1 tablet by mouth daily.      . OxyCODONE (OXYCONTIN) 20 mg T12A Take 1 tablet (20 mg total) by mouth every 12 (twelve) hours.  30 tablet  0  . oxyCODONE-acetaminophen (PERCOCET/ROXICET) 5-325 MG per tablet Take 1-2 tablets by mouth every 6 (six) hours as needed for pain.  60 tablet  0  . Probiotic Product (PROBIOTIC DAILY PO) Take 1 capsule by mouth daily.       . prochlorperazine (COMPAZINE) 10 MG tablet Take 1 tablet (10 mg total) by mouth every 6 (six) hours as needed.  30 tablet  0   No current facility-administered medications for this visit.   Facility-Administered Medications Ordered in Other Visits  Medication Dose Route Frequency Provider Last Rate Last Dose  . ceFAZolin (ANCEF) IVPB  2 g/50 mL premix    PRN Lance Coon, CRNA   2 g at 08/18/12 1553  . dexamethasone (DECADRON) injection    PRN Lance Coon, CRNA   8 mg at 05/01/12 0908  . fentaNYL (SUBLIMAZE) injection    PRN Lance Coon, CRNA   100 mcg at 05/01/12 0856  . lidocaine (cardiac) 100 mg/71ml (XYLOCAINE) 20 MG/ML injection 2%    PRN Lance Coon, CRNA   60 mg at 05/01/12 0856  . midazolam (VERSED) 5 MG/5ML injection    PRN Lance Coon, CRNA   2 mg at 05/01/12 0856  . propofol (DIPRIVAN) 10  mg/mL bolus/IV push    PRN Lance Coon, CRNA   160 mg at 05/01/12 0900    SURGICAL HISTORY:  Past Surgical History  Procedure Laterality Date  . Tonsillectomy    . Tumor removal      FROM PELVIS AGE 76  . Cesarean section      X2   . No past surgeries      BLADDER SLING  4-5 YRS AGO   . Knee arthroscopy      LT KNEE 04/2011  . Total knee arthroplasty  08/15/2011    Procedure: TOTAL KNEE ARTHROPLASTY; lft Surgeon: Harvie Junior, MD;  Location: [facility] OR;  Service: Orthopedics;  Laterality: Left;  RIGHT KNEE CORTIZONE INJECTION  . Mohs surgery      right face  . Knee arthroscopy  82    rt  . Joint replacement  Feb 2013  . Mastectomy w/ sentinel node biopsy  12/19/2011    Procedure: MASTECTOMY WITH SENTINEL LYMPH NODE BIOPSY;  Surgeon: Currie Paris, MD;  Location: [facility] OR;  Service: General;  Laterality: Left;  left breast and left axilla   . Portacath placement  01/16/2012    Procedure: INSERTION PORT-A-CATH;  Surgeon: Currie Paris, MD;  Location: [facility];  Service: General;  Laterality: Right;  Porta Cath Placement   . I&d knee with poly exchange  03/02/2012    Procedure: IRRIGATION AND DEBRIDEMENT KNEE WITH POLY EXCHANGE;  Surgeon: Harvie Junior, MD;  Location: [facility] OR;  Service: Orthopedics;  Laterality: Left;  I&D of total knee with Possible Poly Exchange   . Tee without cardioversion  03/06/2012    Procedure: TRANSESOPHAGEAL ECHOCARDIOGRAM (TEE);  Surgeon: Wendall Stade, MD;   Location: [facility];  Service: Cardiovascular;  Laterality: N/A;  Rm. [id number]  . Port-a-cath removal  03/06/2012    Procedure: REMOVAL PORT-A-CATH;  Surgeon: Adolph Pollack, MD;  Location: [facility] OR;  Service: General;  Laterality: N/A;  . Portacath placement  05/01/2012    Procedure: INSERTION PORT-A-CATH;  Surgeon: Currie Paris, MD;  Location: [facility];  Service: General;  Laterality: Right;  right internal jugular port-a-cath insertion  . Total knee arthroplasty Left 08/18/2012    Procedure:  Irrigation and debridement of LEFT total knee;  Removal of total knee parts; Implant of spacers;  Surgeon: Nestor Lewandowsky, MD;  Location: [facility] OR;  Service: Orthopedics;  Laterality: Left;  . Port-a-cath removal Right 08/22/2012    Procedure: REMOVAL PORT-A-CATH;  Surgeon: Mariella Saa, MD;  Location: [facility] OR;  Service: General;  Laterality: Right;    REVIEW OF SYSTEMS:   General: fatigue (+), night sweats (-), fever (-), pain (+) Lymph: palpable nodes (-) HEENT: vision changes (-), mucositis (-), gum bleeding (-), epistaxis (-) Cardiovascular: chest pain (-), palpitations (-) Pulmonary: shortness of breath (-), dyspnea on exertion (-), cough (-), hemoptysis (-) GI:  Early satiety (-), melena (-), dysphagia (-), nausea/vomiting (-), diarrhea (-) GU: dysuria (-), hematuria (-), incontinence (-) Musculoskeletal: joint swelling (-), joint pain (-), back pain (-) Neuro: weakness (-), numbness (-), headache (-), confusion (-) Skin: Rash (-), lesions (-), dryness (-) Psych: depression (+), suicidal/homicidal ideation (+), feeling of hopelessness (-)   PHYSICAL EXAMINATION:  BP 153/75  Pulse 60  Temp(Src) 98.3 F (36.8 C) (Oral)  Resp 18  Ht 5\' 6"  (1.676 m)  Wt 196 lb 14.4 oz (89.313 kg)  BMI 31.8 kg/m2  LMP 12/05/2011 General: Patient is a well appearing female  in no acute distress HEENT: PERRLA, sclerae anicteric no conjunctival pallor, MMM Neck: supple, no palpable  adenopathy Lungs: clear to auscultation bilaterally, no wheezes, rhonchi, or rales Cardiovascular: regular rate rhythm, S1, S2, no murmurs, rubs or gallops Abdomen: Soft, non-tender, non-distended, normoactive bowel sounds, no HSM Extremities: warm and well perfused, no clubbing, cyanosis, or edema Skin: No rashes or lesions Neuro: Non-focal Breasts: deferred, exam limited due to inability to get on exam table due to knees.  ECOG PERFORMANCE STATUS: 1 - Symptomatic but completely ambulatory  LABORATORY DATA: Lab Results  Component Value Date   WBC 7.1 02/22/2013   HGB 11.8 02/22/2013   HCT 34.8 02/22/2013   MCV 84.1 02/22/2013   PLT 224 02/22/2013      Chemistry      Component Value Date/Time   NA 141 02/01/2013 1001   NA 138 09/01/2012 1420   K 3.5 02/01/2013 1001   K 3.7 09/01/2012 1420   CL 100 11/30/2012 1029   CL 99 09/01/2012 1420   CO2 26 02/01/2013 1001   CO2 28 09/01/2012 1420   BUN 21.0 02/01/2013 1001   BUN 17 09/01/2012 1420   CREATININE 1.3* 02/01/2013 1001   CREATININE 1.03 09/01/2012 1420      Component Value Date/Time   CALCIUM 9.9 02/01/2013 1001   CALCIUM 9.3 09/01/2012 1420   ALKPHOS 185* 02/01/2013 1001   ALKPHOS 400* 09/01/2012 1420   AST 32 02/01/2013 1001   AST 28 09/01/2012 1420   ALT 39 02/01/2013 1001   ALT <5 09/01/2012 1420   BILITOT 0.63 02/01/2013 1001   BILITOT 0.4 09/01/2012 1420     DOB: 01-21-1961 Age: 74 Gender: F Client Name [place] Collected Date: 12/19/2011 Received Date: 12/19/2011 Physician: Cyndia Bent Chart #: MRN # : [id number] Physician cc: Richardean Chimera, MD Sherian Rein, MD Race:W Visit #: [id number] Kaylyn Lim, RN REPORT OF SURGICAL PATHOLOGY ADDITIONAL INFORMATION: 5. PROGNOSTIC INDICATORS - ACIS Results IMMUNOHISTOCHEMICAL AND MORPHOMETRIC ANALYSIS BY THE AUTOMATED CELLULAR IMAGING SYSTEM (ACIS) Estrogen Receptor (Negative, <1%): 100%, STRONG STAINING INTENSITY Progesterone Receptor (Negative, <1%): 100%, STRONG  STAINING INTENSITY Proliferation Marker Ki67 by M IB-1 (Low<20%): 11% All controls stained appropriately Pecola Leisure MD Pathologist, Electronic Signature ( Signed 12/29/2011) 5. CHROMOGENIC IN-SITU HYBRIDIZATION Interpretation: HER2/NEU BY CISH - SHOWS AMPLIFICATION BY CISH ANALYSIS. THE RATIO OF HER2: CEP 17 SIGNALS WAS 3.27 Reference range: Ratio: HER2:CEP17 < 1.8 gene amplification not observed Ratio: HER2:CEP 17 1.8-2.2 - equivocal result Ratio: HER2:CEP17 > 2.2 - gene amplification observed Pecola Leisure MD Pathologist, Electronic Signature ( Signed 12/27/2011) 1 of 4 FINAL for Lemelin, Okie M ([id number]) FINAL DIAGNOSIS Diagnosis 1. Lymph node, sentinel, biopsy, Left axillary - ONE BENIGN LYMPH NODE WITH NO TUMOR SEEN (0/1). 2. Lymph node, sentinel, biopsy, Left axilla - ONE LYMPH NODE POSITIVE FOR METASTATIC DUCTAL CARCINOMA (1/1). - SEE COMMENT. 3. Lymph node, sentinel, biopsy, Left axilla - ONE BENIGN LYMPH NODE WITH NO TUMOR SEEN (0/1). 4. Lymph node, sentinel, biopsy, Left axilla - ONE BENIGN LYMPH NODE WITH NO TUMOR SEEN (0/1). 5. Breast, simple mastectomy, Left - INVASIVE GRADE I DUCTAL CARCINOMA, TWO FOCI MEASURING 0.8 CM AND 0.6 CM ARISING IN A BACKGROUND OF EXTENSIVE INTERMEDIATE GRADE DUCTAL CARCINOMA IN SITU WITH NECROSIS. - MARGINS ARE NEGATIVE. - SEE ONCOLOGY TEMPLATE. Microscopic Comment 2. On re-review of the frozen section slides, no metastatic carcinoma is identified. The carcinoma present is on permanent sections only. 5. BREAST, INVASIVE TUMOR, WITH LYMPH NODE SAMPLING Specimen, including  laterality: Left breast with sentinel lymph node. Procedure: Left simple mastectomy with sentinel lymph node biopsy. Grade: I (Both foci of tumors show similar morphology). Tubule formation: 2. Nuclear pleomorphism: 2. Mitotic: 1. Tumor size (glass slide measurement): Two foci measuring 0.8 cm and 0.6 cm. Margins: Invasive, distance to closest margin: At least  1.5 cm. In-situ, distance to closest margin: At least 1.5 cm. Lymphovascular invasion: No definite lymph/vascular invasion is not identified however a sentinel lymph node is positive (see below). Ductal carcinoma in situ: Yes. Grade: Intermediate grade. Extensive intraductal component: Yes. Lobular neoplasia: Not identified. Tumor focality: Two foci. Treatment effect: Not applicable. Extent of tumor: Tumor confined to breast parenchyma. Lymph nodes: # examined: 4. Lymph nodes with metastasis: 1. Macrometastasis: (> 2.0 mm): 1. Extracapsular extension: Not identified. Breast prognostic profile: A breast prognostic profile will be performed (see below comment). Additional breast findings: Fibrocystic changes with usual ductal hyperplasia. TNM: mpT1b, pN1a, MX. Comment: The palpable nodular area seen grossly is comprised predominantly of intermediate grade ductal carcinoma in situ with only two foci of invasive ductal carcinoma. Both invasive foci are morphologically similar. As such a breast prognostic profile will only be performed on the larger tumor. A breast prognostic profile can be performed upon the smaller tumor per request. (RH:kh 12-21-11) 2 of 4   ASSESSMENT: 52 year old female with  #1 stage II invasive ductal carcinoma of the left breast with ductal carcinoma in situ. Patient is status post mastectomy that showed 2 foci of invasive cancer measuring 0.8 0.6 cm. The tumor is ER positive PR positive HER-2/neu amplified at 3.27. Patient is now seen in medical oncology for discussion of adjuvant treatment. As mentioned above her case was discussed at the multidisciplinary breast conference. Patient is noted to have one of 4 lymph nodes positive for metastatic disease. This was only axillary sentinel node biopsy. A discussion of whether patient under should undergo a full axillary lymph node dissection did take place but at the end of our extensive discussion it was recommended the  patient will proceed with radiation and DOS she would feel the criteria of seed 11. Therefore is excellent lymph node dissection is not being done. Instead the patient will proceed with adjuvant chemotherapy and Herceptin. Her chemotherapy will consist of Taxotere carboplatinum given every 21 days. For the duration of her chemotherapy patient will get Herceptin on a weekly basis. Once she completes the chemotherapy she then will get Herceptin every 3 weeks. After completion of chemotherapy patient will proceed with radiation therapy with concurrent Herceptin. After completion of radiation she will start Herceptin and antiestrogen therapy consisting of either tamoxifen or an aromatase inhibitor.  #2 patient has now completed radiation therapy and Herceptin combination. Her radiation completed on 06/04/2012.  #3 we will reinitiate combination chemotherapy and Herceptin on 08/03/2012. The chemotherapy will consist of Taxotere or and carboplatinum with Herceptin. The Taxotere carboplatinum was be given every 21 days to complete out 2 more cycles. Herceptin will be given on a weekly basis.  She received her last cycle on 09/21/12, and then resumed every three week Herceptin afterwards which she will continue through August.   #4 Discussed anti-estrogen therapy with Mrs. Luzader.  She is incredibly sedentary due to her knee, therefore, Tamoxifen would not be a good choice due to the VTE risk.  We also discussed aromatase inhibitors.  Her lmp was over one year ago however she did receive chemotherapy for a few of these months.  Her FSH, LH, and estradiol confirmed she  is post-menopausal.    PLAN:  #1 Ms. Hamblen is doing well.   She will proceed with Herceptin therapy and continue Letrozole daily.  Her last herceptin dose is on 03/15/13.  #2 Patient will continue to see her orthopedic surgeon, Dr. Luiz Blare for her right knee.  She is planning repeat surgery in October.    #3 she continue receiving every 3 week  Herceptin until September/October 2014 due to missing a couple of doses due to her knee infection that required hospitalization.  She will return in 3 weeks time.     All questions were answered. The patient knows to call the clinic with any problems, questions or concerns. We can certainly see the patient much sooner if necessary.  I spent 25 minutes counseling the patient face to face. The total time spent in the appointment was 30 minutes.  Cherie Ouch Lyn Hollingshead, NP Medical Oncology [facility] Phone: [phone]

## 2013-02-22 NOTE — Patient Instructions (Addendum)
Cancer Center Discharge Instructions for Patients Receiving Chemotherapy  Today you received the following chemotherapy agents: Herceptin   To help prevent nausea and vomiting after your treatment, we encourage you to take your nausea medication as directed by your physician.   If you develop nausea and vomiting that is not controlled by your nausea medication, call the clinic.   BELOW ARE SYMPTOMS THAT SHOULD BE REPORTED IMMEDIATELY:  *FEVER GREATER THAN 100.5 F  *CHILLS WITH OR WITHOUT FEVER  NAUSEA AND VOMITING THAT IS NOT CONTROLLED WITH YOUR NAUSEA MEDICATION  *UNUSUAL SHORTNESS OF BREATH  *UNUSUAL BRUISING OR BLEEDING  TENDERNESS IN MOUTH AND THROAT WITH OR WITHOUT PRESENCE OF ULCERS  *URINARY PROBLEMS  *BOWEL PROBLEMS  UNUSUAL RASH Items with * indicate a potential emergency and should be followed up as soon as possible.  Feel free to call the clinic you have any questions or concerns. The clinic phone number is [phone].    

## 2013-02-22 NOTE — Telephone Encounter (Signed)
, °

## 2013-02-22 NOTE — Patient Instructions (Signed)
Doing well.  Proceed with Herceptin.  Please call us if you have any questions or concerns.    We will see you back in 3 weeks.

## 2013-02-27 ENCOUNTER — Encounter: Payer: Self-pay | Admitting: Infectious Disease

## 2013-02-27 ENCOUNTER — Ambulatory Visit (INDEPENDENT_AMBULATORY_CARE_PROVIDER_SITE_OTHER): Payer: 59 | Admitting: Infectious Disease

## 2013-02-27 VITALS — BP 182/118 | HR 65 | Temp 98.1°F | Wt 199.0 lb

## 2013-02-27 DIAGNOSIS — Z96659 Presence of unspecified artificial knee joint: Secondary | ICD-10-CM

## 2013-02-27 DIAGNOSIS — Z23 Encounter for immunization: Secondary | ICD-10-CM

## 2013-02-27 DIAGNOSIS — T889XXS Complication of surgical and medical care, unspecified, sequela: Secondary | ICD-10-CM

## 2013-02-27 DIAGNOSIS — A4901 Methicillin susceptible Staphylococcus aureus infection, unspecified site: Secondary | ICD-10-CM

## 2013-02-27 DIAGNOSIS — T8459XS Infection and inflammatory reaction due to other internal joint prosthesis, sequela: Secondary | ICD-10-CM

## 2013-02-27 DIAGNOSIS — F329 Major depressive disorder, single episode, unspecified: Secondary | ICD-10-CM

## 2013-02-27 DIAGNOSIS — R7881 Bacteremia: Secondary | ICD-10-CM

## 2013-02-27 DIAGNOSIS — F32A Depression, unspecified: Secondary | ICD-10-CM

## 2013-02-27 LAB — C-REACTIVE PROTEIN: CRP: <0.5 mg/dL (ref <0.60)

## 2013-02-27 MED ORDER — MUPIROCIN 2 % EX OINT: TOPICAL_OINTMENT | Freq: Two times a day (BID) | CUTANEOUS | Status: DC

## 2013-02-27 MED ORDER — CHLORHEXIDINE GLUCONATE 4 % EX LIQD: 60.0000 mL | Freq: Every day | CUTANEOUS | Status: DC | PRN

## 2013-02-27 NOTE — Progress Notes (Signed)
Subjective:    Patient ID: Julie Ross, female    DOB: 1960/11/10, 52 y.o.   MRN: [id number]  HPI   52 year old lady with breast cancer with left prosthetic knee with bacteremia in September 2013 and at the time had initial PJI and underwent polyexchange and port-a-cath removal. She completed 6 weeks of antibiotics IV then had been on Keflex. After resumption of chemotherapy she had recurrence of knee swelling and found once again to have PJI with MSSA, now s/p 2 stage revision on March 1st, 2014 . She is  Sp  6 week course of high dose IV ancef then oral keflex  But has been off since late June. I reinforced to the patient that her inflammatory markers and looked encouraging before and that her overall picture was encouraging to me. She seems to be doing relatively well off antibiotics time hopeful that when Dr. Luiz Blare aspirated her knee there is no white blood cells and no evidence of infection.  She has also been suffering from great deal of depression and has had passive suicidal ideation she is being seen by a counselor in the cancer Center. She denied active SI today.    Review of Systems  Constitutional: Negative for fever, chills, diaphoresis, activity change, appetite change, fatigue and unexpected weight change.  HENT: Negative for congestion, sore throat, rhinorrhea, sneezing, trouble swallowing and sinus pressure.   Eyes: Negative for photophobia and visual disturbance.  Respiratory: Negative for cough, chest tightness, shortness of breath, wheezing and stridor.   Cardiovascular: Negative for chest pain, palpitations and leg swelling.  Gastrointestinal: Negative for nausea, vomiting, abdominal pain, diarrhea, constipation, blood in stool, abdominal distention and anal bleeding.  Genitourinary: Negative for dysuria, hematuria, flank pain and difficulty urinating.  Musculoskeletal: Positive for joint swelling and arthralgias. Negative for myalgias, back pain and gait problem.  Skin:  Negative for color change, pallor, rash and wound.  Neurological: Negative for dizziness, tremors, weakness and light-headedness.  Hematological: Negative for adenopathy. Does not bruise/bleed easily.  Psychiatric/Behavioral: Positive for sleep disturbance, dysphoric mood and decreased concentration. Negative for behavioral problems, confusion, self-injury and agitation. The patient is nervous/anxious.        Objective:   Physical Exam  Constitutional: She is oriented to person, place, and time. She appears well-developed and well-nourished. No distress.  HENT:  Head: Normocephalic and atraumatic.  Eyes: Conjunctivae and EOM are normal. Pupils are equal, round, and reactive to light. No scleral icterus.  Neck: Normal range of motion. Neck supple. No JVD present.  Cardiovascular: Normal rate and regular rhythm.   Pulmonary/Chest: Effort normal. No respiratory distress.  Abdominal: Soft. She exhibits no distension.  Musculoskeletal: She exhibits no edema and no tenderness.       Legs: Neurological: She is alert and oriented to person, place, and time. She has normal reflexes. She exhibits normal muscle tone. Coordination normal.  Skin: Skin is warm and dry. She is not diaphoretic. No erythema. No pallor.     Psychiatric: Her behavior is normal. Judgment and thought content normal. Her speech is delayed. She exhibits a depressed mood. She expresses no suicidal ideation.          Assessment & Plan:  Prosthetic jont infection with MSSA:  --Observe off antibiotics recheck esr, crp today, I will see her back as needed. Certainly should her knee have evidence of infection an aspiration by Dr. Luiz Blare then will need to have her have repeat surgery and repeat course of IV antibiotics.  If  she is to have reimplantation of a new prosthetic knee I recommend 10 days of prior twice daily mupirocin intranasally along with once nightly Hibiclens baths--hybrid prescriptions for both of these for the  patient.  MSSA bacteremia: In September and associated with Porta cath --TEE was negative and pt received 6 weeks of abx   Breast Cancer: followed closely by Dr Welton Flakes on herceptin  Depression: Being seen in the cancer Center by a counselor.

## 2013-02-28 LAB — SEDIMENTATION RATE: Sed Rate: 5 mm/hr (ref 0–22)

## 2013-03-01 ENCOUNTER — Ambulatory Visit (HOSPITAL_BASED_OUTPATIENT_CLINIC_OR_DEPARTMENT_OTHER): Payer: 59

## 2013-03-01 VITALS — BP 155/88 | HR 55 | Temp 98.8°F

## 2013-03-01 DIAGNOSIS — C50919 Malignant neoplasm of unspecified site of unspecified female breast: Secondary | ICD-10-CM

## 2013-03-01 MED ORDER — SODIUM CHLORIDE 0.9 % IJ SOLN
10.0000 mL | INTRAMUSCULAR | Status: DC | PRN
  Administered 2013-03-01: 10 mL via INTRAVENOUS
  Filled 2013-03-01: qty 10

## 2013-03-01 MED ORDER — HEPARIN SOD (PORK) LOCK FLUSH 100 UNIT/ML IV SOLN
500.0000 [IU] | Freq: Once | INTRAVENOUS | Status: AC
  Administered 2013-03-01: 250 [IU] via INTRAVENOUS
  Filled 2013-03-01: qty 5

## 2013-03-08 ENCOUNTER — Ambulatory Visit (HOSPITAL_BASED_OUTPATIENT_CLINIC_OR_DEPARTMENT_OTHER): Payer: 59

## 2013-03-08 VITALS — BP 180/93 | HR 61 | Temp 98.3°F | Resp 20

## 2013-03-08 DIAGNOSIS — D0590 Unspecified type of carcinoma in situ of unspecified breast: Secondary | ICD-10-CM

## 2013-03-08 DIAGNOSIS — Z452 Encounter for adjustment and management of vascular access device: Secondary | ICD-10-CM

## 2013-03-08 DIAGNOSIS — C50419 Malignant neoplasm of upper-outer quadrant of unspecified female breast: Secondary | ICD-10-CM

## 2013-03-08 MED ORDER — SODIUM CHLORIDE 0.9 % IJ SOLN
10.0000 mL | INTRAMUSCULAR | Status: DC | PRN
  Filled 2013-03-08: qty 10

## 2013-03-08 MED ORDER — HEPARIN SOD (PORK) LOCK FLUSH 100 UNIT/ML IV SOLN
250.0000 [IU] | Freq: Once | INTRAVENOUS | Status: AC
  Administered 2013-03-08: 250 [IU] via INTRAVENOUS
  Filled 2013-03-08: qty 5

## 2013-03-08 MED ORDER — HEPARIN SOD (PORK) LOCK FLUSH 100 UNIT/ML IV SOLN
500.0000 [IU] | Freq: Once | INTRAVENOUS | Status: DC
  Filled 2013-03-08: qty 5

## 2013-03-08 MED ORDER — SODIUM CHLORIDE 0.9 % IJ SOLN
3.0000 mL | Freq: Once | INTRAMUSCULAR | Status: AC
  Administered 2013-03-08: 3 mL via INTRAVENOUS
  Filled 2013-03-08: qty 10

## 2013-03-08 NOTE — Patient Instructions (Addendum)

## 2013-03-15 ENCOUNTER — Ambulatory Visit (HOSPITAL_BASED_OUTPATIENT_CLINIC_OR_DEPARTMENT_OTHER): Payer: 59 | Admitting: Oncology

## 2013-03-15 ENCOUNTER — Other Ambulatory Visit (HOSPITAL_BASED_OUTPATIENT_CLINIC_OR_DEPARTMENT_OTHER): Payer: 59 | Admitting: Lab

## 2013-03-15 ENCOUNTER — Ambulatory Visit (HOSPITAL_BASED_OUTPATIENT_CLINIC_OR_DEPARTMENT_OTHER): Payer: 59

## 2013-03-15 ENCOUNTER — Ambulatory Visit: Payer: 59

## 2013-03-15 VITALS — BP 154/77 | HR 71 | Temp 98.6°F | Resp 20 | Ht 66.0 in | Wt 197.0 lb

## 2013-03-15 DIAGNOSIS — C50419 Malignant neoplasm of upper-outer quadrant of unspecified female breast: Secondary | ICD-10-CM

## 2013-03-15 DIAGNOSIS — Z5112 Encounter for antineoplastic immunotherapy: Secondary | ICD-10-CM

## 2013-03-15 DIAGNOSIS — C50412 Malignant neoplasm of upper-outer quadrant of left female breast: Secondary | ICD-10-CM

## 2013-03-15 DIAGNOSIS — C50912 Malignant neoplasm of unspecified site of left female breast: Secondary | ICD-10-CM

## 2013-03-15 DIAGNOSIS — Z170 Estrogen receptor positive status [ER+]: Secondary | ICD-10-CM

## 2013-03-15 LAB — COMPREHENSIVE METABOLIC PANEL (CC13)
ALT: 18 U/L (ref 0–55)
AST: 19 U/L (ref 5–34)
Albumin: 3.8 g/dL (ref 3.5–5.0)
Alkaline Phosphatase: 140 U/L (ref 40–150)
BUN: 21.4 mg/dL (ref 7.0–26.0)
CO2: 27 mEq/L (ref 22–29)
Calcium: 9.8 mg/dL (ref 8.4–10.4)
Chloride: 99 mEq/L (ref 98–109)
Creatinine: 1.3 mg/dL — ABNORMAL HIGH (ref 0.6–1.1)
Glucose: 116 mg/dl (ref 70–140)
Potassium: 3.8 mEq/L (ref 3.5–5.1)
Sodium: 138 mEq/L (ref 136–145)
Total Bilirubin: 0.5 mg/dL (ref 0.20–1.20)
Total Protein: 7.3 g/dL (ref 6.4–8.3)

## 2013-03-15 LAB — CBC WITH DIFFERENTIAL/PLATELET
BASO%: 0.8 % (ref 0.0–2.0)
Basophils Absolute: 0.1 10*3/uL (ref 0.0–0.1)
EOS%: 5.5 % (ref 0.0–7.0)
Eosinophils Absolute: 0.3 10*3/uL (ref 0.0–0.5)
HCT: 35.7 % (ref 34.8–46.6)
HGB: 12.1 g/dL (ref 11.6–15.9)
LYMPH%: 30.9 % (ref 14.0–49.7)
MCH: 28.3 pg (ref 25.1–34.0)
MCHC: 33.9 g/dL (ref 31.5–36.0)
MCV: 83.6 fL (ref 79.5–101.0)
MONO#: 0.3 10*3/uL (ref 0.1–0.9)
MONO%: 5.3 % (ref 0.0–14.0)
NEUT#: 3.5 10*3/uL (ref 1.5–6.5)
NEUT%: 57.5 % (ref 38.4–76.8)
Platelets: 225 10*3/uL (ref 145–400)
RBC: 4.27 10*6/uL (ref 3.70–5.45)
RDW: 13.4 % (ref 11.2–14.5)
WBC: 6 10*3/uL (ref 3.9–10.3)
lymph#: 1.9 10*3/uL (ref 0.9–3.3)

## 2013-03-15 MED ORDER — SODIUM CHLORIDE 0.9 % IJ SOLN
10.0000 mL | INTRAMUSCULAR | Status: DC | PRN
  Administered 2013-03-15: 10 mL
  Filled 2013-03-15: qty 10

## 2013-03-15 MED ORDER — DIPHENHYDRAMINE HCL 25 MG PO CAPS
ORAL_CAPSULE | ORAL | Status: AC
  Filled 2013-03-15: qty 2

## 2013-03-15 MED ORDER — HEPARIN SOD (PORK) LOCK FLUSH 100 UNIT/ML IV SOLN
250.0000 [IU] | Freq: Once | INTRAVENOUS | Status: AC | PRN
  Administered 2013-03-15: 250 [IU]
  Filled 2013-03-15: qty 5

## 2013-03-15 MED ORDER — DIPHENHYDRAMINE HCL 25 MG PO CAPS
50.0000 mg | ORAL_CAPSULE | Freq: Once | ORAL | Status: AC
  Administered 2013-03-15: 50 mg via ORAL

## 2013-03-15 MED ORDER — SODIUM CHLORIDE 0.9 % IV SOLN
Freq: Once | INTRAVENOUS | Status: AC
  Administered 2013-03-15: 13:00:00 via INTRAVENOUS

## 2013-03-15 MED ORDER — TRASTUZUMAB CHEMO INJECTION 440 MG
6.0000 mg/kg | Freq: Once | INTRAVENOUS | Status: AC
  Administered 2013-03-15: 525 mg via INTRAVENOUS
  Filled 2013-03-15: qty 25

## 2013-03-15 MED ORDER — ACETAMINOPHEN 325 MG PO TABS
650.0000 mg | ORAL_TABLET | Freq: Once | ORAL | Status: AC
  Administered 2013-03-15: 650 mg via ORAL

## 2013-03-15 MED ORDER — ACETAMINOPHEN 325 MG PO TABS
ORAL_TABLET | ORAL | Status: AC
  Filled 2013-03-15: qty 2

## 2013-03-15 NOTE — Patient Instructions (Addendum)

## 2013-03-15 NOTE — Progress Notes (Signed)
Patient getting treatment in infusion room.

## 2013-03-19 ENCOUNTER — Ambulatory Visit (HOSPITAL_BASED_OUTPATIENT_CLINIC_OR_DEPARTMENT_OTHER)
Admission: RE | Admit: 2013-03-19 | Discharge: 2013-03-19 | Disposition: A | Discharge status: Home / Self Care | Payer: 59 | Source: Ambulatory Visit | Attending: Cardiology | Admitting: Cardiology

## 2013-03-19 ENCOUNTER — Ambulatory Visit (HOSPITAL_COMMUNITY)
Admission: RE | Admit: 2013-03-19 | Discharge: 2013-03-19 | Disposition: A | Discharge status: Home / Self Care | Payer: 59 | Source: Ambulatory Visit | Attending: Physician Assistant | Admitting: Physician Assistant

## 2013-03-19 VITALS — BP 154/106 | HR 54

## 2013-03-19 DIAGNOSIS — I10 Essential (primary) hypertension: Secondary | ICD-10-CM

## 2013-03-19 DIAGNOSIS — I059 Rheumatic mitral valve disease, unspecified: Secondary | ICD-10-CM | POA: Insufficient documentation

## 2013-03-19 DIAGNOSIS — C50919 Malignant neoplasm of unspecified site of unspecified female breast: Secondary | ICD-10-CM | POA: Insufficient documentation

## 2013-03-19 DIAGNOSIS — C50412 Malignant neoplasm of upper-outer quadrant of left female breast: Secondary | ICD-10-CM

## 2013-03-19 DIAGNOSIS — M1710 Unilateral primary osteoarthritis, unspecified knee: Secondary | ICD-10-CM | POA: Insufficient documentation

## 2013-03-19 DIAGNOSIS — C50419 Malignant neoplasm of upper-outer quadrant of unspecified female breast: Secondary | ICD-10-CM

## 2013-03-19 DIAGNOSIS — G35 Multiple sclerosis: Secondary | ICD-10-CM | POA: Insufficient documentation

## 2013-03-19 DIAGNOSIS — Z79899 Other long term (current) drug therapy: Secondary | ICD-10-CM | POA: Insufficient documentation

## 2013-03-19 NOTE — Progress Notes (Signed)
  Echocardiogram 2D Echocardiogram has been performed.  Kimberlie Csaszar FRANCES 03/19/2013, 9:42 AM

## 2013-03-19 NOTE — Progress Notes (Signed)
Patient ID: Julie Ross, female   DOB: 13-Jul-1960, 52 y.o.   MRN: [id number]  Orthopedic: Dr Luiz Blare Oncologist: Dr Welton Flakes ID: Dr Ninetta Lights PCP:   HPI: Julie Ross is a 52 year old female with ductal carcinoma in situ of the left breast, HTN, osteoarthritis, and MS (1998)  S/P L mastectomy and porta cath  12/19/11.  HER-2/neu positive. 1 of  4 lymph nodes + for metastatic disease.  Pathology T1 N1 M0. Stage II . 08/15/11 TKR  She completed  Carboplatin/toxotere/Herceptin on 09/21/12 . She resumed Herceptin every 3 weeks 10/19/12. Completed Herceptin 03/15/13   01/11/12 ECHO EF 60% poor window. 03/02/12 EF 60-65% Lateal S' 13.9 06/11/12 EF 50-55% 09/13/12 EF 55% Lateral S' 13.7 12/07/12 EF 60% lateral s' 13.7 03/19/13 EF 60-65%, global longitudinal strain -24.7%, lateral s' 10.7 cm/sec  08/18/12 S/P removal L Total Knee hardware with spacer in place. MSSA bacteremia  She returns for follow up. Complain of L knee pain. Denies SOB/Orthopnea/PND.  Herceptin completed 03/15/13.  BP is high today but she is in significant pain and has not taken any of her medications.   Past Medical History  Diagnosis Date  . Hypertension   . Hypothyroidism   . Arthritis   . Neuromuscular disorder     MS TX WITH CYMBALTA   . Multiple sclerosis   . H/O colonoscopy   . H/O bone density study 03/2011  . Wears glasses   . Heart burn   . Depression   . Sleep apnea     MILD SLEEP APNEA, NO MACHINE  6 YRS AGO HPT [facility]   . Breast cancer 11/2011    lul/ER+PR+  . Hx of radiation therapy 04/17/12- 06/04/12    left chest wall, axilla, L supraclavicular fossa 4500 cGy, left mastectomy scar/chest wall 5940 cGy  . Cancer     squamous cell skin cancer x7   Current Outpatient Prescriptions on File Prior to Encounter  Medication Sig Dispense Refill  . ALPRAZolam (XANAX) 0.5 MG tablet Take 1 tablet (0.5 mg total) by mouth 3 (three) times daily as needed for anxiety.  15 tablet  0  . amLODipine (NORVASC) 10 MG tablet Take 1 tablet (10  mg total) by mouth daily.  30 tablet  3  . aspirin EC 325 MG EC tablet Take 1 tablet (325 mg total) by mouth 2 (two) times daily.  30 tablet  0  . atenolol (TENORMIN) 25 MG tablet Take 0.5 tablets (12.5 mg total) by mouth daily. For blood pressure  15 tablet  0  . baclofen (LIORESAL) 10 MG tablet 10 mg 3 (three) times daily.       Marland Kitchen CALCIUM-MAGNESIUM PO Take 1 tablet by mouth daily.      . chlorhexidine (HIBICLENS) 4 % external liquid Apply 60 mLs (4 application total) topically daily as needed. WASH HEAD TO TOE ONCE NIGHTLY FOR 10 DAYS PRIOR TO SURGERY  120 mL  1  . Diclofenac-Misoprostol 75-0.2 MG TBEC Take 75 mg by mouth Twice daily as needed (for inflammation). Take 1 by mouth twice daily as needed      . DULoxetine (CYMBALTA) 30 MG capsule Take 3 capsules (90 mg total) by mouth daily.  30 capsule  0  . esomeprazole (NEXIUM) 40 MG capsule Take 40 mg by mouth daily before breakfast.      . heparin lock flush 100 UNIT/ML SOLN injection Inject 2.5 ml (250 units) total into P.I.C.C. Lumen daily or as directed.  30 Syringe  1  . letrozole (  FEMARA) 2.5 MG tablet Take 1 tablet (2.5 mg total) by mouth daily.  30 tablet  3  . losartan-hydrochlorothiazide (HYZAAR) 100-25 MG per tablet Take 1 tablet by mouth daily.       . Multiple Vitamins-Minerals (MULTIVITAMINS THER. W/MINERALS) TABS Take 1 tablet by mouth daily.      . mupirocin ointment (BACTROBAN) 2 % Apply topically 2 (two) times daily. TO INSIDE OF NOSTRILS FOR 10 DAYS PRIOR TO SURGERY  30 g  1  . OxyCODONE (OXYCONTIN) 20 mg T12A Take 1 tablet (20 mg total) by mouth every 12 (twelve) hours.  30 tablet  0  . oxyCODONE-acetaminophen (PERCOCET/ROXICET) 5-325 MG per tablet Take 1-2 tablets by mouth every 6 (six) hours as needed for pain.  60 tablet  0  . Probiotic Product (PROBIOTIC DAILY PO) Take 1 capsule by mouth daily.       . prochlorperazine (COMPAZINE) 10 MG tablet Take 1 tablet (10 mg total) by mouth every 6 (six) hours as needed.  30 tablet  0    Current Facility-Administered Medications on File Prior to Encounter  Medication Dose Route Frequency Provider Last Rate Last Dose  . ceFAZolin (ANCEF) IVPB 2 g/50 mL premix    PRN Lance Coon, CRNA   2 g at 08/18/12 1553  . dexamethasone (DECADRON) injection    PRN Lance Coon, CRNA   8 mg at 05/01/12 0908  . fentaNYL (SUBLIMAZE) injection    PRN Lance Coon, CRNA   100 mcg at 05/01/12 0856  . lidocaine (cardiac) 100 mg/65ml (XYLOCAINE) 20 MG/ML injection 2%    PRN Lance Coon, CRNA   60 mg at 05/01/12 0856  . midazolam (VERSED) 5 MG/5ML injection    PRN Lance Coon, CRNA   2 mg at 05/01/12 0856  . propofol (DIPRIVAN) 10 mg/mL bolus/IV push    PRN Lance Coon, CRNA   160 mg at 05/01/12 0900     PHYSICAL EXAM: Filed Vitals:   03/19/13 0955  BP: 154/106  Pulse: 54   General:   No acute distress. No respiratory difficulty (husband present ) Using walker HEENT: normal  Neck: supple. no JVD.  Carotids 2+ bilat; no bruits. No lymphadenopathy or thryomegaly appreciated. Cor: PMI nondisplaced. Regular rate & rhythm. No rubs, gallops. 2/6 SEM at RSB Lungs: clear Abdomen: soft, nontender, nondistended. No hepatosplenomegaly. No bruits or masses. Good bowel sounds. Extremities: no cyanosis, clubbing, rash + LLE immobilizer RUE PICC Neuro: alert & oriented x 3, cranial nerves grossly intact. moves all 4 extremities w/o difficulty. Affect pleasant.  ASSESSMENT & PLAN:  1. Cancer of upper outer quadrant of female breast. Receiving herceptin every 3 weeks.  Completed Herceptin 03/15/13  Dr Shirlee Latch reviewed and discussed ECHO. EF ok however lateral s' down a little but it was a poor window.   Repeat ECHO in 1 month  2. HTN elevated but likely due to pain and the fact she did not take her medications.  Continue amlodipine 10 mg daily Continue hyzaar 100-25 daily Continue atenolol 12.5 mg daily   3. Osteoarthritis S/P L TKR 08/15/11 with infection and removal of knee  replacement. Spacer placed.   Follow up as needed. Will call with ECHO results  CLEGG,AMY  Patient seen with NP, agree with the above note.  Echo reviewed, showed EF 60-65%.  Global longitudinal strain was normal but lateral s' was decreased from prior.  This may have been technique-dependent as the window was not great and the PW doppler position was not ideal.  She has completed Herceptin.  I am not going to make any medication changes but will have her return in 1 month for echo to make sure that LV function does not look worse.   BP is high but she did not take her meds today and is in a lot of pain.  She needs to take all her antihypertensives daily.   Marca Ancona 03/19/2013 10:49 AM

## 2013-03-19 NOTE — Patient Instructions (Addendum)
Your physician has requested that you have an echocardiogram. Echocardiography is a painless test that uses sound waves to create images of your heart. It provides your doctor with information about the size and shape of your heart and how well your heart's chambers and valves are working. This procedure takes approximately one hour. There are no restrictions for this procedure.  IN 1 MONTH  Follow up as needed

## 2013-03-21 ENCOUNTER — Other Ambulatory Visit: Payer: Self-pay | Admitting: Internal Medicine

## 2013-03-22 ENCOUNTER — Ambulatory Visit (HOSPITAL_BASED_OUTPATIENT_CLINIC_OR_DEPARTMENT_OTHER): Payer: 59

## 2013-03-22 VITALS — BP 159/92 | HR 60 | Temp 98.3°F

## 2013-03-22 DIAGNOSIS — Z452 Encounter for adjustment and management of vascular access device: Secondary | ICD-10-CM

## 2013-03-22 DIAGNOSIS — C50912 Malignant neoplasm of unspecified site of left female breast: Secondary | ICD-10-CM

## 2013-03-22 DIAGNOSIS — C50419 Malignant neoplasm of upper-outer quadrant of unspecified female breast: Secondary | ICD-10-CM

## 2013-03-22 MED ORDER — HEPARIN SOD (PORK) LOCK FLUSH 100 UNIT/ML IV SOLN
500.0000 [IU] | Freq: Once | INTRAVENOUS | Status: AC
  Administered 2013-03-22: 250 [IU] via INTRAVENOUS
  Filled 2013-03-22: qty 5

## 2013-03-22 MED ORDER — SODIUM CHLORIDE 0.9 % IJ SOLN
10.0000 mL | INTRAMUSCULAR | Status: DC | PRN
  Administered 2013-03-22: 10 mL via INTRAVENOUS
  Filled 2013-03-22: qty 10

## 2013-03-27 ENCOUNTER — Other Ambulatory Visit: Payer: Self-pay | Admitting: Orthopedic Surgery

## 2013-03-27 NOTE — Progress Notes (Signed)
OFFICE PROGRESS NOTE  CC  [name] [address] [place] [address] Dr. Cyndia Bent Dr. Richardean Chimera Dr. Mardella Layman Dr. Gustavus Messing ([facility])  DIAGNOSIS: 52 year old female who originally was seen in the multidisciplinary breast clinic for discussion of new diagnosis of left breast cancer. She is now status post left mastectomy with the final pathology revealing an invasive ductal carcinoma that is ER positive PR positive HER-2/neu positive with Ki-67 11%.  PRIOR THERAPY:  #1 patient was originally seen in the multidisciplinary breast clinic on 11/30/2011 for what at that time looked like a ductal carcinoma in situ of the left breast. Due to the extent of disease she was recommended a mastectomy with axillary lymph node sampling.  #2 12/19/2011 patient had a left mastectomy with sentinel lymph node biopsy. Her final pathology revealed 2 foci of invasive ductal carcinoma measuring 0.8 and 0.6 cm. The tumor was low grade ER positive PR positive. Patient's tumor was found to be HER-2/neu positive with HER-2 ratio of 3.27. Ki-67 was 11%. Patient had sentinel lymph node  Biopsies performed one of 4 lymph nodes were positive for metastatic disease. Patient's final pathologic staging is T1 N1 M0. Stage II  #3 patient has received adjuvant chemotherapy consisting of Taxotere carboplatinum and Herceptin for 2 cycles starting in 01/2012. This was then complicated by development of bacteremia left knee sepsis requiring revision of the knee. Port-A-Cath site infection requiring removal of the Port-A-Cath and placement of a PICC line.  #4 patient then received Herceptin every 3 weeks with radiation therapy  From 10/ 29 through 06/04/12 while she finished up her antibiotics.  #5 patient completed Taxotere carboplatinum and Herceptin combination to finish out her last 2 cycles. We discussed the risks and benefits of this again. This was started on  08/03/12, and she finished two cycles.  The second cycle was delayed due to another incidence of left knee septic arthritis.  Her hardware was removed, she was placed on IV antibiotics, and was cleared by infectious disease to finish her last cycle of [facility] which she did on 09/21/12.    #7 She resumed every three week Herceptin on 10/19/12 - 04/14/13  #8Letrozole 2.5 mg daily  CURRENT THERAPY: Every 3 week Herceptin today is her final Herceptin dose.  INTERVAL HISTORY: Julie Ross 52 y.o. female returns for Followup visit. Clinically from a cancer perspective patient is doing well she's tolerated Herceptin very nicely without any significant problems. Unfortunately she continues to have significant problems with her knee. She wants to proceed with getting it replaced. She essentially has been waiting to complete her Herceptin to proceed with her definitive surgery. I do think at this time she could proceed with surgery in about 3-6 weeks time. She has my clearance. Remainder of the 10 point review of systems is negative.  MEDICAL HISTORY: Past Medical History  Diagnosis Date  . Hypertension   . Hypothyroidism   . Arthritis   . Neuromuscular disorder     MS TX WITH CYMBALTA   . Multiple sclerosis   . H/O colonoscopy   . H/O bone density study 03/2011  . Wears glasses   . Heart burn   . Depression   . Sleep apnea     MILD SLEEP APNEA, NO MACHINE  6 YRS AGO HPT REGION[facility]   . Breast cancer 11/2011    lul/ER+PR+  . Hx of radiation therapy 04/17/12- 06/04/12    left chest wall, axilla, L  supraclavicular fossa 4500 cGy, left mastectomy scar/chest wall 5940 cGy  . Cancer     squamous cell skin cancer x7    ALLERGIES:  has No Known Allergies.  MEDICATIONS:  Current Outpatient Prescriptions  Medication Sig Dispense Refill  . ALPRAZolam (XANAX) 0.5 MG tablet Take 1 tablet (0.5 mg total) by mouth 3 (three) times daily as needed for anxiety.  15 tablet  0  . amLODipine (NORVASC) 10 MG tablet  TAKE 1 TABLET BY MOUTH EVERY DAY  30 tablet  6  . aspirin EC 325 MG EC tablet Take 1 tablet (325 mg total) by mouth 2 (two) times daily.  30 tablet  0  . atenolol (TENORMIN) 25 MG tablet Take 0.5 tablets (12.5 mg total) by mouth daily. For blood pressure  15 tablet  0  . baclofen (LIORESAL) 10 MG tablet 10 mg 3 (three) times daily.       Marland Kitchen CALCIUM-MAGNESIUM PO Take 1 tablet by mouth daily.      . chlorhexidine (HIBICLENS) 4 % external liquid Apply 60 mLs (4 application total) topically daily as needed. WASH HEAD TO TOE ONCE NIGHTLY FOR 10 DAYS PRIOR TO SURGERY  120 mL  1  . Diclofenac-Misoprostol 75-0.2 MG TBEC Take 75 mg by mouth Twice daily as needed (for inflammation). Take 1 by mouth twice daily as needed      . DULoxetine (CYMBALTA) 30 MG capsule Take 3 capsules (90 mg total) by mouth daily.  30 capsule  0  . esomeprazole (NEXIUM) 40 MG capsule Take 40 mg by mouth daily before breakfast.      . heparin lock flush 100 UNIT/ML SOLN injection Inject 2.5 ml (250 units) total into P.I.C.C. Lumen daily or as directed.  30 Syringe  1  . letrozole (FEMARA) 2.5 MG tablet Take 1 tablet (2.5 mg total) by mouth daily.  30 tablet  3  . losartan-hydrochlorothiazide (HYZAAR) 100-25 MG per tablet Take 1 tablet by mouth daily.       . Multiple Vitamins-Minerals (MULTIVITAMINS THER. W/MINERALS) TABS Take 1 tablet by mouth daily.      . mupirocin ointment (BACTROBAN) 2 % Apply topically 2 (two) times daily. TO INSIDE OF NOSTRILS FOR 10 DAYS PRIOR TO SURGERY  30 g  1  . OxyCODONE (OXYCONTIN) 20 mg T12A Take 1 tablet (20 mg total) by mouth every 12 (twelve) hours.  30 tablet  0  . oxyCODONE-acetaminophen (PERCOCET/ROXICET) 5-325 MG per tablet Take 1-2 tablets by mouth every 6 (six) hours as needed for pain.  60 tablet  0  . Probiotic Product (PROBIOTIC DAILY PO) Take 1 capsule by mouth daily.       . prochlorperazine (COMPAZINE) 10 MG tablet Take 1 tablet (10 mg total) by mouth every 6 (six) hours as needed.  30  tablet  0   No current facility-administered medications for this visit.   Facility-Administered Medications Ordered in Other Visits  Medication Dose Route Frequency Provider Last Rate Last Dose  . ceFAZolin (ANCEF) IVPB 2 g/50 mL premix    PRN Lance Coon, CRNA   2 g at 08/18/12 1553  . dexamethasone (DECADRON) injection    PRN Lance Coon, CRNA   8 mg at 05/01/12 0908  . fentaNYL (SUBLIMAZE) injection    PRN Lance Coon, CRNA   100 mcg at 05/01/12 0856  . lidocaine (cardiac) 100 mg/29ml (XYLOCAINE) 20 MG/ML injection 2%    PRN Lance Coon, CRNA   60 mg at 05/01/12 0856  . midazolam (VERSED)  5 MG/5ML injection    PRN Lance Coon, CRNA   2 mg at 05/01/12 0856  . propofol (DIPRIVAN) 10 mg/mL bolus/IV push    PRN Lance Coon, CRNA   160 mg at 05/01/12 0900    SURGICAL HISTORY:  Past Surgical History  Procedure Laterality Date  . Tonsillectomy    . Tumor removal      FROM PELVIS AGE 89  . Cesarean section      X2   . No past surgeries      BLADDER SLING  4-5 YRS AGO   . Knee arthroscopy      LT KNEE 04/2011  . Total knee arthroplasty  08/15/2011    Procedure: TOTAL KNEE ARTHROPLASTY; lft Surgeon: Harvie Junior, MD;  Location: [facility] OR;  Service: Orthopedics;  Laterality: Left;  RIGHT KNEE CORTIZONE INJECTION  . Mohs surgery      right face  . Knee arthroscopy  82    rt  . Joint replacement  Feb 2013  . Mastectomy w/ sentinel node biopsy  12/19/2011    Procedure: MASTECTOMY WITH SENTINEL LYMPH NODE BIOPSY;  Surgeon: Currie Paris, MD;  Location: [facility] OR;  Service: General;  Laterality: Left;  left breast and left axilla   . Portacath placement  01/16/2012    Procedure: INSERTION PORT-A-CATH;  Surgeon: Currie Paris, MD;  Location: [facility];  Service: General;  Laterality: Right;  Porta Cath Placement   . I&d knee with poly exchange  03/02/2012    Procedure: IRRIGATION AND DEBRIDEMENT KNEE WITH POLY EXCHANGE;  Surgeon: Harvie Junior, MD;  Location:  [facility] OR;  Service: Orthopedics;  Laterality: Left;  I&D of total knee with Possible Poly Exchange   . Tee without cardioversion  03/06/2012    Procedure: TRANSESOPHAGEAL ECHOCARDIOGRAM (TEE);  Surgeon: Wendall Stade, MD;  Location: [facility] ENDOSCOPY;  Service: Cardiovascular;  Laterality: N/A;  Rm. [id number]  . Port-a-cath removal  03/06/2012    Procedure: REMOVAL PORT-A-CATH;  Surgeon: Adolph Pollack, MD;  Location: [facility] OR;  Service: General;  Laterality: N/A;  . Portacath placement  05/01/2012    Procedure: INSERTION PORT-A-CATH;  Surgeon: Currie Paris, MD;  Location: [facility];  Service: General;  Laterality: Right;  right internal jugular port-a-cath insertion  . Total knee arthroplasty Left 08/18/2012    Procedure:  Irrigation and debridement of LEFT total knee;  Removal of total knee parts; Implant of spacers;  Surgeon: Nestor Lewandowsky, MD;  Location: [facility] OR;  Service: Orthopedics;  Laterality: Left;  . Port-a-cath removal Right 08/22/2012    Procedure: REMOVAL PORT-A-CATH;  Surgeon: Mariella Saa, MD;  Location: [facility] OR;  Service: General;  Laterality: Right;    REVIEW OF SYSTEMS:   General: fatigue (+), night sweats (-), fever (-), pain (+) Lymph: palpable nodes (-) HEENT: vision changes (-), mucositis (-), gum bleeding (-), epistaxis (-) Cardiovascular: chest pain (-), palpitations (-) Pulmonary: shortness of breath (-), dyspnea on exertion (-), cough (-), hemoptysis (-) GI:  Early satiety (-), melena (-), dysphagia (-), nausea/vomiting (-), diarrhea (-) GU: dysuria (-), hematuria (-), incontinence (-) Musculoskeletal: joint swelling (-), joint pain (-), back pain (-) Neuro: weakness (-), numbness (-), headache (-), confusion (-) Skin: Rash (-), lesions (-), dryness (-) Psych: depression (+), suicidal/homicidal ideation (+), feeling of hopelessness (-)   PHYSICAL EXAMINATION:  BP 154/77  Pulse 71  Temp(Src) 98.6 F (37 C) (Oral)  Resp 20  Ht 5\' 6"  (1.676 m)  Wt 197  lb (89.359 kg)  BMI 31.81 kg/m2  LMP 12/05/2011 General: Patient is a well appearing female in no acute distress HEENT: PERRLA, sclerae anicteric no conjunctival pallor, MMM Neck: supple, no palpable adenopathy Lungs: clear to auscultation bilaterally, no wheezes, rhonchi, or rales Cardiovascular: regular rate rhythm, S1, S2, no murmurs, rubs or gallops Abdomen: Soft, non-tender, non-distended, normoactive bowel sounds, no HSM Extremities: warm and well perfused, no clubbing, cyanosis, or edema Skin: No rashes or lesions Neuro: Non-focal Breasts: deferred, exam limited due to inability to get on exam table due to knees.  ECOG PERFORMANCE STATUS: 1 - Symptomatic but completely ambulatory  LABORATORY DATA: Lab Results  Component Value Date   WBC 6.0 03/15/2013   HGB 12.1 03/15/2013   HCT 35.7 03/15/2013   MCV 83.6 03/15/2013   PLT 225 03/15/2013      Chemistry      Component Value Date/Time   NA 138 03/15/2013 1035   NA 138 09/01/2012 1420   K 3.8 03/15/2013 1035   K 3.7 09/01/2012 1420   CL 100 11/30/2012 1029   CL 99 09/01/2012 1420   CO2 27 03/15/2013 1035   CO2 28 09/01/2012 1420   BUN 21.4 03/15/2013 1035   BUN 17 09/01/2012 1420   CREATININE 1.3* 03/15/2013 1035   CREATININE 1.03 09/01/2012 1420      Component Value Date/Time   CALCIUM 9.8 03/15/2013 1035   CALCIUM 9.3 09/01/2012 1420   ALKPHOS 140 03/15/2013 1035   ALKPHOS 400* 09/01/2012 1420   AST 19 03/15/2013 1035   AST 28 09/01/2012 1420   ALT 18 03/15/2013 1035   ALT <5 09/01/2012 1420   BILITOT 0.50 03/15/2013 1035   BILITOT 0.4 09/01/2012 1420     DOB: 23-Jan-1961 Age: 24 Gender: F Client Name [facility] Collected Date: 12/19/2011 Received Date: 12/19/2011 Physician: Cyndia Bent Chart #: MRN # : [id number] Physician cc: Richardean Chimera, MD Sherian Rein, MD Race:W Visit #: [id number] Kaylyn Lim, RN REPORT OF SURGICAL PATHOLOGY ADDITIONAL INFORMATION: 5. PROGNOSTIC INDICATORS -  ACIS Results IMMUNOHISTOCHEMICAL AND MORPHOMETRIC ANALYSIS BY THE AUTOMATED CELLULAR IMAGING SYSTEM (ACIS) Estrogen Receptor (Negative, <1%): 100%, STRONG STAINING INTENSITY Progesterone Receptor (Negative, <1%): 100%, STRONG STAINING INTENSITY Proliferation Marker Ki67 by M IB-1 (Low<20%): 11% All controls stained appropriately Pecola Leisure MD Pathologist, Electronic Signature ( Signed 12/29/2011) 5. CHROMOGENIC IN-SITU HYBRIDIZATION Interpretation: HER2/NEU BY CISH - SHOWS AMPLIFICATION BY CISH ANALYSIS. THE RATIO OF HER2: CEP 17 SIGNALS WAS 3.27 Reference range: Ratio: HER2:CEP17 < 1.8 gene amplification not observed Ratio: HER2:CEP 17 1.8-2.2 - equivocal result Ratio: HER2:CEP17 > 2.2 - gene amplification observed Pecola Leisure MD Pathologist, Electronic Signature ( Signed 12/27/2011) 1 of 4 FINAL for Ross, Julie M ([id number]) FINAL DIAGNOSIS Diagnosis 1. Lymph node, sentinel, biopsy, Left axillary - ONE BENIGN LYMPH NODE WITH NO TUMOR SEEN (0/1). 2. Lymph node, sentinel, biopsy, Left axilla - ONE LYMPH NODE POSITIVE FOR METASTATIC DUCTAL CARCINOMA (1/1). - SEE COMMENT. 3. Lymph node, sentinel, biopsy, Left axilla - ONE BENIGN LYMPH NODE WITH NO TUMOR SEEN (0/1). 4. Lymph node, sentinel, biopsy, Left axilla - ONE BENIGN LYMPH NODE WITH NO TUMOR SEEN (0/1). 5. Breast, simple mastectomy, Left - INVASIVE GRADE I DUCTAL CARCINOMA, TWO FOCI MEASURING 0.8 CM AND 0.6 CM ARISING IN A BACKGROUND OF EXTENSIVE INTERMEDIATE GRADE DUCTAL CARCINOMA IN SITU WITH NECROSIS. - MARGINS ARE NEGATIVE. - SEE ONCOLOGY TEMPLATE. Microscopic Comment 2. On re-review of the frozen section slides, no metastatic carcinoma is  identified. The carcinoma present is on permanent sections only. 5. BREAST, INVASIVE TUMOR, WITH LYMPH NODE SAMPLING Specimen, including laterality: Left breast with sentinel lymph node. Procedure: Left simple mastectomy with sentinel lymph node biopsy. Grade: I (Both foci  of tumors show similar morphology). Tubule formation: 2. Nuclear pleomorphism: 2. Mitotic: 1. Tumor size (glass slide measurement): Two foci measuring 0.8 cm and 0.6 cm. Margins: Invasive, distance to closest margin: At least 1.5 cm. In-situ, distance to closest margin: At least 1.5 cm. Lymphovascular invasion: No definite lymph/vascular invasion is not identified however a sentinel lymph node is positive (see below). Ductal carcinoma in situ: Yes. Grade: Intermediate grade. Extensive intraductal component: Yes. Lobular neoplasia: Not identified. Tumor focality: Two foci. Treatment effect: Not applicable. Extent of tumor: Tumor confined to breast parenchyma. Lymph nodes: # examined: 4. Lymph nodes with metastasis: 1. Macrometastasis: (> 2.0 mm): 1. Extracapsular extension: Not identified. Breast prognostic profile: A breast prognostic profile will be performed (see below comment). Additional breast findings: Fibrocystic changes with usual ductal hyperplasia. TNM: mpT1b, pN1a, MX. Comment: The palpable nodular area seen grossly is comprised predominantly of intermediate grade ductal carcinoma in situ with only two foci of invasive ductal carcinoma. Both invasive foci are morphologically similar. As such a breast prognostic profile will only be performed on the larger tumor. A breast prognostic profile can be performed upon the smaller tumor per request. (RH:kh 12-21-11) 2 of 4   ASSESSMENT: 52 year old female with  #1 stage II invasive ductal carcinoma of the left breast with ductal carcinoma in situ. Patient is status post mastectomy that showed 2 foci of invasive cancer measuring 0.8 0.6 cm. The tumor is ER positive PR positive HER-2/neu amplified at 3.27. Patient is now seen in medical oncology for discussion of adjuvant treatment. As mentioned above her case was discussed at the multidisciplinary breast conference. Patient is noted to have one of 4 lymph nodes positive for  metastatic disease. This was only axillary sentinel node biopsy. A discussion of whether patient under should undergo a full axillary lymph node dissection did take place but at the end of our extensive discussion it was recommended the patient will proceed with radiation and DOS she would feel the criteria of seed 11. Therefore is excellent lymph node dissection is not being done. Instead the patient will proceed with adjuvant chemotherapy and Herceptin. Her chemotherapy will consist of Taxotere carboplatinum given every 21 days. For the duration of her chemotherapy patient will get Herceptin on a weekly basis. Once she completes the chemotherapy she then will get Herceptin every 3 weeks. After completion of chemotherapy patient will proceed with radiation therapy with concurrent Herceptin. After completion of radiation she will start Herceptin and antiestrogen therapy consisting of either tamoxifen or an aromatase inhibitor.  #2 patient has now completed radiation therapy and Herceptin combination. Her radiation completed on 06/04/2012.  #3 we will reinitiate combination chemotherapy and Herceptin on 08/03/2012. The chemotherapy will consist of Taxotere or and carboplatinum with Herceptin. The Taxotere carboplatinum was be given every 21 days to complete out 2 more cycles. Herceptin will be given on a weekly basis.  She received her last cycle on 09/21/12, and then resumed every three week Herceptin afterwards which she will continue through August.   #4 Discussed anti-estrogen therapy with Julie Ross.  She is incredibly sedentary due to her knee, therefore, Tamoxifen would not be a good choice due to the VTE risk.  We also discussed aromatase inhibitors.  Her lmp was over one year ago  however she did receive chemotherapy for a few of these months.  Her FSH, LH, and estradiol confirmed she is post-menopausal.    PLAN:  #1 Ms. Carlisi is doing well.   She will proceed with Herceptin therapy and continue  Letrozole daily.    #2 Patient will continue to see her orthopedic surgeon, Dr. Luiz Blare for her right knee.  She is planning repeat surgery in October.    #3 I will see her back in 3 months ' time or sooner if need arises   All questions were answered. The patient knows to call the clinic with any problems, questions or concerns. We can certainly see the patient much sooner if necessary.  I spent 25 minutes counseling the patient face to face. The total time spent in the appointment was 30 minutes.  Drue Second, MD Medical/Oncology [facility] ([place] [phone] (beeper) [phone] (Office)

## 2013-03-29 ENCOUNTER — Ambulatory Visit (HOSPITAL_BASED_OUTPATIENT_CLINIC_OR_DEPARTMENT_OTHER): Payer: 59

## 2013-03-29 VITALS — BP 168/87 | HR 54 | Temp 98.6°F

## 2013-03-29 DIAGNOSIS — C50912 Malignant neoplasm of unspecified site of left female breast: Secondary | ICD-10-CM

## 2013-03-29 DIAGNOSIS — C50419 Malignant neoplasm of upper-outer quadrant of unspecified female breast: Secondary | ICD-10-CM

## 2013-03-29 DIAGNOSIS — Z452 Encounter for adjustment and management of vascular access device: Secondary | ICD-10-CM

## 2013-03-29 MED ORDER — SODIUM CHLORIDE 0.9 % IJ SOLN
10.0000 mL | INTRAMUSCULAR | Status: DC | PRN
  Administered 2013-03-29: 10 mL via INTRAVENOUS
  Filled 2013-03-29: qty 10

## 2013-03-29 MED ORDER — HEPARIN SOD (PORK) LOCK FLUSH 100 UNIT/ML IV SOLN
500.0000 [IU] | Freq: Once | INTRAVENOUS | Status: AC
Start: 2013-03-29 — End: 2013-03-29
  Administered 2013-03-29: 250 [IU] via INTRAVENOUS
  Filled 2013-03-29: qty 5

## 2013-04-01 ENCOUNTER — Other Ambulatory Visit: Payer: Self-pay | Admitting: Adult Health

## 2013-04-05 ENCOUNTER — Ambulatory Visit (HOSPITAL_BASED_OUTPATIENT_CLINIC_OR_DEPARTMENT_OTHER): Payer: 59

## 2013-04-05 VITALS — BP 163/98 | HR 54 | Temp 98.4°F

## 2013-04-05 DIAGNOSIS — C50419 Malignant neoplasm of upper-outer quadrant of unspecified female breast: Secondary | ICD-10-CM

## 2013-04-05 DIAGNOSIS — C50912 Malignant neoplasm of unspecified site of left female breast: Secondary | ICD-10-CM

## 2013-04-05 DIAGNOSIS — Z452 Encounter for adjustment and management of vascular access device: Secondary | ICD-10-CM

## 2013-04-05 MED ORDER — SODIUM CHLORIDE 0.9 % IJ SOLN
10.0000 mL | INTRAMUSCULAR | Status: DC | PRN
  Administered 2013-04-05: 10 mL via INTRAVENOUS
  Filled 2013-04-05: qty 10

## 2013-04-05 MED ORDER — HEPARIN SOD (PORK) LOCK FLUSH 100 UNIT/ML IV SOLN
500.0000 [IU] | Freq: Once | INTRAVENOUS | Status: AC
  Administered 2013-04-05: 250 [IU] via INTRAVENOUS
  Filled 2013-04-05: qty 5

## 2013-04-10 ENCOUNTER — Encounter (HOSPITAL_COMMUNITY): Payer: Self-pay | Admitting: Pharmacy Technician

## 2013-04-12 ENCOUNTER — Encounter (HOSPITAL_COMMUNITY)
Admission: RE | Admit: 2013-04-12 | Discharge: 2013-04-12 | Disposition: A | Discharge status: Home / Self Care | Payer: 59 | Source: Ambulatory Visit | Attending: Orthopedic Surgery | Admitting: Orthopedic Surgery

## 2013-04-12 ENCOUNTER — Ambulatory Visit (HOSPITAL_BASED_OUTPATIENT_CLINIC_OR_DEPARTMENT_OTHER): Payer: 59

## 2013-04-12 ENCOUNTER — Other Ambulatory Visit (HOSPITAL_COMMUNITY): Payer: Self-pay | Admitting: *Deleted

## 2013-04-12 ENCOUNTER — Encounter (HOSPITAL_COMMUNITY): Payer: Self-pay

## 2013-04-12 VITALS — BP 172/90 | HR 86 | Temp 99.0°F

## 2013-04-12 DIAGNOSIS — Z01818 Encounter for other preprocedural examination: Secondary | ICD-10-CM | POA: Insufficient documentation

## 2013-04-12 DIAGNOSIS — Z01812 Encounter for preprocedural laboratory examination: Secondary | ICD-10-CM | POA: Insufficient documentation

## 2013-04-12 DIAGNOSIS — C50912 Malignant neoplasm of unspecified site of left female breast: Secondary | ICD-10-CM

## 2013-04-12 DIAGNOSIS — Z01810 Encounter for preprocedural cardiovascular examination: Secondary | ICD-10-CM | POA: Insufficient documentation

## 2013-04-12 DIAGNOSIS — Z452 Encounter for adjustment and management of vascular access device: Secondary | ICD-10-CM

## 2013-04-12 DIAGNOSIS — C50419 Malignant neoplasm of upper-outer quadrant of unspecified female breast: Secondary | ICD-10-CM

## 2013-04-12 HISTORY — DX: Anxiety disorder, unspecified: F41.9

## 2013-04-12 HISTORY — DX: Gastro-esophageal reflux disease without esophagitis: K21.9

## 2013-04-12 HISTORY — DX: Anemia, unspecified: D64.9

## 2013-04-12 HISTORY — DX: Personal history of other medical treatment: Z92.89

## 2013-04-12 LAB — BASIC METABOLIC PANEL
BUN: 24 mg/dL — ABNORMAL HIGH (ref 6–23)
CO2: 25 mEq/L (ref 19–32)
Calcium: 9.4 mg/dL (ref 8.4–10.5)
Chloride: 97 mEq/L (ref 96–112)
Creatinine, Ser: 1.22 mg/dL — ABNORMAL HIGH (ref 0.50–1.10)
GFR calc Af Amer: 57 mL/min — ABNORMAL LOW (ref >90)
GFR calc non Af Amer: 49 mL/min — ABNORMAL LOW (ref >90)
Glucose, Bld: 109 mg/dL — ABNORMAL HIGH (ref 70–99)
Potassium: 4.2 mEq/L (ref 3.5–5.1)
Sodium: 135 mEq/L (ref 135–145)

## 2013-04-12 LAB — URINALYSIS, ROUTINE W REFLEX MICROSCOPIC
Bilirubin Urine: NEGATIVE
Glucose, UA: NEGATIVE mg/dL
Hgb urine dipstick: NEGATIVE
Ketones, ur: NEGATIVE mg/dL
Nitrite: NEGATIVE
Protein, ur: NEGATIVE mg/dL
Specific Gravity, Urine: 1.01 (ref 1.005–1.030)
Urobilinogen, UA: 0.2 mg/dL (ref 0.0–1.0)
pH: 5.5 (ref 5.0–8.0)

## 2013-04-12 LAB — SURGICAL PCR SCREEN
MRSA, PCR: NEGATIVE
Staphylococcus aureus: NEGATIVE

## 2013-04-12 LAB — CBC WITH DIFFERENTIAL/PLATELET
Basophils Absolute: 0 10*3/uL (ref 0.0–0.1)
Basophils Relative: 0 % (ref 0–1)
Eosinophils Absolute: 0.4 10*3/uL (ref 0.0–0.7)
Eosinophils Relative: 4 % (ref 0–5)
HCT: 35.4 % — ABNORMAL LOW (ref 36.0–46.0)
Hemoglobin: 11.8 g/dL — ABNORMAL LOW (ref 12.0–15.0)
Lymphocytes Relative: 20 % (ref 12–46)
Lymphs Abs: 1.7 10*3/uL (ref 0.7–4.0)
MCH: 28.1 pg (ref 26.0–34.0)
MCHC: 33.3 g/dL (ref 30.0–36.0)
MCV: 84.3 fL (ref 78.0–100.0)
Monocytes Absolute: 0.4 10*3/uL (ref 0.1–1.0)
Monocytes Relative: 5 % (ref 3–12)
Neutro Abs: 5.7 10*3/uL (ref 1.7–7.7)
Neutrophils Relative %: 70 % (ref 43–77)
Platelets: 224 10*3/uL (ref 150–400)
RBC: 4.2 MIL/uL (ref 3.87–5.11)
RDW: 13.3 % (ref 11.5–15.5)
WBC: 8.1 10*3/uL (ref 4.0–10.5)

## 2013-04-12 LAB — APTT: aPTT: 29 seconds (ref 24–37)

## 2013-04-12 LAB — PROTIME-INR
INR: 0.93 (ref 0.00–1.49)
Prothrombin Time: 12.3 seconds (ref 11.6–15.2)

## 2013-04-12 LAB — URINE MICROSCOPIC-ADD ON

## 2013-04-12 MED ORDER — SODIUM CHLORIDE 0.9 % IJ SOLN
10.0000 mL | INTRAMUSCULAR | Status: DC | PRN
  Administered 2013-04-12: 10 mL via INTRAVENOUS
  Filled 2013-04-12: qty 10

## 2013-04-12 MED ORDER — HEPARIN SOD (PORK) LOCK FLUSH 100 UNIT/ML IV SOLN
500.0000 [IU] | Freq: Once | INTRAVENOUS | Status: AC
  Administered 2013-04-12: 250 [IU] via INTRAVENOUS
  Filled 2013-04-12: qty 5

## 2013-04-12 NOTE — Progress Notes (Signed)
04/12/13 1523  OBSTRUCTIVE SLEEP APNEA  Have you ever been diagnosed with sleep apnea through a sleep study? No  Do you snore loudly (loud enough to be heard through closed doors)?  1  Do you often feel tired, fatigued, or sleepy during the daytime? 1  Has anyone observed you stop breathing during your sleep? 1  Do you have, or are you being treated for high blood pressure? 1  BMI more than 35 kg/m2? 0  Age over 54 years old? 1  Neck circumference greater than 40 cm/18 inches? 0  Gender: 0  Obstructive Sleep Apnea Score 5  Score 4 or greater  Results sent to PCP

## 2013-04-12 NOTE — Pre-Procedure Instructions (Signed)
Julie Ross  04/12/2013   Your procedure is scheduled on:  Friday, April 19, 2013 at 9:30 AM.   Report to [facility] Entrance "A" at 7:30 AM.   Call this number if you have problems the morning of surgery: [phone]   Remember:   Do not eat food or drink liquids after midnight Thursday, 04/18/13.   Take these medicines the morning of surgery with A SIP OF WATER: amLODipine (NORVASC), atenolol (TENORMIN), DULoxetine (CYMBALTA), esomeprazole (NEXIUM), gabapentin (NEURONTIN) and pain med if needed.   Stop all Vitamins, Herbal Medications, Aspirin and NSAIDS (Diclofenac-Misoprostol) as of today 04/12/13.                 Do not wear jewelry, make-up or nail polish.  Do not wear lotions, powders, or perfumes. You may wear deodorant.  Do not shave 48 hours prior to surgery.    Do not bring valuables to the hospital.  [facility] is not responsible                  for any belongings or valuables.               Contacts, dentures or bridgework may not be worn into surgery.  Leave suitcase in the car. After surgery it may be brought to your room.  For patients admitted to the hospital, discharge time is determined by your                treatment team.                Special Instructions: Shower using CHG 2 nights before surgery and the night before surgery.  If you shower the day of surgery use CHG.  Use special wash - you have one bottle of CHG for all showers.  You should use approximately 1/3 of the bottle for each shower.   Please read over the following fact sheets that you were given: Pain Booklet, Coughing and Deep Breathing, MRSA Information and Surgical Site Infection Prevention

## 2013-04-12 NOTE — Progress Notes (Signed)
Pt had CT chest done 12/21/12. Spoke with Revonda Standard, [place] and she felt that the CT was sufficient and no need to do a CXR in PAT.

## 2013-04-17 ENCOUNTER — Ambulatory Visit (HOSPITAL_COMMUNITY)
Admission: RE | Admit: 2013-04-17 | Discharge: 2013-04-17 | Disposition: A | Discharge status: Home / Self Care | Payer: 59 | Source: Ambulatory Visit | Attending: Physician Assistant | Admitting: Physician Assistant

## 2013-04-17 ENCOUNTER — Ambulatory Visit (HOSPITAL_BASED_OUTPATIENT_CLINIC_OR_DEPARTMENT_OTHER): Payer: 59

## 2013-04-17 VITALS — BP 142/89 | HR 62 | Temp 98.8°F | Resp 18

## 2013-04-17 DIAGNOSIS — I080 Rheumatic disorders of both mitral and aortic valves: Secondary | ICD-10-CM | POA: Insufficient documentation

## 2013-04-17 DIAGNOSIS — I059 Rheumatic mitral valve disease, unspecified: Secondary | ICD-10-CM

## 2013-04-17 DIAGNOSIS — Z452 Encounter for adjustment and management of vascular access device: Secondary | ICD-10-CM

## 2013-04-17 DIAGNOSIS — I10 Essential (primary) hypertension: Secondary | ICD-10-CM | POA: Insufficient documentation

## 2013-04-17 DIAGNOSIS — C50919 Malignant neoplasm of unspecified site of unspecified female breast: Secondary | ICD-10-CM | POA: Insufficient documentation

## 2013-04-17 DIAGNOSIS — C50412 Malignant neoplasm of upper-outer quadrant of left female breast: Secondary | ICD-10-CM

## 2013-04-17 DIAGNOSIS — C50419 Malignant neoplasm of upper-outer quadrant of unspecified female breast: Secondary | ICD-10-CM

## 2013-04-17 MED ORDER — HEPARIN SOD (PORK) LOCK FLUSH 100 UNIT/ML IV SOLN
500.0000 [IU] | Freq: Once | INTRAVENOUS | Status: AC
  Administered 2013-04-17: 250 [IU] via INTRAVENOUS
  Filled 2013-04-17: qty 5

## 2013-04-17 MED ORDER — SODIUM CHLORIDE 0.9 % IJ SOLN
10.0000 mL | INTRAMUSCULAR | Status: DC | PRN
  Administered 2013-04-17: 10 mL via INTRAVENOUS
  Filled 2013-04-17: qty 10

## 2013-04-17 NOTE — Progress Notes (Signed)
  Echocardiogram 2D Echocardiogram has been performed.  Julie Ross 04/17/2013, 12:01 PM

## 2013-04-17 NOTE — Patient Instructions (Signed)
Peripherally Inserted Central Catheter (PICC) Home Guide A peripherally inserted central catheter (PICC) is a long, thin, flexible tube that is inserted into a vein in the upper arm. It is a form of intravenous (IV) access. It is considered to be a "central" line because the tip of the PICC ends in a large vein in your chest. This large vein is called the superior vena cava (SVC). The PICC tip ends in the SVC because there is a lot of blood flow in the SVC. This allows medicines and IV fluids to be quickly distributed throughout the body. The PICC is inserted using a sterile technique by a specially trained nurse or physician. After the PICC is inserted, a chest X-ray is done to be sure it is in the correct place.  A PICC may be placed for different reasons, such as:  To give medicines and liquid nutrition that can only be given through a central line. Examples are:  Certain antibiotic treatments.  Chemotherapy.  Total parenteral nutrition (TPN).  To take frequent blood samples.  To give IV fluids and blood products.  If there is difficulty placing a peripheral intravenous (PIV) catheter. If taken care of properly, a PICC can remain in place for several months. A PICC can also allow patients to go home early. Medicine and PICC care can be managed at home by a family member or home healthcare team. RISKS AND COMPLICATIONS Possible problems with a PICC can occasionally occur. This may include:  A clot (thrombus) forming in or at the tip of the PICC. This can cause the PICC to become clogged. A "clot-busting" medicine called tissue plasminogen activator (tPA) can be inserted into the PICC to help break up the clot.  Inflammation of the vein (phlebitis) in which the PICC is placed. Signs of inflammation may include redness, pain at the insertion site, red streaks, or being able to feel a "cord" in the vein where the PICC is located.  Infection in the PICC or at the insertion site. Signs of  infection may include fever, chills, redness, swelling, or pus drainage from the PICC insertion site.  PICC movement (malposition). The PICC tip may migrate from its original position due to excessive physical activity, forceful coughing, sneezing, or vomiting.  A break or cut in the PICC. It is important to not use scissors near the PICC.  Nerve or tendon irritation or injury during PICC insertion. HOME CARE INSTRUCTIONS Activity  You may bend your arm and move it freely. If your PICC is near or at the bend of your elbow, avoid activity with repeated motion at the elbow.  Avoid lifting heavy objects as instructed by your caregiver.  Avoid using a crutch with the arm on the same side as your PICC. You may need to use a walker. PICC Dressing  Keep your PICC bandage (dressing) clean and dry to prevent infection.  Ask your caregiver when you may shower. Ask your caregiver to teach you how to wrap the PICC when you do take a shower.  Do not bathe, swim, or use hot tubs when you have a PICC.  Change the PICC dressing as instructed by your caregiver.  Change your PICC dressing if it becomes loose or wet. General PICC Care  Check the PICC insertion site daily for leakage, redness, swelling, or pain.  Flush the PICC as directed by your caregiver. Let your caregiver know right away if the PICC is difficult to flush or does not flush. Do not use force   to flush the PICC.  Do not use a syringe that is less than 10 mLs to flush the PICC.  Never pull or tug on the PICC.  Avoid blood pressure checks on the arm with the PICC.  Keep your PICC identification card with you at all times.  Do not take the PICC out yourself. Only a trained clinical professional should remove the PICC. SEEK IMMEDIATE MEDICAL CARE IF:  Your PICC is accidently pulled all the way out. If this happens, cover the insertion site with a bandage or gauze dressing. Do not throw the PICC away. Your caregiver will need to  inspect it.  Your PICC was tugged or pulled and has partially come out. Do not  push the PICC back in.  There is any type of drainage, redness, or swelling where the PICC enters the skin.  You cannot flush the PICC, it is difficult to flush, or the PICC leaks around the insertion site when it is flushed.  You hear a "flushing" sound when the PICC is flushed.  You have pain, discomfort, or numbness in your arm, shoulder, or jaw on the same side as the PICC .  You feel your heart "racing" or skipping beats.  You notice a hole or tear in the PICC.  You develop chills or a fever. MAKE SURE YOU:   Understand these instructions.  Will watch your condition.  Will get help right away if you are not doing well or get worse. Document Released: 12/11/2002 Document Revised: 08/29/2011 Document Reviewed: 10/11/2010 [employer] Patient Information 2014 [employer].  

## 2013-04-18 MED ORDER — CEFAZOLIN SODIUM-DEXTROSE 2-3 GM-% IV SOLR
2.0000 g | INTRAVENOUS | Status: DC
  Filled 2013-04-18: qty 50

## 2013-04-18 NOTE — H&P (Signed)
TOTAL KNEE REVISION ADMISSION H&P  Patient is being admitted for left revision total knee arthroplasty.  Subjective:  Chief Complaint:left knee pain.  HPI: Julie Ross, 52 y.o. female, has a history of pain and functional disability in the left knee(s) due to failed previous arthroplasty and patient has failed non-surgical conservative treatments for greater than 12 weeks to include NSAID's and/or analgesics and use of assistive devices. The indications for the revision of the total knee arthroplasty are history of total knee infection. Onset of symptoms was gradual starting several years ago with gradually worsening course since that time.  Prior procedures on the left knee(s) include arthroplasty.  Patient currently rates pain in the left knee(s) at 10 out of 10 with activity. There is worsening of pain with activity and weight bearing and pain that interferes with activities of daily living.  X-rays done in August of 2014, showed a spacer in good position with significant bone loss to the end of the femur and proximal tibia as previously dictated.. This condition presents safety issues increasing the risk of falls.  There is no current active infection.  Status post removal of infected total knee prosthesis, for methicillin sensitive staph aureus 08/18/2012.  The patient has now finished her chemotherapy, although her PICC line is still in place.  Most importantly, she is been off of all antibiotics now for a few weeks.  Dr. Luiz Blare and Mr. Huntley Dec aspirated 3 cc of blood from around the spacer.  They came back culture and Gram stain negative.  Her peripheral white count was 5.9, sedimentation rate 12, CRP 0.5 on 03-2013.  The skin around the spacer has a normal appearance it is soft there is no erythema and no palpable effusion.  The risks and benefits of spacer removal and reimplantation were discussed at length.  Patient Active Problem List   Diagnosis Date Noted  . Hx of radiation therapy   .  Infection of prosthetic left knee joint 09/12/2012  . Osteoarthritis of right knee 09/12/2012  . Essential hypertension, benign 06/11/2012  . Septic joint of left knee joint 03/09/2012  . Hypokalemia 03/04/2012  . Hyponatremia 03/04/2012  . Bacteremia 03/04/2012  . Infected prosthetic knee joint 03/02/2012  . Seroma, postoperative 02/06/2012  . Dehydration 02/03/2012  . Multiple sclerosis 01/23/2012  . Cancer of upper-outer quadrant of female breast 11/22/2011  . Osteoarthritis of both knees 08/15/2011  . Osteoarthritis of left knee 08/15/2011   Past Medical History  Diagnosis Date  . Hypertension   . Hypothyroidism   . Neuromuscular disorder     MS TX WITH CYMBALTA   . Multiple sclerosis   . H/O colonoscopy   . H/O bone density study 03/2011  . Wears glasses   . Heart burn   . Depression   . Hx of radiation therapy 04/17/12- 06/04/12    left chest wall, axilla, L supraclavicular fossa 4500 cGy, left mastectomy scar/chest wall 5940 cGy  . H/O echocardiogram     last one 02/2013, due to chemotherapy  . Anxiety     uses xanax mainly for sleep  . Sleep apnea     MILD SLEEP APNEA, NO (Needed) MACHINE  6 YRS AGO HPT [facility]   . GERD (gastroesophageal reflux disease)   . Breast cancer 11/2011    lul/ER+PR+, x1 lymph node   . Cancer     squamous cell skin cancer x7  . Arthritis     knees   . Anemia     h/o - ?  bld. transfusion - more recent- ?2013    Past Surgical History  Procedure Laterality Date  . Tonsillectomy    . Tumor removal      FROM PELVIS AGE 23  . Cesarean section      X2   . No past surgeries      BLADDER SLING  4-5 YRS AGO   . Knee arthroscopy      LT KNEE 04/2011  . Total knee arthroplasty  08/15/2011    Procedure: TOTAL KNEE ARTHROPLASTY; lft Surgeon: Harvie Junior, MD;  Location: [facility] OR;  Service: Orthopedics;  Laterality: Left;  RIGHT KNEE CORTIZONE INJECTION  . Mohs surgery      right face  . Knee arthroscopy  82    rt  . Mastectomy w/ sentinel  node biopsy  12/19/2011    Procedure: MASTECTOMY WITH SENTINEL LYMPH NODE BIOPSY;  Surgeon: Currie Paris, MD;  Location: [facility] OR;  Service: General;  Laterality: Left;  left breast and left axilla   . Portacath placement  01/16/2012    Procedure: INSERTION PORT-A-CATH;  Surgeon: Currie Paris, MD;  Location: [facility];  Service: General;  Laterality: Right;  Porta Cath Placement   . I&d knee with poly exchange  03/02/2012    Procedure: IRRIGATION AND DEBRIDEMENT KNEE WITH POLY EXCHANGE;  Surgeon: Harvie Junior, MD;  Location: [facility] OR;  Service: Orthopedics;  Laterality: Left;  I&D of total knee with Possible Poly Exchange   . Tee without cardioversion  03/06/2012    Procedure: TRANSESOPHAGEAL ECHOCARDIOGRAM (TEE);  Surgeon: Wendall Stade, MD;  Location: [facility] ENDOSCOPY;  Service: Cardiovascular;  Laterality: N/A;  Rm. [id number]  . Port-a-cath removal  03/06/2012    Procedure: REMOVAL PORT-A-CATH;  Surgeon: Adolph Pollack, MD;  Location: [facility] OR;  Service: General;  Laterality: N/A;  . Portacath placement  05/01/2012    Procedure: INSERTION PORT-A-CATH;  Surgeon: Currie Paris, MD;  Location: [facility];  Service: General;  Laterality: Right;  right internal jugular port-a-cath insertion  . Total knee arthroplasty Left 08/18/2012    Procedure:  Irrigation and debridement of LEFT total knee;  Removal of total knee parts; Implant of spacers;  Surgeon: Nestor Lewandowsky, MD;  Location: [facility] OR;  Service: Orthopedics;  Laterality: Left;  . Port-a-cath removal Right 08/22/2012    Procedure: REMOVAL PORT-A-CATH;  Surgeon: Mariella Saa, MD;  Location: [facility] OR;  Service: General;  Laterality: Right;  . Joint replacement  Feb 2013    left knee- multiple surgeries   . Mastectomy      No prescriptions prior to admission   No Known Allergies  History  Substance Use Topics  . Smoking status: Never Smoker   . Smokeless tobacco: Never Used  . Alcohol Use: No    Family History   Problem Relation Age of Onset  . Breast cancer Paternal Aunt     dx in her 9s  . Heart disease Maternal Grandfather   . Lung cancer Maternal Uncle     smoker  . Breast cancer Paternal Grandmother     dx in her 18s  . Ovarian cancer Paternal Aunt     dx in her 30s      Review of Systems  Constitutional: Negative.   HENT: Negative.   Eyes: Negative.   Respiratory: Negative.   Cardiovascular: Negative.   Gastrointestinal: Negative.   Genitourinary: Negative.   Musculoskeletal: Positive for joint pain.  Skin: Negative.   Neurological:  Negative.   Endo/Heme/Allergies: Negative.   Psychiatric/Behavioral: Negative.      Objective:  Physical Exam  Constitutional: She is oriented to person, place, and time. She appears well-developed and well-nourished.  HENT:  Head: Normocephalic and atraumatic.  Eyes: Pupils are equal, round, and reactive to light.  Neck: Normal range of motion. Neck supple.  Cardiovascular: Intact distal pulses.   Respiratory: Effort normal.  Musculoskeletal: She exhibits tenderness.  Neurological: She is alert and oriented to person, place, and time. She has normal reflexes.  Skin: Skin is warm and dry.  Psychiatric: She has a normal mood and affect. Her behavior is normal. Judgment and thought content normal.    Vital signs in last 24 hours: Temp:  [98.8 F (37.1 C)] 98.8 F (37.1 C) (10/29 1442) Pulse Rate:  [62] 62 (10/29 1442) Resp:  [18] 18 (10/29 1442) BP: (142)/(89) 142/89 mmHg (10/29 1442) SpO2:  [95 %] 95 % (10/29 1442)  Labs:  Estimated body mass index is 31.81 kg/(m^2) as calculated from the following:   Height as of 03/15/13: 5\' 6"  (1.676 m).   Weight as of 03/15/13: 89.359 kg (197 lb).  Imaging Review In addition, x-rays done in August of 2014, showed a spacer in good position with significant bone loss to the end of the femur and proximal tibia as previously dictated.  Assessment/Plan:  End stage arthritis, left knee(s) with  failed previous arthroplasty.   The patient history, physical examination, clinical judgment of the provider and imaging studies are consistent with end stage degenerative joint disease of the left knee(s), previous total knee arthroplasty. Revision total knee arthroplasty is deemed medically necessary. The treatment options including medical management, injection therapy, arthroscopy and revision arthroplasty were discussed at length. The risks and benefits of revision total knee arthroplasty were presented and reviewed. The risks due to aseptic loosening, infection, stiffness, patella tracking problems, thromboembolic complications and other imponderables were discussed. The patient acknowledged the explanation, agreed to proceed with the plan and consent was signed. Patient is being admitted for inpatient treatment for surgery, pain control, PT, OT, prophylactic antibiotics, VTE prophylaxis, progressive ambulation and ADL's and discharge planning.The patient is planning to be discharged home with home health services

## 2013-04-19 ENCOUNTER — Inpatient Hospital Stay (HOSPITAL_COMMUNITY): Payer: 59 | Admitting: Anesthesiology

## 2013-04-19 ENCOUNTER — Inpatient Hospital Stay (HOSPITAL_COMMUNITY): Payer: 59

## 2013-04-19 ENCOUNTER — Encounter (HOSPITAL_COMMUNITY): Payer: Self-pay | Admitting: *Deleted

## 2013-04-19 ENCOUNTER — Encounter (HOSPITAL_COMMUNITY): Payer: 59 | Admitting: Anesthesiology

## 2013-04-19 ENCOUNTER — Inpatient Hospital Stay (HOSPITAL_COMMUNITY)
Admission: RE | Admit: 2013-04-19 | Discharge: 2013-04-23 | DRG: 467 | Disposition: A | Discharge status: Skilled Nursing Facility | Payer: 59 | Source: Ambulatory Visit | Attending: Orthopedic Surgery | Admitting: Orthopedic Surgery

## 2013-04-19 ENCOUNTER — Encounter (HOSPITAL_COMMUNITY)
Admission: RE | Disposition: A | Discharge status: Skilled Nursing Facility | Payer: Self-pay | Source: Ambulatory Visit | Attending: Orthopedic Surgery

## 2013-04-19 DIAGNOSIS — Z89529 Acquired absence of unspecified knee: Secondary | ICD-10-CM | POA: Diagnosis present

## 2013-04-19 DIAGNOSIS — G35 Multiple sclerosis: Secondary | ICD-10-CM | POA: Diagnosis present

## 2013-04-19 DIAGNOSIS — Z9221 Personal history of antineoplastic chemotherapy: Secondary | ICD-10-CM

## 2013-04-19 DIAGNOSIS — D62 Acute posthemorrhagic anemia: Secondary | ICD-10-CM | POA: Diagnosis not present

## 2013-04-19 DIAGNOSIS — Z853 Personal history of malignant neoplasm of breast: Secondary | ICD-10-CM

## 2013-04-19 DIAGNOSIS — I10 Essential (primary) hypertension: Secondary | ICD-10-CM | POA: Diagnosis present

## 2013-04-19 DIAGNOSIS — G4730 Sleep apnea, unspecified: Secondary | ICD-10-CM | POA: Diagnosis present

## 2013-04-19 DIAGNOSIS — F3289 Other specified depressive episodes: Secondary | ICD-10-CM | POA: Diagnosis present

## 2013-04-19 DIAGNOSIS — Z923 Personal history of irradiation: Secondary | ICD-10-CM

## 2013-04-19 DIAGNOSIS — E039 Hypothyroidism, unspecified: Secondary | ICD-10-CM | POA: Diagnosis present

## 2013-04-19 DIAGNOSIS — Z9010 Acquired absence of unspecified breast and nipple: Secondary | ICD-10-CM

## 2013-04-19 DIAGNOSIS — F411 Generalized anxiety disorder: Secondary | ICD-10-CM | POA: Diagnosis present

## 2013-04-19 DIAGNOSIS — F329 Major depressive disorder, single episode, unspecified: Secondary | ICD-10-CM | POA: Diagnosis present

## 2013-04-19 DIAGNOSIS — K219 Gastro-esophageal reflux disease without esophagitis: Secondary | ICD-10-CM | POA: Diagnosis present

## 2013-04-19 DIAGNOSIS — Z7982 Long term (current) use of aspirin: Secondary | ICD-10-CM

## 2013-04-19 DIAGNOSIS — Z85828 Personal history of other malignant neoplasm of skin: Secondary | ICD-10-CM

## 2013-04-19 DIAGNOSIS — G709 Myoneural disorder, unspecified: Secondary | ICD-10-CM | POA: Diagnosis present

## 2013-04-19 DIAGNOSIS — M21869 Other specified acquired deformities of unspecified lower leg: Principal | ICD-10-CM | POA: Diagnosis present

## 2013-04-19 DIAGNOSIS — Z79899 Other long term (current) drug therapy: Secondary | ICD-10-CM

## 2013-04-19 HISTORY — PX: TOTAL KNEE REVISION: SHX996

## 2013-04-19 LAB — GRAM STAIN

## 2013-04-19 SURGERY — TOTAL KNEE REVISION
Anesthesia: General | Site: Knee | Laterality: Left | Wound class: Clean

## 2013-04-19 MED ORDER — AMLODIPINE BESYLATE 10 MG PO TABS
10.0000 mg | ORAL_TABLET | Freq: Every day | ORAL | Status: DC
  Administered 2013-04-20 – 2013-04-23 (×4): 10 mg via ORAL
  Filled 2013-04-19 (×5): qty 1

## 2013-04-19 MED ORDER — MENTHOL 3 MG MT LOZG: 1.0000 | LOZENGE | OROMUCOSAL | Status: DC | PRN

## 2013-04-19 MED ORDER — PHENYLEPHRINE HCL 10 MG/ML IJ SOLN
INTRAMUSCULAR | Status: DC | PRN
  Administered 2013-04-19: 80 ug via INTRAVENOUS
  Administered 2013-04-19: 120 ug via INTRAVENOUS
  Administered 2013-04-19: 80 ug via INTRAVENOUS
  Administered 2013-04-19: 40 ug via INTRAVENOUS
  Administered 2013-04-19: 80 ug via INTRAVENOUS

## 2013-04-19 MED ORDER — SODIUM CHLORIDE 0.9 % IJ SOLN
10.0000 mL | INTRAMUSCULAR | Status: DC | PRN
  Administered 2013-04-21 – 2013-04-23 (×6): 10 mL

## 2013-04-19 MED ORDER — HYDROMORPHONE HCL PF 1 MG/ML IJ SOLN
0.2500 mg | INTRAMUSCULAR | Status: DC | PRN
  Administered 2013-04-19 (×2): 0.5 mg via INTRAVENOUS

## 2013-04-19 MED ORDER — SENNOSIDES-DOCUSATE SODIUM 8.6-50 MG PO TABS: 1.0000 | ORAL_TABLET | Freq: Every evening | ORAL | Status: DC | PRN

## 2013-04-19 MED ORDER — ASPIRIN EC 325 MG PO TBEC
325.0000 mg | DELAYED_RELEASE_TABLET | Freq: Every day | ORAL | Status: DC
  Administered 2013-04-20 – 2013-04-23 (×4): 325 mg via ORAL
  Filled 2013-04-19 (×5): qty 1

## 2013-04-19 MED ORDER — DIPHENHYDRAMINE HCL 12.5 MG/5ML PO ELIX
12.5000 mg | ORAL_SOLUTION | ORAL | Status: DC | PRN
  Administered 2013-04-23: 25 mg via ORAL
  Filled 2013-04-19: qty 10

## 2013-04-19 MED ORDER — LACTATED RINGERS IV SOLN
INTRAVENOUS | Status: DC
  Administered 2013-04-19: 08:00:00 via INTRAVENOUS

## 2013-04-19 MED ORDER — ACETAMINOPHEN 325 MG PO TABS
650.0000 mg | ORAL_TABLET | Freq: Four times a day (QID) | ORAL | Status: DC | PRN
  Administered 2013-04-20: 650 mg via ORAL
  Filled 2013-04-19: qty 2

## 2013-04-19 MED ORDER — MEPERIDINE HCL 25 MG/ML IJ SOLN: 6.2500 mg | INTRAMUSCULAR | Status: DC | PRN

## 2013-04-19 MED ORDER — ONDANSETRON HCL 4 MG PO TABS
4.0000 mg | ORAL_TABLET | Freq: Four times a day (QID) | ORAL | Status: DC | PRN
  Administered 2013-04-21: 4 mg via ORAL
  Filled 2013-04-19: qty 1

## 2013-04-19 MED ORDER — BUPIVACAINE-EPINEPHRINE PF 0.5-1:200000 % IJ SOLN
INTRAMUSCULAR | Status: DC | PRN
  Administered 2013-04-19: 30 mL

## 2013-04-19 MED ORDER — PANTOPRAZOLE SODIUM 40 MG PO TBEC
80.0000 mg | DELAYED_RELEASE_TABLET | Freq: Every day | ORAL | Status: DC
  Administered 2013-04-20 – 2013-04-22 (×3): 80 mg via ORAL
  Filled 2013-04-19 (×3): qty 2

## 2013-04-19 MED ORDER — LETROZOLE 2.5 MG PO TABS
2.5000 mg | ORAL_TABLET | Freq: Every day | ORAL | Status: DC
  Administered 2013-04-20 – 2013-04-23 (×4): 2.5 mg via ORAL
  Filled 2013-04-19 (×5): qty 1

## 2013-04-19 MED ORDER — OXYCODONE HCL 5 MG PO TABS: 5.0000 mg | ORAL_TABLET | Freq: Once | ORAL | Status: DC | PRN

## 2013-04-19 MED ORDER — TRANEXAMIC ACID 100 MG/ML IV SOLN
1000.0000 mg | INTRAVENOUS | Status: AC
  Administered 2013-04-19: 1000 mg via INTRAVENOUS
  Filled 2013-04-19: qty 10

## 2013-04-19 MED ORDER — LOSARTAN POTASSIUM 50 MG PO TABS
100.0000 mg | ORAL_TABLET | Freq: Every day | ORAL | Status: DC
  Administered 2013-04-20 – 2013-04-23 (×4): 100 mg via ORAL
  Filled 2013-04-19 (×4): qty 2

## 2013-04-19 MED ORDER — METHOCARBAMOL 500 MG PO TABS
500.0000 mg | ORAL_TABLET | Freq: Four times a day (QID) | ORAL | Status: DC | PRN
  Administered 2013-04-20 – 2013-04-22 (×3): 500 mg via ORAL
  Filled 2013-04-19 (×3): qty 1

## 2013-04-19 MED ORDER — EPHEDRINE SULFATE 50 MG/ML IJ SOLN
INTRAMUSCULAR | Status: DC | PRN
  Administered 2013-04-19: 5 mg via INTRAVENOUS
  Administered 2013-04-19: 10 mg via INTRAVENOUS
  Administered 2013-04-19: 5 mg via INTRAVENOUS
  Administered 2013-04-19 (×3): 10 mg via INTRAVENOUS

## 2013-04-19 MED ORDER — CEFUROXIME SODIUM 1.5 G IJ SOLR
INTRAMUSCULAR | Status: DC | PRN
  Administered 2013-04-19: 1.5 g

## 2013-04-19 MED ORDER — ONDANSETRON HCL 4 MG/2ML IJ SOLN
4.0000 mg | Freq: Four times a day (QID) | INTRAMUSCULAR | Status: DC | PRN
  Administered 2013-04-20: 4 mg via INTRAVENOUS
  Filled 2013-04-19: qty 2

## 2013-04-19 MED ORDER — MIDAZOLAM HCL 2 MG/2ML IJ SOLN
INTRAMUSCULAR | Status: AC
  Administered 2013-04-19: 2 mg via INTRAVENOUS
  Filled 2013-04-19: qty 2

## 2013-04-19 MED ORDER — LOSARTAN POTASSIUM-HCTZ 100-25 MG PO TABS: 1.0000 | ORAL_TABLET | Freq: Every day | ORAL | Status: DC

## 2013-04-19 MED ORDER — METOCLOPRAMIDE HCL 5 MG/ML IJ SOLN: 5.0000 mg | Freq: Three times a day (TID) | INTRAMUSCULAR | Status: DC | PRN

## 2013-04-19 MED ORDER — OXYCODONE HCL 5 MG PO TABS
5.0000 mg | ORAL_TABLET | ORAL | Status: DC | PRN
  Administered 2013-04-19 – 2013-04-22 (×11): 10 mg via ORAL
  Filled 2013-04-19 (×11): qty 2

## 2013-04-19 MED ORDER — METHOCARBAMOL 100 MG/ML IJ SOLN
500.0000 mg | Freq: Four times a day (QID) | INTRAVENOUS | Status: DC | PRN
  Filled 2013-04-19: qty 5

## 2013-04-19 MED ORDER — DOCUSATE SODIUM 100 MG PO CAPS
100.0000 mg | ORAL_CAPSULE | Freq: Two times a day (BID) | ORAL | Status: DC
  Administered 2013-04-19 – 2013-04-23 (×9): 100 mg via ORAL
  Filled 2013-04-19 (×10): qty 1

## 2013-04-19 MED ORDER — GLYCOPYRROLATE 0.2 MG/ML IJ SOLN
INTRAMUSCULAR | Status: DC | PRN
  Administered 2013-04-19: 0.4 mg via INTRAVENOUS

## 2013-04-19 MED ORDER — HYDROMORPHONE HCL PF 1 MG/ML IJ SOLN
INTRAMUSCULAR | Status: AC
  Filled 2013-04-19: qty 1

## 2013-04-19 MED ORDER — ALUM & MAG HYDROXIDE-SIMETH 200-200-20 MG/5ML PO SUSP
30.0000 mL | ORAL | Status: DC | PRN
  Administered 2013-04-22 – 2013-04-23 (×2): 30 mL via ORAL
  Filled 2013-04-19 (×3): qty 30

## 2013-04-19 MED ORDER — HYDROCHLOROTHIAZIDE 25 MG PO TABS
25.0000 mg | ORAL_TABLET | Freq: Every day | ORAL | Status: DC
  Administered 2013-04-20 – 2013-04-23 (×4): 25 mg via ORAL
  Filled 2013-04-19 (×4): qty 1

## 2013-04-19 MED ORDER — MAGNESIUM CITRATE PO SOLN
1.0000 | Freq: Once | ORAL | Status: AC | PRN
  Filled 2013-04-19: qty 296

## 2013-04-19 MED ORDER — BUPIVACAINE LIPOSOME 1.3 % IJ SUSP
20.0000 mL | INTRAMUSCULAR | Status: DC
  Filled 2013-04-19: qty 20

## 2013-04-19 MED ORDER — DULOXETINE HCL 60 MG PO CPEP
60.0000 mg | ORAL_CAPSULE | Freq: Every day | ORAL | Status: DC
  Administered 2013-04-20 – 2013-04-23 (×4): 60 mg via ORAL
  Filled 2013-04-19 (×5): qty 1

## 2013-04-19 MED ORDER — OXYCODONE HCL 5 MG/5ML PO SOLN: 5.0000 mg | Freq: Once | ORAL | Status: DC | PRN

## 2013-04-19 MED ORDER — ONDANSETRON HCL 4 MG/2ML IJ SOLN: 4.0000 mg | Freq: Once | INTRAMUSCULAR | Status: DC | PRN

## 2013-04-19 MED ORDER — FENTANYL CITRATE 0.05 MG/ML IJ SOLN
INTRAMUSCULAR | Status: DC | PRN
  Administered 2013-04-19 (×4): 50 ug via INTRAVENOUS
  Administered 2013-04-19: 100 ug via INTRAVENOUS

## 2013-04-19 MED ORDER — MUPIROCIN 2 % EX OINT
1.0000 "application " | TOPICAL_OINTMENT | Freq: Two times a day (BID) | CUTANEOUS | Status: DC
  Administered 2013-04-20: 1 via TOPICAL
  Filled 2013-04-19 (×2): qty 22

## 2013-04-19 MED ORDER — GABAPENTIN 300 MG PO CAPS
300.0000 mg | ORAL_CAPSULE | Freq: Two times a day (BID) | ORAL | Status: DC
  Administered 2013-04-19 – 2013-04-23 (×8): 300 mg via ORAL
  Filled 2013-04-19 (×11): qty 1

## 2013-04-19 MED ORDER — BISACODYL 5 MG PO TBEC
5.0000 mg | DELAYED_RELEASE_TABLET | Freq: Every day | ORAL | Status: DC | PRN
  Administered 2013-04-21: 5 mg via ORAL
  Filled 2013-04-19: qty 1

## 2013-04-19 MED ORDER — ALBUMIN HUMAN 5 % IV SOLN
INTRAVENOUS | Status: DC | PRN
  Administered 2013-04-19 (×2): via INTRAVENOUS

## 2013-04-19 MED ORDER — SODIUM CHLORIDE 0.9 % IR SOLN
Status: DC | PRN
  Administered 2013-04-19: 3000 mL

## 2013-04-19 MED ORDER — CEFUROXIME SODIUM 1.5 G IJ SOLR
INTRAMUSCULAR | Status: AC
  Filled 2013-04-19: qty 1.5

## 2013-04-19 MED ORDER — VECURONIUM BROMIDE 10 MG IV SOLR
INTRAVENOUS | Status: DC | PRN
  Administered 2013-04-19 (×3): 1 mg via INTRAVENOUS

## 2013-04-19 MED ORDER — KCL IN DEXTROSE-NACL 20-5-0.45 MEQ/L-%-% IV SOLN
INTRAVENOUS | Status: DC
  Administered 2013-04-19 – 2013-04-20 (×2): via INTRAVENOUS
  Filled 2013-04-19 (×16): qty 1000

## 2013-04-19 MED ORDER — LACTATED RINGERS IV SOLN
INTRAVENOUS | Status: DC | PRN
  Administered 2013-04-19 (×2): via INTRAVENOUS

## 2013-04-19 MED ORDER — SODIUM CHLORIDE 0.9 % IJ SOLN
INTRAMUSCULAR | Status: DC | PRN
  Administered 2013-04-19: 11:00:00

## 2013-04-19 MED ORDER — CEFAZOLIN SODIUM-DEXTROSE 2-3 GM-% IV SOLR
2.0000 g | Freq: Three times a day (TID) | INTRAVENOUS | Status: AC
  Administered 2013-04-19 – 2013-04-20 (×2): 2 g via INTRAVENOUS
  Filled 2013-04-19 (×2): qty 50

## 2013-04-19 MED ORDER — ACETAMINOPHEN 650 MG RE SUPP: 650.0000 mg | Freq: Four times a day (QID) | RECTAL | Status: DC | PRN

## 2013-04-19 MED ORDER — ONDANSETRON HCL 4 MG/2ML IJ SOLN
INTRAMUSCULAR | Status: DC | PRN
  Administered 2013-04-19: 4 mg via INTRAVENOUS

## 2013-04-19 MED ORDER — DEXTROSE-NACL 5-0.45 % IV SOLN: INTRAVENOUS | Status: DC

## 2013-04-19 MED ORDER — ATENOLOL 12.5 MG HALF TABLET
12.5000 mg | ORAL_TABLET | Freq: Every day | ORAL | Status: DC
  Administered 2013-04-20 – 2013-04-23 (×4): 12.5 mg via ORAL
  Filled 2013-04-19 (×5): qty 1

## 2013-04-19 MED ORDER — ALPRAZOLAM 0.5 MG PO TABS
0.5000 mg | ORAL_TABLET | Freq: Three times a day (TID) | ORAL | Status: DC | PRN
  Administered 2013-04-19: 0.5 mg via ORAL
  Filled 2013-04-19 (×3): qty 1

## 2013-04-19 MED ORDER — NEOSTIGMINE METHYLSULFATE 1 MG/ML IJ SOLN
INTRAMUSCULAR | Status: DC | PRN
  Administered 2013-04-19: 3 mg via INTRAVENOUS

## 2013-04-19 MED ORDER — PHENYLEPHRINE HCL 10 MG/ML IJ SOLN
10.0000 mg | INTRAVENOUS | Status: DC | PRN
  Administered 2013-04-19: 20 ug/min via INTRAVENOUS

## 2013-04-19 MED ORDER — CHLORHEXIDINE GLUCONATE 4 % EX LIQD: 60.0000 mL | Freq: Once | CUTANEOUS | Status: DC

## 2013-04-19 MED ORDER — METOCLOPRAMIDE HCL 10 MG PO TABS: 5.0000 mg | ORAL_TABLET | Freq: Three times a day (TID) | ORAL | Status: DC | PRN

## 2013-04-19 MED ORDER — FENTANYL CITRATE 0.05 MG/ML IJ SOLN
INTRAMUSCULAR | Status: AC
  Administered 2013-04-19: 100 ug via INTRAVENOUS
  Filled 2013-04-19: qty 2

## 2013-04-19 MED ORDER — HYDROMORPHONE HCL PF 1 MG/ML IJ SOLN
0.5000 mg | INTRAMUSCULAR | Status: DC | PRN
  Administered 2013-04-19 – 2013-04-20 (×4): 0.5 mg via INTRAVENOUS
  Filled 2013-04-19 (×4): qty 1

## 2013-04-19 MED ORDER — PHENOL 1.4 % MT LIQD: 1.0000 | OROMUCOSAL | Status: DC | PRN

## 2013-04-19 MED ORDER — LIDOCAINE HCL (CARDIAC) 20 MG/ML IV SOLN
INTRAVENOUS | Status: DC | PRN
  Administered 2013-04-19: 60 mg via INTRAVENOUS

## 2013-04-19 MED ORDER — ROCURONIUM BROMIDE 100 MG/10ML IV SOLN
INTRAVENOUS | Status: DC | PRN
  Administered 2013-04-19: 50 mg via INTRAVENOUS

## 2013-04-19 MED ORDER — PROPOFOL 10 MG/ML IV BOLUS
INTRAVENOUS | Status: DC | PRN
  Administered 2013-04-19: 170 mg via INTRAVENOUS

## 2013-04-19 MED ORDER — BACLOFEN 10 MG PO TABS
10.0000 mg | ORAL_TABLET | Freq: Three times a day (TID) | ORAL | Status: DC
  Administered 2013-04-19 – 2013-04-23 (×12): 10 mg via ORAL
  Filled 2013-04-19 (×15): qty 1

## 2013-04-19 SURGICAL SUPPLY — 76 items
ADAPTER FEMORAL BOLT NEUTRAL (Knees) ×1 IMPLANT
ADPR FEM 5D STRL KN PFC SGM (Orthopedic Implant) ×1 IMPLANT
ADPR FEM NTRL BOLT STRL KN PFC (Knees) ×1 IMPLANT
BANDAGE ELASTIC 4 VELCRO ST LF (GAUZE/BANDAGES/DRESSINGS) ×2 IMPLANT
BANDAGE ELASTIC 6 VELCRO ST LF (GAUZE/BANDAGES/DRESSINGS) ×2 IMPLANT
BANDAGE ESMARK 6X9 LF (GAUZE/BANDAGES/DRESSINGS) ×1 IMPLANT
BIT DRILL 2.5X110 QC LCP DISP (BIT) ×1 IMPLANT
BIT DRILL Q COUPLING 4.5 (BIT) ×1 IMPLANT
BIT DRILL Q/COUPLING 1 (BIT) ×1 IMPLANT
BIT DRILL QC 3.5X110 (BIT) ×1 IMPLANT
BLADE SAG 18X100X1.27 (BLADE) ×2 IMPLANT
BLADE SAW SAG 90X13X1.27 (BLADE) ×2 IMPLANT
BLADE SURG ROTATE 9660 (MISCELLANEOUS) IMPLANT
BNDG CMPR 9X6 STRL LF SNTH (GAUZE/BANDAGES/DRESSINGS) ×1
BNDG ESMARK 6X9 LF (GAUZE/BANDAGES/DRESSINGS) ×2
BOWL SMART MIX CTS (DISPOSABLE) ×3 IMPLANT
CEMENT HV SMART SET (Cement) ×4 IMPLANT
CLOTH BEACON ORANGE TIMEOUT ST (SAFETY) ×2 IMPLANT
COVER BACK TABLE 24X17X13 BIG (DRAPES) IMPLANT
COVER SURGICAL LIGHT HANDLE (MISCELLANEOUS) ×2 IMPLANT
CUFF TOURNIQUET SINGLE 34IN LL (TOURNIQUET CUFF) ×2 IMPLANT
CUFF TOURNIQUET SINGLE 44IN (TOURNIQUET CUFF) IMPLANT
DISC DIAMOND MED (BURR) IMPLANT
DRAPE EXTREMITY T 121X128X90 (DRAPE) ×2 IMPLANT
DRAPE U-SHAPE 47X51 STRL (DRAPES) ×2 IMPLANT
DRSG PAD ABDOMINAL 8X10 ST (GAUZE/BANDAGES/DRESSINGS) ×1 IMPLANT
DURAPREP 26ML APPLICATOR (WOUND CARE) ×2 IMPLANT
ELECT REM PT RETURN 9FT ADLT (ELECTROSURGICAL) ×2
ELECTRODE REM PT RTRN 9FT ADLT (ELECTROSURGICAL) ×1 IMPLANT
EVACUATOR 1/8 PVC DRAIN (DRAIN) ×2 IMPLANT
FEM TC3 PFC SZ3 LEFT (Orthopedic Implant) ×2 IMPLANT
FEMORAL ADAPTER (Orthopedic Implant) ×1 IMPLANT
FEMORAL TC3 PFC SZ3 LEFT (Orthopedic Implant) IMPLANT
GAUZE XEROFORM 1X8 LF (GAUZE/BANDAGES/DRESSINGS) ×3 IMPLANT
GLOVE BIO SURGEON STRL SZ7.5 (GLOVE) ×2 IMPLANT
GLOVE BIO SURGEON STRL SZ8.5 (GLOVE) ×4 IMPLANT
GLOVE BIOGEL PI IND STRL 8 (GLOVE) ×2 IMPLANT
GLOVE BIOGEL PI IND STRL 9 (GLOVE) ×1 IMPLANT
GLOVE BIOGEL PI INDICATOR 8 (GLOVE) ×2
GLOVE BIOGEL PI INDICATOR 9 (GLOVE) ×1
GOWN PREVENTION PLUS XLARGE (GOWN DISPOSABLE) ×2 IMPLANT
GOWN STRL NON-REIN LRG LVL3 (GOWN DISPOSABLE) ×2 IMPLANT
GOWN STRL REIN XL XLG (GOWN DISPOSABLE) ×4 IMPLANT
HANDPIECE INTERPULSE COAX TIP (DISPOSABLE) ×2
HOOD PEEL AWAY FACE SHEILD DIS (HOOD) ×6 IMPLANT
INSERT TIBIAL TC3 15.0 (Knees) ×1 IMPLANT
KIT BASIN OR (CUSTOM PROCEDURE TRAY) ×2 IMPLANT
KIT ROOM TURNOVER OR (KITS) ×2 IMPLANT
MANIFOLD NEPTUNE II (INSTRUMENTS) ×2 IMPLANT
NS IRRIG 1000ML POUR BTL (IV SOLUTION) ×2 IMPLANT
PACK TOTAL JOINT (CUSTOM PROCEDURE TRAY) ×2 IMPLANT
PAD ARMBOARD 7.5X6 YLW CONV (MISCELLANEOUS) ×4 IMPLANT
PAD CAST 4YDX4 CTTN HI CHSV (CAST SUPPLIES) ×1 IMPLANT
PADDING CAST COTTON 4X4 STRL (CAST SUPPLIES) ×2
PADDING CAST COTTON 6X4 STRL (CAST SUPPLIES) ×2 IMPLANT
PATELLA DOME PFC 38MM (Knees) ×1 IMPLANT
RASP HELIOCORDIAL MED (MISCELLANEOUS) IMPLANT
SCREW CANCEL 4.0 60MM (Screw) ×1 IMPLANT
SCREW CORTEX ST 4.5X44 (Screw) ×1 IMPLANT
SET HNDPC FAN SPRY TIP SCT (DISPOSABLE) ×1 IMPLANT
SPONGE GAUZE 4X4 12PLY (GAUZE/BANDAGES/DRESSINGS) ×4 IMPLANT
STAPLER VISISTAT 35W (STAPLE) ×2 IMPLANT
STEM UNIVERSAL 115X12MM (Stem) ×2 IMPLANT
SUCTION FRAZIER TIP 10 FR DISP (SUCTIONS) ×2 IMPLANT
SUT VIC AB 0 CT1 27 (SUTURE) ×2
SUT VIC AB 0 CT1 27XBRD ANBCTR (SUTURE) ×1 IMPLANT
SUT VIC AB 1 CTX 36 (SUTURE) ×2
SUT VIC AB 1 CTX36XBRD ANBCTR (SUTURE) ×1 IMPLANT
SUT VIC AB 2-0 CT1 27 (SUTURE) ×2
SUT VIC AB 2-0 CT1 TAPERPNT 27 (SUTURE) ×1 IMPLANT
TOWEL OR 17X24 6PK STRL BLUE (TOWEL DISPOSABLE) ×2 IMPLANT
TOWEL OR 17X26 10 PK STRL BLUE (TOWEL DISPOSABLE) ×2 IMPLANT
TRAY FOLEY CATH 14FR (SET/KITS/TRAYS/PACK) ×1 IMPLANT
TRAY REVISION SZ 4 (Knees) ×1 IMPLANT
TUBE ANAEROBIC SPECIMEN COL (MISCELLANEOUS) IMPLANT
WATER STERILE IRR 1000ML POUR (IV SOLUTION) ×6 IMPLANT

## 2013-04-19 NOTE — Interval H&P Note (Signed)
History and Physical Interval Note:  04/19/2013 9:12 AM  Julie Ross  has presented today for surgery, with the diagnosis of LEFT KNEE CEMENT SPACER  The various methods of treatment have been discussed with the patient and family. After consideration of risks, benefits and other options for treatment, the patient has consented to  Procedure(s): TOTAL KNEE REVISION REMOVE SPACER (Left) as a surgical intervention .  The patient's history has been reviewed, patient examined, no change in status, stable for surgery.  I have reviewed the patient's chart and labs.  Questions were answered to the patient's satisfaction.     Nestor Lewandowsky

## 2013-04-19 NOTE — Preoperative (Signed)
Beta Blockers   Reason not to administer Beta Blockers:Not Applicable 

## 2013-04-19 NOTE — Progress Notes (Signed)
04/19/2013 1730 Provided pt with [facility] list. States she had [facility] in the past. Will notify [facility] of HH needed. No DME needed. Isidoro Donning RN CCM Case Mgmt phone [phone]

## 2013-04-19 NOTE — Anesthesia Procedure Notes (Addendum)
Anesthesia Regional Block:  Femoral nerve block  Pre-Anesthetic Checklist: ,, timeout performed, Correct Patient, Correct Site, Correct Laterality, Correct Procedure, Correct Position, site marked, Risks and benefits discussed,  Surgical consent,  Pre-op evaluation,  At surgeon's request and post-op pain management  Laterality: Left  Prep: chloraprep       Needles:  Injection technique: Single-shot  Needle Type: Echogenic Stimulator Needle      Needle Gauge: 21 and 21 G    Additional Needles:  Procedures: ultrasound guided (picture in chart) and nerve stimulator Femoral nerve block  Nerve Stimulator or Paresthesia:  Response: 0.4 mA,   Additional Responses:   Narrative:  Start time: 04/19/2013 8:20 AM End time: 04/19/2013 8:35 AM Injection made incrementally with aspirations every 5 mL.  Performed by: Personally  Anesthesiologist: Arta Bruce MD  Additional Notes: Monitors applied. Patient sedated. Sterile prep and drape,hand hygiene and sterile gloves were used. Relevant anatomy identified.Needle position confirmed.Local anesthetic injected incrementally after negative aspiration. Local anesthetic spread visualized around nerve(s). Vascular puncture avoided. No complications. Image printed for medical record.The patient tolerated the procedure well.       Femoral nerve block Procedure Name: Intubation Date/Time: 04/19/2013 9:28 AM Performed by: Romie Minus K Pre-anesthesia Checklist: Patient identified, Emergency Drugs available, Suction available, Patient being monitored and Timeout performed Patient Re-evaluated:Patient Re-evaluated prior to inductionOxygen Delivery Method: Circle system utilized Preoxygenation: Pre-oxygenation with 100% oxygen Intubation Type: IV induction Ventilation: Mask ventilation without difficulty Laryngoscope Size: Miller and 2 Grade View: Grade I Tube type: Oral Tube size: 7.5 mm Number of attempts: 1 Airway Equipment and  Method: Stylet Placement Confirmation: ETT inserted through vocal cords under direct vision,  positive ETCO2,  CO2 detector and breath sounds checked- equal and bilateral Secured at: 21 cm Tube secured with: Tape Dental Injury: Teeth and Oropharynx as per pre-operative assessment

## 2013-04-19 NOTE — Progress Notes (Signed)
UR COMPLETED  

## 2013-04-19 NOTE — Transfer of Care (Signed)
Immediate Anesthesia Transfer of Care Note  Patient: Julie Ross  Procedure(s) Performed: Procedure(s): TOTAL KNEE REVISION AND REMOVAL CEMENT SPACER (Left)  Patient Location: PACU  Anesthesia Type:General  Level of Consciousness: awake, alert , oriented and patient cooperative  Airway & Oxygen Therapy: Patient Spontanous Breathing and Patient connected to nasal cannula oxygen  Post-op Assessment: Report given to PACU RN and Post -op Vital signs reviewed and stable  Post vital signs: Reviewed  Complications: No apparent anesthesia complications

## 2013-04-19 NOTE — Anesthesia Postprocedure Evaluation (Signed)
Anesthesia Post Note  Patient: Julie Ross  Procedure(s) Performed: Procedure(s) (LRB): TOTAL KNEE REVISION AND REMOVAL CEMENT SPACER (Left)  Anesthesia type: general  Patient location: PACU  Post pain: Pain level controlled  Post assessment: Patient's Cardiovascular Status Stable  Last Vitals:  Filed Vitals:   04/19/13 1416  BP: 122/63  Pulse: 68  Temp: 36.8 C  Resp:     Post vital signs: Reviewed and stable  Level of consciousness: sedated  Complications: No apparent anesthesia complications

## 2013-04-19 NOTE — Op Note (Addendum)
PATIENT ID:      IRIDIAN READER  MRN:     [id number] DOB/AGE:    54-May-1962 / 54 y.o. y.o.       OPERATIVE REPORT    DATE OF PROCEDURE:  04/19/2013       PREOPERATIVE DIAGNOSIS:   LEFT KNEE CEMENT SPACER      Estimated body mass index is 31.32 kg/(m^2) as calculated from the following:   Height as of 04/12/13: 5' 6.5" (1.689 m).   Weight as of 03/15/13: 89.359 kg (197 lb).                                                        POSTOPERATIVE DIAGNOSIS:   left knee cement spacer                                                                      PROCEDURE:  Procedure(s): TOTAL KNEE REVISION AND REMOVAL CEMENT SPACER Using Depuy Revision implants #3L TC Femur,  12x115 stem., #4 MBT Tibia, 19x115 stem, 10mm sigma RP bearing, 38 Patella     SURGEON: Aliza Moret J    ASSISTANT:   Milly Jakob, M.D. (present throughout entire procedure and necessary for timely completion of the procedure)   ANESTHESIA: GET with Femoral Nerve Block  DRAINS: foley, 2 medium hemovac in knee   TOURNIQUET TIME:   COMPLICATIONS:  None     SPECIMENS: None   INDICATIONS FOR PROCEDURE: The patient is status post removal of infected total knee implants, MSSA, with placement of an antibiotic impregnated polymethylmethacrylate spacer a little over 5 months ago. She has had 6 weeks of IV antibiotics, 6 weeks of by mouth antibiotics and has been completely off antibiotics now for 4 weeks. Attempted aspirate of the p.m. and a spacer has yielded negative results, CBC sedimentation rate and CRP are all normal. Patient desires desires reimplantation of total knee arthroplasty probably with either highly constrained components or a hinged based on the condition of the ligaments at surgery. She understands there is at least a 15% chance of recurrence of the infection and accepts the risks. All questions have been encouraged and answered.   DESCRIPTION OF PROCEDURE: The patient identified by armband, received  right femoral  nerve block and IV antibiotics, in the holding area at [facility]. Patient taken to the operating room, appropriate anesthetic  monitors were attached General endotracheal anesthesia induced with  the patient in supine position, Foley catheter was inserted. Tourniquet  applied high to the operative thigh. Lateral post and foot positioner  applied to the table, the lower extremity was then prepped and draped  in usual sterile fashion from the ankle to the tourniquet. Time-out procedure was performed. In order to facilitate a thorough debridement we began the operation without using the tourniquet. We began the operation by making the anterior midline incision starting at handbreadth above the patella going over the patella 1 cm medial to and  6 cm distal to the tibial tubercle. Small bleeders in the skin and the  subcutaneous tissue identified and cauterized. Transverse  retinaculum was incised and reflected medially and a medial parapatellar arthrotomy was accomplished. the patella was everted and prepatellar scar tissue was excised with the electrocautery. The superficial medial collateral  ligament was then elevated from anterior to posterior along the proximal  flare of the tibia. Using an osteotome and curettes the PMMA spacer was removed without difficulty piecemeal. The knee was flexed and further scar tissue was removed to allow motion and anterior translation of the tibia beneath the femur. We continued to  work our way around posteriorly along the proximal tibia, and externally  rotated the tibia subluxing it out from underneath the femur. A McHale  retractor was placed through the notch and a lateral Hohmann retractor  placed, and we then drilled through the proximal tibia in line with the  axis of the tibia followed by an intramedullary guide rod and 2-degree  posterior slope cutting guide, to allow the use of stemmed implants. The tibial cutting guide was pinned into place   allowing resection of a small amount of bone to freshen up the tibial cut. Satisfied with the tibial resection, we then  reammed the tibia up to 1mm, for a 11x115 mm stem. This was followed by the conical reamer. We then sized for a 4 tibial base plate and applied the delta fin keel punch with the 10 mm x 115extension.  We then entered the distal femur in line with the intramedullary canal with the pilot reamer and then reamed up to 12 mm for a 115 stem. This was pinned along the  epicondylar axis. At this point, the distal femoral cleanup cut was accomplished without difficulty. We then sized for a 3L TC3 femoral component and pinned the guide along the epicondylar axis.The chamfer cutting guide was pinned into place. The anterior, posterior, and chamfer cuts were accomplished without difficulty followed by  the TC 3 box cutting guide and the box cut. We also removed posterior osteophytes from the posterior femoral condyles. At this  time, the knee was brought into full extension. We checked our  extension and flexion gaps and found them symmetric at 15mm.  The patella was then everted with towel clips and a brush cut accomplished removing small peripheral osteophytes behind a patellar thickness of about 13 mm. This was sized for 38 button and reach redrilled. The knee was then once again hyperflexed exposing the proximal tibia and a trial #3 tibial base plate with a 10 x 115 stem was hammered into place. We then hammered into place the TC 3 RP trial femoral component, with a 12 x 115 trial stem. We then inserted a 15-mm trial bearing, trial patellar button, and took the knee through range of motion from 0-130 degrees. a lateral release was performed to help the patella center better as she took it for range of motion. The limb was wrapped with an Esmarch bandage and the tourniquet inflated to 350 mmHg with a trial components in place. At this point, all trial components were removed, a double batch of  DePuy HV cement with 1500 mg of Zinacef was mixed and applied to all bony metallic mating surfaces except for the posterior condyles of the femur itself. In order, we  hammered into place the tibial tray and stem and removed excess cement, the femoral component with stem. As the femoral component with stem was hammered into place a fracture lateral femoral condyle was noted the implants removed and the cement was also removed from the femur the tibia was left  in place in the cement hardened. We then brought up the Synthes small and large fragment screw sets Corriveau place anterior and posterior screws through the lateral femoral condyle and lag it back onto the femur getting good purchase on the contralateral side with the screws. The screws were placed such that it did not interfere with the stem of the real implant. At this time a new double batch of DePuy HV cement with 1500 mg of Zinacef was mixed and applied to the bony metallic mating surfaces on the femoral side and the femoral component was reinserted and no displacement of the lateral femoral condyle was noted with grouped screw fixation. The patellar component was also cemented into place at this time. A 15 mm TC 3 RP bearing  was inserted, and the knee brought to full extension with compression.  The patellar button was clamped into place, and excess cement  removed. While the cement cured the wound was irrigated out with normal saline solution pulse lavage, and medium Hemovac drains were placed from an anterolateral  approach. Ligament stability and patellar tracking were checked and found to be excellent. No displacement lateral femoral condyle is noted. The parapatellar arthrotomy was closed with  running #1 Vicryl suture. The subcutaneous tissue with 0 and 2-0 undyed  Vicryl suture, and the skin with skin staples. A dressing of Xeroform,  4 x 4, dressing sponges, Webril, and Ace wrap applied. The patient  awakened, extubated, and taken to  recovery room without difficulty.  Darcel Bayley J 04/19/2013, 12:32 PM

## 2013-04-19 NOTE — Anesthesia Preprocedure Evaluation (Addendum)
Anesthesia Evaluation  Patient identified by MRN, date of birth, ID band Patient awake    Reviewed: Allergy & Precautions, H&P , NPO status , Patient's Chart, lab work & pertinent test results, reviewed documented beta blocker date and time   History of Anesthesia Complications Negative for: history of anesthetic complications  Airway Mallampati: III TM Distance: >3 FB Neck ROM: Full    Dental  (+) Teeth Intact and Dental Advisory Given   Pulmonary sleep apnea ,  breath sounds clear to auscultation        Cardiovascular hypertension, Pt. on medications Rhythm:Regular Rate:Normal     Neuro/Psych PSYCHIATRIC DISORDERS Anxiety Depression MS  Neuromuscular disease    GI/Hepatic Neg liver ROS, GERD-  Medicated and Controlled,  Endo/Other  Hypothyroidism   Renal/GU negative Renal ROS     Musculoskeletal   Abdominal   Peds  Hematology negative hematology ROS (+)   Anesthesia Other Findings   Reproductive/Obstetrics negative OB ROS                          Anesthesia Physical Anesthesia Plan  ASA: III  Anesthesia Plan: General   Post-op Pain Management:    Induction: Intravenous  Airway Management Planned: Oral ETT  Additional Equipment:   Intra-op Plan:   Post-operative Plan: Extubation in OR  Informed Consent: I have reviewed the patients History and Physical, chart, labs and discussed the procedure including the risks, benefits and alternatives for the proposed anesthesia with the patient or authorized representative who has indicated his/her understanding and acceptance.     Plan Discussed with: CRNA and Surgeon  Anesthesia Plan Comments:         Anesthesia Quick Evaluation

## 2013-04-20 LAB — CBC
HCT: 23.1 % — ABNORMAL LOW (ref 36.0–46.0)
Hemoglobin: 7.8 g/dL — ABNORMAL LOW (ref 12.0–15.0)
MCH: 29.2 pg (ref 26.0–34.0)
MCHC: 33.8 g/dL (ref 30.0–36.0)
MCV: 86.5 fL (ref 78.0–100.0)
Platelets: 176 10*3/uL (ref 150–400)
RBC: 2.67 MIL/uL — ABNORMAL LOW (ref 3.87–5.11)
RDW: 13.4 % (ref 11.5–15.5)
WBC: 7.7 10*3/uL (ref 4.0–10.5)

## 2013-04-20 MED ORDER — OXYCODONE HCL ER 20 MG PO T12A
20.0000 mg | EXTENDED_RELEASE_TABLET | Freq: Two times a day (BID) | ORAL | Status: DC
  Administered 2013-04-20 – 2013-04-23 (×5): 20 mg via ORAL
  Filled 2013-04-20 (×6): qty 2
  Filled 2013-04-20: qty 1

## 2013-04-20 NOTE — Progress Notes (Addendum)
Patient ID: Julie Ross, female   DOB: 01-11-1961, 52 y.o.   MRN: [id number] PATIENT ID: Julie Ross  MRN: [id number]  DOB/AGE:  09/20/1960 / 52 y.o.  1 Day Post-Op Procedure(s) (LRB): TOTAL KNEE REVISION AND REMOVAL CEMENT SPACER (Left)    PROGRESS NOTE Subjective: Patient is alert, oriented, no Nausea, no Vomiting, yes passing gas, no Bowel Movement. Taking PO well. Denies SOB, Chest or Calf Pain. Using Incentive Spirometer, PAS in place. Ambulate patient may ambulate 50% weightbearing on the left lower trembly with physical therapy, this is her revision after previous infection they'll be no passive motion and no CPM active motion is encouraged. Patient reports pain as 8 on 0-10 scale, she normally takes OxyContin 20 mg by mouth twice a day for pain management and we will restart that today she will also have the OxyIR for breakthrough pain .    Objective: Vital signs in last 24 hours: Filed Vitals:   04/20/13 0000 04/20/13 0138 04/20/13 0400 04/20/13 0515  BP:  130/55  143/55  Pulse:  94  96  Temp:  99.4 F (37.4 C)  99.4 F (37.4 C)  TempSrc:      Resp: 15 16 16 16   SpO2: 95% 93% 94% 97%      Intake/Output from previous day: I/O last 3 completed shifts: In: 3812.5 [I.V.:3212.5; IV Piggyback:600] Out: 3250 [Urine:2425; Drains:225; Blood:600]   Intake/Output this shift:     LABORATORY DATA:  Recent Labs  04/20/13 0523  WBC 7.7  HGB 7.8*  HCT 23.1*  PLT 176    Examination: Neurologically intact ABD soft Neurovascular intact Sensation intact distally Intact pulses distally Dorsiflexion/Plantar flexion intact Incision: scant drainage No cellulitis present Compartment soft} Blood and plasma separated in drain indicating minimal recent drainage, drain pulled without difficulty. Postoperative x-rays show well-placed well fixed stem the femoral and tibial implants as well as screws used for fixation of the lateral femoral condyle fracture that occurred  intraoperatively secondary to previous ostial lysis Assessment:   1 Day Post-Op Procedure(s) (LRB): TOTAL KNEE REVISION AND REMOVAL CEMENT SPACER (Left) ADDITIONAL DIAGNOSIS:  Acute Blood Loss Anemia, Hypertension and Multiple sclerosis  Plan: PT/OT WBAT, CPM 5/hrs day until ROM 0-90 degrees, then D/C CPM, patient denies any weakness or dizziness and we will monitor her hemoglobin closely, if she becomes symptomatic we will transfuse her. DVT Prophylaxis:  SCDx72hrs, ASA 325 mg BID x 2 weeks DISCHARGE PLAN: Inpatient Rehab , patient does have history of active multiple sclerosis, and she also has severe arthritis of the right knee. DISCHARGE NEEDS: HHPT, HHRN, CPM, Walker and 3-in-1 comode seat     Jaasiel Hollyfield J 04/20/2013, 8:15 AM

## 2013-04-20 NOTE — Evaluation (Signed)
Physical Therapy Evaluation Patient Details Name: Julie Ross MRN: [id number] DOB: 1960/07/21 Today's Date: 04/20/2013 Time: 1191-4782 PT Time Calculation (min): 50 min  PT Assessment / Plan / Recommendation History of Present Illness  Pt s/p L knee revision. Pt with history of MS and recent chemo treatment for breast Ca.   Clinical Impression  Pt is s/p L total knee revision resulting in the deficits listed below (see PT Problem List). Pt strongly desires to go to CIR as she did well last time she was there in 02/2012. Pt motivated and strongly desired to return to mod I function. Anticipate pt to achieve mod I in CIR.  Pt will benefit from skilled PT to increase their independence and safety with mobility to allow discharge to the venue listed below.      PT Assessment  Patient needs continued PT services    Follow Up Recommendations  CIR    Does the patient have the potential to tolerate intense rehabilitation      Barriers to Discharge        Equipment Recommendations  None recommended by PT    Recommendations for Other Services Rehab consult   Frequency 7X/week    Precautions / Restrictions Precautions Precautions: Knee Precaution Comments: no PROM or CPM to L knee, pt with R knee in poor condition as well Required Braces or Orthoses:  (wears brace onR knee for support due to severe arthritis) Restrictions Weight Bearing Restrictions: Yes LLE Weight Bearing: Partial weight bearing LLE Partial Weight Bearing Percentage or Pounds: 50   Pertinent Vitals/Pain 7/10 L knee pain      Mobility  Bed Mobility Bed Mobility: Supine to Sit;Sitting - Scoot to Edge of Bed Supine to Sit: 3: Mod assist;With rails;HOB elevated Sitting - Scoot to Edge of Bed: 3: Mod assist;With rail Details for Bed Mobility Assistance: assist for L LE management Transfers Transfers: Sit to Stand;Stand to Sit;Stand Pivot Transfers Sit to Stand: 3: Mod assist;With upper extremity assist;From bed  (minA from chair/3n1) Stand to Sit: 4: Min assist;With upper extremity assist;To chair/3-in-1;To toilet Stand Pivot Transfers: 4: Min assist (with RW to/from [facility] - pt indep with hygiene) Details for Transfer Assistance: significant bilat UE WBing, decreased R LE WBing due to instability and pt compliant with L LE 50% WBing Ambulation/Gait Ambulation/Gait Assistance: 4: Min assist Ambulation Distance (Feet): 5 Feet Assistive device: Rolling walker Ambulation/Gait Assistance Details: v/c's for sequencing, signifcant UE WBing due to PWB on L LE and dec R LE wbing tolerance due to instability and pain Gait Pattern: Step-to pattern;Decreased stance time - right;Decreased stance time - left;Decreased step length - right;Decreased step length - left;Wide base of support Gait velocity: slow Stairs: No    Exercises Total Joint Exercises Ankle Circles/Pumps: AROM;Both;10 reps;Supine Quad Sets: AROM;Left;10 reps;Supine   PT Diagnosis: Difficulty walking;Generalized weakness  PT Problem List: Decreased strength;Decreased activity tolerance;Decreased mobility PT Treatment Interventions: DME instruction;Gait training;Functional mobility training;Stair training;Therapeutic activities;Therapeutic exercise     PT Goals(Current goals can be found in the care plan section) Acute Rehab PT Goals Patient Stated Goal: inpatient rehab then home PT Goal Formulation: With patient Time For Goal Achievement: 04/27/13 Potential to Achieve Goals: Good  Visit Information  Last PT Received On: 04/20/13 Assistance Needed: +1 History of Present Illness: Pt s/p L knee revision       Prior Functioning  Home Living Family/patient expects to be discharged to:: Skilled nursing facility Living Arrangements: Spouse/significant other Additional Comments: spouse works during the day Prior Function  Level of Independence: Needs assistance Gait / Transfers Assistance Needed: uses RW ADL's / Homemaking Assistance  Needed: assist for shower/dressing, seat in shower, unable to bend L knee due to spacer Comments: pt uses RW Communication Communication: No difficulties Dominant Hand: Right    Cognition  Cognition Arousal/Alertness: Awake/alert Behavior During Therapy: WFL for tasks assessed/performed Overall Cognitive Status: Within Functional Limits for tasks assessed    Extremity/Trunk Assessment Upper Extremity Assessment Upper Extremity Assessment: Overall WFL for tasks assessed Lower Extremity Assessment Lower Extremity Assessment: RLE deficits/detail;LLE deficits/detail RLE Deficits / Details: pt with active R knee flexion to 30 deg, grossly 3-/5 LLE Deficits / Details: ankle WFL, no ROM to knee per MD orders Cervical / Trunk Assessment Cervical / Trunk Assessment: Normal   Balance    End of Session PT - End of Session Equipment Utilized During Treatment: Gait belt Activity Tolerance: Patient limited by fatigue Patient left: in chair;with call bell/phone within reach Nurse Communication: Mobility status  GP     Marcene Brawn 04/20/2013, 11:21 AM  Lewis Shock, PT, DPT Pager #: [phone] Office #: [phone]

## 2013-04-20 NOTE — Evaluation (Signed)
Occupational Therapy Evaluation Patient Details Name: Julie Ross MRN: [id number] DOB: 05/13/1961 Today's Date: 04/20/2013 Time: 4098-1191 OT Time Calculation (min): 26 min  OT Assessment / Plan / Recommendation History of present illness Pt s/p L knee revision. H/o MS and recent chemo treatment for breast cancer.    Clinical Impression   Pt admitted with above.  Pt will benefit from continued acute OT services to address below problem list. Pt is very motivated to participate in therapy and would greatly benefit from CIR to further progress rehab before eventual return home.    OT Assessment  Patient needs continued OT Services    Follow Up Recommendations  CIR;Supervision/Assistance - 24 hour    Barriers to Discharge      Equipment Recommendations  3 in 1 bedside comode    Recommendations for Other Services Rehab consult  Frequency  Min 2X/week    Precautions / Restrictions Precautions Precautions: Knee Precaution Comments: no PROM or CPM to L knee, pt with R knee in poor condition as well Required Braces or Orthoses:  (wears brace onR knee for support due to severe arthritis) Restrictions Weight Bearing Restrictions: Yes LLE Weight Bearing: Partial weight bearing LLE Partial Weight Bearing Percentage or Pounds: 50   Pertinent Vitals/Pain See vitals    ADL  Eating/Feeding: Performed;Independent Where Assessed - Eating/Feeding: Chair Grooming: Performed;Wash/dry hands;Supervision/safety Where Assessed - Grooming: Unsupported sitting Upper Body Bathing: Simulated;Supervision/safety Where Assessed - Upper Body Bathing: Unsupported sitting Lower Body Bathing: Simulated;Maximal assistance Where Assessed - Lower Body Bathing: Supported sit to stand Upper Body Dressing: Simulated;Supervision/safety Where Assessed - Upper Body Dressing: Unsupported sitting Lower Body Dressing: Performed;Maximal assistance Where Assessed - Lower Body Dressing: Supported sit to  Pharmacist, hospital: Performed;Moderate assistance Toilet Transfer Method: Stand pivot Toilet Transfer Equipment: Bedside commode Toileting - Clothing Manipulation and Hygiene: Performed;Minimal assistance Where Assessed - Engineer, mining and Hygiene: Sit to stand from 3-in-1 or toilet Equipment Used: Gait belt;Rolling walker Transfers/Ambulation Related to ADLs: Mod assist for SPT chair<>3n1. ADL Comments: Pt very painful during session, requiring incr time for transfer due to pain.    OT Diagnosis: Generalized weakness;Acute pain  OT Problem List: Decreased strength;Decreased activity tolerance;Impaired balance (sitting and/or standing);Decreased knowledge of use of DME or AE;Decreased knowledge of precautions;Pain OT Treatment Interventions: Self-care/ADL training;DME and/or AE instruction;Therapeutic activities;Patient/family education;Balance training   OT Goals(Current goals can be found in the care plan section) Acute Rehab OT Goals Patient Stated Goal: inpatient rehab then home OT Goal Formulation: With patient Time For Goal Achievement: 05/04/13 Potential to Achieve Goals: Good  Visit Information  Last OT Received On: 04/20/13 Assistance Needed: +1 History of Present Illness: Pt s/p L knee revision       Prior Functioning     Home Living Family/patient expects to be discharged to:: Skilled nursing facility Living Arrangements: Spouse/significant other Additional Comments: spouse works during the day Prior Function Level of Independence: Needs assistance Gait / Transfers Assistance Needed: uses RW ADL's / Homemaking Assistance Needed: assist for shower/dressing, seat in shower, unable to bend L knee due to spacer Comments: pt uses RW Communication Communication: No difficulties Dominant Hand: Right         Vision/Perception     Cognition  Cognition Arousal/Alertness: Awake/alert Behavior During Therapy: WFL for tasks  assessed/performed Overall Cognitive Status: Within Functional Limits for tasks assessed    Extremity/Trunk Assessment Upper Extremity Assessment Upper Extremity Assessment: Overall WFL for tasks assessed     Mobility Bed Mobility Bed Mobility: Not  assessed (pt up in chair) Transfers Transfers: Sit to Stand;Stand to Sit Sit to Stand: 3: Mod assist;From chair/3-in-1;With upper extremity assist;With armrests Stand to Sit: 4: Min assist;To chair/3-in-1;With armrests;With upper extremity assist Details for Transfer Assistance: significant bilat UE WBing, decreased R LE WBing due to instability and pt compliant with L LE 50% WBing     Exercise     Balance     End of Session OT - End of Session Equipment Utilized During Treatment: Gait belt;Rolling walker Activity Tolerance: Patient tolerated treatment well Patient left: in chair;with call bell/phone within reach Nurse Communication: Mobility status;Patient requests pain meds  GO   04/20/2013 Cipriano Mile OTR/L Pager [phone] Office [phone]    Cipriano Mile 04/20/2013, 5:13 PM

## 2013-04-21 ENCOUNTER — Inpatient Hospital Stay (HOSPITAL_COMMUNITY): Payer: 59

## 2013-04-21 LAB — CBC
HCT: 23.2 % — ABNORMAL LOW (ref 36.0–46.0)
Hemoglobin: 7.9 g/dL — ABNORMAL LOW (ref 12.0–15.0)
MCH: 29.4 pg (ref 26.0–34.0)
MCHC: 34.1 g/dL (ref 30.0–36.0)
MCV: 86.2 fL (ref 78.0–100.0)
Platelets: 202 10*3/uL (ref 150–400)
RBC: 2.69 MIL/uL — ABNORMAL LOW (ref 3.87–5.11)
RDW: 13.6 % (ref 11.5–15.5)
WBC: 10.4 10*3/uL (ref 4.0–10.5)

## 2013-04-21 LAB — PREPARE RBC (CROSSMATCH)

## 2013-04-21 NOTE — Progress Notes (Signed)
PATIENT ID: Julie Ross  MRN: [id number]  DOB/AGE:  12/22/1960 / 52 y.o.  2 Days Post-Op Procedure(s) (LRB): TOTAL KNEE REVISION AND REMOVAL CEMENT SPACER (Left)    PROGRESS NOTE Subjective: Patient is alert, oriented, mild Nausea, no Vomiting, yes passing gas, no Bowel Movement. Taking PO well. Denies SOB, Chest or Calf Pain. Using Incentive Spirometer, PAS in place. Ambulate patient may ambulate 50% weightbearing on the left lower trembly with physical therapy, this is her revision after previous infection there will be no passive motion and no CPM.   Active motion is encouraged  Patient reports pain as 8 on 0-10 scale  .    Objective: Vital signs in last 24 hours: Filed Vitals:   04/20/13 0400 04/20/13 0515 04/20/13 2200 04/21/13 0549  BP:  143/55 105/41 103/45  Pulse:  96 88 95  Temp:  99.4 F (37.4 C) 99.2 F (37.3 C) 98.7 F (37.1 C)  TempSrc:    Oral  Resp: 16 16 16 16   SpO2: 94% 97% 94% 94%      Intake/Output from previous day: I/O last 3 completed shifts: In: 1902.5 [P.O.:240; I.V.:1562.5; IV Piggyback:100] Out: 2128 [Urine:2003; Drains:125]   Intake/Output this shift:     LABORATORY DATA:  Recent Labs  04/20/13 0523 04/21/13 0536  WBC 7.7 10.4  HGB 7.8* 7.9*  HCT 23.1* 23.2*  PLT 176 202    Examination: Neurologically intact Neurovascular intact Sensation intact distally Intact pulses distally Dorsiflexion/Plantar flexion intact Incision: no drainage and scant drainage Compartment soft}  Assessment:   2 Days Post-Op Procedure(s) (LRB): TOTAL KNEE REVISION AND REMOVAL CEMENT SPACER (Left) ADDITIONAL DIAGNOSIS:  Acute Blood Loss Anemia, Hypertension and multiple sclerosis  Plan: PT/OT Weight bearing at 50 % on the left.  Pt may do active ROM but no passive ROM. DVT Prophylaxis:  SCDx72hrs, ASA 325 mg BID x 2 weeks DISCHARGE PLAN: Inpatient Rehab when available, patient does have history of active multiple sclerosis, and she also has severe  arthritis of the right knee.  DISCHARGE NEEDS: HHPT, HHRN, Walker and 3-in-1 comode seat  Anemia - pt's level is trending up, so unless pt becomes symptomatic we will not transfuse.     Tyrene Nader R 04/21/2013, 7:41 AM

## 2013-04-21 NOTE — Progress Notes (Signed)
Physical Therapy Treatment Patient Details Name: Julie Ross MRN: [id number] DOB: [birth year] Today's Date: 04/21/2013 Time: 4098-1191 PT Time Calculation (min): 35 min  PT Assessment / Plan / Recommendation  History of Present Illness Pt s/p L knee revision   PT Comments   Pt progressing well. Pt motivated despite multiple co-morbidities. Pt con't to be appropriate for CIR, however if unable to go to CIR pt will need ST-SNF placement to achieve safe mod I function.   Follow Up Recommendations  CIR     Does the patient have the potential to tolerate intense rehabilitation     Barriers to Discharge        Equipment Recommendations  None recommended by PT    Recommendations for Other Services Rehab consult  Frequency 7X/week   Progress towards PT Goals Progress towards PT goals: Progressing toward goals  Plan Current plan remains appropriate    Precautions / Restrictions Precautions Precautions: Knee Precaution Comments: no PROM or CPM to L knee, pt with R knee in poor condition as well Restrictions LLE Weight Bearing: Partial weight bearing LLE Partial Weight Bearing Percentage or Pounds: 50%   Pertinent Vitals/Pain 8/10 L knee pain    Mobility  Bed Mobility Bed Mobility: Supine to Sit Supine to Sit: 4: Min assist;HOB elevated Details for Bed Mobility Assistance: L LE managment Transfers Transfers: Sit to Stand;Stand to Sit;Stand Pivot Transfers Sit to Stand: 4: Min assist;With upper extremity assist;From bed;3: Mod assist (minA from chair/3n1 due to bilat arm rails) Stand to Sit: 4: Min assist;To chair/3-in-1;With armrests;With upper extremity assist Details for Transfer Assistance: significant bilat UE WBing, decreased R LE WBing due to instability and pt compliant with L LE 50% WBing Ambulation/Gait Ambulation/Gait Assistance: 4: Min assist Ambulation Distance (Feet): 10 Feet Assistive device: Rolling walker Ambulation/Gait Assistance Details: limited by  nausea/lightheadedness and R LE pain. Pt donned R knee brace Gait Pattern: Step-to pattern;Decreased stance time - right;Decreased stance time - left;Decreased step length - right;Decreased step length - left;Wide base of support Gait velocity: slow    Exercises Total Joint Exercises Ankle Circles/Pumps: AROM;Both;10 reps;Supine Quad Sets: AROM;Left;10 reps;Supine Heel Slides: AROM;Left;15 reps;Supine (achieved 20 degree knee flexion)   PT Diagnosis:    PT Problem List:   PT Treatment Interventions:     PT Goals (current goals can now be found in the care plan section) Acute Rehab PT Goals Patient Stated Goal: inpatient rehab then home  Visit Information  Last PT Received On: 04/21/13 Assistance Needed: +1 History of Present Illness: Pt s/p L knee revision    Subjective Data  Patient Stated Goal: inpatient rehab then home   Cognition  Cognition Arousal/Alertness: Awake/alert Behavior During Therapy: WFL for tasks assessed/performed Overall Cognitive Status: Within Functional Limits for tasks assessed    Balance     End of Session PT - End of Session Equipment Utilized During Treatment: Gait belt Activity Tolerance: Patient limited by fatigue (limited by nausea) Patient left: in chair;with call bell/phone within reach Nurse Communication: Mobility status   GP     Marcene Brawn 04/21/2013, 10:23 AM  Lewis Shock, PT, DPT Pager #: [phone] Office #: [phone]

## 2013-04-21 NOTE — Progress Notes (Signed)
Patient with complaints of dizziness and weakness this afternoon.  Hgb is 7.9, spoke with Dannielle Burn, PA, order to transfuse 2 units of PRBCs.  Transfusion started, no s/s of transfusion reaction.  Nursing will continue to monitor per protocol.

## 2013-04-21 NOTE — Progress Notes (Addendum)
Clinical Social Work Department CLINICAL SOCIAL WORK PLACEMENT NOTE 04/21/2013  Patient:  Julie Ross, Julie Ross  Account Number:  [id number] Admit date:  04/19/2013  Clinical Social Worker:  Jetta Lout, Theresia Majors  Date/time:  04/21/2013 04:04 PM  Clinical Social Work is seeking post-discharge placement for this patient at the following level of care:   SKILLED NURSING   (*CSW will update this form in Epic as items are completed)   04/21/2013  Patient/family provided with [name] Department of Clinical Social Work's list of facilities offering this level of care within the geographic area requested by the patient (or if unable, by the patient's family).  04/21/2013  Patient/family informed of their freedom to choose among providers that offer the needed level of care, that participate in Medicare, Medicaid or managed care program needed by the patient, have an available bed and are willing to accept the patient.  04/21/2013  Patient/family informed of [facility]' ownership interest in [facility], as well as of the fact that they are under no obligation to receive care at this facility.  PASARR submitted to [employer] on 04/21/2013 PASARR number received from [employer] on   FL2 transmitted to all facilities in geographic area requested by pt/family on  04/21/2013 FL2 transmitted to all facilities within larger geographic area on   Patient informed that his/her managed care company has contracts with or will negotiate with  certain facilities, including the following:     Patient/family informed of bed offers received:  04/23/2013 Patient chooses bed at [facility] Physician recommends and patient chooses bed at    Patient to be transferred to  on  04/23/2013 Patient to be transferred to facility by [facility]  The following physician request were entered in Epic:   Additional Comments: Patient's first choice is [facility].

## 2013-04-21 NOTE — Progress Notes (Signed)
Clinical Social Work Department BRIEF PSYCHOSOCIAL ASSESSMENT 04/21/2013  Patient:  Julie Ross, Julie Ross     Account Number:  [id number]     Admit date:  04/19/2013  Clinical Social Worker:  Hendricks Milo  Date/Time:  04/21/2013 04:01 PM  Referred by:  Physician  Date Referred:  04/21/2013 Referred for  SNF Placement   Other Referral:   Interview type:  Patient Other interview type:    PSYCHOSOCIAL DATA Living Status:  HUSBAND Admitted from facility:   Level of care:   Primary support name:  Norville Haggard Primary support relationship to patient:  SPOUSE Degree of support available:   Very supportive.    CURRENT CONCERNS  Other Concerns:    SOCIAL WORK ASSESSMENT / PLAN Clinical Social Worker (CSW) met with patient to discuss SNF placement as a back up to CIR. Patient agreeable to SNF search. Her preference is [facility].   Assessment/plan status:   Other assessment/ plan:   Information/referral to community resources:   CSW gave patient SNF list.    PATIENT'S/FAMILY'S RESPONSE TO PLAN OF CARE: Patient appreciative of CSW visit.

## 2013-04-22 DIAGNOSIS — Z96659 Presence of unspecified artificial knee joint: Secondary | ICD-10-CM

## 2013-04-22 DIAGNOSIS — T847XXA Infection and inflammatory reaction due to other internal orthopedic prosthetic devices, implants and grafts, initial encounter: Secondary | ICD-10-CM

## 2013-04-22 LAB — CBC
HCT: 26.4 % — ABNORMAL LOW (ref 36.0–46.0)
Hemoglobin: 9.2 g/dL — ABNORMAL LOW (ref 12.0–15.0)
MCH: 29.1 pg (ref 26.0–34.0)
MCHC: 34.8 g/dL (ref 30.0–36.0)
MCV: 83.5 fL (ref 78.0–100.0)
Platelets: 179 10*3/uL (ref 150–400)
RBC: 3.16 MIL/uL — ABNORMAL LOW (ref 3.87–5.11)
RDW: 13.3 % (ref 11.5–15.5)
WBC: 10.1 10*3/uL (ref 4.0–10.5)

## 2013-04-22 LAB — TYPE AND SCREEN
ABO/RH(D): O POS
Antibody Screen: NEGATIVE
Unit division: 0
Unit division: 0

## 2013-04-22 LAB — TISSUE CULTURE: Culture: NO GROWTH

## 2013-04-22 MED ORDER — OXYCODONE-ACETAMINOPHEN 5-325 MG PO TABS: 1.0000 | ORAL_TABLET | ORAL | Status: DC | PRN

## 2013-04-22 MED ORDER — METHOCARBAMOL 500 MG PO TABS: 500.0000 mg | ORAL_TABLET | Freq: Two times a day (BID) | ORAL | Status: DC

## 2013-04-22 MED ORDER — ASPIRIN EC 325 MG PO TBEC: 325.0000 mg | DELAYED_RELEASE_TABLET | Freq: Two times a day (BID) | ORAL | Status: DC

## 2013-04-22 NOTE — Progress Notes (Signed)
PATIENT ID: Julie Ross  MRN: [id number]  DOB/AGE:  27-Jan-1961 / 52 y.o.  3 Days Post-Op Procedure(s) (LRB): TOTAL KNEE REVISION AND REMOVAL CEMENT SPACER (Left)    PROGRESS NOTE Subjective: Patient is alert, oriented, no Nausea, no Vomiting, yes passing gas, no Bowel Movement. Taking PO well as pt up eating in room. Denies SOB, Chest or Calf Pain. Using Incentive Spirometer, PAS in place. Ambulate patient may ambulate 50% weightbearing on the left lower extremity with physical therapy, this is her revision after previous infection there will be no passive motion and no CPM. Active motion is encouraged  Patient reports pain as moderate  .    Objective: Vital signs in last 24 hours: Filed Vitals:   04/21/13 2300 04/22/13 0005 04/22/13 0105 04/22/13 0542  BP: 108/65 123/45 117/62 109/49  Pulse: 71 73 73 69  Temp: 98.4 F (36.9 C) 98.2 F (36.8 C) 99 F (37.2 C) 98.4 F (36.9 C)  TempSrc:      Resp: 20 18 16 17   SpO2: 99% 98% 98% 98%      Intake/Output from previous day: I/O last 3 completed shifts: In: 572.5 [P.O.:360; I.V.:200; Blood:12.5] Out: 3 [Urine:3]   Intake/Output this shift:     LABORATORY DATA:  Recent Labs  04/21/13 0536 04/22/13 0505  WBC 10.4 10.1  HGB 7.9* 9.2*  HCT 23.2* 26.4*  PLT 202 179    Examination: Neurologically intact ABD soft Neurovascular intact Sensation intact distally Intact pulses distally Dorsiflexion/Plantar flexion intact Incision: scant drainage No cellulitis present Compartment soft}  Assessment:   3 Days Post-Op Procedure(s) (LRB): TOTAL KNEE REVISION AND REMOVAL CEMENT SPACER (Left) ADDITIONAL DIAGNOSIS:  Acute Blood Loss Anemia, HTN, Multiple Sclerosis  Plan: PT/OT Weight bearing at 50 % on the left. Pt may do active ROM but no passive ROM. DVT Prophylaxis:  SCDx72hrs, ASA 325 mg BID x 2 weeks DISCHARGE PLAN: Inpatient Rehab if placement is accepted.  If not, pt is agreeable to go to [facility] NEEDS:  HHPT, HHRN, Walker and 3-in-1 comode seat  Pt looks much better after the transfusion of 2 units last night, and we will continue to monitor her constitutional symptoms.     Daun Rens R 04/22/2013, 8:08 AM

## 2013-04-22 NOTE — Consult Note (Signed)
Physical Medicine and Rehabilitation Consult Reason for Consult: Left knee revision/history of multiple sclerosis Referring Physician: Dr. Turner Daniels   HPI: Julie Ross is a 52 y.o. right handed female with history of breast cancer And recent chemotherapy as well as multiple sclerosis diagnosed 72. Patient with history of left total knee arthroplasty February 2013 complicated by knee infection/MSSA with placement of antibiotic spacer and completion of 6 weeks intravenous antibiotics followed by 6 weeks of by mouth antibiotics completed 4 weeks ago. Patient received inpatient rehabilitation services 03/09/1999 1310 03/16/2012 for septic knee. Admitted 04/18/2013 and underwent revision left total knee and removal of cemented spacer 04/19/2013 per Dr. Turner Daniels. Patient is partial weightbearing left lower extremity. Postoperative pain control. Acute blood loss anemia 7.8 transfused with latest hemoglobin 9.2. Physical and occupational therapy evaluations completed with recommendations for physical medicine rehabilitation consult to consider inpatient rehabilitation services  Review of Systems  Gastrointestinal:       GERD  Musculoskeletal: Positive for joint pain and myalgias.  Psychiatric/Behavioral: Positive for depression.       Anxiety  All other systems reviewed and are negative.   Past Medical History  Diagnosis Date  . Hypertension   . Hypothyroidism   . Neuromuscular disorder     MS TX WITH CYMBALTA   . Multiple sclerosis   . H/O colonoscopy   . H/O bone density study 03/2011  . Wears glasses   . Heart burn   . Depression   . Hx of radiation therapy 04/17/12- 06/04/12    left chest wall, axilla, L supraclavicular fossa 4500 cGy, left mastectomy scar/chest wall 5940 cGy  . H/O echocardiogram     last one 02/2013, due to chemotherapy  . Anxiety     uses xanax mainly for sleep  . Sleep apnea     MILD SLEEP APNEA, NO (Needed) MACHINE  6 YRS AGO HPT [facility]   . GERD (gastroesophageal  reflux disease)   . Breast cancer 11/2011    lul/ER+PR+, x1 lymph node   . Cancer     squamous cell skin cancer x7  . Arthritis     knees   . Anemia     h/o - ? bld. transfusion - more recent- ?2013   Past Surgical History  Procedure Laterality Date  . Tonsillectomy    . Tumor removal      FROM PELVIS AGE 35  . Cesarean section      X2   . No past surgeries      BLADDER SLING  4-5 YRS AGO   . Knee arthroscopy      LT KNEE 04/2011  . Total knee arthroplasty  08/15/2011    Procedure: TOTAL KNEE ARTHROPLASTY; lft Surgeon: Harvie Junior, MD;  Location: [facility] OR;  Service: Orthopedics;  Laterality: Left;  RIGHT KNEE CORTIZONE INJECTION  . Mohs surgery      right face  . Knee arthroscopy  82    rt  . Mastectomy w/ sentinel node biopsy  12/19/2011    Procedure: MASTECTOMY WITH SENTINEL LYMPH NODE BIOPSY;  Surgeon: Currie Paris, MD;  Location: [facility] OR;  Service: General;  Laterality: Left;  left breast and left axilla   . Portacath placement  01/16/2012    Procedure: INSERTION PORT-A-CATH;  Surgeon: Currie Paris, MD;  Location: [facility];  Service: General;  Laterality: Right;  Porta Cath Placement   . I&d knee with poly exchange  03/02/2012    Procedure: IRRIGATION AND DEBRIDEMENT KNEE WITH  POLY EXCHANGE;  Surgeon: Harvie Junior, MD;  Location: [facility] OR;  Service: Orthopedics;  Laterality: Left;  I&D of total knee with Possible Poly Exchange   . Tee without cardioversion  03/06/2012    Procedure: TRANSESOPHAGEAL ECHOCARDIOGRAM (TEE);  Surgeon: Wendall Stade, MD;  Location: [facility] ENDOSCOPY;  Service: Cardiovascular;  Laterality: N/A;  Rm. [id number]  . Port-a-cath removal  03/06/2012    Procedure: REMOVAL PORT-A-CATH;  Surgeon: Adolph Pollack, MD;  Location: [facility] OR;  Service: General;  Laterality: N/A;  . Portacath placement  05/01/2012    Procedure: INSERTION PORT-A-CATH;  Surgeon: Currie Paris, MD;  Location: [facility];  Service: General;  Laterality:  Right;  right internal jugular port-a-cath insertion  . Total knee arthroplasty Left 08/18/2012    Procedure:  Irrigation and debridement of LEFT total knee;  Removal of total knee parts; Implant of spacers;  Surgeon: Nestor Lewandowsky, MD;  Location: [facility] OR;  Service: Orthopedics;  Laterality: Left;  . Port-a-cath removal Right 08/22/2012    Procedure: REMOVAL PORT-A-CATH;  Surgeon: Mariella Saa, MD;  Location: [facility] OR;  Service: General;  Laterality: Right;  . Joint replacement  Feb 2013    left knee- multiple surgeries   . Mastectomy    . Total knee revision Left 04/19/2013    D   Family History  Problem Relation Age of Onset  . Breast cancer Paternal Aunt     dx in her 8s  . Heart disease Maternal Grandfather   . Lung cancer Maternal Uncle     smoker  . Breast cancer Paternal Grandmother     dx in her 22s  . Ovarian cancer Paternal Aunt     dx in her 56s   Social History:  reports that she has never smoked. She has never used smokeless tobacco. She reports that she does not drink alcohol or use illicit drugs. Allergies: No Known Allergies Medications Prior to Admission  Medication Sig Dispense Refill  . ALPRAZolam (XANAX) 0.5 MG tablet Take 1 tablet (0.5 mg total) by mouth 3 (three) times daily as needed for anxiety.  15 tablet  0  . amLODipine (NORVASC) 10 MG tablet Take 10 mg by mouth daily before breakfast.       . aspirin EC 325 MG EC tablet Take 1 tablet (325 mg total) by mouth 2 (two) times daily.  30 tablet  0  . atenolol (TENORMIN) 25 MG tablet Take 12.5 mg by mouth daily before breakfast. For blood pressure      . baclofen (LIORESAL) 10 MG tablet Take 10 mg by mouth 3 (three) times daily.       Marland Kitchen CALCIUM-MAGNESIUM PO Take 1 tablet by mouth daily.      . chlorhexidine (HIBICLENS) 4 % external liquid Apply 60 mLs (4 application total) topically daily as needed. WASH HEAD TO TOE ONCE NIGHTLY FOR 10 DAYS PRIOR TO SURGERY  120 mL  1  . Diclofenac-Misoprostol 75-0.2 MG TBEC Take  75 mg by mouth Twice daily as needed (for inflammation). Take 1 by mouth twice daily as needed      . docusate sodium (COLACE) 50 MG capsule Take 100 mg by mouth at bedtime.      . DULoxetine (CYMBALTA) 60 MG capsule Take 60 mg by mouth daily before breakfast.       . esomeprazole (NEXIUM) 40 MG capsule Take 40 mg by mouth daily before breakfast.      . gabapentin (NEURONTIN) 300 MG  capsule Take 300 mg by mouth 2 (two) times daily.       . heparin lock flush 100 UNIT/ML SOLN injection Inject 2.5 ml (250 units) total into P.I.C.C. Lumen daily or as directed.  30 Syringe  1  . letrozole (FEMARA) 2.5 MG tablet Take 2.5 mg by mouth daily before breakfast.       . losartan-hydrochlorothiazide (HYZAAR) 100-25 MG per tablet Take 1 tablet by mouth daily before breakfast.       . Multiple Vitamins-Minerals (MULTIVITAMINS THER. W/MINERALS) TABS Take 1 tablet by mouth daily.      . mupirocin ointment (BACTROBAN) 2 % Apply 1 application topically 2 (two) times daily.      . OxyCODONE (OXYCONTIN) 20 mg T12A Take 1 tablet (20 mg total) by mouth every 12 (twelve) hours.  30 tablet  0  . oxyCODONE-acetaminophen (PERCOCET/ROXICET) 5-325 MG per tablet Take 1-2 tablets by mouth every 6 (six) hours as needed for pain.  60 tablet  0  . Probiotic Product (PROBIOTIC DAILY PO) Take 1 capsule by mouth daily.       . vitamin E 400 UNIT capsule Take 800 Units by mouth daily.        Home: Home Living Family/patient expects to be discharged to:: Skilled nursing facility Living Arrangements: Spouse/significant other Additional Comments: spouse works during the day  Functional History: Prior Function Comments: pt uses RW Functional Status:  Mobility: Bed Mobility Bed Mobility: Supine to Sit Supine to Sit: 4: Min assist;HOB elevated Sitting - Scoot to Edge of Bed: 3: Mod assist;With rail Transfers Transfers: Sit to Stand;Stand to [employer] Transfers Sit to Stand: 4: Min assist;With upper extremity assist;From  bed;3: Mod assist (minA from chair/3n1 due to bilat arm rails) Stand to Sit: 4: Min assist;To chair/3-in-1;With armrests;With upper extremity assist Stand Pivot Transfers: 4: Min assist (with RW to/from [facility] - pt indep with hygiene) Ambulation/Gait Ambulation/Gait Assistance: 4: Min assist Ambulation Distance (Feet): 10 Feet Assistive device: Rolling walker Ambulation/Gait Assistance Details: limited by nausea/lightheadedness and R LE pain. Pt donned R knee brace Gait Pattern: Step-to pattern;Decreased stance time - right;Decreased stance time - left;Decreased step length - right;Decreased step length - left;Wide base of support Gait velocity: slow Stairs: No    ADL: ADL Eating/Feeding: Performed;Independent Where Assessed - Eating/Feeding: Chair Grooming: Performed;Wash/dry hands;Supervision/safety Where Assessed - Grooming: Unsupported sitting Upper Body Bathing: Simulated;Supervision/safety Where Assessed - Upper Body Bathing: Unsupported sitting Lower Body Bathing: Simulated;Maximal assistance Where Assessed - Lower Body Bathing: Supported sit to stand Upper Body Dressing: Simulated;Supervision/safety Where Assessed - Upper Body Dressing: Unsupported sitting Lower Body Dressing: Performed;Maximal assistance Where Assessed - Lower Body Dressing: Supported sit to Pharmacist, hospital: Performed;Moderate assistance Toilet Transfer Method: Stand pivot Toilet Transfer Equipment: Bedside commode Equipment Used: Gait belt;Rolling walker Transfers/Ambulation Related to ADLs: Mod assist for SPT chair<>3n1. ADL Comments: Pt very painful during session, requiring incr time for transfer due to pain.  Cognition: Cognition Overall Cognitive Status: Within Functional Limits for tasks assessed Orientation Level: Oriented X4 Cognition Arousal/Alertness: Awake/alert Behavior During Therapy: WFL for tasks assessed/performed Overall Cognitive Status: Within Functional Limits for tasks  assessed  Blood pressure 109/49, pulse 69, temperature 98.4 F (36.9 C), temperature source Oral, resp. rate 17, last menstrual period 12/05/2011, SpO2 98.00%. Physical Exam  Vitals reviewed. Constitutional: She is oriented to person, place, and time. She appears well-developed.  HENT:  Head: Normocephalic.  Eyes: EOM are normal.  Neck: Normal range of motion. Neck supple. No thyromegaly present.  Cardiovascular:  Normal rate and regular rhythm.   Respiratory: Effort normal and breath sounds normal. No respiratory distress.  GI: Soft. Bowel sounds are normal. She exhibits no distension.  Musculoskeletal:  Left knee dressed, incision intact. Knee is quite tender.  Neurological: She is alert and oriented to person, place, and time.  Follows full commands. UE's 4-5/5. Limited right shoulder due to pain today. Substantial pain in left knee limits movement there. RLE is 3-4/5 prox to distal. No gross sensory changes on exam. Cognitively appears intact.  Skin:  Knee incision is dressed and appropriately tender  Psychiatric: She has a normal mood and affect. Her behavior is normal. Judgment and thought content normal.    Results for orders placed during the hospital encounter of 04/19/13 (from the past 24 hour(s))  PREPARE RBC (CROSSMATCH)     Status: None   Collection Time    04/21/13  4:30 PM      Result Value Range   Order Confirmation ORDER PROCESSED BY BLOOD BANK    CBC     Status: Abnormal   Collection Time    04/22/13  5:05 AM      Result Value Range   WBC 10.1  4.0 - 10.5 K/uL   RBC 3.16 (*) 3.87 - 5.11 MIL/uL   Hemoglobin 9.2 (*) 12.0 - 15.0 g/dL   HCT 13.0 (*) 86.5 - 78.4 %   MCV 83.5  78.0 - 100.0 fL   MCH 29.1  26.0 - 34.0 pg   MCHC 34.8  30.0 - 36.0 g/dL   RDW 69.6  29.5 - 28.4 %   Platelets 179  150 - 400 K/uL   Dg Chest Port 1 View  04/21/2013   CLINICAL DATA:  Initial encounter after bedside PICC placement. Prior history of breast cancer post left mastectomy.   EXAM: PORTABLE CHEST - 1 VIEW  COMPARISON:  CT chest 12/28/2012. Portable chest x-ray 05/01/2012, 03/09/2012.  FINDINGS: Right arm PICC tip projects over the upper SVC. Cardiac silhouette mildly enlarged but stable. Hilar and mediastinal contours otherwise unremarkable. Lungs clear. Bronchovascular markings normal. Pulmonary vascularity normal. No visible pleural effusions. No pneumothorax. Prior left mastectomy and left axillary node dissection.  IMPRESSION: 1. Right arm PICC tip projects over the upper SVC. 2. Stable mild cardiomegaly. No acute cardiopulmonary disease.   Electronically Signed   By: Hulan Saas M.D.   On: 04/21/2013 08:48    Assessment/Plan: Diagnosis: left total knee revision, hx of MS 1. Does the need for close, 24 hr/day medical supervision in concert with the patient's rehab needs make it unreasonable for this patient to be served in a less intensive setting? Yes 2. Co-Morbidities requiring supervision/potential complications: OA right knee, htn 3. Due to bladder management, bowel management, safety, skin/wound care, disease management, medication administration, pain management and patient education, does the patient require 24 hr/day rehab nursing? Yes 4. Does the patient require coordinated care of a physician, rehab nurse, PT (1-2 hrs/day, 5 days/week) and OT (1-2 hrs/day, 5 days/week) to address physical and functional deficits in the context of the above medical diagnosis(es)? Yes Addressing deficits in the following areas: balance, endurance, locomotion, strength, transferring, bowel/bladder control, bathing, dressing, feeding, grooming and toileting 5. Can the patient actively participate in an intensive therapy program of at least 3 hrs of therapy per day at least 5 days per week? Yes 6. The potential for patient to make measurable gains while on inpatient rehab is excellent 7. Anticipated functional outcomes upon discharge from inpatient rehab are  mod I to supervision  with PT, mod I to min assist with OT, n/a with SLP. 8. Estimated rehab length of stay to reach the above functional goals is: 12-16 days 9. Does the patient have adequate social supports to accommodate these discharge functional goals? Yes 10. Anticipated D/C setting: Home 11. Anticipated post D/C treatments: HH therapy 12. Overall Rehab/Functional Prognosis: excellent  RECOMMENDATIONS: This patient's condition is appropriate for continued rehabilitative care in the following setting: CIR Patient has agreed to participate in recommended program. Yes Note that insurance prior authorization may be required for reimbursement for recommended care.  Comment: Rehab RN to follow up. She is quite motivated and has fared well with [place] before.   Ranelle Oyster, MD, [place] [facility]     04/22/2013

## 2013-04-22 NOTE — Progress Notes (Signed)
Occupational Therapy Treatment Patient Details Name: Julie Ross MRN: [id number] DOB: 11-04-1960 Today's Date: 04/22/2013 Time: 1320-1350 OT Time Calculation (min): 30 min  OT Assessment / Plan / Recommendation  History of present illness Pt s/p L knee revision with bad right knee as well, h/o MS, and left mastectomy (2 months CA free)   OT comments  [name] with above presents to acute OT with making progress although slowly due to Bil knee pain and now right upper arm (deltoid attachment area) pain (heat applied). Will continue to benefit from acute OT with follow up on CIR.  Follow Up Recommendations  CIR       Equipment Recommendations  3 in 1 bedside comode    Recommendations for Other Services Rehab consult  Frequency Min 2X/week   Progress towards OT Goals Progress towards OT goals: Progressing toward goals  Plan Discharge plan remains appropriate    Precautions / Restrictions Precautions Precautions: Knee Precaution Comments: no PROM or CPM to L knee, pt with R knee in poor condition as well Required Braces or Orthoses:  (wears brace on right knee for support due to OA) Restrictions Weight Bearing Restrictions: Yes LLE Weight Bearing: Partial weight bearing LLE Partial Weight Bearing Percentage or Pounds: 50%   Pertinent Vitals/Pain 9/10 Bil knee pain; RN called and in to give meds    ADL  Upper Body Bathing: Set up Where Assessed - Upper Body Bathing: Supported sitting Lower Body Bathing: Moderate assistance Where Assessed - Lower Body Bathing: Supported sit to Pharmacist, hospital: Minimal Dentist Method: Sit to Barista: Bedside commode Toileting - Clothing Manipulation and Hygiene: Moderate assistance (cannot get to back peri-area) Where Assessed - Toileting Clothing Manipulation and Hygiene: Sit to stand from 3-in-1 or toilet Transfers/Ambulation Related to ADLs: min A for all with RW      OT  Goals(current goals can now be found in the care plan section)    Visit Information  Last OT Received On: 04/22/13 Assistance Needed: +1 History of Present Illness: Pt s/p L knee revision with bad right knee as well, h/o MS, and left mastectomy (2 months CA free)          Cognition  Cognition Arousal/Alertness: Awake/alert Behavior During Therapy: WFL for tasks assessed/performed Overall Cognitive Status: Within Functional Limits for tasks assessed    Mobility  Bed Mobility Details for Bed Mobility Assistance: Pt up in recliner upon arrival Transfers Transfers: Sit to Stand;Stand to Sit Sit to Stand: 4: Min assist;With upper extremity assist;With armrests;From chair/3-in-1 Stand to Sit: 4: Min assist;With upper extremity assist;With armrests;To chair/3-in-1          End of Session OT - End of Session Equipment Utilized During Treatment: Gait belt;Rolling walker (right knee brace) Activity Tolerance: Patient tolerated treatment well Patient left: in chair;with call bell/phone within reach;with family/visitor present Nurse Communication: Patient requests pain meds       Evette Georges [phone] 04/22/2013, 2:51 PM

## 2013-04-22 NOTE — Progress Notes (Signed)
Physical Therapy Treatment Patient Details Name: Julie Ross MRN: [id number] DOB: 17-Jul-1960 Today's Date: 04/22/2013 Time: 8469-6295 PT Time Calculation (min): 20 min  PT Assessment / Plan / Recommendation  History of Present Illness Pt s/p L knee revision   PT Comments   Pt's mobility cont's to be limited by pain in bil knees as well as Rt shoulder/UE today.    Follow Up Recommendations  CIR     Does the patient have the potential to tolerate intense rehabilitation     Barriers to Discharge        Equipment Recommendations  None recommended by PT    Recommendations for Other Services    Frequency 7X/week   Progress towards PT Goals Progress towards PT goals: Progressing toward goals (slowly)  Plan Current plan remains appropriate    Precautions / Restrictions Precautions Precautions: Knee Precaution Comments: no PROM or CPM to L knee, pt with R knee in poor condition as well Restrictions LLE Weight Bearing: Partial weight bearing LLE Partial Weight Bearing Percentage or Pounds: 50%   Pertinent Vitals/Pain 9/10 bil knees; also c/o Rt shoulder/UE pain at PICC line site-- states "it hurts to move it".  Repositioned for comfort.  Ice applied.  RN administered pain medication at beginning of session.       Mobility  Bed Mobility Bed Mobility: Supine to Sit;Sitting - Scoot to Edge of Bed Supine to Sit: 4: Min guard;HOB flat;With rails Sitting - Scoot to Edge of Bed: 4: Min guard Details for Bed Mobility Assistance: Incr time  Transfers Transfers: Sit to Stand;Stand to Sit Sit to Stand: 4: Min assist;With upper extremity assist;From bed Stand to Sit: 4: Min assist;With upper extremity assist;With armrests;To chair/3-in-1 Details for Transfer Assistance: cues for hand placement.  (A) to stabilize RW as pt had to place one hand on RW to begin with due to unable to push to standing with bil UE's pushing from bed.   Ambulation/Gait Ambulation/Gait Assistance: 4: Min  guard Ambulation Distance (Feet): 5 Feet Assistive device: Rolling walker Ambulation/Gait Assistance Details: Cues for sequencing & safe RW management.  Limited by pain in bil knees as well as Rt shoulder/UE at PICC line site today.  Decreased tolerance for WBing through bil LE's due to pain.   Gait Pattern: Step-to pattern;Decreased weight shift to right;Decreased weight shift to left;Antalgic;Trunk flexed (decreased floor clearance) Gait velocity: slow      PT Goals (current goals can now be found in the care plan section) Acute Rehab PT Goals PT Goal Formulation: With patient Time For Goal Achievement: 04/27/13 Potential to Achieve Goals: Good  Visit Information  Last PT Received On: 04/22/13 History of Present Illness: Pt s/p L knee revision    Subjective Data      Cognition  Cognition Arousal/Alertness: Awake/alert Behavior During Therapy: WFL for tasks assessed/performed Overall Cognitive Status: Within Functional Limits for tasks assessed    Balance     End of Session PT - End of Session Equipment Utilized During Treatment: Gait belt Activity Tolerance: Patient limited by pain Patient left: in chair;with call bell/phone within reach Nurse Communication: Mobility status   GP     Lara Mulch 04/22/2013, 10:30 AM  Verdell Face, PTA [phone] 04/22/2013

## 2013-04-22 NOTE — Progress Notes (Signed)
Rehab Admissions Coordinator Note:  Patient was screened by Trish Mage for appropriateness for an Inpatient Acute Rehab Consult.  At this time, an inpatient rehab consult is pending completion.  Will follow up after consult is completed.  Trish Mage 04/22/2013, 8:10 AM  I can be reached at [phone].

## 2013-04-22 NOTE — Care Management Utilization Note (Signed)
Utilization review completed. Laconya Clere, RN BSN 

## 2013-04-23 ENCOUNTER — Other Ambulatory Visit: Payer: Self-pay | Admitting: *Deleted

## 2013-04-23 MED ORDER — ESOMEPRAZOLE MAGNESIUM 40 MG PO CPDR
40.0000 mg | DELAYED_RELEASE_CAPSULE | Freq: Every day | ORAL | Status: DC
  Administered 2013-04-23: 40 mg via ORAL
  Filled 2013-04-23 (×2): qty 1

## 2013-04-23 MED ORDER — OXYCODONE HCL ER 20 MG PO T12A: EXTENDED_RELEASE_TABLET | ORAL | Status: DC

## 2013-04-23 NOTE — Progress Notes (Signed)
Patient d/c to SNF, PICC line has been removed, pressure bandage in place, clean, dry, intact.  Report called to SNF.

## 2013-04-23 NOTE — Progress Notes (Signed)
Rehab admissions - I have received a denial from [facility] for acute inpatient rehab admission.  Patient is aware of denial.  Patient wishes to pursue [facility].  I have notified the case manager of the denial from [facility] insurance.  Call me for questions.  #[phone]

## 2013-04-23 NOTE — Discharge Summary (Signed)
Patient ID: Julie Ross MRN: [id number] DOB/AGE: 54-Oct-1962 54 y.o.  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 Hypertension   . Hypothyroidism   . Neuromuscular disorder     MS TX WITH CYMBALTA   . Multiple sclerosis   . H/O colonoscopy   . H/O bone density study 03/2011  . Wears glasses   . Heart burn   . Depression   . Hx of radiation therapy 04/17/12- 06/04/12    left chest wall, axilla, L supraclavicular fossa 4500 cGy, left mastectomy scar/chest wall 5940 cGy  . H/O echocardiogram     last one 02/2013, due to chemotherapy  . Anxiety     uses xanax mainly for sleep  . Sleep apnea     MILD SLEEP APNEA, NO (Needed) MACHINE  6 YRS AGO HPT [facility]   . GERD (gastroesophageal reflux disease)   . Breast cancer 11/2011    lul/ER+PR+, x1 lymph node   . Cancer     squamous cell skin cancer x7  . Arthritis     knees   . Anemia     h/o - ? bld. transfusion - more recent- ?2013    Surgeries: Procedure(s): TOTAL KNEE REVISION AND REMOVAL CEMENT SPACER on 04/19/2013   Consultants:    Discharged Condition: Improved  Hospital Course: Julie Ross is an 52 y.o. female who was admitted 04/19/2013 for operative treatment ofAcquired absence of knee joint following removal of joint prosthesis with presence of antibiotic-impregnated cement spacer. Patient has severe unremitting pain that affects sleep, daily activities, and work/hobbies. After pre-op clearance the patient was taken to the operating room on 04/19/2013 and underwent  Procedure(s): TOTAL KNEE REVISION AND REMOVAL CEMENT SPACER.    Patient was given perioperative antibiotics: Anti-infectives   Start     Dose/Rate Route Frequency Ordered Stop   04/19/13 1800  ceFAZolin  (ANCEF) IVPB 2 g/50 mL premix     2 g 100 mL/hr over 30 Minutes Intravenous 3 times per day 04/19/13 1416 04/20/13 0608   04/19/13 1143  cefUROXime (ZINACEF) injection  Status:  Discontinued       As needed 04/19/13 1143 04/19/13 1241   04/19/13 1013  cefUROXime (ZINACEF) injection  Status:  Discontinued       As needed 04/19/13 1013 04/19/13 1241   04/19/13 0600  ceFAZolin (ANCEF) IVPB 2 g/50 mL premix  Status:  Discontinued     2 g 100 mL/hr over 30 Minutes Intravenous On call to O.R. 04/18/13 1437 04/19/13 1415       Patient was given sequential compression devices, early ambulation, and chemoprophylaxis to prevent DVT.  Patient benefited maximally from hospital stay and there were no complications.    Recent vital signs: Patient Vitals for the past 24 hrs:  BP Temp Temp src Pulse Resp SpO2  04/23/13 0827 140/64 mmHg - - 74 - -  04/23/13 0600 123/83 mmHg 98.1 F (36.7 C) Oral 76 18 98 %  04/22/13 2035 142/66 mmHg 97.6 F (36.4 C) Oral 71 18 97 %  04/22/13 1337 146/71 mmHg 98.6 F (37 C) - 92 16 96 %     Recent laboratory studies:  Recent Labs  04/21/13 0536 04/22/13 0505  WBC 10.4 10.1  HGB 7.9* 9.2*  HCT 23.2* 26.4*  PLT 202 179     Discharge Medications:     Medication List         ALPRAZolam 0.5 MG tablet  Commonly known as:  XANAX  Take 1 tablet (0.5 mg total) by mouth 3 (three) times daily as needed for anxiety.     amLODipine 10 MG tablet  Commonly known as:  NORVASC  Take 10 mg by mouth daily before breakfast.     aspirin 325 MG EC tablet  Take 1 tablet (325 mg total) by mouth 2 (two) times daily.     aspirin EC 325 MG tablet  Take 1 tablet (325 mg total) by mouth 2 (two) times daily.     atenolol 25 MG tablet  Commonly known as:  TENORMIN  Take 12.5 mg by mouth daily before breakfast. For blood pressure     baclofen 10 MG tablet  Commonly known as:  LIORESAL  Take 10 mg by mouth 3 (three) times daily.     CALCIUM-MAGNESIUM PO  Take 1  tablet by mouth daily.     chlorhexidine 4 % external liquid  Commonly known as:  HIBICLENS  Apply 60 mLs (4 application total) topically daily as needed. WASH HEAD TO TOE ONCE NIGHTLY FOR 10 DAYS PRIOR TO SURGERY     Diclofenac-Misoprostol 75-0.2 MG Tbec  Take 75 mg by mouth Twice daily as needed (for inflammation). Take 1 by mouth twice daily as needed     docusate sodium 50 MG capsule  Commonly known as:  COLACE  Take 100 mg by mouth at bedtime.     DULoxetine 60 MG capsule  Commonly known as:  CYMBALTA  Take 60 mg by mouth daily before breakfast.     esomeprazole 40 MG capsule  Commonly known as:  NEXIUM  Take 40 mg by mouth daily before breakfast.     gabapentin 300 MG capsule  Commonly known as:  NEURONTIN  Take 300 mg by mouth 2 (two) times daily.     heparin lock flush 100 UNIT/ML Soln injection  Inject 2.5 ml (250 units) total into P.I.C.C. Lumen daily or as directed.     letrozole 2.5 MG tablet  Commonly known as:  FEMARA  Take 2.5 mg by mouth daily before breakfast.     losartan-hydrochlorothiazide 100-25 MG per tablet  Commonly known as:  HYZAAR  Take 1 tablet by mouth daily before breakfast.     methocarbamol 500 MG tablet  Commonly known as:  ROBAXIN  Take 1 tablet (500 mg total) by mouth 2 (two) times daily with a meal.     multivitamins ther. w/minerals Tabs tablet  Take 1 tablet by mouth daily.     mupirocin ointment 2 %  Commonly known as:  BACTROBAN  Apply 1 application topically 2 (two) times daily.     OxyCODONE 20 mg T12a 12 hr tablet  Commonly known as:  OXYCONTIN  Take 1 tablet (20 mg total) by mouth every 12 (twelve) hours.     oxyCODONE-acetaminophen 5-325 MG per tablet  Commonly known as:  PERCOCET/ROXICET  Take 1-2 tablets by mouth every 6 (six) hours as needed for pain.     oxyCODONE-acetaminophen 5-325 MG per tablet  Commonly known as:  ROXICET  Take 1 tablet by mouth every 4 (four) hours as needed for pain.     PROBIOTIC DAILY  PO  Take 1 capsule by mouth daily.     vitamin E 400 UNIT capsule  Take 800 Units by mouth daily.  Diagnostic Studies: Dg Chest Port 1 View  04/21/2013   CLINICAL DATA:  Initial encounter after bedside PICC placement. Prior history of breast cancer post left mastectomy.  EXAM: PORTABLE CHEST - 1 VIEW  COMPARISON:  CT chest 12/28/2012. Portable chest x-ray 05/01/2012, 03/09/2012.  FINDINGS: Right arm PICC tip projects over the upper SVC. Cardiac silhouette mildly enlarged but stable. Hilar and mediastinal contours otherwise unremarkable. Lungs clear. Bronchovascular markings normal. Pulmonary vascularity normal. No visible pleural effusions. No pneumothorax. Prior left mastectomy and left axillary node dissection.  IMPRESSION: 1. Right arm PICC tip projects over the upper SVC. 2. Stable mild cardiomegaly. No acute cardiopulmonary disease.   Electronically Signed   By: Hulan Saas M.D.   On: 04/21/2013 08:48   Dg Knee Left Port  04/19/2013   CLINICAL DATA:  Post left knee revision.  EXAM: PORTABLE LEFT KNEE - 1-2 VIEW  COMPARISON:  09/01/2012  FINDINGS: Changes of left knee replacement. No hardware or bony complicating feature. Soft tissue and joint space gas. Soft tissue drains in place.  IMPRESSION: Left knee replacement/ revision. No acute or complicating feature.   Electronically Signed   By: Charlett Nose M.D.   On: 04/19/2013 14:43    Disposition: 01-Home or Self Care      Discharge Orders   Future Appointments Provider Department Dept Phone   05/03/2013 10:30 AM [facility] Nurse [facility] [phone]   05/10/2013 11:00 AM [facility] Nurse [facility] [phone]   05/17/2013 10:00 AM [facility] Nurse [place] [facility] [phone]   05/24/2013 10:00 AM [facility] Nurse [place] [facility] [phone]   06/07/2013 11:30 AM Mauri [facility] [phone]   06/07/2013 12:00 PM Victorino December, MD [facility] [phone]   06/07/2013 12:30 PM [facility] [phone]   06/10/2013 3:00 PM Sheliah Hatch, MD [facility]  [place] [phone]   Future Orders Complete By Expires   Call MD / Call 911  As directed    Comments:     If you experience chest pain or shortness of breath, CALL 911 and be transported to the hospital emergency room.  If you develope a fever above 101 F, pus (white drainage) or increased drainage or redness at the wound, or calf pain, call your surgeon's office.   Change dressing  As directed    Comments:     Change dressing on 5, then change the dressing daily with sterile 4 x 4 inch gauze dressing and apply TED hose.  You may clean the incision with alcohol prior to redressing.   Constipation Prevention  As directed    Comments:     Drink plenty of fluids.  Prune juice may be helpful.  You may use a stool softener, such as Colace (over the counter) 100 mg twice a day.  Use MiraLax (over the counter) for constipation as needed.   Diet - low sodium heart healthy  As directed    Discharge instructions  As directed    Comments:     Follow up in 2 weeks with Dr. Turner Daniels in office   Driving restrictions  As directed    Comments:     No driving for 2 weeks   Increase activity slowly as tolerated  As directed    Patient may shower  As directed  Comments:     You may shower without a dressing once there is no drainage.  Do not wash over the wound.  If drainage remains, cover wound with plastic wrap and then shower.      Follow-up Information   Follow up with [facility]. [facility], Occupational Therapy and RN)    Contact information:   [address] [place] [address] [phone]       Follow up with  Nestor Lewandowsky, MD In 2 weeks.   Specialty:  Orthopedic Surgery   Contact information:   [address] [place] [address] [phone]        Signed: Vear Clock Carnesha Maravilla R 04/23/2013, 11:51 AM

## 2013-04-23 NOTE — Progress Notes (Signed)
Patient ID: Julie Ross, female   DOB: February 26, 1961, 53 y.o.   MRN: [id number] PATIENT ID: Julie Ross  MRN: [id number]  DOB/AGE:  January 29, 1961 / 51 y.o.  4 Days Post-Op Procedure(s) (LRB): TOTAL KNEE REVISION AND REMOVAL CEMENT SPACER (Left)    PROGRESS NOTE Subjective: Patient is alert, oriented, no Nausea, no Vomiting, yes passing gas, yes Bowel Movement. Taking PO Well. Denies SOB, Chest or Calf Pain. Using Incentive Spirometer, PAS in place. Ambulate 50% weightbearing with walker,No CPM, no passive range of motion active range of motion only.  This is a complex revision for a prior infection.Patient reports pain as 3 on 0-10 scale  .    Objective: Vital signs in last 24 hours: Filed Vitals:   04/22/13 0825 04/22/13 1337 04/22/13 2035 04/23/13 0600  BP: 135/66 146/71 142/66 123/83  Pulse: 99 92 71 76  Temp:  98.6 F (37 C) 97.6 F (36.4 C) 98.1 F (36.7 C)  TempSrc:   Oral Oral  Resp:  16 18 18   SpO2:  96% 97% 98%      Intake/Output from previous day: I/O last 3 completed shifts: In: 800 [P.O.:600; I.V.:200] Out: 851 [Urine:851]   Intake/Output this shift:     LABORATORY DATA:  Recent Labs  04/21/13 0536 04/22/13 0505  WBC 10.4 10.1  HGB 7.9* 9.2*  HCT 23.2* 26.4*  PLT 202 179    Examination: Neurologically intact ABD soft Neurovascular intact Sensation intact distally Intact pulses distally Dorsiflexion/Plantar flexion intact Incision: no drainage No cellulitis present Compartment soft} There is no effusion in the knee, the skin is soft and supple there's no erythema and no increased warmth. Assessment:   4 Days Post-Op Procedure(s) (LRB): TOTAL KNEE REVISION AND REMOVAL CEMENT SPACER (Left) ADDITIONAL DIAGNOSIS:  multiple sclerosis, history of left knee periprosthetic infection, status post removal of cement spacer and implantation of total knee on this admission  Plan: PT/OT WBAT, CPM 5/hrs day until ROM 0-90 degrees, then D/C CPM DVT Prophylaxis:   SCDx72hrs, ASA 325 mg BID x 2 weeks DISCHARGE PLAN: Inpatient Rehab , Patient has history of multiple sclerosis and would be a good candidate for inpatient rehabilitation if available DISCHARGE NEEDS: HHPT, HHRN, CPM, Walker and 3-in-1 comode seat     Layan Zalenski J 04/23/2013, 7:41 AM

## 2013-04-23 NOTE — Progress Notes (Signed)
Clinical social worker assisted with patient discharge to skilled nursing facility, [facility].  CSW addressed all family questions and concerns. CSW copied chart and added all important documents. CSW also set up patient transportation with [facility]. Clinical Social Worker will sign off for now as social work intervention is no longer needed.   Devaris Quirk, MSW, LCSWA [phone] 

## 2013-04-23 NOTE — Progress Notes (Signed)
Physical Therapy Treatment Patient Details Name: Julie Ross MRN: [id number] DOB: 02-17-1961 Today's Date: 04/23/2013 Time: 1308-6578 PT Time Calculation (min): 24 min  PT Assessment / Plan / Recommendation  History of Present Illness Pt s/p L knee revision with bad right knee as well, h/o MS, and left mastectomy (2 months CA free)   PT Comments   Mobility cont's to be limited by pain.    Follow Up Recommendations  CIR     Does the patient have the potential to tolerate intense rehabilitation     Barriers to Discharge        Equipment Recommendations  None recommended by PT    Recommendations for Other Services    Frequency 7X/week   Progress towards PT Goals Progress towards PT goals: Progressing toward goals  Plan Current plan remains appropriate    Precautions / Restrictions Precautions Precautions: Knee Precaution Comments: no PROM or CPM to L knee, pt with R knee in poor condition as well Restrictions Weight Bearing Restrictions: Yes LLE Weight Bearing: Partial weight bearing LLE Partial Weight Bearing Percentage or Pounds: 50%   Pertinent Vitals/Pain 8/10 bil knees.  Premedicated.  Repositioned for comfort    Mobility  Bed Mobility Bed Mobility: Supine to Sit;Sitting - Scoot to Edge of Bed Supine to Sit: 6: Modified independent (Device/Increase time);With rails;HOB elevated Sitting - Scoot to Edge of Bed: 6: Modified independent (Device/Increase time) Details for Bed Mobility Assistance: Pt began moving to EOB before bed was flattened out Transfers Transfers: Sit to Stand;Stand to Sit;Stand Pivot Transfers Sit to Stand: 4: Min guard;With upper extremity assist;With armrests;From bed;From chair/3-in-1 Stand to Sit: 4: Min guard;With upper extremity assist;With armrests;To chair/3-in-1 Stand Pivot Transfers: 4: Min guard Details for Transfer Assistance: Incr time but no physical (A) needed.   Cont's to really rely on UE's for  activity Ambulation/Gait Ambulation/Gait Assistance: 4: Min guard Ambulation Distance (Feet): 12 Feet Assistive device: Rolling walker Ambulation/Gait Assistance Details: Guarding for safety.  Pt took long pauses in between steps to let UE's relax.   Gait Pattern: Step-to pattern;Decreased step length - right;Decreased step length - left;Decreased weight shift to right;Decreased weight shift to left Gait velocity: slow Stairs: No Wheelchair Mobility Wheelchair Mobility: No    Exercises Total Joint Exercises Ankle Circles/Pumps: AROM;Both;10 reps Quad Sets: AROM;Both;10 reps    PT Goals (current goals can now be found in the care plan section) Acute Rehab PT Goals PT Goal Formulation: With patient Time For Goal Achievement: 04/27/13 Potential to Achieve Goals: Good  Visit Information  Last PT Received On: 04/23/13 Assistance Needed: +1 History of Present Illness: Pt s/p L knee revision with bad right knee as well, h/o MS, and left mastectomy (2 months CA free)    Subjective Data      Cognition  Cognition Arousal/Alertness: Awake/alert Behavior During Therapy: WFL for tasks assessed/performed Overall Cognitive Status: Within Functional Limits for tasks assessed    Balance     End of Session PT - End of Session Activity Tolerance: Patient limited by fatigue;Patient limited by pain Patient left: in chair;with call bell/phone within reach Nurse Communication: Mobility status   GP     Lara Mulch 04/23/2013, 11:17 AM   Verdell Face, PTA [phone] 04/23/2013

## 2013-04-24 ENCOUNTER — Non-Acute Institutional Stay (SKILLED_NURSING_FACILITY): Payer: 59 | Admitting: Internal Medicine

## 2013-04-24 DIAGNOSIS — E039 Hypothyroidism, unspecified: Secondary | ICD-10-CM

## 2013-04-24 DIAGNOSIS — IMO0002 Reserved for concepts with insufficient information to code with codable children: Secondary | ICD-10-CM

## 2013-04-24 DIAGNOSIS — I10 Essential (primary) hypertension: Secondary | ICD-10-CM

## 2013-04-24 DIAGNOSIS — M1712 Unilateral primary osteoarthritis, left knee: Secondary | ICD-10-CM

## 2013-04-24 DIAGNOSIS — M1710 Unilateral primary osteoarthritis, unspecified knee: Secondary | ICD-10-CM

## 2013-04-24 DIAGNOSIS — K219 Gastro-esophageal reflux disease without esophagitis: Secondary | ICD-10-CM

## 2013-04-24 LAB — ANAEROBIC CULTURE

## 2013-04-26 ENCOUNTER — Encounter (HOSPITAL_COMMUNITY): Payer: Self-pay | Admitting: Orthopedic Surgery

## 2013-04-30 ENCOUNTER — Telehealth: Payer: Self-pay | Admitting: *Deleted

## 2013-04-30 NOTE — Telephone Encounter (Signed)
Call from [facility] in scheduling per pt they removed her picc line when she had her knee surgery.  All picc line appts have been removed from pt's schedule. Next f/u 12/19 Message forward to provider for review

## 2013-04-30 NOTE — Telephone Encounter (Signed)
Patient called and canceled her flush appts. Per patient she had her PICC removed after her knee surgery.Desk RN notified.  JMW

## 2013-04-30 NOTE — Telephone Encounter (Signed)
Ok. Thanks!

## 2013-05-01 ENCOUNTER — Other Ambulatory Visit: Payer: Self-pay | Admitting: Adult Health

## 2013-05-13 ENCOUNTER — Telehealth: Payer: Self-pay | Admitting: Oncology

## 2013-05-13 NOTE — Telephone Encounter (Signed)
, °

## 2013-05-18 DIAGNOSIS — T84099A Other mechanical complication of unspecified internal joint prosthesis, initial encounter: Secondary | ICD-10-CM

## 2013-05-18 DIAGNOSIS — Z96659 Presence of unspecified artificial knee joint: Secondary | ICD-10-CM

## 2013-05-26 ENCOUNTER — Other Ambulatory Visit: Payer: Self-pay | Admitting: Adult Health

## 2013-05-29 DIAGNOSIS — E039 Hypothyroidism, unspecified: Secondary | ICD-10-CM | POA: Insufficient documentation

## 2013-05-29 DIAGNOSIS — K219 Gastro-esophageal reflux disease without esophagitis: Secondary | ICD-10-CM | POA: Insufficient documentation

## 2013-05-29 NOTE — Progress Notes (Signed)
Patient ID: Julie Ross, female   DOB: Jan 04, 1961, 52 y.o.   MRN: [id number]        HISTORY & PHYSICAL  DATE: 04/24/2013   FACILITY: [facility]  LEVEL OF CARE: SNF (31)  ALLERGIES:  No Known Allergies  CHIEF COMPLAINT:  Manage left knee pain, hypothyroidism, and hypertension.    HISTORY OF PRESENT ILLNESS:  The patient is a 52 year-old, Caucasian female.    LEFT KNEE PAIN:  The patient has had absence of left knee joint following removal of joint prosthesis with presence of antibiotic-impregnated cement spacer.  She was having severe, unremitting pain that affecting sleep, daily activities, and work.  Therefore,, she underwent left total knee revision and removal of cement spacer.    She tolerated the procedure well and is admitted to this facility for short-term rehabilitation.  She denies knee pain.     HTN: Pt 's HTN remains stable.  Denies CP, sob, DOE, pedal edema, headaches, dizziness or visual disturbances.  No complications from the medications currently being used.  Last BP :  140/64, 123/83, 142/66.    HYPOTHYROIDISM: The hypothyroidism remains stable. No complications noted from the medications presently being used.  The patient denies fatigue or constipation.  Last TSH:  Not available.  PAST MEDICAL HISTORY :  Past Medical History  Diagnosis Date  . Hypertension   . Hypothyroidism   . Neuromuscular disorder     MS TX WITH CYMBALTA   . Multiple sclerosis   . H/O colonoscopy   . H/O bone density study 03/2011  . Wears glasses   . Heart burn   . Depression   . Hx of radiation therapy 04/17/12- 06/04/12    left chest wall, axilla, L supraclavicular fossa 4500 cGy, left mastectomy scar/chest wall 5940 cGy  . H/O echocardiogram     last one 02/2013, due to chemotherapy  . Anxiety     uses xanax mainly for sleep  . Sleep apnea     MILD SLEEP APNEA, NO (Needed) MACHINE  6 YRS AGO [facility]   . GERD (gastroesophageal reflux disease)   . Breast  cancer 11/2011    lul/ER+PR+, x1 lymph node   . Cancer     squamous cell skin cancer x7  . Arthritis     knees   . Anemia     h/o - ? bld. transfusion - more recent- ?2013    PAST SURGICAL HISTORY: Past Surgical History  Procedure Laterality Date  . Tonsillectomy    . Tumor removal      FROM PELVIS AGE 39  . Cesarean section      X2   . No past surgeries      BLADDER SLING  4-5 YRS AGO   . Knee arthroscopy      LT KNEE 04/2011  . Total knee arthroplasty  08/15/2011    Procedure: TOTAL KNEE ARTHROPLASTY; lft Surgeon: Harvie Junior, MD;  Location: [facility] OR;  Service: Orthopedics;  Laterality: Left;  RIGHT KNEE CORTIZONE INJECTION  . Mohs surgery      right face  . Knee arthroscopy  82    rt  . Mastectomy w/ sentinel node biopsy  12/19/2011    Procedure: MASTECTOMY WITH SENTINEL LYMPH NODE BIOPSY;  Surgeon: Currie Paris, MD;  Location: [facility] OR;  Service: General;  Laterality: Left;  left breast and left axilla   . Portacath placement  01/16/2012    Procedure: INSERTION PORT-A-CATH;  Surgeon: Ephriam Knuckles  Leta Jungling, MD;  Location: [facility];  Service: General;  Laterality: Right;  Porta Cath Placement   . I&d knee with poly exchange  03/02/2012    Procedure: IRRIGATION AND DEBRIDEMENT KNEE WITH POLY EXCHANGE;  Surgeon: Harvie Junior, MD;  Location: [facility] OR;  Service: Orthopedics;  Laterality: Left;  I&D of total knee with Possible Poly Exchange   . Tee without cardioversion  03/06/2012    Procedure: TRANSESOPHAGEAL ECHOCARDIOGRAM (TEE);  Surgeon: Wendall Stade, MD;  Location: [facility];  Service: Cardiovascular;  Laterality: N/A;  Rm. [id number]  . Port-a-cath removal  03/06/2012    Procedure: REMOVAL PORT-A-CATH;  Surgeon: Adolph Pollack, MD;  Location: [facility] OR;  Service: General;  Laterality: N/A;  . Portacath placement  05/01/2012    Procedure: INSERTION PORT-A-CATH;  Surgeon: Currie Paris, MD;  Location: [facility];  Service: General;  Laterality: Right;   right internal jugular port-a-cath insertion  . Total knee arthroplasty Left 08/18/2012    Procedure:  Irrigation and debridement of LEFT total knee;  Removal of total knee parts; Implant of spacers;  Surgeon: Nestor Lewandowsky, MD;  Location: [facility] OR;  Service: Orthopedics;  Laterality: Left;  . Port-a-cath removal Right 08/22/2012    Procedure: REMOVAL PORT-A-CATH;  Surgeon: Mariella Saa, MD;  Location: [facility] OR;  Service: General;  Laterality: Right;  . Joint replacement  Feb 2013    left knee- multiple surgeries   . Mastectomy    . Total knee revision Left 04/19/2013    D  . Total knee revision Left 04/19/2013    Procedure: TOTAL KNEE REVISION AND REMOVAL CEMENT SPACER;  Surgeon: Nestor Lewandowsky, MD;  Location: [facility] OR;  Service: Orthopedics;  Laterality: Left;    SOCIAL HISTORY:  reports that she has never smoked. She has never used smokeless tobacco. She reports that she does not drink alcohol or use illicit drugs.  FAMILY HISTORY:  Family History  Problem Relation Age of Onset  . Breast cancer Paternal Aunt     dx in her 74s  . Heart disease Maternal Grandfather   . Lung cancer Maternal Uncle     smoker  . Breast cancer Paternal Grandmother     dx in her 44s  . Ovarian cancer Paternal Aunt     dx in her 30s    CURRENT MEDICATIONS: Reviewed per [facility]  REVIEW OF SYSTEMS:  See HPI otherwise 14 point ROS is negative.  PHYSICAL EXAMINATION  VS:  T 98.1       P 76      RR 18      BP 123/83      POX 98% room air        WT (Lb)  GENERAL: no acute distress, moderately obese body habitus EYES: conjunctivae normal, sclerae normal, normal eye lids MOUTH/THROAT: lips without lesions,no lesions in the mouth,tongue is without lesions,uvula elevates in midline NECK: supple, trachea midline, no neck masses, no thyroid tenderness, no thyromegaly LYMPHATICS: no LAN in the neck, no supraclavicular LAN RESPIRATORY: breathing is even & unlabored, BS CTAB CARDIAC: RRR, no murmur,no extra heart  sounds EDEMA/VARICOSITIES: left lower extremity has +2 edema   ARTERIAL: pedal pulses +1   GI:  ABDOMEN: abdomen soft, normal BS, no masses, no tenderness  LIVER/SPLEEN: no hepatomegaly, no splenomegaly MUSCULOSKELETAL: HEAD: normal to inspection & palpation BACK: no kyphosis, scoliosis or spinal processes tenderness EXTREMITIES: LEFT UPPER EXTREMITY: full range of motion, normal strength & tone RIGHT  UPPER EXTREMITY:  full range of motion, normal strength & tone LEFT LOWER EXTREMITY: strength intact, range of motion not tested due to surgery   RIGHT LOWER EXTREMITY: strength intact, range of motion minimal   PSYCHIATRIC: the patient is alert & oriented to person, affect & behavior appropriate  LABS/RADIOLOGY: Chest x-ray:  No acute disease.    Left knee x-ray postoperatively:  Showed left knee revision.  No acute complicating features.    Labs reviewed: Basic Metabolic Panel:  Recent Labs  40/98/11 1038 11/30/12 1029  02/22/13 1048 03/15/13 1035 04/12/13 1412  NA 138 137  < > 138 138 135  K 3.9 4.1  < > 3.8 3.8 4.2  CL 102 100  --   --   --  97  CO2 28 27  < > 28 27 25   GLUCOSE 91 91  < > 104 116 109*  BUN 19.2 26.9*  < > 24.0 21.4 24*  CREATININE 1.2* 1.6*  < > 1.3* 1.3* 1.22*  CALCIUM 9.2 9.4  < > 9.5 9.8 9.4  < > = values in this interval not displayed. Liver Function Tests:  Recent Labs  02/01/13 1001 02/22/13 1048 03/15/13 1035  AST 32 18 19  ALT 39 21 18  ALKPHOS 185* 160* 140  BILITOT 0.63 0.53 0.50  PROT 7.1 7.0 7.3  ALBUMIN 3.7 3.7 3.8   CBC:  Recent Labs  02/22/13 1048 03/15/13 1034 04/12/13 1412 04/20/13 0523 04/21/13 0536 04/22/13 0505  WBC 7.1 6.0 8.1 7.7 10.4 10.1  NEUTROABS 4.5 3.5 5.7  --   --   --   HGB 11.8 12.1 11.8* 7.8* 7.9* 9.2*  HCT 34.8 35.7 35.4* 23.1* 23.2* 26.4*  MCV 84.1 83.6 84.3 86.5 86.2 83.5  PLT 224 225 224 176 202 179   ASSESSMENT/PLAN:  Left knee pain.  Status post revision.  Continue rehabilitation.     Hypertension.  Stable.    Hypothyroidism.  Continue levothyroxine.    GERD.  Well controlled.     History of breast cancer.  Continue Femara.    Constipation.  Well controlled.    Check CBC and BMP.     I have reviewed patient's medical records received at admission/from hospitalization.  CPT CODE: 91478

## 2013-06-07 ENCOUNTER — Ambulatory Visit: Payer: 59 | Admitting: Oncology

## 2013-06-07 ENCOUNTER — Telehealth: Payer: Self-pay

## 2013-06-07 ENCOUNTER — Other Ambulatory Visit: Payer: 59 | Admitting: Lab

## 2013-06-07 NOTE — Telephone Encounter (Signed)
Medication and allergies:  Reviewed and updated  90 day supply/mail order: na Local pharmacy: [facility] [address]   Immunizations due:  Unsure on Tdap  A/P:   No changes to FH or PSH or personal hx (updated) Pap--[facility]--11/2012--nml Bone Density--none noted [facility]--7 years ago--nml per patient  To Discuss with Provider: Not at this time

## 2013-06-10 ENCOUNTER — Encounter: Payer: Self-pay | Admitting: General Practice

## 2013-06-10 ENCOUNTER — Other Ambulatory Visit: Payer: Self-pay | Admitting: General Practice

## 2013-06-10 ENCOUNTER — Encounter: Payer: Self-pay | Admitting: Family Medicine

## 2013-06-10 ENCOUNTER — Ambulatory Visit (INDEPENDENT_AMBULATORY_CARE_PROVIDER_SITE_OTHER): Payer: 59 | Admitting: Family Medicine

## 2013-06-10 VITALS — BP 136/80 | HR 61 | Temp 98.4°F | Resp 16 | Ht 65.5 in | Wt 212.5 lb

## 2013-06-10 DIAGNOSIS — C50412 Malignant neoplasm of upper-outer quadrant of left female breast: Secondary | ICD-10-CM

## 2013-06-10 DIAGNOSIS — F329 Major depressive disorder, single episode, unspecified: Secondary | ICD-10-CM

## 2013-06-10 DIAGNOSIS — M170 Bilateral primary osteoarthritis of knee: Secondary | ICD-10-CM

## 2013-06-10 DIAGNOSIS — G35D Multiple sclerosis, unspecified: Secondary | ICD-10-CM

## 2013-06-10 DIAGNOSIS — I10 Essential (primary) hypertension: Secondary | ICD-10-CM

## 2013-06-10 DIAGNOSIS — F32A Depression, unspecified: Secondary | ICD-10-CM

## 2013-06-10 DIAGNOSIS — M1710 Unilateral primary osteoarthritis, unspecified knee: Secondary | ICD-10-CM

## 2013-06-10 DIAGNOSIS — IMO0002 Reserved for concepts with insufficient information to code with codable children: Secondary | ICD-10-CM

## 2013-06-10 DIAGNOSIS — C50419 Malignant neoplasm of upper-outer quadrant of unspecified female breast: Secondary | ICD-10-CM

## 2013-06-10 DIAGNOSIS — G35 Multiple sclerosis: Secondary | ICD-10-CM

## 2013-06-10 MED ORDER — OXYCODONE-ACETAMINOPHEN 5-325 MG PO TABS: 1.0000 | ORAL_TABLET | ORAL | Status: DC | PRN

## 2013-06-10 MED ORDER — DULOXETINE HCL 60 MG PO CPEP: 60.0000 mg | ORAL_CAPSULE | Freq: Every day | ORAL | Status: DC

## 2013-06-10 MED ORDER — METHOCARBAMOL 500 MG PO TABS: 500.0000 mg | ORAL_TABLET | Freq: Two times a day (BID) | ORAL | Status: DC

## 2013-06-10 MED ORDER — DULOXETINE HCL 30 MG PO CPEP: 30.0000 mg | ORAL_CAPSULE | Freq: Every day | ORAL | Status: DC

## 2013-06-10 MED ORDER — DULOXETINE HCL 30 MG PO CPEP: ORAL_CAPSULE | ORAL | Status: DC

## 2013-06-10 NOTE — Assessment & Plan Note (Signed)
New to provider, chronic for pt.  Adequate control.  Asymptomatic.  No med changes.

## 2013-06-10 NOTE — Assessment & Plan Note (Signed)
New to provider, ongoing for pt.  Following regularly w/ onc.  On Femara which is causing weight gain- worsening pt's depression.  Will follow.

## 2013-06-10 NOTE — Assessment & Plan Note (Signed)
New.  Moderate to severe.  Pt admits to suicidal thoughts previously.  Was seeing therapist at the cancer center but has stopped.  Pt is overwhelmed w/ pain, multiple procedures, cancer, infections.  Increase Cymbalta to 90mg  daily and monitor for improvement.

## 2013-06-10 NOTE — Patient Instructions (Signed)
Follow up in 1 month to recheck mood Increase Cymbalta to 90 mg daily- finish up your 60mg  tabs by adding 1 30 mg.  When done w/ the 60mg  tabs, take 3 of the 30mg  tabs Use the pain meds as needed Call with any questions or concerns Welcome!  We're glad to have you! Hang in there! Happy Holidays!

## 2013-06-10 NOTE — Assessment & Plan Note (Signed)
Following w/ neuro- not currently on meds due to hx of side effects.

## 2013-06-10 NOTE — Progress Notes (Signed)
   Subjective:    Patient ID: Julie Ross, female    DOB: Dec 15, 1960, 52 y.o.   MRN: [id number]  HPI New to establish.  Previous PCP- [facility])  Onc- Welton Flakes, Ortho- Sherie Don, GYN- Julio Sicks, Neuro- [facility].  HTN- chronic problem, on Norvasc, Atenolol, Hyzaar.  No CP, SOB, HAs, visual changes, edema.  Breast Cancer- L breast, s/p mastectomy, chemo and Radiation.  Following w/ Dr Welton Flakes.  MS- chronic problem, following w/ [facility].  Not currently on any meds due to hx of side effects.  Pt hesitant to take new drug due to immunosuppression and hx of joint infxn.  Infected prosthetic knee joint- L knee, s/p 3 revisions.  Initial thought was that chemo was causing infxns.  Was following w/ ID.  Not currently on abx.    Depression- medically induced due to multiple issues w/ knee replacement and breast cancer.  On Cymbalta.  Pt admits to feeling sad, depressed, overwhelmed.  Has had suicidal thoughts previously.   Review of Systems For ROS see HPI     Objective:   Physical Exam  Vitals reviewed. Constitutional: She is oriented to person, place, and time. She appears well-developed and well-nourished. No distress.  Walking w/ walker  HENT:  Head: Normocephalic and atraumatic.  Eyes: Conjunctivae and EOM are normal. Pupils are equal, round, and reactive to light.  Neck: Normal range of motion. Neck supple. No thyromegaly present.  Cardiovascular: Normal rate, regular rhythm, normal heart sounds and intact distal pulses.   No murmur heard. Pulmonary/Chest: Effort normal and breath sounds normal. No respiratory distress.  Abdominal: Soft. She exhibits no distension. There is no tenderness.  Musculoskeletal: She exhibits no edema.  Lymphadenopathy:    She has no cervical adenopathy.  Neurological: She is alert and oriented to person, place, and time.  Skin: Skin is warm and dry.  Psychiatric: She has a normal mood and affect. Her behavior is normal.            Assessment & Plan:

## 2013-06-10 NOTE — Assessment & Plan Note (Signed)
New to provider, chronic for pt.  S/p surgery x4 on L.  Due for surgery on R but unable to have at this time.  Maintained currently on pain meds.  Refill provided today.

## 2013-06-25 ENCOUNTER — Ambulatory Visit (HOSPITAL_BASED_OUTPATIENT_CLINIC_OR_DEPARTMENT_OTHER): Payer: 59 | Admitting: Oncology

## 2013-06-25 ENCOUNTER — Other Ambulatory Visit (HOSPITAL_BASED_OUTPATIENT_CLINIC_OR_DEPARTMENT_OTHER): Payer: 59

## 2013-06-25 ENCOUNTER — Telehealth: Payer: Self-pay | Admitting: *Deleted

## 2013-06-25 ENCOUNTER — Encounter: Payer: Self-pay | Admitting: Oncology

## 2013-06-25 VITALS — BP 117/47 | HR 63 | Temp 98.0°F | Resp 18 | Ht 65.0 in | Wt 211.2 lb

## 2013-06-25 DIAGNOSIS — C50419 Malignant neoplasm of upper-outer quadrant of unspecified female breast: Secondary | ICD-10-CM

## 2013-06-25 DIAGNOSIS — C50912 Malignant neoplasm of unspecified site of left female breast: Secondary | ICD-10-CM

## 2013-06-25 DIAGNOSIS — M1712 Unilateral primary osteoarthritis, left knee: Secondary | ICD-10-CM

## 2013-06-25 DIAGNOSIS — C50412 Malignant neoplasm of upper-outer quadrant of left female breast: Secondary | ICD-10-CM

## 2013-06-25 DIAGNOSIS — Z170 Estrogen receptor positive status [ER+]: Secondary | ICD-10-CM

## 2013-06-25 LAB — COMPREHENSIVE METABOLIC PANEL (CC13)
ALT: 19 U/L (ref 0–55)
AST: 17 U/L (ref 5–34)
Albumin: 3.9 g/dL (ref 3.5–5.0)
Alkaline Phosphatase: 160 U/L — ABNORMAL HIGH (ref 40–150)
Anion Gap: 12 mEq/L — ABNORMAL HIGH (ref 3–11)
BUN: 22.9 mg/dL (ref 7.0–26.0)
CO2: 24 mEq/L (ref 22–29)
Calcium: 9.6 mg/dL (ref 8.4–10.4)
Chloride: 103 mEq/L (ref 98–109)
Creatinine: 1.3 mg/dL — ABNORMAL HIGH (ref 0.6–1.1)
Glucose: 154 mg/dl — ABNORMAL HIGH (ref 70–140)
Potassium: 3.4 mEq/L — ABNORMAL LOW (ref 3.5–5.1)
Sodium: 140 mEq/L (ref 136–145)
Total Bilirubin: 0.52 mg/dL (ref 0.20–1.20)
Total Protein: 7.2 g/dL (ref 6.4–8.3)

## 2013-06-25 LAB — CBC WITH DIFFERENTIAL/PLATELET
BASO%: 1.1 % (ref 0.0–2.0)
Basophils Absolute: 0.1 10*3/uL (ref 0.0–0.1)
EOS%: 4.5 % (ref 0.0–7.0)
Eosinophils Absolute: 0.3 10*3/uL (ref 0.0–0.5)
HCT: 35.8 % (ref 34.8–46.6)
HGB: 12.3 g/dL (ref 11.6–15.9)
LYMPH%: 30.3 % (ref 14.0–49.7)
MCH: 29 pg (ref 25.1–34.0)
MCHC: 34.3 g/dL (ref 31.5–36.0)
MCV: 84.6 fL (ref 79.5–101.0)
MONO#: 0.4 10*3/uL (ref 0.1–0.9)
MONO%: 6 % (ref 0.0–14.0)
NEUT#: 3.8 10*3/uL (ref 1.5–6.5)
NEUT%: 58.1 % (ref 38.4–76.8)
Platelets: 230 10*3/uL (ref 145–400)
RBC: 4.23 10*6/uL (ref 3.70–5.45)
RDW: 13.4 % (ref 11.2–14.5)
WBC: 6.5 10*3/uL (ref 3.9–10.3)
lymph#: 2 10*3/uL (ref 0.9–3.3)

## 2013-06-25 NOTE — Telephone Encounter (Signed)
appts made and printed...td 

## 2013-06-25 NOTE — Progress Notes (Signed)
OFFICE PROGRESS NOTE  CC  Julie Asa, MD [phone] [address] [place] [address] Dr. Neldon Mc Dr. Arvella Nigh Dr. Sable Feil Dr. Weldon Picking ([facility])  DIAGNOSIS: 53 year old female who originally was seen in the multidisciplinary breast clinic for discussion of new diagnosis of left breast cancer. She is now status post left mastectomy with the final pathology revealing an invasive ductal carcinoma that is ER positive PR positive HER-2/neu positive with Ki-67 11%.  PRIOR THERAPY:  #1 patient was originally seen in the multidisciplinary breast clinic on 11/30/2011 for what at that time looked like a ductal carcinoma in situ of the left breast. Due to the extent of disease she was recommended a mastectomy with axillary lymph node sampling.  #2 12/19/2011 patient had a left mastectomy with sentinel lymph node biopsy. Her final pathology revealed 2 foci of invasive ductal carcinoma measuring 0.8 and 0.6 cm. The tumor was low grade ER positive PR positive. Patient's tumor was found to be HER-2/neu positive with HER-2 ratio of 3.27. Ki-67 was 11%. Patient had sentinel lymph node  Biopsies performed one of 4 lymph nodes were positive for metastatic disease. Patient's final pathologic staging is T1 N1 M0. Stage II  #3 patient has received adjuvant chemotherapy consisting of Taxotere carboplatinum and Herceptin for 2 cycles starting in 01/2012. This was then complicated by development of bacteremia left knee sepsis requiring revision of the knee. Port-A-Cath site infection requiring removal of the Port-A-Cath and placement of a PICC line.  #4 patient then received Herceptin every 3 weeks with radiation therapy  From 10/ 29 through 06/04/12 while she finished up her antibiotics.  #5 patient completed Taxotere carboplatinum and Herceptin combination to finish out her last 2 cycles. We discussed the risks and benefits of this again. This was started on 08/03/12,  and she finished two cycles.  The second cycle was delayed due to another incidence of left knee septic arthritis.  Her hardware was removed, she was placed on IV antibiotics, and was cleared by infectious disease to finish her last cycle of [place]  on 09/21/12.    #7 status post adjuvant Herceptin on 10/19/12 - 04/14/13  #8 patient began curative intent Letrozole 2.5 mg daily starting in September 2014  CURRENT THERAPY: Letrozole 2.5 mg daily starting 03/15/2013  INTERVAL HISTORY: Julie Ross 53 y.o. female returns for Followup visit. Clinically patient seems to be doing well. She has had her left knee replaced. She tolerated the procedure well. She has gone through physical therapy. She has been on letrozole now for about 3 months. So far she tells me that she's been tolerating it well without any significant problems. She denies any headaches double vision blurring of vision fevers chills night sweats or shortness of breath no peripheral paresthesias. Patient is quite concerned about her weight gain do to sedentary lifestyle. She is not able to do much do to her knees. She is now beginning to have problems with her right knee. There is a question of whether she will have replacement of this as well. I have encouraged her to proceed with right knee replacements as well so that she can become more mobile. This will certainly allow her to exercise more and get to a good weight. Patient has no nausea no double vision she has not noticed any lumps or bumps in her chest wall. Remainder of the 10 point review of systems is negative.  MEDICAL HISTORY: Past Medical History  Diagnosis Date  . Hypertension   .  Hypothyroidism   . Neuromuscular disorder     MS TX WITH CYMBALTA   . Multiple sclerosis   . H/O colonoscopy   . H/O bone density study 03/2011  . Wears glasses   . Heart burn   . Depression   . Hx of radiation therapy 04/17/12- 06/04/12    left chest wall, axilla, L supraclavicular fossa 4500 cGy,  left mastectomy scar/chest wall 5940 cGy  . H/O echocardiogram     last one 02/2013, due to chemotherapy  . Anxiety     uses xanax mainly for sleep  . Sleep apnea     MILD SLEEP APNEA, NO (Needed) MACHINE  6 YRS AGO HPT [facility]   . GERD (gastroesophageal reflux disease)   . Breast cancer 11/2011    lul/ER+PR+, x1 lymph node   . Cancer     squamous cell skin cancer x7  . Arthritis     knees   . Anemia     h/o - ? bld. transfusion - more recent- ?2013    ALLERGIES:  has No Known Allergies.  MEDICATIONS:  Current Outpatient Prescriptions  Medication Sig Dispense Refill  . ALPRAZolam (XANAX) 0.5 MG tablet Take 1 tablet (0.5 mg total) by mouth 3 (three) times daily as needed for anxiety.  15 tablet  0  . amLODipine (NORVASC) 10 MG tablet Take 10 mg by mouth daily before breakfast.       . aspirin EC 325 MG EC tablet Take 1 tablet (325 mg total) by mouth 2 (two) times daily.  30 tablet  0  . atenolol (TENORMIN) 25 MG tablet Take 12.5 mg by mouth daily before breakfast. For blood pressure      . baclofen (LIORESAL) 10 MG tablet Take 10 mg by mouth 3 (three) times daily.       Marland Kitchen CALCIUM-MAGNESIUM PO Take 1 tablet by mouth daily.      . Diclofenac-Misoprostol 75-0.2 MG TBEC Take 75 mg by mouth Twice daily as needed (for inflammation). Take 1 by mouth twice daily as needed      . docusate sodium (COLACE) 50 MG capsule Take 100 mg by mouth at bedtime.      . DULoxetine (CYMBALTA) 30 MG capsule Take 1 capsule (30 mg total) by mouth daily.  30 capsule  3  . DULoxetine (CYMBALTA) 60 MG capsule Take 1 capsule (60 mg total) by mouth daily.  30 capsule  3  . esomeprazole (NEXIUM) 40 MG capsule Take 40 mg by mouth daily before breakfast.      . gabapentin (NEURONTIN) 300 MG capsule Take 300 mg by mouth 2 (two) times daily.       Marland Kitchen letrozole (FEMARA) 2.5 MG tablet TAKE 1 TABLET BY MOUTH EVERY DAY  30 tablet  0  . losartan-hydrochlorothiazide (HYZAAR) 100-25 MG per tablet Take 1 tablet by mouth daily  before breakfast.       . methocarbamol (ROBAXIN) 500 MG tablet Take 1 tablet (500 mg total) by mouth 2 (two) times daily with a meal.  60 tablet  3  . Multiple Vitamins-Minerals (MULTIVITAMINS THER. W/MINERALS) TABS Take 1 tablet by mouth daily.      Marland Kitchen oxyCODONE-acetaminophen (ROXICET) 5-325 MG per tablet Take 1 tablet by mouth every 4 (four) hours as needed.  60 tablet  0  . Probiotic Product (PROBIOTIC DAILY PO) Take 1 capsule by mouth daily.       . vitamin E 400 UNIT capsule Take 800 Units by mouth daily.  No current facility-administered medications for this visit.   Facility-Administered Medications Ordered in Other Visits  Medication Dose Route Frequency Provider Last Rate Last Dose  . ceFAZolin (ANCEF) IVPB 2 g/50 mL premix    PRN Tawni Millers, CRNA   2 g at 04/19/13 0956  . dexamethasone (DECADRON) injection    PRN Tawni Millers, CRNA   8 mg at 05/01/12 0908  . fentaNYL (SUBLIMAZE) injection    PRN Tawni Millers, CRNA   100 mcg at 05/01/12 0856  . lidocaine (cardiac) 100 mg/21m (XYLOCAINE) 20 MG/ML injection 2%    PRN WTawni Millers CRNA   60 mg at 05/01/12 0856  . midazolam (VERSED) 5 MG/5ML injection    PRN WTawni Millers CRNA   2 mg at 05/01/12 0856  . propofol (DIPRIVAN) 10 mg/mL bolus/IV push    PRN WTawni Millers CRNA   160 mg at 05/01/12 0900    SURGICAL HISTORY:  Past Surgical History  Procedure Laterality Date  . Tonsillectomy    . Tumor removal      FROM PELVIS AGE 69  . Cesarean section      X2   . No past surgeries      BLADDER SLING  4-5 YRS AGO   . Knee arthroscopy      LT KNEE 04/2011  . Total knee arthroplasty  08/15/2011    Procedure: TOTAL KNEE ARTHROPLASTY; lft Surgeon: JAlta Corning MD;  [facility] [facility]  Service: Orthopedics;  Laterality: Left;  RIGHT KNEE CORTIZONE INJECTION  . Mohs surgery      right face  . Knee arthroscopy  82    rt  . Mastectomy w/ sentinel node biopsy  12/19/2011    Procedure: MASTECTOMY WITH SENTINEL LYMPH NODE  BIOPSY;  Surgeon: CHaywood Lasso MD;  [place] [facility]  Service: General;  Laterality: Left;  left breast and left axilla   . Portacath placement  01/16/2012    Procedure: INSERTION PORT-A-CATH;  Surgeon: CHaywood Lasso MD;  [facility] [facility]  Service: General;  Laterality: Right;  [place] Placement   . I&d knee with poly exchange  03/02/2012    Procedure: IRRIGATION AND DEBRIDEMENT KNEE WITH POLY EXCHANGE;  Surgeon: JAlta Corning MD;  [place] [facility]  Service: Orthopedics;  Laterality: Left;  I&D of total knee with Possible Poly Exchange   . Tee without cardioversion  03/06/2012    Procedure: TRANSESOPHAGEAL ECHOCARDIOGRAM (TEE);  Surgeon: PJosue Hector MD;  [place] [facility]  Service: Cardiovascular;  Laterality: N/A;  Rm. [id number]  . Port-a-cath removal  03/06/2012    Procedure: REMOVAL PORT-A-CATH;  Surgeon: TOdis Hollingshead MD;  [place] [facility]  Service: General;  Laterality: N/A;  . Portacath placement  05/01/2012    Procedure: INSERTION PORT-A-CATH;  Surgeon: CHaywood Lasso MD;  [facility] [facility]  Service: General;  Laterality: Right;  right internal jugular port-a-cath insertion  . Total knee arthroplasty Left 08/18/2012    Procedure:  Irrigation and debridement of LEFT total knee;  Removal of total knee parts; Implant of spacers;  Surgeon: FKerin Salen MD;  [place] [facility]  Service: Orthopedics;  Laterality: Left;  . Port-a-cath removal Right 08/22/2012    Procedure: REMOVAL PORT-A-CATH;  Surgeon: BEdward Jolly MD;  [place] [facility]  Service: General;  Laterality: Right;  . Joint replacement  Feb 2013    left knee- multiple surgeries   . Mastectomy    . Total knee revision Left  04/19/2013    D  . Total knee revision Left 04/19/2013    Procedure: TOTAL KNEE REVISION AND REMOVAL CEMENT SPACER;  Surgeon: Kerin Salen, MD;  Location: [place];  Service: Orthopedics;  Laterality: Left;    REVIEW OF SYSTEMS:   General:  fatigue (+), night sweats (-), fever (-), pain (+) Lymph: palpable nodes (-) HEENT: vision changes (-), mucositis (-), gum bleeding (-), epistaxis (-) Cardiovascular: chest pain (-), palpitations (-) Pulmonary: shortness of breath (-), dyspnea on exertion (-), cough (-), hemoptysis (-) GI:  Early satiety (-), melena (-), dysphagia (-), nausea/vomiting (-), diarrhea (-) GU: dysuria (-), hematuria (-), incontinence (-) Musculoskeletal: joint swelling (-), joint pain (-), back pain (-) Neuro: weakness (-), numbness (-), headache (-), confusion (-) Skin: Rash (-), lesions (-), dryness (-) Psych: depression (+), suicidal/homicidal ideation (+), feeling of hopelessness (-)   PHYSICAL EXAMINATION:  BP 117/47  Pulse 63  Temp(Src) 98 F (36.7 C) (Oral)  Resp 18  Ht '5\' 5"'  (1.651 m)  Wt 211 lb 3.2 oz (95.8 kg)  BMI 35.15 kg/m2  LMP 12/05/2011 General: Patient is a well appearing female in no acute distress HEENT: PERRLA, sclerae anicteric no conjunctival pallor, MMM Neck: supple, no palpable adenopathy Lungs: clear to auscultation bilaterally, no wheezes, rhonchi, or rales Cardiovascular: regular rate rhythm, S1, S2, no murmurs, rubs or gallops Abdomen: Soft, non-tender, non-distended, normoactive bowel sounds, no HSM Extremities: warm and well perfused, no clubbing, cyanosis, or edema Skin: No rashes or lesions Neuro: Non-focal Breasts: deferred, exam limited due to inability to get on exam table due to knees.  ECOG PERFORMANCE STATUS: 1 - Symptomatic but completely ambulatory  LABORATORY DATA: Lab Results  Component Value Date   WBC 6.5 06/25/2013   HGB 12.3 06/25/2013   HCT 35.8 06/25/2013   MCV 84.6 06/25/2013   PLT 230 06/25/2013      Chemistry      Component Value Date/Time   NA 140 06/25/2013 0958   NA 135 04/12/2013 1412   K 3.4* 06/25/2013 0958   K 4.2 04/12/2013 1412   CL 97 04/12/2013 1412   CL 100 11/30/2012 1029   CO2 24 06/25/2013 0958   CO2 25 04/12/2013 1412   BUN 22.9  06/25/2013 0958   BUN 24* 04/12/2013 1412   CREATININE 1.3* 06/25/2013 0958   CREATININE 1.22* 04/12/2013 1412      Component Value Date/Time   CALCIUM 9.6 06/25/2013 0958   CALCIUM 9.4 04/12/2013 1412   ALKPHOS 160* 06/25/2013 0958   ALKPHOS 400* 09/01/2012 1420   AST 17 06/25/2013 0958   AST 28 09/01/2012 1420   ALT 19 06/25/2013 0958   ALT <5 09/01/2012 1420   BILITOT 0.52 06/25/2013 0958   BILITOT 0.4 09/01/2012 1420     DOB: [birth year] Age: 89 Gender: F Client Name [facility] Collected Date: 12/19/2011 Received Date: 12/19/2011 Physician: Neldon Mc Chart #: MRN # : [id number] Physician cc: Arvella Nigh, MD Abelardo Diesel, MD Race:W Visit #: [id number] Lewayne Bunting, RN REPORT OF SURGICAL PATHOLOGY ADDITIONAL INFORMATION: 5. PROGNOSTIC INDICATORS - ACIS Results IMMUNOHISTOCHEMICAL AND MORPHOMETRIC ANALYSIS BY THE AUTOMATED CELLULAR IMAGING SYSTEM (ACIS) Estrogen Receptor (Negative, <1%): 100%, STRONG STAINING INTENSITY Progesterone Receptor (Negative, <1%): 100%, STRONG STAINING INTENSITY Proliferation Marker Ki67 by M IB-1 (Low<20%): 11% All controls stained appropriately Enid Cutter MD Pathologist, Electronic Signature ( Signed 12/29/2011) 5. CHROMOGENIC IN-SITU HYBRIDIZATION Interpretation: HER2/NEU BY CISH - SHOWS AMPLIFICATION BY CISH ANALYSIS. THE RATIO OF HER2: CEP  17 SIGNALS WAS 3.27 Reference range: Ratio: HER2:CEP17 < 1.8 gene amplification not observed Ratio: HER2:CEP 17 1.8-2.2 - equivocal result Ratio: HER2:CEP17 > 2.2 - gene amplification observed Enid Cutter MD Pathologist, Electronic Signature ( Signed 12/27/2011) 1 of 4 FINAL for Carollo, Julie M ([id number]) FINAL DIAGNOSIS Diagnosis 1. Lymph node, sentinel, biopsy, Left axillary - ONE BENIGN LYMPH NODE WITH NO TUMOR SEEN (0/1). 2. Lymph node, sentinel, biopsy, Left axilla - ONE LYMPH NODE POSITIVE FOR METASTATIC DUCTAL CARCINOMA (1/1). - SEE COMMENT. 3. Lymph node, sentinel, biopsy,  Left axilla - ONE BENIGN LYMPH NODE WITH NO TUMOR SEEN (0/1). 4. Lymph node, sentinel, biopsy, Left axilla - ONE BENIGN LYMPH NODE WITH NO TUMOR SEEN (0/1). 5. Breast, simple mastectomy, Left - INVASIVE GRADE I DUCTAL CARCINOMA, TWO FOCI MEASURING 0.8 CM AND 0.6 CM ARISING IN A BACKGROUND OF EXTENSIVE INTERMEDIATE GRADE DUCTAL CARCINOMA IN SITU WITH NECROSIS. - MARGINS ARE NEGATIVE. - SEE ONCOLOGY TEMPLATE. Microscopic Comment 2. On re-review of the frozen section slides, no metastatic carcinoma is identified. The carcinoma present is on permanent sections only. 5. BREAST, INVASIVE TUMOR, WITH LYMPH NODE SAMPLING Specimen, including laterality: Left breast with sentinel lymph node. Procedure: Left simple mastectomy with sentinel lymph node biopsy. Grade: I (Both foci of tumors show similar morphology). Tubule formation: 2. Nuclear pleomorphism: 2. Mitotic: 1. Tumor size (glass slide measurement): Two foci measuring 0.8 cm and 0.6 cm. Margins: Invasive, distance to closest margin: At least 1.5 cm. In-situ, distance to closest margin: At least 1.5 cm. Lymphovascular invasion: No definite lymph/vascular invasion is not identified however a sentinel lymph node is positive (see below). Ductal carcinoma in situ: Yes. Grade: Intermediate grade. Extensive intraductal component: Yes. Lobular neoplasia: Not identified. Tumor focality: Two foci. Treatment effect: Not applicable. Extent of tumor: Tumor confined to breast parenchyma. Lymph nodes: # examined: 4. Lymph nodes with metastasis: 1. Macrometastasis: (> 2.0 mm): 1. Extracapsular extension: Not identified. Breast prognostic profile: A breast prognostic profile will be performed (see below comment). Additional breast findings: Fibrocystic changes with usual ductal hyperplasia. TNM: mpT1b, pN1a, MX. Comment: The palpable nodular area seen grossly is comprised predominantly of intermediate grade ductal carcinoma in situ with only  two foci of invasive ductal carcinoma. Both invasive foci are morphologically similar. As such a breast prognostic profile will only be performed on the larger tumor. A breast prognostic profile can be performed upon the smaller tumor per request. (RH:kh 12-21-11) 2 of 4   ASSESSMENT: 53 year old female with  #1 stage II invasive ductal carcinoma of the left breast with ductal carcinoma in situ. Patient is status post mastectomy that showed 2 foci of invasive cancer measuring 0.8 0.6 cm. The tumor is ER positive PR positive HER-2/neu amplified at 3.27. Patient initially was begun on Taxotere carboplatinum and Herceptin. Unfortunately she developed significant side effects including septic knee. Her treatment was interrupted. She received 1 cycle of [place] on 01/27/2012. Thereafter she developed significant side effects and infections her therapy was delayed. Once her infection resolved she went on to receive another 2 cycles of [place] from 08/03/2012 through 10/19/2012. She continued to receive Herceptin initially q. weekly for a total of 5 cycles. Then resumed it to every 21 days for total of 12 cycles from 03/29/2012 through 08/03/2012.  #2 status post radiation therapy completed 06/04/2012.    #3 letrozole 2.5 mg starting September 2014.   PLAN:   #1 continue letrozole 2.5 mg daily.  #2 patient will be seen back in 6 months time for  followup   All questions were answered. The patient knows to call the clinic with any problems, questions or concerns. We can certainly see the patient much sooner if necessary.  I spent 25 minutes counseling the patient face to face. The total time spent in the appointment was 30 minutes.  Marcy Panning, MD Medical/Oncology [facility] [phone] (beeper) [phone] (Office)

## 2013-06-29 ENCOUNTER — Other Ambulatory Visit: Payer: Self-pay | Admitting: Adult Health

## 2013-07-02 ENCOUNTER — Other Ambulatory Visit: Payer: Self-pay | Admitting: *Deleted

## 2013-07-02 DIAGNOSIS — C50419 Malignant neoplasm of upper-outer quadrant of unspecified female breast: Secondary | ICD-10-CM

## 2013-07-02 MED ORDER — LETROZOLE 2.5 MG PO TABS: ORAL_TABLET | ORAL | Status: DC

## 2013-07-12 ENCOUNTER — Ambulatory Visit: Payer: 59 | Admitting: Family Medicine

## 2013-07-12 DIAGNOSIS — Z0289 Encounter for other administrative examinations: Secondary | ICD-10-CM

## 2013-07-19 ENCOUNTER — Ambulatory Visit (INDEPENDENT_AMBULATORY_CARE_PROVIDER_SITE_OTHER): Payer: 59 | Admitting: Family Medicine

## 2013-07-19 ENCOUNTER — Encounter: Payer: Self-pay | Admitting: Family Medicine

## 2013-07-19 VITALS — BP 124/84 | HR 70 | Temp 97.6°F | Resp 16 | Wt 211.5 lb

## 2013-07-19 DIAGNOSIS — I10 Essential (primary) hypertension: Secondary | ICD-10-CM

## 2013-07-19 DIAGNOSIS — K5909 Other constipation: Secondary | ICD-10-CM

## 2013-07-19 DIAGNOSIS — G35 Multiple sclerosis: Secondary | ICD-10-CM

## 2013-07-19 DIAGNOSIS — F3289 Other specified depressive episodes: Secondary | ICD-10-CM

## 2013-07-19 DIAGNOSIS — H6120 Impacted cerumen, unspecified ear: Secondary | ICD-10-CM

## 2013-07-19 DIAGNOSIS — G35D Multiple sclerosis, unspecified: Secondary | ICD-10-CM

## 2013-07-19 DIAGNOSIS — F329 Major depressive disorder, single episode, unspecified: Secondary | ICD-10-CM

## 2013-07-19 DIAGNOSIS — H918X9 Other specified hearing loss, unspecified ear: Secondary | ICD-10-CM

## 2013-07-19 DIAGNOSIS — K5903 Drug induced constipation: Secondary | ICD-10-CM | POA: Insufficient documentation

## 2013-07-19 DIAGNOSIS — F32A Depression, unspecified: Secondary | ICD-10-CM

## 2013-07-19 MED ORDER — BUPROPION HCL ER (XL) 150 MG PO TB24: 150.0000 mg | ORAL_TABLET | Freq: Every day | ORAL | Status: DC

## 2013-07-19 MED ORDER — ATENOLOL 25 MG PO TABS: 12.5000 mg | ORAL_TABLET | Freq: Every day | ORAL | Status: DC

## 2013-07-19 MED ORDER — LOSARTAN POTASSIUM-HCTZ 100-25 MG PO TABS: 1.0000 | ORAL_TABLET | Freq: Every day | ORAL | Status: DC

## 2013-07-19 MED ORDER — LUBIPROSTONE 24 MCG PO CAPS: 24.0000 ug | ORAL_CAPSULE | Freq: Two times a day (BID) | ORAL | Status: DC

## 2013-07-19 NOTE — Patient Instructions (Signed)
Follow up in 6-8 weeks to recheck mood STOP the Cymbalta 30mg - go back to 60mg  daily START the Amitiza twice daily- take w/ food and lots of water- for constipation After 1 week on Amitiza, start the Wellbutrin daily for depression (this will eliminate potential side effect overlap) Call with any questions or concerns Hang in there!!!

## 2013-07-19 NOTE — Progress Notes (Signed)
Pre visit review using our clinic review tool, if applicable. No additional management support is needed unless otherwise documented below in the visit note. 

## 2013-07-19 NOTE — Progress Notes (Signed)
   Subjective:    Patient ID: Julie Ross, female    DOB: 05-24-1961, 53 y.o.   MRN: [id number]  HPI Depression- Cymbalta was increased at last visit from 60mg  --> 90mg .  Pt does not notice much of a difference.  Cost is an issue.  'i feel crappy'.  No suicidal thoughts.  Pt not interested in seeing a counselor.  Pt was told by ortho that she will likely require surgery on R knee which is distressing due to long, complicated road w/ L knee surgery.  Constipation- a few weeks ago took Miralax x7 days and 'nothing happened'.  Then attempted Dulcolax w/ relief.   Is on chronic pain meds.  Decreased hearing- R ear   Review of Systems For ROS see HPI     Objective:   Physical Exam  Vitals reviewed. Constitutional: She appears well-developed and well-nourished. No distress.  HENT:  R TM obscurred by copious soft wax- attempts at curette were unsuccessful.  Attempted to irrigate x3 w/o results.  Abdominal: Soft. Bowel sounds are normal. She exhibits no distension. There is no tenderness. There is no rebound and no guarding.          Assessment & Plan:

## 2013-07-21 NOTE — Assessment & Plan Note (Signed)
New.  Start Amitiza.  Reviewed lifestyle modifications.  Will follow.

## 2013-07-21 NOTE — Assessment & Plan Note (Signed)
New.  Attempts at curette and irrigation were unsuccessful.  Encouraged pt to use H2O2 drops.  Will follow.

## 2013-07-21 NOTE — Assessment & Plan Note (Signed)
Unchanged.  Pt did not notice improvement w/ increased dose of Cymbalta.  Decrease back to 60 mg and add Wellbutrin.  Will follow closely.

## 2013-07-22 ENCOUNTER — Telehealth: Payer: Self-pay | Admitting: *Deleted

## 2013-07-22 NOTE — Telephone Encounter (Signed)
Prior authorization paperwork for [place] faxed. Awaiting response. JG//CMA

## 2013-07-24 NOTE — Telephone Encounter (Signed)
PA denied by [employer]. Form forwarded to Dr. Birdie Riddle for review. JG//CMA

## 2013-07-29 ENCOUNTER — Other Ambulatory Visit: Payer: Self-pay | Admitting: Orthopedic Surgery

## 2013-07-30 NOTE — Telephone Encounter (Signed)
According to last OV, pt's diagnosis is constipation caused by pain medication (opioids) and is ongoing (chronic).  Therefor she meets the criteria of opioid induced constipation in an adult w/ chronic non-cancer pain.  Please call insurance company for approval b/c she meets their criteria

## 2013-07-30 NOTE — Telephone Encounter (Signed)
[employer] stated that Amitiza can be approved if patient has a diagnosis of opoid-induced constipation in an adult with chronic, non-caner pain or a diagnosis of chronic idiopathic constipation or is a female with the diagnosis of IBS with failure of Linzess.  An appeal form was faxed to [place] by [employer]. Please advise. JG//CMA

## 2013-07-30 NOTE — Telephone Encounter (Signed)
[employer]. PA approved. They stated we will receive a fax confirmation with this approval as well as a letter and phone call to the patient. JG//CMA

## 2013-08-03 NOTE — Pre-Procedure Instructions (Signed)
[facility] - Preparing for Surgery  Before surgery, you can play an important role.  Because skin is not sterile, your skin needs to be as free of germs as possible.  You can reduce the number of germs on you skin by washing with CHG (chlorahexidine gluconate) soap before surgery.  CHG is an antiseptic cleaner which kills germs and bonds with the skin to continue killing germs even after washing.  Please DO NOT use if you have an allergy to CHG or antibacterial soaps.  If your skin becomes reddened/irritated stop using the CHG and inform your nurse when you arrive at Short Stay.  Do not shave (including legs and underarms) for at least 48 hours prior to the first CHG shower.  You may shave your face.  Please follow these instructions carefully:   1.  Shower with CHG Soap the night before surgery and the morning of Surgery.  2.  If you choose to wash your hair, wash your hair first as usual with your normal shampoo.  3.  After you shampoo, rinse your hair and body thoroughly to remove the shampoo.  4.  Use CHG as you would any other liquid soap.  You can apply CHG directly to the skin and wash gently with scrungie or a clean washcloth.  5.  Apply the CHG Soap to your body ONLY FROM THE NECK DOWN.  Do not use on open wounds or open sores.  Avoid contact with your eyes, ears, mouth and genitals (private parts).  Wash genitals (private parts) with your normal soap.  6.  Wash thoroughly, paying special attention to the area where your surgery will be performed.  7.  Thoroughly rinse your body with warm water from the neck down.  8.  DO NOT shower/wash with your normal soap after using and rinsing off the CHG Soap.  9.  Pat yourself dry with a clean towel.            10.  Wear clean pajamas.            11.  Place clean sheets on your bed the night of your first shower and do not sleep with pets.  Day of Surgery  Do not apply any lotions the morning of surgery.  Please wear clean clothes to the  hospital/surgery center.   

## 2013-08-05 ENCOUNTER — Encounter (HOSPITAL_COMMUNITY): Payer: Self-pay

## 2013-08-05 ENCOUNTER — Encounter (HOSPITAL_COMMUNITY)
Admission: RE | Admit: 2013-08-05 | Discharge: 2013-08-05 | Disposition: A | Discharge status: Home / Self Care | Payer: 59 | Source: Ambulatory Visit | Attending: Orthopedic Surgery | Admitting: Orthopedic Surgery

## 2013-08-05 ENCOUNTER — Ambulatory Visit (HOSPITAL_COMMUNITY)
Admission: RE | Admit: 2013-08-05 | Discharge: 2013-08-05 | Disposition: A | Discharge status: Home / Self Care | Payer: 59 | Source: Ambulatory Visit | Attending: Orthopedic Surgery | Admitting: Orthopedic Surgery

## 2013-08-05 DIAGNOSIS — Z9010 Acquired absence of unspecified breast and nipple: Secondary | ICD-10-CM | POA: Insufficient documentation

## 2013-08-05 DIAGNOSIS — I517 Cardiomegaly: Secondary | ICD-10-CM | POA: Insufficient documentation

## 2013-08-05 DIAGNOSIS — I10 Essential (primary) hypertension: Secondary | ICD-10-CM | POA: Insufficient documentation

## 2013-08-05 LAB — SURGICAL PCR SCREEN
MRSA, PCR: NEGATIVE
Staphylococcus aureus: NEGATIVE

## 2013-08-05 LAB — CBC WITH DIFFERENTIAL/PLATELET
Basophils Absolute: 0 10*3/uL (ref 0.0–0.1)
Basophils Relative: 0 % (ref 0–1)
Eosinophils Absolute: 0.4 10*3/uL (ref 0.0–0.7)
Eosinophils Relative: 5 % (ref 0–5)
HCT: 38 % (ref 36.0–46.0)
Hemoglobin: 12.8 g/dL (ref 12.0–15.0)
Lymphocytes Relative: 20 % (ref 12–46)
Lymphs Abs: 1.7 10*3/uL (ref 0.7–4.0)
MCH: 28.3 pg (ref 26.0–34.0)
MCHC: 33.7 g/dL (ref 30.0–36.0)
MCV: 83.9 fL (ref 78.0–100.0)
Monocytes Absolute: 0.6 10*3/uL (ref 0.1–1.0)
Monocytes Relative: 7 % (ref 3–12)
Neutro Abs: 6 10*3/uL (ref 1.7–7.7)
Neutrophils Relative %: 69 % (ref 43–77)
Platelets: 281 10*3/uL (ref 150–400)
RBC: 4.53 MIL/uL (ref 3.87–5.11)
RDW: 13.5 % (ref 11.5–15.5)
WBC: 8.8 10*3/uL (ref 4.0–10.5)

## 2013-08-05 LAB — BASIC METABOLIC PANEL
BUN: 22 mg/dL (ref 6–23)
CO2: 24 mEq/L (ref 19–32)
Calcium: 9.8 mg/dL (ref 8.4–10.5)
Chloride: 97 mEq/L (ref 96–112)
Creatinine, Ser: 1.16 mg/dL — ABNORMAL HIGH (ref 0.50–1.10)
GFR calc Af Amer: 61 mL/min — ABNORMAL LOW (ref >90)
GFR calc non Af Amer: 52 mL/min — ABNORMAL LOW (ref >90)
Glucose, Bld: 143 mg/dL — ABNORMAL HIGH (ref 70–99)
Potassium: 3.8 mEq/L (ref 3.7–5.3)
Sodium: 138 mEq/L (ref 137–147)

## 2013-08-05 LAB — PROTIME-INR
INR: 0.85 (ref 0.00–1.49)
Prothrombin Time: 11.5 seconds — ABNORMAL LOW (ref 11.6–15.2)

## 2013-08-05 LAB — URINALYSIS, ROUTINE W REFLEX MICROSCOPIC
Bilirubin Urine: NEGATIVE
Glucose, UA: NEGATIVE mg/dL
Hgb urine dipstick: NEGATIVE
Ketones, ur: NEGATIVE mg/dL
Nitrite: NEGATIVE
Protein, ur: NEGATIVE mg/dL
Specific Gravity, Urine: 1.014 (ref 1.005–1.030)
Urobilinogen, UA: 0.2 mg/dL (ref 0.0–1.0)
pH: 5.5 (ref 5.0–8.0)

## 2013-08-05 LAB — TYPE AND SCREEN
ABO/RH(D): O POS
Antibody Screen: NEGATIVE

## 2013-08-05 LAB — URINE MICROSCOPIC-ADD ON

## 2013-08-05 LAB — APTT: aPTT: 31 seconds (ref 24–37)

## 2013-08-05 MED ORDER — DEXTROSE-NACL 5-0.45 % IV SOLN: INTRAVENOUS | Status: DC

## 2013-08-05 NOTE — Pre-Procedure Instructions (Signed)
Julie Ross  08/05/2013   Your procedure is scheduled on:  08/12/13  Report to [facility]  2 * 3 at 845 AM.  Call this number if you have problems the morning of surgery: [phone]   Remember:   Do not eat food or drink liquids after midnight.   Take these medicines the morning of surgery with A SIP OF WATER: xanax,norvasc,atenolol,baclofen,cymbalta,nexium,neurontin,oxycontin   Do not wear jewelry, make-up or nail polish.  Do not wear lotions, powders, or perfumes. You may wear deodorant.  Do not shave 48 hours prior to surgery. Men may shave face and neck.  Do not bring valuables to the hospital.  [facility] is not responsible                  for any belongings or valuables.               Contacts, dentures or bridgework may not be worn into surgery.  Leave suitcase in the car. After surgery it may be brought to your room.  For patients admitted to the hospital, discharge time is determined by your                treatment team.               Patients discharged the day of surgery will not be allowed to drive  home.  Name and phone number of your driver:   Special Instructions: Shower using CHG 2 nights before surgery and the night before surgery.  If you shower the day of surgery use CHG.  Use special wash - you have one bottle of CHG for all showers.  You should use approximately 1/3 of the bottle for each shower.   Please read over the following fact sheets that you were given: Pain Booklet, Coughing and Deep Breathing, Blood Transfusion Information, MRSA Information and Surgical Site Infection Prevention

## 2013-08-09 NOTE — H&P (Signed)
TOTAL KNEE ADMISSION H&P  Patient is being admitted for right total knee arthroplasty.  Subjective:  Chief Complaint:right knee pain.  HPI: Julie Ross, 53 y.o. female, has a history of pain and functional disability in the right knee due to arthritis and has failed non-surgical conservative treatments for greater than 12 weeks to includeNSAID's and/or analgesics, viscosupplementation injections, flexibility and strengthening excercises, use of assistive devices and activity modification.  Onset of symptoms was gradual, starting >10 years ago with gradually worsening course since that time. The patient noted no past surgery on the right knee(s).  Patient currently rates pain in the right knee(s) at 10 out of 10 with activity. Patient has worsening of pain with activity and weight bearing, pain that interferes with activities of daily living, pain with passive range of motion and crepitus.  Patient has evidence of subchondral sclerosis, periarticular osteophytes and joint space narrowing by imaging studies.  There is no active infection.  Patient Active Problem List   Diagnosis Date Noted  . Constipation due to pain medication 07/19/2013  . Hearing loss secondary to cerumen impaction 07/19/2013  . Depression due to multiple sclerosis 06/10/2013  . Unspecified hypothyroidism 05/29/2013  . Esophageal reflux 05/29/2013  . Acquired absence of knee joint following removal of joint prosthesis with presence of antibiotic-impregnated cement spacer Left 04/19/2013  . Hx of radiation therapy   . Infection of prosthetic left knee joint 09/12/2012  . Osteoarthritis of right knee 09/12/2012  . Essential hypertension, benign 06/11/2012  . Septic joint of left knee joint 03/09/2012  . Hypokalemia 03/04/2012  . Hyponatremia 03/04/2012  . Bacteremia 03/04/2012  . Infected prosthetic knee joint 03/02/2012  . Seroma, postoperative 02/06/2012  . Dehydration 02/03/2012  . Multiple sclerosis 01/23/2012  .  Cancer of upper-outer quadrant of female breast 11/22/2011  . Osteoarthritis of both knees 08/15/2011  . Osteoarthritis of left knee 08/15/2011   Past Medical History  Diagnosis Date  . Hypertension   . Hypothyroidism   . Neuromuscular disorder     MS TX WITH CYMBALTA   . Multiple sclerosis   . H/O colonoscopy   . H/O bone density study 03/2011  . Wears glasses   . Heart burn   . Depression   . Hx of radiation therapy 04/17/12- 06/04/12    left chest wall, axilla, L supraclavicular fossa 4500 cGy, left mastectomy scar/chest wall 5940 cGy  . H/O echocardiogram     last one 02/2013, due to chemotherapy  . Anxiety     uses xanax mainly for sleep  . Sleep apnea     MILD SLEEP APNEA, NO (Needed) MACHINE  6 YRS AGO HPT REGIONAL   . GERD (gastroesophageal reflux disease)   . Breast cancer 11/2011    lul/ER+PR+, x1 lymph node   . Cancer     squamous cell skin cancer x7  . Arthritis     knees   . Anemia     h/o - ? bld. transfusion - more recent- ?2013    Past Surgical History  Procedure Laterality Date  . Tonsillectomy    . Tumor removal      FROM PELVIS AGE 64  . Cesarean section      X2   . No past surgeries      BLADDER SLING  4-5 YRS AGO   . Knee arthroscopy      LT KNEE 04/2011  . Total knee arthroplasty  08/15/2011    Procedure: TOTAL KNEE ARTHROPLASTY; lft Surgeon: Karen Chafe  Berenice Primas, MD;  Location: [place] [facility];  Service: Orthopedics;  Laterality: Left;  RIGHT KNEE CORTIZONE INJECTION  . Mohs surgery      right face  . Knee arthroscopy  82    rt  . Mastectomy w/ sentinel node biopsy  12/19/2011    Procedure: MASTECTOMY WITH SENTINEL LYMPH NODE BIOPSY;  Surgeon: Haywood Lasso, MD;  Location: [place];  Service: General;  Laterality: Left;  left breast and left axilla   . Portacath placement  01/16/2012    Procedure: INSERTION PORT-A-CATH;  Surgeon: Haywood Lasso, MD;  Location: [place];  Service: General;  Laterality: Right;  [place] Cath Placement   . I&d  knee with poly exchange  03/02/2012    Procedure: IRRIGATION AND DEBRIDEMENT KNEE WITH POLY EXCHANGE;  Surgeon: Alta Corning, MD;  Location: [place];  Service: Orthopedics;  Laterality: Left;  I&D of total knee with Possible Poly Exchange   . Tee without cardioversion  03/06/2012    Procedure: TRANSESOPHAGEAL ECHOCARDIOGRAM (TEE);  Surgeon: Josue Hector, MD;  Location: [place];  Service: Cardiovascular;  Laterality: N/A;  Rm. [id number]  . Port-a-cath removal  03/06/2012    Procedure: REMOVAL PORT-A-CATH;  Surgeon: Odis Hollingshead, MD;  Location: [place];  Service: General;  Laterality: N/A;  . Portacath placement  05/01/2012    Procedure: INSERTION PORT-A-CATH;  Surgeon: Haywood Lasso, MD;  Location: [facility];  Service: General;  Laterality: Right;  right internal jugular port-a-cath insertion  . Total knee arthroplasty Left 08/18/2012    Procedure:  Irrigation and debridement of LEFT total knee;  Removal of total knee parts; Implant of spacers;  Surgeon: Kerin Salen, MD;  Location: [place];  Service: Orthopedics;  Laterality: Left;  . Port-a-cath removal Right 08/22/2012    Procedure: REMOVAL PORT-A-CATH;  Surgeon: Edward Jolly, MD;  Location: [place];  Service: General;  Laterality: Right;  . Joint replacement  Feb 2013    left knee- multiple surgeries   . Mastectomy    . Total knee revision Left 04/19/2013    D  . Total knee revision Left 04/19/2013    Procedure: TOTAL KNEE REVISION AND REMOVAL CEMENT SPACER;  Surgeon: Kerin Salen, MD;  Location: [facility];  Service: Orthopedics;  Laterality: Left;    No prescriptions prior to admission   No Known Allergies  History  Substance Use Topics  . Smoking status: Never Smoker   . Smokeless tobacco: Never Used  . Alcohol Use: No    Family History  Problem Relation Age of Onset  . Breast cancer Paternal Aunt     dx in her 18s  . Heart disease Maternal Grandfather   . Lung cancer Maternal Uncle     smoker  . Breast  cancer Paternal Grandmother     dx in her 64s  . Ovarian cancer Paternal Aunt     dx in her 42s  . Arthritis Mother   . Hypertension Mother      Review of Systems  Constitutional: Negative.   HENT: Negative.   Eyes: Negative.   Respiratory: Negative.   Cardiovascular: Negative.   Gastrointestinal: Negative.   Genitourinary: Negative.   Musculoskeletal: Positive for joint pain.  Skin: Negative.   Neurological: Negative.   Endo/Heme/Allergies: Negative.   Psychiatric/Behavioral: Positive for depression.    Objective:  Physical Exam  Constitutional: She is oriented to person, place, and time. She appears well-developed and well-nourished.  HENT:  Head: Normocephalic  and atraumatic.  Eyes: Pupils are equal, round, and reactive to light.  Neck: Normal range of motion. Neck supple.  Cardiovascular: Intact distal pulses.   Respiratory: Effort normal.  Musculoskeletal: She exhibits tenderness.  Patient's right knee does have a large brace.  She does have severe pain with palpation over the medial compartment.  She has no calf pain with palpation.  Neurological: She is alert and oriented to person, place, and time.  Skin: Skin is warm and dry.  Psychiatric: She has a normal mood and affect. Her behavior is normal. Judgment and thought content normal.    Vital signs in last 24 hours:    Labs:   Estimated body mass index is 35.20 kg/(m^2) as calculated from the following:   Height as of 06/25/13: 5\' 5"  (1.651 m).   Weight as of 07/19/13: 95.936 kg (211 lb 8 oz).   Imaging Review Plain radiographs demonstrate severe degenerative joint disease of the right knee(s). The overall alignment ismild varus. The bone quality appears to be good for age and reported activity level.  Assessment/Plan:  End stage arthritis, right knee   The patient history, physical examination, clinical judgment of the provider and imaging studies are consistent with end stage degenerative joint disease  of the right knee(s) and total knee arthroplasty is deemed medically necessary. The treatment options including medical management, injection therapy arthroscopy and arthroplasty were discussed at length. The risks and benefits of total knee arthroplasty were presented and reviewed. The risks due to aseptic loosening, infection, stiffness, patella tracking problems, thromboembolic complications and other imponderables were discussed. The patient acknowledged the explanation, agreed to proceed with the plan and consent was signed. Patient is being admitted for inpatient treatment for surgery, pain control, PT, OT, prophylactic antibiotics, VTE prophylaxis, progressive ambulation and ADL's and discharge planning. The patient is planning to be discharged to skilled nursing facility

## 2013-08-11 MED ORDER — CEFAZOLIN SODIUM-DEXTROSE 2-3 GM-% IV SOLR
2.0000 g | INTRAVENOUS | Status: AC
  Administered 2013-08-12: 2 g via INTRAVENOUS
  Filled 2013-08-11: qty 50

## 2013-08-12 ENCOUNTER — Inpatient Hospital Stay (HOSPITAL_COMMUNITY): Payer: 59 | Admitting: Anesthesiology

## 2013-08-12 ENCOUNTER — Encounter (HOSPITAL_COMMUNITY): Payer: Self-pay | Admitting: *Deleted

## 2013-08-12 ENCOUNTER — Encounter (HOSPITAL_COMMUNITY): Payer: 59 | Admitting: Anesthesiology

## 2013-08-12 ENCOUNTER — Inpatient Hospital Stay (HOSPITAL_COMMUNITY)
Admission: RE | Admit: 2013-08-12 | Discharge: 2013-08-14 | DRG: 470 | Disposition: A | Discharge status: Skilled Nursing Facility | Payer: 59 | Source: Ambulatory Visit | Attending: Orthopedic Surgery | Admitting: Orthopedic Surgery

## 2013-08-12 ENCOUNTER — Encounter (HOSPITAL_COMMUNITY)
Admission: RE | Disposition: A | Discharge status: Skilled Nursing Facility | Payer: Self-pay | Source: Ambulatory Visit | Attending: Orthopedic Surgery

## 2013-08-12 DIAGNOSIS — Z96659 Presence of unspecified artificial knee joint: Secondary | ICD-10-CM

## 2013-08-12 DIAGNOSIS — Z6834 Body mass index (BMI) 34.0-34.9, adult: Secondary | ICD-10-CM

## 2013-08-12 DIAGNOSIS — F411 Generalized anxiety disorder: Secondary | ICD-10-CM | POA: Diagnosis present

## 2013-08-12 DIAGNOSIS — H9190 Unspecified hearing loss, unspecified ear: Secondary | ICD-10-CM | POA: Diagnosis present

## 2013-08-12 DIAGNOSIS — F3289 Other specified depressive episodes: Secondary | ICD-10-CM | POA: Diagnosis present

## 2013-08-12 DIAGNOSIS — I10 Essential (primary) hypertension: Secondary | ICD-10-CM | POA: Diagnosis present

## 2013-08-12 DIAGNOSIS — Z853 Personal history of malignant neoplasm of breast: Secondary | ICD-10-CM

## 2013-08-12 DIAGNOSIS — Z8041 Family history of malignant neoplasm of ovary: Secondary | ICD-10-CM

## 2013-08-12 DIAGNOSIS — F329 Major depressive disorder, single episode, unspecified: Secondary | ICD-10-CM | POA: Diagnosis present

## 2013-08-12 DIAGNOSIS — Z85828 Personal history of other malignant neoplasm of skin: Secondary | ICD-10-CM

## 2013-08-12 DIAGNOSIS — Z8249 Family history of ischemic heart disease and other diseases of the circulatory system: Secondary | ICD-10-CM

## 2013-08-12 DIAGNOSIS — M1710 Unilateral primary osteoarthritis, unspecified knee: Principal | ICD-10-CM | POA: Diagnosis present

## 2013-08-12 DIAGNOSIS — K219 Gastro-esophageal reflux disease without esophagitis: Secondary | ICD-10-CM | POA: Diagnosis present

## 2013-08-12 DIAGNOSIS — Z801 Family history of malignant neoplasm of trachea, bronchus and lung: Secondary | ICD-10-CM

## 2013-08-12 DIAGNOSIS — Z803 Family history of malignant neoplasm of breast: Secondary | ICD-10-CM

## 2013-08-12 DIAGNOSIS — E669 Obesity, unspecified: Secondary | ICD-10-CM | POA: Diagnosis present

## 2013-08-12 DIAGNOSIS — G35 Multiple sclerosis: Secondary | ICD-10-CM | POA: Diagnosis present

## 2013-08-12 DIAGNOSIS — M1711 Unilateral primary osteoarthritis, right knee: Secondary | ICD-10-CM | POA: Diagnosis present

## 2013-08-12 DIAGNOSIS — G4730 Sleep apnea, unspecified: Secondary | ICD-10-CM | POA: Diagnosis present

## 2013-08-12 DIAGNOSIS — E039 Hypothyroidism, unspecified: Secondary | ICD-10-CM | POA: Diagnosis present

## 2013-08-12 DIAGNOSIS — Z923 Personal history of irradiation: Secondary | ICD-10-CM

## 2013-08-12 DIAGNOSIS — G8929 Other chronic pain: Secondary | ICD-10-CM | POA: Diagnosis present

## 2013-08-12 HISTORY — PX: TOTAL KNEE ARTHROPLASTY: SHX125

## 2013-08-12 LAB — GRAM STAIN

## 2013-08-12 SURGERY — ARTHROPLASTY, KNEE, TOTAL
Anesthesia: General | Site: Knee | Laterality: Right

## 2013-08-12 MED ORDER — GLYCOPYRROLATE 0.2 MG/ML IJ SOLN
INTRAMUSCULAR | Status: DC | PRN
  Administered 2013-08-12: 0.6 mg via INTRAVENOUS

## 2013-08-12 MED ORDER — BUPIVACAINE LIPOSOME 1.3 % IJ SUSP
20.0000 mL | Freq: Once | INTRAMUSCULAR | Status: DC
  Filled 2013-08-12: qty 20

## 2013-08-12 MED ORDER — METHOCARBAMOL 500 MG PO TABS
500.0000 mg | ORAL_TABLET | Freq: Four times a day (QID) | ORAL | Status: DC | PRN
  Administered 2013-08-12 – 2013-08-14 (×6): 500 mg via ORAL
  Filled 2013-08-12 (×6): qty 1

## 2013-08-12 MED ORDER — ONDANSETRON HCL 4 MG/2ML IJ SOLN
INTRAMUSCULAR | Status: DC | PRN
  Administered 2013-08-12: 4 mg via INTRAVENOUS

## 2013-08-12 MED ORDER — CEFUROXIME SODIUM 1.5 G IJ SOLR
INTRAMUSCULAR | Status: AC
  Filled 2013-08-12: qty 1.5

## 2013-08-12 MED ORDER — LOSARTAN POTASSIUM 50 MG PO TABS
100.0000 mg | ORAL_TABLET | Freq: Every day | ORAL | Status: DC
  Administered 2013-08-13 – 2013-08-14 (×2): 100 mg via ORAL
  Filled 2013-08-12 (×4): qty 2

## 2013-08-12 MED ORDER — PROMETHAZINE HCL 25 MG/ML IJ SOLN: 6.2500 mg | INTRAMUSCULAR | Status: DC | PRN

## 2013-08-12 MED ORDER — GABAPENTIN 300 MG PO CAPS
300.0000 mg | ORAL_CAPSULE | Freq: Two times a day (BID) | ORAL | Status: DC
  Administered 2013-08-12 – 2013-08-14 (×4): 300 mg via ORAL
  Filled 2013-08-12 (×7): qty 1

## 2013-08-12 MED ORDER — FENTANYL CITRATE 0.05 MG/ML IJ SOLN
INTRAMUSCULAR | Status: AC
  Filled 2013-08-12: qty 5

## 2013-08-12 MED ORDER — CHLORHEXIDINE GLUCONATE 4 % EX LIQD: 1.0000 "application " | Freq: Once | CUTANEOUS | Status: DC

## 2013-08-12 MED ORDER — SODIUM CHLORIDE 0.9 % IJ SOLN
INTRAMUSCULAR | Status: DC | PRN
  Administered 2013-08-12 (×2)

## 2013-08-12 MED ORDER — HYDROMORPHONE HCL PF 1 MG/ML IJ SOLN
1.0000 mg | INTRAMUSCULAR | Status: DC | PRN
  Administered 2013-08-12 – 2013-08-14 (×10): 1 mg via INTRAVENOUS
  Filled 2013-08-12 (×11): qty 1

## 2013-08-12 MED ORDER — DIPHENHYDRAMINE HCL 12.5 MG/5ML PO ELIX: 12.5000 mg | ORAL_SOLUTION | ORAL | Status: DC | PRN

## 2013-08-12 MED ORDER — PHENYLEPHRINE 40 MCG/ML (10ML) SYRINGE FOR IV PUSH (FOR BLOOD PRESSURE SUPPORT)
PREFILLED_SYRINGE | INTRAVENOUS | Status: AC
  Filled 2013-08-12: qty 10

## 2013-08-12 MED ORDER — ACETAMINOPHEN 650 MG RE SUPP: 650.0000 mg | Freq: Four times a day (QID) | RECTAL | Status: DC | PRN

## 2013-08-12 MED ORDER — OXYCODONE HCL 5 MG PO TABS: 5.0000 mg | ORAL_TABLET | Freq: Once | ORAL | Status: DC | PRN

## 2013-08-12 MED ORDER — ONDANSETRON HCL 4 MG PO TABS: 4.0000 mg | ORAL_TABLET | Freq: Four times a day (QID) | ORAL | Status: DC | PRN

## 2013-08-12 MED ORDER — ROCURONIUM BROMIDE 50 MG/5ML IV SOLN
INTRAVENOUS | Status: AC
  Filled 2013-08-12: qty 1

## 2013-08-12 MED ORDER — LIDOCAINE HCL (CARDIAC) 20 MG/ML IV SOLN
INTRAVENOUS | Status: AC
  Filled 2013-08-12: qty 5

## 2013-08-12 MED ORDER — HYDROMORPHONE HCL PF 1 MG/ML IJ SOLN
INTRAMUSCULAR | Status: AC
  Filled 2013-08-12: qty 1

## 2013-08-12 MED ORDER — LACTATED RINGERS IV SOLN
INTRAVENOUS | Status: DC | PRN
  Administered 2013-08-12 (×2): via INTRAVENOUS

## 2013-08-12 MED ORDER — OXYCODONE HCL ER 10 MG PO T12A
10.0000 mg | EXTENDED_RELEASE_TABLET | Freq: Two times a day (BID) | ORAL | Status: DC
  Administered 2013-08-12 – 2013-08-14 (×4): 10 mg via ORAL
  Filled 2013-08-12 (×4): qty 1

## 2013-08-12 MED ORDER — SODIUM CHLORIDE 0.9 % IV SOLN
1000.0000 mg | INTRAVENOUS | Status: AC
  Administered 2013-08-12: 1000 mg via INTRAVENOUS
  Filled 2013-08-12: qty 10

## 2013-08-12 MED ORDER — ALUM & MAG HYDROXIDE-SIMETH 200-200-20 MG/5ML PO SUSP: 30.0000 mL | ORAL | Status: DC | PRN

## 2013-08-12 MED ORDER — KCL IN DEXTROSE-NACL 20-5-0.45 MEQ/L-%-% IV SOLN
INTRAVENOUS | Status: DC
  Administered 2013-08-12 (×2): via INTRAVENOUS
  Filled 2013-08-12 (×8): qty 1000

## 2013-08-12 MED ORDER — ASPIRIN EC 325 MG PO TBEC
325.0000 mg | DELAYED_RELEASE_TABLET | Freq: Every day | ORAL | Status: DC
  Administered 2013-08-14: 325 mg via ORAL
  Filled 2013-08-12 (×3): qty 1

## 2013-08-12 MED ORDER — MIDAZOLAM HCL 2 MG/2ML IJ SOLN
INTRAMUSCULAR | Status: AC
  Filled 2013-08-12: qty 2

## 2013-08-12 MED ORDER — MAGNESIUM CITRATE PO SOLN: 1.0000 | Freq: Once | ORAL | Status: AC | PRN

## 2013-08-12 MED ORDER — DOCUSATE SODIUM 100 MG PO CAPS
100.0000 mg | ORAL_CAPSULE | Freq: Two times a day (BID) | ORAL | Status: DC
  Administered 2013-08-12 – 2013-08-14 (×5): 100 mg via ORAL
  Filled 2013-08-12 (×5): qty 1

## 2013-08-12 MED ORDER — ACETAMINOPHEN 325 MG PO TABS: 650.0000 mg | ORAL_TABLET | Freq: Four times a day (QID) | ORAL | Status: DC | PRN

## 2013-08-12 MED ORDER — BUPIVACAINE-EPINEPHRINE PF 0.5-1:200000 % IJ SOLN
INTRAMUSCULAR | Status: DC | PRN
  Administered 2013-08-12: 25 mL

## 2013-08-12 MED ORDER — BUPROPION HCL ER (XL) 150 MG PO TB24
150.0000 mg | ORAL_TABLET | Freq: Every day | ORAL | Status: DC
  Administered 2013-08-13: 150 mg via ORAL
  Filled 2013-08-12 (×3): qty 1

## 2013-08-12 MED ORDER — METOCLOPRAMIDE HCL 10 MG PO TABS: 5.0000 mg | ORAL_TABLET | Freq: Three times a day (TID) | ORAL | Status: DC | PRN

## 2013-08-12 MED ORDER — LOSARTAN POTASSIUM-HCTZ 100-25 MG PO TABS: 1.0000 | ORAL_TABLET | Freq: Every day | ORAL | Status: DC

## 2013-08-12 MED ORDER — DULOXETINE HCL 60 MG PO CPEP
60.0000 mg | ORAL_CAPSULE | Freq: Every day | ORAL | Status: DC
  Administered 2013-08-13 – 2013-08-14 (×2): 60 mg via ORAL
  Filled 2013-08-12 (×2): qty 1

## 2013-08-12 MED ORDER — SODIUM CHLORIDE 0.9 % IR SOLN
Status: DC | PRN
  Administered 2013-08-12 (×2): 1000 mL

## 2013-08-12 MED ORDER — BISACODYL 5 MG PO TBEC: 5.0000 mg | DELAYED_RELEASE_TABLET | Freq: Every day | ORAL | Status: DC | PRN

## 2013-08-12 MED ORDER — GLYCOPYRROLATE 0.2 MG/ML IJ SOLN
INTRAMUSCULAR | Status: AC
  Filled 2013-08-12: qty 3

## 2013-08-12 MED ORDER — LIDOCAINE HCL (CARDIAC) 20 MG/ML IV SOLN
INTRAVENOUS | Status: DC | PRN
  Administered 2013-08-12: 100 mg via INTRAVENOUS

## 2013-08-12 MED ORDER — ONDANSETRON HCL 4 MG/2ML IJ SOLN
4.0000 mg | Freq: Four times a day (QID) | INTRAMUSCULAR | Status: DC | PRN
  Administered 2013-08-13 (×2): 4 mg via INTRAVENOUS
  Filled 2013-08-12 (×3): qty 2

## 2013-08-12 MED ORDER — OXYCODONE HCL 5 MG PO TABS
5.0000 mg | ORAL_TABLET | ORAL | Status: DC | PRN
  Administered 2013-08-12: 5 mg via ORAL
  Administered 2013-08-12 – 2013-08-14 (×11): 10 mg via ORAL
  Filled 2013-08-12 (×12): qty 2
  Filled 2013-08-12: qty 1

## 2013-08-12 MED ORDER — NEOSTIGMINE METHYLSULFATE 1 MG/ML IJ SOLN
INTRAMUSCULAR | Status: DC | PRN
  Administered 2013-08-12: 4 mg via INTRAVENOUS

## 2013-08-12 MED ORDER — SODIUM CHLORIDE 0.9 % IJ SOLN
INTRAMUSCULAR | Status: AC
  Filled 2013-08-12: qty 10

## 2013-08-12 MED ORDER — ASPIRIN EC 325 MG PO TBEC: 325.0000 mg | DELAYED_RELEASE_TABLET | Freq: Two times a day (BID) | ORAL | Status: DC

## 2013-08-12 MED ORDER — SENNOSIDES-DOCUSATE SODIUM 8.6-50 MG PO TABS: 1.0000 | ORAL_TABLET | Freq: Every evening | ORAL | Status: DC | PRN

## 2013-08-12 MED ORDER — ROCURONIUM BROMIDE 100 MG/10ML IV SOLN
INTRAVENOUS | Status: DC | PRN
  Administered 2013-08-12: 50 mg via INTRAVENOUS

## 2013-08-12 MED ORDER — HYDROCHLOROTHIAZIDE 25 MG PO TABS
25.0000 mg | ORAL_TABLET | Freq: Every day | ORAL | Status: DC
  Administered 2013-08-13 – 2013-08-14 (×2): 25 mg via ORAL
  Filled 2013-08-12 (×2): qty 1

## 2013-08-12 MED ORDER — ALPRAZOLAM 0.5 MG PO TABS
0.5000 mg | ORAL_TABLET | Freq: Every day | ORAL | Status: DC
  Administered 2013-08-12 – 2013-08-13 (×2): 0.5 mg via ORAL
  Filled 2013-08-12 (×2): qty 1

## 2013-08-12 MED ORDER — FENTANYL CITRATE 0.05 MG/ML IJ SOLN: 50.0000 ug | Freq: Once | INTRAMUSCULAR | Status: DC

## 2013-08-12 MED ORDER — MIDAZOLAM HCL 2 MG/2ML IJ SOLN
1.0000 mg | INTRAMUSCULAR | Status: DC | PRN
Start: 2013-08-12 — End: 2013-08-12

## 2013-08-12 MED ORDER — PROPOFOL 10 MG/ML IV BOLUS
INTRAVENOUS | Status: AC
  Filled 2013-08-12: qty 20

## 2013-08-12 MED ORDER — METHOCARBAMOL 500 MG PO TABS: 500.0000 mg | ORAL_TABLET | Freq: Two times a day (BID) | ORAL | Status: DC

## 2013-08-12 MED ORDER — METHOCARBAMOL 100 MG/ML IJ SOLN
500.0000 mg | Freq: Four times a day (QID) | INTRAVENOUS | Status: DC | PRN
  Filled 2013-08-12: qty 5

## 2013-08-12 MED ORDER — ONDANSETRON HCL 4 MG/2ML IJ SOLN
INTRAMUSCULAR | Status: AC
  Filled 2013-08-12: qty 4

## 2013-08-12 MED ORDER — NEOSTIGMINE METHYLSULFATE 1 MG/ML IJ SOLN
INTRAMUSCULAR | Status: AC
  Filled 2013-08-12: qty 10

## 2013-08-12 MED ORDER — LETROZOLE 2.5 MG PO TABS
2.5000 mg | ORAL_TABLET | Freq: Every day | ORAL | Status: DC
  Administered 2013-08-12 – 2013-08-14 (×3): 2.5 mg via ORAL
  Filled 2013-08-12 (×4): qty 1

## 2013-08-12 MED ORDER — DEXAMETHASONE SODIUM PHOSPHATE 10 MG/ML IJ SOLN
INTRAMUSCULAR | Status: DC | PRN
  Administered 2013-08-12: 4 mg

## 2013-08-12 MED ORDER — EPHEDRINE SULFATE 50 MG/ML IJ SOLN
INTRAMUSCULAR | Status: AC
  Filled 2013-08-12: qty 1

## 2013-08-12 MED ORDER — OXYCODONE HCL 5 MG/5ML PO SOLN: 5.0000 mg | Freq: Once | ORAL | Status: DC | PRN

## 2013-08-12 MED ORDER — MIDAZOLAM HCL 5 MG/5ML IJ SOLN
INTRAMUSCULAR | Status: DC | PRN
  Administered 2013-08-12 (×2): 2 mg via INTRAVENOUS

## 2013-08-12 MED ORDER — METOCLOPRAMIDE HCL 5 MG/ML IJ SOLN: 5.0000 mg | Freq: Three times a day (TID) | INTRAMUSCULAR | Status: DC | PRN

## 2013-08-12 MED ORDER — ATENOLOL 12.5 MG HALF TABLET
12.5000 mg | ORAL_TABLET | Freq: Every day | ORAL | Status: DC
  Administered 2013-08-13 – 2013-08-14 (×2): 12.5 mg via ORAL
  Filled 2013-08-12 (×3): qty 1

## 2013-08-12 MED ORDER — HYDROMORPHONE HCL PF 1 MG/ML IJ SOLN
0.2500 mg | INTRAMUSCULAR | Status: DC | PRN
  Administered 2013-08-12: 0.5 mg via INTRAVENOUS
  Administered 2013-08-12: 0.25 mg via INTRAVENOUS
  Administered 2013-08-12: 0.5 mg via INTRAVENOUS
  Administered 2013-08-12: 0.25 mg via INTRAVENOUS

## 2013-08-12 MED ORDER — CEFUROXIME SODIUM 1.5 G IJ SOLR
INTRAMUSCULAR | Status: DC | PRN
  Administered 2013-08-12: 1.5 g

## 2013-08-12 MED ORDER — OXYCODONE-ACETAMINOPHEN 10-325 MG PO TABS: 1.0000 | ORAL_TABLET | ORAL | Status: DC | PRN

## 2013-08-12 MED ORDER — MENTHOL 3 MG MT LOZG: 1.0000 | LOZENGE | OROMUCOSAL | Status: DC | PRN

## 2013-08-12 MED ORDER — EPHEDRINE SULFATE 50 MG/ML IJ SOLN
INTRAMUSCULAR | Status: DC | PRN
  Administered 2013-08-12: 10 mg via INTRAVENOUS

## 2013-08-12 MED ORDER — CEFAZOLIN SODIUM-DEXTROSE 2-3 GM-% IV SOLR
INTRAVENOUS | Status: AC
  Filled 2013-08-12: qty 50

## 2013-08-12 MED ORDER — FENTANYL CITRATE 0.05 MG/ML IJ SOLN
INTRAMUSCULAR | Status: DC | PRN
  Administered 2013-08-12: 50 ug via INTRAVENOUS
  Administered 2013-08-12: 100 ug via INTRAVENOUS
  Administered 2013-08-12: 150 ug via INTRAVENOUS
  Administered 2013-08-12 (×2): 100 ug via INTRAVENOUS
  Administered 2013-08-12: 50 ug via INTRAVENOUS
  Administered 2013-08-12: 100 ug via INTRAVENOUS

## 2013-08-12 MED ORDER — PROPOFOL 10 MG/ML IV BOLUS
INTRAVENOUS | Status: DC | PRN
  Administered 2013-08-12: 180 mg via INTRAVENOUS

## 2013-08-12 MED ORDER — PANTOPRAZOLE SODIUM 40 MG PO TBEC
80.0000 mg | DELAYED_RELEASE_TABLET | Freq: Every day | ORAL | Status: DC
  Administered 2013-08-13 – 2013-08-14 (×2): 80 mg via ORAL
  Filled 2013-08-12 (×2): qty 2

## 2013-08-12 MED ORDER — AMLODIPINE BESYLATE 10 MG PO TABS
10.0000 mg | ORAL_TABLET | Freq: Every day | ORAL | Status: DC
  Administered 2013-08-13 – 2013-08-14 (×2): 10 mg via ORAL
  Filled 2013-08-12 (×3): qty 1

## 2013-08-12 MED ORDER — CHLORHEXIDINE GLUCONATE 4 % EX LIQD: 60.0000 mL | Freq: Once | CUTANEOUS | Status: DC

## 2013-08-12 MED ORDER — PHENOL 1.4 % MT LIQD: 1.0000 | OROMUCOSAL | Status: DC | PRN

## 2013-08-12 SURGICAL SUPPLY — 62 items
BANDAGE ELASTIC 6 VELCRO ST LF (GAUZE/BANDAGES/DRESSINGS) IMPLANT
BANDAGE ESMARK 6X9 LF (GAUZE/BANDAGES/DRESSINGS) ×1 IMPLANT
BLADE SAG 18X100X1.27 (BLADE) ×2 IMPLANT
BLADE SAW SGTL 13X75X1.27 (BLADE) ×2 IMPLANT
BLADE SURG ROTATE 9660 (MISCELLANEOUS) IMPLANT
BNDG CMPR 9X6 STRL LF SNTH (GAUZE/BANDAGES/DRESSINGS) ×1
BNDG CMPR MED 10X6 ELC LF (GAUZE/BANDAGES/DRESSINGS) ×1
BNDG ELASTIC 6X10 VLCR STRL LF (GAUZE/BANDAGES/DRESSINGS) ×2 IMPLANT
BNDG ESMARK 6X9 LF (GAUZE/BANDAGES/DRESSINGS) ×2
BOWL SMART MIX CTS (DISPOSABLE) ×2 IMPLANT
CAPT RP KNEE ×1 IMPLANT
CEMENT HV SMART SET (Cement) ×4 IMPLANT
CLOTH BEACON ORANGE TIMEOUT ST (SAFETY) ×2 IMPLANT
COVER SURGICAL LIGHT HANDLE (MISCELLANEOUS) ×2 IMPLANT
CUFF TOURNIQUET SINGLE 34IN LL (TOURNIQUET CUFF) ×1 IMPLANT
CUFF TOURNIQUET SINGLE 44IN (TOURNIQUET CUFF) IMPLANT
DRAPE EXTREMITY T 121X128X90 (DRAPE) ×2 IMPLANT
DRAPE U-SHAPE 47X51 STRL (DRAPES) ×2 IMPLANT
DRSG PAD ABDOMINAL 8X10 ST (GAUZE/BANDAGES/DRESSINGS) ×1 IMPLANT
DURAPREP 26ML APPLICATOR (WOUND CARE) ×2 IMPLANT
ELECT REM PT RETURN 9FT ADLT (ELECTROSURGICAL) ×2
ELECTRODE REM PT RTRN 9FT ADLT (ELECTROSURGICAL) ×1 IMPLANT
EVACUATOR 1/8 PVC DRAIN (DRAIN) ×2 IMPLANT
GAUZE XEROFORM 1X8 LF (GAUZE/BANDAGES/DRESSINGS) ×2 IMPLANT
GLOVE BIO SURGEON STRL SZ7.5 (GLOVE) ×2 IMPLANT
GLOVE BIO SURGEON STRL SZ8.5 (GLOVE) ×2 IMPLANT
GLOVE BIOGEL PI IND STRL 8 (GLOVE) ×1 IMPLANT
GLOVE BIOGEL PI IND STRL 9 (GLOVE) ×1 IMPLANT
GLOVE BIOGEL PI INDICATOR 8 (GLOVE) ×1
GLOVE BIOGEL PI INDICATOR 9 (GLOVE) ×1
GOWN PREVENTION PLUS XLARGE (GOWN DISPOSABLE) ×3 IMPLANT
GOWN STRL NON-REIN LRG LVL3 (GOWN DISPOSABLE) ×3 IMPLANT
GOWN STRL REIN XL XLG (GOWN DISPOSABLE) ×1 IMPLANT
HANDPIECE INTERPULSE COAX TIP (DISPOSABLE) ×2
HOOD PEEL AWAY FACE SHEILD DIS (HOOD) ×5 IMPLANT
KIT BASIN OR (CUSTOM PROCEDURE TRAY) ×2 IMPLANT
KIT ROOM TURNOVER OR (KITS) ×2 IMPLANT
MANIFOLD NEPTUNE II (INSTRUMENTS) ×2 IMPLANT
NDL SAFETY ECLIPSE 18X1.5 (NEEDLE) IMPLANT
NDL SPNL 18GX3.5 QUINCKE PK (NEEDLE) IMPLANT
NEEDLE HYPO 18GX1.5 SHARP (NEEDLE)
NEEDLE SPNL 18GX3.5 QUINCKE PK (NEEDLE) ×2 IMPLANT
NS IRRIG 1000ML POUR BTL (IV SOLUTION) ×2 IMPLANT
PACK TOTAL JOINT (CUSTOM PROCEDURE TRAY) ×2 IMPLANT
PAD ARMBOARD 7.5X6 YLW CONV (MISCELLANEOUS) ×3 IMPLANT
PADDING CAST COTTON 6X4 STRL (CAST SUPPLIES) ×2 IMPLANT
SET HNDPC FAN SPRY TIP SCT (DISPOSABLE) ×1 IMPLANT
SPONGE GAUZE 4X4 12PLY (GAUZE/BANDAGES/DRESSINGS) ×3 IMPLANT
STAPLER VISISTAT 35W (STAPLE) ×2 IMPLANT
SUCTION FRAZIER TIP 10 FR DISP (SUCTIONS) ×2 IMPLANT
SUT VIC AB 0 CTX 36 (SUTURE) ×2
SUT VIC AB 0 CTX36XBRD ANTBCTR (SUTURE) ×1 IMPLANT
SUT VIC AB 1 CTX 36 (SUTURE) ×2
SUT VIC AB 1 CTX36XBRD ANBCTR (SUTURE) ×1 IMPLANT
SUT VIC AB 2-0 CT1 27 (SUTURE) ×2
SUT VIC AB 2-0 CT1 TAPERPNT 27 (SUTURE) ×1 IMPLANT
SWAB COLLECTION DEVICE MRSA (MISCELLANEOUS) ×1 IMPLANT
SYR 50ML LL SCALE MARK (SYRINGE) ×2 IMPLANT
TOWEL OR 17X24 6PK STRL BLUE (TOWEL DISPOSABLE) ×2 IMPLANT
TOWEL OR 17X26 10 PK STRL BLUE (TOWEL DISPOSABLE) ×2 IMPLANT
TUBE ANAEROBIC SPECIMEN COL (MISCELLANEOUS) ×1 IMPLANT
WATER STERILE IRR 1000ML POUR (IV SOLUTION) ×2 IMPLANT

## 2013-08-12 NOTE — Progress Notes (Signed)
Orthopedic Tech Progress Note Patient Details:  Julie Ross [facility] 1960-10-29 [id number] CPM applied to Right LE with appropriate settings. OHF applied to bed.  CPM Right Knee CPM Right Knee: On Right Knee Flexion (Degrees): 60 Right Knee Extension (Degrees): 0   Asia R Thompson 08/12/2013, 10:17 AM

## 2013-08-12 NOTE — Transfer of Care (Signed)
Immediate Anesthesia Transfer of Care Note  Patient: Julie Ross  Procedure(s) Performed: Procedure(s): RIGHT TOTAL KNEE ARTHROPLASTY (Right)  Patient Location: PACU  Anesthesia Type:General and GA combined with regional for post-op pain  Level of Consciousness: awake, alert , oriented and patient cooperative  Airway & Oxygen Therapy: Patient Spontanous Breathing and Patient connected to nasal cannula oxygen  Post-op Assessment: Report given to PACU RN, Post -op Vital signs reviewed and stable and Patient moving all extremities  Post vital signs: Reviewed and stable  Complications: No apparent anesthesia complications

## 2013-08-12 NOTE — Anesthesia Preprocedure Evaluation (Signed)
Anesthesia Evaluation  Patient identified by MRN, date of birth, ID band Patient awake    Reviewed: Allergy & Precautions, H&P , NPO status , Patient's Chart, lab work & pertinent test results  History of Anesthesia Complications Negative for: history of anesthetic complications  Airway Mallampati: III TM Distance: >3 FB Neck ROM: Full    Dental  (+) Teeth Intact, Dental Advisory Given   Pulmonary sleep apnea ,  breath sounds clear to auscultation        Cardiovascular hypertension, Pt. on medications Rhythm:Regular Rate:Normal     Neuro/Psych PSYCHIATRIC DISORDERS Anxiety Depression MS  Neuromuscular disease    GI/Hepatic Neg liver ROS, GERD-  Medicated and Controlled,  Endo/Other  Hypothyroidism   Renal/GU negative Renal ROS     Musculoskeletal   Abdominal (+) + obese,   Peds  Hematology negative hematology ROS (+)   Anesthesia Other Findings   Reproductive/Obstetrics negative OB ROS                           Anesthesia Physical Anesthesia Plan  ASA: III  Anesthesia Plan: General   Post-op Pain Management:    Induction: Intravenous  Airway Management Planned: Oral ETT  Additional Equipment:   Intra-op Plan:   Post-operative Plan: Extubation in OR  Informed Consent: I have reviewed the patients History and Physical, chart, labs and discussed the procedure including the risks, benefits and alternatives for the proposed anesthesia with the patient or authorized representative who has indicated his/her understanding and acceptance.     Plan Discussed with: CRNA and Surgeon  Anesthesia Plan Comments:         Anesthesia Quick Evaluation

## 2013-08-12 NOTE — Interval H&P Note (Signed)
History and Physical Interval Note:  08/12/2013 7:14 AM  Julie Ross  has presented today for surgery, with the diagnosis of OSTEOARTHRITIS RIGHT KNEE  The various methods of treatment have been discussed with the patient and family. After consideration of risks, benefits and other options for treatment, the patient has consented to  Procedure(s): RIGHT TOTAL KNEE ARTHROPLASTY (Right) as a surgical intervention .  The patient's history has been reviewed, patient examined, no change in status, stable for surgery.  I have reviewed the patient's chart and labs.  Questions were answered to the patient's satisfaction.     Kerin Salen

## 2013-08-12 NOTE — Anesthesia Procedure Notes (Signed)
Anesthesia Regional Block:  Adductor canal block  Pre-Anesthetic Checklist: ,, timeout performed, Correct Patient, Correct Site, Correct Laterality, Correct Procedure, Correct Position, site marked, Risks and benefits discussed,  Surgical consent,  Pre-op evaluation,  At surgeon's request and post-op pain management  Laterality: Right  Prep: chloraprep       Needles:  Injection technique: Single-shot  Needle Type: Echogenic Stimulator Needle     Needle Length: 9cm 9 cm Needle Gauge: 22 and 22 G    Additional Needles:  Procedures: ultrasound guided (picture in chart) Adductor canal block Narrative:  Start time: 08/12/2013 7:12 AM End time: 08/12/2013 7:24 AM Injection made incrementally with aspirations every 5 mL. Anesthesiologist: Dr Chriss Driver  Additional Notes: [id number] R Adductor Canal Block POP CHG prep, sterile tech Single stick w/good [place] localization-PIX in chart Multiple neg asp Marc .5% w/epi 1:200000 total 25cc+decadron 4mg  infiltrated No compl Dr Chriss Driver

## 2013-08-12 NOTE — Op Note (Signed)
PATIENT ID:      Julie Ross  MRN:     [id number] DOB/AGE:    23-Aug-1960 / 53 y.o.       OPERATIVE REPORT    DATE OF PROCEDURE:  08/12/2013       PREOPERATIVE DIAGNOSIS:   OSTEOARTHRITIS RIGHT KNEE      Estimated body mass index is 34.15 kg/(m^2) as calculated from the following:   Height as of 08/05/13: 5\' 6"  (1.676 m).   Weight as of 07/19/13: 95.936 kg (211 lb 8 oz).                                                        POSTOPERATIVE DIAGNOSIS:   OSTEOARTHRITIS RIGHT KNEE                                                                       PROCEDURE:  Procedure(s): RIGHT TOTAL KNEE ARTHROPLASTY Using Depuy Sigma RP implants #2.5R Femur, #3Tibia, 70mm sigma RP bearing, 35 Patella     SURGEON: Granvel Proudfoot J    ASSISTANT:   [employer]   (Present and scrubbed throughout the case, critical for assistance with exposure, retraction, instrumentation, and closure.)         ANESTHESIA: GET Exparel Adductor canal block  DRAINS: foley, 2 medium hemovac in knee   TOURNIQUET TIME: 85UDJ   COMPLICATIONS:  None     SPECIMENS: Synovial fluid sent for Gram stain and culture because of past history of infection and contralateral knee   INDICATIONS FOR PROCEDURE: The patient has  OSTEOARTHRITIS RIGHT KNEE, varus deformities, XR shows bone on bone arthritis. Patient has failed all conservative measures including anti-inflammatory medicines, narcotics, attempts at  exercise and weight loss, cortisone injections and viscosupplementation.  Risks and benefits of surgery have been discussed, questions answered.   DESCRIPTION OF PROCEDURE: The patient identified by armband, received  IV antibiotics, in the holding area at [facility]. Patient taken to the operating room, appropriate anesthetic  monitors were attached, and general endotracheal anesthesia induced with  the patient in supine position, Foley catheter was inserted. Tourniquet  applied high to the operative thigh. Lateral  post and foot positioner  applied to the table, the lower extremity was then prepped and draped  in usual sterile fashion from the ankle to the tourniquet. Time-out procedure was performed. The limb was wrapped with an Esmarch bandage and the tourniquet inflated to 350 mmHg. We began the operation by making the anterior midline incision starting at handbreadth above the patella going over the patella 1 cm medial to and  4 cm distal to the tibial tubercle. Small bleeders in the skin and the  subcutaneous tissue identified and cauterized. Transverse retinaculum was incised and reflected medially and a medial parapatellar arthrotomy was accomplished. the patella was everted and theprepatellar fat pad resected. Synovial fluid was somewhat bloody, with past history infection in the contralateral knee fluid was sent for Gram stain and culture. The superficial medial collateral  ligament was then elevated from anterior to posterior along the proximal  flare of the tibia and anterior half of the menisci resected. The knee was hyperflexed exposing bone on bone arthritis. Peripheral and notch osteophytes as well as the cruciate ligaments were then resected. We continued to  work our way around posteriorly along the proximal tibia, and externally  rotated the tibia subluxing it out from underneath the femur. A McHale  retractor was placed through the notch and a lateral Hohmann retractor  placed, and we then drilled through the proximal tibia in line with the  axis of the tibia followed by an intramedullary guide rod and 2-degree  posterior slope cutting guide. The tibial cutting guide was pinned into place  allowing resection of 7 mm of bone medially and about 12 mm of bone  laterally because of her varus deformity. Satisfied with the tibial resection, we then  entered the distal femur 2 mm anterior to the PCL origin with the  intramedullary guide rod and applied the distal femoral cutting guide  set at 68mm,  with 5 degrees of valgus. This was pinned along the  epicondylar axis. At this point, the distal femoral cut was accomplished without difficulty. We then sized for a #2.5 femoral component and pinned the guide in 3 degrees of external rotation.The chamfer cutting guide was pinned into place. The anterior, posterior, and chamfer cuts were accomplished without difficulty followed by  the Sigma RP box cutting guide and the box cut. We also removed posterior osteophytes from the posterior femoral condyles. At this  time, the knee was brought into full extension. We checked our  extension and flexion gaps and found them symmetric at 69mm.  The patella thickness measured at 24 mm. We set the cutting guide at 15 and removed the posterior 9 mm  of the patella, sized for a 35 button and drilled the lollipop. The knee  was then once again hyperflexed exposing the proximal tibia. We sized for a #3 tibial base plate, applied the smokestack and the conical reamer followed by the the Delta fin keel punch. We then hammered into place the Sigma RP trial femoral component, inserted a 10-mm trial bearing, trial patellar button, and took the knee through range of motion from 0-130 degrees. No thumb pressure was required for patellar  tracking. At this point, all trial components were removed, a double batch of DePuy HV cement with 1500 mg of Zinacef was mixed and applied to all bony metallic mating surfaces except for the posterior condyles of the femur itself. In order, we  hammered into place the tibial tray and removed excess cement, the femoral component and removed excess cement, a 10-mm Sigma RP bearing  was inserted, and the knee brought to full extension with compression.  The patellar button was clamped into place, and excess cement  removed. While the cement cured the wound was irrigated out with normal saline solution pulse lavage, and medium Hemovac drains were placed from an anterolateral  approach. Ligament  stability and patellar tracking were checked and found to be excellent. The parapatellar arthrotomy was closed with  running #1 Vicryl suture. The subcutaneous tissue with 0 and 2-0 undyed  Vicryl suture, and the skin with skin staples. A dressing of Xeroform,  4 x 4, dressing sponges, Webril, and Ace wrap applied. The patient  awakened, extubated, and taken to recovery room without difficulty.   Kerin Salen 08/12/2013, 8:48 AM

## 2013-08-12 NOTE — Evaluation (Signed)
Physical Therapy Evaluation Patient Details Name: Julie Ross MRN: [id number] DOB: 1960-07-15 Today's Date: 08/12/2013 Time: 5361-4431 PT Time Calculation (min): 23 min  PT Assessment / Plan / Recommendation History of Present Illness  s/p RTKA  Clinical Impression  Pt is s/p TKA resulting in the deficits listed below (see PT Problem List).  Pt will benefit from skilled PT to increase their independence and safety with mobility to allow discharge to the venue listed below.      PT Assessment  Patient needs continued PT services    Follow Up Recommendations  SNF    Does the patient have the potential to tolerate intense rehabilitation      Barriers to Discharge        Equipment Recommendations  Rolling walker with 5" wheels;3in1 (PT)    Recommendations for Other Services OT consult   Frequency 7X/week    Precautions / Restrictions Precautions Precautions: Knee Restrictions Weight Bearing Restrictions: Yes RLE Weight Bearing: Weight bearing as tolerated   Pertinent Vitals/Pain 10+/10 R knee; RN provided medication to assist with pain control patient repositioned for comfort and optimal knee ext      Mobility  Bed Mobility Overal bed mobility: Needs Assistance Bed Mobility: Supine to Sit Supine to sit: Min assist General bed mobility comments: Min assist for RLE support coming off of bed Transfers Overall transfer level: Needs assistance Equipment used: Rolling walker (2 wheeled) Transfers: Sit to/from Stand Sit to Stand: Min assist General transfer comment: Cues fro technqiue and hand placement Ambulation/Gait Ambulation/Gait assistance: Min assist;+2 safety/equipment Ambulation Distance (Feet): 3 Feet Assistive device: Rolling walker (2 wheeled) Gait Pattern/deviations: Step-to pattern General Gait Details: Cues for gait sequence and to activate R quad for stance stability    Exercises     PT Diagnosis: Difficulty walking;Acute pain  PT Problem List:  Decreased strength;Decreased range of motion;Decreased activity tolerance;Decreased balance;Decreased mobility;Decreased knowledge of use of DME;Pain;Decreased knowledge of precautions PT Treatment Interventions: DME instruction;Gait training;Stair training;Functional mobility training;Therapeutic activities;Therapeutic exercise;Patient/family education     PT Goals(Current goals can be found in the care plan section) Acute Rehab PT Goals Patient Stated Goal: walk without pain PT Goal Formulation: With patient Time For Goal Achievement: 08/19/13 Potential to Achieve Goals: Good  Visit Information  Last PT Received On: 08/12/13 Assistance Needed: +1 History of Present Illness: s/p RTKA       Prior Functioning  Home Living Family/patient expects to be discharged to:: Skilled nursing facility Living Arrangements: Spouse/significant other Additional Comments: spouse works during the day Prior Function Level of Independence: Needs assistance Gait / Transfers Assistance Needed: uses RW Comments: pt uses RW Communication Communication: No difficulties Dominant Hand: Right    Cognition  Cognition Arousal/Alertness: Awake/alert Behavior During Therapy: WFL for tasks assessed/performed Overall Cognitive Status: Within Functional Limits for tasks assessed    Extremity/Trunk Assessment Upper Extremity Assessment Upper Extremity Assessment: Defer to OT evaluation;Overall WFL for tasks assessed Lower Extremity Assessment Lower Extremity Assessment: RLE deficits/detail RLE Deficits / Details: Noted decr knee control against gravity; decr AROM and strength, limited by pain   Balance    End of Session PT - End of Session Activity Tolerance: Patient tolerated treatment well Patient left: in chair;with call bell/phone within reach Nurse Communication: Patient requests pain meds;Mobility status  GP     Roney Marion [facility] [place], [place]  08/12/2013, 4:21 PM

## 2013-08-12 NOTE — Anesthesia Postprocedure Evaluation (Signed)
  Anesthesia Post-op Note  Patient: Julie Ross  Procedure(s) Performed: Procedure(s): RIGHT TOTAL KNEE ARTHROPLASTY (Right)  Patient Location: PACU  Anesthesia Type:General and GA combined with regional for post-op pain  Level of Consciousness: awake  Airway and Oxygen Therapy: Patient Spontanous Breathing  Post-op Pain: mild  Post-op Assessment: Post-op Vital signs reviewed, Patient's Cardiovascular Status Stable, Respiratory Function Stable, Patent Airway, No signs of Nausea or vomiting and Pain level controlled  Post-op Vital Signs: Reviewed and stable  Complications: No apparent anesthesia complications

## 2013-08-12 NOTE — Care Management Note (Signed)
CARE MANAGEMENT NOTE 08/12/2013  Patient:  Julie Ross, Julie Ross   Account Number:  [id number]  Date Initiated:  08/12/2013  Documentation initiated by:  Ricki Miller  Subjective/Objective Assessment:   53 yr old female s/p right total knee arthroplasty.     Action/Plan:   Patient is for shortterm rehab at [facility]. Social worker notified. CM will follow  PT/OT eval   Anticipated DC Date:     Anticipated DC Plan:           Choice offered to / List presented to:             Status of service:  In process, will continue to follow

## 2013-08-12 NOTE — Progress Notes (Signed)
Orthopedic Tech Progress Note Patient Details:  [name] [birth year] [id number]  Patient ID: Julie Ross, female   DOB: [birth year], 53 y.o.   MRN: [id number] Placed pt's rle in cpm @ 0-50 degrees as pt tolerates @ 2020; rn notified  Hildred Priest 08/12/2013, 8:20 PM

## 2013-08-12 NOTE — OR Nursing (Signed)
Eustace Moore from Microbiology called results of stat gram stain to OR 4 at 0855 on 08-12-13, "monos, no organisms."

## 2013-08-12 NOTE — Plan of Care (Signed)
Problem: Consults Goal: Diagnosis- Total Joint Replacement Primary Total Knee Right     

## 2013-08-13 ENCOUNTER — Encounter (HOSPITAL_COMMUNITY): Payer: Self-pay | Admitting: Orthopedic Surgery

## 2013-08-13 DIAGNOSIS — M79609 Pain in unspecified limb: Secondary | ICD-10-CM

## 2013-08-13 LAB — CBC
HCT: 29.9 % — ABNORMAL LOW (ref 36.0–46.0)
Hemoglobin: 10.1 g/dL — ABNORMAL LOW (ref 12.0–15.0)
MCH: 28.5 pg (ref 26.0–34.0)
MCHC: 33.8 g/dL (ref 30.0–36.0)
MCV: 84.2 fL (ref 78.0–100.0)
Platelets: 225 10*3/uL (ref 150–400)
RBC: 3.55 MIL/uL — ABNORMAL LOW (ref 3.87–5.11)
RDW: 13.8 % (ref 11.5–15.5)
WBC: 11.5 10*3/uL — ABNORMAL HIGH (ref 4.0–10.5)

## 2013-08-13 NOTE — Clinical Social Work Placement (Signed)
Clinical Social Work Department  CLINICAL SOCIAL WORK PLACEMENT NOTE    Patient: GENEVIE ELMAN  Account Number: [id number]   Admit date: 08/12/13 Clinical Social Worker: Rhea Pink LCSWA Date/time: 08/13/2013 11:30 AM  Clinical Social Work is seeking post-discharge placement for this patient at the following level of care: SKILLED NURSING (*CSW will update this form in Epic as items are completed)  08/13/2013 Patient/family provided with [place] Department of Clinical Social Work's list of facilities offering this level of care within the geographic area requested by the patient (or if unable, by the patient's family).  08/13/2013 Patient/family informed of their freedom to choose among providers that offer the needed level of care, that participate in Medicare, Medicaid or managed care program needed by the patient, have an available bed and are willing to accept the patient.  08/13/2013  Patient/family informed of [facility]' ownership interest in [facility], as well as of the fact that they are under no obligation to receive care at this facility.  PASARR submitted to EDS on Preexis[employer]ng PASARR number received from EDS on   FL2 transmitted to all facilities in geographic area requested by pt/family on 08/13/2013 FL2 transmitted to all facilities within larger geographic area on  Patient informed that his/her managed care company has contracts with or will negotiate with certain facilities, including the following:  Patient/family informed of bed offers received:  Patient chooses bed at  Physician recommends and patient chooses bed at  Patient to be transferred to on  Patient to be transferred to facility by  The following physician request were entered in Epic:  Additional Comments:

## 2013-08-13 NOTE — Clinical Social Work Psychosocial (Signed)
Clinical Social Work Department  BRIEF PSYCHOSOCIAL ASSESSMENT  04/21/2013  Patient: Julie Ross,Julie Ross Account Number: [id number] Admit date: 08/12/13 Clinical Social Worker: MORGAN,BAILEY, LCSWA Date/Time: 08/13/2013 3:44 PM Referred by: Physician Date Referred: 06/11/13 Referred for   SNF Placement   Other Referral:  Interview type: Patient  Other interview type:  PSYCHOSOCIAL DATA  Living Status: HUSBAND  Admitted from facility:  Level of care:  Primary support name: Julie Ross  Primary support relationship to patient: SPOUSE  Degree of support available:  Very supportive.   CURRENT CONCERNS  Other Concerns:  SOCIAL WORK ASSESSMENT / PLAN  Clinical Social Worker (CSW) met with patient to discuss SNF placement. Patient has been to [facility] in the past and would like to return to [facility] to continue her therapy.  Patient reports she is agreeable to SNF search. Her preference is [facility].   Assessment/plan status:  Other assessment/ plan:  Information/referral to community resources:  CSW gave patient SNF list.   PATIENT'S/FAMILY'S RESPONSE TO PLAN OF CARE:  Patient appreciative of CSW visit. Patient had no trepidations about returning to SNF for therapy.  CSW sent clinicals to [facility]. They are familiar with the patient  Julie Ross, MSW, LCSWA [phone]   

## 2013-08-13 NOTE — Progress Notes (Signed)
*  Preliminary Results* Right lower extremity venous duplex completed. Study was limited due to surgical bandages. Visualized veins of the right lower extremity is negative for deep vein thrombosis. There is no evidence of right Baker's cyst.  08/13/2013 12:57 PM  Maudry Mayhew, RVT, RDCS, RDMS

## 2013-08-13 NOTE — Progress Notes (Signed)
  Physical Therapy Treatment Patient Details Name: Julie Ross MRN: [id number] DOB: 08-29-1960 Today's Date: 08/13/2013 Time: 0354-6568 PT Time Calculation (min): 24 min  PT Assessment / Plan / Recommendation  History of Present Illness s/p RTKA   PT Comments   Continued significant pain, but pt agreeable to getting up with persuasion; Decr tol of WBing RLE, pt essentially step with what looks like TDWBing   Continue to recommend postacute rehab  Follow Up Recommendations  SNF     Does the patient have the potential to tolerate intense rehabilitation     Barriers to Discharge        Equipment Recommendations  Rolling walker with 5" wheels;3in1 (PT)    Recommendations for Other Services OT consult  Frequency 7X/week   Progress towards PT Goals Progress towards PT goals: Progressing toward goals  Plan Current plan remains appropriate    Precautions / Restrictions Precautions Precautions: Knee Restrictions Weight Bearing Restrictions: Yes RLE Weight Bearing: Weight bearing as tolerated   Pertinent Vitals/Pain 10+/10 RLE patient repositioned for comfort and as optimal knee extension as possible     Mobility  Bed Mobility Overal bed mobility: Needs Assistance Bed Mobility: Supine to Sit Supine to sit: Min assist General bed mobility comments: Min assist for RLE support coming off of bed and to scoot hip forward Transfers Overall transfer level: Needs assistance Equipment used: Rolling walker (2 wheeled) Transfers: Sit to/from [facility] Sit to Stand: Min assist Stand pivot transfers: Min assist General transfer comment: vc's for correct hand placement and sliding RLE forward before sitting.     Exercises Total Joint Exercises Ankle Circles/Pumps: AROM;Both;10 reps;Seated Quad Sets: AROM;Right;5 reps;Seated   PT Diagnosis:    PT Problem List:   PT Treatment Interventions:     PT Goals (current goals can now be found in the care plan  section) Acute Rehab PT Goals PT Goal Formulation: With patient Time For Goal Achievement: 08/19/13 Potential to Achieve Goals: Good  Visit Information  Last PT Received On: 08/13/13 Assistance Needed: +2 PT/OT/SLP Co-Evaluation/Treatment: Yes Reason for Co-Treatment: Other (comment) (Pt tolerance for activity due to pain) PT goals addressed during session: Mobility/safety with mobility;Proper use of DME OT goals addressed during session: ADL's and self-care History of Present Illness: s/p RTKA    Subjective Data  Subjective: Pt states she will do her best to get out of the bed.   Cognition  Cognition Arousal/Alertness: Awake/alert Behavior During Therapy: WFL for tasks assessed/performed Overall Cognitive Status: Within Functional Limits for tasks assessed    Balance     End of Session PT - End of Session Equipment Utilized During Treatment: Gait belt Activity Tolerance: Patient limited by pain Patient left: in chair;with call bell/phone within reach Nurse Communication: Patient requests pain meds   GP     [place], [place], [place] With Candie Mile, PT 08/13/2013, 4:19 PM

## 2013-08-13 NOTE — Progress Notes (Signed)
PT Cancellation Note  Patient Details Name: Julie Ross MRN: [id number] DOB: 07/18/1960   Cancelled Treatment:    Reason Eval/Treat Not Completed: Patient declined, no reason specified  Roney Marion, [place]'    Roney Marion [facility] 08/13/2013, 12:23 PM

## 2013-08-13 NOTE — Progress Notes (Signed)
Patient ID: AOIFE BOLD, female   DOB: September 12, 1960, 53 y.o.   MRN: [id number] PATIENT ID: ZEHAVA TURSKI  MRN: [id number]  DOB/AGE:  11-24-1960 / 53 y.o.  1 Day Post-Op Procedure(s) (LRB): RIGHT TOTAL KNEE ARTHROPLASTY (Right)    PROGRESS NOTE Subjective: Patient is alert, oriented, no Nausea, no Vomiting, yes passing gas, no Bowel Movement. Taking PO sips. Denies SOB, Chest or 1+ Calf Pain. Using Incentive Spirometer, PAS in place. Ambulate WBAT, CPM 0-60 Patient reports pain as 7 on 0-10 scale  .    Objective: Vital signs in last 24 hours: Filed Vitals:   08/12/13 1200 08/12/13 2101 08/13/13 0241 08/13/13 0540  BP: 125/76 136/66 136/66 126/68  Pulse: 78 72 77 82  Temp: 97.3 F (36.3 C) 98.3 F (36.8 C) 98.3 F (36.8 C) 98 F (36.7 C)  TempSrc: Oral Oral Oral Oral  Resp:  18 18 18   SpO2: 100% 100% 97% 98%      Intake/Output from previous day: I/O last 3 completed shifts: In: 2312.5 [I.V.:2312.5] Out: 650 [Urine:450; Drains:200]   Intake/Output this shift:     LABORATORY DATA:  Recent Labs  08/13/13 0440  WBC 11.5*  HGB 10.1*  HCT 29.9*  PLT 225    Examination: Neurologically intact ABD soft Neurovascular intact Sensation intact distally Intact pulses distally Dorsiflexion/Plantar flexion intact Incision: scant drainage No cellulitis present Compartment soft} Posterior aspect of her right calf is one plus tender, dorsiflexion and plantarflexion are intact,  Normal pulses, minimal swelling.  Patient denies history of DVT. Assessment:   1 Day Post-Op Procedure(s) (LRB): RIGHT TOTAL KNEE ARTHROPLASTY (Right) ADDITIONAL DIAGNOSIS:  history of cancer with radiation therapy, history of infected left total knee, hypothyroidism, esophageal reflux, depression, chronic pain on pain management  Plan: PT/OT WBAT, CPM 5/hrs day until ROM 0-90 degrees, then D/C CPM,Will obtain Doppler to make sure that she does not have a DVT DVT Prophylaxis:  SCDx72hrs, ASA 325 mg BID  x 2 weeks DISCHARGE PLAN: Skilled Nursing Facility/Rehab, [facility] DISCHARGE NEEDS: HHPT, HHRN, CPM, Walker and 3-in-1 comode seat     Aaralyn Kil J 08/13/2013, 7:06 AM

## 2013-08-13 NOTE — Progress Notes (Signed)
Orthopedic Tech Progress Note Patient Details:  Julie Ross 11/12/1960 [id number] On cpm at 8:10 pm RLE 0-50 Patient ID: Julie Ross, female   DOB: Feb 09, 1961, 53 y.o.   MRN: [id number]   Julie Ross 08/13/2013, 8:12 PM

## 2013-08-13 NOTE — Evaluation (Signed)
Occupational Therapy Evaluation  Patient Details Name: Julie Ross MRN: [id number] DOB: Sep 27, 1960 Today's Date: 08/13/2013 Time: 2952-8413 OT Time Calculation (min): 10 min  OT Assessment / Plan / Recommendation History of present illness s/p RTKA   Clinical Impression   This 53 yo Female admitted and underwent above presents with decreased strength, decreased ROM, decreased activity tolerance, and increased pain. Pt plans to discharge to SNF and will have OT needs met at this venue. Acute OT will sign off at this time.     OT Assessment  All further OT needs can be met in the next venue of care    Follow Up Recommendations  SNF                Precautions / Restrictions Precautions Precautions: Knee Restrictions Weight Bearing Restrictions: No RLE Weight Bearing: Weight bearing as tolerated   Pertinent Vitals/Pain 10/10 pain; repositioned pt    ADL  Eating/Feeding: Independent Where Assessed - Eating/Feeding: Chair Grooming: Set up Where Assessed - Grooming: Supported sitting Upper Body Bathing: Set up Where Assessed - Upper Body Bathing: Supported sitting Lower Body Bathing: Maximal assistance Where Assessed - Lower Body Bathing: Supported sit to stand Upper Body Dressing: Set up Where Assessed - Upper Body Dressing: Supported sitting Lower Body Dressing: Maximal assistance Where Assessed - Lower Body Dressing: Supported sit to Lobbyist: Minimal Print production planner Method: Sit to stand;Stand pivot Science writer:  (raised bed > raised seat of recliner) Toileting - Clothing Manipulation and Hygiene: Moderate assistance Where Assessed - Toileting Clothing Manipulation and Hygiene:  (sit to stand from raised bed) Transfers/Ambulation Related to ADLs: Pt c/o severe pain in RLE exacerbated during sit>stand    OT Diagnosis: Generalized weakness;Acute pain  OT Problem List: Decreased strength;Decreased range of motion;Decreased activity  tolerance;Impaired balance (sitting and/or standing);Pain;Decreased knowledge of use of DME or AE       Visit Information  Last OT Received On: 08/13/13 Assistance Needed: +1 PT/OT/SLP Co-Evaluation/Treatment: Yes Reason for Co-Treatment: Other (comment) (Pt's tolerance of activity due to pain) OT goals addressed during session: ADL's and self-care History of Present Illness: s/p RTKA       Prior [place] expects to be discharged to:: Skilled nursing facility Living Arrangements: Spouse/significant other Additional Comments: spouse works during the day Prior Function Level of Independence: Needs assistance Communication Communication: No difficulties Dominant Hand: Right         Vision/Perception Vision - History Patient Visual Report: No change from baseline   Cognition  Cognition Arousal/Alertness: Awake/alert Behavior During Therapy: WFL for tasks assessed/performed Overall Cognitive Status: Within Functional Limits for tasks assessed    Extremity/Trunk Assessment Upper Extremity Assessment Upper Extremity Assessment: Generalized weakness     Mobility Transfers Overall transfer level: Needs assistance Equipment used: Rolling walker (2 wheeled) Transfers: Sit to/from [facility] Sit to Stand: Min assist Stand pivot transfers: Min assist General transfer comment: vc's for correct hand placement and sliding RLE forward before sitting. Both bed and recliner surfaces were raised for transfer completion.           End of Session OT - End of Session Equipment Utilized During Treatment: Gait belt;Rolling walker Activity Tolerance: Patient limited by pain Patient left: in chair;with call bell/phone within reach;Other (comment) (with PT)       Julie Ross [phone] 08/13/2013, 3:02 PM

## 2013-08-14 LAB — CBC
HCT: 23.1 % — ABNORMAL LOW (ref 36.0–46.0)
Hemoglobin: 7.8 g/dL — ABNORMAL LOW (ref 12.0–15.0)
MCH: 28.4 pg (ref 26.0–34.0)
MCHC: 33.8 g/dL (ref 30.0–36.0)
MCV: 84 fL (ref 78.0–100.0)
Platelets: 246 10*3/uL (ref 150–400)
RBC: 2.75 MIL/uL — ABNORMAL LOW (ref 3.87–5.11)
RDW: 14.1 % (ref 11.5–15.5)
WBC: 12.6 10*3/uL — ABNORMAL HIGH (ref 4.0–10.5)

## 2013-08-14 NOTE — Progress Notes (Signed)
Physical Therapy Treatment Note  Seen for PT session, orthostatics as follows:   08/14/13 1108 08/14/13 1112 08/14/13 1120  Vital Signs  Pulse Rate 81 86 --   Pulse Rate Source Dinamap Dinamap Dinamap  BP ! 126/54 mmHg ! 124/49 mmHg ! 116/101 mmHg (Question validity of this reading)  BP Location Right arm Right arm Right arm  BP Method Automatic Automatic Automatic  Patient Position, if appropriate Lying Sitting (reported nausea) Standing (nausea)     08/14/13 1121  Vital Signs  Pulse Rate 92  Pulse Rate Source Dinamap  BP ! 99/51 mmHg  BP Location Right arm   BP Method Automatic  Patient Position, if appropriate Standing   RN is aware.  Full note to follow. [place], [place] With Candie Mile

## 2013-08-14 NOTE — Progress Notes (Signed)
Physical Therapy Treatment Patient Details Name: Julie Ross MRN: [id number] DOB: 23-Sep-1960 Today's Date: 08/14/2013 Time: 1105-1130 PT Time Calculation (min): 25 min  PT Assessment / Plan / Recommendation  History of Present Illness s/p RTKA   PT Comments   Pain continuing to limit activity tolerance; Please see also other PT note of this date for vital signs in lying, sitting and standing; Pt did have a systolic BP drop of 27 mmHG from lying to sitting, but she was able to tolerate standing for approximately 5 min -- reported nausea, but no dizziness or lightheadedness; RN notified  Follow Up Recommendations  SNF     Does the patient have the potential to tolerate intense rehabilitation     Barriers to Discharge        Equipment Recommendations  Rolling walker with 5" wheels;3in1 (PT)    Recommendations for Other Services OT consult  Frequency 7X/week   Progress towards PT Goals Progress towards PT goals: Progressing toward goals  Plan Current plan remains appropriate    Precautions / Restrictions Precautions Precautions: Knee Restrictions Weight Bearing Restrictions: Yes RLE Weight Bearing: Weight bearing as tolerated   Pertinent Vitals/Pain 10/10 R knee RN aware;  patient repositioned for comfort and as optimal knee ext as possible    Mobility  Bed Mobility Overal bed mobility: Needs Assistance Bed Mobility: Supine to Sit Supine to sit: Min assist General bed mobility comments: Min assist for RLE support coming off of bed, pt able to scoot hips forward with verbal cues and extra time Transfers Overall transfer level: Needs assistance Equipment used: Rolling walker (2 wheeled) Transfers: Sit to/from Stand Sit to Stand: Mod assist General transfer comment: Pt required moderate assistance and verbal cues for foot placement    Exercises Total Joint Exercises Ankle Circles/Pumps: AROM;Both;20 reps   PT Diagnosis:    PT Problem List:   PT Treatment  Interventions:     PT Goals (current goals can now be found in the care plan section) Acute Rehab PT Goals PT Goal Formulation: With patient Time For Goal Achievement: 08/19/13 Potential to Achieve Goals: Good  Visit Information  Last PT Received On: 08/14/13 Assistance Needed: +2 History of Present Illness: s/p RTKA    Subjective Data  Subjective: Pt reports she is doing a little better today   Cognition  Cognition Arousal/Alertness: Awake/alert Behavior During Therapy: WFL for tasks assessed/performed Overall Cognitive Status: Within Functional Limits for tasks assessed    Balance     End of Session PT - End of Session Activity Tolerance: Patient limited by pain Patient left: in bed;with call bell/phone within reach CPM Right Knee CPM Right Knee: On Right Knee Flexion (Degrees): 50 Right Knee Extension (Degrees): 0   GP     Roney Marion [place], [place] [phone]  08/14/2013, 1:20 PM

## 2013-08-14 NOTE — Progress Notes (Signed)
Patient discharged to [facility], SNF. Report called to Shauna Hugh, RN at [facility]. IV was removed and dressing was changed. Patient left unit in a stable condition via EMS.

## 2013-08-14 NOTE — Discharge Summary (Signed)
Patient ID: Julie Ross MRN: [id number] DOB/AGE: 55-21-1962 55 y.o.  Admit date: 08/12/2013 Discharge date: 08/14/2013  Admission Diagnoses:  Active Problems:   Arthritis of right knee   Discharge Diagnoses:  Same  Past Medical History  Diagnosis Date  . Hypertension   . Hypothyroidism   . Neuromuscular disorder     MS TX WITH CYMBALTA   . Multiple sclerosis   . H/O colonoscopy   . H/O bone density study 03/2011  . Wears glasses   . Heart burn   . Depression   . Hx of radiation therapy 04/17/12- 06/04/12    left chest wall, axilla, L supraclavicular fossa 4500 cGy, left mastectomy scar/chest wall 5940 cGy  . H/O echocardiogram     last one 02/2013, due to chemotherapy  . Anxiety     uses xanax mainly for sleep  . Sleep apnea     MILD SLEEP APNEA, NO (Needed) MACHINE  6 YRS AGO [facility]PT [facility]   . GERD (gastroesophageal reflux disease)   . Breast cancer 11/2011    lul/ER+PR+, x1 lymph node   . Cancer     squamous cell skin cancer x7  . Arthritis     knees   . Anemia     h/o - ? bld. transfusion - more recent- ?2013    Surgeries: Procedure(s): RIGHT TOTAL KNEE ARTHROPLASTY on 08/12/2013   Consultants:    Discharged Condition: Improved  Hospital Course: Julie Ross is an 53 y.o. female who was admitted 08/12/2013 for operative treatment of<principal problem not specified>. Patient has severe unremitting pain that affects sleep, daily activities, and work/hobbies. After pre-op clearance the patient was taken to the operating room on 08/12/2013 and underwent  Procedure(s): RIGHT TOTAL KNEE ARTHROPLASTY.    Patient was given perioperative antibiotics: Anti-infectives   Start     Dose/Rate Route Frequency Ordered Stop   08/12/13 0820  cefUROXime (ZINACEF) injection  Status:  Discontinued       As needed 08/12/13 0820 08/12/13 0942   08/12/13 0600  ceFAZolin (ANCEF) IVPB 2 g/50 mL premix     2 g 100 mL/hr over 30 Minutes Intravenous On call to O.R. 08/11/13 1345  08/12/13 0758   08/12/13 0543  ceFAZolin (ANCEF) 2-3 GM-% IVPB SOLR    Comments:  Scronce, Trina   : cabinet override      08/12/13 0543 08/12/13 1759       Patient was given sequential compression devices, early ambulation, and chemoprophylaxis to prevent DVT.  Patient benefited maximally from hospital stay and there were no complications.    Recent vital signs: Patient Vitals for the past 24 hrs:  BP Temp Temp src Pulse Resp SpO2  08/14/13 0505 152/118 mmHg 98.2 F (36.8 C) Oral 77 20 90 %  08/14/13 0400 - - - - 20 -  08/14/13 0000 - - - - 20 -  08/13/13 2101 99/53 mmHg 97.8 F (36.6 C) Oral 75 18 100 %  08/13/13 2000 - - - - 20 99 %  08/13/13 0800 - - - - 18 96 %     Recent laboratory studies:  Recent Labs  08/13/13 0440 08/14/13 0424  WBC 11.5* 12.6*  HGB 10.1* 7.8*  HCT 29.9* 23.1*  PLT 225 246     Discharge Medications:     Medication List         ALPRAZolam 0.5 MG tablet  Commonly known as:  XANAX  Take 0.5 mg by mouth at bedtime.  amLODipine 10 MG tablet  Commonly known as:  NORVASC  Take 10 mg by mouth daily before breakfast.     aspirin EC 325 MG tablet  Take 1 tablet (325 mg total) by mouth 2 (two) times daily.     atenolol 25 MG tablet  Commonly known as:  TENORMIN  Take 0.5 tablets (12.5 mg total) by mouth daily before breakfast. For blood pressure     baclofen 10 MG tablet  Commonly known as:  LIORESAL  Take 10 mg by mouth 3 (three) times daily.     buPROPion 150 MG 24 hr tablet  Commonly known as:  WELLBUTRIN XL  Take 1 tablet (150 mg total) by mouth daily.     CALCIUM-MAGNESIUM PO  Take 1 tablet by mouth daily.     DAYQUIL PO  Take 30 mLs by mouth 2 (two) times daily as needed (congestion).     docusate sodium 50 MG capsule  Commonly known as:  COLACE  Take 100 mg by mouth at bedtime.     DULoxetine 60 MG capsule  Commonly known as:  CYMBALTA  Take 1 capsule (60 mg total) by mouth daily.     EAR WAX REMOVAL AID OT  Place  5 drops into the right ear daily as needed (excessive ear wax).     gabapentin 300 MG capsule  Commonly known as:  NEURONTIN  Take 300 mg by mouth 2 (two) times daily.     letrozole 2.5 MG tablet  Commonly known as:  FEMARA  Take 2.5 mg by mouth daily.     losartan-hydrochlorothiazide 100-25 MG per tablet  Commonly known as:  HYZAAR  Take 1 tablet by mouth daily before breakfast.     methocarbamol 500 MG tablet  Commonly known as:  ROBAXIN  Take 1 tablet (500 mg total) by mouth 2 (two) times daily with a meal.     multivitamins ther. w/minerals Tabs tablet  Take 1 tablet by mouth daily.     NYQUIL PO  Take 30 mLs by mouth at bedtime.     omeprazole 40 MG capsule  Commonly known as:  PRILOSEC  Take 40 mg by mouth daily.     oxyCODONE-acetaminophen 10-325 MG per tablet  Commonly known as:  PERCOCET  Take 1 tablet by mouth every 4 (four) hours as needed for pain.     PROBIOTIC DAILY PO  Take 1 capsule by mouth daily.     vitamin E 400 UNIT capsule  Take 800 Units by mouth daily.        Diagnostic Studies: Dg Chest 2 View  08/05/2013   CLINICAL DATA:  Left mastectomy.  Hypertension.  EXAM: CHEST  2 VIEW  COMPARISON:  None.  FINDINGS: Mediastinum and hilar structures normal. Lungs are clear. Stable borderline cardiomegaly, no pulmonary venous congestion. Left mastectomy. Degenerative changes thoracic spine.  IMPRESSION: 1.  No acute cardiopulmonary disease.  2.  Stable borderline cardiomegaly.  3. Left mastectomy.   Electronically Signed   By: Marcello Moores  Register   On: 08/05/2013 14:05    Disposition: 03-Skilled Nursing Facility      Discharge Orders   Future Appointments Provider Department Dept Phone   08/30/2013 11:00 AM Midge Minium, MD [facility]  [place] [phone]   12/27/2013 10:00 AM [facility] [phone]   12/27/2013 10:30 AM Deatra Robinson, MD [facility] [phone]   Future Orders Complete By Expires  Call MD / Call 911  As directed    Comments:     If you experience chest pain or shortness of breath, CALL 911 and be transported to the hospital emergency room.  If you develope a fever above 101 F, pus (white drainage) or increased drainage or redness at the wound, or calf pain, call your surgeon's office.   Change dressing  As directed    Comments:     Change dressing on 5, then change the dressing daily with sterile 4 x 4 inch gauze dressing and apply TED hose.  You may clean the incision with alcohol prior to redressing.   Constipation Prevention  As directed    Comments:     Drink plenty of fluids.  Prune juice may be helpful.  You may use a stool softener, such as Colace (over the counter) 100 mg twice a day.  Use MiraLax (over the counter) for constipation as needed.   CPM  As directed    Comments:     Continuous passive motion machine (CPM):      Use the CPM from 0 to 60  for 5 hours per day.      You may increase by 10 degrees per day.  You may break it up into 2 or 3 sessions per day.      Use CPM for 2 weeks or until you are told to stop.   Diet - low sodium heart healthy  As directed    Discharge instructions  As directed    Comments:     Follow up in office with Dr. Mayer Camel in 2 weeks.   Driving restrictions  As directed    Comments:     No driving for 2 weeks   Increase activity slowly as tolerated  As directed    Patient may shower  As directed    Comments:     You may shower without a dressing once there is no drainage.  Do not wash over the wound.  If drainage remains, cover wound with plastic wrap and then shower.      Follow-up Information   Follow up with Kerin Salen, MD In 2 weeks.   Specialty:  Orthopedic Surgery   Contact information:   [place] [address] [phone]        Signed: Hardin Negus Natahsa Marian R 08/14/2013, 7:45 AM

## 2013-08-14 NOTE — Progress Notes (Signed)
PATIENT ID: Julie Ross  MRN: [id number]  DOB/AGE:  1960-07-20 / 53 y.o.  2 Days Post-Op Procedure(s) (LRB): RIGHT TOTAL KNEE ARTHROPLASTY (Right)    PROGRESS NOTE Subjective: Patient is alert, oriented, no Nausea, no Vomiting, yes passing gas, no Bowel Movement. Taking PO well. Denies SOB, Chest or Calf Pain. Using Incentive Spirometer, PAS in place. Ambulate WBAT, CPM 0-60 Patient reports pain as 9 on 0-10 scale while in CPM, 5/10 consitently yesterday per nursing .    Objective: Vital signs in last 24 hours: Filed Vitals:   08/13/13 2101 08/14/13 0000 08/14/13 0400 08/14/13 0505  BP: 99/53   152/118  Pulse: 75   77  Temp: 97.8 F (36.6 C)   98.2 F (36.8 C)  TempSrc: Oral   Oral  Resp:  20 20 20   SpO2: 100%   90%      Intake/Output from previous day: I/O last 3 completed shifts: In: 1770.4 [P.O.:720; I.V.:1050.4] Out: 576 [Urine:451; Drains:125]   Intake/Output this shift:     LABORATORY DATA:  Recent Labs  08/13/13 0440 08/14/13 0424  WBC 11.5* 12.6*  HGB 10.1* 7.8*  HCT 29.9* 23.1*  PLT 225 246    Examination: Neurologically intact Neurovascular intact Sensation intact distally Intact pulses distally Dorsiflexion/Plantar flexion intact Incision: dressing C/D/I No cellulitis present Compartment soft}  Assessment:   2 Days Post-Op Procedure(s) (LRB): RIGHT TOTAL KNEE ARTHROPLASTY (Right) ADDITIONAL DIAGNOSIS:  history of cancer with radiation therapy, history of infected left total knee, hypothyroidism, esophageal reflux, depression, chronic pain on pain management  Venus doppler U/S negative  Plan: PT/OT WBAT, CPM 5/hrs day until ROM 0-90 degrees, then D/C CPM DVT Prophylaxis:  SCDx72hrs, ASA 325 mg BID x 2 weeks DISCHARGE PLAN: Skilled Nursing Facility/Rehab, [facility] if bed available. DISCHARGE NEEDS: HHPT, HHRN, CPM, Walker and 3-in-1 comode seat     Candelario Steppe R 08/14/2013, 7:38 AM

## 2013-08-15 LAB — BODY FLUID CULTURE: Culture: NO GROWTH

## 2013-08-17 LAB — ANAEROBIC CULTURE

## 2013-08-19 ENCOUNTER — Non-Acute Institutional Stay (SKILLED_NURSING_FACILITY): Payer: 59 | Admitting: Adult Health

## 2013-08-19 DIAGNOSIS — F3289 Other specified depressive episodes: Secondary | ICD-10-CM

## 2013-08-19 DIAGNOSIS — K219 Gastro-esophageal reflux disease without esophagitis: Secondary | ICD-10-CM

## 2013-08-19 DIAGNOSIS — M1711 Unilateral primary osteoarthritis, right knee: Secondary | ICD-10-CM

## 2013-08-19 DIAGNOSIS — F329 Major depressive disorder, single episode, unspecified: Secondary | ICD-10-CM

## 2013-08-19 DIAGNOSIS — IMO0002 Reserved for concepts with insufficient information to code with codable children: Secondary | ICD-10-CM

## 2013-08-19 DIAGNOSIS — F32A Depression, unspecified: Secondary | ICD-10-CM

## 2013-08-19 DIAGNOSIS — I10 Essential (primary) hypertension: Secondary | ICD-10-CM

## 2013-08-19 DIAGNOSIS — G35 Multiple sclerosis: Secondary | ICD-10-CM

## 2013-08-19 DIAGNOSIS — F411 Generalized anxiety disorder: Secondary | ICD-10-CM

## 2013-08-19 DIAGNOSIS — M1710 Unilateral primary osteoarthritis, unspecified knee: Secondary | ICD-10-CM

## 2013-08-19 DIAGNOSIS — G35D Multiple sclerosis, unspecified: Secondary | ICD-10-CM

## 2013-08-19 DIAGNOSIS — F419 Anxiety disorder, unspecified: Secondary | ICD-10-CM

## 2013-08-19 DIAGNOSIS — K5900 Constipation, unspecified: Secondary | ICD-10-CM

## 2013-08-23 ENCOUNTER — Encounter: Payer: Self-pay | Admitting: Internal Medicine

## 2013-08-23 ENCOUNTER — Non-Acute Institutional Stay (SKILLED_NURSING_FACILITY): Payer: 59 | Admitting: Internal Medicine

## 2013-08-23 DIAGNOSIS — F3289 Other specified depressive episodes: Secondary | ICD-10-CM | POA: Diagnosis not present

## 2013-08-23 DIAGNOSIS — M79604 Pain in right leg: Secondary | ICD-10-CM | POA: Insufficient documentation

## 2013-08-23 DIAGNOSIS — C50419 Malignant neoplasm of upper-outer quadrant of unspecified female breast: Secondary | ICD-10-CM | POA: Diagnosis not present

## 2013-08-23 DIAGNOSIS — Z96659 Presence of unspecified artificial knee joint: Secondary | ICD-10-CM

## 2013-08-23 DIAGNOSIS — F32A Depression, unspecified: Secondary | ICD-10-CM

## 2013-08-23 DIAGNOSIS — F411 Generalized anxiety disorder: Secondary | ICD-10-CM

## 2013-08-23 DIAGNOSIS — M79609 Pain in unspecified limb: Secondary | ICD-10-CM

## 2013-08-23 DIAGNOSIS — G35D Multiple sclerosis, unspecified: Secondary | ICD-10-CM | POA: Diagnosis not present

## 2013-08-23 DIAGNOSIS — D62 Acute posthemorrhagic anemia: Secondary | ICD-10-CM

## 2013-08-23 DIAGNOSIS — I10 Essential (primary) hypertension: Secondary | ICD-10-CM | POA: Diagnosis not present

## 2013-08-23 DIAGNOSIS — K219 Gastro-esophageal reflux disease without esophagitis: Secondary | ICD-10-CM

## 2013-08-23 DIAGNOSIS — F419 Anxiety disorder, unspecified: Secondary | ICD-10-CM

## 2013-08-23 DIAGNOSIS — G35 Multiple sclerosis: Secondary | ICD-10-CM

## 2013-08-23 DIAGNOSIS — F329 Major depressive disorder, single episode, unspecified: Secondary | ICD-10-CM

## 2013-08-23 NOTE — Assessment & Plan Note (Signed)
Continue pain meds, muscle relaxers and PT/OT

## 2013-08-23 NOTE — Progress Notes (Signed)
MRN: [id number] Name: Julie Ross  Sex: female Age: 53 y.o. DOB: [birth year]  [place] #: [place] Facility/Room: 207 Level Of Care: SNF Provider: Inocencio Homes D Emergency Contacts: Extended Emergency Contact Information Primary Emergency Contact: [facility] Address: [address]          [place], [place] [address] [place] [employer] Phone: [phone] Relation: Spouse Secondary Emergency Contact: Strathman,Morgan Address: [address], [place] [address] [place] [employer] Phone: [phone] Relation: Daughter  Allergies: Review of patient's allergies indicates no known allergies.  Chief Complaint  Patient presents with  . nursing home admission    HPI: Patient is 53 y.o. female who just underwent R knee arthroplasty and is admitted for OT/PT.  Past Medical History  Diagnosis Date  . Hypertension   . Hypothyroidism   . Neuromuscular disorder     MS TX WITH CYMBALTA   . Multiple sclerosis   . H/O colonoscopy   . H/O bone density study 03/2011  . Wears glasses   . Heart burn   . Depression   . Hx of radiation therapy 04/17/12- 06/04/12    left chest wall, axilla, L supraclavicular fossa 4500 cGy, left mastectomy scar/chest wall 5940 cGy  . H/O echocardiogram     last one 02/2013, due to chemotherapy  . Sleep apnea     MILD SLEEP APNEA, NO (Needed) MACHINE  6 YRS AGO HPT [facility]   . GERD (gastroesophageal reflux disease)   . Breast cancer 11/2011    lul/ER+PR+, x1 lymph node   . Cancer     squamous cell skin cancer x7  . Arthritis     knees   . Anemia     h/o - ? bld. transfusion - more recent- ?2013  . Anxiety     uses xanax mainly for sleep    Past Surgical History  Procedure Laterality Date  . Tonsillectomy    . Tumor removal      FROM PELVIS AGE 71  . Cesarean section      X2   . No past surgeries      BLADDER SLING  4-5 YRS AGO   . Knee arthroscopy      LT KNEE 04/2011  . Total knee arthroplasty   08/15/2011    Procedure: TOTAL KNEE ARTHROPLASTY; lft Surgeon: Alta Corning, MD;  Location: [facility];  Service: Orthopedics;  Laterality: Left;  RIGHT KNEE CORTIZONE INJECTION  . Mohs surgery      right face  . Knee arthroscopy  82    rt  . Mastectomy w/ sentinel node biopsy  12/19/2011    Procedure: MASTECTOMY WITH SENTINEL LYMPH NODE BIOPSY;  Surgeon: Haywood Lasso, MD;  Location: [facility];  Service: General;  Laterality: Left;  left breast and left axilla   . Portacath placement  01/16/2012    Procedure: INSERTION PORT-A-CATH;  Surgeon: Haywood Lasso, MD;  Location: [facility];  Service: General;  Laterality: Right;  [place] Cath Placement   . I&d knee with poly exchange  03/02/2012    Procedure: IRRIGATION AND DEBRIDEMENT KNEE WITH POLY EXCHANGE;  Surgeon: Alta Corning, MD;  Location: [facility];  Service: Orthopedics;  Laterality: Left;  I&D of total knee with Possible Poly Exchange   . Tee without cardioversion  03/06/2012    Procedure: TRANSESOPHAGEAL ECHOCARDIOGRAM (TEE);  Surgeon: Josue Hector, MD;  Location: [facility];  Service: Cardiovascular;  Laterality: N/A;  Rm. [id number]  . Port-a-cath removal  03/06/2012    Procedure: REMOVAL PORT-A-CATH;  Surgeon: Odis Hollingshead, MD;  Location: [facility];  Service: General;  Laterality: N/A;  . Portacath placement  05/01/2012    Procedure: INSERTION PORT-A-CATH;  Surgeon: Haywood Lasso, MD;  Location: [facility];  Service: General;  Laterality: Right;  right internal jugular port-a-cath insertion  . Total knee arthroplasty Left 08/18/2012    Procedure:  Irrigation and debridement of LEFT total knee;  Removal of total knee parts; Implant of spacers;  Surgeon: Kerin Salen, MD;  Location: [facility];  Service: Orthopedics;  Laterality: Left;  . Port-a-cath removal Right 08/22/2012    Procedure: REMOVAL PORT-A-CATH;  Surgeon: Edward Jolly, MD;  Location: [facility];  Service: General;  Laterality: Right;  . Joint replacement   Feb 2013    left knee- multiple surgeries   . Mastectomy    . Total knee revision Left 04/19/2013    Procedure: TOTAL KNEE REVISION AND REMOVAL CEMENT SPACER;  Surgeon: Kerin Salen, MD;  Location: [facility];  Service: Orthopedics;  Laterality: Left;  . Total knee arthroplasty Right 08/12/2013  . Total knee arthroplasty Right 08/12/2013    Procedure: RIGHT TOTAL KNEE ARTHROPLASTY;  Surgeon: Kerin Salen, MD;  Location: [facility];  Service: Orthopedics;  Laterality: Right;      Medication List       This list is accurate as of: 08/23/13  7:12 PM.  Always use your most recent med list.               ALPRAZolam 0.5 MG tablet  Commonly known as:  XANAX  Take 0.5 mg by mouth at bedtime.     amLODipine 10 MG tablet  Commonly known as:  NORVASC  Take 10 mg by mouth daily before breakfast.     aspirin EC 325 MG tablet  Take 1 tablet (325 mg total) by mouth 2 (two) times daily.     atenolol 25 MG tablet  Commonly known as:  TENORMIN  Take 0.5 tablets (12.5 mg total) by mouth daily before breakfast. For blood pressure     baclofen 10 MG tablet  Commonly known as:  LIORESAL  Take 10 mg by mouth 3 (three) times daily.     buPROPion 150 MG 24 hr tablet  Commonly known as:  WELLBUTRIN XL  Take 1 tablet (150 mg total) by mouth daily.     CALCIUM-MAGNESIUM PO  Take 1 tablet by mouth daily.     DAYQUIL PO  Take 30 mLs by mouth 2 (two) times daily as needed (congestion).     docusate sodium 50 MG capsule  Commonly known as:  COLACE  Take 100 mg by mouth at bedtime.     DULoxetine 60 MG capsule  Commonly known as:  CYMBALTA  Take 1 capsule (60 mg total) by mouth daily.     EAR WAX REMOVAL AID OT  Place 5 drops into the right ear daily as needed (excessive ear wax).     gabapentin 300 MG capsule  Commonly known as:  NEURONTIN  Take 300 mg by mouth 2 (two) times daily.     letrozole 2.5 MG tablet  Commonly known as:  FEMARA  Take 2.5 mg by mouth daily.      losartan-hydrochlorothiazide 100-25 MG per tablet  Commonly known as:  HYZAAR  Take 1 tablet by mouth daily before breakfast.  methocarbamol 500 MG tablet  Commonly known as:  ROBAXIN  Take 1 tablet (500 mg total) by mouth 2 (two) times daily with a meal.     multivitamins ther. w/minerals Tabs tablet  Take 1 tablet by mouth daily.     NYQUIL PO  Take 30 mLs by mouth at bedtime.     omeprazole 40 MG capsule  Commonly known as:  PRILOSEC  Take 40 mg by mouth daily.     oxyCODONE-acetaminophen 10-325 MG per tablet  Commonly known as:  PERCOCET  Take 1 tablet by mouth every 4 (four) hours as needed for pain.     PROBIOTIC DAILY PO  Take 1 capsule by mouth daily.     vitamin E 400 UNIT capsule  Take 800 Units by mouth daily.        No orders of the defined types were placed in this encounter.    Immunization History  Administered Date(s) Administered  . Influenza Split 04/11/2012  . Influenza,inj,Quad PF,36+ Mos 02/27/2013  . PPD Test 05/09/2012  . Pneumococcal Polysaccharide-23 06/20/2010  . Tdap 06/21/2011    History  Substance Use Topics  . Smoking status: Never Smoker   . Smokeless tobacco: Never Used  . Alcohol Use: No    Family history is noncontributory    Review of Systems  DATA OBTAINED: from patient;l only c/o is pain with weight bearing R leg, new GENERAL: Feels well no fevers, fatigue, appetite changes SKIN: No itching, rash or wounds EYES: No eye pain, redness, discharge EARS: No earache, tinnitus, change in hearing NOSE: No congestion, drainage or bleeding  MOUTH/THROAT: No mouth or tooth pain, No sore throat, No difficulty chewing or swallowing  RESPIRATORY: No cough, wheezing, SOB CARDIAC: No chest pain, palpitations, lower extremity edema  GI: No abdominal pain, No N/V/D or constipation, No heartburn or reflux  GU: No dysuria, frequency or urgency, or incontinence  MUSCULOSKELETAL: No unrelieved bone/joint pain NEUROLOGIC: No headache,  dizziness or focal weakness PSYCHIATRIC: No overt anxiety or sadness. Sleeps well. No behavior issue.   Filed Vitals:   08/23/13 1835  BP: 136/61  Pulse: 72  Temp: 98.5 F (36.9 C)  Resp: 20    Physical Exam  GENERAL APPEARANCE: Alert, conversant. Appropriately groomed. No acute distress.  SKIN: No diaphoresis rash, or wounds HEAD: Normocephalic, atraumatic  EYES: Conjunctiva/lids clear. Pupils round, reactive. EOMs intact.  EARS: External exam WNL, canals clear. Hearing grossly normal.  NOSE: No deformity or discharge.  MOUTH/THROAT: Lips w/o lesions. RESPIRATORY: Breathing is even, unlabored. Lung sounds are clear   CARDIOVASCULAR: Heart RRR no murmurs, rubs or gallops. Trace R peripheral edema with mild resolving bruising from surgery GASTROINTESTINAL: Abdomen is soft, non-tender, not distended w/ normal bowel sounds. No mass, ventral or inguinal hernia. No organomegally GENITOURINARY: Bladder non tender, not distended  MUSCULOSKELETAL: No abnormal joints or musculature NEUROLOGIC: Oriented X3. Cranial nerves 2-12 grossly intact. Moves all extremities no tremor. PSYCHIATRIC: Mood and affect appropriate to situation, no behavioral issues  Patient Active Problem List   Diagnosis Date Noted  . S/P total knee arthroplasty 08/23/2013  . Pain of right leg 08/23/2013  . Anxiety   . Arthritis of right knee 08/12/2013  . Constipation due to pain medication 07/19/2013  . Hearing loss secondary to cerumen impaction 07/19/2013  . Depression due to multiple sclerosis 06/10/2013  . Unspecified hypothyroidism 05/29/2013  . Esophageal reflux 05/29/2013  . Acquired absence of knee joint following removal of joint prosthesis with presence of antibiotic-impregnated cement  spacer Left 04/19/2013  . Hx of radiation therapy   . Infection of prosthetic left knee joint 09/12/2012  . Osteoarthritis of right knee 09/12/2012  . Essential hypertension, benign 06/11/2012  . Septic joint of left  knee joint 03/09/2012  . Hypokalemia 03/04/2012  . Hyponatremia 03/04/2012  . Bacteremia 03/04/2012  . Infected prosthetic knee joint 03/02/2012  . Seroma, postoperative 02/06/2012  . Dehydration 02/03/2012  . Multiple sclerosis 01/23/2012  . Cancer of upper-outer quadrant of female breast 11/22/2011  . Osteoarthritis of both knees 08/15/2011  . Osteoarthritis of left knee 08/15/2011    CBC    Component Value Date/Time   WBC 12.6* 08/14/2013 0424   WBC 6.5 06/25/2013 0958   RBC 2.75* 08/14/2013 0424   RBC 4.23 06/25/2013 0958   HGB 7.8* 08/14/2013 0424   HGB 12.3 06/25/2013 0958   HCT 23.1* 08/14/2013 0424   HCT 35.8 06/25/2013 0958   PLT 246 08/14/2013 0424   PLT 230 06/25/2013 0958   MCV 84.0 08/14/2013 0424   MCV 84.6 06/25/2013 0958   LYMPHSABS 1.7 08/05/2013 1213   LYMPHSABS 2.0 06/25/2013 0958   MONOABS 0.6 08/05/2013 1213   MONOABS 0.4 06/25/2013 0958   EOSABS 0.4 08/05/2013 1213   EOSABS 0.3 06/25/2013 0958   BASOSABS 0.0 08/05/2013 1213   BASOSABS 0.1 06/25/2013 0958    CMP     Component Value Date/Time   NA 138 08/05/2013 1213   NA 140 06/25/2013 0958   K 3.8 08/05/2013 1213   K 3.4* 06/25/2013 0958   CL 97 08/05/2013 1213   CL 100 11/30/2012 1029   CO2 24 08/05/2013 1213   CO2 24 06/25/2013 0958   GLUCOSE 143* 08/05/2013 1213   GLUCOSE 154* 06/25/2013 0958   GLUCOSE 91 11/30/2012 1029   BUN 22 08/05/2013 1213   BUN 22.9 06/25/2013 0958   CREATININE 1.16* 08/05/2013 1213   CREATININE 1.3* 06/25/2013 0958   CALCIUM 9.8 08/05/2013 1213   CALCIUM 9.6 06/25/2013 0958   PROT 7.2 06/25/2013 0958   PROT 7.0 09/01/2012 1420   ALBUMIN 3.9 06/25/2013 0958   ALBUMIN 3.1* 09/01/2012 1420   AST 17 06/25/2013 0958   AST 28 09/01/2012 1420   ALT 19 06/25/2013 0958   ALT <5 09/01/2012 1420   ALKPHOS 160* 06/25/2013 0958   ALKPHOS 400* 09/01/2012 1420   BILITOT 0.52 06/25/2013 0958   BILITOT 0.4 09/01/2012 1420   GFRNONAA 52* 08/05/2013 1213   GFRAA 61* 08/05/2013 1213    Assessment and Plan  S/P total knee  arthroplasty Continue pain meds, muscle relaxers and PT/OT  Pain of right leg Last several days with weight bearing;side of surgery;US for r/o DVT has been ordered  Essential hypertension, benign Continue present meds-tenormin, hyzaar and norvasc  Esophageal reflux Continue Omeprazole 40 mg daily  Multiple sclerosis On no MS specific meds sec to side-effects-is on baclofen and neurontin  Depression due to multiple sclerosis Wellbutrin recently added to cymbalta; will monitor progress  Cancer of upper-outer quadrant of female breast Chemo is done-was mot allowed to have surgery until it was finished;pt continues on Femara  Anxiety Continue nightly as pt has done prior    Hennie Duos, MD

## 2013-08-23 NOTE — Assessment & Plan Note (Signed)
Chemo is done-was mot allowed to have surgery until it was finished;pt continues on Femara

## 2013-08-23 NOTE — Assessment & Plan Note (Signed)
Continue Omeprazole 40mg daily 

## 2013-08-23 NOTE — Assessment & Plan Note (Signed)
Continue nightly as pt has done prior

## 2013-08-23 NOTE — Assessment & Plan Note (Signed)
Last several days with weight bearing;side of surgery;US for r/o DVT has been ordered

## 2013-08-23 NOTE — Assessment & Plan Note (Signed)
D/c Hb 7.8; will monitor for rise

## 2013-08-23 NOTE — Assessment & Plan Note (Signed)
Continue present meds-tenormin, hyzaar and norvasc

## 2013-08-23 NOTE — Assessment & Plan Note (Signed)
On no MS specific meds sec to side-effects-is on baclofen and neurontin

## 2013-08-23 NOTE — Assessment & Plan Note (Signed)
Wellbutrin recently added to cymbalta; will monitor progress

## 2013-08-26 ENCOUNTER — Ambulatory Visit (HOSPITAL_COMMUNITY)
Admission: RE | Admit: 2013-08-26 | Discharge: 2013-08-26 | Disposition: A | Discharge status: Home / Self Care | Payer: 59 | Source: Ambulatory Visit | Attending: Orthopedic Surgery | Admitting: Orthopedic Surgery

## 2013-08-26 ENCOUNTER — Other Ambulatory Visit (HOSPITAL_COMMUNITY): Payer: Self-pay | Admitting: Orthopedic Surgery

## 2013-08-26 DIAGNOSIS — M79609 Pain in unspecified limb: Secondary | ICD-10-CM

## 2013-08-26 DIAGNOSIS — M79604 Pain in right leg: Secondary | ICD-10-CM

## 2013-08-26 NOTE — Progress Notes (Signed)
*  Preliminary Results* Right lower extremity venous duplex completed. Right lower extremity is negative for deep vein thrombosis. There is no evidence of right Baker's cyst.  Attempted to call preliminary results to [phone], however there was no answer. The patient was discharged and can be reached by phone if necessary.  08/26/2013  Maudry Mayhew, RVT, RDCS, RDMS

## 2013-08-30 ENCOUNTER — Encounter: Payer: Self-pay | Admitting: Adult Health

## 2013-08-30 ENCOUNTER — Ambulatory Visit: Payer: 59 | Admitting: Family Medicine

## 2013-08-30 ENCOUNTER — Non-Acute Institutional Stay (SKILLED_NURSING_FACILITY): Payer: 59 | Admitting: Adult Health

## 2013-08-30 DIAGNOSIS — K5900 Constipation, unspecified: Secondary | ICD-10-CM | POA: Insufficient documentation

## 2013-08-30 DIAGNOSIS — IMO0002 Reserved for concepts with insufficient information to code with codable children: Secondary | ICD-10-CM

## 2013-08-30 DIAGNOSIS — F3289 Other specified depressive episodes: Secondary | ICD-10-CM

## 2013-08-30 DIAGNOSIS — F339 Major depressive disorder, recurrent, unspecified: Secondary | ICD-10-CM | POA: Insufficient documentation

## 2013-08-30 DIAGNOSIS — F329 Major depressive disorder, single episode, unspecified: Secondary | ICD-10-CM

## 2013-08-30 DIAGNOSIS — Z96659 Presence of unspecified artificial knee joint: Secondary | ICD-10-CM

## 2013-08-30 DIAGNOSIS — K219 Gastro-esophageal reflux disease without esophagitis: Secondary | ICD-10-CM

## 2013-08-30 DIAGNOSIS — F32A Depression, unspecified: Secondary | ICD-10-CM | POA: Insufficient documentation

## 2013-08-30 DIAGNOSIS — M1711 Unilateral primary osteoarthritis, right knee: Secondary | ICD-10-CM

## 2013-08-30 DIAGNOSIS — G35 Multiple sclerosis: Secondary | ICD-10-CM

## 2013-08-30 DIAGNOSIS — M1710 Unilateral primary osteoarthritis, unspecified knee: Secondary | ICD-10-CM

## 2013-08-30 DIAGNOSIS — I10 Essential (primary) hypertension: Secondary | ICD-10-CM

## 2013-08-30 NOTE — Progress Notes (Signed)
Patient ID: Julie Ross, female   DOB: [birth year], 53 y.o.   MRN: [id number]               PROGRESS NOTE  DATE: 08/19/2013  FACILITY: Nursing Home Location: [facility]  LEVEL OF CARE: SNF (31)  Acute Visit  CHIEF COMPLAINT:  Follow-up hospitalization  HISTORY OF PRESENT ILLNESS: This is a 53 year old female who has been admitted to [facility] on 08/14/13 from [facility] with Arthritis of right knee S/P right total knee arthroplasty. She has been admitted for a short-term rehabilitation.  REASSESSMENT OF ONGOING PROBLEM(S):  HTN: Pt 's HTN remains stable.  Denies CP, sob, DOE, pedal edema, headaches, dizziness or visual disturbances.  No complications from the medications currently being used.  Last BP : 116/49  DEPRESSION: The depression remains stable. Patient denies ongoing feelings of sadness, insomnia, anedhonia or lack of appetite. No complications reported from the medications currently being used. Staff do not report behavioral problems.  GERD: pt's GERD is stable.  Denies ongoing heartburn, abd. Pain, nausea or vomiting.  Currently on a PPI & tolerates it without any adverse reactions.  PAST MEDICAL HISTORY : Reviewed.  No changes.  CURRENT MEDICATIONS: Reviewed per [facility]  REVIEW OF SYSTEMS:  GENERAL: no change in appetite, no fatigue, no weight changes, no fever, chills or weakness RESPIRATORY: no cough, SOB, DOE, wheezing, hemoptysis CARDIAC: no chest pain, edema or palpitations GI: no abdominal pain, diarrhea, heart burn, nausea or vomiting, +constipation  PHYSICAL EXAMINATION  GENERAL: no acute distress, normal body habitus EYES: conjunctivae normal, sclerae normal, normal eye lids NECK: supple, trachea midline, no neck masses, no thyroid tenderness, no thyromegaly LYMPHATICS: no LAN in the neck, no supraclavicular LAN RESPIRATORY: breathing is even & unlabored, BS CTAB CARDIAC: RRR, no murmur,no extra heart sounds, no edema GI:  abdomen soft, normal BS, no masses, no tenderness, no hepatomegaly, no splenomegaly PSYCHIATRIC: the patient is alert & oriented to person, affect & behavior appropriate  LABS/RADIOLOGY: Labs reviewed: Basic Metabolic Panel:  Recent Labs  11/30/12 1029  04/12/13 1412 06/25/13 0958 08/05/13 1213  NA 137  < > 135 140 138  K 4.1  < > 4.2 3.4* 3.8  CL 100  --  97  --  97  CO2 27  < > 25 24 24   GLUCOSE 91  < > 109* 154* 143*  BUN 26.9*  < > 24* 22.9 22  CREATININE 1.6*  < > 1.22* 1.3* 1.16*  CALCIUM 9.4  < > 9.4 9.6 9.8  < > = values in this interval not displayed. Liver Function Tests:  Recent Labs  02/22/13 1048 03/15/13 1035 06/25/13 0958  AST 18 19 17   ALT 21 18 19   ALKPHOS 160* 140 160*  BILITOT 0.53 0.50 0.52  PROT 7.0 7.3 7.2  ALBUMIN 3.7 3.8 3.9    CBC:  Recent Labs  04/12/13 1412  06/25/13 0958 08/05/13 1213 08/13/13 0440 08/14/13 0424  WBC 8.1  < > 6.5 8.8 11.5* 12.6*  NEUTROABS 5.7  --  3.8 6.0  --   --   HGB 11.8*  < > 12.3 12.8 10.1* 7.8*  HCT 35.4*  < > 35.8 38.0 29.9* 23.1*  MCV 84.3  < > 84.6 83.9 84.2 84.0  PLT 224  < > 230 281 225 246  < > = values in this interval not displayed.   ASSESSMENT/PLAN:  Arthritis status post right total knee arthroplasty - for rehabilitation  Constipation (New) -  start Lactulose 20 gm/30 ml PO Q D and continue Colace Anxiety - continue Xanax Hypertension - well controlled; continue Norvasc and  Hyzaar Depression - stable; continue Cymbalta GERD - continue Prilosec Multiple sclerosis - continue Cymbalta    CPT CODE: 68127  Seth Bake - NP [facility] [phone]

## 2013-08-30 NOTE — Progress Notes (Signed)
Patient ID: Julie Ross, female   DOB: 1960-12-06, 53 y.o.   MRN: [id number]                 PROGRESS NOTE  DATE: 08/30/13  FACILITY: Nursing Home Location: [facility]  LEVEL OF CARE: SNF (31)  Acute Visit  CHIEF COMPLAINT:  Discharge Notes  HISTORY OF PRESENT ILLNESS: This is a 53 year old female who has been admitted to [facility] on 08/14/13 from [facility] with Arthritis of right knee S/P right total knee arthroplasty. Patient was admitted to this facility for short-term rehabilitation after the patient's recent hospitalization.  Patient has completed SNF rehabilitation and therapy has cleared the patient for discharge.   REASSESSMENT OF ONGOING PROBLEM(S):  HTN: Pt 's HTN remains stable.  Denies CP, sob, DOE, pedal edema, headaches, dizziness or visual disturbances.  No complications from the medications currently being used.  Last BP : 108/56  ANXIETY: The anxiety remains stable. Patient denies ongoing anxiety or irritability. No complications reported from the medications currently being used.  CONSTIPATION: The constipation remains stable. No complications from the medications presently being used. Patient denies ongoing constipation, abdominal pain, nausea or vomiting.   PAST MEDICAL HISTORY : Reviewed.  No changes.  CURRENT MEDICATIONS: Reviewed per [facility]  REVIEW OF SYSTEMS:  GENERAL: no change in appetite, no fatigue, no weight changes, no fever, chills or weakness RESPIRATORY: no cough, SOB, DOE, wheezing, hemoptysis CARDIAC: no chest pain, edema or palpitations GI: no abdominal pain, diarrhea, heart burn, nausea or vomiting,    PHYSICAL EXAMINATION  GENERAL: no acute distress, normal body habitus NECK: supple, trachea midline, no neck masses, no thyroid tenderness, no thyromegaly LYMPHATICS: no LAN in the neck, no supraclavicular LAN RESPIRATORY: breathing is even & unlabored, BS CTAB CARDIAC: RRR, no murmur,no extra heart sounds,  no edema GI: abdomen soft, normal BS, no masses, no tenderness, no hepatomegaly, no splenomegaly EXTREMITIES: able to move all 4 extremities; ambulates with a walker PSYCHIATRIC: the patient is alert & oriented to person, affect & behavior appropriate  LABS/RADIOLOGY: Labs reviewed: Basic Metabolic Panel:  Recent Labs  11/30/12 1029  04/12/13 1412 06/25/13 0958 08/05/13 1213  NA 137  < > 135 140 138  K 4.1  < > 4.2 3.4* 3.8  CL 100  --  97  --  97  CO2 27  < > 25 24 24   GLUCOSE 91  < > 109* 154* 143*  BUN 26.9*  < > 24* 22.9 22  CREATININE 1.6*  < > 1.22* 1.3* 1.16*  CALCIUM 9.4  < > 9.4 9.6 9.8  < > = values in this interval not displayed. Liver Function Tests:  Recent Labs  02/22/13 1048 03/15/13 1035 06/25/13 0958  AST 18 19 17   ALT 21 18 19   ALKPHOS 160* 140 160*  BILITOT 0.53 0.50 0.52  PROT 7.0 7.3 7.2  ALBUMIN 3.7 3.8 3.9    CBC:  Recent Labs  04/12/13 1412  06/25/13 0958 08/05/13 1213 08/13/13 0440 08/14/13 0424  WBC 8.1  < > 6.5 8.8 11.5* 12.6*  NEUTROABS 5.7  --  3.8 6.0  --   --   HGB 11.8*  < > 12.3 12.8 10.1* 7.8*  HCT 35.4*  < > 35.8 38.0 29.9* 23.1*  MCV 84.3  < > 84.6 83.9 84.2 84.0  PLT 224  < > 230 281 225 246  < > = values in this interval not displayed.   ASSESSMENT/PLAN:  Arthritis  status post right total knee arthroplasty - for Home health PT and Nursing Constipation  - no complaints Anxiety - continue Xanax Hypertension - well controlled; continue Norvasc and  Hyzaar Depression - stable; continue Cymbalta and Wellbutrin GERD - continue Prilosec Multiple sclerosis - continue Cymbalta   I have filled out patient's discharge paperwork and written prescriptions.  Patient will receive home health PT and Nursing.   Total discharge time: Less than 30 minutes Discharge time involved coordination of the discharge process with Education officer, museum, nursing staff and therapy department. Medical justification for home health services  verified.   CPT CODE: 46803   Seth Bake - NP [facility] [phone]

## 2013-09-16 ENCOUNTER — Telehealth: Payer: Self-pay

## 2013-09-16 NOTE — Telephone Encounter (Signed)
Per KK - pt to see [place].  Appt available 3/31 at 1145 - pt cannot make that time, would prefer Friday.  Advised pt I would check with LC and have scheduling contact her.  Pt voiced understanding.

## 2013-09-17 ENCOUNTER — Telehealth: Payer: Self-pay | Admitting: Oncology

## 2013-09-17 NOTE — Progress Notes (Signed)
Hematology and Oncology Follow Up Visit  Julie Ross [id number] 1960/11/11 53 y.o. 09/19/2013 10:44 PM     Principle Diagnosis:Julie Ross 53 y.o. female with stage II triple positive invasive ductal carcinoma of the left breast.     Prior Therapy: #1 patient was originally seen in the multidisciplinary breast clinic on 11/30/2011 for what at that time looked like a ductal carcinoma in situ of the left breast. Due to the extent of disease she was recommended a mastectomy with axillary lymph node sampling.   #2 12/19/2011 patient had a left mastectomy with sentinel lymph node biopsy. Her final pathology revealed 2 foci of invasive ductal carcinoma measuring 0.8 and 0.6 cm. The tumor was low grade ER positive PR positive. Patient's tumor was found to be HER-2/neu positive with HER-2 ratio of 3.27. Ki-67 was 11%. Patient had sentinel lymph node Biopsies performed one of 4 lymph nodes were positive for metastatic disease. Patient's final pathologic staging is T1 N1 M0. Stage II   #3 patient has received adjuvant chemotherapy consisting of Taxotere carboplatinum and Herceptin for 2 cycles starting in 01/2012. This was then complicated by development of bacteremia left knee sepsis requiring revision of the knee. Port-A-Cath site infection requiring removal of the Port-A-Cath and placement of a PICC line.   #4 patient then received Herceptin every 3 weeks with radiation therapy From 10/ 29 through 06/04/12 while she finished up her antibiotics.   #5 patient completed Taxotere carboplatinum and Herceptin combination to finish out her last 2 cycles. We discussed the risks and benefits of this again. This was started on 08/03/12, and she finished two cycles. The second cycle was delayed due to another incidence of left knee septic arthritis. Her hardware was removed, she was placed on IV antibiotics, and was cleared by infectious disease to finish her last cycle of [place] on 09/21/12.   #7 status post  adjuvant Herceptin on 10/19/12 - 04/14/13   #8 patient began curative intent Letrozole 2.5 mg daily starting in September 2014  Current therapy:  Letrozole 2.73m daily  Interim History: Julie SIAS89y.o. female with h/o stage II triple positive invasive ductal carcinoma of the left breast.  She is here for f/u today.  She is concerned due to memory changes that are progressively worsening.  She is having difficulty remembering conversations, putting things in the microwave, taking excel classes.  She is having difficulty doing crossword puzzles, sudoku, and reading that she previously enjoyed.  She is concerned about a lump on her left chest wall that she wants me to look out.  She first noticed it a couple of weeks ago while showering.  It is tender to palpation.  She has underwent her left knee revision in 03/2013 and right knee replacement in 07/2013.  She is more mobile since these surgeries.  She is f/u with neurology due to a diagnosis of MS.  She was recommended treatment but declined it because it may decrease her WBC count and she doesn't want that because she's afraid that her left knee hardware may become infected again.  She does have difficulty with the range of motion in her left shoulder and wants to know if she can have physical therapy on her right knee and her left shoulder at the same time.  She denies fevers, chills, nausea, vomiting, constipation, diarrhea, nubmness, or any pain (other than her knees).     Medications:  Current Outpatient Prescriptions  Medication Sig Dispense Refill  . ALPRAZolam (  XANAX) 0.5 MG tablet Take 0.5 mg by mouth at bedtime.      Marland Kitchen amLODipine (NORVASC) 10 MG tablet Take 10 mg by mouth daily before breakfast.       . aspirin EC 325 MG tablet Take 1 tablet (325 mg total) by mouth 2 (two) times daily.  30 tablet  0  . atenolol (TENORMIN) 25 MG tablet Take 0.5 tablets (12.5 mg total) by mouth daily before breakfast. For blood pressure  30 tablet  6  .  baclofen (LIORESAL) 10 MG tablet Take 10 mg by mouth 3 (three) times daily.       Marland Kitchen buPROPion (WELLBUTRIN XL) 150 MG 24 hr tablet Take 1 tablet (150 mg total) by mouth daily.  30 tablet  6  . CALCIUM-MAGNESIUM PO Take 1 tablet by mouth daily.      Marland Kitchen docusate sodium (COLACE) 50 MG capsule Take 100 mg by mouth at bedtime.      . DULoxetine (CYMBALTA) 60 MG capsule Take 1 capsule (60 mg total) by mouth daily.  30 capsule  3  . gabapentin (NEURONTIN) 300 MG capsule Take 300 mg by mouth 2 (two) times daily.       Marland Kitchen letrozole (FEMARA) 2.5 MG tablet Take 2.5 mg by mouth daily.      Marland Kitchen losartan-hydrochlorothiazide (HYZAAR) 100-25 MG per tablet Take 1 tablet by mouth daily before breakfast.  30 tablet  6  . methocarbamol (ROBAXIN) 500 MG tablet Take 500 mg by mouth every 6 (six) hours as needed for muscle spasms.      . Multiple Vitamins-Minerals (MULTIVITAMINS THER. W/MINERALS) TABS Take 1 tablet by mouth daily.      Marland Kitchen omeprazole (PRILOSEC) 40 MG capsule Take 40 mg by mouth daily.      Marland Kitchen oxyCODONE-acetaminophen (PERCOCET) 10-325 MG per tablet Take 1 tablet by mouth every 4 (four) hours as needed for pain.  60 tablet  0  . Probiotic Product (PROBIOTIC DAILY PO) Take 1 capsule by mouth daily.       . vitamin E 400 UNIT capsule Take 800 Units by mouth daily.       No current facility-administered medications for this visit.   Facility-Administered Medications Ordered in Other Visits  Medication Dose Route Frequency Provider Last Rate Last Dose  . ceFAZolin (ANCEF) IVPB 2 g/50 mL premix    PRN Tawni Millers, CRNA   2 g at 04/19/13 0956  . dexamethasone (DECADRON) injection    PRN Tawni Millers, CRNA   8 mg at 05/01/12 0908  . fentaNYL (SUBLIMAZE) injection    PRN Tawni Millers, CRNA   100 mcg at 05/01/12 0856  . lidocaine (cardiac) 100 mg/71m (XYLOCAINE) 20 MG/ML injection 2%    PRN WTawni Millers CRNA   60 mg at 05/01/12 0856  . midazolam (VERSED) 5 MG/5ML injection    PRN WTawni Millers CRNA   2 mg  at 05/01/12 0856  . propofol (DIPRIVAN) 10 mg/mL bolus/IV push    PRN WTawni Millers CRNA   160 mg at 05/01/12 0900     Allergies: No Known Allergies  Medical History: Past Medical History  Diagnosis Date  . Hypertension   . Hypothyroidism   . Neuromuscular disorder     MS TX WITH CYMBALTA   . Multiple sclerosis   . H/O colonoscopy   . H/O bone density study 03/2011  . Wears glasses   . Heart burn   . Depression   . Hx of radiation therapy 04/17/12- 06/04/12  left chest wall, axilla, L supraclavicular fossa 4500 cGy, left mastectomy scar/chest wall 5940 cGy  . H/O echocardiogram     last one 02/2013, due to chemotherapy  . Sleep apnea     MILD SLEEP APNEA, NO (Needed) MACHINE  6 YRS AGO HPT REGIONAL   . GERD (gastroesophageal reflux disease)   . Breast cancer 11/2011    lul/ER+PR+, x1 lymph node   . Cancer     squamous cell skin cancer x7  . Arthritis     knees   . Anemia     h/o - ? bld. transfusion - more recent- ?2013  . Anxiety     uses xanax mainly for sleep    Surgical History:  Past Surgical History  Procedure Laterality Date  . Tonsillectomy    . Tumor removal      FROM PELVIS AGE 57  . Cesarean section      X2   . No past surgeries      BLADDER SLING  4-5 YRS AGO   . Knee arthroscopy      LT KNEE 04/2011  . Total knee arthroplasty  08/15/2011    Procedure: TOTAL KNEE ARTHROPLASTY; lft Surgeon: Alta Corning, MD;  Location: [place];  Service: Orthopedics;  Laterality: Left;  RIGHT KNEE CORTIZONE INJECTION  . Mohs surgery      right face  . Knee arthroscopy  82    rt  . Mastectomy w/ sentinel node biopsy  12/19/2011    Procedure: MASTECTOMY WITH SENTINEL LYMPH NODE BIOPSY;  Surgeon: Haywood Lasso, MD;  Location: [place];  Service: General;  Laterality: Left;  left breast and left axilla   . Portacath placement  01/16/2012    Procedure: INSERTION PORT-A-CATH;  Surgeon: Haywood Lasso, MD;  Location: [place];  Service: General;   Laterality: Right;  [place] Cath Placement   . I&d knee with poly exchange  03/02/2012    Procedure: IRRIGATION AND DEBRIDEMENT KNEE WITH POLY EXCHANGE;  Surgeon: Alta Corning, MD;  Location: [place];  Service: Orthopedics;  Laterality: Left;  I&D of total knee with Possible Poly Exchange   . Tee without cardioversion  03/06/2012    Procedure: TRANSESOPHAGEAL ECHOCARDIOGRAM (TEE);  Surgeon: Josue Hector, MD;  Location: [place];  Service: Cardiovascular;  Laterality: N/A;  Rm. [id number]  . Port-a-cath removal  03/06/2012    Procedure: REMOVAL PORT-A-CATH;  Surgeon: Odis Hollingshead, MD;  Location: [place];  Service: General;  Laterality: N/A;  . Portacath placement  05/01/2012    Procedure: INSERTION PORT-A-CATH;  Surgeon: Haywood Lasso, MD;  Location: [place];  Service: General;  Laterality: Right;  right internal jugular port-a-cath insertion  . Total knee arthroplasty Left 08/18/2012    Procedure:  Irrigation and debridement of LEFT total knee;  Removal of total knee parts; Implant of spacers;  Surgeon: Kerin Salen, MD;  Location: [place];  Service: Orthopedics;  Laterality: Left;  . Port-a-cath removal Right 08/22/2012    Procedure: REMOVAL PORT-A-CATH;  Surgeon: Edward Jolly, MD;  Location: [place];  Service: General;  Laterality: Right;  . Joint replacement  Feb 2013    left knee- multiple surgeries   . Mastectomy    . Total knee revision Left 04/19/2013    Procedure: TOTAL KNEE REVISION AND REMOVAL CEMENT SPACER;  Surgeon: Kerin Salen, MD;  Location: [place];  Service: Orthopedics;  Laterality: Left;  . Total knee arthroplasty Right 08/12/2013  .  Total knee arthroplasty Right 08/12/2013    Procedure: RIGHT TOTAL KNEE ARTHROPLASTY;  Surgeon: Kerin Salen, MD;  Location: [place];  Service: Orthopedics;  Laterality: Right;     Review of Systems: A 10 point review of systems was conducted and is otherwise negative except for what is noted above.    Health  Maintenance  Mammogram: 11/2012 Colonoscopy: unknown, needs f/u Bone Density Scan: unknown  Pap Smear: 2014 Eye Exam: due Vitamin D Level: next appt Lipid Panel:unknown    Physical Exam: Blood pressure 125/72, pulse 58, temperature 97.7 F (36.5 C), resp. rate 18, height 5' 6" (1.676 m), weight 211 lb 8 oz (95.936 kg), last menstrual period 12/05/2011. GENERAL: Patient is a well appearing female in no acute distress HEENT:  Sclerae anicteric.  Oropharynx clear and moist. No ulcerations or evidence of oropharyngeal candidiasis. Neck is supple.  NODES:  No cervical, supraclavicular, or axillary lymphadenopathy palpated.  BREAST EXAM:  Left mastectomy site without nodularity or sign of recurrence, + costochondral tenderness. LUNGS:  Clear to auscultation bilaterally.  No wheezes or rhonchi. HEART:  Regular rate and rhythm. No murmur appreciated. ABDOMEN:  Soft, nontender.  Positive, normoactive bowel sounds. No organomegaly palpated. MSK:  No focal spinal tenderness to palpation. Limited range of motion in left upper extremity. EXTREMITIES:  No peripheral edema.   SKIN:  Clear with no obvious rashes or skin changes. No nail dyscrasia. NEURO:  Nonfocal. Well oriented.  Appropriate affect. ECOG PERFORMANCE STATUS: 2 - Symptomatic, <50% confined to bed   Lab Results: Lab Results  Component Value Date   WBC 7.2 09/18/2013   HGB 12.0 09/18/2013   HCT 36.3 09/18/2013   MCV 84.5 09/18/2013   PLT 241 09/18/2013     Chemistry      Component Value Date/Time   NA 139 09/18/2013 1046   NA 138 08/05/2013 1213   K 3.3* 09/18/2013 1046   K 3.8 08/05/2013 1213   CL 97 08/05/2013 1213   CL 100 11/30/2012 1029   CO2 21* 09/18/2013 1046   CO2 24 08/05/2013 1213   BUN 20.2 09/18/2013 1046   BUN 22 08/05/2013 1213   CREATININE 1.3* 09/18/2013 1046   CREATININE 1.16* 08/05/2013 1213      Component Value Date/Time   CALCIUM 9.4 09/18/2013 1046   CALCIUM 9.8 08/05/2013 1213   ALKPHOS 172* 09/18/2013 1046   ALKPHOS  400* 09/01/2012 1420   AST 14 09/18/2013 1046   AST 28 09/01/2012 1420   ALT 9 09/18/2013 1046   ALT <5 09/01/2012 1420   BILITOT 0.61 09/18/2013 1046   BILITOT 0.4 09/01/2012 1420         Assessment and Plan: Jaklyn M Urich 53 y.o. female with  1. stage II triple positive invasive ductal carcinoma of the left breast.  The patient has underwent mastectomy, adjuvant chemotherapy, and one year of adjuvant herceptin.  She is taking Letrozole daily and tolerating this well.  She will continue this.    2.  Left chest wall pain.  Patient is tender mainly in her costochondral muscles, however we will order a bone scan to evaluate.  3.  Memory changes:  She has been diagnosed with MS and is following up with a neurologist.  However, I will order MRI of the brain to ensure she has no metastases.   4.  Anxiety and Depression: Patient is taking Wellbutrin and Cymbalta.  She has had quite a complicated course with her knee replacements and the infection of  her left knee hardware causing interruptions in chemotherapy along with the fear of possibly having another infection and the potential of losing her leg. We referred her to [facility] for counseling today.   5. Limited range of motion in left upper extremity.  I spoke with our cancer rehab physical therapist and she came into the appointment and spoke with the patient.  I have referred her to start physical therapy on both her knee and shoulder.  She will be scheduled for PT in 2 weeks time.    The patient will f/u in 3 months for labs and evaluation.  She knows to contact us in the interim should she have any questions or concerns.  We can certainly see her sooner if needed.   I spent 25 minutes counseling the patient face to face.  The total time spent in the appointment was 30 minutes.  Minette Headland, [place] [phone] 09/19/2013 10:44 PM

## 2013-09-17 NOTE — Telephone Encounter (Signed)
s.w. pt and advised on 4.1.15 appt....pt ok adn aware

## 2013-09-18 ENCOUNTER — Other Ambulatory Visit (HOSPITAL_BASED_OUTPATIENT_CLINIC_OR_DEPARTMENT_OTHER): Payer: 59

## 2013-09-18 ENCOUNTER — Telehealth: Payer: Self-pay | Admitting: Adult Health

## 2013-09-18 ENCOUNTER — Encounter: Payer: Self-pay | Admitting: Adult Health

## 2013-09-18 ENCOUNTER — Ambulatory Visit (HOSPITAL_BASED_OUTPATIENT_CLINIC_OR_DEPARTMENT_OTHER): Payer: 59 | Admitting: Adult Health

## 2013-09-18 VITALS — BP 125/72 | HR 58 | Temp 97.7°F | Resp 18 | Ht 66.0 in | Wt 211.5 lb

## 2013-09-18 DIAGNOSIS — C50419 Malignant neoplasm of upper-outer quadrant of unspecified female breast: Secondary | ICD-10-CM

## 2013-09-18 DIAGNOSIS — Z96651 Presence of right artificial knee joint: Secondary | ICD-10-CM

## 2013-09-18 DIAGNOSIS — Z170 Estrogen receptor positive status [ER+]: Secondary | ICD-10-CM

## 2013-09-18 DIAGNOSIS — M2560 Stiffness of unspecified joint, not elsewhere classified: Secondary | ICD-10-CM

## 2013-09-18 DIAGNOSIS — Z9012 Acquired absence of left breast and nipple: Secondary | ICD-10-CM

## 2013-09-18 DIAGNOSIS — F411 Generalized anxiety disorder: Secondary | ICD-10-CM

## 2013-09-18 DIAGNOSIS — C50412 Malignant neoplasm of upper-outer quadrant of left female breast: Secondary | ICD-10-CM

## 2013-09-18 DIAGNOSIS — R29898 Other symptoms and signs involving the musculoskeletal system: Secondary | ICD-10-CM

## 2013-09-18 DIAGNOSIS — Z9010 Acquired absence of unspecified breast and nipple: Secondary | ICD-10-CM

## 2013-09-18 DIAGNOSIS — F3289 Other specified depressive episodes: Secondary | ICD-10-CM

## 2013-09-18 DIAGNOSIS — Z96659 Presence of unspecified artificial knee joint: Secondary | ICD-10-CM

## 2013-09-18 DIAGNOSIS — R0789 Other chest pain: Secondary | ICD-10-CM

## 2013-09-18 DIAGNOSIS — R413 Other amnesia: Secondary | ICD-10-CM

## 2013-09-18 DIAGNOSIS — F329 Major depressive disorder, single episode, unspecified: Secondary | ICD-10-CM

## 2013-09-18 DIAGNOSIS — F419 Anxiety disorder, unspecified: Secondary | ICD-10-CM

## 2013-09-18 DIAGNOSIS — R071 Chest pain on breathing: Secondary | ICD-10-CM

## 2013-09-18 LAB — COMPREHENSIVE METABOLIC PANEL (CC13)
ALT: 9 U/L (ref 0–55)
AST: 14 U/L (ref 5–34)
Albumin: 4 g/dL (ref 3.5–5.0)
Alkaline Phosphatase: 172 U/L — ABNORMAL HIGH (ref 40–150)
Anion Gap: 15 mEq/L — ABNORMAL HIGH (ref 3–11)
BUN: 20.2 mg/dL (ref 7.0–26.0)
CO2: 21 mEq/L — ABNORMAL LOW (ref 22–29)
Calcium: 9.4 mg/dL (ref 8.4–10.4)
Chloride: 102 mEq/L (ref 98–109)
Creatinine: 1.3 mg/dL — ABNORMAL HIGH (ref 0.6–1.1)
Glucose: 128 mg/dl (ref 70–140)
Potassium: 3.3 mEq/L — ABNORMAL LOW (ref 3.5–5.1)
Sodium: 139 mEq/L (ref 136–145)
Total Bilirubin: 0.61 mg/dL (ref 0.20–1.20)
Total Protein: 7.2 g/dL (ref 6.4–8.3)

## 2013-09-18 LAB — CBC WITH DIFFERENTIAL/PLATELET
BASO%: 1.3 % (ref 0.0–2.0)
Basophils Absolute: 0.1 10*3/uL (ref 0.0–0.1)
EOS%: 3.4 % (ref 0.0–7.0)
Eosinophils Absolute: 0.2 10*3/uL (ref 0.0–0.5)
HCT: 36.3 % (ref 34.8–46.6)
HGB: 12 g/dL (ref 11.6–15.9)
LYMPH%: 23.9 % (ref 14.0–49.7)
MCH: 27.9 pg (ref 25.1–34.0)
MCHC: 33 g/dL (ref 31.5–36.0)
MCV: 84.5 fL (ref 79.5–101.0)
MONO#: 0.4 10*3/uL (ref 0.1–0.9)
MONO%: 5.8 % (ref 0.0–14.0)
NEUT#: 4.8 10*3/uL (ref 1.5–6.5)
NEUT%: 65.6 % (ref 38.4–76.8)
Platelets: 241 10*3/uL (ref 145–400)
RBC: 4.29 10*6/uL (ref 3.70–5.45)
RDW: 15 % — ABNORMAL HIGH (ref 11.2–14.5)
WBC: 7.2 10*3/uL (ref 3.9–10.3)
lymph#: 1.7 10*3/uL (ref 0.9–3.3)

## 2013-09-18 NOTE — Patient Instructions (Signed)
Potassium Content of Foods Potassium is a mineral found in many foods and drinks. It helps keep fluids and minerals balanced in your body and also affects how steadily your heart beats. The body needs potassium to control blood pressure and to keep the muscles and nervous system healthy. However, certain health conditions and medicine may require you to eat more or less potassium-rich foods and drinks. Your caregiver or dietitian will tell you how much potassium you should have each day. COMMON SERVING SIZES The list below tells you how big or small common portion sizes are:  1 oz.........4 stacked dice.  3 oz.........Deck of cards.  1 tsp........Tip of little finger.  1 tbsp......Thumb.  2 tbsp......Golf ball.   c...........Half of a fist.  1 c............A fist. FOODS AND DRINKS HIGH IN POTASSIUM More than 200 mg of potassium per serving. A serving size is  c (120 mL or noted gram weight) unless otherwise stated. While all the items on this list are high in potassium, some items are higher in potassium than others. Fruits  Apricots (sliced), 83 g.  Apricots (dried halves), 3 oz / 24 g.  Avocado (cubed),  c / 50 g.  Banana (sliced), 75 g.  Cantaloupe (cubed), 80 g.  Dates (pitted), 5 whole / 35 g.  Figs (dried), 4 whole / 32 g.  Guava, c / 55 g.  Honeydew, 1 wedge / 85 g.  Kiwi (sliced), 90 g.  Nectarine, 1 small / 129 g.  Orange, 1 medium / 131 g.  Orange juice.  Pomegranate seeds, 87 g.  Pomegranate juice.  Prunes (pitted), 3 whole / 30 g.  Prune juice, 3 oz / 90 mL.  Seedless raisins, 3 tbsp / 27 g. Vegetables  Artichoke,  of a medium / 64 g.  Asparagus (boiled), 90 g.  Baked beans,  c / 63 g.  Bamboo shoots,  c / 38 g.  Beets (cooked slices), 85 g.  Broccoli (boiled), 78 g.  Brussels sprout (boiled), 78 g.  Butternut squash (baked), 103 g.  Chickpea (cooked), 82 g.  Green peas (cooked), 80 g.  Hubbard squash (baked cubes),  c /  68 g.  Kidney beans (cooked), 5 tbsp / 55 g.  Lima beans (cooked),  c / 43 g.  Navy beans (cooked),  c / 61 g.  Potato (baked), 61 g.  Potato (boiled), 78 g.  Pumpkin (boiled), 123 g.  Refried beans,  c / 79 g.  Spinach (cooked),  c / 45 g.  Split peas (cooked),  c / 65 g.  Sun-dried tomatoes, 2 tbsp / 7 g.  Sweet potato (baked),  c / 50 g.  Tomato (chopped or sliced), 90 g.  Tomato juice.  Tomato paste, 4 tsp / 21 g.  Tomato sauce,  c / 61 g.  Vegetable juice.  White mushrooms (cooked), 78 g.  Yam (cooked or baked),  c / 34 g.  Zucchini squash (boiled), 90 g. Other Foods and Drinks  Almonds (whole),  c / 36 g.  Cashews (oil roasted),  c / 32 g.  Chocolate milk.  Chocolate pudding, 142 g.  Clams (steamed), 1.5 oz / 43 g.  Dark chocolate, 1.5 oz / 42 g.  Fish, 3 oz / 85 g.  King crab (steamed), 3 oz / 85 g.  Lobster (steamed), 4 oz / 113 g.  Milk (skim, 1%, 2%, whole), 1 c / 240 mL.  Milk chocolate, 2.3 oz / 66 g.  Milk shake.  Nonfat fruit   variety yogurt, 123 g.  Peanuts (oil roasted), 1 oz / 28 g.  Peanut butter, 2 tbsp / 32 g.  Pistachio nuts, 1 oz / 28 g.  Pumpkin seeds, 1 oz / 28 g.  Red meat (broiled, cooked, grilled), 3 oz / 85 g.  Scallops (steamed), 3 oz / 85 g.  Shredded wheat cereal (dry), 3 oblong biscuits / 75 g.  Spaghetti sauce,  c / 66 g.  Sunflower seeds (dry roasted), 1 oz / 28 g.  Veggie burger, 1 patty / 70 g. FOODS MODERATE IN POTASSIUM Between 150 mg and 200 mg per serving. A serving is  c (120 mL or noted gram weight) unless otherwise stated. Fruits  Grapefruit,  of the fruit / 123 g.  Grapefruit juice.  Pineapple juice.  Plums (sliced), 83 g.  Tangerine, 1 large / 120 g. Vegetables  Carrots (boiled), 78 g.  Carrots (sliced), 61 g.  Rhubarb (cooked with sugar), 120 g.  Rutabaga (cooked), 120 g.  Sweet corn (cooked), 75 g.  Yellow snap beans (cooked), 63 g. Other Foods and  Drinks   Bagel, 1 bagel / 98 g.  Chicken breast (roasted and chopped),  c / 70 g.  Chocolate ice cream / 66 g.  Pita bread, 1 large / 64 g.  Shrimp (steamed), 4 oz / 113 g.  Swiss cheese (diced), 70 g.  Vanilla ice cream, 66 g.  Vanilla pudding, 140 g. FOODS LOW IN POTASSIUM Less than 150 mg per serving. A serving size is  cup (120 mL or noted gram weight) unless otherwise stated. If you eat more than 1 serving of a food low in potassium, the food may be considered a food high in potassium. Fruits  Apple (slices), 55 g.  Apple juice.  Applesauce, 122 g.  Blackberries, 72 g.  Blueberries, 74 g.  Cranberries, 50 g.  Cranberry juice.  Fruit cocktail, 119 g.  Fruit punch.  Grapes, 46 g.  Grape juice.  Mandarin oranges (canned), 126 g.  Peach (slices), 77 g.  Pineapple (chunks), 83 g.  Raspberries, 62 g.  Red cherries (without pits), 78 g.  Strawberries (sliced), 83 g.  Watermelon (diced), 76 g. Vegetables  Alfalfa sprouts, 17 g.  Bell peppers (sliced), 46 g.  Cabbage (shredded), 35 g.  Cauliflower (boiled), 62 g.  Celery, 51 g.  Collard greens (boiled), 95 g.  Cucumber (sliced), 52 g.  Eggplant (cubed), 41 g.  Green beans (boiled), 63 g.  Lettuce (shredded), 1 c / 36 g.  Onions (sauteed), 44 g.  Radishes (sliced), 58 g.  Spaghetti squash, 51 g. Other Foods and Drinks  Angel food cake, 1 slice / 28 g.  Black tea.  Brown rice (cooked), 98 g.  Butter croissant, 1 medium / 57 g.  Carbonated soda.  Coffee.  Cheddar cheese (diced), 66 g.  Corn flake cereal (dry), 14 g.  Cottage cheese, 118 g.  Cream of rice cereal (cooked), 122 g.  Cream of wheat cereal (cooked), 126 g.  Crisped rice cereal (dry), 14 g.  Egg (boiled, fried, poached, omelet, scrambled), 1 large / 46 61 g.  English muffin, 1 muffin / 57 g.  Frozen ice pop, 1 pop / 55 g.  Graham cracker, 1 large rectangular cracker / 14 g.  Jelly beans, 112  g.  Non-dairy whipped topping.  Oatmeal, 88 g.  Orange sherbet, 74 g.  Puffed rice cereal (dry), 7 g.  Pasta (cooked), 70 g.  Rice cakes, 4 cakes / 36   g.  Sugared doughnut, 4 oz / 116 g.  White bread, 1 slice / 30 g.  White rice (cooked), 79 93 g.  Wild rice (cooked), 82 g.  Yellow cake, 1 slice / 68 g. Document Released: 01/18/2005 Document Revised: 05/23/2012 Document Reviewed: 10/21/2011 Exit[employer] Patient Information 2014 [employer].  

## 2013-09-18 NOTE — Telephone Encounter (Signed)
, °

## 2013-09-26 ENCOUNTER — Ambulatory Visit (HOSPITAL_COMMUNITY)
Admission: RE | Admit: 2013-09-26 | Discharge: 2013-09-26 | Disposition: A | Discharge status: Home / Self Care | Payer: 59 | Source: Ambulatory Visit | Attending: Adult Health | Admitting: Adult Health

## 2013-09-26 ENCOUNTER — Encounter (HOSPITAL_COMMUNITY)
Admission: RE | Admit: 2013-09-26 | Discharge: 2013-09-26 | Disposition: A | Discharge status: Home / Self Care | Payer: 59 | Source: Ambulatory Visit | Attending: Adult Health | Admitting: Adult Health

## 2013-09-26 DIAGNOSIS — C50919 Malignant neoplasm of unspecified site of unspecified female breast: Secondary | ICD-10-CM | POA: Insufficient documentation

## 2013-09-26 DIAGNOSIS — R413 Other amnesia: Secondary | ICD-10-CM

## 2013-09-26 DIAGNOSIS — G35 Multiple sclerosis: Secondary | ICD-10-CM | POA: Insufficient documentation

## 2013-09-26 DIAGNOSIS — R0789 Other chest pain: Secondary | ICD-10-CM | POA: Insufficient documentation

## 2013-09-26 DIAGNOSIS — G936 Cerebral edema: Secondary | ICD-10-CM | POA: Insufficient documentation

## 2013-09-26 MED ORDER — GADOBENATE DIMEGLUMINE 529 MG/ML IV SOLN
20.0000 mL | Freq: Once | INTRAVENOUS | Status: AC | PRN
  Administered 2013-09-26: 20 mL via INTRAVENOUS

## 2013-09-26 MED ORDER — TECHNETIUM TC 99M MEDRONATE IV KIT
22.0000 | PACK | Freq: Once | INTRAVENOUS | Status: AC | PRN
  Administered 2013-09-26: 22 via INTRAVENOUS

## 2013-09-27 ENCOUNTER — Telehealth: Payer: Self-pay | Admitting: *Deleted

## 2013-09-27 ENCOUNTER — Other Ambulatory Visit: Payer: Self-pay | Admitting: Adult Health

## 2013-09-27 DIAGNOSIS — C50419 Malignant neoplasm of upper-outer quadrant of unspecified female breast: Secondary | ICD-10-CM

## 2013-09-27 NOTE — Telephone Encounter (Signed)
Message copied by Harmon Pier on Fri Sep 27, 2013  4:50 PM ------      Message from: Minette Headland      Created: Fri Sep 27, 2013  3:53 PM       Please call patient with normal results.            Thanks, Melissa       ----- Message -----         From: Rad Results In Interface         Sent: 09/26/2013   2:57 PM           To: Minette Headland, NP                   ------

## 2013-09-27 NOTE — Telephone Encounter (Signed)
Called pt to inform her of bone scan results. Communicated with pt that her scan was normal, no metastatic disease to  bone. Pt verbalized understanding. Message to be forwarded to [place].

## 2013-09-30 ENCOUNTER — Telehealth: Payer: Self-pay

## 2013-09-30 ENCOUNTER — Ambulatory Visit: Payer: 59 | Attending: Oncology | Admitting: Physical Therapy

## 2013-09-30 DIAGNOSIS — M25519 Pain in unspecified shoulder: Secondary | ICD-10-CM | POA: Insufficient documentation

## 2013-09-30 DIAGNOSIS — M25569 Pain in unspecified knee: Secondary | ICD-10-CM | POA: Insufficient documentation

## 2013-09-30 DIAGNOSIS — M24519 Contracture, unspecified shoulder: Secondary | ICD-10-CM | POA: Insufficient documentation

## 2013-09-30 DIAGNOSIS — IMO0001 Reserved for inherently not codable concepts without codable children: Secondary | ICD-10-CM | POA: Insufficient documentation

## 2013-09-30 DIAGNOSIS — R293 Abnormal posture: Secondary | ICD-10-CM | POA: Insufficient documentation

## 2013-09-30 NOTE — Telephone Encounter (Signed)
Per LC, let patient know that her MRI results showed no cancer in the brain.  [facility] requested name of her neurologist so she can send them copy of the results.  Pt reports her neurologist is Dr. Felecia Shelling at [facility].  Per LC - ok to fax MRI results neurologist.  Faxed and sent to scan.

## 2013-10-02 ENCOUNTER — Ambulatory Visit: Payer: 59 | Admitting: Physical Therapy

## 2013-10-08 ENCOUNTER — Encounter: Payer: 59 | Admitting: Physical Therapy

## 2013-10-10 ENCOUNTER — Ambulatory Visit: Payer: 59 | Admitting: Physical Therapy

## 2013-10-14 ENCOUNTER — Ambulatory Visit: Payer: 59 | Admitting: Physical Therapy

## 2013-10-14 ENCOUNTER — Encounter: Payer: Self-pay | Admitting: Family Medicine

## 2013-10-14 ENCOUNTER — Ambulatory Visit (INDEPENDENT_AMBULATORY_CARE_PROVIDER_SITE_OTHER): Payer: 59 | Admitting: Family Medicine

## 2013-10-14 ENCOUNTER — Ambulatory Visit: Payer: 59

## 2013-10-14 VITALS — BP 134/80 | HR 68 | Temp 98.2°F | Resp 16 | Wt 212.5 lb

## 2013-10-14 DIAGNOSIS — R110 Nausea: Secondary | ICD-10-CM

## 2013-10-14 DIAGNOSIS — F329 Major depressive disorder, single episode, unspecified: Secondary | ICD-10-CM

## 2013-10-14 DIAGNOSIS — F32A Depression, unspecified: Secondary | ICD-10-CM

## 2013-10-14 DIAGNOSIS — G35 Multiple sclerosis: Secondary | ICD-10-CM

## 2013-10-14 DIAGNOSIS — F3289 Other specified depressive episodes: Secondary | ICD-10-CM

## 2013-10-14 DIAGNOSIS — R413 Other amnesia: Secondary | ICD-10-CM

## 2013-10-14 DIAGNOSIS — M25519 Pain in unspecified shoulder: Secondary | ICD-10-CM | POA: Insufficient documentation

## 2013-10-14 DIAGNOSIS — R748 Abnormal levels of other serum enzymes: Secondary | ICD-10-CM

## 2013-10-14 LAB — HEPATIC FUNCTION PANEL
ALT: 19 U/L (ref 0–35)
AST: 22 U/L (ref 0–37)
Albumin: 4 g/dL (ref 3.5–5.2)
Alkaline Phosphatase: 145 U/L — ABNORMAL HIGH (ref 39–117)
Bilirubin, Direct: 0 mg/dL (ref 0.0–0.3)
Total Bilirubin: 0.4 mg/dL (ref 0.3–1.2)
Total Protein: 7.1 g/dL (ref 6.0–8.3)

## 2013-10-14 LAB — CBC WITH DIFFERENTIAL/PLATELET
Basophils Absolute: 0 10*3/uL (ref 0.0–0.1)
Basophils Relative: 0.7 % (ref 0.0–3.0)
Eosinophils Absolute: 0.3 10*3/uL (ref 0.0–0.7)
Eosinophils Relative: 4.5 % (ref 0.0–5.0)
HCT: 35.4 % — ABNORMAL LOW (ref 36.0–46.0)
Hemoglobin: 11.8 g/dL — ABNORMAL LOW (ref 12.0–15.0)
Lymphocytes Relative: 32 % (ref 12.0–46.0)
Lymphs Abs: 1.9 10*3/uL (ref 0.7–4.0)
MCHC: 33.2 g/dL (ref 30.0–36.0)
MCV: 84.6 fl (ref 78.0–100.0)
Monocytes Absolute: 0.3 10*3/uL (ref 0.1–1.0)
Monocytes Relative: 4.7 % (ref 3.0–12.0)
Neutro Abs: 3.4 10*3/uL (ref 1.4–7.7)
Neutrophils Relative %: 58.1 % (ref 43.0–77.0)
Platelets: 238 10*3/uL (ref 150.0–400.0)
RBC: 4.19 Mil/uL (ref 3.87–5.11)
RDW: 14.6 % (ref 11.5–14.6)
WBC: 5.8 10*3/uL (ref 4.5–10.5)

## 2013-10-14 LAB — BASIC METABOLIC PANEL
BUN: 16 mg/dL (ref 6–23)
CO2: 29 mEq/L (ref 19–32)
Calcium: 9.5 mg/dL (ref 8.4–10.5)
Chloride: 99 mEq/L (ref 96–112)
Creatinine, Ser: 1.2 mg/dL (ref 0.4–1.2)
GFR: 51.02 mL/min — ABNORMAL LOW (ref >60.00)
Glucose, Bld: 102 mg/dL — ABNORMAL HIGH (ref 70–99)
Potassium: 3.4 mEq/L — ABNORMAL LOW (ref 3.5–5.1)
Sodium: 138 mEq/L (ref 135–145)

## 2013-10-14 LAB — GAMMA GT: GGT: 45 U/L (ref 7–51)

## 2013-10-14 LAB — H. PYLORI ANTIBODY, IGG: H Pylori IgG: NEGATIVE

## 2013-10-14 MED ORDER — ALPRAZOLAM 0.5 MG PO TABS
0.5000 mg | ORAL_TABLET | Freq: Every day | ORAL | Status: DC
Start: 2013-10-14 — End: 2014-08-12

## 2013-10-14 MED ORDER — MELOXICAM 15 MG PO TABS: 15.0000 mg | ORAL_TABLET | Freq: Every day | ORAL | Status: DC

## 2013-10-14 MED ORDER — ONDANSETRON HCL 4 MG PO TABS: 4.0000 mg | ORAL_TABLET | Freq: Three times a day (TID) | ORAL | Status: DC | PRN

## 2013-10-14 NOTE — Progress Notes (Signed)
   Subjective:    Patient ID: Julie Ross, female    DOB: 19-Jul-1960, 53 y.o.   MRN: [id number]  HPI Shoulder pain- L shoulder 'real bad' and R shoulder 'starting to act up'.  L shoulder started hurting a few years ago but became severe a few months ago.  R hand dominant.  Now unable to reach into refrigerator or grasp things w/o severe pain.  Unable to do overhead motion.  Ambulates w/ cane in L hand.  Has relationship w/ [facility] (Dr Mayer Camel)  Memory issues- pt reports this has worsened since chemo.  Having difficulty w/ crosswords and suduko.  'zoning out' while watching TV.  Has neurologist but has not followed up as Oncology has recommended.  Nausea- sxs started ~1 week ago.  Every night ~10pm is needing to get up and get sprite to settle stomach.  No vomiting.  No fevers.  No abd pain.  No GERD.  'can you just give me something for it'.     Review of Systems For ROS see HPI     Objective:   Physical Exam  Vitals reviewed. Constitutional: She is oriented to person, place, and time. She appears well-developed and well-nourished. No distress.  HENT:  Head: Normocephalic and atraumatic.  Cardiovascular: Normal rate, regular rhythm, normal heart sounds and intact distal pulses.   Pulmonary/Chest: Effort normal and breath sounds normal. No respiratory distress. She has no wheezes. She has no rales.  Abdominal: Soft. Bowel sounds are normal. She exhibits no distension. There is no tenderness. There is no rebound.  Musculoskeletal:  Very limited ROM of L shoulder- resists forward flexion above 90 due to pain, resists abduction >60 due to pain.  Refuses internal rotation.  Mild impingement signs.  Neurological: She is alert and oriented to person, place, and time.  Skin: Skin is warm and dry.          Assessment & Plan:

## 2013-10-14 NOTE — Progress Notes (Signed)
Pre visit review using our clinic review tool, if applicable. No additional management support is needed unless otherwise documented below in the visit note. 

## 2013-10-14 NOTE — Patient Instructions (Signed)
Follow up as needed Start the Meloxicam daily for shoulder inflammation We'll call you with your ortho appt Please call and schedule an appt with your Neurologist about your memory issues Use the Zofran as needed for nausea We'll notify you of your lab results and make any changes if needed Call with any questions or concerns Hang in there!

## 2013-10-14 NOTE — Assessment & Plan Note (Signed)
New.  Pt notes this worsened after chemo.  Has neurologist but has not followed up.  Strongly encouraged her to do so as this could be multifactorial in the setting of MS.  Will follow.

## 2013-10-14 NOTE — Assessment & Plan Note (Signed)
New.  Severe and consistent w/ rotator cuff pathology.  Refer back to ortho.  Start daily NSAID to improve pain and inflammation.  Will follow.

## 2013-10-14 NOTE — Assessment & Plan Note (Signed)
New.  Pt initially not interested in workup and just wanted Zofran.  Convinced her to check labs and determine if there was an underlying cause for her symptoms.  Pt repeatedly denies reflux sxs.  Will follow.

## 2013-10-14 NOTE — Assessment & Plan Note (Signed)
Refill on alprazolam provided.

## 2013-10-16 ENCOUNTER — Ambulatory Visit: Payer: 59 | Admitting: Physical Therapy

## 2013-10-22 ENCOUNTER — Encounter: Payer: 59 | Admitting: Physical Therapy

## 2013-10-23 ENCOUNTER — Ambulatory Visit: Payer: 59 | Attending: Oncology

## 2013-10-23 DIAGNOSIS — M25569 Pain in unspecified knee: Secondary | ICD-10-CM | POA: Diagnosis not present

## 2013-10-23 DIAGNOSIS — IMO0001 Reserved for inherently not codable concepts without codable children: Secondary | ICD-10-CM | POA: Insufficient documentation

## 2013-10-23 DIAGNOSIS — M24519 Contracture, unspecified shoulder: Secondary | ICD-10-CM | POA: Insufficient documentation

## 2013-10-23 DIAGNOSIS — R293 Abnormal posture: Secondary | ICD-10-CM | POA: Insufficient documentation

## 2013-10-23 DIAGNOSIS — M25519 Pain in unspecified shoulder: Secondary | ICD-10-CM | POA: Diagnosis not present

## 2013-10-24 NOTE — Progress Notes (Signed)
Faxed rehab referral for PT dtd 10/24/13.  Original sent to scan.

## 2013-10-28 ENCOUNTER — Ambulatory Visit: Payer: 59 | Admitting: Physical Therapy

## 2013-10-29 ENCOUNTER — Encounter: Payer: Self-pay | Admitting: Radiation Oncology

## 2013-10-29 NOTE — Progress Notes (Signed)
Rec'd request from [employer] [facility] for medical records 05/30/2012 to present.  Printed out notes and put in purple folder to give to physician for review.

## 2013-10-30 ENCOUNTER — Ambulatory Visit: Payer: 59 | Admitting: Physical Therapy

## 2013-10-30 ENCOUNTER — Encounter: Payer: 59 | Admitting: Physical Therapy

## 2013-10-30 DIAGNOSIS — IMO0001 Reserved for inherently not codable concepts without codable children: Secondary | ICD-10-CM | POA: Diagnosis not present

## 2013-11-05 ENCOUNTER — Ambulatory Visit: Payer: 59 | Admitting: Physical Therapy

## 2013-11-06 ENCOUNTER — Ambulatory Visit: Payer: 59

## 2013-11-07 ENCOUNTER — Ambulatory Visit: Payer: 59 | Admitting: Physical Therapy

## 2013-11-07 DIAGNOSIS — IMO0001 Reserved for inherently not codable concepts without codable children: Secondary | ICD-10-CM | POA: Diagnosis not present

## 2013-11-12 ENCOUNTER — Ambulatory Visit: Payer: 59 | Admitting: Physical Therapy

## 2013-11-13 ENCOUNTER — Ambulatory Visit: Payer: 59 | Admitting: Physical Therapy

## 2013-11-14 ENCOUNTER — Ambulatory Visit: Payer: 59 | Admitting: Physical Therapy

## 2013-11-18 ENCOUNTER — Ambulatory Visit: Payer: 59 | Attending: Oncology

## 2013-11-18 DIAGNOSIS — R293 Abnormal posture: Secondary | ICD-10-CM | POA: Insufficient documentation

## 2013-11-18 DIAGNOSIS — M25569 Pain in unspecified knee: Secondary | ICD-10-CM | POA: Insufficient documentation

## 2013-11-18 DIAGNOSIS — IMO0001 Reserved for inherently not codable concepts without codable children: Secondary | ICD-10-CM | POA: Insufficient documentation

## 2013-11-18 DIAGNOSIS — M24519 Contracture, unspecified shoulder: Secondary | ICD-10-CM | POA: Insufficient documentation

## 2013-11-18 DIAGNOSIS — M25519 Pain in unspecified shoulder: Secondary | ICD-10-CM | POA: Insufficient documentation

## 2013-11-20 ENCOUNTER — Ambulatory Visit: Payer: 59

## 2013-11-21 ENCOUNTER — Ambulatory Visit: Payer: 59 | Admitting: Physical Therapy

## 2013-11-23 ENCOUNTER — Other Ambulatory Visit: Payer: Self-pay | Admitting: Family Medicine

## 2013-11-25 ENCOUNTER — Ambulatory Visit: Payer: 59

## 2013-11-25 ENCOUNTER — Encounter: Payer: Self-pay | Admitting: Adult Health

## 2013-11-25 NOTE — Telephone Encounter (Signed)
Med filled.  

## 2013-11-25 NOTE — Progress Notes (Signed)
Last herpctin (2014) insurance was paying. No asst needed.

## 2013-11-27 ENCOUNTER — Encounter: Payer: Self-pay | Admitting: Radiation Oncology

## 2013-11-27 NOTE — Progress Notes (Signed)
11/27/13:  Faxed 20 pages to Gwenyth Bouillon at [facility] (ph [phone], fax [phone]).  Requested information from 05/30/12-present - faxed weekly treatment mgmt notes: 05/31/12, 06/05/12, end of treatment note 06/06/12, follow up visit notes: 07/19/12, 01/17/13.  Release form and request form scanned.

## 2013-11-29 ENCOUNTER — Other Ambulatory Visit: Payer: Self-pay | Admitting: Family Medicine

## 2013-11-29 NOTE — Telephone Encounter (Signed)
Med filled.  

## 2013-12-17 ENCOUNTER — Telehealth: Payer: Self-pay | Admitting: Hematology and Oncology

## 2013-12-17 NOTE — Telephone Encounter (Signed)
, °

## 2013-12-24 ENCOUNTER — Other Ambulatory Visit: Payer: Self-pay | Admitting: Internal Medicine

## 2013-12-27 ENCOUNTER — Other Ambulatory Visit: Payer: Self-pay

## 2013-12-27 ENCOUNTER — Ambulatory Visit: Payer: 59 | Admitting: Oncology

## 2013-12-27 ENCOUNTER — Other Ambulatory Visit: Payer: 59

## 2013-12-27 DIAGNOSIS — Z853 Personal history of malignant neoplasm of breast: Secondary | ICD-10-CM

## 2013-12-27 DIAGNOSIS — Z9012 Acquired absence of left breast and nipple: Secondary | ICD-10-CM

## 2013-12-29 ENCOUNTER — Other Ambulatory Visit: Payer: Self-pay | Admitting: Adult Health

## 2014-01-06 ENCOUNTER — Ambulatory Visit: Payer: 59

## 2014-01-14 ENCOUNTER — Ambulatory Visit: Payer: 59

## 2014-01-15 ENCOUNTER — Other Ambulatory Visit: Payer: Self-pay | Admitting: Family Medicine

## 2014-01-16 NOTE — Telephone Encounter (Signed)
Med filled.  

## 2014-01-17 ENCOUNTER — Telehealth: Payer: Self-pay | Admitting: *Deleted

## 2014-01-17 NOTE — Telephone Encounter (Signed)
Called the pt and informed her that we received Disability Form from [employer],and Dr. Birdie Riddle review the forms and stated that she will need an appt to discuss.  Unless she wants Oncology or Neuro to complete.  Pt stated that her Neuro has completed the forms, and she did not know that the forms was sent to Dr. Birdie Riddle.  Asked the pt if she want me to shed the paperwork and she stated yes shed the paperwork.//AB/CMA

## 2014-01-23 ENCOUNTER — Ambulatory Visit: Payer: 59

## 2014-01-24 ENCOUNTER — Other Ambulatory Visit: Payer: Self-pay | Admitting: Adult Health

## 2014-01-31 ENCOUNTER — Telehealth: Payer: Self-pay | Admitting: Hematology and Oncology

## 2014-01-31 NOTE — Telephone Encounter (Signed)
call day 8/25 - moved appts to am - s/w pt she is aware.

## 2014-02-05 ENCOUNTER — Ambulatory Visit
Admission: RE | Admit: 2014-02-05 | Discharge: 2014-02-05 | Disposition: A | Discharge status: Home / Self Care | Payer: 59 | Source: Ambulatory Visit

## 2014-02-05 ENCOUNTER — Other Ambulatory Visit: Payer: Self-pay

## 2014-02-05 DIAGNOSIS — Z9012 Acquired absence of left breast and nipple: Secondary | ICD-10-CM

## 2014-02-05 DIAGNOSIS — Z1231 Encounter for screening mammogram for malignant neoplasm of breast: Secondary | ICD-10-CM

## 2014-02-05 DIAGNOSIS — Z853 Personal history of malignant neoplasm of breast: Secondary | ICD-10-CM

## 2014-02-10 ENCOUNTER — Other Ambulatory Visit: Payer: Self-pay | Admitting: *Deleted

## 2014-02-10 DIAGNOSIS — C50419 Malignant neoplasm of upper-outer quadrant of unspecified female breast: Secondary | ICD-10-CM

## 2014-02-11 ENCOUNTER — Ambulatory Visit (HOSPITAL_BASED_OUTPATIENT_CLINIC_OR_DEPARTMENT_OTHER): Payer: 59 | Admitting: Hematology and Oncology

## 2014-02-11 ENCOUNTER — Other Ambulatory Visit (HOSPITAL_BASED_OUTPATIENT_CLINIC_OR_DEPARTMENT_OTHER): Payer: 59

## 2014-02-11 ENCOUNTER — Encounter: Payer: Self-pay | Admitting: Hematology and Oncology

## 2014-02-11 ENCOUNTER — Telehealth: Payer: Self-pay | Admitting: Hematology and Oncology

## 2014-02-11 VITALS — BP 125/69 | HR 66 | Temp 97.9°F | Resp 18 | Ht 66.0 in | Wt 211.4 lb

## 2014-02-11 DIAGNOSIS — G35 Multiple sclerosis: Secondary | ICD-10-CM

## 2014-02-11 DIAGNOSIS — C50419 Malignant neoplasm of upper-outer quadrant of unspecified female breast: Secondary | ICD-10-CM

## 2014-02-11 DIAGNOSIS — Z170 Estrogen receptor positive status [ER+]: Secondary | ICD-10-CM

## 2014-02-11 DIAGNOSIS — F341 Dysthymic disorder: Secondary | ICD-10-CM

## 2014-02-11 DIAGNOSIS — C50412 Malignant neoplasm of upper-outer quadrant of left female breast: Secondary | ICD-10-CM

## 2014-02-11 LAB — CBC WITH DIFFERENTIAL/PLATELET
BASO%: 1 % (ref 0.0–2.0)
Basophils Absolute: 0.1 10*3/uL (ref 0.0–0.1)
EOS%: 3.7 % (ref 0.0–7.0)
Eosinophils Absolute: 0.3 10*3/uL (ref 0.0–0.5)
HCT: 37.2 % (ref 34.8–46.6)
HGB: 12.4 g/dL (ref 11.6–15.9)
LYMPH%: 28.9 % (ref 14.0–49.7)
MCH: 28.4 pg (ref 25.1–34.0)
MCHC: 33.4 g/dL (ref 31.5–36.0)
MCV: 85.1 fL (ref 79.5–101.0)
MONO#: 0.4 10*3/uL (ref 0.1–0.9)
MONO%: 5.5 % (ref 0.0–14.0)
NEUT#: 4.2 10*3/uL (ref 1.5–6.5)
NEUT%: 60.9 % (ref 38.4–76.8)
Platelets: 228 10*3/uL (ref 145–400)
RBC: 4.38 10*6/uL (ref 3.70–5.45)
RDW: 13.3 % (ref 11.2–14.5)
WBC: 6.9 10*3/uL (ref 3.9–10.3)
lymph#: 2 10*3/uL (ref 0.9–3.3)

## 2014-02-11 LAB — COMPREHENSIVE METABOLIC PANEL (CC13)
ALT: 12 U/L (ref 0–55)
AST: 13 U/L (ref 5–34)
Albumin: 3.8 g/dL (ref 3.5–5.0)
Alkaline Phosphatase: 168 U/L — ABNORMAL HIGH (ref 40–150)
Anion Gap: 13 mEq/L — ABNORMAL HIGH (ref 3–11)
BUN: 21.2 mg/dL (ref 7.0–26.0)
CO2: 23 mEq/L (ref 22–29)
Calcium: 9.2 mg/dL (ref 8.4–10.4)
Chloride: 99 mEq/L (ref 98–109)
Creatinine: 1.3 mg/dL — ABNORMAL HIGH (ref 0.6–1.1)
Glucose: 140 mg/dl (ref 70–140)
Potassium: 3.1 mEq/L — ABNORMAL LOW (ref 3.5–5.1)
Sodium: 135 mEq/L — ABNORMAL LOW (ref 136–145)
Total Bilirubin: 0.7 mg/dL (ref 0.20–1.20)
Total Protein: 7 g/dL (ref 6.4–8.3)

## 2014-02-11 NOTE — Progress Notes (Signed)
Patient Care Team: Midge Minium, MD as PCP - General (Family Medicine)  DIAGNOSIS: Cancer of upper-outer quadrant of female breast   Primary site: Breast (Left)   Staging method: AJCC 7th Edition   Clinical: Stage 0 (Tis (DCIS), N0, cM0)   Pathologic: Stage IIA (T1b, N1a, cM0) signed by Haywood Lasso, MD on 12/29/2011  2:04 PM   Summary: Stage IIA (T1b, N1a, cM0)   Clinical comments: Staged in breast conference 6.12.13   Prognostic indicators:      ER equals 100%      PR equals 100%      Ki-67 equals 11%      HER-2/neu equals 3.17, positive   SUMMARY OF ONCOLOGIC HISTORY:   Cancer of upper-outer quadrant of female breast   11/22/2011 Initial Diagnosis Cancer of upper-outer quadrant of female breast   12/19/2011 Surgery Left mastectomy with SLN biopsy 2 foci of invasive ductal carcinoma 0.8 and 0.6 cm low-grade ER PR positive HER-2 positive ratio 3.27 Ki-67 11% one lymph node positive out of 4 (T1, N1, M0 stage II)   02/16/2012 - 04/14/2013 Chemotherapy Taxotere, carboplatin, Herceptin x2 cycles complicated by bacteremia and left knee sepsis requiring revision of the knee. Herceptin maintenance every 3 weeks. Herceptin maintenance completed 04/14/2013   04/17/2012 - 06/04/2012 Radiation Therapy Radiation therapy to the breast   03/13/2013 -  Anti-estrogen oral therapy Letrozole 2.5 mg once daily    CHIEF COMPLIANT: Left-sided chest wall discomfort  INTERVAL HISTORY: Julie Ross is a 53 year old Caucasian lady with above-mentioned history of breast cancer. She is currently on antiestrogen therapy with letrozole and is tolerating it very well without any major problems. She was diagnosed with multiple sclerosis which is causing her some fatigue and body aches and pains. She is debating different treatment options for MS and depression and anxiety are reasonably well controlled on current medications. She continues to have memory problems. Denies any lumps or nodules. She recent mammogram  and is here today to discuss the results.   REVIEW OF SYSTEMS:   Constitutional: Denies fevers, chills or abnormal weight loss Eyes: Denies blurriness of vision Ears, nose, mouth, throat, and face: Denies mucositis or sore throat Respiratory: Denies cough, dyspnea or wheezes Cardiovascular: Denies palpitation, chest discomfort or lower extremity swelling Gastrointestinal:  Denies nausea, heartburn or change in bowel habits Skin: Denies abnormal skin rashes Lymphatics: Denies new lymphadenopathy or easy bruising Neurological:Denies numbness, tingling or new weaknesses Behavioral/Psych: Mood is stable, no new changes  Breast: denies any pain or lumps or nodules in  right breast All other systems were reviewed with the patient and are negative.  I have reviewed the past medical history, past surgical history, social history and family history with the patient and they are unchanged from previous note.  ALLERGIES:  has No Known Allergies.  MEDICATIONS:  Current Outpatient Prescriptions  Medication Sig Dispense Refill  . ALPRAZolam (XANAX) 0.5 MG tablet Take 1 tablet (0.5 mg total) by mouth at bedtime.  30 tablet  3  . amLODipine (NORVASC) 10 MG tablet Take 10 mg by mouth daily before breakfast.       . atenolol (TENORMIN) 25 MG tablet Take 0.5 tablets (12.5 mg total) by mouth daily before breakfast. For blood pressure  30 tablet  6  . baclofen (LIORESAL) 10 MG tablet Take 10 mg by mouth 3 (three) times daily.       Marland Kitchen buPROPion (WELLBUTRIN XL) 150 MG 24 hr tablet Take 1 tablet (150 mg total) by mouth  daily.  30 tablet  6  . CALCIUM-MAGNESIUM PO Take 1 tablet by mouth daily.      Marland Kitchen docusate sodium (COLACE) 50 MG capsule Take 100 mg by mouth at bedtime.      . DULoxetine (CYMBALTA) 60 MG capsule TAKE ONE CAPSULE BY MOUTH DAILY  90 capsule  0  . gabapentin (NEURONTIN) 300 MG capsule Take 300 mg by mouth 2 (two) times daily.       Marland Kitchen letrozole (FEMARA) 2.5 MG tablet TAKE 1 TABLET BY MOUTH  EVERY DAY  30 tablet  0  . losartan-hydrochlorothiazide (HYZAAR) 100-25 MG per tablet TAKE 1 TABLET BY MOUTH EVERY MORNING BEFORE BREAKFAST  90 tablet  6  . meloxicam (MOBIC) 15 MG tablet TAKE 1 TABLET BY MOUTH EVERY DAY  30 tablet  6  . methocarbamol (ROBAXIN) 500 MG tablet Take 500 mg by mouth every 6 (six) hours as needed for muscle spasms.      . Multiple Vitamins-Minerals (MULTIVITAMINS THER. W/MINERALS) TABS Take 1 tablet by mouth daily.      Marland Kitchen omeprazole (PRILOSEC) 40 MG capsule Take 40 mg by mouth daily.      Marland Kitchen oxyCODONE-acetaminophen (PERCOCET) 10-325 MG per tablet Take 1 tablet by mouth every 4 (four) hours as needed for pain.  60 tablet  0  . Probiotic Product (PROBIOTIC DAILY PO) Take 1 capsule by mouth daily.       . vitamin E 400 UNIT capsule Take 800 Units by mouth daily.       No current facility-administered medications for this visit.   Facility-Administered Medications Ordered in Other Visits  Medication Dose Route Frequency Provider Last Rate Last Dose  . ceFAZolin (ANCEF) IVPB 2 g/50 mL premix    PRN Tawni Millers, CRNA   2 g at 04/19/13 0956  . dexamethasone (DECADRON) injection    PRN Tawni Millers, CRNA   8 mg at 05/01/12 0908  . fentaNYL (SUBLIMAZE) injection    PRN Tawni Millers, CRNA   100 mcg at 05/01/12 0856  . lidocaine (cardiac) 100 mg/39m (XYLOCAINE) 20 MG/ML injection 2%    PRN WTawni Millers CRNA   60 mg at 05/01/12 0856  . midazolam (VERSED) 5 MG/5ML injection    PRN WTawni Millers CRNA   2 mg at 05/01/12 0856  . propofol (DIPRIVAN) 10 mg/mL bolus/IV push    PRN WTawni Millers CRNA   160 mg at 05/01/12 0900    PHYSICAL EXAMINATION: ECOG PERFORMANCE STATUS: 1 - Symptomatic but completely ambulatory  Filed Vitals:   02/11/14 0930  BP: 125/69  Pulse: 66  Temp: 97.9 F (36.6 C)  Resp: 18   Filed Weights   02/11/14 0930  Weight: 211 lb 6.4 oz (95.89 kg)    GENERAL:alert, no distress and comfortable SKIN: skin color, texture, turgor are  normal, no rashes or significant lesions EYES: normal, Conjunctiva are pink and non-injected, sclera clear OROPHARYNX:no exudate, no erythema and lips, buccal mucosa, and tongue normal  NECK: supple, thyroid normal size, non-tender, without nodularity LYMPH:  no palpable lymphadenopathy in the cervical, axillary or inguinal LUNGS: clear to auscultation and percussion with normal breathing effort HEART: regular rate & rhythm and no murmurs and no lower extremity edema ABDOMEN:abdomen soft, non-tender and normal bowel sounds Musculoskeletal:no cyanosis of digits and no clubbing  NEURO: alert & oriented x 3 with fluent speech, no focal motor/sensory deficits BREAST: No palpable masses lungs or nodules in right breast. No palpable axillary supraclavicular or infraclavicular adenopathy no breast tenderness  or nipple discharge.   LABORATORY DATA:  I have reviewed the data as listed Appointment on 02/11/2014  Component Date Value Ref Range Status  . WBC 02/11/2014 6.9  3.9 - 10.3 10e3/uL Final  . NEUT# 02/11/2014 4.2  1.5 - 6.5 10e3/uL Final  . HGB 02/11/2014 12.4  11.6 - 15.9 g/dL Final  . HCT 02/11/2014 37.2  34.8 - 46.6 % Final  . Platelets 02/11/2014 228  145 - 400 10e3/uL Final  . MCV 02/11/2014 85.1  79.5 - 101.0 fL Final  . MCH 02/11/2014 28.4  25.1 - 34.0 pg Final  . MCHC 02/11/2014 33.4  31.5 - 36.0 g/dL Final  . RBC 02/11/2014 4.38  3.70 - 5.45 10e6/uL Final  . RDW 02/11/2014 13.3  11.2 - 14.5 % Final  . lymph# 02/11/2014 2.0  0.9 - 3.3 10e3/uL Final  . MONO# 02/11/2014 0.4  0.1 - 0.9 10e3/uL Final  . Eosinophils Absolute 02/11/2014 0.3  0.0 - 0.5 10e3/uL Final  . Basophils Absolute 02/11/2014 0.1  0.0 - 0.1 10e3/uL Final  . NEUT% 02/11/2014 60.9  38.4 - 76.8 % Final  . LYMPH% 02/11/2014 28.9  14.0 - 49.7 % Final  . MONO% 02/11/2014 5.5  0.0 - 14.0 % Final  . EOS% 02/11/2014 3.7  0.0 - 7.0 % Final  . BASO% 02/11/2014 1.0  0.0 - 2.0 % Final  . Sodium 02/11/2014 135* 136 - 145  mEq/L Final  . Potassium 02/11/2014 3.1* 3.5 - 5.1 mEq/L Final  . Chloride 02/11/2014 99  98 - 109 mEq/L Final  . CO2 02/11/2014 23  22 - 29 mEq/L Final  . Glucose 02/11/2014 140  70 - 140 mg/dl Final  . BUN 02/11/2014 21.2  7.0 - 26.0 mg/dL Final  . Creatinine 02/11/2014 1.3* 0.6 - 1.1 mg/dL Final  . Total Bilirubin 02/11/2014 0.70  0.20 - 1.20 mg/dL Final  . Alkaline Phosphatase 02/11/2014 168* 40 - 150 U/L Final  . AST 02/11/2014 13  5 - 34 U/L Final  . ALT 02/11/2014 12  0 - 55 U/L Final  . Total Protein 02/11/2014 7.0  6.4 - 8.3 g/dL Final  . Albumin 02/11/2014 3.8  3.5 - 5.0 g/dL Final  . Calcium 02/11/2014 9.2  8.4 - 10.4 mg/dL Final  . Anion Gap 02/11/2014 13* 3 - 11 mEq/L Final    RADIOGRAPHIC STUDIES: Mammograms done in 02/06/2014: Right breast no lumps or nodules one-year followup recommended No results found.   ASSESSMENT & PLAN:  Cancer of upper-outer quadrant of female breast Left breast invasive ductal carcinoma with DCIS ER/PR positive HER-2 positive: Status post mastectomy, adjuvant chemotherapy in 1 year of Herceptin. Currently on letrozole adjuvant hormonal therapy. Tolerating it very well without any major problems. I reviewed her mammogram results which were normal on the right breast. Physical examination today did not reveal any abnormalities. I recommended a six-month followup for another physical exam and evaluation on aromatase inhibitor therapy.  Patient will need DEXA scan in one year along with a right breast mammogram. Patient gets annual Pap smears in August of every year.  Survivorship: I encouraged her to continue to take calcium and vitamin D and do physical exercise. Encouraged her to a more fruits and vegetables and decrease red meat. I also encouraged to continue her screenings for colon cancer as recommended by her PCP.  Multiple sclerosis: Causing fatigue and body pains.  Anxiety/depression: On Wellbutrin and Cymbalta. No orders of the defined  types were placed in this encounter.  The patient has a good understanding of the overall plan. she agrees with it. She will call with any problems that may develop before her next visit here.  I spent 25 minutes counseling the patient face to face. The total time spent in the appointment was 30 minutes and more than 50% was on counseling and review of test results    Rulon Eisenmenger, MD 02/11/2014 10:06 AM

## 2014-02-11 NOTE — Assessment & Plan Note (Signed)
Left breast invasive ductal carcinoma with DCIS ER/PR positive HER-2 positive: Status post mastectomy, adjuvant chemotherapy in 1 year of Herceptin. Currently on letrozole adjuvant hormonal therapy. Tolerating it very well without any major problems. I reviewed her mammogram results which were normal on the right breast. Physical examination today did not reveal any abnormalities. I recommended a six-month followup for another physical exam and evaluation on aromatase inhibitor therapy.  Patient will need DEXA scan in one year along with a right breast mammogram. Patient gets annual Pap smears in August of every year.  Survivorship: I encouraged her to continue to take calcium and vitamin D and do physical exercise. Encouraged her to a more fruits and vegetables and decrease red meat. I also encouraged to continue her screenings for colon cancer as recommended by her PCP.  Multiple sclerosis: Causing fatigue and body pains.

## 2014-02-11 NOTE — Telephone Encounter (Signed)
per pof to sch pt appt-gave pt copy of sch °

## 2014-02-17 ENCOUNTER — Telehealth: Payer: Self-pay | Admitting: Family Medicine

## 2014-02-17 ENCOUNTER — Other Ambulatory Visit: Payer: Self-pay | Admitting: General Practice

## 2014-02-17 NOTE — Telephone Encounter (Signed)
Received paperwork for PA on [employer], forward to nurse

## 2014-02-17 NOTE — Telephone Encounter (Signed)
Spoke with pt and she advised that she does not want this medication. Does not use this.

## 2014-02-20 ENCOUNTER — Other Ambulatory Visit: Payer: Self-pay | Admitting: Adult Health

## 2014-02-20 DIAGNOSIS — C50412 Malignant neoplasm of upper-outer quadrant of left female breast: Secondary | ICD-10-CM

## 2014-03-09 ENCOUNTER — Other Ambulatory Visit: Payer: Self-pay | Admitting: Family Medicine

## 2014-03-10 NOTE — Telephone Encounter (Signed)
Med filled.  

## 2014-04-03 ENCOUNTER — Other Ambulatory Visit: Payer: Self-pay | Admitting: Family Medicine

## 2014-04-03 NOTE — Telephone Encounter (Signed)
Med filled.  

## 2014-05-05 ENCOUNTER — Other Ambulatory Visit: Payer: Self-pay | Admitting: Family Medicine

## 2014-05-06 NOTE — Telephone Encounter (Signed)
Med filled.  

## 2014-07-03 ENCOUNTER — Encounter (HOSPITAL_COMMUNITY): Payer: Self-pay | Admitting: Orthopedic Surgery

## 2014-07-04 ENCOUNTER — Other Ambulatory Visit: Payer: Self-pay | Admitting: Cardiology

## 2014-07-04 ENCOUNTER — Other Ambulatory Visit: Payer: Self-pay | Admitting: Family Medicine

## 2014-07-07 NOTE — Telephone Encounter (Signed)
Med filled, letter mailed to pt to make an appt.

## 2014-08-07 ENCOUNTER — Other Ambulatory Visit: Payer: Self-pay | Admitting: Family Medicine

## 2014-08-07 NOTE — Telephone Encounter (Signed)
Med filled and letter mailed to pt to schedule a complete physical.

## 2014-08-08 ENCOUNTER — Ambulatory Visit: Payer: Self-pay | Admitting: Family Medicine

## 2014-08-12 ENCOUNTER — Ambulatory Visit (INDEPENDENT_AMBULATORY_CARE_PROVIDER_SITE_OTHER): Payer: 59 | Admitting: Family Medicine

## 2014-08-12 ENCOUNTER — Encounter: Payer: Self-pay | Admitting: Family Medicine

## 2014-08-12 VITALS — BP 126/74 | HR 60 | Temp 98.1°F | Resp 16 | Wt 211.1 lb

## 2014-08-12 DIAGNOSIS — F329 Major depressive disorder, single episode, unspecified: Secondary | ICD-10-CM

## 2014-08-12 DIAGNOSIS — G35 Multiple sclerosis: Secondary | ICD-10-CM

## 2014-08-12 DIAGNOSIS — K219 Gastro-esophageal reflux disease without esophagitis: Secondary | ICD-10-CM

## 2014-08-12 DIAGNOSIS — J0100 Acute maxillary sinusitis, unspecified: Secondary | ICD-10-CM

## 2014-08-12 DIAGNOSIS — F32A Depression, unspecified: Secondary | ICD-10-CM

## 2014-08-12 MED ORDER — AMOXICILLIN 875 MG PO TABS: 875.0000 mg | ORAL_TABLET | Freq: Two times a day (BID) | ORAL | Status: DC

## 2014-08-12 MED ORDER — OMEPRAZOLE 40 MG PO CPDR: 40.0000 mg | DELAYED_RELEASE_CAPSULE | Freq: Every day | ORAL | Status: DC

## 2014-08-12 MED ORDER — EPINEPHRINE 0.3 MG/0.3ML IJ SOAJ: 0.3000 mg | Freq: Once | INTRAMUSCULAR | Status: DC

## 2014-08-12 MED ORDER — ALPRAZOLAM 0.5 MG PO TABS: 0.5000 mg | ORAL_TABLET | Freq: Every day | ORAL | Status: DC

## 2014-08-12 NOTE — Assessment & Plan Note (Signed)
Deteriorated since pt stopped her omeprazole.  Suspect this is the cause of her LUQ pain- particularly in setting of increased stress.  Restart PPI.  Will follow.

## 2014-08-12 NOTE — Patient Instructions (Signed)
Follow up as needed Start the Amoxicillin twice daily- take w/ food Restart the Omeprazole daily to decrease acid production and nausea Drink plenty of fluids Use the Alprazolam as needed REST! Try and find a stress outlet if you are able- you've earned it! Call with any questions or concerns Hang in there!!!

## 2014-08-12 NOTE — Assessment & Plan Note (Signed)
New.  Pt's sxs and PE consistent w/ infxn.  Start abx.  Reviewed supportive care and red flags that should prompt return.  Pt expressed understanding and is in agreement w/ plan.  

## 2014-08-12 NOTE — Progress Notes (Signed)
Pre visit review using our clinic review tool, if applicable. No additional management support is needed unless otherwise documented below in the visit note. 

## 2014-08-12 NOTE — Progress Notes (Signed)
   Subjective:    Patient ID: Julie Ross, female    DOB: [birth year], 54 y.o.   MRN: [id number]  HPI URI- had nausea since last week.  + nasal congestion, dryness, PND.  + maxillary pain, HA.  No ear pain.  No fevers.  + LUQ pain- is out of prilosec.  No diarrhea.  No known sick contacts.    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  She is driving an hour roundtrip almost daily to care for mother which is causing increased stress.  She is out of xanax.  Asking for refill.   Review of Systems For ROS see HPI     Objective:   Physical Exam  Constitutional: She is oriented to person, place, and time. She appears well-developed and well-nourished. No distress.  HENT:  Head: Normocephalic and atraumatic.  Right Ear: Tympanic membrane normal.  Left Ear: Tympanic membrane normal.  Nose: Mucosal edema and rhinorrhea present. Right sinus exhibits maxillary sinus tenderness. Right sinus exhibits no frontal sinus tenderness. Left sinus exhibits maxillary sinus tenderness. Left sinus exhibits no frontal sinus tenderness.  Mouth/Throat: Uvula is midline and mucous membranes are normal. Posterior oropharyngeal erythema present. No oropharyngeal exudate.  Eyes: Conjunctivae and EOM are normal. Pupils are equal, round, and reactive to light.  Neck: Normal range of motion. Neck supple.  Cardiovascular: Normal rate, regular rhythm and normal heart sounds.   Pulmonary/Chest: Effort normal and breath sounds normal. No respiratory distress. She has no wheezes.  Abdominal: Soft. Bowel sounds are normal. She exhibits no distension. There is no tenderness. There is no rebound and no guarding.  Lymphadenopathy:    She has no cervical adenopathy.  Neurological: She is alert and oriented to person, place, and time.  Skin: Skin is warm and dry.  Psychiatric: She has a normal mood and affect. Her behavior is normal. Thought content normal.  Vitals reviewed.        Assessment & Plan:

## 2014-08-12 NOTE — Assessment & Plan Note (Signed)
Deteriorated.  Pt now w/ family stressors.  Will refill xanax.  Discussed adjusting or increasing meds but pt wants to hold on this for now.  Will continue to follow.

## 2014-08-18 ENCOUNTER — Telehealth: Payer: Self-pay | Admitting: Hematology and Oncology

## 2014-08-18 ENCOUNTER — Ambulatory Visit (HOSPITAL_BASED_OUTPATIENT_CLINIC_OR_DEPARTMENT_OTHER): Payer: 59 | Admitting: Hematology and Oncology

## 2014-08-18 ENCOUNTER — Ambulatory Visit (HOSPITAL_BASED_OUTPATIENT_CLINIC_OR_DEPARTMENT_OTHER): Payer: 59

## 2014-08-18 VITALS — BP 128/59 | HR 75 | Temp 98.8°F | Resp 18 | Ht 66.0 in | Wt 211.6 lb

## 2014-08-18 DIAGNOSIS — Z170 Estrogen receptor positive status [ER+]: Secondary | ICD-10-CM

## 2014-08-18 DIAGNOSIS — C50411 Malignant neoplasm of upper-outer quadrant of right female breast: Secondary | ICD-10-CM

## 2014-08-18 DIAGNOSIS — R5383 Other fatigue: Secondary | ICD-10-CM

## 2014-08-18 LAB — CBC WITH DIFFERENTIAL/PLATELET
BASO%: 0.9 % (ref 0.0–2.0)
Basophils Absolute: 0.1 10*3/uL (ref 0.0–0.1)
EOS%: 2 % (ref 0.0–7.0)
Eosinophils Absolute: 0.1 10*3/uL (ref 0.0–0.5)
HCT: 38.2 % (ref 34.8–46.6)
HGB: 12.5 g/dL (ref 11.6–15.9)
LYMPH%: 26.6 % (ref 14.0–49.7)
MCH: 27.5 pg (ref 25.1–34.0)
MCHC: 32.7 g/dL (ref 31.5–36.0)
MCV: 84.2 fL (ref 79.5–101.0)
MONO#: 0.5 10*3/uL (ref 0.1–0.9)
MONO%: 6.2 % (ref 0.0–14.0)
NEUT#: 4.7 10*3/uL (ref 1.5–6.5)
NEUT%: 64.3 % (ref 38.4–76.8)
Platelets: 267 10*3/uL (ref 145–400)
RBC: 4.53 10*6/uL (ref 3.70–5.45)
RDW: 13.3 % (ref 11.2–14.5)
WBC: 7.3 10*3/uL (ref 3.9–10.3)
lymph#: 1.9 10*3/uL (ref 0.9–3.3)

## 2014-08-18 LAB — COMPREHENSIVE METABOLIC PANEL (CC13)
ALT: 21 U/L (ref 0–55)
AST: 19 U/L (ref 5–34)
Albumin: 4 g/dL (ref 3.5–5.0)
Alkaline Phosphatase: 137 U/L (ref 40–150)
Anion Gap: 11 mEq/L (ref 3–11)
BUN: 17.2 mg/dL (ref 7.0–26.0)
CO2: 27 mEq/L (ref 22–29)
Calcium: 9.1 mg/dL (ref 8.4–10.4)
Chloride: 100 mEq/L (ref 98–109)
Creatinine: 1.3 mg/dL — ABNORMAL HIGH (ref 0.6–1.1)
EGFR: 44 mL/min/{1.73_m2} — ABNORMAL LOW (ref >90)
Glucose: 117 mg/dl (ref 70–140)
Potassium: 3.7 mEq/L (ref 3.5–5.1)
Sodium: 138 mEq/L (ref 136–145)
Total Bilirubin: 0.46 mg/dL (ref 0.20–1.20)
Total Protein: 7.1 g/dL (ref 6.4–8.3)

## 2014-08-18 NOTE — Telephone Encounter (Signed)
per pof to sch pt appt-gave pt copy of sch °

## 2014-08-18 NOTE — Progress Notes (Signed)
Patient Care Team: Midge Minium, MD as PCP - General (Family Medicine)  DIAGNOSIS: Breast cancer of upper-outer quadrant of right female breast   Staging form: Breast, AJCC 7th Edition     Clinical: Stage 0 (Tis (DCIS), N0, cM0) - Unsigned       Staging comments: Staged in breast conference 6.12.13        Prognostic indicators: ER equals 100%  PR equals 100%  Ki-67 equals 11%  HER-2/neu equals 3.17, positive      Pathologic: Stage IIA (T1b, N1a, cM0) - Signed by Haywood Lasso, MD on 12/29/2011       Prognostic indicators: ER equals 100%  PR equals 100%  Ki-67 equals 11%  HER-2/neu equals 3.17, positive    SUMMARY OF ONCOLOGIC HISTORY:   Breast cancer of upper-outer quadrant of right female breast   11/22/2011 Initial Diagnosis Cancer of upper-outer quadrant of female breast   12/19/2011 Surgery Left mastectomy with SLN biopsy 2 foci of invasive ductal carcinoma 0.8 and 0.6 cm low-grade ER PR positive HER-2 positive ratio 3.27 Ki-67 11% one lymph node positive out of 4 (T1, N1, M0 stage II)   02/16/2012 - 04/14/2013 Chemotherapy Taxotere, carboplatin, Herceptin x2 cycles complicated by bacteremia and left knee sepsis requiring revision of the knee. Herceptin maintenance every 3 weeks. Herceptin maintenance completed 04/14/2013   04/17/2012 - 06/04/2012 Radiation Therapy Radiation therapy to the breast   03/13/2013 -  Anti-estrogen oral therapy Letrozole 2.5 mg once daily    CHIEF COMPLIANT:  Follow-up of breast cancer  INTERVAL HISTORY: Julie Ross is a  54 year old lady with above-mentioned history of right-sided breast cancer treated with mastectomy followed by adjuvant chemotherapy with TCH  Followed by Herceptin maintenance and radiation. She is currently omeprazole tolerating it very well without any major problems or concerns. She has multiple sclerosis and is seeing a specialist who recommended treatment for MS but she does not want to take it because it can cause  leukopenia. She is very concerned that in view of her recent knee infections, leukopenia could lead to intractable infection and problems in the knee.  REVIEW OF SYSTEMS:   Constitutional: Denies fevers, chills or abnormal weight loss Eyes: Denies blurriness of vision Ears, nose, mouth, throat, and face: Denies mucositis or sore throat Respiratory: Denies cough, dyspnea or wheezes Cardiovascular: Denies palpitation, chest discomfort or lower extremity swelling Gastrointestinal:  Denies nausea, heartburn or change in bowel habits Skin: Denies abnormal skin rashes Lymphatics: Denies new lymphadenopathy or easy bruising Neurological:Denies numbness, tingling or new weaknesses Behavioral/Psych: Mood is stable, no new changes  Breast:  denies any pain or lumps or nodules in  Right breast , left breast mastectomy All other systems were reviewed with the patient and are negative.  I have reviewed the past medical history, past surgical history, social history and family history with the patient and they are unchanged from previous note.  ALLERGIES:  has No Known Allergies.  MEDICATIONS:  Current Outpatient Prescriptions  Medication Sig Dispense Refill  . ALPRAZolam (XANAX) 0.5 MG tablet Take 1 tablet (0.5 mg total) by mouth at bedtime. 30 tablet 3  . amLODipine (NORVASC) 10 MG tablet TAKE 1 TABLET BY MOUTH EVERY DAY 30 tablet 2  . amoxicillin (AMOXIL) 875 MG tablet Take 1 tablet (875 mg total) by mouth 2 (two) times daily. 20 tablet 0  . atenolol (TENORMIN) 25 MG tablet Take 0.5 tablets (12.5 mg total) by mouth daily before breakfast. For blood pressure 30 tablet 6  .  baclofen (LIORESAL) 10 MG tablet Take 10 mg by mouth 3 (three) times daily.     Marland Kitchen buPROPion (WELLBUTRIN XL) 150 MG 24 hr tablet TAKE 1 TABLET BY MOUTH DAILY 30 tablet 0  . CALCIUM-MAGNESIUM PO Take 1 tablet by mouth daily.    Marland Kitchen docusate sodium (COLACE) 50 MG capsule Take 100 mg by mouth at bedtime.    . DULoxetine (CYMBALTA) 60  MG capsule TAKE 1 CAPSULE BY MOUTH EVERY DAY 90 capsule 0  . gabapentin (NEURONTIN) 300 MG capsule Take 300 mg by mouth 2 (two) times daily.     Marland Kitchen letrozole (FEMARA) 2.5 MG tablet TAKE 1 TABLET BY MOUTH EVERY DAY 30 tablet 6  . losartan-hydrochlorothiazide (HYZAAR) 100-25 MG per tablet TAKE 1 TABLET BY MOUTH EVERY MORNING BEFORE BREAKFAST 30 tablet 0  . Multiple Vitamins-Minerals (MULTIVITAMINS THER. W/MINERALS) TABS Take 1 tablet by mouth daily.    Marland Kitchen omeprazole (PRILOSEC) 40 MG capsule Take 1 capsule (40 mg total) by mouth daily. 30 capsule 6  . oxyCODONE-acetaminophen (PERCOCET) 10-325 MG per tablet Take 1 tablet by mouth every 4 (four) hours as needed for pain. 60 tablet 0  . Probiotic Product (PROBIOTIC DAILY PO) Take 1 capsule by mouth daily.     . vitamin E 400 UNIT capsule Take 800 Units by mouth daily.    Marland Kitchen EPINEPHrine 0.3 mg/0.3 mL IJ SOAJ injection Inject 0.3 mLs (0.3 mg total) into the muscle once. (Patient not taking: Reported on 08/18/2014) 2 Device 2   No current facility-administered medications for this visit.   Facility-Administered Medications Ordered in Other Visits  Medication Dose Route Frequency Provider Last Rate Last Dose  . ceFAZolin (ANCEF) IVPB 2 g/50 mL premix    PRN Tawni Millers, CRNA   2 g at 04/19/13 0956  . dexamethasone (DECADRON) injection    PRN Tawni Millers, CRNA   8 mg at 05/01/12 0908  . fentaNYL (SUBLIMAZE) injection    PRN Tawni Millers, CRNA   100 mcg at 05/01/12 0856  . lidocaine (cardiac) 100 mg/98ml (XYLOCAINE) 20 MG/ML injection 2%    PRN Tawni Millers, CRNA   60 mg at 05/01/12 0856  . midazolam (VERSED) 5 MG/5ML injection    PRN Tawni Millers, CRNA   2 mg at 05/01/12 0856  . propofol (DIPRIVAN) 10 mg/mL bolus/IV push    PRN Tawni Millers, CRNA   160 mg at 05/01/12 0900    PHYSICAL EXAMINATION: ECOG PERFORMANCE STATUS: 1 - Symptomatic but completely ambulatory  Filed Vitals:   08/18/14 1417  BP: 128/59  Pulse: 75  Temp: 98.8 F (37.1  C)  Resp: 18   Filed Weights   08/18/14 1417  Weight: 211 lb 9.6 oz (95.981 kg)    GENERAL:alert, no distress and comfortable SKIN: skin color, texture, turgor are normal, no rashes or significant lesions EYES: normal, Conjunctiva are pink and non-injected, sclera clear OROPHARYNX:no exudate, no erythema and lips, buccal mucosa, and tongue normal  NECK: supple, thyroid normal size, non-tender, without nodularity LYMPH:  no palpable lymphadenopathy in the cervical, axillary or inguinal LUNGS: clear to auscultation and percussion with normal breathing effort HEART: regular rate & rhythm and no murmurs and no lower extremity edema ABDOMEN:abdomen soft, non-tender and normal bowel sounds Musculoskeletal:no cyanosis of digits and no clubbing  NEURO: alert & oriented x 3 with fluent speech, no focal motor/sensory deficits BREAST: No palpable masses or nodules in right breasts. Left chest wall is intact without any lymphadenopathy or nodularity. No palpable axillary supraclavicular  or infraclavicular adenopathy no breast tenderness or nipple discharge. (exam performed in the presence of a chaperone)  LABORATORY DATA:  I have reviewed the data as listed   Chemistry      Component Value Date/Time   NA 135* 02/11/2014 0903   NA 138 10/14/2013 0913   K 3.1* 02/11/2014 0903   K 3.4* 10/14/2013 0913   CL 99 10/14/2013 0913   CL 100 11/30/2012 1029   CO2 23 02/11/2014 0903   CO2 29 10/14/2013 0913   BUN 21.2 02/11/2014 0903   BUN 16 10/14/2013 0913   CREATININE 1.3* 02/11/2014 0903   CREATININE 1.2 10/14/2013 0913      Component Value Date/Time   CALCIUM 9.2 02/11/2014 0903   CALCIUM 9.5 10/14/2013 0913   ALKPHOS 168* 02/11/2014 0903   ALKPHOS 145* 10/14/2013 0913   AST 13 02/11/2014 0903   AST 22 10/14/2013 0913   ALT 12 02/11/2014 0903   ALT 19 10/14/2013 0913   BILITOT 0.70 02/11/2014 0903   BILITOT 0.4 10/14/2013 0913       Lab Results  Component Value Date   WBC 6.9  02/11/2014   HGB 12.4 02/11/2014   HCT 37.2 02/11/2014   MCV 85.1 02/11/2014   PLT 228 02/11/2014   NEUTROABS 4.2 02/11/2014     RADIOGRAPHIC STUDIES: I have personally reviewed the radiology reports and agreed with their findings.  Mammogram August 2015 normal  ASSESSMENT & PLAN:  Breast cancer of upper-outer quadrant of right female breast Left breast invasive ductal carcinoma with DCIS ER/PR positive HER-2 positive: Status post mastectomy, adjuvant chemotherapy in 1 year of Herceptin. Currently on letrozole adjuvant hormonal therapy.   Letrozole toxicities: 1.  No major hot flashes or myalgias.  Knee infection: patient has had multiple problems with her Left knee  And it was recently cleaned and repaired there she continues to have some achiness and pain in the knee.  Breast cancer surveillance: 1.  Breast exam done 08/18/2014 is normal 2.  Mammogram 02/06/2014 is normal  Multiple sclerosis: Causing fatigue and body pains.  Patient was offered a treatment for MS but she does not want to take it because one of the  Adverse effects was neutropenia. Because of her knee infection , she does not want to take any medication that would lower her white blood cell count.  Anxiety/depression: On Wellbutrin and Cymbalta. Hypokalemia : previously potassium is 3.1 in August 2015. I would like to recheck her potassium levels today. We also like to check her CBC in order to see if she is anemic because patient feels fatigued.  Return to clinic in 6 months for follow-up    Orders Placed This Encounter  Procedures  . CBC with Differential    Standing Status: Future     Number of Occurrences:      Standing Expiration Date: 08/18/2015  . Comprehensive metabolic panel (Cmet) - [facility]    Standing Status: Future     Number of Occurrences:      Standing Expiration Date: 08/18/2015  . CBC with Differential    Standing Status: Future     Number of Occurrences:      Standing Expiration Date:  08/18/2015  . Comprehensive metabolic panel (Cmet) - [facility]    Standing Status: Future     Number of Occurrences:      Standing Expiration Date: 08/18/2015   The patient has a good understanding of the overall plan. she agrees with it. She will call with any  problems that may develop before her next visit here.   Rulon Eisenmenger, MD

## 2014-08-18 NOTE — Assessment & Plan Note (Addendum)
Left breast invasive ductal carcinoma with DCIS ER/PR positive HER-2 positive: Status post mastectomy, adjuvant chemotherapy in 1 year of Herceptin. Currently on letrozole adjuvant hormonal therapy.   Letrozole toxicities: 1.  No major hot flashes or myalgias.  Knee infection: patient has had multiple problems with her Left knee  And it was recently cleaned and repaired there she continues to have some achiness and pain in the knee.  Breast cancer surveillance: 1.  Breast exam done 08/18/2014 is normal 2.  Mammogram 02/06/2014 is normal  Multiple sclerosis: Causing fatigue and body pains.  Patient was offered a treatment for MS but she does not want to take it because one of the  Adverse effects was neutropenia. Because of her knee infection , she does not want to take any medication that would lower her white blood cell count.  Anxiety/depression: On Wellbutrin and Cymbalta.   return to clinic in 6 months for follow-up 

## 2014-08-19 ENCOUNTER — Ambulatory Visit: Payer: 59 | Admitting: Neurology

## 2014-09-07 ENCOUNTER — Other Ambulatory Visit: Payer: Self-pay | Admitting: Family Medicine

## 2014-09-08 NOTE — Telephone Encounter (Signed)
Med filled.  

## 2014-09-22 ENCOUNTER — Ambulatory Visit: Payer: 59 | Admitting: Neurology

## 2014-09-22 ENCOUNTER — Other Ambulatory Visit: Payer: Self-pay | Admitting: Family Medicine

## 2014-09-22 NOTE — Telephone Encounter (Signed)
Med filled.  

## 2014-09-24 ENCOUNTER — Other Ambulatory Visit: Payer: Self-pay | Admitting: Family Medicine

## 2014-09-24 MED ORDER — DULOXETINE HCL 60 MG PO CPEP
60.0000 mg | ORAL_CAPSULE | Freq: Every day | ORAL | Status: DC
Start: 2014-09-24 — End: 2014-10-07

## 2014-09-24 NOTE — Telephone Encounter (Signed)
Med filled.  

## 2014-09-30 ENCOUNTER — Encounter: Payer: Self-pay | Admitting: Medical

## 2014-09-30 ENCOUNTER — Telehealth: Payer: Self-pay | Admitting: Family Medicine

## 2014-09-30 ENCOUNTER — Ambulatory Visit (INDEPENDENT_AMBULATORY_CARE_PROVIDER_SITE_OTHER): Payer: 59 | Admitting: Medical

## 2014-09-30 VITALS — BP 122/74 | HR 78 | Temp 98.5°F | Ht 66.0 in | Wt 212.4 lb

## 2014-09-30 DIAGNOSIS — R062 Wheezing: Secondary | ICD-10-CM | POA: Insufficient documentation

## 2014-09-30 DIAGNOSIS — J301 Allergic rhinitis due to pollen: Secondary | ICD-10-CM | POA: Diagnosis not present

## 2014-09-30 DIAGNOSIS — J0100 Acute maxillary sinusitis, unspecified: Secondary | ICD-10-CM

## 2014-09-30 DIAGNOSIS — J309 Allergic rhinitis, unspecified: Secondary | ICD-10-CM | POA: Insufficient documentation

## 2014-09-30 MED ORDER — HYDROCODONE-HOMATROPINE 5-1.5 MG/5ML PO SYRP
5.0000 mL | ORAL_SOLUTION | Freq: Three times a day (TID) | ORAL | Status: DC | PRN
Start: 2014-09-30 — End: 2014-10-15

## 2014-09-30 MED ORDER — MOMETASONE FUROATE 50 MCG/ACT NA SUSP: NASAL | Status: DC

## 2014-09-30 MED ORDER — CEFDINIR 300 MG PO CAPS: 300.0000 mg | ORAL_CAPSULE | Freq: Two times a day (BID) | ORAL | Status: DC

## 2014-09-30 MED ORDER — FLUTICASONE PROPIONATE 50 MCG/ACT NA SUSP: 2.0000 | Freq: Every day | NASAL | Status: DC

## 2014-09-30 MED ORDER — ALBUTEROL SULFATE HFA 108 (90 BASE) MCG/ACT IN AERS: 2.0000 | INHALATION_SPRAY | Freq: Four times a day (QID) | RESPIRATORY_TRACT | Status: DC | PRN

## 2014-09-30 NOTE — Assessment & Plan Note (Signed)
Some allergies that preceded likely sinus infection.  Rx nasonex. Get otc claritin, allegra or zyrtec as well.

## 2014-09-30 NOTE — Telephone Encounter (Signed)
Caller name: [facility]  Pharmacy: [id number] - [place], [address] AT [address]  Reason for call:  As per pharmacy does not cover mometasone (NASONEX) 50 MCG/ACT nasal spray. Pharmacy is requesting an alternate

## 2014-09-30 NOTE — Assessment & Plan Note (Signed)
Your appear to have a sinus infection following allergies.Possible early bronchitis as well. I am prescribing  Cefdinir antibiotic for the infection. To help with the nasal congestion I prescribed nasal steroid. For your associated cough, I prescribed cough medicine hydromet.  I am prescribing hydromet to help with cough and this can help with your knee pain since has hydrocodone in med. But very important while on cough syrup to stop percocet. Extreme caution advised.  Rest, hydrate, tylenol for fever.  Follow up in 7 days or as needed.

## 2014-09-30 NOTE — Assessment & Plan Note (Signed)
Some transient reported recently. Rx proventil to use if needed.

## 2014-09-30 NOTE — Progress Notes (Signed)
Subjective:    Patient ID: Julie Ross, female    DOB: [birth year], 54 y.o.   MRN: [id number]  HPI  Pt in feeling sick for at least 5 days. She states started with tickle in throat/cough. Over next 5 days cough is worse dry. Some chest congestion feeling like needs to bring up mucous but can't. Mild hoarse voice since last night. Pt tried benzonatate but did not help. Some occasional mild wheeze. No history of wheezing or using inhalers. Pt not smoker and no past smoker.   Some mild maxillary pressure.  Some sneezing for couple of weeks. No runny nose.  Hx of allergies in [place].      Review of Systems  Constitutional: Negative for fever, chills and fatigue.  HENT: Positive for congestion, postnasal drip, sinus pressure and sneezing. Negative for ear pain and rhinorrhea.        Mild sorethroat with hoarse voice today  Respiratory: Positive for wheezing. Negative for apnea, chest tightness and shortness of breath.        Rare and transient.  Cardiovascular: Negative for chest pain and palpitations.  Neurological: Negative for dizziness and headaches.  Hematological: Negative for adenopathy. Does not bruise/bleed easily.  Psychiatric/Behavioral: Negative for behavioral problems and confusion.   Past Medical History  Diagnosis Date  . Hypertension   . Hypothyroidism   . Neuromuscular disorder     MS TX WITH CYMBALTA   . Multiple sclerosis   . H/O colonoscopy   . H/O bone density study 03/2011  . Wears glasses   . Heart burn   . Depression   . Hx of radiation therapy 04/17/12- 06/04/12    left chest wall, axilla, L supraclavicular fossa 4500 cGy, left mastectomy scar/chest wall 5940 cGy  . H/O echocardiogram     last one 02/2013, due to chemotherapy  . Sleep apnea     MILD SLEEP APNEA, NO (Needed) MACHINE  6 YRS AGO [facility]   . GERD (gastroesophageal reflux disease)   . Breast cancer 11/2011    lul/ER+PR+, x1 lymph node   . Cancer     squamous cell skin cancer x7    . Arthritis     knees   . Anemia     h/o - ? bld. transfusion - more recent- ?2013  . Anxiety     uses xanax mainly for sleep    History   Social History  . Marital Status: Married    Spouse Name: N/A  . Number of Children: 2  . Years of Education: N/A   Occupational History  .     Social History Main Topics  . Smoking status: Never Smoker   . Smokeless tobacco: Never Used  . Alcohol Use: No  . Drug Use: No  . Sexual Activity: Yes   Other Topics Concern  . Not on file   Social History Narrative    Past Surgical History  Procedure Laterality Date  . Tonsillectomy    . Tumor removal      FROM PELVIS AGE 87  . Cesarean section      X2   . No past surgeries      BLADDER SLING  4-5 YRS AGO   . Knee arthroscopy      LT KNEE 04/2011  . Total knee arthroplasty  08/15/2011    Procedure: TOTAL KNEE ARTHROPLASTY; lft Surgeon: Alta Corning, MD;  Location: [facility];  Service: Orthopedics;  Laterality: Left;  RIGHT KNEE CORTIZONE INJECTION  .  Mohs surgery      right face  . Knee arthroscopy  82    rt  . Mastectomy w/ sentinel node biopsy  12/19/2011    Procedure: MASTECTOMY WITH SENTINEL LYMPH NODE BIOPSY;  Surgeon: Haywood Lasso, MD;  Location: [place];  Service: General;  Laterality: Left;  left breast and left axilla   . Portacath placement  01/16/2012    Procedure: INSERTION PORT-A-CATH;  Surgeon: Haywood Lasso, MD;  Location: [place];  Service: General;  Laterality: Right;  [place] Cath Placement   . I&d knee with poly exchange  03/02/2012    Procedure: IRRIGATION AND DEBRIDEMENT KNEE WITH POLY EXCHANGE;  Surgeon: Alta Corning, MD;  Location: [place];  Service: Orthopedics;  Laterality: Left;  I&D of total knee with Possible Poly Exchange   . Tee without cardioversion  03/06/2012    Procedure: TRANSESOPHAGEAL ECHOCARDIOGRAM (TEE);  Surgeon: Josue Hector, MD;  Location: [place];  Service: Cardiovascular;  Laterality: N/A;  Rm. [id number]  .  Port-a-cath removal  03/06/2012    Procedure: REMOVAL PORT-A-CATH;  Surgeon: Odis Hollingshead, MD;  Location: [facility];  Service: General;  Laterality: N/A;  . Portacath placement  05/01/2012    Procedure: INSERTION PORT-A-CATH;  Surgeon: Haywood Lasso, MD;  Location: [place];  Service: General;  Laterality: Right;  right internal jugular port-a-cath insertion  . Total knee arthroplasty Left 08/18/2012    Procedure:  Irrigation and debridement of LEFT total knee;  Removal of total knee parts; Implant of spacers;  Surgeon: Kerin Salen, MD;  Location: [place] [place];  Service: Orthopedics;  Laterality: Left;  . Port-a-cath removal Right 08/22/2012    Procedure: REMOVAL PORT-A-CATH;  Surgeon: Clydia Nieves Jolly, MD;  Location: [place];  Service: General;  Laterality: Right;  . Joint replacement  Feb 2013    left knee- multiple surgeries   . Mastectomy    . Total knee revision Left 04/19/2013    Procedure: TOTAL KNEE REVISION AND REMOVAL CEMENT SPACER;  Surgeon: Kerin Salen, MD;  Location: [facility];  Service: Orthopedics;  Laterality: Left;  . Total knee arthroplasty Right 08/12/2013  . Total knee arthroplasty Right 08/12/2013    Procedure: RIGHT TOTAL KNEE ARTHROPLASTY;  Surgeon: Kerin Salen, MD;  Location: [place];  Service: Orthopedics;  Laterality: Right;    Family History  Problem Relation Age of Onset  . Breast cancer Paternal Aunt     dx in her 91s  . Heart disease Maternal Grandfather   . Lung cancer Maternal Uncle     smoker  . Breast cancer Paternal Grandmother     dx in her 86s  . Ovarian cancer Paternal Aunt     dx in her 66s  . Arthritis Mother   . Hypertension Mother     No Known Allergies  Current Outpatient Prescriptions on File Prior to Visit  Medication Sig Dispense Refill  . ALPRAZolam (XANAX) 0.5 MG tablet Take 1 tablet (0.5 mg total) by mouth at bedtime. 30 tablet 3  . amLODipine (NORVASC) 10 MG tablet TAKE 1 TABLET BY MOUTH EVERY DAY 30 tablet 2  .  atenolol (TENORMIN) 25 MG tablet Take 0.5 tablets (12.5 mg total) by mouth daily before breakfast. For blood pressure 30 tablet 6  . baclofen (LIORESAL) 10 MG tablet Take 10 mg by mouth 3 (three) times daily.     Marland Kitchen buPROPion (WELLBUTRIN XL) 150 MG 24 hr tablet TAKE 1 TABLET BY  MOUTH EVERY DAY 30 tablet 0  . CALCIUM-MAGNESIUM PO Take 1 tablet by mouth daily.    Marland Kitchen docusate sodium (COLACE) 50 MG capsule Take 100 mg by mouth at bedtime.    . DULoxetine (CYMBALTA) 60 MG capsule Take 1 capsule (60 mg total) by mouth daily. 90 capsule 1  . EPINEPHrine 0.3 mg/0.3 mL IJ SOAJ injection Inject 0.3 mLs (0.3 mg total) into the muscle once. 2 Device 2  . gabapentin (NEURONTIN) 300 MG capsule Take 300 mg by mouth 2 (two) times daily.     Marland Kitchen letrozole (FEMARA) 2.5 MG tablet TAKE 1 TABLET BY MOUTH EVERY DAY 30 tablet 6  . losartan-hydrochlorothiazide (HYZAAR) 100-25 MG per tablet TAKE 1 TABLET BY MOUTH EVERY MORNING BEFORE BREAKFAST 30 tablet 0  . Multiple Vitamins-Minerals (MULTIVITAMINS THER. W/MINERALS) TABS Take 1 tablet by mouth daily.    Marland Kitchen omeprazole (PRILOSEC) 40 MG capsule Take 1 capsule (40 mg total) by mouth daily. 30 capsule 6  . oxyCODONE-acetaminophen (PERCOCET) 10-325 MG per tablet Take 1 tablet by mouth every 4 (four) hours as needed for pain. 60 tablet 0  . Probiotic Product (PROBIOTIC DAILY PO) Take 1 capsule by mouth daily.     . vitamin E 400 UNIT capsule Take 800 Units by mouth daily.     Current Facility-Administered Medications on File Prior to Visit  Medication Dose Route Frequency Provider Last Rate Last Dose  . ceFAZolin (ANCEF) IVPB 2 g/50 mL premix    PRN Tawni Millers, CRNA   2 g at 04/19/13 0956  . dexamethasone (DECADRON) injection    PRN Tawni Millers, CRNA   8 mg at 05/01/12 0908  . fentaNYL (SUBLIMAZE) injection    PRN Tawni Millers, CRNA   100 mcg at 05/01/12 0856  . lidocaine (cardiac) 100 mg/76ml (XYLOCAINE) 20 MG/ML injection 2%    PRN Tawni Millers, CRNA   60 mg at  05/01/12 0856  . midazolam (VERSED) 5 MG/5ML injection    PRN Tawni Millers, CRNA   2 mg at 05/01/12 0856  . propofol (DIPRIVAN) 10 mg/mL bolus/IV push    PRN Tawni Millers, CRNA   160 mg at 05/01/12 0900    BP 122/74 mmHg  Pulse 78  Temp(Src) 98.5 F (36.9 C) (Oral)  Ht 5\' 6"  (1.676 m)  Wt 212 lb 6.4 oz (96.344 kg)  BMI 34.30 kg/m2  SpO2 96%  LMP 12/05/2011      Objective:   Physical Exam   General  Mental Status - Alert. General Appearance - Well groomed. Not in acute distress.  Skin Rashes- No Rashes.  HEENT Head- Normal. Ear Auditory Canal - Left- Normal. Right - Normal.Tympanic Membrane- Left- Normal. Right- mild red Eye Sclera/Conjunctiva- Left- Normal. Right- Normal. Nose & Sinuses Nasal Mucosa- Left-  Boggy and Congested. Right-  Boggy and  Congested.Faint maxillary and faint frontal sinus pressure. Mouth & Throat Lips: Upper Lip- Normal: no dryness, cracking, pallor, cyanosis, or vesicular eruption. Lower Lip-Normal: no dryness, cracking, pallor, cyanosis or vesicular eruption. Buccal Mucosa- Bilateral- No Aphthous ulcers. Oropharynx- No Discharge or Erythema. +pnd Tonsils: Characteristics- Bilateral- No Erythema or Congestion. Size/Enlargement- Bilateral- No enlargement. Discharge- bilateral-None.  Neck Neck- Supple. No Masses.   Chest and Lung Exam Auscultation: Breath Sounds:-Clear even and unlabored.  Cardiovascular Auscultation:Rythm- Regular, rate and rhythm. Murmurs & Other Heart Sounds:Ausculatation of the heart reveal- No Murmurs.  Lymphatic Head & Neck General Head & Neck Lymphatics: Bilateral: Description- No Localized lymphadenopathy.      Assessment & Plan:

## 2014-09-30 NOTE — Patient Instructions (Signed)
Allergic rhinitis Some allergies that preceded likely sinus infection.  Rx nasonex. Get otc claritin, allegra or zyrtec as well.   Sinusitis, acute maxillary Your appear to have a sinus infection following allergies.Possible early bronchitis as well. I am prescribing  Cefdinir antibiotic for the infection. To help with the nasal congestion I prescribed nasal steroid. For your associated cough, I prescribed cough medicine hydromet.  I am prescribing hydromet to help with cough and this can help with your knee pain since has hydrocodone in med. But very important while on cough syrup to stop percocet. Extreme caution advised.  Rest, hydrate, tylenol for fever.  Follow up in 7 days or as needed.   Wheezing Some transient reported recently. Rx proventil to use if needed.

## 2014-09-30 NOTE — Progress Notes (Signed)
Pre visit review using our clinic review tool, if applicable. No additional management support is needed unless otherwise documented below in the visit note. 

## 2014-10-01 NOTE — Telephone Encounter (Signed)
Patient notified Rx for Flonase sent to pharmacy.

## 2014-10-03 ENCOUNTER — Other Ambulatory Visit: Payer: Self-pay | Admitting: Family Medicine

## 2014-10-05 ENCOUNTER — Other Ambulatory Visit: Payer: Self-pay | Admitting: Medical

## 2014-10-05 ENCOUNTER — Encounter: Payer: Self-pay | Admitting: Neurology

## 2014-10-06 ENCOUNTER — Ambulatory Visit: Payer: 59 | Admitting: Neurology

## 2014-10-06 ENCOUNTER — Other Ambulatory Visit: Payer: Self-pay | Admitting: Internal Medicine

## 2014-10-06 ENCOUNTER — Telehealth: Payer: Self-pay | Admitting: *Deleted

## 2014-10-06 ENCOUNTER — Telehealth: Payer: Self-pay

## 2014-10-06 MED ORDER — BACLOFEN 10 MG PO TABS: 10.0000 mg | ORAL_TABLET | Freq: Three times a day (TID) | ORAL | Status: DC

## 2014-10-06 NOTE — Telephone Encounter (Signed)
I spoke with Julie Ross to check on her, as she cancelled her appt. with RAS today.  She sts. she has a sinus infection, is not feeling well enough to come to appt. today.  She is hoarse and difficult to  understand.  Sts. pcp is treating with anitbiotic.  Sts. she needs r/f of Baclofen, and per RAS ok I have refilled this. I r/s her appt. for 10-14-13 at 1320/fim

## 2014-10-06 NOTE — Telephone Encounter (Signed)
Last OV 08-12-14 (reflux) wellbutrin last filled 09-08-14 #30 with 0

## 2014-10-06 NOTE — Telephone Encounter (Signed)
I spoke with family and they state that they have tried putting patient back on Aricept 5mg . Since then patient has become more paranoid, having hallucinations, getting out of bed and opening doors and windows. They have stopped giving this medication to her on Saturday. Reports that she has "calmed down some". The family just wanted you to know this information.

## 2014-10-07 ENCOUNTER — Ambulatory Visit (INDEPENDENT_AMBULATORY_CARE_PROVIDER_SITE_OTHER): Payer: 59 | Admitting: Family Medicine

## 2014-10-07 ENCOUNTER — Encounter: Payer: Self-pay | Admitting: Family Medicine

## 2014-10-07 VITALS — BP 130/78 | HR 73 | Temp 97.9°F | Resp 16 | Wt 210.5 lb

## 2014-10-07 DIAGNOSIS — J069 Acute upper respiratory infection, unspecified: Secondary | ICD-10-CM

## 2014-10-07 DIAGNOSIS — G35 Multiple sclerosis: Secondary | ICD-10-CM | POA: Diagnosis not present

## 2014-10-07 DIAGNOSIS — F32A Depression, unspecified: Secondary | ICD-10-CM

## 2014-10-07 DIAGNOSIS — F329 Major depressive disorder, single episode, unspecified: Secondary | ICD-10-CM

## 2014-10-07 MED ORDER — BENZONATATE 200 MG PO CAPS
200.0000 mg | ORAL_CAPSULE | Freq: Three times a day (TID) | ORAL | Status: DC | PRN
Start: 2014-10-07 — End: 2014-11-21

## 2014-10-07 MED ORDER — VENLAFAXINE HCL ER 150 MG PO CP24: 150.0000 mg | ORAL_CAPSULE | Freq: Every day | ORAL | Status: DC

## 2014-10-07 MED ORDER — IPRATROPIUM-ALBUTEROL 0.5-2.5 (3) MG/3ML IN SOLN
3.0000 mL | Freq: Once | RESPIRATORY_TRACT | Status: AC
  Administered 2014-10-07: 3 mL via RESPIRATORY_TRACT

## 2014-10-07 MED ORDER — PREDNISONE 10 MG PO TABS: ORAL_TABLET | ORAL | Status: DC

## 2014-10-07 MED ORDER — AMLODIPINE BESYLATE 10 MG PO TABS: 10.0000 mg | ORAL_TABLET | Freq: Every day | ORAL | Status: DC

## 2014-10-07 NOTE — Patient Instructions (Signed)
Follow up as needed Use the albuterol inhaler- 2 puffs every 4 hrs as needed for chest tightness, cough, wheezing Start the Prednisone as directed Use the Tessalon as needed for cough Claritin or Zyrtec daily for the seasonal allergy component Drink plenty of fluids REST! STOP the Cymbalta- START the Effexor daily Call with any questions or concerns Hang in there!

## 2014-10-07 NOTE — Assessment & Plan Note (Signed)
New.  Pt's sxs and PE consistent w/ untreated allergic rhinitis and airway inflammation.  No evidence of bacterial infxn- no need for additional abx.  Pt's cough and air movement improved s/p neb tx.  Encouraged pt to use albuterol as needed.  Start low dose pred taper to improve airway inflammation.  Cough meds prn.  Reviewed supportive care and red flags that should prompt return.

## 2014-10-07 NOTE — Progress Notes (Signed)
Pre visit review using our clinic review tool, if applicable. No additional management support is needed unless otherwise documented below in the visit note. 

## 2014-10-07 NOTE — Progress Notes (Signed)
   Subjective:    Patient ID: Julie Ross, female    DOB: July 30, 1960, 54 y.o.   MRN: [id number]  HPI URI- pt was seen on 4/12 by Percell Miller.  Has 2 days remaining on Omnicef but not feeling any better than when she started.  Pt reports cough is most bothersome symptom.  Cough is not productive but 'i feel like I'm going to pass out'.  Taking Mucinex w/o relief.  No sinus pain/pressure.  No N/V.  No ear pain.  No fevers.  + laryngitis.   No known sick contacts.  Depression- pt reports the Cymbalta is too expensive.  Asking for medication change.  Review of Systems For ROS see HPI     Objective:   Physical Exam  Constitutional: She appears well-developed and well-nourished. No distress.  HENT:  Head: Normocephalic and atraumatic.  Right Ear: Tympanic membrane normal.  Left Ear: Tympanic membrane normal.  Nose: Mucosal edema and rhinorrhea present. Right sinus exhibits no maxillary sinus tenderness and no frontal sinus tenderness. Left sinus exhibits no maxillary sinus tenderness and no frontal sinus tenderness.  Mouth/Throat: Mucous membranes are normal. Posterior oropharyngeal erythema (w/ PND) present.  Laryngitis  Eyes: Conjunctivae and EOM are normal. Pupils are equal, round, and reactive to light.  Neck: Normal range of motion. Neck supple.  Cardiovascular: Normal rate, regular rhythm and normal heart sounds.   Pulmonary/Chest: Effort normal and breath sounds normal. No respiratory distress. She has no wheezes. She has no rales.  Hacking cough  Lymphadenopathy:    She has no cervical adenopathy.  Vitals reviewed.         Assessment & Plan:

## 2014-10-07 NOTE — Assessment & Plan Note (Signed)
Chronic problem.  Switch to Effexor due to cost.  Script provided.

## 2014-10-15 ENCOUNTER — Encounter: Payer: Self-pay | Admitting: Neurology

## 2014-10-15 ENCOUNTER — Ambulatory Visit (INDEPENDENT_AMBULATORY_CARE_PROVIDER_SITE_OTHER): Payer: 59 | Admitting: Neurology

## 2014-10-15 VITALS — BP 134/86 | HR 66 | Resp 14 | Ht 66.0 in | Wt 210.0 lb

## 2014-10-15 DIAGNOSIS — F411 Generalized anxiety disorder: Secondary | ICD-10-CM | POA: Diagnosis not present

## 2014-10-15 DIAGNOSIS — C50411 Malignant neoplasm of upper-outer quadrant of right female breast: Secondary | ICD-10-CM

## 2014-10-15 DIAGNOSIS — M170 Bilateral primary osteoarthritis of knee: Secondary | ICD-10-CM

## 2014-10-15 DIAGNOSIS — G2581 Restless legs syndrome: Secondary | ICD-10-CM | POA: Diagnosis not present

## 2014-10-15 DIAGNOSIS — Z79899 Other long term (current) drug therapy: Secondary | ICD-10-CM

## 2014-10-15 DIAGNOSIS — G4733 Obstructive sleep apnea (adult) (pediatric): Secondary | ICD-10-CM | POA: Diagnosis not present

## 2014-10-15 DIAGNOSIS — R5383 Other fatigue: Secondary | ICD-10-CM | POA: Insufficient documentation

## 2014-10-15 DIAGNOSIS — G35 Multiple sclerosis: Secondary | ICD-10-CM

## 2014-10-15 MED ORDER — BACLOFEN 10 MG PO TABS: 10.0000 mg | ORAL_TABLET | Freq: Three times a day (TID) | ORAL | Status: DC

## 2014-10-15 MED ORDER — ALPRAZOLAM 0.5 MG PO TABS: 0.5000 mg | ORAL_TABLET | Freq: Two times a day (BID) | ORAL | Status: DC | PRN

## 2014-10-15 NOTE — Progress Notes (Signed)
[facility]  PATIENT: Julie Ross DOB: 12-13-1960  REFERRING DOCTOR OR PCP:  Annye Asa SOURCE: patient and records from [facility]  _________________________________   HISTORICAL  CHIEF COMPLAINT:  Chief Complaint  Patient presents with  . Multiple Sclerosis    She is not currently taking any MS med--due to risk of immunosuuppression.  She has had 4 left knee surgeries--with 2 infections--ortho had recommended she not take anything that may interfere with immune response.  She is currently recovering from an uri--has had mult. antibiotics.  Voice is still horase.  No recent fever./fim    HISTORY OF PRESENT ILLNESS:  Julie Ross is a 54 year old woman with multiple sclerosis who I have followed for many years at [facility].   She was diagnosed with MS in 1999 after presenting with optic neuritis.   She had a spinal tap, MRI and angiogram.   Initially, the diagnosis was uncertain but she had changes in the MRI over time leading to a diagnosis of MS a few years later.    Initially, she was placed on Copaxone, then switched to Avonex due to skin reactions.   She then switched to Tysabri when she had some mild breakthrough disease. She tolerated Tysabri very well and her MS did well.  However, she was JCV antibody positive so she stopped in 2012.   She did not restart Avonex as she did not want to go back to an injection.    Her last exacerbation was optic neuritis in 2012 treated with steroids. She proved but did not get back to baseline.  This exacerbation occurred off any DMT.  She tried Gilenya in 2013. She had trouble tolerating Gilenya and discontinued shortly after starting it. She was going to start Tecfidera but was diagnosed with breast cancer in 2013 and opted not to start at that time. She received chemotherapy including Taxotere, carboplatin, Herceptin in 2013 and 2014. An MRI of the brain in 2015 showed only mild MS progression  compared to her previous one in 2011.  Gait/strength/sensation:   She fees that her legs fatigue easily but not actually weak.   Gait is fine but her endurance is low some days.  She occasioanally trips over her feet but has not fallen.  She denies any leg numbness.  Vision:   She had left optic neuritis in 2012. Visual acuity improved but not to baseline.   Colors are mildly desatyurated  Bladder/bowel:   Bladder function is fine.   Bowel function is fine.     Fatigue/sleep:   She notes a lot of fatigue, both physical and mental.    She does worse when hot or when she had any infection.    She also has severe obstructive sleep apnea (AHI=33) that was diagnosed on 11/20/2013.   This had progressed from mild OSA diagnosed in 2009.   She was placed on Auto-PAP but has had difficulty tolerating it.  She has used the PAP very intermittently.    She has woken up gasping some nights.    She also has moderate RLS with some impact on sleep.   She has excessive daytime sleepiness and fatigue during the day but has insomnia at night.     In September, 2015, I prescribed NuVigil and ropinirole  Nuvigil helped a bit but made her jittery.   She never took the ropinirole  Mood/cognition:   She reports some anxiety but no significant depression.    She notes mild cognitive issues with  verbal fluency, memory and attention deficit.      LBP:   She has had pain off and on few years. Currently pain is not that bad though it is still bothersome at times. MRI 11/08/2013 showed left L3-L4 and L5S1 disc protrusions and L4L5 and L5S1 facet hypertrophy but no definite nerve root compression.  She has had multiple infections in the left knee during chemotherapy.   She has had bilateral TKR.   Due to infections and hardware infection, she has had 4 operations on the left.     REVIEW OF SYSTEMS: Constitutional: No fevers, chills, sweats, or change in appetite.  Notes fatigue Eyes: No visual changes, double vision, eye  pain Ear, nose and throat: No hearing loss, ear pain, nasal congestion, sore throat.  Recent URI Cardiovascular: No chest pain, palpitations Respiratory: No shortness of breath at rest or with exertion.   No wheezes.  Has OSA GastrointestinaI: No nausea, vomiting, diarrhea, abdominal pain, fecal incontinence Genitourinary: No dysuria, urinary retention or frequency.  No nocturia. Musculoskeletal: No neck pain, back pain.  bilat knee pain Integumentary: No rash, pruritus, skin lesions Neurological: as above Psychiatric: No depression at this time.  Some anxiety Endocrine: No palpitations, diaphoresis, change in appetite, change in weigh or increased thirst Hematologic/Lymphatic: No anemia, purpura, petechiae. Allergic/Immunologic: No itchy/runny eyes, nasal congestion, recent allergic reactions, rashes  ALLERGIES: No Known Allergies  HOME MEDICATIONS:  Current outpatient prescriptions:  .  ALPRAZolam (XANAX) 0.5 MG tablet, Take 1 tablet (0.5 mg total) by mouth at bedtime., Disp: 30 tablet, Rfl: 3 .  amLODipine (NORVASC) 10 MG tablet, Take 1 tablet (10 mg total) by mouth daily., Disp: 30 tablet, Rfl: 2 .  atenolol (TENORMIN) 25 MG tablet, Take 0.5 tablets (12.5 mg total) by mouth daily before breakfast. For blood pressure, Disp: 30 tablet, Rfl: 6 .  baclofen (LIORESAL) 10 MG tablet, Take 1 tablet (10 mg total) by mouth 3 (three) times daily., Disp: 90 each, Rfl: 1 .  benzonatate (TESSALON) 200 MG capsule, Take 1 capsule (200 mg total) by mouth 3 (three) times daily as needed for cough., Disp: 60 capsule, Rfl: 0 .  buPROPion (WELLBUTRIN XL) 150 MG 24 hr tablet, TAKE 1 TABLET BY MOUTH EVERY DAY, Disp: 30 tablet, Rfl: 6 .  CALCIUM-MAGNESIUM PO, Take 1 tablet by mouth daily., Disp: , Rfl:  .  docusate sodium (COLACE) 50 MG capsule, Take 100 mg by mouth at bedtime., Disp: , Rfl:  .  EPINEPHrine 0.3 mg/0.3 mL IJ SOAJ injection, Inject 0.3 mLs (0.3 mg total) into the muscle once., Disp: 2  Device, Rfl: 2 .  gabapentin (NEURONTIN) 300 MG capsule, Take 300 mg by mouth 2 (two) times daily. , Disp: , Rfl:  .  HYDROcodone-acetaminophen (NORCO/VICODIN) 5-325 MG per tablet, Take 1 tablet by mouth daily as needed. , Disp: , Rfl:  .  letrozole (FEMARA) 2.5 MG tablet, TAKE 1 TABLET BY MOUTH EVERY DAY, Disp: 30 tablet, Rfl: 6 .  losartan-hydrochlorothiazide (HYZAAR) 100-25 MG per tablet, TAKE 1 TABLET BY MOUTH EVERY MORNING BEFORE BREAKFAST, Disp: 30 tablet, Rfl: 0 .  Multiple Vitamins-Minerals (MULTIVITAMINS THER. W/MINERALS) TABS, Take 1 tablet by mouth daily., Disp: , Rfl:  .  omeprazole (PRILOSEC) 40 MG capsule, Take 1 capsule (40 mg total) by mouth daily., Disp: 30 capsule, Rfl: 6 .  Probiotic Product (PROBIOTIC DAILY PO), Take 1 capsule by mouth daily. , Disp: , Rfl:  .  venlafaxine XR (EFFEXOR-XR) 150 MG 24 hr capsule, Take 1 capsule (  150 mg total) by mouth daily with breakfast., Disp: 30 capsule, Rfl: 6 .  vitamin E 400 UNIT capsule, Take 800 Units by mouth daily., Disp: , Rfl:  .  albuterol (PROVENTIL HFA;VENTOLIN HFA) 108 (90 BASE) MCG/ACT inhaler, Inhale 2 puffs into the lungs every 6 (six) hours as needed for wheezing or shortness of breath., Disp: 1 Inhaler, Rfl: 2 No current facility-administered medications for this visit.  Facility-Administered Medications Ordered in Other Visits:  .  ceFAZolin (ANCEF) IVPB 2 g/50 mL premix, , , PRN, Tawni Millers, CRNA, 2 g at 04/19/13 0956 .  dexamethasone (DECADRON) injection, , , PRN, Tawni Millers, CRNA, 8 mg at 05/01/12 0908 .  fentaNYL (SUBLIMAZE) injection, , , PRN, Tawni Millers, CRNA, 100 mcg at 05/01/12 0856 .  lidocaine (cardiac) 100 mg/22ml (XYLOCAINE) 20 MG/ML injection 2%, , , PRN, Tawni Millers, CRNA, 60 mg at 05/01/12 0856 .  midazolam (VERSED) 5 MG/5ML injection, , , PRN, Tawni Millers, CRNA, 2 mg at 05/01/12 0856 .  propofol (DIPRIVAN) 10 mg/mL bolus/IV push, , , PRN, Tawni Millers, CRNA, 160 mg at 05/01/12 0900  PAST  MEDICAL HISTORY: Past Medical History  Diagnosis Date  . Hypertension   . Hypothyroidism   . Neuromuscular disorder     MS TX WITH CYMBALTA   . Multiple sclerosis   . H/O colonoscopy   . H/O bone density study 03/2011  . Wears glasses   . Heart burn   . Depression   . Hx of radiation therapy 04/17/12- 06/04/12    left chest wall, axilla, L supraclavicular fossa 4500 cGy, left mastectomy scar/chest wall 5940 cGy  . H/O echocardiogram     last one 02/2013, due to chemotherapy  . Sleep apnea     MILD SLEEP APNEA, NO (Needed) MACHINE  6 YRS AGO HPT [facility]   . GERD (gastroesophageal reflux disease)   . Breast cancer 11/2011    lul/ER+PR+, x1 lymph node   . Cancer     squamous cell skin cancer x7  . Arthritis     knees   . Anemia     h/o - ? bld. transfusion - more recent- ?2013  . Anxiety     uses xanax mainly for sleep    PAST SURGICAL HISTORY: Past Surgical History  Procedure Laterality Date  . Tonsillectomy    . Tumor removal      FROM PELVIS AGE 4  . Cesarean section      X2   . No past surgeries      BLADDER SLING  4-5 YRS AGO   . Knee arthroscopy      LT KNEE 04/2011  . Total knee arthroplasty  08/15/2011    Procedure: TOTAL KNEE ARTHROPLASTY; lft Surgeon: Alta Corning, MD;  Location: [place];  Service: Orthopedics;  Laterality: Left;  RIGHT KNEE CORTIZONE INJECTION  . Mohs surgery      right face  . Knee arthroscopy  82    rt  . Mastectomy w/ sentinel node biopsy  12/19/2011    Procedure: MASTECTOMY WITH SENTINEL LYMPH NODE BIOPSY;  Surgeon: Haywood Lasso, MD;  Location: [facility];  Service: General;  Laterality: Left;  left breast and left axilla   . Portacath placement  01/16/2012    Procedure: INSERTION PORT-A-CATH;  Surgeon: Haywood Lasso, MD;  Location: [facility];  Service: General;  Laterality: Right;  [place] Cath Placement   . I&d knee with poly exchange  03/02/2012    Procedure:  IRRIGATION AND DEBRIDEMENT KNEE WITH POLY EXCHANGE;   Surgeon: Alta Corning, MD;  Location: [facility];  Service: Orthopedics;  Laterality: Left;  I&D of total knee with Possible Poly Exchange   . Tee without cardioversion  03/06/2012    Procedure: TRANSESOPHAGEAL ECHOCARDIOGRAM (TEE);  Surgeon: Josue Hector, MD;  Location: [facility];  Service: Cardiovascular;  Laterality: N/A;  Rm. 2927  . Port-a-cath removal  03/06/2012    Procedure: REMOVAL PORT-A-CATH;  Surgeon: Odis Hollingshead, MD;  Location: [facility];  Service: General;  Laterality: N/A;  . Portacath placement  05/01/2012    Procedure: INSERTION PORT-A-CATH;  Surgeon: Haywood Lasso, MD;  Location: [facility];  Service: General;  Laterality: Right;  right internal jugular port-a-cath insertion  . Total knee arthroplasty Left 08/18/2012    Procedure:  Irrigation and debridement of LEFT total knee;  Removal of total knee parts; Implant of spacers;  Surgeon: Kerin Salen, MD;  Location: [facility];  Service: Orthopedics;  Laterality: Left;  . Port-a-cath removal Right 08/22/2012    Procedure: REMOVAL PORT-A-CATH;  Surgeon: Edward Jolly, MD;  Location: [facility];  Service: General;  Laterality: Right;  . Joint replacement  Feb 2013    left knee- multiple surgeries   . Mastectomy    . Total knee revision Left 04/19/2013    Procedure: TOTAL KNEE REVISION AND REMOVAL CEMENT SPACER;  Surgeon: Kerin Salen, MD;  Location: [facility];  Service: Orthopedics;  Laterality: Left;  . Total knee arthroplasty Right 08/12/2013  . Total knee arthroplasty Right 08/12/2013    Procedure: RIGHT TOTAL KNEE ARTHROPLASTY;  Surgeon: Kerin Salen, MD;  Location: [place] [facility];  Service: Orthopedics;  Laterality: Right;    FAMILY HISTORY: Family History  Problem Relation Age of Onset  . Breast cancer Paternal Aunt     dx in her 79s  . Heart disease Maternal Grandfather   . Lung cancer Maternal Uncle     smoker  . Breast cancer Paternal Grandmother     dx in her 11s  . Ovarian cancer Paternal Aunt     dx in her  71s  . Arthritis Mother   . Hypertension Mother   . Dementia Father   . Diabetes type II Father     SOCIAL HISTORY:  History   Social History  . Marital Status: Married    Spouse Name: N/A  . Number of Children: 2  . Years of Education: N/A   Occupational History  .     Social History Main Topics  . Smoking status: Never Smoker   . Smokeless tobacco: Never Used  . Alcohol Use: No  . Drug Use: No  . Sexual Activity: Yes   Other Topics Concern  . Not on file   Social History Narrative     PHYSICAL EXAM  Filed Vitals:   10/15/14 1325  BP: 134/86  Pulse: 66  Resp: 14  Height: 5\' 6"  (1.676 m)  Weight: 210 lb (95.255 kg)    Body mass index is 33.91 kg/(m^2).   General: The patient is well-developed and well-nourished and in no acute distress  Eyes:  Funduscopic exam shows normal optic discs and retinal vessels.  Neck: The neck is supple, no carotid bruits are noted.  The neck is nontender.  Cardiovascular: The heart has a regular rate and rhythm with a normal S1 and S2. There were no murmurs, gallops or rubs. Lungs are clear to auscultation.  Skin: Extremities are without  significant edema.  Musculoskeletal:  Back is nontender.   Bilateral TKR scars.   Left knee mildly tender  Neurologic Exam  Mental status: The patient is alert and oriented x 3 at the time of the examination. The patient has apparent normal recent and remote memory, with an apparently normal attention span and concentration ability.   Speech is normal.  Cranial nerves: Extraocular movements are full. Pupils are equal, round, and reactive to light and accomodation.  Visual fields are full.  Facial symmetry is present. There is good facial sensation to soft touch bilaterally.Facial strength is normal.  Trapezius and sternocleidomastoid strength is normal. No dysarthria is noted.  The tongue is midline, and the patient has symmetric elevation of the soft palate. No obvious hearing deficits are  noted.  Motor:  Muscle bulk is normal.   Tone is normal. Strength is  5 / 5 in all 4 extremities.   Sensory: Sensory testing is intact to pinprick, soft touch and vibration sensation in all 4 extremities.  Coordination: Cerebellar testing reveals good finger-nose-finger and heel-to-shin bilaterally.  Gait and station: Station is normal.   Gait is artthritic. Tandem gait is wide. Romberg is negative.   Reflexes: Deep tendon reflexes are symmetric and normal bilaterally (skipped tender knees).   Plantar responses are flexor.    DIAGNOSTIC DATA (LABS, IMAGING, TESTING) - I reviewed patient records, labs, notes, testing and imaging myself where available.  Lab Results  Component Value Date   WBC 7.3 08/18/2014   HGB 12.5 08/18/2014   HCT 38.2 08/18/2014   MCV 84.2 08/18/2014   PLT 267 08/18/2014      Component Value Date/Time   NA 138 08/18/2014 1551   NA 138 10/14/2013 0913   K 3.7 08/18/2014 1551   K 3.4* 10/14/2013 0913   CL 99 10/14/2013 0913   CL 100 11/30/2012 1029   CO2 27 08/18/2014 1551   CO2 29 10/14/2013 0913   GLUCOSE 117 08/18/2014 1551   GLUCOSE 102* 10/14/2013 0913   GLUCOSE 91 11/30/2012 1029   BUN 17.2 08/18/2014 1551   BUN 16 10/14/2013 0913   CREATININE 1.3* 08/18/2014 1551   CREATININE 1.2 10/14/2013 0913   CALCIUM 9.1 08/18/2014 1551   CALCIUM 9.5 10/14/2013 0913   PROT 7.1 08/18/2014 1551   PROT 7.1 10/14/2013 0913   ALBUMIN 4.0 08/18/2014 1551   ALBUMIN 4.0 10/14/2013 0913   AST 19 08/18/2014 1551   AST 22 10/14/2013 0913   ALT 21 08/18/2014 1551   ALT 19 10/14/2013 0913   ALKPHOS 137 08/18/2014 1551   ALKPHOS 145* 10/14/2013 0913   BILITOT 0.46 08/18/2014 1551   BILITOT 0.4 10/14/2013 0913   GFRNONAA 52* 08/05/2013 1213   GFRAA 61* 08/05/2013 1213       ASSESSMENT AND PLAN  Multiple sclerosis - Plan: Hepatic function panel, CBC with Differential, Quantiferon tb gold assay  High risk medication use - Plan: Hepatic function panel,  CBC with Differential, Quantiferon tb gold assay  Anxiety state  Other fatigue  Obstructive sleep apnea  Restless leg syndrome  Primary osteoarthritis of both knees  Breast cancer of upper-outer quadrant of right female breast   In summary, Julie Ross is a 54 year old woman with multiple sclerosis who is currently off of therapy as she was on chemotherapy for breast cancer. We discussed her various options. She is JCV antibody positive and prefers not to go back on Tysabri. Because of her frequent infections, I would not want to use Tecfidera or  Gilenya as they may be more immunosuppressive. Julie Ross is more of an immunomodulator.   It has not been associated with PML in the past. She signed the service request form and we will get her started. I will check a CBC, LFT and Quantiferon Gold test.     We discussed the importance of her using CPAP on a more regular basis. She has severe OSA and sleep apnea is likely contributing to her daytime fatigue and sleepiness. As she is on multiple medications, she did not want to be treated for restless leg syndrome. She has anxiety that is helped by Xanax and I will renew this for her.  She will return to see me in 5-6 months or sooner if she has new or worsening neurologic symptoms.  Julie Ross A. Felecia Shelling, MD, PhD 1/61/0960, 4:54 PM Certified in Neurology, Clinical Neurophysiology, Sleep Medicine, Pain Medicine and Neuroimaging  [facility] [address], [place], [place] [address] [phone]

## 2014-10-16 LAB — CBC WITH DIFFERENTIAL/PLATELET
Basophils Absolute: 0.1 10*3/uL (ref 0.0–0.2)
Basos: 0 %
EOS (ABSOLUTE): 0.2 10*3/uL (ref 0.0–0.4)
Eos: 2 %
Hematocrit: 39.1 % (ref 34.0–46.6)
Hemoglobin: 13.2 g/dL (ref 11.1–15.9)
Immature Grans (Abs): 0 10*3/uL (ref 0.0–0.1)
Immature Granulocytes: 0 %
Lymphocytes Absolute: 2.7 10*3/uL (ref 0.7–3.1)
Lymphs: 24 %
MCH: 28.3 pg (ref 26.6–33.0)
MCHC: 33.8 g/dL (ref 31.5–35.7)
MCV: 84 fL (ref 79–97)
Monocytes Absolute: 0.7 10*3/uL (ref 0.1–0.9)
Monocytes: 7 %
Neutrophils Absolute: 7.6 10*3/uL — ABNORMAL HIGH (ref 1.4–7.0)
Neutrophils: 67 %
Platelets: 310 10*3/uL (ref 150–379)
RBC: 4.66 x10E6/uL (ref 3.77–5.28)
RDW: 14.4 % (ref 12.3–15.4)
WBC: 11.4 10*3/uL — ABNORMAL HIGH (ref 3.4–10.8)

## 2014-10-16 LAB — HEPATIC FUNCTION PANEL
ALT: 14 IU/L (ref 0–32)
AST: 12 IU/L (ref 0–40)
Albumin: 4.5 g/dL (ref 3.5–5.5)
Alkaline Phosphatase: 135 IU/L — ABNORMAL HIGH (ref 39–117)
Bilirubin Total: 0.6 mg/dL (ref 0.0–1.2)
Bilirubin, Direct: 0.14 mg/dL (ref 0.00–0.40)
Total Protein: 6.8 g/dL (ref 6.0–8.5)

## 2014-10-17 LAB — QUANTIFERON IN TUBE
QFT TB AG MINUS NIL VALUE: 0 IU/mL
QUANTIFERON MITOGEN VALUE: 9.29 IU/mL
QUANTIFERON TB AG VALUE: 0.03 IU/mL
QUANTIFERON TB GOLD: NEGATIVE
Quantiferon Nil Value: 0.03 IU/mL

## 2014-10-17 LAB — QUANTIFERON TB GOLD ASSAY (BLOOD)

## 2014-10-20 ENCOUNTER — Ambulatory Visit (INDEPENDENT_AMBULATORY_CARE_PROVIDER_SITE_OTHER): Payer: 59 | Admitting: Physician Assistant

## 2014-10-20 ENCOUNTER — Ambulatory Visit (HOSPITAL_BASED_OUTPATIENT_CLINIC_OR_DEPARTMENT_OTHER)
Admission: RE | Admit: 2014-10-20 | Discharge: 2014-10-20 | Disposition: A | Discharge status: Home / Self Care | Payer: 59 | Source: Ambulatory Visit | Attending: Physician Assistant | Admitting: Physician Assistant

## 2014-10-20 ENCOUNTER — Encounter: Payer: Self-pay | Admitting: Physician Assistant

## 2014-10-20 VITALS — BP 117/75 | HR 91 | Temp 98.2°F | Wt 211.0 lb

## 2014-10-20 DIAGNOSIS — R0989 Other specified symptoms and signs involving the circulatory and respiratory systems: Secondary | ICD-10-CM | POA: Insufficient documentation

## 2014-10-20 DIAGNOSIS — R05 Cough: Secondary | ICD-10-CM | POA: Insufficient documentation

## 2014-10-20 DIAGNOSIS — J00 Acute nasopharyngitis [common cold]: Secondary | ICD-10-CM

## 2014-10-20 DIAGNOSIS — J208 Acute bronchitis due to other specified organisms: Secondary | ICD-10-CM

## 2014-10-20 DIAGNOSIS — B9689 Other specified bacterial agents as the cause of diseases classified elsewhere: Secondary | ICD-10-CM

## 2014-10-20 DIAGNOSIS — R42 Dizziness and giddiness: Secondary | ICD-10-CM | POA: Diagnosis not present

## 2014-10-20 MED ORDER — MECLIZINE HCL 32 MG PO TABS: 32.0000 mg | ORAL_TABLET | Freq: Three times a day (TID) | ORAL | Status: DC | PRN

## 2014-10-20 MED ORDER — DOXYCYCLINE HYCLATE 100 MG PO CAPS: 100.0000 mg | ORAL_CAPSULE | Freq: Two times a day (BID) | ORAL | Status: DC

## 2014-10-20 MED ORDER — HYDROCOD POLST-CPM POLST ER 10-8 MG/5ML PO SUER: 5.0000 mL | Freq: Two times a day (BID) | ORAL | Status: DC | PRN

## 2014-10-20 NOTE — Patient Instructions (Signed)
Please go downstairs for x-ray. I will call you with your results. Stay well hydrated and drink a Gatorade daily. Eat a well-balanced diet. Take antibiotic daily as directed. Continue Mucinex twice daily. Use Tussionex as directed for cough.

## 2014-10-20 NOTE — Telephone Encounter (Signed)
-----   Message from Britt Bottom, MD sent at 10/20/2014  8:35 AM EDT ----- Labs are fine including TB test   We can send in [place] ----- Message -----    From: [facility] Lab Results In Interface    Sent: 10/16/2014   5:45 AM      To: Britt Bottom, MD

## 2014-10-20 NOTE — Progress Notes (Signed)
Patient presents to clinic today c/o continued dry cough x 5 weeks.  Was previously seen by other provider and diagnosed with allergic rhinitis as cause of symptoms. Patient now with constant SOB and chest pressure associated with nausea and anorexia.  Endorses dizziness with coughing and standing.  Denies fever, chills, vomiting, syncope, recent travel or sick contact.  Past Medical History  Diagnosis Date  . Hypertension   . Hypothyroidism   . Neuromuscular disorder     MS TX WITH CYMBALTA   . Multiple sclerosis   . H/O colonoscopy   . H/O bone density study 03/2011  . Wears glasses   . Heart burn   . Depression   . Hx of radiation therapy 04/17/12- 06/04/12    left chest wall, axilla, L supraclavicular fossa 4500 cGy, left mastectomy scar/chest wall 5940 cGy  . H/O echocardiogram     last one 02/2013, due to chemotherapy  . Sleep apnea     MILD SLEEP APNEA, NO (Needed) MACHINE  6 YRS AGO HPT [facility]   . GERD (gastroesophageal reflux disease)   . Breast cancer 11/2011    lul/ER+PR+, x1 lymph node   . Cancer     squamous cell skin cancer x7  . Arthritis     knees   . Anemia     h/o - ? bld. transfusion - more recent- ?2013  . Anxiety     uses xanax mainly for sleep    Current Outpatient Prescriptions on File Prior to Visit  Medication Sig Dispense Refill  . albuterol (PROVENTIL HFA;VENTOLIN HFA) 108 (90 BASE) MCG/ACT inhaler Inhale 2 puffs into the lungs every 6 (six) hours as needed for wheezing or shortness of breath. 1 Inhaler 2  . ALPRAZolam (XANAX) 0.5 MG tablet Take 1 tablet (0.5 mg total) by mouth 2 (two) times daily as needed for anxiety. 60 tablet 5  . amLODipine (NORVASC) 10 MG tablet Take 1 tablet (10 mg total) by mouth daily. 30 tablet 2  . atenolol (TENORMIN) 25 MG tablet Take 0.5 tablets (12.5 mg total) by mouth daily before breakfast. For blood pressure 30 tablet 6  . baclofen (LIORESAL) 10 MG tablet Take 1 tablet (10 mg total) by mouth 3 (three) times daily.  90 each 11  . benzonatate (TESSALON) 200 MG capsule Take 1 capsule (200 mg total) by mouth 3 (three) times daily as needed for cough. 60 capsule 0  . buPROPion (WELLBUTRIN XL) 150 MG 24 hr tablet TAKE 1 TABLET BY MOUTH EVERY DAY 30 tablet 6  . CALCIUM-MAGNESIUM PO Take 1 tablet by mouth daily.    Marland Kitchen docusate sodium (COLACE) 50 MG capsule Take 100 mg by mouth at bedtime.    Marland Kitchen EPINEPHrine 0.3 mg/0.3 mL IJ SOAJ injection Inject 0.3 mLs (0.3 mg total) into the muscle once. 2 Device 2  . gabapentin (NEURONTIN) 300 MG capsule Take 300 mg by mouth 2 (two) times daily.     Marland Kitchen HYDROcodone-acetaminophen (NORCO/VICODIN) 5-325 MG per tablet Take 1 tablet by mouth daily as needed.     Marland Kitchen letrozole (FEMARA) 2.5 MG tablet TAKE 1 TABLET BY MOUTH EVERY DAY 30 tablet 6  . losartan-hydrochlorothiazide (HYZAAR) 100-25 MG per tablet TAKE 1 TABLET BY MOUTH EVERY MORNING BEFORE BREAKFAST 30 tablet 0  . Multiple Vitamins-Minerals (MULTIVITAMINS THER. W/MINERALS) TABS Take 1 tablet by mouth daily.    Marland Kitchen omeprazole (PRILOSEC) 40 MG capsule Take 1 capsule (40 mg total) by mouth daily. 30 capsule 6  . Probiotic Product (  PROBIOTIC DAILY PO) Take 1 capsule by mouth daily.     Marland Kitchen venlafaxine XR (EFFEXOR-XR) 150 MG 24 hr capsule Take 1 capsule (150 mg total) by mouth daily with breakfast. 30 capsule 6  . vitamin E 400 UNIT capsule Take 800 Units by mouth daily.    . Teriflunomide 14 MG TABS Take 14 mg by mouth.     Current Facility-Administered Medications on File Prior to Visit  Medication Dose Route Frequency Provider Last Rate Last Dose  . ceFAZolin (ANCEF) IVPB 2 g/50 mL premix    PRN Tawni Millers, CRNA   2 g at 04/19/13 0956  . dexamethasone (DECADRON) injection    PRN Tawni Millers, CRNA   8 mg at 05/01/12 0908  . fentaNYL (SUBLIMAZE) injection    PRN Tawni Millers, CRNA   100 mcg at 05/01/12 0856  . lidocaine (cardiac) 100 mg/63m (XYLOCAINE) 20 MG/ML injection 2%    PRN WTawni Millers CRNA   60 mg at 05/01/12 0856  .  midazolam (VERSED) 5 MG/5ML injection    PRN WTawni Millers CRNA   2 mg at 05/01/12 0856  . propofol (DIPRIVAN) 10 mg/mL bolus/IV push    PRN WTawni Millers CRNA   160 mg at 05/01/12 0900    No Known Allergies  Family History  Problem Relation Age of Onset  . Breast cancer Paternal Aunt     dx in her 586s . Heart disease Maternal Grandfather   . Lung cancer Maternal Uncle     smoker  . Breast cancer Paternal Grandmother     dx in her 726s . Ovarian cancer Paternal Aunt     dx in her 318s . Arthritis Mother   . Hypertension Mother   . Dementia Father   . Diabetes type II Father     History   Social History  . Marital Status: Married    Spouse Name: N/A  . Number of Children: 2  . Years of Education: N/A   Occupational History  .     Social History Main Topics  . Smoking status: Never Smoker   . Smokeless tobacco: Never Used  . Alcohol Use: No  . Drug Use: No  . Sexual Activity: Yes   Other Topics Concern  . None   Social History Narrative   Review of Systems - See HPI.  All other ROS are negative.  BP 117/75 mmHg  Pulse 91  Temp(Src) 98.2 F (36.8 C)  Wt 211 lb (95.709 kg)  SpO2 97%  LMP 12/05/2011  Physical Exam  Constitutional: She is oriented to person, place, and time and well-developed, well-nourished, and in no distress.  HENT:  Head: Normocephalic and atraumatic.  Eyes: Conjunctivae are normal.  Neck: Neck supple.  Cardiovascular: Normal rate, regular rhythm, normal heart sounds and intact distal pulses.   Pulmonary/Chest: Effort normal. No respiratory distress. She has wheezes. She has no rales. She exhibits no tenderness.  Neurological: She is alert and oriented to person, place, and time.  Skin: Skin is warm and dry. No rash noted.  Psychiatric: Affect normal.  Vitals reviewed.   Recent Results (from the past 2160 hour(s))  CBC with Differential     Status: None   Collection Time: 08/18/14  3:51 PM  Result Value Ref Range   WBC 7.3  3.9 - 10.3 10e3/uL   NEUT# 4.7 1.5 - 6.5 10e3/uL   HGB 12.5 11.6 - 15.9 g/dL   HCT 38.2 34.8 - 46.6 %  Platelets 267 145 - 400 10e3/uL   MCV 84.2 79.5 - 101.0 fL   MCH 27.5 25.1 - 34.0 pg   MCHC 32.7 31.5 - 36.0 g/dL   RBC 4.53 3.70 - 5.45 10e6/uL   RDW 13.3 11.2 - 14.5 %   lymph# 1.9 0.9 - 3.3 10e3/uL   MONO# 0.5 0.1 - 0.9 10e3/uL   Eosinophils Absolute 0.1 0.0 - 0.5 10e3/uL   Basophils Absolute 0.1 0.0 - 0.1 10e3/uL   NEUT% 64.3 38.4 - 76.8 %   LYMPH% 26.6 14.0 - 49.7 %   MONO% 6.2 0.0 - 14.0 %   EOS% 2.0 0.0 - 7.0 %   BASO% 0.9 0.0 - 2.0 %  Comprehensive metabolic panel (Cmet) - [facility]     Status: Abnormal   Collection Time: 08/18/14  3:51 PM  Result Value Ref Range   Sodium 138 136 - 145 mEq/L   Potassium 3.7 3.5 - 5.1 mEq/L   Chloride 100 98 - 109 mEq/L   CO2 27 22 - 29 mEq/L   Glucose 117 70 - 140 mg/dl   BUN 17.2 7.0 - 26.0 mg/dL   Creatinine 1.3 (H) 0.6 - 1.1 mg/dL   Total Bilirubin 0.46 0.20 - 1.20 mg/dL   Alkaline Phosphatase 137 40 - 150 U/L   AST 19 5 - 34 U/L   ALT 21 0 - 55 U/L   Total Protein 7.1 6.4 - 8.3 g/dL   Albumin 4.0 3.5 - 5.0 g/dL   Calcium 9.1 8.4 - 10.4 mg/dL   Anion Gap 11 3 - 11 mEq/L   EGFR 44 (L) >90 ml/min/1.73 m2    Comment: eGFR is calculated using the CKD-EPI Creatinine Equation (2009)  Hepatic function panel     Status: Abnormal   Collection Time: 10/15/14  2:20 PM  Result Value Ref Range   Total Protein 6.8 6.0 - 8.5 g/dL   Albumin 4.5 3.5 - 5.5 g/dL   Bilirubin Total 0.6 0.0 - 1.2 mg/dL   Bilirubin, Direct 0.14 0.00 - 0.40 mg/dL   Alkaline Phosphatase 135 (H) 39 - 117 IU/L   AST 12 0 - 40 IU/L   ALT 14 0 - 32 IU/L  CBC with Differential     Status: Abnormal   Collection Time: 10/15/14  2:20 PM  Result Value Ref Range   WBC 11.4 (H) 3.4 - 10.8 x10E3/uL   RBC 4.66 3.77 - 5.28 x10E6/uL   Hemoglobin 13.2 11.1 - 15.9 g/dL   Hematocrit 39.1 34.0 - 46.6 %   MCV 84 79 - 97 fL   MCH 28.3 26.6 - 33.0 pg   MCHC 33.8 31.5 - 35.7 g/dL     RDW 14.4 12.3 - 15.4 %   Platelets 310 150 - 379 x10E3/uL   NEUTROPHILS 67 %   Lymphs 24 %   Monocytes 7 %   Eos 2 %   Basos 0 %   Neutrophils Absolute 7.6 (H) 1.4 - 7.0 x10E3/uL   Lymphocytes Absolute 2.7 0.7 - 3.1 x10E3/uL   Monocytes Absolute 0.7 0.1 - 0.9 x10E3/uL   EOS (ABSOLUTE) 0.2 0.0 - 0.4 x10E3/uL   Basophils Absolute 0.1 0.0 - 0.2 x10E3/uL   Immature Granulocytes 0 %   Immature Grans (Abs) 0.0 0.0 - 0.1 x10E3/uL  Quantiferon tb gold assay     Status: None   Collection Time: 10/15/14  2:23 PM  Result Value Ref Range   QUANTIFERON INCUBATION Comment     Comment: Specimen incubated at [employer],  [place].  QuantiFERON In Tube     Status: None   Collection Time: 10/15/14  2:23 PM  Result Value Ref Range   QUANTIFERON TB GOLD Negative Negative   QUANTIFERON CRITERIA Comment     Comment: To be considered positive a specimen should have a TB Ag minus Nil value greater than or equal to 0.35 IU/mL and in addition the TB Ag minus Nil value must be greater than or equal to 25% of the Nil value. There may be insufficient information in these values to differentiate between some negative and some indeterminate test values.    QUANTIFERON TB AG VALUE 0.03 IU/mL   Quantiferon Nil Value 0.03 IU/mL   QUANTIFERON MITOGEN VALUE 9.29 IU/mL   QFT TB AG MINUS NIL VALUE 0.00 IU/mL   Interpretation: Comment     Comment: The QuantiFERON TB Gold (in Tube) assay is intended for use as an aid in the diagnosis of TB infection. Negative results suggest that there is no TB infection. In patients with high suspicion of exposure, a negative test should be repeated. A positive test indicates infection with Mycobacterium tuberculosis. Among individuals without tuberculosis infection, a positive test may be due to exposure to [place], M. szulgai or M. marinum. On the Internet, go to https://figueroa-lambert.info/ for further details.     Assessment/Plan: Acute bacterial bronchitis W/ some dizziness.   EKG unremarkable.  OVS negative. Will obtain CBC, BMP and CXR today. Rx Doxycycline and albuterol.  Supportive measures discussed.  Continue allergy medication.

## 2014-10-20 NOTE — Progress Notes (Signed)
Pre visit review using our clinic review tool, if applicable. No additional management support is needed unless otherwise documented below in the visit note. 

## 2014-10-20 NOTE — Addendum Note (Signed)
Addended by: France Ravens I on: 10/20/2014 04:34 PM   Modules accepted: Medications

## 2014-10-20 NOTE — Telephone Encounter (Signed)
I have spoken with [facility] and advised that per RAS, labs are ok.  I have faxed Aubagio start form in to [employer]

## 2014-10-21 ENCOUNTER — Telehealth: Payer: Self-pay | Admitting: *Deleted

## 2014-10-21 DIAGNOSIS — B9689 Other specified bacterial agents as the cause of diseases classified elsewhere: Secondary | ICD-10-CM | POA: Insufficient documentation

## 2014-10-21 DIAGNOSIS — J208 Acute bronchitis due to other specified organisms: Principal | ICD-10-CM

## 2014-10-21 NOTE — Assessment & Plan Note (Signed)
W/ some dizziness.  EKG unremarkable.  OVS negative. Will obtain CBC, BMP and CXR today. Rx Doxycycline and albuterol.  Supportive measures discussed.  Continue allergy medication.

## 2014-10-21 NOTE — Telephone Encounter (Signed)
Prior authorization for tussionex pennkinetic ER initiated. Awaiting determination.

## 2014-10-22 NOTE — Telephone Encounter (Signed)
Received fax from [employer] stating PA not required. Form faxed to [facility] at [phone]. Sent for scanning. JG//CMA

## 2014-10-25 ENCOUNTER — Other Ambulatory Visit: Payer: Self-pay | Admitting: Family Medicine

## 2014-10-27 NOTE — Telephone Encounter (Signed)
Med filled.  

## 2014-11-02 ENCOUNTER — Telehealth: Payer: Self-pay

## 2014-11-02 NOTE — Telephone Encounter (Signed)
Patient was approved for the co-pay assist program for Aubagio through [place] [employer] effective until 10/23/2015 ID: [id number] Group: [id number] BIN: [id number]

## 2014-11-03 NOTE — Telephone Encounter (Signed)
[facility]) has extended the approval request for coverage on Aubagio effective until 50/75/7322 or until the policy changes or is terminated Ref # [id number]

## 2014-11-05 ENCOUNTER — Other Ambulatory Visit: Payer: Self-pay | Admitting: Family Medicine

## 2014-11-06 NOTE — Telephone Encounter (Signed)
Med filled.  

## 2014-11-13 ENCOUNTER — Telehealth: Payer: Self-pay | Admitting: Gastroenterology

## 2014-11-14 NOTE — Telephone Encounter (Signed)
Spoke with patient and she would like Dr. Carlean Purl as her new GI MD. Scheduled patient with Tye Savoy, NP on 11/21/14 at 2:00 PM

## 2014-11-21 ENCOUNTER — Ambulatory Visit (INDEPENDENT_AMBULATORY_CARE_PROVIDER_SITE_OTHER): Payer: 59 | Admitting: Nurse Practitioner

## 2014-11-21 ENCOUNTER — Encounter: Payer: Self-pay | Admitting: Nurse Practitioner

## 2014-11-21 VITALS — BP 104/74 | HR 56 | Ht 65.5 in | Wt 212.0 lb

## 2014-11-21 DIAGNOSIS — K5900 Constipation, unspecified: Secondary | ICD-10-CM | POA: Diagnosis not present

## 2014-11-21 MED ORDER — ONDANSETRON HCL 4 MG PO TABS: 4.0000 mg | ORAL_TABLET | Freq: Three times a day (TID) | ORAL | Status: DC | PRN

## 2014-11-21 NOTE — Patient Instructions (Signed)
We have given you a Suprep sample kit for a bowel purge , Please follow instructions on the box We will send you a prescription of Zofran for nausea   Follow up with Dr Carlean Purl on 01/12/2015 Monday at 11:30am

## 2014-11-24 ENCOUNTER — Telehealth: Payer: Self-pay | Admitting: Nurse Practitioner

## 2014-11-24 ENCOUNTER — Encounter: Payer: Self-pay | Admitting: Nurse Practitioner

## 2014-11-24 NOTE — Telephone Encounter (Signed)
Received records from [facility] forwarded to Dr. Tye Savoy 11/24/14 fbg.

## 2014-11-24 NOTE — Progress Notes (Signed)
HPI :  Patient is a 54 year old female, former patient of Dr. Sharlett Iles but not seen here in years. Patient here for evaluation of constipation. She has chronic constipation but it has become increasingly difficult to move bowels. Her last BM was 2-3 weeks ago. She has tried enemas and suppositories by they both cause cramps. Seen at [facility] a few days ago. Abdominal xray done and showed constipation per patient.   Past Medical History  Diagnosis Date  . Hypertension   . Hypothyroidism   . Neuromuscular disorder     MS TX WITH CYMBALTA   . Multiple sclerosis   . H/O colonoscopy   . H/O bone density study 03/2011  . Wears glasses   . Depression   . Hx of radiation therapy 04/17/12- 06/04/12    left chest wall, axilla, L supraclavicular fossa 4500 cGy, left mastectomy scar/chest wall 5940 cGy  . H/O echocardiogram     last one 02/2013, due to chemotherapy  . Sleep apnea     MILD SLEEP APNEA, NO (Needed) MACHINE  6 YRS AGO HPT REGIONAL   . GERD (gastroesophageal reflux disease)   . Breast cancer 11/2011    lul/ER+PR+, x1 lymph node   . Cancer     squamous cell skin cancer x7  . Arthritis     knees   . Anemia     h/o - ? bld. transfusion - more recent- ?2013  . Anxiety     uses xanax mainly for sleep      Family History  Problem Relation Age of Onset  . Breast cancer Paternal Aunt     dx in her 49s  . Heart disease Maternal Grandfather   . Lung cancer Maternal Uncle     smoker  . Breast cancer Paternal Grandmother     dx in her 21s  . Ovarian cancer Paternal Aunt     dx in her 15s  . Arthritis Mother   . Hypertension Mother   . Dementia Father   . Diabetes type II Father    History  Substance Use Topics  . Smoking status: Never Smoker   . Smokeless tobacco: Never Used  . Alcohol Use: No   Current Outpatient Prescriptions  Medication Sig Dispense Refill  . ALPRAZolam (XANAX) 0.5 MG tablet Take 1 tablet (0.5 mg total) by mouth 2 (two) times daily  as needed for anxiety. 60 tablet 5  . amLODipine (NORVASC) 10 MG tablet TAKE 1 TABLET BY MOUTH DAILY 90 tablet 1  . atenolol (TENORMIN) 25 MG tablet TAKE 1/2 TABLET BY MOUTH DAILY BEFORE BREAKFAST. FOR BLOOD PRESSURE 30 tablet 3  . baclofen (LIORESAL) 10 MG tablet Take 1 tablet (10 mg total) by mouth 3 (three) times daily. 90 each 11  . buPROPion (WELLBUTRIN XL) 150 MG 24 hr tablet TAKE 1 TABLET BY MOUTH EVERY DAY 30 tablet 6  . CALCIUM-MAGNESIUM PO Take 1 tablet by mouth daily.    Marland Kitchen docusate sodium (COLACE) 50 MG capsule Take 100 mg by mouth at bedtime.    Marland Kitchen EPINEPHrine 0.3 mg/0.3 mL IJ SOAJ injection Inject 0.3 mLs (0.3 mg total) into the muscle once. 2 Device 2  . gabapentin (NEURONTIN) 300 MG capsule Take 300 mg by mouth 2 (two) times daily.     Marland Kitchen HYDROcodone-acetaminophen (NORCO/VICODIN) 5-325 MG per tablet Take 1 tablet by mouth daily as needed.     Marland Kitchen letrozole (FEMARA) 2.5 MG tablet TAKE 1 TABLET BY MOUTH EVERY  DAY 30 tablet 6  . losartan-hydrochlorothiazide (HYZAAR) 100-25 MG per tablet TAKE 1 TABLET BY MOUTH EVERY MORNING BEFORE BREAKFAST 30 tablet 0  . Multiple Vitamins-Minerals (MULTIVITAMINS THER. W/MINERALS) TABS Take 1 tablet by mouth daily.    Marland Kitchen omeprazole (PRILOSEC) 40 MG capsule Take 1 capsule (40 mg total) by mouth daily. 30 capsule 6  . Probiotic Product (PROBIOTIC DAILY PO) Take 1 capsule by mouth daily.     Marland Kitchen venlafaxine XR (EFFEXOR-XR) 150 MG 24 hr capsule Take 1 capsule (150 mg total) by mouth daily with breakfast. 30 capsule 6  . vitamin E 400 UNIT capsule Take 800 Units by mouth daily.    . ondansetron (ZOFRAN) 4 MG tablet Take 1 tablet (4 mg total) by mouth every 8 (eight) hours as needed for nausea or vomiting (For 72 hours then as needed). 30 tablet 0   No current facility-administered medications for this visit.   Facility-Administered Medications Ordered in Other Visits  Medication Dose Route Frequency Provider Last Rate Last Dose  . ceFAZolin (ANCEF) IVPB 2 g/50  mL premix    PRN Tawni Millers, CRNA   2 g at 04/19/13 0956  . dexamethasone (DECADRON) injection    PRN Tawni Millers, CRNA   8 mg at 05/01/12 0908  . fentaNYL (SUBLIMAZE) injection    PRN Tawni Millers, CRNA   100 mcg at 05/01/12 0856  . lidocaine (cardiac) 100 mg/63ml (XYLOCAINE) 20 MG/ML injection 2%    PRN Tawni Millers, CRNA   60 mg at 05/01/12 0856  . midazolam (VERSED) 5 MG/5ML injection    PRN Tawni Millers, CRNA   2 mg at 05/01/12 0856  . propofol (DIPRIVAN) 10 mg/mL bolus/IV push    PRN Tawni Millers, CRNA   160 mg at 05/01/12 0900   No Known Allergies   Review of Systems: All systems reviewed and negative except where noted in HPI.   Physical Exam: BP 104/74 mmHg  Pulse 56  Ht 5' 5.5" (1.664 m)  Wt 212 lb (96.163 kg)  BMI 34.73 kg/m2  LMP 12/05/2011 Constitutional: Pleasant,well-developed, white female in no acute distress. HEENT: Normocephalic and atraumatic. Conjunctivae are normal. No scleral icterus. Neck supple.  Cardiovascular: Normal rate, regular rhythm.  Pulmonary/chest: Effort normal and breath sounds normal. No wheezing, rales or rhonchi. Abdominal: Soft, nondistended, nontender. Bowel sounds active throughout. There are no masses palpable. No hepatomegaly. Rectal: no stool in vault Extremities: no edema Lymphadenopathy: No cervical adenopathy noted. Neurological: Alert and oriented to person place and time. Skin: Skin is warm and dry. No rashes noted. Psychiatric: Normal mood and affect. Behavior is normal.   ASSESSMENT AND PLAN:  22. 54 year old female with acute on chronic constipation, no significant BM in 2-3 weeks. Enemas / suppositories cause cramps. Will give slow bowel prep to purge bowels. Anti-emetic given to take before bowel prep to help with nausea. Patient will Monday with condition update. She may be a good candidate for Linzess or Amitiza  2. Colon cancer screening. Patient is due but needsto wait until acute issues resolve. Patient  wants Dr. Carlean Purl to assume her care since Dr. Sharlett Iles has retired. Will arrange for office follow up with Dr. Carlean Purl.

## 2014-11-24 NOTE — Telephone Encounter (Signed)
Patient notified that ok that the stools are liquid.  Results are light brown liquid now.  She is advised that is normal after taking a prep.

## 2014-11-25 MED ORDER — LINACLOTIDE 145 MCG PO CAPS: 145.0000 ug | ORAL_CAPSULE | Freq: Every day | ORAL | Status: DC

## 2014-11-25 NOTE — Telephone Encounter (Signed)
Sheri,  Thanks for update. Sounds like she evacuated bowels. As I told patient, she now needs aggressive bowel regimen to prevent this degree of constipation. Please see if insurance with pay for Amitiza 18mcg with meals BID. If not then, Linzess 145 mcg 30 minutes before breakfast. If insurance doesn't cover either then she will need to retry Miralax. She can take it BID. Patient is due for screening colonoscopy but we need to make sure constipation under control before proceeding. If she doesn't have a return appointment please have her at least call in a few weeks to give condition update and scheduled colonoscopy. Thanks

## 2014-11-25 NOTE — Telephone Encounter (Signed)
Patient notified  Baker Pierini is not covered Rx for linzess is sent in  She has follow up already scheduled with Carlean Purl for 01/12/15

## 2014-11-28 ENCOUNTER — Telehealth: Payer: Self-pay | Admitting: Nurse Practitioner

## 2014-12-01 ENCOUNTER — Telehealth: Payer: Self-pay | Admitting: Nurse Practitioner

## 2014-12-01 NOTE — Telephone Encounter (Signed)
I have contacted the pharmacy for prior auth form

## 2014-12-01 NOTE — Telephone Encounter (Signed)
PA has been approved [id number] approved 06/02/15.  Patient notified to pick up rx at the pharmacy.  She will call back if this does not help with her constipation

## 2014-12-01 NOTE — Telephone Encounter (Signed)
I contacted the pharmacy for the prior auth form.  See phone note from 12/01/14 for additional details

## 2014-12-04 ENCOUNTER — Telehealth: Payer: Self-pay | Admitting: Nurse Practitioner

## 2014-12-04 NOTE — Telephone Encounter (Signed)
Patient reports that she has taken 4 doses of linzess with no results.  She is advised to add Miralax qd or BID until BM.  She is advised that I will have Julie Ross RNP review and we will call with her recommendations when she returns

## 2014-12-04 NOTE — Progress Notes (Signed)
Agree with Ms. Guenther's assessment and plan. Carl E. Gessner, MD, FACG   

## 2014-12-08 ENCOUNTER — Telehealth: Payer: Self-pay | Admitting: Family Medicine

## 2014-12-08 NOTE — Telephone Encounter (Signed)
Patient called in stating that she hasn't had a bowel movement in three weeks. Call transferred to [facility].

## 2014-12-08 NOTE — Telephone Encounter (Signed)
Patient reports that she had a small solid stool with bowel purge then liquid.  She reports that she still has not had a BM with Miralax or Linzess.  Has now taken 7 doses of Miralax.  She reports that she is having pain and cramping.

## 2014-12-08 NOTE — Telephone Encounter (Signed)
[facility] - Client TELEPHONE ADVICE RECORD [facility]  Patient Name: Julie Ross  DOB: 1960/10/08    Initial Comment Caller states has not a bm in 3 weeks.    Nurse Assessment  Nurse: Justine Null, RN, Rodena Piety Date/Time Eilene Ghazi Time): 12/08/2014 4:13:04 PM  Confirm and document reason for call. If symptomatic, describe symptoms. ---Caller states has not a BM in 2 weeks. and has been able to eat only baked potatoes and has baked chicken and has been having the nauseated feelings and has been seen in the office by the NP on 06/0 08/2014 and has been directed to take the medication and has been given took the first bottle and has taken the second bottle and only had a liquid stools and has been on Linzeness and has been taking Miralax since Thursday and has had stomach cramping and has been having no fevers no vomiting and has nauseated feeling  Has the patient traveled out of the country within the last 30 days? ---No  Does the patient require triage? ---Yes  Related visit to physician within the last 2 weeks? ---Yes  Does the PT have any chronic conditions? (i.e. diabetes, asthma, etc.) ---Yes  List chronic conditions. ---HTN MS breast cancer     Guidelines    Guideline Title Affirmed Question Affirmed Notes  Abdominal Pain - Female [1] SEVERE pain (e.g., excruciating) AND [2] present > 1 hour pain rated at 8/10   Final Disposition User   Go to ED Now Justine Null, RN, Rodena Piety

## 2014-12-09 ENCOUNTER — Encounter (HOSPITAL_COMMUNITY): Payer: Self-pay | Admitting: *Deleted

## 2014-12-09 ENCOUNTER — Telehealth: Payer: Self-pay | Admitting: Family

## 2014-12-09 ENCOUNTER — Emergency Department (HOSPITAL_COMMUNITY): Payer: 59

## 2014-12-09 ENCOUNTER — Emergency Department (HOSPITAL_COMMUNITY)
Admission: EM | Admit: 2014-12-09 | Discharge: 2014-12-09 | Disposition: A | Discharge status: Home / Self Care | Payer: 59 | Attending: Emergency Medicine | Admitting: Emergency Medicine

## 2014-12-09 DIAGNOSIS — Z79899 Other long term (current) drug therapy: Secondary | ICD-10-CM | POA: Insufficient documentation

## 2014-12-09 DIAGNOSIS — Z973 Presence of spectacles and contact lenses: Secondary | ICD-10-CM | POA: Insufficient documentation

## 2014-12-09 DIAGNOSIS — F419 Anxiety disorder, unspecified: Secondary | ICD-10-CM | POA: Diagnosis not present

## 2014-12-09 DIAGNOSIS — Z85828 Personal history of other malignant neoplasm of skin: Secondary | ICD-10-CM | POA: Diagnosis not present

## 2014-12-09 DIAGNOSIS — F329 Major depressive disorder, single episode, unspecified: Secondary | ICD-10-CM | POA: Diagnosis not present

## 2014-12-09 DIAGNOSIS — Z791 Long term (current) use of non-steroidal anti-inflammatories (NSAID): Secondary | ICD-10-CM | POA: Diagnosis not present

## 2014-12-09 DIAGNOSIS — K5900 Constipation, unspecified: Secondary | ICD-10-CM | POA: Diagnosis not present

## 2014-12-09 DIAGNOSIS — G709 Myoneural disorder, unspecified: Secondary | ICD-10-CM | POA: Insufficient documentation

## 2014-12-09 DIAGNOSIS — E039 Hypothyroidism, unspecified: Secondary | ICD-10-CM | POA: Diagnosis not present

## 2014-12-09 DIAGNOSIS — K219 Gastro-esophageal reflux disease without esophagitis: Secondary | ICD-10-CM | POA: Diagnosis not present

## 2014-12-09 DIAGNOSIS — M1990 Unspecified osteoarthritis, unspecified site: Secondary | ICD-10-CM | POA: Insufficient documentation

## 2014-12-09 DIAGNOSIS — Z862 Personal history of diseases of the blood and blood-forming organs and certain disorders involving the immune mechanism: Secondary | ICD-10-CM | POA: Diagnosis not present

## 2014-12-09 DIAGNOSIS — Z853 Personal history of malignant neoplasm of breast: Secondary | ICD-10-CM | POA: Diagnosis not present

## 2014-12-09 DIAGNOSIS — I10 Essential (primary) hypertension: Secondary | ICD-10-CM | POA: Insufficient documentation

## 2014-12-09 DIAGNOSIS — Z9889 Other specified postprocedural states: Secondary | ICD-10-CM | POA: Insufficient documentation

## 2014-12-09 LAB — COMPREHENSIVE METABOLIC PANEL
ALT: 15 U/L (ref 14–54)
AST: 20 U/L (ref 15–41)
Albumin: 4.1 g/dL (ref 3.5–5.0)
Alkaline Phosphatase: 132 U/L — ABNORMAL HIGH (ref 38–126)
Anion gap: 13 (ref 5–15)
BUN: 15 mg/dL (ref 6–20)
CO2: 25 mmol/L (ref 22–32)
Calcium: 9.1 mg/dL (ref 8.9–10.3)
Chloride: 96 mmol/L — ABNORMAL LOW (ref 101–111)
Creatinine, Ser: 1.25 mg/dL — ABNORMAL HIGH (ref 0.44–1.00)
GFR calc Af Amer: 55 mL/min — ABNORMAL LOW (ref >60)
GFR calc non Af Amer: 47 mL/min — ABNORMAL LOW (ref >60)
Glucose, Bld: 92 mg/dL (ref 65–99)
Potassium: 3.1 mmol/L — ABNORMAL LOW (ref 3.5–5.1)
Sodium: 134 mmol/L — ABNORMAL LOW (ref 135–145)
Total Bilirubin: 0.7 mg/dL (ref 0.3–1.2)
Total Protein: 7 g/dL (ref 6.5–8.1)

## 2014-12-09 LAB — CBC
HCT: 35.8 % — ABNORMAL LOW (ref 36.0–46.0)
Hemoglobin: 12.3 g/dL (ref 12.0–15.0)
MCH: 28.5 pg (ref 26.0–34.0)
MCHC: 34.4 g/dL (ref 30.0–36.0)
MCV: 83.1 fL (ref 78.0–100.0)
Platelets: 229 10*3/uL (ref 150–400)
RBC: 4.31 MIL/uL (ref 3.87–5.11)
RDW: 13.4 % (ref 11.5–15.5)
WBC: 6 10*3/uL (ref 4.0–10.5)

## 2014-12-09 MED ORDER — IOHEXOL 300 MG/ML  SOLN
100.0000 mL | Freq: Once | INTRAMUSCULAR | Status: AC | PRN
  Administered 2014-12-09: 100 mL via INTRAVENOUS

## 2014-12-09 MED ORDER — MILK AND MOLASSES ENEMA
1.0000 | Freq: Once | RECTAL | Status: DC
  Filled 2014-12-09: qty 250

## 2014-12-09 MED ORDER — IOHEXOL 300 MG/ML  SOLN
25.0000 mL | INTRAMUSCULAR | Status: AC
  Administered 2014-12-09: 25 mL via ORAL

## 2014-12-09 MED ORDER — IOHEXOL 300 MG/ML  SOLN: 25.0000 mL | INTRAMUSCULAR | Status: DC

## 2014-12-09 MED ORDER — SODIUM CHLORIDE 0.9 % IV BOLUS (SEPSIS)
1000.0000 mL | Freq: Once | INTRAVENOUS | Status: AC
  Administered 2014-12-09: 1000 mL via INTRAVENOUS

## 2014-12-09 NOTE — Discharge Instructions (Signed)

## 2014-12-09 NOTE — Telephone Encounter (Signed)
Sheri, please have her come for kub. Let's see how much stool is in colon before we move on. Thanks

## 2014-12-09 NOTE — ED Provider Notes (Signed)
CSN: [id number]     Arrival date & time 12/09/14  4401 History   First MD Initiated Contact with Patient 12/09/14 [id number]     Chief Complaint  Patient presents with  . Constipation     (Consider location/radiation/quality/duration/timing/severity/associated sxs/prior Treatment) HPI Comments: Here with severe constipation. Last dominant yesterday, was watery. Prior to that, last stool was about 1-2 weeks ago. His been dealing with this for the past month. She has had no relief with MiraLAX, Colace, Linzess. Hx of breast cancer in 2013.  Patient is a 54 y.o. female presenting with constipation. The history is provided by the patient.  Constipation Severity:  Severe Time since last bowel movement:  1 day Timing:  Constant Progression:  Unchanged Chronicity:  New Context: not dehydration, not dietary changes, not medication, not narcotics and not stress   Stool description:  Watery Unusual stool frequency:  Once a week Relieved by:  Nothing Ineffective treatments:  Miralax, stool softeners and laxatives (Linzess) Associated symptoms: abdominal pain (left sided)   Associated symptoms: no diarrhea, no fever and no urinary retention   Risk factors: no change in medication and no hx of abdominal surgery (stopped narcotics 2 months ago)     Past Medical History  Diagnosis Date  . Hypertension   . Hypothyroidism   . Neuromuscular disorder     MS TX WITH CYMBALTA   . Multiple sclerosis   . H/O colonoscopy   . H/O bone density study 03/2011  . Wears glasses   . Heart burn   . Depression   . Hx of radiation therapy 04/17/12- 06/04/12    left chest wall, axilla, L supraclavicular fossa 4500 cGy, left mastectomy scar/chest wall 5940 cGy  . H/O echocardiogram     last one 02/2013, due to chemotherapy  . Sleep apnea     MILD SLEEP APNEA, NO (Needed) MACHINE  6 YRS AGO HPT [facility]   . GERD (gastroesophageal reflux disease)   . Breast cancer 11/2011    lul/ER+PR+, x1 lymph node   . Cancer      squamous cell skin cancer x7  . Arthritis     knees   . Anemia     h/o - ? bld. transfusion - more recent- ?2013  . Anxiety     uses xanax mainly for sleep   Past Surgical History  Procedure Laterality Date  . Tonsillectomy    . Tumor removal      FROM PELVIS AGE 40  . Cesarean section      X2   . No past surgeries      BLADDER SLING  4-5 YRS AGO   . Knee arthroscopy      LT KNEE 04/2011  . Total knee arthroplasty  08/15/2011    Procedure: TOTAL KNEE ARTHROPLASTY; lft Surgeon: Alta Corning, MD;  Location: [place];  Service: Orthopedics;  Laterality: Left;  RIGHT KNEE CORTIZONE INJECTION  . Mohs surgery      right face  . Knee arthroscopy  82    rt  . Mastectomy w/ sentinel node biopsy  12/19/2011    Procedure: MASTECTOMY WITH SENTINEL LYMPH NODE BIOPSY;  Surgeon: Haywood Lasso, MD;  Location: [place];  Service: General;  Laterality: Left;  left breast and left axilla   . Portacath placement  01/16/2012    Procedure: INSERTION PORT-A-CATH;  Surgeon: Haywood Lasso, MD;  Location: [place];  Service: General;  Laterality: Right;  Dupree Cath Placement   .  I&d knee with poly exchange  03/02/2012    Procedure: IRRIGATION AND DEBRIDEMENT KNEE WITH POLY EXCHANGE;  Surgeon: Alta Corning, MD;  Location: [place];  Service: Orthopedics;  Laterality: Left;  I&D of total knee with Possible Poly Exchange   . Tee without cardioversion  03/06/2012    Procedure: TRANSESOPHAGEAL ECHOCARDIOGRAM (TEE);  Surgeon: Josue Hector, MD;  Location: [place];  Service: Cardiovascular;  Laterality: N/A;  Rm. 2927  . Port-a-cath removal  03/06/2012    Procedure: REMOVAL PORT-A-CATH;  Surgeon: Odis Hollingshead, MD;  Location: [place];  Service: General;  Laterality: N/A;  . Portacath placement  05/01/2012    Procedure: INSERTION PORT-A-CATH;  Surgeon: Haywood Lasso, MD;  Location: [place];  Service: General;  Laterality: Right;  right internal jugular port-a-cath  insertion  . Total knee arthroplasty Left 08/18/2012    Procedure:  Irrigation and debridement of LEFT total knee;  Removal of total knee parts; Implant of spacers;  Surgeon: Kerin Salen, MD;  Location: [place];  Service: Orthopedics;  Laterality: Left;  . Port-a-cath removal Right 08/22/2012    Procedure: REMOVAL PORT-A-CATH;  Surgeon: Edward Jolly, MD;  Location: [place];  Service: General;  Laterality: Right;  . Joint replacement  Feb 2013    left knee- multiple surgeries   . Mastectomy    . Total knee revision Left 04/19/2013    Procedure: TOTAL KNEE REVISION AND REMOVAL CEMENT SPACER;  Surgeon: Kerin Salen, MD;  Location: [place];  Service: Orthopedics;  Laterality: Left;  . Total knee arthroplasty Right 08/12/2013  . Total knee arthroplasty Right 08/12/2013    Procedure: RIGHT TOTAL KNEE ARTHROPLASTY;  Surgeon: Kerin Salen, MD;  Location: [place];  Service: Orthopedics;  Laterality: Right;   Family History  Problem Relation Age of Onset  . Breast cancer Paternal Aunt     dx in her 94s  . Heart disease Maternal Grandfather   . Lung cancer Maternal Uncle     smoker  . Breast cancer Paternal Grandmother     dx in her 39s  . Ovarian cancer Paternal Aunt     dx in her 65s  . Arthritis Mother   . Hypertension Mother   . Dementia Father   . Diabetes type II Father    History  Substance Use Topics  . Smoking status: Never Smoker   . Smokeless tobacco: Never Used  . Alcohol Use: No   OB History    No data available     Review of Systems  Constitutional: Negative for fever.  Respiratory: Negative for cough and shortness of breath.   Gastrointestinal: Positive for abdominal pain (left sided) and constipation. Negative for diarrhea.  All other systems reviewed and are negative.     Allergies  Review of patient's allergies indicates no known allergies.  Home Medications   Prior to Admission medications   Medication Sig Start Date End Date Taking? Authorizing Provider   ALPRAZolam Duanne Moron) 0.5 MG tablet Take 1 tablet (0.5 mg total) by mouth 2 (two) times daily as needed for anxiety. 10/15/14  Yes Britt Bottom, MD  amLODipine (NORVASC) 10 MG tablet TAKE 1 TABLET BY MOUTH DAILY 11/06/14  Yes Midge Minium, MD  atenolol (TENORMIN) 25 MG tablet TAKE 1/2 TABLET BY MOUTH DAILY BEFORE BREAKFAST. FOR BLOOD PRESSURE 10/27/14  Yes Midge Minium, MD  baclofen (LIORESAL) 10 MG tablet Take 1 tablet (10 mg total) by mouth 3 (three) times  daily. 10/15/14  Yes Britt Bottom, MD  buPROPion (WELLBUTRIN XL) 150 MG 24 hr tablet TAKE 1 TABLET BY MOUTH EVERY DAY 10/06/14  Yes Midge Minium, MD  CALCIUM-MAGNESIUM PO Take 1 tablet by mouth daily.   Yes Historical Provider, MD  docusate sodium (COLACE) 50 MG capsule Take 100 mg by mouth at bedtime.   Yes Historical Provider, MD  EPINEPHrine 0.3 mg/0.3 mL IJ SOAJ injection Inject 0.3 mLs (0.3 mg total) into the muscle once. 08/12/14  Yes Midge Minium, MD  gabapentin (NEURONTIN) 300 MG capsule Take 300 mg by mouth 2 (two) times daily.    Yes Historical Provider, MD  HYDROcodone-acetaminophen (NORCO/VICODIN) 5-325 MG per tablet Take 1 tablet by mouth daily as needed for moderate pain or severe pain.  09/23/14  Yes Historical Provider, MD  ibuprofen (ADVIL,MOTRIN) 200 MG tablet Take 600 mg by mouth every 6 (six) hours as needed for mild pain or moderate pain.   Yes Historical Provider, MD  letrozole (FEMARA) 2.5 MG tablet TAKE 1 TABLET BY MOUTH EVERY DAY 02/21/14  Yes Nicholas Lose, MD  Linaclotide (LINZESS) 145 MCG CAPS capsule Take 1 capsule (145 mcg total) by mouth daily. 11/25/14  Yes Willia Craze, NP  losartan-hydrochlorothiazide (HYZAAR) 100-25 MG per tablet TAKE 1 TABLET BY MOUTH EVERY MORNING BEFORE BREAKFAST 03/10/14  Yes Midge Minium, MD  Multiple Vitamins-Minerals (MULTIVITAMINS THER. W/MINERALS) TABS Take 1 tablet by mouth daily.   Yes Historical Provider, MD  omeprazole (PRILOSEC) 40 MG capsule Take 1 capsule  (40 mg total) by mouth daily. 08/12/14  Yes Midge Minium, MD  ondansetron (ZOFRAN) 4 MG tablet Take 1 tablet (4 mg total) by mouth every 8 (eight) hours as needed for nausea or vomiting (For 72 hours then as needed). 11/21/14  Yes Willia Craze, NP  Probiotic Product (PROBIOTIC DAILY PO) Take 1 capsule by mouth daily.    Yes Historical Provider, MD  venlafaxine XR (EFFEXOR-XR) 150 MG 24 hr capsule Take 1 capsule (150 mg total) by mouth daily with breakfast. 10/07/14  Yes Midge Minium, MD  vitamin E 400 UNIT capsule Take 800 Units by mouth daily.   Yes Historical Provider, MD   BP 119/53 mmHg  Pulse 52  Temp(Src) 99.1 F (37.3 C) (Oral)  Resp 18  Ht 5\' 6"  (1.676 m)  Wt 210 lb (95.255 kg)  BMI 33.91 kg/m2  SpO2 97%  LMP 12/05/2011 Physical Exam  Constitutional: She is oriented to person, place, and time. She appears well-developed and well-nourished. No distress.  HENT:  Head: Normocephalic and atraumatic.  Mouth/Throat: Oropharynx is clear and moist.  Eyes: EOM are normal. Pupils are equal, round, and reactive to light.  Neck: Normal range of motion. Neck supple.  Cardiovascular: Normal rate and regular rhythm.  Exam reveals no friction rub.   No murmur heard. Pulmonary/Chest: Effort normal and breath sounds normal. No respiratory distress. She has no wheezes. She has no rales.  Abdominal: Soft. She exhibits no distension. There is tenderness (LUQ, L sided, LLQ). There is no rebound and no guarding.  Musculoskeletal: Normal range of motion. She exhibits no edema.  Neurological: She is alert and oriented to person, place, and time. No cranial nerve deficit. She exhibits normal muscle tone. Coordination normal.  Skin: No rash noted. She is not diaphoretic.  Nursing note and vitals reviewed.   ED Course  Procedures (including critical care time) Labs Review Labs Reviewed  CBC - Abnormal; Notable for the following:  HCT 35.8 (*)    All other components within normal  limits  COMPREHENSIVE METABOLIC PANEL - Abnormal; Notable for the following:    Sodium 134 (*)    Potassium 3.1 (*)    Chloride 96 (*)    Creatinine, Ser 1.25 (*)    Alkaline Phosphatase 132 (*)    GFR calc non Af Amer 47 (*)    GFR calc Af Amer 55 (*)    All other components within normal limits    Imaging Review Ct Abdomen Pelvis W Contrast  12/09/2014   CLINICAL DATA:  54 year old female with left upper quadrant pain and nausea. Constipation for several weeks. Initial encounter.  Personal history of left breast cancer.  EXAM: CT ABDOMEN AND PELVIS WITH CONTRAST  TECHNIQUE: Multidetector CT imaging of the abdomen and pelvis was performed using the standard protocol following bolus administration of intravenous contrast.  CONTRAST:  156mL OMNIPAQUE IOHEXOL 300 MG/ML  SOLN  COMPARISON:  Chest abdomen and pelvis CT 12/28/2012.  FINDINGS: Stable mild cardiomegaly. No pericardial or pleural effusion. Negative lung bases.  Sequelae of left mastectomy. Degenerative changes in the spine. No acute or suspicious osseous lesion identified.  No pelvic free fluid. Negative uterus and adnexa. Stable and negative rectum.  Decompressed distal sigmoid colon. Highly redundant sigmoid colon which extends into the epigastric region and is mildly gas distended. Decompressed sigmoid junction with the descending colon. Redundant left colon and splenic flexure with retained stool throughout. Oral contrast has reached the hepatic flexure. Retained stool in the right colon. Normal retrocecal appendix. Negative terminal ileum. No dilated small bowel.  Decompressed stomach. Small hiatal hernia is stable. Negative duodenum.  Liver, gallbladder, spleen, and adrenal glands are within normal limits. Fatty infiltration of the pancreas again noted. No pancreatic inflammation. No abdominal free fluid. Portal venous system is patent.  Aortoiliac calcified atherosclerosis noted. Major arterial structures in the abdomen and pelvis are  patent. No lymphadenopathy.  Renal enhancement and contrast excretion stable and within normal limits.  IMPRESSION: 1. No acute or inflammatory process in the abdomen or pelvis. 2. Redundant colon with gas in the sigmoid and retained stool more proximally.   Electronically Signed   By: Genevie Ann M.D.   On: 12/09/2014 12:23     EKG Interpretation None      MDM   Final diagnoses:  Constipation, unspecified constipation type    77 show female here with constipation. Present for 3-4 weeks. Has had intermittent liquid stools, lastly yesterday. No relief with prescription and over-the-counter medications. History of breast cancer. She is also stating some nausea, decreased oral intake, and left-sided abdominal pain. On exam she has mild tenderness to left upper quadrant, left flank, left lower quadrant. No rebound or guarding. She deferred a rectal exam. She said she had one and it was normal. We'll give her an enema here and scan her belly to ensure no metastatic disease causing a bowel obstruction. CT negative. Patient refused enema. CT does show retained stool, but she's decompressed distally, so doubt enema will help. No SBO or metastatic lesion.  Instructed to take Miralax. Stable for discharge.   Evelina Bucy, MD 12/09/14 1258

## 2014-12-09 NOTE — ED Notes (Addendum)
Pt refusing enema at current time. States she wants to have the CT abdomen completed before doing that. This RN was unable to obtain enough blood off of IV for CMP. Lab notified.

## 2014-12-09 NOTE — ED Notes (Signed)
Patient transported to CT 

## 2014-12-09 NOTE — ED Notes (Signed)
Pt reports being constipated x 3 weeks and no relief with laxatives.

## 2014-12-09 NOTE — Telephone Encounter (Signed)
Please contact pt to arrange a 2 day ED follow up.

## 2014-12-09 NOTE — ED Notes (Signed)
Pt has finished CT contrast. CT notified. Lab at bedside drawing CMP.

## 2014-12-09 NOTE — ED Notes (Signed)
Pt is back from CT

## 2014-12-10 NOTE — Telephone Encounter (Signed)
Per chart, pt went to the ER.

## 2014-12-10 NOTE — Telephone Encounter (Signed)
Agree w/ advice given 

## 2014-12-10 NOTE — Telephone Encounter (Signed)
Spoke with patient who states she still has not really had a bowel movement.  She is still experiencing abdominal cramping, bloating, and discomfort.  She was encouraged to follow up with her PCP this week.  Pt declined stating that she has an appointment with Dr. Birdie Riddle on 12/17/14 and will follow up with her then.  In the meantime she is increasing Mirlax to 3 times a day.  She was also encouraged to increase fluid intake (more warm fluids than cold), consume more fiber (fruits/veggies), and increase activity.  She was also advised if symptoms worsen to either call office or go to ER.  Pt stated understanding and agreed.

## 2014-12-16 ENCOUNTER — Other Ambulatory Visit: Payer: Self-pay | Admitting: Hematology and Oncology

## 2014-12-16 ENCOUNTER — Telehealth: Payer: Self-pay | Admitting: Behavioral Health

## 2014-12-16 ENCOUNTER — Encounter: Payer: Self-pay | Admitting: Behavioral Health

## 2014-12-16 NOTE — Telephone Encounter (Signed)
Unable to reach patient at time of Pre-Visit Call.  Left message for patient to return call when available.    

## 2014-12-16 NOTE — Telephone Encounter (Signed)
Pre-Visit Call completed with patient and chart updated.   Pre-Visit Info documented in Specialty Comments under [employer].    

## 2014-12-16 NOTE — Telephone Encounter (Signed)
Last ov 07/28/14.  Next ov 03/19/15.  Chart reviewed

## 2014-12-16 NOTE — Addendum Note (Signed)
Addended by: Eduard Roux E on: 12/16/2014 04:00 PM   Modules accepted: Medications

## 2014-12-17 ENCOUNTER — Encounter: Payer: Self-pay | Admitting: Family Medicine

## 2014-12-17 ENCOUNTER — Ambulatory Visit (INDEPENDENT_AMBULATORY_CARE_PROVIDER_SITE_OTHER): Payer: 59 | Admitting: Family Medicine

## 2014-12-17 VITALS — BP 124/68 | HR 67 | Temp 97.9°F | Resp 16 | Ht 66.5 in | Wt 210.2 lb

## 2014-12-17 DIAGNOSIS — K5909 Other constipation: Secondary | ICD-10-CM

## 2014-12-17 DIAGNOSIS — T50905A Adverse effect of unspecified drugs, medicaments and biological substances, initial encounter: Secondary | ICD-10-CM

## 2014-12-17 DIAGNOSIS — R7989 Other specified abnormal findings of blood chemistry: Secondary | ICD-10-CM | POA: Diagnosis not present

## 2014-12-17 DIAGNOSIS — K5903 Drug induced constipation: Secondary | ICD-10-CM

## 2014-12-17 DIAGNOSIS — Z0000 Encounter for general adult medical examination without abnormal findings: Secondary | ICD-10-CM

## 2014-12-17 MED ORDER — LACTULOSE 10 GM/15ML PO SOLN: 20.0000 g | Freq: Two times a day (BID) | ORAL | Status: DC

## 2014-12-17 MED ORDER — LINACLOTIDE 290 MCG PO CAPS: 290.0000 ug | ORAL_CAPSULE | Freq: Every day | ORAL | Status: DC

## 2014-12-17 NOTE — Progress Notes (Signed)
Pre visit review using our clinic review tool, if applicable. No additional management support is needed unless otherwise documented below in the visit note. 

## 2014-12-17 NOTE — Patient Instructions (Signed)
Follow up in 6 months to recheck BP We'll notify you of your lab results and make any changes if needed Increase the Linzess to 290 daily- 2 of what you have at home and 1 of the new prescription Start the Lactulose- 46ml twice daily.  STOP the miralax Drink plenty of fluids Call with any questions or concerns- particularly if no improvement in bowel symptoms (we'll see if we can get you into GI sooner) Hang in there!

## 2014-12-17 NOTE — Assessment & Plan Note (Signed)
Pt's PE unchanged from previous and WNL w/ exception of R mastectomy and abdominal distention.  UTD on colonoscopy, mammo, DEXA.  Check labs.  Anticipatory guidance provided.

## 2014-12-17 NOTE — Progress Notes (Signed)
   Subjective:    Patient ID: Julie Ross, female    DOB: Feb 22, 1961, 54 y.o.   MRN: [id number]  HPI CPE- UTD on GYN Juanda [facility], [facility]), mammo (Aug 2015) GI Carlean Purl since [place] retired)   Review of Systems Patient reports no vision/ hearing changes, adenopathy,fever, weight change,  persistant/recurrent hoarseness , swallowing issues, chest pain, palpitations, edema, persistant/recurrent cough, hemoptysis, dyspnea (rest/exertional/paroxysmal nocturnal), gastrointestinal bleeding (melena, rectal bleeding), abdominal pain, significant heartburn, bowel changes, GU symptoms (dysuria, hematuria, incontinence), Gyn symptoms (abnormal  bleeding, pain),  syncope, focal weakness, memory loss, numbness & tingling, skin/hair/nail changes, abnormal bruising or bleeding, anxiety, or depression.   + chronic constipation- pt is on Linzess and Miralax in combo w/o relief.  Went to ER on 6/21 and CT showed constipation w/o obstruction.  Pt was advised to increase Miralax to TID.  Pt having severe cramping but limited stools.  + nausea.    Objective:   Physical Exam General Appearance:    Alert, cooperative, no distress, appears stated age  Head:    Normocephalic, without obvious abnormality, atraumatic  Eyes:    PERRL, conjunctiva/corneas clear, EOM's intact, fundi    benign, both eyes  Ears:    Normal TM's and external ear canals, both ears  Nose:   Nares normal, septum midline, mucosa normal, no drainage    or sinus tenderness  Throat:   Lips, mucosa, and tongue normal; teeth and gums normal  Neck:   Supple, symmetrical, trachea midline, no adenopathy;    Thyroid: no enlargement/tenderness/nodules  Back:     Symmetric, no curvature, ROM normal, no CVA tenderness  Lungs:     Clear to auscultation bilaterally, respirations unlabored  Chest Wall:    No tenderness or deformity   Heart:    Regular rate and rhythm, S1 and S2 normal, no murmur, rub   or gallop  Breast Exam:     Deferred to GYN  Abdomen:     Distended, normal bowel sounds, not tender  Genitalia:    Deferred to GYN  Rectal:    Extremities:   Extremities normal, atraumatic, no cyanosis or edema  Pulses:   2+ and symmetric all extremities  Skin:   Skin color, texture, turgor normal, no rashes or lesions  Lymph nodes:   Cervical, supraclavicular, and axillary nodes normal  Neurologic:   CNII-XII intact, normal strength, sensation and reflexes    throughout          Assessment & Plan:

## 2014-12-17 NOTE — Assessment & Plan Note (Signed)
Deteriorated.  Severity of sxs required pt to go to ER on 6/21.  Interventions- increasing Miralax to TID- have not been successful and she is having side effects (abdominal cramping).  Pt has appt w/ GI but not for another month.  Will increase Linzess to 290mg  daily and switch from Miralax to Lactulose in hopes that we can move bowels and improve her sxs.  If no improvement, pt to notify me so we can try and expedite her GI referral.  Pt expressed understanding and is in agreement w/ plan.

## 2014-12-18 LAB — CBC WITH DIFFERENTIAL/PLATELET
Basophils Absolute: 0 10*3/uL (ref 0.0–0.1)
Basophils Relative: 0.6 % (ref 0.0–3.0)
Eosinophils Absolute: 0.2 10*3/uL (ref 0.0–0.7)
Eosinophils Relative: 2.3 % (ref 0.0–5.0)
HCT: 40.2 % (ref 36.0–46.0)
Hemoglobin: 13.5 g/dL (ref 12.0–15.0)
Lymphocytes Relative: 18.4 % (ref 12.0–46.0)
Lymphs Abs: 1.6 10*3/uL (ref 0.7–4.0)
MCHC: 33.5 g/dL (ref 30.0–36.0)
MCV: 84.6 fl (ref 78.0–100.0)
Monocytes Absolute: 0.5 10*3/uL (ref 0.1–1.0)
Monocytes Relative: 5.8 % (ref 3.0–12.0)
Neutro Abs: 6.2 10*3/uL (ref 1.4–7.7)
Neutrophils Relative %: 72.9 % (ref 43.0–77.0)
Platelets: 274 10*3/uL (ref 150.0–400.0)
RBC: 4.76 Mil/uL (ref 3.87–5.11)
RDW: 14.2 % (ref 11.5–15.5)
WBC: 8.5 10*3/uL (ref 4.0–10.5)

## 2014-12-18 LAB — BASIC METABOLIC PANEL
BUN: 16 mg/dL (ref 6–23)
CO2: 26 mEq/L (ref 19–32)
Calcium: 9.5 mg/dL (ref 8.4–10.5)
Chloride: 98 mEq/L (ref 96–112)
Creatinine, Ser: 1.35 mg/dL — ABNORMAL HIGH (ref 0.40–1.20)
GFR: 43.07 mL/min — ABNORMAL LOW (ref >60.00)
Glucose, Bld: 118 mg/dL — ABNORMAL HIGH (ref 70–99)
Potassium: 3.1 mEq/L — ABNORMAL LOW (ref 3.5–5.1)
Sodium: 135 mEq/L (ref 135–145)

## 2014-12-18 LAB — HEPATIC FUNCTION PANEL
ALT: 13 U/L (ref 0–35)
AST: 17 U/L (ref 0–37)
Albumin: 4.4 g/dL (ref 3.5–5.2)
Alkaline Phosphatase: 135 U/L — ABNORMAL HIGH (ref 39–117)
Bilirubin, Direct: 0.1 mg/dL (ref 0.0–0.3)
Total Bilirubin: 0.6 mg/dL (ref 0.2–1.2)
Total Protein: 7.5 g/dL (ref 6.0–8.3)

## 2014-12-18 LAB — LIPID PANEL
Cholesterol: 244 mg/dL — ABNORMAL HIGH (ref 0–200)
HDL: 55.7 mg/dL (ref >39.00)
NonHDL: 188.3
Total CHOL/HDL Ratio: 4
Triglycerides: 219 mg/dL — ABNORMAL HIGH (ref 0.0–149.0)
VLDL: 43.8 mg/dL — ABNORMAL HIGH (ref 0.0–40.0)

## 2014-12-18 LAB — TSH: TSH: 5.22 u[IU]/mL — ABNORMAL HIGH (ref 0.35–4.50)

## 2014-12-18 LAB — LDL CHOLESTEROL, DIRECT: Direct LDL: 158 mg/dL

## 2014-12-18 LAB — VITAMIN D 25 HYDROXY (VIT D DEFICIENCY, FRACTURES): VITD: 24.16 ng/mL — ABNORMAL LOW (ref 30.00–100.00)

## 2014-12-18 NOTE — Telephone Encounter (Signed)
I have been waiting on patient to come by for KUB to see where we were with the constipation. She has since ended up in ED, please see CTscan. She is having a very hard time with constipation despite linzess, miralax and other things. Do you have any suggestions. She has a very redundant colon.

## 2014-12-18 NOTE — Telephone Encounter (Signed)
She should do a MiraLax purge and let us know if she can clean out with that - she should do that and call back with results  Needs an anorectal manometry evaluation also -   She needs to arrange a colonoscopy also as you have stated but not urgent  I am thinking she may need to see Dr. Marcello Moores re: redundant colon problems   Need to know if anorectum are worling properly

## 2014-12-19 ENCOUNTER — Other Ambulatory Visit: Payer: Self-pay | Admitting: General Practice

## 2014-12-19 ENCOUNTER — Other Ambulatory Visit (INDEPENDENT_AMBULATORY_CARE_PROVIDER_SITE_OTHER): Payer: 59

## 2014-12-19 DIAGNOSIS — R739 Hyperglycemia, unspecified: Secondary | ICD-10-CM

## 2014-12-19 DIAGNOSIS — E785 Hyperlipidemia, unspecified: Secondary | ICD-10-CM

## 2014-12-19 DIAGNOSIS — E038 Other specified hypothyroidism: Secondary | ICD-10-CM

## 2014-12-19 DIAGNOSIS — R7309 Other abnormal glucose: Secondary | ICD-10-CM | POA: Diagnosis not present

## 2014-12-19 DIAGNOSIS — E876 Hypokalemia: Secondary | ICD-10-CM

## 2014-12-19 LAB — HEMOGLOBIN A1C: Hgb A1c MFr Bld: 5.8 % (ref 4.6–6.5)

## 2014-12-19 MED ORDER — ATORVASTATIN CALCIUM 20 MG PO TABS: 20.0000 mg | ORAL_TABLET | Freq: Every day | ORAL | Status: DC

## 2014-12-19 MED ORDER — POTASSIUM CHLORIDE CRYS ER 20 MEQ PO TBCR: 20.0000 meq | EXTENDED_RELEASE_TABLET | Freq: Every day | ORAL | Status: DC

## 2014-12-19 MED ORDER — LEVOTHYROXINE SODIUM 75 MCG PO TABS: 75.0000 ug | ORAL_TABLET | Freq: Every day | ORAL | Status: DC

## 2014-12-19 MED ORDER — VITAMIN D (ERGOCALCIFEROL) 1.25 MG (50000 UNIT) PO CAPS: 50000.0000 [IU] | ORAL_CAPSULE | ORAL | Status: DC

## 2014-12-23 NOTE — Telephone Encounter (Signed)
Julie Ross, or Julie Ross,  Please refer patient to Dr. Marcello Moores for severe constipation / redundant colon. Also patient needs an anorectal manometry scheduled. I tried a bowel purge on her previously but let's try a Miralax purge if patient is willing. Thanks

## 2014-12-25 ENCOUNTER — Other Ambulatory Visit: Payer: Self-pay

## 2014-12-25 DIAGNOSIS — Q438 Other specified congenital malformations of intestine: Secondary | ICD-10-CM

## 2014-12-25 DIAGNOSIS — K5900 Constipation, unspecified: Secondary | ICD-10-CM

## 2014-12-25 NOTE — Telephone Encounter (Signed)
Patient contacted. Agrees with this plan. She will have the manometry on 02/09/15 at 10:30 am. Information given verbally and sent in a letter to her. Message left for the referral coordinator at [place] requesting an appointment.

## 2014-12-26 NOTE — Telephone Encounter (Signed)
She can cancel with me and lets see what Dr. Marcello Moores says

## 2014-12-26 NOTE — Telephone Encounter (Signed)
Appointment are scheduled. She will see Dr Marcello Moores on 01/16/15. Manometry on 02/09/15. Does she need to keep the appointment with Dr Carlean Purl on 01/12/15?

## 2014-12-29 NOTE — Telephone Encounter (Signed)
Sent to me in error. 

## 2015-01-01 NOTE — Telephone Encounter (Signed)
Sheri, can you ask for a copy of consult note from surgery. I would like to know what they thought about this patient.

## 2015-01-01 NOTE — Telephone Encounter (Signed)
Patient will not see Dr. Marcello Moores until 01/16/15

## 2015-01-12 ENCOUNTER — Ambulatory Visit: Payer: 59 | Admitting: Internal Medicine

## 2015-01-16 ENCOUNTER — Other Ambulatory Visit: Payer: 59

## 2015-01-19 ENCOUNTER — Telehealth: Payer: Self-pay | Admitting: Internal Medicine

## 2015-01-19 NOTE — Telephone Encounter (Signed)
Patient did not care for Dr. Marcello Moores. She wants to know what the next step is.  She is advised that she should proceed with manometry for 02/09/15.  I asked that if Dr. Carlean Purl does want to refer her back to a surgeon and she does to want to return to [facility]. She should look for another Psychologist, sport and exercise at [facility], [place], or [facility].

## 2015-01-20 ENCOUNTER — Other Ambulatory Visit: Payer: Self-pay | Admitting: Family Medicine

## 2015-01-20 NOTE — Telephone Encounter (Signed)
Med filled.  

## 2015-01-22 ENCOUNTER — Encounter: Payer: Self-pay | Admitting: Family Medicine

## 2015-01-22 ENCOUNTER — Ambulatory Visit (INDEPENDENT_AMBULATORY_CARE_PROVIDER_SITE_OTHER): Payer: Commercial Managed Care - PPO | Admitting: Family Medicine

## 2015-01-22 VITALS — BP 126/82 | HR 59 | Temp 98.0°F | Resp 16 | Wt 208.1 lb

## 2015-01-22 DIAGNOSIS — E785 Hyperlipidemia, unspecified: Secondary | ICD-10-CM | POA: Diagnosis not present

## 2015-01-22 DIAGNOSIS — K5909 Other constipation: Secondary | ICD-10-CM | POA: Diagnosis not present

## 2015-01-22 DIAGNOSIS — E876 Hypokalemia: Secondary | ICD-10-CM | POA: Diagnosis not present

## 2015-01-22 DIAGNOSIS — E038 Other specified hypothyroidism: Secondary | ICD-10-CM | POA: Diagnosis not present

## 2015-01-22 DIAGNOSIS — T50905A Adverse effect of unspecified drugs, medicaments and biological substances, initial encounter: Secondary | ICD-10-CM | POA: Diagnosis not present

## 2015-01-22 DIAGNOSIS — K5903 Drug induced constipation: Secondary | ICD-10-CM

## 2015-01-22 MED ORDER — LINACLOTIDE 145 MCG PO CAPS: 145.0000 ug | ORAL_CAPSULE | Freq: Every day | ORAL | Status: DC

## 2015-01-22 MED ORDER — ONDANSETRON HCL 4 MG PO TABS: 4.0000 mg | ORAL_TABLET | Freq: Three times a day (TID) | ORAL | Status: DC | PRN

## 2015-01-22 NOTE — Progress Notes (Signed)
Pre visit review using our clinic review tool, if applicable. No additional management support is needed unless otherwise documented below in the visit note. 

## 2015-01-22 NOTE — Progress Notes (Signed)
   Subjective:    Patient ID: Julie Ross, female    DOB: 08-02-1960, 54 y.o.   MRN: [id number]  HPI Constipation- was supposed to have GI appt on 7/25 w/ Dr Carlean Purl and that was cancelled.  Pt has manometry scheduled for 8/22.  Had appt w/ Dr Marcello Moores at [facility] and 'they talked about putting a colostomy bag in me for 3 months'.  Pt is not interested in surgery at this time and felt that surgeon was not understanding her reasons.  Pt is 'very confused and frustrated' bc she has yet to see a GI doctor.  Pt reports ongoing nausea, abd cramps.  Pt is on Lactulose, Colace, Linzess.  Has Miralax but reports this is not effective.  Pt has appt scheduled [facility]/ [facility] on Aug 15th.  Pt reports watery stools despite chronic constipation.   Review of Systems For ROS see HPI     Objective:   Physical Exam  Constitutional: She is oriented to person, place, and time. She appears well-developed and well-nourished. No distress.  HENT:  Head: Normocephalic and atraumatic.  MMM  Neck: Neck supple.  Cardiovascular: Normal rate, regular rhythm and intact distal pulses.   Pulmonary/Chest: Effort normal and breath sounds normal. No respiratory distress. She has no wheezes. She has no rales.  Abdominal: Soft. She exhibits no distension. There is no tenderness. There is no rebound.  Hyperactive BS  Lymphadenopathy:    She has no cervical adenopathy.  Neurological: She is alert and oriented to person, place, and time.  Skin: Skin is warm and dry.  Psychiatric:  Very frustrated  Vitals reviewed.         Assessment & Plan:

## 2015-01-22 NOTE — Assessment & Plan Note (Signed)
Ongoing issue for pt.  She is very upset by sequence of events- thought she should have seen GI prior to being referred to surgery, thought she would see GI prior to having manometry.  Called and scheduled appt w/ [facility] on 8/15.  Will decrease Linzess to 1/2 tab based on reports of watery, crampy stools.  Pt has already decreased her lactulose to once every other day.  Provided refill on Zofran for pt.  Will continue to follow.

## 2015-01-22 NOTE — Patient Instructions (Signed)
Follow up as scheduled We'll notify you of your lab results and make any changes if needed Continue the Lactulose every other day Decrease the Linzess to 145mg  daily- new prescription sent Drink plenty of fluids Call with any questions or concerns GOOD LUCK [facility]!!!

## 2015-01-23 ENCOUNTER — Other Ambulatory Visit (INDEPENDENT_AMBULATORY_CARE_PROVIDER_SITE_OTHER): Payer: Commercial Managed Care - PPO

## 2015-01-23 DIAGNOSIS — R748 Abnormal levels of other serum enzymes: Secondary | ICD-10-CM

## 2015-01-23 LAB — HEPATIC FUNCTION PANEL
ALT: 16 U/L (ref 0–35)
AST: 18 U/L (ref 0–37)
Albumin: 4.5 g/dL (ref 3.5–5.2)
Alkaline Phosphatase: 162 U/L — ABNORMAL HIGH (ref 39–117)
Bilirubin, Direct: 0.1 mg/dL (ref 0.0–0.3)
Total Bilirubin: 0.7 mg/dL (ref 0.2–1.2)
Total Protein: 7.4 g/dL (ref 6.0–8.3)

## 2015-01-23 LAB — BASIC METABOLIC PANEL
BUN: 22 mg/dL (ref 6–23)
CO2: 27 mEq/L (ref 19–32)
Calcium: 9.7 mg/dL (ref 8.4–10.5)
Chloride: 99 mEq/L (ref 96–112)
Creatinine, Ser: 1.11 mg/dL (ref 0.40–1.20)
GFR: 53.96 mL/min — ABNORMAL LOW (ref >60.00)
Glucose, Bld: 128 mg/dL — ABNORMAL HIGH (ref 70–99)
Potassium: 3.5 mEq/L (ref 3.5–5.1)
Sodium: 136 mEq/L (ref 135–145)

## 2015-01-23 LAB — GAMMA GT: GGT: 64 U/L — ABNORMAL HIGH (ref 7–51)

## 2015-01-23 LAB — TSH: TSH: 0.95 u[IU]/mL (ref 0.35–4.50)

## 2015-01-26 ENCOUNTER — Other Ambulatory Visit: Payer: Self-pay | Admitting: Family Medicine

## 2015-01-26 DIAGNOSIS — R748 Abnormal levels of other serum enzymes: Secondary | ICD-10-CM

## 2015-01-27 ENCOUNTER — Ambulatory Visit (HOSPITAL_BASED_OUTPATIENT_CLINIC_OR_DEPARTMENT_OTHER)
Admission: RE | Admit: 2015-01-27 | Discharge: 2015-01-27 | Disposition: A | Discharge status: Home / Self Care | Payer: Commercial Managed Care - PPO | Source: Ambulatory Visit | Attending: Family Medicine | Admitting: Family Medicine

## 2015-01-27 DIAGNOSIS — R7989 Other specified abnormal findings of blood chemistry: Secondary | ICD-10-CM | POA: Insufficient documentation

## 2015-01-27 DIAGNOSIS — R748 Abnormal levels of other serum enzymes: Secondary | ICD-10-CM

## 2015-02-02 ENCOUNTER — Ambulatory Visit (HOSPITAL_COMMUNITY): Admit: 2015-02-02 | Payer: Self-pay | Admitting: Gastroenterology

## 2015-02-02 ENCOUNTER — Encounter (HOSPITAL_COMMUNITY): Payer: Self-pay

## 2015-02-02 DIAGNOSIS — Z0000 Encounter for general adult medical examination without abnormal findings: Secondary | ICD-10-CM | POA: Insufficient documentation

## 2015-02-02 DIAGNOSIS — R630 Anorexia: Secondary | ICD-10-CM | POA: Insufficient documentation

## 2015-02-02 DIAGNOSIS — K219 Gastro-esophageal reflux disease without esophagitis: Secondary | ICD-10-CM | POA: Insufficient documentation

## 2015-02-02 DIAGNOSIS — R1084 Generalized abdominal pain: Secondary | ICD-10-CM | POA: Insufficient documentation

## 2015-02-02 SURGERY — MANOMETRY, ANORECTAL
Anesthesia: Choice

## 2015-02-03 ENCOUNTER — Telehealth: Payer: Self-pay | Admitting: Internal Medicine

## 2015-02-04 NOTE — Telephone Encounter (Signed)
Patient wants to cancel and not reschedule.  I have cancelled this with Sharee Pimple at [facility].

## 2015-02-09 ENCOUNTER — Ambulatory Visit (HOSPITAL_COMMUNITY)
Admission: RE | Admit: 2015-02-09 | Payer: Commercial Managed Care - PPO | Source: Ambulatory Visit | Admitting: Internal Medicine

## 2015-02-09 ENCOUNTER — Encounter (HOSPITAL_COMMUNITY): Admission: RE | Payer: Self-pay | Source: Ambulatory Visit

## 2015-02-09 SURGERY — MANOMETRY, ANORECTAL
Anesthesia: Choice

## 2015-02-18 DIAGNOSIS — M25569 Pain in unspecified knee: Secondary | ICD-10-CM | POA: Insufficient documentation

## 2015-02-18 DIAGNOSIS — I10 Essential (primary) hypertension: Secondary | ICD-10-CM | POA: Insufficient documentation

## 2015-02-18 DIAGNOSIS — G35 Multiple sclerosis: Secondary | ICD-10-CM | POA: Insufficient documentation

## 2015-02-18 DIAGNOSIS — G4733 Obstructive sleep apnea (adult) (pediatric): Secondary | ICD-10-CM | POA: Insufficient documentation

## 2015-02-18 DIAGNOSIS — E039 Hypothyroidism, unspecified: Secondary | ICD-10-CM | POA: Insufficient documentation

## 2015-02-18 DIAGNOSIS — R110 Nausea: Secondary | ICD-10-CM | POA: Insufficient documentation

## 2015-02-18 DIAGNOSIS — Z853 Personal history of malignant neoplasm of breast: Secondary | ICD-10-CM | POA: Insufficient documentation

## 2015-02-19 ENCOUNTER — Ambulatory Visit: Payer: 59 | Admitting: Neurology

## 2015-02-24 ENCOUNTER — Ambulatory Visit (INDEPENDENT_AMBULATORY_CARE_PROVIDER_SITE_OTHER): Payer: Commercial Managed Care - PPO | Admitting: Neurology

## 2015-02-24 ENCOUNTER — Encounter: Payer: Self-pay | Admitting: Neurology

## 2015-02-24 VITALS — BP 130/86 | HR 74 | Resp 14 | Ht 66.5 in | Wt 211.0 lb

## 2015-02-24 DIAGNOSIS — C50411 Malignant neoplasm of upper-outer quadrant of right female breast: Secondary | ICD-10-CM | POA: Diagnosis not present

## 2015-02-24 DIAGNOSIS — F411 Generalized anxiety disorder: Secondary | ICD-10-CM | POA: Diagnosis not present

## 2015-02-24 DIAGNOSIS — G2581 Restless legs syndrome: Secondary | ICD-10-CM | POA: Diagnosis not present

## 2015-02-24 DIAGNOSIS — R5383 Other fatigue: Secondary | ICD-10-CM

## 2015-02-24 DIAGNOSIS — G35 Multiple sclerosis: Secondary | ICD-10-CM

## 2015-02-24 DIAGNOSIS — G4733 Obstructive sleep apnea (adult) (pediatric): Secondary | ICD-10-CM | POA: Diagnosis not present

## 2015-02-24 DIAGNOSIS — K5909 Other constipation: Secondary | ICD-10-CM

## 2015-02-24 DIAGNOSIS — F329 Major depressive disorder, single episode, unspecified: Secondary | ICD-10-CM

## 2015-02-24 DIAGNOSIS — F32A Depression, unspecified: Secondary | ICD-10-CM

## 2015-02-24 MED ORDER — ONDANSETRON HCL 4 MG PO TABS: ORAL_TABLET | ORAL | Status: DC

## 2015-02-24 MED ORDER — ALPRAZOLAM 0.5 MG PO TABS: 0.5000 mg | ORAL_TABLET | Freq: Three times a day (TID) | ORAL | Status: DC | PRN

## 2015-02-24 NOTE — Progress Notes (Signed)
[facility]  PATIENT: Julie Ross DOB: 1960/09/04  REFERRING DOCTOR OR PCP:  Annye Asa SOURCE: patient and records from [facility]  _________________________________   HISTORICAL  CHIEF COMPLAINT:  Chief Complaint  Patient presents with  . Multiple Sclerosis    Sts. she has not started Aubagio due to continued ortho problems with her left knee.  Sts. depression is some worse due to health problems.  Sts. she has ongoing severe consitpation (seeing gi at [facility] for same) and is scheduled for a colonoscopy. She sts. she is also under more stress due to family issues/fim    HISTORY OF PRESENT ILLNESS:  Julie Ross is a 54 year old woman with multiple sclerosis.   She is currently off a disease modifying therapy. Aubagio was prescribed but she decided not to take it and reports her orthopedic surgeon refers her to not be on any immunomodulatory agent at this time due to her septic joint in the past. She will most likely have another operation on the left knee.  Gait/strength/sensation:   Gait issues are more related to left knee than to MS.   Her legs fatigue easily but not actually weak.   Gait is balanced.  She occasioanally trips over her feet but has not fallen.  She denies any leg numbness.  Vision:   She had left optic neuritis in 2012. Visual acuity improved but not to baseline.   Colors are mildly desaturated  Bladder/bowel:   Bladder function is fine.  She has had a lot of constipation -- currently on Amitiza.  Linzess caused intermittent diarrhea.       Fatigue/sleep:   She notes a lot of fatigue, both physical and mental.    She does worse when hot or when she had any infection.    She has severe obstructive sleep apnea (AHI=33) that was diagnosed on 11/20/2013.  She was placed on Auto-PAP but has had difficulty tolerating it.  She has used the PAP very intermittently.    She has woken up gasping some nights.    She also has moderate RLS  with some impact on sleep.   She has excessive daytime sleepiness and fatigue during the day but has insomnia at night.   She is on gabapentin 300 mg .   She also takes xanax at bedtime.     Mood/cognition:   She reports some anxiety but no significant depression.    She notes mild cognitive issues with verbal fluency, memory and attention deficit.      She has had multiple infections in the left knee during chemotherapy.   She has had bilateral TKR.   Due to infections and hardware infection, she has had 4 operations on the left.    Her orthopedics prefers not to be on any medication that might affect the immune system being.s  MS History:    She was diagnosed with MS in 1999 after presenting with optic neuritis.   She had a spinal tap, MRI and angiogram.   Initially, the diagnosis was uncertain but she had changes in the MRI over time leading to a diagnosis of MS a few years later.    Initially, she was placed on Copaxone, then switched to Avonex due to skin reactions.   She then switched to Tysabri when she had some mild breakthrough disease. She tolerated Tysabri very well and her MS did well.  However, she was JCV antibody positive so she stopped in 2012.   She did not restart  Avonex as she did not want to go back to an injection.    Her last exacerbation was optic neuritis in 2012 treated with steroids. She proved but did not get back to baseline.  This exacerbation occurred off any DMT.  She tried Gilenya in 2013. She had trouble tolerating Gilenya and discontinued shortly after starting it. She was going to start Tecfidera but was diagnosed with breast cancer in 2013 and opted not to start at that time. She received chemotherapy including Taxotere, carboplatin, Herceptin in 2013 and 2014. An MRI of the brain in 2015 showed only mild MS progression compared to her previous one in 2011.   Aubagio was recommended but this is being held as orthopedics has told her that they prefer her not to be on any  immunomodulatory therapy.   REVIEW OF SYSTEMS: Constitutional: No fevers, chills, sweats, or change in appetite.  Notes fatigue Eyes: No visual changes, double vision, eye pain Ear, nose and throat: No hearing loss, ear pain, nasal congestion, sore throat.  Recent URI Cardiovascular: No chest pain, palpitations Respiratory: No shortness of breath at rest or with exertion.   No wheezes.  Has OSA GastrointestinaI: No nausea, vomiting, diarrhea, abdominal pain, fecal incontinence Genitourinary: No dysuria, urinary retention or frequency.  She has 1 times nocturia most nights Musculoskeletal: No neck pain, back pain.  She has bilateral knee pain Integumentary: No rash, pruritus, skin lesions Neurological: as above Psychiatric: No depression at this time.  Some anxiety Endocrine: No palpitations, diaphoresis, change in appetite, change in weigh or increased thirst Hematologic/Lymphatic: No anemia, purpura, petechiae. Allergic/Immunologic: No itchy/runny eyes, nasal congestion, recent allergic reactions, rashes  ALLERGIES: No Known Allergies  HOME MEDICATIONS:  Current outpatient prescriptions:  .  ALPRAZolam (XANAX) 0.5 MG tablet, Take 1 tablet (0.5 mg total) by mouth 2 (two) times daily as needed for anxiety., Disp: 60 tablet, Rfl: 5 .  AMITIZA 24 MCG capsule, , Disp: , Rfl: 2 .  amLODipine (NORVASC) 10 MG tablet, TAKE 1 TABLET BY MOUTH DAILY, Disp: 90 tablet, Rfl: 1 .  atenolol (TENORMIN) 25 MG tablet, TAKE 1/2 TABLET BY MOUTH DAILY BEFORE BREAKFAST. FOR BLOOD PRESSURE, Disp: 30 tablet, Rfl: 3 .  atorvastatin (LIPITOR) 20 MG tablet, Take 1 tablet (20 mg total) by mouth daily., Disp: 30 tablet, Rfl: 6 .  baclofen (LIORESAL) 10 MG tablet, Take 1 tablet (10 mg total) by mouth 3 (three) times daily., Disp: 90 each, Rfl: 11 .  buPROPion (WELLBUTRIN XL) 150 MG 24 hr tablet, TAKE 1 TABLET BY MOUTH EVERY DAY, Disp: 30 tablet, Rfl: 6 .  CALCIUM-MAGNESIUM PO, Take 1 tablet by mouth daily.,  Disp: , Rfl:  .  docusate sodium (COLACE) 50 MG capsule, Take 100 mg by mouth at bedtime., Disp: , Rfl:  .  EPINEPHrine 0.3 mg/0.3 mL IJ SOAJ injection, Inject 0.3 mLs (0.3 mg total) into the muscle once., Disp: 2 Device, Rfl: 2 .  gabapentin (NEURONTIN) 300 MG capsule, Take 300 mg by mouth 2 (two) times daily. , Disp: , Rfl:  .  GAVILYTE-G 236 G solution, MIX AND DRINK UTD, Disp: , Rfl: 0 .  GENERLAC 10 GM/15ML SOLN, TK 30 MLS PO BID, Disp: , Rfl: 0 .  ibuprofen (ADVIL,MOTRIN) 600 MG tablet, , Disp: , Rfl:  .  letrozole (FEMARA) 2.5 MG tablet, TAKE 1 TABLET BY MOUTH DAILY, Disp: 30 tablet, Rfl: 3 .  levothyroxine (SYNTHROID, LEVOTHROID) 75 MCG tablet, Take 1 tablet (75 mcg total) by mouth daily., Disp: 30  tablet, Rfl: 6 .  losartan-hydrochlorothiazide (HYZAAR) 100-25 MG per tablet, TAKE 1 TABLET BY MOUTH EVERY MORNING BEFORE BREAKFAST, Disp: 90 tablet, Rfl: 1 .  Multiple Vitamins-Minerals (MULTIVITAMINS THER. W/MINERALS) TABS, Take 1 tablet by mouth daily., Disp: , Rfl:  .  omeprazole (PRILOSEC) 40 MG capsule, Take 1 capsule (40 mg total) by mouth daily., Disp: 30 capsule, Rfl: 6 .  ondansetron (ZOFRAN) 4 MG tablet, Take 1 tablet (4 mg total) by mouth every 8 (eight) hours as needed for nausea or vomiting (For 72 hours then as needed)., Disp: 45 tablet, Rfl: 1 .  potassium chloride SA (K-DUR,KLOR-CON) 20 MEQ tablet, Take 1 tablet (20 mEq total) by mouth daily., Disp: 30 tablet, Rfl: 6 .  Probiotic Product (PROBIOTIC DAILY PO), Take 1 capsule by mouth daily. , Disp: , Rfl:  .  venlafaxine XR (EFFEXOR-XR) 150 MG 24 hr capsule, Take 1 capsule (150 mg total) by mouth daily with breakfast., Disp: 30 capsule, Rfl: 6 .  Vitamin D, Ergocalciferol, (DRISDOL) 50000 UNITS CAPS capsule, Take 1 capsule (50,000 Units total) by mouth every 7 (seven) days., Disp: 4 capsule, Rfl: 2 .  vitamin E 400 UNIT capsule, Take 800 Units by mouth daily., Disp: , Rfl:  .  HYDROcodone-acetaminophen (NORCO/VICODIN) 5-325 MG per  tablet, Take 1 tablet by mouth daily as needed for moderate pain or severe pain. , Disp: , Rfl:  .  lactulose (CHRONULAC) 10 GM/15ML solution, Take 30 mLs (20 g total) by mouth 2 (two) times daily. (Patient not taking: Reported on 02/24/2015), Disp: 1892 mL, Rfl: 0 .  Linaclotide (LINZESS) 145 MCG CAPS capsule, Take 1 capsule (145 mcg total) by mouth daily. (Patient not taking: Reported on 02/24/2015), Disp: 30 capsule, Rfl: 6 .  polyethylene glycol powder (MIRALAX) powder, Take 1 Container by mouth daily., Disp: , Rfl:  No current facility-administered medications for this visit.  Facility-Administered Medications Ordered in Other Visits:  .  ceFAZolin (ANCEF) IVPB 2 g/50 mL premix, , , PRN, Tawni Millers, CRNA, 2 g at 04/19/13 0956 .  dexamethasone (DECADRON) injection, , , PRN, Tawni Millers, CRNA, 8 mg at 05/01/12 0908 .  fentaNYL (SUBLIMAZE) injection, , , PRN, Tawni Millers, CRNA, 100 mcg at 05/01/12 0856 .  lidocaine (cardiac) 100 mg/84ml (XYLOCAINE) 20 MG/ML injection 2%, , , PRN, Tawni Millers, CRNA, 60 mg at 05/01/12 0856 .  midazolam (VERSED) 5 MG/5ML injection, , , PRN, Tawni Millers, CRNA, 2 mg at 05/01/12 0856 .  propofol (DIPRIVAN) 10 mg/mL bolus/IV push, , , PRN, Tawni Millers, CRNA, 160 mg at 05/01/12 0900  PAST MEDICAL HISTORY: Past Medical History  Diagnosis Date  . Hypertension   . Hypothyroidism   . Neuromuscular disorder     MS TX WITH CYMBALTA   . Multiple sclerosis   . H/O colonoscopy   . H/O bone density study 03/2011  . Wears glasses   . Heart burn   . Depression   . Hx of radiation therapy 04/17/12- 06/04/12    left chest wall, axilla, L supraclavicular fossa 4500 cGy, left mastectomy scar/chest wall 5940 cGy  . H/O echocardiogram     last one 02/2013, due to chemotherapy  . Sleep apnea     MILD SLEEP APNEA, NO (Needed) MACHINE  6 YRS AGO [facility]   . GERD (gastroesophageal reflux disease)   . Breast cancer 11/2011    lul/ER+PR+, x1 lymph node   .  Cancer     squamous cell skin cancer x7  . Arthritis  knees   . Anemia     h/o - ? bld. transfusion - more recent- ?2013  . Anxiety     uses xanax mainly for sleep    PAST SURGICAL HISTORY: Past Surgical History  Procedure Laterality Date  . Tonsillectomy    . Tumor removal      FROM PELVIS AGE 58  . Cesarean section      X2   . No past surgeries      BLADDER SLING  4-5 YRS AGO   . Knee arthroscopy      LT KNEE 04/2011  . Total knee arthroplasty  08/15/2011    Procedure: TOTAL KNEE ARTHROPLASTY; lft Surgeon: Alta Corning, MD;  Location: [place];  Service: Orthopedics;  Laterality: Left;  RIGHT KNEE CORTIZONE INJECTION  . Mohs surgery      right face  . Knee arthroscopy  82    rt  . Mastectomy w/ sentinel node biopsy  12/19/2011    Procedure: MASTECTOMY WITH SENTINEL LYMPH NODE BIOPSY;  Surgeon: Haywood Lasso, MD;  Location: [place];  Service: General;  Laterality: Left;  left breast and left axilla   . Portacath placement  01/16/2012    Procedure: INSERTION PORT-A-CATH;  Surgeon: Haywood Lasso, MD;  Location: [place];  Service: General;  Laterality: Right;  [place] Cath Placement   . I&d knee with poly exchange  03/02/2012    Procedure: IRRIGATION AND DEBRIDEMENT KNEE WITH POLY EXCHANGE;  Surgeon: Alta Corning, MD;  Location: [place];  Service: Orthopedics;  Laterality: Left;  I&D of total knee with Possible Poly Exchange   . Tee without cardioversion  03/06/2012    Procedure: TRANSESOPHAGEAL ECHOCARDIOGRAM (TEE);  Surgeon: Josue Hector, MD;  Location: [place];  Service: Cardiovascular;  Laterality: N/A;  Rm. 2927  . Port-a-cath removal  03/06/2012    Procedure: REMOVAL PORT-A-CATH;  Surgeon: Odis Hollingshead, MD;  Location: [place];  Service: General;  Laterality: N/A;  . Portacath placement  05/01/2012    Procedure: INSERTION PORT-A-CATH;  Surgeon: Haywood Lasso, MD;  Location: [place];  Service: General;  Laterality: Right;   right internal jugular port-a-cath insertion  . Total knee arthroplasty Left 08/18/2012    Procedure:  Irrigation and debridement of LEFT total knee;  Removal of total knee parts; Implant of spacers;  Surgeon: Kerin Salen, MD;  Location: [place];  Service: Orthopedics;  Laterality: Left;  . Port-a-cath removal Right 08/22/2012    Procedure: REMOVAL PORT-A-CATH;  Surgeon: Edward Jolly, MD;  Location: [place];  Service: General;  Laterality: Right;  . Joint replacement  Feb 2013    left knee- multiple surgeries   . Mastectomy    . Total knee revision Left 04/19/2013    Procedure: TOTAL KNEE REVISION AND REMOVAL CEMENT SPACER;  Surgeon: Kerin Salen, MD;  Location: [place];  Service: Orthopedics;  Laterality: Left;  . Total knee arthroplasty Right 08/12/2013  . Total knee arthroplasty Right 08/12/2013    Procedure: RIGHT TOTAL KNEE ARTHROPLASTY;  Surgeon: Kerin Salen, MD;  Location: [place];  Service: Orthopedics;  Laterality: Right;  . Esophagogastroduodenoscopy    . Colonoscopy    . Flexible sigmoidoscopy      FAMILY HISTORY: Family History  Problem Relation Age of Onset  . Breast cancer Paternal Aunt     dx in her 64s  . Heart disease Maternal Grandfather   . Lung cancer Maternal Uncle  smoker  . Breast cancer Paternal Grandmother     dx in her 52s  . Ovarian cancer Paternal Aunt     dx in her 29s  . Arthritis Mother   . Hypertension Mother   . Heart disease Mother     CHF  . Dementia Father   . Diabetes type II Father     SOCIAL HISTORY:  Social History   Social History  . Marital Status: Married    Spouse Name: N/A  . Number of Children: 2  . Years of Education: N/A   Occupational History  .     Social History Main Topics  . Smoking status: Never Smoker   . Smokeless tobacco: Never Used  . Alcohol Use: No  . Drug Use: No  . Sexual Activity: Yes   Other Topics Concern  . Not on file   Social History Narrative     PHYSICAL EXAM  Filed Vitals:    02/24/15 1451  BP: 130/86  Pulse: 74  Resp: 14  Height: 5' 6.5" (1.689 m)  Weight: 211 lb (95.709 kg)    Body mass index is 33.55 kg/(m^2).   General: The patient is well-developed and well-nourished and in no acute distress  Eyes:  Funduscopic exam shows normal optic discs and retinal vessels.  Neck: The neck is supple, no carotid bruits are noted.  The neck is nontender.  Cardiovascular: The heart has a regular rate and rhythm with a normal S1 and S2. There were no murmurs, gallops or rubs. Lungs are clear to auscultation.  Skin: Extremities are without significant edema.  Musculoskeletal:  Back is nontender.   Bilateral TKR scars.   Left knee mildly tender  Neurologic Exam  Mental status: The patient is alert and oriented x 3 at the time of the examination. The patient has apparent normal recent and remote memory, with an apparently normal attention span and concentration ability.   Speech is normal.  Cranial nerves: Extraocular movements are full. Pupils are equal, round, and reactive to light and accomodation.  Visual fields are full.  Facial symmetry is present. There is good facial sensation to soft touch bilaterally.Facial strength is normal.  Trapezius and sternocleidomastoid strength is normal. No dysarthria is noted.  The tongue is midline, and the patient has symmetric elevation of the soft palate. No obvious hearing deficits are noted.  Motor:  Muscle bulk is normal.   Tone is normal. Strength is  5 / 5 in all 4 extremities.   Sensory: Sensory testing is intact to pinprick, soft touch and vibration sensation in all 4 extremities.  Coordination: Cerebellar testing reveals good finger-nose-finger and heel-to-shin bilaterally.  Gait and station: Station is normal.   Gait is artthritic. Tandem gait is wide. Romberg is negative.   Reflexes: Deep tendon reflexes are symmetric and normal bilaterally (skipped tender knees).   Plantar responses are flexor.    DIAGNOSTIC  DATA (LABS, IMAGING, TESTING) - I reviewed patient records, labs, notes, testing and imaging myself where available.  Lab Results  Component Value Date   WBC 8.5 12/17/2014   HGB 13.5 12/17/2014   HCT 40.2 12/17/2014   MCV 84.6 12/17/2014   PLT 274.0 12/17/2014      Component Value Date/Time   NA 136 01/22/2015 1531   NA 138 08/18/2014 1551   K 3.5 01/22/2015 1531   K 3.7 08/18/2014 1551   CL 99 01/22/2015 1531   CL 100 11/30/2012 1029   CO2 27 01/22/2015 1531  CO2 27 08/18/2014 1551   GLUCOSE 128* 01/22/2015 1531   GLUCOSE 117 08/18/2014 1551   GLUCOSE 91 11/30/2012 1029   BUN 22 01/22/2015 1531   BUN 17.2 08/18/2014 1551   CREATININE 1.11 01/22/2015 1531   CREATININE 1.3* 08/18/2014 1551   CALCIUM 9.7 01/22/2015 1531   CALCIUM 9.1 08/18/2014 1551   PROT 7.4 01/22/2015 1531   PROT 6.8 10/15/2014 1420   PROT 7.1 08/18/2014 1551   ALBUMIN 4.5 01/22/2015 1531   ALBUMIN 4.0 08/18/2014 1551   AST 18 01/22/2015 1531   AST 19 08/18/2014 1551   ALT 16 01/22/2015 1531   ALT 21 08/18/2014 1551   ALKPHOS 162* 01/22/2015 1531   ALKPHOS 137 08/18/2014 1551   BILITOT 0.7 01/22/2015 1531   BILITOT 0.6 10/15/2014 1420   BILITOT 0.46 08/18/2014 1551   GFRNONAA 47* 12/09/2014 1110   GFRAA 55* 12/09/2014 1110       ASSESSMENT AND PLAN  Multiple sclerosis  Depression due to multiple sclerosis  Obstructive sleep apnea  Other constipation  Restless leg syndrome  Other fatigue  Breast cancer of upper-outer quadrant of right female breast  Anxiety state    1.   Her orthopedic surgeon would like her to stay off on any immunomodulatory or immunosuppressive therapy at this time since her MS appears to be stable we will hold a disease modifying therapy. I do recommend that she start Aubagio if there is any relapse and I would want to recheck an MRI next year to make sure that there is no subclinical progression of disease.. Copaxone would probably be the safest medication  but she had difficulty tolerating it in the past. 2.   She is having more difficulty with anxiety and this causes some insomnia at times. I will increase her alprazolam to 0.5 mg by mouth when necessary and 1 mg at bedtime.  She is advised to retry CPAP as it will likely help her fatigue and may help the quality of sleep.  3.   She will return to see me in 5-6 months or sooner if she has new or worsening neurologic symptoms.  Richard A. Felecia Shelling, MD, PhD 01/21/6961, 9:52 PM Certified in Neurology, Clinical Neurophysiology, Sleep Medicine, Pain Medicine and Neuroimaging  [facility] [address] [place], [place] [address] [phone]

## 2015-02-26 ENCOUNTER — Other Ambulatory Visit: Payer: 59

## 2015-02-26 ENCOUNTER — Ambulatory Visit: Payer: 59 | Admitting: Hematology and Oncology

## 2015-03-02 ENCOUNTER — Other Ambulatory Visit: Payer: Self-pay | Admitting: Obstetrics and Gynecology

## 2015-03-03 LAB — CYTOLOGY - PAP

## 2015-03-09 ENCOUNTER — Other Ambulatory Visit: Payer: Commercial Managed Care - PPO

## 2015-03-09 ENCOUNTER — Ambulatory Visit: Payer: Self-pay | Admitting: Hematology and Oncology

## 2015-03-09 ENCOUNTER — Other Ambulatory Visit: Payer: Self-pay

## 2015-03-09 ENCOUNTER — Telehealth: Payer: Self-pay | Admitting: Hematology and Oncology

## 2015-03-09 NOTE — Telephone Encounter (Signed)
Patient called in and left a message to cancel her appointment today and will call back to reschedule

## 2015-03-09 NOTE — Assessment & Plan Note (Signed)
Left breast invasive ductal carcinoma with DCIS ER/PR positive HER-2 positive: Status post mastectomy, adjuvant chemotherapy in 1 year of Herceptin. Currently on letrozole adjuvant hormonal therapy started 03/13/2013.   Letrozole toxicities: 1. No major hot flashes or myalgias.  Knee infection: patient has had multiple problems with her Left knee  she continues to have some achiness and pain in the knee.  Breast cancer surveillance: 1. Breast exam done 03/09/2015 is normal 2. Mammogram 02/06/2014 is normal  Multiple sclerosis: Causing fatigue and body pains. Patient was offered a treatment for MS but she does not want to take it because one of the Adverse effects was neutropenia. Because of her knee infection , she does not want to take any medication that would lower her white blood cell count.  Anxiety/depression: On Wellbutrin and Cymbalta. Hypokalemia

## 2015-03-10 ENCOUNTER — Other Ambulatory Visit: Payer: Self-pay | Admitting: Family Medicine

## 2015-03-11 NOTE — Telephone Encounter (Signed)
Medication filled to pharmacy as requested.   

## 2015-03-17 ENCOUNTER — Ambulatory Visit: Payer: 59 | Admitting: Internal Medicine

## 2015-04-10 ENCOUNTER — Other Ambulatory Visit: Payer: Self-pay | Admitting: General Practice

## 2015-04-10 MED ORDER — BUPROPION HCL ER (XL) 150 MG PO TB24: 150.0000 mg | ORAL_TABLET | Freq: Every day | ORAL | Status: DC

## 2015-04-10 MED ORDER — LEVOTHYROXINE SODIUM 75 MCG PO TABS: 75.0000 ug | ORAL_TABLET | Freq: Every day | ORAL | Status: DC

## 2015-04-10 MED ORDER — ATORVASTATIN CALCIUM 20 MG PO TABS: 20.0000 mg | ORAL_TABLET | Freq: Every day | ORAL | Status: DC

## 2015-04-10 MED ORDER — ATENOLOL 25 MG PO TABS: ORAL_TABLET | ORAL | Status: DC

## 2015-04-10 MED ORDER — AMLODIPINE BESYLATE 10 MG PO TABS: 10.0000 mg | ORAL_TABLET | Freq: Every day | ORAL | Status: DC

## 2015-04-10 MED ORDER — OMEPRAZOLE 40 MG PO CPDR: DELAYED_RELEASE_CAPSULE | ORAL | Status: DC

## 2015-04-10 MED ORDER — VENLAFAXINE HCL ER 150 MG PO CP24: 150.0000 mg | ORAL_CAPSULE | Freq: Every day | ORAL | Status: DC

## 2015-04-10 MED ORDER — LOSARTAN POTASSIUM-HCTZ 100-25 MG PO TABS: ORAL_TABLET | ORAL | Status: DC

## 2015-04-19 NOTE — Assessment & Plan Note (Signed)
Left breast invasive ductal carcinoma with DCIS ER/PR positive HER-2 positive: Status post mastectomy, adjuvant chemotherapy in 1 year of Herceptin. Currently on letrozole adjuvant hormonal therapy. Tolerating it very well without any major problems. I  Breast Cancer Surveillance: 1. Breast exam 04/20/15: Normal 2. Mammogram  No abnormalities. Postsurgical changes. Breast Density Category . I recommended that she get 3-D mammograms for surveillance. Discussed the differences between different breast density categories.   Patient will need DEXA scan in one year along with a right breast mammogram. Patient gets annual Pap smears in August of every year.  Survivorship: I encouraged her to continue to take calcium and vitamin D and do physical exercise. Encouraged her to a more fruits and vegetables and decrease red meat. I also encouraged to continue her screenings for colon cancer as recommended by her PCP.  Multiple sclerosis: Causing fatigue and body pains.  Anxiety/depression: On Wellbutrin and Cymbalta.

## 2015-04-20 ENCOUNTER — Ambulatory Visit: Payer: Commercial Managed Care - PPO | Admitting: Hematology and Oncology

## 2015-04-20 ENCOUNTER — Other Ambulatory Visit: Payer: Self-pay

## 2015-04-20 ENCOUNTER — Other Ambulatory Visit: Payer: Commercial Managed Care - PPO

## 2015-04-20 ENCOUNTER — Telehealth: Payer: Self-pay | Admitting: Hematology and Oncology

## 2015-04-20 NOTE — Telephone Encounter (Signed)
Appointments cancelled per patients voicemail and she will call back to reschedule

## 2015-04-24 ENCOUNTER — Other Ambulatory Visit: Payer: Self-pay

## 2015-04-24 ENCOUNTER — Encounter: Payer: Self-pay | Admitting: Hematology and Oncology

## 2015-04-24 MED ORDER — LETROZOLE 2.5 MG PO TABS: 2.5000 mg | ORAL_TABLET | Freq: Every day | ORAL | Status: DC

## 2015-04-24 NOTE — Telephone Encounter (Signed)
Follow up appt made with patient.  Confirmed pharmacy receipt of letrozole.  Pt voiced understanding.  Appt entered.

## 2015-04-27 ENCOUNTER — Ambulatory Visit: Payer: Commercial Managed Care - PPO | Admitting: Hematology and Oncology

## 2015-04-27 NOTE — Assessment & Plan Note (Deleted)
Left breast invasive ductal carcinoma with DCIS ER/PR positive HER-2 positive: Status post mastectomy, adjuvant chemotherapy in 1 year of Herceptin. Currently on letrozole adjuvant hormonal therapy.   Letrozole toxicities: 1. No major hot flashes or myalgias.  Knee infection: patient has had multiple problems with her Left knee And it was recently cleaned and repaired there she continues to have some achiness and pain in the knee.  Breast cancer surveillance: 1. Breast exam done 04/27/2015  2. Mammogram 02/06/2014   Multiple sclerosis: Causing fatigue and body pains. Patient was offered a treatment for MS but she does not want to take it because one of the Adverse effects was neutropenia. Because of her knee infection , she does not want to take any medication that would lower her white blood cell count.  Anxiety/depression: On Wellbutrin and Cymbalta. Hypokalemia : previously potassium is 3.1 in August 2015. I would like to recheck her potassium levels today. We also like to check her CBC in order to see if she is anemic because patient feels fatigued.  Return to clinic in 6 months for follow-up

## 2015-06-02 ENCOUNTER — Telehealth: Payer: Self-pay | Admitting: Hematology and Oncology

## 2015-06-02 NOTE — Telephone Encounter (Signed)
Returned patients call as she needed to reschedule her follow up appointment that she had missed

## 2015-06-09 ENCOUNTER — Ambulatory Visit (HOSPITAL_BASED_OUTPATIENT_CLINIC_OR_DEPARTMENT_OTHER): Payer: Commercial Managed Care - PPO | Admitting: Hematology and Oncology

## 2015-06-09 ENCOUNTER — Telehealth: Payer: Self-pay | Admitting: Hematology and Oncology

## 2015-06-09 VITALS — BP 117/45 | HR 68 | Temp 98.3°F | Resp 19 | Wt 212.0 lb

## 2015-06-09 DIAGNOSIS — Z79811 Long term (current) use of aromatase inhibitors: Secondary | ICD-10-CM | POA: Diagnosis not present

## 2015-06-09 DIAGNOSIS — C50411 Malignant neoplasm of upper-outer quadrant of right female breast: Secondary | ICD-10-CM

## 2015-06-09 DIAGNOSIS — E876 Hypokalemia: Secondary | ICD-10-CM

## 2015-06-09 DIAGNOSIS — G35 Multiple sclerosis: Secondary | ICD-10-CM

## 2015-06-09 DIAGNOSIS — Z170 Estrogen receptor positive status [ER+]: Secondary | ICD-10-CM

## 2015-06-09 DIAGNOSIS — F418 Other specified anxiety disorders: Secondary | ICD-10-CM

## 2015-06-09 NOTE — Progress Notes (Signed)
Patient Care Team: Midge Minium, MD as PCP - General (Family Medicine) Juanda Chance, NP as Nurse Practitioner (Obstetrics and Gynecology) Gatha Mayer, MD as Consulting Physician (Gastroenterology)  DIAGNOSIS: Breast cancer of upper-outer quadrant of right female breast [facility])   Staging form: Breast, AJCC 7th Edition     Clinical: Stage 0 (Tis (DCIS), N0, cM0) - Unsigned       Staging comments: Staged in breast conference 6.12.13        Prognostic indicators: ER equals 100%  PR equals 100%  Ki-67 equals 11%  HER-2/neu equals 3.17, positive      Pathologic: Stage IIA (T1b, N1a, cM0) - Signed by Haywood Lasso, MD on 12/29/2011       Prognostic indicators: ER equals 100%  PR equals 100%  Ki-67 equals 11%  HER-2/neu equals 3.17, positive SUMMARY OF ONCOLOGIC HISTORY:   Breast cancer of upper-outer quadrant of right female breast ([place])   11/22/2011 Initial Diagnosis Cancer of upper-outer quadrant of female breast   12/19/2011 Surgery Left mastectomy with SLN biopsy 2 foci of invasive ductal carcinoma 0.8 and 0.6 cm low-grade ER PR positive HER-2 positive ratio 3.27 Ki-67 11% one lymph node positive out of 4 (T1, N1, M0 stage II)   02/16/2012 - 04/14/2013 Chemotherapy Taxotere, carboplatin, Herceptin x2 cycles complicated by bacteremia and left knee sepsis requiring revision of the knee. Herceptin maintenance every 3 weeks. Herceptin maintenance completed 04/14/2013   04/17/2012 - 06/04/2012 Radiation Therapy Radiation therapy to the breast   03/13/2013 -  Anti-estrogen oral therapy Letrozole 2.5 mg once daily    CHIEF COMPLIANT: follow-up on letrozole  INTERVAL HISTORY: Julie Ross is a 54 year old with above-mentioned history of right breast cancer treated with mastectomy followed by adjuvant chemotherapy and radiation and has been on oral letrozole since September 2014. She has been tolerating letrozole extremely well without any major problems or concerns. She denies any  muscle aches or pains or hot flashes. She has had multiple problems with knee infections for which she has had multiple surgeries. She was also diagnosed with multiple sclerosis.  REVIEW OF SYSTEMS:   Constitutional: Denies fevers, chills or abnormal weight loss Eyes: Denies blurriness of vision Ears, nose, mouth, throat, and face: Denies mucositis or sore throat Respiratory: Denies cough, dyspnea or wheezes Cardiovascular: Denies palpitation, chest discomfort or lower extremity swelling Gastrointestinal:  Denies nausea, heartburn or change in bowel habits Skin: Denies abnormal skin rashes Lymphatics: Denies new lymphadenopathy or easy bruising Neurological:Denies numbness, tingling or new weaknesses Behavioral/Psych: Mood is stable, no new changes  Breast:  denies any pain or lumps or nodules in either breasts All other systems were reviewed with the patient and are negative.  I have reviewed the past medical history, past surgical history, social history and family history with the patient and they are unchanged from previous note.  ALLERGIES:  has No Known Allergies.  MEDICATIONS:  Current Outpatient Prescriptions  Medication Sig Dispense Refill  . ALPRAZolam (XANAX) 0.5 MG tablet Take 1 tablet (0.5 mg total) by mouth 3 (three) times daily as needed for anxiety. 90 tablet 5  . AMITIZA 24 MCG capsule   2  . amLODipine (NORVASC) 10 MG tablet Take 1 tablet (10 mg total) by mouth daily. 90 tablet 1  . atenolol (TENORMIN) 25 MG tablet TK 1/2 T PO D B BRE FOR BP 90 tablet 1  . atorvastatin (LIPITOR) 20 MG tablet Take 1 tablet (20 mg total) by mouth daily. 90 tablet 1  .  baclofen (LIORESAL) 10 MG tablet Take 1 tablet (10 mg total) by mouth 3 (three) times daily. 90 each 11  . buPROPion (WELLBUTRIN XL) 150 MG 24 hr tablet Take 1 tablet (150 mg total) by mouth daily. 90 tablet 1  . CALCIUM-MAGNESIUM PO Take 1 tablet by mouth daily.    Marland Kitchen docusate sodium (COLACE) 50 MG capsule Take 100 mg by  mouth at bedtime.    Marland Kitchen EPINEPHrine 0.3 mg/0.3 mL IJ SOAJ injection Inject 0.3 mLs (0.3 mg total) into the muscle once. 2 Device 2  . gabapentin (NEURONTIN) 300 MG capsule Take 300 mg by mouth 2 (two) times daily.     Marland Kitchen GAVILYTE-G 236 G solution MIX AND DRINK UTD  0  . GENERLAC 10 GM/15ML SOLN TK 30 MLS PO BID  0  . HYDROcodone-acetaminophen (NORCO/VICODIN) 5-325 MG per tablet Take 1 tablet by mouth daily as needed for moderate pain or severe pain.     Marland Kitchen ibuprofen (ADVIL,MOTRIN) 600 MG tablet     . lactulose (CHRONULAC) 10 GM/15ML solution Take 30 mLs (20 g total) by mouth 2 (two) times daily. (Patient not taking: Reported on 02/24/2015) 1892 mL 0  . letrozole (FEMARA) 2.5 MG tablet Take 1 tablet (2.5 mg total) by mouth daily. 90 tablet 3  . levothyroxine (SYNTHROID, LEVOTHROID) 75 MCG tablet Take 1 tablet (75 mcg total) by mouth daily. 90 tablet 1  . Linaclotide (LINZESS) 145 MCG CAPS capsule Take 1 capsule (145 mcg total) by mouth daily. (Patient not taking: Reported on 02/24/2015) 30 capsule 6  . losartan-hydrochlorothiazide (HYZAAR) 100-25 MG tablet TAKE 1 TABLET BY MOUTH EVERY MORNING BEFORE BREAKFAST 90 tablet 1  . Multiple Vitamins-Minerals (MULTIVITAMINS THER. W/MINERALS) TABS Take 1 tablet by mouth daily.    Marland Kitchen omeprazole (PRILOSEC) 40 MG capsule TAKE 1 CAPSULE BY MOUTH EVERY DAY 90 capsule 1  . ondansetron (ZOFRAN) 4 MG tablet One pill as needed up to 2/day 15 tablet 3  . polyethylene glycol powder (MIRALAX) powder Take 1 Container by mouth daily.    . potassium chloride SA (K-DUR,KLOR-CON) 20 MEQ tablet Take 1 tablet (20 mEq total) by mouth daily. 30 tablet 6  . Probiotic Product (PROBIOTIC DAILY PO) Take 1 capsule by mouth daily.     Marland Kitchen venlafaxine XR (EFFEXOR-XR) 150 MG 24 hr capsule Take 1 capsule (150 mg total) by mouth daily with breakfast. 90 capsule 1  . Vitamin D, Ergocalciferol, (DRISDOL) 50000 UNITS CAPS capsule Take 1 capsule (50,000 Units total) by mouth every 7 (seven) days. 4  capsule 2  . vitamin E 400 UNIT capsule Take 800 Units by mouth daily.     No current facility-administered medications for this visit.   Facility-Administered Medications Ordered in Other Visits  Medication Dose Route Frequency Provider Last Rate Last Dose  . ceFAZolin (ANCEF) IVPB 2 g/50 mL premix    PRN Tawni Millers, CRNA   2 g at 04/19/13 0956  . dexamethasone (DECADRON) injection    PRN Tawni Millers, CRNA   8 mg at 05/01/12 0908  . fentaNYL (SUBLIMAZE) injection    PRN Tawni Millers, CRNA   100 mcg at 05/01/12 0856  . lidocaine (cardiac) 100 mg/38m (XYLOCAINE) 20 MG/ML injection 2%    PRN WTawni Millers CRNA   60 mg at 05/01/12 0856  . midazolam (VERSED) 5 MG/5ML injection    PRN WTawni Millers CRNA   2 mg at 05/01/12 0856  . propofol (DIPRIVAN) 10 mg/mL bolus/IV push    PRN WTawni Millers  CRNA   160 mg at 05/01/12 0900    PHYSICAL EXAMINATION: ECOG PERFORMANCE STATUS: 1 - Symptomatic but completely ambulatory  Filed Vitals:   06/09/15 1515  BP: 117/45  Pulse: 68  Temp: 98.3 F (36.8 C)  Resp: 19   Filed Weights   06/09/15 1515  Weight: 212 lb (96.163 kg)    GENERAL:alert, no distress and comfortable SKIN: skin color, texture, turgor are normal, no rashes or significant lesions EYES: normal, Conjunctiva are pink and non-injected, sclera clear OROPHARYNX:no exudate, no erythema and lips, buccal mucosa, and tongue normal  NECK: supple, thyroid normal size, non-tender, without nodularity LYMPH:  no palpable lymphadenopathy in the cervical, axillary or inguinal LUNGS: clear to auscultation and percussion with normal breathing effort HEART: regular rate & rhythm and no murmurs and no lower extremity edema ABDOMEN:abdomen soft, non-tender and normal bowel sounds Musculoskeletal:no cyanosis of digits and no clubbing  NEURO: alert & oriented x 3 with fluent speech, no focal motor/sensory deficits BREAST: left mastectomy, right breast no palpable lumps or nodules (exam  performed in the presence of a chaperone)  LABORATORY DATA:  I have reviewed the data as listed   Chemistry      Component Value Date/Time   NA 136 01/22/2015 1531   NA 138 08/18/2014 1551   K 3.5 01/22/2015 1531   K 3.7 08/18/2014 1551   CL 99 01/22/2015 1531   CL 100 11/30/2012 1029   CO2 27 01/22/2015 1531   CO2 27 08/18/2014 1551   BUN 22 01/22/2015 1531   BUN 17.2 08/18/2014 1551   CREATININE 1.11 01/22/2015 1531   CREATININE 1.3* 08/18/2014 1551      Component Value Date/Time   CALCIUM 9.7 01/22/2015 1531   CALCIUM 9.1 08/18/2014 1551   ALKPHOS 162* 01/22/2015 1531   ALKPHOS 137 08/18/2014 1551   AST 18 01/22/2015 1531   AST 19 08/18/2014 1551   ALT 16 01/22/2015 1531   ALT 21 08/18/2014 1551   BILITOT 0.7 01/22/2015 1531   BILITOT 0.6 10/15/2014 1420   BILITOT 0.46 08/18/2014 1551       Lab Results  Component Value Date   WBC 8.5 12/17/2014   HGB 13.5 12/17/2014   HCT 40.2 12/17/2014   MCV 84.6 12/17/2014   PLT 274.0 12/17/2014   NEUTROABS 6.2 12/17/2014   ASSESSMENT & PLAN:  Breast cancer of upper-outer quadrant of right female breast (HCC) Left breast invasive ductal carcinoma with DCIS ER/PR positive HER-2 positive: Status post mastectomy, adjuvant chemotherapy in 1 year of Herceptin. Currently on letrozole adjuvant hormonal therapy started 03/13/13 .   Letrozole toxicities: 1. No major hot flashes or myalgias.  Knee infection: patient has had multiple problems with her Left kneepreviously. Now it is slightly better.   Breast cancer surveillance: 1. Breast exam done 04/27/2015  2. [facility] We'll obtain the report. 3. [facility]. We will request a copy of this report as well.   Multiple sclerosis: Causing fatigue and body pains.  Anxiety/depression: On Wellbutrin and Cymbalta. Hypokalemia : previously potassium is 3.1 in August 2015. I would like to recheck  her potassium levels today. We also like to check her CBC in order to see if she is anemic because patient feels fatigued.  Return to clinic in 1 year for follow-up   No orders of the defined types were placed in this encounter.   The patient has a good understanding of  the overall plan. she agrees with it. she will call with any problems that may develop before the next visit here.   Rulon Eisenmenger, MD 06/09/2015

## 2015-06-09 NOTE — Assessment & Plan Note (Signed)
Left breast invasive ductal carcinoma with DCIS ER/PR positive HER-2 positive: Status post mastectomy, adjuvant chemotherapy in 1 year of Herceptin. Currently on letrozole adjuvant hormonal therapy started 03/13/13 .   Letrozole toxicities: 1. No major hot flashes or myalgias.  Knee infection: patient has had multiple problems with her Left knee And it was recently cleaned and repaired there she continues to have some achiness and pain in the knee.  Breast cancer surveillance: 1. Breast exam done 04/27/2015  2. Mammogram 02/06/2014   Multiple sclerosis: Causing fatigue and body pains. Patient was offered a treatment for MS but she does not want to take it because one of the Adverse effects was neutropenia. Because of her knee infection , she does not want to take any medication that would lower her white blood cell count.  Anxiety/depression: On Wellbutrin and Cymbalta. Hypokalemia : previously potassium is 3.1 in August 2015. I would like to recheck her potassium levels today. We also like to check her CBC in order to see if she is anemic because patient feels fatigued.  Return to clinic in 6 months for follow-up

## 2015-06-09 NOTE — Telephone Encounter (Signed)
Appointments made and avs printed for patient °

## 2015-06-18 ENCOUNTER — Encounter: Payer: Self-pay | Admitting: Family Medicine

## 2015-06-18 ENCOUNTER — Telehealth: Payer: Self-pay | Admitting: *Deleted

## 2015-06-18 ENCOUNTER — Ambulatory Visit (INDEPENDENT_AMBULATORY_CARE_PROVIDER_SITE_OTHER): Payer: Commercial Managed Care - PPO | Admitting: Family Medicine

## 2015-06-18 VITALS — BP 110/64 | HR 75 | Temp 97.7°F | Resp 18 | Ht 67.0 in | Wt 212.2 lb

## 2015-06-18 DIAGNOSIS — I10 Essential (primary) hypertension: Secondary | ICD-10-CM

## 2015-06-18 DIAGNOSIS — J0100 Acute maxillary sinusitis, unspecified: Secondary | ICD-10-CM

## 2015-06-18 DIAGNOSIS — E785 Hyperlipidemia, unspecified: Secondary | ICD-10-CM | POA: Diagnosis not present

## 2015-06-18 LAB — CBC WITH DIFFERENTIAL/PLATELET
Basophils Absolute: 0.1 10*3/uL (ref 0.0–0.1)
Basophils Relative: 0.7 % (ref 0.0–3.0)
Eosinophils Absolute: 0.3 10*3/uL (ref 0.0–0.7)
Eosinophils Relative: 3.3 % (ref 0.0–5.0)
HCT: 38.9 % (ref 36.0–46.0)
Hemoglobin: 12.9 g/dL (ref 12.0–15.0)
Lymphocytes Relative: 18.3 % (ref 12.0–46.0)
Lymphs Abs: 1.5 10*3/uL (ref 0.7–4.0)
MCHC: 33.2 g/dL (ref 30.0–36.0)
MCV: 84 fl (ref 78.0–100.0)
Monocytes Absolute: 0.3 10*3/uL (ref 0.1–1.0)
Monocytes Relative: 3.8 % (ref 3.0–12.0)
Neutro Abs: 6 10*3/uL (ref 1.4–7.7)
Neutrophils Relative %: 73.9 % (ref 43.0–77.0)
Platelets: 287 10*3/uL (ref 150.0–400.0)
RBC: 4.63 Mil/uL (ref 3.87–5.11)
RDW: 13.6 % (ref 11.5–15.5)
WBC: 8.1 10*3/uL (ref 4.0–10.5)

## 2015-06-18 LAB — BASIC METABOLIC PANEL
BUN: 22 mg/dL (ref 6–23)
CO2: 24 mEq/L (ref 19–32)
Calcium: 9.4 mg/dL (ref 8.4–10.5)
Chloride: 103 mEq/L (ref 96–112)
Creatinine, Ser: 1.21 mg/dL — ABNORMAL HIGH (ref 0.40–1.20)
GFR: 48.78 mL/min — ABNORMAL LOW (ref >60.00)
Glucose, Bld: 139 mg/dL — ABNORMAL HIGH (ref 70–99)
Potassium: 3.1 mEq/L — ABNORMAL LOW (ref 3.5–5.1)
Sodium: 140 mEq/L (ref 135–145)

## 2015-06-18 LAB — HEPATIC FUNCTION PANEL
ALT: 23 U/L (ref 0–35)
AST: 17 U/L (ref 0–37)
Albumin: 4.1 g/dL (ref 3.5–5.2)
Alkaline Phosphatase: 241 U/L — ABNORMAL HIGH (ref 39–117)
Bilirubin, Direct: 0.1 mg/dL (ref 0.0–0.3)
Total Bilirubin: 0.5 mg/dL (ref 0.2–1.2)
Total Protein: 7.5 g/dL (ref 6.0–8.3)

## 2015-06-18 LAB — LIPID PANEL
Cholesterol: 158 mg/dL (ref 0–200)
HDL: 53 mg/dL (ref >39.00)
LDL Cholesterol: 73 mg/dL (ref 0–99)
NonHDL: 105.26
Total CHOL/HDL Ratio: 3
Triglycerides: 160 mg/dL — ABNORMAL HIGH (ref 0.0–149.0)
VLDL: 32 mg/dL (ref 0.0–40.0)

## 2015-06-18 MED ORDER — PROMETHAZINE-DM 6.25-15 MG/5ML PO SYRP: 5.0000 mL | ORAL_SOLUTION | Freq: Four times a day (QID) | ORAL | Status: DC | PRN

## 2015-06-18 MED ORDER — AMOXICILLIN 875 MG PO TABS: 875.0000 mg | ORAL_TABLET | Freq: Two times a day (BID) | ORAL | Status: DC

## 2015-06-18 NOTE — Assessment & Plan Note (Signed)
Pt's sxs and PE consistent w/ infxn.  Start abx.  Cough meds prn.  Reviewed supportive care and red flags that should prompt return.  Pt expressed understanding and is in agreement w/ plan.  

## 2015-06-18 NOTE — Progress Notes (Signed)
Pre visit review using our clinic review tool, if applicable. No additional management support is needed unless otherwise documented below in the visit note. 

## 2015-06-18 NOTE — Progress Notes (Signed)
   Subjective:    Patient ID: Julie Ross, female    DOB: 08-17-1960, 54 y.o.   MRN: [id number]  HPI HTN- chronic problem, on Amlodipine.  Excellent control today.  No CP, SOB, HAs, visual changes, edema.  Hyperlipidemia- chronic problem, on Lipitor.  Denies abd pain, N/V, myalgias.  URI- sxs started ~2 weeks ago.  No fevers.  + dry, hacking cough.  Copious nasal congestion.  + maxillary sinus pressure.  + HA.  Uncomfortable to lean forward.  No tooth pain.  No ear pain.  + sick contacts.   Review of Systems For ROS see HPI     Objective:   Physical Exam  Constitutional: She is oriented to person, place, and time. She appears well-developed and well-nourished. No distress.  HENT:  Head: Normocephalic and atraumatic.  Right Ear: Tympanic membrane normal.  Left Ear: Tympanic membrane normal.  Nose: Mucosal edema and rhinorrhea present. Right sinus exhibits maxillary sinus tenderness and frontal sinus tenderness. Left sinus exhibits maxillary sinus tenderness and frontal sinus tenderness.  Mouth/Throat: Uvula is midline and mucous membranes are normal. Posterior oropharyngeal erythema present. No oropharyngeal exudate.  Eyes: Conjunctivae and EOM are normal. Pupils are equal, round, and reactive to light.  Neck: Normal range of motion. Neck supple.  Cardiovascular: Normal rate, regular rhythm and normal heart sounds.   Pulmonary/Chest: Effort normal and breath sounds normal. No respiratory distress. She has no wheezes.  Abdominal: Soft. Bowel sounds are normal. She exhibits no distension. There is no tenderness. There is no rebound.  Musculoskeletal: She exhibits no edema.  Lymphadenopathy:    She has no cervical adenopathy.  Neurological: She is alert and oriented to person, place, and time.  Skin: Skin is warm and dry.  Psychiatric: She has a normal mood and affect. Her behavior is normal. Thought content normal.  Vitals reviewed.         Assessment & Plan:

## 2015-06-18 NOTE — Assessment & Plan Note (Signed)
Chronic problem.  Excellent control today.  Asymptomatic.  Check labs.  No anticipated med changes. 

## 2015-06-18 NOTE — Assessment & Plan Note (Signed)
Chronic problem.  Tolerating statin w/o difficulty.  Check labs.  Adjust meds prn  

## 2015-06-18 NOTE — Telephone Encounter (Signed)
Received bone density and mammo report from [facility], reviewed by Dr. Lindi Adie, sent to scan.

## 2015-06-18 NOTE — Patient Instructions (Signed)
Schedule your complete physical in 6 months We'll notify of your lab results and make any changes if needed Start the Amoxicillin twice daily- take w/ food Use the cough syrup for nights and weekends- will cause drowsiness REST! Plenty of fluids Mucinex DM for daytime cough Call with any questions or concerns If you want to join [place] at the new [place] office, any scheduled appointments will automatically transfer and we will see you at [address] [place] [address] [place], [place], [place] [address] ([place]) [place] in there!!! Happy New Year!!!

## 2015-06-19 ENCOUNTER — Other Ambulatory Visit (INDEPENDENT_AMBULATORY_CARE_PROVIDER_SITE_OTHER): Payer: Commercial Managed Care - PPO

## 2015-06-19 DIAGNOSIS — R7309 Other abnormal glucose: Secondary | ICD-10-CM

## 2015-06-19 LAB — GAMMA GT: GGT: 114 U/L — ABNORMAL HIGH (ref 7–51)

## 2015-06-19 LAB — HEMOGLOBIN A1C: Hgb A1c MFr Bld: 6.2 % (ref 4.6–6.5)

## 2015-06-23 ENCOUNTER — Other Ambulatory Visit: Payer: Self-pay | Admitting: Family Medicine

## 2015-06-23 DIAGNOSIS — R748 Abnormal levels of other serum enzymes: Secondary | ICD-10-CM

## 2015-06-30 ENCOUNTER — Other Ambulatory Visit: Payer: Self-pay

## 2015-06-30 MED ORDER — BUPROPION HCL ER (XL) 150 MG PO TB24: 150.0000 mg | ORAL_TABLET | Freq: Every day | ORAL | Status: DC

## 2015-06-30 MED ORDER — VENLAFAXINE HCL ER 150 MG PO CP24: 150.0000 mg | ORAL_CAPSULE | Freq: Every day | ORAL | Status: DC

## 2015-07-01 ENCOUNTER — Telehealth: Payer: Self-pay | Admitting: *Deleted

## 2015-07-01 ENCOUNTER — Encounter: Payer: Self-pay | Admitting: Gastroenterology

## 2015-07-01 ENCOUNTER — Other Ambulatory Visit: Payer: Commercial Managed Care - PPO

## 2015-07-01 ENCOUNTER — Ambulatory Visit (INDEPENDENT_AMBULATORY_CARE_PROVIDER_SITE_OTHER): Payer: Commercial Managed Care - PPO | Admitting: Gastroenterology

## 2015-07-01 VITALS — BP 110/74 | HR 78 | Ht 66.0 in | Wt 211.0 lb

## 2015-07-01 DIAGNOSIS — K5900 Constipation, unspecified: Secondary | ICD-10-CM

## 2015-07-01 DIAGNOSIS — R748 Abnormal levels of other serum enzymes: Secondary | ICD-10-CM

## 2015-07-01 MED ORDER — LUBIPROSTONE 24 MCG PO CAPS: 24.0000 ug | ORAL_CAPSULE | Freq: Two times a day (BID) | ORAL | Status: DC

## 2015-07-01 NOTE — Telephone Encounter (Signed)
Patient form on Faith desk. 

## 2015-07-01 NOTE — Patient Instructions (Signed)
Your physician has requested that you go to the basement for lab work before leaving today.   We have printed your prescription for Amitiza and given you a Rx card.

## 2015-07-02 ENCOUNTER — Ambulatory Visit: Payer: Self-pay | Admitting: Neurology

## 2015-07-02 LAB — HEPATITIS C ANTIBODY: HCV Ab: NEGATIVE

## 2015-07-02 LAB — ANA: Anti Nuclear Antibody(ANA): NEGATIVE

## 2015-07-02 LAB — MITOCHONDRIAL ANTIBODIES: Mitochondrial M2 Ab, IgG: <=20 Units (ref <=20.0)

## 2015-07-02 LAB — HEPATITIS B SURFACE ANTIGEN: Hepatitis B Surface Ag: NEGATIVE

## 2015-07-02 LAB — HEPATITIS A ANTIBODY, IGM: Hep A IgM: NONREACTIVE

## 2015-07-02 LAB — HEPATITIS B SURFACE ANTIBODY,QUALITATIVE: Hep B S Ab: NEGATIVE

## 2015-07-03 ENCOUNTER — Encounter: Payer: Self-pay | Admitting: Gastroenterology

## 2015-07-03 DIAGNOSIS — Z0279 Encounter for issue of other medical certificate: Secondary | ICD-10-CM

## 2015-07-03 DIAGNOSIS — R748 Abnormal levels of other serum enzymes: Secondary | ICD-10-CM | POA: Insufficient documentation

## 2015-07-03 LAB — ANTI-SMOOTH MUSCLE ANTIBODY, IGG: Smooth Muscle Ab: <20 U (ref <20)

## 2015-07-03 NOTE — Progress Notes (Signed)
     07/03/2015 Julie Ross [id number] 25-Mar-1961   History of Present Illness:  This is a 55 year old female who is known to Dr. Carlean Purl. She is here today at the request of her PCP due to an elevated alkaline phosphatase level. This has been elevated slightly for at least the past year, but on most recent labs 2 weeks ago it was further elevated at 241. Remaining LFTs are normal.  GGT was also elevated at 114. Ultrasound showed 8 mm gallbladder polyp versus adherent sludge as well as liver that was slightly echogenic consistent fatty infiltration and/or hepatocellular disease.  She was seen here last in June of this year by one of our nurse practitioners for constipation. It was recommended that she have anorectal manometry.  She was not satisfied with that visit so then went to [facility] and had a colonoscopy done there on 02/25/2015 by Dr. Jerl Santos that was normal. They apparently wanted her to have an anorectal manometry as well as we recommended, but she says that after her colonoscopy her constipation improved. She was taking Amitiza 24 g twice a day, but ran out of that recently and has been doing well without it.  Just of note, she does have history of breast cancer and has MS as well as several other medical problems.  Current Medications, Allergies, Past Medical History, Past Surgical History, Family History and Social History were reviewed in [employer] record.   Physical Exam: BP 110/74 mmHg  Pulse 78  Ht 5\' 6"  (1.676 m)  Wt 211 lb (95.709 kg)  BMI 34.07 kg/m2  LMP 12/05/2011 General: Well developed white female in no acute distress Head: Normocephalic and atraumatic Eyes:  Sclerae anicteric, conjunctiva pink  Ears: Normal auditory acuity Lungs: Clear throughout to auscultation Heart: Regular rate and rhythm Abdomen: Soft, non-distended.  Normal bowel sounds.  Non-tender. Musculoskeletal: Symmetrical with no gross deformities  Extremities: No  edema  Neurological: Alert oriented x 4, grossly non-focal Psychological:  Alert and cooperative. Normal mood and affect  Assessment and Recommendations: -Elevated alkaline phosphatase:  This is the only elevated liver function tests. Now higher than it had been previously, although, it has been slightly elevated for some time. GGT also elevated.  Ultrasound with gallbladder polyp or sludge and some fatty liver. Discussed with Dr. Carlean Purl who recommended checking viral hepatitis studies, AMA, ANA, and anti-smooth muscle antibodies. -Constipation:  Improved recently.  Was previously on Amitiza 24 mcg BID, but has not been taking it and doing well.  We will renew prescription for her in case she decides to restart this.

## 2015-07-06 ENCOUNTER — Ambulatory Visit (INDEPENDENT_AMBULATORY_CARE_PROVIDER_SITE_OTHER): Payer: Commercial Managed Care - PPO | Admitting: Neurology

## 2015-07-06 ENCOUNTER — Encounter: Payer: Self-pay | Admitting: Neurology

## 2015-07-06 VITALS — BP 116/78 | HR 66 | Resp 16 | Ht 66.0 in | Wt 211.0 lb

## 2015-07-06 DIAGNOSIS — Z79899 Other long term (current) drug therapy: Secondary | ICD-10-CM

## 2015-07-06 DIAGNOSIS — G2581 Restless legs syndrome: Secondary | ICD-10-CM

## 2015-07-06 DIAGNOSIS — R5383 Other fatigue: Secondary | ICD-10-CM | POA: Diagnosis not present

## 2015-07-06 DIAGNOSIS — F329 Major depressive disorder, single episode, unspecified: Secondary | ICD-10-CM | POA: Diagnosis not present

## 2015-07-06 DIAGNOSIS — F32A Depression, unspecified: Secondary | ICD-10-CM

## 2015-07-06 DIAGNOSIS — G35 Multiple sclerosis: Secondary | ICD-10-CM | POA: Diagnosis not present

## 2015-07-06 DIAGNOSIS — R413 Other amnesia: Secondary | ICD-10-CM

## 2015-07-06 MED ORDER — BUPROPION HCL ER (XL) 300 MG PO TB24: 300.0000 mg | ORAL_TABLET | Freq: Every day | ORAL | Status: DC

## 2015-07-06 NOTE — Progress Notes (Signed)
[facility]  PATIENT: Julie Ross DOB: Feb 23, 1961  REFERRING DOCTOR OR PCP:  Annye Asa SOURCE: patient and records from [facility]  _________________________________   HISTORICAL  CHIEF COMPLAINT:  Chief Complaint  Patient presents with  . Multiple Sclerosis    Sts. she is having more difficulty with memory/concentration. Sts. her husband can tell her something, but just a few min. later she doesn't remember/fim    HISTORY OF PRESENT ILLNESS:  Julie Ross is a 55 year old woman with multiple sclerosis.   She is currently off a disease modifying therapy. Aubagio was prescribed but she decided not to take it and reports her orthopedic surgeon refers her to not be on any immunomodulatory agent at this time due to her septic joint in the past. She will most likely have another operation on the left knee.  Cognition:   She is noting more difficulty with memory and focus.   Specifically, she has noted more difficulty with reading and often backtracks a page or chapter because she does not remember.   Concentration is worse.   She has some word finding difficulty.    She scored 25/30 on MOCA, losing 3 points for delayed recall but remembering after hints.      Mood:    She notes more depression.   She gets angry and irritable easily.   She is apathetic.    She has not changed weight.      Fatigue/sleep:   She notes a lot of fatigue, both physical and mental.    Worse with infection.    She has severe obstructive sleep apnea (AHI=33) that was diagnosed on 11/20/2013.  She was placed on Auto-PAP but has had difficulty tolerating it.  She has used the PAP very intermittently.    She still is waking up gasping some nights.    She also has moderate RLS with some impact on sleep.  She is on gabapentin 300 mg at night.   She also takes xanax at bedtime.      During the day she is very sleepy but then has trouble falling asleep at night.      She tried Provigil  but it made her jittery.    She has never tried a stimulant  Gait/strength/sensation:   Gait issues are better as left knee is better.   Her legs fatigue easily but not actually weak.   Gait is balanced.  She occasioanally trips over her feet but has not fallen.  She denies any leg numbness.  Vision:   She had left optic neuritis in 2012. Visual acuity improved but not to baseline.   Colors are mildly desaturated  Bladder/bowel:   Bladder function is fine.  She was having constipation helped by Amitiza, though it is very expensive.          She has had multiple infections in the left knee during chemotherapy.   She has had bilateral TKR.   Due to infections and hardware infection, she has had 4 operations on the left.    Her orthopedics prefers not to be on any medication that might affect the immune system being.s  MS History:    She was diagnosed with MS in 1999 after presenting with optic neuritis.   She had a spinal tap, MRI and angiogram.   Initially, the diagnosis was uncertain but she had changes in the MRI over time leading to a diagnosis of MS a few years later.    Initially, she was  placed on Copaxone, then switched to Avonex due to skin reactions.   She then switched to Tysabri when she had some mild breakthrough disease. She tolerated Tysabri very well and her MS did well.  However, she was JCV antibody positive so she stopped in 2012.   She did not restart Avonex as she did not want to go back to an injection.    Her last exacerbation was optic neuritis in 2012 treated with steroids. She proved but did not get back to baseline.  This exacerbation occurred off any DMT.  She tried Gilenya in 2013. She had trouble tolerating Gilenya and discontinued shortly after starting it. She was going to start Tecfidera but was diagnosed with breast cancer in 2013 and opted not to start at that time. She received chemotherapy including Taxotere, carboplatin, Herceptin in 2013 and 2014. An MRI of the brain in  2015 showed only mild MS progression compared to her previous one in 2011.   Aubagio was recommended but this is being held as orthopedics has told her that they prefer her not to be on any immunomodulatory therapy.   REVIEW OF SYSTEMS: Constitutional: No fevers, chills, sweats, or change in appetite.  Notes fatigue Eyes: No visual changes, double vision, eye pain Ear, nose and throat: No hearing loss, ear pain, nasal congestion, sore throat.  Recent URI Cardiovascular: No chest pain, palpitations Respiratory: No shortness of breath at rest or with exertion.   No wheezes.  Has OSA GastrointestinaI: No nausea, vomiting, diarrhea, abdominal pain, fecal incontinence Genitourinary: No dysuria, urinary retention or frequency.  She has 1 times nocturia most nights Musculoskeletal: No neck pain, back pain.  She has bilateral knee pain Integumentary: No rash, pruritus, skin lesions Neurological: as above Psychiatric: No depression at this time.  Some anxiety Endocrine: No palpitations, diaphoresis, change in appetite, change in weigh or increased thirst Hematologic/Lymphatic: No anemia, purpura, petechiae. Allergic/Immunologic: No itchy/runny eyes, nasal congestion, recent allergic reactions, rashes  ALLERGIES: Allergies  Allergen Reactions  . Bee Venom Anaphylaxis    HOME MEDICATIONS:  Current outpatient prescriptions:  .  ALPRAZolam (XANAX) 0.5 MG tablet, Take 1 tablet (0.5 mg total) by mouth 3 (three) times daily as needed for anxiety., Disp: 90 tablet, Rfl: 5 .  amLODipine (NORVASC) 10 MG tablet, Take 1 tablet (10 mg total) by mouth daily., Disp: 90 tablet, Rfl: 1 .  amoxicillin (AMOXIL) 875 MG tablet, Take 1 tablet (875 mg total) by mouth 2 (two) times daily., Disp: 20 tablet, Rfl: 0 .  atenolol (TENORMIN) 25 MG tablet, TK 1/2 T PO D B BRE FOR BP, Disp: 90 tablet, Rfl: 1 .  atorvastatin (LIPITOR) 20 MG tablet, Take 1 tablet (20 mg total) by mouth daily., Disp: 90 tablet, Rfl: 1 .   gabapentin (NEURONTIN) 300 MG capsule, Take 300 mg by mouth 2 (two) times daily. , Disp: , Rfl:  .  HYDROcodone-acetaminophen (NORCO/VICODIN) 5-325 MG per tablet, Take 1 tablet by mouth daily as needed for moderate pain or severe pain. , Disp: , Rfl:  .  ibuprofen (ADVIL,MOTRIN) 600 MG tablet, , Disp: , Rfl:  .  letrozole (FEMARA) 2.5 MG tablet, Take 1 tablet (2.5 mg total) by mouth daily., Disp: 90 tablet, Rfl: 3 .  levothyroxine (SYNTHROID, LEVOTHROID) 75 MCG tablet, Take 1 tablet (75 mcg total) by mouth daily., Disp: 90 tablet, Rfl: 1 .  losartan-hydrochlorothiazide (HYZAAR) 100-25 MG tablet, TAKE 1 TABLET BY MOUTH EVERY MORNING BEFORE BREAKFAST, Disp: 90 tablet, Rfl: 1 .  lubiprostone (  AMITIZA) 24 MCG capsule, Take 1 capsule (24 mcg total) by mouth 2 (two) times daily with a meal., Disp: 180 capsule, Rfl: 2 .  Multiple Vitamins-Minerals (MULTIVITAMINS THER. W/MINERALS) TABS, Take 1 tablet by mouth daily., Disp: , Rfl:  .  omeprazole (PRILOSEC) 40 MG capsule, TAKE 1 CAPSULE BY MOUTH EVERY DAY, Disp: 90 capsule, Rfl: 1 .  potassium chloride SA (K-DUR,KLOR-CON) 20 MEQ tablet, Take 1 tablet (20 mEq total) by mouth daily., Disp: 30 tablet, Rfl: 6 .  Probiotic Product (PROBIOTIC DAILY PO), Take 1 capsule by mouth daily. , Disp: , Rfl:  .  promethazine-dextromethorphan (PROMETHAZINE-DM) 6.25-15 MG/5ML syrup, Take 5 mLs by mouth 4 (four) times daily as needed., Disp: 240 mL, Rfl: 0 .  venlafaxine XR (EFFEXOR-XR) 150 MG 24 hr capsule, Take 1 capsule (150 mg total) by mouth daily with breakfast., Disp: 90 capsule, Rfl: 1 .  vitamin E 400 UNIT capsule, Take 800 Units by mouth daily., Disp: , Rfl:  .  baclofen (LIORESAL) 10 MG tablet, Take 1 tablet (10 mg total) by mouth 3 (three) times daily., Disp: 90 each, Rfl: 11 .  buPROPion (WELLBUTRIN XL) 150 MG 24 hr tablet, Take 1 tablet (150 mg total) by mouth daily., Disp: 90 tablet, Rfl: 1 .  CALCIUM-MAGNESIUM PO, Take 1 tablet by mouth daily., Disp: , Rfl:  .   docusate sodium (COLACE) 50 MG capsule, Take 100 mg by mouth at bedtime., Disp: , Rfl:  .  EPINEPHrine 0.3 mg/0.3 mL IJ SOAJ injection, Inject 0.3 mLs (0.3 mg total) into the muscle once., Disp: 2 Device, Rfl: 2 No current facility-administered medications for this visit.  Facility-Administered Medications Ordered in Other Visits:  .  ceFAZolin (ANCEF) IVPB 2 g/50 mL premix, , , PRN, Tawni Millers, CRNA, 2 g at 04/19/13 0956 .  dexamethasone (DECADRON) injection, , , PRN, Tawni Millers, CRNA, 8 mg at 05/01/12 0908 .  fentaNYL (SUBLIMAZE) injection, , , PRN, Tawni Millers, CRNA, 100 mcg at 05/01/12 0856 .  lidocaine (cardiac) 100 mg/44ml (XYLOCAINE) 20 MG/ML injection 2%, , , PRN, Tawni Millers, CRNA, 60 mg at 05/01/12 0856 .  midazolam (VERSED) 5 MG/5ML injection, , , PRN, Tawni Millers, CRNA, 2 mg at 05/01/12 0856 .  propofol (DIPRIVAN) 10 mg/mL bolus/IV push, , , PRN, Tawni Millers, CRNA, 160 mg at 05/01/12 0900  PAST MEDICAL HISTORY: Past Medical History  Diagnosis Date  . Hypertension   . Hypothyroidism   . Neuromuscular disorder ([place])     MS TX WITH CYMBALTA   . Multiple sclerosis ([place])   . H/O colonoscopy   . H/O bone density study 03/2011  . Wears glasses   . Heart burn   . Depression   . Hx of radiation therapy 04/17/12- 06/04/12    left chest wall, axilla, L supraclavicular fossa 4500 cGy, left mastectomy scar/chest wall 5940 cGy  . H/O echocardiogram     last one 02/2013, due to chemotherapy  . Sleep apnea     MILD SLEEP APNEA, NO (Needed) MACHINE  6 YRS AGO HPT [facility]   . GERD (gastroesophageal reflux disease)   . Breast cancer ([place]) 11/2011    lul/ER+PR+, x1 lymph node   . Cancer (HCC)     squamous cell skin cancer x7  . Arthritis     knees   . Anemia     h/o - ? bld. transfusion - more recent- ?2013  . Anxiety     uses xanax mainly for sleep    PAST  SURGICAL HISTORY: Past Surgical History  Procedure Laterality Date  . Tonsillectomy    . Tumor removal       FROM PELVIS AGE 38  . Cesarean section      X2   . No past surgeries      BLADDER SLING  4-5 YRS AGO   . Knee arthroscopy      LT KNEE 04/2011  . Total knee arthroplasty  08/15/2011    Procedure: TOTAL KNEE ARTHROPLASTY; lft Surgeon: Alta Corning, MD;  Location: [place];  Service: Orthopedics;  Laterality: Left;  RIGHT KNEE CORTIZONE INJECTION  . Mohs surgery      right face  . Knee arthroscopy  82    rt  . Mastectomy w/ sentinel node biopsy  12/19/2011    Procedure: MASTECTOMY WITH SENTINEL LYMPH NODE BIOPSY;  Surgeon: Haywood Lasso, MD;  Location: [place];  Service: General;  Laterality: Left;  left breast and left axilla   . Portacath placement  01/16/2012    Procedure: INSERTION PORT-A-CATH;  Surgeon: Haywood Lasso, MD;  Location: [place];  Service: General;  Laterality: Right;  [place] Cath Placement   . I&d knee with poly exchange  03/02/2012    Procedure: IRRIGATION AND DEBRIDEMENT KNEE WITH POLY EXCHANGE;  Surgeon: Alta Corning, MD;  Location: [place];  Service: Orthopedics;  Laterality: Left;  I&D of total knee with Possible Poly Exchange   . Tee without cardioversion  03/06/2012    Procedure: TRANSESOPHAGEAL ECHOCARDIOGRAM (TEE);  Surgeon: Josue Hector, MD;  Location: [place];  Service: Cardiovascular;  Laterality: N/A;  Rm. 2927  . Port-a-cath removal  03/06/2012    Procedure: REMOVAL PORT-A-CATH;  Surgeon: Odis Hollingshead, MD;  Location: [place];  Service: General;  Laterality: N/A;  . Portacath placement  05/01/2012    Procedure: INSERTION PORT-A-CATH;  Surgeon: Haywood Lasso, MD;  Location: [place];  Service: General;  Laterality: Right;  right internal jugular port-a-cath insertion  . Total knee arthroplasty Left 08/18/2012    Procedure:  Irrigation and debridement of LEFT total knee;  Removal of total knee parts; Implant of spacers;  Surgeon: Kerin Salen, MD;  Location: [place];  Service: Orthopedics;  Laterality: Left;  .  Port-a-cath removal Right 08/22/2012    Procedure: REMOVAL PORT-A-CATH;  Surgeon: Edward Jolly, MD;  Location: [place];  Service: General;  Laterality: Right;  . Joint replacement  Feb 2013    left knee- multiple surgeries   . Mastectomy    . Total knee revision Left 04/19/2013    Procedure: TOTAL KNEE REVISION AND REMOVAL CEMENT SPACER;  Surgeon: Kerin Salen, MD;  Location: [place];  Service: Orthopedics;  Laterality: Left;  . Total knee arthroplasty Right 08/12/2013  . Total knee arthroplasty Right 08/12/2013    Procedure: RIGHT TOTAL KNEE ARTHROPLASTY;  Surgeon: Kerin Salen, MD;  Location: [place];  Service: Orthopedics;  Laterality: Right;  . Esophagogastroduodenoscopy    . Colonoscopy    . Flexible sigmoidoscopy      FAMILY HISTORY: Family History  Problem Relation Age of Onset  . Breast cancer Paternal Aunt     dx in her 61s  . Heart disease Maternal Grandfather   . Lung cancer Maternal Uncle     smoker  . Breast cancer Paternal Grandmother     dx in her 5s  . Ovarian cancer Paternal Aunt     dx in her 10s  .  Arthritis Mother   . Hypertension Mother   . Heart disease Mother     CHF  . Dementia Father   . Diabetes type II Father     SOCIAL HISTORY:  Social History   Social History  . Marital Status: Married    Spouse Name: N/A  . Number of Children: 2  . Years of Education: N/A   Occupational History  .     Social History Main Topics  . Smoking status: Never Smoker   . Smokeless tobacco: Never Used  . Alcohol Use: No  . Drug Use: No  . Sexual Activity: Yes   Other Topics Concern  . Not on file   Social History Narrative     PHYSICAL EXAM  Filed Vitals:   07/06/15 1321  BP: 116/78  Pulse: 66  Resp: 16  Height: 5\' 6"  (1.676 m)  Weight: 211 lb (95.709 kg)    Body mass index is 34.07 kg/(m^2).   General: The patient is well-developed and well-nourished and in no acute distress   Neurologic Exam  Mental status: The patient is alert and  oriented x 3 at the time of the examination. The patient has apparent normal recent and remote memory, with an apparently normal attention span and concentration ability.   Speech is normal.  Cranial nerves: Extraocular movements are full.   There is good facial sensation to soft touch bilaterally.Facial strength is normal.  Trapezius and sternocleidomastoid strength is normal. No dysarthria is noted.  The tongue is midline, and the patient has symmetric elevation of the soft palate. No obvious hearing deficits are noted.  Motor:  Muscle bulk is normal.   Tone is normal. Strength is  5 / 5 in all 4 extremities.   Sensory: Sensory testing is intact to pinprick, soft touch and vibration sensation in all 4 extremities.  Coordination: Cerebellar testing reveals good finger-nose-finger and heel-to-shin bilaterally.  Gait and station: Station is normal.   Gait is artthritic but better. Tandem gait is wide. Romberg is negative.   Reflexes: Deep tendon reflexes are symmetric and normal bilaterally (skipped tender knees).       DIAGNOSTIC DATA (LABS, IMAGING, TESTING) - I reviewed patient records, labs, notes, testing and imaging myself where available.  Lab Results  Component Value Date   WBC 8.1 06/18/2015   HGB 12.9 06/18/2015   HCT 38.9 06/18/2015   MCV 84.0 06/18/2015   PLT 287.0 06/18/2015      Component Value Date/Time   NA 140 06/18/2015 1339   NA 138 08/18/2014 1551   K 3.1* 06/18/2015 1339   K 3.7 08/18/2014 1551   CL 103 06/18/2015 1339   CL 100 11/30/2012 1029   CO2 24 06/18/2015 1339   CO2 27 08/18/2014 1551   GLUCOSE 139* 06/18/2015 1339   GLUCOSE 117 08/18/2014 1551   GLUCOSE 91 11/30/2012 1029   BUN 22 06/18/2015 1339   BUN 17.2 08/18/2014 1551   CREATININE 1.21* 06/18/2015 1339   CREATININE 1.3* 08/18/2014 1551   CALCIUM 9.4 06/18/2015 1339   CALCIUM 9.1 08/18/2014 1551   PROT 7.5 06/18/2015 1339   PROT 6.8 10/15/2014 1420   PROT 7.1 08/18/2014 1551   ALBUMIN  4.1 06/18/2015 1339   ALBUMIN 4.5 10/15/2014 1420   ALBUMIN 4.0 08/18/2014 1551   AST 17 06/18/2015 1339   AST 19 08/18/2014 1551   ALT 23 06/18/2015 1339   ALT 21 08/18/2014 1551   ALKPHOS 241* 06/18/2015 1339   ALKPHOS 137 08/18/2014  1551   BILITOT 0.5 06/18/2015 1339   BILITOT 0.6 10/15/2014 1420   BILITOT 0.46 08/18/2014 1551   GFRNONAA 47* 12/09/2014 1110   GFRAA 55* 12/09/2014 1110       ASSESSMENT AND PLAN  Multiple sclerosis (HCC)  Other fatigue  Memory loss  High risk medication use  Depression  Restless leg syndrome    1.  Continue off MS DMT for now as liver evaluation ongoing and I don't want to add confounding factor.   Will want to restart later this year.     Since having liver issues, I'm reluctant to use Aubagio.   She had shortness of breath on Gilenya and Copaxone poorly tolerated.   She was stable on Tysabri but is JCV antibody positive.   Consider Tecfidera or Ocrelizumab (if approved) 2.   She is having more difficulty with mood, memory (focus) and sleepiness.     She is advised to retry CPAP as it will likely help her fatigue/sleepiness/attention and may help the quality of sleep.    Continue gabapentin at night. 3.   Increase Wellbutrin dose. 4.   She will return to see me in 5-6 months or sooner if she has new or worsening neurologic symptoms.  Keisi Eckford A. Felecia Shelling, MD, PhD [id number], [id number] PM Certified in Neurology, Clinical Neurophysiology, Sleep Medicine, Pain Medicine and Neuroimaging  [facility] [address] [place], [place] [address] [phone]

## 2015-07-06 NOTE — Progress Notes (Signed)
Agree with Ms. Zehr's management.  Soua Caltagirone E. Jenisa Monty, MD, FACG  

## 2015-07-06 NOTE — Progress Notes (Signed)
Quick Note:  May not be liver  Lets do  1) GGT 2) 5 prime nucleotidase 3) alk phos fractionation 4) repeat LFT's  If those point to liver then will need to consider MR/MRCP vs biopsy   FYI I do not think I have actually seen her (doesn't change anything - she is attached to me through supervising MD earlier)  ______

## 2015-07-09 ENCOUNTER — Other Ambulatory Visit: Payer: Self-pay | Admitting: *Deleted

## 2015-07-09 DIAGNOSIS — R748 Abnormal levels of other serum enzymes: Secondary | ICD-10-CM

## 2015-07-10 ENCOUNTER — Other Ambulatory Visit (INDEPENDENT_AMBULATORY_CARE_PROVIDER_SITE_OTHER): Payer: Commercial Managed Care - PPO

## 2015-07-10 DIAGNOSIS — R748 Abnormal levels of other serum enzymes: Secondary | ICD-10-CM | POA: Diagnosis not present

## 2015-07-10 LAB — HEPATIC FUNCTION PANEL
ALT: 16 U/L (ref 0–35)
AST: 17 U/L (ref 0–37)
Albumin: 4.2 g/dL (ref 3.5–5.2)
Alkaline Phosphatase: 177 U/L — ABNORMAL HIGH (ref 39–117)
Bilirubin, Direct: 0.1 mg/dL (ref 0.0–0.3)
Total Bilirubin: 0.7 mg/dL (ref 0.2–1.2)
Total Protein: 7.2 g/dL (ref 6.0–8.3)

## 2015-07-10 LAB — GAMMA GT: GGT: 75 U/L — ABNORMAL HIGH (ref 7–51)

## 2015-07-13 LAB — NUCLEOTIDASE, 5', BLOOD: 5-Nucleotidase: 4 U/L (ref 0–10)

## 2015-07-22 ENCOUNTER — Other Ambulatory Visit: Payer: Self-pay | Admitting: *Deleted

## 2015-07-22 ENCOUNTER — Other Ambulatory Visit: Payer: Commercial Managed Care - PPO

## 2015-07-22 ENCOUNTER — Telehealth: Payer: Self-pay | Admitting: *Deleted

## 2015-07-22 DIAGNOSIS — R945 Abnormal results of liver function studies: Secondary | ICD-10-CM

## 2015-07-22 DIAGNOSIS — R7989 Other specified abnormal findings of blood chemistry: Secondary | ICD-10-CM

## 2015-07-22 NOTE — Telephone Encounter (Signed)
Per Alonza Bogus, PA  Labs continue to show elevated alkaline phosphatase. Not sure why. Needs to have alkaline phosphatase isoenzymes drawn. Patient aware.

## 2015-07-23 LAB — ALKALINE PHOSPHATASE, ISOENZYMES
Alkaline Phosphatase: 177 IU/L — ABNORMAL HIGH (ref 39–117)
BONE FRACTION: 24 % (ref 14–68)
INTESTINAL FRAC.: 5 % (ref 0–18)
LIVER FRACTION: 71 % (ref 18–85)

## 2015-07-24 ENCOUNTER — Other Ambulatory Visit: Payer: Self-pay | Admitting: *Deleted

## 2015-07-24 DIAGNOSIS — R748 Abnormal levels of other serum enzymes: Secondary | ICD-10-CM

## 2015-07-24 NOTE — Progress Notes (Signed)
Quick Note:  It is not clear where this is coming from. She had some problems with a knee infection in the summer it looks like and a bone problem may be cause. I recommend she get a bone scan re: elevated alk phos and hx breast cancer next ______

## 2015-07-28 ENCOUNTER — Ambulatory Visit: Payer: Commercial Managed Care - PPO | Admitting: Neurology

## 2015-08-05 ENCOUNTER — Encounter (HOSPITAL_COMMUNITY)
Admission: RE | Admit: 2015-08-05 | Discharge: 2015-08-05 | Disposition: A | Discharge status: Home / Self Care | Payer: Commercial Managed Care - PPO | Source: Ambulatory Visit | Attending: Gastroenterology | Admitting: Gastroenterology

## 2015-08-05 DIAGNOSIS — R748 Abnormal levels of other serum enzymes: Secondary | ICD-10-CM | POA: Diagnosis not present

## 2015-08-05 MED ORDER — TECHNETIUM TC 99M MEDRONATE IV KIT
25.8000 | PACK | Freq: Once | INTRAVENOUS | Status: AC | PRN
  Administered 2015-08-05: 25.8 via INTRAVENOUS

## 2015-08-12 ENCOUNTER — Other Ambulatory Visit: Payer: Self-pay | Admitting: *Deleted

## 2015-08-12 DIAGNOSIS — R948 Abnormal results of function studies of other organs and systems: Secondary | ICD-10-CM

## 2015-08-12 NOTE — Progress Notes (Signed)
Quick Note:  I think we should order the lumbar films out of appropriate caution given her overall history though unlikely to be anything bad ______

## 2015-08-20 ENCOUNTER — Ambulatory Visit (INDEPENDENT_AMBULATORY_CARE_PROVIDER_SITE_OTHER)
Admission: RE | Admit: 2015-08-20 | Discharge: 2015-08-20 | Disposition: A | Discharge status: Home / Self Care | Payer: Commercial Managed Care - PPO | Source: Ambulatory Visit | Attending: Gastroenterology | Admitting: Gastroenterology

## 2015-08-20 DIAGNOSIS — R948 Abnormal results of function studies of other organs and systems: Secondary | ICD-10-CM

## 2015-09-02 ENCOUNTER — Encounter: Payer: Self-pay | Admitting: Neurology

## 2015-09-03 ENCOUNTER — Ambulatory Visit (INDEPENDENT_AMBULATORY_CARE_PROVIDER_SITE_OTHER): Payer: Commercial Managed Care - PPO | Admitting: Internal Medicine

## 2015-09-03 ENCOUNTER — Encounter: Payer: Self-pay | Admitting: Internal Medicine

## 2015-09-03 ENCOUNTER — Other Ambulatory Visit (INDEPENDENT_AMBULATORY_CARE_PROVIDER_SITE_OTHER): Payer: Commercial Managed Care - PPO

## 2015-09-03 ENCOUNTER — Other Ambulatory Visit: Payer: Self-pay | Admitting: *Deleted

## 2015-09-03 VITALS — BP 100/64 | HR 68 | Ht 65.5 in | Wt 216.1 lb

## 2015-09-03 DIAGNOSIS — R748 Abnormal levels of other serum enzymes: Secondary | ICD-10-CM

## 2015-09-03 LAB — HEPATIC FUNCTION PANEL
ALT: 15 U/L (ref 0–35)
AST: 15 U/L (ref 0–37)
Albumin: 4.3 g/dL (ref 3.5–5.2)
Alkaline Phosphatase: 168 U/L — ABNORMAL HIGH (ref 39–117)
Bilirubin, Direct: 0.1 mg/dL (ref 0.0–0.3)
Total Bilirubin: 0.6 mg/dL (ref 0.2–1.2)
Total Protein: 7.3 g/dL (ref 6.0–8.3)

## 2015-09-03 LAB — BASIC METABOLIC PANEL
BUN: 25 mg/dL — ABNORMAL HIGH (ref 6–23)
CO2: 28 mEq/L (ref 19–32)
Calcium: 9.6 mg/dL (ref 8.4–10.5)
Chloride: 100 mEq/L (ref 96–112)
Creatinine, Ser: 1.2 mg/dL (ref 0.40–1.20)
GFR: 49.21 mL/min — ABNORMAL LOW (ref >60.00)
Glucose, Bld: 101 mg/dL — ABNORMAL HIGH (ref 70–99)
Potassium: 3 mEq/L — ABNORMAL LOW (ref 3.5–5.1)
Sodium: 138 mEq/L (ref 135–145)

## 2015-09-03 LAB — PHOSPHORUS: Phosphorus: 4.5 mg/dL (ref 2.3–4.6)

## 2015-09-03 MED ORDER — GABAPENTIN 300 MG PO CAPS: 300.0000 mg | ORAL_CAPSULE | Freq: Two times a day (BID) | ORAL | Status: DC

## 2015-09-03 NOTE — Progress Notes (Signed)
   Subjective:    Patient ID: VERDA MEHTA, female    DOB: 06-08-1961, 55 y.o.   MRN: [id number] Cc: f/u elevated alk phos HPI No GI sxs So far elevated alkaline phosphatase w/u negative re: liver/GI She did have elevated uptake in ankles and L5 on bone scan L spine films negative has no active symptoms. Review of Systems As above    Objective:   Physical Exam BP 100/64 mmHg  Pulse 68  Ht 5' 5.5" (1.664 m)  Wt 216 lb 2 oz (98.034 kg)  BMI 35.41 kg/m2  LMP 12/05/2011 No acute distress As anicteric    Assessment & Plan:   Encounter Diagnosis  Name Primary?  . Elevated alkaline phosphatase level Yes   LFT, BMET, Ca PTH, PO4    Suspect bone issue Could be related to mild chronic kidney dysfx also

## 2015-09-03 NOTE — Patient Instructions (Signed)
Your physician has requested that you go to the basement for lab work before leaving today.  Thank you for choosing me and [facility].  Gatha Mayer, MD, Mar[facility] Regal

## 2015-09-03 NOTE — Telephone Encounter (Signed)
Gabapentin r/f per emailed request/fim

## 2015-09-03 NOTE — Progress Notes (Signed)
Quick Note:  Alk phos about the same - some tests pending but her K is low - as it was before  Am ccing her PCP about this - hopefully she is taking her K supplement but will need more if not  Her kidney function remains mildly abnormal which could have something to do with elevated alk phos  Once I get PTH and Ca levels back will notify again ______

## 2015-09-04 ENCOUNTER — Encounter: Payer: Self-pay | Admitting: Internal Medicine

## 2015-09-04 LAB — PTH, INTACT AND CALCIUM
Calcium: 9.4 mg/dL (ref 8.4–10.5)
PTH: 42 pg/mL (ref 14–64)

## 2015-09-04 NOTE — Progress Notes (Signed)
Quick Note:  Calcium and calcium metabolism are ok  Recheck alk phos in 3 months (LFT's) through PCP please - still think likely from bones - arthritis? And possibly reduced kidney fx along with that - see me prn other ?  cced Dr. Birdie Riddle   ______

## 2015-09-21 ENCOUNTER — Other Ambulatory Visit: Payer: Self-pay | Admitting: Family Medicine

## 2015-09-22 NOTE — Telephone Encounter (Signed)
Medication filled to pharmacy as requested.   

## 2015-10-07 ENCOUNTER — Encounter: Payer: Self-pay | Admitting: Neurology

## 2015-10-07 ENCOUNTER — Other Ambulatory Visit: Payer: Self-pay | Admitting: Neurology

## 2015-10-26 ENCOUNTER — Other Ambulatory Visit: Payer: Self-pay | Admitting: Family Medicine

## 2015-10-26 NOTE — Telephone Encounter (Signed)
Medication filled to pharmacy as requested.   

## 2015-11-11 ENCOUNTER — Encounter: Payer: Self-pay | Admitting: Neurology

## 2015-11-12 ENCOUNTER — Telehealth: Payer: Self-pay | Admitting: *Deleted

## 2015-11-12 ENCOUNTER — Other Ambulatory Visit: Payer: Self-pay | Admitting: *Deleted

## 2015-11-12 MED ORDER — GABAPENTIN 300 MG PO CAPS: 300.0000 mg | ORAL_CAPSULE | Freq: Two times a day (BID) | ORAL | Status: DC

## 2015-11-12 MED ORDER — BACLOFEN 10 MG PO TABS: 10.0000 mg | ORAL_TABLET | Freq: Three times a day (TID) | ORAL | Status: DC | PRN

## 2015-11-12 NOTE — Telephone Encounter (Signed)
Gabapentin and Baclofen escribed to [facility] per emailed request/fim

## 2015-11-26 ENCOUNTER — Ambulatory Visit: Payer: Commercial Managed Care - PPO | Admitting: Neurology

## 2015-12-03 ENCOUNTER — Ambulatory Visit: Payer: Commercial Managed Care - PPO | Admitting: Neurology

## 2015-12-15 ENCOUNTER — Encounter: Payer: Self-pay | Admitting: Neurology

## 2015-12-15 ENCOUNTER — Ambulatory Visit (INDEPENDENT_AMBULATORY_CARE_PROVIDER_SITE_OTHER): Payer: Commercial Managed Care - PPO | Admitting: Neurology

## 2015-12-15 ENCOUNTER — Encounter: Payer: Self-pay | Admitting: *Deleted

## 2015-12-15 VITALS — BP 110/72 | HR 74 | Resp 16 | Ht 65.5 in | Wt 216.0 lb

## 2015-12-15 DIAGNOSIS — G2581 Restless legs syndrome: Secondary | ICD-10-CM

## 2015-12-15 DIAGNOSIS — C50411 Malignant neoplasm of upper-outer quadrant of right female breast: Secondary | ICD-10-CM | POA: Diagnosis not present

## 2015-12-15 DIAGNOSIS — G35 Multiple sclerosis: Secondary | ICD-10-CM

## 2015-12-15 DIAGNOSIS — G4733 Obstructive sleep apnea (adult) (pediatric): Secondary | ICD-10-CM | POA: Diagnosis not present

## 2015-12-15 DIAGNOSIS — R5383 Other fatigue: Secondary | ICD-10-CM | POA: Diagnosis not present

## 2015-12-15 DIAGNOSIS — F329 Major depressive disorder, single episode, unspecified: Secondary | ICD-10-CM | POA: Diagnosis not present

## 2015-12-15 DIAGNOSIS — F32A Depression, unspecified: Secondary | ICD-10-CM

## 2015-12-15 MED ORDER — PHENTERMINE HCL 37.5 MG PO CAPS: 37.5000 mg | ORAL_CAPSULE | ORAL | Status: DC

## 2015-12-15 MED ORDER — ALPRAZOLAM 0.5 MG PO TABS: 0.5000 mg | ORAL_TABLET | Freq: Three times a day (TID) | ORAL | Status: DC | PRN

## 2015-12-15 NOTE — Progress Notes (Signed)
[facility]  PATIENT: Julie Ross DOB: [birth year]  REFERRING DOCTOR OR PCP:  Annye Asa SOURCE: patient and records from [facility]  _________________________________   HISTORICAL  CHIEF COMPLAINT:  Chief Complaint  Patient presents with  . Multiple Sclerosis    She remains off of MS med.  Sts. ortho still would prefer she not be on one, given hx. of sepsis, following left knee replacement.  She requests r/f of Alpraxolam today/fim    HISTORY OF PRESENT ILLNESS:  Julie Ross is a 55 year old woman with multiple sclerosis.   She is currently off a disease modifying therapy. Aubagio was prescribed but she decided not to take it and reports her orthopedic surgeon refers her to not be on any immunomodulatory agent at this time due to her septic joint in the past. She will most likely have another operation on the left knee.  Mood/cognitive:    She notes depression and gets angry and irritable easily.  She has some anxiety.  She has added stress taking care of her mother.   She is apathetic.       She notes reduced focus and attention and sometime mild memory issues.     Fatigue/sleep:   She notes fatigue daily, both physical and mental.   Fatigue is present when she wakes up and builds up some.     She has severe obstructive sleep apnea (AHI=33) that was diagnosed on 11/20/2013.  She was placed on Auto-PAP but has not used it much (none in last few months).      She also has moderate RLS with some impact on sleep, helped by nighttime gabapentin 300 mg and baclofen.     She also takes xanax at bedtime.      During the day she is very sleepy but then has trouble falling asleep at night.      She tried Provigil but it made her jittery.    She has never tried a stimulant  Gait/strength/sensation:   Gait issues are mild since knee surgery.   Her legs fatigue easily.   She occasioanally trips over her feet but has not fallen.  She denies any leg  numbness.  Vision:   She had left optic neuritis in 2012. Visual acuity improved but not to baseline.   Colors are mildly desaturated, esp when tired  Bladder:   Bladder function is fine.    She has had multiple infections in the left knee during chemotherapy.   She has had bilateral TKR.   Due to infections and hardware infection, she has had 4 operations on the left.    Her orthopedics prefers not to be on any medication that might affect the immune system  MS History:    She was diagnosed with MS in 1999 after presenting with optic neuritis.   She had a spinal tap, MRI and angiogram.   Initially, the diagnosis was uncertain but she had changes in the MRI over time leading to a diagnosis of MS a few years later.    Initially, she was placed on Copaxone, then switched to Avonex due to skin reactions.   She then switched to Tysabri when she had some mild breakthrough disease. She tolerated Tysabri very well and her MS did well.  However, she was JCV antibody positive so she stopped in 2012.   She did not restart Avonex as she did not want to go back to an injection.    Her last exacerbation was optic neuritis  in 2012 treated with steroids. She proved but did not get back to baseline.  This exacerbation occurred off any DMT.  She tried Gilenya in 2013. She had trouble tolerating Gilenya and discontinued shortly after starting it. She was going to start Tecfidera but was diagnosed with breast cancer in 2013 and opted not to start at that time. She received chemotherapy including Taxotere, carboplatin, Herceptin in 2013 and 2014. An MRI of the brain in 2015 showed only mild MS progression compared to her previous one in 2011.      REVIEW OF SYSTEMS: Constitutional: No fevers, chills, sweats, or change in appetite.  Notes fatigue Eyes: No visual changes, double vision, eye pain Ear, nose and throat: No hearing loss, ear pain, nasal congestion, sore throat.  Recent URI Cardiovascular: No chest pain,  palpitations Respiratory: No shortness of breath at rest or with exertion.   No wheezes.  Has OSA GastrointestinaI: No nausea, vomiting, diarrhea, abdominal pain, fecal incontinence Genitourinary: No dysuria, urinary retention or frequency.  She has 1 times nocturia most nights Musculoskeletal: No neck pain, back pain.  She has bilateral knee pain Integumentary: No rash, pruritus, skin lesions Neurological: as above Psychiatric: No depression at this time.  Some anxiety Endocrine: No palpitations, diaphoresis, change in appetite, change in weigh or increased thirst Hematologic/Lymphatic: No anemia, purpura, petechiae. Allergic/Immunologic: No itchy/runny eyes, nasal congestion, recent allergic reactions, rashes  ALLERGIES: Allergies  Allergen Reactions  . Bee Venom Anaphylaxis    HOME MEDICATIONS:  Current outpatient prescriptions:  .  ALPRAZolam (XANAX) 0.5 MG tablet, TAKE 1 TABLET BY MOUTH THREE TIMES DAILY AS NEEDED FOR ANXIETY, Disp: 90 tablet, Rfl: 0 .  amLODipine (NORVASC) 10 MG tablet, TAKE 1 TABLET DAILY, Disp: 90 tablet, Rfl: 1 .  atenolol (TENORMIN) 25 MG tablet, TK 1/2 T PO D B BRE FOR BP, Disp: 90 tablet, Rfl: 1 .  atorvastatin (LIPITOR) 20 MG tablet, TAKE 1 TABLET DAILY, Disp: 90 tablet, Rfl: 0 .  baclofen (LIORESAL) 10 MG tablet, Take 1 tablet (10 mg total) by mouth 3 (three) times daily as needed for muscle spasms., Disp: 270 each, Rfl: 3 .  buPROPion (WELLBUTRIN XL) 150 MG 24 hr tablet, TAKE 1 TABLET DAILY, Disp: 90 tablet, Rfl: 1 .  CALCIUM-MAGNESIUM PO, Take 1 tablet by mouth daily., Disp: , Rfl:  .  docusate sodium (COLACE) 50 MG capsule, Take 100 mg by mouth at bedtime., Disp: , Rfl:  .  EPINEPHrine 0.3 mg/0.3 mL IJ SOAJ injection, Inject 0.3 mLs (0.3 mg total) into the muscle once., Disp: 2 Device, Rfl: 2 .  gabapentin (NEURONTIN) 300 MG capsule, Take 1 capsule (300 mg total) by mouth 2 (two) times daily., Disp: 180 capsule, Rfl: 3 .  HYDROcodone-acetaminophen  (NORCO/VICODIN) 5-325 MG per tablet, Take 1 tablet by mouth daily as needed for moderate pain or severe pain. , Disp: , Rfl:  .  ibuprofen (ADVIL,MOTRIN) 600 MG tablet, , Disp: , Rfl:  .  letrozole (FEMARA) 2.5 MG tablet, Take 1 tablet (2.5 mg total) by mouth daily., Disp: 90 tablet, Rfl: 3 .  letrozole (FEMARA) 2.5 MG tablet, Take 2.5 mg by mouth daily., Disp: , Rfl:  .  levothyroxine (SYNTHROID, LEVOTHROID) 75 MCG tablet, TAKE 1 TABLET DAILY, Disp: 90 tablet, Rfl: 1 .  losartan-hydrochlorothiazide (HYZAAR) 100-25 MG tablet, TAKE 1 TABLET EVERY MORNING BEFORE BREAKFAST, Disp: 90 tablet, Rfl: 0 .  Multiple Vitamins-Minerals (MULTIVITAMINS THER. W/MINERALS) TABS, Take 1 tablet by mouth daily., Disp: , Rfl:  .  omeprazole (PRILOSEC) 40 MG capsule, TAKE 1 CAPSULE DAILY, Disp: 90 capsule, Rfl: 1 .  potassium chloride SA (K-DUR,KLOR-CON) 20 MEQ tablet, Take 1 tablet (20 mEq total) by mouth daily., Disp: 30 tablet, Rfl: 6 .  Probiotic Product (PROBIOTIC DAILY PO), Take 1 capsule by mouth daily. , Disp: , Rfl:  .  venlafaxine XR (EFFEXOR-XR) 150 MG 24 hr capsule, Take 1 capsule (150 mg total) by mouth daily with breakfast., Disp: 90 capsule, Rfl: 1 .  vitamin E 400 UNIT capsule, Take 800 Units by mouth daily., Disp: , Rfl:   PAST MEDICAL HISTORY: Past Medical History  Diagnosis Date  . Hypertension   . Hypothyroidism   . Neuromuscular disorder ([place])     MS TX WITH CYMBALTA   . Multiple sclerosis ([place])   . H/O colonoscopy   . H/O bone density study 03/2011  . Wears glasses   . Heart burn   . Depression   . Hx of radiation therapy 04/17/12- 06/04/12    left chest wall, axilla, L supraclavicular fossa 4500 cGy, left mastectomy scar/chest wall 5940 cGy  . H/O echocardiogram     last one 02/2013, due to chemotherapy  . Sleep apnea     MILD SLEEP APNEA, NO (Needed) MACHINE  6 YRS AGO HPT [facility]   . GERD (gastroesophageal reflux disease)   . Breast cancer ([place]) 11/2011    lul/ER+PR+, x1 lymph  node   . Cancer (HCC)     squamous cell skin cancer x7  . Arthritis     knees   . Anemia     h/o - ? bld. transfusion - more recent- ?2013  . Anxiety     uses xanax mainly for sleep    PAST SURGICAL HISTORY: Past Surgical History  Procedure Laterality Date  . Tonsillectomy    . Tumor removal      FROM PELVIS AGE 79  . Cesarean section      X2   . No past surgeries      BLADDER SLING  4-5 YRS AGO   . Knee arthroscopy      LT KNEE 04/2011  . Total knee arthroplasty  08/15/2011    Procedure: TOTAL KNEE ARTHROPLASTY; lft Surgeon: Alta Corning, MD;  Location: [facility];  Service: Orthopedics;  Laterality: Left;  RIGHT KNEE CORTIZONE INJECTION  . Mohs surgery      right face  . Knee arthroscopy  82    rt  . Mastectomy w/ sentinel node biopsy  12/19/2011    Procedure: MASTECTOMY WITH SENTINEL LYMPH NODE BIOPSY;  Surgeon: Haywood Lasso, MD;  Location: [facility];  Service: General;  Laterality: Left;  left breast and left axilla   . Portacath placement  01/16/2012    Procedure: INSERTION PORT-A-CATH;  Surgeon: Haywood Lasso, MD;  Location: [place];  Service: General;  Laterality: Right;  [place] Cath Placement   . I&d knee with poly exchange  03/02/2012    Procedure: IRRIGATION AND DEBRIDEMENT KNEE WITH POLY EXCHANGE;  Surgeon: Alta Corning, MD;  Location: [facility];  Service: Orthopedics;  Laterality: Left;  I&D of total knee with Possible Poly Exchange   . Tee without cardioversion  03/06/2012    Procedure: TRANSESOPHAGEAL ECHOCARDIOGRAM (TEE);  Surgeon: Josue Hector, MD;  Location: [facility];  Service: Cardiovascular;  Laterality: N/A;  Rm. 2927  . Port-a-cath removal  03/06/2012    Procedure: REMOVAL PORT-A-CATH;  Surgeon: Odis Hollingshead, MD;  Location: [facility];  Service: General;  Laterality: N/A;  . Portacath placement  05/01/2012    Procedure: INSERTION PORT-A-CATH;  Surgeon: Haywood Lasso, MD;  Location: [facility];  Service: General;   Laterality: Right;  right internal jugular port-a-cath insertion  . Total knee arthroplasty Left 08/18/2012    Procedure:  Irrigation and debridement of LEFT total knee;  Removal of total knee parts; Implant of spacers;  Surgeon: Kerin Salen, MD;  Location: [facility];  Service: Orthopedics;  Laterality: Left;  . Port-a-cath removal Right 08/22/2012    Procedure: REMOVAL PORT-A-CATH;  Surgeon: Edward Jolly, MD;  Location: [place];  Service: General;  Laterality: Right;  . Joint replacement  Feb 2013    left knee- multiple surgeries   . Mastectomy    . Total knee revision Left 04/19/2013    Procedure: TOTAL KNEE REVISION AND REMOVAL CEMENT SPACER;  Surgeon: Kerin Salen, MD;  Location: [facility];  Service: Orthopedics;  Laterality: Left;  . Total knee arthroplasty Right 08/12/2013  . Total knee arthroplasty Right 08/12/2013    Procedure: RIGHT TOTAL KNEE ARTHROPLASTY;  Surgeon: Kerin Salen, MD;  Location: [facility] [place];  Service: Orthopedics;  Laterality: Right;  . Esophagogastroduodenoscopy    . Colonoscopy    . Flexible sigmoidoscopy      FAMILY HISTORY: Family History  Problem Relation Age of Onset  . Breast cancer Paternal Aunt     dx in her 35s  . Heart disease Maternal Grandfather   . Lung cancer Maternal Uncle     smoker  . Breast cancer Paternal Grandmother     dx in her 38s  . Ovarian cancer Paternal Aunt     dx in her 62s  . Arthritis Mother   . Hypertension Mother   . Heart disease Mother     CHF  . Dementia Father   . Diabetes type II Father     SOCIAL HISTORY:  Social History   Social History  . Marital Status: Married    Spouse Name: N/A  . Number of Children: 2  . Years of Education: N/A   Occupational History  .     Social History Main Topics  . Smoking status: Never Smoker   . Smokeless tobacco: Never Used  . Alcohol Use: No  . Drug Use: No  . Sexual Activity: Yes   Other Topics Concern  . Not on file   Social History Narrative     PHYSICAL  EXAM  Filed Vitals:   12/15/15 1507  BP: 110/72  Pulse: 74  Resp: 16  Height: 5' 5.5" (1.664 m)  Weight: 216 lb (97.977 kg)    Body mass index is 35.38 kg/(m^2).   General: The patient is well-developed and well-nourished and in no acute distress   Neurologic Exam  Mental status: The patient is alert and oriented x 3 at the time of the examination. The patient has apparent normal recent and remote memory, with an apparently normal attention span and concentration ability.   Speech is normal.  Cranial nerves: Extraocular movements are full.   There is good facial sensation to soft touch bilaterally.Facial strength is normal.  Trapezius and sternocleidomastoid strength is normal. No dysarthria is noted.  The tongue is midline, and the patient has symmetric elevation of the soft palate. No obvious hearing deficits are noted.  Motor:  Muscle bulk is normal.   Tone is normal. Strength is  5 / 5 in all 4 extremities.   Sensory: Sensory  testing is intact to pinprick, soft touch and vibration sensation in all 4 extremities.  Coordination: Cerebellar testing reveals good finger-nose-finger and heel-to-shin bilaterally.  Gait and station: Station is normal.   Gait is artthritic but better. Tandem gait is wide. Romberg is negative.   Reflexes: Deep tendon reflexes are symmetric and normal bilaterally (skipped tender knees).       DIAGNOSTIC DATA (LABS, IMAGING, TESTING) - I reviewed patient records, labs, notes, testing and imaging myself where available.  Lab Results  Component Value Date   WBC 8.1 06/18/2015   HGB 12.9 06/18/2015   HCT 38.9 06/18/2015   MCV 84.0 06/18/2015   PLT 287.0 06/18/2015      Component Value Date/Time   NA 138 09/03/2015 0956   NA 138 08/18/2014 1551   K 3.0* 09/03/2015 0956   K 3.7 08/18/2014 1551   CL 100 09/03/2015 0956   CL 100 11/30/2012 1029   CO2 28 09/03/2015 0956   CO2 27 08/18/2014 1551   GLUCOSE 101* 09/03/2015 0956   GLUCOSE 117  08/18/2014 1551   GLUCOSE 91 11/30/2012 1029   BUN 25* 09/03/2015 0956   BUN 17.2 08/18/2014 1551   CREATININE 1.20 09/03/2015 0956   CREATININE 1.3* 08/18/2014 1551   CALCIUM 9.6 09/03/2015 0956   CALCIUM 9.4 09/03/2015 0956   CALCIUM 9.1 08/18/2014 1551   PROT 7.3 09/03/2015 0956   PROT 6.8 10/15/2014 1420   PROT 7.1 08/18/2014 1551   ALBUMIN 4.3 09/03/2015 0956   ALBUMIN 4.5 10/15/2014 1420   ALBUMIN 4.0 08/18/2014 1551   AST 15 09/03/2015 0956   AST 19 08/18/2014 1551   ALT 15 09/03/2015 0956   ALT 21 08/18/2014 1551   ALKPHOS 168* 09/03/2015 0956   ALKPHOS 137 08/18/2014 1551   BILITOT 0.6 09/03/2015 0956   BILITOT 0.6 10/15/2014 1420   BILITOT 0.46 08/18/2014 1551   GFRNONAA 47* 12/09/2014 1110   GFRAA 55* 12/09/2014 1110       ASSESSMENT AND PLAN  Multiple sclerosis (HCC)  Obstructive sleep apnea  Breast cancer of upper-outer quadrant of right female breast (HCC)  Depression  Other fatigue  Restless leg syndrome    1.  Continue off MS DMT for now as orthopedics prefer her not to go back on.  Check MRI of the brain to assess stability.   Consider Tecfidera or Aubagio if she has breakthrough.   2.   She is having more difficulty with mood, memory (focus) and sleepiness.     She is advised to retry CPAP as it will likely help her fatigue/sleepiness/attention and may help the quality of sleep.    Continue gabapentin at night. 3.  Renew Alprazolam 4.   Phentermine to help MS fatigue and weight loss. 5.  Advised to use CPAP nightly She will return to see me in 6 months or sooner if she has new or worsening neurologic symptoms.  Patton Rabinovich A. Felecia Shelling, MD, PhD [id number], [id number] PM Certified in Neurology, Clinical Neurophysiology, Sleep Medicine, Pain Medicine and Neuroimaging  [facility] [address], [place], [place] [address] [phone]

## 2015-12-18 ENCOUNTER — Encounter: Payer: Self-pay | Admitting: Family Medicine

## 2015-12-18 ENCOUNTER — Ambulatory Visit (INDEPENDENT_AMBULATORY_CARE_PROVIDER_SITE_OTHER): Payer: Commercial Managed Care - PPO | Admitting: Family Medicine

## 2015-12-18 VITALS — BP 108/72 | HR 83 | Temp 98.0°F | Resp 18 | Ht 66.0 in | Wt 214.4 lb

## 2015-12-18 DIAGNOSIS — Z0000 Encounter for general adult medical examination without abnormal findings: Secondary | ICD-10-CM | POA: Diagnosis not present

## 2015-12-18 DIAGNOSIS — L989 Disorder of the skin and subcutaneous tissue, unspecified: Secondary | ICD-10-CM

## 2015-12-18 NOTE — Progress Notes (Signed)
   Subjective:    Patient ID: Julie Ross, female    DOB: 1960-08-13, 55 y.o.   MRN: [id number]  HPI CPE- UTD on pap, mammo, colonoscopy.  UTD on Tdap   Review of Systems Patient reports no vision/ hearing changes, adenopathy,fever, weight change,  persistant/recurrent hoarseness , swallowing issues, chest pain, palpitations, edema, persistant/recurrent cough, hemoptysis, dyspnea (rest/exertional/paroxysmal nocturnal), gastrointestinal bleeding (melena, rectal bleeding), abdominal pain, significant heartburn, bowel changes, GU symptoms (dysuria, hematuria, incontinence), Gyn symptoms (abnormal  bleeding, pain),  syncope, focal weakness, memory loss, numbness & tingling, skin/hair/nail changes, abnormal bruising or bleeding, anxiety, or depression.     Objective:   Physical Exam General Appearance:    Alert, cooperative, no distress, appears stated age  Head:    Normocephalic, without obvious abnormality, atraumatic  Eyes:    PERRL, conjunctiva/corneas clear, EOM's intact, fundi    benign, both eyes  Ears:    Normal TM's and external ear canals, both ears  Nose:   Nares normal, septum midline, mucosa normal, no drainage    or sinus tenderness  Throat:   Lips, mucosa, and tongue normal; teeth and gums normal  Neck:   Supple, symmetrical, trachea midline, no adenopathy;    Thyroid: no enlargement/tenderness/nodules  Back:     Symmetric, no curvature, ROM normal, no CVA tenderness  Lungs:     Clear to auscultation bilaterally, respirations unlabored  Chest Wall:    No tenderness or deformity   Heart:    Regular rate and rhythm, S1 and S2 normal, no murmur, rub   or gallop  Breast Exam:    Deferred to GYN  Abdomen:     Soft, non-tender, bowel sounds active all four quadrants,    no masses, no organomegaly  Genitalia:    Deferred to GYN  Rectal:    Extremities:   Extremities normal, atraumatic, no cyanosis or edema  Pulses:   2+ and symmetric all extremities  Skin:   Skin color,  texture, turgor normal, no rashes.  Crusting on pink base on R forearm  Lymph nodes:   Cervical, supraclavicular, and axillary nodes normal  Neurologic:   CNII-XII intact, normal strength, sensation and reflexes    throughout          Assessment & Plan:

## 2015-12-18 NOTE — Progress Notes (Signed)
Pre visit review using our clinic review tool, if applicable. No additional management support is needed unless otherwise documented below in the visit note. 

## 2015-12-18 NOTE — Patient Instructions (Signed)
Schedule a fasting lab appt at the [facility] office for your convenience next week Follow up w/ me in 6 months to recheck BP and cholesterol We'll notify you of your lab results and make any changes if needed Continue to work on healthy diet- but eating regularly- and regular exercise Call with any questions or concerns Happy 4th!!

## 2015-12-18 NOTE — Assessment & Plan Note (Signed)
Pt's PE WNL w/ exception of being overweight and skin lesion on R forearm.  UTD on colonoscopy, mammo, pap.  Check labs.  Anticipatory guidance provided.

## 2015-12-23 ENCOUNTER — Encounter: Payer: Self-pay | Admitting: Family Medicine

## 2015-12-24 ENCOUNTER — Other Ambulatory Visit (INDEPENDENT_AMBULATORY_CARE_PROVIDER_SITE_OTHER): Payer: Commercial Managed Care - PPO

## 2015-12-24 DIAGNOSIS — Z0000 Encounter for general adult medical examination without abnormal findings: Secondary | ICD-10-CM

## 2015-12-24 LAB — CBC WITH DIFFERENTIAL/PLATELET
Basophils Absolute: 0.1 10*3/uL (ref 0.0–0.1)
Basophils Relative: 0.8 % (ref 0.0–3.0)
Eosinophils Absolute: 0.2 10*3/uL (ref 0.0–0.7)
Eosinophils Relative: 2.3 % (ref 0.0–5.0)
HCT: 36.9 % (ref 36.0–46.0)
Hemoglobin: 12.5 g/dL (ref 12.0–15.0)
Lymphocytes Relative: 30.7 % (ref 12.0–46.0)
Lymphs Abs: 2.2 10*3/uL (ref 0.7–4.0)
MCHC: 33.8 g/dL (ref 30.0–36.0)
MCV: 82.7 fl (ref 78.0–100.0)
Monocytes Absolute: 0.3 10*3/uL (ref 0.1–1.0)
Monocytes Relative: 4.4 % (ref 3.0–12.0)
Neutro Abs: 4.4 10*3/uL (ref 1.4–7.7)
Neutrophils Relative %: 61.8 % (ref 43.0–77.0)
Platelets: 247 10*3/uL (ref 150.0–400.0)
RBC: 4.46 Mil/uL (ref 3.87–5.11)
RDW: 13.6 % (ref 11.5–15.5)
WBC: 7.1 10*3/uL (ref 4.0–10.5)

## 2015-12-24 LAB — LIPID PANEL
Cholesterol: 172 mg/dL (ref 0–200)
HDL: 57.5 mg/dL (ref >39.00)
LDL Cholesterol: 81 mg/dL (ref 0–99)
NonHDL: 114.52
Total CHOL/HDL Ratio: 3
Triglycerides: 170 mg/dL — ABNORMAL HIGH (ref 0.0–149.0)
VLDL: 34 mg/dL (ref 0.0–40.0)

## 2015-12-24 LAB — BASIC METABOLIC PANEL
BUN: 22 mg/dL (ref 6–23)
CO2: 27 mEq/L (ref 19–32)
Calcium: 9.7 mg/dL (ref 8.4–10.5)
Chloride: 100 mEq/L (ref 96–112)
Creatinine, Ser: 1.29 mg/dL — ABNORMAL HIGH (ref 0.40–1.20)
GFR: 45.22 mL/min — ABNORMAL LOW (ref >60.00)
Glucose, Bld: 128 mg/dL — ABNORMAL HIGH (ref 70–99)
Potassium: 3.1 mEq/L — ABNORMAL LOW (ref 3.5–5.1)
Sodium: 137 mEq/L (ref 135–145)

## 2015-12-24 LAB — HEPATIC FUNCTION PANEL
ALT: 16 U/L (ref 0–35)
AST: 18 U/L (ref 0–37)
Albumin: 4.2 g/dL (ref 3.5–5.2)
Alkaline Phosphatase: 145 U/L — ABNORMAL HIGH (ref 39–117)
Bilirubin, Direct: 0.1 mg/dL (ref 0.0–0.3)
Total Bilirubin: 0.6 mg/dL (ref 0.2–1.2)
Total Protein: 7 g/dL (ref 6.0–8.3)

## 2015-12-24 LAB — VITAMIN D 25 HYDROXY (VIT D DEFICIENCY, FRACTURES): VITD: 42.24 ng/mL (ref 30.00–100.00)

## 2015-12-24 LAB — TSH: TSH: 4.67 u[IU]/mL — ABNORMAL HIGH (ref 0.35–4.50)

## 2015-12-28 ENCOUNTER — Other Ambulatory Visit (INDEPENDENT_AMBULATORY_CARE_PROVIDER_SITE_OTHER): Payer: Commercial Managed Care - PPO

## 2015-12-28 ENCOUNTER — Other Ambulatory Visit: Payer: Self-pay | Admitting: General Practice

## 2015-12-28 DIAGNOSIS — R7309 Other abnormal glucose: Secondary | ICD-10-CM

## 2015-12-28 DIAGNOSIS — E038 Other specified hypothyroidism: Secondary | ICD-10-CM

## 2015-12-28 DIAGNOSIS — E876 Hypokalemia: Secondary | ICD-10-CM

## 2015-12-28 LAB — HEMOGLOBIN A1C: Hgb A1c MFr Bld: 6.1 % (ref 4.6–6.5)

## 2015-12-28 MED ORDER — POTASSIUM CHLORIDE CRYS ER 20 MEQ PO TBCR: 20.0000 meq | EXTENDED_RELEASE_TABLET | Freq: Every day | ORAL | Status: DC

## 2015-12-28 MED ORDER — LEVOTHYROXINE SODIUM 88 MCG PO TABS: 88.0000 ug | ORAL_TABLET | Freq: Every day | ORAL | Status: DC

## 2015-12-31 ENCOUNTER — Encounter: Payer: Self-pay | Admitting: Neurology

## 2016-01-03 ENCOUNTER — Ambulatory Visit
Admission: RE | Admit: 2016-01-03 | Discharge: 2016-01-03 | Disposition: A | Discharge status: Home / Self Care | Payer: Commercial Managed Care - PPO | Source: Ambulatory Visit | Attending: Neurology | Admitting: Neurology

## 2016-01-03 DIAGNOSIS — R5383 Other fatigue: Secondary | ICD-10-CM | POA: Diagnosis not present

## 2016-01-03 DIAGNOSIS — G35 Multiple sclerosis: Secondary | ICD-10-CM | POA: Diagnosis not present

## 2016-01-03 MED ORDER — GADOBENATE DIMEGLUMINE 529 MG/ML IV SOLN
10.0000 mL | Freq: Once | INTRAVENOUS | Status: AC | PRN
  Administered 2016-01-03: 10 mL via INTRAVENOUS

## 2016-01-06 ENCOUNTER — Telehealth: Payer: Self-pay | Admitting: Neurology

## 2016-01-06 MED ORDER — PHENTERMINE HCL 37.5 MG PO CAPS: 37.5000 mg | ORAL_CAPSULE | ORAL | Status: DC

## 2016-01-06 NOTE — Addendum Note (Signed)
Addended by: France Ravens I on: 01/06/2016 04:56 PM   Modules accepted: Orders

## 2016-01-06 NOTE — Telephone Encounter (Signed)
-----   Message from Britt Bottom, MD sent at 01/03/2016  2:42 PM EDT ----- Please let her know that the MRI of the brain is stable when compared to the one from April 2015. There are no new MS plaques or anything worrisome with her history of breast cancer.

## 2016-01-06 NOTE — Telephone Encounter (Signed)
I have spoken with Julie Ross this afternoon, and per RAS, advised that, when compared to her 2015 MRI, her most recent MRI brain is stable.  No new MS lesions, and nothing worrisome with her hx. of breast ca.  Julie Ross verbalized understanding of same/fim

## 2016-01-06 NOTE — Telephone Encounter (Signed)
Pt called and states [facility] will not fill rx phentermine 37.5 MG capsule through mail order. Pt also would like results for her MRI. Please call and advise [phone]

## 2016-01-06 NOTE — Telephone Encounter (Signed)
Phentermine rx. awaiting RAS sig/fim

## 2016-01-07 NOTE — Telephone Encounter (Signed)
Phentermine rx. faxed to [facility]/fim

## 2016-01-19 ENCOUNTER — Other Ambulatory Visit: Payer: Self-pay | Admitting: Family Medicine

## 2016-01-19 ENCOUNTER — Other Ambulatory Visit: Payer: Self-pay | Admitting: Dermatology

## 2016-02-29 LAB — HM MAMMOGRAPHY: HM Mammogram: NORMAL (ref 0–4)

## 2016-02-29 LAB — HM PAP SMEAR

## 2016-03-02 ENCOUNTER — Other Ambulatory Visit: Payer: Self-pay | Admitting: Family Medicine

## 2016-03-08 ENCOUNTER — Other Ambulatory Visit: Payer: Self-pay | Admitting: Hematology and Oncology

## 2016-03-10 ENCOUNTER — Other Ambulatory Visit: Payer: Self-pay

## 2016-03-10 ENCOUNTER — Other Ambulatory Visit: Payer: Self-pay | Admitting: Dermatology

## 2016-03-10 NOTE — Telephone Encounter (Signed)
Chart reviewed.

## 2016-04-19 ENCOUNTER — Telehealth: Payer: Self-pay | Admitting: Family Medicine

## 2016-04-19 NOTE — Telephone Encounter (Signed)
Pt states that she needs refill on atenolol and that she is out, pt asking for this to be rushed, [facility].

## 2016-04-20 MED ORDER — ATENOLOL 25 MG PO TABS: ORAL_TABLET | ORAL | 1 refills | Status: DC

## 2016-04-20 NOTE — Telephone Encounter (Signed)
Medication filled to pharmacy as requested.   

## 2016-05-18 ENCOUNTER — Other Ambulatory Visit: Payer: Self-pay | Admitting: Neurology

## 2016-05-23 ENCOUNTER — Telehealth: Payer: Self-pay | Admitting: Family Medicine

## 2016-05-23 MED ORDER — VENLAFAXINE HCL ER 150 MG PO CP24: 150.0000 mg | ORAL_CAPSULE | Freq: Every day | ORAL | 1 refills | Status: DC

## 2016-05-23 NOTE — Telephone Encounter (Signed)
Pt asking for a refill on venlafaxine be sent to [facility] on [address] for a 30 day supply, pt states that she has an upcoming appt on 12/29.

## 2016-05-23 NOTE — Telephone Encounter (Signed)
Medication filled to pharmacy as requested.   

## 2016-06-08 ENCOUNTER — Other Ambulatory Visit: Payer: Self-pay | Admitting: Family Medicine

## 2016-06-08 NOTE — Assessment & Plan Note (Signed)
Left breast invasive ductal carcinoma with DCIS ER/PR positive HER-2 positive: Status post mastectomy, adjuvant chemotherapy in 1 year of Herceptin. Currently on letrozole adjuvant hormonal therapy started 03/13/13 .   Letrozole toxicities: 1. No major hot flashes or myalgias.  Knee infection: patient has had multiple problems with her Left knee And it was recently cleaned and repaired there she continues to have some achiness and pain in the knee.  Breast cancer surveillance: 1. Breast exam done 04/27/2015  2. Mammogram 02/06/2014   Multiple sclerosis: Causing fatigue and body pains. Patient was offered a treatment for MS but she does not want to take it because one of the Adverse effects was neutropenia. Because of her knee infection , she does not want to take any medication that would lower her white blood cell count.  Anxiety/depression: On Wellbutrin and Cymbalta. Hypokalemia : previously potassium is 3.1 in August 2015. I would like to recheck her potassium levels today. We also like to check her CBC in order to see if she is anemic because patient feels fatigued.  Return to clinic in 6 months for follow-up

## 2016-06-09 ENCOUNTER — Encounter: Payer: Self-pay | Admitting: Hematology and Oncology

## 2016-06-09 ENCOUNTER — Ambulatory Visit (HOSPITAL_BASED_OUTPATIENT_CLINIC_OR_DEPARTMENT_OTHER): Payer: Commercial Managed Care - PPO | Admitting: Hematology and Oncology

## 2016-06-09 DIAGNOSIS — Z170 Estrogen receptor positive status [ER+]: Secondary | ICD-10-CM | POA: Diagnosis not present

## 2016-06-09 DIAGNOSIS — F418 Other specified anxiety disorders: Secondary | ICD-10-CM

## 2016-06-09 DIAGNOSIS — Z79811 Long term (current) use of aromatase inhibitors: Secondary | ICD-10-CM

## 2016-06-09 DIAGNOSIS — G35 Multiple sclerosis: Secondary | ICD-10-CM | POA: Diagnosis not present

## 2016-06-09 DIAGNOSIS — C50411 Malignant neoplasm of upper-outer quadrant of right female breast: Secondary | ICD-10-CM | POA: Diagnosis not present

## 2016-06-09 NOTE — Progress Notes (Signed)
Patient Care Team: Midge Minium, MD as PCP - General (Family Medicine) Juanda Chance, NP as Nurse Practitioner (Obstetrics and Gynecology) Gatha Mayer, MD as Consulting Physician (Gastroenterology)  DIAGNOSIS:  Encounter Diagnosis  Name Primary?  . Malignant neoplasm of upper-outer quadrant of right breast in female, estrogen receptor positive ([place])     SUMMARY OF ONCOLOGIC HISTORY:   Breast cancer of upper-outer quadrant of right female breast ([place])   11/22/2011 Initial Diagnosis    Cancer of upper-outer quadrant of female breast      12/19/2011 Surgery    Left mastectomy with SLN biopsy 2 foci of invasive ductal carcinoma 0.8 and 0.6 cm low-grade ER PR positive HER-2 positive ratio 3.27 Ki-67 11% one lymph node positive out of 4 (T1, N1, M0 stage II)      02/16/2012 - 04/14/2013 Chemotherapy    Taxotere, carboplatin, Herceptin x2 cycles complicated by bacteremia and left knee sepsis requiring revision of the knee. Herceptin maintenance every 3 weeks. Herceptin maintenance completed 04/14/2013      04/17/2012 - 06/04/2012 Radiation Therapy    Radiation therapy to the breast      03/13/2013 -  Anti-estrogen oral therapy    Letrozole 2.5 mg once daily       CHIEF COMPLIANT: Follow-up on letrozole therapy  INTERVAL HISTORY: KATRENA STEHLIN is a 55 year old with above-mentioned history left breast cancer treated with mastectomy followed by adjuvant chemotherapy followed by radiation and is currently on adjuvant letrozole. She is tolerating letrozole fairly well. She has had multiple problems with knees which are doing much better. Denies any lumps or nodules in the breasts.  REVIEW OF SYSTEMS:   Constitutional: Denies fevers, chills or abnormal weight loss Eyes: Denies blurriness of vision Ears, nose, mouth, throat, and face: Denies mucositis or sore throat Respiratory: Denies cough, dyspnea or wheezes Cardiovascular: Denies palpitation, chest  discomfort Gastrointestinal:  Denies nausea, heartburn or change in bowel habits Skin: Denies abnormal skin rashes Lymphatics: Denies new lymphadenopathy or easy bruising Neurological:Denies numbness, tingling or new weaknesses Behavioral/Psych: Mood is stable, no new changes  Extremities: No lower extremity edema Breast:  denies any pain or lumps or nodules in either breasts All other systems were reviewed with the patient and are negative.  I have reviewed the past medical history, past surgical history, social history and family history with the patient and they are unchanged from previous note.  ALLERGIES:  is allergic to bee venom.  MEDICATIONS:  Current Outpatient Prescriptions  Medication Sig Dispense Refill  . ALPRAZolam (XANAX) 0.5 MG tablet Take 1 tablet (0.5 mg total) by mouth 3 (three) times daily as needed. for anxiety 270 tablet 1  . amLODipine (NORVASC) 10 MG tablet TAKE 1 TABLET DAILY 90 tablet 1  . atenolol (TENORMIN) 25 MG tablet TK 1/2 T PO D B BRE FOR BP 90 tablet 1  . atorvastatin (LIPITOR) 20 MG tablet TAKE 1 TABLET DAILY 90 tablet 1  . baclofen (LIORESAL) 10 MG tablet Take 1 tablet (10 mg total) by mouth 3 (three) times daily as needed for muscle spasms. 270 each 3  . buPROPion (WELLBUTRIN XL) 150 MG 24 hr tablet TAKE 1 TABLET DAILY 90 tablet 1  . CALCIUM-MAGNESIUM PO Take 1 tablet by mouth daily.    Marland Kitchen docusate sodium (COLACE) 50 MG capsule Take 100 mg by mouth at bedtime.    Marland Kitchen EPINEPHrine 0.3 mg/0.3 mL IJ SOAJ injection Inject 0.3 mLs (0.3 mg total) into the muscle once. 2 Device 2  .  gabapentin (NEURONTIN) 300 MG capsule Take 1 capsule (300 mg total) by mouth 2 (two) times daily. 180 capsule 3  . HYDROcodone-acetaminophen (NORCO/VICODIN) 5-325 MG per tablet Take 1 tablet by mouth daily as needed for moderate pain or severe pain.     Marland Kitchen ibuprofen (ADVIL,MOTRIN) 600 MG tablet     . letrozole (FEMARA) 2.5 MG tablet TAKE 1 TABLET DAILY 90 tablet 3  . levothyroxine  (SYNTHROID, LEVOTHROID) 88 MCG tablet TAKE 1 TABLET DAILY 90 tablet 1  . losartan-hydrochlorothiazide (HYZAAR) 100-25 MG tablet TAKE 1 TABLET EVERY MORNING BEFORE BREAKFAST 90 tablet 1  . Multiple Vitamins-Minerals (MULTIVITAMINS THER. W/MINERALS) TABS Take 1 tablet by mouth daily.    Marland Kitchen omeprazole (PRILOSEC) 40 MG capsule TAKE 1 CAPSULE DAILY 90 capsule 1  . phentermine 37.5 MG capsule Take 1 capsule (37.5 mg total) by mouth every morning. 30 capsule 0  . potassium chloride SA (K-DUR,KLOR-CON) 20 MEQ tablet Take 1 tablet (20 mEq total) by mouth daily. 90 tablet 1  . Probiotic Product (PROBIOTIC DAILY PO) Take 1 capsule by mouth daily.     Marland Kitchen venlafaxine XR (EFFEXOR-XR) 150 MG 24 hr capsule Take 1 capsule (150 mg total) by mouth daily with breakfast. 90 capsule 1  . vitamin E 400 UNIT capsule Take 800 Units by mouth daily.     No current facility-administered medications for this visit.     PHYSICAL EXAMINATION: ECOG PERFORMANCE STATUS: 1 - Symptomatic but completely ambulatory  Vitals:   06/09/16 1504  BP: (!) 116/48  Pulse: (!) 52  Resp: 18  Temp: 98 F (36.7 C)   Filed Weights   06/09/16 1504  Weight: 214 lb 6.4 oz (97.3 kg)    GENERAL:alert, no distress and comfortable SKIN: skin color, texture, turgor are normal, no rashes or significant lesions EYES: normal, Conjunctiva are pink and non-injected, sclera clear OROPHARYNX:no exudate, no erythema and lips, buccal mucosa, and tongue normal  NECK: supple, thyroid normal size, non-tender, without nodularity LYMPH:  no palpable lymphadenopathy in the cervical, axillary or inguinal LUNGS: clear to auscultation and percussion with normal breathing effort HEART: regular rate & rhythm and no murmurs and no lower extremity edema ABDOMEN:abdomen soft, non-tender and normal bowel sounds MUSCULOSKELETAL:no cyanosis of digits and no clubbing  NEURO: alert & oriented x 3 with fluent speech, no focal motor/sensory deficits EXTREMITIES: No  lower extremity edema BREAST: Right mastectomy No palpable masses or nodules in left breasts. No palpable axillary supraclavicular or infraclavicular adenopathy no breast tenderness or nipple discharge. (exam performed in the presence of a chaperone)  LABORATORY DATA:  I have reviewed the data as listed   Chemistry      Component Value Date/Time   NA 137 12/24/2015 1110   NA 138 08/18/2014 1551   K 3.1 (L) 12/24/2015 1110   K 3.7 08/18/2014 1551   CL 100 12/24/2015 1110   CL 100 11/30/2012 1029   CO2 27 12/24/2015 1110   CO2 27 08/18/2014 1551   BUN 22 12/24/2015 1110   BUN 17.2 08/18/2014 1551   CREATININE 1.29 (H) 12/24/2015 1110   CREATININE 1.3 (H) 08/18/2014 1551      Component Value Date/Time   CALCIUM 9.7 12/24/2015 1110   CALCIUM 9.1 08/18/2014 1551   ALKPHOS 145 (H) 12/24/2015 1110   ALKPHOS 137 08/18/2014 1551   AST 18 12/24/2015 1110   AST 19 08/18/2014 1551   ALT 16 12/24/2015 1110   ALT 21 08/18/2014 1551   BILITOT 0.6 12/24/2015 1110  BILITOT 0.6 10/15/2014 1420   BILITOT 0.46 08/18/2014 1551       Lab Results  Component Value Date   WBC 7.1 12/24/2015   HGB 12.5 12/24/2015   HCT 36.9 12/24/2015   MCV 82.7 12/24/2015   PLT 247.0 12/24/2015   NEUTROABS 4.4 12/24/2015    ASSESSMENT & PLAN:  Breast cancer of upper-outer quadrant of right female breast ([facility]) Left breast invasive ductal carcinoma with DCIS ER/PR positive HER-2 positive: Status post mastectomy, adjuvant chemotherapy in 1 year of Herceptin. Currently on letrozole adjuvant hormonal therapy started 03/13/13 .   Letrozole toxicities: 1. No major hot flashes or myalgias.  Knee infection: patient has had multiple problems with her Left knee  Breast cancer surveillance: 1. Breast exam done 06/09/2016  2. Mammogram September 2017 at [facility]  Multiple sclerosis: Causing fatigue and body pains. Patient was offered a treatment for MS but she does not want to take it because one of the  Adverse effects was neutropenia. Because of her knee infection , she does not want to take any medication that would lower her white blood cell count.  Anxiety/depression: On Wellbutrin and Cymbalta.  Return to clinic in 1 year for follow-up.   No orders of the defined types were placed in this encounter.  The patient has a good understanding of the overall plan. she agrees with it. she will call with any problems that may develop before the next visit here.   Rulon Eisenmenger, MD 06/09/16

## 2016-06-11 ENCOUNTER — Other Ambulatory Visit: Payer: Self-pay | Admitting: Family Medicine

## 2016-06-16 ENCOUNTER — Other Ambulatory Visit: Payer: Self-pay | Admitting: Nurse Practitioner

## 2016-06-17 ENCOUNTER — Encounter: Payer: Self-pay | Admitting: Family Medicine

## 2016-06-17 ENCOUNTER — Ambulatory Visit (INDEPENDENT_AMBULATORY_CARE_PROVIDER_SITE_OTHER): Payer: Commercial Managed Care - PPO | Admitting: Family Medicine

## 2016-06-17 VITALS — BP 119/73 | HR 53 | Temp 98.1°F | Resp 16 | Ht 66.0 in | Wt 213.5 lb

## 2016-06-17 DIAGNOSIS — E039 Hypothyroidism, unspecified: Secondary | ICD-10-CM

## 2016-06-17 DIAGNOSIS — I10 Essential (primary) hypertension: Secondary | ICD-10-CM

## 2016-06-17 DIAGNOSIS — E785 Hyperlipidemia, unspecified: Secondary | ICD-10-CM

## 2016-06-17 LAB — BASIC METABOLIC PANEL
BUN: 25 mg/dL — ABNORMAL HIGH (ref 6–23)
CO2: 30 mEq/L (ref 19–32)
Calcium: 9.2 mg/dL (ref 8.4–10.5)
Chloride: 100 mEq/L (ref 96–112)
Creatinine, Ser: 1.29 mg/dL — ABNORMAL HIGH (ref 0.40–1.20)
GFR: 45.15 mL/min — ABNORMAL LOW (ref >60.00)
Glucose, Bld: 113 mg/dL — ABNORMAL HIGH (ref 70–99)
Potassium: 3.5 mEq/L (ref 3.5–5.1)
Sodium: 139 mEq/L (ref 135–145)

## 2016-06-17 LAB — CBC WITH DIFFERENTIAL/PLATELET
Basophils Absolute: 0 10*3/uL (ref 0.0–0.1)
Basophils Relative: 0.7 % (ref 0.0–3.0)
Eosinophils Absolute: 0.4 10*3/uL (ref 0.0–0.7)
Eosinophils Relative: 5.2 % — ABNORMAL HIGH (ref 0.0–5.0)
HCT: 37.3 % (ref 36.0–46.0)
Hemoglobin: 12.8 g/dL (ref 12.0–15.0)
Lymphocytes Relative: 27.1 % (ref 12.0–46.0)
Lymphs Abs: 1.9 10*3/uL (ref 0.7–4.0)
MCHC: 34.2 g/dL (ref 30.0–36.0)
MCV: 82.9 fl (ref 78.0–100.0)
Monocytes Absolute: 0.5 10*3/uL (ref 0.1–1.0)
Monocytes Relative: 6.5 % (ref 3.0–12.0)
Neutro Abs: 4.2 10*3/uL (ref 1.4–7.7)
Neutrophils Relative %: 60.5 % (ref 43.0–77.0)
Platelets: 226 10*3/uL (ref 150.0–400.0)
RBC: 4.5 Mil/uL (ref 3.87–5.11)
RDW: 13.4 % (ref 11.5–15.5)
WBC: 7 10*3/uL (ref 4.0–10.5)

## 2016-06-17 LAB — LIPID PANEL
Cholesterol: 161 mg/dL (ref 0–200)
HDL: 56.7 mg/dL (ref >39.00)
LDL Cholesterol: 70 mg/dL (ref 0–99)
NonHDL: 104.63
Total CHOL/HDL Ratio: 3
Triglycerides: 173 mg/dL — ABNORMAL HIGH (ref 0.0–149.0)
VLDL: 34.6 mg/dL (ref 0.0–40.0)

## 2016-06-17 LAB — HEPATIC FUNCTION PANEL
ALT: 15 U/L (ref 0–35)
AST: 14 U/L (ref 0–37)
Albumin: 4.4 g/dL (ref 3.5–5.2)
Alkaline Phosphatase: 135 U/L — ABNORMAL HIGH (ref 39–117)
Bilirubin, Direct: 0.2 mg/dL (ref 0.0–0.3)
Total Bilirubin: 1 mg/dL (ref 0.2–1.2)
Total Protein: 6.6 g/dL (ref 6.0–8.3)

## 2016-06-17 LAB — TSH: TSH: 2.48 u[IU]/mL (ref 0.35–4.50)

## 2016-06-17 MED ORDER — VENLAFAXINE HCL ER 150 MG PO CP24: 150.0000 mg | ORAL_CAPSULE | Freq: Every day | ORAL | 1 refills | Status: DC

## 2016-06-17 NOTE — Assessment & Plan Note (Signed)
Chronic problem.  Pt is asymptomatic w/ exception of fatigue.  Check labs.  Adjust meds prn

## 2016-06-17 NOTE — Assessment & Plan Note (Signed)
Chronic problem.  Adequate control.  Asymptomatic w/ exception of fatigue.  Will stop beta blocker due to fatigue and bradycardia.  Check labs.  No other anticipated med changes.

## 2016-06-17 NOTE — Progress Notes (Signed)
Pre visit review using our clinic review tool, if applicable. No additional management support is needed unless otherwise documented below in the visit note. 

## 2016-06-17 NOTE — Progress Notes (Signed)
   Subjective:    Patient ID: Julie Ross, female    DOB: 06/08/1961, 55 y.o.   MRN: [id number]  HPI HTN- chronic problem, on Amlodipine, Atenolol, Losartan HCTZ w/ good control.  No CP, SOB, HAs, visual changes, edema.  HR only 53 today.  Hyperlipidemia- chronic problem, on Lipitor.  Denies abd pain, N/V, myalgias.  Hypothyroid- chronic problem.  On Levothyroxine 2mcg daily.  + fatigue.  Denies changes to hair/skin/nails, constipation.     Review of Systems For ROS see HPI     Objective:   Physical Exam  Constitutional: She is oriented to person, place, and time. She appears well-developed and well-nourished. No distress.  HENT:  Head: Normocephalic and atraumatic.  Eyes: Conjunctivae and EOM are normal. Pupils are equal, round, and reactive to light.  Neck: Normal range of motion. Neck supple. No thyromegaly present.  Cardiovascular: Normal rate, regular rhythm, normal heart sounds and intact distal pulses.   No murmur heard. Pulmonary/Chest: Effort normal and breath sounds normal. No respiratory distress.  Abdominal: Soft. She exhibits no distension. There is no tenderness.  Musculoskeletal: She exhibits no edema.  Lymphadenopathy:    She has no cervical adenopathy.  Neurological: She is alert and oriented to person, place, and time.  Skin: Skin is warm and dry.  Psychiatric: She has a normal mood and affect. Her behavior is normal.  Vitals reviewed.         Assessment & Plan:

## 2016-06-17 NOTE — Patient Instructions (Signed)
Schedule your complete physical in 6 months We'll notify you of your lab results and make any changes if needed STOP the Atenolol Call with any questions or concerns Happy New Year!!!!

## 2016-06-17 NOTE — Assessment & Plan Note (Signed)
Chronic problem.  Tolerating statin w/o difficulty.  Check labs.  Adjust meds prn  

## 2016-06-22 ENCOUNTER — Other Ambulatory Visit (INDEPENDENT_AMBULATORY_CARE_PROVIDER_SITE_OTHER): Payer: Commercial Managed Care - PPO

## 2016-06-22 ENCOUNTER — Ambulatory Visit: Payer: Commercial Managed Care - PPO | Admitting: Neurology

## 2016-06-22 DIAGNOSIS — R7309 Other abnormal glucose: Secondary | ICD-10-CM

## 2016-06-22 LAB — HEMOGLOBIN A1C: Hgb A1c MFr Bld: 6 % (ref 4.6–6.5)

## 2016-06-23 ENCOUNTER — Ambulatory Visit: Payer: Commercial Managed Care - PPO | Admitting: Neurology

## 2016-06-28 ENCOUNTER — Encounter: Payer: Self-pay | Admitting: Neurology

## 2016-07-13 ENCOUNTER — Other Ambulatory Visit: Payer: Self-pay | Admitting: General Practice

## 2016-07-13 DIAGNOSIS — S92355A Nondisplaced fracture of fifth metatarsal bone, left foot, initial encounter for closed fracture: Secondary | ICD-10-CM | POA: Diagnosis not present

## 2016-07-13 MED ORDER — VENLAFAXINE HCL ER 150 MG PO CP24: 150.0000 mg | ORAL_CAPSULE | Freq: Every day | ORAL | 1 refills | Status: DC

## 2016-07-19 ENCOUNTER — Encounter: Payer: Self-pay | Admitting: Neurology

## 2016-07-19 ENCOUNTER — Ambulatory Visit (INDEPENDENT_AMBULATORY_CARE_PROVIDER_SITE_OTHER): Payer: Commercial Managed Care - PPO | Admitting: Neurology

## 2016-07-19 VITALS — BP 112/78 | HR 80 | Resp 16 | Ht 66.0 in | Wt 213.0 lb

## 2016-07-19 DIAGNOSIS — G2581 Restless legs syndrome: Secondary | ICD-10-CM

## 2016-07-19 DIAGNOSIS — R5383 Other fatigue: Secondary | ICD-10-CM

## 2016-07-19 DIAGNOSIS — G4733 Obstructive sleep apnea (adult) (pediatric): Secondary | ICD-10-CM

## 2016-07-19 DIAGNOSIS — Z8669 Personal history of other diseases of the nervous system and sense organs: Secondary | ICD-10-CM | POA: Insufficient documentation

## 2016-07-19 DIAGNOSIS — G35 Multiple sclerosis: Secondary | ICD-10-CM | POA: Diagnosis not present

## 2016-07-19 MED ORDER — ALPRAZOLAM 0.5 MG PO TABS: 0.5000 mg | ORAL_TABLET | Freq: Three times a day (TID) | ORAL | 1 refills | Status: DC | PRN

## 2016-07-19 MED ORDER — PHENTERMINE HCL 30 MG PO CAPS: 30.0000 mg | ORAL_CAPSULE | ORAL | 5 refills | Status: DC

## 2016-07-19 NOTE — Progress Notes (Signed)
[facility]  PATIENT: Julie Ross DOB: 1961/05/08  REFERRING DOCTOR OR PCP:  Annye Asa SOURCE: patient and records from [facility]  _________________________________   HISTORICAL  CHIEF COMPLAINT:  Chief Complaint  Patient presents with  . Multiple Sclerosis    She is not on any MS med but continues Femara for breast cancer.  Ortho still prefers she not start an MS med due to hx. of slow healing infection related to knee replacement.  Since last ov she has broken her left foot/fim    HISTORY OF PRESENT ILLNESS:  Julie Ross is a 56 year old woman with multiple sclerosis and breast cancer  .  MS:   She is currently off a disease modifying therapy. Aubagio was prescribed but she decided not to take it and reports her orthopedic surgeon refers her to not be on any immunomodulatory agent at this time due to her septic joint in the past.   Her last MRI was stable.     Ortho issues:   Her right knee is much better but the left knee is still painful.   She had an infection after surgery requiring removal of hardware and subsequent surgery.   She broke her foot falling to the ground from a seated position (she was sick and dehydrated with flu symptoms).   She is wearing a boot.    Lightheadedness:   She often feels lightheaded when she stands up like she is going to pass out.   However, there has been no syncope.    Mood/cognitive:    She notes depression and some anxiety.   She is apathetic.       She notes reduced focus and attention and sometime mild memory issues.     Fatigue/sleep:   She notes fatigue daily, both physical and mental.   Fatigue is present when she wakes up and builds up some.     She has severe obstructive sleep apnea (AHI=33) that was diagnosed on 11/20/2013.  She was placed on Auto-PAP but has not used it much (none in last few months).  She has a nasal mask .    She also has moderate RLS with some impact on sleep, helped by  nighttime gabapentin 300 mg and baclofen and xanax at bedtime.      During the day she is very sleepy but then has trouble falling asleep at night.      Provigil made her jittery.   Phentermine helped the sleepiness but she had mild stomach upset.  Gait/strength/sensation:   Gait issues are mild and mostly orthopedic in nature .   Her legs fatigue easily.   She occasioanally trips over her feet but has not fallen.  She denies any leg numbness.  Vision:   She had left optic neuritis in 2012. Left visual acuity improved but not to baseline.   Colors are desaturated on the left, esp when tired  Bladder:   Bladder function is fine.    MS History:    She was diagnosed with MS in 1999 after presenting with optic neuritis.   She had a spinal tap, MRI and angiogram.   Initially, the diagnosis was uncertain but she had changes in the MRI over time leading to a diagnosis of MS a few years later.    Initially, she was placed on Copaxone, then switched to Avonex due to skin reactions.   She then switched to Tysabri when she had some mild breakthrough disease. She tolerated Tysabri very well  and her MS did well.  However, she was JCV antibody positive so she stopped in 2012.   She did not restart Avonex as she did not want to go back to an injection.    Her last exacerbation was optic neuritis in 2012 treated with steroids. She proved but did not get back to baseline.  This exacerbation occurred off any DMT.  She tried Gilenya in 2013. She had trouble tolerating Gilenya and discontinued shortly after starting it. She was going to start Tecfidera but was diagnosed with breast cancer in 2013 and opted not to start at that time. She received chemotherapy including Taxotere, carboplatin, Herceptin in 2013 and 2014. An MRI of the brain in 2015 showed only mild MS progression compared to her previous one in 2011.      REVIEW OF SYSTEMS: Constitutional: No fevers, chills, sweats, or change in appetite.  Notes fatigue Eyes:  No visual changes, double vision, eye pain Ear, nose and throat: No hearing loss, ear pain, nasal congestion, sore throat.  Recent URI Cardiovascular: No chest pain, palpitations Respiratory: No shortness of breath at rest or with exertion.   No wheezes.  Has OSA GastrointestinaI: No nausea, vomiting, diarrhea, abdominal pain, fecal incontinence Genitourinary: No dysuria, urinary retention or frequency.  She has 1 times nocturia most nights Musculoskeletal: No neck pain, back pain.  She has bilateral knee pain Integumentary: No rash, pruritus, skin lesions Neurological: as above Psychiatric: No depression at this time.  Some anxiety Endocrine: No palpitations, diaphoresis, change in appetite, change in weigh or increased thirst Hematologic/Lymphatic: No anemia, purpura, petechiae. Allergic/Immunologic: No itchy/runny eyes, nasal congestion, recent allergic reactions, rashes  ALLERGIES: Allergies  Allergen Reactions  . Bee Venom Anaphylaxis    HOME MEDICATIONS:  Current Outpatient Prescriptions:  .  ALPRAZolam (XANAX) 0.5 MG tablet, Take 1 tablet (0.5 mg total) by mouth 3 (three) times daily as needed. for anxiety, Disp: 270 tablet, Rfl: 1 .  amLODipine (NORVASC) 10 MG tablet, TAKE 1 TABLET DAILY, Disp: 90 tablet, Rfl: 1 .  atorvastatin (LIPITOR) 20 MG tablet, TAKE 1 TABLET DAILY, Disp: 90 tablet, Rfl: 1 .  baclofen (LIORESAL) 10 MG tablet, Take 1 tablet (10 mg total) by mouth 3 (three) times daily as needed for muscle spasms., Disp: 270 each, Rfl: 3 .  buPROPion (WELLBUTRIN XL) 150 MG 24 hr tablet, TAKE 1 TABLET DAILY, Disp: 90 tablet, Rfl: 1 .  CALCIUM-MAGNESIUM PO, Take 1 tablet by mouth daily., Disp: , Rfl:  .  docusate sodium (COLACE) 50 MG capsule, Take 100 mg by mouth at bedtime., Disp: , Rfl:  .  EPINEPHrine 0.3 mg/0.3 mL IJ SOAJ injection, Inject 0.3 mLs (0.3 mg total) into the muscle once., Disp: 2 Device, Rfl: 2 .  gabapentin (NEURONTIN) 300 MG capsule, Take 1 capsule  (300 mg total) by mouth 2 (two) times daily., Disp: 180 capsule, Rfl: 3 .  HYDROcodone-acetaminophen (NORCO/VICODIN) 5-325 MG per tablet, Take 1 tablet by mouth daily as needed for moderate pain or severe pain. , Disp: , Rfl:  .  ibuprofen (ADVIL,MOTRIN) 600 MG tablet, , Disp: , Rfl:  .  letrozole (FEMARA) 2.5 MG tablet, TAKE 1 TABLET DAILY, Disp: 90 tablet, Rfl: 3 .  levothyroxine (SYNTHROID, LEVOTHROID) 88 MCG tablet, TAKE 1 TABLET DAILY, Disp: 90 tablet, Rfl: 1 .  losartan-hydrochlorothiazide (HYZAAR) 100-25 MG tablet, TAKE 1 TABLET EVERY MORNING BEFORE BREAKFAST, Disp: 90 tablet, Rfl: 1 .  Multiple Vitamins-Minerals (MULTIVITAMINS THER. W/MINERALS) TABS, Take 1 tablet by mouth daily., Disp: ,  Rfl:  .  omeprazole (PRILOSEC) 40 MG capsule, TAKE 1 CAPSULE DAILY, Disp: 90 capsule, Rfl: 1 .  potassium chloride SA (K-DUR,KLOR-CON) 20 MEQ tablet, Take 1 tablet (20 mEq total) by mouth daily., Disp: 90 tablet, Rfl: 1 .  Probiotic Product (PROBIOTIC DAILY PO), Take 1 capsule by mouth daily. , Disp: , Rfl:  .  venlafaxine XR (EFFEXOR-XR) 150 MG 24 hr capsule, Take 1 capsule (150 mg total) by mouth daily with breakfast., Disp: 90 capsule, Rfl: 1 .  vitamin E 400 UNIT capsule, Take 800 Units by mouth daily., Disp: , Rfl:  .  phentermine 30 MG capsule, Take 1 capsule (30 mg total) by mouth every morning., Disp: 30 capsule, Rfl: 5  PAST MEDICAL HISTORY: Past Medical History:  Diagnosis Date  . Anemia    h/o - ? bld. transfusion - more recent- ?2013  . Anxiety    uses xanax mainly for sleep  . Arthritis    knees   . Breast cancer ([place]) 11/2011   lul/ER+PR+, x1 lymph node   . Cancer (HCC)    squamous cell skin cancer x7  . Depression   . GERD (gastroesophageal reflux disease)   . H/O bone density study 03/2011  . H/O colonoscopy   . H/O echocardiogram    last one 02/2013, due to chemotherapy  . Heart burn   . Hx of radiation therapy 04/17/12- 06/04/12   left chest wall, axilla, L supraclavicular  fossa 4500 cGy, left mastectomy scar/chest wall 5940 cGy  . Hypertension   . Hypothyroidism   . Multiple sclerosis ([place])   . Neuromuscular disorder ([place])    MS TX WITH CYMBALTA   . Sleep apnea    MILD SLEEP APNEA, NO (Needed) MACHINE  6 YRS AGO HPT [facility]   . Wears glasses     PAST SURGICAL HISTORY: Past Surgical History:  Procedure Laterality Date  . CESAREAN SECTION     X2   . COLONOSCOPY    . ESOPHAGOGASTRODUODENOSCOPY    . FLEXIBLE SIGMOIDOSCOPY    . I&D KNEE WITH POLY EXCHANGE  03/02/2012   Procedure: IRRIGATION AND DEBRIDEMENT KNEE WITH POLY EXCHANGE;  Surgeon: Alta Corning, MD;  Location: [facility];  Service: Orthopedics;  Laterality: Left;  I&D of total knee with Possible Poly Exchange   . JOINT REPLACEMENT  Feb 2013   left knee- multiple surgeries   . KNEE ARTHROSCOPY     LT KNEE 04/2011  . KNEE ARTHROSCOPY  84   rt  . MASTECTOMY    . MASTECTOMY W/ SENTINEL NODE BIOPSY  12/19/2011   Procedure: MASTECTOMY WITH SENTINEL LYMPH NODE BIOPSY;  Surgeon: Haywood Lasso, MD;  Location: [facility];  Service: General;  Laterality: Left;  left breast and left axilla   . MOHS SURGERY     right face  . NO PAST SURGERIES     BLADDER SLING  4-5 YRS AGO   . PORT-A-CATH REMOVAL  03/06/2012   Procedure: REMOVAL PORT-A-CATH;  Surgeon: Odis Hollingshead, MD;  Location: [facility];  Service: General;  Laterality: N/A;  . PORT-A-CATH REMOVAL Right 08/22/2012   Procedure: REMOVAL PORT-A-CATH;  Surgeon: Edward Jolly, MD;  Location: [facility];  Service: General;  Laterality: Right;  . PORTACATH PLACEMENT  01/16/2012   Procedure: INSERTION PORT-A-CATH;  Surgeon: Haywood Lasso, MD;  Location: [facility];  Service: General;  Laterality: Right;  [place] Cath Placement   . PORTACATH PLACEMENT  05/01/2012   Procedure: INSERTION PORT-A-CATH;  Surgeon: Haywood Lasso, MD;  Location: [facility];  Service: General;  Laterality: Right;  right internal jugular port-a-cath  insertion  . TEE WITHOUT CARDIOVERSION  03/06/2012   Procedure: TRANSESOPHAGEAL ECHOCARDIOGRAM (TEE);  Surgeon: Josue Hector, MD;  Location: [facility];  Service: Cardiovascular;  Laterality: N/A;  Rm. 2927  . TONSILLECTOMY    . TOTAL KNEE ARTHROPLASTY  08/15/2011   Procedure: TOTAL KNEE ARTHROPLASTY; lft Surgeon: Alta Corning, MD;  Location: [facility];  Service: Orthopedics;  Laterality: Left;  RIGHT KNEE CORTIZONE INJECTION  . TOTAL KNEE ARTHROPLASTY Left 08/18/2012   Procedure:  Irrigation and debridement of LEFT total knee;  Removal of total knee parts; Implant of spacers;  Surgeon: Kerin Salen, MD;  Location: [facility];  Service: Orthopedics;  Laterality: Left;  . TOTAL KNEE ARTHROPLASTY Right 08/12/2013  . TOTAL KNEE ARTHROPLASTY Right 08/12/2013   Procedure: RIGHT TOTAL KNEE ARTHROPLASTY;  Surgeon: Kerin Salen, MD;  Location: [facility];  Service: Orthopedics;  Laterality: Right;  . TOTAL KNEE REVISION Left 04/19/2013   Procedure: TOTAL KNEE REVISION AND REMOVAL CEMENT SPACER;  Surgeon: Kerin Salen, MD;  Location: [facility];  Service: Orthopedics;  Laterality: Left;  . TUMOR REMOVAL     FROM PELVIS AGE 58    FAMILY HISTORY: Family History  Problem Relation Age of Onset  . Breast cancer Paternal Aunt     dx in her 61s  . Heart disease Maternal Grandfather   . Lung cancer Maternal Uncle     smoker  . Breast cancer Paternal Grandmother     dx in her 77s  . Ovarian cancer Paternal Aunt     dx in her 28s  . Arthritis Mother   . Hypertension Mother   . Heart disease Mother     CHF  . Dementia Father   . Diabetes type II Father     SOCIAL HISTORY:  Social History   Social History  . Marital status: Married    Spouse name: N/A  . Number of children: 2  . Years of education: N/A   Occupational History  .  [employer]    Social History Main Topics  . Smoking status: Never Smoker  . Smokeless tobacco: Never Used  . Alcohol use No  . Drug use: No  . Sexual activity: Yes    Other Topics Concern  . Not on file   Social History Narrative  . No narrative on file     PHYSICAL EXAM  Vitals:   07/19/16 1546  BP: 112/78  Pulse: 80  Resp: 16  Weight: 213 lb (96.6 kg)  Height: 5\' 6"  (1.676 m)    Body mass index is 34.38 kg/m.   General: The patient is well-developed and well-nourished and in no acute distress   Neurologic Exam  Mental status: The patient is alert and oriented x 3 at the time of the examination. The patient has apparent normal recent and remote memory, with an apparently normal attention span and concentration ability.   Speech is normal.  Cranial nerves: Extraocular movements are full.   There is good facial sensation to soft touch bilaterally.Facial strength is normal.  Trapezius and sternocleidomastoid strength is normal. No dysarthria is noted.  The tongue is midline, and the patient has symmetric elevation of the soft palate. No obvious hearing deficits are noted.  Motor:  Muscle bulk is normal.   Tone is normal. Strength is  5 / 5 in all 4 extremities.  Sensory: Sensory testing is intact to soft touch and vibration sensation in all 4 extremities.  Coordination: Cerebellar testing reveals good finger-nose-finger and heel-to-shin bilaterally.  Gait and station: Station is normal.   Gait is artthritic but better. Tandem gait is wide. Romberg is negative.   Reflexes: Deep tendon reflexes are symmetric and normal bilaterally in arms.       DIAGNOSTIC DATA (LABS, IMAGING, TESTING) - I reviewed patient records, labs, notes, testing and imaging myself where available.  Lab Results  Component Value Date   WBC 7.0 06/17/2016   HGB 12.8 06/17/2016   HCT 37.3 06/17/2016   MCV 82.9 06/17/2016   PLT 226.0 06/17/2016      Component Value Date/Time   NA 139 06/17/2016 1342   NA 138 08/18/2014 1551   K 3.5 06/17/2016 1342   K 3.7 08/18/2014 1551   CL 100 06/17/2016 1342   CL 100 11/30/2012 1029   CO2 30 06/17/2016 1342    CO2 27 08/18/2014 1551   GLUCOSE 113 (H) 06/17/2016 1342   GLUCOSE 117 08/18/2014 1551   GLUCOSE 91 11/30/2012 1029   BUN 25 (H) 06/17/2016 1342   BUN 17.2 08/18/2014 1551   CREATININE 1.29 (H) 06/17/2016 1342   CREATININE 1.3 (H) 08/18/2014 1551   CALCIUM 9.2 06/17/2016 1342   CALCIUM 9.1 08/18/2014 1551   PROT 6.6 06/17/2016 1342   PROT 6.8 10/15/2014 1420   PROT 7.1 08/18/2014 1551   ALBUMIN 4.4 06/17/2016 1342   ALBUMIN 4.5 10/15/2014 1420   ALBUMIN 4.0 08/18/2014 1551   AST 14 06/17/2016 1342   AST 19 08/18/2014 1551   ALT 15 06/17/2016 1342   ALT 21 08/18/2014 1551   ALKPHOS 135 (H) 06/17/2016 1342   ALKPHOS 137 08/18/2014 1551   BILITOT 1.0 06/17/2016 1342   BILITOT 0.6 10/15/2014 1420   BILITOT 0.46 08/18/2014 1551   GFRNONAA 47 (L) 12/09/2014 1110   GFRAA 55 (L) 12/09/2014 1110       ASSESSMENT AND PLAN  Multiple sclerosis (HCC)  Obstructive sleep apnea  Other fatigue  Restless leg syndrome  History of optic neuritis   1.  She will stay off an MS DMT for now as orthopedics prefer her not to go back on due to infection risk.  Check MRI of the brain in 6  Months (next visit) to assess stability.   Consider Tecfidera or Aubagio if she has breakthrough.   2.   She is advised to retry CPAP with a different mask as it will likely help her fatigue/sleepiness/attention and may help the quality of sleep.    Continue gabapentin at night.      Consider referral to dentistry for oral appliance if still can't uses CPAP 3.  Renew Alprazolam and phentermine 4.  She will return to see me in 6 months or sooner if she has new or worsening neurologic symptoms.  Richard A. Felecia Shelling, MD, PhD [id number], [id number] PM Certified in Neurology, Clinical Neurophysiology, Sleep Medicine, Pain Medicine and Neuroimaging  [facility] [address] [place],  [address] [phone]

## 2016-08-03 DIAGNOSIS — S92355D Nondisplaced fracture of fifth metatarsal bone, left foot, subsequent encounter for fracture with routine healing: Secondary | ICD-10-CM | POA: Diagnosis not present

## 2016-08-24 DIAGNOSIS — S92355D Nondisplaced fracture of fifth metatarsal bone, left foot, subsequent encounter for fracture with routine healing: Secondary | ICD-10-CM | POA: Diagnosis not present

## 2016-08-29 ENCOUNTER — Other Ambulatory Visit: Payer: Self-pay | Admitting: Family Medicine

## 2016-09-14 ENCOUNTER — Encounter: Payer: Self-pay | Admitting: Family Medicine

## 2016-09-14 ENCOUNTER — Ambulatory Visit (INDEPENDENT_AMBULATORY_CARE_PROVIDER_SITE_OTHER): Payer: Commercial Managed Care - PPO | Admitting: Family Medicine

## 2016-09-14 VITALS — BP 104/76 | HR 86 | Temp 97.9°F | Resp 18 | Ht 66.0 in

## 2016-09-14 DIAGNOSIS — H6981 Other specified disorders of Eustachian tube, right ear: Secondary | ICD-10-CM | POA: Diagnosis not present

## 2016-09-14 DIAGNOSIS — M8580 Other specified disorders of bone density and structure, unspecified site: Secondary | ICD-10-CM | POA: Diagnosis not present

## 2016-09-14 DIAGNOSIS — S92355A Nondisplaced fracture of fifth metatarsal bone, left foot, initial encounter for closed fracture: Secondary | ICD-10-CM | POA: Diagnosis not present

## 2016-09-14 MED ORDER — CETIRIZINE HCL 10 MG PO TABS: 10.0000 mg | ORAL_TABLET | Freq: Every day | ORAL | 11 refills | Status: DC

## 2016-09-14 MED ORDER — FLUTICASONE PROPIONATE 50 MCG/ACT NA SUSP: 2.0000 | Freq: Every day | NASAL | 6 refills | Status: DC

## 2016-09-14 NOTE — Progress Notes (Signed)
   Subjective:    Patient ID: Julie Ross, female    DOB: 01-12-1961, 56 y.o.   MRN: [id number]  HPI Ear pain- R ear pain.  sxs started ~3 months ago.  No fevers.  No drainage from ear.  + nasal congestion.  No sneezing.  No itchy/watery eyes.  Some associated dizziness.  Pain is constant- worse at night.  Hearing is muffled.   Review of Systems For ROS see HPI     Objective:   Physical Exam  Constitutional: She appears well-developed and well-nourished. No distress.  HENT:  Head: Normocephalic and atraumatic.  Right Ear: Tympanic membrane is retracted.  Left Ear: Tympanic membrane is retracted.  Nose: Mucosal edema and rhinorrhea present. Right sinus exhibits no maxillary sinus tenderness and no frontal sinus tenderness. Left sinus exhibits no maxillary sinus tenderness and no frontal sinus tenderness.  Mouth/Throat: Mucous membranes are normal. Posterior oropharyngeal erythema (w/ PND) present.  Eyes: Conjunctivae and EOM are normal. Pupils are equal, round, and reactive to light.  Neck: Normal range of motion. Neck supple.  Cardiovascular: Normal rate, regular rhythm and normal heart sounds.   Pulmonary/Chest: Effort normal and breath sounds normal. No respiratory distress. She has no wheezes. She has no rales.  Lymphadenopathy:    She has no cervical adenopathy.  Vitals reviewed.         Assessment & Plan:  Eustachian tube dysfxn- new.  Pt's sxs and PE consistent w/ dx.  Start nasal steroid and daily antihistamine.  Reviewed supportive care and red flags that should prompt return.  Pt expressed understanding and is in agreement w/ plan.

## 2016-09-14 NOTE — Patient Instructions (Signed)
Follow up as scheduled/needed Start the Zyrtec once daily for allergy congestion Use the Flonase- 2 sprays each nostril Drink plenty of fluids Call with any questions or concerns Hang in there! Happy Spring!!!

## 2016-09-15 LAB — HEPATIC FUNCTION PANEL
ALT: 20 U/L (ref 7–35)
AST: 19 U/L (ref 13–35)
Alkaline Phosphatase: 153 U/L — AB (ref 25–125)

## 2016-09-15 LAB — CBC AND DIFFERENTIAL
HCT: 39 % (ref 36–46)
Hemoglobin: 12.9 g/dL (ref 12.0–16.0)
Neutrophils Absolute: 4830 /uL
Platelets: 290 10*3/uL (ref 150–399)
WBC: 7 10^3/mL

## 2016-09-15 LAB — BASIC METABOLIC PANEL
Creatinine: 1.2 mg/dL — AB (ref 0.5–1.1)
Glucose: 122 mg/dL
Potassium: 3.2 mmol/L — AB (ref 3.4–5.3)
Sodium: 136 mmol/L — AB (ref 137–147)

## 2016-09-19 ENCOUNTER — Encounter: Payer: Self-pay | Admitting: Family Medicine

## 2016-09-20 MED ORDER — EPINEPHRINE 0.3 MG/0.3ML IJ SOAJ: 0.3000 mg | Freq: Once | INTRAMUSCULAR | 2 refills | Status: AC

## 2016-09-23 ENCOUNTER — Encounter: Payer: Self-pay | Admitting: General Practice

## 2016-10-03 ENCOUNTER — Other Ambulatory Visit: Payer: Self-pay | Admitting: Neurology

## 2016-10-13 DIAGNOSIS — S92355G Nondisplaced fracture of fifth metatarsal bone, left foot, subsequent encounter for fracture with delayed healing: Secondary | ICD-10-CM | POA: Diagnosis not present

## 2016-10-31 DIAGNOSIS — S92355K Nondisplaced fracture of fifth metatarsal bone, left foot, subsequent encounter for fracture with nonunion: Secondary | ICD-10-CM | POA: Diagnosis not present

## 2016-11-10 DIAGNOSIS — S92355D Nondisplaced fracture of fifth metatarsal bone, left foot, subsequent encounter for fracture with routine healing: Secondary | ICD-10-CM | POA: Diagnosis not present

## 2016-11-23 ENCOUNTER — Ambulatory Visit (INDEPENDENT_AMBULATORY_CARE_PROVIDER_SITE_OTHER): Payer: Commercial Managed Care - PPO | Admitting: Family Medicine

## 2016-11-23 ENCOUNTER — Encounter: Payer: Self-pay | Admitting: Family Medicine

## 2016-11-23 VITALS — BP 118/80 | HR 69 | Temp 98.0°F | Resp 17 | Ht 66.0 in | Wt 217.2 lb

## 2016-11-23 DIAGNOSIS — R040 Epistaxis: Secondary | ICD-10-CM | POA: Diagnosis not present

## 2016-11-23 NOTE — Patient Instructions (Signed)
Follow up as needed/scheduled We'll call you with your ENT appt If you have another bleed- lean forward, apply ice to the bridge of the nose, use Afrin nasal spray if possible, and use OB tampons to provide a temporary pressure dressing Call with any questions or concerns Hang in there!!!

## 2016-11-23 NOTE — Progress Notes (Signed)
   Subjective:    Patient ID: Julie Ross, female    DOB: [birth year], 56 y.o.   MRN: [id number]  HPI Nose bleed- sxs started last week.  Pt reports she has had 4 major bleeds and 2 smaller bleeds.  Major bleeds involved her coughing up clots that had drained down back of throat.  EMS had to be called b/c bleed had not stopped after 40 minutes.  Pt did not have packing at that time.  Bleeding starts w/ blowing nose.  Typically starts w/ R nostril and then L will also bleed.  Not on ASA or blood thinners.  BP is well controlled today.   Review of Systems For ROS see HPI     Objective:   Physical Exam  Constitutional: She appears well-developed and well-nourished. No distress.  HENT:  Head: Normocephalic and atraumatic.  Mouth/Throat: Oropharynx is clear and moist. No oropharyngeal exudate.  Scabs noted in nostrils bilaterally.  No active bleeding.  No polyp or obvious source of bleeding  Vitals reviewed.         Assessment & Plan:  Nose bleed- new.  Pt has had 4 major bleeds and 2 minor bleeds in last week.  Will refer to ENT as pt likely needs to have vessel cauterized.  Reviewed supportive care and red flags that should prompt return.  Pt expressed understanding and is in agreement w/ plan.

## 2016-11-23 NOTE — Progress Notes (Signed)
Pre visit review using our clinic review tool, if applicable. No additional management support is needed unless otherwise documented below in the visit note. 

## 2016-11-28 DIAGNOSIS — R040 Epistaxis: Secondary | ICD-10-CM | POA: Diagnosis not present

## 2016-12-05 ENCOUNTER — Other Ambulatory Visit: Payer: Self-pay | Admitting: Family Medicine

## 2016-12-12 DIAGNOSIS — S92355D Nondisplaced fracture of fifth metatarsal bone, left foot, subsequent encounter for fracture with routine healing: Secondary | ICD-10-CM | POA: Diagnosis not present

## 2016-12-13 DIAGNOSIS — Z85828 Personal history of other malignant neoplasm of skin: Secondary | ICD-10-CM | POA: Diagnosis not present

## 2016-12-13 DIAGNOSIS — L821 Other seborrheic keratosis: Secondary | ICD-10-CM | POA: Diagnosis not present

## 2016-12-13 DIAGNOSIS — D229 Melanocytic nevi, unspecified: Secondary | ICD-10-CM | POA: Diagnosis not present

## 2016-12-13 DIAGNOSIS — L570 Actinic keratosis: Secondary | ICD-10-CM | POA: Diagnosis not present

## 2016-12-19 ENCOUNTER — Encounter: Payer: Commercial Managed Care - PPO | Admitting: Family Medicine

## 2016-12-21 ENCOUNTER — Other Ambulatory Visit: Payer: Self-pay | Admitting: Family Medicine

## 2016-12-28 ENCOUNTER — Encounter: Payer: Self-pay | Admitting: Family Medicine

## 2016-12-28 ENCOUNTER — Ambulatory Visit (INDEPENDENT_AMBULATORY_CARE_PROVIDER_SITE_OTHER): Payer: Commercial Managed Care - PPO | Admitting: Family Medicine

## 2016-12-28 VITALS — BP 119/88 | HR 80 | Temp 98.1°F | Resp 16 | Ht 66.0 in | Wt 215.5 lb

## 2016-12-28 DIAGNOSIS — C50411 Malignant neoplasm of upper-outer quadrant of right female breast: Secondary | ICD-10-CM | POA: Diagnosis not present

## 2016-12-28 DIAGNOSIS — Z0000 Encounter for general adult medical examination without abnormal findings: Secondary | ICD-10-CM | POA: Diagnosis not present

## 2016-12-28 DIAGNOSIS — Z170 Estrogen receptor positive status [ER+]: Secondary | ICD-10-CM

## 2016-12-28 LAB — BASIC METABOLIC PANEL
BUN: 18 mg/dL (ref 6–23)
CO2: 28 mEq/L (ref 19–32)
Calcium: 9.6 mg/dL (ref 8.4–10.5)
Chloride: 96 mEq/L (ref 96–112)
Creatinine, Ser: 1.27 mg/dL — ABNORMAL HIGH (ref 0.40–1.20)
GFR: 45.88 mL/min — ABNORMAL LOW (ref >60.00)
Glucose, Bld: 106 mg/dL — ABNORMAL HIGH (ref 70–99)
Potassium: 3.1 mEq/L — ABNORMAL LOW (ref 3.5–5.1)
Sodium: 135 mEq/L (ref 135–145)

## 2016-12-28 LAB — LIPID PANEL
Cholesterol: 164 mg/dL (ref 0–200)
HDL: 71.2 mg/dL (ref >39.00)
LDL Cholesterol: 78 mg/dL (ref 0–99)
NonHDL: 92.52
Total CHOL/HDL Ratio: 2
Triglycerides: 73 mg/dL (ref 0.0–149.0)
VLDL: 14.6 mg/dL (ref 0.0–40.0)

## 2016-12-28 LAB — CBC WITH DIFFERENTIAL/PLATELET
Basophils Absolute: 0.1 10*3/uL (ref 0.0–0.1)
Basophils Relative: 1 % (ref 0.0–3.0)
Eosinophils Absolute: 0.2 10*3/uL (ref 0.0–0.7)
Eosinophils Relative: 2.3 % (ref 0.0–5.0)
HCT: 34 % — ABNORMAL LOW (ref 36.0–46.0)
Hemoglobin: 11.4 g/dL — ABNORMAL LOW (ref 12.0–15.0)
Lymphocytes Relative: 30.6 % (ref 12.0–46.0)
Lymphs Abs: 2.1 10*3/uL (ref 0.7–4.0)
MCHC: 33.4 g/dL (ref 30.0–36.0)
MCV: 78.3 fl (ref 78.0–100.0)
Monocytes Absolute: 0.5 10*3/uL (ref 0.1–1.0)
Monocytes Relative: 7 % (ref 3.0–12.0)
Neutro Abs: 4.1 10*3/uL (ref 1.4–7.7)
Neutrophils Relative %: 59.1 % (ref 43.0–77.0)
Platelets: 291 10*3/uL (ref 150.0–400.0)
RBC: 4.34 Mil/uL (ref 3.87–5.11)
RDW: 13.8 % (ref 11.5–15.5)
WBC: 6.9 10*3/uL (ref 4.0–10.5)

## 2016-12-28 LAB — HEPATIC FUNCTION PANEL
ALT: 14 U/L (ref 0–35)
AST: 15 U/L (ref 0–37)
Albumin: 4.5 g/dL (ref 3.5–5.2)
Alkaline Phosphatase: 143 U/L — ABNORMAL HIGH (ref 39–117)
Bilirubin, Direct: 0.1 mg/dL (ref 0.0–0.3)
Total Bilirubin: 0.6 mg/dL (ref 0.2–1.2)
Total Protein: 7 g/dL (ref 6.0–8.3)

## 2016-12-28 LAB — VITAMIN D 25 HYDROXY (VIT D DEFICIENCY, FRACTURES): VITD: 42.39 ng/mL (ref 30.00–100.00)

## 2016-12-28 LAB — TSH: TSH: 3.68 u[IU]/mL (ref 0.35–4.50)

## 2016-12-28 NOTE — Patient Instructions (Addendum)
Follow up in 6 months to recheck BP and cholesterol We'll notify you of your lab results and make any changes if needed Continue to work on healthy diet and regular exercise- you can do it! You are up to date on colonoscopy- yay! Call with any questions or concerns Have a great summer!!!

## 2016-12-28 NOTE — Assessment & Plan Note (Signed)
Chronic problem.  Remains on Femara daily.  Will follow along.

## 2016-12-28 NOTE — Progress Notes (Signed)
Pre visit review using our clinic review tool, if applicable. No additional management support is needed unless otherwise documented below in the visit note. 

## 2016-12-28 NOTE — Assessment & Plan Note (Signed)
Pt's PE unchanged from previous.  UTD on mammo, pap, colonoscopy, immunizations.  Stressed need for healthy diet and regular exercise.  Check labs.  Anticipatory guidance provided.

## 2016-12-28 NOTE — Progress Notes (Signed)
   Subjective:    Patient ID: Julie Ross, female    DOB: 1960-07-12, 56 y.o.   MRN: [id number]  HPI CPE- UTD on colonoscopy, mammo, pap, immunizations.  No concerns today   Review of Systems Patient reports no vision/ hearing changes, adenopathy,fever, weight change,  persistant/recurrent hoarseness , swallowing issues, chest pain, palpitations, edema, persistant/recurrent cough, hemoptysis, dyspnea (rest/exertional/paroxysmal nocturnal), gastrointestinal bleeding (melena, rectal bleeding), abdominal pain, significant heartburn, bowel changes, GU symptoms (dysuria, hematuria, incontinence), Gyn symptoms (abnormal  bleeding, pain),  syncope, focal weakness, memory loss, numbness & tingling, skin/hair/nail changes, abnormal bruising or bleeding, anxiety, or depression.     Objective:   Physical Exam General Appearance:    Alert, cooperative, no distress, appears stated age, overweight  Head:    Normocephalic, without obvious abnormality, atraumatic  Eyes:    PERRL, conjunctiva/corneas clear, EOM's intact, fundi    benign, both eyes  Ears:    Normal TM's and external ear canals, both ears  Nose:   Nares normal, septum midline, mucosa normal, no drainage    or sinus tenderness  Throat:   Lips, mucosa, and tongue normal; teeth and gums normal  Neck:   Supple, symmetrical, trachea midline, no adenopathy;    Thyroid: no enlargement/tenderness/nodules  Back:     Symmetric, no curvature, ROM normal, no CVA tenderness  Lungs:     Clear to auscultation bilaterally, respirations unlabored  Chest Wall:    No tenderness or deformity   Heart:    Regular rate and rhythm, S1 and S2 normal, no murmur, rub   or gallop  Breast Exam:    Deferred to GYN  Abdomen:     Soft, non-tender, bowel sounds active all four quadrants,    no masses, no organomegaly  Genitalia:    Deferred to GYN  Rectal:    Extremities:   Extremities normal, atraumatic, no cyanosis or edema  Pulses:   2+ and symmetric all  extremities  Skin:   Skin color, texture, turgor normal, no rashes or lesions  Lymph nodes:   Cervical, supraclavicular, and axillary nodes normal  Neurologic:   CNII-XII intact, normal strength, sensation and reflexes    throughout          Assessment & Plan:

## 2016-12-30 ENCOUNTER — Other Ambulatory Visit: Payer: Self-pay | Admitting: General Practice

## 2016-12-30 ENCOUNTER — Other Ambulatory Visit (INDEPENDENT_AMBULATORY_CARE_PROVIDER_SITE_OTHER): Payer: Commercial Managed Care - PPO

## 2016-12-30 DIAGNOSIS — R748 Abnormal levels of other serum enzymes: Secondary | ICD-10-CM | POA: Diagnosis not present

## 2016-12-30 LAB — GAMMA GT: GGT: 43 U/L (ref 7–51)

## 2016-12-30 MED ORDER — POTASSIUM CHLORIDE CRYS ER 20 MEQ PO TBCR: 20.0000 meq | EXTENDED_RELEASE_TABLET | Freq: Every day | ORAL | 3 refills | Status: DC

## 2017-01-02 ENCOUNTER — Encounter: Payer: Self-pay | Admitting: General Practice

## 2017-01-03 DIAGNOSIS — G4733 Obstructive sleep apnea (adult) (pediatric): Secondary | ICD-10-CM | POA: Diagnosis not present

## 2017-01-05 ENCOUNTER — Telehealth: Payer: Self-pay | Admitting: *Deleted

## 2017-01-05 ENCOUNTER — Encounter: Payer: Self-pay | Admitting: Neurology

## 2017-01-05 MED ORDER — GABAPENTIN 300 MG PO CAPS: 300.0000 mg | ORAL_CAPSULE | Freq: Two times a day (BID) | ORAL | 0 refills | Status: DC

## 2017-01-05 NOTE — Telephone Encounter (Signed)
Gabapentin escribed to [facility] per emailed request.  Pt. only needed small amt. of med until her mail order arrives/fim

## 2017-01-16 DIAGNOSIS — S92355D Nondisplaced fracture of fifth metatarsal bone, left foot, subsequent encounter for fracture with routine healing: Secondary | ICD-10-CM | POA: Diagnosis not present

## 2017-01-17 ENCOUNTER — Ambulatory Visit (INDEPENDENT_AMBULATORY_CARE_PROVIDER_SITE_OTHER): Payer: Commercial Managed Care - PPO | Admitting: Neurology

## 2017-01-17 ENCOUNTER — Encounter: Payer: Self-pay | Admitting: Neurology

## 2017-01-17 VITALS — BP 121/73 | HR 76 | Resp 18 | Ht 66.0 in | Wt 218.0 lb

## 2017-01-17 DIAGNOSIS — C50411 Malignant neoplasm of upper-outer quadrant of right female breast: Secondary | ICD-10-CM | POA: Diagnosis not present

## 2017-01-17 DIAGNOSIS — R5383 Other fatigue: Secondary | ICD-10-CM

## 2017-01-17 DIAGNOSIS — G4733 Obstructive sleep apnea (adult) (pediatric): Secondary | ICD-10-CM

## 2017-01-17 DIAGNOSIS — Z170 Estrogen receptor positive status [ER+]: Secondary | ICD-10-CM

## 2017-01-17 DIAGNOSIS — G35 Multiple sclerosis: Secondary | ICD-10-CM

## 2017-01-17 DIAGNOSIS — G2581 Restless legs syndrome: Secondary | ICD-10-CM | POA: Diagnosis not present

## 2017-01-17 DIAGNOSIS — R208 Other disturbances of skin sensation: Secondary | ICD-10-CM

## 2017-01-17 MED ORDER — HYDROCODONE-ACETAMINOPHEN 5-325 MG PO TABS: 1.0000 | ORAL_TABLET | Freq: Every day | ORAL | 0 refills | Status: DC

## 2017-01-17 MED ORDER — SERTRALINE HCL 100 MG PO TABS: 100.0000 mg | ORAL_TABLET | Freq: Every day | ORAL | 11 refills | Status: DC

## 2017-01-17 NOTE — Progress Notes (Signed)
[facility]  PATIENT: Julie Ross DOB: [birth year]  REFERRING DOCTOR OR PCP:  Annye Asa SOURCE: patient and records from [facility]  _________________________________   HISTORICAL  CHIEF COMPLAINT:  Chief Complaint  Patient presents with  . Multiple Sclerosis    Still off of MS dmt b/c of hx. of slow healing infection after right knee replacement.  She has not restarted CPAP yet--has been busy dealing with mother's health problems and recent nsg. home placement/fim  . Sleep Apnea    HISTORY OF PRESENT ILLNESS:  Julie Ross is a 56 y.o. woman with multiple sclerosis and breast cancer.  Additionally, she has had double procedures due to a knee replacement that got infected.  MS:   She is currently off a disease modifying therapy. Her MRI July 2017 was stable.The right TKR got infected and she required removal of hardware. She feels it is doing much better. She is still reluctant to go on a DMT due to risk of infection.   Leg aching:   Her entire leg (not just knee) is aching at night and that wakes her up.    Shifting has not helped much.   Getting out of bed and walking does not help but a heating pad does.  This only bothers her at night.   It does not bother her if she sits a long time.     She takes baclofen , xanax and gabapentin 600 mg at bedtime and sometimes takes another one at 3-4 am.   The gabapentin seems to help but the second dose gives her a hangover until noon.    Ear sensation/lightheaded:   She gets outs of lightheadedness that often start with a stuffed up sensation in her right ear.   Mood/cognitive:    She notes depression and some anxiety.   She is apathetic.       She notes reduced focus and attention and sometime mild memory issues.   Her nose bled a lot with Flonase.   She already takes Zyrtec  Fatigue/sleep:   She has fatigue that is both physical and mental. The fatigue is present when she wakes up but worsens as the day  goes on. Of note, she has severe OSA (AHI = 33) diagnosed 11/20/2013.   She was placed on Auto-PAP .  She recently tried the Dreamwear mask and feels it is easier to wear than other masks.    She also has moderate RLS with some impact on sleep, helped by nighttime gabapentin 300 mg and baclofen and xanax at bedtime.      During the day she is very sleepy but then has trouble falling asleep at night.      Provigil made her jittery.   Phentermine helped the sleepiness but she had mild stomach upset.  Gait/strength/sensation:   Gait issues are mild and mostly orthopedic in nature .   Her legs fatigue easily.   She occasioanally trips over her feet and she broke her 5th toe once.    She denies any leg numbness.  Vision:   She had left optic neuritis in 2012. Left visual acuity improved but not to baseline.   Colors are desaturated on the left, esp when tired  Bladder:   Bladder function is fine.    MS History:    She was diagnosed with MS in 1999 after presenting with optic neuritis.   She had a spinal tap, MRI and angiogram.   Initially, the diagnosis was uncertain but  she had changes in the MRI over time leading to a diagnosis of MS a few years later.    Initially, she was placed on Copaxone, then switched to Avonex due to skin reactions.   She then switched to Tysabri when she had some mild breakthrough disease. She tolerated Tysabri very well and her MS did well.  However, she was JCV antibody positive so she stopped in 2012.   She did not restart Avonex as she did not want to go back to an injection.    Her last exacerbation was optic neuritis in 2012 treated with steroids. She proved but did not get back to baseline.  This exacerbation occurred off any DMT.  She tried Gilenya in 2013. She had trouble tolerating Gilenya and discontinued shortly after starting it. She was going to start Tecfidera but was diagnosed with breast cancer in 2013 and opted not to start at that time. She received chemotherapy  including Taxotere, carboplatin, Herceptin in 2013 and 2014. An MRI of the brain in 2015 showed only mild MS progression compared to her previous one in 2011.      REVIEW OF SYSTEMS: Constitutional: No fevers, chills, sweats, or change in appetite.  Notes fatigue Eyes: No visual changes, double vision, eye pain Ear, nose and throat: No hearing loss, ear pain, nasal congestion, sore throat.  Recent URI Cardiovascular: No chest pain, palpitations Respiratory: No shortness of breath at rest or with exertion.   No wheezes.  Has OSA GastrointestinaI: No nausea, vomiting, diarrhea, abdominal pain, fecal incontinence Genitourinary: No dysuria, urinary retention or frequency.  She has 1 times nocturia most nights Musculoskeletal: No neck pain, back pain.  She has bilateral knee pain Integumentary: No rash, pruritus, skin lesions Neurological: as above Psychiatric: No depression at this time.  Some anxiety Endocrine: No palpitations, diaphoresis, change in appetite, change in weigh or increased thirst Hematologic/Lymphatic: No anemia, purpura, petechiae. Allergic/Immunologic: No itchy/runny eyes, nasal congestion, recent allergic reactions, rashes  ALLERGIES: Allergies  Allergen Reactions  . Bee Venom Anaphylaxis    HOME MEDICATIONS:  Current Outpatient Prescriptions:  .  ALPRAZolam (XANAX) 0.5 MG tablet, Take 1 tablet (0.5 mg total) by mouth 3 (three) times daily as needed. for anxiety, Disp: 270 tablet, Rfl: 1 .  amLODipine (NORVASC) 10 MG tablet, TAKE 1 TABLET DAILY, Disp: 90 tablet, Rfl: 1 .  atorvastatin (LIPITOR) 20 MG tablet, TAKE 1 TABLET DAILY, Disp: 90 tablet, Rfl: 1 .  baclofen (LIORESAL) 10 MG tablet, TAKE 1 TABLET THREE TIMES A DAY AS NEEDED FOR MUSCLE SPASMS, Disp: 270 tablet, Rfl: 3 .  buPROPion (WELLBUTRIN XL) 150 MG 24 hr tablet, TAKE 1 TABLET DAILY, Disp: 90 tablet, Rfl: 1 .  CALCIUM-MAGNESIUM PO, Take 1 tablet by mouth daily., Disp: , Rfl:  .  cetirizine (ZYRTEC) 10 MG  tablet, Take 1 tablet (10 mg total) by mouth daily., Disp: 30 tablet, Rfl: 11 .  gabapentin (NEURONTIN) 300 MG capsule, Take 1 capsule (300 mg total) by mouth 2 (two) times daily., Disp: 30 capsule, Rfl: 0 .  HYDROcodone-acetaminophen (NORCO/VICODIN) 5-325 MG tablet, Take 1 tablet by mouth at bedtime., Disp: 30 tablet, Rfl: 0 .  ibuprofen (ADVIL,MOTRIN) 600 MG tablet, , Disp: , Rfl:  .  letrozole (FEMARA) 2.5 MG tablet, TAKE 1 TABLET DAILY, Disp: 90 tablet, Rfl: 3 .  levothyroxine (SYNTHROID, LEVOTHROID) 88 MCG tablet, TAKE 1 TABLET DAILY, Disp: 90 tablet, Rfl: 1 .  losartan-hydrochlorothiazide (HYZAAR) 100-25 MG tablet, TAKE 1 TABLET EVERY MORNING BEFORE BREAKFAST, Disp:  90 tablet, Rfl: 1 .  Multiple Vitamins-Minerals (MULTIVITAMINS THER. W/MINERALS) TABS, Take 1 tablet by mouth daily., Disp: , Rfl:  .  omeprazole (PRILOSEC) 40 MG capsule, TAKE 1 CAPSULE DAILY, Disp: 90 capsule, Rfl: 1 .  potassium chloride SA (K-DUR,KLOR-CON) 20 MEQ tablet, Take 1 tablet (20 mEq total) by mouth daily., Disp: 30 tablet, Rfl: 3 .  Probiotic Product (PROBIOTIC DAILY PO), Take 1 capsule by mouth daily. , Disp: , Rfl:  .  sertraline (ZOLOFT) 100 MG tablet, Take 1 tablet (100 mg total) by mouth daily., Disp: 30 tablet, Rfl: 11 .  vitamin E 400 UNIT capsule, Take 800 Units by mouth daily., Disp: , Rfl:   PAST MEDICAL HISTORY: Past Medical History:  Diagnosis Date  . Anemia    h/o - ? bld. transfusion - more recent- ?2013  . Anxiety    uses xanax mainly for sleep  . Arthritis    knees   . Breast cancer ([employer]) 11/2011   lul/ER+PR+, x1 lymph node   . Cancer (HCC)    squamous cell skin cancer x7  . Depression   . GERD (gastroesophageal reflux disease)   . H/O bone density study 03/2011  . H/O colonoscopy   . H/O echocardiogram    last one 02/2013, due to chemotherapy  . Heart burn   . Hx of radiation therapy 04/17/12- 06/04/12   left chest wall, axilla, L supraclavicular fossa 4500 cGy, left mastectomy  scar/chest wall 5940 cGy  . Hypertension   . Hypothyroidism   . Multiple sclerosis ([place])   . Neuromuscular disorder ([place])    MS TX WITH CYMBALTA   . Sleep apnea    MILD SLEEP APNEA, NO (Needed) MACHINE  6 YRS AGO [facility]   . Wears glasses     PAST SURGICAL HISTORY: Past Surgical History:  Procedure Laterality Date  . CESAREAN SECTION     X2   . COLONOSCOPY    . ESOPHAGOGASTRODUODENOSCOPY    . FLEXIBLE SIGMOIDOSCOPY    . I&D KNEE WITH POLY EXCHANGE  03/02/2012   Procedure: IRRIGATION AND DEBRIDEMENT KNEE WITH POLY EXCHANGE;  Surgeon: Alta Corning, MD;  Location: [facility];  Service: Orthopedics;  Laterality: Left;  I&D of total knee with Possible Poly Exchange   . JOINT REPLACEMENT  Feb 2013   left knee- multiple surgeries   . KNEE ARTHROSCOPY     LT KNEE 04/2011  . KNEE ARTHROSCOPY  82   rt  . MASTECTOMY    . MASTECTOMY W/ SENTINEL NODE BIOPSY  12/19/2011   Procedure: MASTECTOMY WITH SENTINEL LYMPH NODE BIOPSY;  Surgeon: Haywood Lasso, MD;  Location: [facility];  Service: General;  Laterality: Left;  left breast and left axilla   . MOHS SURGERY     right face  . NO PAST SURGERIES     BLADDER SLING  4-5 YRS AGO   . PORT-A-CATH REMOVAL  03/06/2012   Procedure: REMOVAL PORT-A-CATH;  Surgeon: Odis Hollingshead, MD;  Location: [facility];  Service: General;  Laterality: N/A;  . PORT-A-CATH REMOVAL Right 08/22/2012   Procedure: REMOVAL PORT-A-CATH;  Surgeon: Edward Jolly, MD;  Location: [facility];  Service: General;  Laterality: Right;  . PORTACATH PLACEMENT  01/16/2012   Procedure: INSERTION PORT-A-CATH;  Surgeon: Haywood Lasso, MD;  Location: [facility];  Service: General;  Laterality: Right;  [place] Cath Placement   . PORTACATH PLACEMENT  05/01/2012   Procedure: INSERTION PORT-A-CATH;  Surgeon: Haywood Lasso, MD;  Location: [facility];  Service: General;  Laterality: Right;  right internal jugular port-a-cath insertion  . TEE WITHOUT  CARDIOVERSION  03/06/2012   Procedure: TRANSESOPHAGEAL ECHOCARDIOGRAM (TEE);  Surgeon: Josue Hector, MD;  Location: [facility];  Service: Cardiovascular;  Laterality: N/A;  Rm. [id number]  . TONSILLECTOMY    . TOTAL KNEE ARTHROPLASTY  08/15/2011   Procedure: TOTAL KNEE ARTHROPLASTY; lft Surgeon: Alta Corning, MD;  Location: [facility];  Service: Orthopedics;  Laterality: Left;  RIGHT KNEE CORTIZONE INJECTION  . TOTAL KNEE ARTHROPLASTY Left 08/18/2012   Procedure:  Irrigation and debridement of LEFT total knee;  Removal of total knee parts; Implant of spacers;  Surgeon: Kerin Salen, MD;  Location: [facility];  Service: Orthopedics;  Laterality: Left;  . TOTAL KNEE ARTHROPLASTY Right 08/12/2013  . TOTAL KNEE ARTHROPLASTY Right 08/12/2013   Procedure: RIGHT TOTAL KNEE ARTHROPLASTY;  Surgeon: Kerin Salen, MD;  Location: [facility];  Service: Orthopedics;  Laterality: Right;  . TOTAL KNEE REVISION Left 04/19/2013   Procedure: TOTAL KNEE REVISION AND REMOVAL CEMENT SPACER;  Surgeon: Kerin Salen, MD;  Location: [facility];  Service: Orthopedics;  Laterality: Left;  . TUMOR REMOVAL     FROM PELVIS AGE 10    FAMILY HISTORY: Family History  Problem Relation Age of Onset  . Breast cancer Paternal Aunt        dx in her 44s  . Heart disease Maternal Grandfather   . Lung cancer Maternal Uncle        smoker  . Breast cancer Paternal Grandmother        dx in her 62s  . Ovarian cancer Paternal Aunt        dx in her 79s  . Arthritis Mother   . Hypertension Mother   . Heart disease Mother        CHF  . Dementia Father   . Diabetes type II Father     SOCIAL HISTORY:  Social History   Social History  . Marital status: Married    Spouse name: N/A  . Number of children: 2  . Years of education: N/A   Occupational History  .  [employer]    Social History Main Topics  . Smoking status: Never Smoker  . Smokeless tobacco: Never Used  . Alcohol use No  . Drug use: No  . Sexual activity: Yes   Other  Topics Concern  . Not on file   Social History Narrative  . No narrative on file     PHYSICAL EXAM  Vitals:   01/17/17 1428  BP: 121/73  Pulse: 76  Resp: 18  Weight: 218 lb (98.9 kg)  Height: 5\' 6"  (1.676 m)    Body mass index is 35.19 kg/m.   General: The patient is well-developed and well-nourished and in no acute distress   Neurologic Exam  Mental status: The patient is alert and oriented x 3 at the time of the examination. The patient has apparent normal recent and remote memory, with an apparently normal attention span and concentration ability.   Speech is normal.  Cranial nerves: Extraocular movements are full.  Facial strength and sensation is normal. The tongue is midline, and the patient has symmetric elevation of the soft palate. No obvious hearing deficits are noted.  Motor:  Muscle bulk is normal.   Tone is normal. Strength is  5 / 5 in all 4 extremities.   Sensory: She has intact sensation to touch and  vibration in the arms or legs..  Coordination: Cerebellar testing reveals good finger-nose-finger and heel-to-shin bilaterally.  Gait and station: Station is normal.   Gait is mildly arthritic. Tandem gait is wide. The Romberg is negative.  Reflexes: Deep tendon reflexes are symmetric and normal bilaterally in arms.       DIAGNOSTIC DATA (LABS, IMAGING, TESTING) - I reviewed patient records, labs, notes, testing and imaging myself where available.  Lab Results  Component Value Date   WBC 6.9 12/28/2016   HGB 11.4 (L) 12/28/2016   HCT 34.0 (L) 12/28/2016   MCV 78.3 12/28/2016   PLT 291.0 12/28/2016      Component Value Date/Time   NA 135 12/28/2016 1139   NA 136 (A) 09/15/2016   NA 138 08/18/2014 1551   K 3.1 (L) 12/28/2016 1139   K 3.7 08/18/2014 1551   CL 96 12/28/2016 1139   CL 100 11/30/2012 1029   CO2 28 12/28/2016 1139   CO2 27 08/18/2014 1551   GLUCOSE 106 (H) 12/28/2016 1139   GLUCOSE 117 08/18/2014 1551   GLUCOSE 91 11/30/2012  1029   BUN 18 12/28/2016 1139   BUN 17.2 08/18/2014 1551   CREATININE 1.27 (H) 12/28/2016 1139   CREATININE 1.3 (H) 08/18/2014 1551   CALCIUM 9.6 12/28/2016 1139   CALCIUM 9.1 08/18/2014 1551   PROT 7.0 12/28/2016 1139   PROT 6.8 10/15/2014 1420   PROT 7.1 08/18/2014 1551   ALBUMIN 4.5 12/28/2016 1139   ALBUMIN 4.5 10/15/2014 1420   ALBUMIN 4.0 08/18/2014 1551   AST 15 12/28/2016 1139   AST 19 08/18/2014 1551   ALT 14 12/28/2016 1139   ALT 21 08/18/2014 1551   ALKPHOS 143 (H) 12/28/2016 1139   ALKPHOS 137 08/18/2014 1551   BILITOT 0.6 12/28/2016 1139   BILITOT 0.6 10/15/2014 1420   BILITOT 0.46 08/18/2014 1551   GFRNONAA 47 (L) 12/09/2014 1110   GFRAA 55 (L) 12/09/2014 1110       ASSESSMENT AND PLAN  Relapsing remitting multiple sclerosis (HCC) - Plan: MR BRAIN W WO CONTRAST  Other fatigue  Obstructive sleep apnea  Restless leg syndrome  Dysesthesia - Plan: MR BRAIN W WO CONTRAST  Malignant neoplasm of upper-outer quadrant of right breast in female, estrogen receptor positive ([place])   1.  We will check an MRI of the brain with and without contrast. If she has any new lesions, consider Tecfidera or Aubagio as they are likely to have the least effect on the immune system as far as risk of infection.  2.  She feels the dreamwear mask is better for CPAP and is able to use it more.  3.  I will stop the Effexor and place her on sertraline instead as it is less likely to worsen restless leg syndrome. Additionally I will add hydrocodone nightly. 4.  She will return to see me in 6 months or sooner if she has new or worsening neurologic symptoms.  40 minutes face-to-face evaluation with greater than one half of the time counseling and coordinating care about her MS and related symptoms. Richard A. Felecia Shelling, MD, PhD 5/97/4163, 8:45 PM Certified in Neurology, [place] [facility], Sleep Medicine, Pain Medicine and Neuroimaging  [facility] [address]  [place], [place] [address] [phone]

## 2017-01-18 ENCOUNTER — Other Ambulatory Visit: Payer: Self-pay | Admitting: Neurology

## 2017-01-25 ENCOUNTER — Other Ambulatory Visit: Payer: Commercial Managed Care - PPO

## 2017-01-25 DIAGNOSIS — C50912 Malignant neoplasm of unspecified site of left female breast: Secondary | ICD-10-CM | POA: Diagnosis not present

## 2017-01-31 ENCOUNTER — Ambulatory Visit (HOSPITAL_COMMUNITY)
Admission: RE | Admit: 2017-01-31 | Discharge: 2017-01-31 | Disposition: A | Discharge status: Home / Self Care | Payer: Commercial Managed Care - PPO | Source: Ambulatory Visit | Attending: Orthopedic Surgery | Admitting: Orthopedic Surgery

## 2017-01-31 ENCOUNTER — Other Ambulatory Visit (HOSPITAL_COMMUNITY): Payer: Self-pay | Admitting: Orthopedic Surgery

## 2017-01-31 DIAGNOSIS — M79605 Pain in left leg: Secondary | ICD-10-CM

## 2017-01-31 DIAGNOSIS — M7989 Other specified soft tissue disorders: Secondary | ICD-10-CM | POA: Insufficient documentation

## 2017-01-31 NOTE — Progress Notes (Signed)
*  PRELIMINARY RESULTS* Vascular Ultrasound Left lower extremity venous duplex has been completed.  Preliminary findings: No evidence of deep vein thrombosis or baker's cysts.  Preliminary results called to Dr. Danella Maiers @ 16:21.  Everrett Coombe 01/31/2017, 4:21 PM

## 2017-02-01 ENCOUNTER — Ambulatory Visit: Payer: Commercial Managed Care - PPO

## 2017-02-01 DIAGNOSIS — M5416 Radiculopathy, lumbar region: Secondary | ICD-10-CM | POA: Diagnosis not present

## 2017-02-01 DIAGNOSIS — M47816 Spondylosis without myelopathy or radiculopathy, lumbar region: Secondary | ICD-10-CM | POA: Diagnosis not present

## 2017-02-14 DIAGNOSIS — Z853 Personal history of malignant neoplasm of breast: Secondary | ICD-10-CM | POA: Diagnosis not present

## 2017-02-14 DIAGNOSIS — C50912 Malignant neoplasm of unspecified site of left female breast: Secondary | ICD-10-CM | POA: Diagnosis not present

## 2017-02-23 ENCOUNTER — Other Ambulatory Visit: Payer: Self-pay | Admitting: Family Medicine

## 2017-02-25 ENCOUNTER — Other Ambulatory Visit: Payer: Self-pay | Admitting: Family Medicine

## 2017-02-27 ENCOUNTER — Encounter: Payer: Self-pay | Admitting: Neurology

## 2017-02-27 DIAGNOSIS — G35 Multiple sclerosis: Secondary | ICD-10-CM | POA: Diagnosis not present

## 2017-03-05 ENCOUNTER — Other Ambulatory Visit: Payer: Self-pay | Admitting: Hematology and Oncology

## 2017-03-06 ENCOUNTER — Telehealth: Payer: Self-pay | Admitting: *Deleted

## 2017-03-06 ENCOUNTER — Other Ambulatory Visit: Payer: Self-pay | Admitting: Family Medicine

## 2017-03-06 MED ORDER — SERTRALINE HCL 100 MG PO TABS: 100.0000 mg | ORAL_TABLET | Freq: Every day | ORAL | 3 refills | Status: DC

## 2017-03-06 NOTE — Telephone Encounter (Signed)
Zoloft, 90 day rx., escribed to [facility] per faxed request/fim

## 2017-03-15 ENCOUNTER — Encounter: Payer: Self-pay | Admitting: Neurology

## 2017-03-16 ENCOUNTER — Telehealth: Payer: Self-pay | Admitting: *Deleted

## 2017-03-16 NOTE — Telephone Encounter (Signed)
Spoke with Julie Ross and reviewed below MRI results with her. She verbalized understanding of same. She c/o increased muscle spasms in her legs.  Sts. Baclofen 10mg  po tid does help. Per RAS, ok to increase to QID. She is agreeable, will call back if this doesn't help/fim

## 2017-03-16 NOTE — Telephone Encounter (Signed)
-----   Message from Britt Bottom, MD sent at 03/15/2017  5:56 PM EDT ----- Please let her know that I compared the MRI from 2 weeks ago with the MRI performed in 2017. There are no new lesions.

## 2017-03-20 DIAGNOSIS — M25572 Pain in left ankle and joints of left foot: Secondary | ICD-10-CM | POA: Diagnosis not present

## 2017-03-22 DIAGNOSIS — H9209 Otalgia, unspecified ear: Secondary | ICD-10-CM | POA: Diagnosis not present

## 2017-03-22 DIAGNOSIS — H6983 Other specified disorders of Eustachian tube, bilateral: Secondary | ICD-10-CM | POA: Diagnosis not present

## 2017-03-24 DIAGNOSIS — M545 Low back pain: Secondary | ICD-10-CM | POA: Diagnosis not present

## 2017-03-31 DIAGNOSIS — M5416 Radiculopathy, lumbar region: Secondary | ICD-10-CM | POA: Diagnosis not present

## 2017-03-31 DIAGNOSIS — M47816 Spondylosis without myelopathy or radiculopathy, lumbar region: Secondary | ICD-10-CM | POA: Diagnosis not present

## 2017-05-12 ENCOUNTER — Other Ambulatory Visit: Payer: Self-pay | Admitting: Family Medicine

## 2017-05-23 ENCOUNTER — Telehealth: Payer: Self-pay | Admitting: Hematology and Oncology

## 2017-05-23 DIAGNOSIS — M25562 Pain in left knee: Secondary | ICD-10-CM | POA: Diagnosis not present

## 2017-05-23 DIAGNOSIS — Z96651 Presence of right artificial knee joint: Secondary | ICD-10-CM | POA: Diagnosis not present

## 2017-05-23 DIAGNOSIS — Z96652 Presence of left artificial knee joint: Secondary | ICD-10-CM | POA: Diagnosis not present

## 2017-05-23 DIAGNOSIS — M25561 Pain in right knee: Secondary | ICD-10-CM | POA: Diagnosis not present

## 2017-05-23 NOTE — Telephone Encounter (Signed)
Left message for patient re appt moved from 12/20 to 12/18. Schedule mailed

## 2017-06-03 ENCOUNTER — Other Ambulatory Visit: Payer: Self-pay | Admitting: Family Medicine

## 2017-06-05 NOTE — Assessment & Plan Note (Deleted)
Left breast invasive ductal carcinoma with DCIS ER/PR positive HER-2 positive: Status post mastectomy, adjuvant chemotherapy in 1 year of Herceptin. Currently on letrozole adjuvant hormonal therapy started 03/13/13 .   Letrozole toxicities: 1. No major hot flashes or myalgias.  Knee infection: patient has had multiple problems with her Left knee And it was recently cleaned and repaired there she continues to have some achiness and pain in the knee.  Breast cancer surveillance: 1. Breast exam done 06/06/17  2. Mammogram 02/06/2014   Multiple sclerosis: Causing fatigue and body pains. Patient was offered a treatment for MS but she does not want to take it because one of the Adverse effects was neutropenia. Because of her knee infection , she does not want to take any medication that would lower her white blood cell count.  Anxiety/depression: On Wellbutrin and Cymbalta. Hypokalemia : previously potassium is 3.1 in August 2015. I would like to recheck her potassium levels today. We also like to check her CBC in order to see if she is anemic because patient feels fatigued.  Return to clinic in 6 months for follow-up

## 2017-06-06 ENCOUNTER — Ambulatory Visit: Payer: Commercial Managed Care - PPO | Admitting: Hematology and Oncology

## 2017-06-08 ENCOUNTER — Ambulatory Visit: Payer: Commercial Managed Care - PPO | Admitting: Hematology and Oncology

## 2017-06-20 ENCOUNTER — Other Ambulatory Visit: Payer: Self-pay | Admitting: Family Medicine

## 2017-06-25 NOTE — Assessment & Plan Note (Signed)
Left breast invasive ductal carcinoma with DCIS ER/PR positive HER-2 positive: Status post mastectomy, adjuvant chemotherapy in 1 year of Herceptin. Currently on letrozole adjuvant hormonal therapy started 03/13/13 .   Letrozole toxicities: 1. No major hot flashes or myalgias.  Knee infection: patient has had multiple problems with her Left knee  Breast cancer surveillance: 1. Breast exam done 06/26/17  2. Mammogram September 2018 at [facility]  Multiple sclerosis: Causing fatigue and body pains. Patient was offered a treatment for MS but she does not want to take it because one of the Adverse effects was neutropenia. Because of her knee infection , she does not want to take any medication that would lower her white blood cell count.  Anxiety/depression: On Wellbutrin and Cymbalta.  Return to clinic in 1 year for follow-up.

## 2017-06-26 ENCOUNTER — Other Ambulatory Visit: Payer: Self-pay

## 2017-06-26 ENCOUNTER — Ambulatory Visit: Payer: Commercial Managed Care - PPO | Admitting: Family Medicine

## 2017-06-26 ENCOUNTER — Telehealth: Payer: Self-pay | Admitting: Hematology and Oncology

## 2017-06-26 ENCOUNTER — Inpatient Hospital Stay: Payer: Commercial Managed Care - PPO | Attending: Hematology and Oncology | Admitting: Hematology and Oncology

## 2017-06-26 ENCOUNTER — Encounter: Payer: Self-pay | Admitting: Family Medicine

## 2017-06-26 VITALS — BP 128/82 | HR 75 | Temp 97.9°F | Resp 16 | Ht 66.0 in | Wt 217.5 lb

## 2017-06-26 DIAGNOSIS — I10 Essential (primary) hypertension: Secondary | ICD-10-CM | POA: Diagnosis not present

## 2017-06-26 DIAGNOSIS — Z170 Estrogen receptor positive status [ER+]: Secondary | ICD-10-CM | POA: Insufficient documentation

## 2017-06-26 DIAGNOSIS — E039 Hypothyroidism, unspecified: Secondary | ICD-10-CM

## 2017-06-26 DIAGNOSIS — E785 Hyperlipidemia, unspecified: Secondary | ICD-10-CM

## 2017-06-26 DIAGNOSIS — Z96652 Presence of left artificial knee joint: Secondary | ICD-10-CM | POA: Diagnosis not present

## 2017-06-26 DIAGNOSIS — T8484XD Pain due to internal orthopedic prosthetic devices, implants and grafts, subsequent encounter: Secondary | ICD-10-CM

## 2017-06-26 DIAGNOSIS — C50411 Malignant neoplasm of upper-outer quadrant of right female breast: Secondary | ICD-10-CM | POA: Diagnosis not present

## 2017-06-26 DIAGNOSIS — F418 Other specified anxiety disorders: Secondary | ICD-10-CM | POA: Diagnosis not present

## 2017-06-26 DIAGNOSIS — G35 Multiple sclerosis: Secondary | ICD-10-CM | POA: Diagnosis not present

## 2017-06-26 DIAGNOSIS — Z79811 Long term (current) use of aromatase inhibitors: Secondary | ICD-10-CM | POA: Insufficient documentation

## 2017-06-26 DIAGNOSIS — R748 Abnormal levels of other serum enzymes: Secondary | ICD-10-CM | POA: Diagnosis not present

## 2017-06-26 MED ORDER — LETROZOLE 2.5 MG PO TABS: 2.5000 mg | ORAL_TABLET | Freq: Every day | ORAL | 3 refills | Status: DC

## 2017-06-26 NOTE — Progress Notes (Signed)
   Subjective:    Patient ID: Julie Ross, female    DOB: 17-Aug-1960, 57 y.o.   MRN: [id number]  HPI Hyperlipidemia- chronic problem, on Lipitor 20mg  daily.  No abd pain, N/V.  No regular exercise.  Hypothyroid- chronic problem, on Levothyroxine 42mcg daily.  + fatigue, 'all I want to do is sleep'.  sxs started ~3 months ago.  HTN- chronic problem, on Amlodipine 10mg  daily, Losartan HCTZ daily w/ adequate control.  No CP, SOB, HAs, visual changes, edema.   Review of Systems For ROS see HPI     Objective:   Physical Exam  Constitutional: She is oriented to person, place, and time. She appears well-developed and well-nourished. No distress.  obese  HENT:  Head: Normocephalic and atraumatic.  Eyes: Conjunctivae and EOM are normal. Pupils are equal, round, and reactive to light.  Neck: Normal range of motion. Neck supple. No thyromegaly present.  Cardiovascular: Normal rate, regular rhythm, normal heart sounds and intact distal pulses.  No murmur heard. Pulmonary/Chest: Effort normal and breath sounds normal. No respiratory distress.  Abdominal: Soft. She exhibits no distension. There is no tenderness.  Musculoskeletal: She exhibits no edema.  Lymphadenopathy:    She has no cervical adenopathy.  Neurological: She is alert and oriented to person, place, and time.  Skin: Skin is warm and dry.  Psychiatric: She has a normal mood and affect. Her behavior is normal.  Vitals reviewed.         Assessment & Plan:

## 2017-06-26 NOTE — Progress Notes (Signed)
Patient Care Team: Midge Minium, MD as PCP - General (Family Medicine) Juanda Chance, NP as Nurse Practitioner (Obstetrics and Gynecology) Gatha Mayer, MD as Consulting Physician (Gastroenterology)  DIAGNOSIS:  Encounter Diagnosis  Name Primary?  . Malignant neoplasm of upper-outer quadrant of right breast in female, estrogen receptor positive ([place])     SUMMARY OF ONCOLOGIC HISTORY:   Breast cancer of upper-outer quadrant of right female breast ([place])   11/22/2011 Initial Diagnosis    Cancer of upper-outer quadrant of female breast      12/19/2011 Surgery    Left mastectomy with SLN biopsy 2 foci of invasive ductal carcinoma 0.8 and 0.6 cm low-grade ER PR positive HER-2 positive ratio 3.27 Ki-67 11% one lymph node positive out of 4 (T1, N1, M0 stage II)      02/16/2012 - 04/14/2013 Chemotherapy    Taxotere, carboplatin, Herceptin x2 cycles complicated by bacteremia and left knee sepsis requiring revision of the knee. Herceptin maintenance every 3 weeks. Herceptin maintenance completed 04/14/2013      04/17/2012 - 06/04/2012 Radiation Therapy    Radiation therapy to the breast      03/13/2013 -  Anti-estrogen oral therapy    Letrozole 2.5 mg once daily       CHIEF COMPLIANT: Follow-up on letrozole therapy  INTERVAL HISTORY: Julie Ross is a 57 year old with above-mentioned history of left breast cancer treated with mastectomy followed by adjuvant chemotherapy and radiation.  She is currently on letrozole and appears to be tolerating extremely well.  She denies any hot flashes or myalgias.  She denies any lumps or nodules in the breast.  She gets her mammograms at [facility] every year.  REVIEW OF SYSTEMS:   Constitutional: Denies fevers, chills or abnormal weight loss Eyes: Denies blurriness of vision Ears, nose, mouth, throat, and face: Denies mucositis or sore throat Respiratory: Denies cough, dyspnea or wheezes Cardiovascular: Denies palpitation, chest  discomfort Gastrointestinal:  Denies nausea, heartburn or change in bowel habits Skin: Denies abnormal skin rashes Lymphatics: Denies new lymphadenopathy or easy bruising Neurological:Denies numbness, tingling or new weaknesses Behavioral/Psych: Mood is stable, no new changes  Extremities: No lower extremity edema Breast:  denies any pain or lumps or nodules in either breasts All other systems were reviewed with the patient and are negative.  I have reviewed the past medical history, past surgical history, social history and family history with the patient and they are unchanged from previous note.  ALLERGIES:  is allergic to bee venom.  MEDICATIONS:  Current Outpatient Medications  Medication Sig Dispense Refill  . ALPRAZolam (XANAX) 0.5 MG tablet TAKE 1 TABLET BY MOUTH THREE TIMES DAILY AS NEEDED FOR ANXIETY 270 tablet 0  . amLODipine (NORVASC) 10 MG tablet TAKE 1 TABLET DAILY 90 tablet 1  . atorvastatin (LIPITOR) 20 MG tablet TAKE 1 TABLET DAILY 90 tablet 1  . baclofen (LIORESAL) 10 MG tablet TAKE 1 TABLET THREE TIMES A DAY AS NEEDED FOR MUSCLE SPASMS 270 tablet 3  . buPROPion (WELLBUTRIN XL) 150 MG 24 hr tablet TAKE 1 TABLET DAILY 90 tablet 1  . CALCIUM-MAGNESIUM PO Take 1 tablet by mouth daily.    . cetirizine (ZYRTEC) 10 MG tablet Take 1 tablet (10 mg total) by mouth daily. 30 tablet 11  . gabapentin (NEURONTIN) 300 MG capsule Take 1 capsule (300 mg total) by mouth 2 (two) times daily. 30 capsule 0  . HYDROcodone-acetaminophen (NORCO/VICODIN) 5-325 MG tablet Take 1 tablet by mouth at bedtime. 30 tablet 0  .  ibuprofen (ADVIL,MOTRIN) 600 MG tablet     . letrozole (FEMARA) 2.5 MG tablet Take 1 tablet (2.5 mg total) by mouth daily. 90 tablet 3  . levothyroxine (SYNTHROID, LEVOTHROID) 88 MCG tablet TAKE 1 TABLET DAILY 90 tablet 1  . losartan-hydrochlorothiazide (HYZAAR) 100-25 MG tablet TAKE 1 TABLET EVERY MORNING BEFORE BREAKFAST 90 tablet 1  . Multiple Vitamins-Minerals  (MULTIVITAMINS THER. W/MINERALS) TABS Take 1 tablet by mouth daily.    Marland Kitchen omeprazole (PRILOSEC) 40 MG capsule TAKE 1 CAPSULE DAILY 90 capsule 1  . potassium chloride SA (K-DUR,KLOR-CON) 20 MEQ tablet TAKE 1 TABLET DAILY 90 tablet 1  . Probiotic Product (PROBIOTIC DAILY PO) Take 1 capsule by mouth daily.     . sertraline (ZOLOFT) 100 MG tablet Take 1 tablet (100 mg total) by mouth daily. 90 tablet 3  . venlafaxine XR (EFFEXOR-XR) 150 MG 24 hr capsule TAKE 1 CAPSULE DAILY WITH BREAKFAST 90 capsule 1  . vitamin E 400 UNIT capsule Take 800 Units by mouth daily.     No current facility-administered medications for this visit.     PHYSICAL EXAMINATION: ECOG PERFORMANCE STATUS: 0 - Asymptomatic  Vitals:   06/26/17 1054  BP: 130/70  Pulse: 82  Resp: 18  Temp: 98 F (36.7 C)  SpO2: 96%   Filed Weights   06/26/17 1054  Weight: 215 lb 11.2 oz (97.8 kg)    GENERAL:alert, no distress and comfortable SKIN: skin color, texture, turgor are normal, no rashes or significant lesions EYES: normal, Conjunctiva are pink and non-injected, sclera clear OROPHARYNX:no exudate, no erythema and lips, buccal mucosa, and tongue normal  NECK: supple, thyroid normal size, non-tender, without nodularity LYMPH:  no palpable lymphadenopathy in the cervical, axillary or inguinal LUNGS: clear to auscultation and percussion with normal breathing effort HEART: regular rate & rhythm and no murmurs and no lower extremity edema ABDOMEN:abdomen soft, non-tender and normal bowel sounds MUSCULOSKELETAL:no cyanosis of digits and no clubbing  NEURO: alert & oriented x 3 with fluent speech, no focal motor/sensory deficits EXTREMITIES: No lower extremity edema BREAST: No palpable masses or nodules in either right or left breasts. No palpable axillary supraclavicular or infraclavicular adenopathy no breast tenderness or nipple discharge. (exam performed in the presence of a chaperone)  LABORATORY DATA:  I have reviewed the  data as listed   Chemistry      Component Value Date/Time   NA 135 12/28/2016 1139   NA 136 (A) 09/15/2016   NA 138 08/18/2014 1551   K 3.1 (L) 12/28/2016 1139   K 3.7 08/18/2014 1551   CL 96 12/28/2016 1139   CL 100 11/30/2012 1029   CO2 28 12/28/2016 1139   CO2 27 08/18/2014 1551   BUN 18 12/28/2016 1139   BUN 17.2 08/18/2014 1551   CREATININE 1.27 (H) 12/28/2016 1139   CREATININE 1.3 (H) 08/18/2014 1551   GLU 122 09/15/2016      Component Value Date/Time   CALCIUM 9.6 12/28/2016 1139   CALCIUM 9.1 08/18/2014 1551   ALKPHOS 143 (H) 12/28/2016 1139   ALKPHOS 137 08/18/2014 1551   AST 15 12/28/2016 1139   AST 19 08/18/2014 1551   ALT 14 12/28/2016 1139   ALT 21 08/18/2014 1551   BILITOT 0.6 12/28/2016 1139   BILITOT 0.6 10/15/2014 1420   BILITOT 0.46 08/18/2014 1551       Lab Results  Component Value Date   WBC 6.9 12/28/2016   HGB 11.4 (L) 12/28/2016   HCT 34.0 (L) 12/28/2016  MCV 78.3 12/28/2016   PLT 291.0 12/28/2016   NEUTROABS 4.1 12/28/2016    ASSESSMENT & PLAN:  Breast cancer of upper-outer quadrant of right female breast ([facility]) Left breast invasive ductal carcinoma with DCIS ER/PR positive HER-2 positive: Status post mastectomy, adjuvant chemotherapy in 1 year of Herceptin. Currently on letrozole adjuvant hormonal therapy started 03/13/13 .   Letrozole toxicities: 1. No major hot flashes or myalgias. We discussed the role of extended adjuvant therapy.  She is willing to take antiestrogen treatment for a total of 7 years.  Knee infection: patient has had multiple problems with her Left knee  Breast cancer surveillance: 1. Breast exam done 06/26/17  2. Mammogram September 2018 at [facility]  Multiple sclerosis: Causing fatigue and body pains. Patient was offered a treatment for MS but she does not want to take it because one of the Adverse effects was neutropenia. Because of her knee infection , she does not want to take any medication that would lower  her white blood cell count.  Anxiety/depression: On Wellbutrin and Cymbalta.  Return to clinic in 1 year for follow-up.   I spent 25 minutes talking to the patient of which more than half was spent in counseling and coordination of care.  No orders of the defined types were placed in this encounter.  The patient has a good understanding of the overall plan. she agrees with it. she will call with any problems that may develop before the next visit here.   Harriette Ohara, MD 06/26/17

## 2017-06-26 NOTE — Patient Instructions (Signed)
Schedule your complete physical in 6 months We'll notify you of your lab results and make any changes if needed Try and make healthy food choices and get exercise as able Call with any questions or concerns Happy New Year!!!

## 2017-06-26 NOTE — Assessment & Plan Note (Signed)
Chronic problem.  + fatigue.  Check labs.  Adjust meds prn

## 2017-06-26 NOTE — Assessment & Plan Note (Signed)
Chronic problem.  Adequate control today.  Asymptomatic.  No anticipated med changes today.  Will follow closely.

## 2017-06-26 NOTE — Telephone Encounter (Signed)
Scheduled appt per 1/7 los - patient did not want avs or calendar.

## 2017-06-26 NOTE — Assessment & Plan Note (Signed)
Chronic problem.  Tolerating statin w/o difficulty.  Stressed need for healthy diet and regular exercise.  Check labs.  Adjust meds prn  

## 2017-06-28 DIAGNOSIS — Z01419 Encounter for gynecological examination (general) (routine) without abnormal findings: Secondary | ICD-10-CM | POA: Diagnosis not present

## 2017-06-28 DIAGNOSIS — Z13820 Encounter for screening for osteoporosis: Secondary | ICD-10-CM | POA: Diagnosis not present

## 2017-06-28 DIAGNOSIS — Z1231 Encounter for screening mammogram for malignant neoplasm of breast: Secondary | ICD-10-CM | POA: Diagnosis not present

## 2017-06-29 ENCOUNTER — Encounter: Payer: Self-pay | Admitting: General Practice

## 2017-06-29 LAB — SEDIMENTATION RATE: Sed Rate: 11 mm/h (ref 0–30)

## 2017-06-29 LAB — CBC WITH DIFFERENTIAL/PLATELET
Basophils Absolute: 53 cells/uL (ref 0–200)
Basophils Relative: 0.6 %
Eosinophils Absolute: 134 cells/uL (ref 15–500)
Eosinophils Relative: 1.5 %
HCT: 34.1 % — ABNORMAL LOW (ref 35.0–45.0)
Hemoglobin: 11.2 g/dL — ABNORMAL LOW (ref 11.7–15.5)
Lymphs Abs: 2207 cells/uL (ref 850–3900)
MCH: 23.8 pg — ABNORMAL LOW (ref 27.0–33.0)
MCHC: 32.8 g/dL (ref 32.0–36.0)
MCV: 72.6 fL — ABNORMAL LOW (ref 80.0–100.0)
MPV: 10.7 fL (ref 7.5–12.5)
Monocytes Relative: 7.7 %
Neutro Abs: 5821 cells/uL (ref 1500–7800)
Neutrophils Relative %: 65.4 %
Platelets: 314 10*3/uL (ref 140–400)
RBC: 4.7 10*6/uL (ref 3.80–5.10)
RDW: 15.3 % — ABNORMAL HIGH (ref 11.0–15.0)
Total Lymphocyte: 24.8 %
WBC mixed population: 685 cells/uL (ref 200–950)
WBC: 8.9 10*3/uL (ref 3.8–10.8)

## 2017-06-29 LAB — BASIC METABOLIC PANEL
BUN/Creatinine Ratio: 15 (calc) (ref 6–22)
BUN: 19 mg/dL (ref 7–25)
CO2: 24 mmol/L (ref 20–32)
Calcium: 9.5 mg/dL (ref 8.6–10.4)
Chloride: 97 mmol/L — ABNORMAL LOW (ref 98–110)
Creat: 1.29 mg/dL — ABNORMAL HIGH (ref 0.50–1.05)
Glucose, Bld: 133 mg/dL — ABNORMAL HIGH (ref 65–99)
Potassium: 3.5 mmol/L (ref 3.5–5.3)
Sodium: 133 mmol/L — ABNORMAL LOW (ref 135–146)

## 2017-06-29 LAB — LIPID PANEL
Cholesterol: 167 mg/dL (ref <200)
HDL: 81 mg/dL (ref >50)
LDL Cholesterol (Calc): 64 mg/dL (calc)
Non-HDL Cholesterol (Calc): 86 mg/dL (calc) (ref <130)
Total CHOL/HDL Ratio: 2.1 (calc) (ref <5.0)
Triglycerides: 134 mg/dL (ref <150)

## 2017-06-29 LAB — C-REACTIVE PROTEIN: CRP: 5.5 mg/L (ref <8.0)

## 2017-06-29 LAB — HEPATIC FUNCTION PANEL
AG Ratio: 1.7 (calc) (ref 1.0–2.5)
ALT: 15 U/L (ref 6–29)
AST: 16 U/L (ref 10–35)
Albumin: 4.3 g/dL (ref 3.6–5.1)
Alkaline phosphatase (APISO): 157 U/L — ABNORMAL HIGH (ref 33–130)
Bilirubin, Direct: 0.1 mg/dL (ref 0.0–0.2)
Globulin: 2.5 g/dL (calc) (ref 1.9–3.7)
Indirect Bilirubin: 0.4 mg/dL (calc) (ref 0.2–1.2)
Total Bilirubin: 0.5 mg/dL (ref 0.2–1.2)
Total Protein: 6.8 g/dL (ref 6.1–8.1)

## 2017-06-29 LAB — HEMOGLOBIN A1C W/OUT EAG: Hgb A1c MFr Bld: 6 % of total Hgb — ABNORMAL HIGH (ref <5.7)

## 2017-06-29 LAB — TEST AUTHORIZATION

## 2017-06-29 LAB — TSH: TSH: 2.35 mIU/L (ref 0.40–4.50)

## 2017-06-29 LAB — GAMMA GT: GGT: 45 U/L (ref 3–70)

## 2017-07-03 ENCOUNTER — Other Ambulatory Visit: Payer: Self-pay | Admitting: Obstetrics and Gynecology

## 2017-07-03 DIAGNOSIS — R928 Other abnormal and inconclusive findings on diagnostic imaging of breast: Secondary | ICD-10-CM

## 2017-07-05 ENCOUNTER — Ambulatory Visit
Admission: RE | Admit: 2017-07-05 | Discharge: 2017-07-05 | Disposition: A | Discharge status: Home / Self Care | Payer: Commercial Managed Care - PPO | Source: Ambulatory Visit | Attending: Obstetrics and Gynecology | Admitting: Obstetrics and Gynecology

## 2017-07-05 ENCOUNTER — Ambulatory Visit: Payer: Commercial Managed Care - PPO

## 2017-07-05 DIAGNOSIS — R928 Other abnormal and inconclusive findings on diagnostic imaging of breast: Secondary | ICD-10-CM | POA: Diagnosis not present

## 2017-07-05 HISTORY — DX: Personal history of irradiation: Z92.3

## 2017-07-05 HISTORY — DX: Personal history of antineoplastic chemotherapy: Z92.21

## 2017-07-25 DIAGNOSIS — M19011 Primary osteoarthritis, right shoulder: Secondary | ICD-10-CM | POA: Diagnosis not present

## 2017-08-02 ENCOUNTER — Other Ambulatory Visit: Payer: Self-pay | Admitting: Neurology

## 2017-08-02 ENCOUNTER — Encounter: Payer: Self-pay | Admitting: Neurology

## 2017-08-02 MED ORDER — ALPRAZOLAM 0.5 MG PO TABS: 0.5000 mg | ORAL_TABLET | Freq: Three times a day (TID) | ORAL | 0 refills | Status: DC | PRN

## 2017-08-17 ENCOUNTER — Other Ambulatory Visit: Payer: Self-pay | Admitting: Family Medicine

## 2017-08-17 ENCOUNTER — Other Ambulatory Visit: Payer: Self-pay | Admitting: Neurology

## 2017-08-21 ENCOUNTER — Ambulatory Visit: Payer: Commercial Managed Care - PPO | Admitting: Neurology

## 2017-08-21 ENCOUNTER — Other Ambulatory Visit: Payer: Self-pay

## 2017-08-21 ENCOUNTER — Encounter: Payer: Self-pay | Admitting: Neurology

## 2017-08-21 VITALS — BP 116/73 | HR 84 | Resp 18 | Ht 66.0 in | Wt 217.0 lb

## 2017-08-21 DIAGNOSIS — C50411 Malignant neoplasm of upper-outer quadrant of right female breast: Secondary | ICD-10-CM

## 2017-08-21 DIAGNOSIS — G2581 Restless legs syndrome: Secondary | ICD-10-CM

## 2017-08-21 DIAGNOSIS — G35 Multiple sclerosis: Secondary | ICD-10-CM

## 2017-08-21 DIAGNOSIS — Z8669 Personal history of other diseases of the nervous system and sense organs: Secondary | ICD-10-CM

## 2017-08-21 DIAGNOSIS — Z79899 Other long term (current) drug therapy: Secondary | ICD-10-CM | POA: Diagnosis not present

## 2017-08-21 DIAGNOSIS — Z170 Estrogen receptor positive status [ER+]: Secondary | ICD-10-CM

## 2017-08-21 DIAGNOSIS — G4733 Obstructive sleep apnea (adult) (pediatric): Secondary | ICD-10-CM

## 2017-08-21 DIAGNOSIS — R5383 Other fatigue: Secondary | ICD-10-CM

## 2017-08-21 MED ORDER — VENLAFAXINE HCL ER 37.5 MG PO CP24: ORAL_CAPSULE | ORAL | 0 refills | Status: DC

## 2017-08-21 MED ORDER — GABAPENTIN 300 MG PO CAPS: ORAL_CAPSULE | ORAL | 3 refills | Status: DC

## 2017-08-21 NOTE — Progress Notes (Signed)
[facility]  PATIENT: Julie Ross DOB: 03/18/1961  REFERRING DOCTOR OR PCP:  Annye Asa SOURCE: patient and records from [facility]  _________________________________   HISTORICAL  CHIEF COMPLAINT:  Chief Complaint  Patient presents with  . Multiple Sclerosis    Remains off of dmt due to slow healing wound.  C/O more cramping bilat calves at night.  Has not started using CPAP yet again but plans to./fim  . Sleep Apnea    HISTORY OF PRESENT ILLNESS:  Julie Ross is a 57 y.o. woman with multiple sclerosis and breast cancer.  Additionally, she has had double procedures due to a knee replacement that got infected.  Update 08/21/2017:    She is reporting more pain in both. This bothers her mostly at night. It is not definitely associated with spasticity. Moving around does not make it much better. Occasions taken at night include Xanax, baclofen and gabapentin. At one time she also took hydrocodone sometimes at night, but not recently.     She has CPAP but has not been using it lately. The PSG in the past showed moderately severe OSA with AHI equals 33.    She also had restless legs and periodic limb movements.          She feels gait is stable. The balance is sometimes off but she has not had any recent falls. She does stumble some. Vision is doing the same. She has a history of optic neuritis on the left. Bladder function is fine.  Mood is fine.  Focus and attention mildly reduced.   From 01/17/2017: MS:   She is currently off a disease modifying therapy. Her MRI July 2017 was stable.The right TKR got infected and she required removal of hardware. She feels it is doing much better. She is still reluctant to go on a DMT due to risk of infection.   Leg aching:   Her entire leg (not just knee) is aching at night and that wakes her up.    Shifting has not helped much.   Getting out of bed and walking does not help but a heating pad does.  This only  bothers her at night.   It does not bother her if she sits a long time.     She takes baclofen , xanax and gabapentin 600 mg at bedtime and sometimes takes another one at 3-4 am.   The gabapentin seems to help but the second dose gives her a hangover until noon.    Ear sensation/lightheaded:   She gets outs of lightheadedness that often start with a stuffed up sensation in her right ear.   Mood/cognitive:    She notes depression and some anxiety.   She is apathetic.       She notes reduced focus and attention and sometime mild memory issues.   Her nose bled a lot with Flonase.   She already takes Zyrtec  Fatigue/sleep:   She has fatigue that is both physical and mental. The fatigue is present when she wakes up but worsens as the day goes on. Of note, she has severe OSA (AHI = 33) diagnosed 11/20/2013.   She was placed on Auto-PAP .  She recently tried the Dreamwear mask and feels it is easier to wear than other masks.    She also has moderate RLS with some impact on sleep, helped by nighttime gabapentin 300 mg and baclofen and xanax at bedtime.      During the day she  is very sleepy but then has trouble falling asleep at night.      Provigil made her jittery.   Phentermine helped the sleepiness but she had mild stomach upset.  Gait/strength/sensation:   Gait issues are mild and mostly orthopedic in nature .   Her legs fatigue easily.   She occasioanally trips over her feet and she broke her 5th toe once.    She denies any leg numbness.  Vision:   She had left optic neuritis in 2012. Left visual acuity improved but not to baseline.   Colors are desaturated on the left, esp when tired  Bladder:   Bladder function is fine.    MS History:    She was diagnosed with MS in 1999 after presenting with optic neuritis.   She had a spinal tap, MRI and angiogram.   Initially, the diagnosis was uncertain but she had changes in the MRI over time leading to a diagnosis of MS a few years later.    Initially, she was  placed on Copaxone, then switched to Avonex due to skin reactions.   She then switched to Tysabri when she had some mild breakthrough disease. She tolerated Tysabri very well and her MS did well.  However, she was JCV antibody positive so she stopped in 2012.   She did not restart Avonex as she did not want to go back to an injection.    Her last exacerbation was optic neuritis in 2012 treated with steroids. She proved but did not get back to baseline.  This exacerbation occurred off any DMT.  She tried Gilenya in 2013. She had trouble tolerating Gilenya and discontinued shortly after starting it. She was going to start Tecfidera but was diagnosed with breast cancer in 2013 and opted not to start at that time. She received chemotherapy including Taxotere, carboplatin, Herceptin in 2013 and 2014. An MRI of the brain in 2015 showed only mild MS progression compared to her previous one in 2011.      REVIEW OF SYSTEMS: Constitutional: No fevers, chills, sweats, or change in appetite.  Notes fatigue Eyes: No visual changes, double vision, eye pain Ear, nose and throat: No hearing loss, ear pain, nasal congestion, sore throat.  Recent URI Cardiovascular: No chest pain, palpitations Respiratory: No shortness of breath at rest or with exertion.   No wheezes.  Has OSA GastrointestinaI: No nausea, vomiting, diarrhea, abdominal pain, fecal incontinence Genitourinary: No dysuria, urinary retention or frequency.  She has 1 times nocturia most nights Musculoskeletal: No neck pain, back pain.  She has bilateral knee pain Integumentary: No rash, pruritus, skin lesions Neurological: as above Psychiatric: No depression at this time.  Some anxiety Endocrine: No palpitations, diaphoresis, change in appetite, change in weigh or increased thirst Hematologic/Lymphatic: No anemia, purpura, petechiae. Allergic/Immunologic: No itchy/runny eyes, nasal congestion, recent allergic reactions, rashes  ALLERGIES: Allergies    Allergen Reactions  . Bee Venom Anaphylaxis    HOME MEDICATIONS:  Current Outpatient Medications:  .  ALPRAZolam (XANAX) 0.5 MG tablet, Take 1 tablet (0.5 mg total) by mouth 3 (three) times daily as needed. for anxiety, Disp: 270 tablet, Rfl: 0 .  amLODipine (NORVASC) 10 MG tablet, TAKE 1 TABLET DAILY, Disp: 90 tablet, Rfl: 1 .  atorvastatin (LIPITOR) 20 MG tablet, TAKE 1 TABLET DAILY, Disp: 90 tablet, Rfl: 1 .  baclofen (LIORESAL) 10 MG tablet, TAKE 1 TABLET THREE TIMES A DAY AS NEEDED FOR MUSCLE SPASMS, Disp: 270 tablet, Rfl: 3 .  buPROPion (WELLBUTRIN XL)  150 MG 24 hr tablet, TAKE 1 TABLET DAILY, Disp: 90 tablet, Rfl: 1 .  CALCIUM-MAGNESIUM PO, Take 1 tablet by mouth daily., Disp: , Rfl:  .  cetirizine (ZYRTEC) 10 MG tablet, Take 1 tablet (10 mg total) by mouth daily., Disp: 30 tablet, Rfl: 11 .  gabapentin (NEURONTIN) 300 MG capsule, Take 1-2 pills twice a day, Disp: 360 capsule, Rfl: 3 .  ibuprofen (ADVIL,MOTRIN) 600 MG tablet, , Disp: , Rfl:  .  letrozole (FEMARA) 2.5 MG tablet, Take 1 tablet (2.5 mg total) by mouth daily., Disp: 90 tablet, Rfl: 3 .  levothyroxine (SYNTHROID, LEVOTHROID) 88 MCG tablet, TAKE 1 TABLET DAILY, Disp: 90 tablet, Rfl: 1 .  losartan-hydrochlorothiazide (HYZAAR) 100-25 MG tablet, TAKE 1 TABLET EVERY MORNING BEFORE BREAKFAST, Disp: 90 tablet, Rfl: 1 .  Multiple Vitamins-Minerals (MULTIVITAMINS THER. W/MINERALS) TABS, Take 1 tablet by mouth daily., Disp: , Rfl:  .  omeprazole (PRILOSEC) 40 MG capsule, TAKE 1 CAPSULE DAILY, Disp: 90 capsule, Rfl: 1 .  potassium chloride SA (K-DUR,KLOR-CON) 20 MEQ tablet, TAKE 1 TABLET DAILY, Disp: 90 tablet, Rfl: 1 .  Probiotic Product (PROBIOTIC DAILY PO), Take 1 capsule by mouth daily. , Disp: , Rfl:  .  venlafaxine XR (EFFEXOR-XR) 37.5 MG 24 hr capsule, Take 2 po daily x 2 weeks, then take 1 po daily x 2 weeks, Disp: 42 capsule, Rfl: 0 .  vitamin E 400 UNIT capsule, Take 800 Units by mouth daily., Disp: , Rfl:   PAST MEDICAL  HISTORY: Past Medical History:  Diagnosis Date  . Anemia    h/o - ? bld. transfusion - more recent- ?2013  . Anxiety    uses xanax mainly for sleep  . Arthritis    knees   . Breast cancer ([place]) 11/2011   lul/ER+PR+, x1 lymph node   . Cancer (HCC)    squamous cell skin cancer x7  . Depression   . GERD (gastroesophageal reflux disease)   . H/O bone density study 03/2011  . H/O colonoscopy   . H/O echocardiogram    last one 02/2013, due to chemotherapy  . Heart burn   . Hx of radiation therapy 04/17/12- 06/04/12   left chest wall, axilla, L supraclavicular fossa 4500 cGy, left mastectomy scar/chest wall 5940 cGy  . Hypertension   . Hypothyroidism   . Multiple sclerosis ([place])   . Neuromuscular disorder ([name])    MS TX WITH CYMBALTA   . Personal history of chemotherapy   . Personal history of radiation therapy   . Sleep apnea    MILD SLEEP APNEA, NO (Needed) MACHINE  6 YRS AGO HPT [facility]   . Wears glasses     PAST SURGICAL HISTORY: Past Surgical History:  Procedure Laterality Date  . CESAREAN SECTION     X2   . COLONOSCOPY    . ESOPHAGOGASTRODUODENOSCOPY    . FLEXIBLE SIGMOIDOSCOPY    . I&D KNEE WITH POLY EXCHANGE  03/02/2012   Procedure: IRRIGATION AND DEBRIDEMENT KNEE WITH POLY EXCHANGE;  Surgeon: Alta Corning, MD;  Location: [facility];  Service: Orthopedics;  Laterality: Left;  I&D of total knee with Possible Poly Exchange   . JOINT REPLACEMENT  Feb 2013   left knee- multiple surgeries   . KNEE ARTHROSCOPY     LT KNEE 04/2011  . KNEE ARTHROSCOPY  82   rt  . MASTECTOMY    . MASTECTOMY W/ SENTINEL NODE BIOPSY  12/19/2011   Procedure: MASTECTOMY WITH SENTINEL LYMPH NODE BIOPSY;  Surgeon: Darrick Meigs  Fanny Bien, MD;  Location: [facility];  Service: General;  Laterality: Left;  left breast and left axilla   . MOHS SURGERY     right face  . NO PAST SURGERIES     BLADDER SLING  4-5 YRS AGO   . PORT-A-CATH REMOVAL  03/06/2012   Procedure: REMOVAL PORT-A-CATH;  Surgeon: Odis Hollingshead, MD;  Location: [facility];  Service: General;  Laterality: N/A;  . PORT-A-CATH REMOVAL Right 08/22/2012   Procedure: REMOVAL PORT-A-CATH;  Surgeon: Edward Jolly, MD;  Location: [facility];  Service: General;  Laterality: Right;  . PORTACATH PLACEMENT  01/16/2012   Procedure: INSERTION PORT-A-CATH;  Surgeon: Haywood Lasso, MD;  Location: [facility];  Service: General;  Laterality: Right;  [place] Cath Placement   . PORTACATH PLACEMENT  05/01/2012   Procedure: INSERTION PORT-A-CATH;  Surgeon: Haywood Lasso, MD;  Location: [facility];  Service: General;  Laterality: Right;  right internal jugular port-a-cath insertion  . TEE WITHOUT CARDIOVERSION  03/06/2012   Procedure: TRANSESOPHAGEAL ECHOCARDIOGRAM (TEE);  Surgeon: Josue Hector, MD;  Location: [facility];  Service: Cardiovascular;  Laterality: N/A;  Rm. [id number]  . TONSILLECTOMY    . TOTAL KNEE ARTHROPLASTY  08/15/2011   Procedure: TOTAL KNEE ARTHROPLASTY; lft Surgeon: Alta Corning, MD;  Location: [facility];  Service: Orthopedics;  Laterality: Left;  RIGHT KNEE CORTIZONE INJECTION  . TOTAL KNEE ARTHROPLASTY Left 08/18/2012   Procedure:  Irrigation and debridement of LEFT total knee;  Removal of total knee parts; Implant of spacers;  Surgeon: Kerin Salen, MD;  Location: [facility];  Service: Orthopedics;  Laterality: Left;  . TOTAL KNEE ARTHROPLASTY Right 08/12/2013  . TOTAL KNEE ARTHROPLASTY Right 08/12/2013   Procedure: RIGHT TOTAL KNEE ARTHROPLASTY;  Surgeon: Kerin Salen, MD;  Location: [facility];  Service: Orthopedics;  Laterality: Right;  . TOTAL KNEE REVISION Left 04/19/2013   Procedure: TOTAL KNEE REVISION AND REMOVAL CEMENT SPACER;  Surgeon: Kerin Salen, MD;  Location: [facility];  Service: Orthopedics;  Laterality: Left;  . TUMOR REMOVAL     FROM [facility] AGE 62    FAMILY HISTORY: Family History  Problem Relation Age of Onset  . Breast cancer Paternal Aunt        dx in her 39s  . Heart disease Maternal  Grandfather   . Lung cancer Maternal Uncle        smoker  . Breast cancer Paternal Grandmother        dx in her 53s  . Ovarian cancer Paternal Aunt        dx in her 70s  . Arthritis Mother   . Hypertension Mother   . Heart disease Mother        CHF  . Dementia Father   . Diabetes type II Father     SOCIAL HISTORY:  Social History   Socioeconomic History  . Marital status: Married    Spouse name: Not on file  . Number of children: 2  . Years of education: Not on file  . Highest education level: Not on file  Social Needs  . Financial resource strain: Not on file  . Food insecurity - worry: Not on file  . Food insecurity - inability: Not on file  . Transportation needs - medical: Not on file  . Transportation needs - non-medical: Not on file  Occupational History    Employer: [employer]   Tobacco Use  . Smoking status: Never Smoker  .  Smokeless tobacco: Never Used  Substance and Sexual Activity  . Alcohol use: No  . Drug use: No  . Sexual activity: Yes  Other Topics Concern  . Not on file  Social History Narrative  . Not on file     PHYSICAL EXAM  Vitals:   08/21/17 1051  BP: 116/73  Pulse: 84  Resp: 18  Weight: 217 lb (98.4 kg)  Height: 5\' 6"  (1.676 m)    Body mass index is 35.02 kg/m.   General: The patient is well-developed and well-nourished and in no acute distress.      Neurologic Exam  Mental status: The patient is alert and oriented x 3 at the time of the examination. The patient has apparent normal recent and remote memory, with an apparently normal attention span and concentration ability.   Speech is normal.  Cranial nerves: Extraocular movements are full.  Facial strength and sensation is normal. The tongue is midline, and the patient has symmetric elevation of the soft palate. No obvious hearing deficits are noted.  Motor:  Muscle bulk is normal.   Tone is normal. Strength is  5 / 5 in all 4 extremities.   Sensory: She has intact  sensation to touch and vibration in the arms or legs..  Coordination: Finger-nose-finger is performed well. Heel-to-shin is mildly off..  Gait and station: Station is normal.   The gait is arthritic. Tandem gait is wide. The Romberg is negative.  Reflexes: Deep tendon reflexes are symmetric and normal bilaterally in arms.   Not tested at knees due to pain     DIAGNOSTIC DATA (LABS, IMAGING, TESTING) - I reviewed patient records, labs, notes, testing and imaging myself where available.  Lab Results  Component Value Date   WBC 8.9 06/26/2017   HGB 11.2 (L) 06/26/2017   HCT 34.1 (L) 06/26/2017   MCV 72.6 (L) 06/26/2017   PLT 314 06/26/2017      Component Value Date/Time   NA 133 (L) 06/26/2017 1603   NA 136 (A) 09/15/2016   NA 138 08/18/2014 1551   K 3.5 06/26/2017 1603   K 3.7 08/18/2014 1551   CL 97 (L) 06/26/2017 1603   CL 100 11/30/2012 1029   CO2 24 06/26/2017 1603   CO2 27 08/18/2014 1551   GLUCOSE 133 (H) 06/26/2017 1603   GLUCOSE 117 08/18/2014 1551   GLUCOSE 91 11/30/2012 1029   BUN 19 06/26/2017 1603   BUN 17.2 08/18/2014 1551   CREATININE 1.29 (H) 06/26/2017 1603   CREATININE 1.3 (H) 08/18/2014 1551   CALCIUM 9.5 06/26/2017 1603   CALCIUM 9.1 08/18/2014 1551   PROT 6.8 06/26/2017 1603   PROT 6.8 10/15/2014 1420   PROT 7.1 08/18/2014 1551   ALBUMIN 4.5 12/28/2016 1139   ALBUMIN 4.5 10/15/2014 1420   ALBUMIN 4.0 08/18/2014 1551   AST 16 06/26/2017 1603   AST 19 08/18/2014 1551   ALT 15 06/26/2017 1603   ALT 21 08/18/2014 1551   ALKPHOS 143 (H) 12/28/2016 1139   ALKPHOS 137 08/18/2014 1551   BILITOT 0.5 06/26/2017 1603   BILITOT 0.6 10/15/2014 1420   BILITOT 0.46 08/18/2014 1551   GFRNONAA 47 (L) 12/09/2014 1110   GFRAA 55 (L) 12/09/2014 1110       ASSESSMENT AND PLAN  Relapsing remitting multiple sclerosis (HCC)  Obstructive sleep apnea  Malignant neoplasm of upper-outer quadrant of right breast in female, estrogen receptor positive  (HCC)  High risk medication use  Other fatigue  Restless leg syndrome  History of optic neuritis   1.  After the next visit, we will recheck an MRI of the brain to make sure that she does not have any subclinical progression.   If she has any new lesions, consider Tecfidera or Aubagio as they are likely to have the least effect on the immune system as far as risk of infection.  2.  She is going to try to get back on nightly CPAP.    3.  I will stop the Effexor as it is less likely to worsen restless leg syndrome. Taper over a month.   Continue Xanax and gabapentin and baclofen at night.   4.  She will return to see me in 6 months or sooner if she has new or worsening neurologic symptoms.   Lalla Laham A. Felecia Shelling, MD, PhD 08/24/1060, 69:48 AM Certified in Neurology, Clinical Neurophysiology, Sleep Medicine, Pain Medicine and Neuroimaging  [facility] [address], [place], [place] [address] [phone]

## 2017-08-22 ENCOUNTER — Telehealth: Payer: Self-pay | Admitting: *Deleted

## 2017-08-22 NOTE — Telephone Encounter (Signed)
PA for Gabapentin 300mg  capsules #360/90 completed and faxed to [facility], fax# [phone].  Dx: Neuropathy (G62.9), RLS (G25.81).  The reason she needs #120 of the 300 capsules per month, instead of #60 of the 600mg  capsules, is that she does not always require 600mg  bid--sometimes symptoms only require 300mg  bid.  So, she can take the lowest dose possible depending on severity of her sx./fim

## 2017-08-26 ENCOUNTER — Other Ambulatory Visit: Payer: Self-pay | Admitting: Family Medicine

## 2017-09-06 ENCOUNTER — Encounter: Payer: Self-pay | Admitting: Family Medicine

## 2017-09-07 ENCOUNTER — Other Ambulatory Visit: Payer: Self-pay | Admitting: Emergency Medicine

## 2017-09-07 DIAGNOSIS — H524 Presbyopia: Secondary | ICD-10-CM | POA: Diagnosis not present

## 2017-09-07 DIAGNOSIS — H5213 Myopia, bilateral: Secondary | ICD-10-CM | POA: Diagnosis not present

## 2017-09-07 DIAGNOSIS — H52223 Regular astigmatism, bilateral: Secondary | ICD-10-CM | POA: Diagnosis not present

## 2017-09-07 MED ORDER — BUPROPION HCL ER (XL) 150 MG PO TB24: 150.0000 mg | ORAL_TABLET | Freq: Every day | ORAL | 1 refills | Status: DC

## 2017-09-22 ENCOUNTER — Other Ambulatory Visit: Payer: Self-pay | Admitting: Neurology

## 2017-09-28 ENCOUNTER — Other Ambulatory Visit: Payer: Self-pay | Admitting: Family Medicine

## 2017-10-09 ENCOUNTER — Other Ambulatory Visit: Payer: Self-pay | Admitting: Emergency Medicine

## 2017-10-09 MED ORDER — BUPROPION HCL ER (XL) 150 MG PO TB24: 150.0000 mg | ORAL_TABLET | Freq: Every day | ORAL | 1 refills | Status: DC

## 2017-10-18 NOTE — Telephone Encounter (Signed)
PA for Gabapentin was denied.  An appeal was filed.  Fax received from [facility].  Denial was overturned on appeal, and Gabapentin has been approved for dates 09/18/17 thru 10/18/18.  Case ID: [id number]

## 2017-10-23 DIAGNOSIS — M545 Low back pain: Secondary | ICD-10-CM | POA: Diagnosis not present

## 2017-10-23 DIAGNOSIS — M47816 Spondylosis without myelopathy or radiculopathy, lumbar region: Secondary | ICD-10-CM | POA: Diagnosis not present

## 2017-11-22 DIAGNOSIS — M47816 Spondylosis without myelopathy or radiculopathy, lumbar region: Secondary | ICD-10-CM | POA: Diagnosis not present

## 2017-11-22 DIAGNOSIS — M545 Low back pain: Secondary | ICD-10-CM | POA: Diagnosis not present

## 2017-11-30 ENCOUNTER — Encounter: Payer: Self-pay | Admitting: Neurology

## 2017-12-02 ENCOUNTER — Other Ambulatory Visit: Payer: Self-pay | Admitting: Family Medicine

## 2017-12-18 ENCOUNTER — Other Ambulatory Visit: Payer: Self-pay | Admitting: Family Medicine

## 2017-12-25 ENCOUNTER — Encounter: Payer: Commercial Managed Care - PPO | Admitting: Family Medicine

## 2017-12-28 ENCOUNTER — Ambulatory Visit: Payer: Commercial Managed Care - PPO | Admitting: Family Medicine

## 2018-01-05 ENCOUNTER — Encounter: Payer: Self-pay | Admitting: Family Medicine

## 2018-01-05 MED ORDER — EPINEPHRINE 0.3 MG/0.3ML IJ SOAJ: 0.3000 mg | Freq: Once | INTRAMUSCULAR | 2 refills | Status: AC

## 2018-01-24 DIAGNOSIS — M545 Low back pain: Secondary | ICD-10-CM | POA: Diagnosis not present

## 2018-01-24 DIAGNOSIS — Z96652 Presence of left artificial knee joint: Secondary | ICD-10-CM | POA: Diagnosis not present

## 2018-01-24 DIAGNOSIS — M25562 Pain in left knee: Secondary | ICD-10-CM | POA: Diagnosis not present

## 2018-01-24 DIAGNOSIS — Z96651 Presence of right artificial knee joint: Secondary | ICD-10-CM | POA: Diagnosis not present

## 2018-02-02 ENCOUNTER — Other Ambulatory Visit: Payer: Self-pay | Admitting: Family Medicine

## 2018-02-14 DIAGNOSIS — M545 Low back pain: Secondary | ICD-10-CM | POA: Diagnosis not present

## 2018-02-21 ENCOUNTER — Ambulatory Visit: Payer: Commercial Managed Care - PPO | Admitting: Neurology

## 2018-02-23 DIAGNOSIS — M545 Low back pain: Secondary | ICD-10-CM | POA: Diagnosis not present

## 2018-02-24 ENCOUNTER — Other Ambulatory Visit: Payer: Self-pay | Admitting: Family Medicine

## 2018-02-27 DIAGNOSIS — M545 Low back pain: Secondary | ICD-10-CM | POA: Diagnosis not present

## 2018-03-01 DIAGNOSIS — M545 Low back pain: Secondary | ICD-10-CM | POA: Diagnosis not present

## 2018-03-05 MED ORDER — ALPRAZOLAM 0.5 MG PO TABS: 0.5000 mg | ORAL_TABLET | Freq: Three times a day (TID) | ORAL | 0 refills | Status: DC | PRN

## 2018-03-08 DIAGNOSIS — M545 Low back pain: Secondary | ICD-10-CM | POA: Diagnosis not present

## 2018-03-15 DIAGNOSIS — M545 Low back pain: Secondary | ICD-10-CM | POA: Diagnosis not present

## 2018-03-20 DIAGNOSIS — M545 Low back pain: Secondary | ICD-10-CM | POA: Diagnosis not present

## 2018-03-21 ENCOUNTER — Ambulatory Visit: Payer: Commercial Managed Care - PPO | Admitting: Gastroenterology

## 2018-03-22 ENCOUNTER — Telehealth: Payer: Self-pay | Admitting: Emergency Medicine

## 2018-03-22 NOTE — Telephone Encounter (Signed)
Copied from [facility] [phone]. Topic: Inquiry >> Mar 22, 2018  1:43 PM Valla Leaver wrote: Reason for CRM: Patient would like to transfer from [facility] to [facility]. Please call to confirm approval or denial. >> Mar 22, 2018  2:02 PM Katina Dung, [place] wrote: Please advise >> Mar 22, 2018  3:00 PM Midge Minium, MD wrote: Madaline Brilliant to transfer Please advise if transfer is okay

## 2018-03-23 NOTE — Telephone Encounter (Signed)
Ok with me 

## 2018-03-27 DIAGNOSIS — M545 Low back pain: Secondary | ICD-10-CM | POA: Diagnosis not present

## 2018-03-29 DIAGNOSIS — M545 Low back pain: Secondary | ICD-10-CM | POA: Diagnosis not present

## 2018-03-29 NOTE — Telephone Encounter (Signed)
I called and spoke to patient, patient transfer approved. Patient scheduled to see Dr. Zigmund Daniel on 04/02/18.

## 2018-03-30 ENCOUNTER — Encounter: Payer: Self-pay | Admitting: Neurology

## 2018-03-30 ENCOUNTER — Ambulatory Visit: Payer: Commercial Managed Care - PPO | Admitting: Neurology

## 2018-03-30 ENCOUNTER — Other Ambulatory Visit: Payer: Self-pay

## 2018-03-30 VITALS — BP 128/76 | HR 68 | Resp 18 | Ht 66.0 in | Wt 232.5 lb

## 2018-03-30 DIAGNOSIS — G4721 Circadian rhythm sleep disorder, delayed sleep phase type: Secondary | ICD-10-CM

## 2018-03-30 DIAGNOSIS — M533 Sacrococcygeal disorders, not elsewhere classified: Secondary | ICD-10-CM | POA: Diagnosis not present

## 2018-03-30 DIAGNOSIS — G4733 Obstructive sleep apnea (adult) (pediatric): Secondary | ICD-10-CM | POA: Diagnosis not present

## 2018-03-30 DIAGNOSIS — G8929 Other chronic pain: Secondary | ICD-10-CM

## 2018-03-30 DIAGNOSIS — G35 Multiple sclerosis: Secondary | ICD-10-CM | POA: Diagnosis not present

## 2018-03-30 MED ORDER — ETODOLAC 400 MG PO TABS: 400.0000 mg | ORAL_TABLET | Freq: Two times a day (BID) | ORAL | 5 refills | Status: DC

## 2018-03-30 NOTE — Patient Instructions (Addendum)
Currently, you often going to bed around 2 AM.  In order to advance your sleep phase try the following:  Slowly advance the time to go to bed by 15 minutes every couple nights.  So you will spend a couple nights at 1:45 AM and a couple nights at 1:30 am and so on until you get to midnight.  In order to reinforce your sleep time, take melatonin 5 mg 90 minutes before you plan to go to sleep and look at bright lights in the morning.   Consider a 10000 Lux light therapy lamp

## 2018-03-30 NOTE — Progress Notes (Signed)
[facility]  PATIENT: COLINE CALKIN DOB: 10/30/1960  REFERRING DOCTOR OR PCP:  Annye Asa SOURCE: patient and records from [facility]  _________________________________   HISTORICAL  CHIEF COMPLAINT:  Chief Complaint  Patient presents with  . Multiple Sclerosis    Remains off of a dmt due to slow healing wound (ortho--knee).  Sts. she is trying to be more compliant with CPAP and would like to discuss a new mask (Dreamwear full face).  Sts. RLS sx. improved some after she stopped Effexor/fim  . Sleep Apnea    HISTORY OF PRESENT ILLNESS:  Maicee Ullman is a 57 y.o. woman with multiple sclerosis and breast cancer.    Update 03/30/2018 She has not been on any DMT since chemotherapy for breast cancer (treated with surgery, chemotherapy and RadRx).    She feels her MS has been stable.   She notes her gait is poor but her knees (has had bilateral TKR complicated by infections).    She does do PT and exercises twice weekly.    She has no recent falls.   Strength in legs is normal and she has no numbness.   Bladder is fine.   Vision is stable (wears correction)  She notes short term memory is worse and she does not always remember what she read.    She has some verbal fluency issues and flips words around at times.   This frustrates her.    She has mild depression that is stable.   Late at night if in pain, she gets more depressed.   She stays up late until 2 am and gets up at 9 am and often takes a late morning nap.   She feels she could sleep all day.    She has moderately severe OSA and needs a FF mask (mouth breather).   She had difficulty with the standard mask.   She would like to try the Dreamwear FF mask.   She gets LBP that stays axial.    She denies sciatica.   Worst pain is in the left SI/piriformis region.  Pain is worse with standing.   She has L5S1 facet hypertrophy but facet joint injections did not help.   Ibuprofen has not helped much.      Update 08/21/2017:    She is reporting more pain in both. This bothers her mostly at night. It is not definitely associated with spasticity. Moving around does not make it much better. Occasions taken at night include Xanax, baclofen and gabapentin. At one time she also took hydrocodone sometimes at night, but not recently.     She has CPAP but has not been using it lately. The PSG in the past showed moderately severe OSA with AHI equals 33.    She also had restless legs and periodic limb movements.          She feels gait is stable. The balance is sometimes off but she has not had any recent falls. She does stumble some. Vision is doing the same. She has a history of optic neuritis on the left. Bladder function is fine.  Mood is fine.  Focus and attention mildly reduced.   From 01/17/2017: MS:   She is currently off a disease modifying therapy. Her MRI July 2017 was stable.The right TKR got infected and she required removal of hardware. She feels it is doing much better. She is still reluctant to go on a DMT due to risk of infection.   Leg  aching:   Her entire leg (not just knee) is aching at night and that wakes her up.    Shifting has not helped much.   Getting out of bed and walking does not help but a heating pad does.  This only bothers her at night.   It does not bother her if she sits a long time.     She takes baclofen , xanax and gabapentin 600 mg at bedtime and sometimes takes another one at 3-4 am.   The gabapentin seems to help but the second dose gives her a hangover until noon.    Ear sensation/lightheaded:   She gets outs of lightheadedness that often start with a stuffed up sensation in her right ear.   Mood/cognitive:    She notes depression and some anxiety.   She is apathetic.       She notes reduced focus and attention and sometime mild memory issues.   Her nose bled a lot with Flonase.   She already takes Zyrtec  Fatigue/sleep:   She has fatigue that is both physical and mental.  The fatigue is present when she wakes up but worsens as the day goes on. Of note, she has severe OSA (AHI = 33) diagnosed 11/20/2013.   She was placed on Auto-PAP .  She recently tried the Dreamwear mask and feels it is easier to wear than other masks.    She also has moderate RLS with some impact on sleep, helped by nighttime gabapentin 300 mg and baclofen and xanax at bedtime.      During the day she is very sleepy but then has trouble falling asleep at night.      Provigil made her jittery.   Phentermine helped the sleepiness but she had mild stomach upset.  Gait/strength/sensation:   Gait issues are mild and mostly orthopedic in nature .   Her legs fatigue easily.   She occasioanally trips over her feet and she broke her 5th toe once.    She denies any leg numbness.  Vision:   She had left optic neuritis in 2012. Left visual acuity improved but not to baseline.   Colors are desaturated on the left, esp when tired  Bladder:   Bladder function is fine.    MS History:    She was diagnosed with MS in 1999 after presenting with optic neuritis.   She had a spinal tap, MRI and angiogram.   Initially, the diagnosis was uncertain but she had changes in the MRI over time leading to a diagnosis of MS a few years later.    Initially, she was placed on Copaxone, then switched to Avonex due to skin reactions.   She then switched to Tysabri when she had some mild breakthrough disease. She tolerated Tysabri very well and her MS did well.  However, she was JCV antibody positive so she stopped in 2012.   She did not restart Avonex as she did not want to go back to an injection.    Her last exacerbation was optic neuritis in 2012 treated with steroids. She proved but did not get back to baseline.  This exacerbation occurred off any DMT.  She tried Gilenya in 2013. She had trouble tolerating Gilenya and discontinued shortly after starting it. She was going to start Tecfidera but was diagnosed with breast cancer in 2013 and  opted not to start at that time. She received chemotherapy including Taxotere, carboplatin, Herceptin in 2013 and 2014. An MRI of the brain  in 2015 showed only mild MS progression compared to her previous one in 2011.      REVIEW OF SYSTEMS: Constitutional: No fevers, chills, sweats, or change in appetite.  Notes fatigue Eyes: No visual changes, double vision, eye pain Ear, nose and throat: No hearing loss, ear pain, nasal congestion, sore throat.  Recent URI Cardiovascular: No chest pain, palpitations Respiratory: No shortness of breath at rest or with exertion.   No wheezes.  Has OSA GastrointestinaI: No nausea, vomiting, diarrhea, abdominal pain, fecal incontinence Genitourinary: No dysuria, urinary retention or frequency.  She has 1 times nocturia most nights Musculoskeletal: No neck pain, back pain.  She has bilateral knee pain Integumentary: No rash, pruritus, skin lesions Neurological: as above Psychiatric: No depression at this time.  Some anxiety Endocrine: No palpitations, diaphoresis, change in appetite, change in weigh or increased thirst Hematologic/Lymphatic: No anemia, purpura, petechiae. Allergic/Immunologic: No itchy/runny eyes, nasal congestion, recent allergic reactions, rashes  ALLERGIES: Allergies  Allergen Reactions  . Bee Venom Anaphylaxis    HOME MEDICATIONS:  Current Outpatient Medications:  .  ALPRAZolam (XANAX) 0.5 MG tablet, Take 1 tablet (0.5 mg total) by mouth 3 (three) times daily as needed. for anxiety, Disp: 270 tablet, Rfl: 0 .  amLODipine (NORVASC) 10 MG tablet, TAKE 1 TABLET DAILY, Disp: 90 tablet, Rfl: 0 .  atorvastatin (LIPITOR) 20 MG tablet, TAKE 1 TABLET DAILY, Disp: 90 tablet, Rfl: 1 .  baclofen (LIORESAL) 10 MG tablet, TAKE 1 TABLET THREE TIMES A DAY AS NEEDED FOR MUSCLE SPASMS, Disp: 270 tablet, Rfl: 3 .  buPROPion (WELLBUTRIN XL) 150 MG 24 hr tablet, Take 1 tablet (150 mg total) by mouth daily., Disp: 90 tablet, Rfl: 1 .   CALCIUM-MAGNESIUM PO, Take 1 tablet by mouth daily., Disp: , Rfl:  .  cetirizine (ZYRTEC) 10 MG tablet, TAKE 1 TABLET(10 MG) BY MOUTH DAILY, Disp: 30 tablet, Rfl: 6 .  gabapentin (NEURONTIN) 300 MG capsule, Take 1-2 pills twice a day, Disp: 360 capsule, Rfl: 3 .  ibuprofen (ADVIL,MOTRIN) 600 MG tablet, , Disp: , Rfl:  .  letrozole (FEMARA) 2.5 MG tablet, Take 1 tablet (2.5 mg total) by mouth daily., Disp: 90 tablet, Rfl: 3 .  levothyroxine (SYNTHROID, LEVOTHROID) 88 MCG tablet, TAKE 1 TABLET DAILY, Disp: 90 tablet, Rfl: 1 .  losartan-hydrochlorothiazide (HYZAAR) 100-25 MG tablet, TAKE 1 TABLET EVERY MORNING BEFORE BREAKFAST, Disp: 90 tablet, Rfl: 1 .  Multiple Vitamins-Minerals (MULTIVITAMINS THER. W/MINERALS) TABS, Take 1 tablet by mouth daily., Disp: , Rfl:  .  omeprazole (PRILOSEC) 40 MG capsule, TAKE 1 CAPSULE DAILY, Disp: 90 capsule, Rfl: 0 .  potassium chloride SA (K-DUR,KLOR-CON) 20 MEQ tablet, TAKE 1 TABLET DAILY, Disp: 90 tablet, Rfl: 1 .  etodolac (LODINE) 400 MG tablet, Take 1 tablet (400 mg total) by mouth 2 (two) times daily., Disp: 60 tablet, Rfl: 5 .  Probiotic Product (PROBIOTIC DAILY PO), Take 1 capsule by mouth daily. , Disp: , Rfl:  .  vitamin E 400 UNIT capsule, Take 800 Units by mouth daily., Disp: , Rfl:   PAST MEDICAL HISTORY: Past Medical History:  Diagnosis Date  . Anemia    h/o - ? bld. transfusion - more recent- ?2013  . Anxiety    uses xanax mainly for sleep  . Arthritis    knees   . Breast cancer ([place]) 11/2011   lul/ER+PR+, x1 lymph node   . Cancer (HCC)    squamous cell skin cancer x7  . Depression   . GERD (gastroesophageal  reflux disease)   . H/O bone density study 03/2011  . H/O colonoscopy   . H/O echocardiogram    last one 02/2013, due to chemotherapy  . Heart burn   . Hx of radiation therapy 04/17/12- 06/04/12   left chest wall, axilla, L supraclavicular fossa 4500 cGy, left mastectomy scar/chest wall 5940 cGy  . Hypertension   . Hypothyroidism     . Multiple sclerosis ([place])   . Neuromuscular disorder ([place])    MS TX WITH CYMBALTA   . Personal history of chemotherapy   . Personal history of radiation therapy   . Sleep apnea    MILD SLEEP APNEA, NO (Needed) MACHINE  6 YRS AGO HPT [facility]   . Wears glasses     PAST SURGICAL HISTORY: Past Surgical History:  Procedure Laterality Date  . CESAREAN SECTION     X2   . COLONOSCOPY    . ESOPHAGOGASTRODUODENOSCOPY    . FLEXIBLE SIGMOIDOSCOPY    . I&D KNEE WITH POLY EXCHANGE  03/02/2012   Procedure: IRRIGATION AND DEBRIDEMENT KNEE WITH POLY EXCHANGE;  Surgeon: Alta Corning, MD;  Location: [place];  Service: Orthopedics;  Laterality: Left;  I&D of total knee with Possible Poly Exchange   . JOINT REPLACEMENT  Feb 2013   left knee- multiple surgeries   . KNEE ARTHROSCOPY     LT KNEE 04/2011  . KNEE ARTHROSCOPY  82   rt  . MASTECTOMY    . MASTECTOMY W/ SENTINEL NODE BIOPSY  12/19/2011   Procedure: MASTECTOMY WITH SENTINEL LYMPH NODE BIOPSY;  Surgeon: Haywood Lasso, MD;  Location: [place];  Service: General;  Laterality: Left;  left breast and left axilla   . MOHS SURGERY     right face  . NO PAST SURGERIES     BLADDER SLING  4-5 YRS AGO   . PORT-A-CATH REMOVAL  03/06/2012   Procedure: REMOVAL PORT-A-CATH;  Surgeon: Odis Hollingshead, MD;  Location: [place];  Service: General;  Laterality: N/A;  . PORT-A-CATH REMOVAL Right 08/22/2012   Procedure: REMOVAL PORT-A-CATH;  Surgeon: Edward Jolly, MD;  Location: [place];  Service: General;  Laterality: Right;  . PORTACATH PLACEMENT  01/16/2012   Procedure: INSERTION PORT-A-CATH;  Surgeon: Haywood Lasso, MD;  Location: [place];  Service: General;  Laterality: Right;  [place] Cath Placement   . PORTACATH PLACEMENT  05/01/2012   Procedure: INSERTION PORT-A-CATH;  Surgeon: Haywood Lasso, MD;  Location: [place];  Service: General;  Laterality: Right;  right internal jugular port-a-cath insertion  . TEE  WITHOUT CARDIOVERSION  03/06/2012   Procedure: TRANSESOPHAGEAL ECHOCARDIOGRAM (TEE);  Surgeon: Josue Hector, MD;  Location: [place];  Service: Cardiovascular;  Laterality: N/A;  Rm. [id number]  . TONSILLECTOMY    . TOTAL KNEE ARTHROPLASTY  08/15/2011   Procedure: TOTAL KNEE ARTHROPLASTY; lft Surgeon: Alta Corning, MD;  Location: [place];  Service: Orthopedics;  Laterality: Left;  RIGHT KNEE CORTIZONE INJECTION  . TOTAL KNEE ARTHROPLASTY Left 08/18/2012   Procedure:  Irrigation and debridement of LEFT total knee;  Removal of total knee parts; Implant of spacers;  Surgeon: Kerin Salen, MD;  Location: [place];  Service: Orthopedics;  Laterality: Left;  . TOTAL KNEE ARTHROPLASTY Right 08/12/2013  . TOTAL KNEE ARTHROPLASTY Right 08/12/2013   Procedure: RIGHT TOTAL KNEE ARTHROPLASTY;  Surgeon: Kerin Salen, MD;  Location: [place];  Service: Orthopedics;  Laterality: Right;  . TOTAL KNEE REVISION Left 04/19/2013  Procedure: TOTAL KNEE REVISION AND REMOVAL CEMENT SPACER;  Surgeon: Kerin Salen, MD;  Location: [place];  Service: Orthopedics;  Laterality: Left;  . TUMOR REMOVAL     FROM PELVIS AGE 55    FAMILY HISTORY: Family History  Problem Relation Age of Onset  . Breast cancer Paternal Aunt        dx in her 46s  . Heart disease Maternal Grandfather   . Lung cancer Maternal Uncle        smoker  . Breast cancer Paternal Grandmother        dx in her 68s  . Ovarian cancer Paternal Aunt        dx in her 2s  . Arthritis Mother   . Hypertension Mother   . Heart disease Mother        CHF  . Dementia Father   . Diabetes type II Father     SOCIAL HISTORY:  Social History   Socioeconomic History  . Marital status: Married    Spouse name: Not on file  . Number of children: 2  . Years of education: Not on file  . Highest education level: Not on file  Occupational History    Employer: [employer]  . Financial resource strain: Not on file  . Food insecurity:    Worry: Not  on file    Inability: Not on file  . Transportation needs:    Medical: Not on file    Non-medical: Not on file  Tobacco Use  . Smoking status: Never Smoker  . Smokeless tobacco: Never Used  Substance and Sexual Activity  . Alcohol use: No  . Drug use: No  . Sexual activity: Yes  Lifestyle  . Physical activity:    Days per week: Not on file    Minutes per session: Not on file  . Stress: Not on file  Relationships  . Social connections:    Talks on phone: Not on file    Gets together: Not on file    Attends religious service: Not on file    Active member of club or organization: Not on file    Attends meetings of clubs or organizations: Not on file    Relationship status: Not on file  . Intimate partner violence:    Fear of current or ex partner: Not on file    Emotionally abused: Not on file    Physically abused: Not on file    Forced sexual activity: Not on file  Other Topics Concern  . Not on file  Social History Narrative  . Not on file     PHYSICAL EXAM  Vitals:   03/30/18 1105  BP: 128/76  Pulse: 68  Resp: 18  Weight: 232 lb 8 oz (105.5 kg)  Height: 5\' 6"  (1.676 m)    Body mass index is 37.53 kg/m.   General: The patient is well-developed and well-nourished and in no acute distress.      Neurologic Exam  Mental status: The patient is alert and oriented x 3 at the time of the examination. The patient has apparent normal recent and remote memory, with an apparently normal attention span and concentration ability.   Speech is normal.  Cranial nerves: Extraocular movements are full.  Facial strength and sensation is normal. The tongue is midline, and the patient has symmetric elevation of the soft palate. No obvious hearing deficits are noted.  Motor:  Muscle bulk is normal.   Muscle tone  and strength is normal.  Sensory: She has intact sensation to touch and vibration in the arms or legs..  Coordination: Finger-nose-finger is performed well...  Gait  and station: Station is normal.   The gait is arthritic. Tandem gait is wide. The Romberg is negative.  Reflexes: Deep tendon reflexes are symmetric and normal bilaterally in arms.   And absent at the ankles    DIAGNOSTIC DATA (LABS, IMAGING, TESTING) - I reviewed patient records, labs, notes, testing and imaging myself where available.  Lab Results  Component Value Date   WBC 8.9 06/26/2017   HGB 11.2 (L) 06/26/2017   HCT 34.1 (L) 06/26/2017   MCV 72.6 (L) 06/26/2017   PLT 314 06/26/2017      Component Value Date/Time   NA 133 (L) 06/26/2017 1603   NA 136 (A) 09/15/2016   NA 138 08/18/2014 1551   K 3.5 06/26/2017 1603   K 3.7 08/18/2014 1551   CL 97 (L) 06/26/2017 1603   CL 100 11/30/2012 1029   CO2 24 06/26/2017 1603   CO2 27 08/18/2014 1551   GLUCOSE 133 (H) 06/26/2017 1603   GLUCOSE 117 08/18/2014 1551   GLUCOSE 91 11/30/2012 1029   BUN 19 06/26/2017 1603   BUN 17.2 08/18/2014 1551   CREATININE 1.29 (H) 06/26/2017 1603   CREATININE 1.3 (H) 08/18/2014 1551   CALCIUM 9.5 06/26/2017 1603   CALCIUM 9.1 08/18/2014 1551   PROT 6.8 06/26/2017 1603   PROT 6.8 10/15/2014 1420   PROT 7.1 08/18/2014 1551   ALBUMIN 4.5 12/28/2016 1139   ALBUMIN 4.5 10/15/2014 1420   ALBUMIN 4.0 08/18/2014 1551   AST 16 06/26/2017 1603   AST 19 08/18/2014 1551   ALT 15 06/26/2017 1603   ALT 21 08/18/2014 1551   ALKPHOS 143 (H) 12/28/2016 1139   ALKPHOS 137 08/18/2014 1551   BILITOT 0.5 06/26/2017 1603   BILITOT 0.6 10/15/2014 1420   BILITOT 0.46 08/18/2014 1551   GFRNONAA 47 (L) 12/09/2014 1110   GFRAA 55 (L) 12/09/2014 1110       ASSESSMENT AND PLAN  Multiple sclerosis (HCC) - Plan: MR BRAIN W WO CONTRAST  Obstructive sleep apnea  Chronic SI joint pain - Plan: DG Si Joints  Delayed sleep phase syndrome   1.   Check MRI of the brain to determine if there is any subclinical progression.  If present, consider the addition of Aubagio or Tecfidera as a disease modifying  therapy.  I would be reluctant to use Ocrevus due to her breast cancer. 2.  She is going to try to get back on nightly CPAP.    We will ask choice to get her the DreamWear full facemask, medium. 3.  We had a discussion about her delayed phase sleep disorder.  A strategy to slowly advance her sleep to fall asleep at midnight instead of 2 AM was reviewed in detail.   4.  X-ray of the sacroiliac joints.  Consider referral for injection based on results. 5.   She will return to see me in 6 months or sooner if she has new or worsening neurologic symptoms.   45 minutes face-to-face evaluation with greater than one half the time counseling and coordinating care about her MS, delayed phase sleep disorder and pain issues.  Ruffus Kamaka A. Felecia Shelling, MD, PhD 58/52/7782, 42:35 PM Certified in Neurology, Clinical Neurophysiology, Sleep Medicine, Pain Medicine and Neuroimaging  [facility] [address], [place], [place] [address] [phone]

## 2018-04-02 ENCOUNTER — Ambulatory Visit: Payer: Commercial Managed Care - PPO | Admitting: Family Medicine

## 2018-04-02 ENCOUNTER — Encounter: Payer: Self-pay | Admitting: Family Medicine

## 2018-04-02 ENCOUNTER — Telehealth: Payer: Self-pay | Admitting: Neurology

## 2018-04-02 VITALS — BP 120/70 | HR 85 | Temp 97.9°F | Ht 66.0 in | Wt 235.4 lb

## 2018-04-02 DIAGNOSIS — M533 Sacrococcygeal disorders, not elsewhere classified: Secondary | ICD-10-CM

## 2018-04-02 DIAGNOSIS — E039 Hypothyroidism, unspecified: Secondary | ICD-10-CM | POA: Diagnosis not present

## 2018-04-02 DIAGNOSIS — K219 Gastro-esophageal reflux disease without esophagitis: Secondary | ICD-10-CM | POA: Diagnosis not present

## 2018-04-02 DIAGNOSIS — Z23 Encounter for immunization: Secondary | ICD-10-CM | POA: Diagnosis not present

## 2018-04-02 DIAGNOSIS — Z13220 Encounter for screening for lipoid disorders: Secondary | ICD-10-CM | POA: Diagnosis not present

## 2018-04-02 DIAGNOSIS — Z0000 Encounter for general adult medical examination without abnormal findings: Secondary | ICD-10-CM | POA: Diagnosis not present

## 2018-04-02 DIAGNOSIS — G8929 Other chronic pain: Secondary | ICD-10-CM

## 2018-04-02 DIAGNOSIS — G4733 Obstructive sleep apnea (adult) (pediatric): Secondary | ICD-10-CM

## 2018-04-02 DIAGNOSIS — G35 Multiple sclerosis: Secondary | ICD-10-CM

## 2018-04-02 DIAGNOSIS — I10 Essential (primary) hypertension: Secondary | ICD-10-CM

## 2018-04-02 LAB — COMPREHENSIVE METABOLIC PANEL
ALT: 16 U/L (ref 0–35)
AST: 13 U/L (ref 0–37)
Albumin: 4.3 g/dL (ref 3.5–5.2)
Alkaline Phosphatase: 155 U/L — ABNORMAL HIGH (ref 39–117)
BUN: 21 mg/dL (ref 6–23)
CO2: 27 mEq/L (ref 19–32)
Calcium: 9.4 mg/dL (ref 8.4–10.5)
Chloride: 100 mEq/L (ref 96–112)
Creatinine, Ser: 1.27 mg/dL — ABNORMAL HIGH (ref 0.40–1.20)
GFR: 45.68 mL/min — ABNORMAL LOW (ref >60.00)
Glucose, Bld: 153 mg/dL — ABNORMAL HIGH (ref 70–99)
Potassium: 3.3 mEq/L — ABNORMAL LOW (ref 3.5–5.1)
Sodium: 137 mEq/L (ref 135–145)
Total Bilirubin: 0.7 mg/dL (ref 0.2–1.2)
Total Protein: 7.1 g/dL (ref 6.0–8.3)

## 2018-04-02 LAB — VITAMIN D 25 HYDROXY (VIT D DEFICIENCY, FRACTURES): VITD: 28.72 ng/mL — ABNORMAL LOW (ref 30.00–100.00)

## 2018-04-02 LAB — LIPID PANEL
Cholesterol: 152 mg/dL (ref 0–200)
HDL: 69.1 mg/dL (ref >39.00)
LDL Cholesterol: 63 mg/dL (ref 0–99)
NonHDL: 82.96
Total CHOL/HDL Ratio: 2
Triglycerides: 99 mg/dL (ref 0.0–149.0)
VLDL: 19.8 mg/dL (ref 0.0–40.0)

## 2018-04-02 LAB — CBC
HCT: 32.2 % — ABNORMAL LOW (ref 36.0–46.0)
Hemoglobin: 10.3 g/dL — ABNORMAL LOW (ref 12.0–15.0)
MCHC: 31.9 g/dL (ref 30.0–36.0)
MCV: 68.1 fl — ABNORMAL LOW (ref 78.0–100.0)
Platelets: 304 10*3/uL (ref 150.0–400.0)
RBC: 4.73 Mil/uL (ref 3.87–5.11)
RDW: 16.9 % — ABNORMAL HIGH (ref 11.5–15.5)
WBC: 6.3 10*3/uL (ref 4.0–10.5)

## 2018-04-02 LAB — TSH: TSH: 2.44 u[IU]/mL (ref 0.35–4.50)

## 2018-04-02 MED ORDER — PANTOPRAZOLE SODIUM 40 MG PO TBEC: 40.0000 mg | DELAYED_RELEASE_TABLET | Freq: Every day | ORAL | 3 refills | Status: DC

## 2018-04-02 NOTE — Assessment & Plan Note (Signed)
Well-controlled, continue current medications

## 2018-04-02 NOTE — Patient Instructions (Addendum)
-It was nice to meet you today! -Try pantoprazole (protonix) in place of prilosec -Try etodolac in place of ibuprofen.   Adult Wellness Guidelines   Adult Health - for Ages 64 and Over Preventive care is very important for adults. By making some good basic health choices, women and men can boost their own health and well-being. Some of these positive choices include:   Eat a healthy diet  Get regular exercise  Don't use tobacco products  Limit alcohol use  Strive for a healthy weight  Adult Recommendations Screenings Physical Exam Every year, or as directed by your doctor. Body Mass Index (BMI) Every year. Blood Pressure (BP) At least every two years. Colon Cancer Screening Beginning at age 56 - colonoscopy every 10 years, or flexible sigmoidoscopy every five years or fecal blood test annually. Diabetes Screening Those with high blood pressure or high cholesterol should be screened. Others, especially those who are overweight or have a close family history of diabetes, should consider being screened every three years. Vision Screening Every year.  Immunizations Tetanus, Diphtheria, Pertussis (Td/ Tdap) Get Tdap vaccine once, then a Td booster every 10 years. Influenza (Flu) Every year. Herpes Zoster (Shingles) One dose given at age 46 and over. Varicella (Chicken Pox) Two doses if no evidence of immunity. Pneumococcal (Pneumonia) One or two doses for adults age 67 and older, or one or two doses depending on indication. Measles, Mumps, Rubella (MMR) One or two doses for adults ages 26-55 if no evidence of immunity. Human Papillomavirus (HPV) Three doses for women ages 19-26 if not already given. Three doses for men ages 19-21 if not already given.* Hepatitis A Two or three doses for adults age 73 and over.** Hepatitis B Three doses for ages 3 and over.** * Recommendations may vary. Discuss the start and frequency of screenings with your doctor, especially if you are at  increased risk. ** For select populations. Discuss with your doctor if this vaccine is right for you.  Women's Health Women have their own unique health care needs. To stay well, they should make regular screenings a priority. Women should discuss the recommendations listed on the chart with their doctors. Women's Recommendations Mammogram Every year for women beginning at age 61.* Cholesterol Starting age and frequency of screenings are based on your individual risk factors. Talk with your doctor about what is best for you. Pap Test Women ages 21-65: Pap test every three years. Another option for ages 26-65: Pap test and HPV test every five years. Women who have had a hysterectomy or are over age 15 may not need a Pap test.* Osteoporosis Screening Beginning at age 68, or at age 55 if risk factors are present.* Aspirin Use At ages 45-79, talk with your doctor about the benefits and risks of aspirin use. Pelvic Exam Every year for ages 79 and over. Folic Acid Women planning/capable of pregnancy should take a daily supplement containing .4-.8 mg of folic acid for prevention of neural tube defects.  * Recommendations may vary. Discuss screening options with your doctor, especially if you are at increased risk. Sources: [place] Department of Health and Financial controller and the Centers for Disease Control and Prevention, U.S. Preventive Services Task Force   Men's Health Recommendations Men are encouraged to get care as needed and make smart choices. That includes following a healthy lifestyle and getting recommended preventive care services.  Recommended preventative care services are as follows:  Cholesterol Ages 20-35 should be tested if at  high risk. Men age 73 and over should be tested. Prostate Cancer Screening Ages 27 and over, discuss the benefits and risks of screening with your doctor.* Abdominal Aortic Aneurysm Once between ages 21 and 33 if you have ever  smoked.

## 2018-04-02 NOTE — Telephone Encounter (Signed)
[facility] Auth: NPR spoke to Bethena Roys Ref # [id number] order sent to GI. They will reach out to the pt to schedule.

## 2018-04-02 NOTE — Assessment & Plan Note (Signed)
Stable, follows with neurology

## 2018-04-02 NOTE — Assessment & Plan Note (Signed)
-  Discussed trying to remain compliant with CPAP therapy as this may help her fatigue and may be a barrier to her weight loss.

## 2018-04-02 NOTE — Assessment & Plan Note (Signed)
-  Not well controlled with prilosec, trial of protonix.

## 2018-04-02 NOTE — Assessment & Plan Note (Signed)
-  May benefit from manipulation.  Interested in seeing Dr. Tamala Julian with sports med, referral placed.

## 2018-04-02 NOTE — Assessment & Plan Note (Signed)
Well adult Orders Placed This Encounter  Procedures  . Flu Vaccine QUAD 6+ mos PF IM (Fluarix Quad PF)  . Comp Met (CMET)  . CBC  . Lipid Profile  . TSH  . Vitamin D (25 hydroxy)  Immunizations:  Flu vaccine Screenings:  Lipid screening Anticipatory guidance/Risk factor reduction:  Per AVS

## 2018-04-02 NOTE — Telephone Encounter (Signed)
Patient is aware of this and if she hasn't heard for GI in the next 2-3 business days to give them a call at [phone].

## 2018-04-02 NOTE — Progress Notes (Signed)
Julie Ross - 57 y.o. female MRN [id number]  Date of birth: 09-01-1960  Subjective Chief Complaint  Patient presents with  . Weight Gain    HPI Julie Ross is a 57 y.o. female with history of anxiety with insomnia, OSA, MS, GERD, HTN, and hypothyroidism here today for CPE.  She reports increased fatigue and difficulty with weight loss.  She admits to being "tired all of the time".  She isn't sure if this is related to her MS or something else.  She admits to not using her CPAP regularly because the mask is uncomfortable.  She plans on trying a new mask but her insurance will not cover until the first of the year.  She has difficulty with sleep onset as well and takes xanax at bedtime, this is helpful sometimes.  Her neurologist suggested she try melatonin as well.  Due to her fatigue and ongoing back pain she has not been very diligent about exercise.  She has seen PT for the back pain and had injections without much improvement.  Her PT suggested manipulation to the lumbar spine and SI would be helpful for her.  Her diet is fairly good, reports chicken and fish as sources of protein.  She does often accompany this with starchy foods.    Review of Systems  Constitutional: Positive for malaise/fatigue. Negative for chills, fever and weight loss.  HENT: Negative for congestion, ear pain and sore throat.   Eyes: Negative for blurred vision, double vision and pain.  Respiratory: Negative for cough and shortness of breath.   Cardiovascular: Negative for chest pain and palpitations.  Gastrointestinal: Negative for abdominal pain, blood in stool, constipation, heartburn and nausea.  Genitourinary: Negative for dysuria and urgency.  Musculoskeletal: Positive for back pain. Negative for joint pain and myalgias.  Neurological: Negative for dizziness and headaches.  Endo/Heme/Allergies: Does not bruise/bleed easily.  Psychiatric/Behavioral: Negative for depression. The patient is not nervous/anxious  and does not have insomnia.     Allergies  Allergen Reactions  . Bee Venom Anaphylaxis    Past Medical History:  Diagnosis Date  . Anemia    h/o - ? bld. transfusion - more recent- ?2013  . Anxiety    uses xanax mainly for sleep  . Arthritis    knees   . Breast cancer ([place]) 11/2011   lul/ER+PR+, x1 lymph node   . Cancer ([facility])    squamous cell skin cancer x7  . Depression   . GERD (gastroesophageal reflux disease)   . H/O bone density study 03/2011  . H/O colonoscopy   . H/O echocardiogram    last one 02/2013, due to chemotherapy  . Heart burn   . Hx of radiation therapy 04/17/12- 06/04/12   left chest wall, axilla, L supraclavicular fossa 4500 cGy, left mastectomy scar/chest wall 5940 cGy  . Hypertension   . Hypothyroidism   . Multiple sclerosis ([place])   . Neuromuscular disorder ([place])    MS TX WITH CYMBALTA   . Personal history of chemotherapy   . Personal history of radiation therapy   . Sleep apnea    MILD SLEEP APNEA, NO (Needed) MACHINE  6 YRS AGO HPT [facility]   . Wears glasses     Past Surgical History:  Procedure Laterality Date  . CESAREAN SECTION     X2   . COLONOSCOPY    . ESOPHAGOGASTRODUODENOSCOPY    . FLEXIBLE SIGMOIDOSCOPY    . I&D KNEE WITH POLY EXCHANGE  03/02/2012   Procedure:  IRRIGATION AND DEBRIDEMENT KNEE WITH POLY EXCHANGE;  Surgeon: Alta Corning, MD;  Location: [facility];  Service: Orthopedics;  Laterality: Left;  I&D of total knee with Possible Poly Exchange   . JOINT REPLACEMENT  Feb 2013   left knee- multiple surgeries   . KNEE ARTHROSCOPY     LT KNEE 04/2011  . KNEE ARTHROSCOPY  84   rt  . MASTECTOMY    . MASTECTOMY W/ SENTINEL NODE BIOPSY  12/19/2011   Procedure: MASTECTOMY WITH SENTINEL LYMPH NODE BIOPSY;  Surgeon: Haywood Lasso, MD;  Location: [facility];  Service: General;  Laterality: Left;  left breast and left axilla   . MOHS SURGERY     right face  . NO PAST SURGERIES     BLADDER SLING  4-5 YRS AGO   . PORT-A-CATH REMOVAL   03/06/2012   Procedure: REMOVAL PORT-A-CATH;  Surgeon: Odis Hollingshead, MD;  Location: [facility];  Service: General;  Laterality: N/A;  . PORT-A-CATH REMOVAL Right 08/22/2012   Procedure: REMOVAL PORT-A-CATH;  Surgeon: Edward Jolly, MD;  Location: [facility];  Service: General;  Laterality: Right;  . PORTACATH PLACEMENT  01/16/2012   Procedure: INSERTION PORT-A-CATH;  Surgeon: Haywood Lasso, MD;  Location: [facility];  Service: General;  Laterality: Right;  [place] Cath Placement   . PORTACATH PLACEMENT  05/01/2012   Procedure: INSERTION PORT-A-CATH;  Surgeon: Haywood Lasso, MD;  Location: [facility];  Service: General;  Laterality: Right;  right internal jugular port-a-cath insertion  . TEE WITHOUT CARDIOVERSION  03/06/2012   Procedure: TRANSESOPHAGEAL ECHOCARDIOGRAM (TEE);  Surgeon: Josue Hector, MD;  Location: [facility];  Service: Cardiovascular;  Laterality: N/A;  Rm. 2927  . TONSILLECTOMY    . TOTAL KNEE ARTHROPLASTY  08/15/2011   Procedure: TOTAL KNEE ARTHROPLASTY; lft Surgeon: Alta Corning, MD;  Location: [facility];  Service: Orthopedics;  Laterality: Left;  RIGHT KNEE CORTIZONE INJECTION  . TOTAL KNEE ARTHROPLASTY Left 08/18/2012   Procedure:  Irrigation and debridement of LEFT total knee;  Removal of total knee parts; Implant of spacers;  Surgeon: Kerin Salen, MD;  Location: [facility];  Service: Orthopedics;  Laterality: Left;  . TOTAL KNEE ARTHROPLASTY Right 08/12/2013  . TOTAL KNEE ARTHROPLASTY Right 08/12/2013   Procedure: RIGHT TOTAL KNEE ARTHROPLASTY;  Surgeon: Kerin Salen, MD;  Location: [facility];  Service: Orthopedics;  Laterality: Right;  . TOTAL KNEE REVISION Left 04/19/2013   Procedure: TOTAL KNEE REVISION AND REMOVAL CEMENT SPACER;  Surgeon: Kerin Salen, MD;  Location: [facility];  Service: Orthopedics;  Laterality: Left;  . TUMOR REMOVAL     FROM [facility] AGE 6    Social History   Socioeconomic History  . Marital status: Married    Spouse name:  Not on file  . Number of children: 2  . Years of education: Not on file  . Highest education level: Not on file  Occupational History    Employer: [employer]  . Financial resource strain: Not on file  . Food insecurity:    Worry: Not on file    Inability: Not on file  . Transportation needs:    Medical: Not on file    Non-medical: Not on file  Tobacco Use  . Smoking status: Never Smoker  . Smokeless tobacco: Never Used  Substance and Sexual Activity  . Alcohol use: No  . Drug use: No  . Sexual activity: Yes  Lifestyle  .  Physical activity:    Days per week: Not on file    Minutes per session: Not on file  . Stress: Not on file  Relationships  . Social connections:    Talks on phone: Not on file    Gets together: Not on file    Attends religious service: Not on file    Active member of club or organization: Not on file    Attends meetings of clubs or organizations: Not on file    Relationship status: Not on file  Other Topics Concern  . Not on file  Social History Narrative  . Not on file    Family History  Problem Relation Age of Onset  . Breast cancer Paternal Aunt        dx in her 58s  . Heart disease Maternal Grandfather   . Lung cancer Maternal Uncle        smoker  . Breast cancer Paternal Grandmother        dx in her 38s  . Ovarian cancer Paternal Aunt        dx in her 89s  . Arthritis Mother   . Hypertension Mother   . Heart disease Mother        CHF  . Dementia Father   . Diabetes type II Father     Health Maintenance  Topic Date Due  . INFLUENZA VACCINE  01/18/2018  . MAMMOGRAM  02/28/2018  . HIV Screening  06/26/2018 (Originally 08/29/1973)  . PAP SMEAR  03/01/2019  . TETANUS/TDAP  06/20/2021  . COLONOSCOPY  02/24/2025  . Hepatitis C Screening  Completed     ----------------------------------------------------------------------------------------------------------------------------------------------------------------------------------------------------------------- Physical Exam BP 120/70   Pulse 85   Temp 97.9 F (36.6 C) (Oral)   Ht 5' 6" (1.676 m)   Wt 235 lb 6.4 oz (106.8 kg)   LMP 12/13/2011   SpO2 96%   BMI 37.99 kg/m   Physical Exam  Constitutional: She is oriented to person, place, and time. She appears well-nourished. No distress.  HENT:  Head: Normocephalic and atraumatic.  Nose: Nose normal.  Mouth/Throat: Oropharynx is clear and moist.  Eyes: Conjunctivae are normal. No scleral icterus.  Neck: Normal range of motion. Neck supple. No thyromegaly present.  Cardiovascular: Normal rate, regular rhythm and normal heart sounds.  Pulmonary/Chest: Effort normal and breath sounds normal.  Abdominal: Soft. Bowel sounds are normal. She exhibits no distension. There is no tenderness. There is no guarding.  Musculoskeletal: Normal range of motion. She exhibits no edema.  Lymphadenopathy:    She has no cervical adenopathy.  Neurological: She is alert and oriented to person, place, and time. No cranial nerve deficit. Coordination normal.  Skin: Skin is warm and dry. No rash noted.  Psychiatric: She has a normal mood and affect. Her behavior is normal.    ------------------------------------------------------------------------------------------------------------------------------------------------------------------------------------------------------------------- Assessment and Plan  Relapsing remitting multiple sclerosis ([place]) Stable, follows with neurology  Esophageal reflux -Not well controlled with prilosec, trial of protonix.   Essential hypertension, benign Well controlled, continue current medications.   Obstructive sleep apnea -Discussed trying to remain compliant with CPAP therapy as this may help her fatigue and  may be a barrier to her weight loss.   Well adult exam Well adult Orders Placed This Encounter  Procedures  . Flu Vaccine QUAD 6+ mos PF IM (Fluarix Quad PF)  . Comp Met (CMET)  . CBC  . Lipid Profile  . TSH  . Vitamin D (25 hydroxy)  Immunizations:  Flu vaccine Screenings:  Lipid screening Anticipatory guidance/Risk factor reduction:  Per AVS  Chronic SI joint pain -May benefit from manipulation.  Interested in seeing Dr. Tamala Julian with sports med, referral placed.

## 2018-04-07 ENCOUNTER — Other Ambulatory Visit: Payer: Self-pay | Admitting: Family Medicine

## 2018-04-08 ENCOUNTER — Other Ambulatory Visit: Payer: Commercial Managed Care - PPO

## 2018-04-13 ENCOUNTER — Ambulatory Visit
Admission: RE | Admit: 2018-04-13 | Discharge: 2018-04-13 | Disposition: A | Discharge status: Home / Self Care | Payer: Commercial Managed Care - PPO | Source: Ambulatory Visit | Attending: Neurology | Admitting: Neurology

## 2018-04-13 DIAGNOSIS — M533 Sacrococcygeal disorders, not elsewhere classified: Secondary | ICD-10-CM | POA: Diagnosis not present

## 2018-04-13 DIAGNOSIS — G35 Multiple sclerosis: Secondary | ICD-10-CM

## 2018-04-13 DIAGNOSIS — G8929 Other chronic pain: Secondary | ICD-10-CM

## 2018-04-13 MED ORDER — GADOBENATE DIMEGLUMINE 529 MG/ML IV SOLN
20.0000 mL | Freq: Once | INTRAVENOUS | Status: AC | PRN
  Administered 2018-04-13: 20 mL via INTRAVENOUS

## 2018-04-16 ENCOUNTER — Telehealth: Payer: Self-pay | Admitting: Neurology

## 2018-04-16 NOTE — Telephone Encounter (Signed)
I spoke to [facility] about her imaging studies.  The MRI of the brain did show one subacute MS lesion in the splenium.  It also shows a complex pineal cyst that appears unchanged to her last MRI and 2 small meningiomas on the left, probably a little bigger than the 2017 MRI though, by report, unchanged in size to her outside MRI      Because of the new MS lesion in her being off of a disease modifying therapy, I would advise that she gets back on therapy.  She has a history of breast cancer.  Therefore, I feel safe for option for her may be to go on Aubagio (she was last on Gilenya).  She will come in this week to get a QuantiFERON TB test and to sign the Aubagio form.  The SI joints and the hips were normal on plain films.

## 2018-04-17 ENCOUNTER — Other Ambulatory Visit: Payer: Self-pay | Admitting: Neurology

## 2018-04-17 DIAGNOSIS — Z79899 Other long term (current) drug therapy: Secondary | ICD-10-CM

## 2018-04-17 DIAGNOSIS — G35 Multiple sclerosis: Secondary | ICD-10-CM

## 2018-04-17 NOTE — Progress Notes (Deleted)
[facility] [address], [place] [address] Phone: [phone] Subjective:    I'm seeing this patient by the request  of:    CC:   [id number]  Julie Ross is a 57 y.o. female coming in with complaint of ***  Onset-  Location Duration-  Character- Aggravating factors- Reliving factors-  Therapies tried-  Severity-     Past Medical History:  Diagnosis Date  . Anemia    h/o - ? bld. transfusion - more recent- ?2013  . Anxiety    uses xanax mainly for sleep  . Arthritis    knees   . Breast cancer ([facility]) 11/2011   lul/ER+PR+, x1 lymph node   . Cancer (HCC)    squamous cell skin cancer x7  . Depression   . GERD (gastroesophageal reflux disease)   . H/O bone density study 03/2011  . H/O colonoscopy   . H/O echocardiogram    last one 02/2013, due to chemotherapy  . Heart burn   . Hx of radiation therapy 04/17/12- 06/04/12   left chest wall, axilla, L supraclavicular fossa 4500 cGy, left mastectomy scar/chest wall 5940 cGy  . Hypertension   . Hypothyroidism   . Multiple sclerosis ([place])   . Neuromuscular disorder ([place])    MS TX WITH CYMBALTA   . Personal history of chemotherapy   . Personal history of radiation therapy   . Sleep apnea    MILD SLEEP APNEA, NO (Needed) MACHINE  6 YRS AGO HPT [facility]   . Wears glasses    Past Surgical History:  Procedure Laterality Date  . CESAREAN SECTION     X2   . COLONOSCOPY    . ESOPHAGOGASTRODUODENOSCOPY    . FLEXIBLE SIGMOIDOSCOPY    . I&D KNEE WITH POLY EXCHANGE  03/02/2012   Procedure: IRRIGATION AND DEBRIDEMENT KNEE WITH POLY EXCHANGE;  Surgeon: Alta Corning, MD;  Location: [facility];  Service: Orthopedics;  Laterality: Left;  I&D of total knee with Possible Poly Exchange   . JOINT REPLACEMENT  Feb 2013   left knee- multiple surgeries   . KNEE ARTHROSCOPY     LT KNEE 04/2011  . KNEE ARTHROSCOPY  84   rt  . MASTECTOMY    . MASTECTOMY W/ SENTINEL NODE BIOPSY  12/19/2011   Procedure: MASTECTOMY WITH SENTINEL LYMPH NODE BIOPSY;  Surgeon: Haywood Lasso, MD;  Location: [facility];  Service: General;  Laterality: Left;  left breast and left axilla   . MOHS SURGERY     right face  . NO PAST SURGERIES     BLADDER SLING  4-5 YRS AGO   . PORT-A-CATH REMOVAL  03/06/2012   Procedure: REMOVAL PORT-A-CATH;  Surgeon: Odis Hollingshead, MD;  Location: [facility];  Service: General;  Laterality: N/A;  . PORT-A-CATH REMOVAL Right 08/22/2012   Procedure: REMOVAL PORT-A-CATH;  Surgeon: Edward Jolly, MD;  Location: [facility];  Service: General;  Laterality: Right;  . PORTACATH PLACEMENT  01/16/2012   Procedure: INSERTION PORT-A-CATH;  Surgeon: Haywood Lasso, MD;  Location: [facility];  Service: General;  Laterality: Right;  [place] Cath Placement   . PORTACATH PLACEMENT  05/01/2012   Procedure: INSERTION PORT-A-CATH;  Surgeon: Haywood Lasso, MD;  Location: [facility];  Service: General;  Laterality: Right;  right internal jugular port-a-cath insertion  . TEE WITHOUT CARDIOVERSION  03/06/2012   Procedure: TRANSESOPHAGEAL ECHOCARDIOGRAM (TEE);  Surgeon: Josue Hector, MD;  Location: [facility]  ENDOSCOPY;  Service: Cardiovascular;  Laterality: N/A;  Rm. 2927  . TONSILLECTOMY    . TOTAL KNEE ARTHROPLASTY  08/15/2011   Procedure: TOTAL KNEE ARTHROPLASTY; lft Surgeon: Alta Corning, MD;  Location: [facility];  Service: Orthopedics;  Laterality: Left;  RIGHT KNEE CORTIZONE INJECTION  . TOTAL KNEE ARTHROPLASTY Left 08/18/2012   Procedure:  Irrigation and debridement of LEFT total knee;  Removal of total knee parts; Implant of spacers;  Surgeon: Kerin Salen, MD;  Location: [facility];  Service: Orthopedics;  Laterality: Left;  . TOTAL KNEE ARTHROPLASTY Right 08/12/2013  . TOTAL KNEE ARTHROPLASTY Right 08/12/2013   Procedure: RIGHT TOTAL KNEE ARTHROPLASTY;  Surgeon: Kerin Salen, MD;  Location: [facility];  Service: Orthopedics;  Laterality: Right;  . TOTAL KNEE REVISION Left  04/19/2013   Procedure: TOTAL KNEE REVISION AND REMOVAL CEMENT SPACER;  Surgeon: Kerin Salen, MD;  Location: [facility];  Service: Orthopedics;  Laterality: Left;  . TUMOR REMOVAL     FROM [place] AGE 89   Social History   Socioeconomic History  . Marital status: Married    Spouse name: Not on file  . Number of children: 2  . Years of education: Not on file  . Highest education level: Not on file  Occupational History    Employer: [employer]  . Financial resource strain: Not on file  . Food insecurity:    Worry: Not on file    Inability: Not on file  . Transportation needs:    Medical: Not on file    Non-medical: Not on file  Tobacco Use  . Smoking status: Never Smoker  . Smokeless tobacco: Never Used  Substance and Sexual Activity  . Alcohol use: No  . Drug use: No  . Sexual activity: Yes  Lifestyle  . Physical activity:    Days per week: Not on file    Minutes per session: Not on file  . Stress: Not on file  Relationships  . Social connections:    Talks on phone: Not on file    Gets together: Not on file    Attends religious service: Not on file    Active member of club or organization: Not on file    Attends meetings of clubs or organizations: Not on file    Relationship status: Not on file  Other Topics Concern  . Not on file  Social History Narrative  . Not on file   Allergies  Allergen Reactions  . Bee Venom Anaphylaxis   Family History  Problem Relation Age of Onset  . Breast cancer Paternal Aunt        dx in her 70s  . Heart disease Maternal Grandfather   . Lung cancer Maternal Uncle        smoker  . Breast cancer Paternal Grandmother        dx in her 89s  . Ovarian cancer Paternal Aunt        dx in her 42s  . Arthritis Mother   . Hypertension Mother   . Heart disease Mother        CHF  . Dementia Father   . Diabetes type II Father     Current Outpatient Medications (Endocrine & Metabolic):  .  levothyroxine (SYNTHROID,  LEVOTHROID) 88 MCG tablet, TAKE 1 TABLET DAILY  Current Outpatient Medications (Cardiovascular):  .  amLODipine (NORVASC) 10 MG tablet, TAKE 1 TABLET DAILY .  atorvastatin (LIPITOR) 20 MG tablet, TAKE 1 TABLET  DAILY .  losartan-hydrochlorothiazide (HYZAAR) 100-25 MG tablet, TAKE 1 TABLET EVERY MORNING BEFORE BREAKFAST  Current Outpatient Medications (Respiratory):  .  cetirizine (ZYRTEC) 10 MG tablet, TAKE 1 TABLET(10 MG) BY MOUTH DAILY  Current Outpatient Medications (Analgesics):  .  etodolac (LODINE) 400 MG tablet, Take 1 tablet (400 mg total) by mouth 2 (two) times daily. (Patient not taking: Reported on 04/02/2018)   Current Outpatient Medications (Other):  Marland Kitchen  ALPRAZolam (XANAX) 0.5 MG tablet, Take 1 tablet (0.5 mg total) by mouth 3 (three) times daily as needed. for anxiety .  baclofen (LIORESAL) 10 MG tablet, TAKE 1 TABLET THREE TIMES A DAY AS NEEDED FOR MUSCLE SPASMS .  buPROPion (WELLBUTRIN XL) 150 MG 24 hr tablet, TAKE 1 TABLET DAILY .  CALCIUM-MAGNESIUM PO, Take 1 tablet by mouth daily. Marland Kitchen  gabapentin (NEURONTIN) 300 MG capsule, Take 1-2 pills twice a day .  letrozole (FEMARA) 2.5 MG tablet, Take 1 tablet (2.5 mg total) by mouth daily. .  Multiple Vitamins-Minerals (MULTIVITAMINS THER. W/MINERALS) TABS, Take 1 tablet by mouth daily. .  pantoprazole (PROTONIX) 40 MG tablet, Take 1 tablet (40 mg total) by mouth daily. .  potassium chloride SA (K-DUR,KLOR-CON) 20 MEQ tablet, TAKE 1 TABLET DAILY .  Probiotic Product (PROBIOTIC DAILY PO), Take 1 capsule by mouth daily.  .  vitamin E 400 UNIT capsule, Take 800 Units by mouth daily.    Past medical history, social, surgical and family history all reviewed in electronic medical record.  No pertanent information unless stated regarding to the chief complaint.   Review of Systems:  No headache, visual changes, nausea, vomiting, diarrhea, constipation, dizziness, abdominal pain, skin rash, fevers, chills, night sweats, weight loss,  swollen lymph nodes, body aches, joint swelling, muscle aches, chest pain, shortness of breath, mood changes.   Objective  Last menstrual period 12/13/2011. Systems examined below as of    General: No apparent distress alert and oriented x3 mood and affect normal, dressed appropriately.  HEENT: Pupils equal, extraocular movements intact  Respiratory: Patient's speak in full sentences and does not appear short of breath  Cardiovascular: No lower extremity edema, non tender, no erythema  Skin: Warm dry intact with no signs of infection or rash on extremities or on axial skeleton.  Abdomen: Soft nontender  Neuro: Cranial nerves II through XII are intact, neurovascularly intact in all extremities with 2+ DTRs and 2+ pulses.  Lymph: No lymphadenopathy of posterior or anterior cervical chain or axillae bilaterally.  Gait normal with good balance and coordination.  MSK:  Non tender with full range of motion and good stability and symmetric strength and tone of shoulders, elbows, wrist, hip, knee and ankles bilaterally.     Impression and Recommendations:     This case required medical decision making of moderate complexity. The above documentation has been reviewed and is accurate and complete Julie Pulley, DO       Note: This dictation was prepared with Dragon dictation along with smaller phrase technology. Any transcriptional errors that result from this process are unintentional.

## 2018-04-18 ENCOUNTER — Ambulatory Visit: Payer: Commercial Managed Care - PPO | Admitting: Family Medicine

## 2018-04-19 NOTE — Telephone Encounter (Signed)
Called pt. Reminded her about coming to sign start form for Aubagio and to get labs done. She will come next week to do this.

## 2018-04-24 ENCOUNTER — Other Ambulatory Visit (INDEPENDENT_AMBULATORY_CARE_PROVIDER_SITE_OTHER): Payer: Self-pay

## 2018-04-24 DIAGNOSIS — G35 Multiple sclerosis: Secondary | ICD-10-CM | POA: Diagnosis not present

## 2018-04-24 DIAGNOSIS — Z79899 Other long term (current) drug therapy: Secondary | ICD-10-CM

## 2018-04-24 DIAGNOSIS — Z0289 Encounter for other administrative examinations: Secondary | ICD-10-CM

## 2018-04-26 ENCOUNTER — Encounter: Payer: Self-pay | Admitting: Family Medicine

## 2018-04-26 NOTE — Telephone Encounter (Signed)
I called pt again to remind her she needed to sign start form for Aubagio. She had labs completed on 04/24/18. She will come back 04/30/18 to sign form. She is unable to come today or tomorrow. Her husband took care in today to get work done and tomorrow she has doctor appt.

## 2018-04-27 DIAGNOSIS — M533 Sacrococcygeal disorders, not elsewhere classified: Secondary | ICD-10-CM | POA: Diagnosis not present

## 2018-04-28 LAB — QUANTIFERON-TB GOLD PLUS
QuantiFERON Mitogen Value: >10 IU/mL
QuantiFERON Nil Value: 0.02 IU/mL
QuantiFERON TB1 Ag Value: 0.02 IU/mL
QuantiFERON TB2 Ag Value: 0.02 IU/mL
QuantiFERON-TB Gold Plus: NEGATIVE

## 2018-04-30 ENCOUNTER — Other Ambulatory Visit: Payer: Self-pay | Admitting: Family Medicine

## 2018-04-30 ENCOUNTER — Telehealth: Payer: Self-pay | Admitting: *Deleted

## 2018-04-30 NOTE — Telephone Encounter (Signed)
Faxed completed/signed start form to ms one to one at [phone]. Received fax confirmation.

## 2018-05-01 ENCOUNTER — Telehealth: Payer: Self-pay | Admitting: *Deleted

## 2018-05-01 NOTE — Telephone Encounter (Signed)
PA Aubagio 14mg  tab submitted to CMM. Key: [id number]. Waiting on determination.

## 2018-05-01 NOTE — Telephone Encounter (Signed)
IXMDEK:[id number];IDXFPK:[id number];Review Type:Prior Auth;Coverage Start Date:04/01/2018;Coverage End Date:05/01/2019.  Faxed notice of approval to [employer] at [phone]. Received fax confirmation.  Also received fax notification from [place] [employer] one that pt was approved for Aubagio copay assistance program. Eligibility date 05/01/18-05/01/19. Copay ID#: [id number]. Group# [id number], ADL#[id number]

## 2018-05-02 ENCOUNTER — Encounter: Payer: Commercial Managed Care - PPO | Admitting: Family Medicine

## 2018-05-03 ENCOUNTER — Other Ambulatory Visit: Payer: Self-pay | Admitting: Orthopedic Surgery

## 2018-05-03 DIAGNOSIS — M533 Sacrococcygeal disorders, not elsewhere classified: Secondary | ICD-10-CM

## 2018-05-11 ENCOUNTER — Ambulatory Visit
Admission: RE | Admit: 2018-05-11 | Discharge: 2018-05-11 | Disposition: A | Discharge status: Home / Self Care | Payer: Commercial Managed Care - PPO | Source: Ambulatory Visit | Attending: Orthopedic Surgery | Admitting: Orthopedic Surgery

## 2018-05-11 DIAGNOSIS — M533 Sacrococcygeal disorders, not elsewhere classified: Secondary | ICD-10-CM

## 2018-05-11 MED ORDER — METHYLPREDNISOLONE ACETATE 40 MG/ML INJ SUSP (RADIOLOG
120.0000 mg | Freq: Once | INTRAMUSCULAR | Status: AC
  Administered 2018-05-11: 120 mg via INTRA_ARTICULAR

## 2018-05-22 ENCOUNTER — Other Ambulatory Visit: Payer: Self-pay | Admitting: *Deleted

## 2018-05-22 DIAGNOSIS — Z79899 Other long term (current) drug therapy: Secondary | ICD-10-CM

## 2018-05-22 DIAGNOSIS — G35 Multiple sclerosis: Secondary | ICD-10-CM

## 2018-05-27 ENCOUNTER — Other Ambulatory Visit: Payer: Self-pay | Admitting: Family Medicine

## 2018-05-31 ENCOUNTER — Other Ambulatory Visit (INDEPENDENT_AMBULATORY_CARE_PROVIDER_SITE_OTHER): Payer: Self-pay

## 2018-05-31 DIAGNOSIS — Z79899 Other long term (current) drug therapy: Secondary | ICD-10-CM

## 2018-05-31 DIAGNOSIS — Z0289 Encounter for other administrative examinations: Secondary | ICD-10-CM

## 2018-05-31 DIAGNOSIS — G35 Multiple sclerosis: Secondary | ICD-10-CM

## 2018-06-01 ENCOUNTER — Telehealth: Payer: Self-pay | Admitting: *Deleted

## 2018-06-01 LAB — HEPATIC FUNCTION PANEL
ALT: 15 IU/L (ref 0–32)
AST: 19 IU/L (ref 0–40)
Albumin: 4.6 g/dL (ref 3.5–5.5)
Alkaline Phosphatase: 200 IU/L — ABNORMAL HIGH (ref 39–117)
Bilirubin Total: 0.6 mg/dL (ref 0.0–1.2)
Bilirubin, Direct: 0.17 mg/dL (ref 0.00–0.40)
Total Protein: 7.3 g/dL (ref 6.0–8.5)

## 2018-06-01 NOTE — Telephone Encounter (Signed)
-----   Message from Britt Bottom, MD sent at 06/01/2018  9:12 AM EST ----- Please let the patient know that the lab work is fine.    The one lab, alkaline phosphatase, is elevated but has been so going back over a year so is nothing to worry about

## 2018-06-01 NOTE — Telephone Encounter (Signed)
Spoke to patient and notified her of the lab results. 

## 2018-06-02 ENCOUNTER — Other Ambulatory Visit: Payer: Self-pay | Admitting: Family Medicine

## 2018-06-03 ENCOUNTER — Other Ambulatory Visit: Payer: Self-pay | Admitting: Family Medicine

## 2018-06-05 ENCOUNTER — Telehealth: Payer: Self-pay | Admitting: *Deleted

## 2018-06-05 NOTE — Telephone Encounter (Signed)
[place]. [employer] disability form completed. LMOM for pt. to call to let us know if she wants form faxed in or if she wants to pick it up in the office./fim

## 2018-06-16 ENCOUNTER — Other Ambulatory Visit: Payer: Self-pay | Admitting: Family Medicine

## 2018-06-22 NOTE — Progress Notes (Signed)
Patient Care Team: Luetta Nutting, DO as PCP - General (Family Medicine) Juanda Chance, NP as Nurse Practitioner (Obstetrics and Gynecology) Gatha Mayer, MD as Consulting Physician (Gastroenterology) Frederik Pear, MD as Consulting Physician (Orthopedic Surgery)  DIAGNOSIS:    ICD-10-CM   1. Malignant neoplasm of upper-outer quadrant of right breast in female, estrogen receptor positive ([place]) C50.411    Z17.0     SUMMARY OF ONCOLOGIC HISTORY:   Breast cancer of upper-outer quadrant of right female breast ([place])   11/22/2011 Initial Diagnosis    Cancer of upper-outer quadrant of female breast    12/19/2011 Surgery    Left mastectomy with SLN biopsy 2 foci of invasive ductal carcinoma 0.8 and 0.6 cm low-grade ER PR positive HER-2 positive ratio 3.27 Ki-67 11% one lymph node positive out of 4 (T1, N1, M0 stage II)    02/16/2012 - 04/14/2013 Chemotherapy    Taxotere, carboplatin, Herceptin x2 cycles complicated by bacteremia and left knee sepsis requiring revision of the knee. Herceptin maintenance every 3 weeks. Herceptin maintenance completed 04/14/2013    04/17/2012 - 06/04/2012 Radiation Therapy    Radiation therapy to the breast    03/13/2013 -  Anti-estrogen oral therapy    Letrozole 2.5 mg once daily     CHIEF COMPLIANT: Follow-up on letrozole therapy  INTERVAL HISTORY: Julie Ross is a 58 y.o. with above-mentioned history of left breast cancer treated with mastectomy followed by adjuvant chemotherapy and radiation and is currently on letrozole for over 5 years. I last saw the patient one year ago. Her most recent mammogram on 07/05/17 showed no evidence of malignancy in the right breast. She presents to the clinic today alone and is doing well. She denies any hot flashes, but notes weight gain. She is currently on Wellbutrin for depression. She has MS that is stable, but she notes cognitive issues, especially with memory loss. She reviewed her medication list with me.    REVIEW OF SYSTEMS:   Constitutional: Denies fevers, chills or abnormal weight loss (+) weight gain Eyes: Denies blurriness of vision Ears, nose, mouth, throat, and face: Denies mucositis or sore throat Respiratory: Denies cough, dyspnea or wheezes Cardiovascular: Denies palpitation, chest discomfort Gastrointestinal:  Denies nausea, heartburn or change in bowel habits Skin: Denies abnormal skin rashes Lymphatics: Denies new lymphadenopathy or easy bruising Neurological:Denies numbness, tingling or new weaknesses Behavioral/Psych: (+) memory loss (+) depression Extremities: No lower extremity edema Breast: denies any pain or lumps or nodules in either breasts All other systems were reviewed with the patient and are negative.  I have reviewed the past medical history, past surgical history, social history and family history with the patient and they are unchanged from previous note.  ALLERGIES:  is allergic to bee venom.  MEDICATIONS:  Current Outpatient Medications  Medication Sig Dispense Refill  . ALPRAZolam (XANAX) 0.5 MG tablet Take 1 tablet (0.5 mg total) by mouth 3 (three) times daily as needed. for anxiety 270 tablet 0  . amLODipine (NORVASC) 10 MG tablet TAKE 1 TABLET DAILY 90 tablet 0  . atorvastatin (LIPITOR) 20 MG tablet TAKE 1 TABLET DAILY 90 tablet 1  . baclofen (LIORESAL) 10 MG tablet TAKE 1 TABLET THREE TIMES A DAY AS NEEDED FOR MUSCLE SPASMS 270 tablet 3  . buPROPion (WELLBUTRIN XL) 150 MG 24 hr tablet TAKE 1 TABLET DAILY 90 tablet 4  . CALCIUM-MAGNESIUM PO Take 1 tablet by mouth daily.    . cetirizine (ZYRTEC) 10 MG tablet TAKE 1 TABLET(10 MG)  BY MOUTH DAILY 90 tablet 3  . gabapentin (NEURONTIN) 300 MG capsule Take 1-2 pills twice a day 360 capsule 3  . letrozole (FEMARA) 2.5 MG tablet Take 1 tablet (2.5 mg total) by mouth daily. 90 tablet 3  . levothyroxine (SYNTHROID, LEVOTHROID) 88 MCG tablet TAKE 1 TABLET DAILY 90 tablet 4  . losartan-hydrochlorothiazide  (HYZAAR) 100-25 MG tablet TAKE 1 TABLET EVERY MORNING BEFORE BREAKFAST 90 tablet 1  . Multiple Vitamins-Minerals (MULTIVITAMINS THER. W/MINERALS) TABS Take 1 tablet by mouth daily.    Marland Kitchen omeprazole (PRILOSEC) 40 MG capsule TAKE 1 CAPSULE DAILY 90 capsule 0  . potassium chloride SA (K-DUR,KLOR-CON) 20 MEQ tablet TAKE 1 TABLET DAILY 90 tablet 1  . Probiotic Product (PROBIOTIC DAILY PO) Take 1 capsule by mouth daily.     . Teriflunomide (AUBAGIO) 14 MG TABS Take by mouth.    . vitamin E 400 UNIT capsule Take 800 Units by mouth daily.     No current facility-administered medications for this visit.     PHYSICAL EXAMINATION: ECOG PERFORMANCE STATUS: 1 - Symptomatic but completely ambulatory  Vitals:   06/26/18 1450  BP: 135/70  Pulse: 73  Resp: 18  Temp: 98.1 F (36.7 C)  SpO2: 99%   Filed Weights   06/26/18 1450  Weight: 231 lb 11.2 oz (105.1 kg)    GENERAL:alert, no distress and comfortable SKIN: skin color, texture, turgor are normal, no rashes or significant lesions EYES: normal, Conjunctiva are pink and non-injected, sclera clear OROPHARYNX:no exudate, no erythema and lips, buccal mucosa, and tongue normal  NECK: supple, thyroid normal size, non-tender, without nodularity LYMPH:  no palpable lymphadenopathy in the cervical, axillary or inguinal LUNGS: clear to auscultation and percussion with normal breathing effort HEART: regular rate & rhythm and no murmurs and no lower extremity edema ABDOMEN:abdomen soft, non-tender and normal bowel sounds MUSCULOSKELETAL:no cyanosis of digits and no clubbing  NEURO: alert & oriented x 3 with fluent speech, no focal motor/sensory deficits EXTREMITIES: No lower extremity edema BREAST: No palpable masses or nodules in either right  Breasts.  No palpable lumps or nodules in the left chest wall or axilla no palpable axillary supraclavicular or infraclavicular adenopathy no breast tenderness or nipple discharge. (exam performed in the presence  of a chaperone)  LABORATORY DATA:  I have reviewed the data as listed CMP Latest Ref Rng & Units 05/31/2018 04/02/2018 06/26/2017  Glucose 70 - 99 mg/dL - 153(H) 133(H)  BUN 6 - 23 mg/dL - 21 19  Creatinine 0.40 - 1.20 mg/dL - 1.27(H) 1.29(H)  Sodium 135 - 145 mEq/L - 137 133(L)  Potassium 3.5 - 5.1 mEq/L - 3.3(L) 3.5  Chloride 96 - 112 mEq/L - 100 97(L)  CO2 19 - 32 mEq/L - 27 24  Calcium 8.4 - 10.5 mg/dL - 9.4 9.5  Total Protein 6.0 - 8.5 g/dL 7.3 7.1 6.8  Total Bilirubin 0.0 - 1.2 mg/dL 0.6 0.7 0.5  Alkaline Phos 39 - 117 IU/L 200(H) 155(H) -  AST 0 - 40 IU/L _0 ALT 0 - 32 IU/L _1 Lab Results  Component Value Date   WBC 6.3 04/02/2018   HGB 10.3 (L) 04/02/2018   HCT 32.2 (L) 04/02/2018   MCV 68.1 Repeated and verified X2. (L) 04/02/2018   PLT 304.0 04/02/2018   NEUTROABS 5,821 06/26/2017    ASSESSMENT & PLAN:  Breast cancer of upper-outer quadrant of right female breast ([place]) Left breast invasive ductal carcinoma with DCIS ER/PR  positive HER-2 positive: Status post mastectomy, adjuvant chemotherapy in 1 year of Herceptin. Currently on letrozole adjuvant hormonal therapy started 03/13/13 .   Letrozole toxicities: 1. No major hot flashes or myalgias. We discussed the role of extended adjuvant therapy.  She is willing to take antiestrogen treatment for a total of 7 years.  Breast cancer surveillance: 1. Breast exam done 06/26/2018: Benign 2. Mammogram 07/05/2017: Benign breast density category B, she schedule her next mammogram already Patient [facility]  We will try to obtain that report.  Multiple sclerosis:Causing fatigue and body pains. Patient was offered a treatment for MS but she does not want to take it because one of the Adverse effects was neutropenia.  She is not sure whether she wants to take that medicine.  Anxiety/depression: On Wellbutrin  Return to clinic in1 yearfor follow-up.   No orders of the  defined types were placed in this encounter.  The patient has a good understanding of the overall plan. she agrees with it. she will call with any problems that may develop before the next visit here.  Nicholas Lose, MD 06/26/2018   I, Cloyde Reams Dorshimer, am acting as scribe for Nicholas Lose, MD.  I have reviewed the above documentation for accuracy and completeness, and I agree with the above.

## 2018-06-26 ENCOUNTER — Inpatient Hospital Stay: Payer: Commercial Managed Care - PPO | Attending: Hematology and Oncology | Admitting: Hematology and Oncology

## 2018-06-26 ENCOUNTER — Telehealth: Payer: Self-pay | Admitting: Hematology and Oncology

## 2018-06-26 ENCOUNTER — Telehealth: Payer: Self-pay

## 2018-06-26 DIAGNOSIS — Z79899 Other long term (current) drug therapy: Secondary | ICD-10-CM | POA: Insufficient documentation

## 2018-06-26 DIAGNOSIS — Z923 Personal history of irradiation: Secondary | ICD-10-CM | POA: Diagnosis not present

## 2018-06-26 DIAGNOSIS — F329 Major depressive disorder, single episode, unspecified: Secondary | ICD-10-CM | POA: Insufficient documentation

## 2018-06-26 DIAGNOSIS — Z9011 Acquired absence of right breast and nipple: Secondary | ICD-10-CM | POA: Diagnosis not present

## 2018-06-26 DIAGNOSIS — Z79811 Long term (current) use of aromatase inhibitors: Secondary | ICD-10-CM | POA: Diagnosis not present

## 2018-06-26 DIAGNOSIS — Z9221 Personal history of antineoplastic chemotherapy: Secondary | ICD-10-CM | POA: Diagnosis not present

## 2018-06-26 DIAGNOSIS — G35 Multiple sclerosis: Secondary | ICD-10-CM | POA: Insufficient documentation

## 2018-06-26 DIAGNOSIS — Z170 Estrogen receptor positive status [ER+]: Secondary | ICD-10-CM | POA: Diagnosis not present

## 2018-06-26 DIAGNOSIS — F418 Other specified anxiety disorders: Secondary | ICD-10-CM

## 2018-06-26 DIAGNOSIS — C50411 Malignant neoplasm of upper-outer quadrant of right female breast: Secondary | ICD-10-CM | POA: Insufficient documentation

## 2018-06-26 MED ORDER — LETROZOLE 2.5 MG PO TABS: 2.5000 mg | ORAL_TABLET | Freq: Every day | ORAL | 3 refills | Status: DC

## 2018-06-26 NOTE — Assessment & Plan Note (Addendum)
Left breast invasive ductal carcinoma with DCIS ER/PR positive HER-2 positive: Status post mastectomy, adjuvant chemotherapy in 1 year of Herceptin. Currently on letrozole adjuvant hormonal therapy started 03/13/13 .   Letrozole toxicities: 1. No major hot flashes or myalgias. We discussed the role of extended adjuvant therapy.  She is willing to take antiestrogen treatment for a total of 7 years.  Breast cancer surveillance: 1. Breast exam done 06/26/2018 2. Mammogram 07/05/2017: Benign breast density category B   Multiple sclerosis:Causing fatigue and body pains. Patient was offered a treatment for MS but she does not want to take it because one of the Adverse effects was neutropenia.  She is not sure whether she wants to take that medicine.  Anxiety/depression: On Wellbutrin  Return to clinic in1 yearfor follow-up.

## 2018-06-26 NOTE — Telephone Encounter (Signed)
Per Dr.Gudena, obtain bone density report from [facility] today. Called and spoke with medical records and provided fax number to send report to.

## 2018-06-26 NOTE — Telephone Encounter (Signed)
Gave avs and calendar ° °

## 2018-07-16 ENCOUNTER — Other Ambulatory Visit: Payer: Self-pay | Admitting: *Deleted

## 2018-07-16 DIAGNOSIS — G4733 Obstructive sleep apnea (adult) (pediatric): Secondary | ICD-10-CM

## 2018-07-16 NOTE — Telephone Encounter (Signed)
Faxed signed order to [facility] at [phone]. Received fax confirmation. 

## 2018-07-26 ENCOUNTER — Telehealth: Payer: Self-pay | Admitting: *Deleted

## 2018-07-26 NOTE — Telephone Encounter (Signed)
Faxed signed order to [facility] at [phone] for cpap supplies. Received fax confirmation

## 2018-08-01 ENCOUNTER — Other Ambulatory Visit: Payer: Self-pay | Admitting: Family Medicine

## 2018-08-01 ENCOUNTER — Other Ambulatory Visit: Payer: Self-pay

## 2018-08-02 NOTE — Telephone Encounter (Signed)
 Database Verified LR: 05-20-18 Qty: 90 Pending appointment: No pending appointment

## 2018-08-03 ENCOUNTER — Other Ambulatory Visit: Payer: Self-pay | Admitting: Neurology

## 2018-08-03 MED ORDER — ALPRAZOLAM 0.5 MG PO TABS: 0.5000 mg | ORAL_TABLET | Freq: Three times a day (TID) | ORAL | 0 refills | Status: DC | PRN

## 2018-08-08 ENCOUNTER — Other Ambulatory Visit: Payer: Self-pay | Admitting: *Deleted

## 2018-08-08 MED ORDER — ALPRAZOLAM 0.5 MG PO TABS: 0.5000 mg | ORAL_TABLET | Freq: Three times a day (TID) | ORAL | 0 refills | Status: DC | PRN

## 2018-08-15 DIAGNOSIS — G4733 Obstructive sleep apnea (adult) (pediatric): Secondary | ICD-10-CM | POA: Diagnosis not present

## 2018-08-17 DIAGNOSIS — M4807 Spinal stenosis, lumbosacral region: Secondary | ICD-10-CM | POA: Diagnosis not present

## 2018-08-21 DIAGNOSIS — Z01419 Encounter for gynecological examination (general) (routine) without abnormal findings: Secondary | ICD-10-CM | POA: Diagnosis not present

## 2018-08-21 DIAGNOSIS — Z23 Encounter for immunization: Secondary | ICD-10-CM | POA: Diagnosis not present

## 2018-08-21 DIAGNOSIS — Z1231 Encounter for screening mammogram for malignant neoplasm of breast: Secondary | ICD-10-CM | POA: Diagnosis not present

## 2018-08-21 DIAGNOSIS — Z6838 Body mass index (BMI) 38.0-38.9, adult: Secondary | ICD-10-CM | POA: Diagnosis not present

## 2018-08-22 ENCOUNTER — Telehealth: Payer: Self-pay | Admitting: Neurology

## 2018-08-22 NOTE — Telephone Encounter (Signed)
I called pt and scheduled work in appt for 08/24/18 at [facility]. She will get MRI CD to bring with her to f/u.

## 2018-08-22 NOTE — Telephone Encounter (Signed)
Spoke with Dr. Felecia Shelling- he is ok to work her in for an appt this Friday at [facility]. She should bring MRI CD with her that was done by orthopaedic for him to review

## 2018-08-22 NOTE — Telephone Encounter (Signed)
Pt has seen Orthopaedic recently due to back and was advised the MRI from last year did not look right on the lower spine. She said she's had a sudden onset of pain in the back that started about 1 month ago progressively getting worse. She is unable to walk due to severe pain in the buttocks and the low spine radiating down the legs, relief is with lying down or sitting. She is wanting to know if this could be a flare up this time with her MS since it came in suddenly. Pt has appt on 5/27 but is requesting to be seen sooner. Please call to advise

## 2018-08-24 ENCOUNTER — Ambulatory Visit: Payer: Commercial Managed Care - PPO | Admitting: Neurology

## 2018-08-24 ENCOUNTER — Encounter: Payer: Self-pay | Admitting: Neurology

## 2018-08-24 VITALS — BP 133/82 | HR 65 | Ht 66.0 in | Wt 237.0 lb

## 2018-08-24 DIAGNOSIS — M4316 Spondylolisthesis, lumbar region: Secondary | ICD-10-CM | POA: Diagnosis not present

## 2018-08-24 DIAGNOSIS — Z170 Estrogen receptor positive status [ER+]: Secondary | ICD-10-CM

## 2018-08-24 DIAGNOSIS — M47816 Spondylosis without myelopathy or radiculopathy, lumbar region: Secondary | ICD-10-CM | POA: Insufficient documentation

## 2018-08-24 DIAGNOSIS — C50411 Malignant neoplasm of upper-outer quadrant of right female breast: Secondary | ICD-10-CM | POA: Diagnosis not present

## 2018-08-24 DIAGNOSIS — G35 Multiple sclerosis: Secondary | ICD-10-CM

## 2018-08-24 NOTE — Progress Notes (Signed)
[facility]  PATIENT: Julie Ross DOB: 1961/06/04  REFERRING DOCTOR OR PCP:  Julie Ross SOURCE: patient and records from [facility]  _________________________________   HISTORICAL  CHIEF COMPLAINT:  Chief Complaint  Patient presents with  . Multiple Sclerosis    Room 12. She is here with her husband, Julie Ross.  She decided to not start Aubagio due to the potential side effects.  . Worsening back pain    Reports worsening pain in her buttock area that radiates into her bilateral legs.  She is more comfortable with sitting or lying down.     HISTORY OF PRESENT ILLNESS:  Julie Ross is a 58 y.o. woman with multiple sclerosis and breast cancer.    Update 08/24/2018: She reports doing well with her breast cancer.  She also appears to be stable in regards to her multiple sclerosis with no exacerbations.  She remains off of a disease modifying therapy.  We had discussed Aubagio at the last visit as the MRI of the brain performed in late 2019 showed one new lesion not present on the 2017 MRI consistent with evidence of activity.  She is reporting buttock pain that radiates down her legs when she moves, especially rolling over in bed or stairs (especially downstairs where she is now crawling backwards).  Pain goes from the lower back to the buttock region and proximal legs but not beyond the knees.  Standing straight is worse and she bends forward when she walks.     Sitting is not painful.  Pain is midline in the back.   Initially pain was midline and she had facet blocks at [facility] that did not help much.   She then had SI joint injection 05/11/18 at [facility] (CT guided).  She does not think it has helped and in fact is having more pain now than she did a couple months ago  MRi of the lumbar Raliegh Ip) report and images from 03/24/2017 were personally reviewed.  It showed severe L4-L5 facet hypertrophy associated with 1 to 2 mm anterolisthesis  and some lateral recess stenosis but not significant foraminal narrowing.  She has mild to moderate facet hypertrophy at L3-L4 and L5-S1 and there is some foraminal narrowing to the left at L3-L4 encroaching on the L3 nerve root without compressing it.  I showed the relevant MRI changes to Julie Ross and her husband.  Update 03/30/2018 She has not been on any DMT since chemotherapy for breast cancer (treated with surgery, chemotherapy and RadRx).    She feels her MS has been stable.   She notes her gait is poor but her knees (has had bilateral TKR complicated by infections).    She does do PT and exercises twice weekly.    She has no recent falls.   Strength in legs is normal and she has no numbness.   Bladder is fine.   Vision is stable (wears correction)  She notes short term memory is worse and she does not always remember what she read.    She has some verbal fluency issues and flips words around at times.   This frustrates her.    She has mild depression that is stable.   Late at night if in pain, she gets more depressed.   She stays up late until 2 am and gets up at 9 am and often takes a late morning nap.   She feels she could sleep all day.    She has moderately severe OSA  and needs a FF mask (mouth breather).   She had difficulty with the standard mask.   She would like to try the Dreamwear FF mask.   She gets LBP that stays axial.    She denies sciatica.   Worst pain is in the left SI/piriformis region.  Pain is worse with standing.   She has L5S1 facet hypertrophy but facet joint injections did not help.   Ibuprofen has not helped much.     Update 08/21/2017:    She is reporting more pain in both. This bothers her mostly at night. It is not definitely associated with spasticity. Moving around does not make it much better. Occasions taken at night include Xanax, baclofen and gabapentin. At one time she also took hydrocodone sometimes at night, but not recently.     She has CPAP but has not been using it  lately. The PSG in the past showed moderately severe OSA with AHI equals 33.    She also had restless legs and periodic limb movements.          She feels gait is stable. The balance is sometimes off but she has not had any recent falls. She does stumble some. Vision is doing the same. She has a history of optic neuritis on the left. Bladder function is fine.  Mood is fine.  Focus and attention mildly reduced.   From 01/17/2017: MS:   She is currently off a disease modifying therapy. Her MRI July 2017 was stable.The right TKR got infected and she required removal of hardware. She feels it is doing much better. She is still reluctant to go on a DMT due to risk of infection.   Leg aching:   Her entire leg (not just knee) is aching at night and that wakes her up.    Shifting has not helped much.   Getting out of bed and walking does not help but a heating pad does.  This only bothers her at night.   It does not bother her if she sits a long time.     She takes baclofen , xanax and gabapentin 600 mg at bedtime and sometimes takes another one at 3-4 am.   The gabapentin seems to help but the second dose gives her a hangover until noon.    Ear sensation/lightheaded:   She gets outs of lightheadedness that often start with a stuffed up sensation in her right ear.   Mood/cognitive:    She notes depression and some anxiety.   She is apathetic.       She notes reduced focus and attention and sometime mild memory issues.   Her nose bled a lot with Flonase.   She already takes Zyrtec  Fatigue/sleep:   She has fatigue that is both physical and Julie. The fatigue is present when she wakes up but worsens as the day goes on. Of note, she has severe OSA (AHI = 33) diagnosed 11/20/2013.   She was placed on Auto-PAP .  She recently tried the Dreamwear mask and feels it is easier to wear than other masks.    She also has moderate RLS with some impact on sleep, helped by nighttime gabapentin 300 mg and baclofen and xanax at  bedtime.      During the day she is very sleepy but then has trouble falling asleep at night.      Provigil made her jittery.   Phentermine helped the sleepiness but she had mild stomach upset.  Gait/strength/sensation:  Gait issues are mild and mostly orthopedic in nature .   Her legs fatigue easily.   She occasioanally trips over her feet and she broke her 5th toe once.    She denies any leg numbness.  Vision:   She had left optic neuritis in 2012. Left visual acuity improved but not to baseline.   Colors are desaturated on the left, esp when tired  Bladder:   Bladder function is fine.    MS History:    She was diagnosed with MS in 1999 after presenting with optic neuritis.   She had a spinal tap, MRI and angiogram.   Initially, the diagnosis was uncertain but she had changes in the MRI over time leading to a diagnosis of MS a few years later.    Initially, she was placed on Copaxone, then switched to Avonex due to skin reactions.   She then switched to Tysabri when she had some mild breakthrough disease. She tolerated Tysabri very well and her MS did well.  However, she was JCV antibody positive so she stopped in 2012.   She did not restart Avonex as she did not want to go back to an injection.    Her last exacerbation was optic neuritis in 2012 treated with steroids. She proved but did not get back to baseline.  This exacerbation occurred off any DMT.  She tried Gilenya in 2013. She had trouble tolerating Gilenya and discontinued shortly after starting it. She was going to start Tecfidera but was diagnosed with breast cancer in 2013 and opted not to start at that time. She received chemotherapy including Taxotere, carboplatin, Herceptin in 2013 and 2014. An MRI of the brain in 2015 showed only mild MS progression compared to her previous one in 2011.      REVIEW OF SYSTEMS: Constitutional: No fevers, chills, sweats, or change in appetite.  Notes fatigue Eyes: No visual changes, double vision, eye  pain Ear, nose and throat: No hearing loss, ear pain, nasal congestion, sore throat.  Recent URI Cardiovascular: No chest pain, palpitations Respiratory: No shortness of breath at rest or with exertion.   No wheezes.  Has OSA GastrointestinaI: No nausea, vomiting, diarrhea, abdominal pain, fecal incontinence Genitourinary: No dysuria, urinary retention or frequency.  She has 1 times nocturia most nights Musculoskeletal: No neck pain, back pain.  She has bilateral knee pain Integumentary: No rash, pruritus, skin lesions Neurological: as above Psychiatric: No depression at this time.  Some anxiety Endocrine: No palpitations, diaphoresis, change in appetite, change in weigh or increased thirst Hematologic/Lymphatic: No anemia, purpura, petechiae. Allergic/Immunologic: No itchy/runny eyes, nasal congestion, recent allergic reactions, rashes  ALLERGIES: Allergies  Allergen Reactions  . Bee Venom Anaphylaxis    HOME MEDICATIONS:  Current Outpatient Medications:  .  ALPRAZolam (XANAX) 0.5 MG tablet, Take 1 tablet (0.5 mg total) by mouth 3 (three) times daily as needed. for anxiety, Disp: 270 tablet, Rfl: 0 .  amLODipine (NORVASC) 10 MG tablet, TAKE 1 TABLET DAILY, Disp: 90 tablet, Rfl: 0 .  atorvastatin (LIPITOR) 20 MG tablet, TAKE 1 TABLET DAILY, Disp: 90 tablet, Rfl: 1 .  baclofen (LIORESAL) 10 MG tablet, TAKE 1 TABLET THREE TIMES A DAY AS NEEDED FOR MUSCLE SPASMS, Disp: 270 tablet, Rfl: 3 .  buPROPion (WELLBUTRIN XL) 150 MG 24 hr tablet, TAKE 1 TABLET DAILY, Disp: 90 tablet, Rfl: 4 .  CALCIUM-MAGNESIUM PO, Take 1 tablet by mouth daily., Disp: , Rfl:  .  cetirizine (ZYRTEC) 10 MG tablet, TAKE 1 TABLET(10  MG) BY MOUTH DAILY, Disp: 90 tablet, Rfl: 3 .  gabapentin (NEURONTIN) 300 MG capsule, Take 1-2 pills twice a day, Disp: 360 capsule, Rfl: 3 .  letrozole (FEMARA) 2.5 MG tablet, Take 1 tablet (2.5 mg total) by mouth daily., Disp: 90 tablet, Rfl: 3 .  levothyroxine (SYNTHROID,  LEVOTHROID) 88 MCG tablet, TAKE 1 TABLET DAILY, Disp: 90 tablet, Rfl: 4 .  losartan-hydrochlorothiazide (HYZAAR) 100-25 MG tablet, TAKE 1 TABLET EVERY MORNING BEFORE BREAKFAST, Disp: 90 tablet, Rfl: 1 .  Multiple Vitamins-Minerals (MULTIVITAMINS THER. W/MINERALS) TABS, Take 1 tablet by mouth daily., Disp: , Rfl:  .  omeprazole (PRILOSEC) 40 MG capsule, TAKE 1 CAPSULE DAILY, Disp: 90 capsule, Rfl: 0 .  potassium chloride SA (K-DUR,KLOR-CON) 20 MEQ tablet, TAKE 1 TABLET DAILY, Disp: 90 tablet, Rfl: 1 .  Probiotic Product (PROBIOTIC DAILY PO), Take 1 capsule by mouth daily. , Disp: , Rfl:  .  vitamin E 400 UNIT capsule, Take 800 Units by mouth daily., Disp: , Rfl:   PAST MEDICAL HISTORY: Past Medical History:  Diagnosis Date  . Anemia    h/o - ? bld. transfusion - more recent- ?2013  . Anxiety    uses xanax mainly for sleep  . Arthritis    knees   . Breast cancer ([place]) 11/2011   lul/ER+PR+, x1 lymph node   . Cancer (HCC)    squamous cell skin cancer x7  . Depression   . GERD (gastroesophageal reflux disease)   . H/O bone density study 03/2011  . H/O colonoscopy   . H/O echocardiogram    last one 02/2013, due to chemotherapy  . Heart burn   . Hx of radiation therapy 04/17/12- 06/04/12   left chest wall, axilla, L supraclavicular fossa 4500 cGy, left mastectomy scar/chest wall 5940 cGy  . Hypertension   . Hypothyroidism   . Multiple sclerosis ([place])   . Neuromuscular disorder ([place])    MS TX WITH CYMBALTA   . Personal history of chemotherapy   . Personal history of radiation therapy   . Sleep apnea    MILD SLEEP APNEA, NO (Needed) MACHINE  6 YRS AGO HPT [facility]   . Wears glasses     PAST SURGICAL HISTORY: Past Surgical History:  Procedure Laterality Date  . CESAREAN SECTION     X2   . COLONOSCOPY    . ESOPHAGOGASTRODUODENOSCOPY    . FLEXIBLE SIGMOIDOSCOPY    . I&D KNEE WITH POLY EXCHANGE  03/02/2012   Procedure: IRRIGATION AND DEBRIDEMENT KNEE WITH POLY EXCHANGE;  Surgeon:  Alta Corning, MD;  Location: [place];  Service: Orthopedics;  Laterality: Left;  I&D of total knee with Possible Poly Exchange   . JOINT REPLACEMENT  Feb 2013   left knee- multiple surgeries   . KNEE ARTHROSCOPY     LT KNEE 04/2011  . KNEE ARTHROSCOPY  84   rt  . MASTECTOMY    . MASTECTOMY W/ SENTINEL NODE BIOPSY  12/19/2011   Procedure: MASTECTOMY WITH SENTINEL LYMPH NODE BIOPSY;  Surgeon: Haywood Lasso, MD;  Location: [place];  Service: General;  Laterality: Left;  left breast and left axilla   . MOHS SURGERY     right face  . NO PAST SURGERIES     BLADDER SLING  4-5 YRS AGO   . PORT-A-CATH REMOVAL  03/06/2012   Procedure: REMOVAL PORT-A-CATH;  Surgeon: Odis Hollingshead, MD;  Location: [place];  Service: General;  Laterality: N/A;  . PORT-A-CATH REMOVAL Right 08/22/2012  Procedure: REMOVAL PORT-A-CATH;  Surgeon: Edward Jolly, MD;  Location: [place];  Service: General;  Laterality: Right;  . PORTACATH PLACEMENT  01/16/2012   Procedure: INSERTION PORT-A-CATH;  Surgeon: Haywood Lasso, MD;  Location: [place];  Service: General;  Laterality: Right;  [place] Cath Placement   . PORTACATH PLACEMENT  05/01/2012   Procedure: INSERTION PORT-A-CATH;  Surgeon: Haywood Lasso, MD;  Location: [place];  Service: General;  Laterality: Right;  right internal jugular port-a-cath insertion  . TEE WITHOUT CARDIOVERSION  03/06/2012   Procedure: TRANSESOPHAGEAL ECHOCARDIOGRAM (TEE);  Surgeon: Josue Hector, MD;  Location: [place];  Service: Cardiovascular;  Laterality: N/A;  Rm. [id number]  . TONSILLECTOMY    . TOTAL KNEE ARTHROPLASTY  08/15/2011   Procedure: TOTAL KNEE ARTHROPLASTY; lft Surgeon: Alta Corning, MD;  Location: [place];  Service: Orthopedics;  Laterality: Left;  RIGHT KNEE CORTIZONE INJECTION  . TOTAL KNEE ARTHROPLASTY Left 08/18/2012   Procedure:  Irrigation and debridement of LEFT total knee;  Removal of total knee parts; Implant of spacers;  Surgeon:  Kerin Salen, MD;  Location: [place];  Service: Orthopedics;  Laterality: Left;  . TOTAL KNEE ARTHROPLASTY Right 08/12/2013  . TOTAL KNEE ARTHROPLASTY Right 08/12/2013   Procedure: RIGHT TOTAL KNEE ARTHROPLASTY;  Surgeon: Kerin Salen, MD;  Location: [place];  Service: Orthopedics;  Laterality: Right;  . TOTAL KNEE REVISION Left 04/19/2013   Procedure: TOTAL KNEE REVISION AND REMOVAL CEMENT SPACER;  Surgeon: Kerin Salen, MD;  Location: [place];  Service: Orthopedics;  Laterality: Left;  . TUMOR REMOVAL     FROM [place] AGE 72    FAMILY HISTORY: Family History  Problem Relation Age of Onset  . Breast cancer Paternal Aunt        dx in her 44s  . Heart disease Maternal Grandfather   . Lung cancer Maternal Uncle        smoker  . Breast cancer Paternal Grandmother        dx in her 31s  . Ovarian cancer Paternal Aunt        dx in her 53s  . Arthritis Mother   . Hypertension Mother   . Heart disease Mother        CHF  . Dementia Father   . Diabetes type II Father     SOCIAL HISTORY:  Social History   Socioeconomic History  . Marital status: Married    Spouse name: Not on file  . Number of children: 2  . Years of education: Not on file  . Highest education level: Not on file  Occupational History    Employer: [employer]  . Financial resource strain: Not on file  . Food insecurity:    Worry: Not on file    Inability: Not on file  . Transportation needs:    Medical: Not on file    Non-medical: Not on file  Tobacco Use  . Smoking status: Never Smoker  . Smokeless tobacco: Never Used  Substance and Sexual Activity  . Alcohol use: No  . Drug use: No  . Sexual activity: Yes  Lifestyle  . Physical activity:    Days per week: Not on file    Minutes per session: Not on file  . Stress: Not on file  Relationships  . Social connections:    Talks on phone: Not on file    Gets together: Not on file  Attends religious service: Not on file    Active member  of club or organization: Not on file    Attends meetings of clubs or organizations: Not on file    Relationship status: Not on file  . Intimate partner violence:    Fear of current or ex partner: Not on file    Emotionally abused: Not on file    Physically abused: Not on file    Forced sexual activity: Not on file  Other Topics Concern  . Not on file  Social History Narrative  . Not on file     PHYSICAL EXAM  Vitals:   08/24/18 0843  BP: 133/82  Pulse: 65  Weight: 237 lb (107.5 kg)  Height: 5\' 6"  (1.676 m)    Body mass index is 38.25 kg/m.   General: The patient is well-developed and well-nourished and in no acute distress.   Reduced range of motion of the lower back.  She is tender in the lower lumbar paraspinal region and over the piriformis/SI region bilaterally   Neurologic Exam  Julie status: The patient is alert and oriented x 3 at the time of the examination. The patient has apparent normal recent and remote memory, with an apparently normal attention span and concentration ability.   Speech is normal.  Cranial nerves: Extraocular movements are full.  Facial strength and sensation is normal. The tongue is midline, and the patient has symmetric elevation of the soft palate. No obvious hearing deficits are noted.  Motor:  Muscle bulk is normal.   Muscle tone is normal.  Strength appears to be normal in the legs.  Sensory: She has intact sensation to touch and vibration in the arms or legs..  Coordination: Finger-nose-finger is performed well...  Gait and station: Station is normal.   Gait is arthritic and tandem gait is wide.  She leans forward some as she walks.. The Romberg is negative.  Reflexes: Deep tendon reflexes are symmetric and normal bilaterally in arms.  DTRs are absent at the ankles.  Other:   She has more pain upon straightening her back and less pain leaning forward.    DIAGNOSTIC DATA (LABS, IMAGING, TESTING) - I reviewed patient records,  labs, notes, testing and imaging myself where available.  Lab Results  Component Value Date   WBC 6.3 04/02/2018   HGB 10.3 (L) 04/02/2018   HCT 32.2 (L) 04/02/2018   MCV 68.1 Repeated and verified X2. (L) 04/02/2018   PLT 304.0 04/02/2018      Component Value Date/Time   NA 137 04/02/2018 1440   NA 136 (A) 09/15/2016   NA 138 08/18/2014 1551   K 3.3 (L) 04/02/2018 1440   K 3.7 08/18/2014 1551   CL 100 04/02/2018 1440   CL 100 11/30/2012 1029   CO2 27 04/02/2018 1440   CO2 27 08/18/2014 1551   GLUCOSE 153 (H) 04/02/2018 1440   GLUCOSE 117 08/18/2014 1551   GLUCOSE 91 11/30/2012 1029   BUN 21 04/02/2018 1440   BUN 17.2 08/18/2014 1551   CREATININE 1.27 (H) 04/02/2018 1440   CREATININE 1.29 (H) 06/26/2017 1603   CREATININE 1.3 (H) 08/18/2014 1551   CALCIUM 9.4 04/02/2018 1440   CALCIUM 9.1 08/18/2014 1551   PROT 7.3 05/31/2018 1545   PROT 7.1 08/18/2014 1551   ALBUMIN 4.6 05/31/2018 1545   ALBUMIN 4.0 08/18/2014 1551   AST 19 05/31/2018 1545   AST 19 08/18/2014 1551   ALT 15 05/31/2018 1545   ALT 21 08/18/2014 1551  ALKPHOS 200 (H) 05/31/2018 1545   ALKPHOS 137 08/18/2014 1551   BILITOT 0.6 05/31/2018 1545   BILITOT 0.46 08/18/2014 1551   GFRNONAA 47 (L) 12/09/2014 1110   GFRAA 55 (L) 12/09/2014 1110       ASSESSMENT AND PLAN  Multiple sclerosis (HCC)  Spondylolisthesis at L4-L5 level  Facet syndrome, lumbar  Malignant neoplasm of upper-outer quadrant of right breast in female, estrogen receptor positive ([place])   1.   For her MS, she would prefer to stay off of a disease modifying therapy at this time.  We would need to check another MRI sometime in the upcoming year.  If further disease activity is noted, I will more strongly recommend going back on therapy  2.   We had a very long discussion about her pain.  Her symptoms are most consistent with a significant facet syndrome, possibly with superimposed spinal stenosis, likely due to degenerative changes at  L4-L5.  The last MRI is about 38 months old and changes may have worsened.  I recommend that she have an x-ray of the lumbar spine with flexion and extension views to see if the anterolisthesis at L4-L5 is stable or unstable.  If unstable, surgery will need to be considered.  I also recommend an MRI of the lumbar spine to help guide further therapy and determine if changes at L4-L5 have worsened or if there are new problems.  If the changes at L4-L5 is stable, consider medial branch blocks with radiofrequency ablation.  Her daughter works in an orthopedic office and she is going to ask her to arrange the studies. 3.    he will return to see me in 6 months or sooner if she has new or worsening neurologic symptoms.   45 minute face-to-face evaluation with greater than one half the time counseling and coordinating care about her MS and pain/spine issues.  Richard A. Felecia Shelling, MD, PhD 08/19/9516, 84:16 AM Certified in Neurology, Clinical Neurophysiology, Sleep Medicine, Pain Medicine and Neuroimaging  [facility] [address] [place], [place] [address] [phone]

## 2018-08-25 ENCOUNTER — Other Ambulatory Visit: Payer: Self-pay | Admitting: Family Medicine

## 2018-08-27 NOTE — Telephone Encounter (Signed)
Dr. Zigmund Daniel please advise  As far as the prilosec per your last note you wanted them to try pantoprazole did you want to refill? Also Amlodipine was last filled 05/28/18 #90 by Dr. Birdie Riddle

## 2018-08-29 ENCOUNTER — Encounter: Payer: Self-pay | Admitting: Family Medicine

## 2018-09-04 DIAGNOSIS — M48061 Spinal stenosis, lumbar region without neurogenic claudication: Secondary | ICD-10-CM | POA: Diagnosis not present

## 2018-09-04 DIAGNOSIS — M4316 Spondylolisthesis, lumbar region: Secondary | ICD-10-CM | POA: Diagnosis not present

## 2018-09-04 DIAGNOSIS — M545 Low back pain: Secondary | ICD-10-CM | POA: Diagnosis not present

## 2018-09-23 ENCOUNTER — Other Ambulatory Visit: Payer: Self-pay | Admitting: Family Medicine

## 2018-09-24 ENCOUNTER — Other Ambulatory Visit: Payer: Self-pay | Admitting: Family Medicine

## 2018-10-09 ENCOUNTER — Other Ambulatory Visit: Payer: Self-pay | Admitting: Neurology

## 2018-10-31 ENCOUNTER — Other Ambulatory Visit: Payer: Self-pay | Admitting: *Deleted

## 2018-10-31 DIAGNOSIS — M47816 Spondylosis without myelopathy or radiculopathy, lumbar region: Secondary | ICD-10-CM | POA: Diagnosis not present

## 2018-10-31 DIAGNOSIS — M545 Low back pain: Secondary | ICD-10-CM | POA: Diagnosis not present

## 2018-10-31 DIAGNOSIS — M48061 Spinal stenosis, lumbar region without neurogenic claudication: Secondary | ICD-10-CM | POA: Diagnosis not present

## 2018-10-31 MED ORDER — ALPRAZOLAM 0.5 MG PO TABS: 0.5000 mg | ORAL_TABLET | Freq: Three times a day (TID) | ORAL | 1 refills | Status: DC | PRN

## 2018-11-14 ENCOUNTER — Ambulatory Visit: Payer: Commercial Managed Care - PPO | Admitting: Neurology

## 2018-12-15 ENCOUNTER — Other Ambulatory Visit: Payer: Self-pay | Admitting: Family Medicine

## 2018-12-19 ENCOUNTER — Ambulatory Visit: Payer: Commercial Managed Care - PPO | Admitting: Neurology

## 2018-12-24 ENCOUNTER — Other Ambulatory Visit: Payer: Self-pay | Admitting: Physical Medicine and Rehabilitation

## 2018-12-24 DIAGNOSIS — M545 Low back pain, unspecified: Secondary | ICD-10-CM

## 2019-01-21 ENCOUNTER — Ambulatory Visit
Admission: RE | Admit: 2019-01-21 | Discharge: 2019-01-21 | Disposition: A | Discharge status: Home / Self Care | Payer: Commercial Managed Care - PPO | Source: Ambulatory Visit | Attending: Physical Medicine and Rehabilitation | Admitting: Physical Medicine and Rehabilitation

## 2019-01-21 DIAGNOSIS — M545 Low back pain, unspecified: Secondary | ICD-10-CM

## 2019-01-24 ENCOUNTER — Other Ambulatory Visit: Payer: Self-pay | Admitting: *Deleted

## 2019-01-28 ENCOUNTER — Other Ambulatory Visit: Payer: Self-pay | Admitting: Family Medicine

## 2019-01-30 ENCOUNTER — Other Ambulatory Visit: Payer: Self-pay | Admitting: Neurological Surgery

## 2019-02-01 ENCOUNTER — Other Ambulatory Visit (HOSPITAL_COMMUNITY): Payer: Self-pay | Admitting: Neurological Surgery

## 2019-02-01 ENCOUNTER — Other Ambulatory Visit: Payer: Self-pay | Admitting: Neurological Surgery

## 2019-02-01 DIAGNOSIS — M48061 Spinal stenosis, lumbar region without neurogenic claudication: Secondary | ICD-10-CM

## 2019-02-13 ENCOUNTER — Encounter: Payer: Self-pay | Admitting: Family Medicine

## 2019-02-14 ENCOUNTER — Ambulatory Visit (INDEPENDENT_AMBULATORY_CARE_PROVIDER_SITE_OTHER): Payer: Commercial Managed Care - PPO

## 2019-02-14 ENCOUNTER — Other Ambulatory Visit: Payer: Self-pay

## 2019-02-14 ENCOUNTER — Ambulatory Visit: Payer: Commercial Managed Care - PPO | Admitting: Family Medicine

## 2019-02-14 ENCOUNTER — Encounter: Payer: Self-pay | Admitting: Family Medicine

## 2019-02-14 DIAGNOSIS — Z23 Encounter for immunization: Secondary | ICD-10-CM

## 2019-02-14 NOTE — Progress Notes (Signed)
Patient presents in clinic today for Influenza vaccination. IM injection was given in the left deltoid. Patient tolerated the injection well. No signs or symptoms of a reaction were noted prior to patient leaving the nurse visit. 

## 2019-02-18 ENCOUNTER — Other Ambulatory Visit: Payer: Self-pay | Admitting: Dermatology

## 2019-02-20 ENCOUNTER — Ambulatory Visit: Payer: Commercial Managed Care - PPO | Admitting: Neurology

## 2019-02-20 ENCOUNTER — Other Ambulatory Visit: Payer: Self-pay

## 2019-02-20 VITALS — BP 98/58 | HR 81 | Temp 98.4°F | Ht 66.0 in | Wt 229.5 lb

## 2019-02-20 DIAGNOSIS — M4316 Spondylolisthesis, lumbar region: Secondary | ICD-10-CM

## 2019-02-20 DIAGNOSIS — M79604 Pain in right leg: Secondary | ICD-10-CM | POA: Diagnosis not present

## 2019-02-20 DIAGNOSIS — G35 Multiple sclerosis: Secondary | ICD-10-CM | POA: Diagnosis not present

## 2019-02-20 DIAGNOSIS — Z853 Personal history of malignant neoplasm of breast: Secondary | ICD-10-CM | POA: Diagnosis not present

## 2019-02-20 DIAGNOSIS — F411 Generalized anxiety disorder: Secondary | ICD-10-CM

## 2019-02-20 NOTE — Progress Notes (Signed)
[facility]  PATIENT: Julie Ross DOB: 1960/08/13  REFERRING DOCTOR OR PCP:  Annye Asa SOURCE: patient and records from [facility]  _________________________________   HISTORICAL  CHIEF COMPLAINT:  Chief Complaint  Patient presents with   Follow-up    RM 12. Last seen 08/24/18.     HISTORY OF PRESENT ILLNESS:  Julie Ross is a 58 y.o. woman with multiple sclerosis and breast cancer.    Update 02/20/2019: She is going to have surgery for the L4L5 degeneration (synovial cyst and spondylolisthesis).   She will have pedicle screw placement (Dr. Zada Finders), planned   Her MS is stable.    She is not on a DMT.   Gait is poor more due to pain than neurologic issues.  She denies numbness buthas the radiating pain form her back.   She gets left calf spasms.    She has  No bladder issues.     Sleep is poor.   She has truble getting comfortable.  Rolling over in bed is painful.   She takes alprazolam, gabapentin and baclofen at night.      Update 08/24/2018: She reports doing well with her breast cancer.  She also appears to be stable in regards to her multiple sclerosis with no exacerbations.  She remains off of a disease modifying therapy.  We had discussed Aubagio at the last visit as the MRI of the brain performed in late 2019 showed one new lesion not present on the 2017 MRI consistent with evidence of activity.  She is reporting buttock pain that radiates down her legs when she moves, especially rolling over in bed or stairs (especially downstairs where she is now crawling backwards).  Pain goes from the lower back to the buttock region and proximal legs but not beyond the knees.  Standing straight is worse and she bends forward when she walks.     Sitting is not painful.  Pain is midline in the back.   Initially pain was midline and she had facet blocks at [facility] that did not help much.   She then had SI joint injection 05/11/18 at [facility]  (CT guided).  She does not think it has helped and in fact is having more pain now than she did a couple months ago  MRi of the lumbar Raliegh Ip) report and images from 03/24/2017 were personally reviewed.  It showed severe L4-L5 facet hypertrophy associated with 1 to 2 mm anterolisthesis and some lateral recess stenosis but not significant foraminal narrowing.  She has mild to moderate facet hypertrophy at L3-L4 and L5-S1 and there is some foraminal narrowing to the left at L3-L4 encroaching on the L3 nerve root without compressing it.  I showed the relevant MRI changes to [facility] and her husband.  Update 03/30/2018 She has not been on any DMT since chemotherapy for breast cancer (treated with surgery, chemotherapy and RadRx).    She feels her MS has been stable.   She notes her gait is poor but her knees (has had bilateral TKR complicated by infections).    She does do PT and exercises twice weekly.    She has no recent falls.   Strength in legs is normal and she has no numbness.   Bladder is fine.   Vision is stable (wears correction)  She notes short term memory is worse and she does not always remember what she read.    She has some verbal fluency issues and flips words around  at times.   This frustrates her.    She has mild depression that is stable.   Late at night if in pain, she gets more depressed.   She stays up late until 2 am and gets up at 9 am and often takes a late morning nap.   She feels she could sleep all day.    She has moderately severe OSA and needs a FF mask (mouth breather).   She had difficulty with the standard mask.   She would like to try the Dreamwear FF mask.   She gets LBP that stays axial.    She denies sciatica.   Worst pain is in the left SI/piriformis region.  Pain is worse with standing.   She has L5S1 facet hypertrophy but facet joint injections did not help.   Ibuprofen has not helped much.     Update 08/21/2017:    She is reporting more pain in both. This bothers  her mostly at night. It is not definitely associated with spasticity. Moving around does not make it much better. Occasions taken at night include Xanax, baclofen and gabapentin. At one time she also took hydrocodone sometimes at night, but not recently.     She has CPAP but has not been using it lately. The PSG in the past showed moderately severe OSA with AHI equals 33.    She also had restless legs and periodic limb movements.          She feels gait is stable. The balance is sometimes off but she has not had any recent falls. She does stumble some. Vision is doing the same. She has a history of optic neuritis on the left. Bladder function is fine.  Mood is fine.  Focus and attention mildly reduced.   From 01/17/2017: MS:   She is currently off a disease modifying therapy. Her MRI July 2017 was stable.The right TKR got infected and she required removal of hardware. She feels it is doing much better. She is still reluctant to go on a DMT due to risk of infection.   Leg aching:   Her entire leg (not just knee) is aching at night and that wakes her up.    Shifting has not helped much.   Getting out of bed and walking does not help but a heating pad does.  This only bothers her at night.   It does not bother her if she sits a long time.     She takes baclofen , xanax and gabapentin 600 mg at bedtime and sometimes takes another one at 3-4 am.   The gabapentin seems to help but the second dose gives her a hangover until noon.    Ear sensation/lightheaded:   She gets outs of lightheadedness that often start with a stuffed up sensation in her right ear.   Mood/cognitive:    She notes depression and some anxiety.   She is apathetic.       She notes reduced focus and attention and sometime mild memory issues.   Her nose bled a lot with Flonase.   She already takes Zyrtec  Fatigue/sleep:   She has fatigue that is both physical and mental. The fatigue is present when she wakes up but worsens as the day goes on. Of  note, she has severe OSA (AHI = 33) diagnosed 11/20/2013.   She was placed on Auto-PAP .  She recently tried the Dreamwear mask and feels it is easier to wear than other masks.  She also has moderate RLS with some impact on sleep, helped by nighttime gabapentin 300 mg and baclofen and xanax at bedtime.      During the day she is very sleepy but then has trouble falling asleep at night.      Provigil made her jittery.   Phentermine helped the sleepiness but she had mild stomach upset.  Gait/strength/sensation:   Gait issues are mild and mostly orthopedic in nature .   Her legs fatigue easily.   She occasioanally trips over her feet and she broke her 5th toe once.    She denies any leg numbness.  Vision:   She had left optic neuritis in 2012. Left visual acuity improved but not to baseline.   Colors are desaturated on the left, esp when tired  Bladder:   Bladder function is fine.    MS History:    She was diagnosed with MS in 1999 after presenting with optic neuritis.   She had a spinal tap, MRI and angiogram.   Initially, the diagnosis was uncertain but she had changes in the MRI over time leading to a diagnosis of MS a few years later.    Initially, she was placed on Copaxone, then switched to Avonex due to skin reactions.   She then switched to Tysabri when she had some mild breakthrough disease. She tolerated Tysabri very well and her MS did well.  However, she was JCV antibody positive so she stopped in 2012.   She did not restart Avonex as she did not want to go back to an injection.    Her last exacerbation was optic neuritis in 2012 treated with steroids. She proved but did not get back to baseline.  This exacerbation occurred off any DMT.  She tried Gilenya in 2013. She had trouble tolerating Gilenya and discontinued shortly after starting it. She was going to start Tecfidera but was diagnosed with breast cancer in 2013 and opted not to start at that time. She received chemotherapy including  Taxotere, carboplatin, Herceptin in 2013 and 2014. An MRI of the brain in 2015 showed only mild MS progression compared to her previous one in 2011.      REVIEW OF SYSTEMS: Constitutional: No fevers, chills, sweats, or change in appetite.  Notes fatigue Eyes: No visual changes, double vision, eye pain Ear, nose and throat: No hearing loss, ear pain, nasal congestion, sore throat.  Recent URI Cardiovascular: No chest pain, palpitations Respiratory: No shortness of breath at rest or with exertion.   No wheezes.  Has OSA GastrointestinaI: No nausea, vomiting, diarrhea, abdominal pain, fecal incontinence Genitourinary: No dysuria, urinary retention or frequency.  She has 1 times nocturia most nights Musculoskeletal: No neck pain, back pain.  She has bilateral knee pain Integumentary: No rash, pruritus, skin lesions Neurological: as above Psychiatric: No depression at this time.  Some anxiety Endocrine: No palpitations, diaphoresis, change in appetite, change in weigh or increased thirst Hematologic/Lymphatic: No anemia, purpura, petechiae. Allergic/Immunologic: No itchy/runny eyes, nasal congestion, recent allergic reactions, rashes  ALLERGIES: Allergies  Allergen Reactions   Bee Venom Anaphylaxis    HOME MEDICATIONS:  Current Outpatient Medications:    ALPRAZolam (XANAX) 0.5 MG tablet, Take 1 tablet (0.5 mg total) by mouth 3 (three) times daily as needed. for anxiety, Disp: 270 tablet, Rfl: 1   amLODipine (NORVASC) 10 MG tablet, TAKE 1 TABLET DAILY, Disp: 90 tablet, Rfl: 3   atorvastatin (LIPITOR) 20 MG tablet, TAKE 1 TABLET DAILY, Disp: 90 tablet, Rfl: 3  baclofen (LIORESAL) 10 MG tablet, TAKE 1 TABLET THREE TIMES A DAY AS NEEDED FOR MUSCLE SPASMS, Disp: 270 tablet, Rfl: 3   buPROPion (WELLBUTRIN XL) 150 MG 24 hr tablet, TAKE 1 TABLET DAILY, Disp: 90 tablet, Rfl: 4   CALCIUM-MAGNESIUM PO, Take 1 tablet by mouth daily., Disp: , Rfl:    cetirizine (ZYRTEC) 10 MG tablet,  TAKE 1 TABLET(10 MG) BY MOUTH DAILY, Disp: 90 tablet, Rfl: 3   gabapentin (NEURONTIN) 300 MG capsule, Take 1-2 pills twice a day, Disp: 360 capsule, Rfl: 3   KLOR-CON M20 20 MEQ tablet, TAKE 1 TABLET DAILY, Disp: 90 tablet, Rfl: 3   letrozole (FEMARA) 2.5 MG tablet, Take 1 tablet (2.5 mg total) by mouth daily., Disp: 90 tablet, Rfl: 3   levothyroxine (SYNTHROID, LEVOTHROID) 88 MCG tablet, TAKE 1 TABLET DAILY, Disp: 90 tablet, Rfl: 4   losartan-hydrochlorothiazide (HYZAAR) 100-25 MG tablet, TAKE 1 TABLET EVERY MORNING BEFORE BREAKFAST, Disp: 90 tablet, Rfl: 3   Multiple Vitamins-Minerals (MULTIVITAMINS THER. W/MINERALS) TABS, Take 1 tablet by mouth daily., Disp: , Rfl:    omeprazole (PRILOSEC) 40 MG capsule, TAKE 1 CAPSULE DAILY, Disp: 90 capsule, Rfl: 3   Probiotic Product (PROBIOTIC DAILY PO), Take 1 capsule by mouth daily. , Disp: , Rfl:    vitamin E 400 UNIT capsule, Take 800 Units by mouth daily., Disp: , Rfl:   PAST MEDICAL HISTORY: Past Medical History:  Diagnosis Date   Anemia    h/o - ? bld. transfusion - more recent- ?2013   Anxiety    uses xanax mainly for sleep   Arthritis    knees    Breast cancer ([place]) 11/2011   lul/ER+PR+, x1 lymph node    Cancer (HCC)    squamous cell skin cancer x7   Depression    GERD (gastroesophageal reflux disease)    H/O bone density study 03/2011   H/O colonoscopy    H/O echocardiogram    last one 02/2013, due to chemotherapy   Heart burn    Hx of radiation therapy 04/17/12- 06/04/12   left chest wall, axilla, L supraclavicular fossa 4500 cGy, left mastectomy scar/chest wall 5940 cGy   Hypertension    Hypothyroidism    Multiple sclerosis (HCC)    Neuromuscular disorder ([place])    MS TX WITH CYMBALTA    Personal history of chemotherapy    Personal history of radiation therapy    Sleep apnea    MILD SLEEP APNEA, NO (Needed) MACHINE  6 YRS AGO HPT [facility]    Wears glasses     PAST SURGICAL HISTORY: Past  Surgical History:  Procedure Laterality Date   CESAREAN SECTION     X2    COLONOSCOPY     ESOPHAGOGASTRODUODENOSCOPY     FLEXIBLE SIGMOIDOSCOPY     I&D KNEE WITH POLY EXCHANGE  03/02/2012   Procedure: IRRIGATION AND DEBRIDEMENT KNEE WITH POLY EXCHANGE;  Surgeon: Alta Corning, MD;  Location: [place];  Service: Orthopedics;  Laterality: Left;  I&D of total knee with Possible Poly Exchange    JOINT REPLACEMENT  Feb 2013   left knee- multiple surgeries    KNEE ARTHROSCOPY     LT KNEE 04/2011   KNEE ARTHROSCOPY  82   rt   MASTECTOMY     MASTECTOMY W/ SENTINEL NODE BIOPSY  12/19/2011   Procedure: MASTECTOMY WITH SENTINEL LYMPH NODE BIOPSY;  Surgeon: Haywood Lasso, MD;  Location: [place];  Service: General;  Laterality: Left;  left breast and left  axilla    MOHS SURGERY     right face   NO PAST SURGERIES     BLADDER SLING  4-5 YRS AGO    PORT-A-CATH REMOVAL  03/06/2012   Procedure: REMOVAL PORT-A-CATH;  Surgeon: Odis Hollingshead, MD;  Location: [place];  Service: General;  Laterality: N/A;   PORT-A-CATH REMOVAL Right 08/22/2012   Procedure: REMOVAL PORT-A-CATH;  Surgeon: Edward Jolly, MD;  Location: [place];  Service: General;  Laterality: Right;   PORTACATH PLACEMENT  01/16/2012   Procedure: INSERTION PORT-A-CATH;  Surgeon: Haywood Lasso, MD;  Location: [place];  Service: General;  Laterality: Right;  Porta Cath Placement    PORTACATH PLACEMENT  05/01/2012   Procedure: INSERTION PORT-A-CATH;  Surgeon: Haywood Lasso, MD;  Location: [place];  Service: General;  Laterality: Right;  right internal jugular port-a-cath insertion   TEE WITHOUT CARDIOVERSION  03/06/2012   Procedure: TRANSESOPHAGEAL ECHOCARDIOGRAM (TEE);  Surgeon: Josue Hector, MD;  Location: [place];  Service: Cardiovascular;  Laterality: N/A;  Rm. [id number]   TONSILLECTOMY     TOTAL KNEE ARTHROPLASTY  08/15/2011   Procedure: TOTAL KNEE ARTHROPLASTY; lft Surgeon:  Alta Corning, MD;  Location: [place];  Service: Orthopedics;  Laterality: Left;  RIGHT KNEE CORTIZONE INJECTION   TOTAL KNEE ARTHROPLASTY Left 08/18/2012   Procedure:  Irrigation and debridement of LEFT total knee;  Removal of total knee parts; Implant of spacers;  Surgeon: Kerin Salen, MD;  Location: [place];  Service: Orthopedics;  Laterality: Left;   TOTAL KNEE ARTHROPLASTY Right 08/12/2013   TOTAL KNEE ARTHROPLASTY Right 08/12/2013   Procedure: RIGHT TOTAL KNEE ARTHROPLASTY;  Surgeon: Kerin Salen, MD;  Location: [place];  Service: Orthopedics;  Laterality: Right;   TOTAL KNEE REVISION Left 04/19/2013   Procedure: TOTAL KNEE REVISION AND REMOVAL CEMENT SPACER;  Surgeon: Kerin Salen, MD;  Location: [place];  Service: Orthopedics;  Laterality: Left;   TUMOR REMOVAL     FROM [facility] AGE 11    FAMILY HISTORY: Family History  Problem Relation Age of Onset   Breast cancer Paternal Aunt        dx in her 63s   Heart disease Maternal Grandfather    Lung cancer Maternal Uncle        smoker   Breast cancer Paternal Grandmother        dx in her 10s   Ovarian cancer Paternal Aunt        dx in her 63s   Arthritis Mother    Hypertension Mother    Heart disease Mother        CHF   Dementia Father    Diabetes type II Father     SOCIAL HISTORY:  Social History   Socioeconomic History   Marital status: Married    Spouse name: Not on file   Number of children: 2   Years of education: Not on file   Highest education level: Not on file  Occupational History    Employer: [employer]   Financial resource strain: Not on file   Food insecurity    Worry: Not on file    Inability: Not on file   Transportation needs    Medical: Not on file    Non-medical: Not on file  Tobacco Use   Smoking status: Never Smoker   Smokeless tobacco: Never Used  Substance and Sexual Activity   Alcohol use: No   Drug  use: No   Sexual activity: Yes  Lifestyle    Physical activity    Days per week: Not on file    Minutes per session: Not on file   Stress: Not on file  Relationships   Social connections    Talks on phone: Not on file    Gets together: Not on file    Attends religious service: Not on file    Active member of club or organization: Not on file    Attends meetings of clubs or organizations: Not on file    Relationship status: Not on file   Intimate partner violence    Fear of current or ex partner: Not on file    Emotionally abused: Not on file    Physically abused: Not on file    Forced sexual activity: Not on file  Other Topics Concern   Not on file  Social History Narrative   Not on file     PHYSICAL EXAM  Vitals:   02/20/19 1509  BP: (!) 98/58  Pulse: 81  Temp: 98.4 F (36.9 C)  SpO2: 98%  Weight: 229 lb 8 oz (104.1 kg)  Height: 5\' 6"  (1.676 m)    Body mass index is 37.04 kg/m.   General: The patient is well-developed and well-nourished and in no acute distress.   Reduced range of motion of the lower back.  She is tender in the lower lumbar paraspinal region and over the piriformis/SI region bilaterally   Neurologic Exam  Mental status: The patient is alert and oriented x 3 at the time of the examination. The patient has apparent normal recent and remote memory, with an apparently normal attention span and concentration ability.   Speech is normal.  Cranial nerves: Extraocular movements are full.  Facial strength and sensation is normal. The tongue is midline, and the patient has symmetric elevation of the soft palate. No obvious hearing deficits are noted.  Motor:  Muscle bulk is normal.   Muscle tone is normal.  Strength appears to be normal in the legs except right EHL (4/5)  Sensory: She has intact sensation to touch and vibration in the arms but reduced right L5 sensation to touch and temperature. or legs..  Coordination: Finger-nose-finger is performed well...  Gait and station: Station is  normal.   Gait is arthritic and tandem gait is wide.  She leans forward some as she walks.. The Romberg is negative.  Reflexes: Deep tendon reflexes are symmetric and normal bilaterally in arms.  DTRs are absent at the ankles.  Other:   Back pain is worse standing up straight than leaning mildly forward.     ASSESSMENT AND PLAN  Multiple sclerosis (HCC)  Spondylolisthesis at L4-L5 level  Pain of right leg  H/O malignant neoplasm of breast  Anxiety state   1.   She will continue off of her disease modifying therapy.  Around the time of the next visit we will consider repeating the MRI of the brain to make sure that there is no subclinical progression.  If present, we will restart a medication.   2.   She will be having surgery on her back soon.  She has severe degenerative changes at L4-L5  3.    she will return to see me in 6 months or sooner if she has new or worsening neurologic symptoms.   45 minute face-to-face evaluation with greater than one half the time counseling and coordinating care about her MS and pain/spine issues.  Alfredo Collymore  August Saucer, MD, PhD [id number], [id number] PM Certified in Neurology, Clinical Neurophysiology, Sleep Medicine, Pain Medicine and Neuroimaging  [facility] [address] [place], [place] [address] [phone]

## 2019-03-05 ENCOUNTER — Ambulatory Visit (HOSPITAL_COMMUNITY)
Admission: RE | Admit: 2019-03-05 | Discharge: 2019-03-05 | Disposition: A | Discharge status: Home / Self Care | Payer: Commercial Managed Care - PPO | Source: Ambulatory Visit | Attending: Neurological Surgery | Admitting: Neurological Surgery

## 2019-03-05 ENCOUNTER — Other Ambulatory Visit: Payer: Self-pay

## 2019-03-05 DIAGNOSIS — M48061 Spinal stenosis, lumbar region without neurogenic claudication: Secondary | ICD-10-CM | POA: Diagnosis not present

## 2019-03-06 ENCOUNTER — Encounter (HOSPITAL_COMMUNITY)
Admission: RE | Admit: 2019-03-06 | Discharge: 2019-03-06 | Disposition: A | Discharge status: Home / Self Care | Payer: Commercial Managed Care - PPO | Source: Ambulatory Visit | Attending: Neurological Surgery | Admitting: Neurological Surgery

## 2019-03-06 ENCOUNTER — Encounter (HOSPITAL_COMMUNITY): Payer: Self-pay

## 2019-03-06 ENCOUNTER — Other Ambulatory Visit: Payer: Self-pay

## 2019-03-06 DIAGNOSIS — Z01818 Encounter for other preprocedural examination: Secondary | ICD-10-CM | POA: Insufficient documentation

## 2019-03-06 DIAGNOSIS — I10 Essential (primary) hypertension: Secondary | ICD-10-CM | POA: Diagnosis not present

## 2019-03-06 LAB — SURGICAL PCR SCREEN
MRSA, PCR: NEGATIVE
Staphylococcus aureus: POSITIVE — AB

## 2019-03-06 LAB — CBC
HCT: 34 % — ABNORMAL LOW (ref 36.0–46.0)
Hemoglobin: 11.1 g/dL — ABNORMAL LOW (ref 12.0–15.0)
MCH: 24.4 pg — ABNORMAL LOW (ref 26.0–34.0)
MCHC: 32.6 g/dL (ref 30.0–36.0)
MCV: 74.9 fL — ABNORMAL LOW (ref 80.0–100.0)
Platelets: 263 10*3/uL (ref 150–400)
RBC: 4.54 MIL/uL (ref 3.87–5.11)
RDW: 15.4 % (ref 11.5–15.5)
WBC: 5.7 10*3/uL (ref 4.0–10.5)
nRBC: 0 % (ref 0.0–0.2)

## 2019-03-06 LAB — BASIC METABOLIC PANEL
Anion gap: 12 (ref 5–15)
BUN: 20 mg/dL (ref 6–20)
CO2: 22 mmol/L (ref 22–32)
Calcium: 9.4 mg/dL (ref 8.9–10.3)
Chloride: 102 mmol/L (ref 98–111)
Creatinine, Ser: 1.21 mg/dL — ABNORMAL HIGH (ref 0.44–1.00)
GFR calc Af Amer: 56 mL/min — ABNORMAL LOW (ref >60)
GFR calc non Af Amer: 49 mL/min — ABNORMAL LOW (ref >60)
Glucose, Bld: 114 mg/dL — ABNORMAL HIGH (ref 70–99)
Potassium: 3.5 mmol/L (ref 3.5–5.1)
Sodium: 136 mmol/L (ref 135–145)

## 2019-03-06 LAB — TYPE AND SCREEN
ABO/RH(D): O POS
Antibody Screen: NEGATIVE

## 2019-03-06 MED ORDER — CHLORHEXIDINE GLUCONATE CLOTH 2 % EX PADS: 6.0000 | MEDICATED_PAD | Freq: Once | CUTANEOUS | Status: DC

## 2019-03-06 NOTE — Progress Notes (Addendum)
PCP -  Cardiologist - na      EKG - 03/06/19 Stress Test - na ECHO - 2014 Cardiac Cath - na  Sleep Study - yes CPAP - occ  Aspirin Instructions:stop     Anesthesia review: na Patient denies shortness of breath, fever, cough and chest pain at PAT appointment   Patient verbalized understanding of instructions that were given to them at the PAT appointment. Patient was also instructed that they will need to review over the PAT instructions again at home before surgery.

## 2019-03-06 NOTE — Pre-Procedure Instructions (Signed)
Julie Ross  03/06/2019      [facility] [id number] [address], [address] AT [facility] [place] [address] Phone: [phone] Fax: [phone]  [facility] - [address], [place] [address] Phone: [phone] Fax: [phone]  [facility] [place], [place] [address] [place] [address] Phone: [phone] Fax: [phone]    Your procedure is scheduled on 03/12/19.  Report to [facility] Admitting at 7 A.M.  Call this number if you have problems the morning of surgery:  [phone]   Remember:  Do not eat or drink after midnight.      Take these medicines the morning of surgery with A SIP OF WATER ---XANAX,NORVASC,BACLOFEN,WELLBUTRIN,HYROCODONE,SYNTHROID,PRILOSEC    Do not wear jewelry, make-up or nail polish.  Do not wear lotions, powders, or perfumes, or deodorant.  Do not shave 48 hours prior to surgery.  Men may shave face and neck.  Do not bring valuables to the hospital.  [facility] is not responsible for any belongings or valuables.  Contacts, dentures or bridgework may not be worn into surgery.  Leave your suitcase in the car.  After surgery it may be brought to your room.  For patients admitted to the hospital, discharge time will be determined by your treatment team.  Patients discharged the day of surgery will not be allowed to drive home.    Special instructions:  Do not take any aspirin,anti-inflammatories,vitamins,or herbal supplements 5-7 days prior to surgery.[place] - Preparing for Surgery  Before surgery, you can play an important role.  Because skin is not sterile, your skin needs to be as free of germs as possible.  You can reduce the number of germs on you skin by washing with CHG (chlorahexidine gluconate) soap before surgery.  CHG is an antiseptic  cleaner which kills germs and bonds with the skin to continue killing germs even after washing.  Oral Hygiene is also important in reducing the risk of infection.  Remember to brush your teeth with your regular toothpaste the morning of surgery.  Please DO NOT use if you have an allergy to CHG or antibacterial soaps.  If your skin becomes reddened/irritated stop using the CHG and inform your nurse when you arrive at Short Stay.  Do not shave (including legs and underarms) for at least 48 hours prior to the first CHG shower.  You may shave your face.  Please follow these instructions carefully:   1.  Shower with CHG Soap the night before surgery and the morning of Surgery.  2.  If you choose to wash your hair, wash your hair first as usual with your normal shampoo.  3.  After you shampoo, rinse your hair and body thoroughly to remove the shampoo. 4.  Use CHG as you would any other liquid soap.  You can apply chg directly to the skin and wash gently with a      scrungie or washcloth.           5.  Apply the CHG Soap to your body ONLY FROM THE NECK DOWN.   Do not use on open wounds or open sores. Avoid contact with your eyes, ears, mouth and genitals (private parts).  Wash genitals (private parts) with your normal soap.  6.  Wash  thoroughly, paying special attention to the area where your surgery will be performed.  7.  Thoroughly rinse your body with warm water from the neck down.  8.  DO NOT shower/wash with your normal soap after using and rinsing off the CHG Soap.  9.  Pat yourself dry with a clean towel.            10.  Wear clean pajamas.            11.  Place clean sheets on your bed the night of your first shower and do not sleep with pets.  Day of Surgery  Do not apply any lotions/deoderants the morning of surgery.   Please wear clean clothes to the hospital/surgery center. Remember to brush your teeth with toothpaste.    Please read over the following fact sheets that you were  given. MRSA Information

## 2019-03-08 ENCOUNTER — Other Ambulatory Visit (HOSPITAL_COMMUNITY)
Admission: RE | Admit: 2019-03-08 | Discharge: 2019-03-08 | Disposition: A | Discharge status: Home / Self Care | Payer: Commercial Managed Care - PPO | Source: Ambulatory Visit | Attending: Neurological Surgery | Admitting: Neurological Surgery

## 2019-03-08 DIAGNOSIS — Z20828 Contact with and (suspected) exposure to other viral communicable diseases: Secondary | ICD-10-CM | POA: Insufficient documentation

## 2019-03-08 DIAGNOSIS — Z01812 Encounter for preprocedural laboratory examination: Secondary | ICD-10-CM | POA: Diagnosis not present

## 2019-03-09 LAB — NOVEL CORONAVIRUS, NAA (HOSP ORDER, SEND-OUT TO REF LAB; TAT 18-24 HRS): SARS-CoV-2, NAA: NOT DETECTED

## 2019-03-12 ENCOUNTER — Inpatient Hospital Stay (HOSPITAL_COMMUNITY): Payer: Commercial Managed Care - PPO | Admitting: Certified Registered Nurse Anesthetist

## 2019-03-12 ENCOUNTER — Inpatient Hospital Stay (HOSPITAL_COMMUNITY): Payer: Commercial Managed Care - PPO

## 2019-03-12 ENCOUNTER — Encounter (HOSPITAL_COMMUNITY)
Admission: RE | Disposition: A | Discharge status: Home / Self Care | Payer: Self-pay | Source: Home / Self Care | Attending: Neurological Surgery

## 2019-03-12 ENCOUNTER — Encounter (HOSPITAL_COMMUNITY): Payer: Self-pay | Admitting: General Practice

## 2019-03-12 ENCOUNTER — Other Ambulatory Visit: Payer: Self-pay

## 2019-03-12 ENCOUNTER — Inpatient Hospital Stay (HOSPITAL_COMMUNITY)
Admission: RE | Admit: 2019-03-12 | Discharge: 2019-03-17 | DRG: 459 | Disposition: A | Discharge status: Home / Self Care | Payer: Commercial Managed Care - PPO | Attending: Neurological Surgery | Admitting: Neurological Surgery

## 2019-03-12 DIAGNOSIS — N183 Chronic kidney disease, stage 3 (moderate): Secondary | ICD-10-CM | POA: Diagnosis present

## 2019-03-12 DIAGNOSIS — K219 Gastro-esophageal reflux disease without esophagitis: Secondary | ICD-10-CM | POA: Diagnosis present

## 2019-03-12 DIAGNOSIS — Z853 Personal history of malignant neoplasm of breast: Secondary | ICD-10-CM | POA: Diagnosis not present

## 2019-03-12 DIAGNOSIS — M5416 Radiculopathy, lumbar region: Secondary | ICD-10-CM | POA: Diagnosis present

## 2019-03-12 DIAGNOSIS — R739 Hyperglycemia, unspecified: Secondary | ICD-10-CM | POA: Diagnosis not present

## 2019-03-12 DIAGNOSIS — E785 Hyperlipidemia, unspecified: Secondary | ICD-10-CM | POA: Diagnosis present

## 2019-03-12 DIAGNOSIS — D62 Acute posthemorrhagic anemia: Secondary | ICD-10-CM | POA: Diagnosis not present

## 2019-03-12 DIAGNOSIS — M48061 Spinal stenosis, lumbar region without neurogenic claudication: Principal | ICD-10-CM | POA: Diagnosis present

## 2019-03-12 DIAGNOSIS — Z419 Encounter for procedure for purposes other than remedying health state, unspecified: Secondary | ICD-10-CM

## 2019-03-12 DIAGNOSIS — G35 Multiple sclerosis: Secondary | ICD-10-CM | POA: Diagnosis present

## 2019-03-12 DIAGNOSIS — Z801 Family history of malignant neoplasm of trachea, bronchus and lung: Secondary | ICD-10-CM

## 2019-03-12 DIAGNOSIS — F419 Anxiety disorder, unspecified: Secondary | ICD-10-CM | POA: Diagnosis present

## 2019-03-12 DIAGNOSIS — M4726 Other spondylosis with radiculopathy, lumbar region: Secondary | ICD-10-CM | POA: Diagnosis present

## 2019-03-12 DIAGNOSIS — J9601 Acute respiratory failure with hypoxia: Secondary | ICD-10-CM | POA: Diagnosis not present

## 2019-03-12 DIAGNOSIS — Z923 Personal history of irradiation: Secondary | ICD-10-CM

## 2019-03-12 DIAGNOSIS — Z9012 Acquired absence of left breast and nipple: Secondary | ICD-10-CM

## 2019-03-12 DIAGNOSIS — E876 Hypokalemia: Secondary | ICD-10-CM | POA: Diagnosis not present

## 2019-03-12 DIAGNOSIS — Z96653 Presence of artificial knee joint, bilateral: Secondary | ICD-10-CM | POA: Diagnosis present

## 2019-03-12 DIAGNOSIS — F418 Other specified anxiety disorders: Secondary | ICD-10-CM | POA: Diagnosis present

## 2019-03-12 DIAGNOSIS — F329 Major depressive disorder, single episode, unspecified: Secondary | ICD-10-CM | POA: Diagnosis present

## 2019-03-12 DIAGNOSIS — D509 Iron deficiency anemia, unspecified: Secondary | ICD-10-CM | POA: Diagnosis present

## 2019-03-12 DIAGNOSIS — G4733 Obstructive sleep apnea (adult) (pediatric): Secondary | ICD-10-CM | POA: Diagnosis present

## 2019-03-12 DIAGNOSIS — I1310 Hypertensive heart and chronic kidney disease without heart failure, with stage 1 through stage 4 chronic kidney disease, or unspecified chronic kidney disease: Secondary | ICD-10-CM | POA: Diagnosis present

## 2019-03-12 DIAGNOSIS — M7138 Other bursal cyst, other site: Secondary | ICD-10-CM | POA: Diagnosis present

## 2019-03-12 DIAGNOSIS — Z9221 Personal history of antineoplastic chemotherapy: Secondary | ICD-10-CM

## 2019-03-12 DIAGNOSIS — E039 Hypothyroidism, unspecified: Secondary | ICD-10-CM | POA: Diagnosis present

## 2019-03-12 DIAGNOSIS — Z20828 Contact with and (suspected) exposure to other viral communicable diseases: Secondary | ICD-10-CM | POA: Diagnosis present

## 2019-03-12 DIAGNOSIS — Z803 Family history of malignant neoplasm of breast: Secondary | ICD-10-CM

## 2019-03-12 DIAGNOSIS — N179 Acute kidney failure, unspecified: Secondary | ICD-10-CM | POA: Diagnosis not present

## 2019-03-12 DIAGNOSIS — J9621 Acute and chronic respiratory failure with hypoxia: Secondary | ICD-10-CM | POA: Diagnosis not present

## 2019-03-12 DIAGNOSIS — R0602 Shortness of breath: Secondary | ICD-10-CM

## 2019-03-12 DIAGNOSIS — R6510 Systemic inflammatory response syndrome (SIRS) of non-infectious origin without acute organ dysfunction: Secondary | ICD-10-CM | POA: Diagnosis not present

## 2019-03-12 DIAGNOSIS — Z8249 Family history of ischemic heart disease and other diseases of the circulatory system: Secondary | ICD-10-CM

## 2019-03-12 DIAGNOSIS — Z8261 Family history of arthritis: Secondary | ICD-10-CM

## 2019-03-12 DIAGNOSIS — Z8041 Family history of malignant neoplasm of ovary: Secondary | ICD-10-CM

## 2019-03-12 DIAGNOSIS — Z833 Family history of diabetes mellitus: Secondary | ICD-10-CM

## 2019-03-12 HISTORY — PX: TRANSFORAMINAL LUMBAR INTERBODY FUSION (TLIF) WITH PEDICLE SCREW FIXATION 1 LEVEL: SHX6141

## 2019-03-12 HISTORY — PX: APPLICATION OF ROBOTIC ASSISTANCE FOR SPINAL PROCEDURE: SHX6753

## 2019-03-12 SURGERY — TRANSFORAMINAL LUMBAR INTERBODY FUSION (TLIF) WITH PEDICLE SCREW FIXATION 1 LEVEL
Anesthesia: General | Site: Spine Lumbar

## 2019-03-12 MED ORDER — PROPOFOL 10 MG/ML IV BOLUS
INTRAVENOUS | Status: AC
  Filled 2019-03-12: qty 20

## 2019-03-12 MED ORDER — SUGAMMADEX SODIUM 200 MG/2ML IV SOLN
INTRAVENOUS | Status: DC | PRN
  Administered 2019-03-12: 250 mg via INTRAVENOUS

## 2019-03-12 MED ORDER — CEFAZOLIN SODIUM-DEXTROSE 2-4 GM/100ML-% IV SOLN
INTRAVENOUS | Status: AC
  Filled 2019-03-12: qty 100

## 2019-03-12 MED ORDER — LIDOCAINE 2% (20 MG/ML) 5 ML SYRINGE
INTRAMUSCULAR | Status: DC | PRN
  Administered 2019-03-12: 60 mg via INTRAVENOUS

## 2019-03-12 MED ORDER — LOSARTAN POTASSIUM-HCTZ 100-25 MG PO TABS: 1.0000 | ORAL_TABLET | Freq: Every day | ORAL | Status: DC

## 2019-03-12 MED ORDER — ACETAMINOPHEN 10 MG/ML IV SOLN
INTRAVENOUS | Status: AC
  Filled 2019-03-12: qty 100

## 2019-03-12 MED ORDER — ALPRAZOLAM 0.5 MG PO TABS
0.5000 mg | ORAL_TABLET | Freq: Three times a day (TID) | ORAL | Status: DC | PRN
  Administered 2019-03-13: 0.5 mg via ORAL
  Filled 2019-03-12: qty 1

## 2019-03-12 MED ORDER — OXYCODONE HCL 5 MG PO TABS
5.0000 mg | ORAL_TABLET | ORAL | Status: DC | PRN
  Administered 2019-03-12 – 2019-03-14 (×3): 5 mg via ORAL
  Filled 2019-03-12 (×3): qty 1

## 2019-03-12 MED ORDER — ROCURONIUM BROMIDE 10 MG/ML (PF) SYRINGE
PREFILLED_SYRINGE | INTRAVENOUS | Status: AC
  Filled 2019-03-12: qty 10

## 2019-03-12 MED ORDER — CEFAZOLIN SODIUM-DEXTROSE 2-4 GM/100ML-% IV SOLN: 2.0000 g | Freq: Three times a day (TID) | INTRAVENOUS | Status: DC

## 2019-03-12 MED ORDER — BUPROPION HCL ER (XL) 150 MG PO TB24
150.0000 mg | ORAL_TABLET | Freq: Every day | ORAL | Status: DC
  Administered 2019-03-13 – 2019-03-17 (×4): 150 mg via ORAL
  Filled 2019-03-12 (×5): qty 1

## 2019-03-12 MED ORDER — SODIUM CHLORIDE 0.9% FLUSH: 3.0000 mL | INTRAVENOUS | Status: DC | PRN

## 2019-03-12 MED ORDER — PROPOFOL 10 MG/ML IV BOLUS
INTRAVENOUS | Status: DC | PRN
  Administered 2019-03-12: 110 mg via INTRAVENOUS
  Administered 2019-03-12: 20 mg via INTRAVENOUS

## 2019-03-12 MED ORDER — LIDOCAINE-EPINEPHRINE 1 %-1:100000 IJ SOLN
INTRAMUSCULAR | Status: DC | PRN
  Administered 2019-03-12: 5 mL

## 2019-03-12 MED ORDER — LIDOCAINE 2% (20 MG/ML) 5 ML SYRINGE
INTRAMUSCULAR | Status: AC
  Filled 2019-03-12: qty 5

## 2019-03-12 MED ORDER — AMLODIPINE BESYLATE 10 MG PO TABS
10.0000 mg | ORAL_TABLET | Freq: Every day | ORAL | Status: DC
  Administered 2019-03-14 – 2019-03-17 (×4): 10 mg via ORAL
  Filled 2019-03-12 (×3): qty 1
  Filled 2019-03-12: qty 2

## 2019-03-12 MED ORDER — PHENYLEPHRINE 40 MCG/ML (10ML) SYRINGE FOR IV PUSH (FOR BLOOD PRESSURE SUPPORT)
PREFILLED_SYRINGE | INTRAVENOUS | Status: AC
  Filled 2019-03-12: qty 10

## 2019-03-12 MED ORDER — LEVOTHYROXINE SODIUM 88 MCG PO TABS
88.0000 ug | ORAL_TABLET | Freq: Every day | ORAL | Status: DC
  Administered 2019-03-13 – 2019-03-17 (×5): 88 ug via ORAL
  Filled 2019-03-12 (×5): qty 1

## 2019-03-12 MED ORDER — THROMBIN 5000 UNITS EX SOLR
OROMUCOSAL | Status: DC | PRN
  Administered 2019-03-12: 09:00:00 5 mL via TOPICAL

## 2019-03-12 MED ORDER — ATORVASTATIN CALCIUM 10 MG PO TABS
20.0000 mg | ORAL_TABLET | Freq: Every day | ORAL | Status: DC
  Administered 2019-03-12 – 2019-03-17 (×6): 20 mg via ORAL
  Filled 2019-03-12 (×6): qty 2

## 2019-03-12 MED ORDER — ONDANSETRON HCL 4 MG/2ML IJ SOLN: 4.0000 mg | Freq: Four times a day (QID) | INTRAMUSCULAR | Status: DC | PRN

## 2019-03-12 MED ORDER — DEXAMETHASONE SODIUM PHOSPHATE 10 MG/ML IJ SOLN
INTRAMUSCULAR | Status: DC | PRN
  Administered 2019-03-12: 10 mg via INTRAVENOUS

## 2019-03-12 MED ORDER — FENTANYL CITRATE (PF) 250 MCG/5ML IJ SOLN
INTRAMUSCULAR | Status: DC | PRN
  Administered 2019-03-12: 50 ug via INTRAVENOUS
  Administered 2019-03-12: 100 ug via INTRAVENOUS
  Administered 2019-03-12: 50 ug via INTRAVENOUS

## 2019-03-12 MED ORDER — LETROZOLE 2.5 MG PO TABS
2.5000 mg | ORAL_TABLET | Freq: Every day | ORAL | Status: DC
  Administered 2019-03-12 – 2019-03-17 (×6): 2.5 mg via ORAL
  Filled 2019-03-12 (×7): qty 1

## 2019-03-12 MED ORDER — ROCURONIUM BROMIDE 10 MG/ML (PF) SYRINGE
PREFILLED_SYRINGE | INTRAVENOUS | Status: DC | PRN
  Administered 2019-03-12: 70 mg via INTRAVENOUS
  Administered 2019-03-12 (×2): 20 mg via INTRAVENOUS
  Administered 2019-03-12: 30 mg via INTRAVENOUS

## 2019-03-12 MED ORDER — MENTHOL 3 MG MT LOZG
1.0000 | LOZENGE | OROMUCOSAL | Status: DC | PRN
  Administered 2019-03-17: 3 mg via ORAL
  Filled 2019-03-12 (×2): qty 9

## 2019-03-12 MED ORDER — ONDANSETRON HCL 4 MG PO TABS
4.0000 mg | ORAL_TABLET | Freq: Four times a day (QID) | ORAL | Status: DC | PRN
  Administered 2019-03-17: 4 mg via ORAL
  Filled 2019-03-12: qty 1

## 2019-03-12 MED ORDER — OXYCODONE HCL 5 MG PO TABS: 5.0000 mg | ORAL_TABLET | Freq: Once | ORAL | Status: DC | PRN

## 2019-03-12 MED ORDER — ACETAMINOPHEN 160 MG/5ML PO SOLN: 1000.0000 mg | Freq: Once | ORAL | Status: DC | PRN

## 2019-03-12 MED ORDER — MIDAZOLAM HCL 2 MG/2ML IJ SOLN
INTRAMUSCULAR | Status: DC | PRN
  Administered 2019-03-12 (×2): 1 mg via INTRAVENOUS

## 2019-03-12 MED ORDER — CEFAZOLIN SODIUM-DEXTROSE 2-4 GM/100ML-% IV SOLN
2.0000 g | INTRAVENOUS | Status: AC
  Administered 2019-03-12 (×2): 2 g via INTRAVENOUS

## 2019-03-12 MED ORDER — SODIUM CHLORIDE 0.9 % IV SOLN: 250.0000 mL | INTRAVENOUS | Status: DC

## 2019-03-12 MED ORDER — OXYCODONE HCL 5 MG PO TABS
10.0000 mg | ORAL_TABLET | ORAL | Status: DC | PRN
  Administered 2019-03-13 – 2019-03-14 (×7): 10 mg via ORAL
  Filled 2019-03-12 (×7): qty 2

## 2019-03-12 MED ORDER — ACETAMINOPHEN 10 MG/ML IV SOLN
INTRAVENOUS | Status: DC | PRN
  Administered 2019-03-12: 1000 mg via INTRAVENOUS

## 2019-03-12 MED ORDER — BUPIVACAINE HCL (PF) 0.5 % IJ SOLN
INTRAMUSCULAR | Status: DC | PRN
  Administered 2019-03-12: 5 mL

## 2019-03-12 MED ORDER — SODIUM CHLORIDE 0.9 % IV SOLN
INTRAVENOUS | Status: DC | PRN
  Administered 2019-03-12: 08:00:00 500 mL

## 2019-03-12 MED ORDER — CEFAZOLIN SODIUM-DEXTROSE 2-4 GM/100ML-% IV SOLN
2.0000 g | Freq: Three times a day (TID) | INTRAVENOUS | Status: AC
  Administered 2019-03-12 – 2019-03-13 (×2): 2 g via INTRAVENOUS
  Filled 2019-03-12 (×2): qty 100

## 2019-03-12 MED ORDER — 0.9 % SODIUM CHLORIDE (POUR BTL) OPTIME
TOPICAL | Status: DC | PRN
  Administered 2019-03-12: 08:00:00 1000 mL

## 2019-03-12 MED ORDER — ACETAMINOPHEN 500 MG PO TABS: 1000.0000 mg | ORAL_TABLET | Freq: Once | ORAL | Status: DC | PRN

## 2019-03-12 MED ORDER — DEXAMETHASONE SODIUM PHOSPHATE 10 MG/ML IJ SOLN
INTRAMUSCULAR | Status: AC
  Filled 2019-03-12: qty 1

## 2019-03-12 MED ORDER — PHENYLEPHRINE HCL (PRESSORS) 10 MG/ML IV SOLN: INTRAVENOUS | Status: DC | PRN

## 2019-03-12 MED ORDER — ONDANSETRON HCL 4 MG/2ML IJ SOLN
INTRAMUSCULAR | Status: DC | PRN
  Administered 2019-03-12: 4 mg via INTRAVENOUS

## 2019-03-12 MED ORDER — ACETAMINOPHEN 10 MG/ML IV SOLN
1000.0000 mg | Freq: Once | INTRAVENOUS | Status: DC | PRN
  Administered 2019-03-12: 1000 mg via INTRAVENOUS

## 2019-03-12 MED ORDER — EPHEDRINE SULFATE 50 MG/ML IJ SOLN
INTRAMUSCULAR | Status: DC | PRN
  Administered 2019-03-12: 5 mg via INTRAVENOUS
  Administered 2019-03-12: 10 mg via INTRAVENOUS

## 2019-03-12 MED ORDER — BACLOFEN 10 MG PO TABS
10.0000 mg | ORAL_TABLET | Freq: Three times a day (TID) | ORAL | Status: DC | PRN
  Administered 2019-03-13 – 2019-03-16 (×6): 10 mg via ORAL
  Filled 2019-03-12 (×6): qty 1

## 2019-03-12 MED ORDER — LIDOCAINE-EPINEPHRINE 1 %-1:100000 IJ SOLN
INTRAMUSCULAR | Status: AC
  Filled 2019-03-12: qty 1

## 2019-03-12 MED ORDER — PHENOL 1.4 % MT LIQD: 1.0000 | OROMUCOSAL | Status: DC | PRN

## 2019-03-12 MED ORDER — BUPIVACAINE HCL (PF) 0.5 % IJ SOLN
INTRAMUSCULAR | Status: AC
  Filled 2019-03-12: qty 30

## 2019-03-12 MED ORDER — DOCUSATE SODIUM 100 MG PO CAPS
100.0000 mg | ORAL_CAPSULE | Freq: Two times a day (BID) | ORAL | Status: DC
  Administered 2019-03-12 – 2019-03-17 (×9): 100 mg via ORAL
  Filled 2019-03-12 (×10): qty 1

## 2019-03-12 MED ORDER — SODIUM CHLORIDE 0.9% FLUSH
3.0000 mL | Freq: Two times a day (BID) | INTRAVENOUS | Status: DC
  Administered 2019-03-15 – 2019-03-17 (×4): 3 mL via INTRAVENOUS

## 2019-03-12 MED ORDER — FENTANYL CITRATE (PF) 100 MCG/2ML IJ SOLN
25.0000 ug | INTRAMUSCULAR | Status: DC | PRN
  Administered 2019-03-12 (×2): 50 ug via INTRAVENOUS

## 2019-03-12 MED ORDER — HYDROMORPHONE HCL 1 MG/ML IJ SOLN: 0.5000 mg | INTRAMUSCULAR | Status: DC | PRN

## 2019-03-12 MED ORDER — LOSARTAN POTASSIUM 50 MG PO TABS
100.0000 mg | ORAL_TABLET | Freq: Every day | ORAL | Status: DC
  Administered 2019-03-14 – 2019-03-17 (×4): 100 mg via ORAL
  Filled 2019-03-12 (×4): qty 2

## 2019-03-12 MED ORDER — PANTOPRAZOLE SODIUM 40 MG PO TBEC
40.0000 mg | DELAYED_RELEASE_TABLET | Freq: Every day | ORAL | Status: DC
  Administered 2019-03-13 – 2019-03-17 (×4): 40 mg via ORAL
  Filled 2019-03-12 (×5): qty 1

## 2019-03-12 MED ORDER — POLYETHYLENE GLYCOL 3350 17 G PO PACK: 17.0000 g | PACK | Freq: Every day | ORAL | Status: DC | PRN

## 2019-03-12 MED ORDER — ACETAMINOPHEN 650 MG RE SUPP: 650.0000 mg | RECTAL | Status: DC | PRN

## 2019-03-12 MED ORDER — OXYCODONE HCL 5 MG PO TABS
ORAL_TABLET | ORAL | Status: AC
  Filled 2019-03-12: qty 1

## 2019-03-12 MED ORDER — THROMBIN 5000 UNITS EX SOLR
CUTANEOUS | Status: AC
  Filled 2019-03-12: qty 5000

## 2019-03-12 MED ORDER — SODIUM CHLORIDE 0.9 % IV SOLN
INTRAVENOUS | Status: DC | PRN
  Administered 2019-03-12: 25 ug/min via INTRAVENOUS

## 2019-03-12 MED ORDER — HYDROCHLOROTHIAZIDE 25 MG PO TABS
25.0000 mg | ORAL_TABLET | Freq: Every day | ORAL | Status: DC
  Administered 2019-03-14 – 2019-03-17 (×4): 25 mg via ORAL
  Filled 2019-03-12 (×5): qty 1

## 2019-03-12 MED ORDER — OXYCODONE HCL 5 MG/5ML PO SOLN: 5.0000 mg | Freq: Once | ORAL | Status: DC | PRN

## 2019-03-12 MED ORDER — ACETAMINOPHEN 325 MG PO TABS
650.0000 mg | ORAL_TABLET | ORAL | Status: DC | PRN
  Administered 2019-03-12 – 2019-03-13 (×6): 650 mg via ORAL
  Filled 2019-03-12 (×7): qty 2

## 2019-03-12 MED ORDER — FENTANYL CITRATE (PF) 250 MCG/5ML IJ SOLN
INTRAMUSCULAR | Status: AC
  Filled 2019-03-12: qty 5

## 2019-03-12 MED ORDER — GABAPENTIN 600 MG PO TABS
600.0000 mg | ORAL_TABLET | Freq: Two times a day (BID) | ORAL | Status: DC
  Administered 2019-03-13 – 2019-03-17 (×8): 600 mg via ORAL
  Filled 2019-03-12 (×10): qty 1

## 2019-03-12 MED ORDER — DEXMEDETOMIDINE HCL IN NACL 400 MCG/100ML IV SOLN
INTRAVENOUS | Status: DC | PRN
  Administered 2019-03-12: .4 ug/kg/h via INTRAVENOUS

## 2019-03-12 MED ORDER — POTASSIUM CHLORIDE CRYS ER 20 MEQ PO TBCR
20.0000 meq | EXTENDED_RELEASE_TABLET | Freq: Every day | ORAL | Status: DC
  Administered 2019-03-12 – 2019-03-17 (×5): 20 meq via ORAL
  Filled 2019-03-12 (×6): qty 1

## 2019-03-12 MED ORDER — GLYCOPYRROLATE 0.2 MG/ML IJ SOLN
INTRAMUSCULAR | Status: DC | PRN
  Administered 2019-03-12 (×2): 0.1 mg via INTRAVENOUS
  Administered 2019-03-12: 0.2 mg via INTRAVENOUS

## 2019-03-12 MED ORDER — MIDAZOLAM HCL 2 MG/2ML IJ SOLN
INTRAMUSCULAR | Status: AC
  Filled 2019-03-12: qty 2

## 2019-03-12 MED ORDER — ONDANSETRON HCL 4 MG/2ML IJ SOLN
INTRAMUSCULAR | Status: AC
  Filled 2019-03-12: qty 2

## 2019-03-12 MED ORDER — FENTANYL CITRATE (PF) 100 MCG/2ML IJ SOLN
INTRAMUSCULAR | Status: AC
  Filled 2019-03-12: qty 2

## 2019-03-12 MED ORDER — LACTATED RINGERS IV SOLN
INTRAVENOUS | Status: DC | PRN
  Administered 2019-03-12 (×2): via INTRAVENOUS

## 2019-03-12 SURGICAL SUPPLY — 90 items
ADH SKN CLS APL DERMABOND .7 (GAUZE/BANDAGES/DRESSINGS) ×2
APL SKNCLS STERI-STRIP NONHPOA (GAUZE/BANDAGES/DRESSINGS)
BAG DECANTER FOR FLEXI CONT (MISCELLANEOUS) ×3 IMPLANT
BAND INSRT 18 STRL LF DISP RB (MISCELLANEOUS)
BAND RUBBER #18 3X1/16 STRL (MISCELLANEOUS) ×4 IMPLANT
BASKET BONE COLLECTION (BASKET) ×3 IMPLANT
BENZOIN TINCTURE PRP APPL 2/3 (GAUZE/BANDAGES/DRESSINGS) IMPLANT
BIT DRILL LONG 3.0X30 (BIT) ×1 IMPLANT
BIT DRILL LONG 3X80 (BIT) IMPLANT
BIT DRILL LONG 4X80 (BIT) IMPLANT
BIT DRILL SHORT 3.0X30 (BIT) IMPLANT
BIT DRILL SHORT 3X80 (BIT) IMPLANT
BLADE CLIPPER SURG (BLADE) IMPLANT
BLADE SURG 11 STRL SS (BLADE) ×2 IMPLANT
BUR MATCHSTICK NEURO 3.0 LAGG (BURR) ×3 IMPLANT
BUR PRECISION FLUTE 5.0 (BURR) ×3 IMPLANT
CANISTER SUCT 3000ML PPV (MISCELLANEOUS) ×3 IMPLANT
CATH FOLEY LATEX FREE 16FR (CATHETERS) ×3
CATH FOLEY LF 16FR (CATHETERS) IMPLANT
CONT SPEC 4OZ CLIKSEAL STRL BL (MISCELLANEOUS) ×3 IMPLANT
COVER BACK TABLE 60X90IN (DRAPES) ×3 IMPLANT
COVER WAND RF STERILE (DRAPES) ×3 IMPLANT
DECANTER SPIKE VIAL GLASS SM (MISCELLANEOUS) ×3 IMPLANT
DERMABOND ADVANCED (GAUZE/BANDAGES/DRESSINGS) ×1
DERMABOND ADVANCED .7 DNX12 (GAUZE/BANDAGES/DRESSINGS) ×2 IMPLANT
DEVICE INTERBODY ELEVATE 23X7 (Cage) ×1 IMPLANT
DRAPE C-ARM 42X72 X-RAY (DRAPES) ×1 IMPLANT
DRAPE C-ARMOR (DRAPES) ×1 IMPLANT
DRAPE LAPAROTOMY 100X72X124 (DRAPES) ×3 IMPLANT
DRAPE MICROSCOPE LEICA (MISCELLANEOUS) ×2 IMPLANT
DRAPE SHEET LG 3/4 BI-LAMINATE (DRAPES) ×3 IMPLANT
DRAPE SURG 17X23 STRL (DRAPES) ×3 IMPLANT
DURAPREP 26ML APPLICATOR (WOUND CARE) ×3 IMPLANT
ELECT BLADE 4.0 EZ CLEAN MEGAD (MISCELLANEOUS)
ELECT REM PT RETURN 9FT ADLT (ELECTROSURGICAL) ×3
ELECTRODE BLDE 4.0 EZ CLN MEGD (MISCELLANEOUS) IMPLANT
ELECTRODE REM PT RTRN 9FT ADLT (ELECTROSURGICAL) ×2 IMPLANT
GAUZE 4X4 16PLY RFD (DISPOSABLE) IMPLANT
GAUZE SPONGE 4X4 12PLY STRL (GAUZE/BANDAGES/DRESSINGS) IMPLANT
GLOVE BIO SURGEON STRL SZ7.5 (GLOVE) ×6 IMPLANT
GLOVE BIOGEL PI IND STRL 7.5 (GLOVE) ×4 IMPLANT
GLOVE BIOGEL PI INDICATOR 7.5 (GLOVE) ×3
GLOVE ECLIPSE 7.0 STRL STRAW (GLOVE) ×1 IMPLANT
GLOVE EXAM NITRILE LRG STRL (GLOVE) IMPLANT
GLOVE EXAM NITRILE XL STR (GLOVE) IMPLANT
GLOVE EXAM NITRILE XS STR PU (GLOVE) IMPLANT
GLOVE SURG SS PI 7.5 STRL IVOR (GLOVE) ×5 IMPLANT
GOWN STRL REUS W/ TWL LRG LVL3 (GOWN DISPOSABLE) ×8 IMPLANT
GOWN STRL REUS W/ TWL XL LVL3 (GOWN DISPOSABLE) IMPLANT
GOWN STRL REUS W/TWL 2XL LVL3 (GOWN DISPOSABLE) IMPLANT
GOWN STRL REUS W/TWL LRG LVL3 (GOWN DISPOSABLE) ×12
GOWN STRL REUS W/TWL XL LVL3 (GOWN DISPOSABLE)
GUIDEWIRE 18IN BLUNT CD HORIZ (WIRE) ×5 IMPLANT
HEMOSTAT POWDER KIT SURGIFOAM (HEMOSTASIS) ×3 IMPLANT
KIT BASIN OR (CUSTOM PROCEDURE TRAY) ×3 IMPLANT
KIT POSITION SURG JACKSON T1 (MISCELLANEOUS) ×3 IMPLANT
KIT SPINE MAZOR X ROBO DISP (MISCELLANEOUS) ×3 IMPLANT
KIT TURNOVER KIT B (KITS) ×3 IMPLANT
MILL MEDIUM DISP (BLADE) IMPLANT
NDL HYPO 18GX1.5 BLUNT FILL (NEEDLE) IMPLANT
NDL SPNL 18GX3.5 QUINCKE PK (NEEDLE) IMPLANT
NEEDLE HYPO 18GX1.5 BLUNT FILL (NEEDLE) IMPLANT
NEEDLE HYPO 22GX1.5 SAFETY (NEEDLE) ×3 IMPLANT
NEEDLE SPNL 18GX3.5 QUINCKE PK (NEEDLE) IMPLANT
NS IRRIG 1000ML POUR BTL (IV SOLUTION) ×3 IMPLANT
PACK LAMINECTOMY NEURO (CUSTOM PROCEDURE TRAY) ×3 IMPLANT
PAD ARMBOARD 7.5X6 YLW CONV (MISCELLANEOUS) ×9 IMPLANT
PIN HEAD 2.5X60MM (PIN) IMPLANT
ROD 40MM SOLERA PREBENT (Rod) ×1 IMPLANT
ROD SPINAL 5.5X35 CP 4 TI (Rod) ×1 IMPLANT
SCREW SCHANZ SA 4.0MM (MISCELLANEOUS) IMPLANT
SCREW SET SOLERA (Screw) ×12 IMPLANT
SCREW SET SOLERA TI5.5 (Screw) IMPLANT
SCREW SOLERA 40X6.5XMA (Screw) IMPLANT
SCREW SOLERA 45X6.5XMA (Screw) IMPLANT
SCREW SOLERA 6.5X40MM (Screw) ×6 IMPLANT
SCREW SOLERA 6.5X45MM (Screw) ×6 IMPLANT
SPONGE LAP 4X18 RFD (DISPOSABLE) IMPLANT
SPONGE SURGIFOAM ABS GEL 100 (HEMOSTASIS) IMPLANT
STRIP CLOSURE SKIN 1/2X4 (GAUZE/BANDAGES/DRESSINGS) IMPLANT
SUT MNCRL AB 3-0 PS2 18 (SUTURE) ×3 IMPLANT
SUT VIC AB 0 CT1 18XCR BRD8 (SUTURE) ×2 IMPLANT
SUT VIC AB 0 CT1 8-18 (SUTURE) ×3
SUT VIC AB 2-0 CP2 18 (SUTURE) ×5 IMPLANT
SYR 3ML LL SCALE MARK (SYRINGE) IMPLANT
TOWEL GREEN STERILE (TOWEL DISPOSABLE) ×3 IMPLANT
TOWEL GREEN STERILE FF (TOWEL DISPOSABLE) ×3 IMPLANT
TRAY FOLEY MTR SLVR 16FR STAT (SET/KITS/TRAYS/PACK) ×2 IMPLANT
TUBE MAZOR SA REDUCTION (TUBING) ×3 IMPLANT
WATER STERILE IRR 1000ML POUR (IV SOLUTION) ×3 IMPLANT

## 2019-03-12 NOTE — Op Note (Signed)
PATIENT: Julie Ross  DAY OF SURGERY: 03/12/19   PRE-OPERATIVE DIAGNOSIS:  Lumbar radiculopathy, Bilateral L4-5 facet synovial cysts   POST-OPERATIVE DIAGNOSIS:  Same   PROCEDURE:  L4 laminectomy, left L4-L5 open transforaminal lumbar interbody fusion with bilateral L4-L5 pedicle screw placement with robotic guidance   SURGEON:  Surgeon(s) and Role:    Judith Part, MD - Primary    Consuella Lose, MD - Assisting   ANESTHESIA: ETGA   BRIEF HISTORY: This is a 58 year old woman who presented with worsening bilateral lower extremity radicular pain. The patient was found to have bilateral synovial cysts at L4-5 with severe spondylosis. The synovial cysts were essentially sequestering the thecal sac posteriorly between them and compressing it. I thought that this was the most likely cause of her radicular pain and recommended removal of the synovial cysts and facetectomies and therefore an instrumented fusion. This was discussed with the patient as well as risks, benefits, and alternatives and wished to proceed with surgical treatment.   OPERATIVE DETAIL:  The patient was taken to the operating room and placed on the OR table in the prone position. A formal time out was performed with two patient identifiers and confirmed the operative site. Anesthesia was induced by the anesthesia team. The operative site was marked, hair was clipped with surgical clippers, the area was then prepped and draped in a sterile fashion.   Fluoroscopy was used to localize the surgical level and a midline incision was created. Subperiosteal dissection was performed bilaterally to expose L4-5 and a repeat fluoroscopic image was used to confirm the surgical level.   A spinous process clamp was placed and the Mazor robot was then attached to the table and draped in a sterile fashion. The robot was registered using fluoroscopy with good fit. The robotic arm was used to help guide K wires into the bilateral L4 and  L5 pedicles, which were then secured.   The L4 lamina was then removed using a combination of rongeurs and high speed drill, saving the bone for autograft. Synovial cysts were encountered bilaterally and the thecal sac was clearly deformed. It was difficult to get a plane in between the dura and synovial cyst, so I removed the superior portion of the L5 lamina to get to a normal plane and tracked this upward. I was able to decompress the cord and remove the cyst contents and lateral wall, but the medial wall was densely adherent to the dura. Given that the thecal sac was well decompressed, I left the medial cyst wall on the dura. The cyst was resected laterally until it was the thecal sac met the vertebral body.   A complete facetectomy was then performed on the left at L4-5 for the TLIF. The exiting and traversing nerve roots were identified and protected during disc work. An annulotomy was created and, using fluoroscopy as needed, the disc material was removed using a combination of pituitary rongeurs and disc space shavers. The endplates were prepared using a curette, a graft was sized, and the disc space was packed with autograft. While protecting the nerve roots, an expandable interbody graft was placed in the L4-5 disc space and expanded, with location confirmed with fluoroscopy.   The K wires were used palpate the bottom of the pedicle tract to confirm they were still in the appropriate location. On the right at L4, I could not appreciate a bony floor with the K-wire. So, to be sure, I removed it and used fluoroscopy to  guide a pedicle awl, feel the borders, tap the trajectory, feel the borders, check location with fluoro, then place the right L4 screw. The other wires felt good and fluoro confirmed good location, so a tap and then screw were placed on the left at L4 and bilaterally at L5.  A rod was sized and introduced on both sides, confirmed with fluoroscopy, then final tightened. The right L4-5  facet and bilateral TPs were decorticated and fusion material was laid along these surfaces for a posterolateral fusion. Hemostasis was again confirmed for both incisions, they were copiously irrigated, and then closed in layers.    EBL:  271m   DRAINS: none   SPECIMENS: none   TJudith Part MD 03/12/19 7:47 AM

## 2019-03-12 NOTE — Transfer of Care (Signed)
Immediate Anesthesia Transfer of Care Note  Patient: ARIELLY ZARLENGO  Procedure(s) Performed: Lumbar four-five Open transforaminal lumbar interbody fusion with Lumbar four-five laminectomy, bilateral Lumbar four-five Pedicle screw placement (Bilateral Spine Lumbar) APPLICATION OF ROBOTIC ASSISTANCE FOR SPINAL PROCEDURE (N/A )  Patient Location: PACU  Anesthesia Type:General  Level of Consciousness: sedated  Airway & Oxygen Therapy: Patient Spontanous Breathing and Patient connected to face mask oxygen  Post-op Assessment: Report given to RN and Post -op Vital signs reviewed and stable  Post vital signs: Reviewed and stable  Last Vitals:  Vitals Value Taken Time  BP 124/50 03/12/19 1251  Temp    Pulse 59 03/12/19 1255  Resp 19 03/12/19 1255  SpO2 93 % 03/12/19 1255  Vitals shown include unvalidated device data.  Last Pain:  Vitals:   03/12/19 0649  TempSrc:   PainSc: 10-Worst pain ever      Patients Stated Pain Goal: 3 ([id number])  Complications: No apparent anesthesia complications

## 2019-03-12 NOTE — Brief Op Note (Signed)
03/12/2019  1:20 PM  PATIENT:  Julie Ross  58 y.o. female  PRE-OPERATIVE DIAGNOSIS:  Lumbar stenosis without neurogenic claudication  POST-OPERATIVE DIAGNOSIS:  Lumbar stenosis without neurogenic claudication  PROCEDURE:  Procedure(s): Lumbar four-five Open transforaminal lumbar interbody fusion with Lumbar four-five laminectomy, bilateral Lumbar four-five Pedicle screw placement (Bilateral) APPLICATION OF ROBOTIC ASSISTANCE FOR SPINAL PROCEDURE (N/A)  SURGEON:  Surgeon(s) and Role:    * Avedis Bevis, Joyice Faster, MD - Primary    * Consuella Lose, MD - Assisting  PHYSICIAN ASSISTANT:   ANESTHESIA:   general  EBL:  150 mL   BLOOD ADMINISTERED:none  DRAINS: none   LOCAL MEDICATIONS USED:  LIDOCAINE   SPECIMEN:  No Specimen  DISPOSITION OF SPECIMEN:  N/A  COUNTS:  YES  TOURNIQUET:  * No tourniquets in log *  DICTATION: .Note written in EPIC  PLAN OF CARE: Admit to inpatient   PATIENT DISPOSITION:  PACU - hemodynamically stable.   Delay start of Pharmacological VTE agent (>24hrs) due to surgical blood loss or risk of bleeding: yes

## 2019-03-12 NOTE — Anesthesia Procedure Notes (Signed)
Procedure Name: Intubation Date/Time: 03/12/2019 7:38 AM Performed by: Clearnce Sorrel, CRNA Pre-anesthesia Checklist: Patient identified, Emergency Drugs available, Suction available, Patient being monitored and Timeout performed Patient Re-evaluated:Patient Re-evaluated prior to induction Oxygen Delivery Method: Circle system utilized Preoxygenation: Pre-oxygenation with 100% oxygen Induction Type: IV induction Ventilation: Mask ventilation without difficulty Laryngoscope Size: Mac and 3 Grade View: Grade III Tube type: Oral Tube size: 7.5 mm Number of attempts: 1 Airway Equipment and Method: Stylet Placement Confirmation: positive ETCO2 and breath sounds checked- equal and bilateral Secured at: 23 cm Tube secured with: Tape Dental Injury: Teeth and Oropharynx as per pre-operative assessment

## 2019-03-12 NOTE — H&P (Addendum)
Surgical H&P Update  HPI: 58 y.o. woman with bilateral lower extremity radicular pain. She unfortunately failed non-surgical treatment and an MRI showed bilateral L4-5 synovial cysts with lateral recess stenosis and severe spondylosis. No changes in health since she was last seen. Still having pain and wishes to proceed with surgery.  PMHx:  Past Medical History:  Diagnosis Date  . Anemia    h/o - ? bld. transfusion - more recent- ?2013  . Anxiety    uses xanax mainly for sleep  . Arthritis    knees   . Breast cancer ([facility]) 11/2011   lul/ER+PR+, x1 lymph node   . Cancer ([facility])    squamous cell skin cancer x7  . Depression   . GERD (gastroesophageal reflux disease)   . H/O bone density study 03/2011  . H/O colonoscopy   . H/O echocardiogram    last one 02/2013, due to chemotherapy  . Heart burn   . Hx of radiation therapy 04/17/12- 06/04/12   left chest wall, axilla, L supraclavicular fossa 4500 cGy, left mastectomy scar/chest wall 5940 cGy  . Hypertension   . Hypothyroidism   . Multiple sclerosis ([place])   . Neuromuscular disorder ([facility])    MS TX WITH CYMBALTA   . Personal history of chemotherapy   . Personal history of radiation therapy   . Sleep apnea    MILD SLEEP APNEA, NO (Needed) MACHINE  6 YRS AGO HPT [facility]   . Wears glasses    FamHx:  Family History  Problem Relation Age of Onset  . Breast cancer Paternal Aunt        dx in her 101s  . Heart disease Maternal Grandfather   . Lung cancer Maternal Uncle        smoker  . Breast cancer Paternal Grandmother        dx in her 23s  . Ovarian cancer Paternal Aunt        dx in her 60s  . Arthritis Mother   . Hypertension Mother   . Heart disease Mother        CHF  . Dementia Father   . Diabetes type II Father    SocHx:  reports that she has never smoked. She has never used smokeless tobacco. She reports that she does not drink alcohol or use drugs.  Physical Exam: AOx3, PERRL, FS, TM  Strength 5/5 x4,  SILTx4  Assesment/Plan: 58 y.o. woman with BLE radicular pain 2/2 b/l L4-5 synovial cysts, here for L4-5 open decompression and TLIF. Risks, benefits, and alternatives discussed and the patient would like to continue with surgery.  -OR today -3W post-op  Judith Part, MD 03/12/19 7:12 AM

## 2019-03-12 NOTE — Anesthesia Preprocedure Evaluation (Signed)
Anesthesia Evaluation  Patient identified by MRN, date of birth, ID band Patient awake    Reviewed: Allergy & Precautions, NPO status , Patient's Chart, lab work & pertinent test results  History of Anesthesia Complications Negative for: history of anesthetic complications  Airway Mallampati: III  TM Distance: >3 FB Neck ROM: Full    Dental  (+) Dental Advisory Given   Pulmonary sleep apnea , neg recent URI,    breath sounds clear to auscultation       Cardiovascular hypertension, Pt. on medications (-) angina(-) Past MI and (-) CHF  Rhythm:Regular     Neuro/Psych PSYCHIATRIC DISORDERS Anxiety Depression  Neuromuscular disease    GI/Hepatic Neg liver ROS, GERD  Medicated and Controlled,  Endo/Other  Hypothyroidism   Renal/GU negative Renal ROS     Musculoskeletal  (+) Arthritis ,   Abdominal   Peds  Hematology  (+) anemia ,   Anesthesia Other Findings   Reproductive/Obstetrics                             Anesthesia Physical Anesthesia Plan  ASA: III  Anesthesia Plan: General   Post-op Pain Management:    Induction: Intravenous  PONV Risk Score and Plan: 3 and Ondansetron and Dexamethasone  Airway Management Planned: Oral ETT  Additional Equipment: None  Intra-op Plan:   Post-operative Plan: Extubation in OR  Informed Consent: I have reviewed the patients History and Physical, chart, labs and discussed the procedure including the risks, benefits and alternatives for the proposed anesthesia with the patient or authorized representative who has indicated his/her understanding and acceptance.     Dental advisory given  Plan Discussed with: CRNA and Surgeon  Anesthesia Plan Comments:         Anesthesia Quick Evaluation

## 2019-03-12 NOTE — Progress Notes (Signed)
Neurosurgery Service Post-operative progress note  Assessment & Plan: 58 y.o. woman s/p L4-5 TLIF, seen in recovery, still somnolent but MAEx4 without preference with SILTx4.  -admit to 3C post-op -ADAT, activity as tolerated  Judith Part  03/12/19 1:24 PM

## 2019-03-13 MED ORDER — ALUM & MAG HYDROXIDE-SIMETH 200-200-20 MG/5ML PO SUSP
30.0000 mL | ORAL | Status: DC | PRN
  Administered 2019-03-13 – 2019-03-15 (×3): 30 mL via ORAL
  Filled 2019-03-13 (×2): qty 30

## 2019-03-13 NOTE — Anesthesia Postprocedure Evaluation (Signed)
Anesthesia Post Note  Patient: MAARI VALENCIA  Procedure(s) Performed: Lumbar four-five Open transforaminal lumbar interbody fusion with Lumbar four-five laminectomy, bilateral Lumbar four-five Pedicle screw placement (Bilateral Spine Lumbar) APPLICATION OF ROBOTIC ASSISTANCE FOR SPINAL PROCEDURE (N/A )     Patient location during evaluation: PACU Anesthesia Type: General Level of consciousness: awake and alert Pain management: pain level controlled Vital Signs Assessment: post-procedure vital signs reviewed and stable Respiratory status: spontaneous breathing, nonlabored ventilation, respiratory function stable and patient connected to nasal cannula oxygen Cardiovascular status: blood pressure returned to baseline and stable Postop Assessment: no apparent nausea or vomiting Anesthetic complications: no    Last Vitals:  Vitals:   03/13/19 1202 03/13/19 1601  BP: (!) 115/57 120/62  Pulse: 78 78  Resp: 18 18  Temp: 36.5 C 36.7 C  SpO2: 100% 100%    Last Pain:  Vitals:   03/13/19 1655  TempSrc:   PainSc: 7                  Bryna Razavi

## 2019-03-13 NOTE — Progress Notes (Signed)
Neurosurgery Service Progress Note  Subjective: No acute events overnight, leg pain improved, expected back soreness   Objective: Vitals:   03/12/19 2237 03/13/19 0404 03/13/19 0630 03/13/19 0747  BP: 121/64 109/68 116/61 114/61  Pulse: 80 79  78  Resp: 20 20  18   Temp: 98 F (36.7 C) (!) 97.5 F (36.4 C)  98 F (36.7 C)  TempSrc: Oral Oral  Oral  SpO2: 95% 95%  98%  Weight:      Height:       Temp (24hrs), Avg:98 F (36.7 C), Min:97.5 F (36.4 C), Max:98.7 F (37.1 C)  CBC Latest Ref Rng & Units 03/06/2019 04/02/2018 06/26/2017  WBC 4.0 - 10.5 K/uL 5.7 6.3 8.9  Hemoglobin 12.0 - 15.0 g/dL 11.1(L) 10.3(L) 11.2(L)  Hematocrit 36.0 - 46.0 % 34.0(L) 32.2(L) 34.1(L)  Platelets 150 - 400 K/uL 263 304.0 314   BMP Latest Ref Rng & Units 03/06/2019 04/02/2018 06/26/2017  Glucose 70 - 99 mg/dL 114(H) 153(H) 133(H)  BUN 6 - 20 mg/dL 20 21 19   Creatinine 0.44 - 1.00 mg/dL 1.21(H) 1.27(H) 1.29(H)  BUN/Creat Ratio 6 - 22 (calc) - - 15  Sodium 135 - 145 mmol/L 136 137 133(L)  Potassium 3.5 - 5.1 mmol/L 3.5 3.3(L) 3.5  Chloride 98 - 111 mmol/L 102 100 97(L)  CO2 22 - 32 mmol/L 22 27 24   Calcium 8.9 - 10.3 mg/dL 9.4 9.4 9.5    Intake/Output Summary (Last 24 hours) at 03/13/2019 0839 Last data filed at 03/13/2019 0729 Gross per 24 hour  Intake 2400 ml  Output 2765 ml  Net -365 ml    Current Facility-Administered Medications:  .  0.9 %  sodium chloride infusion, 250 mL, Intravenous, Continuous, Leisel Pinette A, MD .  acetaminophen (TYLENOL) tablet 650 mg, 650 mg, Oral, Q4H PRN, 650 mg at 03/13/19 0448 **OR** acetaminophen (TYLENOL) suppository 650 mg, 650 mg, Rectal, Q4H PRN, Judith Part, MD .  ALPRAZolam Duanne Moron) tablet 0.5 mg, 0.5 mg, Oral, TID PRN, Judith Part, MD .  alum & mag hydroxide-simeth (MAALOX/MYLANTA) 200-200-20 MG/5ML suspension 30 mL, 30 mL, Oral, Q4H PRN, Judith Part, MD, 30 mL at 03/13/19 0039 .  amLODipine (NORVASC) tablet 10 mg, 10 mg, Oral,  Daily, Landri Dorsainvil A, MD .  atorvastatin (LIPITOR) tablet 20 mg, 20 mg, Oral, q1800, Judith Part, MD, 20 mg at 03/12/19 1946 .  baclofen (LIORESAL) tablet 10 mg, 10 mg, Oral, TID PRN, Judith Part, MD, 10 mg at 03/13/19 0749 .  buPROPion (WELLBUTRIN XL) 24 hr tablet 150 mg, 150 mg, Oral, Daily, Ellorie Kindall A, MD .  docusate sodium (COLACE) capsule 100 mg, 100 mg, Oral, BID, Judith Part, MD, 100 mg at 03/12/19 1946 .  gabapentin (NEURONTIN) tablet 600 mg, 600 mg, Oral, BID, Bodey Frizell A, MD .  losartan (COZAAR) tablet 100 mg, 100 mg, Oral, Daily **AND** hydrochlorothiazide (HYDRODIURIL) tablet 25 mg, 25 mg, Oral, Daily, Tabithia Stroder A, MD .  HYDROmorphone (DILAUDID) injection 0.5 mg, 0.5 mg, Intravenous, Q3H PRN, Judith Part, MD .  letrozole [facility]) tablet 2.5 mg, 2.5 mg, Oral, Daily, Judith Part, MD, 2.5 mg at 03/12/19 2137 .  levothyroxine (SYNTHROID) tablet 88 mcg, 88 mcg, Oral, QAC breakfast, Judith Part, MD, 88 mcg at 03/13/19 0449 .  menthol-cetylpyridinium (CEPACOL) lozenge 3 mg, 1 lozenge, Oral, PRN **OR** phenol (CHLORASEPTIC) mouth spray 1 spray, 1 spray, Mouth/Throat, PRN, Darey Hershberger A, MD .  ondansetron (ZOFRAN) tablet 4 mg, 4  mg, Oral, Q6H PRN **OR** ondansetron (ZOFRAN) injection 4 mg, 4 mg, Intravenous, Q6H PRN, Tatiyanna Lashley A, MD .  oxyCODONE (Oxy IR/ROXICODONE) immediate release tablet 10 mg, 10 mg, Oral, Q4H PRN, Judith Part, MD, 10 mg at 03/13/19 0749 .  oxyCODONE (Oxy IR/ROXICODONE) immediate release tablet 5 mg, 5 mg, Oral, Q4H PRN, Judith Part, MD, 5 mg at 03/13/19 0046 .  pantoprazole (PROTONIX) EC tablet 40 mg, 40 mg, Oral, Daily, Chima Astorino A, MD .  polyethylene glycol (MIRALAX / GLYCOLAX) packet 17 g, 17 g, Oral, Daily PRN, Kasai Beltran A, MD .  potassium chloride SA (K-DUR) CR tablet 20 mEq, 20 mEq, Oral, Daily, Severn Goddard, Joyice Faster, MD, 20 mEq at 03/12/19 1947 .   sodium chloride flush (NS) 0.9 % injection 3 mL, 3 mL, Intravenous, Q12H, Yeraldin Litzenberger A, MD .  sodium chloride flush (NS) 0.9 % injection 3 mL, 3 mL, Intravenous, PRN, Aubreyana Saltz, Joyice Faster, MD   Physical Exam: AOx3, PERRL, EOMI, FS, Strength 5/5 x4, SILTx4 Incision c/d/i  Assessment & Plan: 58 y.o. woman s/p open L4-5 TLIF, recovering well.  -advance activity as tolerated -PT/OT recs -SCDs/TEDs -24h PPx ABx  Judith Part  03/13/19 8:39 AM

## 2019-03-13 NOTE — Progress Notes (Signed)
Occupational Therapy Evaluation Patient Details Name: Julie Ross MRN: [id number] DOB: 1960/08/19 Today's Date: 03/13/2019    History of Present Illness Pt is 58 yo female s/p lumbar interbody fusion of L4-5 and lumbar laminectomy due to BLE radicular pain, bilateral L4-5 synovial cysts with lateral recess stenosis and severe spondylosis. PMH including MS, breast cancer, hypothyroidism, HTN, bilateral TKAs, and sleep apnea.    Clinical Impression   PTA, pt lived in two level home with her husband, and was independent with ADLs and IADLs. Pt currently presenting with decreased balance, strength, activity tolerance, and increased pain impacting ability to safely perform ADLs. Provided education regarding brace management and compensatory strategies for LB ADLs. Pt performed bed mobility with min A for safety; performed LB dressing, functional mobility, and shower transfer with min guard A for balance and safety; performed brace management with supervision and VCs for safety. Pt would benefit from receiving OT services to address deficits in balance, safety, and knowledge of precautions in preparation for ADLs. Recommend dc home with HHOT to address balance and safety.      Follow Up Recommendations  Home health OT    Equipment Recommendations  None recommended by OT    Recommendations for Other Services PT consult     Precautions / Restrictions Precautions Precautions: Back;Fall Precaution Booklet Issued: Yes (comment) Required Braces or Orthoses: Other Brace Other Brace: No brace per MD order, brace in room and donned for comfort Restrictions Weight Bearing Restrictions: No      Mobility Bed Mobility Overal bed mobility: Needs Assistance Bed Mobility: Rolling;Sidelying to Sit Rolling: Min assist Sidelying to sit: Min assist       General bed mobility comments: Provided education on log-rolling technique for bed mobility. Pt required min A for log rolling and side-lying to sit  to bring feet off EOB.   Transfers Overall transfer level: Needs assistance Equipment used: Rolling walker (2 wheeled);None Transfers: Sit to/from Stand Sit to Stand: Min guard         General transfer comment: Pt performed sit<>stand with min guard A for safety    Balance Overall balance assessment: Needs assistance Sitting-balance support: No upper extremity supported;Feet supported Sitting balance-Leahy Scale: Fair     Standing balance support: Bilateral upper extremity supported;During functional activity;No upper extremity supported Standing balance-Leahy Scale: Fair Standing balance comment: Pt able to maintain balance for short period during LB dressing                           ADL either performed or assessed with clinical judgement   ADL Overall ADL's : Needs assistance/impaired Eating/Feeding: Independent;Sitting   Grooming: Min guard;Standing   Upper Body Bathing: Supervision/ safety;Sitting   Lower Body Bathing: Min guard;Sit to/from stand   Upper Body Dressing : Supervision/safety;Sitting Upper Body Dressing Details (indicate cue type and reason): Provided education for donning/doffing brace and brace management. pt donned brace with supervision and VCs for safety Lower Body Dressing: Min guard;Sit to/from stand Lower Body Dressing Details (indicate cue type and reason): Provided education on figure 4 method for LB ADLs. Pt donned LB clothing with min guard A and VCs for safety Toilet Transfer: Min guard;Ambulation;RW(simulated to recliner) Toilet Transfer Details (indicate cue type and reason): simulated to recliner. Pt required min guard A for safety Toileting- Clothing Manipulation and Hygiene: Min guard;Sit to/from stand   Tub/ Shower Transfer: Walk-in shower;Min guard;Ambulation;Shower Technical sales engineer Details (indicate cue type and reason): Pt performed  shower transfer with min guard A for safety Functional mobility  during ADLs: Min guard;Rolling walker General ADL Comments: Pt reports that she previously used a walker for 2 years following her knee replacement. Pt required min guard A for ADLs for safety and maintaining back precautions     Vision Baseline Vision/History: Wears glasses Wears Glasses: At all times Patient Visual Report: No change from baseline       Perception     Praxis      Pertinent Vitals/Pain Pain Assessment: 0-10 Pain Score: 10-Worst pain ever Pain Location: back Pain Descriptors / Indicators: Aching;Discomfort;Grimacing;Operative site guarding;Sore Pain Intervention(s): Premedicated before session;Monitored during session;Limited activity within patient's tolerance;Repositioned     Hand Dominance Right   Extremity/Trunk Assessment Upper Extremity Assessment Upper Extremity Assessment: Overall WFL for tasks assessed   Lower Extremity Assessment Lower Extremity Assessment: Defer to PT evaluation   Cervical / Trunk Assessment Cervical / Trunk Assessment: Other exceptions Cervical / Trunk Exceptions: s/p back surgery   Communication Communication Communication: No difficulties   Cognition Arousal/Alertness: Awake/alert Behavior During Therapy: WFL for tasks assessed/performed;Anxious(slightly anxious with bed mobility) Overall Cognitive Status: Within Functional Limits for tasks assessed                                     General Comments       Exercises     Shoulder Instructions      Home Living Family/patient expects to be discharged to:: Private residence Living Arrangements: Spouse/significant other Available Help at Discharge: Family Type of Home: House Home Access: Stairs to enter Technical brewer of Steps: 1 Entrance Stairs-Rails: None Home Layout: Two level;1/2 bath on main level;Bed/bath upstairs(Pt reported that she will probably sleep in recliner )     Bathroom Shower/Tub: Hospital doctor Toilet:  Handicapped height     Home Equipment: Environmental consultant - 2 wheels;Bedside commode;Shower seat;Grab bars - toilet;Grab bars - tub/shower          Prior Functioning/Environment Level of Independence: Independent        Comments: Pt independent with ADLs and IADLs        OT Problem List: Decreased range of motion;Decreased activity tolerance;Impaired balance (sitting and/or standing);Decreased safety awareness;Decreased knowledge of use of DME or AE;Decreased knowledge of precautions;Pain      OT Treatment/Interventions: Self-care/ADL training;DME and/or AE instruction;Balance training    OT Goals(Current goals can be found in the care plan section) Acute Rehab OT Goals Patient Stated Goal: stop hurting OT Goal Formulation: With patient Time For Goal Achievement: 03/27/19 Potential to Achieve Goals: Good ADL Goals Pt Will Perform Grooming: with modified independence;standing Pt Will Perform Upper Body Dressing: with modified independence;sitting Pt Will Perform Lower Body Dressing: with supervision;sit to/from stand Pt Will Transfer to Toilet: with supervision;ambulating;bedside commode Pt Will Perform Toileting - Clothing Manipulation and hygiene: with supervision;sit to/from stand Pt Will Perform Tub/Shower Transfer: with supervision;ambulating;shower seat;rolling walker;grab bars Additional ADL Goal #1: Pt will verbalize 3/3 back precautions with 1 verbal cue Additional ADL Goal #2: Pt will perform bed mobility using compensatory strategies with supervision  OT Frequency: Min 3X/week   Barriers to D/C:            Co-evaluation              AM-PAC OT "6 Clicks" Daily Activity     Outcome Measure Help from another person eating meals?: None Help from another  person taking care of personal grooming?: A Little Help from another person toileting, which includes using toliet, bedpan, or urinal?: A Little Help from another person bathing (including washing, rinsing, drying)?:  A Little Help from another person to put on and taking off regular upper body clothing?: None Help from another person to put on and taking off regular lower body clothing?: A Little 6 Click Score: 20   End of Session Equipment Utilized During Treatment: Rolling walker;Back brace Nurse Communication: Mobility status  Activity Tolerance: Patient tolerated treatment well Patient left: in chair;with call bell/phone within reach  OT Visit Diagnosis: Unsteadiness on feet (R26.81);Other abnormalities of gait and mobility (R26.89);Muscle weakness (generalized) (M62.81);Pain Pain - part of body: (back)                Time: [id number] OT Time Calculation (min): 32 min Charges:  OT General Charges $OT Visit: 1 Visit OT Evaluation $OT Eval Moderate Complexity: 1 Mod OT Treatments $Self Care/Home Management : 8-22 mins  Gus Rankin, OT Student  Di Kindle Mariaisabel Bodiford 03/13/2019, 11:09 AM

## 2019-03-13 NOTE — Evaluation (Signed)
Physical Therapy Evaluation Patient Details Name: Julie Ross MRN: [id number] DOB: [birth year] Today's Date: 03/13/2019   History of Present Illness  Pt is 58 yo female s/p lumbar interbody fusion of L4-5 and lumbar laminectomy due to BLE radicular pain, bilateral L4-5 synovial cysts with lateral recess stenosis and severe spondylosis. PMH including MS, breast cancer, hypothyroidism, HTN, bilateral TKAs, and sleep apnea.   Clinical Impression  Pt admitted with above diagnosis. At the time of PT eval, pt was able to demonstrate transfers and ambulation with gross min guard assist but min assist required for LE elevation back up in bed at end of session. Pt was educated on precautions, brace application/wearing schedule, appropriate activity progression, and car transfer. Pt currently with functional limitations due to the deficits listed below (see PT Problem List). Pt will benefit from skilled PT to increase their independence and safety with mobility to allow discharge to the venue listed below.    Follow Up Recommendations No PT follow up;Supervision for mobility/OOB    Equipment Recommendations  None recommended by PT    Recommendations for Other Services       Precautions / Restrictions Precautions Precautions: Back;Fall Precaution Booklet Issued: Yes (comment) Required Braces or Orthoses: Other Brace Other Brace: No brace per MD order, brace in room and donned for comfort Restrictions Weight Bearing Restrictions: No      Mobility  Bed Mobility Overal bed mobility: Needs Assistance Bed Mobility: Rolling;Sit to Sidelying Rolling: Supervision       Sit to sidelying: Min assist General bed mobility comments: Very light min assist to elevate LE's back up into bed at end of session. Pt with difficulty with optimal log roll technique due to pain but was able to complete fairly well.   Transfers Overall transfer level: Needs assistance Equipment used: Rolling walker (2  wheeled);None Transfers: Sit to/from Stand Sit to Stand: Min guard         General transfer comment: Pt performed sit<>stand with min guard A for safety. Increased time due to pain.   Ambulation/Gait Ambulation/Gait assistance: Min guard Gait Distance (Feet): 200 Feet Assistive device: Rolling walker (2 wheeled) Gait Pattern/deviations: Step-through pattern;Decreased stride length;Trunk flexed Gait velocity: Decreased Gait velocity interpretation: <1.8 ft/sec, indicate of risk for recurrent falls General Gait Details: VC's for improved posture and general safety.   Stairs Stairs: Yes Stairs assistance: Min guard Stair Management: One rail Right;Step to pattern;Forwards(HHA on L) Number of Stairs: 10 General stair comments: VC's for sequencing and general safety. HHA utilized on L side to simulate B railings at home. Pt moving slow and reports pain due to baseline bilateral knee pain.   Wheelchair Mobility    Modified Rankin (Stroke Patients Only)       Balance Overall balance assessment: Needs assistance Sitting-balance support: No upper extremity supported;Feet supported Sitting balance-Leahy Scale: Fair     Standing balance support: Bilateral upper extremity supported;During functional activity;No upper extremity supported Standing balance-Leahy Scale: Fair Standing balance comment: Pt able to maintain balance for short period during LB dressing                             Pertinent Vitals/Pain Pain Assessment: Faces Faces Pain Scale: Hurts even more Pain Location: back Pain Descriptors / Indicators: Aching;Discomfort;Grimacing;Operative site guarding;Sore Pain Intervention(s): Monitored during session    Home Living Family/patient expects to be discharged to:: Private residence Living Arrangements: Spouse/significant other Available Help at Discharge: Family Type of Home:  House Home Access: Stairs to enter Entrance Stairs-Rails: None Entrance  Stairs-Number of Steps: 1 Home Layout: Two level;1/2 bath on main level;Bed/bath upstairs(Pt reported that she will probably sleep in recliner ) Home Equipment: Walker - 2 wheels;Bedside commode;Shower seat;Grab bars - toilet;Grab bars - tub/shower      Prior Function Level of Independence: Independent         Comments: Pt independent with ADLs and IADLs     Hand Dominance   Dominant Hand: Right    Extremity/Trunk Assessment   Upper Extremity Assessment Upper Extremity Assessment: Defer to OT evaluation    Lower Extremity Assessment Lower Extremity Assessment: Generalized weakness(Consistent with pre-op diagnosis)    Cervical / Trunk Assessment Cervical / Trunk Assessment: Other exceptions Cervical / Trunk Exceptions: s/p back surgery  Communication   Communication: No difficulties  Cognition Arousal/Alertness: Awake/alert Behavior During Therapy: WFL for tasks assessed/performed Overall Cognitive Status: Within Functional Limits for tasks assessed                                        General Comments      Exercises     Assessment/Plan    PT Assessment Patient needs continued PT services  PT Problem List Decreased strength;Decreased balance;Decreased activity tolerance;Decreased mobility;Decreased knowledge of use of DME;Decreased safety awareness;Decreased knowledge of precautions;Pain       PT Treatment Interventions DME instruction;Gait training;Stair training;Functional mobility training;Therapeutic activities;Therapeutic exercise;Neuromuscular re-education;Patient/family education    PT Goals (Current goals can be found in the Care Plan section)  Acute Rehab PT Goals Patient Stated Goal: stop hurting PT Goal Formulation: With patient Time For Goal Achievement: 03/20/19 Potential to Achieve Goals: Good    Frequency Min 5X/week   Barriers to discharge        Co-evaluation               AM-PAC PT "6 Clicks" Mobility   Outcome Measure Help needed turning from your back to your side while in a flat bed without using bedrails?: None Help needed moving from lying on your back to sitting on the side of a flat bed without using bedrails?: A Little Help needed moving to and from a bed to a chair (including a wheelchair)?: A Little Help needed standing up from a chair using your arms (e.g., wheelchair or bedside chair)?: A Little Help needed to walk in hospital room?: A Little Help needed climbing 3-5 steps with a railing? : A Little 6 Click Score: 19    End of Session Equipment Utilized During Treatment: Gait belt Activity Tolerance: Patient tolerated treatment well Patient left: in bed;with call bell/phone within reach Nurse Communication: Mobility status PT Visit Diagnosis: Unsteadiness on feet (R26.81);Pain Pain - part of body: (back)    Time: [id number] PT Time Calculation (min) (ACUTE ONLY): 25 min   Charges:   PT Evaluation $PT Eval Moderate Complexity: 1 Mod PT Treatments $Gait Training: 8-22 mins        Rolinda Roan, PT, DPT Acute Rehabilitation Services Pager: [phone] Office: [phone]   Thelma Comp 03/13/2019, 2:38 PM

## 2019-03-14 ENCOUNTER — Inpatient Hospital Stay (HOSPITAL_COMMUNITY): Payer: Commercial Managed Care - PPO

## 2019-03-14 DIAGNOSIS — R6510 Systemic inflammatory response syndrome (SIRS) of non-infectious origin without acute organ dysfunction: Secondary | ICD-10-CM

## 2019-03-14 DIAGNOSIS — M5416 Radiculopathy, lumbar region: Secondary | ICD-10-CM

## 2019-03-14 DIAGNOSIS — D509 Iron deficiency anemia, unspecified: Secondary | ICD-10-CM

## 2019-03-14 DIAGNOSIS — J9621 Acute and chronic respiratory failure with hypoxia: Secondary | ICD-10-CM

## 2019-03-14 LAB — CBC
HCT: 30.9 % — ABNORMAL LOW (ref 36.0–46.0)
Hemoglobin: 9.5 g/dL — ABNORMAL LOW (ref 12.0–15.0)
MCH: 23.5 pg — ABNORMAL LOW (ref 26.0–34.0)
MCHC: 30.7 g/dL (ref 30.0–36.0)
MCV: 76.5 fL — ABNORMAL LOW (ref 80.0–100.0)
Platelets: 264 10*3/uL (ref 150–400)
RBC: 4.04 MIL/uL (ref 3.87–5.11)
RDW: 16 % — ABNORMAL HIGH (ref 11.5–15.5)
WBC: 13.5 10*3/uL — ABNORMAL HIGH (ref 4.0–10.5)
nRBC: 0 % (ref 0.0–0.2)

## 2019-03-14 LAB — RENAL FUNCTION PANEL
Albumin: 3.8 g/dL (ref 3.5–5.0)
Anion gap: 15 (ref 5–15)
BUN: 25 mg/dL — ABNORMAL HIGH (ref 6–20)
CO2: 21 mmol/L — ABNORMAL LOW (ref 22–32)
Calcium: 8.6 mg/dL — ABNORMAL LOW (ref 8.9–10.3)
Chloride: 97 mmol/L — ABNORMAL LOW (ref 98–111)
Creatinine, Ser: 1.37 mg/dL — ABNORMAL HIGH (ref 0.44–1.00)
GFR calc Af Amer: 48 mL/min — ABNORMAL LOW (ref >60)
GFR calc non Af Amer: 42 mL/min — ABNORMAL LOW (ref >60)
Glucose, Bld: 178 mg/dL — ABNORMAL HIGH (ref 70–99)
Phosphorus: 3.6 mg/dL (ref 2.5–4.6)
Potassium: 3.9 mmol/L (ref 3.5–5.1)
Sodium: 133 mmol/L — ABNORMAL LOW (ref 135–145)

## 2019-03-14 LAB — TROPONIN I (HIGH SENSITIVITY): Troponin I (High Sensitivity): 5 ng/L (ref <18)

## 2019-03-14 LAB — LACTIC ACID, PLASMA
Lactic Acid, Venous: 2.2 mmol/L (ref 0.5–1.9)
Lactic Acid, Venous: 2.3 mmol/L (ref 0.5–1.9)

## 2019-03-14 LAB — PROCALCITONIN: Procalcitonin: 0.14 ng/mL

## 2019-03-14 LAB — HEMOGLOBIN AND HEMATOCRIT, BLOOD
HCT: 31 % — ABNORMAL LOW (ref 36.0–46.0)
Hemoglobin: 9.4 g/dL — ABNORMAL LOW (ref 12.0–15.0)

## 2019-03-14 MED ORDER — GUAIFENESIN-DM 100-10 MG/5ML PO SYRP
15.0000 mL | ORAL_SOLUTION | ORAL | Status: DC | PRN
  Administered 2019-03-14 – 2019-03-16 (×2): 15 mL via ORAL
  Filled 2019-03-14 (×4): qty 15

## 2019-03-14 MED ORDER — ALPRAZOLAM 0.5 MG PO TABS: 0.5000 mg | ORAL_TABLET | Freq: Two times a day (BID) | ORAL | Status: DC | PRN

## 2019-03-14 MED ORDER — IPRATROPIUM-ALBUTEROL 0.5-2.5 (3) MG/3ML IN SOLN
RESPIRATORY_TRACT | Status: AC
  Filled 2019-03-14: qty 3

## 2019-03-14 MED ORDER — ALBUTEROL SULFATE HFA 108 (90 BASE) MCG/ACT IN AERS
1.0000 | INHALATION_SPRAY | Freq: Four times a day (QID) | RESPIRATORY_TRACT | Status: DC | PRN
  Filled 2019-03-14: qty 6.7

## 2019-03-14 MED ORDER — ALBUTEROL SULFATE (2.5 MG/3ML) 0.083% IN NEBU: 3.0000 mL | INHALATION_SOLUTION | RESPIRATORY_TRACT | Status: DC | PRN

## 2019-03-14 MED ORDER — METHYLPREDNISOLONE SODIUM SUCC 125 MG IJ SOLR
125.0000 mg | INTRAMUSCULAR | Status: AC
  Administered 2019-03-14: 125 mg via INTRAVENOUS
  Filled 2019-03-14: qty 2

## 2019-03-14 MED ORDER — SODIUM CHLORIDE 0.9 % IV SOLN
2.0000 g | Freq: Two times a day (BID) | INTRAVENOUS | Status: DC
  Administered 2019-03-14 – 2019-03-16 (×4): 2 g via INTRAVENOUS
  Filled 2019-03-14 (×4): qty 2

## 2019-03-14 MED ORDER — IPRATROPIUM-ALBUTEROL 0.5-2.5 (3) MG/3ML IN SOLN
3.0000 mL | Freq: Once | RESPIRATORY_TRACT | Status: AC
  Administered 2019-03-14: 3 mL via RESPIRATORY_TRACT

## 2019-03-14 MED ORDER — IPRATROPIUM-ALBUTEROL 0.5-2.5 (3) MG/3ML IN SOLN
3.0000 mL | Freq: Three times a day (TID) | RESPIRATORY_TRACT | Status: DC
  Administered 2019-03-14 – 2019-03-15 (×5): 3 mL via RESPIRATORY_TRACT
  Filled 2019-03-14 (×6): qty 3

## 2019-03-14 MED ORDER — IOHEXOL 350 MG/ML SOLN
75.0000 mL | Freq: Once | INTRAVENOUS | Status: AC | PRN
  Administered 2019-03-14: 75 mL via INTRAVENOUS

## 2019-03-14 MED ORDER — FERROUS SULFATE 325 (65 FE) MG PO TABS
325.0000 mg | ORAL_TABLET | Freq: Two times a day (BID) | ORAL | Status: DC
  Administered 2019-03-15 – 2019-03-16 (×3): 325 mg via ORAL
  Filled 2019-03-14 (×3): qty 1

## 2019-03-14 MED ORDER — VANCOMYCIN HCL 10 G IV SOLR
2000.0000 mg | Freq: Once | INTRAVENOUS | Status: AC
  Administered 2019-03-14: 2000 mg via INTRAVENOUS
  Filled 2019-03-14: qty 2000

## 2019-03-14 MED ORDER — SODIUM CHLORIDE 0.9 % IV SOLN
INTRAVENOUS | Status: DC
  Administered 2019-03-14 – 2019-03-16 (×4): via INTRAVENOUS

## 2019-03-14 MED ORDER — ALBUTEROL SULFATE (2.5 MG/3ML) 0.083% IN NEBU: 2.5000 mg | INHALATION_SOLUTION | RESPIRATORY_TRACT | Status: DC | PRN

## 2019-03-14 MED ORDER — IPRATROPIUM-ALBUTEROL 0.5-2.5 (3) MG/3ML IN SOLN: 3.0000 mL | Freq: Four times a day (QID) | RESPIRATORY_TRACT | Status: DC | PRN

## 2019-03-14 MED ORDER — IPRATROPIUM-ALBUTEROL 0.5-2.5 (3) MG/3ML IN SOLN
3.0000 mL | Freq: Four times a day (QID) | RESPIRATORY_TRACT | Status: DC
  Administered 2019-03-14: 3 mL via RESPIRATORY_TRACT

## 2019-03-14 MED ORDER — OXYCODONE HCL 5 MG PO TABS
2.5000 mg | ORAL_TABLET | ORAL | Status: DC | PRN
  Filled 2019-03-14: qty 1

## 2019-03-14 MED ORDER — OXYCODONE HCL 5 MG PO TABS
5.0000 mg | ORAL_TABLET | ORAL | Status: DC | PRN
  Administered 2019-03-14 – 2019-03-16 (×6): 5 mg via ORAL
  Filled 2019-03-14 (×6): qty 1

## 2019-03-14 MED ORDER — VANCOMYCIN HCL IN DEXTROSE 1-5 GM/200ML-% IV SOLN
1000.0000 mg | INTRAVENOUS | Status: DC
  Administered 2019-03-15: 1000 mg via INTRAVENOUS
  Filled 2019-03-14 (×2): qty 200

## 2019-03-14 MED ORDER — NALOXONE HCL 0.4 MG/ML IJ SOLN
INTRAMUSCULAR | Status: AC
  Administered 2019-03-14: 0.4 mg
  Filled 2019-03-14: qty 1

## 2019-03-14 NOTE — Significant Event (Signed)
Rapid Response Event Note  Overview: Time Called: 1110 Arrival Time: 1112 Event Type: Respiratory  Initial Focused Assessment: Patient sp lumbar fusion  Tuesday, she was up walking the halls yesterday.  Per staff she had a change this morning about 4am. She is sleepy Xanax yesterday 2001 Oxycodone IR 10mg : 9/23-24: 0448,  0749, 1209, 1554, 1957, 2359, 0418 Lung sounds with expiratory wheezes, upper airway and lungs. Desat on RA t0 82%  Upon my arrival sitting on the bsc, voiding 2 person assist to bed She is interactive and asking for coke and a warm blanket.  She does not feel good, and endorses weakness She complains of chest pain with inspiration.  Strong cough, non productive.  BP 151/74  HR 105  RR 12  O2 sat 94 on 2L [place]  Interventions: PCXR done prior to my arrival  "clear" cardiomegaly Albuterol treatment given Dr Zada Finders notified,  [facility] hospitalist consulted. Attempted IV start unsucessful IV team at bedside: Dr Tamala Julian at bedside to assess patient. Narcan given IV,  Patient more awake and conversant. Solumedrol given IV  Plan: transfer to progressive care          CTA chest - RO PE              Plan of Care (if not transferred):  Event Summary: Name of Physician Notified: Emelda Brothers at 1125    at    Outcome: Transferred (Comment)  Event End Time: [place]  Raliegh Ip

## 2019-03-14 NOTE — Progress Notes (Signed)
Pt respiratory status has changed. Pt's is SOB, wheezing, a non-productive cough, and has chest pain with coughing. Pt's O2 sat was 90% on RA. Pt was placed on 2L O2. Pt is very sleepy but does respond. Dr. Zada Finders called and is on his way to see the Pt. Will continue to monitor Pt. Holli Humbles, RN

## 2019-03-14 NOTE — Progress Notes (Signed)
Physical Therapy Treatment Patient Details Name: Julie Ross MRN: [id number] DOB: [birth year] Today's Date: 03/14/2019    History of Present Illness Pt is 58 yo female s/p lumbar interbody fusion of L4-5 and lumbar laminectomy due to BLE radicular pain, bilateral L4-5 synovial cysts with lateral recess stenosis and severe spondylosis. PMH including MS, breast cancer, hypothyroidism, HTN, bilateral TKAs, and sleep apnea.     PT Comments    Pt not progressing well with post-op mobility this date. She was able to demonstrate transfers with +2 assist mainly for safety, but overall pt lethargic and reporting general malaise. Pt was educated on precautions throughout functional mobility but session mostly focused on monitoring O2 sats (down to 82% on RA, 95% on 3L/min supplemental O2). RN present for assessment and called rapid response to assess as well. PT left pt in care of RN and rapid response team. Will continue to follow.    Follow Up Recommendations  No PT follow up;Supervision for mobility/OOB     Equipment Recommendations  None recommended by PT    Recommendations for Other Services       Precautions / Restrictions Precautions Precautions: Back;Fall Precaution Booklet Issued: Yes (comment) Precaution Comments: able to recall 3/3 precautions Required Braces or Orthoses: Other Brace Other Brace: No brace per MD order, brace in room and donned for comfort Restrictions Weight Bearing Restrictions: No    Mobility  Bed Mobility Overal bed mobility: Needs Assistance Bed Mobility: Rolling;Sit to Sidelying;Sidelying to Sit Rolling: Min assist Sidelying to sit: Min assist   Sit to supine: Min assist Sit to sidelying: Mod assist General bed mobility comments: Assist for transition to/from EOB. Increased assist to elevate LE's back up in bed at end of session.   Transfers Overall transfer level: Needs assistance Equipment used: Rolling walker (2 wheeled) Transfers: Sit to/from  [facility] Sit to Stand: Min assist;+2 safety/equipment Stand pivot transfers: Min assist;+2 safety/equipment       General transfer comment: Overall did not require significant assistance to transition to/from [facility], however +2 required for safety as pt lethargic and nauseated. Muiltimodal cues required for hand placement on seated surface for safety.   Ambulation/Gait             General Gait Details: Not able to progress to gait training this session.    Stairs             Wheelchair Mobility    Modified Rankin (Stroke Patients Only)       Balance Overall balance assessment: Needs assistance Sitting-balance support: No upper extremity supported;Feet supported;Single extremity supported Sitting balance-Leahy Scale: Poor Sitting balance - Comments: tactile cues to sit up right; cues to keep feet on floor and sit up right as pt leaning R during sitting Postural control: Right lateral lean Standing balance support: Bilateral upper extremity supported;During functional activity Standing balance-Leahy Scale: Poor Standing balance comment: reliant on BUE support                            Cognition Arousal/Alertness: Lethargic Behavior During Therapy: Flat affect Overall Cognitive Status: Impaired/Different from baseline Area of Impairment: Following commands;Problem solving                       Following Commands: Follows one step commands with increased time;Follows one step commands inconsistently     Problem Solving: Slow processing;Decreased initiation;Requires verbal cues;Requires tactile cues General Comments: pt required increased time  to follow commands, likely d/t nausea; eyes closed during most of session, slow to process; requires verbal tactile cues to initate movement      Exercises      General Comments        Pertinent Vitals/Pain Pain Assessment: Faces Faces Pain Scale: Hurts even more Pain Location:  back Pain Descriptors / Indicators: Aching;Discomfort;Grimacing;Operative site guarding;Sore;Moaning Pain Intervention(s): Limited activity within patient's tolerance;Monitored during session;Repositioned    Home Living                      Prior Function            PT Goals (current goals can now be found in the care plan section) Acute Rehab PT Goals Patient Stated Goal: Feel better PT Goal Formulation: With patient Time For Goal Achievement: 03/20/19 Potential to Achieve Goals: Good Progress towards PT goals: Progressing toward goals    Frequency    Min 5X/week      PT Plan Current plan remains appropriate    Co-evaluation PT/OT/SLP Co-Evaluation/Treatment: Yes Reason for Co-Treatment: Necessary to address cognition/behavior during functional activity;To address functional/ADL transfers;Complexity of the patient's impairments (multi-system involvement);For patient/therapist safety PT goals addressed during session: Mobility/safety with mobility;Proper use of DME;Balance        AM-PAC PT "6 Clicks" Mobility   Outcome Measure  Help needed turning from your back to your side while in a flat bed without using bedrails?: None Help needed moving from lying on your back to sitting on the side of a flat bed without using bedrails?: A Little Help needed moving to and from a bed to a chair (including a wheelchair)?: A Little Help needed standing up from a chair using your arms (e.g., wheelchair or bedside chair)?: A Little Help needed to walk in hospital room?: A Little Help needed climbing 3-5 steps with a railing? : A Little 6 Click Score: 19    End of Session Equipment Utilized During Treatment: Gait belt Activity Tolerance: Patient limited by fatigue;Patient limited by lethargy;Other (comment)(General feeling of malaise and nausea) Patient left: in bed;with call bell/phone within reach Nurse Communication: Mobility status PT Visit Diagnosis: Unsteadiness on  feet (R26.81);Pain Pain - part of body: (back)     Time: [id number] PT Time Calculation (min) (ACUTE ONLY): 28 min  Charges:  $Gait Training: 8-22 mins                     Rolinda Roan, PT, DPT Acute Rehabilitation Services Pager: [phone] Office: [phone]    Thelma Comp 03/14/2019, 1:30 PM

## 2019-03-14 NOTE — Progress Notes (Signed)
Pharmacy Antibiotic Note  Julie Ross is a 58 y.o. female admitted on 03/12/2019 with BLE radicular pain taken to OR on 9/22. Now with possible sepsis.  Pharmacy has been consulted for vancomycin and cefepime  Dosing.  Currently afebrile, wbc 13.5, scr 1.37.   Vancomycin 1g IV Q  hrs. Goal AUC 400-550. Expected AUC: 502  SCr used: 1.37   Plan: Vancomycin 2g now then 1g q24 hours Cefepime 2g q12 hours  Height: 5\' 6"  (167.6 cm) Weight: 230 lb (104.3 kg) IBW/kg (Calculated) : 59.3  Temp (24hrs), Avg:98.6 F (37 C), Min:98.1 F (36.7 C), Max:99.9 F (37.7 C)  Recent Labs  Lab 03/14/19 0840 03/14/19 1249 03/14/19 1528  WBC 13.5*  --   --   CREATININE 1.37*  --   --   LATICACIDVEN  --  2.2* 2.3*    Estimated Creatinine Clearance: 53.3 mL/min (A) (by C-G formula based on SCr of 1.37 mg/dL (H)).    Allergies  Allergen Reactions  . Bee Venom Anaphylaxis     Thank you for allowing pharmacy to be a part of this patient's care.  Erin Hearing PharmD., BCPS Clinical Pharmacist 03/14/2019 5:34 PM

## 2019-03-14 NOTE — Progress Notes (Signed)
Pt still having respiratory issues after breathing tx was given. Pt still very sleepy and wheezing has increased. O2 sat was 82% on RA. Rapid Response nurse was called to come assess the Pt. Dr. Zada Finders called and orders were given. Will continue to monitor Pt. Holli Humbles, RN

## 2019-03-14 NOTE — Progress Notes (Signed)
Occupational Therapy Treatment Patient Details Name: Julie Ross MRN: [id number] DOB: 11-Nov-1960 Today's Date: 03/14/2019    History of present illness Pt is 58 yo female s/p lumbar interbody fusion of L4-5 and lumbar laminectomy due to BLE radicular pain, bilateral L4-5 synovial cysts with lateral recess stenosis and severe spondylosis. PMH including MS, breast cancer, hypothyroidism, HTN, bilateral TKAs, and sleep apnea.    OT comments  Pt seen with PT for functional mobility progression. Pt lethargic and c/o nausea and not feeling good, increased pain but able to state 3/3 back precautions.  MIN A +2 for bed mobility to transition to EOB. Cues to carryover log roll technique MIN A to elevate trunk and progress BLEs off bed. MIN A +2 for safety to transfer from EOB>BSC with RW. Total A for standing yygiene; MIN A +2 for stand pivot transfer back to bed. RN and rapid response enter to assess pt. Will continue to follow and make DC recommendations as pt progress.    Follow Up Recommendations  Home health OT    Equipment Recommendations  None recommended by OT    Recommendations for Other Services      Precautions / Restrictions Precautions Precautions: Back;Fall Precaution Booklet Issued: Yes (comment) Precaution Comments: able to recall 3/3 precautions Required Braces or Orthoses: Other Brace Other Brace: No brace per MD order, brace in room and donned for comfort Restrictions Weight Bearing Restrictions: No       Mobility Bed Mobility Overal bed mobility: Needs Assistance Bed Mobility: Rolling;Sit to Sidelying;Sit to Supine Rolling: Supervision Sidelying to sit: Min assist   Sit to supine: Min assist   General bed mobility comments: requires verbal cues to carryover log roll technique; cues for sequencing;  MIN A to elevate trunk into sitting and scoot hips forward; MIN A to elevate BLEs back to bed  Transfers Overall transfer level: Needs assistance Equipment used:  Rolling walker (2 wheeled) Transfers: Sit to/from Stand Sit to Stand: Min assist;+2 safety/equipment         General transfer comment: MIN A +2 for safety d/y pt nausea    Balance Overall balance assessment: Needs assistance Sitting-balance support: No upper extremity supported;Feet supported;Single extremity supported Sitting balance-Leahy Scale: Poor Sitting balance - Comments: tactile cues to sit up right; cues to keep feet on floor and sit up right as pt leaning R during sitting Postural control: Right lateral lean Standing balance support: Bilateral upper extremity supported;During functional activity Standing balance-Leahy Scale: Poor Standing balance comment: reliant on BUE support                           ADL either performed or assessed with clinical judgement   ADL Overall ADL's : Needs assistance/impaired                         Toilet Transfer: Minimal assistance;+2 for safety/equipment;RW;BSC Toilet Transfer Details (indicate cue type and reason): MIN A +2 for safety; EOB>BSC with RW Toileting- Clothing Manipulation and Hygiene: Sit to/from stand;Total assistance Toileting - Clothing Manipulation Details (indicate cue type and reason): total A for pericare as pt very  nauseous     Functional mobility during ADLs: +2 for safety/equipment General ADL Comments: pt limited by nausea weakness stating she was going to be sick during session; MIN A for sit>stand and functional transfer     Vision Baseline Vision/History: Wears glasses Wears Glasses: At all times Patient Visual Report:  No change from baseline     Perception     Praxis      Cognition Arousal/Alertness: Lethargic Behavior During Therapy: Flat affect Overall Cognitive Status: Impaired/Different from baseline Area of Impairment: Following commands;Problem solving                       Following Commands: Follows one step commands with increased time;Follows one step  commands inconsistently     Problem Solving: Slow processing;Decreased initiation;Requires verbal cues;Requires tactile cues General Comments: pt required increased time to follow commands, likely d/t nausea; eyes closed during most of session, slow to process; requires verbal tactile cues to initate movement        Exercises     Shoulder Instructions       General Comments      Pertinent Vitals/ Pain       Pain Assessment: Faces Faces Pain Scale: Hurts even more Pain Location: back Pain Descriptors / Indicators: Aching;Discomfort;Grimacing;Operative site guarding;Sore;Moaning Pain Intervention(s): Limited activity within patient's tolerance;Monitored during session(limited by nausea)  Home Living                                          Prior Functioning/Environment              Frequency  Min 3X/week        Progress Toward Goals  OT Goals(current goals can now be found in the care plan section)  Progress towards OT goals: Not progressing toward goals - comment(limited session d/t lethargy and nausea)  Acute Rehab OT Goals Patient Stated Goal: stop hurting OT Goal Formulation: With patient Time For Goal Achievement: 03/27/19 Potential to Achieve Goals: Good  Plan      Co-evaluation                 AM-PAC OT "6 Clicks" Daily Activity     Outcome Measure   Help from another person eating meals?: None Help from another person taking care of personal grooming?: A Little Help from another person toileting, which includes using toliet, bedpan, or urinal?: Total Help from another person bathing (including washing, rinsing, drying)?: A Little Help from another person to put on and taking off regular upper body clothing?: None Help from another person to put on and taking off regular lower body clothing?: A Little 6 Click Score: 18    End of Session Equipment Utilized During Treatment: Rolling walker;Back brace  OT Visit Diagnosis:  Unsteadiness on feet (R26.81);Other abnormalities of gait and mobility (R26.89);Muscle weakness (generalized) (M62.81);Pain   Activity Tolerance Patient limited by lethargy;Other (comment)(nausea)   Patient Left in bed;with call bell/phone within reach;with nursing/sitter in room   Nurse Communication Mobility status        Time: [id number] OT Time Calculation (min): 28 min  Charges: OT General Charges $OT Visit: 1 Visit OT Treatments $Self Care/Home Management : 8-22 mins  [place], [place] [phone] [place] 03/14/2019, 12:36 PM

## 2019-03-14 NOTE — Progress Notes (Addendum)
Neurosurgery Service Progress Note  Subjective: No acute events overnight, feels "like garbage" this morning, wheezing with cough  Objective: Vitals:   03/14/19 0402 03/14/19 0542 03/14/19 0724 03/14/19 0736  BP: 129/67  (!) 153/78   Pulse: 70  80   Resp: 20 18 16    Temp: 98.2 F (36.8 C)  98.5 F (36.9 C)   TempSrc: Oral  Oral   SpO2: 100% 97% 98% 92%  Weight:      Height:       Temp (24hrs), Avg:98.1 F (36.7 C), Min:97.7 F (36.5 C), Max:98.5 F (36.9 C)  CBC Latest Ref Rng & Units 03/06/2019 04/02/2018 06/26/2017  WBC 4.0 - 10.5 K/uL 5.7 6.3 8.9  Hemoglobin 12.0 - 15.0 g/dL 11.1(L) 10.3(L) 11.2(L)  Hematocrit 36.0 - 46.0 % 34.0(L) 32.2(L) 34.1(L)  Platelets 150 - 400 K/uL 263 304.0 314   BMP Latest Ref Rng & Units 03/06/2019 04/02/2018 06/26/2017  Glucose 70 - 99 mg/dL 114(H) 153(H) 133(H)  BUN 6 - 20 mg/dL 20 21 19   Creatinine 0.44 - 1.00 mg/dL 1.21(H) 1.27(H) 1.29(H)  BUN/Creat Ratio 6 - 22 (calc) - - 15  Sodium 135 - 145 mmol/L 136 137 133(L)  Potassium 3.5 - 5.1 mmol/L 3.5 3.3(L) 3.5  Chloride 98 - 111 mmol/L 102 100 97(L)  CO2 22 - 32 mmol/L 22 27 24   Calcium 8.9 - 10.3 mg/dL 9.4 9.4 9.5   No intake or output data in the 24 hours ending 03/14/19 0804  Current Facility-Administered Medications:  .  0.9 %  sodium chloride infusion, 250 mL, Intravenous, Continuous, Makya Yurko A, MD .  acetaminophen (TYLENOL) tablet 650 mg, 650 mg, Oral, Q4H PRN, 650 mg at 03/13/19 2359 **OR** acetaminophen (TYLENOL) suppository 650 mg, 650 mg, Rectal, Q4H PRN, Niyati Heinke A, MD .  albuterol (VENTOLIN HFA) 108 (90 Base) MCG/ACT inhaler 1-2 puff, 1-2 puff, Inhalation, Q6H PRN, Judith Part, MD .  ALPRAZolam Duanne Moron) tablet 0.5 mg, 0.5 mg, Oral, TID PRN, Judith Part, MD, 0.5 mg at 03/13/19 2001 .  alum & mag hydroxide-simeth (MAALOX/MYLANTA) 200-200-20 MG/5ML suspension 30 mL, 30 mL, Oral, Q4H PRN, Judith Part, MD, 30 mL at 03/14/19 0542 .  amLODipine  (NORVASC) tablet 10 mg, 10 mg, Oral, Daily, Shields Pautz A, MD .  atorvastatin (LIPITOR) tablet 20 mg, 20 mg, Oral, q1800, Judith Part, MD, 20 mg at 03/13/19 1600 .  baclofen (LIORESAL) tablet 10 mg, 10 mg, Oral, TID PRN, Judith Part, MD, 10 mg at 03/13/19 2359 .  buPROPion (WELLBUTRIN XL) 24 hr tablet 150 mg, 150 mg, Oral, Daily, Briannia Laba A, MD, 150 mg at 03/13/19 1210 .  docusate sodium (COLACE) capsule 100 mg, 100 mg, Oral, BID, Judith Part, MD, 100 mg at 03/13/19 1956 .  gabapentin (NEURONTIN) tablet 600 mg, 600 mg, Oral, BID, Judith Part, MD, 600 mg at 03/13/19 1957 .  guaiFENesin-dextromethorphan (ROBITUSSIN DM) 100-10 MG/5ML syrup 15 mL, 15 mL, Oral, Q4H PRN, Judith Part, MD, 15 mL at 03/14/19 0418 .  losartan (COZAAR) tablet 100 mg, 100 mg, Oral, Daily **AND** hydrochlorothiazide (HYDRODIURIL) tablet 25 mg, 25 mg, Oral, Daily, Bodhi Stenglein A, MD .  HYDROmorphone (DILAUDID) injection 0.5 mg, 0.5 mg, Intravenous, Q3H PRN, Cavin Longman A, MD .  ipratropium-albuterol (DUONEB) 0.5-2.5 (3) MG/3ML nebulizer solution 3 mL, 3 mL, Nebulization, TID, Trica Usery, Joyice Faster, MD, 3 mL at 03/14/19 0736 .  letrozole [facility]) tablet 2.5 mg, 2.5 mg, Oral, Daily, Adora Yeh, Joyice Faster,  MD, 2.5 mg at 03/13/19 1204 .  levothyroxine (SYNTHROID) tablet 88 mcg, 88 mcg, Oral, QAC breakfast, Judith Part, MD, 88 mcg at 03/14/19 0454 .  menthol-cetylpyridinium (CEPACOL) lozenge 3 mg, 1 lozenge, Oral, PRN **OR** phenol (CHLORASEPTIC) mouth spray 1 spray, 1 spray, Mouth/Throat, PRN, Kern Gingras A, MD .  ondansetron (ZOFRAN) tablet 4 mg, 4 mg, Oral, Q6H PRN **OR** ondansetron (ZOFRAN) injection 4 mg, 4 mg, Intravenous, Q6H PRN, Attila Mccarthy A, MD .  oxyCODONE (Oxy IR/ROXICODONE) immediate release tablet 10 mg, 10 mg, Oral, Q4H PRN, Judith Part, MD, 10 mg at 03/14/19 0418 .  oxyCODONE (Oxy IR/ROXICODONE) immediate release tablet 5 mg, 5  mg, Oral, Q4H PRN, Judith Part, MD, 5 mg at 03/13/19 0046 .  pantoprazole (PROTONIX) EC tablet 40 mg, 40 mg, Oral, Daily, Rodolfo Notaro, Joyice Faster, MD, 40 mg at 03/13/19 1210 .  polyethylene glycol (MIRALAX / GLYCOLAX) packet 17 g, 17 g, Oral, Daily PRN, Blakeley Margraf A, MD .  potassium chloride SA (K-DUR) CR tablet 20 mEq, 20 mEq, Oral, Daily, Acacia Latorre, Joyice Faster, MD, 20 mEq at 03/13/19 1209 .  sodium chloride flush (NS) 0.9 % injection 3 mL, 3 mL, Intravenous, Q12H, Laterrica Libman A, MD .  sodium chloride flush (NS) 0.9 % injection 3 mL, 3 mL, Intravenous, PRN, Judith Part, MD   Physical Exam: AOx3, PERRL, EOMI, FS, Strength 5/5 x4, SILTx4 Incision c/d/i Audible expiratory wheezing  Assessment & Plan: 58 y.o. woman s/p open L4-5 TLIF, recovering well, post-op worsening SOB  -advance activity as tolerated -PT/OT rec home health OT, no PT f/u  -CXR, EKG, screening labs & troponin -RT to evaluate, already had neb x1, denies h/o lung disease -SCDs/TEDs, Jackey Loge  03/14/19 8:04 AM

## 2019-03-14 NOTE — Progress Notes (Signed)
Critical Lab called to Dr. Tamala Julian. Lactic Acid 2.2. Orders given by Dr. Tamala Julian. Holli Humbles, RN

## 2019-03-14 NOTE — Consult Note (Addendum)
Medical Consultation   Julie Ross  [id number]  DOB: March 02, 1961  DOA: 03/12/2019  PCP: Luetta Nutting, DO     Requesting physician: Dr. Zada Finders  Reason for consultation: Respiratory distress  History of Present Illness: Julie Ross is an 59 y.o. female pmh significant for hypertension, hypothyroidism, MS, history of breast cancer, anxiety, depression, anemia, and OSA not on CPAP; who presented with complaints of bilateral lower extremity radicular pain.  MRI showing L4-5 cyst with lateral recess stenosis and severe spondylolysis.  Patient underwent L4 laminectomy with left L4-L5 open transforaminal lumbar interbody fusion with bilateral L4-L5 pedicle screw placement with robotic guidance on 9/22.  Following procedure patient was placed on SCDs and had been receiving pain medication for symptoms.  She was getting ready for discharge and had been seen walking the halls yesterday.  However, acutely developed worsening shortness of breath with wheezing starting around 4 AM this morning.  Patient appears to have been receiving pain of oxycodone every 4 hours.  O2 saturations reportedly in the 82% on room air.  Patient was given multiple breathing treatments without improvement in symptoms.  Chest x-ray revealed cardiomegaly without signs of edema or infiltrate.  Rapid response called due to worsening respiratory distress.  Patient was given 0.4 mg of Narcan with some improvement in alertness other associated symptoms include pleuritic-like chest pain and nonproductive cough.  Denies any recent leg swelling, calf pain, or history of heart failure.       Review of Systems:  Review of Systems  Constitutional: Positive for chills.  Eyes: Negative for double vision and photophobia.  Respiratory: Positive for cough and shortness of breath. Negative for sputum production.   Cardiovascular: Negative for leg swelling.  Gastrointestinal: Positive for nausea. Negative for  abdominal pain.  Genitourinary: Negative for dysuria.   As per HPI otherwise 10 point review of systems negative.     Past Medical History: Past Medical History:  Diagnosis Date  . Anemia    h/o - ? bld. transfusion - more recent- ?2013  . Anxiety    uses xanax mainly for sleep  . Arthritis    knees   . Breast cancer ([place]) 11/2011   lul/ER+PR+, x1 lymph node   . Cancer ([facility])    squamous cell skin cancer x7  . Depression   . GERD (gastroesophageal reflux disease)   . H/O bone density study 03/2011  . H/O colonoscopy   . H/O echocardiogram    last one 02/2013, due to chemotherapy  . Heart burn   . Hx of radiation therapy 04/17/12- 06/04/12   left chest wall, axilla, L supraclavicular fossa 4500 cGy, left mastectomy scar/chest wall 5940 cGy  . Hypertension   . Hypothyroidism   . Multiple sclerosis ([place])   . Neuromuscular disorder ([place])    MS TX WITH CYMBALTA   . Personal history of chemotherapy   . Personal history of radiation therapy   . Sleep apnea    MILD SLEEP APNEA, NO (Needed) MACHINE  6 YRS AGO HPT [facility]   . Wears glasses     Past Surgical History: Past Surgical History:  Procedure Laterality Date  . CESAREAN SECTION     X2   . COLONOSCOPY    . ESOPHAGOGASTRODUODENOSCOPY    . FLEXIBLE SIGMOIDOSCOPY    . I&D KNEE WITH POLY EXCHANGE  03/02/2012   Procedure: IRRIGATION AND DEBRIDEMENT KNEE WITH POLY EXCHANGE;  Surgeon:  Alta Corning, MD;  Location: [facility];  Service: Orthopedics;  Laterality: Left;  I&D of total knee with Possible Poly Exchange   . JOINT REPLACEMENT  Feb 2013   left knee- multiple surgeries   . KNEE ARTHROSCOPY     LT KNEE 04/2011  . KNEE ARTHROSCOPY  84   rt  . MASTECTOMY    . MASTECTOMY W/ SENTINEL NODE BIOPSY  12/19/2011   Procedure: MASTECTOMY WITH SENTINEL LYMPH NODE BIOPSY;  Surgeon: Haywood Lasso, MD;  Location: [facility];  Service: General;  Laterality: Left;  left breast and left axilla   . MOHS SURGERY     right face  . NO PAST  SURGERIES     BLADDER SLING  4-5 YRS AGO   . PORT-A-CATH REMOVAL  03/06/2012   Procedure: REMOVAL PORT-A-CATH;  Surgeon: Odis Hollingshead, MD;  Location: [facility];  Service: General;  Laterality: N/A;  . PORT-A-CATH REMOVAL Right 08/22/2012   Procedure: REMOVAL PORT-A-CATH;  Surgeon: Edward Jolly, MD;  Location: [facility];  Service: General;  Laterality: Right;  . PORTACATH PLACEMENT  01/16/2012   Procedure: INSERTION PORT-A-CATH;  Surgeon: Haywood Lasso, MD;  Location: [facility];  Service: General;  Laterality: Right;  [place] Cath Placement   . PORTACATH PLACEMENT  05/01/2012   Procedure: INSERTION PORT-A-CATH;  Surgeon: Haywood Lasso, MD;  Location: [facility];  Service: General;  Laterality: Right;  right internal jugular port-a-cath insertion  . TEE WITHOUT CARDIOVERSION  03/06/2012   Procedure: TRANSESOPHAGEAL ECHOCARDIOGRAM (TEE);  Surgeon: Josue Hector, MD;  Location: [facility];  Service: Cardiovascular;  Laterality: N/A;  Rm. [id number]  . TONSILLECTOMY    . TOTAL KNEE ARTHROPLASTY  08/15/2011   Procedure: TOTAL KNEE ARTHROPLASTY; lft Surgeon: Alta Corning, MD;  Location: [facility];  Service: Orthopedics;  Laterality: Left;  RIGHT KNEE CORTIZONE INJECTION  . TOTAL KNEE ARTHROPLASTY Left 08/18/2012   Procedure:  Irrigation and debridement of LEFT total knee;  Removal of total knee parts; Implant of spacers;  Surgeon: Kerin Salen, MD;  Location: [facility];  Service: Orthopedics;  Laterality: Left;  . TOTAL KNEE ARTHROPLASTY Right 08/12/2013  . TOTAL KNEE ARTHROPLASTY Right 08/12/2013   Procedure: RIGHT TOTAL KNEE ARTHROPLASTY;  Surgeon: Kerin Salen, MD;  Location: [facility];  Service: Orthopedics;  Laterality: Right;  . TOTAL KNEE REVISION Left 04/19/2013   Procedure: TOTAL KNEE REVISION AND REMOVAL CEMENT SPACER;  Surgeon: Kerin Salen, MD;  Location: [facility];  Service: Orthopedics;  Laterality: Left;  . TUMOR REMOVAL     FROM [place] AGE 85     Allergies:    Allergies  Allergen Reactions  . Bee Venom Anaphylaxis     Social History:  reports that she has never smoked. She has never used smokeless tobacco. She reports that she does not drink alcohol or use drugs.   Family History: Family History  Problem Relation Age of Onset  . Breast cancer Paternal Aunt        dx in her 40s  . Heart disease Maternal Grandfather   . Lung cancer Maternal Uncle        smoker  . Breast cancer Paternal Grandmother        dx in her 8s  . Ovarian cancer Paternal Aunt        dx in her 29s  . Arthritis Mother   . Hypertension Mother   . Heart disease Mother  CHF  . Dementia Father   . Diabetes type II Father     Physical Exam: Vitals:   03/14/19 0542 03/14/19 0724 03/14/19 0736 03/14/19 1115  BP:  (!) 153/78    Pulse:  80    Resp: 18 16    Temp:  98.5 F (36.9 C)    TempSrc:  Oral    SpO2: 97% 98% 92% 94%  Weight:      Height:        Constitutional: Older female who appears to be very lethargic but arousable to verbal stimuli.  In some respiratory distress. Eyes: PERLA, EOMI, irises appear normal, anicteric sclera,  ENMT: external ears and nose appear normal Lips appears normal, oropharynx mucosa, tongue, posterior pharynx appear normal  Neck: neck appears normal, no masses, normal ROM, no thyromegaly, mild JVD noted. CVS: S1-S2 clear, no murmur rubs or gallops, no LE edema, normal pedal pulses  Respiratory: Bilateral wheezes noted in both lung fields and rhonchi appreciated in the bases.  Patient currently on 2 L nasal cannula oxygen maintaining O2 saturations. Abdomen: soft nontender, nondistended, normal bowel sounds, no hepatosplenomegaly, no hernias  Musculoskeletal: : no cyanosis, clubbing or edema noted bilaterally Neuro: Cranial nerves II-XII intact, strength, sensation, reflexes Psych:stable mood and affect, mental status improved after receiving Narcan. Skin: All surgical wounds appear to be healing  Data reviewed:  I  have personally reviewed following labs and imaging studies Labs:  CBC: Recent Labs  Lab 03/14/19 0840  WBC 13.5*  HGB 9.5*  HCT 30.9*  MCV 76.5*  PLT [id number]    Basic Metabolic Panel: Recent Labs  Lab 03/14/19 0840  NA 133*  K 3.9  CL 97*  CO2 21*  GLUCOSE 178*  BUN 25*  CREATININE 1.37*  CALCIUM 8.6*  PHOS 3.6   GFR Estimated Creatinine Clearance: 53.3 mL/min (A) (by C-G formula based on SCr of 1.37 mg/dL (H)). Liver Function Tests: Recent Labs  Lab 03/14/19 0840  ALBUMIN 3.8   No results for input(s): LIPASE, AMYLASE in the last 168 hours. No results for input(s): AMMONIA in the last 168 hours. Coagulation profile No results for input(s): INR, PROTIME in the last 168 hours.  Cardiac Enzymes: No results for input(s): CKTOTAL, CKMB, CKMBINDEX, TROPONINI in the last 168 hours. BNP: Invalid input(s): POCBNP CBG: No results for input(s): GLUCAP in the last 168 hours. D-Dimer No results for input(s): DDIMER in the last 72 hours. Hgb A1c No results for input(s): HGBA1C in the last 72 hours. Lipid Profile No results for input(s): CHOL, HDL, LDLCALC, TRIG, CHOLHDL, LDLDIRECT in the last 72 hours. Thyroid function studies No results for input(s): TSH, T4TOTAL, T3FREE, THYROIDAB in the last 72 hours.  Invalid input(s): FREET3 Anemia work up No results for input(s): VITAMINB12, FOLATE, FERRITIN, TIBC, IRON, RETICCTPCT in the last 72 hours. Urinalysis    Component Value Date/Time   COLORURINE YELLOW 08/05/2013 1209   APPEARANCEUR CLEAR 08/05/2013 1209   LABSPEC 1.014 08/05/2013 1209   PHURINE 5.5 08/05/2013 1209   GLUCOSEU NEGATIVE 08/05/2013 1209   HGBUR NEGATIVE 08/05/2013 1209   BILIRUBINUR NEGATIVE 08/05/2013 1209   KETONESUR NEGATIVE 08/05/2013 1209   PROTEINUR NEGATIVE 08/05/2013 1209   UROBILINOGEN 0.2 08/05/2013 1209   NITRITE NEGATIVE 08/05/2013 1209   LEUKOCYTESUR SMALL (A) 08/05/2013 1209     Microbiology Recent Results (from the past 240  hour(s))  Surgical pcr screen     Status: Abnormal   Collection Time: 03/06/19 11:20 AM   Specimen: Nasal Mucosa; Nasal  Swab  Result Value Ref Range Status   MRSA, PCR NEGATIVE NEGATIVE Final   Staphylococcus aureus POSITIVE (A) NEGATIVE Final    Comment: (NOTE) The Xpert SA Assay (FDA approved for NASAL specimens in patients 56 years of age and older), is one component of a comprehensive surveillance program. It is not intended to diagnose infection nor to guide or monitor treatment. Performed at [facility], [place] [address], [place], [place] [address]   Novel Coronavirus, NAA (Hosp order, Send-out to Ref Lab; TAT 18-24 hrs     Status: None   Collection Time: 03/08/19 11:51 AM   Specimen: Nasopharyngeal Swab; Respiratory  Result Value Ref Range Status   SARS-CoV-2, NAA NOT DETECTED NOT DETECTED Final    Comment: (NOTE) This nucleic acid amplification test was developed and its performance characteristics determined by [employer]. Nucleic acid amplification tests include PCR and TMA. This test has not been FDA cleared or approved. This test has been authorized by FDA under an Emergency Use Authorization (EUA). This test is only authorized for the duration of time the declaration that circumstances exist justifying the authorization of the emergency use of in vitro diagnostic tests for detection of SARS-CoV-2 virus and/or diagnosis of COVID-19 infection under section 564(b)(1) of the Act, 21 U.S.C. [id number]) (1), unless the authorization is terminated or revoked sooner. When diagnostic testing is negative, the possibility of a false negative result should be considered in the context of a patient's recent exposures and the presence of clinical signs and symptoms consistent with COVID-19. An individual without symptoms of COVID- 19 and who is not shedding SARS-CoV-2 vi rus would expect to have a negative (not detected) result in this assay. Performed At: [facility] [address] [place], [place] [id number] Rush Farmer MD [id number]    [place]  Final    Comment: Performed at [facility], [place] [address] [place],  [address]       Inpatient Medications:   Scheduled Meds: . amLODipine  10 mg Oral Daily  . atorvastatin  20 mg Oral q1800  . buPROPion  150 mg Oral Daily  . docusate sodium  100 mg Oral BID  . gabapentin  600 mg Oral BID  . losartan  100 mg Oral Daily   And  . hydrochlorothiazide  25 mg Oral Daily  . ipratropium-albuterol  3 mL Nebulization TID  . letrozole  2.5 mg Oral Daily  . levothyroxine  88 mcg Oral QAC breakfast  . pantoprazole  40 mg Oral Daily  . potassium chloride SA  20 mEq Oral Daily  . sodium chloride flush  3 mL Intravenous Q12H   Continuous Infusions: . sodium chloride       Radiological Exams on Admission: Dg Lumbar Spine 2-3 Views  Result Date: 03/12/2019 CLINICAL DATA:  58 year old female with a history of lumbar spine surgery EXAM: DG C-ARM 1-60 MIN; LUMBAR SPINE - 2-3 VIEW COMPARISON:  None. FINDINGS: Limited fluoroscopic spot images of the lumbar spine demonstrates posterior lumbar interbody fusion with bilateral pedicle screw and rod fixation of the L4-L5 level with disc spacer centered at the L4-L5 disc space. IMPRESSION: Limited intraoperative fluoroscopic spot images of posterior lumbar fusion of L4-L5, as above. Please refer to the dictated operative report for full details of intraoperative findings and procedure. Electronically Signed   By: Corrie Mckusick D.O.   On: 03/12/2019 12:34   Dg Chest Port 1 View  Result Date: 03/14/2019 CLINICAL DATA:  Cough,  congestion, shortness of breath EXAM: PORTABLE CHEST 1 VIEW COMPARISON:  10/20/2014 FINDINGS: Cardiomegaly. Both lungs are clear. The visualized skeletal structures are unremarkable. IMPRESSION: Cardiomegaly without acute abnormality of the lungs in AP portable projection. Electronically  Signed   By: Eddie Candle M.D.   On: 03/14/2019 11:08   Dg C-arm 1-60 Min  Result Date: 03/12/2019 CLINICAL DATA:  58 year old female with a history of lumbar spine surgery EXAM: DG C-ARM 1-60 MIN; LUMBAR SPINE - 2-3 VIEW COMPARISON:  None. FINDINGS: Limited fluoroscopic spot images of the lumbar spine demonstrates posterior lumbar interbody fusion with bilateral pedicle screw and rod fixation of the L4-L5 level with disc spacer centered at the L4-L5 disc space. IMPRESSION: Limited intraoperative fluoroscopic spot images of posterior lumbar fusion of L4-L5, as above. Please refer to the dictated operative report for full details of intraoperative findings and procedure. Electronically Signed   By: Corrie Mckusick D.O.   On: 03/12/2019 12:34    Impression/Recommendations  Lumbar radiculopathy: Patient postop day 2 from L4 laminectomy with left L4-L5 open transforaminal lumbar interbody fusion with bilateral L4-L5 pedicle screw placement with robotic guidance on 9/22 [place]. -Decreased pain medication regimen -Per neurosurgery  Acute respiratory failure with hypoxia: Acute.  Patient acutely reported having shortness of breath with pleuritic pain this morning at 4 AM.  Chest x-ray did not show any acute abnormalities.  Patient noted to be tachycardic and hypoxic with O2 saturations as low as 82% on room air.  Question possibility of PE as patient had been on SCDs versus aspiration/pneumonia. -Continuous pulse oximetry with nasal cannula oxygen as needed -Aspiration precaution -Continue incentive spirometry -DuoNebs 3 times daily and as needed as needed -Solu-Medrol 125 mg IV x1 dose now -Would limit sedating medication -Check CT angiogram of the chest to rule out PE(CT angiogram of the chest negative for any signs of a PE, but did note bilateral lower lobe opacities concerning for atelectasis versus pneumonia)  SIRS/sepsis: Acute.  Patient found to be tachycardic, WBC 13.5, and lactic acid  elevated up to 2.2. -Check sputum and blood cultures -Empiric antibiotics of vancomycin and cefepime -Trend lactic acid levels -Check pro-calcitonin  Microcytic anemia: Acute on chronic.  Hemoglobin 9.5, but baseline prior to surgery appeared to be around 11.  Suspect acute loss related with recent surgery. -Check iron studies in a.m. -Repeat H&H now -Start ferrous sulfate twice daily with meals -May benefit from iron infusion  Essential hypertension: Blood pressures appear stable.  At home patient on losartan-hydrochlorothiazide and amlodipine. -Continue current home regimen  Anxiety -Continue Wellbutrin and changed Ativan 0.5 mg as needed twice daily  Hypothyroidism -Check TSH -Continue levothyroxine  Hyperlipidemia -Continue atorvastatin  GERD -Continue Prilosec   Thank you for this consultation.  Our [facility] hospitalist team will follow the patient with you.   Time Spent: 30 minutes  Norval Morton M.D. [facility] 03/14/2019, 11:46 AM

## 2019-03-14 NOTE — Progress Notes (Signed)
Report called to Nira Conn, RN on 3W. Pt transported by bed to 3W04. Holli Humbles, RN

## 2019-03-15 LAB — EXPECTORATED SPUTUM ASSESSMENT W GRAM STAIN, RFLX TO RESP C

## 2019-03-15 LAB — IRON AND TIBC
Iron: 8 ug/dL — ABNORMAL LOW (ref 28–170)
Saturation Ratios: 2 % — ABNORMAL LOW (ref 10.4–31.8)
TIBC: 353 ug/dL (ref 250–450)
UIBC: 345 ug/dL

## 2019-03-15 LAB — TSH: TSH: 0.624 u[IU]/mL (ref 0.350–4.500)

## 2019-03-15 LAB — FERRITIN: Ferritin: 31 ng/mL (ref 11–307)

## 2019-03-15 NOTE — Progress Notes (Signed)
Physical Therapy Treatment Patient Details Name: Julie Ross MRN: [id number] DOB: 06-15-1961 Today's Date: 03/15/2019    History of Present Illness Pt is 58 yo female s/p lumbar interbody fusion of L4-5 and lumbar laminectomy due to BLE radicular pain, bilateral L4-5 synovial cysts with lateral recess stenosis and severe spondylosis. PMH including MS, breast cancer, hypothyroidism, HTN, bilateral TKAs, and sleep apnea.     PT Comments    Patient seen for mobility progression. Pt requires min guard assist for OOB mobility this session. SpO2 90% or > on RA throughout session although pt SOB while ambulating. Pt tolerated gait/stair training well. Continue to progress as tolerated.    Follow Up Recommendations  No PT follow up;Supervision for mobility/OOB     Equipment Recommendations  None recommended by PT    Recommendations for Other Services       Precautions / Restrictions Precautions Precautions: Back;Fall Precaution Comments: able to recall 3/3 precautions Required Braces or Orthoses: Other Brace Other Brace: No brace per MD order, brace in room and donned for comfort Restrictions Weight Bearing Restrictions: No    Mobility  Bed Mobility Overal bed mobility: Needs Assistance Bed Mobility: Rolling;Sidelying to Sit Rolling: Supervision Sidelying to sit: Supervision       General bed mobility comments: supervision for safety; good log roll technique without cues; use of rail  Transfers Overall transfer level: Needs assistance Equipment used: Rolling walker (2 wheeled) Transfers: Sit to/from Stand Sit to Stand: Min guard         General transfer comment: min guard for safety  Ambulation/Gait Ambulation/Gait assistance: Min guard Gait Distance (Feet): 200 Feet Assistive device: Rolling walker (2 wheeled) Gait Pattern/deviations: Step-through pattern;Decreased stride length;Trunk flexed Gait velocity: Decreased   General Gait Details: cues for upright  posture; decreased cadence but steady gait   Stairs Stairs: Yes Stairs assistance: Min guard Stair Management: One rail Right;Step to pattern;Forwards;With cane Number of Stairs: 2 General stair comments: cues for sequencing and technique   Wheelchair Mobility    Modified Rankin (Stroke Patients Only)       Balance Overall balance assessment: Needs assistance Sitting-balance support: No upper extremity supported;Feet supported Sitting balance-Leahy Scale: Good     Standing balance support: Bilateral upper extremity supported;During functional activity Standing balance-Leahy Scale: Poor                              Cognition Arousal/Alertness: Awake/alert Behavior During Therapy: WFL for tasks assessed/performed Overall Cognitive Status: Within Functional Limits for tasks assessed                                        Exercises      General Comments General comments (skin integrity, edema, etc.): SpO2 90% or > on RA       Pertinent Vitals/Pain Pain Assessment: 0-10 Pain Score: 5  Pain Location: back Pain Descriptors / Indicators: Sore;Guarding Pain Intervention(s): Limited activity within patient's tolerance;Monitored during session;Repositioned    Home Living                      Prior Function            PT Goals (current goals can now be found in the care plan section) Progress towards PT goals: Progressing toward goals    Frequency    Min 5X/week  PT Plan Current plan remains appropriate    Co-evaluation              AM-PAC PT "6 Clicks" Mobility   Outcome Measure  Help needed turning from your back to your side while in a flat bed without using bedrails?: None Help needed moving from lying on your back to sitting on the side of a flat bed without using bedrails?: A Little Help needed moving to and from a bed to a chair (including a wheelchair)?: A Little Help needed standing up from a chair  using your arms (e.g., wheelchair or bedside chair)?: A Little Help needed to walk in hospital room?: A Little Help needed climbing 3-5 steps with a railing? : A Little 6 Click Score: 19    End of Session Equipment Utilized During Treatment: Gait belt Activity Tolerance: Patient tolerated treatment well Patient left: with call bell/phone within reach;in chair;with chair alarm set Nurse Communication: Mobility status PT Visit Diagnosis: Unsteadiness on feet (R26.81);Pain Pain - part of body: (back)     Time: [id number] PT Time Calculation (min) (ACUTE ONLY): 31 min  Charges:  $Gait Training: 23-37 mins                     Earney Navy, PTA Acute Rehabilitation Services Pager: [phone] Office: [phone]     Darliss Cheney 03/15/2019, 1:25 PM

## 2019-03-15 NOTE — Progress Notes (Signed)
PROGRESS NOTE    FRANKI SKOCZYLAS  [id number] DOB: 01-Dec-1960 DOA: 03/12/2019 PCP: Luetta Nutting, DO    Brief Narrative:  58 year old female with history of hypertension, hypothyroidism, multiple sclerosis, history of breast cancer, anxiety depression and anemia, obstructive sleep apnea not on CPAP who was admitted to the hospital with bilateral lower extremity radicular pain.  MRI showed L4-5 cyst with lateral recess stenosis and severe  Spondylolysis.  Patient underwent L4 laminectomy with left L4 and 5 open transforaminal lumbar interbody fusion on 9/22.  Early morning hours of 924, patient started having cough and shortness of breath and she was 82% on room air.  Patient was given multiple breathing treatments without improvement in symptoms.  Chest x-ray revealed cardiomegaly without sounds of edema or infiltrate.  Medicine consulted for hypoxia and shortness of breath.   Assessment & Plan:   Active Problems:   Lumbar radiculopathy  Acute hypoxic respiratory failure in a postop patient with underlying history of sleep apnea untreated. CT scan with evidence of bilateral lower lobe opacities.  Most likely atelectasis.  Leukocytosis present.  Treating as healthcare associated pneumonia. Clinically improving.  Reasonable to continue vancomycin and cefepime for 48 hours given severity of symptoms.  MRSA swab was positive.  Patient also has a history of prosthetic infection. Continue aggressive chest physiotherapy, incentive spirometry, deep breathing exercises, sputum induction, mucolytic's and bronchodilators. Sputum cultures, blood cultures are pending so far negative. Supplemental oxygen to keep saturations more than 90%. She is mostly on room air now. Advance activities.  Ambulate. If patient does adequate clinical improvement, should be able to change her to oral antibiotic by tomorrow morning. I will recheck her CBC and BMP in the morning.  Hypertension: Blood pressures are stable.   Continue losartan hydrochlorothiazide and amlodipine.  Anxiety: Continue Wellbutrin and Ativan as needed.  Hypothyroidism: Stable.  Lumbar radiculopathy status post surgery: Surgically stable as per surgery.  Thank you for involving [place] in Ms. Haapala care.  We will continue to follow until she is in the hospital. Anticipating will change to oral antibiotics for 1 week by tomorrow morning.   DVT prophylaxis: SCDs Code Status: Full code Family Communication: Husband at the bedside Disposition Plan: Anticipate home.  Anticipate tomorrow   Procedures:   Lumbar surgery, 923  Antimicrobials:   Vancomycin, 03/14/2019  Cefepime, 03/14/2019   Subjective: Patient seen and examined.  No overnight events.  Still has some cough.  She has no sputum production.  Afebrile overnight.  Not used oxygen since last night.  Objective: Vitals:   03/15/19 0310 03/15/19 0721 03/15/19 0842 03/15/19 1110  BP: 118/63  (!) 136/57 129/64  Pulse: 89  86 72  Resp: 18  20 16   Temp: 98.7 F (37.1 C)  98.3 F (36.8 C) 98.5 F (36.9 C)  TempSrc: Oral  Oral Oral  SpO2: 97% 92% 94% 91%  Weight:      Height:        Intake/Output Summary (Last 24 hours) at 03/15/2019 1426 Last data filed at 03/15/2019 0844 Gross per 24 hour  Intake 2017.43 ml  Output --  Net 2017.43 ml   Filed Weights   03/12/19 0621  Weight: 104.3 kg    Examination:  General exam: Appears calm and comfortable, on room air. Respiratory system: Clear to auscultation. Respiratory effort normal.  No added sound. Cardiovascular system: S1 & S2 heard, RRR. No JVD, murmurs, rubs, gallops or clicks. No pedal edema. Gastrointestinal system: Abdomen is nondistended, soft and nontender. No organomegaly  or masses felt. Normal bowel sounds heard. Central nervous system: Alert and oriented. No focal neurological deficits. Extremities: Symmetric 5 x 5 power.  Surgical scars clean and dry. Skin: No rashes, lesions or ulcers Psychiatry:  Judgement and insight appear normal. Mood & affect appropriate.     Data Reviewed: I have personally reviewed following labs and imaging studies  CBC: Recent Labs  Lab 03/14/19 0840 03/14/19 1229  WBC 13.5*  --   HGB 9.5* 9.4*  HCT 30.9* 31.0*  MCV 76.5*  --   PLT 264  --    Basic Metabolic Panel: Recent Labs  Lab 03/14/19 0840  NA 133*  K 3.9  CL 97*  CO2 21*  GLUCOSE 178*  BUN 25*  CREATININE 1.37*  CALCIUM 8.6*  PHOS 3.6   GFR: Estimated Creatinine Clearance: 53.3 mL/min (A) (by C-G formula based on SCr of 1.37 mg/dL (H)). Liver Function Tests: Recent Labs  Lab 03/14/19 0840  ALBUMIN 3.8   No results for input(s): LIPASE, AMYLASE in the last 168 hours. No results for input(s): AMMONIA in the last 168 hours. Coagulation Profile: No results for input(s): INR, PROTIME in the last 168 hours. Cardiac Enzymes: No results for input(s): CKTOTAL, CKMB, CKMBINDEX, TROPONINI in the last 168 hours. BNP (last 3 results) No results for input(s): PROBNP in the last 8760 hours. HbA1C: No results for input(s): HGBA1C in the last 72 hours. CBG: No results for input(s): GLUCAP in the last 168 hours. Lipid Profile: No results for input(s): CHOL, HDL, LDLCALC, TRIG, CHOLHDL, LDLDIRECT in the last 72 hours. Thyroid Function Tests: Recent Labs    03/15/19 0358  TSH 0.624   Anemia Panel: Recent Labs    03/15/19 0358  FERRITIN 31  TIBC 353  IRON 8*   Sepsis Labs: Recent Labs  Lab 03/14/19 1249 03/14/19 1528 03/14/19 1757  PROCALCITON  --   --  0.14  LATICACIDVEN 2.2* 2.3*  --     Recent Results (from the past 240 hour(s))  Surgical pcr screen     Status: Abnormal   Collection Time: 03/06/19 11:20 AM   Specimen: Nasal Mucosa; Nasal Swab  Result Value Ref Range Status   MRSA, PCR NEGATIVE NEGATIVE Final   Staphylococcus aureus POSITIVE (A) NEGATIVE Final    Comment: (NOTE) The Xpert SA Assay (FDA approved for NASAL specimens in patients 57 years of age  and older), is one component of a comprehensive surveillance program. It is not intended to diagnose infection nor to guide or monitor treatment. Performed at [facility], [place] [address] [place], [place] [address]   Novel Coronavirus, NAA (Hosp order, Send-out to Ref Lab; TAT 18-24 hrs     Status: None   Collection Time: 03/08/19 11:51 AM   Specimen: Nasopharyngeal Swab; Respiratory  Result Value Ref Range Status   SARS-CoV-2, NAA NOT DETECTED NOT DETECTED Final    Comment: (NOTE) This nucleic acid amplification test was developed and its performance characteristics determined by [employer]. Nucleic acid amplification tests include PCR and TMA. This test has not been FDA cleared or approved. This test has been authorized by FDA under an Emergency Use Authorization (EUA). This test is only authorized for the duration of time the declaration that circumstances exist justifying the authorization of the emergency use of in vitro diagnostic tests for detection of SARS-CoV-2 virus and/or diagnosis of COVID-19 infection under section 564(b)(1) of the Act, 21 U.S.C. [id number]) (1), unless the authorization is terminated or revoked sooner. When diagnostic  testing is negative, the possibility of a false negative result should be considered in the context of a patient's recent exposures and the presence of clinical signs and symptoms consistent with COVID-19. An individual without symptoms of COVID- 19 and who is not shedding SARS-CoV-2 vi rus would expect to have a negative (not detected) result in this assay. Performed At: [facility] [address] [place], [place] [id number] Rush Farmer MD [id number]    [place]  Final    Comment: Performed at [facility], [place] [address], [place], [place] [address]  Culture, blood (x 2)     Status: None (Preliminary result)   Collection Time: 03/14/19  5:50 PM   Specimen: BLOOD RIGHT  HAND  Result Value Ref Range Status   Specimen Description BLOOD RIGHT HAND  Final   Special Requests   Final    BOTTLES DRAWN AEROBIC ONLY Blood Culture adequate volume   Culture   Final    NO GROWTH < 24 HOURS Performed at [facility], [place] [address], [place], [place] [address]    Report Status PENDING  Incomplete  Culture, blood (x 2)     Status: None (Preliminary result)   Collection Time: 03/14/19  6:06 PM   Specimen: BLOOD RIGHT FOREARM  Result Value Ref Range Status   Specimen Description BLOOD RIGHT FOREARM  Final   Special Requests   Final    BOTTLES DRAWN AEROBIC ONLY Blood Culture adequate volume   Culture   Final    NO GROWTH < 24 HOURS Performed at [facility], [place] [address] [place], [place] [address]    Report Status PENDING  Incomplete  Expectorated sputum assessment w rflx to resp cult     Status: None   Collection Time: 03/15/19  7:23 AM   Specimen: Sputum  Result Value Ref Range Status   Specimen Description SPUTUM  Final   Special Requests NONE  Final   Sputum evaluation   Final    THIS SPECIMEN IS ACCEPTABLE FOR SPUTUM CULTURE Performed at [facility], [place] [address] [place], [place] [address]    Report Status 03/15/2019 FINAL  Final  Culture, respiratory     Status: None (Preliminary result)   Collection Time: 03/15/19  7:23 AM   Specimen: SPU  Result Value Ref Range Status   Specimen Description SPUTUM  Final   Special Requests NONE Reflexed from [id number]  Final   Gram Stain   Final    FEW WBC PRESENT, PREDOMINANTLY PMN FEW GRAM POSITIVE COCCI IN CLUSTERS RARE GRAM NEGATIVE RODS Performed at [facility], [place] [address], [place], [place] [address]    Culture PENDING  Incomplete   Report Status PENDING  Incomplete         Radiology Studies: Ct Angio Chest Pe W Or Wo Contrast  Result Date: 03/14/2019 CLINICAL DATA:  Recent spinal surgery. Cough and shortness of breath today. EXAM: CT ANGIOGRAPHY CHEST WITH  CONTRAST TECHNIQUE: Multidetector CT imaging of the chest was performed using the standard protocol during bolus administration of intravenous contrast. Multiplanar CT image reconstructions and MIPs were obtained to evaluate the vascular anatomy. CONTRAST:  70mL OMNIPAQUE IOHEXOL 350 MG/ML SOLN COMPARISON:  12/28/2012. FINDINGS: Cardiovascular: There is satisfactory opacification of the pulmonary arteries to the segmental level. There is no evidence of a pulmonary embolism. Heart is borderline enlarged. No pericardial effusion. Thoracic aorta is normal caliber. There is aortic atherosclerosis with no significant stenosis.  No dissection. Mediastinum/Nodes: Thyroid is unremarkable. No neck base, axillary, mediastinal or hilar masses. No enlarged lymph nodes. Trachea is unremarkable. Esophagus is mildly distended. No wall thickening or inflammation. Small hiatal hernia. Lungs/Pleura: Bilateral, lower lobe, linear and peribronchovascular opacities. This may all be due to atelectasis. Consider a component of infection in the proper clinical setting. Small areas of peribronchovascular ground-glass opacity noted in the posteroinferior right upper lobe, also likely atelectasis. Remainder of the lungs is clear. No lung mass or nodule. No evidence of pulmonary edema. No pleural effusion or pneumothorax. Upper Abdomen: No acute abnormality. Musculoskeletal: No fracture or acute finding. No osteoblastic or osteolytic lesions. Status post left mastectomy.  No chest wall masses. Review of the MIP images confirms the above findings. IMPRESSION: 1. No evidence of a pulmonary embolism. 2. Bilateral lower lobe linear and peribronchovascular opacities. This may all be atelectasis. Consider pneumonia if there are consistent clinical findings. Aortic Atherosclerosis (ICD10-I70.0). Electronically Signed   By: Lajean Manes M.D.   On: 03/14/2019 14:50   Dg Chest Port 1 View  Result Date: 03/14/2019 CLINICAL DATA:  Cough, congestion,  shortness of breath EXAM: PORTABLE CHEST 1 VIEW COMPARISON:  10/20/2014 FINDINGS: Cardiomegaly. Both lungs are clear. The visualized skeletal structures are unremarkable. IMPRESSION: Cardiomegaly without acute abnormality of the lungs in AP portable projection. Electronically Signed   By: Eddie Candle M.D.   On: 03/14/2019 11:08        Scheduled Meds:  amLODipine  10 mg Oral Daily   atorvastatin  20 mg Oral q1800   buPROPion  150 mg Oral Daily   docusate sodium  100 mg Oral BID   ferrous sulfate  325 mg Oral BID WC   gabapentin  600 mg Oral BID   losartan  100 mg Oral Daily   And   hydrochlorothiazide  25 mg Oral Daily   ipratropium-albuterol  3 mL Nebulization TID   letrozole  2.5 mg Oral Daily   levothyroxine  88 mcg Oral QAC breakfast   pantoprazole  40 mg Oral Daily   potassium chloride SA  20 mEq Oral Daily   sodium chloride flush  3 mL Intravenous Q12H   Continuous Infusions:  sodium chloride     sodium chloride 100 mL/hr at 03/15/19 0605   ceFEPime (MAXIPIME) IV 2 g (03/15/19 0543)   vancomycin       LOS: 3 days    Time spent: 25 minutes    Barb Merino, MD [facility] Pager [phone]  If 7PM-7AM, please contact night-coverage [email or web address] Password [email or web address] 03/15/2019, 2:26 PM

## 2019-03-15 NOTE — Progress Notes (Signed)
Neurosurgery Service Progress Note  Subjective: No acute events overnight, sats improving, subjectively feels better than yesterday  Objective: Vitals:   03/14/19 2059 03/14/19 2315 03/15/19 0310 03/15/19 0721  BP:  (!) 163/63 118/63   Pulse:  96 89   Resp:  18 18   Temp:  99.1 F (37.3 C) 98.7 F (37.1 C)   TempSrc:  Oral Oral   SpO2: 97% 94% 97% 92%  Weight:      Height:       Temp (24hrs), Avg:99 F (37.2 C), Min:98.3 F (36.8 C), Max:99.9 F (37.7 C)  CBC Latest Ref Rng & Units 03/14/2019 03/14/2019 03/06/2019  WBC 4.0 - 10.5 K/uL - 13.5(H) 5.7  Hemoglobin 12.0 - 15.0 g/dL 9.4(L) 9.5(L) 11.1(L)  Hematocrit 36.0 - 46.0 % 31.0(L) 30.9(L) 34.0(L)  Platelets 150 - 400 K/uL - 264 263   BMP Latest Ref Rng & Units 03/14/2019 03/06/2019 04/02/2018  Glucose 70 - 99 mg/dL 178(H) 114(H) 153(H)  BUN 6 - 20 mg/dL 25(H) 20 21  Creatinine 0.44 - 1.00 mg/dL 1.37(H) 1.21(H) 1.27(H)  BUN/Creat Ratio 6 - 22 (calc) - - -  Sodium 135 - 145 mmol/L 133(L) 136 137  Potassium 3.5 - 5.1 mmol/L 3.9 3.5 3.3(L)  Chloride 98 - 111 mmol/L 97(L) 102 100  CO2 22 - 32 mmol/L 21(L) 22 27  Calcium 8.9 - 10.3 mg/dL 8.6(L) 9.4 9.4    Intake/Output Summary (Last 24 hours) at 03/15/2019 0821 Last data filed at 03/15/2019 0543 Gross per 24 hour  Intake 1837.43 ml  Output -  Net 1837.43 ml    Current Facility-Administered Medications:  .  0.9 %  sodium chloride infusion, 250 mL, Intravenous, Continuous, Jordynn Perrier A, MD .  0.9 %  sodium chloride infusion, , Intravenous, Continuous, Smith, Rondell A, MD, Last Rate: 100 mL/hr at 03/15/19 0605 .  acetaminophen (TYLENOL) tablet 650 mg, 650 mg, Oral, Q4H PRN, 650 mg at 03/13/19 2359 **OR** acetaminophen (TYLENOL) suppository 650 mg, 650 mg, Rectal, Q4H PRN, Jamin Panther A, MD .  albuterol (PROVENTIL) (2.5 MG/3ML) 0.083% nebulizer solution 2.5 mg, 2.5 mg, Nebulization, Q4H PRN, Judith Part, MD .  ALPRAZolam Duanne Moron) tablet 0.5 mg, 0.5 mg,  Oral, BID PRN, Tamala Julian, Rondell A, MD .  alum & mag hydroxide-simeth (MAALOX/MYLANTA) 200-200-20 MG/5ML suspension 30 mL, 30 mL, Oral, Q4H PRN, Judith Part, MD, 30 mL at 03/15/19 0318 .  amLODipine (NORVASC) tablet 10 mg, 10 mg, Oral, Daily, Shant Hence, Joyice Faster, MD, 10 mg at 03/14/19 1317 .  atorvastatin (LIPITOR) tablet 20 mg, 20 mg, Oral, q1800, Judith Part, MD, 20 mg at 03/14/19 1731 .  baclofen (LIORESAL) tablet 10 mg, 10 mg, Oral, TID PRN, Judith Part, MD, 10 mg at 03/14/19 2145 .  buPROPion (WELLBUTRIN XL) 24 hr tablet 150 mg, 150 mg, Oral, Daily, Gray Doering A, MD, 150 mg at 03/13/19 1210 .  ceFEPIme (MAXIPIME) 2 g in sodium chloride 0.9 % 100 mL IVPB, 2 g, Intravenous, Q12H, Smith, Rondell A, MD, Last Rate: 200 mL/hr at 03/15/19 0543, 2 g at 03/15/19 0543 .  docusate sodium (COLACE) capsule 100 mg, 100 mg, Oral, BID, Judith Part, MD, 100 mg at 03/14/19 2145 .  ferrous sulfate tablet 325 mg, 325 mg, Oral, BID WC, Smith, Rondell A, MD .  gabapentin (NEURONTIN) tablet 600 mg, 600 mg, Oral, BID, Judith Part, MD, 600 mg at 03/14/19 2145 .  guaiFENesin-dextromethorphan (ROBITUSSIN DM) 100-10 MG/5ML syrup 15 mL, 15 mL, Oral,  Q4H PRN, Judith Part, MD, 15 mL at 03/14/19 0418 .  losartan (COZAAR) tablet 100 mg, 100 mg, Oral, Daily, 100 mg at 03/14/19 1317 **AND** hydrochlorothiazide (HYDRODIURIL) tablet 25 mg, 25 mg, Oral, Daily, Hanson Medeiros A, MD, 25 mg at 03/14/19 1317 .  ipratropium-albuterol (DUONEB) 0.5-2.5 (3) MG/3ML nebulizer solution 3 mL, 3 mL, Nebulization, TID, Selby Foisy, Joyice Faster, MD, 3 mL at 03/15/19 0721 .  letrozole [facility]) tablet 2.5 mg, 2.5 mg, Oral, Daily, Mattia Osterman A, MD, 2.5 mg at 03/14/19 1316 .  levothyroxine (SYNTHROID) tablet 88 mcg, 88 mcg, Oral, QAC breakfast, Judith Part, MD, 88 mcg at 03/15/19 0606 .  menthol-cetylpyridinium (CEPACOL) lozenge 3 mg, 1 lozenge, Oral, PRN **OR** phenol (CHLORASEPTIC)  mouth spray 1 spray, 1 spray, Mouth/Throat, PRN, Dabney Dever A, MD .  ondansetron (ZOFRAN) tablet 4 mg, 4 mg, Oral, Q6H PRN **OR** ondansetron (ZOFRAN) injection 4 mg, 4 mg, Intravenous, Q6H PRN, Judith Part, MD .  oxyCODONE (Oxy IR/ROXICODONE) immediate release tablet 2.5 mg, 2.5 mg, Oral, Q4H PRN, Smith, Rondell A, MD .  oxyCODONE (Oxy IR/ROXICODONE) immediate release tablet 5 mg, 5 mg, Oral, Q4H PRN, Fuller Plan A, MD, 5 mg at 03/15/19 0211 .  pantoprazole (PROTONIX) EC tablet 40 mg, 40 mg, Oral, Daily, Ripley Bogosian, Joyice Faster, MD, 40 mg at 03/13/19 1210 .  polyethylene glycol (MIRALAX / GLYCOLAX) packet 17 g, 17 g, Oral, Daily PRN, Clyde Upshaw A, MD .  potassium chloride SA (K-DUR) CR tablet 20 mEq, 20 mEq, Oral, Daily, Shriley Joffe, Joyice Faster, MD, 20 mEq at 03/13/19 1209 .  sodium chloride flush (NS) 0.9 % injection 3 mL, 3 mL, Intravenous, Q12H, Cameshia Cressman A, MD .  sodium chloride flush (NS) 0.9 % injection 3 mL, 3 mL, Intravenous, PRN, Noami Bove A, MD .  vancomycin (VANCOCIN) IVPB 1000 mg/200 mL premix, 1,000 mg, Intravenous, Q24H, Lyndee Leo, [facility]   Physical Exam: AOx3, PERRL, EOMI, FS, Strength 5/5 x4, SILTx4 Incision c/d/i  Assessment & Plan: 58 y.o. woman s/p open L4-5 TLIF, recovering well, post-op worsening SOB  -advance activity as tolerated -PT/OT rec home health OT, no PT f/u  -rest of care per [facility], currently empiric Tx for r/o sepsis -discharge pending medical issues -SCDs/TEDs, SQH  Judith Part  03/15/19 8:21 AM

## 2019-03-16 LAB — CBC WITH DIFFERENTIAL/PLATELET
Abs Immature Granulocytes: 0.08 10*3/uL — ABNORMAL HIGH (ref 0.00–0.07)
Basophils Absolute: 0.1 10*3/uL (ref 0.0–0.1)
Basophils Relative: 0 %
Eosinophils Absolute: 0.6 10*3/uL — ABNORMAL HIGH (ref 0.0–0.5)
Eosinophils Relative: 5 %
HCT: 25.2 % — ABNORMAL LOW (ref 36.0–46.0)
Hemoglobin: 7.7 g/dL — ABNORMAL LOW (ref 12.0–15.0)
Immature Granulocytes: 1 %
Lymphocytes Relative: 26 %
Lymphs Abs: 3 10*3/uL (ref 0.7–4.0)
MCH: 23.3 pg — ABNORMAL LOW (ref 26.0–34.0)
MCHC: 30.6 g/dL (ref 30.0–36.0)
MCV: 76.4 fL — ABNORMAL LOW (ref 80.0–100.0)
Monocytes Absolute: 0.7 10*3/uL (ref 0.1–1.0)
Monocytes Relative: 6 %
Neutro Abs: 7.2 10*3/uL (ref 1.7–7.7)
Neutrophils Relative %: 62 %
Platelets: 236 10*3/uL (ref 150–400)
RBC: 3.3 MIL/uL — ABNORMAL LOW (ref 3.87–5.11)
RDW: 16.3 % — ABNORMAL HIGH (ref 11.5–15.5)
WBC: 11.6 10*3/uL — ABNORMAL HIGH (ref 4.0–10.5)
nRBC: 0 % (ref 0.0–0.2)

## 2019-03-16 LAB — HEMOGLOBIN AND HEMATOCRIT, BLOOD
HCT: 27.2 % — ABNORMAL LOW (ref 36.0–46.0)
Hemoglobin: 8.7 g/dL — ABNORMAL LOW (ref 12.0–15.0)

## 2019-03-16 LAB — BASIC METABOLIC PANEL
Anion gap: 9 (ref 5–15)
BUN: 14 mg/dL (ref 6–20)
CO2: 24 mmol/L (ref 22–32)
Calcium: 8.2 mg/dL — ABNORMAL LOW (ref 8.9–10.3)
Chloride: 104 mmol/L (ref 98–111)
Creatinine, Ser: 1.04 mg/dL — ABNORMAL HIGH (ref 0.44–1.00)
GFR calc Af Amer: >60 mL/min (ref >60)
GFR calc non Af Amer: 58 mL/min — ABNORMAL LOW (ref >60)
Glucose, Bld: 159 mg/dL — ABNORMAL HIGH (ref 70–99)
Potassium: 3.1 mmol/L — ABNORMAL LOW (ref 3.5–5.1)
Sodium: 137 mmol/L (ref 135–145)

## 2019-03-16 LAB — PROCALCITONIN: Procalcitonin: <0.1 ng/mL

## 2019-03-16 LAB — PREPARE RBC (CROSSMATCH)

## 2019-03-16 LAB — TYPE AND SCREEN
ABO/RH(D): O POS
Antibody Screen: NEGATIVE

## 2019-03-16 MED ORDER — MAGNESIUM SULFATE 2 GM/50ML IV SOLN
2.0000 g | Freq: Once | INTRAVENOUS | Status: AC
  Administered 2019-03-16: 2 g via INTRAVENOUS
  Filled 2019-03-16: qty 50

## 2019-03-16 MED ORDER — SODIUM CHLORIDE 3 % IN NEBU
4.0000 mL | INHALATION_SOLUTION | Freq: Two times a day (BID) | RESPIRATORY_TRACT | Status: DC
  Administered 2019-03-16: 4 mL via RESPIRATORY_TRACT
  Filled 2019-03-16 (×3): qty 4

## 2019-03-16 MED ORDER — POTASSIUM CHLORIDE CRYS ER 20 MEQ PO TBCR
40.0000 meq | EXTENDED_RELEASE_TABLET | Freq: Once | ORAL | Status: AC
  Administered 2019-03-16: 40 meq via ORAL
  Filled 2019-03-16: qty 2

## 2019-03-16 MED ORDER — SODIUM CHLORIDE 0.9 % IV SOLN
510.0000 mg | Freq: Once | INTRAVENOUS | Status: AC
  Administered 2019-03-16: 510 mg via INTRAVENOUS
  Filled 2019-03-16: qty 17

## 2019-03-16 MED ORDER — GUAIFENESIN ER 600 MG PO TB12
1200.0000 mg | ORAL_TABLET | Freq: Two times a day (BID) | ORAL | Status: DC
  Administered 2019-03-16 – 2019-03-17 (×3): 1200 mg via ORAL
  Filled 2019-03-16 (×3): qty 2

## 2019-03-16 MED ORDER — FERROUS SULFATE 325 (65 FE) MG PO TABS
325.0000 mg | ORAL_TABLET | Freq: Every day | ORAL | Status: DC
  Administered 2019-03-17: 325 mg via ORAL
  Filled 2019-03-16: qty 1

## 2019-03-16 MED ORDER — SODIUM CHLORIDE 3 % IN NEBU: 4.0000 mL | INHALATION_SOLUTION | Freq: Every day | RESPIRATORY_TRACT | Status: DC

## 2019-03-16 MED ORDER — SODIUM CHLORIDE 0.9% IV SOLUTION: Freq: Once | INTRAVENOUS | Status: DC

## 2019-03-16 NOTE — Progress Notes (Signed)
Occupational Therapy Treatment Patient Details Name: Julie Ross MRN: [id number] DOB: [birth year] Today's Date: 03/16/2019    History of present illness Pt is 58 yo female s/p lumbar interbody fusion of L4-5 and lumbar laminectomy due to BLE radicular pain, bilateral L4-5 synovial cysts with lateral recess stenosis and severe spondylosis. Rapid response for acute respiratory failure 9/26. PMH including MS, breast cancer, hypothyroidism, HTN, bilateral TKAs, and sleep apnea.    OT comments  Pt progressing. Pt had just working with PT so chair level education provided. Pt education on LB dressing with hip kit. Pt unable to perform figure 4 technique. Pt simulated donning pillow case and method for weakest leg going on first and stronger leg doffing first with reacher. Pt and spouse feel confident with technique using reacher. Pt education for entire hip kit and pt comprehending well. Pt able to state all 3 back precautions. Pt continues to progress and would benefit from continued OT. OT following acutely.    Follow Up Recommendations  Home health OT    Equipment Recommendations  None recommended by OT    Recommendations for Other Services      Precautions / Restrictions Precautions Precautions: Back;Fall Precaution Booklet Issued: Yes (comment) Precaution Comments: able to recall 3/3 precautions Required Braces or Orthoses: Other Brace Other Brace: No brace per MD order, brace in room and donned for comfort Restrictions Weight Bearing Restrictions: No       Mobility Bed Mobility Overal bed mobility: Needs Assistance Bed Mobility: Rolling;Sidelying to Sit Rolling: Supervision Sidelying to sit: Supervision       General bed mobility comments: pt sitting EOB   Transfers Overall transfer level: Needs assistance Equipment used: Rolling walker (2 wheeled) Transfers: Sit to/from Stand Sit to Stand: Min guard         General transfer comment: Close guard for safety as pt  powered up to full stand. Increased time but demonstrated proper hand placement on seated surface for safety.     Balance Overall balance assessment: Needs assistance Sitting-balance support: No upper extremity supported;Feet supported Sitting balance-Leahy Scale: Fair Sitting balance - Comments: tactile cues to sit up right; cues to keep feet on floor and sit up right as pt leaning R during sitting Postural control: Right lateral lean Standing balance support: Bilateral upper extremity supported;During functional activity Standing balance-Leahy Scale: Poor Standing balance comment: reliant on BUE support                           ADL either performed or assessed with clinical judgement   ADL Overall ADL's : Needs assistance/impaired                     Lower Body Dressing: Cueing for safety;Cueing for sequencing;With adaptive equipment;Set up Lower Body Dressing Details (indicate cue type and reason): Pt education on LB dressing with hip kit. Pt unable to perform figure 4 technique. Pt simulated donning pillow case and method for weakest leg going on first and stronger leg doffing first. Pt and spouse feel confident with technique using reacher.             Functional mobility during ADLs: Min guard General ADL Comments: Pt had just worked with PT so pt remained in recliner for OT session. Pt's spouse to stay at home with pt for a few.     Vision   Vision Assessment?: Vision impaired- to be further tested in functional context   Perception  Praxis      Cognition Arousal/Alertness: Awake/alert Behavior During Therapy: WFL for tasks assessed/performed Overall Cognitive Status: Within Functional Limits for tasks assessed                                          Exercises     Shoulder Instructions       General Comments      Pertinent Vitals/ Pain       Pain Assessment: Faces Faces Pain Scale: Hurts little more Pain Location:  back Pain Descriptors / Indicators: Sore;Guarding Pain Intervention(s): Monitored during session  Home Living                                          Prior Functioning/Environment              Frequency  Min 2X/week        Progress Toward Goals  OT Goals(current goals can now be found in the care plan section)  Progress towards OT goals: Progressing toward goals  Acute Rehab OT Goals Patient Stated Goal: Feel better OT Goal Formulation: With patient Time For Goal Achievement: 03/27/19 Potential to Achieve Goals: Good ADL Goals Pt Will Perform Grooming: with modified independence;standing Pt Will Perform Upper Body Dressing: with modified independence;sitting Pt Will Perform Lower Body Dressing: with supervision;sit to/from stand Pt Will Transfer to Toilet: with supervision;ambulating;bedside commode Pt Will Perform Toileting - Clothing Manipulation and hygiene: with supervision;sit to/from stand Pt Will Perform Tub/Shower Transfer: with supervision;ambulating;shower seat;rolling walker;grab bars Additional ADL Goal #1: Pt will verbalize 3/3 back precautions with 1 verbal cue Additional ADL Goal #2: Pt will perform bed mobility using compensatory strategies with supervision  Plan Discharge plan remains appropriate;Frequency needs to be updated    Co-evaluation                 AM-PAC OT "6 Clicks" Daily Activity     Outcome Measure   Help from another person eating meals?: None Help from another person taking care of personal grooming?: A Little Help from another person toileting, which includes using toliet, bedpan, or urinal?: A Lot Help from another person bathing (including washing, rinsing, drying)?: A Little Help from another person to put on and taking off regular upper body clothing?: None Help from another person to put on and taking off regular lower body clothing?: A Little 6 Click Score: 19    End of Session Equipment  Utilized During Treatment: Rolling walker;Back brace  OT Visit Diagnosis: Unsteadiness on feet (R26.81);Other abnormalities of gait and mobility (R26.89);Muscle weakness (generalized) (M62.81);Pain   Activity Tolerance Patient limited by lethargy   Patient Left in chair;with call bell/phone within reach;with chair alarm set;with family/visitor present   Nurse Communication Mobility status        Time: 0109-3235 OT Time Calculation (min): 14 min  Charges: OT General Charges $OT Visit: 1 Visit OT Treatments $Self Care/Home Management : 8-22 mins  Darryl Nestle) Marsa Aris OTR/L Acute Rehabilitation Services Pager: [phone] Office: [phone]    Audie Pinto 03/16/2019, 3:25 PM

## 2019-03-16 NOTE — Progress Notes (Signed)
SATURATION QUALIFICATIONS: (This note is used to comply with regulatory documentation for home oxygen)  Patient Saturations on Room Air at Rest =97%  Patient Saturations on Room Air while Ambulating = 99%  Patient Saturations on 0 Liters of oxygen while Ambulating = NA%  No resp distress while ambulating.  Does have a non-productive cough with some wheezing heard posteriorly but as she coughs, does sound clearer.

## 2019-03-16 NOTE — Progress Notes (Signed)
Physical Therapy Treatment Patient Details Name: Julie Ross MRN: [id number] DOB: [birth year] Today's Date: 03/16/2019    History of Present Illness Pt is 58 yo female s/p lumbar interbody fusion of L4-5 and lumbar laminectomy due to BLE radicular pain, bilateral L4-5 synovial cysts with lateral recess stenosis and severe spondylosis. PMH including MS, breast cancer, hypothyroidism, HTN, bilateral TKAs, and sleep apnea.     PT Comments    Pt progressing towards physical therapy goals. Was able to perform transfers and ambulation with gross min guard assist but progressed to supervision for safety by end of session. Continues to demonstrate decreased tolerance for functional activity. Will continue to follow and progress as able per POC.     Follow Up Recommendations  No PT follow up;Supervision for mobility/OOB     Equipment Recommendations  None recommended by PT    Recommendations for Other Services       Precautions / Restrictions Precautions Precautions: Back;Fall Precaution Booklet Issued: Yes (comment) Precaution Comments: able to recall 3/3 precautions Required Braces or Orthoses: Other Brace Other Brace: No brace per MD order, brace in room and donned for comfort Restrictions Weight Bearing Restrictions: No    Mobility  Bed Mobility Overal bed mobility: Needs Assistance Bed Mobility: Rolling;Sidelying to Sit Rolling: Supervision Sidelying to sit: Supervision       General bed mobility comments: Increased time and effort to transition to EOB. Heavy use of rail.   Transfers Overall transfer level: Needs assistance Equipment used: Rolling walker (2 wheeled) Transfers: Sit to/from Stand Sit to Stand: Min guard         General transfer comment: Close guard for safety as pt powered up to full stand. Increased time but demonstrated proper hand placement on seated surface for safety.   Ambulation/Gait Ambulation/Gait assistance: Min guard;Supervision Gait  Distance (Feet): 200 Feet Assistive device: Rolling walker (2 wheeled) Gait Pattern/deviations: Step-through pattern;Decreased stride length;Trunk flexed Gait velocity: Decreased Gait velocity interpretation: <1.8 ft/sec, indicate of risk for recurrent falls General Gait Details: Progressed to supervision for safety by end of gait training. Slow but generally steady.    Stairs             Wheelchair Mobility    Modified Rankin (Stroke Patients Only)       Balance Overall balance assessment: Needs assistance Sitting-balance support: No upper extremity supported;Feet supported Sitting balance-Leahy Scale: Fair Sitting balance - Comments: tactile cues to sit up right; cues to keep feet on floor and sit up right as pt leaning R during sitting Postural control: Right lateral lean Standing balance support: Bilateral upper extremity supported;During functional activity Standing balance-Leahy Scale: Poor Standing balance comment: reliant on BUE support                            Cognition Arousal/Alertness: Awake/alert Behavior During Therapy: WFL for tasks assessed/performed Overall Cognitive Status: Within Functional Limits for tasks assessed                                        Exercises      General Comments        Pertinent Vitals/Pain Pain Assessment: Faces Faces Pain Scale: Hurts little more Pain Location: back Pain Descriptors / Indicators: Sore;Guarding Pain Intervention(s): Monitored during session;Repositioned    Home Living  Prior Function            PT Goals (current goals can now be found in the care plan section) Acute Rehab PT Goals Patient Stated Goal: Feel better PT Goal Formulation: With patient Time For Goal Achievement: 03/20/19 Potential to Achieve Goals: Good Progress towards PT goals: Progressing toward goals    Frequency    Min 5X/week      PT Plan Current plan  remains appropriate    Co-evaluation PT/OT/SLP Co-Evaluation/Treatment: Yes            AM-PAC PT "6 Clicks" Mobility   Outcome Measure  Help needed turning from your back to your side while in a flat bed without using bedrails?: None Help needed moving from lying on your back to sitting on the side of a flat bed without using bedrails?: A Little Help needed moving to and from a bed to a chair (including a wheelchair)?: A Little Help needed standing up from a chair using your arms (e.g., wheelchair or bedside chair)?: A Little Help needed to walk in hospital room?: A Little Help needed climbing 3-5 steps with a railing? : A Little 6 Click Score: 19    End of Session Equipment Utilized During Treatment: Gait belt Activity Tolerance: Patient tolerated treatment well Patient left: with call bell/phone within reach;in chair;with chair alarm set Nurse Communication: Mobility status PT Visit Diagnosis: Unsteadiness on feet (R26.81);Pain Pain - part of body: (back)     Time: 1330-1406 PT Time Calculation (min) (ACUTE ONLY): 36 min  Charges:  $Gait Training: 23-37 mins                     Julie Ross, PT, DPT Acute Rehabilitation Services Pager: [phone] Office: [phone]    Thelma Comp 03/16/2019, 2:41 PM

## 2019-03-16 NOTE — Progress Notes (Signed)
Consult PROGRESS NOTE  Julie Ross [id number] DOB: [birth year] DOA: 03/12/2019 PCP: Luetta Nutting, DO   Consulting attending physician: Dr. Joaquim Nam Reason for consult: Medical management  HPI/Recap of past 85 hours: 58 year old female with history of hypertension, hypothyroidism, multiple sclerosis, history of breast cancer, anxiety depression and anemia, obstructive sleep apnea not on CPAP who was admitted to the hospital with bilateral lower extremity radicular pain.  MRI showed L4-5 cyst with lateral recess stenosis and severe  Spondylolysis.  Patient underwent L4 laminectomy with left L4 and 5 open transforaminal lumbar interbody fusion on 9/22.  Early morning hours of 924, patient started having cough and shortness of breath and she was 82% on room air.  Patient was given multiple breathing treatments without improvement in symptoms.  Chest x-ray revealed cardiomegaly without sounds of edema or infiltrate.  Medicine consulted for hypoxia and shortness of breath.  03/16/19: Patient was seen and examined at her bedside this morning she reports a semi-productive cough with no chest pain.  DC'd IV fluids, started pulmonary toilet.  O2 saturation reassuring 97% on room air.   Assessment/Plan: Active Problems:   Lumbar radiculopathy  Acute hypoxic respiratory failure in a postop patient History of untreated OSA O2 saturation reassuring 97% on room air Home O2 evaluation ordered for DC planning DC IV fluids Start pulmonary toilet with Mucinex 1200 mg twice daily, flutter valve, hypersaline nebs twice daily Maintain O2 saturation greater than 92%.  Leukocytosis with concern for H CAP Currently on cefepime and IV vancomycin, day #3 WBC trending down from 13.5 on 03/14/19 to 11.6 on 03/16/2019 Afebrile Procalcitonin 0.14 03/14/2019 Repeat procalcitonin if negative will DC IV antibiotics  Acute blood loss anemia/chronic microcytic anemia  post surgery Baseline hemoglobin 11  Hemoglobin drop 7.7 2 unit PRBCs ordered by primary team to be transfused Severe iron deficiency from studies done on 03/15/2019 with transSat of 2, iron of 8 and ferritin of 31 Recommend to transfuse IV Feraheme 510 mg x1 dose after PRBCs blood transfusion Obtain CBC in the morning  Resolving AKI on CKD 3 with IV fluid hydration Creatinine appears to be at baseline 1.0 with GFR 58 Presented with creatinine of 1.37 with GFR 42 Continue to avoid nephrotoxins Continue to monitor urine output DC IV fluids today 07/15/2018  Hypokalemia Potassium 3.9 Repleted with KCl p.o. 40 mEq once On maintenance KCl 20 mEq daily Added IV magnesium 2 g once Repeat BMP in the morning Obtain magnesium level in the morning  Hyperglycemia Obtain Hemoglobin A1c Not on diabetic medications  Hypothyroidism Continue levothyroxine  Essential hypertension Continue home antihypertensives  Chronic anxiety/depression Continue Wellbutrin and Ativan as needed  Lumbar radiculopathy status post surgery: Surgically stable as per primary team.  Hyperlipidemia Continue Lipitor  OSA, not on CPAP Recommend pulmonology follow-up   DVT prophylaxis: SCDs Code Status: Full code Family Communication: Husband at the bedside   Disposition Plan: Anticipate DC to home possibly tomorrow 03/17/2019 with home health OT.   Procedures:   Lumbar surgery, 923  Antimicrobials:   Vancomycin, 03/14/2019  Cefepime, 03/14/2019   Thank you for allowing [place] to participate in the care of this patient.  Please call with questions.   Objective: Vitals:   03/15/19 2143 03/15/19 2315 03/16/19 0358 03/16/19 0745  BP:  (!) 117/53 (!) 112/56 (!) 144/74  Pulse:  86 75 83  Resp:  16 16 18   Temp:  98.7 F (37.1 C) 98.5 F (36.9 C) 98.1 F (36.7 C)  TempSrc:  Oral Oral  Oral  SpO2: 94% 90% 92% 97%  Weight:      Height:        Intake/Output Summary (Last 24 hours) at 03/16/2019 1147 Last data filed at 03/16/2019  0900 Gross per 24 hour  Intake 2895.52 ml  Output -  Net 2895.52 ml   Filed Weights   03/12/19 0621  Weight: 104.3 kg    Exam:  . General: 58 y.o. year-old female well developed well nourished in no acute distress.  Alert and oriented x3. . Cardiovascular: Regular rate and rhythm with no rubs or gallops.  No thyromegaly or JVD noted.   Marland Kitchen Respiratory: Mild wheezing posteriorly bilaterally. Good inspiratory effort. . Abdomen: Soft nontender nondistended with normal bowel sounds x4 quadrants. . Musculoskeletal: Trace lower extremity edema. 2/4 pulses in all 4 extremities. Marland Kitchen Psychiatry: Mood is appropriate for condition and setting   Data Reviewed: CBC: Recent Labs  Lab 03/14/19 0840 03/14/19 1229 03/16/19 0328  WBC 13.5*  --  11.6*  NEUTROABS  --   --  7.2  HGB 9.5* 9.4* 7.7*  HCT 30.9* 31.0* 25.2*  MCV 76.5*  --  76.4*  PLT 264  --  [id number]   Basic Metabolic Panel: Recent Labs  Lab 03/14/19 0840 03/16/19 0328  NA 133* 137  K 3.9 3.1*  CL 97* 104  CO2 21* 24  GLUCOSE 178* 159*  BUN 25* 14  CREATININE 1.37* 1.04*  CALCIUM 8.6* 8.2*  PHOS 3.6  --    GFR: Estimated Creatinine Clearance: 70.2 mL/min (A) (by C-G formula based on SCr of 1.04 mg/dL (H)). Liver Function Tests: Recent Labs  Lab 03/14/19 0840  ALBUMIN 3.8   No results for input(s): LIPASE, AMYLASE in the last 168 hours. No results for input(s): AMMONIA in the last 168 hours. Coagulation Profile: No results for input(s): INR, PROTIME in the last 168 hours. Cardiac Enzymes: No results for input(s): CKTOTAL, CKMB, CKMBINDEX, TROPONINI in the last 168 hours. BNP (last 3 results) No results for input(s): PROBNP in the last 8760 hours. HbA1C: No results for input(s): HGBA1C in the last 72 hours. CBG: No results for input(s): GLUCAP in the last 168 hours. Lipid Profile: No results for input(s): CHOL, HDL, LDLCALC, TRIG, CHOLHDL, LDLDIRECT in the last 72 hours. Thyroid Function Tests: Recent Labs     03/15/19 0358  TSH 0.624   Anemia Panel: Recent Labs    03/15/19 0358  FERRITIN 31  TIBC 353  IRON 8*   Urine analysis:    Component Value Date/Time   COLORURINE YELLOW 08/05/2013 [place] 08/05/2013 1209   LABSPEC 1.014 08/05/2013 1209   PHURINE 5.5 08/05/2013 1209   GLUCOSEU NEGATIVE 08/05/2013 1209   HGBUR NEGATIVE 08/05/2013 [place] 08/05/2013 1209   KETONESUR NEGATIVE 08/05/2013 1209   PROTEINUR NEGATIVE 08/05/2013 1209   UROBILINOGEN 0.2 08/05/2013 1209   NITRITE NEGATIVE 08/05/2013 1209   LEUKOCYTESUR SMALL (A) 08/05/2013 1209   Sepsis Labs: @LABRCNTIP (procalcitonin:4,lacticidven:4)  ) Recent Results (from the past 240 hour(s))  Novel Coronavirus, NAA (Hosp order, Send-out to Ref Lab; TAT 18-24 hrs     Status: None   Collection Time: 03/08/19 11:51 AM   Specimen: Nasopharyngeal Swab; Respiratory  Result Value Ref Range Status   SARS-CoV-2, NAA NOT DETECTED NOT DETECTED Final    Comment: (NOTE) This nucleic acid amplification test was developed and its performance characteristics determined by [employer]. Nucleic acid amplification tests include PCR and TMA. This test has not been  FDA cleared or approved. This test has been authorized by FDA under an Emergency Use Authorization (EUA). This test is only authorized for the duration of time the declaration that circumstances exist justifying the authorization of the emergency use of in vitro diagnostic tests for detection of SARS-CoV-2 virus and/or diagnosis of COVID-19 infection under section 564(b)(1) of the Act, 21 U.S.C. [id number]) (1), unless the authorization is terminated or revoked sooner. When diagnostic testing is negative, the possibility of a false negative result should be considered in the context of a patient's recent exposures and the presence of clinical signs and symptoms consistent with COVID-19. An individual without symptoms of COVID- 19 and  who is not shedding SARS-CoV-2 vi rus would expect to have a negative (not detected) result in this assay. Performed At: [facility] [address] [place], [place] [id number] Rush Farmer MD [id number]    [place]  Final    Comment: Performed at [facility], [place] [address], [place], [place] [address]  Culture, blood (x 2)     Status: None (Preliminary result)   Collection Time: 03/14/19  5:50 PM   Specimen: BLOOD RIGHT HAND  Result Value Ref Range Status   Specimen Description BLOOD RIGHT HAND  Final   Special Requests   Final    BOTTLES DRAWN AEROBIC ONLY Blood Culture adequate volume   Culture   Final    NO GROWTH 2 DAYS Performed at [facility], [place] [address], [place], [place] [address]    Report Status PENDING  Incomplete  Culture, blood (x 2)     Status: None (Preliminary result)   Collection Time: 03/14/19  6:06 PM   Specimen: BLOOD RIGHT FOREARM  Result Value Ref Range Status   Specimen Description BLOOD RIGHT FOREARM  Final   Special Requests   Final    BOTTLES DRAWN AEROBIC ONLY Blood Culture adequate volume   Culture   Final    NO GROWTH 2 DAYS Performed at [facility], [place] [address] [place], [place] [address]    Report Status PENDING  Incomplete  Expectorated sputum assessment w rflx to resp cult     Status: None   Collection Time: 03/15/19  7:23 AM   Specimen: Sputum  Result Value Ref Range Status   Specimen Description SPUTUM  Final   Special Requests NONE  Final   Sputum evaluation   Final    THIS SPECIMEN IS ACCEPTABLE FOR SPUTUM CULTURE Performed at [facility], [place] [address], [place], [place] [address]    Report Status 03/15/2019 FINAL  Final  Culture, respiratory     Status: None (Preliminary result)   Collection Time: 03/15/19  7:23 AM   Specimen: SPU  Result Value Ref Range Status   Specimen Description SPUTUM  Final   Special Requests NONE Reflexed from [id number]  Final   Gram  Stain   Final    FEW WBC PRESENT, PREDOMINANTLY PMN FEW GRAM POSITIVE COCCI IN CLUSTERS RARE GRAM NEGATIVE RODS    Culture   Final    CULTURE REINCUBATED FOR BETTER GROWTH Performed at [facility], [place] [address], [place], [place] [address]    Report Status PENDING  Incomplete      Studies: No results found.  Scheduled Meds: . sodium chloride   Intravenous Once  . amLODipine  10 mg Oral Daily  . atorvastatin  20 mg Oral q1800  . buPROPion  150 mg Oral Daily  .  docusate sodium  100 mg Oral BID  . ferrous sulfate  325 mg Oral BID WC  . gabapentin  600 mg Oral BID  . guaiFENesin  1,200 mg Oral BID  . losartan  100 mg Oral Daily   And  . hydrochlorothiazide  25 mg Oral Daily  . letrozole  2.5 mg Oral Daily  . levothyroxine  88 mcg Oral QAC breakfast  . pantoprazole  40 mg Oral Daily  . potassium chloride SA  20 mEq Oral Daily  . potassium chloride  40 mEq Oral Once  . sodium chloride flush  3 mL Intravenous Q12H  . sodium chloride HYPERTONIC  4 mL Nebulization Daily    Continuous Infusions: . sodium chloride    . ceFEPime (MAXIPIME) IV 2 g (03/16/19 0601)  . magnesium sulfate bolus IVPB    . vancomycin 1,000 mg (03/15/19 2100)     LOS: 4 days     Kayleen Memos, MD [facility] Pager [phone]  If 7PM-7AM, please contact night-coverage [email or web address] Password [facility] 03/16/2019, 11:47 AM

## 2019-03-16 NOTE — Progress Notes (Signed)
  NEUROSURGERY PROGRESS NOTE   No issues overnight.  No significant change overall compared to yesterday No new sx. Back appropriately sore. No radicular pains.  EXAM:  BP (!) 144/74 (BP Location: Right Arm)   Pulse 83   Temp 98.1 F (36.7 C) (Oral)   Resp 18   Ht 5\' 6"  (1.676 m)   Wt 104.3 kg   LMP 12/13/2011   SpO2 97%   BMI 37.12 kg/m   Awake, alert, oriented  Speech fluent, appropriate  CN grossly intact  5/5 BUE/BLE  Incision c/d/i  IMPRESSION/PLAN 58 y.o. female s/p L4-5 TLIF. Stable overall. - ABL: Hgb downtrending, hgb 7.7 this am. will transfuse 2 units - Awaiting medical clearance for d/c - Hopeful for d/c tomorrow am

## 2019-03-16 NOTE — Progress Notes (Signed)
Received a call from blood bank this morning because said Hgb was 7.7 and she is O positive blood type.  Said there is a Engineer, site and unless pt is actively bleeding, going to surgery, and Hgb <7, all blood transfusions today would have to be approved by Dr Saralyn Pilar, after MD ordering blood speaks with him to get clearance.  This nurse did call Dr Colleen Can answering service to report that and they had the PA Kim to call back and she said ok.  This nurse explained that the order also needs to be discontinued if the transfusion was not to be done and if it is to be done, this nurse would need a call back from the doctor or doctor's rep, along with the lab, as everything is on hold at this time.  She said ok and they would take care of it.  After a couple hours with no return, this nurse did speak with Dr Nevada Crane, who said she not to worry about it but she ordered another H&H and then would decide at that time and if it were lower and it needed to be given, she would reach out to the blood bank and Dr Saralyn Pilar and if it was higher, then everything would be ok and no further intervention with the blood would be needed.  The result came back 8.7 so she did not receive the transfusion.  She did, however, receive Faraheme.  Dr is aware that the order needs to be discontinued but no transfusion needed at this time.

## 2019-03-17 LAB — CULTURE, RESPIRATORY W GRAM STAIN: Culture: NORMAL

## 2019-03-17 LAB — CBC WITH DIFFERENTIAL/PLATELET
Abs Immature Granulocytes: 0.05 10*3/uL (ref 0.00–0.07)
Basophils Absolute: 0 10*3/uL (ref 0.0–0.1)
Basophils Relative: 0 %
Eosinophils Absolute: 0.7 10*3/uL — ABNORMAL HIGH (ref 0.0–0.5)
Eosinophils Relative: 7 %
HCT: 28.3 % — ABNORMAL LOW (ref 36.0–46.0)
Hemoglobin: 9 g/dL — ABNORMAL LOW (ref 12.0–15.0)
Immature Granulocytes: 1 %
Lymphocytes Relative: 27 %
Lymphs Abs: 2.6 10*3/uL (ref 0.7–4.0)
MCH: 23.8 pg — ABNORMAL LOW (ref 26.0–34.0)
MCHC: 31.8 g/dL (ref 30.0–36.0)
MCV: 74.9 fL — ABNORMAL LOW (ref 80.0–100.0)
Monocytes Absolute: 0.6 10*3/uL (ref 0.1–1.0)
Monocytes Relative: 6 %
Neutro Abs: 5.5 10*3/uL (ref 1.7–7.7)
Neutrophils Relative %: 59 %
Platelets: 281 10*3/uL (ref 150–400)
RBC: 3.78 MIL/uL — ABNORMAL LOW (ref 3.87–5.11)
RDW: 16.3 % — ABNORMAL HIGH (ref 11.5–15.5)
WBC: 9.4 10*3/uL (ref 4.0–10.5)
nRBC: 0 % (ref 0.0–0.2)

## 2019-03-17 LAB — BASIC METABOLIC PANEL
Anion gap: 10 (ref 5–15)
BUN: 14 mg/dL (ref 6–20)
CO2: 26 mmol/L (ref 22–32)
Calcium: 8.8 mg/dL — ABNORMAL LOW (ref 8.9–10.3)
Chloride: 101 mmol/L (ref 98–111)
Creatinine, Ser: 1.04 mg/dL — ABNORMAL HIGH (ref 0.44–1.00)
GFR calc Af Amer: >60 mL/min (ref >60)
GFR calc non Af Amer: 58 mL/min — ABNORMAL LOW (ref >60)
Glucose, Bld: 116 mg/dL — ABNORMAL HIGH (ref 70–99)
Potassium: 3.7 mmol/L (ref 3.5–5.1)
Sodium: 137 mmol/L (ref 135–145)

## 2019-03-17 LAB — HEMOGLOBIN A1C
Hgb A1c MFr Bld: 6.5 % — ABNORMAL HIGH (ref 4.8–5.6)
Mean Plasma Glucose: 139.85 mg/dL

## 2019-03-17 LAB — MAGNESIUM: Magnesium: 2.2 mg/dL (ref 1.7–2.4)

## 2019-03-17 MED ORDER — ONDANSETRON 4 MG PO TBDP: 4.0000 mg | ORAL_TABLET | Freq: Three times a day (TID) | ORAL | 0 refills | Status: DC | PRN

## 2019-03-17 MED ORDER — METHOCARBAMOL 500 MG PO TABS: 500.0000 mg | ORAL_TABLET | Freq: Four times a day (QID) | ORAL | 0 refills | Status: DC

## 2019-03-17 MED ORDER — OXYCODONE-ACETAMINOPHEN 5-325 MG PO TABS: 1.0000 | ORAL_TABLET | ORAL | 0 refills | Status: DC | PRN

## 2019-03-17 NOTE — Discharge Summary (Signed)
Physician Discharge Summary  Patient ID: Julie Ross MRN: [id number] DOB/AGE: Jun 13, 1961 58 y.o.  Admit date: 03/12/2019 Discharge date: 03/17/2019  Admission Diagnoses: Lumbar radiculopathy, Bilateral L4-5 facet synovial cysts    Discharge Diagnoses: home   Discharged Condition: good  Hospital Course: The patient was admitted on 03/12/2019 and taken to the operating room where the patient underwent TLIF L4-L5. The patient tolerated the procedure well and was taken to the recovery room and then to the floor in stable condition. The hospital course was routine. There were no complications. The wound remained clean dry and intact. Pt had appropriate back soreness. No complaints of leg pain or new N/T/W. The patient remained afebrile with stable vital signs, and tolerated a regular diet. The patient continued to increase activities, and pain was well controlled with oral pain medications.   Consults: None  Significant Diagnostic Studies:  Results for orders placed or performed during the hospital encounter of 03/12/19  Expectorated sputum assessment w rflx to resp cult   Specimen: Sputum  Result Value Ref Range   Specimen Description SPUTUM    Special Requests NONE    Sputum evaluation      THIS SPECIMEN IS ACCEPTABLE FOR SPUTUM CULTURE Performed at [facility], [place] [address], [place], [place] [address]    Report Status 03/15/2019 FINAL   Culture, blood (x 2)   Specimen: BLOOD RIGHT HAND  Result Value Ref Range   Specimen Description BLOOD RIGHT HAND    Special Requests      BOTTLES DRAWN AEROBIC ONLY Blood Culture adequate volume   Culture      NO GROWTH 2 DAYS Performed at [facility], [place] [address], [place], [place] [address]    Report Status PENDING   Culture, blood (x 2)   Specimen: BLOOD RIGHT FOREARM  Result Value Ref Range   Specimen Description BLOOD RIGHT FOREARM    Special Requests      BOTTLES DRAWN AEROBIC ONLY Blood Culture adequate volume    Culture      NO GROWTH 2 DAYS Performed at [facility], [place] [address], [place], [place] [address]    Report Status PENDING   Culture, respiratory   Specimen: SPU  Result Value Ref Range   Specimen Description SPUTUM    Special Requests NONE Reflexed from [id number]    Gram Stain      FEW WBC PRESENT, PREDOMINANTLY PMN FEW GRAM POSITIVE COCCI IN CLUSTERS RARE GRAM NEGATIVE RODS    Culture      CULTURE REINCUBATED FOR BETTER GROWTH Performed at [facility], [place] [address], [place], [place] [address]    Report Status PENDING   CBC  Result Value Ref Range   WBC 13.5 (H) 4.0 - 10.5 K/uL   RBC 4.04 3.87 - 5.11 MIL/uL   Hemoglobin 9.5 (L) 12.0 - 15.0 g/dL   HCT 30.9 (L) 36.0 - 46.0 %   MCV 76.5 (L) 80.0 - 100.0 fL   MCH 23.5 (L) 26.0 - 34.0 pg   MCHC 30.7 30.0 - 36.0 g/dL   RDW 16.0 (H) 11.5 - 15.5 %   Platelets 264 150 - 400 K/uL   nRBC 0.0 0.0 - 0.2 %  Renal function panel  Result Value Ref Range   Sodium 133 (L) 135 - 145 mmol/L   Potassium 3.9 3.5 - 5.1 mmol/L   Chloride 97 (L) 98 - 111 mmol/L   CO2 21 (L) 22 - 32 mmol/L  Glucose, Bld 178 (H) 70 - 99 mg/dL   BUN 25 (H) 6 - 20 mg/dL   Creatinine, Ser 1.37 (H) 0.44 - 1.00 mg/dL   Calcium 8.6 (L) 8.9 - 10.3 mg/dL   Phosphorus 3.6 2.5 - 4.6 mg/dL   Albumin 3.8 3.5 - 5.0 g/dL   GFR calc non Af Amer 42 (L) >60 mL/min   GFR calc Af Amer 48 (L) >60 mL/min   Anion gap 15 5 - 15  Hemoglobin and hematocrit, blood  Result Value Ref Range   Hemoglobin 9.4 (L) 12.0 - 15.0 g/dL   HCT 31.0 (L) 36.0 - 46.0 %  Lactic acid, plasma  Result Value Ref Range   Lactic Acid, Venous 2.2 (HH) 0.5 - 1.9 mmol/L  Lactic acid, plasma  Result Value Ref Range   Lactic Acid, Venous 2.3 (HH) 0.5 - 1.9 mmol/L  Procalcitonin  Result Value Ref Range   Procalcitonin 0.14 ng/mL  TSH  Result Value Ref Range   TSH 0.624 0.350 - 4.500 uIU/mL  Ferritin  Result Value Ref Range   Ferritin 31 11 - 307 ng/mL  Iron and TIBC  Result Value  Ref Range   Iron 8 (L) 28 - 170 ug/dL   TIBC 353 250 - 450 ug/dL   Saturation Ratios 2 (L) 10.4 - 31.8 %   UIBC 345 ug/dL  Basic metabolic panel  Result Value Ref Range   Sodium 137 135 - 145 mmol/L   Potassium 3.1 (L) 3.5 - 5.1 mmol/L   Chloride 104 98 - 111 mmol/L   CO2 24 22 - 32 mmol/L   Glucose, Bld 159 (H) 70 - 99 mg/dL   BUN 14 6 - 20 mg/dL   Creatinine, Ser 1.04 (H) 0.44 - 1.00 mg/dL   Calcium 8.2 (L) 8.9 - 10.3 mg/dL   GFR calc non Af Amer 58 (L) >60 mL/min   GFR calc Af Amer >60 >60 mL/min   Anion gap 9 5 - 15  CBC with Differential/Platelet  Result Value Ref Range   WBC 11.6 (H) 4.0 - 10.5 K/uL   RBC 3.30 (L) 3.87 - 5.11 MIL/uL   Hemoglobin 7.7 (L) 12.0 - 15.0 g/dL   HCT 25.2 (L) 36.0 - 46.0 %   MCV 76.4 (L) 80.0 - 100.0 fL   MCH 23.3 (L) 26.0 - 34.0 pg   MCHC 30.6 30.0 - 36.0 g/dL   RDW 16.3 (H) 11.5 - 15.5 %   Platelets 236 150 - 400 K/uL   nRBC 0.0 0.0 - 0.2 %   Neutrophils Relative % 62 %   Neutro Abs 7.2 1.7 - 7.7 K/uL   Lymphocytes Relative 26 %   Lymphs Abs 3.0 0.7 - 4.0 K/uL   Monocytes Relative 6 %   Monocytes Absolute 0.7 0.1 - 1.0 K/uL   Eosinophils Relative 5 %   Eosinophils Absolute 0.6 (H) 0.0 - 0.5 K/uL   Basophils Relative 0 %   Basophils Absolute 0.1 0.0 - 0.1 K/uL   Immature Granulocytes 1 %   Abs Immature Granulocytes 0.08 (H) 0.00 - 0.07 K/uL  Hemoglobin and hematocrit, blood  Result Value Ref Range   Hemoglobin 8.7 (L) 12.0 - 15.0 g/dL   HCT 27.2 (L) 36.0 - 46.0 %  Procalcitonin  Result Value Ref Range   Procalcitonin <0.10 ng/mL  CBC with Differential/Platelet  Result Value Ref Range   WBC 9.4 4.0 - 10.5 K/uL   RBC 3.78 (L) 3.87 - 5.11 MIL/uL   Hemoglobin 9.0 (  L) 12.0 - 15.0 g/dL   HCT 28.3 (L) 36.0 - 46.0 %   MCV 74.9 (L) 80.0 - 100.0 fL   MCH 23.8 (L) 26.0 - 34.0 pg   MCHC 31.8 30.0 - 36.0 g/dL   RDW 16.3 (H) 11.5 - 15.5 %   Platelets 281 150 - 400 K/uL   nRBC 0.0 0.0 - 0.2 %   Neutrophils Relative % 59 %   Neutro Abs  5.5 1.7 - 7.7 K/uL   Lymphocytes Relative 27 %   Lymphs Abs 2.6 0.7 - 4.0 K/uL   Monocytes Relative 6 %   Monocytes Absolute 0.6 0.1 - 1.0 K/uL   Eosinophils Relative 7 %   Eosinophils Absolute 0.7 (H) 0.0 - 0.5 K/uL   Basophils Relative 0 %   Basophils Absolute 0.0 0.0 - 0.1 K/uL   Immature Granulocytes 1 %   Abs Immature Granulocytes 0.05 0.00 - 0.07 K/uL  Basic metabolic panel  Result Value Ref Range   Sodium 137 135 - 145 mmol/L   Potassium 3.7 3.5 - 5.1 mmol/L   Chloride 101 98 - 111 mmol/L   CO2 26 22 - 32 mmol/L   Glucose, Bld 116 (H) 70 - 99 mg/dL   BUN 14 6 - 20 mg/dL   Creatinine, Ser 1.04 (H) 0.44 - 1.00 mg/dL   Calcium 8.8 (L) 8.9 - 10.3 mg/dL   GFR calc non Af Amer 58 (L) >60 mL/min   GFR calc Af Amer >60 >60 mL/min   Anion gap 10 5 - 15  Magnesium  Result Value Ref Range   Magnesium 2.2 1.7 - 2.4 mg/dL  Hemoglobin A1c  Result Value Ref Range   Hgb A1c MFr Bld 6.5 (H) 4.8 - 5.6 %   Mean Plasma Glucose 139.85 mg/dL  Type and screen [place]  Result Value Ref Range   ABO/RH(D) O POS    Antibody Screen NEG    Sample Expiration      03/19/2019,2359 Performed at [facility], [address], [place], [place] [address]   Prepare RBC  Result Value Ref Range   Order Confirmation      ORDER PROCESSED BY BLOOD BANK NO UNITS PREPARED SINCE HGB 7.7 AND NON EMERGENT. RN [facility] NOTIFIED AT [phone]. Performed at [facility], [place] [address] [place], [place] [address]   Troponin I (High Sensitivity)  Result Value Ref Range   Troponin I (High Sensitivity) 5 <18 ng/L   *Note: Due to a large number of results and/or encounters for the requested time period, some results have not been displayed. A complete set of results can be found in Results Review.    Dg Lumbar Spine 2-3 Views  Result Date: 03/12/2019 CLINICAL DATA:  58 year old female with a history of lumbar spine surgery EXAM: DG C-ARM 1-60 MIN; LUMBAR SPINE - 2-3 VIEW COMPARISON:   None. FINDINGS: Limited fluoroscopic spot images of the lumbar spine demonstrates posterior lumbar interbody fusion with bilateral pedicle screw and rod fixation of the L4-L5 level with disc spacer centered at the L4-L5 disc space. IMPRESSION: Limited intraoperative fluoroscopic spot images of posterior lumbar fusion of L4-L5, as above. Please refer to the dictated operative report for full details of intraoperative findings and procedure. Electronically Signed   By: Corrie Mckusick D.O.   On: 03/12/2019 12:34   Ct Angio Chest Pe W Or Wo Contrast  Result Date: 03/14/2019 CLINICAL DATA:  Recent spinal surgery. Cough and shortness of breath today.  EXAM: CT ANGIOGRAPHY CHEST WITH CONTRAST TECHNIQUE: Multidetector CT imaging of the chest was performed using the standard protocol during bolus administration of intravenous contrast. Multiplanar CT image reconstructions and MIPs were obtained to evaluate the vascular anatomy. CONTRAST:  62mL OMNIPAQUE IOHEXOL 350 MG/ML SOLN COMPARISON:  12/28/2012. FINDINGS: Cardiovascular: There is satisfactory opacification of the pulmonary arteries to the segmental level. There is no evidence of a pulmonary embolism. Heart is borderline enlarged. No pericardial effusion. Thoracic aorta is normal caliber. There is aortic atherosclerosis with no significant stenosis. No dissection. Mediastinum/Nodes: Thyroid is unremarkable. No neck base, axillary, mediastinal or hilar masses. No enlarged lymph nodes. Trachea is unremarkable. Esophagus is mildly distended. No wall thickening or inflammation. Small hiatal hernia. Lungs/Pleura: Bilateral, lower lobe, linear and peribronchovascular opacities. This may all be due to atelectasis. Consider a component of infection in the proper clinical setting. Small areas of peribronchovascular ground-glass opacity noted in the posteroinferior right upper lobe, also likely atelectasis. Remainder of the lungs is clear. No lung mass or nodule. No evidence of  pulmonary edema. No pleural effusion or pneumothorax. Upper Abdomen: No acute abnormality. Musculoskeletal: No fracture or acute finding. No osteoblastic or osteolytic lesions. Status post left mastectomy.  No chest wall masses. Review of the MIP images confirms the above findings. IMPRESSION: 1. No evidence of a pulmonary embolism. 2. Bilateral lower lobe linear and peribronchovascular opacities. This may all be atelectasis. Consider pneumonia if there are consistent clinical findings. Aortic Atherosclerosis (ICD10-I70.0). Electronically Signed   By: Lajean Manes M.D.   On: 03/14/2019 14:50   Ct Lumbar Spine Wo Contrast  Result Date: 03/05/2019 CLINICAL DATA:  Preoperative evaluation, surgery scheduled 03/26/2019 EXAM: CT LUMBAR SPINE WITHOUT CONTRAST TECHNIQUE: Multidetector CT imaging of the lumbar spine was performed without intravenous contrast administration. Multiplanar CT image reconstructions were also generated. COMPARISON:  Lumbar MRI January 21, 2019, lumbar MR 08/17/2018 FINDINGS: Segmentation: 5 lumbar type vertebral bodies are present. Alignment: Preservation of the normal lumbar lordosis. Mild stepwise anterolisthesis L4 on L5 and L5 on S1. Alignment is unchanged from comparison. Vertebrae: No fracture, concerning features of osseous infection or suspicious osseous lesions. Paraspinal and other soft tissues: No paravertebral soft tissue abnormality. Mild soft tissue edema posteriorly. Included portions of the abdomen and pelvis reveal atherosclerotic calcification of the aorta. Disc levels: Level by level evaluation of the lumbar spine below: T11-T12: Schmorl's node formation at the superior endplate of 624THL. No significant spinal canal stenosis or foraminal narrowing. T12-L1: No significant spinal canal stenosis or foraminal narrowing. L1-L2: Minimal global disc bulge. No significant spinal canal stenosis or foraminal narrowing. Mild facet degenerative changes. L2-L3: No significant disc  abnormality. Mild facet degenerative change. No significant spinal canal stenosis or foraminal narrowing. L3-L4: Minimal global disc bulge. Moderate facet arthropathy. No significant spinal canal stenosis or foraminal narrowing. L4-L5: Mild global disc bulge. Moderate to severe facet hypertrophic changes with bulging synovial thickening, cyst formation better seen on MR. Ligamentum flavum infolding is present. Findings result in moderate to severe canal stenosis, mild narrowing of the right lateral recess and mild bilateral foraminal narrowing. L5-S1: Mild global disc bulge. Moderate facet hypertrophic degenerative change. No significant spinal canal stenosis or foraminal narrowing. IMPRESSION: Mild disc bulge with moderate to severe facet hypertrophic changes, synovial thickening, and ligamentum flavum redundancy at L4-L5 resulting in moderate to severe canal stenosis, mild narrowing of the right lateral recess, and mild bilateral foraminal narrowing. Additional mild multilevel facet degenerative changes are present L1-S1. Aortic Atherosclerosis (ICD10-I70.0). Electronically Signed  By: Lovena Le M.D.   On: 03/05/2019 19:23   Dg Chest Port 1 View  Result Date: 03/14/2019 CLINICAL DATA:  Cough, congestion, shortness of breath EXAM: PORTABLE CHEST 1 VIEW COMPARISON:  10/20/2014 FINDINGS: Cardiomegaly. Both lungs are clear. The visualized skeletal structures are unremarkable. IMPRESSION: Cardiomegaly without acute abnormality of the lungs in AP portable projection. Electronically Signed   By: Eddie Candle M.D.   On: 03/14/2019 11:08   Dg C-arm 1-60 Min  Result Date: 03/12/2019 CLINICAL DATA:  58 year old female with a history of lumbar spine surgery EXAM: DG C-ARM 1-60 MIN; LUMBAR SPINE - 2-3 VIEW COMPARISON:  None. FINDINGS: Limited fluoroscopic spot images of the lumbar spine demonstrates posterior lumbar interbody fusion with bilateral pedicle screw and rod fixation of the L4-L5 level with disc spacer  centered at the L4-L5 disc space. IMPRESSION: Limited intraoperative fluoroscopic spot images of posterior lumbar fusion of L4-L5, as above. Please refer to the dictated operative report for full details of intraoperative findings and procedure. Electronically Signed   By: Corrie Mckusick D.O.   On: 03/12/2019 12:34    Antibiotics:  Anti-infectives (From admission, onward)   Start     Dose/Rate Route Frequency Ordered Stop   03/26/19 0600  ceFAZolin (ANCEF) IVPB 2g/100 mL premix     2 g 200 mL/hr over 30 Minutes Intravenous On call to O.R. 03/12/19 [id number] 03/12/19 1143   03/15/19 2000  vancomycin (VANCOCIN) IVPB 1000 mg/200 mL premix  Status:  Discontinued     1,000 mg 200 mL/hr over 60 Minutes Intravenous Every 24 hours 03/14/19 1736 03/16/19 1530   03/14/19 1800  vancomycin (VANCOCIN) 2,000 mg in sodium chloride 0.9 % 500 mL IVPB     2,000 mg 250 mL/hr over 120 Minutes Intravenous  Once 03/14/19 1654 03/14/19 2147   03/14/19 1800  ceFEPIme (MAXIPIME) 2 g in sodium chloride 0.9 % 100 mL IVPB  Status:  Discontinued     2 g 200 mL/hr over 30 Minutes Intravenous Every 12 hours 03/14/19 1654 03/16/19 1530   03/12/19 1945  ceFAZolin (ANCEF) IVPB 2g/100 mL premix     2 g 200 mL/hr over 30 Minutes Intravenous Every 8 hours 03/12/19 1620 03/13/19 0322   03/12/19 1630  ceFAZolin (ANCEF) IVPB 2g/100 mL premix  Status:  Discontinued     2 g 200 mL/hr over 30 Minutes Intravenous Every 8 hours 03/12/19 1619 03/12/19 1623   03/12/19 0828  bacitracin 50,000 Units in sodium chloride 0.9 % 500 mL irrigation  Status:  Discontinued       As needed 03/12/19 0829 03/12/19 1245   03/12/19 0638  ceFAZolin (ANCEF) 2-4 GM/100ML-% IVPB    Note to Pharmacy: Granville Lewis, [place]   : cabinet override      03/12/19 0638 03/12/19 0740      Discharge Exam: Blood pressure (!) 115/54, pulse 72, temperature 98.2 F (36.8 C), temperature source Oral, resp. rate 18, height 5\' 6"  (1.676 m), weight 104.3 kg, last menstrual period  12/13/2011, SpO2 91 %. Neurologic: Grossly normal Ambulating and voiding well, incision cdi  Discharge Medications:   Allergies as of 03/17/2019      Reactions   Bee Venom Anaphylaxis      Medication List    TAKE these medications   ALPRAZolam 0.5 MG tablet Commonly known as: XANAX Take 1 tablet (0.5 mg total) by mouth 3 (three) times daily as needed. for anxiety What changed:   when to take this  additional instructions   amLODipine  10 MG tablet Commonly known as: NORVASC TAKE 1 TABLET DAILY   atorvastatin 20 MG tablet Commonly known as: LIPITOR TAKE 1 TABLET DAILY   baclofen 10 MG tablet Commonly known as: LIORESAL TAKE 1 TABLET THREE TIMES A DAY AS NEEDED FOR MUSCLE SPASMS What changed: See the new instructions.   buPROPion 150 MG 24 hr tablet Commonly known as: WELLBUTRIN XL TAKE 1 TABLET DAILY   cetirizine 10 MG tablet Commonly known as: ZYRTEC TAKE 1 TABLET(10 MG) BY MOUTH DAILY What changed: See the new instructions.   EPINEPHrine 0.3 mg/0.3 mL Soaj injection Commonly known as: EPI-PEN Inject 0.3 mg into the muscle as needed for anaphylaxis.   gabapentin 600 MG tablet Commonly known as: NEURONTIN Take 600 mg by mouth 2 (two) times daily.   gabapentin 300 MG capsule Commonly known as: NEURONTIN Take 1-2 pills twice a day   HYDROcodone-acetaminophen 5-325 MG tablet Commonly known as: NORCO/VICODIN Take 1 tablet by mouth 2 (two) times daily as needed for pain.   Klor-Con M20 20 MEQ tablet Generic drug: potassium chloride SA TAKE 1 TABLET DAILY What changed: how much to take   letrozole 2.5 MG tablet Commonly known as: FEMARA Take 1 tablet (2.5 mg total) by mouth daily.   levothyroxine 88 MCG tablet Commonly known as: SYNTHROID TAKE 1 TABLET DAILY What changed: when to take this   losartan-hydrochlorothiazide 100-25 MG tablet Commonly known as: HYZAAR TAKE 1 TABLET EVERY MORNING BEFORE BREAKFAST What changed: See the new instructions.    methocarbamol 500 MG tablet Commonly known as: Robaxin Take 1 tablet (500 mg total) by mouth 4 (four) times daily.   omeprazole 40 MG capsule Commonly known as: PRILOSEC TAKE 1 CAPSULE DAILY   oxyCODONE-acetaminophen 5-325 MG tablet Commonly known as: Percocet Take 1 tablet by mouth every 4 (four) hours as needed for severe pain.   PROBIOTIC DAILY PO Take 1 capsule by mouth daily.   vitamin E 400 UNIT capsule Take 800 Units by mouth daily.       Disposition: home   Final Dx: TLIF L4-L5  Discharge Instructions    Face-to-face encounter (required for Medicare/Medicaid patients)   Complete by: As directed    I Judith Part certify that this patient is under my care and that I, or a nurse practitioner or physician's assistant working with me, had a face-to-face encounter that meets the physician face-to-face encounter requirements with this patient on 03/14/2019. The encounter with the patient was in whole, or in part for the following medical condition(s) which is the primary reason for home health care (List medical condition): lumbar radiculopathy   The encounter with the patient was in whole, or in part, for the following medical condition, which is the primary reason for home health care: lumbar radiculopathy   I certify that, based on my findings, the following services are medically necessary home health services: Physical therapy   Reason for Medically Necessary Home Health Services: Therapy- Personnel officer, Public librarian   My clinical findings support the need for the above services: Pain interferes with ambulation/mobility   Further, I certify that my clinical findings support that this patient is homebound due to: Pain interferes with ambulation/mobility   Home Health   Complete by: As directed    To provide the following care/treatments: OT         Signed: Ocie Cornfield Ly Bacchi 03/17/2019, 8:14 AM

## 2019-03-17 NOTE — Progress Notes (Signed)
Discharged to home after IV access removed and discharge instructions reviewed with pt.  All questions answered.  No signs of distress.

## 2019-03-17 NOTE — Progress Notes (Signed)
PT Cancellation Note  Patient Details Name: Julie Ross MRN: [id number] DOB: [birth year]   Cancelled Treatment:    Reason Eval/Treat Not Completed: Other (comment)  Patient waiting to be discharged. Fully dressed. States she is not sure what the hold-up is (noted MD wrote discharge summary early this morning). Reports she recently spoke to her nurse and she's working on it. Reports she has been up today and politely refused getting up with PT at this time.     Barry Brunner, PT     Rexanne Mano 03/17/2019, 5:13 PM

## 2019-03-17 NOTE — Progress Notes (Addendum)
Consult PROGRESS NOTE  Julie Ross [id number] DOB: 1960/10/25 DOA: 03/12/2019 PCP: Luetta Nutting, DO   Consulting attending physician: Dr. Joaquim Nam Reason for consult: Medical management  HPI/Recap of past 96 hours: 58 year old female with history of hypertension, hypothyroidism, multiple sclerosis, history of breast cancer, anxiety depression and anemia, obstructive sleep apnea not on CPAP who was admitted to the hospital with bilateral lower extremity radicular pain.  MRI showed L4-5 cyst with lateral recess stenosis and severe  Spondylolysis.  Patient underwent L4 laminectomy with left L4 and 5 open transforaminal lumbar interbody fusion on 9/22.  Early morning hours of 924, patient started having cough and shortness of breath and she was 82% on room air.  Patient was given multiple breathing treatments without improvement in symptoms.  Chest x-ray revealed cardiomegaly without sounds of edema or infiltrate.  Medicine consulted for hypoxia and shortness of breath.  03/17/19: Patient was seen and examined at bedside.  No acute events overnight.  States her cough is improving.  States she has a mild sore throat improved with Chloraseptic mouth spray.  Afebrile with no leukocytosis.  Procalcitonin negative less than 0.10.  No sign of lobular infiltrates on chest x-ray.  Benign physical exam.  Vital signs and labs reviewed and are stable.  We will sign off.  Home O2 evaluation was also done on 03/16/2019: SATURATION QUALIFICATIONS: (This note is used to comply with regulatory documentation for home oxygen)  Patient Saturations on Room Air at Rest =97%  Patient Saturations on Room Air while Ambulating = 99%  Patient Saturations on 0 Liters of oxygen while Ambulating = NA%   Assessment/Plan: Active Problems:   Lumbar radiculopathy  Resolved acute hypoxic respiratory failure in a postop patient History of untreated OSA Recommend pulmonology follow-up at discharge for untreated OSA.   Resolved leukocytosis with concern for H CAP, HCAP less likely. Completed 3 days of IV vancomycin and cefepime. Procalcitonin negative less than 0.10 Afebrile with no leukocytosis Chest x-ray benign no lobular infiltrates  Resolving acute blood loss anemia/chronic microcytic anemia/severe iron deficiency anemia Received 1 dose of IV Feraheme at 510 mg once  hemoglobin stable 9.0 on 03/17/2019 from 8.7 on 03/16/2019 without PRBC blood transfusion  Resolving AKI on CKD 3 with IV fluid hydration Creatinine back to baseline 1.0 with GFR 58 Continue to avoid nephrotoxins such as NSAIDs Follow-up with your PCP  Resolved hypokalemia post repletion Potassium 3.7 on 03/17/2019 from 3.1 on 03/16/2019  Hyperglycemia Hemoglobin A1c 6.5 on 03/17/2019 with fasting glucose of 116 Not on diabetic medications at home Follow-up with your PCP  Hypothyroidism, stable Continue levothyroxine  Essential hypertension, stable Continue home antihypertensives  Chronic anxiety/depression, stable Continue Wellbutrin and Ativan as needed  Lumbar radiculopathy status post surgery: per primary team.  Hyperlipidemia Continue Lipitor  OSA, not on CPAP Recommend pulmonology follow-up   Code Status: Full code    Disposition Plan: Anticipate DC to home possibly tomorrow 03/17/2019 with home health OT.   Procedures:   Lumbar surgery, 9/23  Antimicrobials:   Vancomycin, 03/14/2019>>03/16/19  Cefepime, 03/14/2019>>03/16/19   Thank you for allowing [place] to participate in the care of this patient.  We will sign off.  Objective: Vitals:   03/16/19 1540 03/16/19 1943 03/17/19 0043 03/17/19 0423  BP: (!) 123/53 (!) 131/54 (!) 107/49 (!) 115/54  Pulse: 72 76 71 72  Resp: 18 16 16 18   Temp: 98.7 F (37.1 C) 98.4 F (36.9 C) 98.5 F (36.9 C) 98.2 F (36.8 C)  TempSrc: Oral  Oral Oral Oral  SpO2: 95% 95% 91% 91%  Weight:      Height:        Intake/Output Summary (Last 24 hours) at 03/17/2019  0814 Last data filed at 03/16/2019 1700 Gross per 24 hour  Intake 819.9 ml  Output -  Net 819.9 ml   Filed Weights   03/12/19 [id number]  Weight: 104.3 kg    Exam:  . General: 58 y.o. year-old female developed well-nourished no acute distress.  Alert and oriented x3.   . Cardiovascular: Regular rate and rhythm no rubs or gallops no JVD or thyromegaly noted. Marland Kitchen Respiratory: Clear to auscultation no wheezes or rales.  Good inspiratory effort.. . Abdomen: Soft nontender nondistended normal bowel sounds present.  Marland Kitchen Psychiatry: Mood is appropriate for condition and setting.   Data Reviewed: CBC: Recent Labs  Lab 03/14/19 0840 03/14/19 1229 03/16/19 0328 03/16/19 1230 03/17/19 0400  WBC 13.5*  --  11.6*  --  9.4  NEUTROABS  --   --  7.2  --  5.5  HGB 9.5* 9.4* 7.7* 8.7* 9.0*  HCT 30.9* 31.0* 25.2* 27.2* 28.3*  MCV 76.5*  --  76.4*  --  74.9*  PLT 264  --  236  --  [id number]   Basic Metabolic Panel: Recent Labs  Lab 03/14/19 0840 03/16/19 0328 03/17/19 0400  NA 133* 137 137  K 3.9 3.1* 3.7  CL 97* 104 101  CO2 21* 24 26  GLUCOSE 178* 159* 116*  BUN 25* 14 14  CREATININE 1.37* 1.04* 1.04*  CALCIUM 8.6* 8.2* 8.8*  MG  --   --  2.2  PHOS 3.6  --   --    GFR: Estimated Creatinine Clearance: 70.2 mL/min (A) (by C-G formula based on SCr of 1.04 mg/dL (H)). Liver Function Tests: Recent Labs  Lab 03/14/19 0840  ALBUMIN 3.8   No results for input(s): LIPASE, AMYLASE in the last 168 hours. No results for input(s): AMMONIA in the last 168 hours. Coagulation Profile: No results for input(s): INR, PROTIME in the last 168 hours. Cardiac Enzymes: No results for input(s): CKTOTAL, CKMB, CKMBINDEX, TROPONINI in the last 168 hours. BNP (last 3 results) No results for input(s): PROBNP in the last 8760 hours. HbA1C: Recent Labs    03/17/19 0400  HGBA1C 6.5*   CBG: No results for input(s): GLUCAP in the last 168 hours. Lipid Profile: No results for input(s): CHOL, HDL, LDLCALC,  TRIG, CHOLHDL, LDLDIRECT in the last 72 hours. Thyroid Function Tests: Recent Labs    03/15/19 0358  TSH 0.624   Anemia Panel: Recent Labs    03/15/19 0358  FERRITIN 31  TIBC 353  IRON 8*   Urine analysis:    Component Value Date/Time   COLORURINE YELLOW 08/05/2013 [place] 08/05/2013 1209   LABSPEC 1.014 08/05/2013 1209   PHURINE 5.5 08/05/2013 1209   GLUCOSEU NEGATIVE 08/05/2013 1209   HGBUR NEGATIVE 08/05/2013 [place] 08/05/2013 [place] 08/05/2013 1209   PROTEINUR NEGATIVE 08/05/2013 1209   UROBILINOGEN 0.2 08/05/2013 1209   NITRITE NEGATIVE 08/05/2013 1209   LEUKOCYTESUR SMALL (A) 08/05/2013 1209   Sepsis Labs: @LABRCNTIP (procalcitonin:4,lacticidven:4)  ) Recent Results (from the past 240 hour(s))  Novel Coronavirus, NAA (Hosp order, Send-out to [facility]; TAT 18-24 hrs     Status: None   Collection Time: 03/08/19 11:51 AM   Specimen: Nasopharyngeal Swab; Respiratory  Result Value Ref Range Status   SARS-CoV-2, NAA NOT DETECTED  NOT DETECTED Final    Comment: (NOTE) This nucleic acid amplification test was developed and its performance characteristics determined by [employer]. Nucleic acid amplification tests include PCR and TMA. This test has not been FDA cleared or approved. This test has been authorized by FDA under an Emergency Use Authorization (EUA). This test is only authorized for the duration of time the declaration that circumstances exist justifying the authorization of the emergency use of in vitro diagnostic tests for detection of SARS-CoV-2 virus and/or diagnosis of COVID-19 infection under section 564(b)(1) of the Act, 21 U.S.C. [id number]) (1), unless the authorization is terminated or revoked sooner. When diagnostic testing is negative, the possibility of a false negative result should be considered in the context of a patient's recent exposures and the presence of clinical signs  and symptoms consistent with COVID-19. An individual without symptoms of COVID- 19 and who is not shedding SARS-CoV-2 vi rus would expect to have a negative (not detected) result in this assay. Performed At: [facility] [address] [place], [place] [id number] Rush Farmer MD [id number]    [place]  Final    Comment: Performed at [facility], [place] [address], [place], [place] [address]  Culture, blood (x 2)     Status: None (Preliminary result)   Collection Time: 03/14/19  5:50 PM   Specimen: BLOOD RIGHT HAND  Result Value Ref Range Status   Specimen Description BLOOD RIGHT HAND  Final   Special Requests   Final    BOTTLES DRAWN AEROBIC ONLY Blood Culture adequate volume   Culture   Final    NO GROWTH 2 DAYS Performed at [facility], [place] [address], [place], [place] [address]    Report Status PENDING  Incomplete  Culture, blood (x 2)     Status: None (Preliminary result)   Collection Time: 03/14/19  6:06 PM   Specimen: BLOOD RIGHT FOREARM  Result Value Ref Range Status   Specimen Description BLOOD RIGHT FOREARM  Final   Special Requests   Final    BOTTLES DRAWN AEROBIC ONLY Blood Culture adequate volume   Culture   Final    NO GROWTH 2 DAYS Performed at [facility], [place] [address] [place], [place] [address]    Report Status PENDING  Incomplete  Expectorated sputum assessment w rflx to resp cult     Status: None   Collection Time: 03/15/19  7:23 AM   Specimen: Sputum  Result Value Ref Range Status   Specimen Description SPUTUM  Final   Special Requests NONE  Final   Sputum evaluation   Final    THIS SPECIMEN IS ACCEPTABLE FOR SPUTUM CULTURE Performed at [facility], [place] [address], [place], [place] [address]    Report Status 03/15/2019 FINAL  Final  Culture, respiratory     Status: None (Preliminary result)   Collection Time: 03/15/19  7:23 AM   Specimen: SPU  Result Value Ref Range Status   Specimen  Description SPUTUM  Final   Special Requests NONE Reflexed from [id number]  Final   Gram Stain   Final    FEW WBC PRESENT, PREDOMINANTLY PMN FEW GRAM POSITIVE COCCI IN CLUSTERS RARE GRAM NEGATIVE RODS    Culture   Final    CULTURE REINCUBATED FOR BETTER GROWTH Performed at [facility], [place] [address] [place], [place] [address]    Report Status PENDING  Incomplete      Studies: No results  found.  Scheduled Meds: . sodium chloride   Intravenous Once  . amLODipine  10 mg Oral Daily  . atorvastatin  20 mg Oral q1800  . buPROPion  150 mg Oral Daily  . docusate sodium  100 mg Oral BID  . ferrous sulfate  325 mg Oral Q breakfast  . gabapentin  600 mg Oral BID  . guaiFENesin  1,200 mg Oral BID  . losartan  100 mg Oral Daily   And  . hydrochlorothiazide  25 mg Oral Daily  . letrozole  2.5 mg Oral Daily  . levothyroxine  88 mcg Oral QAC breakfast  . pantoprazole  40 mg Oral Daily  . potassium chloride SA  20 mEq Oral Daily  . sodium chloride flush  3 mL Intravenous Q12H    Continuous Infusions: . sodium chloride       LOS: 5 days     Kayleen Memos, MD [facility] Pager [phone]  If 7PM-7AM, please contact night-coverage [email or web address] Password [email or web address] 03/17/2019, 8:14 AM

## 2019-03-18 ENCOUNTER — Encounter (HOSPITAL_COMMUNITY): Payer: Self-pay | Admitting: Neurological Surgery

## 2019-03-19 LAB — CULTURE, BLOOD (ROUTINE X 2)
Culture: NO GROWTH
Culture: NO GROWTH
Special Requests: ADEQUATE
Special Requests: ADEQUATE

## 2019-05-06 ENCOUNTER — Other Ambulatory Visit: Payer: Self-pay

## 2019-05-06 ENCOUNTER — Ambulatory Visit: Payer: Commercial Managed Care - PPO | Attending: Neurological Surgery | Admitting: Physical Therapy

## 2019-05-06 ENCOUNTER — Encounter: Payer: Self-pay | Admitting: Physical Therapy

## 2019-05-06 DIAGNOSIS — R262 Difficulty in walking, not elsewhere classified: Secondary | ICD-10-CM | POA: Diagnosis present

## 2019-05-06 DIAGNOSIS — M62830 Muscle spasm of back: Secondary | ICD-10-CM | POA: Insufficient documentation

## 2019-05-06 DIAGNOSIS — M545 Low back pain, unspecified: Secondary | ICD-10-CM

## 2019-05-06 NOTE — Therapy (Signed)
[place], [place], [address] Phone: [phone]   Fax:  [phone]  Physical Therapy Evaluation  Patient Details  Name: Julie Ross MRN: [id number] Date of Birth: Jul 28, 1960 Referring Provider (PT): Ostergard   Encounter Date: 05/06/2019  PT End of Session - 05/06/19 1603    Visit Number  1    Date for PT Re-Evaluation  07/06/19    PT Start Time  1527    PT Stop Time  [id number]    PT Time Calculation (min)  54 min    Activity Tolerance  Patient tolerated treatment well;Patient limited by pain       Past Medical History:  Diagnosis Date  . Anemia    h/o - ? bld. transfusion - more recent- ?2013  . Anxiety    uses xanax mainly for sleep  . Arthritis    knees   . Breast cancer ([facility]) 11/2011   lul/ER+PR+, x1 lymph node   . Cancer ([facility])    squamous cell skin cancer x7  . Depression   . GERD (gastroesophageal reflux disease)   . H/O bone density study 03/2011  . H/O colonoscopy   . H/O echocardiogram    last one 02/2013, due to chemotherapy  . Heart burn   . Hx of radiation therapy 04/17/12- 06/04/12   left chest wall, axilla, L supraclavicular fossa 4500 cGy, left mastectomy scar/chest wall 5940 cGy  . Hypertension   . Hypothyroidism   . Multiple sclerosis ([place] [facility])   . Neuromuscular disorder ([place])    MS TX WITH CYMBALTA   . Personal history of chemotherapy   . Personal history of radiation therapy   . Sleep apnea    MILD SLEEP APNEA, NO (Needed) MACHINE  6 YRS AGO HPT [facility]   . Wears glasses     Past Surgical History:  Procedure Laterality Date  . APPLICATION OF ROBOTIC ASSISTANCE FOR SPINAL PROCEDURE N/A 03/12/2019   Procedure: APPLICATION OF ROBOTIC ASSISTANCE FOR SPINAL PROCEDURE;  Surgeon: Judith Part, MD;  Location: [facility];  Service: Neurosurgery;  Laterality: N/A;  . CESAREAN SECTION     X2   . COLONOSCOPY    . ESOPHAGOGASTRODUODENOSCOPY    . FLEXIBLE SIGMOIDOSCOPY     . I&D KNEE WITH POLY EXCHANGE  03/02/2012   Procedure: IRRIGATION AND DEBRIDEMENT KNEE WITH POLY EXCHANGE;  Surgeon: Alta Corning, MD;  Location: [facility];  Service: Orthopedics;  Laterality: Left;  I&D of total knee with Possible Poly Exchange   . JOINT REPLACEMENT  Feb 2013   left knee- multiple surgeries   . KNEE ARTHROSCOPY     LT KNEE 04/2011  . KNEE ARTHROSCOPY  84   rt  . MASTECTOMY    . MASTECTOMY W/ SENTINEL NODE BIOPSY  12/19/2011   Procedure: MASTECTOMY WITH SENTINEL LYMPH NODE BIOPSY;  Surgeon: Haywood Lasso, MD;  Location: [facility];  Service: General;  Laterality: Left;  left breast and left axilla   . MOHS SURGERY     right face  . NO PAST SURGERIES     BLADDER SLING  4-5 YRS AGO   . PORT-A-CATH REMOVAL  03/06/2012   Procedure: REMOVAL PORT-A-CATH;  Surgeon: Odis Hollingshead, MD;  Location: [facility];  Service: General;  Laterality: N/A;  . PORT-A-CATH REMOVAL Right 08/22/2012   Procedure: REMOVAL PORT-A-CATH;  Surgeon: Edward Jolly, MD;  Location: [facility];  Service: General;  Laterality: Right;  . PORTACATH  PLACEMENT  01/16/2012   Procedure: INSERTION PORT-A-CATH;  Surgeon: Haywood Lasso, MD;  Location: [facility];  Service: General;  Laterality: Right;  Mulvane Cath Placement   . PORTACATH PLACEMENT  05/01/2012   Procedure: INSERTION PORT-A-CATH;  Surgeon: Haywood Lasso, MD;  Location: [facility];  Service: General;  Laterality: Right;  right internal jugular port-a-cath insertion  . TEE WITHOUT CARDIOVERSION  03/06/2012   Procedure: TRANSESOPHAGEAL ECHOCARDIOGRAM (TEE);  Surgeon: Josue Hector, MD;  Location: [facility];  Service: Cardiovascular;  Laterality: N/A;  Rm. [id number]  . TONSILLECTOMY    . TOTAL KNEE ARTHROPLASTY  08/15/2011   Procedure: TOTAL KNEE ARTHROPLASTY; lft Surgeon: Alta Corning, MD;  Location: [facility];  Service: Orthopedics;  Laterality: Left;  RIGHT KNEE CORTIZONE INJECTION  . TOTAL KNEE ARTHROPLASTY Left 08/18/2012    Procedure:  Irrigation and debridement of LEFT total knee;  Removal of total knee parts; Implant of spacers;  Surgeon: Kerin Salen, MD;  Location: [facility];  Service: Orthopedics;  Laterality: Left;  . TOTAL KNEE ARTHROPLASTY Right 08/12/2013  . TOTAL KNEE ARTHROPLASTY Right 08/12/2013   Procedure: RIGHT TOTAL KNEE ARTHROPLASTY;  Surgeon: Kerin Salen, MD;  Location: [facility];  Service: Orthopedics;  Laterality: Right;  . TOTAL KNEE REVISION Left 04/19/2013   Procedure: TOTAL KNEE REVISION AND REMOVAL CEMENT SPACER;  Surgeon: Kerin Salen, MD;  Location: [facility];  Service: Orthopedics;  Laterality: Left;  . TRANSFORAMINAL LUMBAR INTERBODY FUSION (TLIF) WITH PEDICLE SCREW FIXATION 1 LEVEL Bilateral 03/12/2019   Procedure: Lumbar four-five Open transforaminal lumbar interbody fusion with Lumbar four-five laminectomy, bilateral Lumbar four-five Pedicle screw placement;  Surgeon: Judith Part, MD;  Location: [facility];  Service: Neurosurgery;  Laterality: Bilateral;  . TUMOR REMOVAL     FROM [facility] AGE 25    There were no vitals filed for this visit.   Subjective Assessment - 05/06/19 1532    Subjective  Patient underwent a lumbar fusion at L4-5 on 03/12/19.  She reports that she was having LBP for a number of years after the multiple knee surgeries.  She reports that the pain was much worse over the past year.  She had complications and stayed in the hospital for about 5 nights, she had difficulty waking up after the pain meds and the anesthesia.    Pertinent History  mastectomy, lumbar fusion, bilateral TKA.    Limitations  Standing;Walking    How long can you stand comfortably?  <5 minutes    How long can you walk comfortably?  150 feet then needs to rest    Patient Stated Goals  be stronger, stand longer, walk farther, have less pain    Currently in Pain?  Yes    Pain Score  2     Pain Location  Back    Pain Orientation  Left;Right;Mid;Lower    Pain Descriptors / Indicators  Aching;Spasm     Pain Type  Acute pain;Surgical pain    Pain Radiating Towards  some tightness in the right buttock with some c/o pain    Pain Onset  More than a month ago    Pain Frequency  Intermittent    Aggravating Factors   standing, walking, housework, pain up to 8/10    Pain Relieving Factors  rest, some pain meds pain can be 2-3/10    Effect of Pain on Daily Activities  limits all ADL's         [facility] PT Assessment -  05/06/19 0001      Assessment   Medical Diagnosis  s/p lumbar fusion L4-5    Referring Provider (PT)  Ostergard    Onset Date/Surgical Date  03/12/19      Precautions   Precautions  None      Balance Screen   Has the patient fallen in the past 6 months  Yes    How many times?  1    Has the patient had a decrease in activity level because of a fear of falling?   No    Is the patient reluctant to leave their home because of a fear of falling?   No      Home Environment   Additional Comments  has stairs bedroom up stairs, does housework, has a 9 month old grandchild      Prior Function   Level of Independence  Independent    Vocation  On disability    Vocation Requirements  has MS    Leisure  prior to covid was doing some chair yoga      ROM / Strength   AROM / PROM / Strength  AROM;Strength      AROM   Overall AROM Comments  lumbar ROM decreased 50% with pain in the low back      Strength   Overall Strength Comments  4-/5 for the LE's with some back discomfort      Palpation   Palpation comment  she is tight and tender in the low back, buttocks and mid back      Transfers   Comments  very difficult and seemed to be in pain with supine to sit and back      Ambulation/Gait   Gait Comments  gait is without device, slow, and antalgic on the left, she does describe 5 surgeries on the left knee in the past                Objective measurements completed on examination: See above findings.      [facility] Adult PT Treatment/Exercise - 05/06/19 0001       Modalities   Modalities  Electrical Stimulation;Moist Heat      Moist Heat Therapy   Number Minutes Moist Heat  15 Minutes    Moist Heat Location  Lumbar Spine      Electrical Stimulation   Electrical Stimulation Location  lumbar area    Electrical Stimulation Action  IFC    Electrical Stimulation Parameters  supine    Electrical Stimulation Goals  Pain               PT Short Term Goals - 05/06/19 1610      PT SHORT TERM GOAL #1   Title  indepednent iwth initial HEP    Time  2    Period  Weeks    Status  New        PT Long Term Goals - 05/06/19 1611      PT LONG TERM GOAL #1   Title  understand posture and body mechanics    Time  8    Period  Weeks    Status  New      PT LONG TERM GOAL #2   Title  increase lumbar ROM 25%    Time  8    Period  Weeks    Status  New      PT LONG TERM GOAL #3   Title  tolerate standing 15 minutes to cook a meal  Time  8    Period  Weeks    Status  New      PT LONG TERM GOAL #4   Title  walk 600 feet without rest    Time  8    Period  Weeks    Status  New      PT LONG TERM GOAL #5   Title  decrease pain 25%    Time  8    Period  Weeks    Status  New             Plan - 05/06/19 1604    Clinical Impression Statement  Patient underwent a lumbar fusion at L4-5 on 03/12/19, reports that she may have compensated over the years due to the left knee issues.  She reports worse pain over the psat year, she reports some medicine complications after the surgery requiring her to stay multiple nights in the hospital.  She has limited ROM, she is very tight in the lumbar mms and into the buttocks especialy the right HS.  LE strength is 4-/5 with some back pain.  Tight HS and piriformis on the right    Personal Factors and Comorbidities  Comorbidity 3+    Comorbidities  MS, mastectomy, bilateral TKA's, lumbar surgery, reports complications in the left TKA requiring revisions    Stability/Clinical Decision Making   Evolving/Moderate complexity    Clinical Decision Making  Moderate    Rehab Potential  Good    PT Frequency  2x / week    PT Duration  8 weeks    PT Treatment/Interventions  ADLs/Self Care Home Management;Cryotherapy;Electrical Stimulation;Moist Heat;Stair training;Functional mobility training;Neuromuscular re-education;Gait training;Balance training;Therapeutic exercise;Therapeutic activities;Patient/family education;Manual techniques    PT Next Visit Plan  slowly start exercises for lumbar function, stability    Consulted and Agree with Plan of Care  Patient       Patient will benefit from skilled therapeutic intervention in order to improve the following deficits and impairments:  Abnormal gait, Pain, Improper body mechanics, Postural dysfunction, Increased muscle spasms, Decreased mobility, Decreased activity tolerance, Decreased endurance, Decreased range of motion, Decreased strength, Difficulty walking, Impaired flexibility  Visit Diagnosis: Acute bilateral low back pain without sciatica - Plan: PT plan of care cert/re-cert  Muscle spasm of back - Plan: PT plan of care cert/re-cert  Difficulty in walking, not elsewhere classified - Plan: PT plan of care cert/re-cert     Problem List Patient Active Problem List   Diagnosis Date Noted  . Lumbar radiculopathy 03/12/2019  . Spondylolisthesis at L4-L5 level 08/24/2018  . Facet syndrome, lumbar 08/24/2018  . Chronic SI joint pain 03/30/2018  . Delayed sleep phase syndrome 03/30/2018  . History of optic neuritis 07/19/2016  . Hyperlipidemia 06/18/2015  . H/O malignant neoplasm of breast 02/18/2015  . Gonalgia 02/18/2015  . Relapsing remitting multiple sclerosis ([facility]) 02/18/2015  . Well adult exam 02/02/2015  . Abdominal pain, generalized 02/02/2015  . Physical exam 12/17/2014  . High risk medication use 10/15/2014  . Anxiety state 10/15/2014  . Other fatigue 10/15/2014  . Obstructive sleep apnea 10/15/2014  . Restless leg  syndrome 10/15/2014  . Allergic rhinitis 09/30/2014  . Pain in joint, shoulder region 10/14/2013  . Memory loss 10/14/2013  . Constipation 08/30/2013  . Depression 08/30/2013  . S/P total knee arthroplasty 08/23/2013  . Pain of right leg 08/23/2013  . Constipation due to pain medication 07/19/2013  . Hearing loss secondary to cerumen impaction 07/19/2013  . 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 Hypothyroidism 05/29/2013  . Esophageal reflux 05/29/2013  . Acquired absence of knee joint following removal of joint prosthesis with presence of antibiotic-impregnated cement spacer Left 04/19/2013  . Hx of radiation therapy   . Essential hypertension, benign 06/11/2012  . Multiple sclerosis ([facility]) 01/23/2012  . Breast cancer of upper-outer quadrant of right female breast ([facility]) 11/22/2011  . Osteoarthritis of both knees 08/15/2011    [place]ner Boast., PT 05/06/2019, 5:00 PM  [place], [place], [address] Phone: [phone]   Fax:  [phone]  Name: SHONDI LEONELLI MRN: [id number] Date of Birth: 15-Dec-1960

## 2019-05-06 NOTE — Patient Instructions (Signed)
Access Code: [email or web address]  URL: [email or web address]  Date: 05/06/2019  Prepared by: Lum Babe   Exercises Hooklying Single Knee to Chest - 10 reps - 1 sets - 5 hold - 2x daily - 7x weekly Supine Double Knee to Chest - 10 reps - 1 sets - 5 hold - 2x daily - 7x weekly Supine Lower Trunk Rotation - 10 reps - 1 sets - 5 hold - 2x daily - 7x weekly Supine Piriformis Stretch Pulling Heel to Hip - 5 reps - 1 sets - 15 hold - 2x daily - 7x weekly

## 2019-05-14 ENCOUNTER — Ambulatory Visit: Payer: Commercial Managed Care - PPO | Admitting: Physical Therapy

## 2019-05-15 ENCOUNTER — Encounter: Payer: Commercial Managed Care - PPO | Admitting: Physical Therapy

## 2019-05-15 ENCOUNTER — Telehealth: Payer: Self-pay | Admitting: Physical Therapy

## 2019-05-15 NOTE — Telephone Encounter (Signed)
Left message regarding missed PT appointment today.  REminded of next visit and requested a call if unable to attend per our attendance policy  Jeral Pinch, PT 05/15/19 11:09 AM

## 2019-05-21 ENCOUNTER — Other Ambulatory Visit: Payer: Self-pay

## 2019-05-21 ENCOUNTER — Ambulatory Visit: Payer: Commercial Managed Care - PPO | Attending: Neurological Surgery | Admitting: Physical Therapy

## 2019-05-21 DIAGNOSIS — M62830 Muscle spasm of back: Secondary | ICD-10-CM | POA: Diagnosis present

## 2019-05-21 DIAGNOSIS — R262 Difficulty in walking, not elsewhere classified: Secondary | ICD-10-CM | POA: Insufficient documentation

## 2019-05-21 DIAGNOSIS — M545 Low back pain, unspecified: Secondary | ICD-10-CM

## 2019-05-21 DIAGNOSIS — M25562 Pain in left knee: Secondary | ICD-10-CM | POA: Diagnosis present

## 2019-05-21 NOTE — Therapy (Signed)
[place], [place], [address] Phone: [phone]   Fax:  [phone]  Physical Therapy Treatment  Patient Details  Name: Julie Ross MRN: [id number] Date of Birth: 26-Jun-1960 Referring Provider (PT): Ostergard   Encounter Date: 05/21/2019  PT End of Session - 05/21/19 1143    Visit Number  2    Date for PT Re-Evaluation  07/06/19    PT Start Time  1100    PT Stop Time  1150    PT Time Calculation (min)  50 min       Past Medical History:  Diagnosis Date  . Anemia    h/o - ? bld. transfusion - more recent- ?2013  . Anxiety    uses xanax mainly for sleep  . Arthritis    knees   . Breast cancer ([place]) 11/2011   lul/ER+PR+, x1 lymph node   . Cancer (HCC)    squamous cell skin cancer x7  . Depression   . GERD (gastroesophageal reflux disease)   . H/O bone density study 03/2011  . H/O colonoscopy   . H/O echocardiogram    last one 02/2013, due to chemotherapy  . Heart burn   . Hx of radiation therapy 04/17/12- 06/04/12   left chest wall, axilla, L supraclavicular fossa 4500 cGy, left mastectomy scar/chest wall 5940 cGy  . Hypertension   . Hypothyroidism   . Multiple sclerosis ([place])   . Neuromuscular disorder ([facility])    MS TX WITH CYMBALTA   . Personal history of chemotherapy   . Personal history of radiation therapy   . Sleep apnea    MILD SLEEP APNEA, NO (Needed) MACHINE  6 YRS AGO HPT REGIONAL   . Wears glasses     Past Surgical History:  Procedure Laterality Date  . APPLICATION OF ROBOTIC ASSISTANCE FOR SPINAL PROCEDURE N/A 03/12/2019   Procedure: APPLICATION OF ROBOTIC ASSISTANCE FOR SPINAL PROCEDURE;  Surgeon: Judith Part, MD;  Location: [facility];  Service: Neurosurgery;  Laterality: N/A;  . CESAREAN SECTION     X2   . COLONOSCOPY    . ESOPHAGOGASTRODUODENOSCOPY    . FLEXIBLE SIGMOIDOSCOPY    . I&D KNEE WITH POLY EXCHANGE  03/02/2012   Procedure: IRRIGATION AND  DEBRIDEMENT KNEE WITH POLY EXCHANGE;  Surgeon: Alta Corning, MD;  Location: [facility];  Service: Orthopedics;  Laterality: Left;  I&D of total knee with Possible Poly Exchange   . JOINT REPLACEMENT  Feb 2013   left knee- multiple surgeries   . KNEE ARTHROSCOPY     LT KNEE 04/2011  . KNEE ARTHROSCOPY  84   rt  . MASTECTOMY    . MASTECTOMY W/ SENTINEL NODE BIOPSY  12/19/2011   Procedure: MASTECTOMY WITH SENTINEL LYMPH NODE BIOPSY;  Surgeon: Haywood Lasso, MD;  Location: [facility];  Service: General;  Laterality: Left;  left breast and left axilla   . MOHS SURGERY     right face  . NO PAST SURGERIES     BLADDER SLING  4-5 YRS AGO   . PORT-A-CATH REMOVAL  03/06/2012   Procedure: REMOVAL PORT-A-CATH;  Surgeon: Odis Hollingshead, MD;  Location: [facility];  Service: General;  Laterality: N/A;  . PORT-A-CATH REMOVAL Right 08/22/2012   Procedure: REMOVAL PORT-A-CATH;  Surgeon: Edward Jolly, MD;  Location: [facility];  Service: General;  Laterality: Right;  . PORTACATH PLACEMENT  01/16/2012   Procedure: INSERTION PORT-A-CATH;  Surgeon: Haywood Lasso,  MD;  Location: [facility];  Service: General;  Laterality: Right;  [place] Cath Placement   . PORTACATH PLACEMENT  05/01/2012   Procedure: INSERTION PORT-A-CATH;  Surgeon: Haywood Lasso, MD;  Location: [facility];  Service: General;  Laterality: Right;  right internal jugular port-a-cath insertion  . TEE WITHOUT CARDIOVERSION  03/06/2012   Procedure: TRANSESOPHAGEAL ECHOCARDIOGRAM (TEE);  Surgeon: Josue Hector, MD;  Location: [facility];  Service: Cardiovascular;  Laterality: N/A;  Rm. 2927  . TONSILLECTOMY    . TOTAL KNEE ARTHROPLASTY  08/15/2011   Procedure: TOTAL KNEE ARTHROPLASTY; lft Surgeon: Alta Corning, MD;  Location: [facility];  Service: Orthopedics;  Laterality: Left;  RIGHT KNEE CORTIZONE INJECTION  . TOTAL KNEE ARTHROPLASTY Left 08/18/2012   Procedure:  Irrigation and debridement of LEFT total knee;  Removal of  total knee parts; Implant of spacers;  Surgeon: Kerin Salen, MD;  Location: [facility];  Service: Orthopedics;  Laterality: Left;  . TOTAL KNEE ARTHROPLASTY Right 08/12/2013  . TOTAL KNEE ARTHROPLASTY Right 08/12/2013   Procedure: RIGHT TOTAL KNEE ARTHROPLASTY;  Surgeon: Kerin Salen, MD;  Location: [facility];  Service: Orthopedics;  Laterality: Right;  . TOTAL KNEE REVISION Left 04/19/2013   Procedure: TOTAL KNEE REVISION AND REMOVAL CEMENT SPACER;  Surgeon: Kerin Salen, MD;  Location: [facility];  Service: Orthopedics;  Laterality: Left;  . TRANSFORAMINAL LUMBAR INTERBODY FUSION (TLIF) WITH PEDICLE SCREW FIXATION 1 LEVEL Bilateral 03/12/2019   Procedure: Lumbar four-five Open transforaminal lumbar interbody fusion with Lumbar four-five laminectomy, bilateral Lumbar four-five Pedicle screw placement;  Surgeon: Judith Part, MD;  Location: [facility];  Service: Neurosurgery;  Laterality: Bilateral;  . TUMOR REMOVAL     FROM PELVIS AGE 83    There were no vitals filed for this visit.  Subjective Assessment - 05/21/19 1101    Subjective  pain progressively gets worse as day goes on and with activity. steps are very difficult. doing HEP    Currently in Pain?  Yes    Pain Score  4     Pain Location  Back    Pain Orientation  Lower                       [facility] Adult PT Treatment/Exercise - 05/21/19 0001      Exercises   Exercises  Lumbar;Knee/Hip      Lumbar Exercises: Aerobic   Nustep  L 3 5 min      Lumbar Exercises: Seated   Other Seated Lumbar Exercises  pelvic ROM on sit fit 10 reps with cuing   row and shld ext 10 reps red tband on sit fit   Other Seated Lumbar Exercises  LAQ,hip flex, hip abd and HS curl red tband on sit fit 10 reps each      Lumbar Exercises: Supine   Ab Set  10 reps;3 seconds   with ball   Bridge  Non-compliant;10 reps;3 seconds   feet on ball   Bridge Limitations  KTC and obl with ball 10 each      Modalities   Modalities  Electrical  Stimulation;Moist Heat      Moist Heat Therapy   Number Minutes Moist Heat  15 Minutes    Moist Heat Location  Lumbar Spine      Electrical Stimulation   Electrical Stimulation Location  lumbar area   low buttock   Electrical Stimulation Action  IFC    Electrical Stimulation  Parameters  supine    Electrical Stimulation Goals  Pain      Manual Therapy   Manual Therapy  Passive ROM    Passive ROM  LE and trunk               PT Short Term Goals - 05/21/19 1145      PT SHORT TERM GOAL #1   Title  indepednent iwth initial HEP    Status  Achieved        PT Long Term Goals - 05/06/19 1611      PT LONG TERM GOAL #1   Title  understand posture and body mechanics    Time  8    Period  Weeks    Status  New      PT LONG TERM GOAL #2   Title  increase lumbar ROM 25%    Time  8    Period  Weeks    Status  New      PT LONG TERM GOAL #3   Title  tolerate standing 15 minutes to cook a meal    Time  8    Period  Weeks    Status  New      PT LONG TERM GOAL #4   Title  walk 600 feet without rest    Time  8    Period  Weeks    Status  New      PT LONG TERM GOAL #5   Title  decrease pain 25%    Time  8    Period  Weeks    Status  New            Plan - 05/21/19 1143    Clinical Impression Statement  first session after eval as she had been sick and had to cancel a few. pt verb doing HEP. pt tolerated initail progression of ex well but did need verb and tactile cuing.    PT Treatment/Interventions  ADLs/Self Care Home Management;Cryotherapy;Electrical Stimulation;Moist Heat;Stair training;Functional mobility training;Neuromuscular re-education;Gait training;Balance training;Therapeutic exercise;Therapeutic activities;Patient/family education;Manual techniques    PT Next Visit Plan  assess and progresst exercises for lumbar function, stability       Patient will benefit from skilled therapeutic intervention in order to improve the following deficits and  impairments:  Abnormal gait, Pain, Improper body mechanics, Postural dysfunction, Increased muscle spasms, Decreased mobility, Decreased activity tolerance, Decreased endurance, Decreased range of motion, Decreased strength, Difficulty walking, Impaired flexibility  Visit Diagnosis: Acute bilateral low back pain without sciatica  Muscle spasm of back  Difficulty in walking, not elsewhere classified     Problem List Patient Active Problem List   Diagnosis Date Noted  . Lumbar radiculopathy 03/12/2019  . Spondylolisthesis at L4-L5 level 08/24/2018  . Facet syndrome, lumbar 08/24/2018  . Chronic SI joint pain 03/30/2018  . Delayed sleep phase syndrome 03/30/2018  . History of optic neuritis 07/19/2016  . Hyperlipidemia 06/18/2015  . H/O malignant neoplasm of breast 02/18/2015  . Gonalgia 02/18/2015  . Relapsing remitting multiple sclerosis ([place]) 02/18/2015  . Well adult exam 02/02/2015  . Abdominal pain, generalized 02/02/2015  . Physical exam 12/17/2014  . High risk medication use 10/15/2014  . Anxiety state 10/15/2014  . Other fatigue 10/15/2014  . Obstructive sleep apnea 10/15/2014  . Restless leg syndrome 10/15/2014  . Allergic rhinitis 09/30/2014  . Pain in joint, shoulder region 10/14/2013  . Memory loss 10/14/2013  . Constipation 08/30/2013  . Depression 08/30/2013  . S/P total knee  arthroplasty 08/23/2013  . Pain of right leg 08/23/2013  . Constipation due to pain medication 07/19/2013  . Hearing loss secondary to cerumen impaction 07/19/2013  . 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 Hypothyroidism 05/29/2013  . Esophageal reflux 05/29/2013  . Acquired absence of knee joint following removal of joint prosthesis with presence of antibiotic-impregnated cement spacer Left 04/19/2013  . Hx of radiation therapy   . Essential hypertension, benign 06/11/2012  . Multiple sclerosis ([facility]) 01/23/2012  . Breast cancer of upper-outer quadrant of right female  breast ([facility] [facility]) 11/22/2011  . Osteoarthritis of both knees 08/15/2011    Edwen Mclester,ANGIE PTA 05/21/2019, 11:46 AM  [place], [place], [address] Phone: [phone]   Fax:  [phone]  Name: Julie Ross MRN: [id number] Date of Birth: 05-Apr-1961

## 2019-05-22 ENCOUNTER — Encounter: Payer: Self-pay | Admitting: Physical Therapy

## 2019-05-22 ENCOUNTER — Ambulatory Visit: Payer: Commercial Managed Care - PPO | Admitting: Physical Therapy

## 2019-05-22 DIAGNOSIS — M545 Low back pain, unspecified: Secondary | ICD-10-CM

## 2019-05-22 DIAGNOSIS — R262 Difficulty in walking, not elsewhere classified: Secondary | ICD-10-CM

## 2019-05-22 DIAGNOSIS — M62830 Muscle spasm of back: Secondary | ICD-10-CM

## 2019-05-22 NOTE — Therapy (Signed)
[place], [place], [address] Phone: [phone]   Fax:  [phone]  Physical Therapy Treatment  Patient Details  Name: Julie Ross MRN: [id number] Date of Birth: Jul 31, 1960 Referring Provider (PT): Ostergard   Encounter Date: 05/22/2019  PT End of Session - 05/22/19 1136    Visit Number  3    Date for PT Re-Evaluation  07/06/19    PT Start Time  [id number]    PT Stop Time  1242    PT Time Calculation (min)  66 min    Activity Tolerance  Patient limited by pain    Behavior During Therapy  [facility] for tasks assessed/performed       Past Medical History:  Diagnosis Date  . Anemia    h/o - ? bld. transfusion - more recent- ?2013  . Anxiety    uses xanax mainly for sleep  . Arthritis    knees   . Breast cancer ([facility]) 11/2011   lul/ER+PR+, x1 lymph node   . Cancer (HCC)    squamous cell skin cancer x7  . Depression   . GERD (gastroesophageal reflux disease)   . H/O bone density study 03/2011  . H/O colonoscopy   . H/O echocardiogram    last one 02/2013, due to chemotherapy  . Heart burn   . Hx of radiation therapy 04/17/12- 06/04/12   left chest wall, axilla, L supraclavicular fossa 4500 cGy, left mastectomy scar/chest wall 5940 cGy  . Hypertension   . Hypothyroidism   . Multiple sclerosis ([facility] [place])   . Neuromuscular disorder ([place])    MS TX WITH CYMBALTA   . Personal history of chemotherapy   . Personal history of radiation therapy   . Sleep apnea    MILD SLEEP APNEA, NO (Needed) MACHINE  6 YRS AGO HPT REGIONAL   . Wears glasses     Past Surgical History:  Procedure Laterality Date  . APPLICATION OF ROBOTIC ASSISTANCE FOR SPINAL PROCEDURE N/A 03/12/2019   Procedure: APPLICATION OF ROBOTIC ASSISTANCE FOR SPINAL PROCEDURE;  Surgeon: Judith Part, MD;  Location: [facility];  Service: Neurosurgery;  Laterality: N/A;  . CESAREAN SECTION     X2   . COLONOSCOPY    . ESOPHAGOGASTRODUODENOSCOPY     . FLEXIBLE SIGMOIDOSCOPY    . I&D KNEE WITH POLY EXCHANGE  03/02/2012   Procedure: IRRIGATION AND DEBRIDEMENT KNEE WITH POLY EXCHANGE;  Surgeon: Alta Corning, MD;  Location: [facility];  Service: Orthopedics;  Laterality: Left;  I&D of total knee with Possible Poly Exchange   . JOINT REPLACEMENT  Feb 2013   left knee- multiple surgeries   . KNEE ARTHROSCOPY     LT KNEE 04/2011  . KNEE ARTHROSCOPY  84   rt  . MASTECTOMY    . MASTECTOMY W/ SENTINEL NODE BIOPSY  12/19/2011   Procedure: MASTECTOMY WITH SENTINEL LYMPH NODE BIOPSY;  Surgeon: Haywood Lasso, MD;  Location: [facility];  Service: General;  Laterality: Left;  left breast and left axilla   . MOHS SURGERY     right face  . NO PAST SURGERIES     BLADDER SLING  4-5 YRS AGO   . PORT-A-CATH REMOVAL  03/06/2012   Procedure: REMOVAL PORT-A-CATH;  Surgeon: Odis Hollingshead, MD;  Location: [facility];  Service: General;  Laterality: N/A;  . PORT-A-CATH REMOVAL Right 08/22/2012   Procedure: REMOVAL PORT-A-CATH;  Surgeon: Edward Jolly, MD;  Location: [facility];  Service: General;  Laterality: Right;  . PORTACATH PLACEMENT  01/16/2012   Procedure: INSERTION PORT-A-CATH;  Surgeon: Haywood Lasso, MD;  Location: [facility];  Service: General;  Laterality: Right;  [place] Cath Placement   . PORTACATH PLACEMENT  05/01/2012   Procedure: INSERTION PORT-A-CATH;  Surgeon: Haywood Lasso, MD;  Location: [facility];  Service: General;  Laterality: Right;  right internal jugular port-a-cath insertion  . TEE WITHOUT CARDIOVERSION  03/06/2012   Procedure: TRANSESOPHAGEAL ECHOCARDIOGRAM (TEE);  Surgeon: Josue Hector, MD;  Location: [facility];  Service: Cardiovascular;  Laterality: N/A;  Rm. 2927  . TONSILLECTOMY    . TOTAL KNEE ARTHROPLASTY  08/15/2011   Procedure: TOTAL KNEE ARTHROPLASTY; lft Surgeon: Alta Corning, MD;  Location: [facility];  Service: Orthopedics;  Laterality: Left;  RIGHT KNEE CORTIZONE INJECTION  . TOTAL KNEE  ARTHROPLASTY Left 08/18/2012   Procedure:  Irrigation and debridement of LEFT total knee;  Removal of total knee parts; Implant of spacers;  Surgeon: Kerin Salen, MD;  Location: [facility];  Service: Orthopedics;  Laterality: Left;  . TOTAL KNEE ARTHROPLASTY Right 08/12/2013  . TOTAL KNEE ARTHROPLASTY Right 08/12/2013   Procedure: RIGHT TOTAL KNEE ARTHROPLASTY;  Surgeon: Kerin Salen, MD;  Location: [facility];  Service: Orthopedics;  Laterality: Right;  . TOTAL KNEE REVISION Left 04/19/2013   Procedure: TOTAL KNEE REVISION AND REMOVAL CEMENT SPACER;  Surgeon: Kerin Salen, MD;  Location: [facility];  Service: Orthopedics;  Laterality: Left;  . TRANSFORAMINAL LUMBAR INTERBODY FUSION (TLIF) WITH PEDICLE SCREW FIXATION 1 LEVEL Bilateral 03/12/2019   Procedure: Lumbar four-five Open transforaminal lumbar interbody fusion with Lumbar four-five laminectomy, bilateral Lumbar four-five Pedicle screw placement;  Surgeon: Judith Part, MD;  Location: [facility];  Service: Neurosurgery;  Laterality: Bilateral;  . TUMOR REMOVAL     FROM PELVIS AGE 21    There were no vitals filed for this visit.  Subjective Assessment - 05/22/19 1137    Subjective  Pt report she feels awful today, had a lot of trouble going up stairs last night - had to crawl.  Was sitting and putting bags together for animal shelter for about 45 min, last night and this didn't help.    Patient Stated Goals  be stronger, stand longer, walk farther, have less pain    Currently in Pain?  Yes    Pain Score  6     Pain Location  Buttocks    Pain Orientation  Right    Pain Descriptors / Indicators  Aching;Jabbing;Shooting;Sharp    Pain Type  Acute pain;Surgical pain    Pain Onset  More than a month ago    Pain Frequency  Constant    Aggravating Factors   walking , standing    Pain Relieving Factors  lie down and be still                       [facility] Adult PT Treatment/Exercise - 05/22/19 0001      Exercises   Exercises   Lumbar;Knee/Hip      Lumbar Exercises: Stretches   Active Hamstring Stretch  Left;Right;30 seconds    Single Knee to Chest Stretch  Left;Right;20 seconds      Lumbar Exercises: Aerobic   Nustep  L 5 5 min      Lumbar Exercises: Supine   Clam  10 reps   bilat and alternating with TA engagement    Bridge  10  reps;Compliant    Bridge Limitations  3 sec holds      Lumbar Exercises: Quadruped   Other Quadruped Lumbar Exercises  unable to get on knees -performed leaning on high mat table - 10 reps kick backs to work eccentric hamstings, the 10 reps scorpion tails.       Modalities   Modalities  Electrical Stimulation;Moist Heat;Cryotherapy      Moist Heat Therapy   Number Minutes Moist Heat  15 Minutes    Moist Heat Location  Lumbar Spine      Cryotherapy   Number Minutes Cryotherapy  15 Minutes    Cryotherapy Location  Other (comment)   Rt ischial tuberosity   Type of Cryotherapy  Ice pack      Electrical Stimulation   Electrical Stimulation Location  lumbar and one on Rt ischial tuberosity    Electrical Stimulation Action  premod    Electrical Stimulation Parameters  supine to tolearnce    Electrical Stimulation Goals  Pain      Manual Therapy   Manual Therapy  Soft tissue mobilization;Passive ROM    Soft tissue mobilization  cross friction massage to Rt ischial tuberosity - point of tenderness    Passive ROM  R hip into extreme endrange of flexion to stretch out tender area.              PT Education - 05/22/19 1157    Education Details  home tens machine    Person(s) Educated  Patient    Methods  Explanation;Handout       PT Short Term Goals - 05/21/19 1145      PT SHORT TERM GOAL #1   Title  indepednent iwth initial HEP    Status  Achieved        PT Long Term Goals - 05/06/19 1611      PT LONG TERM GOAL #1   Title  understand posture and body mechanics    Time  8    Period  Weeks    Status  New      PT LONG TERM GOAL #2   Title  increase  lumbar ROM 25%    Time  8    Period  Weeks    Status  New      PT LONG TERM GOAL #3   Title  tolerate standing 15 minutes to cook a meal    Time  8    Period  Weeks    Status  New      PT LONG TERM GOAL #4   Title  walk 600 feet without rest    Time  8    Period  Weeks    Status  New      PT LONG TERM GOAL #5   Title  decrease pain 25%    Time  8    Period  Weeks    Status  New            Plan - 05/22/19 1227    Clinical Impression Statement  Julie Ross is frustrated by her slow progress since surgery.  Explained that she has a lot going on and it takes time.  She may be developing Rt ishcial tuberosity tendonitis.  It the pain and point tenderness continues I would recommend getting an order for iontophoresis to treat this.  Her lumbar paraspinals are very tight and would benefit from manual work to decrease this or a trial of foam rolling if tolerated.  She was issued  information on home TENs unit.    Rehab Potential  Good    PT Frequency  2x / week    PT Duration  8 weeks    PT Treatment/Interventions  ADLs/Self Care Home Management;Cryotherapy;Electrical Stimulation;Moist Heat;Stair training;Functional mobility training;Neuromuscular re-education;Gait training;Balance training;Therapeutic exercise;Therapeutic activities;Patient/family education;Manual techniques    PT Next Visit Plan  manual work to lumbar paraspinals and movement to promote muscle contraction/relaxation.  possibly get order for ionto if continues with Rt ischial tuberosity pain continues.    Consulted and Agree with Plan of Care  Patient       Patient will benefit from skilled therapeutic intervention in order to improve the following deficits and impairments:  Abnormal gait, Pain, Improper body mechanics, Postural dysfunction, Increased muscle spasms, Decreased mobility, Decreased activity tolerance, Decreased endurance, Decreased range of motion, Decreased strength, Difficulty walking, Impaired  flexibility  Visit Diagnosis: Acute bilateral low back pain without sciatica  Muscle spasm of back  Difficulty in walking, not elsewhere classified     Problem List Patient Active Problem List   Diagnosis Date Noted  . Lumbar radiculopathy 03/12/2019  . Spondylolisthesis at L4-L5 level 08/24/2018  . Facet syndrome, lumbar 08/24/2018  . Chronic SI joint pain 03/30/2018  . Delayed sleep phase syndrome 03/30/2018  . History of optic neuritis 07/19/2016  . Hyperlipidemia 06/18/2015  . H/O malignant neoplasm of breast 02/18/2015  . Gonalgia 02/18/2015  . Relapsing remitting multiple sclerosis ([place]) 02/18/2015  . Well adult exam 02/02/2015  . Abdominal pain, generalized 02/02/2015  . Physical exam 12/17/2014  . High risk medication use 10/15/2014  . Anxiety state 10/15/2014  . Other fatigue 10/15/2014  . Obstructive sleep apnea 10/15/2014  . Restless leg syndrome 10/15/2014  . Allergic rhinitis 09/30/2014  . Pain in joint, shoulder region 10/14/2013  . Memory loss 10/14/2013  . Constipation 08/30/2013  . Depression 08/30/2013  . S/P total knee arthroplasty 08/23/2013  . Pain of right leg 08/23/2013  . Constipation due to pain medication 07/19/2013  . Hearing loss secondary to cerumen impaction 07/19/2013  . 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 Hypothyroidism 05/29/2013  . Esophageal reflux 05/29/2013  . Acquired absence of knee joint following removal of joint prosthesis with presence of antibiotic-impregnated cement spacer Left 04/19/2013  . Hx of radiation therapy   . Essential hypertension, benign 06/11/2012  . Multiple sclerosis ([place]) 01/23/2012  . Breast cancer of upper-outer quadrant of right female breast ([place]) 11/22/2011  . Osteoarthritis of both knees 08/15/2011    Jeral Pinch PT 05/22/2019, 12:42 PM  [place], [place], [address] Phone: [phone]    Fax:  [phone]  Name: Julie Ross MRN: [id number] Date of Birth: [birth year]

## 2019-05-22 NOTE — Patient Instructions (Signed)
Access Code: [facility]  URL: [email or web address]  Date: 05/22/2019  Prepared by: Jeral Pinch   Patient Education  TENS Therapy

## 2019-05-24 ENCOUNTER — Other Ambulatory Visit: Payer: Self-pay | Admitting: Neurology

## 2019-05-27 NOTE — Telephone Encounter (Signed)
Checked drug registry. She last refilled rx xanax 03/31/19 #90. Last seen 02/20/19 and next f/u 08/22/19  She recently received refill of diazepam 5mg  tab 04/26/19 #30 from Emelda Brothers, MD [facility]).

## 2019-05-28 ENCOUNTER — Ambulatory Visit: Payer: Commercial Managed Care - PPO | Admitting: Physical Therapy

## 2019-05-28 ENCOUNTER — Encounter: Payer: Commercial Managed Care - PPO | Admitting: Physical Therapy

## 2019-05-31 ENCOUNTER — Ambulatory Visit: Payer: Commercial Managed Care - PPO | Admitting: Physical Therapy

## 2019-05-31 ENCOUNTER — Other Ambulatory Visit: Payer: Self-pay

## 2019-05-31 DIAGNOSIS — M62830 Muscle spasm of back: Secondary | ICD-10-CM

## 2019-05-31 DIAGNOSIS — M545 Low back pain, unspecified: Secondary | ICD-10-CM

## 2019-05-31 DIAGNOSIS — R262 Difficulty in walking, not elsewhere classified: Secondary | ICD-10-CM

## 2019-05-31 NOTE — Therapy (Signed)
[place], [place], [address] Phone: [phone]   Fax:  [phone]  Physical Therapy Treatment  Patient Details  Name: Julie Ross MRN: [id number] Date of Birth: 06-02-1961 Referring Provider (PT): Ostergard   Encounter Date: 05/31/2019  PT End of Session - 05/31/19 1145    Visit Number  4    Date for PT Re-Evaluation  07/06/19    PT Start Time  1100    PT Stop Time  6754    PT Time Calculation (min)  56 min       Past Medical History:  Diagnosis Date  . Anemia    h/o - ? bld. transfusion - more recent- ?2013  . Anxiety    uses xanax mainly for sleep  . Arthritis    knees   . Breast cancer ([place]) 11/2011   lul/ER+PR+, x1 lymph node   . Cancer (HCC)    squamous cell skin cancer x7  . Depression   . GERD (gastroesophageal reflux disease)   . H/O bone density study 03/2011  . H/O colonoscopy   . H/O echocardiogram    last one 02/2013, due to chemotherapy  . Heart burn   . Hx of radiation therapy 04/17/12- 06/04/12   left chest wall, axilla, L supraclavicular fossa 4500 cGy, left mastectomy scar/chest wall 5940 cGy  . Hypertension   . Hypothyroidism   . Multiple sclerosis ([place])   . Neuromuscular disorder ([place])    MS TX WITH CYMBALTA   . Personal history of chemotherapy   . Personal history of radiation therapy   . Sleep apnea    MILD SLEEP APNEA, NO (Needed) MACHINE  6 YRS AGO HPT REGIONAL   . Wears glasses     Past Surgical History:  Procedure Laterality Date  . APPLICATION OF ROBOTIC ASSISTANCE FOR SPINAL PROCEDURE N/A 03/12/2019   Procedure: APPLICATION OF ROBOTIC ASSISTANCE FOR SPINAL PROCEDURE;  Surgeon: Judith Part, MD;  Location: [facility];  Service: Neurosurgery;  Laterality: N/A;  . CESAREAN SECTION     X2   . COLONOSCOPY    . ESOPHAGOGASTRODUODENOSCOPY    . FLEXIBLE SIGMOIDOSCOPY    . I&D KNEE WITH POLY EXCHANGE  03/02/2012   Procedure: IRRIGATION AND  DEBRIDEMENT KNEE WITH POLY EXCHANGE;  Surgeon: Alta Corning, MD;  Location: [facility];  Service: Orthopedics;  Laterality: Left;  I&D of total knee with Possible Poly Exchange   . JOINT REPLACEMENT  Feb 2013   left knee- multiple surgeries   . KNEE ARTHROSCOPY     LT KNEE 04/2011  . KNEE ARTHROSCOPY  84   rt  . MASTECTOMY    . MASTECTOMY W/ SENTINEL NODE BIOPSY  12/19/2011   Procedure: MASTECTOMY WITH SENTINEL LYMPH NODE BIOPSY;  Surgeon: Haywood Lasso, MD;  Location: [facility];  Service: General;  Laterality: Left;  left breast and left axilla   . MOHS SURGERY     right face  . NO PAST SURGERIES     BLADDER SLING  4-5 YRS AGO   . PORT-A-CATH REMOVAL  03/06/2012   Procedure: REMOVAL PORT-A-CATH;  Surgeon: Odis Hollingshead, MD;  Location: [facility];  Service: General;  Laterality: N/A;  . PORT-A-CATH REMOVAL Right 08/22/2012   Procedure: REMOVAL PORT-A-CATH;  Surgeon: Edward Jolly, MD;  Location: [facility];  Service: General;  Laterality: Right;  . PORTACATH PLACEMENT  01/16/2012   Procedure: INSERTION PORT-A-CATH;  Surgeon: Haywood Lasso,  MD;  Location: [facility];  Service: General;  Laterality: Right;  [place] Cath Placement   . PORTACATH PLACEMENT  05/01/2012   Procedure: INSERTION PORT-A-CATH;  Surgeon: Haywood Lasso, MD;  Location: [facility];  Service: General;  Laterality: Right;  right internal jugular port-a-cath insertion  . TEE WITHOUT CARDIOVERSION  03/06/2012   Procedure: TRANSESOPHAGEAL ECHOCARDIOGRAM (TEE);  Surgeon: Josue Hector, MD;  Location: [facility];  Service: Cardiovascular;  Laterality: N/A;  Rm. 2927  . TONSILLECTOMY    . TOTAL KNEE ARTHROPLASTY  08/15/2011   Procedure: TOTAL KNEE ARTHROPLASTY; lft Surgeon: Alta Corning, MD;  Location: [facility];  Service: Orthopedics;  Laterality: Left;  RIGHT KNEE CORTIZONE INJECTION  . TOTAL KNEE ARTHROPLASTY Left 08/18/2012   Procedure:  Irrigation and debridement of LEFT total knee;  Removal of  total knee parts; Implant of spacers;  Surgeon: Kerin Salen, MD;  Location: [facility];  Service: Orthopedics;  Laterality: Left;  . TOTAL KNEE ARTHROPLASTY Right 08/12/2013  . TOTAL KNEE ARTHROPLASTY Right 08/12/2013   Procedure: RIGHT TOTAL KNEE ARTHROPLASTY;  Surgeon: Kerin Salen, MD;  Location: [facility];  Service: Orthopedics;  Laterality: Right;  . TOTAL KNEE REVISION Left 04/19/2013   Procedure: TOTAL KNEE REVISION AND REMOVAL CEMENT SPACER;  Surgeon: Kerin Salen, MD;  Location: [facility];  Service: Orthopedics;  Laterality: Left;  . TRANSFORAMINAL LUMBAR INTERBODY FUSION (TLIF) WITH PEDICLE SCREW FIXATION 1 LEVEL Bilateral 03/12/2019   Procedure: Lumbar four-five Open transforaminal lumbar interbody fusion with Lumbar four-five laminectomy, bilateral Lumbar four-five Pedicle screw placement;  Surgeon: Judith Part, MD;  Location: [facility];  Service: Neurosurgery;  Laterality: Bilateral;  . TUMOR REMOVAL     FROM PELVIS AGE 76    There were no vitals filed for this visit.  Subjective Assessment - 05/31/19 1104    Subjective  "better", pain is in RT buttock and knee doesn't want to work    Currently in Pain?  Yes    Pain Score  5     Pain Location  Buttocks    Pain Orientation  Right                       [facility] Adult PT Treatment/Exercise - 05/31/19 0001      Lumbar Exercises: Aerobic   Nustep  L 3 5 min      Lumbar Exercises: Standing   Row  Strengthening;15 reps;Theraband    Shoulder Extension  Strengthening;Both;15 reps;Theraband    Other Standing Lumbar Exercises  hip ext and abd 12 each leg with UE support    Other Standing Lumbar Exercises  trunk ext red tband 10 x      Lumbar Exercises: Supine   Clam  15 reps   red tband   Bent Knee Raise  15 reps   red tband   Bridge  Compliant;10 reps;2 seconds    Bridge Limitations  KTC and obl with ball 10 each    Bridge with [employer]  Compliant;10 reps;2 seconds      Modalities   Modalities  Moist  Heat;Iontophoresis      Moist Heat Therapy   Number Minutes Moist Heat  15 Minutes    Moist Heat Location  Lumbar Spine   buttock     Iontophoresis   Type of Iontophoresis  Dexamethasone    Location  left ischial tub    Dose  4 hour 80 mA patch  Time  1.2 cc      Manual Therapy   Manual Therapy  Soft tissue mobilization;Passive ROM    Soft tissue mobilization  cross friction massage to Rt ischial tuberosity - point of tenderness    Passive ROM  BIL LE and trunk   left knee flex/ext              PT Short Term Goals - 05/21/19 1145      PT SHORT TERM GOAL #1   Title  indepednent iwth initial HEP    Status  Achieved        PT Long Term Goals - 05/31/19 1145      PT LONG TERM GOAL #1   Title  understand posture and body mechanics    Status  On-going      PT LONG TERM GOAL #2   Title  increase lumbar ROM 25%    Status  Partially Met      PT LONG TERM GOAL #3   Title  tolerate standing 15 minutes to cook a meal    Status  On-going      PT LONG TERM GOAL #4   Title  walk 600 feet without rest    Status  Partially Met      PT LONG TERM GOAL #5   Title  decrease pain 25%    Status  On-going            Plan - 05/31/19 1146    Clinical Impression Statement  pt has increased eas eof mvmt and tolerance to ex as compared to last time this PTA tx her. pt is progressing with goals. Very tight BIL LE, knot and tenderness RT isichial tub and restricted left knee ROM. Trial of [place] to Rt isichial tub and gave info for TENS    PT Treatment/Interventions  ADLs/Self Care Home Management;Cryotherapy;Electrical Stimulation;Moist Heat;Stair training;Functional mobility training;Neuromuscular re-education;Gait training;Balance training;Therapeutic exercise;Therapeutic activities;Patient/family education;Manual techniques    PT Next Visit Plan  manual work to lumbar paraspinals and movement to promote muscle contraction/relaxation. asses ionto       Patient will  benefit from skilled therapeutic intervention in order to improve the following deficits and impairments:  Abnormal gait, Pain, Improper body mechanics, Postural dysfunction, Increased muscle spasms, Decreased mobility, Decreased activity tolerance, Decreased endurance, Decreased range of motion, Decreased strength, Difficulty walking, Impaired flexibility  Visit Diagnosis: Acute bilateral low back pain without sciatica  Muscle spasm of back  Difficulty in walking, not elsewhere classified     Problem List Patient Active Problem List   Diagnosis Date Noted  . Lumbar radiculopathy 03/12/2019  . Spondylolisthesis at L4-L5 level 08/24/2018  . Facet syndrome, lumbar 08/24/2018  . Chronic SI joint pain 03/30/2018  . Delayed sleep phase syndrome 03/30/2018  . History of optic neuritis 07/19/2016  . Hyperlipidemia 06/18/2015  . H/O malignant neoplasm of breast 02/18/2015  . Gonalgia 02/18/2015  . Relapsing remitting multiple sclerosis ([place]) 02/18/2015  . Well adult exam 02/02/2015  . Abdominal pain, generalized 02/02/2015  . Physical exam 12/17/2014  . High risk medication use 10/15/2014  . Anxiety state 10/15/2014  . Other fatigue 10/15/2014  . Obstructive sleep apnea 10/15/2014  . Restless leg syndrome 10/15/2014  . Allergic rhinitis 09/30/2014  . Pain in joint, shoulder region 10/14/2013  . Memory loss 10/14/2013  . Constipation 08/30/2013  . Depression 08/30/2013  . S/P total knee arthroplasty 08/23/2013  . Pain of right leg 08/23/2013  . Constipation due to pain medication 07/19/2013  .  Hearing loss secondary to cerumen impaction 07/19/2013  . 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 Hypothyroidism 05/29/2013  . Esophageal reflux 05/29/2013  . Acquired absence of knee joint following removal of joint prosthesis with presence of antibiotic-impregnated cement spacer Left 04/19/2013  . Hx of radiation therapy   . Essential hypertension, benign 06/11/2012  .  Multiple sclerosis ([place]) 01/23/2012  . Breast cancer of upper-outer quadrant of right female breast ([place]) 11/22/2011  . Osteoarthritis of both knees 08/15/2011  By signing I understand that I am ordering/authorizing the use of Iontophoresis using 4 mg/mL of dexamethasone as a component of this plan of care.  Devrin Monforte,ANGIE PTA 05/31/2019, 11:49 AM [address] [place], [place], [place], [address] Phone: [phone]   Fax:  [phone]  Name: METTE SOUTHGATE MRN: [id number] Date of Birth: 01-08-1961

## 2019-06-04 ENCOUNTER — Other Ambulatory Visit: Payer: Self-pay

## 2019-06-04 ENCOUNTER — Ambulatory Visit: Payer: Commercial Managed Care - PPO | Admitting: Physical Therapy

## 2019-06-04 DIAGNOSIS — M545 Low back pain, unspecified: Secondary | ICD-10-CM

## 2019-06-04 DIAGNOSIS — M62830 Muscle spasm of back: Secondary | ICD-10-CM

## 2019-06-04 DIAGNOSIS — R262 Difficulty in walking, not elsewhere classified: Secondary | ICD-10-CM

## 2019-06-04 NOTE — Therapy (Signed)
[place], [place], [address] Phone: [phone]   Fax:  [phone]  Physical Therapy Treatment  Patient Details  Name: NAVIYAH Ross MRN: [id number] Date of Birth: 1961/03/05 Referring Provider (PT): Ostergard   Encounter Date: 06/04/2019  PT End of Session - 06/04/19 1249    Visit Number  5    Date for PT Re-Evaluation  07/06/19    PT Start Time  1108    PT Stop Time  1205    PT Time Calculation (min)  57 min       Past Medical History:  Diagnosis Date  . Anemia    h/o - ? bld. transfusion - more recent- ?2013  . Anxiety    uses xanax mainly for sleep  . Arthritis    knees   . Breast cancer ([place]) 11/2011   lul/ER+PR+, x1 lymph node   . Cancer (HCC)    squamous cell skin cancer x7  . Depression   . GERD (gastroesophageal reflux disease)   . H/O bone density study 03/2011  . H/O colonoscopy   . H/O echocardiogram    last one 02/2013, due to chemotherapy  . Heart burn   . Hx of radiation therapy 04/17/12- 06/04/12   left chest wall, axilla, L supraclavicular fossa 4500 cGy, left mastectomy scar/chest wall 5940 cGy  . Hypertension   . Hypothyroidism   . Multiple sclerosis ([place])   . Neuromuscular disorder ([place])    MS TX WITH CYMBALTA   . Personal history of chemotherapy   . Personal history of radiation therapy   . Sleep apnea    MILD SLEEP APNEA, NO (Needed) MACHINE  6 YRS AGO HPT REGIONAL   . Wears glasses     Past Surgical History:  Procedure Laterality Date  . APPLICATION OF ROBOTIC ASSISTANCE FOR SPINAL PROCEDURE N/A 03/12/2019   Procedure: APPLICATION OF ROBOTIC ASSISTANCE FOR SPINAL PROCEDURE;  Surgeon: Judith Part, MD;  Location: [facility];  Service: Neurosurgery;  Laterality: N/A;  . CESAREAN SECTION     X2   . COLONOSCOPY    . ESOPHAGOGASTRODUODENOSCOPY    . FLEXIBLE SIGMOIDOSCOPY    . I&D KNEE WITH POLY EXCHANGE  03/02/2012   Procedure: IRRIGATION AND  DEBRIDEMENT KNEE WITH POLY EXCHANGE;  Surgeon: Alta Corning, MD;  Location: [facility];  Service: Orthopedics;  Laterality: Left;  I&D of total knee with Possible Poly Exchange   . JOINT REPLACEMENT  Feb 2013   left knee- multiple surgeries   . KNEE ARTHROSCOPY     LT KNEE 04/2011  . KNEE ARTHROSCOPY  84   rt  . MASTECTOMY    . MASTECTOMY W/ SENTINEL NODE BIOPSY  12/19/2011   Procedure: MASTECTOMY WITH SENTINEL LYMPH NODE BIOPSY;  Surgeon: Haywood Lasso, MD;  Location: [facility];  Service: General;  Laterality: Left;  left breast and left axilla   . MOHS SURGERY     right face  . NO PAST SURGERIES     BLADDER SLING  4-5 YRS AGO   . PORT-A-CATH REMOVAL  03/06/2012   Procedure: REMOVAL PORT-A-CATH;  Surgeon: Odis Hollingshead, MD;  Location: [facility];  Service: General;  Laterality: N/A;  . PORT-A-CATH REMOVAL Right 08/22/2012   Procedure: REMOVAL PORT-A-CATH;  Surgeon: Edward Jolly, MD;  Location: [facility];  Service: General;  Laterality: Right;  . PORTACATH PLACEMENT  01/16/2012   Procedure: INSERTION PORT-A-CATH;  Surgeon: Haywood Lasso,  MD;  Location: [facility];  Service: General;  Laterality: Right;  [place] Cath Placement   . PORTACATH PLACEMENT  05/01/2012   Procedure: INSERTION PORT-A-CATH;  Surgeon: Haywood Lasso, MD;  Location: [facility];  Service: General;  Laterality: Right;  right internal jugular port-a-cath insertion  . TEE WITHOUT CARDIOVERSION  03/06/2012   Procedure: TRANSESOPHAGEAL ECHOCARDIOGRAM (TEE);  Surgeon: Josue Hector, MD;  Location: [facility];  Service: Cardiovascular;  Laterality: N/A;  Rm. 2927  . TONSILLECTOMY    . TOTAL KNEE ARTHROPLASTY  08/15/2011   Procedure: TOTAL KNEE ARTHROPLASTY; lft Surgeon: Alta Corning, MD;  Location: [facility];  Service: Orthopedics;  Laterality: Left;  RIGHT KNEE CORTIZONE INJECTION  . TOTAL KNEE ARTHROPLASTY Left 08/18/2012   Procedure:  Irrigation and debridement of LEFT total knee;  Removal of  total knee parts; Implant of spacers;  Surgeon: Kerin Salen, MD;  Location: [facility];  Service: Orthopedics;  Laterality: Left;  . TOTAL KNEE ARTHROPLASTY Right 08/12/2013  . TOTAL KNEE ARTHROPLASTY Right 08/12/2013   Procedure: RIGHT TOTAL KNEE ARTHROPLASTY;  Surgeon: Kerin Salen, MD;  Location: [facility];  Service: Orthopedics;  Laterality: Right;  . TOTAL KNEE REVISION Left 04/19/2013   Procedure: TOTAL KNEE REVISION AND REMOVAL CEMENT SPACER;  Surgeon: Kerin Salen, MD;  Location: [facility];  Service: Orthopedics;  Laterality: Left;  . TRANSFORAMINAL LUMBAR INTERBODY FUSION (TLIF) WITH PEDICLE SCREW FIXATION 1 LEVEL Bilateral 03/12/2019   Procedure: Lumbar four-five Open transforaminal lumbar interbody fusion with Lumbar four-five laminectomy, bilateral Lumbar four-five Pedicle screw placement;  Surgeon: Judith Part, MD;  Location: [facility];  Service: Neurosurgery;  Laterality: Bilateral;  . TUMOR REMOVAL     FROM PELVIS AGE 65    There were no vitals filed for this visit.  Subjective Assessment - 06/04/19 1233    Subjective  about the same , so frustrated." I did feel tingling from ionto"    Currently in Pain?  Yes    Pain Score  6     Pain Location  Buttocks                       [facility] Adult PT Treatment/Exercise - 06/04/19 0001      Lumbar Exercises: Machines for Strengthening   Cybex Knee Extension  SL 5# 10 each, BIL 10# 10 x    Cybex Knee Flexion  SL 15# 10 each, 20 # BIL 10 X    Leg Press  30# BIL 10 x, 20 # SL 2 sets 5 each      Modalities   Modalities  Iontophoresis;Moist Heat;Electrical Stimulation;Ultrasound      Moist Heat Therapy   Number Minutes Moist Heat  15 Minutes    Moist Heat Location  Other (comment)   RT buttock     Electrical Stimulation   Electrical Stimulation Location   Rt ischial tuberosity    Electrical Stimulation Action  IFC    Electrical Stimulation Parameters  supine to tolerance    Electrical Stimulation Goals  Pain       Ultrasound   Ultrasound Location  Rt buttocka nd ischial tub with deep pressure of UE head    Ultrasound Parameters  1.2 w/cm 100 % continuous    Ultrasound Goals  Pain      Iontophoresis   Type of Iontophoresis  Dexamethasone    Location  RT ischial tub    Dose  4 hour 80 mA  patch    Time  1.2 cc      Manual Therapy   Manual Therapy  Soft tissue mobilization;Passive ROM    Soft tissue mobilization  cross friction massage to Rt ischial tuberosity - point of tenderness   with use of vibration              PT Short Term Goals - 05/21/19 1145      PT SHORT TERM GOAL #1   Title  indepednent iwth initial HEP    Status  Achieved        PT Long Term Goals - 05/31/19 1145      PT LONG TERM GOAL #1   Title  understand posture and body mechanics    Status  On-going      PT LONG TERM GOAL #2   Title  increase lumbar ROM 25%    Status  Partially Met      PT LONG TERM GOAL #3   Title  tolerate standing 15 minutes to cook a meal    Status  On-going      PT LONG TERM GOAL #4   Title  walk 600 feet without rest    Status  Partially Met      PT LONG TERM GOAL #5   Title  decrease pain 25%    Status  On-going            Plan - 06/04/19 1250    Clinical Impression Statement  weakness and pain with LE ex on Left vs RT with compensations- cued to correct. [place] with deep pressure and CFM with vibration to ischial tuberosity. pt compensated for weakness in Left knee whuch maybe causing RT pain, pt also has weak core.    PT Treatment/Interventions  ADLs/Self Care Home Management;Cryotherapy;Electrical Stimulation;Moist Heat;Stair training;Functional mobility training;Neuromuscular re-education;Gait training;Balance training;Therapeutic exercise;Therapeutic activities;Patient/family education;Manual techniques    PT Next Visit Plan  assess how [place] and ionto did. core strengthening       Patient will benefit from skilled therapeutic intervention in order to improve the  following deficits and impairments:  Abnormal gait, Pain, Improper body mechanics, Postural dysfunction, Increased muscle spasms, Decreased mobility, Decreased activity tolerance, Decreased endurance, Decreased range of motion, Decreased strength, Difficulty walking, Impaired flexibility  Visit Diagnosis: Muscle spasm of back  Acute bilateral low back pain without sciatica  Difficulty in walking, not elsewhere classified     Problem List Patient Active Problem List   Diagnosis Date Noted  . Lumbar radiculopathy 03/12/2019  . Spondylolisthesis at L4-L5 level 08/24/2018  . Facet syndrome, lumbar 08/24/2018  . Chronic SI joint pain 03/30/2018  . Delayed sleep phase syndrome 03/30/2018  . History of optic neuritis 07/19/2016  . Hyperlipidemia 06/18/2015  . H/O malignant neoplasm of breast 02/18/2015  . Gonalgia 02/18/2015  . Relapsing remitting multiple sclerosis ([place]) 02/18/2015  . Well adult exam 02/02/2015  . Abdominal pain, generalized 02/02/2015  . Physical exam 12/17/2014  . High risk medication use 10/15/2014  . Anxiety state 10/15/2014  . Other fatigue 10/15/2014  . Obstructive sleep apnea 10/15/2014  . Restless leg syndrome 10/15/2014  . Allergic rhinitis 09/30/2014  . Pain in joint, shoulder region 10/14/2013  . Memory loss 10/14/2013  . Constipation 08/30/2013  . Depression 08/30/2013  . S/P total knee arthroplasty 08/23/2013  . Pain of right leg 08/23/2013  . Constipation due to pain medication 07/19/2013  . Hearing loss secondary to cerumen impaction 07/19/2013  . 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  Hypothyroidism 05/29/2013  . Esophageal reflux 05/29/2013  . Acquired absence of knee joint following removal of joint prosthesis with presence of antibiotic-impregnated cement spacer Left 04/19/2013  . Hx of radiation therapy   . Essential hypertension, benign 06/11/2012  . Multiple sclerosis ([place]) 01/23/2012  . Breast cancer of upper-outer  quadrant of right female breast ([place]) 11/22/2011  . Osteoarthritis of both knees 08/15/2011    Merida Alcantar,ANGIE PTA 06/04/2019, 12:53 PM  [place], [place], [address] Phone: [phone]   Fax:  [phone]  Name: DORIANNA MCKIVER MRN: [id number] Date of Birth: 1961-02-09

## 2019-06-06 ENCOUNTER — Ambulatory Visit: Payer: Commercial Managed Care - PPO | Admitting: Physical Therapy

## 2019-06-06 ENCOUNTER — Other Ambulatory Visit: Payer: Self-pay

## 2019-06-06 DIAGNOSIS — M62830 Muscle spasm of back: Secondary | ICD-10-CM

## 2019-06-06 DIAGNOSIS — M545 Low back pain, unspecified: Secondary | ICD-10-CM

## 2019-06-06 DIAGNOSIS — R262 Difficulty in walking, not elsewhere classified: Secondary | ICD-10-CM

## 2019-06-06 NOTE — Therapy (Signed)
[place], [place], [address] Phone: [phone]   Fax:  [phone]  Physical Therapy Treatment  Patient Details  Name: Julie Ross MRN: [id number] Date of Birth: April 28, 1961 Referring Provider (PT): [place]   Encounter Date: 06/06/2019  PT End of Session - 06/06/19 1105    Visit Number  6    Date for PT Re-Evaluation  07/06/19    PT Start Time  1105    PT Stop Time  1207    PT Time Calculation (min)  62 min    Activity Tolerance  Patient tolerated treatment well    Behavior During Therapy  [facility] for tasks assessed/performed       Past Medical History:  Diagnosis Date  . Anemia    h/o - ? bld. transfusion - more recent- ?2013  . Anxiety    uses xanax mainly for sleep  . Arthritis    knees   . Breast cancer ([place]) 11/2011   lul/ER+PR+, x1 lymph node   . Cancer (HCC)    squamous cell skin cancer x7  . Depression   . GERD (gastroesophageal reflux disease)   . H/O bone density study 03/2011  . H/O colonoscopy   . H/O echocardiogram    last one 02/2013, due to chemotherapy  . Heart burn   . Hx of radiation therapy 04/17/12- 06/04/12   left chest wall, axilla, L supraclavicular fossa 4500 cGy, left mastectomy scar/chest wall 5940 cGy  . Hypertension   . Hypothyroidism   . Multiple sclerosis ([place])   . Neuromuscular disorder ([place])    MS TX WITH CYMBALTA   . Personal history of chemotherapy   . Personal history of radiation therapy   . Sleep apnea    MILD SLEEP APNEA, NO (Needed) MACHINE  6 YRS AGO HPT REGIONAL   . Wears glasses     Past Surgical History:  Procedure Laterality Date  . APPLICATION OF ROBOTIC ASSISTANCE FOR SPINAL PROCEDURE N/A 03/12/2019   Procedure: APPLICATION OF ROBOTIC ASSISTANCE FOR SPINAL PROCEDURE;  Surgeon: Judith Part, MD;  Location: [place];  Service: Neurosurgery;  Laterality: N/A;  . CESAREAN SECTION     X2   . COLONOSCOPY    .  ESOPHAGOGASTRODUODENOSCOPY    . FLEXIBLE SIGMOIDOSCOPY    . I & D KNEE WITH POLY EXCHANGE  03/02/2012   Procedure: IRRIGATION AND DEBRIDEMENT KNEE WITH POLY EXCHANGE;  Surgeon: Alta Corning, MD;  Location: [place];  Service: Orthopedics;  Laterality: Left;  I&D of total knee with Possible Poly Exchange   . JOINT REPLACEMENT  Feb 2013   left knee- multiple surgeries   . KNEE ARTHROSCOPY     LT KNEE 04/2011  . KNEE ARTHROSCOPY  84   rt  . MASTECTOMY    . MASTECTOMY W/ SENTINEL NODE BIOPSY  12/19/2011   Procedure: MASTECTOMY WITH SENTINEL LYMPH NODE BIOPSY;  Surgeon: Haywood Lasso, MD;  Location: [place];  Service: General;  Laterality: Left;  left breast and left axilla   . MOHS SURGERY     right face  . NO PAST SURGERIES     BLADDER SLING  4-5 YRS AGO   . PORT-A-CATH REMOVAL  03/06/2012   Procedure: REMOVAL PORT-A-CATH;  Surgeon: Odis Hollingshead, MD;  Location: [place];  Service: General;  Laterality: N/A;  . PORT-A-CATH REMOVAL Right 08/22/2012   Procedure: REMOVAL PORT-A-CATH;  Surgeon: Edward Jolly, MD;  Location: [facility]  OR;  Service: General;  Laterality: Right;  . PORTACATH PLACEMENT  01/16/2012   Procedure: INSERTION PORT-A-CATH;  Surgeon: Haywood Lasso, MD;  Location: [place];  Service: General;  Laterality: Right;  [place] Cath Placement   . PORTACATH PLACEMENT  05/01/2012   Procedure: INSERTION PORT-A-CATH;  Surgeon: Haywood Lasso, MD;  Location: [place];  Service: General;  Laterality: Right;  right internal jugular port-a-cath insertion  . TEE WITHOUT CARDIOVERSION  03/06/2012   Procedure: TRANSESOPHAGEAL ECHOCARDIOGRAM (TEE);  Surgeon: Josue Hector, MD;  Location: [place];  Service: Cardiovascular;  Laterality: N/A;  Rm. 2927  . TONSILLECTOMY    . TOTAL KNEE ARTHROPLASTY  08/15/2011   Procedure: TOTAL KNEE ARTHROPLASTY; lft Surgeon: Alta Corning, MD;  Location: [place];  Service: Orthopedics;  Laterality: Left;  RIGHT KNEE  CORTIZONE INJECTION  . TOTAL KNEE ARTHROPLASTY Left 08/18/2012   Procedure:  Irrigation and debridement of LEFT total knee;  Removal of total knee parts; Implant of spacers;  Surgeon: Kerin Salen, MD;  Location: [place];  Service: Orthopedics;  Laterality: Left;  . TOTAL KNEE ARTHROPLASTY Right 08/12/2013  . TOTAL KNEE ARTHROPLASTY Right 08/12/2013   Procedure: RIGHT TOTAL KNEE ARTHROPLASTY;  Surgeon: Kerin Salen, MD;  Location: [place];  Service: Orthopedics;  Laterality: Right;  . TOTAL KNEE REVISION Left 04/19/2013   Procedure: TOTAL KNEE REVISION AND REMOVAL CEMENT SPACER;  Surgeon: Kerin Salen, MD;  Location: [place];  Service: Orthopedics;  Laterality: Left;  . TRANSFORAMINAL LUMBAR INTERBODY FUSION (TLIF) WITH PEDICLE SCREW FIXATION 1 LEVEL Bilateral 03/12/2019   Procedure: Lumbar four-five Open transforaminal lumbar interbody fusion with Lumbar four-five laminectomy, bilateral Lumbar four-five Pedicle screw placement;  Surgeon: Judith Part, MD;  Location: [place];  Service: Neurosurgery;  Laterality: Bilateral;  . TUMOR REMOVAL     FROM PELVIS AGE 63    There were no vitals filed for this visit.  Subjective Assessment - 06/06/19 1105    Subjective  Pt got her homes tens    Patient Stated Goals  be stronger, stand longer, walk farther, have less pain    Currently in Pain?  Yes    Pain Score  5          [facility] PT Assessment - 06/06/19 0001      Assessment   Medical Diagnosis  s/p lumbar fusion L4-5    Referring Provider (PT)  Zada Finders                   [facility] Adult PT Treatment/Exercise - 06/06/19 0001      Self-Care   Self-Care  Other Self-Care Comments    Other Self-Care Comments   instruction in home TENs unit use and safety      Lumbar Exercises: Stretches   Single Knee to Chest Stretch  Left;Right;30 seconds      Lumbar Exercises: Aerobic   Stationary Bike  attempted - pt not able to do well secondary to limited knee flex    Nustep  L6x5'       Lumbar Exercises: Standing   Other Standing Lumbar Exercises  7 reps dead lifts VC for form with 10# - pt felt more in her back than HS origin      Lumbar Exercises: Supine   Bridge  10 reps    Bridge Limitations  then 10 reps figure 4 bridge      Modalities   Modalities  Iontophoresis;Ultrasound;Electrical Stimulation;Moist Heat  Moist Heat Therapy   Number Minutes Moist Heat  15 Minutes    Moist Heat Location  --   lumbar and buttocks     Electrical Stimulation   Electrical Stimulation Location   Rt ischial tuberosity and low back, Lt knee    Electrical Stimulation Action  using pts own machine    Electrical Stimulation Parameters  supine     Electrical Stimulation Goals  Pain      Ultrasound   Ultrasound Location  Rt ischiacl tuberosity with deep pressure     Ultrasound Parameters  3' static at 20%, 0.80w/cm, then 5 min at 50%, 1.0w/cm2    Ultrasound Goals  Pain      Iontophoresis   Type of Iontophoresis  Dexamethasone    Location  RT ischial tub    Dose  4 hour 80 mA patch    Time  1.2 cc             PT Education - 06/06/19 1148    Education Details  home TENs until    [employer]) Educated  Patient    Methods  Explanation;Handout;Demonstration    Comprehension  Verbalized understanding;Returned demonstration       PT Short Term Goals - 05/21/19 1145      PT SHORT TERM GOAL #1   Title  indepednent iwth initial HEP    Status  Achieved        PT Long Term Goals - 05/31/19 1145      PT LONG TERM GOAL #1   Title  understand posture and body mechanics    Status  On-going      PT LONG TERM GOAL #2   Title  increase lumbar ROM 25%    Status  Partially Met      PT LONG TERM GOAL #3   Title  tolerate standing 15 minutes to cook a meal    Status  On-going      PT LONG TERM GOAL #4   Title  walk 600 feet without rest    Status  Partially Met      PT LONG TERM GOAL #5   Title  decrease pain 25%    Status  On-going            Plan -  06/06/19 1149    Clinical Impression Statement  Pt reports minimal improvement of pain at ischial tuberosity.  Today is her 3rd ionto patch.  She was a lot less tender with palpation to this area as compared to last time this therapist palpated.  She will need to advace to ther ex that place some tension on this however it will be difficulty d/t other medical issues that limited her.    Rehab Potential  Good    PT Frequency  2x / week    PT Duration  8 weeks    PT Treatment/Interventions  ADLs/Self Care Home Management;Cryotherapy;Electrical Stimulation;Moist Heat;Stair training;Functional mobility training;Neuromuscular re-education;Gait training;Balance training;Therapeutic exercise;Therapeutic activities;Patient/family education;Manual techniques    PT Next Visit Plan  cont with [place] and ionto    Consulted and Agree with Plan of Care  Patient       Patient will benefit from skilled therapeutic intervention in order to improve the following deficits and impairments:  Abnormal gait, Pain, Improper body mechanics, Postural dysfunction, Increased muscle spasms, Decreased mobility, Decreased activity tolerance, Decreased endurance, Decreased range of motion, Decreased strength, Difficulty walking, Impaired flexibility  Visit Diagnosis: Muscle spasm of back  Acute bilateral low back pain  without sciatica  Difficulty in walking, not elsewhere classified     Problem List Patient Active Problem List   Diagnosis Date Noted  . Lumbar radiculopathy 03/12/2019  . Spondylolisthesis at L4-L5 level 08/24/2018  . Facet syndrome, lumbar 08/24/2018  . Chronic SI joint pain 03/30/2018  . Delayed sleep phase syndrome 03/30/2018  . History of optic neuritis 07/19/2016  . Hyperlipidemia 06/18/2015  . H/O malignant neoplasm of breast 02/18/2015  . Gonalgia 02/18/2015  . Relapsing remitting multiple sclerosis ([place]) 02/18/2015  . Well adult exam 02/02/2015  . Abdominal pain, generalized 02/02/2015  .  Physical exam 12/17/2014  . High risk medication use 10/15/2014  . Anxiety state 10/15/2014  . Other fatigue 10/15/2014  . Obstructive sleep apnea 10/15/2014  . Restless leg syndrome 10/15/2014  . Allergic rhinitis 09/30/2014  . Pain in joint, shoulder region 10/14/2013  . Memory loss 10/14/2013  . Constipation 08/30/2013  . Depression 08/30/2013  . S/P total knee arthroplasty 08/23/2013  . Pain of right leg 08/23/2013  . Constipation due to pain medication 07/19/2013  . Hearing loss secondary to cerumen impaction 07/19/2013  . 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 Hypothyroidism 05/29/2013  . Esophageal reflux 05/29/2013  . Acquired absence of knee joint following removal of joint prosthesis with presence of antibiotic-impregnated cement spacer Left 04/19/2013  . Hx of radiation therapy   . Essential hypertension, benign 06/11/2012  . Multiple sclerosis ([place]) 01/23/2012  . Breast cancer of upper-outer quadrant of right female breast ([place]) 11/22/2011  . Osteoarthritis of both knees 08/15/2011    Jeral Pinch PT  06/06/2019, 11:50 AM  [place], [place], [address] Phone: [phone]   Fax:  [phone]  Name: Julie Ross MRN: [id number] Date of Birth: [birth year]

## 2019-06-11 ENCOUNTER — Ambulatory Visit: Payer: Commercial Managed Care - PPO | Admitting: Physical Therapy

## 2019-06-11 ENCOUNTER — Other Ambulatory Visit: Payer: Self-pay

## 2019-06-11 ENCOUNTER — Encounter: Payer: Self-pay | Admitting: Physical Therapy

## 2019-06-11 DIAGNOSIS — M545 Low back pain, unspecified: Secondary | ICD-10-CM

## 2019-06-11 DIAGNOSIS — M62830 Muscle spasm of back: Secondary | ICD-10-CM

## 2019-06-11 DIAGNOSIS — M25562 Pain in left knee: Secondary | ICD-10-CM

## 2019-06-11 DIAGNOSIS — R262 Difficulty in walking, not elsewhere classified: Secondary | ICD-10-CM

## 2019-06-11 NOTE — Therapy (Signed)
[place], [place], [address] Phone: [phone]   Fax:  [phone]  Physical Therapy Treatment  Patient Details  Name: Julie Ross MRN: [id number] Date of Birth: 1960/09/16 Referring Provider (PT): Mayer Camel   Encounter Date: 06/11/2019  PT End of Session - 06/11/19 1645    Visit Number  7    Date for PT Re-Evaluation  07/06/19    PT Start Time  [id number]    PT Stop Time  1452    PT Time Calculation (min)  54 min    Activity Tolerance  Patient tolerated treatment well       Past Medical History:  Diagnosis Date  . Anemia    h/o - ? bld. transfusion - more recent- ?2013  . Anxiety    uses xanax mainly for sleep  . Arthritis    knees   . Breast cancer ([place]) 11/2011   lul/ER+PR+, x1 lymph node   . Cancer (HCC)    squamous cell skin cancer x7  . Depression   . GERD (gastroesophageal reflux disease)   . H/O bone density study 03/2011  . H/O colonoscopy   . H/O echocardiogram    last one 02/2013, due to chemotherapy  . Heart burn   . Hx of radiation therapy 04/17/12- 06/04/12   left chest wall, axilla, L supraclavicular fossa 4500 cGy, left mastectomy scar/chest wall 5940 cGy  . Hypertension   . Hypothyroidism   . Multiple sclerosis ([place])   . Neuromuscular disorder ([place])    MS TX WITH CYMBALTA   . Personal history of chemotherapy   . Personal history of radiation therapy   . Sleep apnea    MILD SLEEP APNEA, NO (Needed) MACHINE  6 YRS AGO HPT REGIONAL   . Wears glasses     Past Surgical History:  Procedure Laterality Date  . APPLICATION OF ROBOTIC ASSISTANCE FOR SPINAL PROCEDURE N/A 03/12/2019   Procedure: APPLICATION OF ROBOTIC ASSISTANCE FOR SPINAL PROCEDURE;  Surgeon: Judith Part, MD;  Location: [place];  Service: Neurosurgery;  Laterality: N/A;  . CESAREAN SECTION     X2   . COLONOSCOPY    . ESOPHAGOGASTRODUODENOSCOPY    . FLEXIBLE SIGMOIDOSCOPY    . I & D KNEE WITH POLY  EXCHANGE  03/02/2012   Procedure: IRRIGATION AND DEBRIDEMENT KNEE WITH POLY EXCHANGE;  Surgeon: Alta Corning, MD;  Location: [place];  Service: Orthopedics;  Laterality: Left;  I&D of total knee with Possible Poly Exchange   . JOINT REPLACEMENT  Feb 2013   left knee- multiple surgeries   . KNEE ARTHROSCOPY     LT KNEE 04/2011  . KNEE ARTHROSCOPY  84   rt  . MASTECTOMY    . MASTECTOMY W/ SENTINEL NODE BIOPSY  12/19/2011   Procedure: MASTECTOMY WITH SENTINEL LYMPH NODE BIOPSY;  Surgeon: Haywood Lasso, MD;  Location: [place];  Service: General;  Laterality: Left;  left breast and left axilla   . MOHS SURGERY     right face  . NO PAST SURGERIES     BLADDER SLING  4-5 YRS AGO   . PORT-A-CATH REMOVAL  03/06/2012   Procedure: REMOVAL PORT-A-CATH;  Surgeon: Odis Hollingshead, MD;  Location: [facility];  Service: General;  Laterality: N/A;  . PORT-A-CATH REMOVAL Right 08/22/2012   Procedure: REMOVAL PORT-A-CATH;  Surgeon: Edward Jolly, MD;  Location: [facility];  Service: General;  Laterality: Right;  . PORTACATH PLACEMENT  01/16/2012   Procedure: INSERTION PORT-A-CATH;  Surgeon: Haywood Lasso, MD;  Location: [place];  Service: General;  Laterality: Right;  [place] Cath Placement   . PORTACATH PLACEMENT  05/01/2012   Procedure: INSERTION PORT-A-CATH;  Surgeon: Haywood Lasso, MD;  Location: [place];  Service: General;  Laterality: Right;  right internal jugular port-a-cath insertion  . TEE WITHOUT CARDIOVERSION  03/06/2012   Procedure: TRANSESOPHAGEAL ECHOCARDIOGRAM (TEE);  Surgeon: Josue Hector, MD;  Location: [place];  Service: Cardiovascular;  Laterality: N/A;  Rm. 2927  . TONSILLECTOMY    . TOTAL KNEE ARTHROPLASTY  08/15/2011   Procedure: TOTAL KNEE ARTHROPLASTY; lft Surgeon: Alta Corning, MD;  Location: [place];  Service: Orthopedics;  Laterality: Left;  RIGHT KNEE CORTIZONE INJECTION  . TOTAL KNEE ARTHROPLASTY Left 08/18/2012   Procedure:  Irrigation  and debridement of LEFT total knee;  Removal of total knee parts; Implant of spacers;  Surgeon: Kerin Salen, MD;  Location: [place];  Service: Orthopedics;  Laterality: Left;  . TOTAL KNEE ARTHROPLASTY Right 08/12/2013  . TOTAL KNEE ARTHROPLASTY Right 08/12/2013   Procedure: RIGHT TOTAL KNEE ARTHROPLASTY;  Surgeon: Kerin Salen, MD;  Location: [place];  Service: Orthopedics;  Laterality: Right;  . TOTAL KNEE REVISION Left 04/19/2013   Procedure: TOTAL KNEE REVISION AND REMOVAL CEMENT SPACER;  Surgeon: Kerin Salen, MD;  Location: [place];  Service: Orthopedics;  Laterality: Left;  . TRANSFORAMINAL LUMBAR INTERBODY FUSION (TLIF) WITH PEDICLE SCREW FIXATION 1 LEVEL Bilateral 03/12/2019   Procedure: Lumbar four-five Open transforaminal lumbar interbody fusion with Lumbar four-five laminectomy, bilateral Lumbar four-five Pedicle screw placement;  Surgeon: Judith Part, MD;  Location: [facility];  Service: Neurosurgery;  Laterality: Bilateral;  . TUMOR REMOVAL     FROM PELVIS AGE 70    There were no vitals filed for this visit.  Subjective Assessment - 06/11/19 1408    Subjective  Patient saw MD this AM, brings in a new script for left knee pain, she had a TKR with multiple surgeries after due to complications, reports that the knee has been hurting more recently and is much stiffer and she is having difficulty walking    Patient Stated Goals  have better ROM    Currently in Pain?  Yes    Pain Score  5     Pain Location  Knee    Pain Orientation  Left    Pain Descriptors / Indicators  Aching    Aggravating Factors   bending, walking pain up to 8/10, stairs, wames"me up at night", trying to straighten after it being bent for a long time    Pain Relieving Factors  nothing really helps the pain    Effect of Pain on Daily Activities  limits walking         [facility] PT Assessment - 06/11/19 0001      Assessment   Medical Diagnosis  left knee pain    Referring Provider (PT)  Mayer Camel    Onset  Date/Surgical Date  06/11/19      Precautions   Precautions  None      ROM / Strength   AROM / PROM / Strength  PROM      AROM   AROM Assessment Site  Knee    Right/Left Knee  Left    Left Knee Extension  12    Left Knee Flexion  85      PROM   PROM Assessment Site  Knee    Right/Left Knee  Left    Left Knee Extension  5    Left Knee Flexion  92      Strength   Strength Assessment Site  Knee    Right/Left Knee  Left    Left Knee Flexion  3+/5    Left Knee Extension  3+/5      Palpation   Palpation comment  crepitus with extension if the left knee, very poor quads and VMO, cannot get TKE.                   [facility] Adult PT Treatment/Exercise - 06/11/19 0001      Electrical Stimulation   Electrical Stimulation Location   Rt ischial tuberosity and low back, Lt knee    Electrical Stimulation Action  right ischial tuberosity    Electrical Stimulation Parameters  supine    Electrical Stimulation Goals  Pain      Ultrasound   Ultrasound Location  left knee    Ultrasound Parameters  100% 1.4w/cm2    Ultrasound Goals  Pain      Iontophoresis   Type of Iontophoresis  Dexamethasone    Location  RT ischial tub    Dose  70mA dose    Time  4 hour patch               PT Short Term Goals - 05/21/19 1145      PT SHORT TERM GOAL #1   Title  indepednent iwth initial HEP    Status  Achieved        PT Long Term Goals - 06/11/19 1650      Additional Long Term Goals   Additional Long Term Goals  Yes      PT LONG TERM GOAL #6   Title  decrease left knee pain 50%    Time  8    Period  Weeks    Status  New            Plan - 06/11/19 1647    Clinical Impression Statement  Patient brought in script for her left knee, has had multiple surgeries with TKR and a revision done a few years ago, reports that she has been having difficulty with pain, walking and loss of ROM over the past 9 months.  She reports that due to covid she has not been able to  exercise.  She has limited ROM, has some crepitus with active knee motion, the knee musculature is slack and I feel will be better with some mm tone. we will ad treatment to the knee    PT Next Visit Plan  cont with [place] and ionto add treatment to left knee    Consulted and Agree with Plan of Care  Patient       Patient will benefit from skilled therapeutic intervention in order to improve the following deficits and impairments:  Abnormal gait, Pain, Improper body mechanics, Postural dysfunction, Increased muscle spasms, Decreased mobility, Decreased activity tolerance, Decreased endurance, Decreased range of motion, Decreased strength, Difficulty walking, Impaired flexibility  Visit Diagnosis: Acute pain of left knee - Plan: PT plan of care cert/re-cert  Muscle spasm of back - Plan: PT plan of care cert/re-cert  Acute bilateral low back pain without sciatica - Plan: PT plan of care cert/re-cert  Difficulty in walking, not elsewhere classified - Plan: PT plan of care cert/re-cert     Problem List Patient Active Problem List   Diagnosis Date Noted  .  Lumbar radiculopathy 03/12/2019  . Spondylolisthesis at L4-L5 level 08/24/2018  . Facet syndrome, lumbar 08/24/2018  . Chronic SI joint pain 03/30/2018  . Delayed sleep phase syndrome 03/30/2018  . History of optic neuritis 07/19/2016  . Hyperlipidemia 06/18/2015  . H/O malignant neoplasm of breast 02/18/2015  . Gonalgia 02/18/2015  . Relapsing remitting multiple sclerosis ([place]) 02/18/2015  . Well adult exam 02/02/2015  . Abdominal pain, generalized 02/02/2015  . Physical exam 12/17/2014  . High risk medication use 10/15/2014  . Anxiety state 10/15/2014  . Other fatigue 10/15/2014  . Obstructive sleep apnea 10/15/2014  . Restless leg syndrome 10/15/2014  . Allergic rhinitis 09/30/2014  . Pain in joint, shoulder region 10/14/2013  . Memory loss 10/14/2013  . Constipation 08/30/2013  . Depression 08/30/2013  . S/P total knee  arthroplasty 08/23/2013  . Pain of right leg 08/23/2013  . Constipation due to pain medication 07/19/2013  . Hearing loss secondary to cerumen impaction 07/19/2013  . 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 Hypothyroidism 05/29/2013  . Esophageal reflux 05/29/2013  . Acquired absence of knee joint following removal of joint prosthesis with presence of antibiotic-impregnated cement spacer Left 04/19/2013  . Hx of radiation therapy   . Essential hypertension, benign 06/11/2012  . Multiple sclerosis ([place]) 01/23/2012  . Breast cancer of upper-outer quadrant of right female breast ([place]) 11/22/2011  . Osteoarthritis of both knees 08/15/2011    Sumner Boast., PT 06/11/2019, 4:54 PM  [place], [place], [address] Phone: [phone]   Fax:  [phone]  Name: Julie Ross MRN: [id number] Date of Birth: 1961-04-08

## 2019-06-12 ENCOUNTER — Other Ambulatory Visit: Payer: Self-pay | Admitting: Family Medicine

## 2019-06-17 ENCOUNTER — Encounter: Payer: Self-pay | Admitting: Physical Therapy

## 2019-06-17 ENCOUNTER — Other Ambulatory Visit: Payer: Self-pay

## 2019-06-17 ENCOUNTER — Ambulatory Visit: Payer: Commercial Managed Care - PPO | Admitting: Physical Therapy

## 2019-06-17 DIAGNOSIS — M545 Low back pain, unspecified: Secondary | ICD-10-CM

## 2019-06-17 DIAGNOSIS — M62830 Muscle spasm of back: Secondary | ICD-10-CM

## 2019-06-17 DIAGNOSIS — R262 Difficulty in walking, not elsewhere classified: Secondary | ICD-10-CM

## 2019-06-17 DIAGNOSIS — M25562 Pain in left knee: Secondary | ICD-10-CM

## 2019-06-17 NOTE — Therapy (Signed)
[place], [place], [address] Phone: [phone]   Fax:  [phone]  Physical Therapy Treatment  Patient Details  Name: Julie Ross MRN: [id number] Date of Birth: [birth year] Referring Provider (PT): Mayer Camel   Encounter Date: 06/17/2019  PT End of Session - 06/17/19 1216    Visit Number  8    Date for PT Re-Evaluation  07/06/19    PT Start Time  1019    PT Stop Time  1116    PT Time Calculation (min)  57 min    Activity Tolerance  Patient tolerated treatment well    Behavior During Therapy  [facility] for tasks assessed/performed       Past Medical History:  Diagnosis Date  . Anemia    h/o - ? bld. transfusion - more recent- ?2013  . Anxiety    uses xanax mainly for sleep  . Arthritis    knees   . Breast cancer ([place]) 11/2011   lul/ER+PR+, x1 lymph node   . Cancer (HCC)    squamous cell skin cancer x7  . Depression   . GERD (gastroesophageal reflux disease)   . H/O bone density study 03/2011  . H/O colonoscopy   . H/O echocardiogram    last one 02/2013, due to chemotherapy  . Heart burn   . Hx of radiation therapy 04/17/12- 06/04/12   left chest wall, axilla, L supraclavicular fossa 4500 cGy, left mastectomy scar/chest wall 5940 cGy  . Hypertension   . Hypothyroidism   . Multiple sclerosis ([place])   . Neuromuscular disorder ([place])    MS TX WITH CYMBALTA   . Personal history of chemotherapy   . Personal history of radiation therapy   . Sleep apnea    MILD SLEEP APNEA, NO (Needed) MACHINE  6 YRS AGO HPT REGIONAL   . Wears glasses     Past Surgical History:  Procedure Laterality Date  . APPLICATION OF ROBOTIC ASSISTANCE FOR SPINAL PROCEDURE N/A 03/12/2019   Procedure: APPLICATION OF ROBOTIC ASSISTANCE FOR SPINAL PROCEDURE;  Surgeon: Judith Part, MD;  Location: [place];  Service: Neurosurgery;  Laterality: N/A;  . CESAREAN SECTION     X2   . COLONOSCOPY    .  ESOPHAGOGASTRODUODENOSCOPY    . FLEXIBLE SIGMOIDOSCOPY    . I & D KNEE WITH POLY EXCHANGE  03/02/2012   Procedure: IRRIGATION AND DEBRIDEMENT KNEE WITH POLY EXCHANGE;  Surgeon: Alta Corning, MD;  Location: [place];  Service: Orthopedics;  Laterality: Left;  I&D of total knee with Possible Poly Exchange   . JOINT REPLACEMENT  Feb 2013   left knee- multiple surgeries   . KNEE ARTHROSCOPY     LT KNEE 04/2011  . KNEE ARTHROSCOPY  84   rt  . MASTECTOMY    . MASTECTOMY W/ SENTINEL NODE BIOPSY  12/19/2011   Procedure: MASTECTOMY WITH SENTINEL LYMPH NODE BIOPSY;  Surgeon: Haywood Lasso, MD;  Location: [place];  Service: General;  Laterality: Left;  left breast and left axilla   . MOHS SURGERY     right face  . NO PAST SURGERIES     BLADDER SLING  4-5 YRS AGO   . PORT-A-CATH REMOVAL  03/06/2012   Procedure: REMOVAL PORT-A-CATH;  Surgeon: Odis Hollingshead, MD;  Location: [place];  Service: General;  Laterality: N/A;  . PORT-A-CATH REMOVAL Right 08/22/2012   Procedure: REMOVAL PORT-A-CATH;  Surgeon: Edward Jolly, MD;  Location: [facility]  OR;  Service: General;  Laterality: Right;  . PORTACATH PLACEMENT  01/16/2012   Procedure: INSERTION PORT-A-CATH;  Surgeon: Haywood Lasso, MD;  Location: [place];  Service: General;  Laterality: Right;  [place] Cath Placement   . PORTACATH PLACEMENT  05/01/2012   Procedure: INSERTION PORT-A-CATH;  Surgeon: Haywood Lasso, MD;  Location: [place];  Service: General;  Laterality: Right;  right internal jugular port-a-cath insertion  . TEE WITHOUT CARDIOVERSION  03/06/2012   Procedure: TRANSESOPHAGEAL ECHOCARDIOGRAM (TEE);  Surgeon: Josue Hector, MD;  Location: [place];  Service: Cardiovascular;  Laterality: N/A;  Rm. 2927  . TONSILLECTOMY    . TOTAL KNEE ARTHROPLASTY  08/15/2011   Procedure: TOTAL KNEE ARTHROPLASTY; lft Surgeon: Alta Corning, MD;  Location: [place];  Service: Orthopedics;  Laterality: Left;  RIGHT KNEE  CORTIZONE INJECTION  . TOTAL KNEE ARTHROPLASTY Left 08/18/2012   Procedure:  Irrigation and debridement of LEFT total knee;  Removal of total knee parts; Implant of spacers;  Surgeon: Kerin Salen, MD;  Location: [place];  Service: Orthopedics;  Laterality: Left;  . TOTAL KNEE ARTHROPLASTY Right 08/12/2013  . TOTAL KNEE ARTHROPLASTY Right 08/12/2013   Procedure: RIGHT TOTAL KNEE ARTHROPLASTY;  Surgeon: Kerin Salen, MD;  Location: [place];  Service: Orthopedics;  Laterality: Right;  . TOTAL KNEE REVISION Left 04/19/2013   Procedure: TOTAL KNEE REVISION AND REMOVAL CEMENT SPACER;  Surgeon: Kerin Salen, MD;  Location: [place];  Service: Orthopedics;  Laterality: Left;  . TRANSFORAMINAL LUMBAR INTERBODY FUSION (TLIF) WITH PEDICLE SCREW FIXATION 1 LEVEL Bilateral 03/12/2019   Procedure: Lumbar four-five Open transforaminal lumbar interbody fusion with Lumbar four-five laminectomy, bilateral Lumbar four-five Pedicle screw placement;  Surgeon: Judith Part, MD;  Location: [place];  Service: Neurosurgery;  Laterality: Bilateral;  . TUMOR REMOVAL     FROM PELVIS AGE 6    There were no vitals filed for this visit.  Subjective Assessment - 06/17/19 1034    Subjective  Patient reports a little more sore after doing more at Christmas, tight and stiff    Currently in Pain?  Yes    Pain Score  4     Pain Location  Knee   and right buttock   Pain Orientation  Left    Pain Descriptors / Indicators  Aching;Tightness    Aggravating Factors   activity, bending gthe knee is really tight                       [facility] Adult PT Treatment/Exercise - 06/17/19 0001      Lumbar Exercises: Stretches   Passive Hamstring Stretch  Left;Right;4 reps;20 seconds    Piriformis Stretch  Right;Left;4 reps;20 seconds      Lumbar Exercises: Aerobic   Recumbent Bike  x 5 minutes able to do full revolutions    Nustep  L5x5'      Lumbar Exercises: Supine   Other Supine Lumbar Exercises  hooklying ball b/n  knees squeeze    Other Supine Lumbar Exercises  feet on ball K2C, trunk rotation and small bridges, isometric abs, the bridging was stopped due to left ischial pain      Electrical Stimulation   Electrical Stimulation Location  right ischial tuberosity    Electrical Stimulation Action  IFC    Electrical Stimulation Parameters  supine    Electrical Stimulation Goals  Pain      Manual Therapy   Manual Therapy  Soft tissue mobilization;Passive ROM    Soft tissue mobilization  left superior patellar area and the right ischial area               PT Short Term Goals - 05/21/19 1145      PT SHORT TERM GOAL #1   Title  indepednent iwth initial HEP    Status  Achieved        PT Long Term Goals - 06/11/19 1650      Additional Long Term Goals   Additional Long Term Goals  Yes      PT LONG TERM GOAL #6   Title  decrease left knee pain 50%    Time  8    Period  Weeks    Status  New            Plan - 06/17/19 1217    Clinical Impression Statement  Able to go around on the bike today, some cues to decrease the trunk lean, bridges caused the ischial pain had to stop.  She is tender here and tight in the HS with a 90/90 type stretch    PT Next Visit Plan  may work on [employer] of the ischial area    Consulted and Agree with Plan of Care  Patient       Patient will benefit from skilled therapeutic intervention in order to improve the following deficits and impairments:  Abnormal gait, Pain, Improper body mechanics, Postural dysfunction, Increased muscle spasms, Decreased mobility, Decreased activity tolerance, Decreased endurance, Decreased range of motion, Decreased strength, Difficulty walking, Impaired flexibility  Visit Diagnosis: Acute pain of left knee  Muscle spasm of back  Acute bilateral low back pain without sciatica  Difficulty in walking, not elsewhere classified     Problem List Patient Active Problem List   Diagnosis Date Noted  . Lumbar radiculopathy  03/12/2019  . Spondylolisthesis at L4-L5 level 08/24/2018  . Facet syndrome, lumbar 08/24/2018  . Chronic SI joint pain 03/30/2018  . Delayed sleep phase syndrome 03/30/2018  . History of optic neuritis 07/19/2016  . Hyperlipidemia 06/18/2015  . H/O malignant neoplasm of breast 02/18/2015  . Gonalgia 02/18/2015  . Relapsing remitting multiple sclerosis ([place]) 02/18/2015  . Well adult exam 02/02/2015  . Abdominal pain, generalized 02/02/2015  . Physical exam 12/17/2014  . High risk medication use 10/15/2014  . Anxiety state 10/15/2014  . Other fatigue 10/15/2014  . Obstructive sleep apnea 10/15/2014  . Restless leg syndrome 10/15/2014  . Allergic rhinitis 09/30/2014  . Pain in joint, shoulder region 10/14/2013  . Memory loss 10/14/2013  . Constipation 08/30/2013  . Depression 08/30/2013  . S/P total knee arthroplasty 08/23/2013  . Pain of right leg 08/23/2013  . Constipation due to pain medication 07/19/2013  . Hearing loss secondary to cerumen impaction 07/19/2013  . 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 Hypothyroidism 05/29/2013  . Esophageal reflux 05/29/2013  . Acquired absence of knee joint following removal of joint prosthesis with presence of antibiotic-impregnated cement spacer Left 04/19/2013  . Hx of radiation therapy   . Essential hypertension, benign 06/11/2012  . Multiple sclerosis ([place]) 01/23/2012  . Breast cancer of upper-outer quadrant of right female breast ([place]) 11/22/2011  . Osteoarthritis of both knees 08/15/2011    Sumner Boast., PT 06/17/2019, 12:18 PM  [place] [address] [place], [place], [address] Phone: [phone]   Fax:  [phone]  Name: SHALAINE FRYMOYER MRN: [id number]  Date of Birth: May 03, 1961

## 2019-06-18 ENCOUNTER — Telehealth: Payer: Self-pay | Admitting: Hematology and Oncology

## 2019-06-18 NOTE — Telephone Encounter (Signed)
Rescheduled per MD. Called and left VM. Mailed printout

## 2019-06-19 ENCOUNTER — Ambulatory Visit: Payer: Commercial Managed Care - PPO | Admitting: Physical Therapy

## 2019-06-19 ENCOUNTER — Other Ambulatory Visit: Payer: Self-pay

## 2019-06-19 ENCOUNTER — Encounter: Payer: Self-pay | Admitting: Physical Therapy

## 2019-06-19 DIAGNOSIS — M545 Low back pain, unspecified: Secondary | ICD-10-CM

## 2019-06-19 DIAGNOSIS — R262 Difficulty in walking, not elsewhere classified: Secondary | ICD-10-CM

## 2019-06-19 DIAGNOSIS — M25562 Pain in left knee: Secondary | ICD-10-CM

## 2019-06-19 DIAGNOSIS — M62830 Muscle spasm of back: Secondary | ICD-10-CM

## 2019-06-19 NOTE — Therapy (Signed)
[place], [place], [address] Phone: [phone]   Fax:  [phone]  Physical Therapy Treatment  Patient Details  Name: Julie Ross MRN: [id number] Date of Birth: [birth year] Referring Provider (PT): Mayer Camel   Encounter Date: 06/19/2019  PT End of Session - 06/19/19 1200    Visit Number  9    Date for PT Re-Evaluation  07/06/19    PT Start Time  1016    PT Stop Time  1113    PT Time Calculation (min)  57 min    Activity Tolerance  Patient tolerated treatment well    Behavior During Therapy  [facility] for tasks assessed/performed       Past Medical History:  Diagnosis Date  . Anemia    h/o - ? bld. transfusion - more recent- ?2013  . Anxiety    uses xanax mainly for sleep  . Arthritis    knees   . Breast cancer ([place]) 11/2011   lul/ER+PR+, x1 lymph node   . Cancer (HCC)    squamous cell skin cancer x7  . Depression   . GERD (gastroesophageal reflux disease)   . H/O bone density study 03/2011  . H/O colonoscopy   . H/O echocardiogram    last one 02/2013, due to chemotherapy  . Heart burn   . Hx of radiation therapy 04/17/12- 06/04/12   left chest wall, axilla, L supraclavicular fossa 4500 cGy, left mastectomy scar/chest wall 5940 cGy  . Hypertension   . Hypothyroidism   . Multiple sclerosis ([place])   . Neuromuscular disorder ([place])    MS TX WITH CYMBALTA   . Personal history of chemotherapy   . Personal history of radiation therapy   . Sleep apnea    MILD SLEEP APNEA, NO (Needed) MACHINE  6 YRS AGO HPT REGIONAL   . Wears glasses     Past Surgical History:  Procedure Laterality Date  . APPLICATION OF ROBOTIC ASSISTANCE FOR SPINAL PROCEDURE N/A 03/12/2019   Procedure: APPLICATION OF ROBOTIC ASSISTANCE FOR SPINAL PROCEDURE;  Surgeon: Judith Part, MD;  Location: [place];  Service: Neurosurgery;  Laterality: N/A;  . CESAREAN SECTION     X2   . COLONOSCOPY    .  ESOPHAGOGASTRODUODENOSCOPY    . FLEXIBLE SIGMOIDOSCOPY    . I & D KNEE WITH POLY EXCHANGE  03/02/2012   Procedure: IRRIGATION AND DEBRIDEMENT KNEE WITH POLY EXCHANGE;  Surgeon: Alta Corning, MD;  Location: [place];  Service: Orthopedics;  Laterality: Left;  I&D of total knee with Possible Poly Exchange   . JOINT REPLACEMENT  Feb 2013   left knee- multiple surgeries   . KNEE ARTHROSCOPY     LT KNEE 04/2011  . KNEE ARTHROSCOPY  84   rt  . MASTECTOMY    . MASTECTOMY W/ SENTINEL NODE BIOPSY  12/19/2011   Procedure: MASTECTOMY WITH SENTINEL LYMPH NODE BIOPSY;  Surgeon: Haywood Lasso, MD;  Location: [place];  Service: General;  Laterality: Left;  left breast and left axilla   . MOHS SURGERY     right face  . NO PAST SURGERIES     BLADDER SLING  4-5 YRS AGO   . PORT-A-CATH REMOVAL  03/06/2012   Procedure: REMOVAL PORT-A-CATH;  Surgeon: Odis Hollingshead, MD;  Location: [place];  Service: General;  Laterality: N/A;  . PORT-A-CATH REMOVAL Right 08/22/2012   Procedure: REMOVAL PORT-A-CATH;  Surgeon: Edward Jolly, MD;  Location: [facility]  OR;  Service: General;  Laterality: Right;  . PORTACATH PLACEMENT  01/16/2012   Procedure: INSERTION PORT-A-CATH;  Surgeon: Haywood Lasso, MD;  Location: [place];  Service: General;  Laterality: Right;  [place] Cath Placement   . PORTACATH PLACEMENT  05/01/2012   Procedure: INSERTION PORT-A-CATH;  Surgeon: Haywood Lasso, MD;  Location: [place];  Service: General;  Laterality: Right;  right internal jugular port-a-cath insertion  . TEE WITHOUT CARDIOVERSION  03/06/2012   Procedure: TRANSESOPHAGEAL ECHOCARDIOGRAM (TEE);  Surgeon: Josue Hector, MD;  Location: [place];  Service: Cardiovascular;  Laterality: N/A;  Rm. 2927  . TONSILLECTOMY    . TOTAL KNEE ARTHROPLASTY  08/15/2011   Procedure: TOTAL KNEE ARTHROPLASTY; lft Surgeon: Alta Corning, MD;  Location: [place];  Service: Orthopedics;  Laterality: Left;  RIGHT KNEE  CORTIZONE INJECTION  . TOTAL KNEE ARTHROPLASTY Left 08/18/2012   Procedure:  Irrigation and debridement of LEFT total knee;  Removal of total knee parts; Implant of spacers;  Surgeon: Kerin Salen, MD;  Location: [place];  Service: Orthopedics;  Laterality: Left;  . TOTAL KNEE ARTHROPLASTY Right 08/12/2013  . TOTAL KNEE ARTHROPLASTY Right 08/12/2013   Procedure: RIGHT TOTAL KNEE ARTHROPLASTY;  Surgeon: Kerin Salen, MD;  Location: [place];  Service: Orthopedics;  Laterality: Right;  . TOTAL KNEE REVISION Left 04/19/2013   Procedure: TOTAL KNEE REVISION AND REMOVAL CEMENT SPACER;  Surgeon: Kerin Salen, MD;  Location: [place];  Service: Orthopedics;  Laterality: Left;  . TRANSFORAMINAL LUMBAR INTERBODY FUSION (TLIF) WITH PEDICLE SCREW FIXATION 1 LEVEL Bilateral 03/12/2019   Procedure: Lumbar four-five Open transforaminal lumbar interbody fusion with Lumbar four-five laminectomy, bilateral Lumbar four-five Pedicle screw placement;  Surgeon: Judith Part, MD;  Location: [place];  Service: Neurosurgery;  Laterality: Bilateral;  . TUMOR REMOVAL     FROM PELVIS AGE 61    There were no vitals filed for this visit.  Subjective Assessment - 06/19/19 1145    Subjective  Patient reports very stiff today    Currently in Pain?  Yes    Pain Score  5     Pain Location  Knee   buttock                      [facility] Adult PT Treatment/Exercise - 06/19/19 0001      Lumbar Exercises: Aerobic   Recumbent Bike  x 5 minutes able to do full revolutions alot more difficulty       Lumbar Exercises: Supine   Ab Set  10 reps;3 seconds    Other Supine Lumbar Exercises  hooklying ball b/n knees squeeze    Other Supine Lumbar Exercises  feet on ball K2C, trunk rotation and small bridges, isometric abs, the bridging was stopped due to left ischial pain      Electrical Stimulation   Electrical Stimulation Location  right ischial tuberosity    Electrical Stimulation Action  IFC    Electrical Stimulation  Parameters  supine    Electrical Stimulation Goals  Pain      Ultrasound   Ultrasound Location  right ischial tuberosity    Ultrasound Parameters  100% 1.4 w/cm2    Ultrasound Goals  Pain      Iontophoresis   Type of Iontophoresis  Dexamethasone    Location  RT ischial tub    Dose  2m dose    Time  4 hour patch      Manual  Therapy   Manual Therapy  Soft tissue mobilization;Passive ROM    Soft tissue mobilization  right ischial area and into the HS               PT Short Term Goals - 05/21/19 1145      PT SHORT TERM GOAL #1   Title  indepednent iwth initial HEP    Status  Achieved        PT Long Term Goals - 06/19/19 1202      PT LONG TERM GOAL #1   Title  understand posture and body mechanics    Status  Partially Met      PT LONG TERM GOAL #2   Title  increase lumbar ROM 25%    Status  Partially Met            Plan - 06/19/19 1201    Clinical Impression Statement  very stiff today, could not go around much on the bike had to do partial revs.  She has very tender area at the ischial tuberosity and into the HS origin.    PT Next Visit Plan  see how the STM did with the [place]    Consulted and Agree with Plan of Care  Patient       Patient will benefit from skilled therapeutic intervention in order to improve the following deficits and impairments:  Abnormal gait, Pain, Improper body mechanics, Postural dysfunction, Increased muscle spasms, Decreased mobility, Decreased activity tolerance, Decreased endurance, Decreased range of motion, Decreased strength, Difficulty walking, Impaired flexibility  Visit Diagnosis: Acute pain of left knee  Muscle spasm of back  Acute bilateral low back pain without sciatica  Difficulty in walking, not elsewhere classified     Problem List Patient Active Problem List   Diagnosis Date Noted  . Lumbar radiculopathy 03/12/2019  . Spondylolisthesis at L4-L5 level 08/24/2018  . Facet syndrome, lumbar 08/24/2018  .  Chronic SI joint pain 03/30/2018  . Delayed sleep phase syndrome 03/30/2018  . History of optic neuritis 07/19/2016  . Hyperlipidemia 06/18/2015  . H/O malignant neoplasm of breast 02/18/2015  . Gonalgia 02/18/2015  . Relapsing remitting multiple sclerosis ([place]) 02/18/2015  . Well adult exam 02/02/2015  . Abdominal pain, generalized 02/02/2015  . Physical exam 12/17/2014  . High risk medication use 10/15/2014  . Anxiety state 10/15/2014  . Other fatigue 10/15/2014  . Obstructive sleep apnea 10/15/2014  . Restless leg syndrome 10/15/2014  . Allergic rhinitis 09/30/2014  . Pain in joint, shoulder region 10/14/2013  . Memory loss 10/14/2013  . Constipation 08/30/2013  . Depression 08/30/2013  . S/P total knee arthroplasty 08/23/2013  . Pain of right leg 08/23/2013  . Constipation due to pain medication 07/19/2013  . Hearing loss secondary to cerumen impaction 07/19/2013  . 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 Hypothyroidism 05/29/2013  . Esophageal reflux 05/29/2013  . Acquired absence of knee joint following removal of joint prosthesis with presence of antibiotic-impregnated cement spacer Left 04/19/2013  . Hx of radiation therapy   . Essential hypertension, benign 06/11/2012  . Multiple sclerosis ([place]) 01/23/2012  . Breast cancer of upper-outer quadrant of right female breast ([place]) 11/22/2011  . Osteoarthritis of both knees 08/15/2011    Sumner Boast., PT 06/19/2019, 12:03 PM  [place] [address] [place], [place], [address] Phone: [phone]   Fax:  [phone]  Name: Julie Ross MRN: [id number] Date of Birth: [birth year]

## 2019-06-24 ENCOUNTER — Ambulatory Visit: Payer: Commercial Managed Care - PPO | Attending: Neurological Surgery | Admitting: Physical Therapy

## 2019-06-24 ENCOUNTER — Other Ambulatory Visit: Payer: Self-pay

## 2019-06-24 ENCOUNTER — Encounter: Payer: Self-pay | Admitting: Physical Therapy

## 2019-06-24 ENCOUNTER — Telehealth: Payer: Self-pay | Admitting: Hematology and Oncology

## 2019-06-24 DIAGNOSIS — R262 Difficulty in walking, not elsewhere classified: Secondary | ICD-10-CM

## 2019-06-24 DIAGNOSIS — M545 Low back pain, unspecified: Secondary | ICD-10-CM

## 2019-06-24 DIAGNOSIS — M25562 Pain in left knee: Secondary | ICD-10-CM | POA: Diagnosis present

## 2019-06-24 DIAGNOSIS — M62830 Muscle spasm of back: Secondary | ICD-10-CM | POA: Diagnosis present

## 2019-06-24 NOTE — Telephone Encounter (Signed)
Returned patient's phone call regarding rescheduling 01/15 appointment, per patient's request appointment has moved to 01/19.

## 2019-06-24 NOTE — Therapy (Signed)
[place], [place], [address] Phone: [phone]   Fax:  [phone]  Physical Therapy Treatment  Patient Details  Name: Julie Ross MRN: [id number] Date of Birth: Mar 02, 1961 Referring Provider (PT): Mayer Camel   Encounter Date: 06/24/2019  PT End of Session - 06/24/19 1133    Visit Number  10    Date for PT Re-Evaluation  07/06/19    PT Start Time  1134    PT Stop Time  1230    PT Time Calculation (min)  56 min    Activity Tolerance  Patient tolerated treatment well    Behavior During Therapy  [facility] for tasks assessed/performed       Past Medical History:  Diagnosis Date  . Anemia    h/o - ? bld. transfusion - more recent- ?2013  . Anxiety    uses xanax mainly for sleep  . Arthritis    knees   . Breast cancer ([place]) 11/2011   lul/ER+PR+, x1 lymph node   . Cancer (HCC)    squamous cell skin cancer x7  . Depression   . GERD (gastroesophageal reflux disease)   . H/O bone density study 03/2011  . H/O colonoscopy   . H/O echocardiogram    last one 02/2013, due to chemotherapy  . Heart burn   . Hx of radiation therapy 04/17/12- 06/04/12   left chest wall, axilla, L supraclavicular fossa 4500 cGy, left mastectomy scar/chest wall 5940 cGy  . Hypertension   . Hypothyroidism   . Multiple sclerosis ([place])   . Neuromuscular disorder ([place])    MS TX WITH CYMBALTA   . Personal history of chemotherapy   . Personal history of radiation therapy   . Sleep apnea    MILD SLEEP APNEA, NO (Needed) MACHINE  6 YRS AGO HPT REGIONAL   . Wears glasses     Past Surgical History:  Procedure Laterality Date  . APPLICATION OF ROBOTIC ASSISTANCE FOR SPINAL PROCEDURE N/A 03/12/2019   Procedure: APPLICATION OF ROBOTIC ASSISTANCE FOR SPINAL PROCEDURE;  Surgeon: Judith Part, MD;  Location: [place];  Service: Neurosurgery;  Laterality: N/A;  . CESAREAN SECTION     X2   . COLONOSCOPY    .  ESOPHAGOGASTRODUODENOSCOPY    . FLEXIBLE SIGMOIDOSCOPY    . I & D KNEE WITH POLY EXCHANGE  03/02/2012   Procedure: IRRIGATION AND DEBRIDEMENT KNEE WITH POLY EXCHANGE;  Surgeon: Alta Corning, MD;  Location: [facility];  Service: Orthopedics;  Laterality: Left;  I&D of total knee with Possible Poly Exchange   . JOINT REPLACEMENT  Feb 2013   left knee- multiple surgeries   . KNEE ARTHROSCOPY     LT KNEE 04/2011  . KNEE ARTHROSCOPY  84   rt  . MASTECTOMY    . MASTECTOMY W/ SENTINEL NODE BIOPSY  12/19/2011   Procedure: MASTECTOMY WITH SENTINEL LYMPH NODE BIOPSY;  Surgeon: Haywood Lasso, MD;  Location: [facility];  Service: General;  Laterality: Left;  left breast and left axilla   . MOHS SURGERY     right face  . NO PAST SURGERIES     BLADDER SLING  4-5 YRS AGO   . PORT-A-CATH REMOVAL  03/06/2012   Procedure: REMOVAL PORT-A-CATH;  Surgeon: Odis Hollingshead, MD;  Location: [place];  Service: General;  Laterality: N/A;  . PORT-A-CATH REMOVAL Right 08/22/2012   Procedure: REMOVAL PORT-A-CATH;  Surgeon: Edward Jolly, MD;  Location: [facility]  OR;  Service: General;  Laterality: Right;  . PORTACATH PLACEMENT  01/16/2012   Procedure: INSERTION PORT-A-CATH;  Surgeon: Haywood Lasso, MD;  Location: [place];  Service: General;  Laterality: Right;  [place] Cath Placement   . PORTACATH PLACEMENT  05/01/2012   Procedure: INSERTION PORT-A-CATH;  Surgeon: Haywood Lasso, MD;  Location: [place];  Service: General;  Laterality: Right;  right internal jugular port-a-cath insertion  . TEE WITHOUT CARDIOVERSION  03/06/2012   Procedure: TRANSESOPHAGEAL ECHOCARDIOGRAM (TEE);  Surgeon: Josue Hector, MD;  Location: [facility];  Service: Cardiovascular;  Laterality: N/A;  Rm. 2927  . TONSILLECTOMY    . TOTAL KNEE ARTHROPLASTY  08/15/2011   Procedure: TOTAL KNEE ARTHROPLASTY; lft Surgeon: Alta Corning, MD;  Location: [place];  Service: Orthopedics;  Laterality: Left;  RIGHT KNEE  CORTIZONE INJECTION  . TOTAL KNEE ARTHROPLASTY Left 08/18/2012   Procedure:  Irrigation and debridement of LEFT total knee;  Removal of total knee parts; Implant of spacers;  Surgeon: Kerin Salen, MD;  Location: [place];  Service: Orthopedics;  Laterality: Left;  . TOTAL KNEE ARTHROPLASTY Right 08/12/2013  . TOTAL KNEE ARTHROPLASTY Right 08/12/2013   Procedure: RIGHT TOTAL KNEE ARTHROPLASTY;  Surgeon: Kerin Salen, MD;  Location: [place];  Service: Orthopedics;  Laterality: Right;  . TOTAL KNEE REVISION Left 04/19/2013   Procedure: TOTAL KNEE REVISION AND REMOVAL CEMENT SPACER;  Surgeon: Kerin Salen, MD;  Location: [place];  Service: Orthopedics;  Laterality: Left;  . TRANSFORAMINAL LUMBAR INTERBODY FUSION (TLIF) WITH PEDICLE SCREW FIXATION 1 LEVEL Bilateral 03/12/2019   Procedure: Lumbar four-five Open transforaminal lumbar interbody fusion with Lumbar four-five laminectomy, bilateral Lumbar four-five Pedicle screw placement;  Surgeon: Judith Part, MD;  Location: [place];  Service: Neurosurgery;  Laterality: Bilateral;  . TUMOR REMOVAL     FROM PELVIS AGE 69    There were no vitals filed for this visit.  Subjective Assessment - 06/24/19 1136    Subjective  still having tightness and pain in the lt knee the ischial tuberosity area is feeling better    Patient Stated Goals  have better ROM    Currently in Pain?  Yes    Pain Score  7     Pain Location  Knee    Pain Orientation  Left    Pain Descriptors / Indicators  Tightness         [facility] PT Assessment - 06/24/19 0001      Assessment   Next MD Visit  07/11/2019                   [facility] Adult PT Treatment/Exercise - 06/24/19 0001      Lumbar Exercises: Aerobic   Recumbent Bike  x5' with full revolutions       Knee/Hip Exercises: Stretches   Sports administrator  Both;2 reps   45 sec hold, prone with strap     Knee/Hip Exercises: Standing   SLS  10 reps each with vectors , FWD lean, and hip rotation       Knee/Hip  Exercises: Supine   Short Arc Quad Sets  Strengthening;Left;10 reps      Ultrasound   Ultrasound Location  combo Rt ischial tub, thermal to Lt anterior knee    Ultrasound Parameters  100%, 1.52mz, 1.5 w/cm2    Ultrasound Goals  Pain      Iontophoresis   Type of Iontophoresis  Dexamethasone    Location  RT ischial tub    Dose  60m dose    Time  4 hour patch             PT Education - 06/24/19 1233    Education Details  HEP    Person(s) Educated  Patient    Methods  Explanation;Demonstration;Handout    Comprehension  Returned demonstration;Verbalized understanding       PT Short Term Goals - 05/21/19 1145      PT SHORT TERM GOAL #1   Title  indepednent iwth initial HEP    Status  Achieved        PT Long Term Goals - 06/19/19 1202      PT LONG TERM GOAL #1   Title  understand posture and body mechanics    Status  Partially Met      PT LONG TERM GOAL #2   Title  increase lumbar ROM 25%    Status  Partially Met            Plan - 06/24/19 1234    Clinical Impression Statement  Lt knne continues to give her some pain and tightness, limiting functional mobility.  She reports the only time she has pain now inthe Rt ischial area is with ascending stairs. HEP was modified today to place work work in this area as well as provide stability in LFirthknee.  She has difficulty performing htese types of exercise d/t other medical issues.    Rehab Potential  Good    PT Frequency  2x / week    PT Duration  8 weeks    PT Treatment/Interventions  ADLs/Self Care Home Management;Cryotherapy;Electrical Stimulation;Moist Heat;Stair training;Functional mobility training;Neuromuscular re-education;Gait training;Balance training;Therapeutic exercise;Therapeutic activities;Patient/family education;Manual techniques    PT Next Visit Plan  assess tolerance of new HEP    Consulted and Agree with Plan of Care  Patient       Patient will benefit from skilled therapeutic intervention in  order to improve the following deficits and impairments:  Abnormal gait, Pain, Improper body mechanics, Postural dysfunction, Increased muscle spasms, Decreased mobility, Decreased activity tolerance, Decreased endurance, Decreased range of motion, Decreased strength, Difficulty walking, Impaired flexibility  Visit Diagnosis: Acute pain of left knee  Muscle spasm of back  Acute bilateral low back pain without sciatica  Difficulty in walking, not elsewhere classified     Problem List Patient Active Problem List   Diagnosis Date Noted  . Lumbar radiculopathy 03/12/2019  . Spondylolisthesis at L4-L5 level 08/24/2018  . Facet syndrome, lumbar 08/24/2018  . Chronic SI joint pain 03/30/2018  . Delayed sleep phase syndrome 03/30/2018  . History of optic neuritis 07/19/2016  . Hyperlipidemia 06/18/2015  . H/O malignant neoplasm of breast 02/18/2015  . Gonalgia 02/18/2015  . Relapsing remitting multiple sclerosis ([place] 02/18/2015  . Well adult exam 02/02/2015  . Abdominal pain, generalized 02/02/2015  . Physical exam 12/17/2014  . High risk medication use 10/15/2014  . Anxiety state 10/15/2014  . Other fatigue 10/15/2014  . Obstructive sleep apnea 10/15/2014  . Restless leg syndrome 10/15/2014  . Allergic rhinitis 09/30/2014  . Pain in joint, shoulder region 10/14/2013  . Memory loss 10/14/2013  . Constipation 08/30/2013  . Depression 08/30/2013  . S/P total knee arthroplasty 08/23/2013  . Pain of right leg 08/23/2013  . Constipation due to pain medication 07/19/2013  . Hearing loss secondary to cerumen impaction 07/19/2013  . 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 Hypothyroidism 05/29/2013  . Esophageal reflux 05/29/2013  .  Acquired absence of knee joint following removal of joint prosthesis with presence of antibiotic-impregnated cement spacer Left 04/19/2013  . Hx of radiation therapy   . Essential hypertension, benign 06/11/2012  . Multiple sclerosis  ([place]) 01/23/2012  . Breast cancer of upper-outer quadrant of right female breast ([place]) 11/22/2011  . Osteoarthritis of both knees 08/15/2011   Jeral Pinch PT  06/24/2019, 12:36 PM  [address] [place], [place], [address] Phone: [phone]   Fax:  [phone]  Name: Julie Ross MRN: [id number] Date of Birth: 1961-03-28

## 2019-06-26 ENCOUNTER — Encounter: Payer: Self-pay | Admitting: Physical Therapy

## 2019-06-26 ENCOUNTER — Other Ambulatory Visit: Payer: Self-pay

## 2019-06-26 ENCOUNTER — Ambulatory Visit: Payer: Commercial Managed Care - PPO | Admitting: Physical Therapy

## 2019-06-26 DIAGNOSIS — M25562 Pain in left knee: Secondary | ICD-10-CM | POA: Diagnosis not present

## 2019-06-26 DIAGNOSIS — M545 Low back pain, unspecified: Secondary | ICD-10-CM

## 2019-06-26 DIAGNOSIS — M62830 Muscle spasm of back: Secondary | ICD-10-CM

## 2019-06-26 DIAGNOSIS — R262 Difficulty in walking, not elsewhere classified: Secondary | ICD-10-CM

## 2019-06-26 NOTE — Therapy (Signed)
[place], [place], [address] Phone: [phone]   Fax:  [phone]  Physical Therapy Treatment  Patient Details  Name: Julie Ross MRN: [id number] Date of Birth: 1960/07/28 Referring Provider (PT): Mayer Camel   Encounter Date: 06/26/2019  PT End of Session - 06/26/19 1156    Visit Number  11    Date for PT Re-Evaluation  07/06/19    PT Start Time  1154   in late   PT Stop Time  1238    PT Time Calculation (min)  44 min    Activity Tolerance  Patient tolerated treatment well       Past Medical History:  Diagnosis Date  . Anemia    h/o - ? bld. transfusion - more recent- ?2013  . Anxiety    uses xanax mainly for sleep  . Arthritis    knees   . Breast cancer ([place]) 11/2011   lul/ER+PR+, x1 lymph node   . Cancer (HCC)    squamous cell skin cancer x7  . Depression   . GERD (gastroesophageal reflux disease)   . H/O bone density study 03/2011  . H/O colonoscopy   . H/O echocardiogram    last one 02/2013, due to chemotherapy  . Heart burn   . Hx of radiation therapy 04/17/12- 06/04/12   left chest wall, axilla, L supraclavicular fossa 4500 cGy, left mastectomy scar/chest wall 5940 cGy  . Hypertension   . Hypothyroidism   . Multiple sclerosis ([place])   . Neuromuscular disorder ([place])    MS TX WITH CYMBALTA   . Personal history of chemotherapy   . Personal history of radiation therapy   . Sleep apnea    MILD SLEEP APNEA, NO (Needed) MACHINE  6 YRS AGO HPT REGIONAL   . Wears glasses     Past Surgical History:  Procedure Laterality Date  . APPLICATION OF ROBOTIC ASSISTANCE FOR SPINAL PROCEDURE N/A 03/12/2019   Procedure: APPLICATION OF ROBOTIC ASSISTANCE FOR SPINAL PROCEDURE;  Surgeon: Judith Part, MD;  Location: [facility];  Service: Neurosurgery;  Laterality: N/A;  . CESAREAN SECTION     X2   . COLONOSCOPY    . ESOPHAGOGASTRODUODENOSCOPY    . FLEXIBLE SIGMOIDOSCOPY    . I & D KNEE  WITH POLY EXCHANGE  03/02/2012   Procedure: IRRIGATION AND DEBRIDEMENT KNEE WITH POLY EXCHANGE;  Surgeon: Alta Corning, MD;  Location: [facility];  Service: Orthopedics;  Laterality: Left;  I&D of total knee with Possible Poly Exchange   . JOINT REPLACEMENT  Feb 2013   left knee- multiple surgeries   . KNEE ARTHROSCOPY     LT KNEE 04/2011  . KNEE ARTHROSCOPY  84   rt  . MASTECTOMY    . MASTECTOMY W/ SENTINEL NODE BIOPSY  12/19/2011   Procedure: MASTECTOMY WITH SENTINEL LYMPH NODE BIOPSY;  Surgeon: Haywood Lasso, MD;  Location: [facility];  Service: General;  Laterality: Left;  left breast and left axilla   . MOHS SURGERY     right face  . NO PAST SURGERIES     BLADDER SLING  4-5 YRS AGO   . PORT-A-CATH REMOVAL  03/06/2012   Procedure: REMOVAL PORT-A-CATH;  Surgeon: Odis Hollingshead, MD;  Location: [place];  Service: General;  Laterality: N/A;  . PORT-A-CATH REMOVAL Right 08/22/2012   Procedure: REMOVAL PORT-A-CATH;  Surgeon: Edward Jolly, MD;  Location: [place];  Service: General;  Laterality: Right;  .  PORTACATH PLACEMENT  01/16/2012   Procedure: INSERTION PORT-A-CATH;  Surgeon: Haywood Lasso, MD;  Location: [place];  Service: General;  Laterality: Right;  [place] Cath Placement   . PORTACATH PLACEMENT  05/01/2012   Procedure: INSERTION PORT-A-CATH;  Surgeon: Haywood Lasso, MD;  Location: [facility];  Service: General;  Laterality: Right;  right internal jugular port-a-cath insertion  . TEE WITHOUT CARDIOVERSION  03/06/2012   Procedure: TRANSESOPHAGEAL ECHOCARDIOGRAM (TEE);  Surgeon: Josue Hector, MD;  Location: [facility];  Service: Cardiovascular;  Laterality: N/A;  Rm. 2927  . TONSILLECTOMY    . TOTAL KNEE ARTHROPLASTY  08/15/2011   Procedure: TOTAL KNEE ARTHROPLASTY; lft Surgeon: Alta Corning, MD;  Location: [place] [place];  Service: Orthopedics;  Laterality: Left;  RIGHT KNEE CORTIZONE INJECTION  . TOTAL KNEE ARTHROPLASTY Left 08/18/2012   Procedure:   Irrigation and debridement of LEFT total knee;  Removal of total knee parts; Implant of spacers;  Surgeon: Kerin Salen, MD;  Location: [facility];  Service: Orthopedics;  Laterality: Left;  . TOTAL KNEE ARTHROPLASTY Right 08/12/2013  . TOTAL KNEE ARTHROPLASTY Right 08/12/2013   Procedure: RIGHT TOTAL KNEE ARTHROPLASTY;  Surgeon: Kerin Salen, MD;  Location: [place] [facility];  Service: Orthopedics;  Laterality: Right;  . TOTAL KNEE REVISION Left 04/19/2013   Procedure: TOTAL KNEE REVISION AND REMOVAL CEMENT SPACER;  Surgeon: Kerin Salen, MD;  Location: [place];  Service: Orthopedics;  Laterality: Left;  . TRANSFORAMINAL LUMBAR INTERBODY FUSION (TLIF) WITH PEDICLE SCREW FIXATION 1 LEVEL Bilateral 03/12/2019   Procedure: Lumbar four-five Open transforaminal lumbar interbody fusion with Lumbar four-five laminectomy, bilateral Lumbar four-five Pedicle screw placement;  Surgeon: Judith Part, MD;  Location: [place];  Service: Neurosurgery;  Laterality: Bilateral;  . TUMOR REMOVAL     FROM PELVIS AGE 78    There were no vitals filed for this visit.  Subjective Assessment - 06/26/19 1157    Subjective  Pt reports frustration in her knee and buttocks area not being better yet.    Patient Stated Goals  have better ROM    Currently in Pain?  Yes    Pain Score  4     Pain Location  Knee    Pain Orientation  Left    Pain Descriptors / Indicators  Aching;Tightness    Pain Type  Acute pain;Surgical pain    Pain Radiating Towards  4/10 pain also in the Rt ischial tuberosity                       [facility] Adult PT Treatment/Exercise - 06/26/19 0001      Lumbar Exercises: Aerobic   Nustep  L4x6'      Knee/Hip Exercises: Standing   Terminal Knee Extension  Strengthening;Left;10 reps   5 sec hold   Terminal Knee Extension Limitations  ball on wall      Knee/Hip Exercises: Seated   Other Seated Knee/Hip Exercises  2x10 long sit SLR Lt       Knee/Hip Exercises: Supine   Other Supine Knee/Hip  Exercises  10 reps, black band, single leg press, VC for form, stopped at 8 reps left d/t knee pain,       Knee/Hip Exercises: Sidelying   Other Sidelying Knee/Hip Exercises  2x10 Rt hip kick backs with black band, mixed with 2x10 pilates pull KTC and kick back with out resiistance      Moist Heat Therapy   Number Minutes Moist  Heat  12 Minutes    Moist Heat Location  Knee   LT and Rt hip/low back during [place] and ionto     Ultrasound   Ultrasound Location  Rt ischial tub    Ultrasound Parameters  100%, 1.3 w/cm2, 1.12mz    Ultrasound Goals  Pain      Iontophoresis   Type of Iontophoresis  Dexamethasone    Location  RT ischial tub    Dose  835mdose    Time  4 hour patch      Manual Therapy   Manual Therapy  Soft tissue mobilization;Taping    Soft tissue mobilization  cross friction massage to Rt ischial tub    Kinesiotex  --   pain, and joint support, Y strip anchored low               PT Short Term Goals - 05/21/19 1145      PT SHORT TERM GOAL #1   Title  indepednent iwth initial HEP    Status  Achieved        PT Long Term Goals - 06/19/19 1202      PT LONG TERM GOAL #1   Title  understand posture and body mechanics    Status  Partially Met      PT LONG TERM GOAL #2   Title  increase lumbar ROM 25%    Status  Partially Met            Plan - 06/26/19 1322    Clinical Impression Statement  Pt is frustrated with what she feels is lack of improvement in a timely manner. It was explained that changes take time and consistency in the exercises.  Also reminded she has just now really began more strengtheing work.  Attempted Y tape to the Lt knee in hopes of supporting the joint and reducing pain    Rehab Potential  Good    PT Frequency  2x / week    PT Duration  8 weeks    PT Treatment/Interventions  ADLs/Self Care Home Management;Cryotherapy;Electrical Stimulation;Moist Heat;Stair training;Functional mobility training;Neuromuscular re-education;Gait  training;Balance training;Therapeutic exercise;Therapeutic activities;Patient/family education;Manual techniques    PT Next Visit Plan  see if tape helped, push stability around Lt knee/hip and endrange eccentric loading of Rt hamstring.    Consulted and Agree with Plan of Care  Patient       Patient will benefit from skilled therapeutic intervention in order to improve the following deficits and impairments:  Abnormal gait, Pain, Improper body mechanics, Postural dysfunction, Increased muscle spasms, Decreased mobility, Decreased activity tolerance, Decreased endurance, Decreased range of motion, Decreased strength, Difficulty walking, Impaired flexibility  Visit Diagnosis: Acute pain of left knee  Muscle spasm of back  Acute bilateral low back pain without sciatica  Difficulty in walking, not elsewhere classified     Problem List Patient Active Problem List   Diagnosis Date Noted  . Lumbar radiculopathy 03/12/2019  . Spondylolisthesis at L4-L5 level 08/24/2018  . Facet syndrome, lumbar 08/24/2018  . Chronic SI joint pain 03/30/2018  . Delayed sleep phase syndrome 03/30/2018  . History of optic neuritis 07/19/2016  . Hyperlipidemia 06/18/2015  . H/O malignant neoplasm of breast 02/18/2015  . Gonalgia 02/18/2015  . Relapsing remitting multiple sclerosis ([facility]/31/2016  . Well adult exam 02/02/2015  . Abdominal pain, generalized 02/02/2015  . Physical exam 12/17/2014  . High risk medication use 10/15/2014  . Anxiety state 10/15/2014  . Other fatigue 10/15/2014  . Obstructive sleep  apnea 10/15/2014  . Restless leg syndrome 10/15/2014  . Allergic rhinitis 09/30/2014  . Pain in joint, shoulder region 10/14/2013  . Memory loss 10/14/2013  . Constipation 08/30/2013  . Depression 08/30/2013  . S/P total knee arthroplasty 08/23/2013  . Pain of right leg 08/23/2013  . Constipation due to pain medication 07/19/2013  . Hearing loss secondary to cerumen impaction 07/19/2013  .  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 Hypothyroidism 05/29/2013  . Esophageal reflux 05/29/2013  . Acquired absence of knee joint following removal of joint prosthesis with presence of antibiotic-impregnated cement spacer Left 04/19/2013  . Hx of radiation therapy   . Essential hypertension, benign 06/11/2012  . Multiple sclerosis ([place]) 01/23/2012  . Breast cancer of upper-outer quadrant of right female breast ([place]) 11/22/2011  . Osteoarthritis of both knees 08/15/2011    Jeral Pinch PT  06/26/2019, 1:26 PM  [place], [place], [address] Phone: [phone]   Fax:  [phone]  Name: ABIR EROH MRN: [id number] Date of Birth: 11/07/1960

## 2019-06-27 ENCOUNTER — Ambulatory Visit: Payer: Commercial Managed Care - PPO | Admitting: Hematology and Oncology

## 2019-07-01 ENCOUNTER — Encounter: Payer: Commercial Managed Care - PPO | Admitting: Physical Therapy

## 2019-07-03 ENCOUNTER — Encounter: Payer: Self-pay | Admitting: Physical Therapy

## 2019-07-03 ENCOUNTER — Ambulatory Visit: Payer: Commercial Managed Care - PPO | Admitting: Physical Therapy

## 2019-07-03 ENCOUNTER — Other Ambulatory Visit: Payer: Self-pay

## 2019-07-03 ENCOUNTER — Other Ambulatory Visit: Payer: Self-pay | Admitting: Family Medicine

## 2019-07-03 DIAGNOSIS — M25562 Pain in left knee: Secondary | ICD-10-CM

## 2019-07-03 DIAGNOSIS — M62830 Muscle spasm of back: Secondary | ICD-10-CM

## 2019-07-03 DIAGNOSIS — M545 Low back pain, unspecified: Secondary | ICD-10-CM

## 2019-07-03 DIAGNOSIS — R262 Difficulty in walking, not elsewhere classified: Secondary | ICD-10-CM

## 2019-07-03 NOTE — Telephone Encounter (Signed)
Attempted to reach patient. No answer. Vm left to call back. Pt needs OV

## 2019-07-03 NOTE — Therapy (Signed)
[place], [place], [address] Phone: [phone]   Fax:  [phone]  Physical Therapy Treatment  Patient Details  Name: Julie Ross MRN: [id number] Date of Birth: May 13, 1961 Referring Provider (PT): Mayer Camel   Encounter Date: 07/03/2019  PT End of Session - 07/03/19 1150    Visit Number  12    Number of Visits  28    Date for PT Re-Evaluation  08/28/19    PT Start Time  1150    PT Stop Time  1238    PT Time Calculation (min)  48 min    Activity Tolerance  Patient tolerated treatment well    Behavior During Therapy  [facility] for tasks assessed/performed       Past Medical History:  Diagnosis Date  . Anemia    h/o - ? bld. transfusion - more recent- ?2013  . Anxiety    uses xanax mainly for sleep  . Arthritis    knees   . Breast cancer ([place]) 11/2011   lul/ER+PR+, x1 lymph node   . Cancer (HCC)    squamous cell skin cancer x7  . Depression   . GERD (gastroesophageal reflux disease)   . H/O bone density study 03/2011  . H/O colonoscopy   . H/O echocardiogram    last one 02/2013, due to chemotherapy  . Heart burn   . Hx of radiation therapy 04/17/12- 06/04/12   left chest wall, axilla, L supraclavicular fossa 4500 cGy, left mastectomy scar/chest wall 5940 cGy  . Hypertension   . Hypothyroidism   . Multiple sclerosis ([place])   . Neuromuscular disorder ([place])    MS TX WITH CYMBALTA   . Personal history of chemotherapy   . Personal history of radiation therapy   . Sleep apnea    MILD SLEEP APNEA, NO (Needed) MACHINE  6 YRS AGO HPT REGIONAL   . Wears glasses     Past Surgical History:  Procedure Laterality Date  . APPLICATION OF ROBOTIC ASSISTANCE FOR SPINAL PROCEDURE N/A 03/12/2019   Procedure: APPLICATION OF ROBOTIC ASSISTANCE FOR SPINAL PROCEDURE;  Surgeon: Judith Part, MD;  Location: [place];  Service: Neurosurgery;  Laterality: N/A;  . CESAREAN SECTION     X2   . COLONOSCOPY     . ESOPHAGOGASTRODUODENOSCOPY    . FLEXIBLE SIGMOIDOSCOPY    . I & D KNEE WITH POLY EXCHANGE  03/02/2012   Procedure: IRRIGATION AND DEBRIDEMENT KNEE WITH POLY EXCHANGE;  Surgeon: Alta Corning, MD;  Location: [place];  Service: Orthopedics;  Laterality: Left;  I&D of total knee with Possible Poly Exchange   . JOINT REPLACEMENT  Feb 2013   left knee- multiple surgeries   . KNEE ARTHROSCOPY     LT KNEE 04/2011  . KNEE ARTHROSCOPY  84   rt  . MASTECTOMY    . MASTECTOMY W/ SENTINEL NODE BIOPSY  12/19/2011   Procedure: MASTECTOMY WITH SENTINEL LYMPH NODE BIOPSY;  Surgeon: Haywood Lasso, MD;  Location: [place];  Service: General;  Laterality: Left;  left breast and left axilla   . MOHS SURGERY     right face  . NO PAST SURGERIES     BLADDER SLING  4-5 YRS AGO   . PORT-A-CATH REMOVAL  03/06/2012   Procedure: REMOVAL PORT-A-CATH;  Surgeon: Odis Hollingshead, MD;  Location: [place];  Service: General;  Laterality: N/A;  . PORT-A-CATH REMOVAL Right 08/22/2012   Procedure: REMOVAL PORT-A-CATH;  Surgeon: Edward Jolly, MD;  Location: [place];  Service: General;  Laterality: Right;  . PORTACATH PLACEMENT  01/16/2012   Procedure: INSERTION PORT-A-CATH;  Surgeon: Haywood Lasso, MD;  Location: [place];  Service: General;  Laterality: Right;  [place] Cath Placement   . PORTACATH PLACEMENT  05/01/2012   Procedure: INSERTION PORT-A-CATH;  Surgeon: Haywood Lasso, MD;  Location: [place];  Service: General;  Laterality: Right;  right internal jugular port-a-cath insertion  . TEE WITHOUT CARDIOVERSION  03/06/2012   Procedure: TRANSESOPHAGEAL ECHOCARDIOGRAM (TEE);  Surgeon: Josue Hector, MD;  Location: [place];  Service: Cardiovascular;  Laterality: N/A;  Rm. 2927  . TONSILLECTOMY    . TOTAL KNEE ARTHROPLASTY  08/15/2011   Procedure: TOTAL KNEE ARTHROPLASTY; lft Surgeon: Alta Corning, MD;  Location: [place];  Service: Orthopedics;  Laterality: Left;  RIGHT KNEE  CORTIZONE INJECTION  . TOTAL KNEE ARTHROPLASTY Left 08/18/2012   Procedure:  Irrigation and debridement of LEFT total knee;  Removal of total knee parts; Implant of spacers;  Surgeon: Kerin Salen, MD;  Location: [place];  Service: Orthopedics;  Laterality: Left;  . TOTAL KNEE ARTHROPLASTY Right 08/12/2013  . TOTAL KNEE ARTHROPLASTY Right 08/12/2013   Procedure: RIGHT TOTAL KNEE ARTHROPLASTY;  Surgeon: Kerin Salen, MD;  Location: [place];  Service: Orthopedics;  Laterality: Right;  . TOTAL KNEE REVISION Left 04/19/2013   Procedure: TOTAL KNEE REVISION AND REMOVAL CEMENT SPACER;  Surgeon: Kerin Salen, MD;  Location: [place];  Service: Orthopedics;  Laterality: Left;  . TRANSFORAMINAL LUMBAR INTERBODY FUSION (TLIF) WITH PEDICLE SCREW FIXATION 1 LEVEL Bilateral 03/12/2019   Procedure: Lumbar four-five Open transforaminal lumbar interbody fusion with Lumbar four-five laminectomy, bilateral Lumbar four-five Pedicle screw placement;  Surgeon: Judith Part, MD;  Location: [place];  Service: Neurosurgery;  Laterality: Bilateral;  . TUMOR REMOVAL     FROM PELVIS AGE 84    There were no vitals filed for this visit.  Subjective Assessment - 07/03/19 1150    Subjective  Pt reports that the tape to her knee helped. She bought some and wants to learn how to apply    Pertinent History  mastectomy, lumbar fusion, bilateral TKA.    Patient Stated Goals  have better ROM    Currently in Pain?  Yes    Pain Score  3     Pain Location  Buttocks    Pain Orientation  Left    Pain Descriptors / Indicators  Dull    Pain Type  Surgical pain    Pain Relieving Factors  TENs         [facility] PT Assessment - 07/03/19 0001      Assessment   Medical Diagnosis  left knee pain    Referring Provider (PT)  Rowan      AROM   AROM Assessment Site  Lumbar;Knee    Left Knee Extension  -10    Left Knee Flexion  90    Lumbar Flexion  to ankles pulling Rt upper gluts    Lumbar Extension  WNL    Lumbar - Right Rotation   WNL    Lumbar - Left Rotation  WNL      PROM   Right/Left Knee  Left      Strength   Strength Assessment Site  Hip;Knee    Right/Left Hip  Left    Left Hip Flexion  4+/5    Left Hip Extension  4/5  Left Hip ABduction  4-/5    Right/Left Knee  Left    Left Knee Flexion  4/5    Left Knee Extension  4-/5   with knee pain                  [facility] Adult PT Treatment/Exercise - 07/03/19 0001      Self-Care   Other Self-Care Comments   instructed in tape application to Lt knee Y strip       Lumbar Exercises: Aerobic   Nustep  L5x6'      Lumbar Exercises: Standing   Row  20 reps   staggered stance - reps on each side   Row Limitations  15#    Other Standing Lumbar Exercises  2x10 hip flex to abduction and down with minimal UE assist       Iontophoresis   Type of Iontophoresis  Dexamethasone    Location  RT ischial tub    Dose  44m dose    Time  4 hour patch      Manual Therapy   Kinesiotex  --   Y strip to LT knee to support strutures            PT Education - 07/03/19 1159    Education Details  K-tape to Lt knee Y strip    Person(s) Educated  Patient    Methods  Demonstration;Explanation    Comprehension  Verbalized understanding;Need further instruction       PT Short Term Goals - 07/03/19 1201      PT SHORT TERM GOAL #1   Title  indepednent iwth initial HEP    Status  Achieved        PT Long Term Goals - 07/03/19 1201      PT LONG TERM GOAL #1   Title  understand posture and body mechanics    Baseline  pt reports a good understanding and feels independent with this    Status  Achieved      PT LONG TERM GOAL #2   Title  increase lumbar ROM 25%    Status  Achieved      PT LONG TERM GOAL #3   Title  tolerate standing 15 minutes to cook a meal    Baseline  5 min    Time  8    Period  Weeks    Status  On-going    Target Date  08/28/19      PT LONG TERM GOAL #4   Title  walk 600 feet without rest    Status  Achieved      PT  LONG TERM GOAL #5   Title  decrease pain 25%    Baseline  50% improved    Status  Achieved      Additional Long Term Goals   Additional Long Term Goals  Yes      PT LONG TERM GOAL #6   Title  decrease left knee pain 50%    Baseline  pt only seen a couple visits with focus on knee, tape is helping her    Time  8    Period  Weeks    Status  On-going    Target Date  08/28/19      PT LONG TERM GOAL #7   Title  I with tape application to Lt knee    Time  8    Period  Weeks    Status  New    Target Date  08/28/19      PT LONG TERM GOAL #8   Title  walk =/> 1 mile with pain less than 2/10    Time  8    Period  Weeks    Status  New    Target Date  08/28/19            Plan - 07/03/19 1327    Clinical Impression Statement  Pt reports a big improvement in her back and Rt hip pain.  She has recently started treatment for her Lt knee pain post surgical.  She has partially met her back goals and making progress to the knee goals.  She has improved ROM, continues with core and lower body weakness that needs to be addressed as well as impaired proprioception.  She is having some knee pain relief with taping and will be instructed in self application.  Her progress is anticipated to be slower than expected for her age d/t dx of MS.    Comorbidities  MS, mastectomy, bilateral TKA's, lumbar surgery, reports complications in the left TKA requiring revisions    Rehab Potential  Good    PT Frequency  2x / week    PT Duration  8 weeks    PT Treatment/Interventions  ADLs/Self Care Home Management;Cryotherapy;Electrical Stimulation;Moist Heat;Stair training;Functional mobility training;Neuromuscular re-education;Gait training;Balance training;Therapeutic exercise;Therapeutic activities;Patient/family education;Manual techniques    PT Next Visit Plan  have pt apply k tape and provide feedback, lower body strengthening, perform reassessment    Consulted and Agree with Plan of Care  Patient        Patient will benefit from skilled therapeutic intervention in order to improve the following deficits and impairments:  Abnormal gait, Pain, Improper body mechanics, Postural dysfunction, Increased muscle spasms, Decreased mobility, Decreased activity tolerance, Decreased endurance, Decreased range of motion, Decreased strength, Difficulty walking, Impaired flexibility  Visit Diagnosis: Acute pain of left knee - Plan: PT plan of care cert/re-cert  Muscle spasm of back - Plan: PT plan of care cert/re-cert  Acute bilateral low back pain without sciatica - Plan: PT plan of care cert/re-cert  Difficulty in walking, not elsewhere classified - Plan: PT plan of care cert/re-cert     Problem List Patient Active Problem List   Diagnosis Date Noted  . Lumbar radiculopathy 03/12/2019  . Spondylolisthesis at L4-L5 level 08/24/2018  . Facet syndrome, lumbar 08/24/2018  . Chronic SI joint pain 03/30/2018  . Delayed sleep phase syndrome 03/30/2018  . History of optic neuritis 07/19/2016  . Hyperlipidemia 06/18/2015  . H/O malignant neoplasm of breast 02/18/2015  . Gonalgia 02/18/2015  . Relapsing remitting multiple sclerosis ([place]) 02/18/2015  . Well adult exam 02/02/2015  . Abdominal pain, generalized 02/02/2015  . Physical exam 12/17/2014  . High risk medication use 10/15/2014  . Anxiety state 10/15/2014  . Other fatigue 10/15/2014  . Obstructive sleep apnea 10/15/2014  . Restless leg syndrome 10/15/2014  . Allergic rhinitis 09/30/2014  . Pain in joint, shoulder region 10/14/2013  . Memory loss 10/14/2013  . Constipation 08/30/2013  . Depression 08/30/2013  . S/P total knee arthroplasty 08/23/2013  . Pain of right leg 08/23/2013  . Constipation due to pain medication 07/19/2013  . Hearing loss secondary to cerumen impaction 07/19/2013  . 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 Hypothyroidism 05/29/2013  . Esophageal reflux 05/29/2013  . Acquired absence of knee  joint following removal of joint prosthesis with presence of antibiotic-impregnated cement spacer Left 04/19/2013  . Hx of radiation therapy   .  Essential hypertension, benign 06/11/2012  . Multiple sclerosis ([place]) 01/23/2012  . Breast cancer of upper-outer quadrant of right female breast ([place]) 11/22/2011  . Osteoarthritis of both knees 08/15/2011    Jeral Pinch PT  07/03/2019, 1:35 PM  [place], [place], [address] Phone: [phone]   Fax:  [phone]  Name: Julie Ross MRN: [id number] Date of Birth: [birth year]

## 2019-07-04 ENCOUNTER — Ambulatory Visit: Payer: Commercial Managed Care - PPO | Admitting: Physical Therapy

## 2019-07-04 ENCOUNTER — Other Ambulatory Visit: Payer: Self-pay

## 2019-07-04 ENCOUNTER — Encounter: Payer: Self-pay | Admitting: Physical Therapy

## 2019-07-04 DIAGNOSIS — M25562 Pain in left knee: Secondary | ICD-10-CM | POA: Diagnosis not present

## 2019-07-04 DIAGNOSIS — M545 Low back pain, unspecified: Secondary | ICD-10-CM

## 2019-07-04 DIAGNOSIS — R262 Difficulty in walking, not elsewhere classified: Secondary | ICD-10-CM

## 2019-07-04 DIAGNOSIS — M62830 Muscle spasm of back: Secondary | ICD-10-CM

## 2019-07-04 NOTE — Therapy (Signed)
[place], [place], [address] Phone: [phone]   Fax:  [phone]  Physical Therapy Treatment  Patient Details  Name: Julie Ross MRN: [id number] Date of Birth: May 24, 1961 Referring Provider (PT): Mayer Camel   Encounter Date: 07/04/2019  PT End of Session - 07/04/19 1231    Visit Number  13    Date for PT Re-Evaluation  08/28/19    PT Start Time  1145    PT Stop Time  1238    PT Time Calculation (min)  53 min    Activity Tolerance  Patient limited by pain    Behavior During Therapy  [facility] for tasks assessed/performed       Past Medical History:  Diagnosis Date  . Anemia    h/o - ? bld. transfusion - more recent- ?2013  . Anxiety    uses xanax mainly for sleep  . Arthritis    knees   . Breast cancer ([place]) 11/2011   lul/ER+PR+, x1 lymph node   . Cancer (HCC)    squamous cell skin cancer x7  . Depression   . GERD (gastroesophageal reflux disease)   . H/O bone density study 03/2011  . H/O colonoscopy   . H/O echocardiogram    last one 02/2013, due to chemotherapy  . Heart burn   . Hx of radiation therapy 04/17/12- 06/04/12   left chest wall, axilla, L supraclavicular fossa 4500 cGy, left mastectomy scar/chest wall 5940 cGy  . Hypertension   . Hypothyroidism   . Multiple sclerosis ([place])   . Neuromuscular disorder ([place])    MS TX WITH CYMBALTA   . Personal history of chemotherapy   . Personal history of radiation therapy   . Sleep apnea    MILD SLEEP APNEA, NO (Needed) MACHINE  6 YRS AGO HPT REGIONAL   . Wears glasses     Past Surgical History:  Procedure Laterality Date  . APPLICATION OF ROBOTIC ASSISTANCE FOR SPINAL PROCEDURE N/A 03/12/2019   Procedure: APPLICATION OF ROBOTIC ASSISTANCE FOR SPINAL PROCEDURE;  Surgeon: Judith Part, MD;  Location: [place];  Service: Neurosurgery;  Laterality: N/A;  . CESAREAN SECTION     X2   . COLONOSCOPY    . ESOPHAGOGASTRODUODENOSCOPY     . FLEXIBLE SIGMOIDOSCOPY    . I & D KNEE WITH POLY EXCHANGE  03/02/2012   Procedure: IRRIGATION AND DEBRIDEMENT KNEE WITH POLY EXCHANGE;  Surgeon: Alta Corning, MD;  Location: [place];  Service: Orthopedics;  Laterality: Left;  I&D of total knee with Possible Poly Exchange   . JOINT REPLACEMENT  Feb 2013   left knee- multiple surgeries   . KNEE ARTHROSCOPY     LT KNEE 04/2011  . KNEE ARTHROSCOPY  84   rt  . MASTECTOMY    . MASTECTOMY W/ SENTINEL NODE BIOPSY  12/19/2011   Procedure: MASTECTOMY WITH SENTINEL LYMPH NODE BIOPSY;  Surgeon: Haywood Lasso, MD;  Location: [facility];  Service: General;  Laterality: Left;  left breast and left axilla   . MOHS SURGERY     right face  . NO PAST SURGERIES     BLADDER SLING  4-5 YRS AGO   . PORT-A-CATH REMOVAL  03/06/2012   Procedure: REMOVAL PORT-A-CATH;  Surgeon: Odis Hollingshead, MD;  Location: [place];  Service: General;  Laterality: N/A;  . PORT-A-CATH REMOVAL Right 08/22/2012   Procedure: REMOVAL PORT-A-CATH;  Surgeon: Edward Jolly, MD;  Location: [facility];  Service: General;  Laterality: Right;  . PORTACATH PLACEMENT  01/16/2012   Procedure: INSERTION PORT-A-CATH;  Surgeon: Haywood Lasso, MD;  Location: [place];  Service: General;  Laterality: Right;  [place] Cath Placement   . PORTACATH PLACEMENT  05/01/2012   Procedure: INSERTION PORT-A-CATH;  Surgeon: Haywood Lasso, MD;  Location: [place];  Service: General;  Laterality: Right;  right internal jugular port-a-cath insertion  . TEE WITHOUT CARDIOVERSION  03/06/2012   Procedure: TRANSESOPHAGEAL ECHOCARDIOGRAM (TEE);  Surgeon: Josue Hector, MD;  Location: [place];  Service: Cardiovascular;  Laterality: N/A;  Rm. 2927  . TONSILLECTOMY    . TOTAL KNEE ARTHROPLASTY  08/15/2011   Procedure: TOTAL KNEE ARTHROPLASTY; lft Surgeon: Alta Corning, MD;  Location: [place];  Service: Orthopedics;  Laterality: Left;  RIGHT KNEE CORTIZONE INJECTION  . TOTAL KNEE  ARTHROPLASTY Left 08/18/2012   Procedure:  Irrigation and debridement of LEFT total knee;  Removal of total knee parts; Implant of spacers;  Surgeon: Kerin Salen, MD;  Location: [place];  Service: Orthopedics;  Laterality: Left;  . TOTAL KNEE ARTHROPLASTY Right 08/12/2013  . TOTAL KNEE ARTHROPLASTY Right 08/12/2013   Procedure: RIGHT TOTAL KNEE ARTHROPLASTY;  Surgeon: Kerin Salen, MD;  Location: [place];  Service: Orthopedics;  Laterality: Right;  . TOTAL KNEE REVISION Left 04/19/2013   Procedure: TOTAL KNEE REVISION AND REMOVAL CEMENT SPACER;  Surgeon: Kerin Salen, MD;  Location: [place];  Service: Orthopedics;  Laterality: Left;  . TRANSFORAMINAL LUMBAR INTERBODY FUSION (TLIF) WITH PEDICLE SCREW FIXATION 1 LEVEL Bilateral 03/12/2019   Procedure: Lumbar four-five Open transforaminal lumbar interbody fusion with Lumbar four-five laminectomy, bilateral Lumbar four-five Pedicle screw placement;  Surgeon: Judith Part, MD;  Location: [facility];  Service: Neurosurgery;  Laterality: Bilateral;  . TUMOR REMOVAL     FROM PELVIS AGE 34    There were no vitals filed for this visit.  Subjective Assessment - 07/04/19 1146    Subjective  Doing good, tape helps go up and down stairs.    Currently in Pain?  Yes    Pain Score  2     Pain Location  Buttocks    Pain Orientation  Right                       [facility] Adult PT Treatment/Exercise - 07/04/19 0001      Lumbar Exercises: Aerobic   Recumbent Bike  x3' with full revolutions     Nustep  L4 x 4 min       Knee/Hip Exercises: Standing   Other Standing Knee Exercises  Standing hip Ext and abd x5 each with RLE, X10 with LLE       Knee/Hip Exercises: Seated   Long Arc Quad  Left;3 sets;5 reps;Weights    Long Arc Quad Weight  3 lbs.    Ball Squeeze  2x10 3 sec hold     Other Seated Knee/Hip Exercises  Seated march 3lb 2x10     Abduction/Adduction   Both;2 sets;10 reps    Abd/Adduction Limitations  manual resistance       Moist Heat  Therapy   Number Minutes Moist Heat  10 Minutes    Moist Heat Location  Other (comment)   R Buttocks [place] tie in     4 inch step ups both x 3 each         PT Short Term Goals - 07/03/19 1201  PT SHORT TERM GOAL #1   Title  indepednent iwth initial HEP    Status  Achieved        PT Long Term Goals - 07/03/19 1201      PT LONG TERM GOAL #1   Title  understand posture and body mechanics    Baseline  pt reports a good understanding and feels independent with this    Status  Achieved      PT LONG TERM GOAL #2   Title  increase lumbar ROM 25%    Status  Achieved      PT LONG TERM GOAL #3   Title  tolerate standing 15 minutes to cook a meal    Baseline  5 min    Time  8    Period  Weeks    Status  On-going    Target Date  08/28/19      PT LONG TERM GOAL #4   Title  walk 600 feet without rest    Status  Achieved      PT LONG TERM GOAL #5   Title  decrease pain 25%    Baseline  50% improved    Status  Achieved      Additional Long Term Goals   Additional Long Term Goals  Yes      PT LONG TERM GOAL #6   Title  decrease left knee pain 50%    Baseline  pt only seen a couple visits with focus on knee, tape is helping her    Time  8    Period  Weeks    Status  On-going    Target Date  08/28/19      PT LONG TERM GOAL #7   Title  I with tape application to Lt knee    Time  8    Period  Weeks    Status  New    Target Date  08/28/19      PT LONG TERM GOAL #8   Title  walk =/> 1 mile with pain less than 2/10    Time  8    Period  Weeks    Status  New    Target Date  08/28/19            Plan - 07/04/19 1231    Clinical Impression Statement  Progressed to some LE strengthening interventions. Sme initial LE knee pain with LAQ that eased up as reps progressed. No issues with HS curls.Attempted standing hip exercises but had to discontinue on the R side due to pain. She was ale to tolerated hip abd/add in the seated position. Pain reported with  step ups although she stated the pain was not as bad as she thought it would be.    Comorbidities  MS, mastectomy, bilateral TKA's, lumbar surgery, reports complications in the left TKA requiring revisions    PT Duration  8 weeks    PT Treatment/Interventions  ADLs/Self Care Home Management;Cryotherapy;Electrical Stimulation;Moist Heat;Stair training;Functional mobility training;Neuromuscular re-education;Gait training;Balance training;Therapeutic exercise;Therapeutic activities;Patient/family education;Manual techniques    PT Next Visit Plan  have pt apply k tape and provide feedback (was on from teh previous day), lower body strengthening, perform reassessment       Patient will benefit from skilled therapeutic intervention in order to improve the following deficits and impairments:  Abnormal gait, Pain, Improper body mechanics, Postural dysfunction, Increased muscle spasms, Decreased mobility, Decreased activity tolerance, Decreased endurance, Decreased range of motion, Decreased strength, Difficulty walking, Impaired flexibility  Visit Diagnosis: Acute pain of left knee  Muscle spasm of back  Difficulty in walking, not elsewhere classified  Acute bilateral low back pain without sciatica     Problem List Patient Active Problem List   Diagnosis Date Noted  . Lumbar radiculopathy 03/12/2019  . Spondylolisthesis at L4-L5 level 08/24/2018  . Facet syndrome, lumbar 08/24/2018  . Chronic SI joint pain 03/30/2018  . Delayed sleep phase syndrome 03/30/2018  . History of optic neuritis 07/19/2016  . Hyperlipidemia 06/18/2015  . H/O malignant neoplasm of breast 02/18/2015  . Gonalgia 02/18/2015  . Relapsing remitting multiple sclerosis ([place]) 02/18/2015  . Well adult exam 02/02/2015  . Abdominal pain, generalized 02/02/2015  . Physical exam 12/17/2014  . High risk medication use 10/15/2014  . Anxiety state 10/15/2014  . Other fatigue 10/15/2014  . Obstructive sleep apnea 10/15/2014   . Restless leg syndrome 10/15/2014  . Allergic rhinitis 09/30/2014  . Pain in joint, shoulder region 10/14/2013  . Memory loss 10/14/2013  . Constipation 08/30/2013  . Depression 08/30/2013  . S/P total knee arthroplasty 08/23/2013  . Pain of right leg 08/23/2013  . Constipation due to pain medication 07/19/2013  . Hearing loss secondary to cerumen impaction 07/19/2013  . 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 Hypothyroidism 05/29/2013  . Esophageal reflux 05/29/2013  . Acquired absence of knee joint following removal of joint prosthesis with presence of antibiotic-impregnated cement spacer Left 04/19/2013  . Hx of radiation therapy   . Essential hypertension, benign 06/11/2012  . Multiple sclerosis ([place]) 01/23/2012  . Breast cancer of upper-outer quadrant of right female breast ([place]) 11/22/2011  . Osteoarthritis of both knees 08/15/2011    Scot Jun, PTA 07/04/2019, 12:39 PM  [place], [place], [address] Phone: [phone]   Fax:  [phone]  Name: Julie Ross MRN: [id number] Date of Birth: 23-Aug-1960

## 2019-07-05 ENCOUNTER — Ambulatory Visit: Payer: Commercial Managed Care - PPO | Admitting: Family Medicine

## 2019-07-05 ENCOUNTER — Ambulatory Visit: Payer: Commercial Managed Care - PPO | Admitting: Hematology and Oncology

## 2019-07-05 ENCOUNTER — Encounter: Payer: Self-pay | Admitting: Family Medicine

## 2019-07-05 VITALS — BP 124/62 | HR 67 | Temp 97.1°F | Ht 66.0 in | Wt 230.6 lb

## 2019-07-05 DIAGNOSIS — F32A Depression, unspecified: Secondary | ICD-10-CM

## 2019-07-05 DIAGNOSIS — E039 Hypothyroidism, unspecified: Secondary | ICD-10-CM | POA: Diagnosis not present

## 2019-07-05 DIAGNOSIS — F329 Major depressive disorder, single episode, unspecified: Secondary | ICD-10-CM

## 2019-07-05 DIAGNOSIS — I10 Essential (primary) hypertension: Secondary | ICD-10-CM

## 2019-07-05 DIAGNOSIS — Z7189 Other specified counseling: Secondary | ICD-10-CM | POA: Diagnosis not present

## 2019-07-05 DIAGNOSIS — Z7185 Encounter for immunization safety counseling: Secondary | ICD-10-CM | POA: Insufficient documentation

## 2019-07-05 DIAGNOSIS — G35 Multiple sclerosis: Secondary | ICD-10-CM

## 2019-07-05 MED ORDER — BUPROPION HCL ER (XL) 150 MG PO TB24: 150.0000 mg | ORAL_TABLET | Freq: Every day | ORAL | 1 refills | Status: DC

## 2019-07-05 MED ORDER — AMLODIPINE BESYLATE 10 MG PO TABS: 10.0000 mg | ORAL_TABLET | Freq: Every day | ORAL | 1 refills | Status: DC

## 2019-07-05 MED ORDER — LEVOTHYROXINE SODIUM 88 MCG PO TABS: 88.0000 ug | ORAL_TABLET | Freq: Every day | ORAL | 1 refills | Status: DC

## 2019-07-05 NOTE — Assessment & Plan Note (Signed)
Recent TSH reviewed.  Continue at current dose.

## 2019-07-05 NOTE — Assessment & Plan Note (Signed)
BP is well controlled with current medications, continue at current dose.   Recommend low salt diet and exercise.  F/u 6 months.

## 2019-07-05 NOTE — Progress Notes (Signed)
Julie Ross - 59 y.o. female MRN [id number]  Date of birth: 04/06/1961  Subjective Chief Complaint  Patient presents with  . Medication Refill    Wellbutrin,amlodipine and synthroid     HPI Julie Ross is a 59 y.o. female with multiple medical problems including HTN, depression with anxiety, hypothyroidism, MS, and chronic back pain here today for follow up.  She had back surgery in 02/2019 and is continuing to work through recovery of this.  She has had some increased anxiety due to the pandemic.  She tries to avoid going out if at all possible.  She is requesting refill of her bupropion.  She also take alprazolam for anxiety as needed.   Her blood pressure has remained well controlled with current medications of amlodipine and losartan/hctz.  She denies side effects including symptoms of hypotension.  She follow a low salt diet.   She is also requesting renewal of her levothyroxine.  She reports she is doing well at current dose.  She denies palpitations, fatigue or weight change.   She has questions regarding COVID vaccine in regards to her history of bee venom allergy.  She has never has anaphylaxis to medications or vaccines.    ROS:  A comprehensive ROS was completed and negative except as noted per HPI   Lab Results  Component Value Date   TSH 0.624 03/15/2019    Allergies  Allergen Reactions  . Bee Venom Anaphylaxis    Past Medical History:  Diagnosis Date  . Anemia    h/o - ? bld. transfusion - more recent- ?2013  . Anxiety    uses xanax mainly for sleep  . Arthritis    knees   . Breast cancer ([facility]) 11/2011   lul/ER+PR+, x1 lymph node   . Cancer (HCC)    squamous cell skin cancer x7  . Depression   . GERD (gastroesophageal reflux disease)   . H/O bone density study 03/2011  . H/O colonoscopy   . H/O echocardiogram    last one 02/2013, due to chemotherapy  . Heart burn   . Hx of radiation therapy 04/17/12- 06/04/12   left chest wall, axilla, L  supraclavicular fossa 4500 cGy, left mastectomy scar/chest wall 5940 cGy  . Hypertension   . Hypothyroidism   . Multiple sclerosis ([place])   . Neuromuscular disorder ([place])    MS TX WITH CYMBALTA   . Personal history of chemotherapy   . Personal history of radiation therapy   . Sleep apnea    MILD SLEEP APNEA, NO (Needed) MACHINE  6 YRS AGO HPT [facility]   . Wears glasses     Past Surgical History:  Procedure Laterality Date  . APPLICATION OF ROBOTIC ASSISTANCE FOR SPINAL PROCEDURE N/A 03/12/2019   Procedure: APPLICATION OF ROBOTIC ASSISTANCE FOR SPINAL PROCEDURE;  Surgeon: Judith Part, MD;  Location: [place];  Service: Neurosurgery;  Laterality: N/A;  . CESAREAN SECTION     X2   . COLONOSCOPY    . ESOPHAGOGASTRODUODENOSCOPY    . FLEXIBLE SIGMOIDOSCOPY    . I & D KNEE WITH POLY EXCHANGE  03/02/2012   Procedure: IRRIGATION AND DEBRIDEMENT KNEE WITH POLY EXCHANGE;  Surgeon: Alta Corning, MD;  Location: [place];  Service: Orthopedics;  Laterality: Left;  I&D of total knee with Possible Poly Exchange   . JOINT REPLACEMENT  Feb 2013   left knee- multiple surgeries   . KNEE ARTHROSCOPY     LT KNEE 04/2011  . KNEE ARTHROSCOPY  84   rt  . MASTECTOMY    . MASTECTOMY W/ SENTINEL NODE BIOPSY  12/19/2011   Procedure: MASTECTOMY WITH SENTINEL LYMPH NODE BIOPSY;  Surgeon: Haywood Lasso, MD;  Location: [place];  Service: General;  Laterality: Left;  left breast and left axilla   . MOHS SURGERY     right face  . NO PAST SURGERIES     BLADDER SLING  4-5 YRS AGO   . PORT-A-CATH REMOVAL  03/06/2012   Procedure: REMOVAL PORT-A-CATH;  Surgeon: Odis Hollingshead, MD;  Location: [place];  Service: General;  Laterality: N/A;  . PORT-A-CATH REMOVAL Right 08/22/2012   Procedure: REMOVAL PORT-A-CATH;  Surgeon: Edward Jolly, MD;  Location: [place];  Service: General;  Laterality: Right;  . PORTACATH PLACEMENT  01/16/2012   Procedure: INSERTION PORT-A-CATH;  Surgeon: Haywood Lasso, MD;  Location:  [place];  Service: General;  Laterality: Right;  [place] Cath Placement   . PORTACATH PLACEMENT  05/01/2012   Procedure: INSERTION PORT-A-CATH;  Surgeon: Haywood Lasso, MD;  Location: [place];  Service: General;  Laterality: Right;  right internal jugular port-a-cath insertion  . TEE WITHOUT CARDIOVERSION  03/06/2012   Procedure: TRANSESOPHAGEAL ECHOCARDIOGRAM (TEE);  Surgeon: Josue Hector, MD;  Location: [place];  Service: Cardiovascular;  Laterality: N/A;  Rm. [id number]  . TONSILLECTOMY    . TOTAL KNEE ARTHROPLASTY  08/15/2011   Procedure: TOTAL KNEE ARTHROPLASTY; lft Surgeon: Alta Corning, MD;  Location: [place];  Service: Orthopedics;  Laterality: Left;  RIGHT KNEE CORTIZONE INJECTION  . TOTAL KNEE ARTHROPLASTY Left 08/18/2012   Procedure:  Irrigation and debridement of LEFT total knee;  Removal of total knee parts; Implant of spacers;  Surgeon: Kerin Salen, MD;  Location: [place];  Service: Orthopedics;  Laterality: Left;  . TOTAL KNEE ARTHROPLASTY Right 08/12/2013  . TOTAL KNEE ARTHROPLASTY Right 08/12/2013   Procedure: RIGHT TOTAL KNEE ARTHROPLASTY;  Surgeon: Kerin Salen, MD;  Location: [place];  Service: Orthopedics;  Laterality: Right;  . TOTAL KNEE REVISION Left 04/19/2013   Procedure: TOTAL KNEE REVISION AND REMOVAL CEMENT SPACER;  Surgeon: Kerin Salen, MD;  Location: [place];  Service: Orthopedics;  Laterality: Left;  . TRANSFORAMINAL LUMBAR INTERBODY FUSION (TLIF) WITH PEDICLE SCREW FIXATION 1 LEVEL Bilateral 03/12/2019   Procedure: Lumbar four-five Open transforaminal lumbar interbody fusion with Lumbar four-five laminectomy, bilateral Lumbar four-five Pedicle screw placement;  Surgeon: Judith Part, MD;  Location: [place];  Service: Neurosurgery;  Laterality: Bilateral;  . TUMOR REMOVAL     FROM [place] AGE 39    Social History   Socioeconomic History  . Marital status: Married    Spouse name: Not on file  . Number of children: 2  . Years  of education: Not on file  . Highest education level: Not on file  Occupational History    Employer: [employer]   Tobacco Use  . Smoking status: Never Smoker  . Smokeless tobacco: Never Used  Substance and Sexual Activity  . Alcohol use: No  . Drug use: No  . Sexual activity: Yes  Other Topics Concern  . Not on file  Social History Narrative  . Not on file   Social Determinants of Health   Financial Resource Strain:   . Difficulty of Paying Living Expenses: Not on file  Food Insecurity:   . Worried About Charity fundraiser in the Last Year: Not on file  . Ran Out  of Food in the Last Year: Not on file  Transportation Needs:   . Lack of Transportation (Medical): Not on file  . Lack of Transportation (Non-Medical): Not on file  Physical Activity:   . Days of Exercise per Week: Not on file  . Minutes of Exercise per Session: Not on file  Stress:   . Feeling of Stress : Not on file  Social Connections:   . Frequency of Communication with Friends and Family: Not on file  . Frequency of Social Gatherings with Friends and Family: Not on file  . Attends Religious Services: Not on file  . Active Member of Clubs or Organizations: Not on file  . Attends Archivist Meetings: Not on file  . Marital Status: Not on file    Family History  Problem Relation Age of Onset  . Breast cancer Paternal Aunt        dx in her 43s  . Heart disease Maternal Grandfather   . Lung cancer Maternal Uncle        smoker  . Breast cancer Paternal Grandmother        dx in her 3s  . Ovarian cancer Paternal Aunt        dx in her 57s  . Arthritis Mother   . Hypertension Mother   . Heart disease Mother        CHF  . Dementia Father   . Diabetes type II Father     Health Maintenance  Topic Date Due  . HIV Screening  08/29/1973  . MAMMOGRAM  02/28/2018  . PAP SMEAR-Modifier  03/01/2019  . COLONOSCOPY  02/24/2025  . TETANUS/TDAP  08/20/2028  . INFLUENZA VACCINE  Completed  .  Hepatitis C Screening  Completed    ----------------------------------------------------------------------------------------------------------------------------------------------------------------------------------------------------------------- Physical Exam BP 124/62   Pulse 67   Temp (!) 97.1 F (36.2 C)   Ht 5\' 6"  (1.676 m)   Wt 230 lb 9.6 oz (104.6 kg)   LMP 12/13/2011   SpO2 98%   BMI 37.22 kg/m   Physical Exam Constitutional:      Appearance: Normal appearance.  HENT:     Head: Normocephalic and atraumatic.     Mouth/Throat:     Mouth: Mucous membranes are moist.  Eyes:     General: No scleral icterus. Cardiovascular:     Rate and Rhythm: Normal rate and regular rhythm.  Pulmonary:     Effort: Pulmonary effort is normal.     Breath sounds: Normal breath sounds.  Musculoskeletal:     Cervical back: Neck supple.  Skin:    General: Skin is warm and dry.  Neurological:     General: No focal deficit present.     Mental Status: She is alert.  Psychiatric:        Mood and Affect: Mood normal.        Behavior: Behavior normal.     ------------------------------------------------------------------------------------------------------------------------------------------------------------------------------------------------------------------- Assessment and Plan  Hypothyroidism Recent TSH reviewed.  Continue at current dose.   Essential hypertension, benign BP is well controlled with current medications, continue at current dose.   Recommend low salt diet and exercise.  F/u 6 months.   Depression due to multiple sclerosis Some worsening anxiety due to pandemic, overall doing ok.  Continue bupropion with alprazolam as needed.   Vaccine counseling Discussed recommendations for coronavirus vaccine given age and co-morbidities.  No prior reaction to medications or vaccines.  I think the should but ok to receive this but should be monitored closely for  15-30 min after  receiving.     This visit occurred during the SARS-CoV-2 public health emergency.  Safety protocols were in place, including screening questions prior to the visit, additional usage of staff PPE, and extensive cleaning of exam room while observing appropriate contact time as indicated for disinfecting solutions.

## 2019-07-05 NOTE — Assessment & Plan Note (Addendum)
Discussed recommendations for coronavirus vaccine given age and co-morbidities.  No prior reaction to medications or vaccines.  I think the should but ok to receive this but should be monitored closely for 15-30 min after receiving.

## 2019-07-05 NOTE — Patient Instructions (Signed)
Great to see you! Follow up in 6 months

## 2019-07-05 NOTE — Assessment & Plan Note (Signed)
Some worsening anxiety due to pandemic, overall doing ok.  Continue bupropion with alprazolam as needed.

## 2019-07-08 NOTE — Progress Notes (Signed)
Patient Care Team: Luetta Nutting, DO as PCP - General (Family Medicine) Juanda Chance, NP as Nurse Practitioner (Obstetrics and Gynecology) Gatha Mayer, MD as Consulting Physician (Gastroenterology) Frederik Pear, MD as Consulting Physician (Orthopedic Surgery)  DIAGNOSIS:    ICD-10-CM   1. Malignant neoplasm of upper-outer quadrant of right breast in female, estrogen receptor positive ([name])  C50.411    Z17.0     SUMMARY OF ONCOLOGIC HISTORY: Oncology History  Breast cancer of upper-outer quadrant of right female breast (Julie Ross)  11/22/2011 Initial Diagnosis   Cancer of upper-outer quadrant of female breast   12/19/2011 Surgery   Left mastectomy with SLN biopsy 2 foci of invasive ductal carcinoma 0.8 and 0.6 cm low-grade ER PR positive HER-2 positive ratio 3.27 Ki-67 11% one lymph node positive out of 4 (T1, N1, M0 stage II)   02/16/2012 - 04/14/2013 Chemotherapy   Taxotere, carboplatin, Herceptin x2 cycles complicated by bacteremia and left knee sepsis requiring revision of the knee. Herceptin maintenance every 3 weeks. Herceptin maintenance completed 04/14/2013   04/17/2012 - 06/04/2012 Radiation Therapy   Radiation therapy to the breast   03/13/2013 -  Anti-estrogen oral therapy   Letrozole 2.5 mg once daily     CHIEF COMPLIANT: Follow-up of left breast cancer on letrozole therapy  INTERVAL HISTORY: Julie Ross is a 59 y.o. with above-mentioned history of left breast cancer treated with mastectomy, adjuvant chemotherapy, radiation, and who is currently on letrozole. She presents to the clinic today for annual follow-up.  Her main complaint is weight gain as well as low blood pressure readings.  She would like to stop one of her blood pressure medications today.  ALLERGIES:  is allergic to bee venom.  MEDICATIONS:  Current Outpatient Medications  Medication Sig Dispense Refill  . ALPRAZolam (XANAX) 0.5 MG tablet TAKE 1 TABLET(0.5 MG) BY MOUTH THREE TIMES DAILY AS NEEDED  FOR ANXIETY 270 tablet 1  . amLODipine (NORVASC) 10 MG tablet Take 1 tablet (10 mg total) by mouth daily. 90 tablet 1  . atorvastatin (LIPITOR) 20 MG tablet TAKE 1 TABLET DAILY (Patient taking differently: Take 20 mg by mouth daily. ) 90 tablet 3  . baclofen (LIORESAL) 10 MG tablet TAKE 1 TABLET THREE TIMES A DAY AS NEEDED FOR MUSCLE SPASMS (Patient taking differently: Take 10 mg by mouth 3 (three) times daily as needed for muscle spasms. ) 270 tablet 3  . buPROPion (WELLBUTRIN XL) 150 MG 24 hr tablet Take 1 tablet (150 mg total) by mouth daily. 90 tablet 1  . cetirizine (ZYRTEC) 10 MG tablet TAKE 1 TABLET(10 MG) BY MOUTH DAILY 90 tablet 3  . EPINEPHrine 0.3 mg/0.3 mL IJ SOAJ injection Inject 0.3 mg into the muscle as needed for anaphylaxis.     Marland Kitchen gabapentin (NEURONTIN) 300 MG capsule Take 1-2 pills twice a day 360 capsule 3  . HYDROcodone-acetaminophen (NORCO/VICODIN) 5-325 MG tablet Take 1 tablet by mouth 2 (two) times daily as needed for pain.    Marland Kitchen KLOR-CON M20 20 MEQ tablet TAKE 1 TABLET DAILY (Patient taking differently: Take 20 mEq by mouth daily. ) 90 tablet 3  . letrozole (FEMARA) 2.5 MG tablet Take 1 tablet (2.5 mg total) by mouth daily. 90 tablet 3  . levothyroxine (SYNTHROID) 88 MCG tablet Take 1 tablet (88 mcg total) by mouth daily before breakfast. 90 tablet 1  . losartan-hydrochlorothiazide (HYZAAR) 100-25 MG tablet TAKE 1 TABLET EVERY MORNING BEFORE BREAKFAST (Patient taking differently: Take 1 tablet by mouth daily. ) 90  tablet 3  . omeprazole (PRILOSEC) 40 MG capsule TAKE 1 CAPSULE DAILY (Patient taking differently: Take 40 mg by mouth daily. ) 90 capsule 3  . Probiotic Product (PROBIOTIC DAILY PO) Take 1 capsule by mouth daily.     Marland Kitchen tiZANidine (ZANAFLEX) 2 MG tablet Take 2 mg by mouth 2 (two) times daily.    . vitamin E 400 UNIT capsule Take 800 Units by mouth daily.     No current facility-administered medications for this visit.    PHYSICAL EXAMINATION: ECOG PERFORMANCE  STATUS: 1 - Symptomatic but completely ambulatory  Vitals:   07/09/19 1513  BP: (!) 119/41  Pulse: 91  Resp: 17  Temp: 98.5 F (36.9 C)  SpO2: 99%   Filed Weights   07/09/19 1513  Weight: 233 lb 9.6 oz (106 kg)     LABORATORY DATA:  I have reviewed the data as listed CMP Latest Ref Rng & Units 03/17/2019 03/16/2019 03/14/2019  Glucose 70 - 99 mg/dL 116(H) 159(H) 178(H)  BUN 6 - 20 mg/dL 14 14 25(H)  Creatinine 0.44 - 1.00 mg/dL 1.04(H) 1.04(H) 1.37(H)  Sodium 135 - 145 mmol/L 137 137 133(L)  Potassium 3.5 - 5.1 mmol/L 3.7 3.1(L) 3.9  Chloride 98 - 111 mmol/L 101 104 97(L)  CO2 22 - 32 mmol/L 26 24 21(L)  Calcium 8.9 - 10.3 mg/dL 8.8(L) 8.2(L) 8.6(L)  Total Protein 6.0 - 8.5 g/dL - - -  Total Bilirubin 0.0 - 1.2 mg/dL - - -  Alkaline Phos 39 - 117 IU/L - - -  AST 0 - 40 IU/L - - -  ALT 0 - 32 IU/L - - -    Lab Results  Component Value Date   WBC 9.4 03/17/2019   HGB 9.0 (L) 03/17/2019   HCT 28.3 (L) 03/17/2019   MCV 74.9 (L) 03/17/2019   PLT 281 03/17/2019   NEUTROABS 5.5 03/17/2019    ASSESSMENT & PLAN:  Breast cancer of upper-outer quadrant of right female breast ([facility]) Left breast invasive ductal carcinoma with DCIS ER/PR positive HER-2 positive: Status post mastectomy, adjuvant chemotherapy in 1 year of Herceptin. Currently on letrozole adjuvant hormonal therapy started 03/13/13 .   Letrozole toxicities: 1. No major hot flashes or myalgias. We discussed the role of extended adjuvant therapy. She is willing to take antiestrogen treatment for a total of 7 years.  Breast cancer surveillance: 1. Breast exam done 07/09/2019: Benign 2. Mammogram 07/05/2017: Benign breast density category B, patient [facility].  Patient had [facility] bone density at [facility].    Multiple sclerosis:Causing fatigue and body pains. Patient was offered a treatment for MS but she does not want to take it because one of the Adverse effects  was neutropenia.  She is not sure whether she wants to take that medicine.  Anxiety/depression: On Wellbutrin  Return to clinic in1 yearfor follow-up.    No orders of the defined types were placed in this encounter.  The patient has a good understanding of the overall plan. she agrees with it. she will call with any problems that may develop before the next visit here.  Total time spent: 15 mins including face to face time and time spent for planning, charting and coordination of care  Nicholas Lose, MD 07/09/2019  I, Cloyde Reams Dorshimer, am acting as scribe for Dr. Nicholas Lose.  I have reviewed the above documentation for accuracy and completeness, and I agree with the above.

## 2019-07-09 ENCOUNTER — Other Ambulatory Visit: Payer: Self-pay

## 2019-07-09 ENCOUNTER — Inpatient Hospital Stay: Payer: Commercial Managed Care - PPO | Attending: Hematology and Oncology | Admitting: Hematology and Oncology

## 2019-07-09 DIAGNOSIS — F419 Anxiety disorder, unspecified: Secondary | ICD-10-CM | POA: Insufficient documentation

## 2019-07-09 DIAGNOSIS — Z9012 Acquired absence of left breast and nipple: Secondary | ICD-10-CM | POA: Diagnosis not present

## 2019-07-09 DIAGNOSIS — G35 Multiple sclerosis: Secondary | ICD-10-CM | POA: Diagnosis not present

## 2019-07-09 DIAGNOSIS — F329 Major depressive disorder, single episode, unspecified: Secondary | ICD-10-CM | POA: Insufficient documentation

## 2019-07-09 DIAGNOSIS — Z79811 Long term (current) use of aromatase inhibitors: Secondary | ICD-10-CM | POA: Diagnosis not present

## 2019-07-09 DIAGNOSIS — Z79899 Other long term (current) drug therapy: Secondary | ICD-10-CM | POA: Diagnosis not present

## 2019-07-09 DIAGNOSIS — Z170 Estrogen receptor positive status [ER+]: Secondary | ICD-10-CM

## 2019-07-09 DIAGNOSIS — C50411 Malignant neoplasm of upper-outer quadrant of right female breast: Secondary | ICD-10-CM | POA: Diagnosis present

## 2019-07-09 MED ORDER — LETROZOLE 2.5 MG PO TABS: 2.5000 mg | ORAL_TABLET | Freq: Every day | ORAL | 2 refills | Status: DC

## 2019-07-09 NOTE — Assessment & Plan Note (Signed)
Left breast invasive ductal carcinoma with DCIS ER/PR positive HER-2 positive: Status post mastectomy, adjuvant chemotherapy in 1 year of Herceptin. Currently on letrozole adjuvant hormonal therapy started 03/13/13 .   Letrozole toxicities: 1. No major hot flashes or myalgias. We discussed the role of extended adjuvant therapy. She is willing to take antiestrogen treatment for a total of 7 years.  Breast cancer surveillance: 1. Breast exam done 07/09/2019: Benign 2. Mammogram 07/05/2017: Benign breast density category B, patient is [facility].  Patient had a bone density at [facility].    Multiple sclerosis:Causing fatigue and body pains. Patient was offered a treatment for MS but she does not want to take it because one of the Adverse effects was neutropenia.  She is not sure whether she wants to take that medicine.  Anxiety/depression: On Wellbutrin  Return to clinic in1 yearfor follow-up.

## 2019-07-10 ENCOUNTER — Telehealth: Payer: Self-pay | Admitting: Family Medicine

## 2019-07-10 ENCOUNTER — Telehealth: Payer: Self-pay | Admitting: Hematology and Oncology

## 2019-07-10 NOTE — Telephone Encounter (Signed)
I talk with patient regarding schedule  

## 2019-07-10 NOTE — Telephone Encounter (Signed)
Ok to re-establish 

## 2019-07-10 NOTE — Telephone Encounter (Signed)
Former patient of Dr. Birdie Riddle, switched to [employer] for convenience. Dr. Zigmund Daniel is leaving [facility] at this end of this month, patient was more comfortable with Dr. Virgil Benedict care and would like to transfer back. Pt [phone]

## 2019-07-10 NOTE — Telephone Encounter (Signed)
TOC appt has been scheduled

## 2019-07-13 ENCOUNTER — Other Ambulatory Visit: Payer: Self-pay | Admitting: Hematology and Oncology

## 2019-07-17 ENCOUNTER — Other Ambulatory Visit: Payer: Self-pay

## 2019-07-18 ENCOUNTER — Ambulatory Visit: Payer: Commercial Managed Care - PPO | Admitting: Family Medicine

## 2019-07-18 ENCOUNTER — Encounter: Payer: Self-pay | Admitting: Family Medicine

## 2019-07-18 VITALS — BP 126/82 | HR 61 | Temp 98.1°F | Resp 16 | Ht 66.0 in | Wt 231.0 lb

## 2019-07-18 DIAGNOSIS — I10 Essential (primary) hypertension: Secondary | ICD-10-CM

## 2019-07-18 DIAGNOSIS — E785 Hyperlipidemia, unspecified: Secondary | ICD-10-CM | POA: Diagnosis not present

## 2019-07-18 LAB — CBC WITH DIFFERENTIAL/PLATELET
Basophils Absolute: 0.1 10*3/uL (ref 0.0–0.1)
Basophils Relative: 1.4 % (ref 0.0–3.0)
Eosinophils Absolute: 0.2 10*3/uL (ref 0.0–0.7)
Eosinophils Relative: 3 % (ref 0.0–5.0)
HCT: 36.8 % (ref 36.0–46.0)
Hemoglobin: 11.9 g/dL — ABNORMAL LOW (ref 12.0–15.0)
Lymphocytes Relative: 31 % (ref 12.0–46.0)
Lymphs Abs: 1.9 10*3/uL (ref 0.7–4.0)
MCHC: 32.3 g/dL (ref 30.0–36.0)
MCV: 75.3 fl — ABNORMAL LOW (ref 78.0–100.0)
Monocytes Absolute: 0.4 10*3/uL (ref 0.1–1.0)
Monocytes Relative: 6.6 % (ref 3.0–12.0)
Neutro Abs: 3.5 10*3/uL (ref 1.4–7.7)
Neutrophils Relative %: 58 % (ref 43.0–77.0)
Platelets: 244 10*3/uL (ref 150.0–400.0)
RBC: 4.89 Mil/uL (ref 3.87–5.11)
RDW: 15.7 % — ABNORMAL HIGH (ref 11.5–15.5)
WBC: 6.1 10*3/uL (ref 4.0–10.5)

## 2019-07-18 LAB — HEPATIC FUNCTION PANEL
ALT: 15 U/L (ref 0–35)
AST: 18 U/L (ref 0–37)
Albumin: 4.3 g/dL (ref 3.5–5.2)
Alkaline Phosphatase: 198 U/L — ABNORMAL HIGH (ref 39–117)
Bilirubin, Direct: 0.2 mg/dL (ref 0.0–0.3)
Total Bilirubin: 0.9 mg/dL (ref 0.2–1.2)
Total Protein: 7.1 g/dL (ref 6.0–8.3)

## 2019-07-18 LAB — LIPID PANEL
Cholesterol: 152 mg/dL (ref 0–200)
HDL: 62.6 mg/dL (ref >39.00)
LDL Cholesterol: 61 mg/dL (ref 0–99)
NonHDL: 88.94
Total CHOL/HDL Ratio: 2
Triglycerides: 139 mg/dL (ref 0.0–149.0)
VLDL: 27.8 mg/dL (ref 0.0–40.0)

## 2019-07-18 LAB — BASIC METABOLIC PANEL
BUN: 16 mg/dL (ref 6–23)
CO2: 27 mEq/L (ref 19–32)
Calcium: 9.6 mg/dL (ref 8.4–10.5)
Chloride: 101 mEq/L (ref 96–112)
Creatinine, Ser: 1.1 mg/dL (ref 0.40–1.20)
GFR: 50.52 mL/min — ABNORMAL LOW (ref >60.00)
Glucose, Bld: 110 mg/dL — ABNORMAL HIGH (ref 70–99)
Potassium: 3.9 mEq/L (ref 3.5–5.1)
Sodium: 139 mEq/L (ref 135–145)

## 2019-07-18 LAB — TSH: TSH: 2 u[IU]/mL (ref 0.35–4.50)

## 2019-07-18 NOTE — Assessment & Plan Note (Signed)
Ongoing issue for pt.  BMI of 37.28 plus HTN and hyperlipidemia qualifies as morbidly obese.  Stressed need for healthy diet and regular exercise.  Check labs to risk stratify.

## 2019-07-18 NOTE — Assessment & Plan Note (Signed)
Chronic problem.  Tolerating statin w/o difficulty.  Check labs.  Adjust meds prn  

## 2019-07-18 NOTE — Progress Notes (Signed)
   Subjective:    Patient ID: Julie Ross, female    DOB: [birth year], 59 y.o.   MRN: [id number]  HPI HTN- chronic problem, on Losartan HCTZ 100/25mg  daily w/ good control.  Stopped the Amlodipine b/c BP was running low.  Denies CP, SOB, HAs, visual changes, edema.  Hyperlipidemia- chronic problem, on Lipitor 20mg  daily.  No abd pain, N/V.  Obesity- ongoing issue for pt.  BMI is 37.28  Pt has started riding recumbent bike 20 minutes/day starting this week.  Review of Systems For ROS see HPI   This visit occurred during the SARS-CoV-2 public health emergency.  Safety protocols were in place, including screening questions prior to the visit, additional usage of staff PPE, and extensive cleaning of exam room while observing appropriate contact time as indicated for disinfecting solutions.       Objective:   Physical Exam Vitals reviewed.  Constitutional:      General: She is not in acute distress.    Appearance: She is well-developed. She is obese.  HENT:     Head: Normocephalic and atraumatic.  Eyes:     Conjunctiva/sclera: Conjunctivae normal.     Pupils: Pupils are equal, round, and reactive to light.  Neck:     Thyroid: No thyromegaly.  Cardiovascular:     Rate and Rhythm: Normal rate and regular rhythm.     Heart sounds: Normal heart sounds. No murmur.  Pulmonary:     Effort: Pulmonary effort is normal. No respiratory distress.     Breath sounds: Normal breath sounds.  Abdominal:     General: There is no distension.     Palpations: Abdomen is soft.     Tenderness: There is no abdominal tenderness.  Musculoskeletal:     Cervical back: Normal range of motion and neck supple.  Lymphadenopathy:     Cervical: No cervical adenopathy.  Skin:    General: Skin is warm and dry.  Neurological:     Mental Status: She is alert and oriented to person, place, and time.  Psychiatric:        Behavior: Behavior normal.           Assessment & Plan:

## 2019-07-18 NOTE — Patient Instructions (Signed)
Schedule your complete physical in 6 months We'll notify you of your lab results and make any changes if needed Continue to work on healthy diet and regular exercise- you can do it! Call with any questions or concerns Stay Safe!  Stay Healthy! 

## 2019-07-18 NOTE — Assessment & Plan Note (Signed)
Chronic problem.  Well controlled today.  Asymptomatic.  Check labs.  No anticipated med changes. 

## 2019-07-19 ENCOUNTER — Other Ambulatory Visit (INDEPENDENT_AMBULATORY_CARE_PROVIDER_SITE_OTHER): Payer: Commercial Managed Care - PPO

## 2019-07-19 DIAGNOSIS — I10 Essential (primary) hypertension: Secondary | ICD-10-CM

## 2019-07-19 LAB — GAMMA GT: GGT: 31 U/L (ref 7–51)

## 2019-07-22 ENCOUNTER — Ambulatory Visit: Payer: Commercial Managed Care - PPO | Admitting: Physical Therapy

## 2019-07-24 ENCOUNTER — Ambulatory Visit: Payer: Commercial Managed Care - PPO | Attending: Neurological Surgery | Admitting: Physical Therapy

## 2019-07-29 ENCOUNTER — Ambulatory Visit: Payer: Commercial Managed Care - PPO | Admitting: Physical Therapy

## 2019-07-31 ENCOUNTER — Encounter: Payer: Commercial Managed Care - PPO | Admitting: Physical Therapy

## 2019-07-31 ENCOUNTER — Telehealth: Payer: Self-pay | Admitting: *Deleted

## 2019-07-31 NOTE — Telephone Encounter (Signed)
Took call from phone staff. Spoke with [facility] with [facility]. They wanted to make sure MD aware pt getting tizanidine as well as baclofen filled. I spoke with Dr. Felecia Shelling and he states it is a safe combo and ok to refill her baclofen. She transferred me to pharmacist, Gerlean Ren. I relayed this to him. He verbalized understanding. Nothing further needed.

## 2019-08-01 ENCOUNTER — Ambulatory Visit: Payer: Commercial Managed Care - PPO

## 2019-08-02 ENCOUNTER — Encounter: Payer: Self-pay | Admitting: Family Medicine

## 2019-08-02 MED ORDER — BUPROPION HCL ER (XL) 150 MG PO TB24: 150.0000 mg | ORAL_TABLET | Freq: Every day | ORAL | 1 refills | Status: DC

## 2019-08-22 ENCOUNTER — Ambulatory Visit: Payer: Commercial Managed Care - PPO | Admitting: Neurology

## 2019-08-22 ENCOUNTER — Encounter: Payer: Self-pay | Admitting: Neurology

## 2019-08-22 ENCOUNTER — Other Ambulatory Visit: Payer: Self-pay

## 2019-08-22 VITALS — BP 130/74 | HR 84 | Temp 97.1°F | Ht 66.0 in | Wt 231.5 lb

## 2019-08-22 DIAGNOSIS — R5383 Other fatigue: Secondary | ICD-10-CM | POA: Diagnosis not present

## 2019-08-22 DIAGNOSIS — G35 Multiple sclerosis: Secondary | ICD-10-CM

## 2019-08-22 DIAGNOSIS — Z7189 Other specified counseling: Secondary | ICD-10-CM | POA: Diagnosis not present

## 2019-08-22 DIAGNOSIS — G4700 Insomnia, unspecified: Secondary | ICD-10-CM | POA: Insufficient documentation

## 2019-08-22 DIAGNOSIS — Z853 Personal history of malignant neoplasm of breast: Secondary | ICD-10-CM

## 2019-08-22 DIAGNOSIS — M4316 Spondylolisthesis, lumbar region: Secondary | ICD-10-CM | POA: Diagnosis not present

## 2019-08-22 DIAGNOSIS — Z7185 Encounter for immunization safety counseling: Secondary | ICD-10-CM

## 2019-08-22 MED ORDER — GABAPENTIN 600 MG PO TABS: 600.0000 mg | ORAL_TABLET | Freq: Three times a day (TID) | ORAL | 4 refills | Status: DC

## 2019-08-22 NOTE — Progress Notes (Signed)
[facility]  PATIENT: Julie Ross DOB: 04/11/1961  REFERRING DOCTOR OR PCP:  Annye Asa SOURCE: patient and records from [facility]  _________________________________   HISTORICAL  CHIEF COMPLAINT:  Chief Complaint  Patient presents with  . Follow-up    RM 12, alone. Last seen 02/20/2019  . Multiple Sclerosis    off DMT     HISTORY OF PRESENT ILLNESS:  Julie Ross is a 59 y.o. woman with multiple sclerosis and breast cancer.    Update 08/22/2019: She feels her MS is stable.  Last MRI was in 2019 and had shown one new lesion.   She is no longer on a DMT.   She is walking well.   She feels stiff when she stands up.    She feels this is more from the knees than her MS.   She denies any new numbness.   Bladder function is fine.   Muscle spasms are helped by xanax.   It also helps anxiety and sleep.     She notes fatigue .  She often goes back to sleep after she gets up in the morning and feeds the dogs, sleeping another couple hours.  She has trouble getting comfortable.  She has aching in her calves  She had neurosurgery at L4[facility]5.   She did need to get Narcan after she was unable to be aroused after opiates post-op.  She is doing better but does note buttock pain if sitting a long time.     She tries to exercise some with a recombent bike  Her breast cancer is doing well.   She sees Dr. Lindi Adie and has been told she is stable.     We discussed   Update 02/20/2019: She is going to have surgery for the L4L5 degeneration (synovial cyst and spondylolisthesis).   She will have pedicle screw placement (Dr. Zada Finders).  Her MS is stable.    She is not on a DMT.   Gait is poor more due to pain than neurologic issues.  She denies numbness buthas the radiating pain form her back.   She gets left calf spasms.    She has  No bladder issues.     Sleep is poor.   She has truble getting comfortable.  Rolling over in bed is painful.   She takes alprazolam,  gabapentin and baclofen at night.      Update 08/24/2018: She reports doing well with her breast cancer.  She also appears to be stable in regards to her multiple sclerosis with no exacerbations.  She remains off of a disease modifying therapy.  We had discussed Aubagio at the last visit as the MRI of the brain performed in late 2019 showed one new lesion not present on the 2017 MRI consistent with evidence of activity.  She is reporting buttock pain that radiates down her legs when she moves, especially rolling over in bed or stairs (especially downstairs where she is now crawling backwards).  Pain goes from the lower back to the buttock region and proximal legs but not beyond the knees.  Standing straight is worse and she bends forward when she walks.     Sitting is not painful.  Pain is midline in the back.   Initially pain was midline and she had facet blocks at [facility] that did not help much.   She then had SI joint injection 05/11/18 at [facility] (CT guided).  She does not think it has helped and in  fact is having more pain now than she did a couple months ago  MRi of the lumbar Raliegh Ip) report and images from 03/24/2017 were personally reviewed.  It showed severe L4-L5 facet hypertrophy associated with 1 to 2 mm anterolisthesis and some lateral recess stenosis but not significant foraminal narrowing.  She has mild to moderate facet hypertrophy at L3-L4 and L5-S1 and there is some foraminal narrowing to the left at L3-L4 encroaching on the L3 nerve root without compressing it.  I showed the relevant MRI changes to [facility] and her husband.  Update 03/30/2018 She has not been on any DMT since chemotherapy for breast cancer (treated with surgery, chemotherapy and RadRx).    She feels her MS has been stable.   She notes her gait is poor but her knees (has had bilateral TKR complicated by infections).    She does do PT and exercises twice weekly.    She has no recent falls.   Strength in legs is  normal and she has no numbness.   Bladder is fine.   Vision is stable (wears correction)  She notes short term memory is worse and she does not always remember what she read.    She has some verbal fluency issues and flips words around at times.   This frustrates her.    She has mild depression that is stable.   Late at night if in pain, she gets more depressed.   She stays up late until 2 am and gets up at 9 am and often takes a late morning nap.   She feels she could sleep all day.    She has moderately severe OSA and needs a FF mask (mouth breather).   She had difficulty with the standard mask.   She would like to try the Dreamwear FF mask.   She gets LBP that stays axial.    She denies sciatica.   Worst pain is in the left SI/piriformis region.  Pain is worse with standing.   She has L5S1 facet hypertrophy but facet joint injections did not help.   Ibuprofen has not helped much.     Update 08/21/2017:    She is reporting more pain in both. This bothers her mostly at night. It is not definitely associated with spasticity. Moving around does not make it much better. Occasions taken at night include Xanax, baclofen and gabapentin. At one time she also took hydrocodone sometimes at night, but not recently.     She has CPAP but has not been using it lately. The PSG in the past showed moderately severe OSA with AHI equals 33.    She also had restless legs and periodic limb movements.          She feels gait is stable. The balance is sometimes off but she has not had any recent falls. She does stumble some. Vision is doing the same. She has a history of optic neuritis on the left. Bladder function is fine.  Mood is fine.  Focus and attention mildly reduced.   From 01/17/2017: MS:   She is currently off a disease modifying therapy. Her MRI July 2017 was stable.The right TKR got infected and she required removal of hardware. She feels it is doing much better. She is still reluctant to go on a DMT due to risk of  infection.   Leg aching:   Her entire leg (not just knee) is aching at night and that wakes her up.  Shifting has not helped much.   Getting out of bed and walking does not help but a heating pad does.  This only bothers her at night.   It does not bother her if she sits a long time.     She takes baclofen , xanax and gabapentin 600 mg at bedtime and sometimes takes another one at 3-4 am.   The gabapentin seems to help but the second dose gives her a hangover until noon.    Ear sensation/lightheaded:   She gets outs of lightheadedness that often start with a stuffed up sensation in her right ear.   Mood/cognitive:    She notes depression and some anxiety.   She is apathetic.       She notes reduced focus and attention and sometime mild memory issues.   Her nose bled a lot with Flonase.   She already takes Zyrtec  Fatigue/sleep:   She has fatigue that is both physical and mental. The fatigue is present when she wakes up but worsens as the day goes on. Of note, she has severe OSA (AHI = 33) diagnosed 11/20/2013.   She was placed on Auto-PAP .  She recently tried the Dreamwear mask and feels it is easier to wear than other masks.    She also has moderate RLS with some impact on sleep, helped by nighttime gabapentin 300 mg and baclofen and xanax at bedtime.      During the day she is very sleepy but then has trouble falling asleep at night.      Provigil made her jittery.   Phentermine helped the sleepiness but she had mild stomach upset.  Gait/strength/sensation:   Gait issues are mild and mostly orthopedic in nature .   Her legs fatigue easily.   She occasioanally trips over her feet and she broke her 5th toe once.    She denies any leg numbness.  Vision:   She had left optic neuritis in 2012. Left visual acuity improved but not to baseline.   Colors are desaturated on the left, esp when tired  Bladder:   Bladder function is fine.    MS History:    She was diagnosed with MS in 1999 after presenting  with optic neuritis.   She had a spinal tap, MRI and angiogram.   Initially, the diagnosis was uncertain but she had changes in the MRI over time leading to a diagnosis of MS a few years later.    Initially, she was placed on Copaxone, then switched to Avonex due to skin reactions.   She then switched to Tysabri when she had some mild breakthrough disease. She tolerated Tysabri very well and her MS did well.  However, she was JCV antibody positive so she stopped in 2012.   She did not restart Avonex as she did not want to go back to an injection.    Her last exacerbation was optic neuritis in 2012 treated with steroids. She proved but did not get back to baseline.  This exacerbation occurred off any DMT.  She tried Gilenya in 2013. She had trouble tolerating Gilenya and discontinued shortly after starting it. She was going to start Tecfidera but was diagnosed with breast cancer in 2013 and opted not to start at that time. She received chemotherapy including Taxotere, carboplatin, Herceptin in 2013 and 2014. An MRI of the brain in 2015 showed only mild MS progression compared to her previous one in 2011.      REVIEW OF SYSTEMS:  Constitutional: No fevers, chills, sweats, or change in appetite.  Notes fatigue Eyes: No visual changes, double vision, eye pain Ear, nose and throat: No hearing loss, ear pain, nasal congestion, sore throat.  Recent URI Cardiovascular: No chest pain, palpitations Respiratory: No shortness of breath at rest or with exertion.   No wheezes.  Has OSA GastrointestinaI: No nausea, vomiting, diarrhea, abdominal pain, fecal incontinence Genitourinary: No dysuria, urinary retention or frequency.  She has 1 times nocturia most nights Musculoskeletal: No neck pain, back pain.  She has bilateral knee pain Integumentary: No rash, pruritus, skin lesions Neurological: as above Psychiatric: No depression at this time.  Some anxiety Endocrine: No palpitations, diaphoresis, change in  appetite, change in weigh or increased thirst Hematologic/Lymphatic: No anemia, purpura, petechiae. Allergic/Immunologic: No itchy/runny eyes, nasal congestion, recent allergic reactions, rashes  ALLERGIES: Allergies  Allergen Reactions  . Bee Venom Anaphylaxis    HOME MEDICATIONS:  Current Outpatient Medications:  .  ALPRAZolam (XANAX) 0.5 MG tablet, TAKE 1 TABLET(0.5 MG) BY MOUTH THREE TIMES DAILY AS NEEDED FOR ANXIETY, Disp: 270 tablet, Rfl: 1 .  APPLE CIDER VINEGAR PO, Take by mouth., Disp: , Rfl:  .  Ascorbic Acid (VITAMIN C PO), Take by mouth., Disp: , Rfl:  .  atorvastatin (LIPITOR) 20 MG tablet, TAKE 1 TABLET DAILY (Patient taking differently: Take 20 mg by mouth daily. ), Disp: 90 tablet, Rfl: 3 .  baclofen (LIORESAL) 10 MG tablet, TAKE 1 TABLET THREE TIMES A DAY AS NEEDED FOR MUSCLE SPASMS (Patient taking differently: Take 10 mg by mouth 3 (three) times daily as needed for muscle spasms. ), Disp: 270 tablet, Rfl: 3 .  buPROPion (WELLBUTRIN XL) 150 MG 24 hr tablet, Take 1 tablet (150 mg total) by mouth daily., Disp: 90 tablet, Rfl: 1 .  cetirizine (ZYRTEC) 10 MG tablet, TAKE 1 TABLET(10 MG) BY MOUTH DAILY, Disp: 90 tablet, Rfl: 3 .  Cholecalciferol (VITAMIN D3 PO), Take by mouth., Disp: , Rfl:  .  Cyanocobalamin (VITAMIN B-12 PO), Take by mouth., Disp: , Rfl:  .  EPINEPHrine 0.3 mg/0.3 mL IJ SOAJ injection, Inject 0.3 mg into the muscle as needed for anaphylaxis. , Disp: , Rfl:  .  HYDROcodone-acetaminophen (NORCO/VICODIN) 5-325 MG tablet, Take 1 tablet by mouth 2 (two) times daily as needed for pain., Disp: , Rfl:  .  KLOR-CON M20 20 MEQ tablet, TAKE 1 TABLET DAILY (Patient taking differently: Take 20 mEq by mouth daily. ), Disp: 90 tablet, Rfl: 3 .  letrozole (FEMARA) 2.5 MG tablet, Take 1 tablet (2.5 mg total) by mouth daily., Disp: 90 tablet, Rfl: 2 .  levothyroxine (SYNTHROID) 88 MCG tablet, Take 1 tablet (88 mcg total) by mouth daily before breakfast., Disp: 90 tablet, Rfl:  1 .  losartan-hydrochlorothiazide (HYZAAR) 100-25 MG tablet, TAKE 1 TABLET EVERY MORNING BEFORE BREAKFAST (Patient taking differently: Take 1 tablet by mouth daily. ), Disp: 90 tablet, Rfl: 3 .  omeprazole (PRILOSEC) 40 MG capsule, TAKE 1 CAPSULE DAILY (Patient taking differently: Take 40 mg by mouth daily. ), Disp: 90 capsule, Rfl: 3 .  vitamin E 400 UNIT capsule, Take 800 Units by mouth daily., Disp: , Rfl:  .  gabapentin (NEURONTIN) 600 MG tablet, Take 1 tablet (600 mg total) by mouth 3 (three) times daily., Disp: 270 tablet, Rfl: 4  PAST MEDICAL HISTORY: Past Medical History:  Diagnosis Date  . Anemia    h/o - ? bld. transfusion - more recent- ?2013  . Anxiety    uses xanax mainly  for sleep  . Arthritis    knees   . Breast cancer ([place]) 11/2011   lul/ER+PR+, x1 lymph node   . Cancer ([facility])    squamous cell skin cancer x7  . Depression   . GERD (gastroesophageal reflux disease)   . H/O bone density study 03/2011  . H/O colonoscopy   . H/O echocardiogram    last one 02/2013, due to chemotherapy  . Heart burn   . Hx of radiation therapy 04/17/12- 06/04/12   left chest wall, axilla, L supraclavicular fossa 4500 cGy, left mastectomy scar/chest wall 5940 cGy  . Hypertension   . Hypothyroidism   . Multiple sclerosis ([place])   . Neuromuscular disorder ([place])    MS TX WITH CYMBALTA   . Personal history of chemotherapy   . Personal history of radiation therapy   . Sleep apnea    MILD SLEEP APNEA, NO (Needed) MACHINE  6 YRS AGO HPT [facility]   . Wears glasses     PAST SURGICAL HISTORY: Past Surgical History:  Procedure Laterality Date  . APPLICATION OF ROBOTIC ASSISTANCE FOR SPINAL PROCEDURE N/A 03/12/2019   Procedure: APPLICATION OF ROBOTIC ASSISTANCE FOR SPINAL PROCEDURE;  Surgeon: Judith Part, MD;  Location: [place];  Service: Neurosurgery;  Laterality: N/A;  . CESAREAN SECTION     X2   . COLONOSCOPY    . ESOPHAGOGASTRODUODENOSCOPY    . FLEXIBLE SIGMOIDOSCOPY    . I & D  KNEE WITH POLY EXCHANGE  03/02/2012   Procedure: IRRIGATION AND DEBRIDEMENT KNEE WITH POLY EXCHANGE;  Surgeon: Alta Corning, MD;  Location: [place];  Service: Orthopedics;  Laterality: Left;  I&D of total knee with Possible Poly Exchange   . JOINT REPLACEMENT  Feb 2013   left knee- multiple surgeries   . KNEE ARTHROSCOPY     LT KNEE 04/2011  . KNEE ARTHROSCOPY  82   rt  . MASTECTOMY    . MASTECTOMY W/ SENTINEL NODE BIOPSY  12/19/2011   Procedure: MASTECTOMY WITH SENTINEL LYMPH NODE BIOPSY;  Surgeon: Haywood Lasso, MD;  Location: [place];  Service: General;  Laterality: Left;  left breast and left axilla   . MOHS SURGERY     right face  . NO PAST SURGERIES     BLADDER SLING  4-5 YRS AGO   . PORT-A-CATH REMOVAL  03/06/2012   Procedure: REMOVAL PORT-A-CATH;  Surgeon: Odis Hollingshead, MD;  Location: [place];  Service: General;  Laterality: N/A;  . PORT-A-CATH REMOVAL Right 08/22/2012   Procedure: REMOVAL PORT-A-CATH;  Surgeon: Edward Jolly, MD;  Location: [place];  Service: General;  Laterality: Right;  . PORTACATH PLACEMENT  01/16/2012   Procedure: INSERTION PORT-A-CATH;  Surgeon: Haywood Lasso, MD;  Location: [place];  Service: General;  Laterality: Right;  [place] Cath Placement   . PORTACATH PLACEMENT  05/01/2012   Procedure: INSERTION PORT-A-CATH;  Surgeon: Haywood Lasso, MD;  Location: [place];  Service: General;  Laterality: Right;  right internal jugular port-a-cath insertion  . TEE WITHOUT CARDIOVERSION  03/06/2012   Procedure: TRANSESOPHAGEAL ECHOCARDIOGRAM (TEE);  Surgeon: Josue Hector, MD;  Location: [place];  Service: Cardiovascular;  Laterality: N/A;  Rm. 2927  . TONSILLECTOMY    . TOTAL KNEE ARTHROPLASTY  08/15/2011   Procedure: TOTAL KNEE ARTHROPLASTY; lft Surgeon: Alta Corning, MD;  Location: [place];  Service: Orthopedics;  Laterality: Left;  RIGHT KNEE CORTIZONE INJECTION  . TOTAL KNEE ARTHROPLASTY Left 08/18/2012  Procedure:   Irrigation and debridement of LEFT total knee;  Removal of total knee parts; Implant of spacers;  Surgeon: Kerin Salen, MD;  Location: [place];  Service: Orthopedics;  Laterality: Left;  . TOTAL KNEE ARTHROPLASTY Right 08/12/2013  . TOTAL KNEE ARTHROPLASTY Right 08/12/2013   Procedure: RIGHT TOTAL KNEE ARTHROPLASTY;  Surgeon: Kerin Salen, MD;  Location: [place];  Service: Orthopedics;  Laterality: Right;  . TOTAL KNEE REVISION Left 04/19/2013   Procedure: TOTAL KNEE REVISION AND REMOVAL CEMENT SPACER;  Surgeon: Kerin Salen, MD;  Location: [place];  Service: Orthopedics;  Laterality: Left;  . TRANSFORAMINAL LUMBAR INTERBODY FUSION (TLIF) WITH PEDICLE SCREW FIXATION 1 LEVEL Bilateral 03/12/2019   Procedure: Lumbar four-five Open transforaminal lumbar interbody fusion with Lumbar four-five laminectomy, bilateral Lumbar four-five Pedicle screw placement;  Surgeon: Judith Part, MD;  Location: [place];  Service: Neurosurgery;  Laterality: Bilateral;  . TUMOR REMOVAL     FROM [place] AGE 62    FAMILY HISTORY: Family History  Problem Relation Age of Onset  . Breast cancer Paternal Aunt        dx in her 40s  . Heart disease Maternal Grandfather   . Lung cancer Maternal Uncle        smoker  . Breast cancer Paternal Grandmother        dx in her 46s  . Ovarian cancer Paternal Aunt        dx in her 66s  . Arthritis Mother   . Hypertension Mother   . Heart disease Mother        CHF  . Dementia Father   . Diabetes type II Father     SOCIAL HISTORY:  Social History   Socioeconomic History  . Marital status: Married    Spouse name: Not on file  . Number of children: 2  . Years of education: Not on file  . Highest education level: Not on file  Occupational History    Employer: [employer]   Tobacco Use  . Smoking status: Never Smoker  . Smokeless tobacco: Never Used  Substance and Sexual Activity  . Alcohol use: No  . Drug use: No  . Sexual activity: Yes  Other Topics Concern    . Not on file  Social History Narrative  . Not on file   Social Determinants of Health   Financial Resource Strain:   . Difficulty of Paying Living Expenses: Not on file  Food Insecurity:   . Worried About Charity fundraiser in the Last Year: Not on file  . Ran Out of Food in the Last Year: Not on file  Transportation Needs:   . Lack of Transportation (Medical): Not on file  . Lack of Transportation (Non-Medical): Not on file  Physical Activity:   . Days of Exercise per Week: Not on file  . Minutes of Exercise per Session: Not on file  Stress:   . Feeling of Stress : Not on file  Social Connections:   . Frequency of Communication with Friends and Family: Not on file  . Frequency of Social Gatherings with Friends and Family: Not on file  . Attends Religious Services: Not on file  . Active Member of Clubs or Organizations: Not on file  . Attends Archivist Meetings: Not on file  . Marital Status: Not on file  Intimate Partner Violence:   . Fear of Current or Ex-Partner: Not on file  . Emotionally Abused: Not on file  .  Physically Abused: Not on file  . Sexually Abused: Not on file     PHYSICAL EXAM  Vitals:   08/22/19 1500  BP: 130/74  Pulse: 84  Temp: (!) 97.1 F (36.2 C)  Weight: 231 lb 8 oz (105 kg)  Height: 5\' 6"  (1.676 m)    Body mass index is 37.37 kg/m.   General: The patient is well-developed and well-nourished and in no acute distress.   Reduced range of motion of the lower back.  She is tender in the lower lumbar paraspinal region and over the piriformis/SI region bilaterally   Neurologic Exam  Mental status: The patient is alert and oriented x 3 at the time of the examination. The patient has apparent normal recent and remote memory, with an apparently normal attention span and concentration ability.   Speech is normal.  Cranial nerves: Extraocular movements are full.  Facial strength and sensation is normal. The tongue is midline, and the  patient has symmetric elevation of the soft palate. No obvious hearing deficits are noted.  Motor:  Muscle bulk is normal.   Muscle tone is normal.  Strength appears to be normal in the legs except right EHL (4/5)  Sensory: Intact sensation to touch and vibration in legs.    Coordination: Finger-nose-finger is performed well...  Gait and station: Station is normal.   Gait is arthritic  The tandem gait is wide.  She leans forward some as she walks.. The Romberg is negative.  Reflexes: Deep tendon reflexes are symmetric and normal bilaterally in arms.  DTRs are absent at the ankles.  ____________________________________________________  ASSESSMENT AND PLAN  Multiple sclerosis ([place])  Spondylolisthesis at L4-L5 level  Vaccine counseling  Other fatigue  Insomnia, unspecified type  H/O malignant neoplasm of breast   1.   She will continue off of her disease modifying therapy.  Check MRI of the brain to make sure that there is no subclinical progression.  If present, we will restart a medication.   2.   Try to stay active 3.    Increase gabapentin 4.  she will return to see [place] in 6 months or sooner if she has new or worsening neurologic symptoms.    Vernelle Wisner A. Felecia Shelling, MD, PhD [id number], [id number] PM Certified in Neurology, [place] Neurophysiology, Sleep Medicine, Pain Medicine and Neuroimaging  [facility] [address] [place], [place] [address] [phone]

## 2019-08-23 ENCOUNTER — Other Ambulatory Visit: Payer: Self-pay | Admitting: General Practice

## 2019-08-23 MED ORDER — OMEPRAZOLE 40 MG PO CPDR: 40.0000 mg | DELAYED_RELEASE_CAPSULE | Freq: Every day | ORAL | 1 refills | Status: DC

## 2019-08-28 ENCOUNTER — Telehealth: Payer: Self-pay | Admitting: Family Medicine

## 2019-08-28 NOTE — Telephone Encounter (Signed)
Please advise 

## 2019-08-28 NOTE — Telephone Encounter (Signed)
Patient called in and stated that she was having Ear pain she stated that she tried to flush it but it still fills clogged. Patient would like to come in the office she is not having any other symptoms. Please advise.

## 2019-08-28 NOTE — Telephone Encounter (Signed)
[place] for in office appt if passes screening

## 2019-08-30 ENCOUNTER — Ambulatory Visit: Payer: Commercial Managed Care - PPO | Admitting: Family Medicine

## 2019-08-30 ENCOUNTER — Other Ambulatory Visit: Payer: Self-pay

## 2019-08-30 ENCOUNTER — Encounter: Payer: Self-pay | Admitting: Family Medicine

## 2019-08-30 VITALS — BP 136/84 | HR 90 | Temp 98.4°F | Resp 16 | Ht 66.0 in | Wt 233.1 lb

## 2019-08-30 DIAGNOSIS — H6981 Other specified disorders of Eustachian tube, right ear: Secondary | ICD-10-CM | POA: Diagnosis not present

## 2019-08-30 DIAGNOSIS — H6121 Impacted cerumen, right ear: Secondary | ICD-10-CM

## 2019-08-30 MED ORDER — FLUTICASONE PROPIONATE 50 MCG/ACT NA SUSP: 2.0000 | Freq: Every day | NASAL | 6 refills | Status: DC

## 2019-08-30 NOTE — Patient Instructions (Signed)
Follow up as needed or as scheduled START Flonase- 2 sprays each nostril daily CONTINUE the Zyrtec daily Drink plenty of fluids Call with any questions or concerns HAPPY BIRTHDAY!!!

## 2019-08-30 NOTE — Progress Notes (Signed)
   Subjective:    Patient ID: Julie Ross, female    DOB: 29-Dec-1960, 59 y.o.   MRN: [id number]  HPI Ear pain- R ear, sxs started last week.  'it felt like it was clogged up and then it started aching'.  Has had increased wax production the last few months.  No drainage.  No fevers.  + muffled hearing.   Review of Systems For ROS see HPI   This visit occurred during the SARS-CoV-2 public health emergency.  Safety protocols were in place, including screening questions prior to the visit, additional usage of staff PPE, and extensive cleaning of exam room while observing appropriate contact time as indicated for disinfecting solutions.     Objective:   Physical Exam Vitals reviewed.  Constitutional:      Appearance: Normal appearance.  HENT:     Head: Normocephalic and atraumatic.     Right Ear: There is impacted cerumen (wax removed w/ curette, TM dull and retracted but not red or w/ visible fluid level).     Left Ear: Tympanic membrane and ear canal normal.  Neurological:     General: No focal deficit present.     Mental Status: She is alert and oriented to person, place, and time.  Psychiatric:        Mood and Affect: Mood normal.        Behavior: Behavior normal.        Thought Content: Thought content normal.           Assessment & Plan:  Impacted Cerumen- new.  Wax removed w/ curette in office w/o difficulty.  Pt tolerated w/o difficulty.  Ear felt somewhat better  Eustachian tube dysfxn- new.  Reviewed dx w/ pt.  Continue Zyrtec.  Add Flonase.  Discussed using opposite hand/nostril approach to minimize nose bleeds.  Pt expressed understanding and is in agreement w/ plan.

## 2019-09-02 ENCOUNTER — Telehealth: Payer: Self-pay | Admitting: Neurology

## 2019-09-02 ENCOUNTER — Other Ambulatory Visit: Payer: Self-pay | Admitting: General Practice

## 2019-09-02 MED ORDER — EPINEPHRINE 0.3 MG/0.3ML IJ SOAJ: 0.3000 mg | INTRAMUSCULAR | 1 refills | Status: DC | PRN

## 2019-09-02 NOTE — Telephone Encounter (Signed)
[facility] Auth: NPR Ref # [id number] on 09/02/19 order sent to GI. They will reach out to the patient to schedule.

## 2019-09-03 ENCOUNTER — Encounter: Payer: Self-pay | Admitting: Family Medicine

## 2019-09-04 MED ORDER — ONDANSETRON HCL 4 MG PO TABS: 4.0000 mg | ORAL_TABLET | Freq: Three times a day (TID) | ORAL | 0 refills | Status: DC | PRN

## 2019-09-06 ENCOUNTER — Encounter: Payer: Self-pay | Admitting: Family Medicine

## 2019-09-09 MED ORDER — LEVOTHYROXINE SODIUM 88 MCG PO TABS: 88.0000 ug | ORAL_TABLET | Freq: Every day | ORAL | 1 refills | Status: DC

## 2019-10-09 ENCOUNTER — Encounter (HOSPITAL_COMMUNITY): Payer: Self-pay

## 2019-10-09 ENCOUNTER — Other Ambulatory Visit: Payer: Self-pay

## 2019-10-09 ENCOUNTER — Emergency Department (HOSPITAL_COMMUNITY)
Admission: EM | Admit: 2019-10-09 | Discharge: 2019-10-09 | Disposition: A | Discharge status: Home / Self Care | Payer: Commercial Managed Care - PPO | Attending: Emergency Medicine | Admitting: Emergency Medicine

## 2019-10-09 ENCOUNTER — Emergency Department (HOSPITAL_COMMUNITY): Payer: Commercial Managed Care - PPO

## 2019-10-09 DIAGNOSIS — Z5321 Procedure and treatment not carried out due to patient leaving prior to being seen by health care provider: Secondary | ICD-10-CM | POA: Diagnosis not present

## 2019-10-09 DIAGNOSIS — R0789 Other chest pain: Secondary | ICD-10-CM | POA: Insufficient documentation

## 2019-10-09 LAB — CBC
HCT: 38.1 % (ref 36.0–46.0)
Hemoglobin: 12.1 g/dL (ref 12.0–15.0)
MCH: 25.1 pg — ABNORMAL LOW (ref 26.0–34.0)
MCHC: 31.8 g/dL (ref 30.0–36.0)
MCV: 78.9 fL — ABNORMAL LOW (ref 80.0–100.0)
Platelets: 274 10*3/uL (ref 150–400)
RBC: 4.83 MIL/uL (ref 3.87–5.11)
RDW: 15.7 % — ABNORMAL HIGH (ref 11.5–15.5)
WBC: 7.4 10*3/uL (ref 4.0–10.5)
nRBC: 0 % (ref 0.0–0.2)

## 2019-10-09 LAB — BASIC METABOLIC PANEL
Anion gap: 11 (ref 5–15)
BUN: 13 mg/dL (ref 8–23)
CO2: 25 mmol/L (ref 22–32)
Calcium: 9.3 mg/dL (ref 8.9–10.3)
Chloride: 103 mmol/L (ref 98–111)
Creatinine, Ser: 1.19 mg/dL — ABNORMAL HIGH (ref 0.44–1.00)
GFR calc Af Amer: 57 mL/min — ABNORMAL LOW (ref >60)
GFR calc non Af Amer: 49 mL/min — ABNORMAL LOW (ref >60)
Glucose, Bld: 124 mg/dL — ABNORMAL HIGH (ref 70–99)
Potassium: 3.5 mmol/L (ref 3.5–5.1)
Sodium: 139 mmol/L (ref 135–145)

## 2019-10-09 LAB — TROPONIN I (HIGH SENSITIVITY): Troponin I (High Sensitivity): 4 ng/L (ref <18)

## 2019-10-09 NOTE — ED Notes (Signed)
Pt decide to leave

## 2019-10-09 NOTE — ED Triage Notes (Signed)
Pt reports centralized chest pain that started on Sunday, the pain now radiates down her right arm. Denies any other symptoms, pain worse with a deep breath. Pt a.o, resp e.u

## 2019-10-31 ENCOUNTER — Other Ambulatory Visit: Payer: Self-pay | Admitting: Neurology

## 2019-11-04 DIAGNOSIS — M1990 Unspecified osteoarthritis, unspecified site: Secondary | ICD-10-CM | POA: Insufficient documentation

## 2019-11-04 DIAGNOSIS — C50919 Malignant neoplasm of unspecified site of unspecified female breast: Secondary | ICD-10-CM | POA: Insufficient documentation

## 2019-11-07 LAB — BASIC METABOLIC PANEL
BUN: 23 — AB (ref 4–21)
CO2: 22 (ref 13–22)
Chloride: 99 (ref 99–108)
Creatinine: 1.2 — AB (ref 0.5–1.1)
Glucose: 98
Potassium: 4.1 (ref 3.4–5.3)
Sodium: 137 (ref 137–147)

## 2019-11-07 LAB — HEPATIC FUNCTION PANEL
ALT: 15 (ref 7–35)
AST: 19 (ref 13–35)
Alkaline Phosphatase: 196 — AB (ref 25–125)
Bilirubin, Total: 0.6

## 2019-11-07 LAB — COMPREHENSIVE METABOLIC PANEL
Albumin: 4.4 (ref 3.5–5.0)
Calcium: 9.5 (ref 8.7–10.7)
Globulin: 2.8

## 2019-11-07 LAB — LIPID PANEL
Cholesterol: 123 (ref 0–200)
HDL: 55 (ref 35–70)
LDL Cholesterol: 49
LDl/HDL Ratio: 0.9
Triglycerides: 102 (ref 40–160)

## 2019-11-07 LAB — HEMOGLOBIN A1C: Hemoglobin A1C: 6.4

## 2019-11-07 LAB — VITAMIN D 25 HYDROXY (VIT D DEFICIENCY, FRACTURES): Vit D, 25-Hydroxy: 55.4

## 2019-11-07 LAB — TSH: TSH: 4.82 (ref 0.41–5.90)

## 2019-11-17 ENCOUNTER — Other Ambulatory Visit: Payer: Self-pay | Admitting: Family Medicine

## 2019-11-22 ENCOUNTER — Encounter: Payer: Self-pay | Admitting: General Practice

## 2019-12-31 ENCOUNTER — Other Ambulatory Visit: Payer: Self-pay | Admitting: Neurology

## 2020-01-09 ENCOUNTER — Encounter: Payer: Self-pay | Admitting: Family Medicine

## 2020-01-11 ENCOUNTER — Other Ambulatory Visit: Payer: Self-pay | Admitting: Family Medicine

## 2020-01-14 DIAGNOSIS — Z0289 Encounter for other administrative examinations: Secondary | ICD-10-CM

## 2020-01-21 NOTE — Telephone Encounter (Signed)
Gave completed/signed disability form back to medical records to process for pt. 

## 2020-01-23 ENCOUNTER — Other Ambulatory Visit: Payer: Self-pay | Admitting: Family Medicine

## 2020-02-19 ENCOUNTER — Other Ambulatory Visit: Payer: Self-pay | Admitting: Family Medicine

## 2020-02-24 ENCOUNTER — Other Ambulatory Visit: Payer: Self-pay | Admitting: Family Medicine

## 2020-02-26 ENCOUNTER — Ambulatory Visit: Payer: Commercial Managed Care - PPO | Admitting: Family Medicine

## 2020-04-06 ENCOUNTER — Encounter: Payer: Self-pay | Admitting: *Deleted

## 2020-04-06 ENCOUNTER — Other Ambulatory Visit: Payer: Self-pay | Admitting: Hematology and Oncology

## 2020-04-07 ENCOUNTER — Other Ambulatory Visit: Payer: Self-pay

## 2020-04-07 ENCOUNTER — Encounter: Payer: Self-pay | Admitting: Dermatology

## 2020-04-07 ENCOUNTER — Ambulatory Visit: Payer: Commercial Managed Care - PPO | Admitting: Dermatology

## 2020-04-07 DIAGNOSIS — L570 Actinic keratosis: Secondary | ICD-10-CM

## 2020-04-07 DIAGNOSIS — W57XXXA Bitten or stung by nonvenomous insect and other nonvenomous arthropods, initial encounter: Secondary | ICD-10-CM

## 2020-04-07 DIAGNOSIS — S80862A Insect bite (nonvenomous), left lower leg, initial encounter: Secondary | ICD-10-CM

## 2020-05-16 ENCOUNTER — Encounter: Payer: Self-pay | Admitting: Dermatology

## 2020-05-16 NOTE — Progress Notes (Signed)
   Follow-Up Visit   Subjective  Julie Ross is a 59 y.o. female who presents for the following: Skin Problem (RIGHT POST SHOULDER ITCH, ALSO BITES ON LEFT LOWER LEG ).  Crust Location: Forehead Duration:  Quality:  Associated Signs/Symptoms: Modifying Factors:  Severity:  Timing: Context: Also wants bites on left leg  Objective  Well appearing patient in no apparent distress; mood and affect are within normal limits.  A focused examination was performed including Head, neck, back, arms, legs.. Relevant physical exam findings are noted in the Assessment and Plan.   Assessment & Plan    AK (actinic keratosis) Mid Forehead  Yearly skin check  Destruction of lesion - Mid Forehead Complexity: simple   Destruction method: cryotherapy   Informed consent: discussed and consent obtained   Timeout:  patient name, date of birth, surgical site, and procedure verified Lesion destroyed using liquid nitrogen: Yes   Cryotherapy cycles:  5 Outcome: patient tolerated procedure well with no complications   Post-procedure details: wound care instructions given    Insect bite of left lower leg, initial encounter Left Lower Leg - Anterior  Call if these do not spontaneously resolve in 2 weeks.      I, Lavonna Monarch, MD, have reviewed all documentation for this visit.  The documentation on 05/16/20 for the exam, diagnosis, procedures, and orders are all accurate and complete.

## 2020-05-25 NOTE — Progress Notes (Deleted)
No chief complaint on file.    HISTORY OF PRESENT ILLNESS: Today 05/25/20  Julie Ross is a 59 y.o. female here today for follow up for RRMS. She is not currently on DMT. MRI was ordered 08/2019 but has not been performed.   Gabapentin was increased at last visit   She continues baclofen for muscle spasticity. Alprazolam 0.5mg  TID helps with muscle spasms, anxiety and sleep. Mood  sleep   HISTORY (copied from previous note)  Julie Ross is a 59 y.o. woman with multiple sclerosis and breast cancer.    Update 08/22/2019: She feels her MS is stable.  Last MRI was in 2019 and had shown one new lesion.   She is no longer on a DMT.   She is walking well.   She feels stiff when she stands up.    She feels this is more from the knees than her MS.   She denies any new numbness.   Bladder function is fine.   Muscle spasms are helped by xanax.   It also helps anxiety and sleep.     She notes fatigue .  She often goes back to sleep after she gets up in the morning and feeds the dogs, sleeping another couple hours.  She has trouble getting comfortable.  She has aching in her calves  She had neurosurgery at [facility].   She did need to get Narcan after she was unable to be aroused after opiates post-op.  She is doing better but does note buttock pain if sitting a long time.     She tries to exercise some with a recombent bike  Her breast cancer is doing well.   She sees Dr. Lindi Adie and has been told she is stable.      REVIEW OF SYSTEMS: Out of a complete 14 system review of symptoms, the patient complains only of the following symptoms, and all other reviewed systems are negative.   ALLERGIES: Allergies  Allergen Reactions  . Bee Venom Anaphylaxis     HOME MEDICATIONS: Outpatient Medications Prior to Visit  Medication Sig Dispense Refill  . ALPRAZolam (XANAX) 0.5 MG tablet TAKE 1 TABLET(0.5 MG) BY MOUTH THREE TIMES DAILY AS NEEDED FOR ANXIETY 270 tablet 1  . Ascorbic Acid (VITAMIN C  PO) Take by mouth.    Marland Kitchen atorvastatin (LIPITOR) 20 MG tablet TAKE 1 TABLET DAILY 90 tablet 3  . baclofen (LIORESAL) 10 MG tablet TAKE 1 TABLET THREE TIMES A DAY AS NEEDED FOR MUSCLE SPASMS 270 tablet 3  . buPROPion (WELLBUTRIN XL) 150 MG 24 hr tablet TAKE 1 TABLET DAILY 90 tablet 1  . cetirizine (ZYRTEC) 10 MG tablet TAKE 1 TABLET(10 MG) BY MOUTH DAILY 90 tablet 3  . Cholecalciferol (VITAMIN D3 PO) Take by mouth.    . Cyanocobalamin (VITAMIN B-12 PO) Take by mouth.    . EPINEPHrine 0.3 mg/0.3 mL IJ SOAJ injection Inject 0.3 mLs (0.3 mg total) into the muscle as needed for anaphylaxis. 1 each 1  . fluticasone (FLONASE) 50 MCG/ACT nasal spray Place 2 sprays into both nostrils daily. 16 g 6  . gabapentin (NEURONTIN) 600 MG tablet Take 1 tablet (600 mg total) by mouth 3 (three) times daily. 270 tablet 4  . HYDROcodone-acetaminophen (NORCO/VICODIN) 5-325 MG tablet Take 1 tablet by mouth 2 (two) times daily as needed for pain.    Marland Kitchen KLOR-CON M20 20 MEQ tablet TAKE 1 TABLET DAILY 90 tablet 3  . letrozole (FEMARA) 2.5 MG tablet TAKE 1  TABLET DAILY 90 tablet 3  . levothyroxine (SYNTHROID) 88 MCG tablet TAKE 1 TABLET DAILY BEFORE BREAKFAST 90 tablet 3  . losartan-hydrochlorothiazide (HYZAAR) 100-25 MG tablet TAKE 1 TABLET EVERY MORNING BEFORE BREAKFAST 90 tablet 3  . omeprazole (PRILOSEC) 40 MG capsule TAKE 1 CAPSULE DAILY 90 capsule 3  . ondansetron (ZOFRAN) 4 MG tablet Take 1 tablet (4 mg total) by mouth every 8 (eight) hours as needed for nausea or vomiting. 20 tablet 0  . vitamin E 400 UNIT capsule Take 800 Units by mouth daily.     No facility-administered medications prior to visit.     PAST MEDICAL HISTORY: Past Medical History:  Diagnosis Date  . Anemia    h/o - ? bld. transfusion - more recent- ?2013  . Anxiety    uses xanax mainly for sleep  . Arthritis    knees   . Basal cell carcinoma 01/11/2005   rim left nostril-,mohs  . BCC (basal cell carcinoma) 01/11/2005   right cheek  mid-mohs  . BCC (basal cell carcinoma) 09/12/2006   right forehead -mohs  . BCC (basal cell carcinoma) 01/19/2016   right upper arm -[id number]  . BCC (basal cell carcinoma) 02/18/2019   forehead-mohs  . Breast cancer ([place]) 11/2011   lul/ER+PR+, x1 lymph node   . Cancer (HCC)    squamous cell skin cancer x7  . Depression   . GERD (gastroesophageal reflux disease)   . H/O bone density study 03/2011  . H/O colonoscopy   . H/O echocardiogram    last one 02/2013, due to chemotherapy  . Heart burn   . Hx of radiation therapy 04/17/12- 06/04/12   left chest wall, axilla, L supraclavicular fossa 4500 cGy, left mastectomy scar/chest wall 5940 cGy  . Hypertension   . Hypothyroidism   . Multiple sclerosis ([place])   . Neuromuscular disorder ([place])    MS TX WITH CYMBALTA   . Personal history of chemotherapy   . Personal history of radiation therapy   . SCC (squamous cell carcinoma) 01/11/2005   chest mid  . Sleep apnea    MILD SLEEP APNEA, NO (Needed) MACHINE  6 YRS AGO HPT [facility]   . Squamous cell carcinoma of skin 01/11/2005   over left lip  . Wears glasses      PAST SURGICAL HISTORY: Past Surgical History:  Procedure Laterality Date  . APPLICATION OF ROBOTIC ASSISTANCE FOR SPINAL PROCEDURE N/A 03/12/2019   Procedure: APPLICATION OF ROBOTIC ASSISTANCE FOR SPINAL PROCEDURE;  Surgeon: Judith Part, MD;  Location: [facility];  Service: Neurosurgery;  Laterality: N/A;  . CESAREAN SECTION     X2   . COLONOSCOPY    . ESOPHAGOGASTRODUODENOSCOPY    . FLEXIBLE SIGMOIDOSCOPY    . I & D KNEE WITH POLY EXCHANGE  03/02/2012   Procedure: IRRIGATION AND DEBRIDEMENT KNEE WITH POLY EXCHANGE;  Surgeon: Alta Corning, MD;  Location: [facility];  Service: Orthopedics;  Laterality: Left;  I&D of total knee with Possible Poly Exchange   . JOINT REPLACEMENT  Feb 2013   left knee- multiple surgeries   . KNEE ARTHROSCOPY     LT KNEE 04/2011  . KNEE ARTHROSCOPY  84   rt  . MASTECTOMY    . MASTECTOMY W/  SENTINEL NODE BIOPSY  12/19/2011   Procedure: MASTECTOMY WITH SENTINEL LYMPH NODE BIOPSY;  Surgeon: Haywood Lasso, MD;  Location: [facility];  Service: General;  Laterality: Left;  left breast and left axilla   . MOHS SURGERY  right face  . NO PAST SURGERIES     BLADDER SLING  4-5 YRS AGO   . PORT-A-CATH REMOVAL  03/06/2012   Procedure: REMOVAL PORT-A-CATH;  Surgeon: Odis Hollingshead, MD;  Location: [facility];  Service: General;  Laterality: N/A;  . PORT-A-CATH REMOVAL Right 08/22/2012   Procedure: REMOVAL PORT-A-CATH;  Surgeon: Edward Jolly, MD;  Location: [facility];  Service: General;  Laterality: Right;  . PORTACATH PLACEMENT  01/16/2012   Procedure: INSERTION PORT-A-CATH;  Surgeon: Haywood Lasso, MD;  Location: [facility];  Service: General;  Laterality: Right;  [place] Cath Placement   . PORTACATH PLACEMENT  05/01/2012   Procedure: INSERTION PORT-A-CATH;  Surgeon: Haywood Lasso, MD;  Location: [facility];  Service: General;  Laterality: Right;  right internal jugular port-a-cath insertion  . TEE WITHOUT CARDIOVERSION  03/06/2012   Procedure: TRANSESOPHAGEAL ECHOCARDIOGRAM (TEE);  Surgeon: Josue Hector, MD;  Location: [facility];  Service: Cardiovascular;  Laterality: N/A;  Rm. [id number]  . TONSILLECTOMY    . TOTAL KNEE ARTHROPLASTY  08/15/2011   Procedure: TOTAL KNEE ARTHROPLASTY; lft Surgeon: Alta Corning, MD;  Location: [facility];  Service: Orthopedics;  Laterality: Left;  RIGHT KNEE CORTIZONE INJECTION  . TOTAL KNEE ARTHROPLASTY Left 08/18/2012   Procedure:  Irrigation and debridement of LEFT total knee;  Removal of total knee parts; Implant of spacers;  Surgeon: Kerin Salen, MD;  Location: [facility];  Service: Orthopedics;  Laterality: Left;  . TOTAL KNEE ARTHROPLASTY Right 08/12/2013  . TOTAL KNEE ARTHROPLASTY Right 08/12/2013   Procedure: RIGHT TOTAL KNEE ARTHROPLASTY;  Surgeon: Kerin Salen, MD;  Location: [facility];  Service: Orthopedics;  Laterality: Right;   . TOTAL KNEE REVISION Left 04/19/2013   Procedure: TOTAL KNEE REVISION AND REMOVAL CEMENT SPACER;  Surgeon: Kerin Salen, MD;  Location: [facility];  Service: Orthopedics;  Laterality: Left;  . TRANSFORAMINAL LUMBAR INTERBODY FUSION (TLIF) WITH PEDICLE SCREW FIXATION 1 LEVEL Bilateral 03/12/2019   Procedure: Lumbar four-five Open transforaminal lumbar interbody fusion with Lumbar four-five laminectomy, bilateral Lumbar four-five Pedicle screw placement;  Surgeon: Judith Part, MD;  Location: [facility];  Service: Neurosurgery;  Laterality: Bilateral;  . TUMOR REMOVAL     FROM PELVIS AGE 34     FAMILY HISTORY: Family History  Problem Relation Age of Onset  . Breast cancer Paternal Aunt        dx in her 54s  . Heart disease Maternal Grandfather   . Lung cancer Maternal Uncle        smoker  . Breast cancer Paternal Grandmother        dx in her 32s  . Ovarian cancer Paternal Aunt        dx in her 41s  . Arthritis Mother   . Hypertension Mother   . Heart disease Mother        CHF  . Dementia Father   . Diabetes type II Father      SOCIAL HISTORY: Social History   Socioeconomic History  . Marital status: Married    Spouse name: Not on file  . Number of children: 2  . Years of education: Not on file  . Highest education level: Not on file  Occupational History    Employer: [employer]   Tobacco Use  . Smoking status: Never Smoker  . Smokeless tobacco: Never Used  Vaping Use  . Vaping Use: Never used  Substance and Sexual Activity  . Alcohol use:  No  . Drug use: No  . Sexual activity: Yes  Other Topics Concern  . Not on file  Social History Narrative  . Not on file   Social Determinants of Health   Financial Resource Strain:   . Difficulty of Paying Living Expenses: Not on file  Food Insecurity:   . Worried About Charity fundraiser in the Last Year: Not on file  . Ran Out of Food in the Last Year: Not on file  Transportation Needs:   . Lack of Transportation  (Medical): Not on file  . Lack of Transportation (Non-Medical): Not on file  Physical Activity:   . Days of Exercise per Week: Not on file  . Minutes of Exercise per Session: Not on file  Stress:   . Feeling of Stress : Not on file  Social Connections:   . Frequency of Communication with Friends and Family: Not on file  . Frequency of Social Gatherings with Friends and Family: Not on file  . Attends Religious Services: Not on file  . Active Member of Clubs or Organizations: Not on file  . Attends Archivist Meetings: Not on file  . Marital Status: Not on file  Intimate Partner Violence:   . Fear of Current or Ex-Partner: Not on file  . Emotionally Abused: Not on file  . Physically Abused: Not on file  . Sexually Abused: Not on file      PHYSICAL EXAM  There were no vitals filed for this visit. There is no height or weight on file to calculate BMI.   Generalized: Well developed, in no acute distress  Cardiology: normal rate and rhythm, no murmur auscultated  Respiratory: clear to auscultation bilaterally    Neurological examination  Mentation: Alert oriented to time, place, history taking. Follows all commands speech and language fluent Cranial nerve II-XII: Pupils were equal round reactive to light. Extraocular movements were full, visual field were full on confrontational test. Facial sensation and strength were normal. Uvula tongue midline. Head turning and shoulder shrug  were normal and symmetric. Motor: The motor testing reveals 5 over 5 strength of all 4 extremities. Good symmetric motor tone is noted throughout.  Sensory: Sensory testing is intact to soft touch on all 4 extremities. No evidence of extinction is noted.  Coordination: Cerebellar testing reveals good finger-nose-finger and heel-to-shin bilaterally.  Gait and station: Gait is normal. Tandem gait is normal. Romberg is negative. No drift is seen.  Reflexes: Deep tendon reflexes are symmetric and  normal bilaterally.     DIAGNOSTIC DATA (LABS, IMAGING, TESTING) - I reviewed patient records, labs, notes, testing and imaging myself where available.  Lab Results  Component Value Date   WBC 7.4 10/09/2019   HGB 12.1 10/09/2019   HCT 38.1 10/09/2019   MCV 78.9 (L) 10/09/2019   PLT 274 10/09/2019      Component Value Date/Time   NA 137 11/07/2019 0000   NA 138 08/18/2014 1551   K 4.1 11/07/2019 0000   K 3.7 08/18/2014 1551   CL 99 11/07/2019 0000   CL 100 11/30/2012 1029   CO2 22 11/07/2019 0000   CO2 27 08/18/2014 1551   GLUCOSE 124 (H) 10/09/2019 1458   GLUCOSE 117 08/18/2014 1551   GLUCOSE 91 11/30/2012 1029   BUN 23 (A) 11/07/2019 0000   BUN 17.2 08/18/2014 1551   CREATININE 1.2 (A) 11/07/2019 0000   CREATININE 1.19 (H) 10/09/2019 1458   CREATININE 1.29 (H) 06/26/2017 1603   CREATININE  1.3 (H) 08/18/2014 1551   CALCIUM 9.5 11/07/2019 0000   CALCIUM 9.1 08/18/2014 1551   PROT 7.1 07/18/2019 1408   PROT 7.3 05/31/2018 1545   PROT 7.1 08/18/2014 1551   ALBUMIN 4.4 11/07/2019 0000   ALBUMIN 4.6 05/31/2018 1545   ALBUMIN 4.0 08/18/2014 1551   AST 19 11/07/2019 0000   AST 19 08/18/2014 1551   ALT 15 11/07/2019 0000   ALT 21 08/18/2014 1551   ALKPHOS 196 (A) 11/07/2019 0000   ALKPHOS 137 08/18/2014 1551   BILITOT 0.9 07/18/2019 1408   BILITOT 0.6 05/31/2018 1545   BILITOT 0.46 08/18/2014 1551   GFRNONAA 49 (L) 10/09/2019 1458   GFRAA 57 (L) 10/09/2019 1458   Lab Results  Component Value Date   CHOL 123 11/07/2019   HDL 55 11/07/2019   LDLCALC 49 11/07/2019   LDLDIRECT 158.0 12/17/2014   TRIG 102 11/07/2019   CHOLHDL 2 07/18/2019   Lab Results  Component Value Date   HGBA1C 6.4 11/07/2019   No results found for: VITAMINB12 Lab Results  Component Value Date   TSH 4.82 11/07/2019      ASSESSMENT AND PLAN  59 y.o. year old female  has a past medical history of Anemia, Anxiety, Arthritis, Basal cell carcinoma (01/11/2005), BCC (basal cell  carcinoma) (01/11/2005), BCC (basal cell carcinoma) (09/12/2006), BCC (basal cell carcinoma) (01/19/2016), BCC (basal cell carcinoma) (02/18/2019), Breast cancer ([place]) (11/2011), Cancer ([place]), Depression, GERD (gastroesophageal reflux disease), H/O bone density study (03/2011), H/O colonoscopy, H/O echocardiogram, Heart burn, radiation therapy (04/17/12- 06/04/12), Hypertension, Hypothyroidism, Multiple sclerosis ([place]), Neuromuscular disorder ([place]), Personal history of chemotherapy, Personal history of radiation therapy, SCC (squamous cell carcinoma) (01/11/2005), Sleep apnea, Squamous cell carcinoma of skin (01/11/2005), and Wears glasses. here with ***  Multiple sclerosis (HCC)  Other fatigue  Insomnia, unspecified type  Anxiety state   I spent 20 minutes of face-to-face and non-face-to-face time with patient.  This included previsit chart review, lab review, study review, order entry, electronic health record documentation, patient education.    Debbora Presto, MSN, FNP-C 05/25/2020, 4:46 PM  [facility] [address], [place], [place] [address] [phone]

## 2020-05-25 NOTE — Patient Instructions (Incomplete)
Below is our plan:  We will ***  Please make sure you are staying well hydrated. I recommend 50-60 ounces daily. Well balanced diet and regular exercise encouraged.    Please continue follow up with care team as directed.   Follow up with *** in ***  You may receive a survey regarding today's visit. I encourage you to leave honest feed back as I do use this information to improve patient care. Thank you for seeing me today!      Multiple Sclerosis Multiple sclerosis (MS) is a disease of the brain, spinal cord, and optic nerves (central nervous system). It causes the body's disease-fighting (immune) system to destroy the protective covering (myelin sheath) around nerves in the brain. When this happens, signals (nerve impulses) going to and from the brain and spinal cord do not get sent properly or may not get sent at all. There are several types of MS:  Relapsing-remitting MS. This is the most common type. This causes sudden attacks of symptoms. After an attack, you may recover completely until the next attack, or some symptoms may remain permanently.  Secondary progressive MS. This usually develops after the onset of relapsing-remitting MS. Similar to relapsing-remitting MS, this type also causes sudden attacks of symptoms. Attacks may be less frequent, but symptoms slowly get worse (progress) over time.  Primary progressive MS. This causes symptoms that steadily progress over time. This type of MS does not cause sudden attacks of symptoms. The age of onset of MS varies, but it often develops between 59-47 years of age. MS is a lifelong (chronic) condition. There is no cure, but treatment can help slow down the progression of the disease. What are the causes? The cause of this condition is not known. What increases the risk? You are more likely to develop this condition if:  You are a woman.  You have a relative with MS. However, the condition is not passed from parent to child  (inherited).  You have a lack (deficiency) of vitamin D.  You smoke. MS is more common in the [place] than in the [place]. What are the signs or symptoms? Relapsing-remitting and secondary progressive MS cause symptoms to occur in episodes or attacks that may last weeks to months. There may be long periods between attacks in which there are almost no symptoms. Primary progressive MS causes symptoms to steadily progress after they develop. Symptoms of MS vary because of the many different ways it affects the central nervous system. The main symptoms include:  Vision problems and eye pain.  Numbness.  Weakness.  Inability to move your arms, hands, feet, or legs (paralysis).  Balance problems.  Shaking that you cannot control (tremors).  Muscle spasms.  Problems with thinking (cognitive changes). MS can also cause symptoms that are associated with the disease, but are not always the direct result of an MS attack. They may include:  Inability to control urination or bowel movements (incontinence).  Headaches.  Fatigue.  Inability to tolerate heat.  Emotional changes.  Depression.  Pain. How is this diagnosed? This condition is diagnosed based on:  Your symptoms.  A neurological exam. This involves checking central nervous system function, such as nerve function, reflexes, and coordination.  MRIs of the brain and spinal cord.  Lab tests, including a lumbar puncture that tests the fluid that surrounds the brain and spinal cord (cerebrospinal fluid).  Tests to measure the electrical activity of the brain in response to stimulation (evoked potentials). How  is this treated? There is no cure for MS, but medicines can help decrease the number and frequency of attacks and help relieve nuisance symptoms. Treatment options may include:  Medicines that reduce the frequency of attacks. These medicines may be given by injection, by mouth (orally),  or through an IV.  Medicines that reduce inflammation (steroids). These may provide short-term relief of symptoms.  Medicines to help control pain, depression, fatigue, or incontinence.  Vitamin D, if you have a deficiency.  Using devices to help you move around (assistive devices), such as braces, a cane, or a walker.  Physical therapy to strengthen and stretch your muscles.  Occupational therapy to help you with everyday tasks.  Alternative or complementary treatments such as exercise, massage, or acupuncture. Follow these instructions at home:  Take over-the-counter and prescription medicines only as told by your health care provider.  Do not drive or use heavy machinery while taking prescription pain medicine.  Use assistive devices as recommended by your physical therapist or your health care provider.  Exercise as directed by your health care provider.  Return to your normal activities as told by your health care provider. Ask your health care provider what activities are safe for you.  Reach out for support. Share your feelings with friends, family, or a support group.  Keep all follow-up visits as told by your health care provider and therapists. This is important. Where to find more information  National Multiple Sclerosis Society: https://www.nationalmssociety.org Contact a health care provider if:  You feel depressed.  You develop new pain or numbness.  You have tremors.  You have problems with sexual function. Get help right away if:  You develop paralysis.  You develop numbness.  You have problems with your bladder or bowel function.  You develop double vision.  You lose vision in one or both eyes.  You develop suicidal thoughts.  You develop severe confusion. If you ever feel like you may hurt yourself or others, or have thoughts about taking your own life, get help right away. You can go to your nearest emergency department or call:  Your  local emergency services (911 in the [place]  A suicide crisis helpline, such as the [facility] at [phone]. This is open 24 hours a day. Summary  Multiple sclerosis (MS) is a disease of the central nervous system that causes the body's immune system to destroy the protective covering (myelin sheath) around nerves in the brain.  There are 3 types of MS: relapsing-remitting, secondary progressive, and primary progressive. Relapsing-remitting and secondary progressive MS cause symptoms to occur in episodes or attacks that may last weeks to months. Primary progressive MS causes symptoms to steadily progress after they develop.  There is no cure for MS, but medicines can help decrease the number and frequency of attacks and help relieve nuisance symptoms. Treatment may also include physical or occupational therapy.  If you develop numbness, paralysis, vision problems, or other neurological symptoms, get help right away. This information is not intended to replace advice given to you by your health care provider. Make sure you discuss any questions you have with your health care provider. Document Revised: 05/19/2017 Document Reviewed: 08/15/2016 Elsevier Patient Education  2020 [employer].

## 2020-05-26 ENCOUNTER — Ambulatory Visit: Payer: Commercial Managed Care - PPO | Admitting: Family Medicine

## 2020-05-26 DIAGNOSIS — R5383 Other fatigue: Secondary | ICD-10-CM

## 2020-05-26 DIAGNOSIS — F411 Generalized anxiety disorder: Secondary | ICD-10-CM

## 2020-05-26 DIAGNOSIS — G4700 Insomnia, unspecified: Secondary | ICD-10-CM

## 2020-05-26 DIAGNOSIS — G35 Multiple sclerosis: Secondary | ICD-10-CM

## 2020-05-28 ENCOUNTER — Encounter: Payer: Self-pay | Admitting: Family Medicine

## 2020-05-28 ENCOUNTER — Ambulatory Visit: Payer: Commercial Managed Care - PPO | Admitting: Family Medicine

## 2020-05-28 VITALS — BP 133/79 | HR 62 | Ht 66.0 in | Wt 209.0 lb

## 2020-05-28 DIAGNOSIS — R5383 Other fatigue: Secondary | ICD-10-CM | POA: Diagnosis not present

## 2020-05-28 DIAGNOSIS — G4700 Insomnia, unspecified: Secondary | ICD-10-CM | POA: Diagnosis not present

## 2020-05-28 DIAGNOSIS — F411 Generalized anxiety disorder: Secondary | ICD-10-CM | POA: Diagnosis not present

## 2020-05-28 DIAGNOSIS — G35 Multiple sclerosis: Secondary | ICD-10-CM

## 2020-05-28 NOTE — Progress Notes (Signed)
Chief Complaint  Patient presents with  . Follow-up    Rm 6  . Multiple Sclerosis    Pt said she has not new sx but hte same vision and memory problems.     HISTORY OF PRESENT ILLNESS: Today 05/28/20  Julie Ross is a 59 y.o. female here today for follow up for RRMS. She is not currently on DMT. MRI was ordered 08/2019 but has not been performed.   Gabapentin was increased at last visit,. It has helped some. She continues baclofen for muscle spasticity. Alprazolam 0.5mg  TID helps with muscle spasms, anxiety and sleep. Mood is stable on bupropion. Sleep has always been an issue for her She has trouble falling asleep and staying asleep.  She continues to have difficulty with short-term memory, specifically remembering characters in a book. She wants to get back to exercising but is scared to go to the gym.   HISTORY (copied from previous note)  Julie Ross is a 59 y.o. woman with multiple sclerosis and breast cancer.    Update 08/22/2019: She feels her MS is stable.  Last MRI was in 2019 and had shown one new lesion.   She is no longer on a DMT.   She is walking well.   She feels stiff when she stands up.    She feels this is more from the knees than her MS.   She denies any new numbness.   Bladder function is fine.   Muscle spasms are helped by xanax.   It also helps anxiety and sleep.     She notes fatigue .  She often goes back to sleep after she gets up in the morning and feeds the dogs, sleeping another couple hours.  She has trouble getting comfortable.  She has aching in her calves  She had neurosurgery at [facility].   She did need to get Narcan after she was unable to be aroused after opiates post-op.  She is doing better but does note buttock pain if sitting a long time.     She tries to exercise some with a recombent bike  Her breast cancer is doing well.   She sees Dr. Lindi Adie and has been told she is stable.      REVIEW OF SYSTEMS: Out of a complete 14 system review of  symptoms, the patient complains only of the following symptoms,fatigue, anxiety, muscle spasms, memory loss and all other reviewed systems are negative.   ALLERGIES: Allergies  Allergen Reactions  . Bee Venom Anaphylaxis     HOME MEDICATIONS: Outpatient Medications Prior to Visit  Medication Sig Dispense Refill  . ALPRAZolam (XANAX) 0.5 MG tablet TAKE 1 TABLET(0.5 MG) BY MOUTH THREE TIMES DAILY AS NEEDED FOR ANXIETY 270 tablet 1  . Ascorbic Acid (VITAMIN C PO) Take by mouth.    Marland Kitchen atorvastatin (LIPITOR) 20 MG tablet TAKE 1 TABLET DAILY 90 tablet 3  . baclofen (LIORESAL) 10 MG tablet TAKE 1 TABLET THREE TIMES A DAY AS NEEDED FOR MUSCLE SPASMS 270 tablet 3  . buPROPion (WELLBUTRIN XL) 150 MG 24 hr tablet TAKE 1 TABLET DAILY 90 tablet 1  . cetirizine (ZYRTEC) 10 MG tablet TAKE 1 TABLET(10 MG) BY MOUTH DAILY 90 tablet 3  . Cholecalciferol (VITAMIN D3 PO) Take by mouth.    . Cyanocobalamin (VITAMIN B-12 PO) Take by mouth.    . EPINEPHrine 0.3 mg/0.3 mL IJ SOAJ injection Inject 0.3 mLs (0.3 mg total) into the muscle as needed for anaphylaxis. 1 each  1  . fluticasone (FLONASE) 50 MCG/ACT nasal spray Place 2 sprays into both nostrils daily. 16 g 6  . gabapentin (NEURONTIN) 600 MG tablet Take 1 tablet (600 mg total) by mouth 3 (three) times daily. 270 tablet 4  . HYDROcodone-acetaminophen (NORCO/VICODIN) 5-325 MG tablet Take 1 tablet by mouth 2 (two) times daily as needed for pain.    Marland Kitchen KLOR-CON M20 20 MEQ tablet TAKE 1 TABLET DAILY 90 tablet 3  . letrozole (FEMARA) 2.5 MG tablet TAKE 1 TABLET DAILY 90 tablet 3  . levothyroxine (SYNTHROID) 88 MCG tablet TAKE 1 TABLET DAILY BEFORE BREAKFAST 90 tablet 3  . losartan-hydrochlorothiazide (HYZAAR) 100-25 MG tablet TAKE 1 TABLET EVERY MORNING BEFORE BREAKFAST 90 tablet 3  . omeprazole (PRILOSEC) 40 MG capsule TAKE 1 CAPSULE DAILY 90 capsule 3  . ondansetron (ZOFRAN) 4 MG tablet Take 1 tablet (4 mg total) by mouth every 8 (eight) hours as needed for  nausea or vomiting. 20 tablet 0  . vitamin E 400 UNIT capsule Take 800 Units by mouth daily.     No facility-administered medications prior to visit.     PAST MEDICAL HISTORY: Past Medical History:  Diagnosis Date  . Anemia    h/o - ? bld. transfusion - more recent- ?2013  . Anxiety    uses xanax mainly for sleep  . Arthritis    knees   . Basal cell carcinoma 01/11/2005   rim left nostril-,mohs  . BCC (basal cell carcinoma) 01/11/2005   right cheek mid-mohs  . BCC (basal cell carcinoma) 09/12/2006   right forehead -mohs  . BCC (basal cell carcinoma) 01/19/2016   right upper arm -[id number]  . BCC (basal cell carcinoma) 02/18/2019   forehead-mohs  . Breast cancer ([facility]) 11/2011   lul/ER+PR+, x1 lymph node   . Cancer ([facility])    squamous cell skin cancer x7  . Depression   . GERD (gastroesophageal reflux disease)   . H/O bone density study 03/2011  . H/O colonoscopy   . H/O echocardiogram    last one 02/2013, due to chemotherapy  . Heart burn   . Hx of radiation therapy 04/17/12- 06/04/12   left chest wall, axilla, L supraclavicular fossa 4500 cGy, left mastectomy scar/chest wall 5940 cGy  . Hypertension   . Hypothyroidism   . Multiple sclerosis ([place])   . Neuromuscular disorder ([place])    MS TX WITH CYMBALTA   . Personal history of chemotherapy   . Personal history of radiation therapy   . SCC (squamous cell carcinoma) 01/11/2005   chest mid  . Sleep apnea    MILD SLEEP APNEA, NO (Needed) MACHINE  6 YRS AGO HPT [facility]   . Squamous cell carcinoma of skin 01/11/2005   over left lip  . Wears glasses      PAST SURGICAL HISTORY: Past Surgical History:  Procedure Laterality Date  . APPLICATION OF ROBOTIC ASSISTANCE FOR SPINAL PROCEDURE N/A 03/12/2019   Procedure: APPLICATION OF ROBOTIC ASSISTANCE FOR SPINAL PROCEDURE;  Surgeon: Judith Part, MD;  Location: [facility];  Service: Neurosurgery;  Laterality: N/A;  . CESAREAN SECTION     X2   . COLONOSCOPY    .  ESOPHAGOGASTRODUODENOSCOPY    . FLEXIBLE SIGMOIDOSCOPY    . I & D KNEE WITH POLY EXCHANGE  03/02/2012   Procedure: IRRIGATION AND DEBRIDEMENT KNEE WITH POLY EXCHANGE;  Surgeon: Alta Corning, MD;  Location: [facility];  Service: Orthopedics;  Laterality: Left;  I&D of total knee with Possible Poly  Exchange   . JOINT REPLACEMENT  Feb 2013   left knee- multiple surgeries   . KNEE ARTHROSCOPY     LT KNEE 04/2011  . KNEE ARTHROSCOPY  84   rt  . MASTECTOMY    . MASTECTOMY W/ SENTINEL NODE BIOPSY  12/19/2011   Procedure: MASTECTOMY WITH SENTINEL LYMPH NODE BIOPSY;  Surgeon: Haywood Lasso, MD;  Location: [facility];  Service: General;  Laterality: Left;  left breast and left axilla   . MOHS SURGERY     right face  . NO PAST SURGERIES     BLADDER SLING  4-5 YRS AGO   . PORT-A-CATH REMOVAL  03/06/2012   Procedure: REMOVAL PORT-A-CATH;  Surgeon: Odis Hollingshead, MD;  Location: [facility];  Service: General;  Laterality: N/A;  . PORT-A-CATH REMOVAL Right 08/22/2012   Procedure: REMOVAL PORT-A-CATH;  Surgeon: Edward Jolly, MD;  Location: [facility];  Service: General;  Laterality: Right;  . PORTACATH PLACEMENT  01/16/2012   Procedure: INSERTION PORT-A-CATH;  Surgeon: Haywood Lasso, MD;  Location: [facility];  Service: General;  Laterality: Right;  [place] Cath Placement   . PORTACATH PLACEMENT  05/01/2012   Procedure: INSERTION PORT-A-CATH;  Surgeon: Haywood Lasso, MD;  Location: [facility];  Service: General;  Laterality: Right;  right internal jugular port-a-cath insertion  . TEE WITHOUT CARDIOVERSION  03/06/2012   Procedure: TRANSESOPHAGEAL ECHOCARDIOGRAM (TEE);  Surgeon: Josue Hector, MD;  Location: [facility];  Service: Cardiovascular;  Laterality: N/A;  Rm. [id number]  . TONSILLECTOMY    . TOTAL KNEE ARTHROPLASTY  08/15/2011   Procedure: TOTAL KNEE ARTHROPLASTY; lft Surgeon: Alta Corning, MD;  Location: [facility];  Service: Orthopedics;  Laterality: Left;  RIGHT KNEE  CORTIZONE INJECTION  . TOTAL KNEE ARTHROPLASTY Left 08/18/2012   Procedure:  Irrigation and debridement of LEFT total knee;  Removal of total knee parts; Implant of spacers;  Surgeon: Kerin Salen, MD;  Location: [facility];  Service: Orthopedics;  Laterality: Left;  . TOTAL KNEE ARTHROPLASTY Right 08/12/2013  . TOTAL KNEE ARTHROPLASTY Right 08/12/2013   Procedure: RIGHT TOTAL KNEE ARTHROPLASTY;  Surgeon: Kerin Salen, MD;  Location: [facility];  Service: Orthopedics;  Laterality: Right;  . TOTAL KNEE REVISION Left 04/19/2013   Procedure: TOTAL KNEE REVISION AND REMOVAL CEMENT SPACER;  Surgeon: Kerin Salen, MD;  Location: [facility];  Service: Orthopedics;  Laterality: Left;  . TRANSFORAMINAL LUMBAR INTERBODY FUSION (TLIF) WITH PEDICLE SCREW FIXATION 1 LEVEL Bilateral 03/12/2019   Procedure: Lumbar four-five Open transforaminal lumbar interbody fusion with Lumbar four-five laminectomy, bilateral Lumbar four-five Pedicle screw placement;  Surgeon: Judith Part, MD;  Location: [facility];  Service: Neurosurgery;  Laterality: Bilateral;  . TUMOR REMOVAL     FROM PELVIS AGE 31     FAMILY HISTORY: Family History  Problem Relation Age of Onset  . Breast cancer Paternal Aunt        dx in her 25s  . Heart disease Maternal Grandfather   . Lung cancer Maternal Uncle        smoker  . Breast cancer Paternal Grandmother        dx in her 1s  . Ovarian cancer Paternal Aunt        dx in her 33s  . Arthritis Mother   . Hypertension Mother   . Heart disease Mother        CHF  . Dementia Father   . Diabetes type II Father  SOCIAL HISTORY: Social History   Socioeconomic History  . Marital status: Married    Spouse name: Not on file  . Number of children: 2  . Years of education: Not on file  . Highest education level: Not on file  Occupational History    Employer: [employer]   Tobacco Use  . Smoking status: Never Smoker  . Smokeless tobacco: Never Used  Vaping Use  . Vaping Use: Never used   Substance and Sexual Activity  . Alcohol use: No  . Drug use: No  . Sexual activity: Yes  Other Topics Concern  . Not on file  Social History Narrative  . Not on file   Social Determinants of Health   Financial Resource Strain: Not on file  Food Insecurity: Not on file  Transportation Needs: Not on file  Physical Activity: Not on file  Stress: Not on file  Social Connections: Not on file  Intimate Partner Violence: Not on file      PHYSICAL EXAM  Vitals:   05/28/20 1402  BP: 133/79  Pulse: 62  Weight: 209 lb (94.8 kg)  Height: 5\' 6"  (1.676 m)   Body mass index is 33.73 kg/m.   Generalized: Well developed, in no acute distress  Cardiology: normal rate and rhythm, no murmur auscultated  Respiratory: clear to auscultation bilaterally    Neurological examination  Mentation: Alert oriented to time, place, history taking. Follows all commands speech and language fluent Cranial nerve II-XII: Pupils were equal round reactive to light. Extraocular movements were full, visual field were full on confrontational test. Facial sensation and strength were normal. Uvula tongue midline. Head turning and shoulder shrug  were normal and symmetric. Motor: The motor testing reveals 5 over 5 strength of all 4 extremities.   Sensory: Sensory testing is intact to soft touch on all 4 extremities. No evidence of extinction is noted.  Coordination: Cerebellar testing reveals good finger-nose-finger and heel-to-shin bilaterally.  Gait and station: Gait is normal.  Reflexes: Unable to assess, patient declines exam due to concerns of left knee pain    DIAGNOSTIC DATA (LABS, IMAGING, TESTING) - I reviewed patient records, labs, notes, testing and imaging myself where available.  Lab Results  Component Value Date   WBC 7.4 10/09/2019   HGB 12.1 10/09/2019   HCT 38.1 10/09/2019   MCV 78.9 (L) 10/09/2019   PLT 274 10/09/2019      Component Value Date/Time   NA 137 11/07/2019 0000    NA 138 08/18/2014 1551   K 4.1 11/07/2019 0000   K 3.7 08/18/2014 1551   CL 99 11/07/2019 0000   CL 100 11/30/2012 1029   CO2 22 11/07/2019 0000   CO2 27 08/18/2014 1551   GLUCOSE 124 (H) 10/09/2019 1458   GLUCOSE 117 08/18/2014 1551   GLUCOSE 91 11/30/2012 1029   BUN 23 (A) 11/07/2019 0000   BUN 17.2 08/18/2014 1551   CREATININE 1.2 (A) 11/07/2019 0000   CREATININE 1.19 (H) 10/09/2019 1458   CREATININE 1.29 (H) 06/26/2017 1603   CREATININE 1.3 (H) 08/18/2014 1551   CALCIUM 9.5 11/07/2019 0000   CALCIUM 9.1 08/18/2014 1551   PROT 7.1 07/18/2019 1408   PROT 7.3 05/31/2018 1545   PROT 7.1 08/18/2014 1551   ALBUMIN 4.4 11/07/2019 0000   ALBUMIN 4.6 05/31/2018 1545   ALBUMIN 4.0 08/18/2014 1551   AST 19 11/07/2019 0000   AST 19 08/18/2014 1551   ALT 15 11/07/2019 0000   ALT 21 08/18/2014 1551  ALKPHOS 196 (A) 11/07/2019 0000   ALKPHOS 137 08/18/2014 1551   BILITOT 0.9 07/18/2019 1408   BILITOT 0.6 05/31/2018 1545   BILITOT 0.46 08/18/2014 1551   GFRNONAA 49 (L) 10/09/2019 1458   GFRAA 57 (L) 10/09/2019 1458   Lab Results  Component Value Date   CHOL 123 11/07/2019   HDL 55 11/07/2019   LDLCALC 49 11/07/2019   LDLDIRECT 158.0 12/17/2014   TRIG 102 11/07/2019   CHOLHDL 2 07/18/2019   Lab Results  Component Value Date   HGBA1C 6.4 11/07/2019   No results found for: VITAMINB12 Lab Results  Component Value Date   TSH 4.82 11/07/2019      ASSESSMENT AND PLAN  59 y.o. year old female  has a past medical history of Anemia, Anxiety, Arthritis, Basal cell carcinoma (01/11/2005), BCC (basal cell carcinoma) (01/11/2005), BCC (basal cell carcinoma) (09/12/2006), BCC (basal cell carcinoma) (01/19/2016), BCC (basal cell carcinoma) (02/18/2019), Breast cancer ([place]) (11/2011), Cancer ([facility]), Depression, GERD (gastroesophageal reflux disease), H/O bone density study (03/2011), H/O colonoscopy, H/O echocardiogram, Heart burn, radiation therapy (04/17/12- 06/04/12), Hypertension,  Hypothyroidism, Multiple sclerosis ([place]), Neuromuscular disorder ([place]), Personal history of chemotherapy, Personal history of radiation therapy, SCC (squamous cell carcinoma) (01/11/2005), Sleep apnea, Squamous cell carcinoma of skin (01/11/2005), and Wears glasses. here with   Multiple sclerosis ([facility])  Other fatigue  Insomnia, unspecified type  Anxiety state   Melyna is doing fairly well from an MS standpoint.  She continues baclofen, gabapentin and Xanax as prescribed.  She does continue to have concerns of chronic insomnia and memory loss.  We have discussed several options to help with management of insomnia including healthy lifestyle habits, exercise and sleep hygiene.  She usually only takes 1 Xanax at night.  May consider taking an extra dose as needed for insomnia.  We have discussed considering formal neurocognitive testing, however, she declines.  She does wish to update her MRI and request an open MRI.  I will help get this scheduled for her.  She was encouraged to resume an exercise regimen.  Healthy diet and sleep hygiene reviewed.  Memory compensation strategies provided in AVS.  She will follow-up with Dr. Felecia Shelling in 6 months, sooner if needed.  She verbalizes understanding and agreement with this plan.  I spent 30 minutes of face-to-face and non-face-to-face time with patient.  This included previsit chart review, lab review, study review, order entry, electronic health record documentation, patient education.    Debbora Presto, MSN, FNP-C 05/28/2020, 3:05 PM  [facility] [address] [place], [place] [address] [phone]

## 2020-05-28 NOTE — Patient Instructions (Signed)
Below is our plan:  We will continue current treatment plan. Consider formal memory testing if you wish.   Please make sure you are staying well hydrated. I recommend 50-60 ounces daily. Well balanced diet and regular exercise encouraged.    Please continue follow up with care team as directed.   Follow up with Dr Felecia Shelling in 6 months   You may receive a survey regarding today's visit. I encourage you to leave honest feed back as I do use this information to improve patient care. Thank you for seeing me today!    Memory Compensation Strategies  1. Use "WARM" strategy.  W= write it down  A= associate it  R= repeat it  M= make a mental note  2.   You can keep a Social worker.  Use a 3-ring notebook with sections for the following: calendar, important names and phone numbers,  medications, doctors' names/phone numbers, lists/reminders, and a section to journal what you did  each day.   3.    Use a calendar to write appointments down.  4.    Write yourself a schedule for the day.  This can be placed on the calendar or in a separate section of the Memory Notebook.  Keeping a  regular schedule can help memory.  5.    Use medication organizer with sections for each day or morning/evening pills.  You may need help loading it  6.    Keep a basket, or pegboard by the door.  Place items that you need to take out with you in the basket or on the pegboard.  You may also want to  include a message board for reminders.  7.    Use sticky notes.  Place sticky notes with reminders in a place where the task is performed.  For example: " turn off the  stove" placed by the stove, "lock the door" placed on the door at eye level, " take your medications" on  the bathroom mirror or by the place where you normally take your medications.  8.    Use alarms/timers.  Use while cooking to remind yourself to check on food or as a reminder to take your medicine, or as a  reminder to make a call, or as a  reminder to perform another task, etc.    Multiple Sclerosis Multiple sclerosis (MS) is a disease of the brain, spinal cord, and optic nerves (central nervous system). It causes the body's disease-fighting (immune) system to destroy the protective covering (myelin sheath) around nerves in the brain. When this happens, signals (nerve impulses) going to and from the brain and spinal cord do not get sent properly or may not get sent at all. There are several types of MS:  Relapsing-remitting MS. This is the most common type. This causes sudden attacks of symptoms. After an attack, you may recover completely until the next attack, or some symptoms may remain permanently.  Secondary progressive MS. This usually develops after the onset of relapsing-remitting MS. Similar to relapsing-remitting MS, this type also causes sudden attacks of symptoms. Attacks may be less frequent, but symptoms slowly get worse (progress) over time.  Primary progressive MS. This causes symptoms that steadily progress over time. This type of MS does not cause sudden attacks of symptoms. The age of onset of MS varies, but it often develops between 85-32 years of age. MS is a lifelong (chronic) condition. There is no cure, but treatment can help slow down the progression of the disease.  What are the causes? The cause of this condition is not known. What increases the risk? You are more likely to develop this condition if:  You are a woman.  You have a relative with MS. However, the condition is not passed from parent to child (inherited).  You have a lack (deficiency) of vitamin D.  You smoke. MS is more common in the [place] than in the [place]. What are the signs or symptoms? Relapsing-remitting and secondary progressive MS cause symptoms to occur in episodes or attacks that may last weeks to months. There may be long periods between attacks in which there are almost no symptoms. Primary  progressive MS causes symptoms to steadily progress after they develop. Symptoms of MS vary because of the many different ways it affects the central nervous system. The main symptoms include:  Vision problems and eye pain.  Numbness.  Weakness.  Inability to move your arms, hands, feet, or legs (paralysis).  Balance problems.  Shaking that you cannot control (tremors).  Muscle spasms.  Problems with thinking (cognitive changes). MS can also cause symptoms that are associated with the disease, but are not always the direct result of an MS attack. They may include:  Inability to control urination or bowel movements (incontinence).  Headaches.  Fatigue.  Inability to tolerate heat.  Emotional changes.  Depression.  Pain. How is this diagnosed? This condition is diagnosed based on:  Your symptoms.  A neurological exam. This involves checking central nervous system function, such as nerve function, reflexes, and coordination.  MRIs of the brain and spinal cord.  Lab tests, including a lumbar puncture that tests the fluid that surrounds the brain and spinal cord (cerebrospinal fluid).  Tests to measure the electrical activity of the brain in response to stimulation (evoked potentials). How is this treated? There is no cure for MS, but medicines can help decrease the number and frequency of attacks and help relieve nuisance symptoms. Treatment options may include:  Medicines that reduce the frequency of attacks. These medicines may be given by injection, by mouth (orally), or through an IV.  Medicines that reduce inflammation (steroids). These may provide short-term relief of symptoms.  Medicines to help control pain, depression, fatigue, or incontinence.  Vitamin D, if you have a deficiency.  Using devices to help you move around (assistive devices), such as braces, a cane, or a walker.  Physical therapy to strengthen and stretch your muscles.  Occupational  therapy to help you with everyday tasks.  Alternative or complementary treatments such as exercise, massage, or acupuncture. Follow these instructions at home:  Take over-the-counter and prescription medicines only as told by your health care provider.  Do not drive or use heavy machinery while taking prescription pain medicine.  Use assistive devices as recommended by your physical therapist or your health care provider.  Exercise as directed by your health care provider.  Return to your normal activities as told by your health care provider. Ask your health care provider what activities are safe for you.  Reach out for support. Share your feelings with friends, family, or a support group.  Keep all follow-up visits as told by your health care provider and therapists. This is important. Where to find more information  National Multiple Sclerosis Society: https://www.nationalmssociety.org Contact a health care provider if:  You feel depressed.  You develop new pain or numbness.  You have tremors.  You have problems with sexual function. Get help right away if:  You  develop paralysis.  You develop numbness.  You have problems with your bladder or bowel function.  You develop double vision.  You lose vision in one or both eyes.  You develop suicidal thoughts.  You develop severe confusion. If you ever feel like you may hurt yourself or others, or have thoughts about taking your own life, get help right away. You can go to your nearest emergency department or call:  Your local emergency services (911 in the [place]  A suicide crisis helpline, such as the [facility] at (843)780-4351. This is open 24 hours a day. Summary  Multiple sclerosis (MS) is a disease of the central nervous system that causes the body's immune system to destroy the protective covering (myelin sheath) around nerves in the brain.  There are 3 types of MS:  relapsing-remitting, secondary progressive, and primary progressive. Relapsing-remitting and secondary progressive MS cause symptoms to occur in episodes or attacks that may last weeks to months. Primary progressive MS causes symptoms to steadily progress after they develop.  There is no cure for MS, but medicines can help decrease the number and frequency of attacks and help relieve nuisance symptoms. Treatment may also include physical or occupational therapy.  If you develop numbness, paralysis, vision problems, or other neurological symptoms, get help right away. This information is not intended to replace advice given to you by your health care provider. Make sure you discuss any questions you have with your health care provider. Document Revised: 05/19/2017 Document Reviewed: 08/15/2016 Elsevier Patient Education  2020 [employer].

## 2020-05-29 NOTE — Progress Notes (Signed)
I have read the note, and I agree with the clinical assessment and plan.  Conlan Miceli A. Pa Tennant, MD, PhD, FAAN Certified in Neurology, Clinical Neurophysiology, Sleep Medicine, Pain Medicine and Neuroimaging  [facility] [address], [address] [place], [place] [address] [phone]  

## 2020-06-01 ENCOUNTER — Telehealth: Payer: Self-pay | Admitting: Neurology

## 2020-06-01 NOTE — Telephone Encounter (Signed)
06/01/20 patient would like open mri order faxed to [facility] they will reach out to the patient to schedule EE  09/03/2019 pt wants to speak with dr first/ [facility]  09/02/19 [facility] Auth: NPR Ref # [id number] on 09/02/19 order sent to [facility]

## 2020-06-08 ENCOUNTER — Telehealth: Payer: Self-pay | Admitting: Family Medicine

## 2020-06-08 NOTE — Telephone Encounter (Signed)
[facility] Kathlee Nations) called, Pt has 12/28/ appt at 12p for head with/without contrast. A PA is required through [facility].  Can call me direct [phone].

## 2020-06-09 ENCOUNTER — Other Ambulatory Visit: Payer: Self-pay

## 2020-06-09 ENCOUNTER — Other Ambulatory Visit: Payer: Self-pay | Admitting: Family Medicine

## 2020-06-09 ENCOUNTER — Encounter: Payer: Self-pay | Admitting: Family Medicine

## 2020-06-09 MED ORDER — CETIRIZINE HCL 10 MG PO TABS: ORAL_TABLET | ORAL | 0 refills | Status: DC

## 2020-06-09 NOTE — Telephone Encounter (Signed)
I called [facility] and spoke with Carole Binning he informed me that no auth was require. I called Kathlee Nations and relayed this message to her.

## 2020-06-16 DIAGNOSIS — D329 Benign neoplasm of meninges, unspecified: Secondary | ICD-10-CM | POA: Insufficient documentation

## 2020-06-17 ENCOUNTER — Telehealth: Payer: Self-pay | Admitting: Neurology

## 2020-06-17 NOTE — Telephone Encounter (Signed)
Received a MRI report from [facility] that patient had completed. MRI was ordered by Dr Epimenio Foot. Dr. Epimenio Foot is out of the office this week, will provide Dr Frances Furbish the work in MD the copy of the report to review and result for Dr Epimenio Foot. I will contact the patient with the result once reviewed.

## 2020-06-17 NOTE — Telephone Encounter (Signed)
I reviewed her brain MRI report from [facility] from 06/16/2020.  Patient had a brain MRI with and without contrast.  Impression: Interval enlargement of the presumed meningioma along the left lesser sphenoid wing, just lateral to the left anterior clinoid process.  It now measures approximately 16 mm AP x16 mm transverse x12 mm craniocaudal (previously approximately 10 x 10 x 9 mm).  Interval mild enlargement of the presumed meningioma along the undersurface of the anteromedial left tentorium cerebelli.  It now measures approximately 9 mm (previously approximately 7 mm).  It abuts the cisternal segment of the left trigeminal nerve in the left lateral upper pons.  Findings consistent with the stated clinical diagnosis of multiple sclerosis.  No definitive new lesions.  No findings to indicate active demyelination.  Please call patient regarding her brain MRI results.  There are no evidence of active MS lesions but chronic lesions were seen.  She has evidence of benign brain tumors called meningiomas.  These have increased in size compared to September 2018 by a few millimeters.  We can await Dr. Bonnita Hollow return to see if he would recommend neurosurgical evaluation.  For now, no immediate action is required.

## 2020-06-17 NOTE — Telephone Encounter (Signed)
Called the pt to review the results. There was no answer. LVM for the pt to call back

## 2020-06-18 NOTE — Telephone Encounter (Signed)
Pt returned call and I was able to review the information with her that Dr Frances Furbish reviewed. The patient concerned as in the past this was described as a cyst rather than called tumor/menigioma. I informed her that there were no new active MS lesions. Advised I would make sure this information is forwarded to Dr Epimenio Foot so he may review upon his return in the office. Advised that we will contact her back with any additional comments or concerns he may have in regards to the imaging. She would really like to hear what Dr Epimenio Foot says since it is months before she comes back.

## 2020-06-22 ENCOUNTER — Telehealth: Payer: Self-pay | Admitting: Neurology

## 2020-06-22 NOTE — Telephone Encounter (Signed)
She had an MRI of the brain performed at [facility].  I do not have the actual images.  By report, the MS is stable.  As seen previously, there are 2 meningiomas and per the report they both have enlarged.  I discussed this with her that I need to take a look at the actual MRI to compare with the 2019 MRI (they had compared to the 2018 MRI).  If I agree that the meningiomas are enlarging I would want her to see neurosurgery.  Of note, the meningioma is were enlarged in 2019 compared to 2017.

## 2020-06-23 ENCOUNTER — Telehealth: Payer: Self-pay | Admitting: Neurology

## 2020-06-23 DIAGNOSIS — D329 Benign neoplasm of meninges, unspecified: Secondary | ICD-10-CM

## 2020-06-23 NOTE — Telephone Encounter (Signed)
I compared the recent brain MRI from 06/16/2020 [facility]) to the brain MRI performed in the [facility] 04/13/2018.  As seen previously, there are 2 meningiomas, 1 in the left sphenoid wing region and another in the left tentorial membrane.  The sphenoid wing meningioma currently measures 16 x 14 x 7 mm, significantly increased compared to the 2019 MRI.  The tentorial meningioma measures 7 to 8 mm in longest diameter, also increased in size compared to the 2019 MRI.  Because the sphenoid wing meningioma has increased quite a bit and serial MRIs over the past 5 years, I would like her to see neurosurgery to help decide if an intervention is necessary.  Allowing for differences in technique and slice angle, there are no definite new MS findings.

## 2020-06-26 DIAGNOSIS — R030 Elevated blood-pressure reading, without diagnosis of hypertension: Secondary | ICD-10-CM | POA: Insufficient documentation

## 2020-07-01 ENCOUNTER — Other Ambulatory Visit: Payer: Self-pay | Admitting: Neurology

## 2020-07-08 NOTE — Progress Notes (Signed)
Patient Care Team: Midge Minium, MD as PCP - General (Family Medicine) Juanda Chance, NP as Nurse Practitioner (Obstetrics and Gynecology) Gatha Mayer, MD as Consulting Physician (Gastroenterology) Frederik Pear, MD as Consulting Physician (Orthopedic Surgery) Lavonna Monarch, MD as Consulting Physician (Dermatology)  DIAGNOSIS:    ICD-10-CM   1. Malignant neoplasm of upper-outer quadrant of right breast in female, estrogen receptor positive ([place])  C50.411    Z17.0     SUMMARY OF ONCOLOGIC HISTORY: Oncology History  Breast cancer of upper-outer quadrant of right female breast ([place])  11/22/2011 Initial Diagnosis   Cancer of upper-outer quadrant of female breast   12/19/2011 Surgery   Left mastectomy with SLN biopsy 2 foci of invasive ductal carcinoma 0.8 and 0.6 cm low-grade ER PR positive HER-2 positive ratio 3.27 Ki-67 11% one lymph node positive out of 4 (T1, N1, M0 stage II)   02/16/2012 - 04/14/2013 Chemotherapy   Taxotere, carboplatin, Herceptin x2 cycles complicated by bacteremia and left knee sepsis requiring revision of the knee. Herceptin maintenance every 3 weeks. Herceptin maintenance completed 04/14/2013   04/17/2012 - 06/04/2012 Radiation Therapy   Radiation therapy to the breast   03/13/2013 -  Anti-estrogen oral therapy   Letrozole 2.5 mg once daily     CHIEF COMPLIANT: Follow-up of left breast cancer on letrozole therapy  INTERVAL HISTORY: Julie Ross is a 60 y.o. with above-mentioned history of left breast cancer treated with mastectomy, adjuvant chemotherapy, radiation,and who is currently on letrozole. She presents to the clinic today for annual follow-up.    She completed 7 years of antiestrogen therapy and is ready to stop it.  She has lost 25 pounds since last year.  She denies any lumps or nodules in the right breast or axilla.  [facility]  She had a brain MRI that showed 2 meningiomas that have slightly  increased in size and she will need stereotactic radiosurgery.  ALLERGIES:  is allergic to bee venom.  MEDICATIONS:  Current Outpatient Medications  Medication Sig Dispense Refill  . ALPRAZolam (XANAX) 0.5 MG tablet TAKE 1 TABLET(0.5 MG) BY MOUTH THREE TIMES DAILY AS NEEDED FOR ANXIETY 90 tablet 5  . Ascorbic Acid (VITAMIN C PO) Take by mouth.    Marland Kitchen atorvastatin (LIPITOR) 20 MG tablet TAKE 1 TABLET DAILY 90 tablet 3  . baclofen (LIORESAL) 10 MG tablet TAKE 1 TABLET THREE TIMES A DAY AS NEEDED FOR MUSCLE SPASMS 270 tablet 3  . buPROPion (WELLBUTRIN XL) 150 MG 24 hr tablet TAKE 1 TABLET DAILY 90 tablet 3  . cetirizine (ZYRTEC) 10 MG tablet TAKE 1 TABLET(10 MG) BY MOUTH DAILY 30 tablet 0  . Cholecalciferol (VITAMIN D3 PO) Take by mouth.    . Cyanocobalamin (VITAMIN B-12 PO) Take by mouth.    . EPINEPHrine 0.3 mg/0.3 mL IJ SOAJ injection Inject 0.3 mLs (0.3 mg total) into the muscle as needed for anaphylaxis. 1 each 1  . fluticasone (FLONASE) 50 MCG/ACT nasal spray Place 2 sprays into both nostrils daily. 16 g 6  . gabapentin (NEURONTIN) 600 MG tablet Take 1 tablet (600 mg total) by mouth 3 (three) times daily. 270 tablet 4  . HYDROcodone-acetaminophen (NORCO/VICODIN) 5-325 MG tablet Take 1 tablet by mouth 2 (two) times daily as needed for pain.    Marland Kitchen KLOR-CON M20 20 MEQ tablet TAKE 1 TABLET DAILY 90 tablet 3  . levothyroxine (SYNTHROID) 88 MCG tablet TAKE 1 TABLET DAILY BEFORE BREAKFAST 90 tablet 3  .  losartan-hydrochlorothiazide (HYZAAR) 100-25 MG tablet TAKE 1 TABLET EVERY MORNING BEFORE BREAKFAST 90 tablet 3  . omeprazole (PRILOSEC) 40 MG capsule TAKE 1 CAPSULE DAILY 90 capsule 3  . ondansetron (ZOFRAN) 4 MG tablet Take 1 tablet (4 mg total) by mouth every 8 (eight) hours as needed for nausea or vomiting. 20 tablet 0  . vitamin E 400 UNIT capsule Take 800 Units by mouth daily.     No current facility-administered medications for this visit.    PHYSICAL EXAMINATION: ECOG PERFORMANCE  STATUS: 1 - Symptomatic but completely ambulatory  Vitals:   07/09/20 1504  BP: (!) 122/51  Pulse: 73  Resp: 17  Temp: 97.9 F (36.6 C)  SpO2: 98%   Filed Weights   07/09/20 1504  Weight: 208 lb 1.6 oz (94.4 kg)    BREAST: No palpable lumps or nodules in the right breast or axilla.  No lumps or nodules in the left chest wall.. (exam performed in the presence of a chaperone)  LABORATORY DATA:  I have reviewed the data as listed CMP Latest Ref Rng & Units 11/07/2019 10/09/2019 07/18/2019  Glucose 70 - 99 mg/dL - 124(H) 110(H)  BUN 4 - 21 23(A) 13 16  Creatinine 0.5 - 1.1 1.2(A) 1.19(H) 1.10  Sodium 137 - 147 137 139 139  Potassium 3.4 - 5.3 4.1 3.5 3.9  Chloride 99 - 108 99 103 101  CO2 13 - '22 22 25 27  ' Calcium 8.7 - 10.7 9.5 9.3 9.6  Total Protein 6.0 - 8.3 g/dL - - 7.1  Total Bilirubin 0.2 - 1.2 mg/dL - - 0.9  Alkaline Phos 25 - 125 196(A) - 198(H)  AST 13 - 35 19 - 18  ALT 7 - 35 15 - 15    Lab Results  Component Value Date   WBC 7.4 10/09/2019   HGB 12.1 10/09/2019   HCT 38.1 10/09/2019   MCV 78.9 (L) 10/09/2019   PLT 274 10/09/2019   NEUTROABS 3.5 07/18/2019    ASSESSMENT & PLAN:  Breast cancer of upper-outer quadrant of right female breast (HCC) Left breast invasive ductal carcinoma with DCIS ER/PR positive HER-2 positive: Status post mastectomy, adjuvant chemotherapy in 1 year of Herceptin. Currently on letrozole adjuvant hormonal therapy started 03/13/13 completed 07/09/2020     Breast cancer surveillance: 1. Breast exam done 07/09/2020: Benign 2. Mammogram   at [facility]   Multiple sclerosis:Causing fatigue and body pains. Patient was offered a treatment for MS but she does not want to take it because one of the Adverse effects was neutropenia.    Anxiety/depression: On Wellbutrin    Meningiomas (2 of them): Patient will need stereotactic radiosurgery.  She is very anxious about it. Return to clinic on an as-needed basis.   No  orders of the defined types were placed in this encounter.  The patient has a good understanding of the overall plan. she agrees with it. she will call with any problems that may develop before the next visit here.  Total time spent: 20 mins including face to face time and time spent for planning, charting and coordination of care  Nicholas Lose, MD 07/09/2020  I, Julie Ross, am acting as scribe for Dr. Nicholas Lose.  I have reviewed the above documentation for accuracy and completeness, and I agree with the above.

## 2020-07-09 ENCOUNTER — Inpatient Hospital Stay: Payer: Commercial Managed Care - PPO | Attending: Hematology and Oncology | Admitting: Hematology and Oncology

## 2020-07-09 ENCOUNTER — Other Ambulatory Visit: Payer: Self-pay | Admitting: Family Medicine

## 2020-07-09 ENCOUNTER — Other Ambulatory Visit: Payer: Self-pay

## 2020-07-09 DIAGNOSIS — Z170 Estrogen receptor positive status [ER+]: Secondary | ICD-10-CM | POA: Insufficient documentation

## 2020-07-09 DIAGNOSIS — Z923 Personal history of irradiation: Secondary | ICD-10-CM | POA: Insufficient documentation

## 2020-07-09 DIAGNOSIS — C50411 Malignant neoplasm of upper-outer quadrant of right female breast: Secondary | ICD-10-CM | POA: Diagnosis present

## 2020-07-09 DIAGNOSIS — Z9012 Acquired absence of left breast and nipple: Secondary | ICD-10-CM | POA: Diagnosis not present

## 2020-07-09 DIAGNOSIS — Z79811 Long term (current) use of aromatase inhibitors: Secondary | ICD-10-CM | POA: Insufficient documentation

## 2020-07-09 NOTE — Assessment & Plan Note (Signed)
Left breast invasive ductal carcinoma with DCIS ER/PR positive HER-2 positive: Status post mastectomy, adjuvant chemotherapy in 1 year of Herceptin. Currently on letrozole adjuvant hormonal therapy started 03/13/13 .   Letrozole toxicities: 1. No major hot flashes or myalgias. We discussed the role of extended adjuvant therapy. She is willing to take antiestrogen treatment for a total of 7 years.  Breast cancer surveillance: 1. Breast exam done 07/09/2020: Benign 2. Mammogram 07/05/2017: Benign breast density category B, patient is getting [facility].  Patient had a bone density at [facility].    Multiple sclerosis:Causing fatigue and body pains. Patient was offered a treatment for MS but she does not want to take it because one of the Adverse effects was neutropenia. She is not sure whether she wants to take that medicine.  Anxiety/depression: On Wellbutrin  Return to clinic in1 yearfor follow-up.

## 2020-07-13 ENCOUNTER — Other Ambulatory Visit: Payer: Self-pay | Admitting: Family Medicine

## 2020-07-14 ENCOUNTER — Encounter: Payer: Self-pay | Admitting: Family Medicine

## 2020-07-14 ENCOUNTER — Other Ambulatory Visit: Payer: Self-pay

## 2020-07-14 ENCOUNTER — Ambulatory Visit (INDEPENDENT_AMBULATORY_CARE_PROVIDER_SITE_OTHER): Payer: Commercial Managed Care - PPO | Admitting: Family Medicine

## 2020-07-14 ENCOUNTER — Telehealth: Payer: Self-pay | Admitting: Hematology and Oncology

## 2020-07-14 VITALS — BP 120/70 | HR 62 | Temp 97.5°F | Resp 20 | Ht 66.0 in | Wt 209.4 lb

## 2020-07-14 DIAGNOSIS — G35 Multiple sclerosis: Secondary | ICD-10-CM | POA: Diagnosis not present

## 2020-07-14 DIAGNOSIS — I10 Essential (primary) hypertension: Secondary | ICD-10-CM | POA: Diagnosis not present

## 2020-07-14 DIAGNOSIS — E669 Obesity, unspecified: Secondary | ICD-10-CM | POA: Diagnosis not present

## 2020-07-14 DIAGNOSIS — Z0000 Encounter for general adult medical examination without abnormal findings: Secondary | ICD-10-CM | POA: Diagnosis not present

## 2020-07-14 LAB — BASIC METABOLIC PANEL
BUN: 16 mg/dL (ref 6–23)
CO2: 23 mEq/L (ref 19–32)
Calcium: 9.6 mg/dL (ref 8.4–10.5)
Chloride: 97 mEq/L (ref 96–112)
Creatinine, Ser: 1.05 mg/dL (ref 0.40–1.20)
GFR: 57.2 mL/min — ABNORMAL LOW (ref >60.00)
Glucose, Bld: 93 mg/dL (ref 70–99)
Potassium: 3.7 mEq/L (ref 3.5–5.1)
Sodium: 131 mEq/L — ABNORMAL LOW (ref 135–145)

## 2020-07-14 LAB — LIPID PANEL
Cholesterol: 136 mg/dL (ref 0–200)
HDL: 61.2 mg/dL (ref >39.00)
LDL Cholesterol: 57 mg/dL (ref 0–99)
NonHDL: 74.9
Total CHOL/HDL Ratio: 2
Triglycerides: 91 mg/dL (ref 0.0–149.0)
VLDL: 18.2 mg/dL (ref 0.0–40.0)

## 2020-07-14 LAB — HEPATIC FUNCTION PANEL
ALT: 14 U/L (ref 0–35)
AST: 26 U/L (ref 0–37)
Albumin: 4.5 g/dL (ref 3.5–5.2)
Alkaline Phosphatase: 155 U/L — ABNORMAL HIGH (ref 39–117)
Bilirubin, Direct: 0.1 mg/dL (ref 0.0–0.3)
Total Bilirubin: 0.9 mg/dL (ref 0.2–1.2)
Total Protein: 7.5 g/dL (ref 6.0–8.3)

## 2020-07-14 LAB — CBC WITH DIFFERENTIAL/PLATELET
Basophils Absolute: 0.1 10*3/uL (ref 0.0–0.1)
Basophils Relative: 0.9 % (ref 0.0–3.0)
Eosinophils Absolute: 0.2 10*3/uL (ref 0.0–0.7)
Eosinophils Relative: 2.3 % (ref 0.0–5.0)
HCT: 37.3 % (ref 36.0–46.0)
Hemoglobin: 12.5 g/dL (ref 12.0–15.0)
Lymphocytes Relative: 29.6 % (ref 12.0–46.0)
Lymphs Abs: 2 10*3/uL (ref 0.7–4.0)
MCHC: 33.4 g/dL (ref 30.0–36.0)
MCV: 79 fl (ref 78.0–100.0)
Monocytes Absolute: 0.5 10*3/uL (ref 0.1–1.0)
Monocytes Relative: 7.3 % (ref 3.0–12.0)
Neutro Abs: 4 10*3/uL (ref 1.4–7.7)
Neutrophils Relative %: 59.9 % (ref 43.0–77.0)
Platelets: 215 10*3/uL (ref 150.0–400.0)
RBC: 4.72 Mil/uL (ref 3.87–5.11)
RDW: 14.1 % (ref 11.5–15.5)
WBC: 6.7 10*3/uL (ref 4.0–10.5)

## 2020-07-14 LAB — TSH: TSH: 2.46 u[IU]/mL (ref 0.35–4.50)

## 2020-07-14 NOTE — Progress Notes (Signed)
   Subjective:    Patient ID: Julie Ross, female    DOB: Sep 23, 1960, 60 y.o.   MRN: [id number]  HPI CPE- pt has lost 25 lbs since last visit!  UTD on colonoscopy, mammo, Tdap, flu, COVID.  Reviewed past medical, surgical, family and social histories.   Patient Care Team    Relationship Specialty Notifications Start End  Midge Minium, MD PCP - General Family Medicine  07/31/19   Juanda Chance, NP Nurse Practitioner Obstetrics and Gynecology  12/17/14   Gatha Mayer, MD Consulting Physician Gastroenterology  12/17/14   Frederik Pear, MD Consulting Physician Orthopedic Surgery  06/27/17   Lavonna Monarch, MD Consulting Physician Dermatology  04/07/20   Britt Bottom, MD  Neurology  07/14/20      Health Maintenance  Topic Date Due  . INFLUENZA VACCINE  01/19/2020  . PAP SMEAR-Modifier  07/14/2020 (Originally 03/01/2019)  . HIV Screening  07/17/2020 (Originally 08/29/1973)  . COVID-19 Vaccine (4 - Booster) 10/13/2020  . MAMMOGRAM  11/03/2021  . COLONOSCOPY (Pts 45-86yrs Insurance coverage will need to be confirmed)  02/24/2025  . TETANUS/TDAP  08/20/2028  . Hepatitis C Screening  Completed      Review of Systems Patient reports no hearing changes, adenopathy,fever, persistant/recurrent hoarseness , swallowing issues, chest pain, palpitations, edema, persistant/recurrent cough, hemoptysis, dyspnea (rest/exertional/paroxysmal nocturnal), gastrointestinal bleeding (melena, rectal bleeding), abdominal pain, significant heartburn, bowel changes, GU symptoms (dysuria, hematuria, incontinence), Gyn symptoms (abnormal  bleeding, pain),  syncope, focal weakness, memory loss, numbness & tingling, skin/hair/nail changes, abnormal bruising or bleeding, anxiety, or depression.   + vision changes- related to meningiomas  This visit occurred during the SARS-CoV-2 public health emergency.  Safety protocols were in place, including screening questions prior to the visit, additional usage of staff  PPE, and extensive cleaning of exam room while observing appropriate contact time as indicated for disinfecting solutions.       Objective:   Physical Exam General Appearance:    Alert, cooperative, no distress, appears stated age  Head:    Normocephalic, without obvious abnormality, atraumatic  Eyes:    PERRL, conjunctiva/corneas clear, EOM's intact, fundi    benign, both eyes  Ears:    Normal TM's and external ear canals, both ears  Nose:   Deferred due to COVID  Throat:   Neck:   Supple, symmetrical, trachea midline, no adenopathy;    Thyroid: no enlargement/tenderness/nodules  Back:     Symmetric, no curvature, ROM normal, no CVA tenderness  Lungs:     Clear to auscultation bilaterally, respirations unlabored  Chest Wall:    No tenderness or deformity   Heart:    Regular rate and rhythm, S1 and S2 normal, no murmur, rub   or gallop  Breast Exam:    Deferred to GYN  Abdomen:     Soft, non-tender, bowel sounds active all four quadrants,    no masses, no organomegaly  Genitalia:    Deferred to GYN  Rectal:    Extremities:   Extremities normal, atraumatic, no cyanosis or edema  Pulses:   2+ and symmetric all extremities  Skin:   Skin color, texture, turgor normal, no rashes or lesions  Lymph nodes:   Cervical, supraclavicular, and axillary nodes normal  Neurologic:   CNII-XII intact, normal strength, sensation and reflexes    throughout          Assessment & Plan:

## 2020-07-14 NOTE — Assessment & Plan Note (Signed)
Pt has lost 25 lbs since last visit!  She is no longer morbidly obese!  Applauded her efforts at healthy diet.  Will continue to follow.

## 2020-07-14 NOTE — Assessment & Plan Note (Signed)
Chronic problem, following w/ Dr Felecia Shelling

## 2020-07-14 NOTE — Assessment & Plan Note (Signed)
Chronic problem.  Well controlled.  Check labs.  No anticipated med changes. 

## 2020-07-14 NOTE — Patient Instructions (Addendum)
Follow up in 6 months to recheck BP and cholesterol We'll notify you of your lab results and make any changes if needed Continue to work on healthy diet and regular exercise- you look great!!! If and when you want to think about reconstruction- Dr Iran Planas Call with any questions or concerns Stay Safe!  Stay Healthy!

## 2020-07-14 NOTE — Assessment & Plan Note (Signed)
Pt's PE WNL w/ exception of obesity (which she is working on).  UTD on pap, mammo, colonoscopy, immunizations.  Check labs.  Anticipatory guidance provided.

## 2020-07-14 NOTE — Telephone Encounter (Signed)
Per 1/20 los, no changes made to pt schedule  

## 2020-08-18 DIAGNOSIS — D329 Benign neoplasm of meninges, unspecified: Secondary | ICD-10-CM

## 2020-08-18 HISTORY — DX: Benign neoplasm of meninges, unspecified: D32.9

## 2020-08-21 ENCOUNTER — Other Ambulatory Visit: Payer: Self-pay | Admitting: Family Medicine

## 2020-08-21 MED ORDER — CETIRIZINE HCL 10 MG PO TABS: 10.0000 mg | ORAL_TABLET | Freq: Every day | ORAL | 3 refills | Status: DC

## 2020-08-25 ENCOUNTER — Other Ambulatory Visit: Payer: Self-pay

## 2020-08-25 ENCOUNTER — Encounter: Payer: Self-pay | Admitting: Family Medicine

## 2020-08-25 DIAGNOSIS — E785 Hyperlipidemia, unspecified: Secondary | ICD-10-CM

## 2020-08-25 DIAGNOSIS — I10 Essential (primary) hypertension: Secondary | ICD-10-CM

## 2020-08-25 MED ORDER — ATORVASTATIN CALCIUM 20 MG PO TABS: 20.0000 mg | ORAL_TABLET | Freq: Every day | ORAL | 0 refills | Status: DC

## 2020-08-25 MED ORDER — LOSARTAN POTASSIUM-HCTZ 100-25 MG PO TABS: ORAL_TABLET | ORAL | 0 refills | Status: DC

## 2020-09-11 ENCOUNTER — Other Ambulatory Visit: Payer: Self-pay | Admitting: Radiation Therapy

## 2020-09-11 ENCOUNTER — Encounter: Payer: Self-pay | Admitting: Genetic Counselor

## 2020-09-11 DIAGNOSIS — Z1379 Encounter for other screening for genetic and chromosomal anomalies: Secondary | ICD-10-CM | POA: Insufficient documentation

## 2020-09-11 DIAGNOSIS — D329 Benign neoplasm of meninges, unspecified: Secondary | ICD-10-CM

## 2020-09-14 ENCOUNTER — Other Ambulatory Visit: Payer: Self-pay | Admitting: Radiation Therapy

## 2020-09-14 ENCOUNTER — Other Ambulatory Visit: Payer: Self-pay | Admitting: Radiation Oncology

## 2020-09-14 ENCOUNTER — Inpatient Hospital Stay: Payer: Commercial Managed Care - PPO | Attending: Hematology and Oncology

## 2020-09-14 DIAGNOSIS — D329 Benign neoplasm of meninges, unspecified: Secondary | ICD-10-CM

## 2020-09-30 ENCOUNTER — Other Ambulatory Visit: Payer: Self-pay

## 2020-09-30 ENCOUNTER — Ambulatory Visit
Admission: RE | Admit: 2020-09-30 | Discharge: 2020-09-30 | Disposition: A | Discharge status: Home / Self Care | Payer: Commercial Managed Care - PPO | Source: Ambulatory Visit | Attending: Radiation Oncology | Admitting: Radiation Oncology

## 2020-09-30 DIAGNOSIS — D320 Benign neoplasm of cerebral meninges: Secondary | ICD-10-CM | POA: Diagnosis present

## 2020-09-30 DIAGNOSIS — Z510 Encounter for antineoplastic radiation therapy: Secondary | ICD-10-CM | POA: Insufficient documentation

## 2020-09-30 DIAGNOSIS — D329 Benign neoplasm of meninges, unspecified: Secondary | ICD-10-CM

## 2020-09-30 LAB — BUN & CREATININE (CHCC)
BUN: 20 mg/dL (ref 8–23)
Creatinine: 1.36 mg/dL — ABNORMAL HIGH (ref 0.44–1.00)
GFR, Estimated: 44 mL/min — ABNORMAL LOW (ref >60)

## 2020-10-01 ENCOUNTER — Ambulatory Visit (HOSPITAL_COMMUNITY)
Admission: RE | Admit: 2020-10-01 | Discharge: 2020-10-01 | Disposition: A | Discharge status: Home / Self Care | Payer: Commercial Managed Care - PPO | Source: Ambulatory Visit | Attending: Radiation Oncology | Admitting: Radiation Oncology

## 2020-10-01 DIAGNOSIS — D329 Benign neoplasm of meninges, unspecified: Secondary | ICD-10-CM | POA: Insufficient documentation

## 2020-10-01 MED ORDER — GADOBUTROL 1 MMOL/ML IV SOLN
9.5000 mL | Freq: Once | INTRAVENOUS | Status: AC | PRN
  Administered 2020-10-01: 9.5 mL via INTRAVENOUS

## 2020-10-05 NOTE — Progress Notes (Signed)
Has armband been applied?  Yes  Does patient have an allergy to IV contrast dye?: No   Has patient ever received premedication for IV contrast dye?: n/a  Does patient take metformin?: No  If patient does take metformin when was the last dose: n/a  Date of lab work: 09/30/2020 BUN: 20 CR: 1.36 eGfr: 44   IV site: Right Forearm  Has IV site been added to flowsheet?  Yes  LMP 12/13/2011    Patient will receive reduced dose contrast.  Khai Arrona M. Leonie Green, BSN

## 2020-10-05 NOTE — Progress Notes (Signed)
Location/Histology of Brain Tumor: Left Sphenoid Wing, Left Tentorial Membrane  Patient presented for observational scans for 2 areas in her brain.    MRI Brain 10/01/2020: 14 x 15 mm sphenoid wing meningioma left is unchanged.  7 mm meningioma left tentorium unchanged.  Pineal cyst unchanged.  Bilateral white matter lesions are stable. Differential includes chronic ischemia and demyelinating disease. Some lesions are suggestive of multiple sclerosis.  MRI Brain 06/16/2020 ([facility]): Interval enlargement of the presumed meningioma along the left lesser sphenoid wing, just lateral to the left anterior clinoid process.  It now measures approximately 16 mm AP x16 mm transverse x12 mm craniocaudal (previously approximately 10 x 10 x 9 mm).  Interval mild enlargement of the presumed meningioma along the undersurface of the anteromedial left tentorium cerebelli.  It now measures approximately 9 mm (previously approximately 7 mm).  It abuts the cisternal segment of the left trigeminal nerve in the left lateral upper pons.  Findings consistent with the stated clinical diagnosis of multiple sclerosis.  No definitive new lesions.  No findings to indicate active demyelination.  Past or anticipated interventions, if any, per neurosurgery:  Dr. Zada Finders 09/10/2020 -Incidental finding of meningioma x2.  We previously discussed SRS and due to spike in Covid infections, decided to hold off for a while while things cool down and have thankfully now done so. -Recommend SRS, will refer to [place] for evaluation.    Past or anticipated interventions, if any, per medical oncology:   Dose of Decadron, if applicable: None  Recent neurologic symptoms, if any:   Seizures: No  Headaches: Mild  Nausea: No  Dizziness/ataxia: No  Difficulty with hand coordination: No  Focal numbness/weakness: Baseline due to MS  Visual deficits/changes: Baseline due to MS  Confusion/Memory deficits: Has mild memory loss due to  baseline MS    SAFETY ISSUES: Prior radiation? Left chest wall, axilla, left supraclav. [id number] cGy,Left mastectomy scar/chest wall 5940 cGy 2013 with Dr. Sondra Come  Pacemaker/ICD? No  Possible current pregnancy? Postmenopausal  Is the patient on methotrexate? No  Additional Complaints / other details:

## 2020-10-06 ENCOUNTER — Encounter: Payer: Self-pay | Admitting: Radiation Oncology

## 2020-10-06 ENCOUNTER — Ambulatory Visit
Admission: RE | Admit: 2020-10-06 | Discharge: 2020-10-06 | Disposition: A | Discharge status: Home / Self Care | Payer: Commercial Managed Care - PPO | Source: Ambulatory Visit | Attending: Radiation Oncology | Admitting: Radiation Oncology

## 2020-10-06 ENCOUNTER — Other Ambulatory Visit: Payer: Self-pay

## 2020-10-06 ENCOUNTER — Other Ambulatory Visit: Payer: Self-pay | Admitting: Radiation Therapy

## 2020-10-06 VITALS — BP 129/49 | HR 67 | Temp 97.8°F | Resp 20 | Ht 66.0 in | Wt 208.4 lb

## 2020-10-06 DIAGNOSIS — D329 Benign neoplasm of meninges, unspecified: Secondary | ICD-10-CM

## 2020-10-06 DIAGNOSIS — Z79899 Other long term (current) drug therapy: Secondary | ICD-10-CM | POA: Insufficient documentation

## 2020-10-06 DIAGNOSIS — M129 Arthropathy, unspecified: Secondary | ICD-10-CM | POA: Diagnosis not present

## 2020-10-06 DIAGNOSIS — C50411 Malignant neoplasm of upper-outer quadrant of right female breast: Secondary | ICD-10-CM | POA: Insufficient documentation

## 2020-10-06 DIAGNOSIS — I10 Essential (primary) hypertension: Secondary | ICD-10-CM | POA: Diagnosis not present

## 2020-10-06 DIAGNOSIS — Z510 Encounter for antineoplastic radiation therapy: Secondary | ICD-10-CM | POA: Diagnosis not present

## 2020-10-06 DIAGNOSIS — Z85828 Personal history of other malignant neoplasm of skin: Secondary | ICD-10-CM | POA: Diagnosis not present

## 2020-10-06 DIAGNOSIS — D320 Benign neoplasm of cerebral meninges: Secondary | ICD-10-CM | POA: Insufficient documentation

## 2020-10-06 DIAGNOSIS — G35 Multiple sclerosis: Secondary | ICD-10-CM | POA: Diagnosis not present

## 2020-10-06 DIAGNOSIS — Z170 Estrogen receptor positive status [ER+]: Secondary | ICD-10-CM | POA: Diagnosis not present

## 2020-10-06 DIAGNOSIS — K219 Gastro-esophageal reflux disease without esophagitis: Secondary | ICD-10-CM | POA: Insufficient documentation

## 2020-10-06 DIAGNOSIS — Z923 Personal history of irradiation: Secondary | ICD-10-CM | POA: Insufficient documentation

## 2020-10-06 DIAGNOSIS — G4730 Sleep apnea, unspecified: Secondary | ICD-10-CM | POA: Diagnosis not present

## 2020-10-06 DIAGNOSIS — E039 Hypothyroidism, unspecified: Secondary | ICD-10-CM | POA: Insufficient documentation

## 2020-10-06 DIAGNOSIS — Z803 Family history of malignant neoplasm of breast: Secondary | ICD-10-CM | POA: Diagnosis not present

## 2020-10-06 DIAGNOSIS — Z9221 Personal history of antineoplastic chemotherapy: Secondary | ICD-10-CM | POA: Insufficient documentation

## 2020-10-06 DIAGNOSIS — Z801 Family history of malignant neoplasm of trachea, bronchus and lung: Secondary | ICD-10-CM | POA: Diagnosis not present

## 2020-10-06 MED ORDER — SODIUM CHLORIDE 0.9% FLUSH
10.0000 mL | Freq: Once | INTRAVENOUS | Status: AC
  Administered 2020-10-06: 10 mL via INTRAVENOUS

## 2020-10-06 NOTE — Progress Notes (Signed)
Radiation Oncology         [phone] ________________________________  Name: Julie Ross        MRN: [id number]  Date of Service: 10/06/2020 DOB: [birth year]  [id number], Julie Millet, MD  Judith Part, MD     REFERRING PHYSICIAN: Judith Part, MD   DIAGNOSIS: The primary encounter diagnosis was Malignant neoplasm of upper-outer quadrant of right breast in female, estrogen receptor positive ([place]). Diagnoses of Meningioma ([place]) and Multiple sclerosis ([place]) were also pertinent to this visit.   HISTORY OF PRESENT ILLNESS: Julie Ross is a 60 y.o. female seen at the request of Dr. Zada Finders for diagnosis of meningioma.  The patient has a remote history of stage II left breast cancer, her surgery in July 2013 with mastectomy showed 2 foci of invasive ductal carcinoma that were ER/PR positive, one lymph node was positive as well and she received adjuvant chemotherapy and adjuvant radiotherapy between 04/17/12-06/04/12 under the care of Dr. Sondra Come.  She has been followed in surveillance since with Dr. Lindi Adie.  She has a history of multiple sclerosis and had memory changes so she had an MRI scan of the brain in April 2015 that showed a 1 cm pineal cyst that had been stable in comparison as well as no evidence of intracranial abnormality or mass.  Given progressive symptoms of fatigue her neurologist Dr. Felecia Shelling ordered an MRI in 2017 that showed hyperintense foci in multiple sites of the brain that were considered nonacute, stability of the pineal cyst and no other enhancement.  This was repeated at an outside facility and there was felt to be a 1 cm enhancing dural based extra-axial mass along the left lesser sphenoid wing just lateral to the left anterior clinoid process with a mild dural tail and a 7 mm enhancing dural based extra-axial mass along the undersurface of the anterior mass medial left tentorium.  Repeat imaging in 2019 showed stability in these 2 sites that were felt to be  consistent with meningioma and in 2020 the patient was evaluated by Dr. Zada Finders and underwent lumbar fusion and laminectomy. Recent MRI in December 2021 at [facility] showed and increase in the left sphenoid wing site measuring 16 mm, and enlargement in the left tentorium lesion now 9 mm.  In reviewing her case, it was felt that she might benefit from stereotactic radiosurgery.  She underwent 3T imaging on 10/01/2020 which revealed a 14 x 15 mm sphenoid wing meningioma that was unchanged. The meningioma in the left tentorium was 7 mm and also unchanged. She does have a history of MS and she has bilateral white matter lesions that were stable.  Her case was discussed in multidisciplinary brain and spine oncology conference and it was recommended that she undergo stereotactic radiosurgery and she's seen today to coordinate next steps regarding treatment.     PREVIOUS RADIATION THERAPY: Yes   04/17/12-06/04/12 The patient received left postmastectomy radiotherapy under the care of Dr. Sondra Come. It appears that she received 45 Gy in 25 fractions with a 14.4 Gy boost over 8 fractions.   PAST MEDICAL HISTORY:  Past Medical History:  Diagnosis Date  . Anemia    h/o - ? bld. transfusion - more recent- ?2013  . Anxiety    uses xanax mainly for sleep  . Arthritis    knees   . Basal cell carcinoma 01/11/2005   rim left nostril-,mohs  . BCC (basal cell carcinoma) 01/11/2005   right cheek mid-mohs  . BCC (basal cell  carcinoma) 09/12/2006   right forehead -mohs  . BCC (basal cell carcinoma) 01/19/2016   right upper arm -[id number]  . BCC (basal cell carcinoma) 02/18/2019   forehead-mohs  . Breast cancer ([place]) 11/2011   lul/ER+PR+, x1 lymph node   . Cancer (H[facility])    squamous cell skin cancer x7  . Depression   . GERD (gastroesophageal reflux disease)   . H/O bone density study 03/2011  . H/O colonoscopy   . H/O echocardiogram    last one 02/2013, due to chemotherapy  . Heart burn   . Hx of radiation  therapy 04/17/12- 06/04/12   left chest wall, axilla, L supraclavicular fossa 4500 cGy, left mastectomy scar/chest wall 5940 cGy  . Hypertension   . Hypothyroidism   . Meningioma ([place]) 08/2020  . Multiple sclerosis ([place])   . Neuromuscular disorder ([place])    MS TX WITH CYMBALTA   . Personal history of chemotherapy   . Personal history of radiation therapy   . SCC (squamous cell carcinoma) 01/11/2005   chest mid  . Sleep apnea    MILD SLEEP APNEA, NO (Needed) MACHINE  6 YRS AGO HPT [facility]   . Squamous cell carcinoma of skin 01/11/2005   over left lip  . Wears glasses        PAST SURGICAL HISTORY: Past Surgical History:  Procedure Laterality Date  . APPLICATION OF ROBOTIC ASSISTANCE FOR SPINAL PROCEDURE N/A 03/12/2019   Procedure: APPLICATION OF ROBOTIC ASSISTANCE FOR SPINAL PROCEDURE;  Surgeon: Judith Part, MD;  Location: [facility];  Service: Neurosurgery;  Laterality: N/A;  . CESAREAN SECTION     X2   . COLONOSCOPY    . ESOPHAGOGASTRODUODENOSCOPY    . FLEXIBLE SIGMOIDOSCOPY    . I & D KNEE WITH POLY EXCHANGE  03/02/2012   Procedure: IRRIGATION AND DEBRIDEMENT KNEE WITH POLY EXCHANGE;  Surgeon: Alta Corning, MD;  Location: [facility];  Service: Orthopedics;  Laterality: Left;  I&D of total knee with Possible Poly Exchange   . JOINT REPLACEMENT  Feb 2013   left knee- multiple surgeries   . KNEE ARTHROSCOPY     LT KNEE 04/2011  . KNEE ARTHROSCOPY  84   rt  . MASTECTOMY    . MASTECTOMY W/ SENTINEL NODE BIOPSY  12/19/2011   Procedure: MASTECTOMY WITH SENTINEL LYMPH NODE BIOPSY;  Surgeon: Haywood Lasso, MD;  Location: [facility];  Service: General;  Laterality: Left;  left breast and left axilla   . MOHS SURGERY     right face  . NO PAST SURGERIES     BLADDER SLING  4-5 YRS AGO   . PORT-A-CATH REMOVAL  03/06/2012   Procedure: REMOVAL PORT-A-CATH;  Surgeon: Odis Hollingshead, MD;  Location: [facility];  Service: General;  Laterality: N/A;  . PORT-A-CATH REMOVAL Right 08/22/2012    Procedure: REMOVAL PORT-A-CATH;  Surgeon: Edward Jolly, MD;  Location: [place];  Service: General;  Laterality: Right;  . PORTACATH PLACEMENT  01/16/2012   Procedure: INSERTION PORT-A-CATH;  Surgeon: Haywood Lasso, MD;  Location: [facility];  Service: General;  Laterality: Right;  [place] Cath Placement   . PORTACATH PLACEMENT  05/01/2012   Procedure: INSERTION PORT-A-CATH;  Surgeon: Haywood Lasso, MD;  Location: [place];  Service: General;  Laterality: Right;  right internal jugular port-a-cath insertion  . TEE WITHOUT CARDIOVERSION  03/06/2012   Procedure: TRANSESOPHAGEAL ECHOCARDIOGRAM (TEE);  Surgeon: Josue Hector, MD;  Location: [facility];  Service: Cardiovascular;  Laterality: N/A;  Rm. [id number]  . TONSILLECTOMY    . TOTAL KNEE ARTHROPLASTY  08/15/2011   Procedure: TOTAL KNEE ARTHROPLASTY; lft Surgeon: Alta Corning, MD;  Location: [place];  Service: Orthopedics;  Laterality: Left;  RIGHT KNEE CORTIZONE INJECTION  . TOTAL KNEE ARTHROPLASTY Left 08/18/2012   Procedure:  Irrigation and debridement of LEFT total knee;  Removal of total knee parts; Implant of spacers;  Surgeon: Kerin Salen, MD;  Location: [facility];  Service: Orthopedics;  Laterality: Left;  . TOTAL KNEE ARTHROPLASTY Right 08/12/2013  . TOTAL KNEE ARTHROPLASTY Right 08/12/2013   Procedure: RIGHT TOTAL KNEE ARTHROPLASTY;  Surgeon: Kerin Salen, MD;  Location: [facility];  Service: Orthopedics;  Laterality: Right;  . TOTAL KNEE REVISION Left 04/19/2013   Procedure: TOTAL KNEE REVISION AND REMOVAL CEMENT SPACER;  Surgeon: Kerin Salen, MD;  Location: [facility];  Service: Orthopedics;  Laterality: Left;  . TRANSFORAMINAL LUMBAR INTERBODY FUSION (TLIF) WITH PEDICLE SCREW FIXATION 1 LEVEL Bilateral 03/12/2019   Procedure: Lumbar four-five Open transforaminal lumbar interbody fusion with Lumbar four-five laminectomy, bilateral Lumbar four-five Pedicle screw placement;  Surgeon: Judith Part, MD;   Location: [facility];  Service: Neurosurgery;  Laterality: Bilateral;  . TUMOR REMOVAL     FROM PELVIS AGE 27     FAMILY HISTORY:  Family History  Problem Relation Age of Onset  . Breast cancer Paternal Aunt        dx in her 19s  . Heart disease Maternal Grandfather   . Lung cancer Maternal Uncle        smoker  . Breast cancer Paternal Grandmother        dx in her 61s  . Ovarian cancer Paternal Aunt        dx in her 57s  . Arthritis Mother   . Hypertension Mother   . Heart disease Mother        CHF  . Dementia Father   . Diabetes type II Father      SOCIAL HISTORY:  reports that she has never smoked. She has never used smokeless tobacco. She reports that she does not drink alcohol and does not use drugs. The patient is married and accompanied by her husband.  She lives in [place].  She is on disability.   ALLERGIES: Bee venom   MEDICATIONS:  Current Outpatient Medications  Medication Sig Dispense Refill  . ALPRAZolam (XANAX) 0.5 MG tablet TAKE 1 TABLET(0.5 MG) BY MOUTH THREE TIMES DAILY AS NEEDED FOR ANXIETY 90 tablet 5  . Ascorbic Acid (VITAMIN C PO) Take by mouth.    Marland Kitchen atorvastatin (LIPITOR) 20 MG tablet Take 1 tablet (20 mg total) by mouth daily. 90 tablet 0  . baclofen (LIORESAL) 10 MG tablet TAKE 1 TABLET THREE TIMES A DAY AS NEEDED FOR MUSCLE SPASMS 270 tablet 3  . buPROPion (WELLBUTRIN XL) 150 MG 24 hr tablet TAKE 1 TABLET DAILY 90 tablet 3  . cetirizine (ZYRTEC) 10 MG tablet Take 1 tablet (10 mg total) by mouth daily. 90 tablet 3  . Cholecalciferol (VITAMIN D3 PO) Take by mouth.    . Cyanocobalamin (VITAMIN B-12 PO) Take by mouth.    . EPINEPHrine 0.3 mg/0.3 mL IJ SOAJ injection Inject 0.3 mLs (0.3 mg total) into the muscle as needed for anaphylaxis. 1 each 1  . fluticasone (FLONASE) 50 MCG/ACT nasal spray Place 2 sprays into both nostrils daily. 16 g 6  . gabapentin (NEURONTIN) 600 MG tablet Take 1 tablet (600 mg  total) by mouth 3 (three) times daily. 270 tablet 4   . HYDROcodone-acetaminophen (NORCO/VICODIN) 5-325 MG tablet Take 1 tablet by mouth 2 (two) times daily as needed for pain.    Marland Kitchen KLOR-CON M20 20 MEQ tablet TAKE 1 TABLET DAILY 90 tablet 3  . levothyroxine (SYNTHROID) 88 MCG tablet TAKE 1 TABLET DAILY BEFORE BREAKFAST 90 tablet 3  . losartan-hydrochlorothiazide (HYZAAR) 100-25 MG tablet TAKE 1 TABLET EVERY MORNING BEFORE BREAKFAST 90 tablet 0  . omeprazole (PRILOSEC) 40 MG capsule TAKE 1 CAPSULE DAILY 90 capsule 3  . ondansetron (ZOFRAN) 4 MG tablet Take 1 tablet (4 mg total) by mouth every 8 (eight) hours as needed for nausea or vomiting. 20 tablet 0  . vitamin E 400 UNIT capsule Take 800 Units by mouth daily.     No current facility-administered medications for this encounter.   Facility-Administered Medications Ordered in Other Encounters  Medication Dose Route Frequency Provider Last Rate Last Admin  . sodium chloride flush (NS) 0.9 % injection 10 mL  10 mL Intravenous Once Hayden Pedro, PA-C         REVIEW OF SYSTEMS: On review of systems, the patient reports that she is doing fairly well overall.  She is nervous about today's appointment and treatment regarding claustrophobia.  She does report some chronic changes related to short-term memory as a result of her MS, she does have focal numbness and weakness at times as well as visual changes also due to her MS.  She has occasional mild headaches, and denies dizziness, nausea, or movement.  No other complaints or verbalized     PHYSICAL EXAM:  Wt Readings from Last 3 Encounters:  10/06/20 208 lb 6.4 oz (94.5 kg)  07/14/20 209 lb 6.4 oz (95 kg)  07/09/20 208 lb 1.6 oz (94.4 kg)   Temp Readings from Last 3 Encounters:  10/06/20 97.8 F (36.6 C)  07/14/20 (!) 97.5 F (36.4 C) (Temporal)  07/09/20 97.9 F (36.6 C) (Temporal)   BP Readings from Last 3 Encounters:  10/06/20 (!) 129/49  07/14/20 120/70  07/09/20 (!) 122/51   Pulse Readings from Last 3 Encounters:   10/06/20 67  07/14/20 62  07/09/20 73   Pain Assessment Pain Score: 2  (Mild headache)/10  In general this is a well appearing caucasian female in no acute distress. She's alert and oriented x4 and appropriate throughout the examination. Cardiopulmonary assessment is negative for acute distress and she exhibits normal effort.     ECOG = 1  0 - Asymptomatic (Fully active, able to carry on all predisease activities without restriction)  1 - Symptomatic but completely ambulatory (Restricted in physically strenuous activity but ambulatory and able to carry out work of a light or sedentary nature. For example, light housework, office work)  2 - Symptomatic, <50% in bed during the day (Ambulatory and capable of all self care but unable to carry out any work activities. Up and about more than 50% of waking hours)  3 - Symptomatic, >50% in bed, but not bedbound (Capable of only limited self-care, confined to bed or chair 50% or more of waking hours)  4 - Bedbound (Completely disabled. Cannot carry on any self-care. Totally confined to bed or chair)  5 - Death   Eustace Pen MM, Creech RH, Tormey DC, et al. [phone]). "Toxicity and response criteria of the [facility]". [place] Oncol. 5 (6): 649-55    LABORATORY DATA:  Lab Results  Component Value Date  WBC 6.7 07/14/2020   HGB 12.5 07/14/2020   HCT 37.3 07/14/2020   MCV 79.0 07/14/2020   PLT 215.0 07/14/2020   Lab Results  Component Value Date   NA 131 (L) 07/14/2020   K 3.7 07/14/2020   CL 97 07/14/2020   CO2 23 07/14/2020   Lab Results  Component Value Date   ALT 14 07/14/2020   AST 26 07/14/2020   ALKPHOS 155 (H) 07/14/2020   BILITOT 0.9 07/14/2020      RADIOGRAPHY: MR Brain W Wo Contrast  Result Date: 10/02/2020 CLINICAL DATA:  Treatment planning for meningioma. EXAM: MRI HEAD WITHOUT AND WITH CONTRAST TECHNIQUE: Multiplanar, multiecho pulse sequences of the brain and surrounding structures were  obtained without and with intravenous contrast. CONTRAST:  9.30mL GADAVIST GADOBUTROL 1 MMOL/ML IV SOLN COMPARISON:  MRI head 06/16/2020 FINDINGS: Brain: Enhancing extra-axial mass along the sphenoid wing projecting superiorly measures approximately 14 x 15 mm unchanged. Findings compatible with meningioma Second dural-based enhancing mass along the left tentorium measures 7 mm compatible with meningioma. This also is unchanged Ventricle size is normal. No midline shift. Negative for acute infarct or hemorrhage. Periventricular deep white matter hyperintensities are unchanged from the prior study. 10 mm pineal cyst unchanged from the prior study. Vascular: Normal arterial flow voids Skull and upper cervical spine: No focal skeletal lesion. Sinuses/Orbits: Negative Other: None IMPRESSION: 14 x 15 mm sphenoid wing meningioma left is unchanged. 7 mm meningioma left tentorium unchanged Pineal cyst unchanged Bilateral white matter lesions are stable. Differential includes chronic ischemia and demyelinating disease. Some lesions are suggestive of multiple sclerosis. Electronically Signed   By: Franchot Gallo M.D.   On: 10/02/2020 16:11       IMPRESSION/PLAN: 1. Multifocal Meningioma. Dr. Lisbeth Renshaw discusses the patient's course and imaging findings to date. He recommends the role of radiotherapy with curative intent to halt progressive growth of her meningioma with stereotactic radiosurgery [facility]).  We discussed the risks, benefits, short, and long term effects of radiotherapy, as well as the curative intent, and the patient is interested in proceeding. Dr. Lisbeth Renshaw discusses the delivery and logistics of radiotherapy and anticipates a course of 1-3 fractions of SRS treatment. Written consent is obtained and placed in the chart, a copy was provided to the patient. She will proceed with simulation this afternoon. Her treatment is scheduled for 10/13/20 for single fraction treatment but may be extended depending on treatment  planning/dosimetry.  In a visit lasting 60 minutes, greater than 50% of the time was spent face to face discussing the patient's condition, in preparation for the discussion, and coordinating the patient's care.   The above documentation reflects my direct findings during this shared patient visit. Please see the separate note by Dr. Lisbeth Renshaw on this date for the remainder of the patient's plan of care.    Carola Rhine, [facility]   **Disclaimer: This note was dictated with voice recognition software. Similar sounding words can inadvertently be transcribed and this note may contain transcription errors which may not have been corrected upon publication of note.**

## 2020-10-08 ENCOUNTER — Encounter: Payer: Self-pay | Admitting: Family Medicine

## 2020-10-09 ENCOUNTER — Other Ambulatory Visit: Payer: Self-pay

## 2020-10-09 DIAGNOSIS — Z9010 Acquired absence of unspecified breast and nipple: Secondary | ICD-10-CM

## 2020-10-13 ENCOUNTER — Ambulatory Visit: Payer: Commercial Managed Care - PPO | Admitting: Radiation Oncology

## 2020-10-13 DIAGNOSIS — Z510 Encounter for antineoplastic radiation therapy: Secondary | ICD-10-CM | POA: Diagnosis not present

## 2020-10-15 ENCOUNTER — Encounter: Payer: Self-pay | Admitting: Radiation Oncology

## 2020-10-15 ENCOUNTER — Other Ambulatory Visit: Payer: Self-pay

## 2020-10-15 ENCOUNTER — Ambulatory Visit
Admission: RE | Admit: 2020-10-15 | Discharge: 2020-10-15 | Disposition: A | Discharge status: Home / Self Care | Payer: Commercial Managed Care - PPO | Source: Ambulatory Visit | Attending: Radiation Oncology | Admitting: Radiation Oncology

## 2020-10-15 DIAGNOSIS — Z510 Encounter for antineoplastic radiation therapy: Secondary | ICD-10-CM | POA: Diagnosis not present

## 2020-10-15 NOTE — Progress Notes (Signed)
  Name: Julie Ross  MRN: [id number]  Date: 10/15/2020   DOB: Sep 27, 1960  Stereotactic Radiosurgery Operative Note  PRE-OPERATIVE DIAGNOSIS:  Intracranial meningiomas  POST-OPERATIVE DIAGNOSIS:  Same  PROCEDURE:  Stereotactic Radiosurgery  SURGEON:  Judith Part, MD  NARRATIVE: The patient underwent a radiation treatment planning session in the radiation oncology simulation suite under the care of the radiation oncology physician and physicist.  I participated closely in the radiation treatment planning afterwards. The patient underwent planning CT which was fused to 3T high resolution MRI with 1 mm axial slices.  These images were fused on the planning system.  We contoured the gross target volumes and subsequently expanded this to yield the Planning Target Volume. I actively participated in the planning process.  I helped to define and review the target contours and also the contours of the optic pathway, eyes, brainstem and selected nearby organs at risk.  All the dose constraints for critical structures were reviewed and compared to AAPM Task Group 101.  The prescription dose conformity was reviewed.  I approved the plan electronically.    Accordingly, [name] was brought to the TrueBeam stereotactic radiation treatment linac and placed in the custom immobilization mask.  The patient was aligned according to the IR fiducial markers with BrainLab Exactrac, then orthogonal x-rays were used in ExacTrac with the 6DOF robotic table and the shifts were made to align the patient  [name] received stereotactic radiosurgery uneventfully.    Lesions treated:  2   Complex lesions treated:  1 (>3.5 cm, <82mm of optic path, or within the brainstem)   The detailed description of the procedure is recorded in the radiation oncology procedure note.  I was present for the duration of the procedure.  DISPOSITION:  Following delivery, the patient was transported to nursing in stable  condition and monitored for possible acute effects to be discharged to home in stable condition with follow-up in one month.  Judith Part, MD 10/15/2020 12:17 PM

## 2020-10-15 NOTE — Progress Notes (Signed)
Mrs. Bolte rested with [place] for 30 minutes following her [place] treatment.  Patient denies headache, dizziness, nausea, diplopia or ringing in the ears. Denies fatigue. Patient without complaints. Understands to avoid strenuous activity for the next 24 hours and call [phone] with needs.   BP 117/61   Pulse 62   Temp (!) 96.9 F (36.1 C)   Resp 20   LMP 12/13/2011   SpO2 98%

## 2020-10-20 ENCOUNTER — Other Ambulatory Visit: Payer: Self-pay | Admitting: Radiation Therapy

## 2020-10-22 NOTE — Progress Notes (Signed)
  Patient Name: Julie Ross MRN: [id number] DOB: 1961-04-19 Referring Physician: Annye Asa (Profile Not Attached) Date of Service: 10/15/2020 [facility], [place]                                                        End Of Treatment Note  Diagnoses: 174.4-Malignant neoplasm of upper-outer quadrant of female breast D32.0-Benign neoplasm of cerebral meninges  Cancer Staging: Multifocal Meningioma  Intent: Curative  Radiation Treatment Dates: 10/15/2020 through 10/15/2020 Site Technique Total Dose (Gy) Dose per Fx (Gy) Completed Fx Beam Energies  Brain: Brain PTV1 Lt Tentorium PTV2 Lt Sphenoid IMRT 14/14 14 1/1 6XFFF   Narrative: The patient tolerated radiation therapy relatively well.    Plan: The patient will receive a call in about one month from the radiation oncology department. She will continue follow up with Dr. Mickeal Skinner in surveillance as well.   ________________________________________________    Carola Rhine, PAC

## 2020-11-22 NOTE — Progress Notes (Signed)
  Radiation Oncology         [phone] ________________________________  Name: Julie Ross MRN: [id number]  Date of Service: 11/23/2020  DOB: [birth year]  Post Treatment Telephone Note  Diagnosis:   Multifocal Meningioma  Interval Since Last Radiation:  6 weeks   10/15/2020 through 10/15/2020 SRS Treatment Site Technique Total Dose (Gy) Dose per Fx (Gy) Completed Fx Beam Energies  Brain: Brain PTV1 Lt Tentorium PTV2 Lt Sphenoid IMRT 14/14 14 1/1 6XFFF     Narrative:  The patient was contacted today for routine follow-up. During treatment she did very well with radiotherapy and did not have significant desquamation.    Impression/Plan: 1. Multifocal Meningioma. The patient was unable to take my call. I left a voicemail and on the message I discussed that we would be happy to continue to follow her as needed, but she will also continue to follow up with Dr. Mickeal Skinner in neuro oncology.        Carola Rhine, PAC

## 2020-11-23 ENCOUNTER — Ambulatory Visit
Admission: RE | Admit: 2020-11-23 | Discharge: 2020-11-23 | Disposition: A | Discharge status: Home / Self Care | Payer: Commercial Managed Care - PPO | Source: Ambulatory Visit | Attending: Radiation Oncology | Admitting: Radiation Oncology

## 2020-11-23 DIAGNOSIS — C50411 Malignant neoplasm of upper-outer quadrant of right female breast: Secondary | ICD-10-CM

## 2020-11-23 DIAGNOSIS — Z170 Estrogen receptor positive status [ER+]: Secondary | ICD-10-CM

## 2020-11-24 ENCOUNTER — Telehealth: Payer: Self-pay | Admitting: Radiation Therapy

## 2020-11-24 NOTE — Telephone Encounter (Signed)
Spoke with pt about the upcoming brain MRI and visit with Dr. Mickeal Skinner in July.   Mont Dutton R.T.(R)(T) Radiation Special Procedures Navigator

## 2020-11-26 ENCOUNTER — Ambulatory Visit: Payer: Commercial Managed Care - PPO | Admitting: Neurology

## 2020-11-26 ENCOUNTER — Encounter: Payer: Self-pay | Admitting: Family Medicine

## 2020-11-26 ENCOUNTER — Encounter: Payer: Self-pay | Admitting: Neurology

## 2020-11-26 VITALS — BP 130/80 | HR 71 | Ht 66.0 in | Wt 205.0 lb

## 2020-11-26 DIAGNOSIS — G35 Multiple sclerosis: Secondary | ICD-10-CM

## 2020-11-26 DIAGNOSIS — D329 Benign neoplasm of meninges, unspecified: Secondary | ICD-10-CM

## 2020-11-26 DIAGNOSIS — R5383 Other fatigue: Secondary | ICD-10-CM | POA: Diagnosis not present

## 2020-11-26 DIAGNOSIS — Z853 Personal history of malignant neoplasm of breast: Secondary | ICD-10-CM

## 2020-11-26 MED ORDER — CLONAZEPAM 1 MG PO TABS: 1.0000 mg | ORAL_TABLET | Freq: Every day | ORAL | 1 refills | Status: DC

## 2020-11-26 NOTE — Progress Notes (Signed)
[facility]  PATIENT: Julie Ross DOB: 1961/04/03  REFERRING DOCTOR OR PCP:  Annye Asa SOURCE: patient and records from [facility]  _________________________________   HISTORICAL  CHIEF COMPLAINT:  Chief Complaint  Patient presents with   Follow-up    RM 13, alone. Last seen 05/28/2020 by AL,NP. Off DMT for MS. She was concerned about not knowing about meningioma before now. No one told her. Pt reports she had radiation 10/06/20. Has to go back 01/14/21 for MRI.     HISTORY OF PRESENT ILLNESS:  Julie Ross is a 60 y.o. woman with multiple sclerosis and breast cancer.    Update 11/26/2020: She feels her MS has been stable.  She has no recent exacerbations.  She has been off of disease modifying therapies for several years.  She continues to note some difficulty with reduced gait and balance.  She does not note much weakness or dysesthesias.  Bladder is doing well.  MS lesions on the MRI of the brain 10/01/2020 was unchanged compared to the MRI 06/16/2020 and 04/13/2018 MRI is also showed meningiomas that have increased in size.  She notes fatigue.    She has trouble falling asleep and staying asleep.    She often falls asleep more than an hour after falling asleep.  If she falls asleep .  She often goes back to sleep after she gets up in the morning and feeds the dogs, sleeping another couple hours.  She has trouble getting comfortable.  She has aching in her calves  She had stereotactic radiosurgery 10/06/2020 for the meningiomas (left sphenoid wing and left tentorial)   She had breast cancer in 2013.   She was seeing Dr. Lindi Adie.   She has no recurrence and is off all medications for it now.   She had neurosurgery at [facility] (had synovial cyst and spondylolisthesis; Dr. Zada Finders).   She did need to get Narcan after she was unable to be aroused after opiates post-op.  She is doing better but does note buttock pain if sitting a long time.     She  tries to exercise some with a recombent bike  MS History:    She was diagnosed with MS in 1999 after presenting with optic neuritis.   She had a spinal tap, MRI and angiogram.   Initially, the diagnosis was uncertain but she had changes in the MRI over time leading to a diagnosis of MS a few years later.    Initially, she was placed on Copaxone, then switched to Avonex due to skin reactions.   She then switched to Tysabri when she had some mild breakthrough disease. She tolerated Tysabri very well and her MS did well.  However, she was JCV antibody positive so she stopped in 2012.   She did not restart Avonex as she did not want to go back to an injection.    Her last exacerbation was optic neuritis in 2012 treated with steroids. She proved but did not get back to baseline.  This exacerbation occurred off any DMT.  She tried Gilenya in 2013. She had trouble tolerating Gilenya and discontinued shortly after starting it. She was going to start Tecfidera but was diagnosed with breast cancer in 2013 and opted not to start at that time. She received chemotherapy including Taxotere, carboplatin, Herceptin in 2013 and 2014. An MRI of the brain in 2015 showed only mild MS progression compared to her previous one in 2011.   She stopped disease modifying therapies in  2014 and 2015.  Subsequent MRIs have been stable.  IMAGING: MRI of the brain 04/13/2018 showed multiple T2/FLAIR hyperintense foci in the brainstem, cerebellum and hemispheres in a pattern and configuration consistent with demyelinating plaque associated with multiple sclerosis.  One focus in the right splenium was not present on the 2017 MRI and shows some enhancement without edema consistent with a subacute demyelinating plaque.  Other foci were present on the 2017 MRI.     Two meningeal based foci (left sphenoid wing and left tentorium) as described above consistent with meningiomas.  They are described as the same size on the 2018 MRI report.   Complex  stable pineal cyst  MRI of the brain 06/16/2020 showed stable MS lesions.  These meningiomas had increased in size compared to 2019  MRI of the brain 10/01/2020 showed no further changes compared to 06/16/2020  REVIEW OF SYSTEMS: Constitutional: No fevers, chills, sweats, or change in appetite.  Notes fatigue Eyes: No visual changes, double vision, eye pain Ear, nose and throat: No hearing loss, ear pain, nasal congestion, sore throat.  Recent URI Cardiovascular: No chest pain, palpitations Respiratory:  No shortness of breath at rest or with exertion.   No wheezes.  Has OSA GastrointestinaI: No nausea, vomiting, diarrhea, abdominal pain, fecal incontinence Genitourinary:  No dysuria, urinary retention or frequency.  She has 1 times nocturia most nights Musculoskeletal:  No neck pain, back pain.  She has bilateral knee pain Integumentary: No rash, pruritus, skin lesions Neurological: as above Psychiatric: No depression at this time.  Some anxiety Endocrine: No palpitations, diaphoresis, change in appetite, change in weigh or increased thirst Hematologic/Lymphatic:  No anemia, purpura, petechiae. Allergic/Immunologic: No itchy/runny eyes, nasal congestion, recent allergic reactions, rashes  ALLERGIES: Allergies  Allergen Reactions   Bee Venom Anaphylaxis    HOME MEDICATIONS:  Current Outpatient Medications:    Ascorbic Acid (VITAMIN C PO), Take by mouth., Disp: , Rfl:    atorvastatin (LIPITOR) 20 MG tablet, Take 1 tablet (20 mg total) by mouth daily., Disp: 90 tablet, Rfl: 0   baclofen (LIORESAL) 10 MG tablet, TAKE 1 TABLET THREE TIMES A DAY AS NEEDED FOR MUSCLE SPASMS, Disp: 270 tablet, Rfl: 3   buPROPion (WELLBUTRIN XL) 150 MG 24 hr tablet, TAKE 1 TABLET DAILY, Disp: 90 tablet, Rfl: 3   cetirizine (ZYRTEC) 10 MG tablet, Take 1 tablet (10 mg total) by mouth daily., Disp: 90 tablet, Rfl: 3   Cholecalciferol (VITAMIN D3 PO), Take by mouth., Disp: , Rfl:    clonazePAM (KLONOPIN) 1 MG  tablet, Take 1 tablet (1 mg total) by mouth at bedtime., Disp: 90 tablet, Rfl: 1   Cyanocobalamin (VITAMIN B-12 PO), Take by mouth., Disp: , Rfl:    EPINEPHrine 0.3 mg/0.3 mL IJ SOAJ injection, Inject 0.3 mLs (0.3 mg total) into the muscle as needed for anaphylaxis., Disp: 1 each, Rfl: 1   fluticasone (FLONASE) 50 MCG/ACT nasal spray, Place 2 sprays into both nostrils daily., Disp: 16 g, Rfl: 6   gabapentin (NEURONTIN) 600 MG tablet, Take 1 tablet (600 mg total) by mouth 3 (three) times daily., Disp: 270 tablet, Rfl: 4   HYDROcodone-acetaminophen (NORCO/VICODIN) 5-325 MG tablet, Take 1 tablet by mouth 2 (two) times daily as needed for pain., Disp: , Rfl:    KLOR-CON M20 20 MEQ tablet, TAKE 1 TABLET DAILY, Disp: 90 tablet, Rfl: 3   levothyroxine (SYNTHROID) 88 MCG tablet, TAKE 1 TABLET DAILY BEFORE BREAKFAST, Disp: 90 tablet, Rfl: 3   losartan-hydrochlorothiazide (HYZAAR) 100-25 MG  tablet, TAKE 1 TABLET EVERY MORNING BEFORE BREAKFAST, Disp: 90 tablet, Rfl: 0   omeprazole (PRILOSEC) 40 MG capsule, TAKE 1 CAPSULE DAILY, Disp: 90 capsule, Rfl: 3   ondansetron (ZOFRAN) 4 MG tablet, Take 1 tablet (4 mg total) by mouth every 8 (eight) hours as needed for nausea or vomiting., Disp: 20 tablet, Rfl: 0   vitamin E 400 UNIT capsule, Take 800 Units by mouth daily., Disp: , Rfl:   PAST MEDICAL HISTORY: Past Medical History:  Diagnosis Date   Anemia    h/o - ? bld. transfusion - more recent- ?2013   Anxiety    uses xanax mainly for sleep   Arthritis    knees    Basal cell carcinoma 01/11/2005   rim left nostril-,mohs   BCC (basal cell carcinoma) 01/11/2005   right cheek mid-mohs   BCC (basal cell carcinoma) 09/12/2006   right forehead -mohs   BCC (basal cell carcinoma) 01/19/2016   right upper arm -[id number]   BCC (basal cell carcinoma) 02/18/2019   forehead-mohs   Breast cancer ([place]) 11/2011   lul/ER+PR+, x1 lymph node    Cancer (HCC)    squamous cell skin cancer x7   Depression    GERD  (gastroesophageal reflux disease)    H/O bone density study 03/2011   H/O colonoscopy    H/O echocardiogram    last one 02/2013, due to chemotherapy   Heart burn    Hx of radiation therapy 04/17/12- 06/04/12   left chest wall, axilla, L supraclavicular fossa 4500 cGy, left mastectomy scar/chest wall 5940 cGy   Hypertension    Hypothyroidism    Meningioma ([place]) 08/2020   Multiple sclerosis (HCC)    Neuromuscular disorder ([place])    MS TX WITH CYMBALTA    Personal history of chemotherapy    Personal history of radiation therapy    SCC (squamous cell carcinoma) 01/11/2005   chest mid   Sleep apnea    MILD SLEEP APNEA, NO (Needed) MACHINE  6 YRS AGO HPT [facility]    Squamous cell carcinoma of skin 01/11/2005   over left lip   Wears glasses     PAST SURGICAL HISTORY: Past Surgical History:  Procedure Laterality Date   APPLICATION OF ROBOTIC ASSISTANCE FOR SPINAL PROCEDURE N/A 03/12/2019   Procedure: APPLICATION OF ROBOTIC ASSISTANCE FOR SPINAL PROCEDURE;  Surgeon: Judith Part, MD;  Location: [place];  Service: Neurosurgery;  Laterality: N/A;   CESAREAN SECTION     X2    COLONOSCOPY     ESOPHAGOGASTRODUODENOSCOPY     FLEXIBLE SIGMOIDOSCOPY     I & D KNEE WITH POLY EXCHANGE  03/02/2012   Procedure: IRRIGATION AND DEBRIDEMENT KNEE WITH POLY EXCHANGE;  Surgeon: Alta Corning, MD;  Location: [place];  Service: Orthopedics;  Laterality: Left;  I&D of total knee with Possible Poly Exchange    JOINT REPLACEMENT  Feb 2013   left knee- multiple surgeries    KNEE ARTHROSCOPY     LT KNEE 04/2011   KNEE ARTHROSCOPY  84   rt   MASTECTOMY     MASTECTOMY W/ SENTINEL NODE BIOPSY  12/19/2011   Procedure: MASTECTOMY WITH SENTINEL LYMPH NODE BIOPSY;  Surgeon: Haywood Lasso, MD;  Location: [place];  Service: General;  Laterality: Left;  left breast and left axilla    MOHS SURGERY     right face   NO PAST SURGERIES     BLADDER SLING  4-5 YRS AGO    PORT-A-CATH REMOVAL  03/06/2012   Procedure:  REMOVAL PORT-A-CATH;  Surgeon: Odis Hollingshead, MD;  Location: [place];  Service: General;  Laterality: N/A;   PORT-A-CATH REMOVAL Right 08/22/2012   Procedure: REMOVAL PORT-A-CATH;  Surgeon: Edward Jolly, MD;  Location: [place];  Service: General;  Laterality: Right;   PORTACATH PLACEMENT  01/16/2012   Procedure: INSERTION PORT-A-CATH;  Surgeon: Haywood Lasso, MD;  Location: [place];  Service: General;  Laterality: Right;  Porta Cath Placement    PORTACATH PLACEMENT  05/01/2012   Procedure: INSERTION PORT-A-CATH;  Surgeon: Haywood Lasso, MD;  Location: [place];  Service: General;  Laterality: Right;  right internal jugular port-a-cath insertion   TEE WITHOUT CARDIOVERSION  03/06/2012   Procedure: TRANSESOPHAGEAL ECHOCARDIOGRAM (TEE);  Surgeon: Josue Hector, MD;  Location: [place];  Service: Cardiovascular;  Laterality: N/A;  Rm. [id number]   TONSILLECTOMY     TOTAL KNEE ARTHROPLASTY  08/15/2011   Procedure: TOTAL KNEE ARTHROPLASTY; lft Surgeon: Alta Corning, MD;  Location: [place];  Service: Orthopedics;  Laterality: Left;  RIGHT KNEE CORTIZONE INJECTION   TOTAL KNEE ARTHROPLASTY Left 08/18/2012   Procedure:  Irrigation and debridement of LEFT total knee;  Removal of total knee parts; Implant of spacers;  Surgeon: Kerin Salen, MD;  Location: [place];  Service: Orthopedics;  Laterality: Left;   TOTAL KNEE ARTHROPLASTY Right 08/12/2013   TOTAL KNEE ARTHROPLASTY Right 08/12/2013   Procedure: RIGHT TOTAL KNEE ARTHROPLASTY;  Surgeon: Kerin Salen, MD;  Location: [place];  Service: Orthopedics;  Laterality: Right;   TOTAL KNEE REVISION Left 04/19/2013   Procedure: TOTAL KNEE REVISION AND REMOVAL CEMENT SPACER;  Surgeon: Kerin Salen, MD;  Location: [place];  Service: Orthopedics;  Laterality: Left;   TRANSFORAMINAL LUMBAR INTERBODY FUSION (TLIF) WITH PEDICLE SCREW FIXATION 1 LEVEL Bilateral 03/12/2019   Procedure: Lumbar four-five Open transforaminal lumbar  interbody fusion with Lumbar four-five laminectomy, bilateral Lumbar four-five Pedicle screw placement;  Surgeon: Judith Part, MD;  Location: [place];  Service: Neurosurgery;  Laterality: Bilateral;   TUMOR REMOVAL     FROM PELVIS AGE 67    FAMILY HISTORY: Family History  Problem Relation Age of Onset   Breast cancer Paternal Aunt        dx in her 48s   Heart disease Maternal Grandfather    Lung cancer Maternal Uncle        smoker   Breast cancer Paternal Grandmother        dx in her 40s   Ovarian cancer Paternal Aunt        dx in her 81s   Arthritis Mother    Hypertension Mother    Heart disease Mother        CHF   Dementia Father    Diabetes type II Father     SOCIAL HISTORY:  Social History   Socioeconomic History   Marital status: Married    Spouse name: Not on file   Number of children: 2   Years of education: Not on file   Highest education level: Not on file  Occupational History    Employer: [employer]   Tobacco Use   Smoking status: Never   Smokeless tobacco: Never  Vaping Use   Vaping Use: Never used  Substance and Sexual Activity   Alcohol use: No   Drug use: No   Sexual activity: Yes  Other Topics Concern   Not on file  Social History Narrative  Not on file   Social Determinants of Health   Financial Resource Strain: Not on file  Food Insecurity: Not on file  Transportation Needs: Not on file  Physical Activity: Not on file  Stress: Not on file  Social Connections: Not on file  Intimate Partner Violence: Not on file     PHYSICAL EXAM  Vitals:   11/26/20 1449  BP: 130/80  Pulse: 71  Weight: 205 lb (93 kg)  Height: 5\' 6"  (1.676 m)    Body mass index is 33.09 kg/m.   General: The patient is well-developed and well-nourished and in no acute distress.   Reduced range of motion of the lower back.  She is tender in the lower lumbar paraspinal region and over the piriformis/SI region bilaterally   Neurologic Exam  Mental  status: The patient is alert and oriented x 3 at the time of the examination. The patient has apparent normal recent and remote memory, with an apparently normal attention span and concentration ability.   Speech is normal.  Cranial nerves: Extraocular movements are full.  Facial strength and sensation is normal. The tongue is midline, and the patient has symmetric elevation of the soft palate. No obvious hearing deficits are noted.  Motor:  Muscle bulk is normal.   Muscle tone is normal.  Strength appears to be normal in the legs except right EHL (4/5)  Sensory: Intact sensation to touch and vibration in legs.    Coordination: Finger-nose-finger is performed well...  Gait and station: Station is normal.   Gait is mildly wide.  Tandem gait is wide.   The Romberg is negative.  Reflexes: Deep tendon reflexes are symmetric and normal bilaterally in arms.  DTRs are absent at the ankles.  ____________________________________________________  ASSESSMENT AND PLAN  Multiple sclerosis ([place])  Meningioma (HCC)  Other fatigue  H/O malignant neoplasm of breast   1.   She will continue off of her disease modifying therapy.  She has an MRI of the brain next month to follow-up the meningiomas. 2.   Try to stay active 3.    Change Xanax to clonazepam to see if sleep improves with a longer acting agent. 4.  she will return to see [place] in 6 months or sooner if she has new or worsening neurologic symptoms.    Wing Gfeller A. Felecia Shelling, MD, PhD 0/02/2329, 0:76 PM Certified in Neurology, Clinical Neurophysiology, Sleep Medicine, Pain Medicine and Neuroimaging  [facility] [address] [place], [place] [address] [phone]

## 2020-11-27 ENCOUNTER — Other Ambulatory Visit: Payer: Self-pay

## 2020-11-27 DIAGNOSIS — D320 Benign neoplasm of cerebral meninges: Secondary | ICD-10-CM | POA: Insufficient documentation

## 2020-11-27 DIAGNOSIS — Z0182 Encounter for allergy testing: Secondary | ICD-10-CM

## 2020-12-01 LAB — HM PAP SMEAR

## 2020-12-08 ENCOUNTER — Other Ambulatory Visit: Payer: Self-pay

## 2020-12-08 DIAGNOSIS — T782XXD Anaphylactic shock, unspecified, subsequent encounter: Secondary | ICD-10-CM

## 2020-12-08 MED ORDER — EPINEPHRINE 0.3 MG/0.3ML IJ SOAJ: 0.3000 mg | INTRAMUSCULAR | 3 refills | Status: DC | PRN

## 2020-12-09 ENCOUNTER — Other Ambulatory Visit: Payer: Self-pay | Admitting: Neurology

## 2020-12-16 ENCOUNTER — Encounter: Payer: Self-pay | Admitting: *Deleted

## 2021-01-12 ENCOUNTER — Ambulatory Visit: Payer: Commercial Managed Care - PPO | Admitting: Family Medicine

## 2021-01-14 ENCOUNTER — Ambulatory Visit (HOSPITAL_COMMUNITY)
Admission: RE | Admit: 2021-01-14 | Discharge: 2021-01-14 | Disposition: A | Discharge status: Home / Self Care | Payer: Commercial Managed Care - PPO | Source: Ambulatory Visit | Attending: Radiation Oncology | Admitting: Radiation Oncology

## 2021-01-14 ENCOUNTER — Other Ambulatory Visit: Payer: Self-pay

## 2021-01-14 DIAGNOSIS — D329 Benign neoplasm of meninges, unspecified: Secondary | ICD-10-CM | POA: Diagnosis present

## 2021-01-14 MED ORDER — GADOBUTROL 1 MMOL/ML IV SOLN
10.0000 mL | Freq: Once | INTRAVENOUS | Status: AC | PRN
  Administered 2021-01-14: 10 mL via INTRAVENOUS

## 2021-01-18 ENCOUNTER — Other Ambulatory Visit: Payer: Self-pay | Admitting: Family Medicine

## 2021-01-18 ENCOUNTER — Inpatient Hospital Stay: Payer: Commercial Managed Care - PPO | Attending: Internal Medicine | Admitting: Internal Medicine

## 2021-01-18 ENCOUNTER — Other Ambulatory Visit: Payer: Self-pay

## 2021-01-18 VITALS — BP 132/66 | HR 65 | Temp 97.8°F | Resp 18 | Ht 66.0 in | Wt 205.7 lb

## 2021-01-18 DIAGNOSIS — Z923 Personal history of irradiation: Secondary | ICD-10-CM | POA: Insufficient documentation

## 2021-01-18 DIAGNOSIS — Z170 Estrogen receptor positive status [ER+]: Secondary | ICD-10-CM | POA: Insufficient documentation

## 2021-01-18 DIAGNOSIS — Z803 Family history of malignant neoplasm of breast: Secondary | ICD-10-CM | POA: Insufficient documentation

## 2021-01-18 DIAGNOSIS — D329 Benign neoplasm of meninges, unspecified: Secondary | ICD-10-CM | POA: Diagnosis present

## 2021-01-18 DIAGNOSIS — G35 Multiple sclerosis: Secondary | ICD-10-CM | POA: Diagnosis not present

## 2021-01-18 DIAGNOSIS — Z79899 Other long term (current) drug therapy: Secondary | ICD-10-CM | POA: Insufficient documentation

## 2021-01-18 DIAGNOSIS — Z85828 Personal history of other malignant neoplasm of skin: Secondary | ICD-10-CM | POA: Insufficient documentation

## 2021-01-18 DIAGNOSIS — Z8041 Family history of malignant neoplasm of ovary: Secondary | ICD-10-CM | POA: Insufficient documentation

## 2021-01-18 DIAGNOSIS — Z801 Family history of malignant neoplasm of trachea, bronchus and lung: Secondary | ICD-10-CM | POA: Diagnosis not present

## 2021-01-18 DIAGNOSIS — C50412 Malignant neoplasm of upper-outer quadrant of left female breast: Secondary | ICD-10-CM | POA: Insufficient documentation

## 2021-01-18 DIAGNOSIS — Z9012 Acquired absence of left breast and nipple: Secondary | ICD-10-CM | POA: Diagnosis not present

## 2021-01-18 DIAGNOSIS — Z9221 Personal history of antineoplastic chemotherapy: Secondary | ICD-10-CM | POA: Insufficient documentation

## 2021-01-19 ENCOUNTER — Encounter (HOSPITAL_BASED_OUTPATIENT_CLINIC_OR_DEPARTMENT_OTHER): Payer: Self-pay | Admitting: Plastic Surgery

## 2021-01-19 ENCOUNTER — Other Ambulatory Visit: Payer: Self-pay

## 2021-01-19 ENCOUNTER — Ambulatory Visit: Payer: Commercial Managed Care - PPO | Admitting: Family Medicine

## 2021-01-19 NOTE — Progress Notes (Signed)
[facility] [place], [place] [address] [phone]   New Patient Evaluation  Date of Service: 01/19/21 Patient Name: Julie Ross Patient MRN: [id number] Patient DOB: 09-18-1960 Provider: Ventura Sellers, MD  Identifying Statement:  Julie Ross is a 60 y.o. female with  multifocal  meningioma who presents for initial consultation and evaluation.    Referring Provider: Kyung Rudd, MD [address] [place],  Julie [place] [id number]  Oncologic History: Oncology History  Breast cancer of upper-outer quadrant of right female breast ([place])  11/22/2011 Initial Diagnosis   Cancer of upper-outer quadrant of female breast    12/19/2011 Surgery   Left mastectomy with SLN biopsy 2 foci of invasive ductal carcinoma 0.8 and 0.6 cm low-grade ER PR positive HER-2 positive ratio 3.27 Ki-67 11% one lymph node positive out of 4 (T1, N1, M0 stage II)    02/16/2012 - 04/14/2013 Chemotherapy   Taxotere, carboplatin, Herceptin x2 cycles complicated by bacteremia and left knee sepsis requiring revision of the knee. Herceptin maintenance every 3 weeks. Herceptin maintenance completed 04/14/2013    04/17/2012 - 06/04/2012 Radiation Therapy   Radiation therapy to the breast    02/14/2013 Genetic Testing   Negative genetic testing on the Breast/Ovarian Cancer panel.  The Breast/Ovarian gene panel offered by [facility] includes sequencing and rearrangement analysis for the following 21 genes:  ATM, BARD1, BRCA1, BRCA2, BRIP1, CDH1, CHEK2, EPCAM, FANCC, MLH1, MSH2, MSH6, NBN, PALB2, PMS2, PTEN, STK11, RAD51C, RAD51D, TP53, and XRCC2.   The report date is February 14, 2013.   03/13/2013 -  Anti-estrogen oral therapy   Letrozole 2.5 mg once daily      CNS Oncologic History 10/15/20: SRS to two meningomas, L sphenoid wing and L tentorium (Julie Ross)  History of Present Illness: The patient's records from the referring physician were obtained and reviewed and the  patient interviewed to confirm this HPI.  Julie Ross presents today for follow up after completing post-SRS MRI study.  She describes no new or progressive neurologic deficits.  No facial weakness, numbness, or double vision.  Continues to follow with Dr. Felecia Shelling for history of MS.  Functional status is independent, doesn't require assistance with ADLs.  Medications: Current Outpatient Medications on File Prior to Visit  Medication Sig Dispense Refill   Ascorbic Acid (VITAMIN C PO) Take by mouth.     atorvastatin (LIPITOR) 20 MG tablet Take 1 tablet (20 mg total) by mouth daily. 90 tablet 0   baclofen (LIORESAL) 10 MG tablet TAKE 1 TABLET THREE TIMES A DAY AS NEEDED FOR MUSCLE SPASMS 270 tablet 3   buPROPion (WELLBUTRIN XL) 150 MG 24 hr tablet TAKE 1 TABLET DAILY 90 tablet 3   cetirizine (ZYRTEC) 10 MG tablet Take 1 tablet (10 mg total) by mouth daily. 90 tablet 3   Cholecalciferol (VITAMIN D3 PO) Take by mouth.     clonazePAM (KLONOPIN) 1 MG tablet Take 1 tablet (1 mg total) by mouth at bedtime. 90 tablet 1   Cyanocobalamin (VITAMIN B-12 PO) Take by mouth.     EPINEPHrine 0.3 mg/0.3 mL IJ SOAJ injection Inject 0.3 mg into the muscle as needed for anaphylaxis. 1 each 3   fluticasone (FLONASE) 50 MCG/ACT nasal spray Place 2 sprays into both nostrils daily. 16 g 6   gabapentin (NEURONTIN) 600 MG tablet TAKE 1 TABLET THREE TIMES A DAY 270 tablet 3   HYDROcodone-acetaminophen (NORCO/VICODIN) 5-325 MG tablet Take 1 tablet by mouth  2 (two) times daily as needed for pain.     levothyroxine (SYNTHROID) 88 MCG tablet TAKE 1 TABLET DAILY BEFORE BREAKFAST 90 tablet 3   losartan-hydrochlorothiazide (HYZAAR) 100-25 MG tablet TAKE 1 TABLET EVERY MORNING BEFORE BREAKFAST 90 tablet 0   omeprazole (PRILOSEC) 40 MG capsule TAKE 1 CAPSULE DAILY 90 capsule 3   ondansetron (ZOFRAN) 4 MG tablet Take 1 tablet (4 mg total) by mouth every 8 (eight) hours as needed for nausea or vomiting. 20 tablet 0   vitamin E 400  UNIT capsule Take 800 Units by mouth daily.     No current facility-administered medications on file prior to visit.    Allergies:  Allergies  Allergen Reactions   Bee Venom Anaphylaxis   Past Medical History:  Past Medical History:  Diagnosis Date   Anemia    h/o - ? bld. transfusion - more recent- ?2013   Anxiety    uses xanax mainly for sleep   Arthritis    knees    Basal cell carcinoma 01/11/2005   rim left nostril-,mohs   BCC (basal cell carcinoma) 01/11/2005   right cheek mid-mohs   BCC (basal cell carcinoma) 09/12/2006   right forehead -mohs   BCC (basal cell carcinoma) 01/19/2016   right upper arm -cx7f   BCC (basal cell carcinoma) 02/18/2019   forehead-mohs   Breast cancer ([facility] 11/2011   lul/ER+PR+, x1 lymph node    Cancer (HCC)    squamous cell skin cancer x7   Depression    GERD (gastroesophageal reflux disease)    H/O bone density study 03/2011   H/O colonoscopy    H/O echocardiogram    last one 02/2013, due to chemotherapy   Heart burn    Hx of radiation therapy 04/17/12- 06/04/12   left chest wall, axilla, L supraclavicular fossa 4500 cGy, left mastectomy scar/chest wall 5940 cGy   Hypertension    Hypothyroidism    Meningioma ([facility] 08/2020   Multiple sclerosis (HCC)    Neuromuscular disorder ([facility]    MS TX WITH CYMBALTA    Personal history of chemotherapy    Personal history of radiation therapy    SCC (squamous cell carcinoma) 01/11/2005   chest mid   Sleep apnea    MILD SLEEP APNEA, NO (Needed) MACHINE  6 YRS AGO HPT [facility]    Squamous cell carcinoma of skin 01/11/2005   over left lip   Wears glasses    Past Surgical History:  Past Surgical History:  Procedure Laterality Date   APPLICATION OF ROBOTIC ASSISTANCE FOR SPINAL PROCEDURE N/A 03/12/2019   Procedure: APPLICATION OF ROBOTIC ASSISTANCE FOR SPINAL PROCEDURE;  Surgeon: OJudith Part MD;  [facility]  Service: Neurosurgery;  Laterality: N/A;   CESAREAN SECTION     X2     COLONOSCOPY     ESOPHAGOGASTRODUODENOSCOPY     FLEXIBLE SIGMOIDOSCOPY     I & D KNEE WITH POLY EXCHANGE  03/02/2012   Procedure: IRRIGATION AND DEBRIDEMENT KNEE WITH POLY EXCHANGE;  Surgeon: JAlta Corning MD;  [place] [facility]  Service: Orthopedics;  Laterality: Left;  I&D of total knee with Possible Poly Exchange    JOINT REPLACEMENT  Feb 2013   left knee- multiple surgeries    KNEE ARTHROSCOPY     LT KNEE 04/2011   KNEE ARTHROSCOPY  82   rt   MASTECTOMY     MASTECTOMY W/ SENTINEL NODE BIOPSY  12/19/2011   Procedure: MASTECTOMY WITH SENTINEL LYMPH NODE BIOPSY;  Surgeon:  Haywood Lasso, MD;  Location: [facility];  Service: General;  Laterality: Left;  left breast and left axilla    MOHS SURGERY     right face   NO PAST SURGERIES     BLADDER SLING  4-5 YRS AGO    PORT-A-CATH REMOVAL  03/06/2012   Procedure: REMOVAL PORT-A-CATH;  Surgeon: Odis Hollingshead, MD;  Location: [facility];  Service: General;  Laterality: N/A;   PORT-A-CATH REMOVAL Right 08/22/2012   Procedure: REMOVAL PORT-A-CATH;  Surgeon: Edward Jolly, MD;  Location: [facility];  Service: General;  Laterality: Right;   PORTACATH PLACEMENT  01/16/2012   Procedure: INSERTION PORT-A-CATH;  Surgeon: Haywood Lasso, MD;  Location: [facility];  Service: General;  Laterality: Right;  Porta Cath Placement    PORTACATH PLACEMENT  05/01/2012   Procedure: INSERTION PORT-A-CATH;  Surgeon: Haywood Lasso, MD;  Location: [facility];  Service: General;  Laterality: Right;  right internal jugular port-a-cath insertion   TEE WITHOUT CARDIOVERSION  03/06/2012   Procedure: TRANSESOPHAGEAL ECHOCARDIOGRAM (TEE);  Surgeon: Josue Hector, MD;  Location: [facility];  Service: Cardiovascular;  Laterality: N/A;  Rm. [id number]   TONSILLECTOMY     TOTAL KNEE ARTHROPLASTY  08/15/2011   Procedure: TOTAL KNEE ARTHROPLASTY; lft Surgeon: Alta Corning, MD;  Location: [facility];  Service: Orthopedics;  Laterality: Left;  RIGHT KNEE  CORTIZONE INJECTION   TOTAL KNEE ARTHROPLASTY Left 08/18/2012   Procedure:  Irrigation and debridement of LEFT total knee;  Removal of total knee parts; Implant of spacers;  Surgeon: Kerin Salen, MD;  Location: [facility];  Service: Orthopedics;  Laterality: Left;   TOTAL KNEE ARTHROPLASTY Right 08/12/2013   TOTAL KNEE ARTHROPLASTY Right 08/12/2013   Procedure: RIGHT TOTAL KNEE ARTHROPLASTY;  Surgeon: Kerin Salen, MD;  Location: [facility];  Service: Orthopedics;  Laterality: Right;   TOTAL KNEE REVISION Left 04/19/2013   Procedure: TOTAL KNEE REVISION AND REMOVAL CEMENT SPACER;  Surgeon: Kerin Salen, MD;  Location: [facility];  Service: Orthopedics;  Laterality: Left;   TRANSFORAMINAL LUMBAR INTERBODY FUSION (TLIF) WITH PEDICLE SCREW FIXATION 1 LEVEL Bilateral 03/12/2019   Procedure: Lumbar four-five Open transforaminal lumbar interbody fusion with Lumbar four-five laminectomy, bilateral Lumbar four-five Pedicle screw placement;  Surgeon: Judith Part, MD;  Location: [facility];  Service: Neurosurgery;  Laterality: Bilateral;   TUMOR REMOVAL     FROM [facility] AGE 41   Social History:  Social History   Socioeconomic History   Marital status: Married    Spouse name: Not on file   Number of children: 2   Years of education: Not on file   Highest education level: Not on file  Occupational History    Employer: [employer]   Tobacco Use   Smoking status: Never   Smokeless tobacco: Never  Vaping Use   Vaping Use: Never used  Substance and Sexual Activity   Alcohol use: No   Drug use: No   Sexual activity: Yes  Other Topics Concern   Not on file  Social History Narrative   Not on file   Social Determinants of Health   Financial Resource Strain: Not on file  Food Insecurity: Not on file  Transportation Needs: Not on file  Physical Activity: Not on file  Stress: Not on file  Social Connections: Not on file  Intimate Partner Violence: Not on file   Family History:  Family History   Problem Relation Age of Onset  Breast cancer Paternal Aunt        dx in her 75s   Heart disease Maternal Grandfather    Lung cancer Maternal Uncle        smoker   Breast cancer Paternal Grandmother        dx in her 7s   Ovarian cancer Paternal Aunt        dx in her 72s   Arthritis Mother    Hypertension Mother    Heart disease Mother        CHF   Dementia Father    Diabetes type II Father     Review of Systems: Constitutional: Doesn't report fevers, chills or abnormal weight loss Eyes: Doesn't report blurriness of vision Ears, nose, mouth, throat, and face: Doesn't report sore throat Respiratory: Doesn't report cough, dyspnea or wheezes Cardiovascular: Doesn't report palpitation, chest discomfort  Gastrointestinal:  Doesn't report nausea, constipation, diarrhea GU: Doesn't report incontinence Skin: Doesn't report skin rashes Neurological: Per HPI Musculoskeletal: Doesn't report joint pain Behavioral/Psych: Doesn't report anxiety  Physical Exam: Vitals:   01/18/21 0938  BP: 132/66  Pulse: 65  Resp: 18  Temp: 97.8 F (36.6 C)  SpO2: 98%   KPS: 90. General: Alert, cooperative, pleasant, in no acute distress Head: Normal EENT: No conjunctival injection or scleral icterus.  Lungs: Resp effort normal Cardiac: Regular rate Abdomen: Non-distended abdomen Skin: No rashes cyanosis or petechiae. Extremities: No clubbing or edema  Neurologic Exam: Mental Status: Awake, alert, attentive to examiner. Oriented to self and environment. Language is fluent with intact comprehension.  Cranial Nerves: Visual acuity is grossly normal. Visual fields are full. Extra-ocular movements intact. No ptosis. Face is symmetric Motor: Tone and bulk are normal. Power is full in both arms and legs. Reflexes are symmetric, no pathologic reflexes present.  Sensory: Intact to light touch Gait: Normal.   Labs: I have reviewed the data as listed    Component Value Date/Time   NA 131 (L)  07/14/2020 1047   NA 137 11/07/2019 0000   NA 138 08/18/2014 1551   K 3.7 07/14/2020 1047   K 3.7 08/18/2014 1551   CL 97 07/14/2020 1047   CL 100 11/30/2012 1029   CO2 23 07/14/2020 1047   CO2 27 08/18/2014 1551   GLUCOSE 93 07/14/2020 1047   GLUCOSE 117 08/18/2014 1551   GLUCOSE 91 11/30/2012 1029   BUN 20 09/30/2020 1412   BUN 23 (A) 11/07/2019 0000   BUN 17.2 08/18/2014 1551   CREATININE 1.36 (H) 09/30/2020 1412   CREATININE 1.29 (H) 06/26/2017 1603   CREATININE 1.3 (H) 08/18/2014 1551   CALCIUM 9.6 07/14/2020 1047   CALCIUM 9.1 08/18/2014 1551   PROT 7.5 07/14/2020 1047   PROT 7.3 05/31/2018 1545   PROT 7.1 08/18/2014 1551   ALBUMIN 4.5 07/14/2020 1047   ALBUMIN 4.6 05/31/2018 1545   ALBUMIN 4.0 08/18/2014 1551   AST 26 07/14/2020 1047   AST 19 08/18/2014 1551   ALT 14 07/14/2020 1047   ALT 21 08/18/2014 1551   ALKPHOS 155 (H) 07/14/2020 1047   ALKPHOS 137 08/18/2014 1551   BILITOT 0.9 07/14/2020 1047   BILITOT 0.6 05/31/2018 1545   BILITOT 0.46 08/18/2014 1551   GFRNONAA 44 (L) 09/30/2020 1412   GFRAA 57 (L) 10/09/2019 1458   Lab Results  Component Value Date   WBC 6.7 07/14/2020   NEUTROABS 4.0 07/14/2020   HGB 12.5 07/14/2020   HCT 37.3 07/14/2020   MCV 79.0 07/14/2020   PLT  215.0 07/14/2020    Imaging: [facility] Clinician Interpretation: I have personally reviewed the CNS images as listed.  My interpretation, in the context of the patient's clinical presentation, is treatment effect vs true progression  MR Brain W Wo Contrast  Result Date: 01/15/2021 CLINICAL DATA:  Meningioma ([place]) D32.9 (ICD-10-CM). Benign neoplasm, brain/CNS; 3T SRS protocol. Follow-up of treated Meningioma. Pt also has hx of MS. EXAM: MRI HEAD WITHOUT AND WITH CONTRAST TECHNIQUE: Multiplanar, multiecho pulse sequences of the brain and surrounding structures were obtained without and with intravenous contrast. CONTRAST:  69m GADAVIST GADOBUTROL 1 MMOL/ML IV SOLN COMPARISON:  MRI of the  brain October 01, 2020. FINDINGS: Brain: No acute infarction, hemorrhage, hydrocephalus, or extra-axial collection. No significant change of the left sphenoid wing meningioma measuring 16 x 15 x 14 mm (15 x 15 x 14 on prior). There is also no significant change of the left and tori meningioma measuring approximately 9 x 7 mm (9 x 7 mm on prior). There is no significant mass effect on the brain parenchyma. Scattered foci of T2 hyperintensity are seen within the white matter of the cerebral hemispheres and in the left cerebellar hemisphere, consistent with the clinical diagnosis of multiple sclerosis. New T2 hyperintense lesions with contrast enhancement are seen within the left side of the pons and mesial left temporal lobe. 10 mm pineal cyst is unchanged. Vascular: Normal flow voids. Skull and upper cervical spine: Diffuse decrease of the T1 signal within the calvarium, unchanged. Sinuses/Orbits: Negative. Other: None. IMPRESSION: 1. Stable appearance of 16 mm left sphenoid wing and 9 mm left tentorial meningiomas. 2. New T2 hyperintense lesions with contrast enhancement on the left side of the pons and in the medial left temporal may represent posttreatment changes. However, possibility of active demyelination plaques cannot be excluded. Continued follow-up suggested. Electronically Signed   By: KPedro EarlsM.D.   On: 01/15/2021 13:36     Assessment/Plan [place]eningioma ([facility]  Multiple sclerosis ([facility]  We appreciate the opportunity to participate in the care of [place]  She presents today with clinical syndrome consistent with multifocal meningioma, now treated with radiosurgery.  Clinically she is at baseline today.   The lesions are stable following treatment in April.  However, there are foci of contrast enhancement on either side of the tentorium lesion, within the pons medially and temporal lobe laterally.  We reviewed these images in detail in brain/spine tumor board with  neuroradiology; suspect etiology here is blood brain barrier disruption as effect of radiation rather than any organic progression of disease.  These do not appear to be symptomatic at this time  We will continue to follow closely with neuroimaging.  She is agreeable to this plan.  We ask that RSHERLY BRODBECKreturn to clinic in 2 months following next brain MRI, or sooner as needed.  We spent twenty additional minutes teaching regarding the natural history, biology, and historical experience in the treatment of brain tumors. We then discussed in detail the current recommendations for therapy focusing on the mode of administration, mechanism of action, anticipated toxicities, and quality of life issues associated with this plan. We also provided teaching sheets for the patient to take home as an additional resource.  All questions were answered. The patient knows to call the clinic with any problems, questions or concerns. No barriers to learning were detected.  The total time spent in the encounter was 45 minutes and more than 50% was on counseling and review of test  results   Ventura Sellers, MD Medical Director of Neuro-Oncology [facility] at [place] 01/19/21 9:03 AM

## 2021-01-20 ENCOUNTER — Encounter (HOSPITAL_BASED_OUTPATIENT_CLINIC_OR_DEPARTMENT_OTHER)
Admission: RE | Admit: 2021-01-20 | Discharge: 2021-01-20 | Disposition: A | Discharge status: Home / Self Care | Payer: Commercial Managed Care - PPO | Source: Ambulatory Visit | Attending: Plastic Surgery | Admitting: Plastic Surgery

## 2021-01-20 DIAGNOSIS — Z01818 Encounter for other preprocedural examination: Secondary | ICD-10-CM | POA: Insufficient documentation

## 2021-01-20 DIAGNOSIS — I10 Essential (primary) hypertension: Secondary | ICD-10-CM | POA: Insufficient documentation

## 2021-01-20 DIAGNOSIS — D329 Benign neoplasm of meninges, unspecified: Secondary | ICD-10-CM | POA: Diagnosis not present

## 2021-01-20 LAB — BASIC METABOLIC PANEL
Anion gap: 9 (ref 5–15)
BUN: 14 mg/dL (ref 8–23)
CO2: 24 mmol/L (ref 22–32)
Calcium: 9 mg/dL (ref 8.9–10.3)
Chloride: 99 mmol/L (ref 98–111)
Creatinine, Ser: 1.04 mg/dL — ABNORMAL HIGH (ref 0.44–1.00)
GFR, Estimated: >60 mL/min (ref >60)
Glucose, Bld: 115 mg/dL — ABNORMAL HIGH (ref 70–99)
Potassium: 3.8 mmol/L (ref 3.5–5.1)
Sodium: 132 mmol/L — ABNORMAL LOW (ref 135–145)

## 2021-01-20 MED ORDER — CHLORHEXIDINE GLUCONATE CLOTH 2 % EX PADS: 6.0000 | MEDICATED_PAD | Freq: Once | CUTANEOUS | Status: DC

## 2021-01-20 NOTE — Progress Notes (Signed)

## 2021-01-20 NOTE — H&P (Signed)
Subjective:     Patient ID: Julie Ross is a 60 y.o. female.   HPI   Here for follow up discussion right breast reduction.    Underwent left mastectomy 2013 for final T1, N1, M0 stage II ER/PR+, Her2+ disease. Completed adjuvant chemotherapy and Herceptin. Course complicated by bacteremia and infection left knee during chemotherapy. Underwent I&D knee as well as port removal. Port then replaced to finish treatment. Completed adjuvant radiation. Completed Femara treatment.   Per patient she was counseled by her Orthopaedic surgeon that if she has another infection she risks losing her leg.   PA PGM with breast ca. Genetics negative   Prior to mastectomy 34 C. Tried using prosthesis in past and did not like it in part due to size and not currently using. Bothered by asymmetry chest.     Weight down 25 lb over last year. Goal 180 lb   MMG Right 11/2020 through her [facility] normal.   PMH significant for MS, OSA/CPAP, HTN, HLD, meningioma, tumor removal pelvis age 50, multiple NMSC with multiple Mohs procedures. Followed by Neurology, has been off treatment for years for MS. Meningiomas noted 2017 MRI as part of follow up MS. MRI of the brain 12//2021 showed stable MS lesions, but meningiomas had increased in size compared to 2019. Underwent stereotactic radiosurgery 10/06/2020 for the meningiomas (left sphenoid wing and left tentorial). F/u brain MRI 12/2020 showed stable appearance of left sphenoid wing and left tentorial meningiomas. New T2 hyperintense lesions left side of the pons and in the medial left temporal read as possible posttreatment changes vs active demyelination plaques. Discussed at spine tumor board with neuroradiology and per chart suspect etiology here is blood brain barrier disruption as effect of radiation rather than any organic progression of disease. Plan repeat imaging 2 months.   Disabled secondary to [place]. This presented as optic neuritis. Lives with spouse.     Review of Systems    Objective:   Physical Exam Cardiovascular:     Rate and Rhythm: Normal rate and regular rhythm.     Heart sounds: Normal heart sounds.  Pulmonary:     Effort: Pulmonary effort is normal.     Breath sounds: Normal breath sounds.  Chest:  Breasts:     Right: No axillary adenopathy.     Left: No axillary adenopathy.      Lymphadenopathy:     Upper Body:     Right upper body: No axillary adenopathy.     Left upper body: No axillary adenopathy.  Skin:    Comments: Fitzpatrick 2     Chest Left chest transverse scar absent breast some redundancy skin No masses bilateral right chest port scar Right breast grade 3 ptosis       Assessment:     History left breast cancer s/p mastectomy, SLN Adjuvant chemotherapy radiation    Plan:       Plan right breast reduction. We have previously discussed options for left breast reconstruction and patient declines. Patient reports goal to wear sports bra alone without prosthesis; desires as small right breast as possible.   Reviewed reduction with anchor type scars, OP surgery, drains, post operative visits and limitations, recovery. Diminished sensation nipple and breast skin, risk of nipple loss, wound healing problems, asymmetry, incidental carcinoma, changes with wt gain/loss, aging, unacceptable cosmetic appearance reviewed. Additional risks including but not limited to bleeding seroma hematoma need for additional procedures damage to adjacent structures, infection, blood clots in legs or lungs  reviewed.   Drain teaching completed.  Rx for oxycodone and Zofran given. Notes PONV and also required Narcan after back surgery- encouraged her to discuss this with anesthesia on day of surgery.

## 2021-01-21 ENCOUNTER — Other Ambulatory Visit: Payer: Self-pay | Admitting: Radiation Therapy

## 2021-01-25 ENCOUNTER — Ambulatory Visit (HOSPITAL_BASED_OUTPATIENT_CLINIC_OR_DEPARTMENT_OTHER): Payer: Commercial Managed Care - PPO | Admitting: Certified Registered"

## 2021-01-25 ENCOUNTER — Ambulatory Visit (HOSPITAL_BASED_OUTPATIENT_CLINIC_OR_DEPARTMENT_OTHER)
Admission: RE | Admit: 2021-01-25 | Discharge: 2021-01-25 | Disposition: A | Discharge status: Home / Self Care | Payer: Commercial Managed Care - PPO | Attending: Plastic Surgery | Admitting: Plastic Surgery

## 2021-01-25 ENCOUNTER — Encounter (HOSPITAL_BASED_OUTPATIENT_CLINIC_OR_DEPARTMENT_OTHER): Payer: Self-pay | Admitting: Plastic Surgery

## 2021-01-25 ENCOUNTER — Other Ambulatory Visit: Payer: Self-pay

## 2021-01-25 ENCOUNTER — Encounter (HOSPITAL_BASED_OUTPATIENT_CLINIC_OR_DEPARTMENT_OTHER)
Admission: RE | Disposition: A | Discharge status: Home / Self Care | Payer: Self-pay | Source: Home / Self Care | Attending: Plastic Surgery

## 2021-01-25 DIAGNOSIS — N651 Disproportion of reconstructed breast: Secondary | ICD-10-CM | POA: Diagnosis not present

## 2021-01-25 DIAGNOSIS — I10 Essential (primary) hypertension: Secondary | ICD-10-CM | POA: Insufficient documentation

## 2021-01-25 DIAGNOSIS — Z923 Personal history of irradiation: Secondary | ICD-10-CM | POA: Insufficient documentation

## 2021-01-25 DIAGNOSIS — Z9012 Acquired absence of left breast and nipple: Secondary | ICD-10-CM | POA: Insufficient documentation

## 2021-01-25 DIAGNOSIS — G4733 Obstructive sleep apnea (adult) (pediatric): Secondary | ICD-10-CM | POA: Diagnosis not present

## 2021-01-25 DIAGNOSIS — G35 Multiple sclerosis: Secondary | ICD-10-CM | POA: Diagnosis not present

## 2021-01-25 DIAGNOSIS — E785 Hyperlipidemia, unspecified: Secondary | ICD-10-CM | POA: Diagnosis not present

## 2021-01-25 DIAGNOSIS — Z853 Personal history of malignant neoplasm of breast: Secondary | ICD-10-CM | POA: Insufficient documentation

## 2021-01-25 HISTORY — PX: BREAST REDUCTION SURGERY: SHX8

## 2021-01-25 HISTORY — DX: Other complications of anesthesia, initial encounter: T88.59XA

## 2021-01-25 SURGERY — MAMMOPLASTY, REDUCTION
Anesthesia: General | Site: Breast | Laterality: Right

## 2021-01-25 MED ORDER — OXYCODONE HCL 5 MG PO TABS
ORAL_TABLET | ORAL | Status: AC
  Filled 2021-01-25: qty 1

## 2021-01-25 MED ORDER — ONDANSETRON HCL 4 MG/2ML IJ SOLN
INTRAMUSCULAR | Status: DC | PRN
  Administered 2021-01-25: 4 mg via INTRAVENOUS

## 2021-01-25 MED ORDER — CEFAZOLIN SODIUM-DEXTROSE 2-4 GM/100ML-% IV SOLN
INTRAVENOUS | Status: AC
  Filled 2021-01-25: qty 100

## 2021-01-25 MED ORDER — PROPOFOL 10 MG/ML IV BOLUS
INTRAVENOUS | Status: DC | PRN
  Administered 2021-01-25: 150 mg via INTRAVENOUS

## 2021-01-25 MED ORDER — HYDROMORPHONE HCL 1 MG/ML IJ SOLN
INTRAMUSCULAR | Status: AC
  Filled 2021-01-25: qty 0.5

## 2021-01-25 MED ORDER — FENTANYL CITRATE (PF) 100 MCG/2ML IJ SOLN
INTRAMUSCULAR | Status: AC
  Filled 2021-01-25: qty 2

## 2021-01-25 MED ORDER — LIDOCAINE HCL (CARDIAC) PF 100 MG/5ML IV SOSY
PREFILLED_SYRINGE | INTRAVENOUS | Status: DC | PRN
  Administered 2021-01-25: 60 mg via INTRAVENOUS

## 2021-01-25 MED ORDER — MIDAZOLAM HCL 5 MG/5ML IJ SOLN
INTRAMUSCULAR | Status: DC | PRN
  Administered 2021-01-25: 2 mg via INTRAVENOUS

## 2021-01-25 MED ORDER — ROCURONIUM BROMIDE 100 MG/10ML IV SOLN
INTRAVENOUS | Status: DC | PRN
  Administered 2021-01-25: 80 mg via INTRAVENOUS

## 2021-01-25 MED ORDER — PROMETHAZINE HCL 25 MG/ML IJ SOLN: 6.2500 mg | INTRAMUSCULAR | Status: DC | PRN

## 2021-01-25 MED ORDER — SUGAMMADEX SODIUM 200 MG/2ML IV SOLN
INTRAVENOUS | Status: DC | PRN
  Administered 2021-01-25: 200 mg via INTRAVENOUS

## 2021-01-25 MED ORDER — HYDROMORPHONE HCL 1 MG/ML IJ SOLN
0.2500 mg | INTRAMUSCULAR | Status: DC | PRN
  Administered 2021-01-25: 0.5 mg via INTRAVENOUS
  Administered 2021-01-25 (×2): 0.25 mg via INTRAVENOUS
  Administered 2021-01-25: 0.5 mg via INTRAVENOUS

## 2021-01-25 MED ORDER — EPHEDRINE SULFATE 50 MG/ML IJ SOLN
INTRAMUSCULAR | Status: DC | PRN
  Administered 2021-01-25: 10 mg via INTRAVENOUS
  Administered 2021-01-25: 20 mg via INTRAVENOUS

## 2021-01-25 MED ORDER — BUPIVACAINE HCL (PF) 0.5 % IJ SOLN
INTRAMUSCULAR | Status: DC | PRN
  Administered 2021-01-25: 20 mL

## 2021-01-25 MED ORDER — FENTANYL CITRATE (PF) 100 MCG/2ML IJ SOLN
INTRAMUSCULAR | Status: DC | PRN
  Administered 2021-01-25 (×2): 50 ug via INTRAVENOUS

## 2021-01-25 MED ORDER — GABAPENTIN 300 MG PO CAPS
300.0000 mg | ORAL_CAPSULE | ORAL | Status: AC
  Administered 2021-01-25: 300 mg via ORAL

## 2021-01-25 MED ORDER — CELECOXIB 200 MG PO CAPS
200.0000 mg | ORAL_CAPSULE | ORAL | Status: AC
  Administered 2021-01-25: 200 mg via ORAL

## 2021-01-25 MED ORDER — PROPOFOL 500 MG/50ML IV EMUL
INTRAVENOUS | Status: DC | PRN
  Administered 2021-01-25: 25 ug/kg/min via INTRAVENOUS

## 2021-01-25 MED ORDER — CELECOXIB 200 MG PO CAPS
ORAL_CAPSULE | ORAL | Status: AC
  Filled 2021-01-25: qty 1

## 2021-01-25 MED ORDER — ACETAMINOPHEN 500 MG PO TABS
1000.0000 mg | ORAL_TABLET | ORAL | Status: AC
  Administered 2021-01-25: 1000 mg via ORAL

## 2021-01-25 MED ORDER — ACETAMINOPHEN 500 MG PO TABS
ORAL_TABLET | ORAL | Status: AC
  Filled 2021-01-25: qty 2

## 2021-01-25 MED ORDER — OXYCODONE HCL 5 MG PO TABS
5.0000 mg | ORAL_TABLET | Freq: Once | ORAL | Status: AC | PRN
  Administered 2021-01-25: 5 mg via ORAL

## 2021-01-25 MED ORDER — MIDAZOLAM HCL 2 MG/2ML IJ SOLN
INTRAMUSCULAR | Status: AC
  Filled 2021-01-25: qty 2

## 2021-01-25 MED ORDER — 0.9 % SODIUM CHLORIDE (POUR BTL) OPTIME
TOPICAL | Status: DC | PRN
  Administered 2021-01-25: 200 mL

## 2021-01-25 MED ORDER — CEFAZOLIN SODIUM-DEXTROSE 2-4 GM/100ML-% IV SOLN
2.0000 g | INTRAVENOUS | Status: AC
  Administered 2021-01-25: 2 g via INTRAVENOUS

## 2021-01-25 MED ORDER — OXYCODONE HCL 5 MG/5ML PO SOLN
5.0000 mg | Freq: Once | ORAL | Status: AC | PRN
Start: 2021-01-25 — End: 2021-01-25

## 2021-01-25 MED ORDER — GABAPENTIN 300 MG PO CAPS
ORAL_CAPSULE | ORAL | Status: AC
  Filled 2021-01-25: qty 1

## 2021-01-25 MED ORDER — LACTATED RINGERS IV SOLN: INTRAVENOUS | Status: DC

## 2021-01-25 MED ORDER — AMISULPRIDE (ANTIEMETIC) 5 MG/2ML IV SOLN: 10.0000 mg | Freq: Once | INTRAVENOUS | Status: DC | PRN

## 2021-01-25 MED ORDER — MEPERIDINE HCL 25 MG/ML IJ SOLN: 6.2500 mg | INTRAMUSCULAR | Status: DC | PRN

## 2021-01-25 MED ORDER — BUPIVACAINE HCL (PF) 0.5 % IJ SOLN
INTRAMUSCULAR | Status: AC
  Filled 2021-01-25: qty 30

## 2021-01-25 SURGICAL SUPPLY — 57 items
ADH SKN CLS APL DERMABOND .7 (GAUZE/BANDAGES/DRESSINGS) ×1
APL PRP STRL LF DISP 70% ISPRP (MISCELLANEOUS) ×2
APPLIER CLIP 9.375 MED OPEN (MISCELLANEOUS)
APR CLP MED 9.3 20 MLT OPN (MISCELLANEOUS)
BINDER BREAST 3XL (GAUZE/BANDAGES/DRESSINGS) IMPLANT
BINDER BREAST LRG (GAUZE/BANDAGES/DRESSINGS) IMPLANT
BINDER BREAST MEDIUM (GAUZE/BANDAGES/DRESSINGS) IMPLANT
BINDER BREAST XLRG (GAUZE/BANDAGES/DRESSINGS) IMPLANT
BINDER BREAST XXLRG (GAUZE/BANDAGES/DRESSINGS) ×1 IMPLANT
BLADE SURG 10 STRL SS (BLADE) ×6 IMPLANT
BNDG GAUZE ELAST 4 BULKY (GAUZE/BANDAGES/DRESSINGS) ×2 IMPLANT
CANISTER SUCT 1200ML W/VALVE (MISCELLANEOUS) ×2 IMPLANT
CHLORAPREP W/TINT 26 (MISCELLANEOUS) ×4 IMPLANT
CLIP APPLIE 9.375 MED OPEN (MISCELLANEOUS) IMPLANT
CLIP TI MEDIUM 6 (CLIP) IMPLANT
COVER BACK TABLE 60X90IN (DRAPES) ×2 IMPLANT
COVER MAYO STAND STRL (DRAPES) ×2 IMPLANT
DERMABOND ADVANCED (GAUZE/BANDAGES/DRESSINGS) ×1
DERMABOND ADVANCED .7 DNX12 (GAUZE/BANDAGES/DRESSINGS) ×1 IMPLANT
DRAIN CHANNEL 15F RND FF W/TCR (WOUND CARE) ×1 IMPLANT
DRAPE TOP ARMCOVERS (MISCELLANEOUS) ×2 IMPLANT
DRAPE U-SHAPE 76X120 STRL (DRAPES) ×2 IMPLANT
DRAPE UTILITY XL STRL (DRAPES) ×2 IMPLANT
DRSG PAD ABDOMINAL 8X10 ST (GAUZE/BANDAGES/DRESSINGS) ×4 IMPLANT
ELECT COATED BLADE 2.86 ST (ELECTRODE) ×2 IMPLANT
ELECT REM PT RETURN 9FT ADLT (ELECTROSURGICAL) ×2
ELECTRODE REM PT RTRN 9FT ADLT (ELECTROSURGICAL) ×1 IMPLANT
EVACUATOR SILICONE 100CC (DRAIN) ×1 IMPLANT
GLOVE SURG HYDRASOFT LTX SZ5.5 (GLOVE) ×2 IMPLANT
GOWN STRL REUS W/ TWL LRG LVL3 (GOWN DISPOSABLE) ×2 IMPLANT
GOWN STRL REUS W/TWL LRG LVL3 (GOWN DISPOSABLE) ×4
MARKER SKIN DUAL TIP RULER LAB (MISCELLANEOUS) IMPLANT
NDL HYPO 25X1 1.5 SAFETY (NEEDLE) IMPLANT
NEEDLE HYPO 25X1 1.5 SAFETY (NEEDLE) ×2 IMPLANT
NS IRRIG 1000ML POUR BTL (IV SOLUTION) ×2 IMPLANT
PACK BASIN DAY SURGERY FS (CUSTOM PROCEDURE TRAY) ×2 IMPLANT
PENCIL SMOKE EVACUATOR (MISCELLANEOUS) ×2 IMPLANT
PIN SAFETY STERILE (MISCELLANEOUS) ×2 IMPLANT
SHEET MEDIUM DRAPE 40X70 STRL (DRAPES) ×3 IMPLANT
SLEEVE SCD COMPRESS KNEE MED (STOCKING) ×2 IMPLANT
SPONGE T-LAP 18X18 ~~LOC~~+RFID (SPONGE) ×6 IMPLANT
STAPLER INSORB 30 2030 C-SECTI (MISCELLANEOUS) IMPLANT
STAPLER VISISTAT 35W (STAPLE) ×2 IMPLANT
SUT ETHILON 2 0 FS 18 (SUTURE) ×2 IMPLANT
SUT MNCRL AB 4-0 PS2 18 (SUTURE) ×6 IMPLANT
SUT PDS AB 2-0 CT2 27 (SUTURE) IMPLANT
SUT VIC AB 3-0 PS1 18 (SUTURE) ×8
SUT VIC AB 3-0 PS1 18XBRD (SUTURE) ×4 IMPLANT
SUT VICRYL 4-0 PS2 18IN ABS (SUTURE) ×3 IMPLANT
SYR BULB IRRIG 60ML STRL (SYRINGE) ×2 IMPLANT
SYR CONTROL 10ML LL (SYRINGE) ×1 IMPLANT
TAPE MEASURE VINYL STERILE (MISCELLANEOUS) IMPLANT
TOWEL GREEN STERILE FF (TOWEL DISPOSABLE) ×3 IMPLANT
TRAY FOLEY W/BAG SLVR 14FR LF (SET/KITS/TRAYS/PACK) IMPLANT
TUBE CONNECTING 20X1/4 (TUBING) ×2 IMPLANT
UNDERPAD 30X36 HEAVY ABSORB (UNDERPADS AND DIAPERS) ×4 IMPLANT
YANKAUER SUCT BULB TIP NO VENT (SUCTIONS) ×2 IMPLANT

## 2021-01-25 NOTE — Transfer of Care (Signed)
Immediate Anesthesia Transfer of Care Note  Patient: Julie MUDRAK  Procedure(s) Performed: MAMMARY REDUCTION  (BREAST) (Right: Breast)  Patient Location: PACU  Anesthesia Type:General  Level of Consciousness: drowsy and patient cooperative  Airway & Oxygen Therapy: Patient Spontanous Breathing and Patient connected to face mask oxygen  Post-op Assessment: Report given to RN and Post -op Vital signs reviewed and stable  Post vital signs: Reviewed and stable  Last Vitals:  Vitals Value Taken Time  BP    Temp    Pulse 70 01/25/21 1522  Resp 11 01/25/21 1522  SpO2 100 % 01/25/21 1522  Vitals shown include unvalidated device data.  Last Pain:  Vitals:   01/25/21 1202  TempSrc: Oral  PainSc: 0-No pain         Complications: No notable events documented.

## 2021-01-25 NOTE — Anesthesia Preprocedure Evaluation (Signed)
Anesthesia Evaluation  Patient identified by MRN, date of birth, ID band Patient awake    Reviewed: Allergy & Precautions, NPO status , Patient's Chart, lab work & pertinent test results  History of Anesthesia Complications Negative for: history of anesthetic complications  Airway Mallampati: III  TM Distance: >3 FB Neck ROM: Full    Dental  (+) Dental Advisory Given   Pulmonary sleep apnea , neg recent URI,    breath sounds clear to auscultation       Cardiovascular hypertension, Pt. on medications (-) angina(-) Past MI and (-) CHF  Rhythm:Regular     Neuro/Psych PSYCHIATRIC DISORDERS Anxiety Depression  Neuromuscular disease    GI/Hepatic Neg liver ROS, GERD  Medicated and Controlled,  Endo/Other  Hypothyroidism   Renal/GU negative Renal ROS     Musculoskeletal  (+) Arthritis ,   Abdominal (+) + obese,   Peds  Hematology  (+) anemia ,   Anesthesia Other Findings   Reproductive/Obstetrics                             Anesthesia Physical  Anesthesia Plan  ASA: III  Anesthesia Plan: General   Post-op Pain Management:    Induction: Intravenous  PONV Risk Score and Plan: 3 and Ondansetron, Dexamethasone, Midazolam and Treatment may vary due to age or medical condition  Airway Management Planned: Oral ETT  Additional Equipment: None  Intra-op Plan:   Post-operative Plan: Extubation in OR  Informed Consent: I have reviewed the patients History and Physical, chart, labs and discussed the procedure including the risks, benefits and alternatives for the proposed anesthesia with the patient or authorized representative who has indicated his/her understanding and acceptance.     Dental advisory given  Plan Discussed with: CRNA and Surgeon  Anesthesia Plan Comments:         Anesthesia Quick Evaluation

## 2021-01-25 NOTE — Op Note (Signed)
Operative Note   DATE OF OPERATION: 8.8.22  LOCATION: [facility]-outpatient  SURGICAL DIVISION: Plastic Surgery  PREOPERATIVE DIAGNOSES:  1. History breast cancer 2. Acquired absence left breast  POSTOPERATIVE DIAGNOSES:  same  PROCEDURE:  Right breast reduction  SURGEON: Irene Limbo MD MBA  ASSISTANT: none  ANESTHESIA:  General.   EBL: 50 ml  COMPLICATIONS: None immediate.   INDICATIONS FOR PROCEDURE:  The patient, Julie Ross, is a 60 y.o. female born on 1961-03-21, is here for right breast reduction following left mastectomy.   FINDINGS: Right reduction 608 g  DESCRIPTION OF PROCEDURE:  The patient was marked standing in the preoperative area to mark sternal notch, chest midline, anterior axillary line, inframammary fold. The location of new nipple areolar complex was marked at level of inframammary fold on anterior surface breast by palpation. With aid of Wise pattern marker, location of new nipple areolar complex and vertical limbs (7 cm) were marked by displacement of breast along meridian. The patient was taken to the operating room. SCDs were placed and IV antibiotics were given. The patient's operative site was prepped and draped in a sterile fashion. A time out was performed and all information was confirmed to be correct.    Over right  breast, superomedial pedicle marked and nipple areolar complex incised with 45 mm diameter marker. Pedicle deepithlialized and developed to chest wall. Breast tissue resected over lower pole. Medial and lateral flaps developed. Additional superior pole and lateral chest wall tissue excised. Breast cavity irrigated and hemostasis obtained. Local anesthetic infiltrated throughout breast. 15 Fr JP placed and secured with 2-0 nylon. Closure completed with 3-0 vicryl to approximate dermis along inframammary fold and vertical limb. NAC inset with 4-0 vicryl in dermis. Skin closure completed with 4-0 monocryl subcuticular throughout.  Tissue adhesive applied. Dry dressing and breast binder applied.   The patient was allowed to wake from anesthesia, extubated and taken to the recovery room in satisfactory condition.   SPECIMENS: right breast tissue  DRAINS: 15 Fr JP in right breast

## 2021-01-25 NOTE — Interval H&P Note (Signed)
History and Physical Interval Note:  01/25/2021 12:19 PM  Julie Ross  has presented today for surgery, with the diagnosis of history breast cancer, acquired absence breast, history therapeutic radiation.  The various methods of treatment have been discussed with the patient and family. After consideration of risks, benefits and other options for treatment, the patient has consented to  Procedure(s): MAMMARY REDUCTION  (BREAST) (Right) as a surgical intervention.  The patient's history has been reviewed, patient examined, no change in status, stable for surgery.  I have reviewed the patient's chart and labs.  Questions were answered to the patient's satisfaction.     Arnoldo Hooker Oletha Tolson

## 2021-01-25 NOTE — Discharge Instructions (Signed)
  Post Anesthesia Home Care Instructions  Activity: Get plenty of rest for the remainder of the day. A responsible individual must stay with you for 24 hours following the procedure.  For the next 24 hours, DO NOT: -Drive a car -Paediatric nurse -Drink alcoholic beverages -Take any medication unless instructed by your physician -Make any legal decisions or sign important papers.  Meals: Start with liquid foods such as gelatin or soup. Progress to regular foods as tolerated. Avoid greasy, spicy, heavy foods. If nausea and/or vomiting occur, drink only clear liquids until the nausea and/or vomiting subsides. Call your physician if vomiting continues.  Special Instructions/Symptoms: Your throat may feel dry or sore from the anesthesia or the breathing tube placed in your throat during surgery. If this causes discomfort, gargle with warm salt water. The discomfort should disappear within 24 hours.  If you had a scopolamine patch placed behind your ear for the management of post- operative nausea and/or vomiting:  1. The medication in the patch is effective for 72 hours, after which it should be removed.  Wrap patch in a tissue and discard in the trash. Wash hands thoroughly with soap and water. 2. You may remove the patch earlier than 72 hours if you experience unpleasant side effects which may include dry mouth, dizziness or visual disturbances. 3. Avoid touching the patch. Wash your hands with soap and water after contact with the patch.     No Tylenol or Ibuprofen until after 6pm.

## 2021-01-25 NOTE — Anesthesia Procedure Notes (Signed)
Procedure Name: Intubation Date/Time: 01/25/2021 1:16 PM Performed by: Signe Colt, CRNA Pre-anesthesia Checklist: Patient identified, Emergency Drugs available, Suction available and Patient being monitored Patient Re-evaluated:Patient Re-evaluated prior to induction Oxygen Delivery Method: Circle system utilized Preoxygenation: Pre-oxygenation with 100% oxygen Induction Type: IV induction Ventilation: Mask ventilation without difficulty Laryngoscope Size: Mac and 3 Grade View: Grade II Tube type: Oral Tube size: 7.0 mm Number of attempts: 1 Airway Equipment and Method: Stylet and Oral airway Placement Confirmation: ETT inserted through vocal cords under direct vision, positive ETCO2 and breath sounds checked- equal and bilateral Secured at: 21 cm Tube secured with: Tape Dental Injury: Teeth and Oropharynx as per pre-operative assessment

## 2021-01-25 NOTE — Anesthesia Postprocedure Evaluation (Signed)
Anesthesia Post Note  Patient: Miki Kniess Hathaway  Procedure(s) Performed: MAMMARY REDUCTION  (BREAST) (Right: Breast)     Patient location during evaluation: PACU Anesthesia Type: General Level of consciousness: awake and alert Pain management: pain level controlled Vital Signs Assessment: post-procedure vital signs reviewed and stable Respiratory status: spontaneous breathing, nonlabored ventilation and respiratory function stable Cardiovascular status: blood pressure returned to baseline and stable Postop Assessment: no apparent nausea or vomiting Anesthetic complications: no   No notable events documented.  Last Vitals:  Vitals:   01/25/21 1600 01/25/21 1615  BP: 140/73 132/82  Pulse: 61 64  Resp: (!) 8 17  Temp:    SpO2: 100% 95%    Last Pain:  Vitals:   01/25/21 1617  TempSrc:   PainSc: [place]

## 2021-01-26 ENCOUNTER — Encounter (HOSPITAL_BASED_OUTPATIENT_CLINIC_OR_DEPARTMENT_OTHER): Payer: Self-pay | Admitting: Plastic Surgery

## 2021-01-27 LAB — SURGICAL PATHOLOGY

## 2021-02-02 ENCOUNTER — Other Ambulatory Visit: Payer: Self-pay | Admitting: Neurology

## 2021-02-02 MED ORDER — CLONAZEPAM 1 MG PO TABS: 1.0000 mg | ORAL_TABLET | Freq: Every day | ORAL | 1 refills | Status: DC

## 2021-02-11 ENCOUNTER — Encounter: Payer: Self-pay | Admitting: Allergy

## 2021-02-11 ENCOUNTER — Other Ambulatory Visit: Payer: Self-pay | Admitting: Family Medicine

## 2021-02-11 ENCOUNTER — Ambulatory Visit: Payer: Commercial Managed Care - PPO | Admitting: Allergy

## 2021-02-11 ENCOUNTER — Other Ambulatory Visit: Payer: Self-pay

## 2021-02-11 VITALS — BP 132/72 | HR 59 | Temp 97.6°F | Resp 18 | Ht 66.0 in | Wt 203.8 lb

## 2021-02-11 DIAGNOSIS — I10 Essential (primary) hypertension: Secondary | ICD-10-CM

## 2021-02-11 DIAGNOSIS — T781XXD Other adverse food reactions, not elsewhere classified, subsequent encounter: Secondary | ICD-10-CM

## 2021-02-11 DIAGNOSIS — J300 Vasomotor rhinitis: Secondary | ICD-10-CM | POA: Diagnosis not present

## 2021-02-11 DIAGNOSIS — K9049 Malabsorption due to intolerance, not elsewhere classified: Secondary | ICD-10-CM | POA: Diagnosis not present

## 2021-02-11 DIAGNOSIS — E785 Hyperlipidemia, unspecified: Secondary | ICD-10-CM

## 2021-02-11 NOTE — Patient Instructions (Addendum)
-  food allergy testing is negative to milk, shrimp, rice, [place], chicken, tomato, mushroom, onion, corn, strawberry, cantaloupe, pineapple, chocolate, black pepper -continue avoidance of tomato products at this time -recommend obtain serum IgE levels to see if we can pick up the food allergen in bloodstream. If also negative then symptoms are most likely due to intolerance -have access to self-injectable epinephrine Epipen 0.'3mg'$  at all times -follow emergency action plan in case of allergic reaction  - we have discussed the following in regards to foods:   Allergy: food allergy is when you have eaten a food, developed an allergic reaction after eating the food and have IgE to the food (positive food testing either by skin testing or blood testing).  Food allergy could lead to life threatening symptoms  Sensitivity: occurs when you have IgE to a food (positive food testing either by skin testing or blood testing) but is a food you eat without any issues.  This is not an allergy and we recommend keeping the food in the diet  Intolerance: this is when you have negative testing by either skin testing or blood testing thus not allergic but the food causes symptoms (like belly pain, bloating, diarrhea etc) with ingestion.  These foods should be avoided to prevent symptoms.    -it is also likely that you have component of vasomotor rhinitis which is when you can develop nasal drainage/congestion with eating (not a food allergy) -recommend you try nasal Atrovent 2 sprays each nostril prior to eating.  Can use this spray 3-4 times a day as needed  -continue to avoid stinging insect.  -as above you have access to epipen in case of allergic reaction  Follow-up 4 months or sooner if needed

## 2021-02-11 NOTE — Progress Notes (Signed)
New Patient Note  RE: Julie Ross MRN: [id number] DOB: January 20, 1961 Date of Office Visit: 02/11/2021   Primary care provider: Midge Minium, MD  Chief Complaint: food allergy?  History of present illness: Julie Ross is a 60 y.o. female presenting today for evaluation of food allergy evaluation.   She is concerned she might be allergic to tomatoes.  She states tomato products like ketchup she develops throat clearing often after ingestion. She also states with [place] she has to throat clear.  She is noting no other symptoms (no respiratory, GI or CV related symptoms).  She has changed a lot of her foods to not include tomato products.   She doesn't eat fresh tomatoes as she states she doesn't like them.  She does report chocolate makes her nauseous.   She states she has eaten ice cream and felt nauseous was well.   No history of asthma or eczema.  She has history environmental allergy to aeroallergen to items in [place].  She states she doesn't have any allergy symptoms here in the [place] area.   She states she was stung by a yellow jacket on her hand and her whole hand swelled up.  She has an epipen.    Review of systems: Review of Systems  Constitutional: Negative.   HENT: Negative.    Eyes: Negative.   Respiratory: Negative.    Cardiovascular: Negative.   Gastrointestinal: Negative.   Musculoskeletal: Negative.   Skin: Negative.   Neurological: Negative.    All other systems negative unless noted above in HPI  Past medical history: Past Medical History:  Diagnosis Date   Anemia    h/o - ? bld. transfusion - more recent- ?2013   Anxiety    uses xanax mainly for sleep   Arthritis    knees    Basal cell carcinoma 01/11/2005   rim left nostril-,mohs   BCC (basal cell carcinoma) 01/11/2005   right cheek mid-mohs   BCC (basal cell carcinoma) 09/12/2006   right forehead -mohs   BCC (basal cell carcinoma) 01/19/2016   right upper arm -cx20f   BCC  (basal cell carcinoma) 02/18/2019   forehead-mohs   Breast cancer ([facility] 11/2011   lul/ER+PR+, x1 lymph node    Cancer (HCC)    squamous cell skin cancer x7   Complication of anesthesia    Depression    GERD (gastroesophageal reflux disease)    H/O bone density study 03/2011   H/O colonoscopy    H/O echocardiogram    last one 02/2013, due to chemotherapy   Heart burn    Hx of radiation therapy 04/17/12- 06/04/12   left chest wall, axilla, L supraclavicular fossa 4500 cGy, left mastectomy scar/chest wall 5940 cGy   Hypertension    Hypothyroidism    Meningioma ([facility] 08/2020   Multiple sclerosis ([facility]    Ne[facility]romuscular disorder ([facility]    MS TX WITH CYMBALTA    Personal history of chemotherapy    Personal history of radiation therapy    SCC (squamous cell carcinoma) 01/11/2005   chest mid   Sleep apnea    no CPAP   Squamous cell carcinoma of skin 01/11/2005   over left lip   Wears glasses     Past surgical history: Past Surgical History:  Procedure Laterality Date   APPLICATION OF ROBOTIC ASSISTANCE FOR SPINAL PROCEDURE N/A 03/12/2019   Procedure: APPLICATION OF ROBOTIC ASSISTANCE FOR SPINAL PROCEDURE;  Surgeon: OJudith Part MD;  [facility] [facility]  Service: Neurosurgery;  Laterality: N/A;   BREAST REDUCTION SURGERY Right 01/25/2021   Procedure: MAMMARY REDUCTION  (BREAST);  Surgeon: Irene Limbo, MD;  Location: [place];  Service: Plastics;  Laterality: Right;   CESAREAN SECTION     X2    COLONOSCOPY     ESOPHAGOGASTRODUODENOSCOPY     FLEXIBLE SIGMOIDOSCOPY     I & D KNEE WITH POLY EXCHANGE  03/02/2012   Procedure: IRRIGATION AND DEBRIDEMENT KNEE WITH POLY EXCHANGE;  Surgeon: Alta Corning, MD;  Location: [place];  Service: Orthopedics;  Laterality: Left;  I&D of total knee with Possible Poly Exchange    JOINT REPLACEMENT  07/22/2011   left knee- multiple surgeries    KNEE ARTHROSCOPY     LT KNEE 04/2011   KNEE ARTHROSCOPY  06/20/1982   rt    MASTECTOMY     MASTECTOMY W/ SENTINEL NODE BIOPSY  12/19/2011   Procedure: MASTECTOMY WITH SENTINEL LYMPH NODE BIOPSY;  Surgeon: Haywood Lasso, MD;  Location: [place];  Service: General;  Laterality: Left;  left breast and left axilla    MOHS SURGERY     right face   PORT-A-CATH REMOVAL  03/06/2012   Procedure: REMOVAL PORT-A-CATH;  Surgeon: Odis Hollingshead, MD;  Location: [place];  Service: General;  Laterality: N/A;   PORT-A-CATH REMOVAL Right 08/22/2012   Procedure: REMOVAL PORT-A-CATH;  Surgeon: Edward Jolly, MD;  Location: [place];  Service: General;  Laterality: Right;   PORTACATH PLACEMENT  01/16/2012   Procedure: INSERTION PORT-A-CATH;  Surgeon: Haywood Lasso, MD;  Location: [place];  Service: General;  Laterality: Right;  Porta Cath Placement    PORTACATH PLACEMENT  05/01/2012   Procedure: INSERTION PORT-A-CATH;  Surgeon: Haywood Lasso, MD;  Location: [place];  Service: General;  Laterality: Right;  right internal jugular port-a-cath insertion   TEE WITHOUT CARDIOVERSION  03/06/2012   Procedure: TRANSESOPHAGEAL ECHOCARDIOGRAM (TEE);  Surgeon: Josue Hector, MD;  Location: [place];  Service: Cardiovascular;  Laterality: N/A;  Rm. [id number]   TONSILLECTOMY     TOTAL KNEE ARTHROPLASTY  08/15/2011   Procedure: TOTAL KNEE ARTHROPLASTY; lft Surgeon: Alta Corning, MD;  Location: [place];  Service: Orthopedics;  Laterality: Left;  RIGHT KNEE CORTIZONE INJECTION   TOTAL KNEE ARTHROPLASTY Left 08/18/2012   Procedure:  Irrigation and debridement of LEFT total knee;  Removal of total knee parts; Implant of spacers;  Surgeon: Kerin Salen, MD;  Location: [address];  Service: Orthopedics;  Laterality: Left;   TOTAL KNEE ARTHROPLASTY Right 08/12/2013   TOTAL KNEE ARTHROPLASTY Right 08/12/2013   Procedure: RIGHT TOTAL KNEE ARTHROPLASTY;  Surgeon: Kerin Salen, MD;  Location: [place];  Service: Orthopedics;  Laterality: Right;   TOTAL KNEE REVISION  Left 04/19/2013   Procedure: TOTAL KNEE REVISION AND REMOVAL CEMENT SPACER;  Surgeon: Kerin Salen, MD;  Location: [place];  Service: Orthopedics;  Laterality: Left;   TRANSFORAMINAL LUMBAR INTERBODY FUSION (TLIF) WITH PEDICLE SCREW FIXATION 1 LEVEL Bilateral 03/12/2019   Procedure: Lumbar four-five Open transforaminal lumbar interbody fusion with Lumbar four-five laminectomy, bilateral Lumbar four-five Pedicle screw placement;  Surgeon: Judith Part, MD;  Location: [place];  Service: Neurosurgery;  Laterality: Bilateral;   TUMOR REMOVAL     FROM PELVIS AGE 63    Family history:  Family History  Problem Relation Age of Onset   Breast cancer Paternal Aunt        dx in her  22s   Heart disease Maternal Grandfather    Lung cancer Maternal Uncle        smoker   Breast cancer Paternal Grandmother        dx in her 90s   Ovarian cancer Paternal Aunt        dx in her 60s   Arthritis Mother    Hypertension Mother    Heart disease Mother        CHF   Dementia Father    Diabetes type II Father     Social history: She lives in a home with carpeting with electric heating and central and fan cooling.  There are dogs at home.  There is a cat outside the home.  There is no concern for water damage, mildew or roaches in the home.  She is disabled and thus does not work.  She denies a smoking history.   Medication List: Current Outpatient Medications  Medication Sig Dispense Refill   Ascorbic Acid (VITAMIN C PO) Take by mouth.     atorvastatin (LIPITOR) 20 MG tablet TAKE 1 TABLET DAILY 90 tablet 1   baclofen (LIORESAL) 10 MG tablet TAKE 1 TABLET THREE TIMES A DAY AS NEEDED FOR MUSCLE SPASMS 270 tablet 3   buPROPion (WELLBUTRIN XL) 150 MG 24 hr tablet TAKE 1 TABLET DAILY 90 tablet 3   clonazePAM (KLONOPIN) 1 MG tablet Take 1 tablet (1 mg total) by mouth at bedtime. 90 tablet 1   EPINEPHrine 0.3 mg/0.3 mL IJ SOAJ injection Inject 0.3 mg into the muscle as needed for anaphylaxis. 1 each 3    gabapentin (NEURONTIN) 600 MG tablet TAKE 1 TABLET THREE TIMES A DAY 270 tablet 3   HYDROcodone-acetaminophen (NORCO/VICODIN) 5-325 MG tablet Take 1 tablet by mouth 2 (two) times daily as needed for pain.     KLOR-CON M20 20 MEQ tablet TAKE 1 TABLET DAILY 90 tablet 1   levothyroxine (SYNTHROID) 88 MCG tablet TAKE 1 TABLET DAILY BEFORE BREAKFAST 90 tablet 3   losartan-hydrochlorothiazide (HYZAAR) 100-25 MG tablet TAKE 1 TABLET EVERY MORNING BEFORE BREAKFAST 90 tablet 1   omeprazole (PRILOSEC) 40 MG capsule TAKE 1 CAPSULE DAILY 90 capsule 3   ondansetron (ZOFRAN) 4 MG tablet Take 1 tablet (4 mg total) by mouth every 8 (eight) hours as needed for nausea or vomiting. 20 tablet 0   No current facility-administered medications for this visit.    Known medication allergies: Allergies  Allergen Reactions   Bee Venom Anaphylaxis   Fentanyl Other (See Comments)    Very low BP, decrease LOC-received Narcan-in 2020     Physical examination: Blood pressure 132/72, pulse (!) 59, temperature 97.6 F (36.4 C), temperature source Temporal, resp. rate 18, height '5\' 6"'$  (1.676 m), weight 203 lb 12.8 oz (92.4 kg), last menstrual period 12/13/2011, SpO2 97 %.  General: Alert, interactive, in no acute distress. HEENT: PERRLA, TMs pearly gray, turbinates non-edematous without discharge, post-pharynx non erythematous. Neck: Supple without lymphadenopathy. Lungs: Clear to auscultation without wheezing, rhonchi or rales. {no increased work of breathing. CV: Normal S1, S2 without murmurs. Abdomen: Nondistended, nontender. Skin: Warm and dry, without lesions or rashes. Extremities:  No clubbing, cyanosis or edema. Neuro:   Grossly intact.  Diagnositics/Labs:  Allergy testing: select food allergy skin prick testing is negative with a positive histamine control. Allergy testing results were read and interpreted by provider, documented by clinical staff.   Assessment and plan: Adverse food reaction Food  intolerance Vasomotor rhinitis   -food allergy testing  is negative to milk, shrimp, rice, [place], chicken, tomato, mushroom, onion, corn, strawberry, cantaloupe, pineapple, chocolate, black pepper -continue avoidance of tomato products at this time -recommend obtain serum IgE levels to see if we can pick up the food allergen in bloodstream. If also negative then symptoms are most likely due to intolerance -have access to self-injectable epinephrine Epipen 0.'3mg'$  at all times -follow emergency action plan in case of allergic reaction  - we have discussed the following in regards to foods:   Allergy: food allergy is when you have eaten a food, developed an allergic reaction after eating the food and have IgE to the food (positive food testing either by skin testing or blood testing).  Food allergy could lead to life threatening symptoms  Sensitivity: occurs when you have IgE to a food (positive food testing either by skin testing or blood testing) but is a food you eat without any issues.  This is not an allergy and we recommend keeping the food in the diet  Intolerance: this is when you have negative testing by either skin testing or blood testing thus not allergic but the food causes symptoms (like belly pain, bloating, diarrhea etc) with ingestion.  These foods should be avoided to prevent symptoms.    -it is also likely that you have component of vasomotor rhinitis which is when you can develop nasal drainage/congestion with eating (not a food allergy) -recommend you try nasal Atrovent 2 sprays each nostril prior to eating.  Can use this spray 3-4 times a day as needed  -continue to avoid stinging insect.  -as above you have access to epipen in case of allergic reaction  Follow-up 4 months or sooner if needed   I appreciate the opportunity to take part in Julie Ross's care. Please do not hesitate to contact me with questions.  Sincerely,   Prudy Feeler, MD Allergy/Immunology [facility]

## 2021-02-16 ENCOUNTER — Other Ambulatory Visit: Payer: Self-pay | Admitting: Family Medicine

## 2021-02-16 LAB — ALLERGEN, ONION, F48: Allergen Onion IgE: <0.1 kU/L

## 2021-02-16 LAB — ALLERGEN, RICE, F9: Allergen Rice IgE: <0.1 kU/L

## 2021-02-16 LAB — ALLERGEN, STRAWBERRY, F44: Allergen Strawberry IgE: 0.11 kU/L — AB

## 2021-02-16 LAB — ALLERGEN, TOMATO F25: Allergen Tomato, IgE: <0.1 kU/L

## 2021-02-16 LAB — ALLERGEN, CORN F8: Allergen Corn, IgE: <0.1 kU/L

## 2021-02-16 LAB — ALLERGEN, PINEAPPLE, F210: Pineapple IgE: <0.1 kU/L

## 2021-02-16 LAB — ALLERGEN CANTALOUPE: Allergen Melon IgE: <0.1 kU/L

## 2021-02-16 LAB — ALLERGEN, SHRIMP F24: Shrimp IgE: <0.1 kU/L

## 2021-02-16 LAB — TRYPTASE: Tryptase: 6.3 ug/L (ref 2.2–13.2)

## 2021-02-16 LAB — ALLERGEN CHOCOLATE: Chocolate/Cacao IgE: <0.1 kU/L

## 2021-02-16 LAB — ALLERGEN, MUSHROOM, RF212: Mushroom IgE: 0.17 kU/L — AB

## 2021-02-16 LAB — ALLERGEN MILK: Milk IgE: <0.1 kU/L

## 2021-02-16 LAB — ALLERGEN, CHICKEN F83: Chicken IgE: <0.1 kU/L

## 2021-02-16 LAB — ALLERGEN, TURKEY, F284: Allergen Turkey IgE: <0.1 kU/L

## 2021-02-16 LAB — ALLERGEN, BLACK PEPPER,F280: Allergen Black Pepper IgE: <0.1 kU/L

## 2021-02-19 ENCOUNTER — Other Ambulatory Visit: Payer: Self-pay | Admitting: Family Medicine

## 2021-02-25 ENCOUNTER — Ambulatory Visit: Payer: Commercial Managed Care - PPO | Admitting: Family Medicine

## 2021-03-01 ENCOUNTER — Other Ambulatory Visit: Payer: Self-pay | Admitting: *Deleted

## 2021-03-01 DIAGNOSIS — G4733 Obstructive sleep apnea (adult) (pediatric): Secondary | ICD-10-CM

## 2021-03-19 ENCOUNTER — Other Ambulatory Visit: Payer: Self-pay

## 2021-03-19 ENCOUNTER — Ambulatory Visit
Admission: RE | Admit: 2021-03-19 | Discharge: 2021-03-19 | Disposition: A | Discharge status: Home / Self Care | Payer: Commercial Managed Care - PPO | Source: Ambulatory Visit | Attending: Internal Medicine | Admitting: Internal Medicine

## 2021-03-19 DIAGNOSIS — D329 Benign neoplasm of meninges, unspecified: Secondary | ICD-10-CM

## 2021-03-19 MED ORDER — GADOBENATE DIMEGLUMINE 529 MG/ML IV SOLN
20.0000 mL | Freq: Once | INTRAVENOUS | Status: AC | PRN
  Administered 2021-03-19: 20 mL via INTRAVENOUS

## 2021-03-22 ENCOUNTER — Inpatient Hospital Stay: Payer: Commercial Managed Care - PPO

## 2021-03-22 ENCOUNTER — Other Ambulatory Visit: Payer: Self-pay

## 2021-03-22 ENCOUNTER — Inpatient Hospital Stay: Payer: Commercial Managed Care - PPO | Attending: Internal Medicine | Admitting: Internal Medicine

## 2021-03-22 VITALS — BP 149/87 | HR 60 | Temp 98.0°F | Resp 16 | Wt 203.4 lb

## 2021-03-22 DIAGNOSIS — D329 Benign neoplasm of meninges, unspecified: Secondary | ICD-10-CM | POA: Diagnosis not present

## 2021-03-22 DIAGNOSIS — C773 Secondary and unspecified malignant neoplasm of axilla and upper limb lymph nodes: Secondary | ICD-10-CM | POA: Diagnosis not present

## 2021-03-22 DIAGNOSIS — C50412 Malignant neoplasm of upper-outer quadrant of left female breast: Secondary | ICD-10-CM | POA: Diagnosis not present

## 2021-03-22 DIAGNOSIS — D320 Benign neoplasm of cerebral meninges: Secondary | ICD-10-CM | POA: Diagnosis not present

## 2021-03-22 DIAGNOSIS — G35 Multiple sclerosis: Secondary | ICD-10-CM | POA: Insufficient documentation

## 2021-03-22 DIAGNOSIS — C50411 Malignant neoplasm of upper-outer quadrant of right female breast: Secondary | ICD-10-CM | POA: Diagnosis not present

## 2021-03-22 DIAGNOSIS — Z79811 Long term (current) use of aromatase inhibitors: Secondary | ICD-10-CM | POA: Insufficient documentation

## 2021-03-22 DIAGNOSIS — Z170 Estrogen receptor positive status [ER+]: Secondary | ICD-10-CM | POA: Insufficient documentation

## 2021-03-22 NOTE — Progress Notes (Signed)
[facility] at [address], [place] [address] [phone]   New Patient Evaluation  Date of Service: 03/22/21 Patient Name: Julie Ross Patient MRN: [id number] Patient DOB: [birth year] Provider: Ventura Sellers, MD  Identifying Statement:  Julie Ross is a 60 y.o. female with  multifocal  meningioma who presents for initial consultation and evaluation.    Referring Provider: Midge Minium, MD [address] [place],  [place] [address]  Oncologic History: Oncology History  Breast cancer of upper-outer quadrant of right female breast ([place])  11/22/2011 Initial Diagnosis   Cancer of upper-outer quadrant of female breast   12/19/2011 Surgery   Left mastectomy with SLN biopsy 2 foci of invasive ductal carcinoma 0.8 and 0.6 cm low-grade ER PR positive HER-2 positive ratio 3.27 Ki-67 11% one lymph node positive out of 4 (T1, N1, M0 stage II)   02/16/2012 - 04/14/2013 Chemotherapy   Taxotere, carboplatin, Herceptin x2 cycles complicated by bacteremia and left knee sepsis requiring revision of the knee. Herceptin maintenance every 3 weeks. Herceptin maintenance completed 04/14/2013   04/17/2012 - 06/04/2012 Radiation Therapy   Radiation therapy to the breast   02/14/2013 Genetic Testing   Negative genetic testing on the Breast/Ovarian Cancer panel.  The Breast/Ovarian gene panel offered by [facility] includes sequencing and rearrangement analysis for the following 21 genes:  ATM, BARD1, BRCA1, BRCA2, BRIP1, CDH1, CHEK2, EPCAM, FANCC, MLH1, MSH2, MSH6, NBN, PALB2, PMS2, PTEN, STK11, RAD51C, RAD51D, TP53, and XRCC2.   The report date is February 14, 2013.   03/13/2013 -  Anti-estrogen oral therapy   Letrozole 2.5 mg once daily     CNS Oncologic History 10/15/20: SRS to two meningomas, L sphenoid wing and L tentorium Julie Ross)  Interval History: Julie Ross presents today for follow up.  She denies any new or progressive neurologic  deficits.  No seizures or headaches.  (H+P) Patient presents today for follow up after completing post-SRS MRI study.  She describes no new or progressive neurologic deficits.  No facial weakness, numbness, or double vision.  Continues to follow with Dr. Felecia Shelling for history of MS.  Functional status is independent, doesn't require assistance with ADLs.  Medications: Current Outpatient Medications on File Prior to Visit  Medication Sig Dispense Refill   Ascorbic Acid (VITAMIN C PO) Take by mouth.     atorvastatin (LIPITOR) 20 MG tablet TAKE 1 TABLET DAILY 90 tablet 1   baclofen (LIORESAL) 10 MG tablet TAKE 1 TABLET THREE TIMES A DAY AS NEEDED FOR MUSCLE SPASMS 270 tablet 3   buPROPion (WELLBUTRIN XL) 150 MG 24 hr tablet TAKE 1 TABLET DAILY 90 tablet 3   clonazePAM (KLONOPIN) 1 MG tablet Take 1 tablet (1 mg total) by mouth at bedtime. 90 tablet 1   EPINEPHrine 0.3 mg/0.3 mL IJ SOAJ injection Inject 0.3 mg into the muscle as needed for anaphylaxis. 1 each 3   gabapentin (NEURONTIN) 600 MG tablet TAKE 1 TABLET THREE TIMES A DAY 270 tablet 3   HYDROcodone-acetaminophen (NORCO/VICODIN) 5-325 MG tablet Take 1 tablet by mouth 2 (two) times daily as needed for pain.     KLOR-CON M20 20 MEQ tablet TAKE 1 TABLET DAILY 90 tablet 1   levothyroxine (SYNTHROID) 88 MCG tablet TAKE 1 TABLET DAILY BEFORE BREAKFAST 90 tablet 1   losartan-hydrochlorothiazide (HYZAAR) 100-25 MG tablet TAKE 1 TABLET EVERY MORNING BEFORE BREAKFAST 90 tablet 1   omeprazole (PRILOSEC) 40 MG capsule TAKE 1  CAPSULE DAILY 90 capsule 1   ondansetron (ZOFRAN) 4 MG tablet Take 1 tablet (4 mg total) by mouth every 8 (eight) hours as needed for nausea or vomiting. 20 tablet 0   sertraline (ZOLOFT) 25 MG tablet Take 25 mg by mouth daily.     No current facility-administered medications on file prior to visit.    Allergies:  Allergies  Allergen Reactions   Bee Venom Anaphylaxis   Fentanyl Other (See Comments)    Very low BP, decrease  LOC-received Narcan-in 2020   Past Medical History:  Past Medical History:  Diagnosis Date   Anemia    h/o - ? bld. transfusion - more recent- ?2013   Anxiety    uses xanax mainly for sleep   Arthritis    knees    Basal cell carcinoma 01/11/2005   rim left nostril-,mohs   BCC (basal cell carcinoma) 01/11/2005   right cheek mid-mohs   BCC (basal cell carcinoma) 09/12/2006   right forehead -mohs   BCC (basal cell carcinoma) 01/19/2016   right upper arm -[id number]   BCC (basal cell carcinoma) 02/18/2019   forehead-mohs   Breast cancer ([facility] 11/2011   lul/ER+PR+, x1 lymph node    Cancer (HCC)    squamous cell skin cancer x7   Complication of anesthesia    Depression    GERD (gastroesophageal reflux disease)    H/O bone density study 03/2011   H/O colonoscopy    H/O echocardiogram    last one 02/2013, due to chemotherapy   Heart burn    Hx of radiation therapy 04/17/12- 06/04/12   left chest wall, axilla, L supraclavicular fossa 4500 cGy, left mastectomy scar/chest wall 5940 cGy   Hypertension    Hypothyroidism    Meningioma ([facility] 08/2020   Multiple sclerosis ([facility]    Neuromuscular disorder ([facility]    MS TX WITH CYMBALTA    Personal history of chemotherapy    Personal history of radiation therapy    SCC (squamous cell carcinoma) 01/11/2005   chest mid   Sleep apnea    no CPAP   Squamous cell carcinoma of skin 01/11/2005   over left lip   Wears glasses    Past Surgical History:  Past Surgical History:  Procedure Laterality Date   APPLICATION OF ROBOTIC ASSISTANCE FOR SPINAL PROCEDURE N/A 03/12/2019   Procedure: APPLICATION OF ROBOTIC ASSISTANCE FOR SPINAL PROCEDURE;  Surgeon: OJudith Part MD;  [facility]  Service: Neurosurgery;  Laterality: N/A;   BREAST REDUCTION SURGERY Right 01/25/2021   Procedure: MAMMARY REDUCTION  (BREAST);  Surgeon: TIrene Limbo MD;  [facility]  Service: Plastics;  Laterality: Right;   CESAREAN SECTION      X2    COLONOSCOPY     ESOPHAGOGASTRODUODENOSCOPY     FLEXIBLE SIGMOIDOSCOPY     I & D KNEE WITH POLY EXCHANGE  03/02/2012   Procedure: IRRIGATION AND DEBRIDEMENT KNEE WITH POLY EXCHANGE;  Surgeon: JAlta Corning MD;  [facility] [facility]  Service: Orthopedics;  Laterality: Left;  I&D of total knee with Possible Poly Exchange    JOINT REPLACEMENT  07/22/2011   left knee- multiple surgeries    KNEE ARTHROSCOPY     LT KNEE 04/2011   KNEE ARTHROSCOPY  06/20/1982   rt   MASTECTOMY     MASTECTOMY W/ SENTINEL NODE BIOPSY  12/19/2011   Procedure: MASTECTOMY WITH SENTINEL LYMPH NODE BIOPSY;  Surgeon: CHaywood Lasso MD;  [place] [facility]  Service: General;  Laterality: Left;  left breast and left axilla    MOHS SURGERY     right face   PORT-A-CATH REMOVAL  03/06/2012   Procedure: REMOVAL PORT-A-CATH;  Surgeon: Odis Hollingshead, MD;  Location: [facility];  Service: General;  Laterality: N/A;   PORT-A-CATH REMOVAL Right 08/22/2012   Procedure: REMOVAL PORT-A-CATH;  Surgeon: Edward Jolly, MD;  Location: [facility];  Service: General;  Laterality: Right;   PORTACATH PLACEMENT  01/16/2012   Procedure: INSERTION PORT-A-CATH;  Surgeon: Haywood Lasso, MD;  Location: [facility];  Service: General;  Laterality: Right;  Porta Cath Placement    PORTACATH PLACEMENT  05/01/2012   Procedure: INSERTION PORT-A-CATH;  Surgeon: Haywood Lasso, MD;  Location: [facility];  Service: General;  Laterality: Right;  right internal jugular port-a-cath insertion   TEE WITHOUT CARDIOVERSION  03/06/2012   Procedure: TRANSESOPHAGEAL ECHOCARDIOGRAM (TEE);  Surgeon: Josue Hector, MD;  Location: [facility];  Service: Cardiovascular;  Laterality: N/A;  Rm. [id number]   TONSILLECTOMY     TOTAL KNEE ARTHROPLASTY  08/15/2011   Procedure: TOTAL KNEE ARTHROPLASTY; lft Surgeon: Alta Corning, MD;  Location: [facility];  Service: Orthopedics;  Laterality: Left;  RIGHT KNEE CORTIZONE INJECTION   TOTAL  KNEE ARTHROPLASTY Left 08/18/2012   Procedure:  Irrigation and debridement of LEFT total knee;  Removal of total knee parts; Implant of spacers;  Surgeon: Kerin Salen, MD;  Location: [facility];  Service: Orthopedics;  Laterality: Left;   TOTAL KNEE ARTHROPLASTY Right 08/12/2013   TOTAL KNEE ARTHROPLASTY Right 08/12/2013   Procedure: RIGHT TOTAL KNEE ARTHROPLASTY;  Surgeon: Kerin Salen, MD;  Location: [facility];  Service: Orthopedics;  Laterality: Right;   TOTAL KNEE REVISION Left 04/19/2013   Procedure: TOTAL KNEE REVISION AND REMOVAL CEMENT SPACER;  Surgeon: Kerin Salen, MD;  Location: [facility];  Service: Orthopedics;  Laterality: Left;   TRANSFORAMINAL LUMBAR INTERBODY FUSION (TLIF) WITH PEDICLE SCREW FIXATION 1 LEVEL Bilateral 03/12/2019   Procedure: Lumbar four-five Open transforaminal lumbar interbody fusion with Lumbar four-five laminectomy, bilateral Lumbar four-five Pedicle screw placement;  Surgeon: Judith Part, MD;  Location: [facility];  Service: Neurosurgery;  Laterality: Bilateral;   TUMOR REMOVAL     FROM PELVIS AGE 78   Social History:  Social History   Socioeconomic History   Marital status: Married    Spouse name: Not on file   Number of children: 2   Years of education: Not on file   Highest education level: Not on file  Occupational History    Employer: [employer]   Tobacco Use   Smoking status: Never   Smokeless tobacco: Never  Vaping Use   Vaping Use: Never used  Substance and Sexual Activity   Alcohol use: No   Drug use: No   Sexual activity: Yes    Birth control/protection: Post-menopausal  Other Topics Concern   Not on file  Social History Narrative   Not on file   Social Determinants of Health   Financial Resource Strain: Not on file  Food Insecurity: Not on file  Transportation Needs: Not on file  Physical Activity: Not on file  Stress: Not on file  Social Connections: Not on file  Intimate Partner Violence: Not on file   Family History:   Family History  Problem Relation Age of Onset   Breast cancer Paternal Aunt        dx in her 67s   Heart disease Maternal Grandfather  Lung cancer Maternal Uncle        smoker   Breast cancer Paternal Grandmother        dx in her 30s   Ovarian cancer Paternal Aunt        dx in her 61s   Arthritis Mother    Hypertension Mother    Heart disease Mother        CHF   Dementia Father    Diabetes type II Father     Review of Systems: Constitutional: Doesn't report fevers, chills or abnormal weight loss Eyes: Doesn't report blurriness of vision Ears, nose, mouth, throat, and face: Doesn't report sore throat Respiratory: Doesn't report cough, dyspnea or wheezes Cardiovascular: Doesn't report palpitation, chest discomfort  Gastrointestinal:  Doesn't report nausea, constipation, diarrhea GU: Doesn't report incontinence Skin: Doesn't report skin rashes Neurological: Per HPI Musculoskeletal: Doesn't report joint pain Behavioral/Psych: Doesn't report anxiety  Physical Exam: Vitals:   03/22/21 1153  BP: (!) 149/87  Pulse: 60  Resp: 16  Temp: 98 F (36.7 C)  SpO2: 96%    KPS: 90. General: Alert, cooperative, pleasant, in no acute distress Head: Normal EENT: No conjunctival injection or scleral icterus.  Lungs: Resp effort normal Cardiac: Regular rate Abdomen: Non-distended abdomen Skin: No rashes cyanosis or petechiae. Extremities: No clubbing or edema  Neurologic Exam: Mental Status: Awake, alert, attentive to examiner. Oriented to self and environment. Language is fluent with intact comprehension.  Cranial Nerves: Visual acuity is grossly normal. Visual fields are full. Extra-ocular movements intact. No ptosis. Face is symmetric Motor: Tone and bulk are normal. Power is full in both arms and legs. Reflexes are symmetric, no pathologic reflexes present.  Sensory: Intact to light touch Gait: Normal.   Labs: I have reviewed the data as listed    Component Value  Date/Time   NA 132 (L) 01/20/2021 1532   NA 137 11/07/2019 0000   NA 138 08/18/2014 1551   K 3.8 01/20/2021 1532   K 3.7 08/18/2014 1551   CL 99 01/20/2021 1532   CL 100 11/30/2012 1029   CO2 24 01/20/2021 1532   CO2 27 08/18/2014 1551   GLUCOSE 115 (H) 01/20/2021 1532   GLUCOSE 117 08/18/2014 1551   GLUCOSE 91 11/30/2012 1029   BUN 14 01/20/2021 1532   BUN 23 (A) 11/07/2019 0000   BUN 17.2 08/18/2014 1551   CREATININE 1.04 (H) 01/20/2021 1532   CREATININE 1.36 (H) 09/30/2020 1412   CREATININE 1.29 (H) 06/26/2017 1603   CREATININE 1.3 (H) 08/18/2014 1551   CALCIUM 9.0 01/20/2021 1532   CALCIUM 9.1 08/18/2014 1551   PROT 7.5 07/14/2020 1047   PROT 7.3 05/31/2018 1545   PROT 7.1 08/18/2014 1551   ALBUMIN 4.5 07/14/2020 1047   ALBUMIN 4.6 05/31/2018 1545   ALBUMIN 4.0 08/18/2014 1551   AST 26 07/14/2020 1047   AST 19 08/18/2014 1551   ALT 14 07/14/2020 1047   ALT 21 08/18/2014 1551   ALKPHOS 155 (H) 07/14/2020 1047   ALKPHOS 137 08/18/2014 1551   BILITOT 0.9 07/14/2020 1047   BILITOT 0.6 05/31/2018 1545   BILITOT 0.46 08/18/2014 1551   GFRNONAA >60 01/20/2021 1532   GFRNONAA 44 (L) 09/30/2020 1412   GFRAA 57 (L) 10/09/2019 1458   Lab Results  Component Value Date   WBC 6.7 07/14/2020   NEUTROABS 4.0 07/14/2020   HGB 12.5 07/14/2020   HCT 37.3 07/14/2020   MCV 79.0 07/14/2020   PLT 215.0 07/14/2020    Imaging: [facility] Clinician  Interpretation: I have personally reviewed the CNS images as listed.  My interpretation, in the context of the patient's clinical presentation, is stable disease  MR BRAIN W WO CONTRAST  Result Date: 03/21/2021 CLINICAL DATA:  Meningioma. Assess treatment response. History of multiple sclerosis. EXAM: MRI HEAD WITHOUT AND WITH CONTRAST TECHNIQUE: Multiplanar, multiecho pulse sequences of the brain and surrounding structures were obtained without and with intravenous contrast. CONTRAST:  21m MULTIHANCE GADOBENATE DIMEGLUMINE 529 MG/ML IV SOLN  COMPARISON:  MRI 01/14/2021.  MRI 10/01/2020 FINDINGS: Brain: Left sphenoid wing meningioma shows homogeneous enhancement and is unchanged. This mass measures approximately 12 x 17 mm. No edema in the adjacent brain. Left tentorial meningioma unchanged measuring approximately 7 mm in diameter. This projects inferior from the medial tentorium near the trigeminal nerve root entry zone. Previously noted areas of parenchymal enhancement in the left mesial temporal lobe and left lateral pons have improved. There is a small residual area of enhancement in the left lateral pons near the treated meningioma. Enhancement may be related to prior treatment. Ventricle size normal. Periventricular white matter hyperintensities consistent with multiple sclerosis stable. 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 Negative for acute infarct. Vascular: Normal arterial flow voids. Skull and upper cervical spine: Negative Sinuses/Orbits: Negative Other: None IMPRESSION: 1. Stable meningioma left sphenoid wing 2. Stable small meningioma left tentorium. 3. Improvement in punctate areas of enhancement left mesial temporal lobe and left pons likely due to prior radiation therapy. 4. Findings compatible with multiple sclerosis, stable. Electronically Signed   By: CFranchot GalloM.D.   On: 03/21/2021 09:38     Assessment/Plan Meningioma ([facility]  Julie Ross is clinically and radiographically stable today.  Enhancing foci within brain stem have mostly resolved, likely treatment effect.  We ask that Julie RICHWINEreturn to clinic in 12 months following next brain MRI, or sooner as needed.  All questions were answered. The patient knows to call the clinic with any problems, questions or concerns. No barriers to learning were detected.  The total time spent in the encounter was 45 minutes and more than 50% was on counseling and review of test results   ZVentura Sellers MD Medical Director of Neuro-Oncology [facility] W[facility]10/03/22 11:49 AM

## 2021-03-23 ENCOUNTER — Other Ambulatory Visit: Payer: Self-pay | Admitting: Radiation Therapy

## 2021-03-24 ENCOUNTER — Telehealth: Payer: Self-pay | Admitting: Internal Medicine

## 2021-03-24 NOTE — Telephone Encounter (Signed)
Scheduled appt per 10/3 los. Pt is aware.

## 2021-03-31 ENCOUNTER — Other Ambulatory Visit: Payer: Self-pay

## 2021-03-31 ENCOUNTER — Encounter: Payer: Self-pay | Admitting: Family Medicine

## 2021-03-31 ENCOUNTER — Ambulatory Visit (INDEPENDENT_AMBULATORY_CARE_PROVIDER_SITE_OTHER): Payer: Commercial Managed Care - PPO | Admitting: Family Medicine

## 2021-03-31 VITALS — BP 127/70 | HR 71 | Temp 98.2°F | Resp 20 | Ht 66.0 in | Wt 202.4 lb

## 2021-03-31 DIAGNOSIS — F339 Major depressive disorder, recurrent, unspecified: Secondary | ICD-10-CM | POA: Diagnosis not present

## 2021-03-31 DIAGNOSIS — Z923 Personal history of irradiation: Secondary | ICD-10-CM

## 2021-03-31 DIAGNOSIS — E039 Hypothyroidism, unspecified: Secondary | ICD-10-CM | POA: Diagnosis not present

## 2021-03-31 DIAGNOSIS — E785 Hyperlipidemia, unspecified: Secondary | ICD-10-CM

## 2021-03-31 DIAGNOSIS — R011 Cardiac murmur, unspecified: Secondary | ICD-10-CM

## 2021-03-31 DIAGNOSIS — Z23 Encounter for immunization: Secondary | ICD-10-CM

## 2021-03-31 DIAGNOSIS — I10 Essential (primary) hypertension: Secondary | ICD-10-CM

## 2021-03-31 NOTE — Assessment & Plan Note (Signed)
Chronic problem.  Pt reports low energy but she feels this is due to her untreated sleep apnea and not her thyroid.  Check labs.  Adjust meds prn

## 2021-03-31 NOTE — Patient Instructions (Signed)
Schedule your complete physical in 6 months We'll notify you of your lab results and make any changes if needed Continue to work on healthy diet and regular exercise- you can do it! We'll call you with your cardiology appt IF you don't hear back from the sleep people by Monday, call them back Call with any questions or concerns Stay Safe!  Stay Healthy!

## 2021-03-31 NOTE — Assessment & Plan Note (Signed)
Pt had targeted radiation therapy to L chest wall.  She is concerned w/ new murmur and increasing SOB that this had a cardiac effect.  Will refer to Cards for complete evaluation.

## 2021-03-31 NOTE — Assessment & Plan Note (Signed)
Despite a + PHQ today she feels that she is stable and doing well on Wellbutrin and Sertraline.  She is not interested in med changes at this time.

## 2021-03-31 NOTE — Assessment & Plan Note (Signed)
Chronic problem.  Well controlled on Losartan HCTZ.  Check labs due to ARB and diuretic but no anticipated med changes.

## 2021-03-31 NOTE — Progress Notes (Signed)
   Subjective:    Patient ID: Julie Ross, female    DOB: 1960-11-21, 60 y.o.   MRN: [id number]  HPI HTN- chronic problem, on Losartan HCTZ 100/25mg  daily w/ good control.  No CP.  Some SOB w/ exertion.  Pt is concerned due to hx of L chest radiation.  No visual changes.  + chronic HAs  Hyperlipidemia- chronic problem, on Lipitor 20mg  daily.  Denies abd pain, N/V.  Hypothyroid- chronic problem, on Levothyroxine 70mcg daily.  + low energy.  No changes to skin/hair/nails  Depression- currently on Wellbutrin 150mg  daily and Sertraline 25mg  daily.  Despite PHQ score today, pt feels that mood is good and things are stable   Review of Systems For ROS see HPI   This visit occurred during the SARS-CoV-2 public health emergency.  Safety protocols were in place, including screening questions prior to the visit, additional usage of staff PPE, and extensive cleaning of exam room while observing appropriate contact time as indicated for disinfecting solutions.      Objective:   Physical Exam Vitals reviewed.  Constitutional:      General: She is not in acute distress.    Appearance: Normal appearance. She is well-developed. She is not ill-appearing.  HENT:     Head: Normocephalic and atraumatic.  Eyes:     Conjunctiva/sclera: Conjunctivae normal.     Pupils: Pupils are equal, round, and reactive to light.  Neck:     Thyroid: No thyromegaly.  Cardiovascular:     Rate and Rhythm: Normal rate and regular rhythm.     Pulses: Normal pulses.     Heart sounds: Murmur (I-II/VI) heard.  Pulmonary:     Effort: Pulmonary effort is normal. No respiratory distress.     Breath sounds: Normal breath sounds.  Abdominal:     General: There is no distension.     Palpations: Abdomen is soft.     Tenderness: There is no abdominal tenderness.  Musculoskeletal:     Cervical back: Normal range of motion and neck supple.     Right lower leg: No edema.     Left lower leg: No edema.  Lymphadenopathy:      Cervical: No cervical adenopathy.  Skin:    General: Skin is warm and dry.  Neurological:     Mental Status: She is alert and oriented to person, place, and time.  Psychiatric:        Behavior: Behavior normal.          Assessment & Plan:

## 2021-03-31 NOTE — Assessment & Plan Note (Signed)
Chronic problem.  On Lipitor 20mg daily w/o difficulty.  Check labs.  Adjust meds prn  

## 2021-04-01 ENCOUNTER — Encounter: Payer: Self-pay | Admitting: Family Medicine

## 2021-04-01 ENCOUNTER — Other Ambulatory Visit: Payer: Self-pay

## 2021-04-01 DIAGNOSIS — R112 Nausea with vomiting, unspecified: Secondary | ICD-10-CM

## 2021-04-01 LAB — LIPID PANEL
Cholesterol: 153 mg/dL (ref 0–200)
HDL: 67.8 mg/dL (ref >39.00)
NonHDL: 85.33
Total CHOL/HDL Ratio: 2
Triglycerides: 212 mg/dL — ABNORMAL HIGH (ref 0.0–149.0)
VLDL: 42.4 mg/dL — ABNORMAL HIGH (ref 0.0–40.0)

## 2021-04-01 LAB — LDL CHOLESTEROL, DIRECT: Direct LDL: 62 mg/dL

## 2021-04-01 LAB — CBC WITH DIFFERENTIAL/PLATELET
Basophils Absolute: 0 10*3/uL (ref 0.0–0.1)
Basophils Relative: 0.8 % (ref 0.0–3.0)
Eosinophils Absolute: 0.1 10*3/uL (ref 0.0–0.7)
Eosinophils Relative: 2 % (ref 0.0–5.0)
HCT: 38 % (ref 36.0–46.0)
Hemoglobin: 12.6 g/dL (ref 12.0–15.0)
Lymphocytes Relative: 24.8 % (ref 12.0–46.0)
Lymphs Abs: 1.6 10*3/uL (ref 0.7–4.0)
MCHC: 33.2 g/dL (ref 30.0–36.0)
MCV: 78.1 fl (ref 78.0–100.0)
Monocytes Absolute: 0.3 10*3/uL (ref 0.1–1.0)
Monocytes Relative: 5.2 % (ref 3.0–12.0)
Neutro Abs: 4.4 10*3/uL (ref 1.4–7.7)
Neutrophils Relative %: 67.2 % (ref 43.0–77.0)
Platelets: 229 10*3/uL (ref 150.0–400.0)
RBC: 4.86 Mil/uL (ref 3.87–5.11)
RDW: 14.2 % (ref 11.5–15.5)
WBC: 6.5 10*3/uL (ref 4.0–10.5)

## 2021-04-01 LAB — BASIC METABOLIC PANEL
BUN: 18 mg/dL (ref 6–23)
CO2: 25 mEq/L (ref 19–32)
Calcium: 9.5 mg/dL (ref 8.4–10.5)
Chloride: 101 mEq/L (ref 96–112)
Creatinine, Ser: 1.07 mg/dL (ref 0.40–1.20)
GFR: 55.64 mL/min — ABNORMAL LOW (ref >60.00)
Glucose, Bld: 122 mg/dL — ABNORMAL HIGH (ref 70–99)
Potassium: 3.7 mEq/L (ref 3.5–5.1)
Sodium: 137 mEq/L (ref 135–145)

## 2021-04-01 LAB — HEPATIC FUNCTION PANEL
ALT: 14 U/L (ref 0–35)
AST: 19 U/L (ref 0–37)
Albumin: 4.5 g/dL (ref 3.5–5.2)
Alkaline Phosphatase: 144 U/L — ABNORMAL HIGH (ref 39–117)
Bilirubin, Direct: 0.2 mg/dL (ref 0.0–0.3)
Total Bilirubin: 0.9 mg/dL (ref 0.2–1.2)
Total Protein: 7 g/dL (ref 6.0–8.3)

## 2021-04-01 LAB — TSH: TSH: 0.97 u[IU]/mL (ref 0.35–5.50)

## 2021-04-01 MED ORDER — ONDANSETRON HCL 4 MG PO TABS: 4.0000 mg | ORAL_TABLET | Freq: Three times a day (TID) | ORAL | 0 refills | Status: DC | PRN

## 2021-04-02 ENCOUNTER — Other Ambulatory Visit (INDEPENDENT_AMBULATORY_CARE_PROVIDER_SITE_OTHER): Payer: Commercial Managed Care - PPO

## 2021-04-02 DIAGNOSIS — R7309 Other abnormal glucose: Secondary | ICD-10-CM | POA: Diagnosis not present

## 2021-04-02 LAB — HEMOGLOBIN A1C: Hgb A1c MFr Bld: 6.1 % (ref 4.6–6.5)

## 2021-04-02 NOTE — Addendum Note (Signed)
Addended by: Octavio Manns E on: 04/02/2021 03:28 PM   Modules accepted: Orders

## 2021-04-06 ENCOUNTER — Encounter: Payer: Self-pay | Admitting: Internal Medicine

## 2021-04-06 ENCOUNTER — Other Ambulatory Visit: Payer: Self-pay

## 2021-04-06 ENCOUNTER — Ambulatory Visit: Payer: Commercial Managed Care - PPO | Admitting: Internal Medicine

## 2021-04-06 VITALS — BP 117/68 | HR 64 | Ht 66.0 in | Wt 202.6 lb

## 2021-04-06 DIAGNOSIS — Z923 Personal history of irradiation: Secondary | ICD-10-CM

## 2021-04-06 DIAGNOSIS — R072 Precordial pain: Secondary | ICD-10-CM | POA: Diagnosis not present

## 2021-04-06 DIAGNOSIS — R011 Cardiac murmur, unspecified: Secondary | ICD-10-CM | POA: Diagnosis not present

## 2021-04-06 DIAGNOSIS — R0600 Dyspnea, unspecified: Secondary | ICD-10-CM

## 2021-04-06 DIAGNOSIS — I340 Nonrheumatic mitral (valve) insufficiency: Secondary | ICD-10-CM

## 2021-04-06 LAB — BASIC METABOLIC PANEL
BUN/Creatinine Ratio: 14 (ref 12–28)
BUN: 17 mg/dL (ref 8–27)
CO2: 23 mmol/L (ref 20–29)
Calcium: 9.4 mg/dL (ref 8.7–10.3)
Chloride: 95 mmol/L — ABNORMAL LOW (ref 96–106)
Creatinine, Ser: 1.18 mg/dL — ABNORMAL HIGH (ref 0.57–1.00)
Glucose: 99 mg/dL (ref 70–99)
Potassium: 3.9 mmol/L (ref 3.5–5.2)
Sodium: 134 mmol/L (ref 134–144)
eGFR: 52 mL/min/{1.73_m2} — ABNORMAL LOW (ref >59)

## 2021-04-06 MED ORDER — METOPROLOL TARTRATE 50 MG PO TABS: 50.0000 mg | ORAL_TABLET | Freq: Once | ORAL | 0 refills | Status: DC

## 2021-04-06 NOTE — Patient Instructions (Signed)
Medication Instructions:  NO CHANGES  *If you need a refill on your cardiac medications before your next appointment, please call your pharmacy*   Lab Work: BMET (non-fasting) due a few days before your CT test  If you have labs (blood work) drawn today and your tests are completely normal, you will receive your results only by: [place] (if you have MyChart) OR A paper copy in the mail If you have any lab test that is abnormal or we need to change your treatment, we will call you to review the results.   Testing/Procedures: Echocardiogram @ [address]  Cardiac [address] @ [facility] -- once approved with insurance, you will get a call to arrange your test appointment   Follow-Up: At [facility], you and your health needs are our priority.  As part of our continuing mission to provide you with exceptional heart care, we have created designated Provider Care Teams.  These Care Teams include your primary Cardiologist (physician) and Advanced Practice Providers (APPs -  Physician Assistants and Nurse Practitioners) who all work together to provide you with the care you need, when you need it.  We recommend signing up for the patient portal called "MyChart".  Sign up information is provided on this After Visit Summary.  MyChart is used to connect with patients for Virtual Visits (Telemedicine).  Patients are able to view lab/test results, encounter notes, upcoming appointments, etc.  Non-urgent messages can be sent to your provider as well.   To learn more about what you can do with MyChart, go to [email or web address].    Your next appointment:   6-8 week(s) -- after echo & CT test   The format for your next appointment:   In Person  Provider:   You may see Dr. Debara Pickett or one of the following Advanced Practice Providers on your designated Care Team:   Almyra Deforest, PA-C Fabian Sharp, [place] or  Roby Lofts, [place]   Other Instructions   Your  cardiac CT will be scheduled at one of the below locations:   [facility] [address] [place], [place] [address] [phone]  [facility] [address] [place],  [address] [phone]  If scheduled at [facility], please arrive at the [facility] main entrance (entrance A) of [facility] 30 minutes prior to test start time. You can use the FREE valet parking offered at the main entrance (encouraged to control the heart rate for the test) Proceed to the [facility] Radiology Department (first floor) to check-in and test prep.  If scheduled at [facility], please arrive 15 mins early for check-in and test prep.  Please follow these instructions carefully (unless otherwise directed):   On the Night Before the Test: Be sure to Drink plenty of water. Do not consume any caffeinated/decaffeinated beverages or chocolate 12 hours prior to your test. Do not take any antihistamines 12 hours prior to your test.  On the Day of the Test: Drink plenty of water until 1 hour prior to the test. Do not eat any food 4 hours prior to the test. You may take your regular medications prior to the test.  Take metoprolol (Lopressor) two hours prior to test. HOLD Furosemide/Hydrochlorothiazide morning of the test. FEMALES- please wear underwire-free bra if available, avoid dresses & tight clothing  After the Test: Drink plenty of water. After receiving IV contrast, you may experience  a mild flushed feeling. This is normal. On occasion, you may experience a mild rash up to 24 hours after the test. This is not dangerous. If this occurs, you can take Benadryl 25 mg and increase your fluid intake. If you experience trouble breathing, this can be serious. If it is severe call 911 IMMEDIATELY. If it is mild, please call our office. If you take any of these medications: Glipizide/Metformin,  Avandament, Glucavance, please do not take 48 hours after completing test unless otherwise instructed.  Please allow 2-4 weeks for scheduling of routine cardiac CTs. Some insurance companies require a pre-authorization which may delay scheduling of this test.   For non-scheduling related questions, please contact the cardiac imaging nurse navigator should you have any questions/concerns: Marchia Bond, Cardiac Imaging Nurse Navigator Gordy Clement, Cardiac Imaging Nurse Navigator [facility] Direct Office Dial: [phone]   For scheduling needs, including cancellations and rescheduling, please call [place], [phone].

## 2021-04-06 NOTE — Progress Notes (Signed)
OFFICE CONSULT NOTE  Chief Complaint:  Shortness of breath, chest pressure, palpitations  Primary Care Physician: Midge Minium, MD  HPI:  Julie Ross is a 60 y.o. female who is being seen today for the evaluation of shortness of breath, chest pressure, palpitations at the request of Midge Minium, MD. this is a pleasant 60 year old female kindly referred for evaluation of palpitations, chest pressure and shortness of breath with exertion.  She is also experienced fatigue but she reports this is longstanding and she does have a history of MS, not on treatment because of side effects on the medications.  She has a history of left breast cancer and underwent radiation and chemotherapy including antibody therapy requiring recurrent echocardiograms.  Her last echo was in October 2014 that did demonstrate normal systolic function and diastolic function with mild mitral regurgitation.  She was told that she has a mitral murmur which is evidenced by her echo.  She has concerns about whether or not she could have coronary disease as someone told her that she had chest wall radiation and perhaps that could have affected her coronaries.  With regards to family history of mother had congestive heart failure however in a later age and her sister apparently had a "hole in her heart".  Other medical problems include dyslipidemia on therapy with recent lipid profile showing total cholesterol of 153, triglycerides 212, HDL 67 and LDL 57.  She also has hypertension but is well controlled now on Hyzaar and hypothyroidism on levothyroxine.  PMHx:  Past Medical History:  Diagnosis Date   Anemia    h/o - ? bld. transfusion - more recent- ?2013   Anxiety    uses xanax mainly for sleep   Arthritis    knees    Basal cell carcinoma 01/11/2005   rim left nostril-,mohs   BCC (basal cell carcinoma) 01/11/2005   right cheek mid-mohs   BCC (basal cell carcinoma) 09/12/2006   right forehead -mohs   BCC  (basal cell carcinoma) 01/19/2016   right upper arm -[id number]   BCC (basal cell carcinoma) 02/18/2019   forehead-mohs   Breast cancer ([place]) 11/2011   lul/ER+PR+, x1 lymph node    Cancer ([facility])    squamous cell skin cancer x7   Complication of anesthesia    Depression    GERD (gastroesophageal reflux disease)    H/O bone density study 03/2011   H/O colonoscopy    H/O echocardiogram    last one 02/2013, due to chemotherapy   Heart burn    Hx of radiation therapy 04/17/12- 06/04/12   left chest wall, axilla, L supraclavicular fossa 4500 cGy, left mastectomy scar/chest wall 5940 cGy   Hypertension    Hypothyroidism    Meningioma ([place]) 08/2020   Multiple sclerosis ([place])    Neuromuscular disorder ([place])    MS TX WITH CYMBALTA    Personal history of chemotherapy    Personal history of radiation therapy    SCC (squamous cell carcinoma) 01/11/2005   chest mid   Sleep apnea    no CPAP   Squamous cell carcinoma of skin 01/11/2005   over left lip   Wears glasses     Past Surgical History:  Procedure Laterality Date   APPLICATION OF ROBOTIC ASSISTANCE FOR SPINAL PROCEDURE N/A 03/12/2019   Procedure: APPLICATION OF ROBOTIC ASSISTANCE FOR SPINAL PROCEDURE;  Surgeon: Judith Part, MD;  Location: [place];  Service: Neurosurgery;  Laterality: N/A;   BREAST REDUCTION SURGERY Right 01/25/2021  Procedure: MAMMARY REDUCTION  (BREAST);  Surgeon: Irene Limbo, MD;  Location: [facility];  Service: Plastics;  Laterality: Right;   CESAREAN SECTION     X2    COLONOSCOPY     ESOPHAGOGASTRODUODENOSCOPY     FLEXIBLE SIGMOIDOSCOPY     I & D KNEE WITH POLY EXCHANGE  03/02/2012   Procedure: IRRIGATION AND DEBRIDEMENT KNEE WITH POLY EXCHANGE;  Surgeon: Alta Corning, MD;  Location: [place];  Service: Orthopedics;  Laterality: Left;  I&D of total knee with Possible Poly Exchange    JOINT REPLACEMENT  07/22/2011   left knee- multiple surgeries    KNEE ARTHROSCOPY     LT KNEE 04/2011    KNEE ARTHROSCOPY  06/20/1982   rt   MASTECTOMY     MASTECTOMY W/ SENTINEL NODE BIOPSY  12/19/2011   Procedure: MASTECTOMY WITH SENTINEL LYMPH NODE BIOPSY;  Surgeon: Haywood Lasso, MD;  Location: [place];  Service: General;  Laterality: Left;  left breast and left axilla    MOHS SURGERY     right face   PORT-A-CATH REMOVAL  03/06/2012   Procedure: REMOVAL PORT-A-CATH;  Surgeon: Odis Hollingshead, MD;  Location: [facility];  Service: General;  Laterality: N/A;   PORT-A-CATH REMOVAL Right 08/22/2012   Procedure: REMOVAL PORT-A-CATH;  Surgeon: Edward Jolly, MD;  Location: [facility];  Service: General;  Laterality: Right;   PORTACATH PLACEMENT  01/16/2012   Procedure: INSERTION PORT-A-CATH;  Surgeon: Haywood Lasso, MD;  Location: [facility];  Service: General;  Laterality: Right;  Porta Cath Placement    PORTACATH PLACEMENT  05/01/2012   Procedure: INSERTION PORT-A-CATH;  Surgeon: Haywood Lasso, MD;  Location: [facility];  Service: General;  Laterality: Right;  right internal jugular port-a-cath insertion   TEE WITHOUT CARDIOVERSION  03/06/2012   Procedure: TRANSESOPHAGEAL ECHOCARDIOGRAM (TEE);  Surgeon: Josue Hector, MD;  Location: [facility];  Service: Cardiovascular;  Laterality: N/A;  Rm. [id number]   TONSILLECTOMY     TOTAL KNEE ARTHROPLASTY  08/15/2011   Procedure: TOTAL KNEE ARTHROPLASTY; lft Surgeon: Alta Corning, MD;  Location: [facility];  Service: Orthopedics;  Laterality: Left;  RIGHT KNEE CORTIZONE INJECTION   TOTAL KNEE ARTHROPLASTY Left 08/18/2012   Procedure:  Irrigation and debridement of LEFT total knee;  Removal of total knee parts; Implant of spacers;  Surgeon: Kerin Salen, MD;  Location: [facility];  Service: Orthopedics;  Laterality: Left;   TOTAL KNEE ARTHROPLASTY Right 08/12/2013   TOTAL KNEE ARTHROPLASTY Right 08/12/2013   Procedure: RIGHT TOTAL KNEE ARTHROPLASTY;  Surgeon: Kerin Salen, MD;  Location: [facility];  Service: Orthopedics;   Laterality: Right;   TOTAL KNEE REVISION Left 04/19/2013   Procedure: TOTAL KNEE REVISION AND REMOVAL CEMENT SPACER;  Surgeon: Kerin Salen, MD;  Location: [place];  Service: Orthopedics;  Laterality: Left;   TRANSFORAMINAL LUMBAR INTERBODY FUSION (TLIF) WITH PEDICLE SCREW FIXATION 1 LEVEL Bilateral 03/12/2019   Procedure: Lumbar four-five Open transforaminal lumbar interbody fusion with Lumbar four-five laminectomy, bilateral Lumbar four-five Pedicle screw placement;  Surgeon: Judith Part, MD;  Location: [facility];  Service: Neurosurgery;  Laterality: Bilateral;   TUMOR REMOVAL     FROM PELVIS AGE 39    FAMHx:  Family History  Problem Relation Age of Onset   Breast cancer Paternal Aunt        dx in her 25s   Heart disease Maternal Grandfather    Lung cancer Maternal Uncle  smoker   Breast cancer Paternal Grandmother        dx in her 71s   Ovarian cancer Paternal Aunt        dx in her 79s   Arthritis Mother    Hypertension Mother    Heart disease Mother        CHF   Dementia Father    Diabetes type II Father     SOCHx:   reports that she has never smoked. She has never used smokeless tobacco. She reports that she does not drink alcohol and does not use drugs.  ALLERGIES:  Allergies  Allergen Reactions   Bee Venom Anaphylaxis   Fentanyl Other (See Comments)    Very low BP, decrease LOC-received Narcan-in 2020    ROS: Pertinent items noted in HPI and remainder of comprehensive ROS otherwise negative.  HOME MEDS: Current Outpatient Medications on File Prior to Visit  Medication Sig Dispense Refill   Ascorbic Acid (VITAMIN C PO) Take by mouth.     atorvastatin (LIPITOR) 20 MG tablet TAKE 1 TABLET DAILY 90 tablet 1   baclofen (LIORESAL) 10 MG tablet TAKE 1 TABLET THREE TIMES A DAY AS NEEDED FOR MUSCLE SPASMS 270 tablet 3   buPROPion (WELLBUTRIN XL) 150 MG 24 hr tablet TAKE 1 TABLET DAILY 90 tablet 3   clonazePAM (KLONOPIN) 1 MG tablet Take 1 tablet (1 mg total)  by mouth at bedtime. 90 tablet 1   EPINEPHrine 0.3 mg/0.3 mL IJ SOAJ injection Inject 0.3 mg into the muscle as needed for anaphylaxis. 1 each 3   gabapentin (NEURONTIN) 600 MG tablet TAKE 1 TABLET THREE TIMES A DAY 270 tablet 3   KLOR-CON M20 20 MEQ tablet TAKE 1 TABLET DAILY 90 tablet 1   levothyroxine (SYNTHROID) 88 MCG tablet TAKE 1 TABLET DAILY BEFORE BREAKFAST 90 tablet 1   losartan-hydrochlorothiazide (HYZAAR) 100-25 MG tablet TAKE 1 TABLET EVERY MORNING BEFORE BREAKFAST 90 tablet 1   omeprazole (PRILOSEC) 40 MG capsule TAKE 1 CAPSULE DAILY 90 capsule 1   ondansetron (ZOFRAN) 4 MG tablet Take 1 tablet (4 mg total) by mouth every 8 (eight) hours as needed for nausea or vomiting. 20 tablet 0   sertraline (ZOLOFT) 25 MG tablet Take 25 mg by mouth daily.     No current facility-administered medications on file prior to visit.    LABS/IMAGING: No results found. However, due to the size of the patient record, not all encounters were searched. Please check Results Review for a complete set of results. No results found.  LIPID PANEL:    Component Value Date/Time   CHOL 153 03/31/2021 1504   TRIG 212.0 (H) 03/31/2021 1504   HDL 67.80 03/31/2021 1504   CHOLHDL 2 03/31/2021 1504   VLDL 42.4 (H) 03/31/2021 1504   LDLCALC 57 07/14/2020 1047   LDLCALC 64 06/26/2017 1603   LDLDIRECT 62.0 03/31/2021 1504    WEIGHTS: Wt Readings from Last 3 Encounters:  04/06/21 202 lb 9.6 oz (91.9 kg)  03/31/21 202 lb 6.4 oz (91.8 kg)  03/22/21 203 lb 6.4 oz (92.3 kg)    VITALS: BP 117/68   Pulse 64   Ht 5\' 6"  (1.676 m)   Wt 202 lb 9.6 oz (91.9 kg)   LMP 12/13/2011   SpO2 96%   BMI 32.70 kg/m   EXAM: General appearance: alert and no distress Neck: no carotid bruit, no JVD, and thyroid not enlarged, symmetric, no tenderness/mass/nodules Lungs: clear to auscultation bilaterally Heart: regular rate and rhythm, S1,  S2 normal, and systolic murmur: early systolic 2/6, blowing at apex Abdomen:  soft, non-tender; bowel sounds normal; no masses,  no organomegaly Extremities: extremities normal, atraumatic, no cyanosis or edema Pulses: 2+ and symmetric Skin: Skin color, texture, turgor normal. No rashes or lesions Neurologic: Grossly normal Psych: Pleasant  *Examination chaperoned by Sheral Apley, RN.  EKG: Normal sinus rhythm at 64, nonspecific ST changes- personally reviewed  ASSESSMENT: Chest pressure, dyspnea, palpitations History of left breast cancer s/p radiation and chemotherapy Normal LVEF with mild MR (03/2013) Hypertension Dyslipidemia  PLAN: 1.   Julie Ross is describing some chest pressure, dyspnea, palpitations and fatigue.  The fatigue seems chronic although she does have MS.  She is not on any treatments because of side effects related to the medicine.  She is concerned about whether she could have any radiation and related coronary disease.  She has had some worsening shortness of breath particularly walking upstairs.  She reports some intermittent chest pressure.  Blood pressure is well controlled.  Her cholesterol is as well, for mildly elevated triglycerides.  Would recommend a repeat echo to compare to her prior study in 2014.  We will also get a CT coronary angiogram to rule out any significant obstructive coronary disease.  Plan follow-up with me afterwards.  Thanks for the kind referral.  Pixie Casino, MD, FACC, [place] Director of the [facility]  [facility] Diplomate of the American Board of Clinical Lipidology Attending Cardiologist  Direct Dial: [phone]  Fax: [phone]  Website:  [email or web address]  Nadean Corwin Julie Ross 04/06/2021, 10:00 AM

## 2021-04-07 ENCOUNTER — Other Ambulatory Visit: Payer: Self-pay | Admitting: Internal Medicine

## 2021-04-07 ENCOUNTER — Ambulatory Visit: Payer: Commercial Managed Care - PPO | Admitting: Dermatology

## 2021-04-07 ENCOUNTER — Encounter: Payer: Self-pay | Admitting: Dermatology

## 2021-04-07 DIAGNOSIS — Z85828 Personal history of other malignant neoplasm of skin: Secondary | ICD-10-CM

## 2021-04-07 DIAGNOSIS — D485 Neoplasm of uncertain behavior of skin: Secondary | ICD-10-CM

## 2021-04-07 DIAGNOSIS — Z1283 Encounter for screening for malignant neoplasm of skin: Secondary | ICD-10-CM | POA: Diagnosis not present

## 2021-04-07 DIAGNOSIS — C44311 Basal cell carcinoma of skin of nose: Secondary | ICD-10-CM | POA: Diagnosis not present

## 2021-04-07 NOTE — Patient Instructions (Addendum)
[place] 10% Urea cream   Biopsy, Surgery (Curettage) & Surgery (Excision) Aftercare Instructions  1. Okay to remove bandage in 24 hours  2. Wash area with soap and water  3. Apply Vaseline to area twice daily until healed (Not Neosporin)  4. Okay to cover with a Band-Aid to decrease the chance of infection or prevent irritation from clothing; also it's okay to uncover lesion at home.  5. Suture instructions: return to our office in 7-10 or 10-14 days for a nurse visit for suture removal. Variable healing with sutures, if pain or itching occurs call our office. It's okay to shower or bathe 24 hours after sutures are given.  6. The following risks may occur after a biopsy, curettage or excision: bleeding, scarring, discoloration, recurrence, infection (redness, yellow drainage, pain or swelling).  7. For questions, concerns and results call our office at [facility] before 4pm & Friday before 3pm. Biopsy results will be available in 1 week.

## 2021-04-13 ENCOUNTER — Telehealth (HOSPITAL_COMMUNITY): Payer: Self-pay | Admitting: Emergency Medicine

## 2021-04-13 ENCOUNTER — Telehealth: Payer: Self-pay | Admitting: Dermatology

## 2021-04-13 NOTE — Telephone Encounter (Signed)
-----   Message from Lavonna Monarch, MD sent at 04/13/2021  6:24 AM EDT ----- Although this is small and should be highly curable, the closure after removal could affect the appearance of her nasal rim so I strongly prefer this be done by a Mohs surgeon.

## 2021-04-13 NOTE — Telephone Encounter (Signed)
Phone call to patient with her pathology results. Voicemail left for patient to give the office a call back.  

## 2021-04-13 NOTE — Telephone Encounter (Signed)
Release:  [phone]

## 2021-04-13 NOTE — Telephone Encounter (Signed)
Reaching out to patient to offer assistance regarding upcoming cardiac imaging study; pt verbalizes understanding of appt date/time, parking situation and where to check in, pre-test NPO status and medications ordered, and verified current allergies; name and call back number provided for further questions should they arise Marchia Bond RN [facility] [phone] office [phone] cell  R arm only - hx mastectomy  Difficult IV start  50mg  metoprolol

## 2021-04-13 NOTE — Telephone Encounter (Signed)
Patient calling for results.

## 2021-04-13 NOTE — Telephone Encounter (Signed)
Phone call from patient returning our call for her pathology results. Patient aware of results.

## 2021-04-14 ENCOUNTER — Other Ambulatory Visit: Payer: Self-pay

## 2021-04-14 ENCOUNTER — Ambulatory Visit (HOSPITAL_COMMUNITY)
Admission: RE | Admit: 2021-04-14 | Discharge: 2021-04-14 | Disposition: A | Discharge status: Home / Self Care | Payer: Commercial Managed Care - PPO | Source: Ambulatory Visit | Attending: Internal Medicine | Admitting: Internal Medicine

## 2021-04-14 ENCOUNTER — Encounter (HOSPITAL_COMMUNITY): Payer: Self-pay

## 2021-04-14 DIAGNOSIS — R0600 Dyspnea, unspecified: Secondary | ICD-10-CM

## 2021-04-14 DIAGNOSIS — Z923 Personal history of irradiation: Secondary | ICD-10-CM

## 2021-04-14 DIAGNOSIS — R072 Precordial pain: Secondary | ICD-10-CM

## 2021-04-14 NOTE — Progress Notes (Signed)
Patient arrived for CT heart scan. Heart rate on the monitor was in the 30's. Patient stated she didn't feel well and was lightheaded to the point where she wouldn't feel comfortable driving. Patient's husband drove her today. Called and spoke to Dr. Harrell Gave and was advised to have patient reschedule scan without the lopressor and to have the patient consume some caffeine and rest at home today.  Patient and husband verbalized understanding. Nothing further needed at this time.

## 2021-04-19 ENCOUNTER — Other Ambulatory Visit: Payer: Self-pay

## 2021-04-19 ENCOUNTER — Ambulatory Visit (HOSPITAL_COMMUNITY): Payer: Commercial Managed Care - PPO | Attending: Internal Medicine

## 2021-04-19 DIAGNOSIS — R0600 Dyspnea, unspecified: Secondary | ICD-10-CM | POA: Insufficient documentation

## 2021-04-19 DIAGNOSIS — R011 Cardiac murmur, unspecified: Secondary | ICD-10-CM | POA: Diagnosis present

## 2021-04-19 DIAGNOSIS — R072 Precordial pain: Secondary | ICD-10-CM | POA: Diagnosis present

## 2021-04-19 DIAGNOSIS — Z923 Personal history of irradiation: Secondary | ICD-10-CM | POA: Insufficient documentation

## 2021-04-19 LAB — ECHOCARDIOGRAM COMPLETE
Area-P 1/2: 2.71 cm2
S' Lateral: 2.2 cm

## 2021-04-21 ENCOUNTER — Telehealth (HOSPITAL_COMMUNITY): Payer: Self-pay | Admitting: Emergency Medicine

## 2021-04-21 NOTE — Telephone Encounter (Signed)
Reaching out to patient to offer assistance regarding upcoming cardiac imaging study; pt verbalizes understanding of appt date/time, parking situation and where to check in, pre-test NPO status and medications ordered, and verified current allergies; name and call back number provided for further questions should they arise Marchia Bond RN [facility] [phone] office [phone] cell  Pt will not take pre-meds this time around Last attempt she took 50mg  metoprolol and arrived with HR 30s.   R ARM ONLY IV

## 2021-04-22 ENCOUNTER — Ambulatory Visit (HOSPITAL_COMMUNITY): Admission: RE | Admit: 2021-04-22 | Payer: Commercial Managed Care - PPO | Source: Ambulatory Visit

## 2021-04-23 ENCOUNTER — Encounter: Payer: Self-pay | Admitting: Dermatology

## 2021-04-23 ENCOUNTER — Ambulatory Visit (HOSPITAL_COMMUNITY)
Admission: RE | Admit: 2021-04-23 | Discharge: 2021-04-23 | Disposition: A | Discharge status: Home / Self Care | Payer: Commercial Managed Care - PPO | Source: Ambulatory Visit | Attending: Internal Medicine | Admitting: Internal Medicine

## 2021-04-23 ENCOUNTER — Other Ambulatory Visit: Payer: Self-pay

## 2021-04-23 DIAGNOSIS — R072 Precordial pain: Secondary | ICD-10-CM | POA: Insufficient documentation

## 2021-04-23 DIAGNOSIS — R0600 Dyspnea, unspecified: Secondary | ICD-10-CM | POA: Diagnosis present

## 2021-04-23 DIAGNOSIS — Z923 Personal history of irradiation: Secondary | ICD-10-CM | POA: Insufficient documentation

## 2021-04-23 MED ORDER — NITROGLYCERIN 0.4 MG SL SUBL: 0.8000 mg | SUBLINGUAL_TABLET | Freq: Once | SUBLINGUAL | Status: AC

## 2021-04-23 MED ORDER — IOHEXOL 350 MG/ML SOLN
100.0000 mL | Freq: Once | INTRAVENOUS | Status: AC | PRN
  Administered 2021-04-23: 95 mL via INTRAVENOUS

## 2021-04-23 MED ORDER — NITROGLYCERIN 0.4 MG SL SUBL
SUBLINGUAL_TABLET | SUBLINGUAL | Status: AC
  Administered 2021-04-23: 0.8 mg via SUBLINGUAL
  Filled 2021-04-23: qty 2

## 2021-04-23 NOTE — Progress Notes (Signed)
   Follow-Up Visit   Subjective  Julie Ross is a 60 y.o. female who presents for the following: Annual Exam (Only concerns is dry lesions on the forehead, patient has history of bcc scc).  General skin examination Location:  Duration:  Quality:  Associated Signs/Symptoms: Modifying Factors:  Severity:  Timing: Context:   Objective  Well appearing patient in no apparent distress; mood and affect are within normal limits. Torso - Posterior (Back) Waist up skin examination: No atypical pigmented lesions or recurrent nonmobile skin cancer.  Possible new carcinoma right nostril will be biopsied.  Right Ala Nasi Focally eroded 5 mm pearly papule, probable carcinoma         All skin waist up examined.  Areas beneath undergarments not examined.   Assessment & Plan    Encounter for screening for malignant neoplasm of skin Torso - Posterior (Back)  Annual skin examination.  Neoplasm of uncertain behavior of skin Right Ala Nasi  Skin / nail biopsy Type of biopsy: tangential   Informed consent: discussed and consent obtained   Timeout: patient name, date of birth, surgical site, and procedure verified   Procedure prep:  Patient was prepped and draped in usual sterile fashion (Non sterile) Prep type:  Chlorhexidine Anesthesia: the lesion was anesthetized in a standard fashion   Anesthetic:  1% lidocaine w/ epinephrine 1-100,000 local infiltration Instrument used: flexible razor blade   Outcome: patient tolerated procedure well   Post-procedure details: wound care instructions given    Specimen 1 - Surgical pathology Differential Diagnosis: bcc vs scc  Check Margins: No      I, Lavonna Monarch, MD, have reviewed all documentation for this visit.  The documentation on 04/23/21 for the exam, diagnosis, procedures, and orders are all accurate and complete.

## 2021-05-03 ENCOUNTER — Encounter: Payer: Self-pay | Admitting: Dermatology

## 2021-05-10 ENCOUNTER — Other Ambulatory Visit: Payer: Self-pay

## 2021-05-10 ENCOUNTER — Ambulatory Visit (INDEPENDENT_AMBULATORY_CARE_PROVIDER_SITE_OTHER): Payer: Commercial Managed Care - PPO | Admitting: Neurology

## 2021-05-10 DIAGNOSIS — G4733 Obstructive sleep apnea (adult) (pediatric): Secondary | ICD-10-CM | POA: Diagnosis not present

## 2021-05-10 DIAGNOSIS — Z9989 Dependence on other enabling machines and devices: Secondary | ICD-10-CM

## 2021-05-12 ENCOUNTER — Ambulatory Visit (HOSPITAL_BASED_OUTPATIENT_CLINIC_OR_DEPARTMENT_OTHER): Payer: Commercial Managed Care - PPO | Admitting: Cardiology

## 2021-05-20 ENCOUNTER — Encounter: Payer: Self-pay | Admitting: Neurology

## 2021-05-20 NOTE — Progress Notes (Signed)
PATIENT'S NAME:  Julie Ross, Julie Ross DOB:      April 02, 1961      MR#:    [id number]     DATE OF RECORDING: 05/10/2021 REFERRING M.D.:  Annye Asa, MD Study Performed:     Baseline Polysomnogram  HISTORY:  Julie Ross is a 60 y.o. woman with multiple sclerosis and breast cancer. She notes fatigue.    She has trouble falling asleep and staying asleep.    She often falls asleep more than an hour after falling asleep.  If she falls asleep .  She often goes back to sleep after she gets up in the morning and feeds the dogs, sleeping another couple hours.  She has trouble getting comfortable.  She has aching in her calves  The patient's weight 205 pounds with a height of 66 (inches), resulting in a BMI of 33. kg/m2.  CURRENT MEDICATIONS: Vitamin C, Lipitor, Lioresal, Wellbutrin XL, Zyrtec, Vitamin D3, Klonopin, Vitamin B-12, Epinephrine, Flonase, Neurontin, Vicodin, Klor-Con, Synthroid, Hyzaar, Prilosec, Zofran, Vitamin E   PROCEDURE:  This is a multichannel digital polysomnogram utilizing the Somnostar 11.2 system.  Electrodes and sensors were applied and monitored per AASM Specifications.   EEG, EOG, Chin and Limb EMG, were sampled at 200 Hz.  ECG, Snore and Nasal Pressure, Thermal Airflow, Respiratory Effort, CPAP Flow and Pressure, Oximetry was sampled at 50 Hz. Digital video and audio were recorded.      BASELINE STUDY  Lights Out was at 22:21 and Lights On at 05:16.  Total recording time (TRT) was 415.5 minutes, with a total sleep time (TST) of 357.5 minutes.   The patient's sleep latency was 43.5 minutes.  REM latency was 87.5 minutes.  The sleep efficiency was 86. %.     SLEEP ARCHITECTURE: WASO (Wake after sleep onset) was 14.5 minutes.  There were 21.5 minutes in Stage N1, 282 minutes Stage N2, 29.5 minutes Stage N3 and 24.5 minutes in Stage REM.  The percentage of Stage N1 was 6.%, Stage N2 was 78.9%, Stage N3 was 8.3% and Stage R (REM sleep) was 6.9%.  The arousals were noted as: 38 were  spontaneous, 0 were associated with PLMs, 2 were associated with respiratory events   RESPIRATORY ANALYSIS:  There were a total of 15 respiratory events:  0 obstructive apneas, 0 central apneas and 0 mixed apneas with a total of 0 apneas and an apnea index (AI) of 0 /hour. There were 15 hypopneas with a hypopnea index of 2.5 /hour. The patient also had 0 respiratory event related arousals (RERAs).      The total APNEA/HYPOPNEA INDEX (AHI) was 2.5/hour and the total RESPIRATORY DISTURBANCE INDEX was  2.5 /hour.  9 events occurred in REM sleep and 12 events in NREM. The REM AHI was  22. /hour, versus a non-REM AHI of 1.1. The patient spent 134.5 minutes of total sleep time in the supine position and 223 minutes in non-supine.. The supine AHI was 4.9 versus a non-supine AHI of 1.1.  OXYGEN SATURATION & C02:  The Wake baseline 02 saturation was 96%, with the lowest being 85%. Time spent below 89% saturation equaled 5 minutes.  PERIODIC LIMB MOVEMENTS:   The patient had a total of 0 Periodic Limb Movements.  The Periodic Limb Movement (PLM) index was 0 and the PLM Arousal index was 0/hour.  OTHER Audio and video analysis did not show any abnormal or unusual movements, behaviors, phonations or vocalizations.   The patient took no bathroom breaks.  Snoring was  noted.   EKG showed normal sinus rhythm (NSR).      Post-study, the patient indicated that sleep was longer than usual.    IMPRESSION:  Negligible overall Obstructive Sleep Apnea with AHI = 2.5.  However, there was moderate REM related OSA with a REM AHI = 22. No significant PLMS Delayed sleep latency.  Normal sleep efficiency. All stages of sleep were recorded.      RECOMMENDATIONS:  OSA was minimal and CPAP is not indicated.   Consider weight loss. There was OSA during supine REM and side sleeping is recommended.    I certify that I have reviewed the entire raw data recording prior to the issuance of this report in accordance with the  Standards of Accreditation of the [place] [employer] of [employer] (AASM)    Malayjah Otoole A. Felecia Shelling, MD, PhD, FAAN Certified in Neurology, Clinical Neurophysiology, Sleep Medicine, Pain Medicine and Neuroimaging Director, [place] [facility] [address] [place], [place] [address] [phone]

## 2021-06-03 ENCOUNTER — Ambulatory Visit: Payer: Commercial Managed Care - PPO | Admitting: Physician Assistant

## 2021-06-29 ENCOUNTER — Other Ambulatory Visit: Payer: Self-pay

## 2021-06-29 ENCOUNTER — Ambulatory Visit (HOSPITAL_BASED_OUTPATIENT_CLINIC_OR_DEPARTMENT_OTHER): Payer: Commercial Managed Care - PPO | Admitting: Family

## 2021-06-29 ENCOUNTER — Encounter (HOSPITAL_BASED_OUTPATIENT_CLINIC_OR_DEPARTMENT_OTHER): Payer: Self-pay | Admitting: Family

## 2021-06-29 VITALS — BP 120/72 | HR 68 | Ht 66.0 in | Wt 200.0 lb

## 2021-06-29 DIAGNOSIS — E785 Hyperlipidemia, unspecified: Secondary | ICD-10-CM | POA: Diagnosis not present

## 2021-06-29 DIAGNOSIS — I340 Nonrheumatic mitral (valve) insufficiency: Secondary | ICD-10-CM

## 2021-06-29 DIAGNOSIS — I10 Essential (primary) hypertension: Secondary | ICD-10-CM | POA: Diagnosis not present

## 2021-06-29 DIAGNOSIS — I25118 Atherosclerotic heart disease of native coronary artery with other forms of angina pectoris: Secondary | ICD-10-CM

## 2021-06-29 MED ORDER — ASPIRIN EC 81 MG PO TBEC: 81.0000 mg | DELAYED_RELEASE_TABLET | Freq: Every day | ORAL | 3 refills | Status: DC

## 2021-06-29 MED ORDER — ASPIRIN EC 81 MG PO TBEC
81.0000 mg | DELAYED_RELEASE_TABLET | Freq: Every day | ORAL | 3 refills | Status: AC
Start: 2021-06-29 — End: ?

## 2021-06-29 NOTE — Patient Instructions (Addendum)
Medication Instructions:  Your physician has recommended you make the following change in your medication:   START Aspirin EC 81mg  daily  *If you need a refill on your cardiac medications before your next appointment, please call your pharmacy*   Lab Work: None ordered today.   We recommend a LDL (bad cholesterol) goal of less than 70. Your LDL was at goal by labs 06/2020.   Testing/Procedures: Your echocardiogram showed normal heart pumping function.   Your cardiac CTA showed minimal nonobstructive plaque which we prevent from worsening by keeping blood pressure well controlled, taking Aspirin, and keeping cholesterol well controlled.    Follow-Up: At [facility], you and your health needs are our priority.  As part of our continuing mission to provide you with exceptional heart care, we have created designated Provider Care Teams.  These Care Teams include your primary Cardiologist (physician) and Advanced Practice Providers (APPs -  Physician Assistants and Nurse Practitioners) who all work together to provide you with the care you need, when you need it.  We recommend signing up for the patient portal called "MyChart".  Sign up information is provided on this After Visit Summary.  MyChart is used to connect with patients for Virtual Visits (Telemedicine).  Patients are able to view lab/test results, encounter notes, upcoming appointments, etc.  Non-urgent messages can be sent to your provider as well.   To learn more about what you can do with MyChart, go to [email or web address].    Your next appointment:   6 month(s)  The format for your next appointment:   In Person  Provider:   Dr. Debara Pickett or Loel Dubonnet, NP at [facility]   Other Instructions  Heart Healthy Diet Recommendations: A low-salt diet is recommended. Meats should be grilled, baked, or boiled. Avoid fried foods. Focus on lean protein sources like fish or chicken with vegetables and  fruits. The American Heart Association is a Microbiologist!  American Heart Association Diet and Lifeystyle Recommendations    Exercise recommendations: The American Heart Association recommends 150 minutes of moderate intensity exercise weekly. Try 30 minutes of moderate intensity exercise 4-5 times per week. This could include walking, jogging, or swimming.   For coronary artery disease often called "heart disease" we aim for optimal guideline directed medical therapy. We use the "A, B, C"s to help keep [place] on track!  A = Aspirin 81mg  daily B = Blood pressure control. C = Cholesterol control. You take Atorvastatin to help control your cholesterol.

## 2021-06-29 NOTE — Progress Notes (Signed)
Office Visit    Patient Name: Julie Ross Date of Encounter: 06/29/2021  PCP:  Midge Minium, MD   [place]  Cardiologist:  Pixie Casino, MD  Advanced Practice Provider:  No care team member to display Electrophysiologist:  None      Chief Complaint    Julie Ross is a 61 y.o. female with a hx of nonobstructive coronary artery disease, hypertension, MS not on treatment due to side effects, hyperlipidemia, hypothyroidism, left breast cancer s/p radiation and chemotherapy, mild mitral regurgitation, hiatal hernia presents today for follow-up after echocardiogram  Past Medical History    Past Medical History:  Diagnosis Date   Anemia    h/o - ? bld. transfusion - more recent- ?2013   Anxiety    uses xanax mainly for sleep   Arthritis    knees    Basal cell carcinoma 01/11/2005   rim left nostril-,mohs   BCC (basal cell carcinoma) 01/11/2005   right cheek mid-mohs   BCC (basal cell carcinoma) 09/12/2006   right forehead -mohs   BCC (basal cell carcinoma) 01/19/2016   right upper arm -[id number]   BCC (basal cell carcinoma) 02/18/2019   forehead-mohs   Breast cancer ([place]) 11/2011   lul/ER+PR+, x1 lymph node    Cancer (HCC)    squamous cell skin cancer x7   Complication of anesthesia    Depression    GERD (gastroesophageal reflux disease)    H/O bone density study 03/2011   H/O colonoscopy    H/O echocardiogram    last one 02/2013, due to chemotherapy   Heart burn    Hx of radiation therapy 04/17/12- 06/04/12   left chest wall, axilla, L supraclavicular fossa 4500 cGy, left mastectomy scar/chest wall 5940 cGy   Hypertension    Hypothyroidism    Meningioma ([place]) 08/2020   Multiple sclerosis ([place])    Neuromuscular disorder ([place])    MS TX WITH CYMBALTA    Personal history of chemotherapy    Personal history of radiation therapy    SCC (squamous cell carcinoma) 01/11/2005   chest mid   Sleep apnea    no CPAP   Squamous cell  carcinoma of skin 01/11/2005   over left lip   Wears glasses    Past Surgical History:  Procedure Laterality Date   APPLICATION OF ROBOTIC ASSISTANCE FOR SPINAL PROCEDURE N/A 03/12/2019   Procedure: APPLICATION OF ROBOTIC ASSISTANCE FOR SPINAL PROCEDURE;  Surgeon: Judith Part, MD;  Location: [facility];  Service: Neurosurgery;  Laterality: N/A;   BREAST REDUCTION SURGERY Right 01/25/2021   Procedure: MAMMARY REDUCTION  (BREAST);  Surgeon: Irene Limbo, MD;  Location: [facility];  Service: Plastics;  Laterality: Right;   CESAREAN SECTION     X2    COLONOSCOPY     ESOPHAGOGASTRODUODENOSCOPY     FLEXIBLE SIGMOIDOSCOPY     I & D KNEE WITH POLY EXCHANGE  03/02/2012   Procedure: IRRIGATION AND DEBRIDEMENT KNEE WITH POLY EXCHANGE;  Surgeon: Alta Corning, MD;  Location: [facility];  Service: Orthopedics;  Laterality: Left;  I&D of total knee with Possible Poly Exchange    JOINT REPLACEMENT  07/22/2011   left knee- multiple surgeries    KNEE ARTHROSCOPY     LT KNEE 04/2011   KNEE ARTHROSCOPY  06/20/1982   rt   MASTECTOMY     MASTECTOMY W/ SENTINEL NODE BIOPSY  12/19/2011   Procedure: MASTECTOMY WITH SENTINEL LYMPH NODE BIOPSY;  Surgeon: Haywood Lasso, MD;  Location: [facility];  Service: General;  Laterality: Left;  left breast and left axilla    MOHS SURGERY     right face   PORT-A-CATH REMOVAL  03/06/2012   Procedure: REMOVAL PORT-A-CATH;  Surgeon: Odis Hollingshead, MD;  Location: [facility];  Service: General;  Laterality: N/A;   PORT-A-CATH REMOVAL Right 08/22/2012   Procedure: REMOVAL PORT-A-CATH;  Surgeon: Edward Jolly, MD;  Location: [facility];  Service: General;  Laterality: Right;   PORTACATH PLACEMENT  01/16/2012   Procedure: INSERTION PORT-A-CATH;  Surgeon: Haywood Lasso, MD;  Location: [facility];  Service: General;  Laterality: Right;  Porta Cath Placement    PORTACATH PLACEMENT  05/01/2012   Procedure: INSERTION PORT-A-CATH;  Surgeon:  Haywood Lasso, MD;  Location: [facility];  Service: General;  Laterality: Right;  right internal jugular port-a-cath insertion   TEE WITHOUT CARDIOVERSION  03/06/2012   Procedure: TRANSESOPHAGEAL ECHOCARDIOGRAM (TEE);  Surgeon: Josue Hector, MD;  Location: [facility];  Service: Cardiovascular;  Laterality: N/A;  Rm. [id number]   TONSILLECTOMY     TOTAL KNEE ARTHROPLASTY  08/15/2011   Procedure: TOTAL KNEE ARTHROPLASTY; lft Surgeon: Alta Corning, MD;  Location: [facility];  Service: Orthopedics;  Laterality: Left;  RIGHT KNEE CORTIZONE INJECTION   TOTAL KNEE ARTHROPLASTY Left 08/18/2012   Procedure:  Irrigation and debridement of LEFT total knee;  Removal of total knee parts; Implant of spacers;  Surgeon: Kerin Salen, MD;  Location: [facility];  Service: Orthopedics;  Laterality: Left;   TOTAL KNEE ARTHROPLASTY Right 08/12/2013   TOTAL KNEE ARTHROPLASTY Right 08/12/2013   Procedure: RIGHT TOTAL KNEE ARTHROPLASTY;  Surgeon: Kerin Salen, MD;  Location: [facility];  Service: Orthopedics;  Laterality: Right;   TOTAL KNEE REVISION Left 04/19/2013   Procedure: TOTAL KNEE REVISION AND REMOVAL CEMENT SPACER;  Surgeon: Kerin Salen, MD;  [place] [facility];  Service: Orthopedics;  Laterality: Left;   TRANSFORAMINAL LUMBAR INTERBODY FUSION (TLIF) WITH PEDICLE SCREW FIXATION 1 LEVEL Bilateral 03/12/2019   Procedure: Lumbar four-five Open transforaminal lumbar interbody fusion with Lumbar four-five laminectomy, bilateral Lumbar four-five Pedicle screw placement;  Surgeon: Judith Part, MD;  Location: [facility];  Service: Neurosurgery;  Laterality: Bilateral;   TUMOR REMOVAL     FROM PELVIS AGE 89    Allergies  Allergies  Allergen Reactions   Bee Venom Anaphylaxis   Fentanyl Other (See Comments)    Very low BP, decrease LOC-received Narcan-in 2020    History of Present Illness    Julie Ross is a 61 y.o. female with a hx of nonobstructive coronary artery disease, hypertension, MS not on  treatment due to side effects, hyperlipidemia, hypothyroidism, left breast cancer s/p radiation and chemotherapy, mild mitral regurgitation, hiatal hernia last seen 04/06/2021 by Dr. Debara Pickett.  She has been monitored with serial echoes due to treatment of left breast cancer with radiation, chemotherapy, antibody therapy.  Echo 03/2013 with normal systolic and diastolic function and mild mitral regurgitation. She was seen 04/06/2021 by Dr. Debara Pickett in consult for shortness of breath, chest pressure, palpitations. She was recommended for echocardiogram and cardiac CTA.  Echo 04/19/2021 LVEF 60 to 39%, grade 1 diastolic dysfunction, RV normal size and function, trivial MR, mild calcification of aortic valve without stenosis.  Cardiac CTA 04/23/2021 showed coronary calcium score of 4.25 placing her in the 63rd percentile for age/sex showing only mild nonobstructive coronary disease.  She did have small  to moderate hiatal hernia which could be contributory to symptoms.  She presents today for follow-up. We reviewed her echocardiogram and cardiac CT in detail. Reports no recurrent chest pain nor dyspnea. She enjoys watching her nearly 20 year old granddaughter on occasion and has another granddaughter due  March 14th. Notes difficulty with acid reflux despite her Omeprazole which is relieved by an occasional Pepcid.  No edema, orthopnea, PND. Reports no palpitations.    EKGs/Labs/Other Studies Reviewed:   The following studies were reviewed today:  Cardiac CTA 04/23/21 Non cardiac IMPRESSION: 1. No acute cardiopulmonary abnormalities. 2. Small to moderate size hiatal hernia.   Cardiac FINDINGS: Non-cardiac: See separate report from [facility]. No significant findings on limited lung and soft tissue windows.   Calcium Score: Minute calcium noted in LAD and circumflex   Coronary Arteries: Right dominant with no anomalies   LM: Normal   LAD: Normal   D1: Large vessel 1-24% calcified plaque in  distal vessel   D2: Normal   Circumflex: 1-24% calcified plaque in proximal vessel   OM1: Normal   OM2: Normal   RCA: Normal   PDA: Normal   PLA: Normal   IMPRESSION: 1.  Calcium score 4.25 which is 63 rd percentile for age and sex   2.  Normal ascending aortic root diameter 3.6 cm   3.  CAD RADS 1 non obstructive CAD see description above   Echo 04/19/21   1. Left ventricular ejection fraction, by estimation, is 60 to 65%. The  left ventricle has normal function. The left ventricle has no regional  wall motion abnormalities. Left ventricular diastolic parameters are  consistent with Grade I diastolic  dysfunction (impaired relaxation). The average left ventricular global  longitudinal strain is -22.2 %. The global longitudinal strain is normal.   2. Right ventricular systolic function is normal. The right ventricular  size is normal. There is normal pulmonary artery systolic pressure. The  estimated right ventricular systolic pressure is 22.2 mmHg.   3. The mitral valve is normal in structure. Trivial mitral valve  regurgitation. No evidence of mitral stenosis.   4. The aortic valve is tricuspid. There is mild calcification of the  aortic valve. Aortic valve regurgitation is not visualized. No aortic  stenosis is present.   5. The inferior vena cava is normal in size with greater than 50%  respiratory variability, suggesting right atrial pressure of 3 mmHg.   EKG:  No EKG today.  Recent Labs: 03/31/2021: ALT 14; Hemoglobin 12.6; Platelets 229.0; TSH 0.97 04/06/2021: BUN 17; Creatinine, Ser 1.18; Potassium 3.9; Sodium 134  Recent Lipid Panel    Component Value Date/Time   CHOL 153 03/31/2021 1504   TRIG 212.0 (H) 03/31/2021 1504   HDL 67.80 03/31/2021 1504   CHOLHDL 2 03/31/2021 1504   VLDL 42.4 (H) 03/31/2021 1504   LDLCALC 57 07/14/2020 1047   LDLCALC 64 06/26/2017 1603   LDLDIRECT 62.0 03/31/2021 1504    Home Medications   Current Meds  Medication Sig    Ascorbic Acid (VITAMIN C PO) Take by mouth.   atorvastatin (LIPITOR) 20 MG tablet TAKE 1 TABLET DAILY   baclofen (LIORESAL) 10 MG tablet TAKE 1 TABLET THREE TIMES A DAY AS NEEDED FOR MUSCLE SPASMS   buPROPion (WELLBUTRIN XL) 150 MG 24 hr tablet TAKE 1 TABLET DAILY   clonazePAM (KLONOPIN) 1 MG tablet Take 1 tablet (1 mg total) by mouth at bedtime.   EPINEPHrine 0.3 mg/0.3 mL IJ SOAJ injection Inject 0.3 mg  into the muscle as needed for anaphylaxis.   gabapentin (NEURONTIN) 600 MG tablet TAKE 1 TABLET THREE TIMES A DAY   KLOR-CON M20 20 MEQ tablet TAKE 1 TABLET DAILY   levothyroxine (SYNTHROID) 88 MCG tablet TAKE 1 TABLET DAILY BEFORE BREAKFAST   losartan-hydrochlorothiazide (HYZAAR) 100-25 MG tablet TAKE 1 TABLET EVERY MORNING BEFORE BREAKFAST   omeprazole (PRILOSEC) 40 MG capsule TAKE 1 CAPSULE DAILY   ondansetron (ZOFRAN) 4 MG tablet Take 1 tablet (4 mg total) by mouth every 8 (eight) hours as needed for nausea or vomiting.   sertraline (ZOLOFT) 25 MG tablet Take 25 mg by mouth daily.   [DISCONTINUED] aspirin EC 81 MG tablet Take 1 tablet (81 mg total) by mouth daily. Swallow whole.     Review of Systems      All other systems reviewed and are otherwise negative except as noted above.  Physical Exam    VS:  BP 120/72 (BP Location: Right Arm, Patient Position: Sitting, Cuff Size: Normal)    Pulse 68    Ht 5\' 6"  (1.676 m)    Wt 200 lb (90.7 kg)    LMP 12/13/2011    BMI 32.28 kg/m  , BMI Body mass index is 32.28 kg/m.  Wt Readings from Last 3 Encounters:  06/29/21 200 lb (90.7 kg)  04/06/21 202 lb 9.6 oz (91.9 kg)  03/31/21 202 lb 6.4 oz (91.8 kg)     GEN: Well nourished, well developed, in no acute distress. HEENT: normal. Neck: Supple, no JVD, carotid bruits, or masses. Cardiac: RRR, no murmurs, rubs, or gallops. No clubbing, cyanosis, edema.  Radials/PT 2+ and equal bilaterally.  Respiratory:  Respirations regular and unlabored, clear to auscultation bilaterally. GI: Soft,  nontender, nondistended. MS: No deformity or atrophy. Skin: Warm and dry, no rash. Neuro:  Strength and sensation are intact. Psych: Normal affect.  Assessment & Plan    CAD - Mild nonobstructive disease by cardiac CTA. Will defer addition of BB as she had bradycardia prior to cardiac CT with Metoprolol. Continue Atorvastatin. Start Aspirin EC 81mg  QD. She has no anginal symptoms. Heart healthy diet and regular cardiovascular exercise encouraged.    HTN - BP well controlled. Continue current antihypertensive regimen.    HLD - 06/2020 LDL 57. Upcoming labs with PCP. Recommend maintaining LDl goal <70. Continue Atorvastatin 20mg  QD. Denies myalgia.   Hiatal hernia - Continue to follow with PCP. Continue Omeprazole.  Mild MR -noted by echo 03/2021. Continue optimal BP and volume control. Consider periodic echocardiogram for monitoring.    Disposition: Follow up in 6 month(s) with Pixie Casino, MD or APP.  Signed, Loel Dubonnet, NP 06/29/2021, 7:56 PM [facility]

## 2021-07-01 ENCOUNTER — Ambulatory Visit: Payer: Commercial Managed Care - PPO | Admitting: Allergy

## 2021-07-01 ENCOUNTER — Ambulatory Visit: Payer: Commercial Managed Care - PPO | Admitting: Family

## 2021-07-05 ENCOUNTER — Other Ambulatory Visit: Payer: Self-pay | Admitting: Family Medicine

## 2021-07-07 ENCOUNTER — Encounter: Payer: Self-pay | Admitting: Neurology

## 2021-07-08 DIAGNOSIS — Z0289 Encounter for other administrative examinations: Secondary | ICD-10-CM

## 2021-07-13 ENCOUNTER — Telehealth: Payer: Self-pay | Admitting: *Deleted

## 2021-07-13 NOTE — Telephone Encounter (Signed)
I faxed pt unum form on 07/13/21 to [phone]

## 2021-07-13 NOTE — Telephone Encounter (Signed)
Gave completed/signed form back to medical records to process for pt. 

## 2021-07-16 ENCOUNTER — Other Ambulatory Visit: Payer: Self-pay | Admitting: Family Medicine

## 2021-08-10 ENCOUNTER — Other Ambulatory Visit: Payer: Self-pay | Admitting: Family Medicine

## 2021-08-10 DIAGNOSIS — I10 Essential (primary) hypertension: Secondary | ICD-10-CM

## 2021-08-10 DIAGNOSIS — E785 Hyperlipidemia, unspecified: Secondary | ICD-10-CM

## 2021-08-16 ENCOUNTER — Other Ambulatory Visit: Payer: Self-pay | Admitting: Family Medicine

## 2021-08-17 ENCOUNTER — Other Ambulatory Visit: Payer: Self-pay | Admitting: Neurology

## 2021-08-17 NOTE — Telephone Encounter (Signed)
Received refill request for clonazepam.  Last OV was on 11/26/20.  Next OV is scheduled for 11/30/21 .  Last RX was written on 05/19/21 for 90 tabs.   [facility] Database has been reviewed.

## 2021-08-18 ENCOUNTER — Other Ambulatory Visit: Payer: Self-pay | Admitting: Family Medicine

## 2021-08-24 ENCOUNTER — Encounter: Payer: Self-pay | Admitting: Family Medicine

## 2021-08-24 ENCOUNTER — Encounter: Payer: Self-pay | Admitting: Internal Medicine

## 2021-08-24 ENCOUNTER — Telehealth: Payer: Self-pay | Admitting: Hematology and Oncology

## 2021-08-24 NOTE — Progress Notes (Signed)
? ?Patient Care Team: ?Midge Minium, MD as PCP - General (Family Medicine) ?Pixie Casino, MD as PCP - Cardiology (Cardiology) ?Juanda Chance, NP as Nurse Practitioner (Obstetrics and Gynecology) ?Gatha Mayer, MD as Consulting Physician (Gastroenterology) ?Frederik Pear, MD as Consulting Physician (Orthopedic Surgery) ?Lavonna Monarch, MD as Consulting Physician (Dermatology) ?Sater, Nanine Means, MD (Neurology) ? ?DIAGNOSIS:  ?  ICD-10-CM   ?1. Malignant neoplasm of upper-outer quadrant of right breast in female, estrogen receptor positive ([place])  C50.411   ? Z17.0   ?  ? ? ?SUMMARY OF ONCOLOGIC HISTORY: ?Oncology History  ?Breast cancer of upper-outer quadrant of right female breast ([place])  ?11/22/2011 Initial Diagnosis  ? Cancer of upper-outer quadrant of female breast ?  ?12/19/2011 Surgery  ? Left mastectomy with SLN biopsy 2 foci of invasive ductal carcinoma 0.8 and 0.6 cm low-grade ER PR positive HER-2 positive ratio 3.27 Ki-67 11% one lymph node positive out of 4 (T1, N1, M0 stage II) ?  ?02/16/2012 - 04/14/2013 Chemotherapy  ? Taxotere, carboplatin, Herceptin x2 cycles complicated by bacteremia and left knee sepsis requiring revision of the knee. Herceptin maintenance every 3 weeks. Herceptin maintenance completed 04/14/2013 ?  ?04/17/2012 - 06/04/2012 Radiation Therapy  ? Radiation therapy to the breast ?  ?02/14/2013 Genetic Testing  ? Negative genetic testing on the Breast/Ovarian Cancer panel.  The Breast/Ovarian gene panel offered by [facility] includes sequencing and rearrangement analysis for the following 21 genes:  ATM, BARD1, BRCA1, BRCA2, BRIP1, CDH1, CHEK2, EPCAM, FANCC, MLH1, MSH2, MSH6, NBN, PALB2, PMS2, PTEN, STK11, RAD51C, RAD51D, TP53, and XRCC2.   The report date is February 14, 2013. ?  ?03/13/2013 -  Anti-estrogen oral therapy  ? Letrozole 2.5 mg once daily ?  ? ? ?CHIEF COMPLIANT: Follow-up of left breast cancer  ? ?INTERVAL HISTORY: Julie Ross is a 61 y.o. with above-mentioned history of  breast cancer treated with mastectomy, adjuvant chemotherapy, radiation, and antiestrogen therapy with letrozole. She presents to the clinic today for annual follow-up.  ?Patient had lab work done for insurance and was found to have an elevated CEA level and she was requested to follow-up with [place].  She had breast reduction on the right side. ? ?ALLERGIES:  is allergic to bee venom and fentanyl. ? ?MEDICATIONS:  ?Current Outpatient Medications  ?Medication Sig Dispense Refill  ? clonazePAM (KLONOPIN) 1 MG tablet TAKE 1 TABLET(1 MG) BY MOUTH AT BEDTIME 90 tablet 0  ? Ascorbic Acid (VITAMIN C PO) Take by mouth.    ? aspirin EC 81 MG tablet Take 1 tablet (81 mg total) by mouth daily. Swallow whole. 90 tablet 3  ? atorvastatin (LIPITOR) 20 MG tablet TAKE 1 TABLET DAILY 90 tablet 3  ? baclofen (LIORESAL) 10 MG tablet TAKE 1 TABLET THREE TIMES A DAY AS NEEDED FOR MUSCLE SPASMS 270 tablet 3  ? buPROPion (WELLBUTRIN XL) 150 MG 24 hr tablet TAKE 1 TABLET DAILY 90 tablet 3  ? EPINEPHrine 0.3 mg/0.3 mL IJ SOAJ injection Inject 0.3 mg into the muscle as needed for anaphylaxis. 1 each 3  ? gabapentin (NEURONTIN) 600 MG tablet TAKE 1 TABLET THREE TIMES A DAY 270 tablet 3  ? KLOR-CON M20 20 MEQ tablet TAKE 1 TABLET DAILY 90 tablet 3  ? levothyroxine (SYNTHROID) 88 MCG tablet TAKE 1 TABLET DAILY BEFORE BREAKFAST 90 tablet 3  ? losartan-hydrochlorothiazide (HYZAAR) 100-25 MG tablet TAKE 1 TABLET EVERY MORNING BEFORE BREAKFAST 90 tablet 3  ? omeprazole (PRILOSEC) 40 MG capsule TAKE 1 CAPSULE  DAILY 90 capsule 3  ? ondansetron (ZOFRAN) 4 MG tablet Take 1 tablet (4 mg total) by mouth every 8 (eight) hours as needed for nausea or vomiting. 20 tablet 0  ? sertraline (ZOLOFT) 25 MG tablet Take 25 mg by mouth daily.    ? ?No current facility-administered medications for this visit.  ? ? ?PHYSICAL EXAMINATION: ?ECOG PERFORMANCE STATUS: 1 - Symptomatic but completely ambulatory ? ?Vitals:  ? 08/25/21 0911  ?BP: (!) 133/56  ?Pulse: 66  ?Resp:  16  ?Temp: (!) 97.5 ?F (36.4 ?C)  ?SpO2: 98%  ? ?Filed Weights  ? 08/25/21 0911  ?Weight: 200 lb 8 oz (90.9 kg)  ? ? ?BREAST: No palpable lumps or masses in the right breast.  Breast reduction scars are evident.  Left chest wall no palpable lumps or nodules (exam performed in the presence of a chaperone) ? ?LABORATORY DATA:  ?I have reviewed the data as listed ?CMP Latest Ref Rng & Units 04/06/2021 03/31/2021 01/20/2021  ?Glucose 70 - 99 mg/dL 99 122(H) 115(H)  ?BUN 8 - 27 mg/dL _0 ?Creatinine 0.57 - 1.00 mg/dL 1.18(H) 1.07 1.04(H)  ?Sodium 134 - 144 mmol/L 134 137 132(L)  ?Potassium 3.5 - 5.2 mmol/L 3.9 3.7 3.8  ?Chloride 96 - 106 mmol/L 95(L) 101 99  ?CO2 20 - 29 mmol/L _1 ?Calcium 8.7 - 10.3 mg/dL 9.4 9.5 9.0  ?Total Protein 6.0 - 8.3 g/dL - 7.0 -  ?Total Bilirubin 0.2 - 1.2 mg/dL - 0.9 -  ?Alkaline Phos 39 - 117 U/L - 144(H) -  ?AST 0 - 37 U/L - 19 -  ?ALT 0 - 35 U/L - 14 -  ? ? ?Lab Results  ?Component Value Date  ? WBC 6.5 03/31/2021  ? HGB 12.6 03/31/2021  ? HCT 38.0 03/31/2021  ? MCV 78.1 03/31/2021  ? PLT 229.0 03/31/2021  ? NEUTROABS 4.4 03/31/2021  ? ? ?ASSESSMENT & PLAN:  ?Breast cancer of upper-outer quadrant of right female breast ([place]) ?Left breast invasive ductal carcinoma with DCIS ER/PR positive HER-2 positive: Status post mastectomy, adjuvant chemotherapy in 1 year of Herceptin. Currently on letrozole adjuvant hormonal therapy started 03/13/13 completed 07/09/2020 ?  ?Breast cancer surveillance: ?1.  Breast exam done 07/09/2020: Benign ?2.  [facility]  ?  ?Multiple sclerosis: Causing fatigue and body pains.  Patient was offered a treatment for MS but she does not want to take it because one of the  Adverse effects was neutropenia.    ?  ?Anxiety/depression: On Wellbutrin    ?  ?Meningiomas (2 of them): Status post stereotactic radiosurgery.  Follows with Dr. Mickeal Skinner ? ?Elevated CEA: 6.1 ?I discussed with the patient that CEA is not a screening test for colon cancer  and hence should not have been done.  I suspect that it is related to her history of irritable bowel syndrome.  However in order to be complete I recommended that she undergo a colonoscopy.  She will contact [facility] and make an appointment. ?There is no indication to do anything further from our standpoint with regards to this elevation of CEA. ?It is my personal opinion that it should never have been ordered without a known diagnosis of colon cancer. ? ?Return to clinic on an as-needed basis ? ? ? ?No orders of the defined types were placed in this encounter. ? ?The patient has a good understanding of the overall plan. she agrees with it. she will call  with any problems that may develop before the next visit here. ? ?Total time spent: 20 mins including face to face time and time spent for planning, charting and coordination of care ? ?Rulon Eisenmenger, MD, MPH ?08/25/2021 ? ?I, Thana Ates, am acting as scribe for Dr. Nicholas Lose. ? ?I have reviewed the above documentation for accuracy and completeness, and I agree with the above. ? ? ? ? ? ? ?

## 2021-08-24 NOTE — Telephone Encounter (Signed)
Called patient to schedule appointment per 3/7 inbasket, patient is aware of date and time.  ?

## 2021-08-24 NOTE — Telephone Encounter (Signed)
Pt is requesting guidance on this lab?  ?

## 2021-08-25 ENCOUNTER — Other Ambulatory Visit: Payer: Self-pay

## 2021-08-25 ENCOUNTER — Inpatient Hospital Stay: Payer: Commercial Managed Care - PPO | Attending: Hematology and Oncology | Admitting: Hematology and Oncology

## 2021-08-25 DIAGNOSIS — Z170 Estrogen receptor positive status [ER+]: Secondary | ICD-10-CM | POA: Diagnosis not present

## 2021-08-25 DIAGNOSIS — F32A Depression, unspecified: Secondary | ICD-10-CM | POA: Diagnosis not present

## 2021-08-25 DIAGNOSIS — Z9221 Personal history of antineoplastic chemotherapy: Secondary | ICD-10-CM | POA: Diagnosis not present

## 2021-08-25 DIAGNOSIS — K589 Irritable bowel syndrome without diarrhea: Secondary | ICD-10-CM | POA: Insufficient documentation

## 2021-08-25 DIAGNOSIS — R970 Elevated carcinoembryonic antigen [CEA]: Secondary | ICD-10-CM | POA: Diagnosis not present

## 2021-08-25 DIAGNOSIS — Z79899 Other long term (current) drug therapy: Secondary | ICD-10-CM | POA: Insufficient documentation

## 2021-08-25 DIAGNOSIS — Z923 Personal history of irradiation: Secondary | ICD-10-CM | POA: Insufficient documentation

## 2021-08-25 DIAGNOSIS — F419 Anxiety disorder, unspecified: Secondary | ICD-10-CM | POA: Insufficient documentation

## 2021-08-25 DIAGNOSIS — C50411 Malignant neoplasm of upper-outer quadrant of right female breast: Secondary | ICD-10-CM | POA: Diagnosis present

## 2021-08-25 DIAGNOSIS — Z9012 Acquired absence of left breast and nipple: Secondary | ICD-10-CM | POA: Diagnosis not present

## 2021-08-25 DIAGNOSIS — G35 Multiple sclerosis: Secondary | ICD-10-CM | POA: Insufficient documentation

## 2021-08-25 DIAGNOSIS — Z79811 Long term (current) use of aromatase inhibitors: Secondary | ICD-10-CM | POA: Insufficient documentation

## 2021-08-25 NOTE — Assessment & Plan Note (Signed)
Left breast invasive ductal carcinoma with DCIS ER/PR positive HER-2 positive: Status post mastectomy, adjuvant chemotherapy in 1 year of Herceptin. Currently on letrozole adjuvant hormonal therapy started 03/13/13 completed 07/09/2020 ?? ?Breast cancer surveillance: ?1.? Breast exam done 07/09/2020: Benign ?2.? [facility]  ?? ?Multiple sclerosis:?Causing fatigue and body pains.? Patient was offered a treatment for MS but she does not want to take it because one of the? Adverse effects was neutropenia. ?  ?? ?Anxiety/depression: On Wellbutrin ?  ?? ?Meningiomas (2 of them): Patient will need stereotactic radiosurgery.  Follows with Dr. Mickeal Skinner ?Return to clinic in 1 year for follow-up ?

## 2021-09-07 ENCOUNTER — Encounter: Payer: Self-pay | Admitting: Family Medicine

## 2021-09-07 DIAGNOSIS — R970 Elevated carcinoembryonic antigen [CEA]: Secondary | ICD-10-CM

## 2021-09-10 ENCOUNTER — Encounter: Payer: Self-pay | Admitting: Internal Medicine

## 2021-09-28 ENCOUNTER — Encounter: Payer: Self-pay | Admitting: Gastroenterology

## 2021-09-28 ENCOUNTER — Ambulatory Visit: Payer: Commercial Managed Care - PPO | Admitting: Gastroenterology

## 2021-09-28 VITALS — HR 67 | Ht 66.0 in | Wt 200.2 lb

## 2021-09-28 DIAGNOSIS — K219 Gastro-esophageal reflux disease without esophagitis: Secondary | ICD-10-CM

## 2021-09-28 DIAGNOSIS — R970 Elevated carcinoembryonic antigen [CEA]: Secondary | ICD-10-CM | POA: Insufficient documentation

## 2021-09-28 NOTE — Progress Notes (Signed)
? ? ? ?09/28/2021 ?[place] ?[id number] ?05-23-1961 ? ? ?HISTORY OF PRESENT ILLNESS: This is a 61 year old female who is a patient of Dr. Celesta Aver.  She is here today to discuss colonoscopy for evaluation of an elevated CEA level.  She had labs done in early March through her insurance company and a CEA level was drawn.  It was slightly elevated 6.1.  Her last colonoscopy in 2016 at [facility] was normal.  She says that she will occasionally get diarrhea within 30 minutes after eating, but otherwise no change in bowel habits.  She does not see any blood in her stool.  No abdominal pains.  She does admit to heartburn or reflux, for which she has been on Prilosec 40 mg daily for quite some time.  She had some episodes where it woke her from sleep at night.  She denies any dysphagia.  Hemoglobin is normal.  She does note that she had an EGD several years ago. ? ?Referred here by Dr. Birdie Riddle. ? ?Past Medical History:  ?Diagnosis Date  ? Anemia   ? h/o - ? bld. transfusion - more recent- ?2013  ? Anxiety   ? uses xanax mainly for sleep  ? Arthritis   ? knees   ? Basal cell carcinoma 01/11/2005  ? rim left nostril-,mohs  ? BCC (basal cell carcinoma) 01/11/2005  ? right cheek mid-mohs  ? BCC (basal cell carcinoma) 09/12/2006  ? right forehead -mohs  ? BCC (basal cell carcinoma) 01/19/2016  ? right upper arm -[id number]  ? BCC (basal cell carcinoma) 02/18/2019  ? forehead-mohs  ? Breast cancer [place] 11/[place]  ? lul/ER+PR+, x1 lymph node   ? Cancer ([facility]   ? squamous cell skin cancer x7  ? Complication of anesthesia   ? Depression   ? GERD (gastroesophageal reflux disease)   ? H/O bone density study 03/2011  ? H/O colonoscopy   ? H/O echocardiogram   ? last one 02/2013, due to chemotherapy  ? Heart burn   ? Hx of radiation therapy 04/17/12- 06/04/12  ? left chest wall, axilla, L supraclavicular fossa 4500 cGy, left mastectomy scar/chest wall 5940 cGy  ? Hypertension   ? Hypothyroidism   ? IBS (irritable bowel syndrome)   ?  [place]  ? Multiple sclerosis ([facility]   ? Neuromuscular disorder ([place]   ? [place]  ? Personal history of chemotherapy   ? Personal history of radiation therapy   ? SCC (squamous cell carcinoma) 01/11/2005  ? chest mid  ? Sleep apnea   ? no CPAP  ? Squamous cell carcinoma of skin 01/11/2005  ? over left lip  ? Wears glasses   ? ?Past Surgical History:  ?Procedure Laterality Date  ? APPLICATION OF ROBOTIC ASSISTANCE FOR SPINAL PROCEDURE N/A 03/12/2019  ? Procedure: APPLICATION OF ROBOTIC ASSISTANCE FOR SPINAL PROCEDURE;  Surgeon: OJudith Part MD;  [place]  Service: Neurosurgery;  Laterality: N/A;  ? BREAST REDUCTION SURGERY Right 01/25/2021  ? Procedure: MAMMARY REDUCTION  (BREAST);  Surgeon: TIrene Limbo MD;  [place]  Service: Plastics;  Laterality: Right;  ? CESAREAN SECTION    ? X2   ? COLONOSCOPY    ? ESOPHAGOGASTRODUODENOSCOPY    ? FLEXIBLE SIGMOIDOSCOPY    ? I & D KNEE WITH POLY EXCHANGE  03/02/2012  ? Procedure: IRRIGATION AND DEBRIDEMENT KNEE WITH POLY EXCHANGE;  Surgeon: JAlta Corning MD;  [place]  Service: Orthopedics;  Laterality: Left;  I&D of total knee with Possible Poly Exchange   ? JOINT REPLACEMENT  07/22/2011  ? left knee- multiple surgeries   ? KNEE ARTHROSCOPY    ? LT KNEE 04/2011  ? KNEE ARTHROSCOPY  06/20/1982  ? rt  ? MASTECTOMY    ? MASTECTOMY W/ SENTINEL NODE BIOPSY  12/19/2011  ? Procedure: MASTECTOMY WITH SENTINEL LYMPH NODE BIOPSY;  Surgeon: Haywood Lasso, MD;  Location: [place];  Service: General;  Laterality: Left;  left breast and left axilla ?  ? MOHS SURGERY    ? right face  ? PORT-A-CATH REMOVAL  03/06/2012  ? Procedure: REMOVAL PORT-A-CATH;  Surgeon: Odis Hollingshead, MD;  Location: [place];  Service: General;  Laterality: N/A;  ? PORT-A-CATH REMOVAL Right 08/22/2012  ? Procedure: REMOVAL PORT-A-CATH;  Surgeon: Edward Jolly, MD;  Location: [place];  Service: General;  Laterality: Right;  ?  PORTACATH PLACEMENT  01/16/2012  ? Procedure: INSERTION PORT-A-CATH;  Surgeon: Haywood Lasso, MD;  Location: [place];  Service: General;  Laterality: Right;  [place] Cath Placement   ? PORTACATH PLACEMENT  05/01/2012  ? Procedure: INSERTION PORT-A-CATH;  Surgeon: Haywood Lasso, MD;  Location: [place];  Service: General;  Laterality: Right;  right internal jugular port-a-cath insertion  ? TEE WITHOUT CARDIOVERSION  03/06/2012  ? Procedure: TRANSESOPHAGEAL ECHOCARDIOGRAM (TEE);  Surgeon: Josue Hector, MD;  Location: [place];  Service: Cardiovascular;  Laterality: N/A;  Rm. [id number]  ? TONSILLECTOMY    ? TOTAL KNEE ARTHROPLASTY  08/15/2011  ? Procedure: TOTAL KNEE ARTHROPLASTY; lft Surgeon: Alta Corning, MD;  Location: [place];  Service: Orthopedics;  Laterality: Left;  RIGHT KNEE CORTIZONE INJECTION  ? TOTAL KNEE ARTHROPLASTY Left 08/18/2012  ? Procedure:  Irrigation and debridement of LEFT total knee;  Removal of total knee parts; Implant of spacers;  Surgeon: Kerin Salen, MD;  Location: [place];  Service: Orthopedics;  Laterality: Left;  ? TOTAL KNEE ARTHROPLASTY Right 08/12/2013  ? TOTAL KNEE ARTHROPLASTY Right 08/12/2013  ? Procedure: RIGHT TOTAL KNEE ARTHROPLASTY;  Surgeon: Kerin Salen, MD;  Location: [place];  Service: Orthopedics;  Laterality: Right;  ? TOTAL KNEE REVISION Left 04/19/2013  ? Procedure: TOTAL KNEE REVISION AND REMOVAL CEMENT SPACER;  Surgeon: Kerin Salen, MD;  Location: [place];  Service: Orthopedics;  Laterality: Left;  ? TRANSFORAMINAL LUMBAR INTERBODY FUSION (TLIF) WITH PEDICLE SCREW FIXATION 1 LEVEL Bilateral 03/12/2019  ? Procedure: Lumbar four-five Open transforaminal lumbar interbody fusion with Lumbar four-five laminectomy, bilateral Lumbar four-five Pedicle screw placement;  Surgeon: Judith Part, MD;  Location: [place];  Service: Neurosurgery;  Laterality: Bilateral;  ? TUMOR REMOVAL    ? FROM [place] AGE 51  ? ? reports that she has  never smoked. She has never used smokeless tobacco. She reports that she does not drink alcohol and does not use drugs. ?family history includes Arthritis in her mother; Breast cancer in her paternal aunt and paternal grandmother; Dementia in her father; Diabetes type II in her father; Heart disease in her maternal grandfather and mother; Hypertension in her mother; Lung cancer in her maternal uncle; Ovarian cancer in her paternal aunt. ?Allergies  ?Allergen Reactions  ? Bee Venom Anaphylaxis  ? Fentanyl Other (See Comments)  ?  Very low BP, decrease LOC-received Narcan-in 2020  ? ? ?  ?Outpatient Encounter Medications as of 09/28/2021  ?Medication Sig  ? Ascorbic Acid (VITAMIN C PO) Take by mouth.  ?  aspirin EC 81 MG tablet Take 1 tablet (81 mg total) by mouth daily. Swallow whole.  ? atorvastatin (LIPITOR) 20 MG tablet TAKE 1 TABLET DAILY  ? baclofen (LIORESAL) 10 MG tablet TAKE 1 TABLET THREE TIMES A DAY AS NEEDED FOR MUSCLE SPASMS  ? buPROPion (WELLBUTRIN XL) 150 MG 24 hr tablet TAKE 1 TABLET DAILY  ? clonazePAM (KLONOPIN) 1 MG tablet TAKE 1 TABLET(1 MG) BY MOUTH AT BEDTIME  ? EPINEPHrine 0.3 mg/0.3 mL IJ SOAJ injection Inject 0.3 mg into the muscle as needed for anaphylaxis.  ? gabapentin (NEURONTIN) 600 MG tablet TAKE 1 TABLET THREE TIMES A DAY  ? HYDROcodone-acetaminophen (NORCO/VICODIN) 5-325 MG tablet SMARTSIG:1 Tablet(s) By Mouth Every 12 Hours PRN  ? KLOR-CON M20 20 MEQ tablet TAKE 1 TABLET DAILY  ? levothyroxine (SYNTHROID) 88 MCG tablet TAKE 1 TABLET DAILY BEFORE BREAKFAST  ? losartan-hydrochlorothiazide (HYZAAR) 100-25 MG tablet TAKE 1 TABLET EVERY MORNING BEFORE BREAKFAST  ? omeprazole (PRILOSEC) 40 MG capsule TAKE 1 CAPSULE DAILY  ? ondansetron (ZOFRAN) 4 MG tablet Take 1 tablet (4 mg total) by mouth every 8 (eight) hours as needed for nausea or vomiting.  ? sertraline (ZOLOFT) 25 MG tablet Take 25 mg by mouth daily.  ? ?No facility-administered encounter medications on file as of 09/28/2021.   ? ? ? ?REVIEW OF SYSTEMS  : All other systems reviewed and negative except where noted in the History of Present Illness. ? ? ?PHYSICAL EXAM: ?Pulse 67   Ht '5\' 6"'$  (1.676 m)   Wt 200 lb 3.2 oz (90.8 kg)   LMP 12/13/18

## 2021-09-28 NOTE — Patient Instructions (Signed)
Switch Omeprazole to evening or add Pepcid 20 mg at bedtime.  ? ?You have been scheduled for an endoscopy and colonoscopy. Please follow the written instructions given to you at your visit today. ?Please pick up your prep supplies at the pharmacy within the next 1-3 days. ?If you use inhalers (even only as needed), please bring them with you on the day of your procedure. ? ?If you are age 63 or younger, your body mass index should be between 19-25. Your Body mass index is 32.31 kg/m?Marland Kitchen If this is out of the aformentioned range listed, please consider follow up with your Primary Care Provider.  ? ?________________________________________________________ ? ?The [facility] providers would like to encourage you to use [facility] to communicate with providers for non-urgent requests or questions.  Due to long hold times on the telephone, sending your provider a message by [facility] may be a faster and more efficient way to get a response.  Please allow 48 business hours for a response.  Please remember that this is for non-urgent requests.  ?_______________________________________________________ ? ?

## 2021-09-29 ENCOUNTER — Encounter: Payer: Commercial Managed Care - PPO | Admitting: Family Medicine

## 2021-10-06 ENCOUNTER — Encounter: Payer: Self-pay | Admitting: Family Medicine

## 2021-10-06 ENCOUNTER — Other Ambulatory Visit: Payer: Self-pay

## 2021-10-06 ENCOUNTER — Encounter: Payer: Commercial Managed Care - PPO | Admitting: Family Medicine

## 2021-10-06 ENCOUNTER — Ambulatory Visit (INDEPENDENT_AMBULATORY_CARE_PROVIDER_SITE_OTHER): Payer: Commercial Managed Care - PPO | Admitting: Family Medicine

## 2021-10-06 VITALS — BP 124/68 | HR 59 | Temp 97.8°F | Resp 17 | Ht 66.0 in | Wt 203.2 lb

## 2021-10-06 DIAGNOSIS — E669 Obesity, unspecified: Secondary | ICD-10-CM

## 2021-10-06 DIAGNOSIS — Z0000 Encounter for general adult medical examination without abnormal findings: Secondary | ICD-10-CM | POA: Diagnosis not present

## 2021-10-06 DIAGNOSIS — F339 Major depressive disorder, recurrent, unspecified: Secondary | ICD-10-CM

## 2021-10-06 MED ORDER — SERTRALINE HCL 50 MG PO TABS: 50.0000 mg | ORAL_TABLET | Freq: Every day | ORAL | 3 refills | Status: DC

## 2021-10-06 NOTE — Assessment & Plan Note (Signed)
Ongoing issue for pt.  Current BMI is 32.8  Encouraged healthy diet and regular exercise.  Pt states she is going back to [employer].  Check labs to risk stratify.  Will follow. ?

## 2021-10-06 NOTE — Assessment & Plan Note (Signed)
Pt's PE WNL w/ exception of obesity and known SEM.  UTD on pap, mammo, colonoscopy, Tdap, flu.  Will get 2nd Shingrix today.  Check labs.  Anticipatory guidance provided.  ?

## 2021-10-06 NOTE — Patient Instructions (Addendum)
Follow up in 6 months to recheck BP and cholesterol ?We'll notify you of your lab results and make any changes if needed ?Continue to work on healthy diet and regular exercise- you can do it! ?Increase the Sertraline to '50mg'$  daily- 2 of what you have at home and 1 of the new prescription ?Call with any questions or concerns ?Stay Safe!  Stay Healthy! ?Happy Spring!!! ? ? ?

## 2021-10-06 NOTE — Assessment & Plan Note (Signed)
Deteriorated.  Pt's PHQ=11.  Is currently on Wellbutrin '150mg'$  daily and Sertraline '25mg'$  daily.  Will increase Sertraline to '50mg'$  daily and monitor for improvement.  Pt expressed understanding and is in agreement w/ plan.  ?

## 2021-10-06 NOTE — Progress Notes (Signed)
? ?  Subjective:  ? ? Patient ID: Julie Ross, female    DOB: 05-Jun-1961, 61 y.o.   MRN: [id number] ? ?HPI ?CPE- UTD on mammo, pap, colonoscopy, Tdap, flu. ? ?Patient Care Team  ?  Relationship Specialty Notifications Start End  ?Midge Minium, MD PCP - General Family Medicine  07/31/19   ?Pixie Casino, MD PCP - Cardiology Cardiology  06/29/21   ?Juanda Chance, NP Nurse Practitioner Obstetrics and Gynecology  12/17/14   ?Gatha Mayer, MD Consulting Physician Gastroenterology  12/17/14   ?Frederik Pear, MD Consulting Physician Orthopedic Surgery  06/27/17   ?Lavonna Monarch, MD Consulting Physician Dermatology  04/07/20   ?Britt Bottom, MD  Neurology  07/14/20   ?  ? ?Health Maintenance  ?Topic Date Due  ? COVID-19 Vaccine (4 - Booster) 06/09/2020  ? Zoster Vaccines- Shingrix (2 of 2) 05/26/2021  ? HIV Screening  03/31/2022 (Originally 08/29/1975)  ? MAMMOGRAM  11/03/2021  ? INFLUENZA VACCINE  01/18/2022  ? PAP SMEAR-Modifier  11/26/2023  ? COLONOSCOPY (Pts 45-63yr Insurance coverage will need to be confirmed)  02/24/2025  ? TETANUS/TDAP  08/20/2028  ? Hepatitis C Screening  Completed  ? HPV VACCINES  Aged Out  ?  ? ? ?Review of Systems ?Patient reports no vision/ hearing changes, adenopathy,fever, weight change,  persistant/recurrent hoarseness , swallowing issues, chest pain, palpitations, edema, persistant/recurrent cough, hemoptysis, dyspnea (rest/exertional/paroxysmal nocturnal), gastrointestinal bleeding (melena, rectal bleeding), abdominal pain, significant heartburn, bowel changes, GU symptoms (dysuria, hematuria, incontinence), Gyn symptoms (abnormal  bleeding, pain),  syncope, focal weakness, memory loss, numbness & tingling, skin/hair/nail changes, abnormal bruising or bleeding. ? ?Anxiety/depression- pt's PHQ= 11 today.  Has difficulty w/ sleep ?   ?Objective:  ? Physical Exam ?General Appearance:    Alert, cooperative, no distress, appears stated age  ?Head:    Normocephalic, without obvious  abnormality, atraumatic  ?Eyes:    PERRL, conjunctiva/corneas clear, EOM's intact, both eyes  ?Ears:    Normal TM's and external ear canals, both ears  ?Nose:   Nares normal, septum midline, mucosa normal, no drainage  ?  or sinus tenderness  ?Throat:   Lips, mucosa, and tongue normal; teeth and gums normal  ?Neck:   Supple, symmetrical, trachea midline, no adenopathy;  ?  Thyroid: no enlargement/tenderness/nodules  ?Back:     Symmetric, no curvature, ROM normal, no CVA tenderness  ?Lungs:     Clear to auscultation bilaterally, respirations unlabored  ?Chest Wall:    No tenderness or deformity  ? Heart:    Regular rate and rhythm, S1 and S2 normal, II/VI SEM  ?Breast Exam:    Deferred to GYN  ?Abdomen:     Soft, non-tender, bowel sounds active all four quadrants,  ?  no masses, no organomegaly  ?Genitalia:    Deferred to GYN  ?Rectal:    ?Extremities:   Extremities normal, atraumatic, no cyanosis or edema  ?Pulses:   2+ and symmetric all extremities  ?Skin:   Skin color, texture, turgor normal, no rashes or lesions  ?Lymph nodes:   Cervical, supraclavicular, and axillary nodes normal  ?Neurologic:   CNII-XII intact, normal strength, sensation and reflexes  ?  throughout  ?  ? ? ? ?   ?Assessment & Plan:  ? ? ?

## 2021-10-07 LAB — HEPATIC FUNCTION PANEL
ALT: 13 U/L (ref 0–35)
AST: 17 U/L (ref 0–37)
Albumin: 4.3 g/dL (ref 3.5–5.2)
Alkaline Phosphatase: 134 U/L — ABNORMAL HIGH (ref 39–117)
Bilirubin, Direct: 0.2 mg/dL (ref 0.0–0.3)
Total Bilirubin: 0.8 mg/dL (ref 0.2–1.2)
Total Protein: 6.8 g/dL (ref 6.0–8.3)

## 2021-10-07 LAB — CBC WITH DIFFERENTIAL/PLATELET
Basophils Absolute: 0.1 10*3/uL (ref 0.0–0.1)
Basophils Relative: 1.4 % (ref 0.0–3.0)
Eosinophils Absolute: 0.3 10*3/uL (ref 0.0–0.7)
Eosinophils Relative: 4.1 % (ref 0.0–5.0)
HCT: 35.3 % — ABNORMAL LOW (ref 36.0–46.0)
Hemoglobin: 11.7 g/dL — ABNORMAL LOW (ref 12.0–15.0)
Lymphocytes Relative: 24.6 % (ref 12.0–46.0)
Lymphs Abs: 1.6 10*3/uL (ref 0.7–4.0)
MCHC: 33 g/dL (ref 30.0–36.0)
MCV: 78 fl (ref 78.0–100.0)
Monocytes Absolute: 0.5 10*3/uL (ref 0.1–1.0)
Monocytes Relative: 8.1 % (ref 3.0–12.0)
Neutro Abs: 4 10*3/uL (ref 1.4–7.7)
Neutrophils Relative %: 61.8 % (ref 43.0–77.0)
Platelets: 214 10*3/uL (ref 150.0–400.0)
RBC: 4.52 Mil/uL (ref 3.87–5.11)
RDW: 14.3 % (ref 11.5–15.5)
WBC: 6.5 10*3/uL (ref 4.0–10.5)

## 2021-10-07 LAB — LIPID PANEL
Cholesterol: 132 mg/dL (ref 0–200)
HDL: 66.6 mg/dL (ref >39.00)
LDL Cholesterol: 40 mg/dL (ref 0–99)
NonHDL: 65.63
Total CHOL/HDL Ratio: 2
Triglycerides: 126 mg/dL (ref 0.0–149.0)
VLDL: 25.2 mg/dL (ref 0.0–40.0)

## 2021-10-07 LAB — BASIC METABOLIC PANEL
BUN: 17 mg/dL (ref 6–23)
CO2: 26 mEq/L (ref 19–32)
Calcium: 9.1 mg/dL (ref 8.4–10.5)
Chloride: 96 mEq/L (ref 96–112)
Creatinine, Ser: 1.08 mg/dL (ref 0.40–1.20)
GFR: 54.82 mL/min — ABNORMAL LOW (ref >60.00)
Glucose, Bld: 102 mg/dL — ABNORMAL HIGH (ref 70–99)
Potassium: 3.6 mEq/L (ref 3.5–5.1)
Sodium: 131 mEq/L — ABNORMAL LOW (ref 135–145)

## 2021-10-07 LAB — TSH: TSH: 1.68 u[IU]/mL (ref 0.35–5.50)

## 2021-10-07 LAB — VITAMIN D 25 HYDROXY (VIT D DEFICIENCY, FRACTURES): VITD: 37.12 ng/mL (ref 30.00–100.00)

## 2021-10-08 ENCOUNTER — Telehealth: Payer: Self-pay

## 2021-10-08 DIAGNOSIS — E871 Hypo-osmolality and hyponatremia: Secondary | ICD-10-CM

## 2021-10-08 NOTE — Addendum Note (Signed)
Addended by: Midge Minium on: 10/08/2021 03:43 PM ? ? Modules accepted: Orders ? ?

## 2021-10-08 NOTE — Telephone Encounter (Signed)
Patient aware of labs, lab visit scheduled.  ? ?Can you order it for me please? I am still having issues with access to some things.  ? ?I appreciate it! ?

## 2021-10-08 NOTE — Telephone Encounter (Signed)
-----  Message from Midge Minium, MD sent at 10/07/2021  8:31 PM EDT ----- ?Alk phos continues to improve.  This is good news! ? ?Sodium is a little low.  Please make sure you are drinking plenty of water.  We will repeat your sodium level at a lab only visit in 1-2 weeks ? ?Remainder of labs look good! ?

## 2021-10-22 ENCOUNTER — Other Ambulatory Visit (INDEPENDENT_AMBULATORY_CARE_PROVIDER_SITE_OTHER): Payer: Commercial Managed Care - PPO

## 2021-10-22 DIAGNOSIS — E871 Hypo-osmolality and hyponatremia: Secondary | ICD-10-CM | POA: Diagnosis not present

## 2021-10-22 LAB — BASIC METABOLIC PANEL
BUN: 17 mg/dL (ref 6–23)
CO2: 27 mEq/L (ref 19–32)
Calcium: 8.9 mg/dL (ref 8.4–10.5)
Chloride: 99 mEq/L (ref 96–112)
Creatinine, Ser: 1.03 mg/dL (ref 0.40–1.20)
GFR: 58.01 mL/min — ABNORMAL LOW (ref >60.00)
Glucose, Bld: 101 mg/dL — ABNORMAL HIGH (ref 70–99)
Potassium: 3.8 mEq/L (ref 3.5–5.1)
Sodium: 135 mEq/L (ref 135–145)

## 2021-10-25 ENCOUNTER — Telehealth: Payer: Self-pay

## 2021-10-25 NOTE — Telephone Encounter (Signed)
-----   Message from Midge Minium, MD sent at 10/24/2021 11:06 AM EDT ----- ?Sodium is now normal.  Great news! ?

## 2021-10-25 NOTE — Telephone Encounter (Signed)
Spoke w/ pt and informed her of her lab results. Pt expressed verbal understanding  ?

## 2021-11-05 ENCOUNTER — Encounter: Payer: Self-pay | Admitting: Certified Registered Nurse Anesthetist

## 2021-11-11 ENCOUNTER — Ambulatory Visit (AMBULATORY_SURGERY_CENTER): Payer: Commercial Managed Care - PPO | Admitting: Internal Medicine

## 2021-11-11 ENCOUNTER — Encounter: Payer: Self-pay | Admitting: Internal Medicine

## 2021-11-11 VITALS — BP 124/73 | HR 50 | Temp 95.5°F | Resp 13 | Ht 66.0 in | Wt 200.0 lb

## 2021-11-11 DIAGNOSIS — R970 Elevated carcinoembryonic antigen [CEA]: Secondary | ICD-10-CM | POA: Diagnosis not present

## 2021-11-11 DIAGNOSIS — K219 Gastro-esophageal reflux disease without esophagitis: Secondary | ICD-10-CM | POA: Diagnosis present

## 2021-11-11 DIAGNOSIS — K317 Polyp of stomach and duodenum: Secondary | ICD-10-CM

## 2021-11-11 MED ORDER — SODIUM CHLORIDE 0.9 % IV SOLN: 500.0000 mL | Freq: Once | INTRAVENOUS | Status: DC

## 2021-11-11 MED ORDER — FLEET ENEMA 7-19 GM/118ML RE ENEM
1.0000 | ENEMA | Freq: Once | RECTAL | Status: AC
  Administered 2021-11-11: 1 via RECTAL

## 2021-11-11 NOTE — Progress Notes (Signed)
Called to room to assist during endoscopic procedure.  Patient ID and intended procedure confirmed with present staff. Received instructions for my participation in the procedure from the performing physician.  

## 2021-11-11 NOTE — Progress Notes (Signed)
Report given to PACU, vss 

## 2021-11-11 NOTE — Progress Notes (Signed)
[facility] History and Physical   Primary Care Physician:  Midge Minium, MD   Reason for Procedure:   Elevated CEA, GERD  Plan:    Colonoscopy and EGD     HPI: Julie Ross is a 61 y.o. female to have colonoscopy for evaluation of an elevated CEA level.  She had labs done in early March through her insurance company and a CEA level was drawn.  It was slightly elevated 6.1.  Her last colonoscopy in 2016 at [facility] was normal.  She says that she will occasionally get diarrhea within 30 minutes after eating, but otherwise no change in bowel habits.  She does not see any blood in her stool.  No abdominal pains.  She does admit to heartburn or reflux, for which she has been on Prilosec 40 mg daily for quite some time.  She had some episodes where it woke her from sleep at night.  She denies any dysphagia.  Hemoglobin is normal.  She does note that she had an EGD several years ago.   Referred here by Dr. Birdie Riddle.  She fell - tripped this AM. Right wrist sore and both knees. Some pain with deep breath R ant chest. No LOC    Past Medical History:  Diagnosis Date   Anemia    h/o - ? bld. transfusion - more recent- ?2013   Anxiety    uses xanax mainly for sleep   Arthritis    knees    Basal cell carcinoma 01/11/2005   rim left nostril-,mohs   BCC (basal cell carcinoma) 01/11/2005   right cheek mid-mohs   BCC (basal cell carcinoma) 09/12/2006   right forehead -mohs   BCC (basal cell carcinoma) 01/19/2016   right upper arm -cx5f   BCC (basal cell carcinoma) 02/18/2019   forehead-mohs   [facility]   lul/ER+PR+, x1 lymph node    Cancer (HCC)    squamous cell skin cancer x7   Complication of anesthesia    Depression    GERD (gastroesophageal reflux disease)    H/O bone density study 03/2011   H/O colonoscopy    H/O echocardiogram    last one 02/2013, due to chemotherapy   Heart burn    Hx of radiation therapy 04/17/12- 06/04/12   left chest  wall, axilla, L supraclavicular fossa 4500 cGy, left mastectomy scar/chest wall 5940 cGy   Hypertension    Hypothyroidism    IBS (irritable bowel syndrome)    [facility] [place] 08/[place]   Multiple sclerosis (HCC)    Ne[facility]romuscular disorder ([facility]    MS TX WITH CYMBALTA    Personal history of chemotherapy    Personal history of radiation therapy    SCC (squamous cell carcinoma) 01/11/2005   chest mid   Sleep apnea    no CPAP   Squamous cell carcinoma of skin 01/11/2005   over left lip   Wears glasses     Past Surgical History:  Procedure Laterality Date   APPLICATION OF ROBOTIC ASSISTANCE FOR SPINAL PROCEDURE N/A 03/12/2019   Procedure: APPLICATION OF ROBOTIC ASSISTANCE FOR SPINAL PROCEDURE;  Surgeon: OJudith Part MD;  [facility]  Service: Neurosurgery;  Laterality: N/A;   BREAST REDUCTION SURGERY Right 01/25/2021   Procedure: MAMMARY REDUCTION  (BREAST);  Surgeon: TIrene Limbo MD;  [facility]  Service: Plastics;  Laterality: Right;   CESAREAN SECTION     X2    COLONOSCOPY     ESOPHAGOGASTRODUODENOSCOPY  FLEXIBLE SIGMOIDOSCOPY     I & D KNEE WITH POLY EXCHANGE  03/02/2012   Procedure: IRRIGATION AND DEBRIDEMENT KNEE WITH POLY EXCHANGE;  Surgeon: Alta Corning, MD;  Location: [place];  Service: Orthopedics;  Laterality: Left;  I&D of total knee with Possible Poly Exchange    JOINT REPLACEMENT  07/22/2011   left knee- multiple surgeries    KNEE ARTHROSCOPY     LT KNEE 04/2011   KNEE ARTHROSCOPY  06/20/1982   rt   MASTECTOMY     MASTECTOMY W/ SENTINEL NODE BIOPSY  12/19/2011   Procedure: MASTECTOMY WITH SENTINEL LYMPH NODE BIOPSY;  Surgeon: Haywood Lasso, MD;  Location: [place];  Service: General;  Laterality: Left;  left breast and left axilla    MOHS SURGERY     right face   PORT-A-CATH REMOVAL  03/06/2012   Procedure: REMOVAL PORT-A-CATH;  Surgeon: Odis Hollingshead, MD;  Location: [place];  Service: General;  Laterality: N/A;    PORT-A-CATH REMOVAL Right 08/22/2012   Procedure: REMOVAL PORT-A-CATH;  Surgeon: Edward Jolly, MD;  Location: [place];  Service: General;  Laterality: Right;   PORTACATH PLACEMENT  01/16/2012   Procedure: INSERTION PORT-A-CATH;  Surgeon: Haywood Lasso, MD;  Location: [place];  Service: General;  Laterality: Right;  Porta Cath Placement    PORTACATH PLACEMENT  05/01/2012   Procedure: INSERTION PORT-A-CATH;  Surgeon: Haywood Lasso, MD;  Location: [place];  Service: General;  Laterality: Right;  right internal jugular port-a-cath insertion   TEE WITHOUT CARDIOVERSION  03/06/2012   Procedure: TRANSESOPHAGEAL ECHOCARDIOGRAM (TEE);  Surgeon: Josue Hector, MD;  Location: [place];  Service: Cardiovascular;  Laterality: N/A;  Rm. [id number]   TONSILLECTOMY     TOTAL KNEE ARTHROPLASTY  08/15/2011   Procedure: TOTAL KNEE ARTHROPLASTY; lft Surgeon: Alta Corning, MD;  Location: [place];  Service: Orthopedics;  Laterality: Left;  RIGHT KNEE CORTIZONE INJECTION   TOTAL KNEE ARTHROPLASTY Left 08/18/2012   Procedure:  Irrigation and debridement of LEFT total knee;  Removal of total knee parts; Implant of spacers;  Surgeon: Kerin Salen, MD;  Location: [place];  Service: Orthopedics;  Laterality: Left;   TOTAL KNEE ARTHROPLASTY Right 08/12/2013   TOTAL KNEE ARTHROPLASTY Right 08/12/2013   Procedure: RIGHT TOTAL KNEE ARTHROPLASTY;  Surgeon: Kerin Salen, MD;  Location: [place];  Service: Orthopedics;  Laterality: Right;   TOTAL KNEE REVISION Left 04/19/2013   Procedure: TOTAL KNEE REVISION AND REMOVAL CEMENT SPACER;  Surgeon: Kerin Salen, MD;  Location: [place];  Service: Orthopedics;  Laterality: Left;   TRANSFORAMINAL LUMBAR INTERBODY FUSION (TLIF) WITH PEDICLE SCREW FIXATION 1 LEVEL Bilateral 03/12/2019   Procedure: Lumbar four-five Open transforaminal lumbar interbody fusion with Lumbar four-five laminectomy, bilateral Lumbar four-five Pedicle screw placement;   Surgeon: Judith Part, MD;  Location: [place];  Service: Neurosurgery;  Laterality: Bilateral;   TUMOR REMOVAL     FROM PELVIS AGE 56    Prior to Admission medications   Medication Sig Start Date End Date Taking? Authorizing Provider  Ascorbic Acid (VITAMIN C PO) Take by mouth.   Yes [provider]  aspirin EC 81 MG tablet Take 1 tablet (81 mg total) by mouth daily. Swallow whole. 06/29/21  Yes Loel Dubonnet, NP  atorvastatin (LIPITOR) 20 MG tablet TAKE 1 TABLET DAILY 08/10/21  Yes Midge Minium, MD  baclofen (LIORESAL) 10 MG tablet TAKE 1 TABLET THREE TIMES A DAY AS  NEEDED FOR MUSCLE SPASMS 12/09/20  Yes Sater, Nanine Means, MD  buPROPion (WELLBUTRIN XL) 150 MG 24 hr tablet TAKE 1 TABLET DAILY 07/05/21  Yes Midge Minium, MD  clonazePAM (KLONOPIN) 1 MG tablet TAKE 1 TABLET(1 MG) BY MOUTH AT BEDTIME 08/17/21  Yes Sater, Nanine Means, MD  gabapentin (NEURONTIN) 600 MG tablet TAKE 1 TABLET THREE TIMES A DAY 12/09/20  Yes Sater, Nanine Means, MD  KLOR-CON M20 20 MEQ tablet TAKE 1 TABLET DAILY 07/16/21  Yes Midge Minium, MD  levothyroxine (SYNTHROID) 88 MCG tablet TAKE 1 TABLET DAILY BEFORE BREAKFAST 08/18/21  Yes Midge Minium, MD  losartan-hydrochlorothiazide (HYZAAR) 100-25 MG tablet TAKE 1 TABLET EVERY MORNING BEFORE BREAKFAST 08/10/21  Yes Midge Minium, MD  omeprazole (PRILOSEC) 40 MG capsule TAKE 1 CAPSULE DAILY 08/16/21  Yes Midge Minium, MD  sertraline (ZOLOFT) 50 MG tablet Take 1 tablet (50 mg total) by mouth daily. 10/06/21  Yes Midge Minium, MD  EPINEPHrine 0.3 mg/0.3 mL IJ SOAJ injection Inject 0.3 mg into the muscle as needed for anaphylaxis. Patient not taking: Reported on 11/11/2021 12/08/20   Midge Minium, MD  HYDROcodone-acetaminophen (NORCO/VICODIN) 5-325 MG tablet SMARTSIG:1 Tablet(s) By Mouth Every 12 Hours PRN Patient not taking: Reported on 11/11/2021 08/12/21   [provider]  ondansetron (ZOFRAN) 4 MG tablet Take 1  tablet (4 mg total) by mouth every 8 (eight) hours as needed for nausea or vomiting. Patient not taking: Reported on 11/11/2021 04/01/21   Midge Minium, MD    Current Outpatient Medications  Medication Sig Dispense Refill   Ascorbic Acid (VITAMIN C PO) Take by mouth.     aspirin EC 81 MG tablet Take 1 tablet (81 mg total) by mouth daily. Swallow whole. 90 tablet 3   atorvastatin (LIPITOR) 20 MG tablet TAKE 1 TABLET DAILY 90 tablet 3   baclofen (LIORESAL) 10 MG tablet TAKE 1 TABLET THREE TIMES A DAY AS NEEDED FOR MUSCLE SPASMS 270 tablet 3   buPROPion (WELLBUTRIN XL) 150 MG 24 hr tablet TAKE 1 TABLET DAILY 90 tablet 3   clonazePAM (KLONOPIN) 1 MG tablet TAKE 1 TABLET(1 MG) BY MOUTH AT BEDTIME 90 tablet 0   gabapentin (NEURONTIN) 600 MG tablet TAKE 1 TABLET THREE TIMES A DAY 270 tablet 3   KLOR-CON M20 20 MEQ tablet TAKE 1 TABLET DAILY 90 tablet 3   levothyroxine (SYNTHROID) 88 MCG tablet TAKE 1 TABLET DAILY BEFORE BREAKFAST 90 tablet 3   losartan-hydrochlorothiazide (HYZAAR) 100-25 MG tablet TAKE 1 TABLET EVERY MORNING BEFORE BREAKFAST 90 tablet 3   omeprazole (PRILOSEC) 40 MG capsule TAKE 1 CAPSULE DAILY 90 capsule 3   sertraline (ZOLOFT) 50 MG tablet Take 1 tablet (50 mg total) by mouth daily. 90 tablet 3   EPINEPHrine 0.3 mg/0.3 mL IJ SOAJ injection Inject 0.3 mg into the muscle as needed for anaphylaxis. (Patient not taking: Reported on 11/11/2021) 1 each 3   HYDROcodone-acetaminophen (NORCO/VICODIN) 5-325 MG tablet SMARTSIG:1 Tablet(s) By Mouth Every 12 Hours PRN (Patient not taking: Reported on 11/11/2021)     ondansetron (ZOFRAN) 4 MG tablet Take 1 tablet (4 mg total) by mouth every 8 (eight) hours as needed for nausea or vomiting. (Patient not taking: Reported on 11/11/2021) 20 tablet 0   Current Facility-Administered Medications  Medication Dose Route Frequency Provider Last Rate Last Admin   0.9 %  sodium chloride infusion  500 mL Intravenous Once Gatha Mayer, MD         Allergies  as of 11/11/2021 - Review Complete 11/11/2021  Allergen Reaction Noted   Bee venom Anaphylaxis 06/18/2015   Fentanyl Other (See Comments) 01/19/2021    Family History  Problem Relation Age of Onset   Arthritis Mother    Hypertension Mother    Heart disease Mother        CHF   Dementia Father    Diabetes type II Father    Heart disease Maternal Grandfather    Breast cancer Paternal Grandmother        dx in her 52s   Lung cancer Maternal Uncle        smoker   Breast cancer Paternal Aunt        dx in her 28s   Ovarian cancer Paternal Aunt        dx in her 49s   Colon cancer Neg Hx    Stomach cancer Neg Hx    Esophageal cancer Neg Hx     Social History   Socioeconomic History   Marital status: Married    Spouse name: Not on file   Number of children: 2   Years of education: Not on file   Highest education level: Not on file  Occupational History    Employer: [employer]    Occupation: disabled  Tobacco Use   Smoking status: Never   Smokeless tobacco: Never  Vaping Use   Vaping Use: Never used  Substance and Sexual Activity   Alcohol use: No   Drug use: No   Sexual activity: Yes    Birth control/protection: Post-menopausal  Other Topics Concern   Not on file  Social History Narrative   Not on file   Social Determinants of Health   Financial Resource Strain: Not on file  Food Insecurity: Not on file  Transportation Needs: Not on file  Physical Activity: Not on file  Stress: Not on file  Social Connections: Not on file  Intimate Partner Violence: Not on file    Review of Systems:  All other review of systems negative except as mentioned in the HPI.  Physical Exam: Vital signs BP 130/69   Pulse (!) 50   Temp (!) 95.5 F (35.3 C)   Ht '5\' 6"'$  (1.676 m)   Wt 200 lb (90.7 kg)   LMP 12/13/2011   SpO2 99%   BMI 32.28 kg/m   General:   Alert,  Well-developed, well-nourished, pleasant and cooperative in NAD Lungs:  Clear throughout  to auscultation. Chest wall nontender  Heart:  Regular rate and rhythm; no murmurs, clicks, rubs,  or gallops. Abdomen:  Soft, nontender and nondistended. Normal bowel sounds.   Neuro/Psych:  Alert and cooperative. Normal mood and affect. A and O x 3 R wrist nontender and no trauma and FROM Knees w/ sl bruise on left - very small  '@Shresta Risden'$  Simonne Maffucci, MD, [facility] [phone] (pager) 11/11/2021 10:21 AM@

## 2021-11-11 NOTE — Progress Notes (Signed)
1140 Robinul 0.1 mg IV given due large amount of secretions upon assessment.  MD made aware, vss  

## 2021-11-11 NOTE — Op Note (Signed)
[facility] Patient Name: Julie Ross Procedure Date: 11/11/2021 10:23 AM MRN: [id number] Endoscopist: Gatha Mayer , MD Age: 60 Referring MD:  Date of Birth: [birth year] Gender: Female Account #: [id number] Procedure:                Upper GI endoscopy Indications:              Esophageal reflux symptoms that persist despite                            appropriate therapy, Elevated CEA on life insurance                            testing Medicines:                Monitored Anesthesia Care Procedure:                Pre-Anesthesia Assessment:                           - Prior to the procedure, a History and Physical                            was performed, and patient medications and                            allergies were reviewed. The patient's tolerance of                            previous anesthesia was also reviewed. The risks                            and benefits of the procedure and the sedation                            options and risks were discussed with the patient.                            All questions were answered, and informed consent                            was obtained. Prior Anticoagulants: The patient has                            taken no previous anticoagulant or antiplatelet                            agents. ASA Grade Assessment: II - A patient with                            mild systemic disease. After reviewing the risks                            and benefits, the patient was deemed in  satisfactory condition to undergo the procedure.                           After obtaining informed consent, the endoscope was                            passed under direct vision. Throughout the                            procedure, the patient's blood pressure, pulse, and                            oxygen saturations were monitored continuously. The                            Olympus [id number] was introduced through the  mouth,                            and advanced to the second part of duodenum. The                            upper GI endoscopy was accomplished without                            difficulty. The patient tolerated the procedure                            well. Scope In: Scope Out: Findings:                 The examined esophagus was normal.                           Multiple diminutive sessile polyps were found in                            the gastric fundus and in the gastric body.                            Biopsies were taken with a cold forceps for                            histology. Verification of patient identification                            for the specimen was done. Estimated blood loss was                            minimal.                           The examined duodenum was normal.                           The cardia and gastric fundus were otherwise normal  on retroflexion. Complications:            No immediate complications. Estimated Blood Loss:     Estimated blood loss was minimal. Impression:               - Normal esophagus.                           - Multiple gastric polyps. Biopsied. Look like                            innocent fundic gland polyps                           - Normal examined duodenum. Recommendation:           - Patient has a contact number available for                            emergencies. The signs and symptoms of potential                            delayed complications were discussed with the                            patient. Return to normal activities tomorrow.                            Written discharge instructions were provided to the                            patient.                           - See the other procedure note for documentation of                            additional recommendations. colonoscopy next                           - Follow an antireflux regimen.                            - Do not lie down for at least 3 to 4 hours after                            meals.                           - Raise the head of the bed 4 to 6 inches.                           - Decrease excess weight.                           - Avoid citrus juices and other acidic foods,  alcohol, chocolate, mints, coffee and other                            caffeinated beverages, carbonated beverages, fatty                            and fried foods.                           - Avoid tight-fitting clothing.                           - Avoid cigarettes and other tobacco products. Gatha Mayer, MD 11/11/2021 12:16:32 PM This report has been signed electronically.

## 2021-11-11 NOTE — Patient Instructions (Addendum)
Nothing bad seen. No signs of cancer.  There were some stomach polyps that look totally innocent. I took biopsies to check them.  Colonoscopy normal.  I do not know why the CEA was slightly elevated - but I do know that can occur and not mean anything.  You should follow a GERD diet strictly and especially in the evening to reduce nocturnal reflux. You can try taking OTC Pepcid 20 mg 1-2 before bedtime to help that. Would consider rasing head of bed also.  I appreciate the opportunity to care for you. Gatha Mayer, MD, FACG   YOU HAD AN ENDOSCOPIC PROCEDURE TODAY AT [facility]:   Refer to the procedure report that was given to you for any specific questions about what was found during the examination.  If the procedure report does not answer your questions, please call your gastroenterologist to clarify.  If you requested that your care partner not be given the details of your procedure findings, then the procedure report has been included in a sealed envelope for you to review at your convenience later.  YOU SHOULD EXPECT: Some feelings of bloating in the abdomen. Passage of more gas than usual.  Walking can help get rid of the air that was put into your GI tract during the procedure and reduce the bloating. If you had a lower endoscopy (such as a colonoscopy or flexible sigmoidoscopy) you may notice spotting of blood in your stool or on the toilet paper. If you underwent a bowel prep for your procedure, you may not have a normal bowel movement for a few days.  Please Note:  You might notice some irritation and congestion in your nose or some drainage.  This is from the oxygen used during your procedure.  There is no need for concern and it should clear up in a day or so.  SYMPTOMS TO REPORT IMMEDIATELY:  Following lower endoscopy (colonoscopy or flexible sigmoidoscopy):  Excessive amounts of blood in the stool  Significant tenderness or worsening of abdominal  pains  Swelling of the abdomen that is new, acute  Fever of 100F or higher  Following upper endoscopy (EGD)  Vomiting of blood or coffee ground material  New chest pain or pain under the shoulder blades  Painful or persistently difficult swallowing  New shortness of breath  Fever of 100F or higher  Black, tarry-looking stools  For urgent or emergent issues, a gastroenterologist can be reached at any hour by calling [phone]. Do not use MyChart messaging for urgent concerns.    DIET:  We do recommend a small meal at first, but then you may proceed to your regular diet.  Drink plenty of fluids but you should avoid alcoholic beverages for 24 hours.  ACTIVITY:  You should plan to take it easy for the rest of today and you should NOT DRIVE or use heavy machinery until tomorrow (because of the sedation medicines used during the test).    FOLLOW UP: Our staff will call the number listed on your records 48-72 hours following your procedure to check on you and address any questions or concerns that you may have regarding the information given to you following your procedure. If we do not reach you, we will leave a message.  We will attempt to reach you two times.  During this call, we will ask if you have developed any symptoms of COVID 19. If you develop any symptoms (ie: fever, flu-like symptoms, shortness of breath, cough etc.)  before then, please call [phone].  If you test positive for Covid 19 in the 2 weeks post procedure, please call and report this information to [place].    If any biopsies were taken you will be contacted by phone or by letter within the next 1-3 weeks.  Please call us at [phone] if you have not heard about the biopsies in 3 weeks.    SIGNATURES/CONFIDENTIALITY: You and/or your care partner have signed paperwork which will be entered into your electronic medical record.  These signatures attest to the fact that that the information above on your After Visit  Summary has been reviewed and is understood.  Full responsibility of the confidentiality of this discharge information lies with you and/or your care-partner.

## 2021-11-11 NOTE — Op Note (Signed)
[facility] Patient Name: Julie Ross Procedure Date: 11/11/2021 10:15 AM MRN: [id number] Endoscopist: Gatha Mayer , MD Age: 61 Referring MD:  Date of Birth: [birth year] Gender: Female Account #: [id number] Procedure:                Colonoscopy Indications:              Elevated CEA Medicines:                Propofol per Anesthesia, Monitored Anesthesia Care Procedure:                Pre-Anesthesia Assessment:                           - Prior to the procedure, a History and Physical                            was performed, and patient medications and                            allergies were reviewed. The patient's tolerance of                            previous anesthesia was also reviewed. The risks                            and benefits of the procedure and the sedation                            options and risks were discussed with the patient.                            All questions were answered, and informed consent                            was obtained. Prior Anticoagulants: The patient has                            taken no previous anticoagulant or antiplatelet                            agents. ASA Grade Assessment: II - A patient with                            mild systemic disease. After reviewing the risks                            and benefits, the patient was deemed in                            satisfactory condition to undergo the procedure.                           After obtaining informed consent, the colonoscope  was passed under direct vision. Throughout the                            procedure, the patient's blood pressure, pulse, and                            oxygen saturations were monitored continuously. The                            Olympus #[id number] was introduced through the anus                            and advanced to the the cecum, identified by                            appendiceal orifice and  ileocecal valve. The                            quality of the bowel preparation was adequate. The                            colonoscopy was performed without difficulty. The                            patient tolerated the procedure well. The bowel                            preparation used was Miralax via split dose                            instruction. Scope In: 11:54:59 AM Scope Out: 12:08:32 PM Scope Withdrawal Time: 0 hours 9 minutes 8 seconds  Total Procedure Duration: 0 hours 13 minutes 33 seconds  Findings:                 The perianal and digital rectal examinations were                            normal.                           The colon (entire examined portion) appeared normal.                           No additional abnormalities were found on                            retroflexion. Complications:            No immediate complications. Estimated blood loss:                            None. Estimated Blood Loss:     Estimated blood loss: none. Impression:               - The entire examined colon is normal.                           -  No specimens collected. Recommendation:           - Repeat colonoscopy in 10 years for screening                            purposes.                           - Patient has a contact number available for                            emergencies. The signs and symptoms of potential                            delayed complications were discussed with the                            patient. Return to normal activities tomorrow.                            Written discharge instructions were provided to the                            patient.                           - Resume previous diet.                           - Continue present medications. Gatha Mayer, MD 11/11/2021 12:19:39 PM This report has been signed electronically.

## 2021-11-12 ENCOUNTER — Telehealth: Payer: Self-pay | Admitting: *Deleted

## 2021-11-12 NOTE — Telephone Encounter (Signed)
  Follow up Call-     11/11/2021   10:05 AM  Call back number  Post procedure Call Back phone  # [phone]  Permission to leave phone message Yes     Patient questions:  Do you have a fever, pain , or abdominal swelling? No. Pain Score  0 *  Have you tolerated food without any problems? Yes.    Have you been able to return to your normal activities? Yes.    Do you have any questions about your discharge instructions: Diet   No. Medications  No. Follow up visit  No.  Do you have questions or concerns about your Care? No.  Actions: * If pain score is 4 or above: No action needed, pain <4.

## 2021-11-13 ENCOUNTER — Other Ambulatory Visit: Payer: Self-pay | Admitting: Neurology

## 2021-11-17 ENCOUNTER — Encounter: Payer: Self-pay | Admitting: Neurology

## 2021-11-17 ENCOUNTER — Other Ambulatory Visit: Payer: Self-pay | Admitting: Neurology

## 2021-11-29 LAB — HM MAMMOGRAPHY

## 2021-11-30 ENCOUNTER — Encounter: Payer: Self-pay | Admitting: Neurology

## 2021-11-30 ENCOUNTER — Ambulatory Visit: Payer: Commercial Managed Care - PPO | Admitting: Neurology

## 2021-11-30 VITALS — BP 138/82 | HR 63 | Ht 66.0 in | Wt 200.0 lb

## 2021-11-30 DIAGNOSIS — G4700 Insomnia, unspecified: Secondary | ICD-10-CM

## 2021-11-30 DIAGNOSIS — R5383 Other fatigue: Secondary | ICD-10-CM

## 2021-11-30 DIAGNOSIS — G35 Multiple sclerosis: Secondary | ICD-10-CM | POA: Diagnosis not present

## 2021-11-30 DIAGNOSIS — G4733 Obstructive sleep apnea (adult) (pediatric): Secondary | ICD-10-CM

## 2021-11-30 DIAGNOSIS — D329 Benign neoplasm of meninges, unspecified: Secondary | ICD-10-CM

## 2021-11-30 NOTE — Progress Notes (Signed)
[facility]  PATIENT: Julie Ross DOB: [birth year]  REFERRING DOCTOR OR PCP:  Annye Asa SOURCE: patient and records from [facility]  _________________________________   HISTORICAL  CHIEF COMPLAINT:  Chief Complaint  Patient presents with   Follow-up    Pt with, rm 1. Had a recent fall. She notes generalized weakness and unsteady gait. Off DMT    HISTORY OF PRESENT ILLNESS:  Julie Ross is a 61 y.o. woman with multiple sclerosis and breast cancer.    Update 11/30/2021: She is noting more trouble with her gait and balance.   He had a fall a couple weeks ago but is not sure how she fell.   She hurt her right upper chest but no fracture on x-ray.    She has other stumbles but not falls.    She has trouble with stairs and tries to just do tem once a day.    She feels balance is a bigger issue than strength.   She denies numbness.    Bladder function is unchanged.     The gait changes have been gradual.    She has been off a DMT (she had been on Chemo for breast cancer in 2013/2014 and stopped around that time.   .  She has no recent exacerbations.  She continues to note some progression with reduced gait and balance.  She does not note much weakness or dysesthesias.  Bladder is doing well.  MS lesions on the MRI of the brain 10/01/2020 was unchanged compared to the MRI 06/16/2020 and 04/13/2018 MRI is also showed meningiomas that have increased in size.   She also has meningiomas and sees Dr. Sherrilee Gilles.  She had stereotactic radiosurgery 10/06/2020 for the meningiomas (left sphenoid wing and left tentorial)   Due to more fatigue.  We did a PSG.  It showed minimal OSA with an AHI of 2.5.  However, she had moderate REM OSA but very little REM sleep was recorded.  Insomnia is better with clonazepam and this helps her sleep through the night better than Xanax did.     She had breast cancer in 2013.   She was seeing Dr. Lindi Adie.   She has no recurrence and is off  all medications for it now.   She had neurosurgery at L4L5 (had synovial cyst and spondylolisthesis; Dr. Zada Finders).   She did need to get Narcan after she was unable to be aroused after opiates post-op.  She is doing better but does note buttock pain if sitting a long time.     She tries to exercise some with a recombent bike  Polysomnogram 11/21/2022showed:  Negligible overall Obstructive Sleep Apnea with AHI = 2.5.  However, there was moderate REM related OSA with a REM AHI = 22. No significant PLMS Delayed sleep latency.  Normal sleep efficiency. All stages of sleep were recorded.   MS History:    She was diagnosed with MS in 1999 after presenting with optic neuritis.   She had a spinal tap, MRI and angiogram.   Initially, the diagnosis was uncertain but she had changes in the MRI over time leading to a diagnosis of MS a few years later.    Initially, she was placed on Copaxone, then switched to Avonex due to skin reactions.   She then switched to Tysabri when she had some mild breakthrough disease. She tolerated Tysabri very well and her MS did well.  However, she was JCV antibody positive so she stopped in 2012.  She did not restart Avonex as she did not want to go back to an injection.    Her last exacerbation was optic neuritis in 2012 treated with steroids. She proved but did not get back to baseline.  This exacerbation occurred off any DMT.  She tried Gilenya in 2013. She had trouble tolerating Gilenya and discontinued shortly after starting it. She was going to start Tecfidera but was diagnosed with breast cancer in 2013 and opted not to start at that time. She received chemotherapy including Taxotere, carboplatin, Herceptin in 2013 and 2014. An MRI of the brain in 2015 showed only mild MS progression compared to her previous one in 2011.   She stopped disease modifying therapies in 2014 and 2015.  Subsequent MRIs have been stable.  IMAGING: MRI of the brain 04/13/2018 showed multiple  T2/FLAIR hyperintense foci in the brainstem, cerebellum and hemispheres in a pattern and configuration consistent with demyelinating plaque associated with multiple sclerosis.  One focus in the right splenium was not present on the 2017 MRI and shows some enhancement without edema consistent with a subacute demyelinating plaque.  Other foci were present on the 2017 MRI.     Two meningeal based foci (left sphenoid wing and left tentorium) as described above consistent with meningiomas.  They are described as the same size on the 2018 MRI report.   Complex stable pineal cyst  MRI of the brain 06/16/2020 showed stable MS lesions.  These meningiomas had increased in size compared to 2019  MRI of the brain 10/01/2020 showed no further changes compared to 06/16/2020  REVIEW OF SYSTEMS: Constitutional: No fevers, chills, sweats, or change in appetite.  Notes fatigue Eyes: No visual changes, double vision, eye pain Ear, nose and throat: No hearing loss, ear pain, nasal congestion, sore throat.  Recent URI Cardiovascular: No chest pain, palpitations Respiratory:  No shortness of breath at rest or with exertion.   No wheezes.  Has OSA GastrointestinaI: No nausea, vomiting, diarrhea, abdominal pain, fecal incontinence Genitourinary:  No dysuria, urinary retention or frequency.  She has 1 times nocturia most nights Musculoskeletal:  No neck pain, back pain.  She has bilateral knee pain Integumentary: No rash, pruritus, skin lesions Neurological: as above Psychiatric: No depression at this time.  Some anxiety Endocrine: No palpitations, diaphoresis, change in appetite, change in weigh or increased thirst Hematologic/Lymphatic:  No anemia, purpura, petechiae. Allergic/Immunologic: No itchy/runny eyes, nasal congestion, recent allergic reactions, rashes  ALLERGIES: Allergies  Allergen Reactions   Bee Venom Anaphylaxis   Fentanyl Other (See Comments)    Very low BP, decrease LOC-received Narcan-in 2020     HOME MEDICATIONS:  Current Outpatient Medications:    Ascorbic Acid (VITAMIN C PO), Take by mouth., Disp: , Rfl:    aspirin EC 81 MG tablet, Take 1 tablet (81 mg total) by mouth daily. Swallow whole., Disp: 90 tablet, Rfl: 3   atorvastatin (LIPITOR) 20 MG tablet, TAKE 1 TABLET DAILY, Disp: 90 tablet, Rfl: 3   baclofen (LIORESAL) 10 MG tablet, Take 1 tablet (10 mg total) by mouth as needed for muscle spasms. PLEASE KEEP UPCOMING APPT FOR CONTINUED REFILLS, Disp: 270 tablet, Rfl: 0   buPROPion (WELLBUTRIN XL) 150 MG 24 hr tablet, TAKE 1 TABLET DAILY, Disp: 90 tablet, Rfl: 3   clonazePAM (KLONOPIN) 1 MG tablet, TAKE 1 TABLET(1 MG) BY MOUTH AT BEDTIME, Disp: 90 tablet, Rfl: 0   EPINEPHrine 0.3 mg/0.3 mL IJ SOAJ injection, Inject 0.3 mg into the muscle as needed for anaphylaxis.,  Disp: 1 each, Rfl: 3   gabapentin (NEURONTIN) 600 MG tablet, Take 1 tablet (600 mg total) by mouth 3 (three) times daily. PLEASE KEEP UPCOMING APPT FOR CONTINUED REFILLS, Disp: 270 tablet, Rfl: 0   HYDROcodone-acetaminophen (NORCO/VICODIN) 5-325 MG tablet, , Disp: , Rfl:    KLOR-CON M20 20 MEQ tablet, TAKE 1 TABLET DAILY, Disp: 90 tablet, Rfl: 3   levothyroxine (SYNTHROID) 88 MCG tablet, TAKE 1 TABLET DAILY BEFORE BREAKFAST, Disp: 90 tablet, Rfl: 3   losartan-hydrochlorothiazide (HYZAAR) 100-25 MG tablet, TAKE 1 TABLET EVERY MORNING BEFORE BREAKFAST, Disp: 90 tablet, Rfl: 3   omeprazole (PRILOSEC) 40 MG capsule, TAKE 1 CAPSULE DAILY, Disp: 90 capsule, Rfl: 3   ondansetron (ZOFRAN) 4 MG tablet, Take 1 tablet (4 mg total) by mouth every 8 (eight) hours as needed for nausea or vomiting., Disp: 20 tablet, Rfl: 0   sertraline (ZOLOFT) 50 MG tablet, Take 1 tablet (50 mg total) by mouth daily., Disp: 90 tablet, Rfl: 3  PAST MEDICAL HISTORY: Past Medical History:  Diagnosis Date   Anemia    h/o - ? bld. transfusion - more recent- ?2013   Anxiety    uses xanax mainly for sleep   Arthritis    knees    Basal cell carcinoma  01/11/2005   rim left nostril-,mohs   BCC (basal cell carcinoma) 01/11/2005   right cheek mid-mohs   BCC (basal cell carcinoma) 09/12/2006   right forehead -mohs   BCC (basal cell carcinoma) 01/19/2016   right upper arm -[id number]   BCC (basal cell carcinoma) 02/18/2019   forehead-mohs   Breast cancer ([facility] 11/2011   lul/ER+PR+, x1 lymph node    Cancer (HCC)    squamous cell skin cancer x7   Complication of anesthesia    Depression    GERD (gastroesophageal reflux disease)    H/O bone density study 03/2011   H/O colonoscopy    H/O echocardiogram    last one 02/2013, due to chemotherapy   Heart burn    Hx of radiation therapy 04/17/12- 06/04/12   left chest wall, axilla, L supraclavicular fossa 4500 cGy, left mastectomy scar/chest wall 5940 cGy   Hypertension    Hypothyroidism    IBS (irritable bowel syndrome)    Meningioma ([facility] 08/2020   Multiple sclerosis (HCC)    Neuromuscular disorder ([place]    MS TX WITH CYMBALTA    Personal history of chemotherapy    Personal history of radiation therapy    SCC (squamous cell carcinoma) 01/11/2005   chest mid   Sleep apnea    no CPAP   Squamous cell carcinoma of skin 01/11/2005   over left lip   Wears glasses     PAST SURGICAL HISTORY: Past Surgical History:  Procedure Laterality Date   APPLICATION OF ROBOTIC ASSISTANCE FOR SPINAL PROCEDURE N/A 03/12/2019   Procedure: APPLICATION OF ROBOTIC ASSISTANCE FOR SPINAL PROCEDURE;  Surgeon: OJudith Part MD;  [facility] [facility]  Service: Neurosurgery;  Laterality: N/A;   BREAST REDUCTION SURGERY Right 01/25/2021   Procedure: MAMMARY REDUCTION  (BREAST);  Surgeon: TIrene Limbo MD;  [facility]  Service: Plastics;  Laterality: Right;   CESAREAN SECTION     X2    COLONOSCOPY     ESOPHAGOGASTRODUODENOSCOPY     FLEXIBLE SIGMOIDOSCOPY     I & D KNEE WITH POLY EXCHANGE  03/02/2012   Procedure: IRRIGATION AND DEBRIDEMENT KNEE WITH POLY EXCHANGE;  Surgeon: JAlta Corning MD;  [facility]  Service: Orthopedics;  Laterality: Left;  I&D of total knee with Possible Poly Exchange    JOINT REPLACEMENT  07/22/2011   left knee- multiple surgeries    KNEE ARTHROSCOPY     LT KNEE 04/2011   KNEE ARTHROSCOPY  06/20/1982   rt   MASTECTOMY     MASTECTOMY W/ SENTINEL NODE BIOPSY  12/19/2011   Procedure: MASTECTOMY WITH SENTINEL LYMPH NODE BIOPSY;  Surgeon: Haywood Lasso, MD;  Location: [facility];  Service: General;  Laterality: Left;  left breast and left axilla    MOHS SURGERY     right face   PORT-A-CATH REMOVAL  03/06/2012   Procedure: REMOVAL PORT-A-CATH;  Surgeon: Odis Hollingshead, MD;  Location: [facility];  Service: General;  Laterality: N/A;   PORT-A-CATH REMOVAL Right 08/22/2012   Procedure: REMOVAL PORT-A-CATH;  Surgeon: Edward Jolly, MD;  Location: [facility];  Service: General;  Laterality: Right;   PORTACATH PLACEMENT  01/16/2012   Procedure: INSERTION PORT-A-CATH;  Surgeon: Haywood Lasso, MD;  Location: [facility];  Service: General;  Laterality: Right;  Porta Cath Placement    PORTACATH PLACEMENT  05/01/2012   Procedure: INSERTION PORT-A-CATH;  Surgeon: Haywood Lasso, MD;  Location: [facility];  Service: General;  Laterality: Right;  right internal jugular port-a-cath insertion   TEE WITHOUT CARDIOVERSION  03/06/2012   Procedure: TRANSESOPHAGEAL ECHOCARDIOGRAM (TEE);  Surgeon: Josue Hector, MD;  Location: [facility];  Service: Cardiovascular;  Laterality: N/A;  Rm. [id number]   TONSILLECTOMY     TOTAL KNEE ARTHROPLASTY  08/15/2011   Procedure: TOTAL KNEE ARTHROPLASTY; lft Surgeon: Alta Corning, MD;  Location: [facility];  Service: Orthopedics;  Laterality: Left;  RIGHT KNEE CORTIZONE INJECTION   TOTAL KNEE ARTHROPLASTY Left 08/18/2012   Procedure:  Irrigation and debridement of LEFT total knee;  Removal of total knee parts; Implant of spacers;  Surgeon: Kerin Salen, MD;  Location: [facility];  Service:  Orthopedics;  Laterality: Left;   TOTAL KNEE ARTHROPLASTY Right 08/12/2013   TOTAL KNEE ARTHROPLASTY Right 08/12/2013   Procedure: RIGHT TOTAL KNEE ARTHROPLASTY;  Surgeon: Kerin Salen, MD;  Location: [facility];  Service: Orthopedics;  Laterality: Right;   TOTAL KNEE REVISION Left 04/19/2013   Procedure: TOTAL KNEE REVISION AND REMOVAL CEMENT SPACER;  Surgeon: Kerin Salen, MD;  Location: [facility];  Service: Orthopedics;  Laterality: Left;   TRANSFORAMINAL LUMBAR INTERBODY FUSION (TLIF) WITH PEDICLE SCREW FIXATION 1 LEVEL Bilateral 03/12/2019   Procedure: Lumbar four-five Open transforaminal lumbar interbody fusion with Lumbar four-five laminectomy, bilateral Lumbar four-five Pedicle screw placement;  Surgeon: Judith Part, MD;  Location: [facility];  Service: Neurosurgery;  Laterality: Bilateral;   TUMOR REMOVAL     FROM PELVIS AGE 64    FAMILY HISTORY: Family History  Problem Relation Age of Onset   Arthritis Mother    Hypertension Mother    Heart disease Mother        CHF   Dementia Father    Diabetes type II Father    Heart disease Maternal Grandfather    Breast cancer Paternal Grandmother        dx in her 81s   Lung cancer Maternal Uncle        smoker   Breast cancer Paternal Aunt        dx in her 71s   Ovarian cancer Paternal Aunt        dx in her 68s   Colon cancer Neg  Hx    Stomach cancer Neg Hx    Esophageal cancer Neg Hx     SOCIAL HISTORY:  Social History   Socioeconomic History   Marital status: Married    Spouse name: Not on file   Number of children: 2   Years of education: Not on file   Highest education level: Not on file  Occupational History    Employer: [employer]    Occupation: disabled  Tobacco Use   Smoking status: Never   Smokeless tobacco: Never  Vaping Use   Vaping Use: Never used  Substance and Sexual Activity   Alcohol use: No   Drug use: No   Sexual activity: Yes    Birth control/protection: Post-menopausal  Other Topics Concern    Not on file  Social History Narrative   Not on file   Social Determinants of Health   Financial Resource Strain: Not on file  Food Insecurity: Not on file  Transportation Needs: Not on file  Physical Activity: Not on file  Stress: Not on file  Social Connections: Not on file  Intimate Partner Violence: Not on file     PHYSICAL EXAM  Vitals:   11/30/21 1517  BP: 138/82  Pulse: 63  Weight: 200 lb (90.7 kg)  Height: '5\' 6"'$  (1.676 m)    Body mass index is 32.28 kg/m.   General: The patient is well-developed and well-nourished and in no acute distress.   Reduced range of motion of the lower back.  She is tender in the lower lumbar paraspinal region and over the piriformis/SI region bilaterally   Neurologic Exam  Mental status: The patient is alert and oriented x 3 at the time of the examination. The patient has apparent normal recent and remote memory, with an apparently normal attention span and concentration ability.   Speech is normal.  Cranial nerves: Extraocular movements are full.  Facial strength and sensation is normal. The tongue is midline, and the patient has symmetric elevation of the soft palate. No obvious hearing deficits are noted.  Motor:  Muscle bulk is normal.   Muscle tone is normal.  Strength appears to be normal in the legs except right EHL (4/5)  Sensory: Intact sensation to touch and vibration in legs.    Coordination: Finger-nose-finger is performed well...  Gait and station: Station is normal.   The gait is mildly wide.  Tandem gait is wide.   The Romberg is negative.  Reflexes: Deep tendon reflexes are symmetric and normal bilaterally in arms.  DTRs are absent at the ankles.  ____________________________________________________  ASSESSMENT AND PLAN  Multiple sclerosis ([place])  Meningioma (HCC)  Insomnia, unspecified type  Other fatigue  OSA (obstructive sleep apnea)   1.   She will continue off of her disease modifying therapy.  She  has an MRI of the brain September to follow-up the meningiomas.  She will let us know when she has rhe scan and I will compare with her previous MRI.  If she does have breakthrough changes we will reconsider a disease modifying therapy. 2.   Try to stay active 3.    Continue  clonazepam for insomnia.   If EDS worsens, check HST as the PSG did not record much REM sleep and most of her OSA was during REM 4.  she will return to see [place] in 12 months or sooner if she has new or worsening neurologic symptoms.    Jatavius Ellenwood A. Felecia Shelling, MD, PhD 6/96/2952, 8:41 PM Certified in Neurology,  Clinical Neurophysiology, Sleep Medicine, Pain Medicine and Neuroimaging  [facility] [address], [place], [place] [address] [phone]

## 2022-01-14 ENCOUNTER — Ambulatory Visit (HOSPITAL_BASED_OUTPATIENT_CLINIC_OR_DEPARTMENT_OTHER): Payer: Commercial Managed Care - PPO | Admitting: Family

## 2022-01-18 ENCOUNTER — Encounter: Payer: Self-pay | Admitting: Neurology

## 2022-01-18 ENCOUNTER — Other Ambulatory Visit: Payer: Self-pay | Admitting: *Deleted

## 2022-01-18 MED ORDER — GABAPENTIN 600 MG PO TABS: 600.0000 mg | ORAL_TABLET | Freq: Three times a day (TID) | ORAL | 1 refills | Status: DC

## 2022-01-18 MED ORDER — CLONAZEPAM 1 MG PO TABS: ORAL_TABLET | ORAL | 0 refills | Status: DC

## 2022-01-18 MED ORDER — BACLOFEN 10 MG PO TABS: 10.0000 mg | ORAL_TABLET | ORAL | 1 refills | Status: DC | PRN

## 2022-01-19 ENCOUNTER — Encounter: Payer: Self-pay | Admitting: Family Medicine

## 2022-01-19 ENCOUNTER — Other Ambulatory Visit: Payer: Self-pay | Admitting: *Deleted

## 2022-01-19 ENCOUNTER — Other Ambulatory Visit: Payer: Self-pay

## 2022-01-19 DIAGNOSIS — E785 Hyperlipidemia, unspecified: Secondary | ICD-10-CM

## 2022-01-19 MED ORDER — ATORVASTATIN CALCIUM 20 MG PO TABS: 20.0000 mg | ORAL_TABLET | Freq: Every day | ORAL | 1 refills | Status: DC

## 2022-01-19 MED ORDER — BUPROPION HCL ER (XL) 150 MG PO TB24: 150.0000 mg | ORAL_TABLET | Freq: Every day | ORAL | 1 refills | Status: DC

## 2022-01-19 MED ORDER — POTASSIUM CHLORIDE CRYS ER 20 MEQ PO TBCR: 20.0000 meq | EXTENDED_RELEASE_TABLET | Freq: Every day | ORAL | 1 refills | Status: DC

## 2022-01-19 MED ORDER — OMEPRAZOLE 40 MG PO CPDR: 40.0000 mg | DELAYED_RELEASE_CAPSULE | Freq: Every day | ORAL | 1 refills | Status: DC

## 2022-01-19 MED ORDER — SERTRALINE HCL 50 MG PO TABS: 50.0000 mg | ORAL_TABLET | Freq: Every day | ORAL | 3 refills | Status: DC

## 2022-01-19 MED ORDER — BACLOFEN 10 MG PO TABS
10.0000 mg | ORAL_TABLET | Freq: Three times a day (TID) | ORAL | 1 refills | Status: DC | PRN
Start: 2022-01-19 — End: 2022-09-15

## 2022-01-26 ENCOUNTER — Other Ambulatory Visit: Payer: Self-pay | Admitting: Family

## 2022-01-26 DIAGNOSIS — I10 Essential (primary) hypertension: Secondary | ICD-10-CM

## 2022-01-26 MED ORDER — LOSARTAN POTASSIUM-HCTZ 100-25 MG PO TABS: ORAL_TABLET | ORAL | 3 refills | Status: DC

## 2022-01-26 MED ORDER — LEVOTHYROXINE SODIUM 88 MCG PO TABS
88.0000 ug | ORAL_TABLET | Freq: Every day | ORAL | 3 refills | Status: DC
Start: 2022-01-26 — End: 2022-10-07

## 2022-01-30 IMAGING — MR MR HEAD WO/W CM
8 of 13 series · 17 of 48 positions shown · IV contrast (gadavist)
Comparison: MRI of the brain October 01, 2020.

CLINICAL DATA: Meningioma (HCC) 3DX.M ([id number]). Benign neoplasm,
brain/CNS; [REDACTED] protocol. Follow-up of treated Meningioma. Pt also
has hx of MS.

EXAM:
MRI HEAD WITHOUT AND WITH CONTRAST
TECHNIQUE: Multiplanar, multiecho pulse sequences of the brain and surrounding
structures were obtained without and with intravenous contrast.
CONTRAST:  10mL GADAVIST GADOBUTROL 1 MMOL/ML IV SOLN

[Series 6: FLAIR · axial · 3.0mm · 0.47mm/px · z∈[-81,+78]mm · 2 of 54 slices shown (1 of 2)]
[im 1/54]
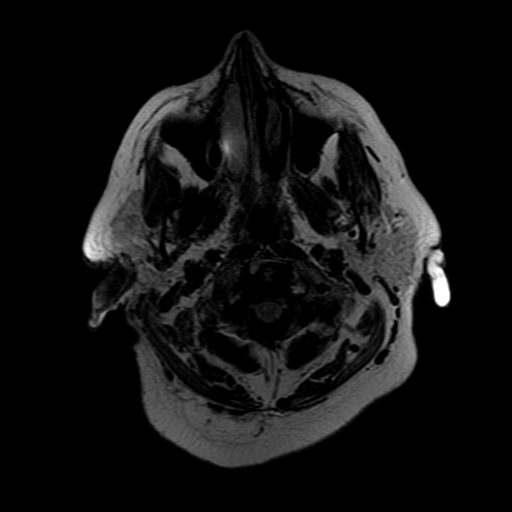
[im 54/54]
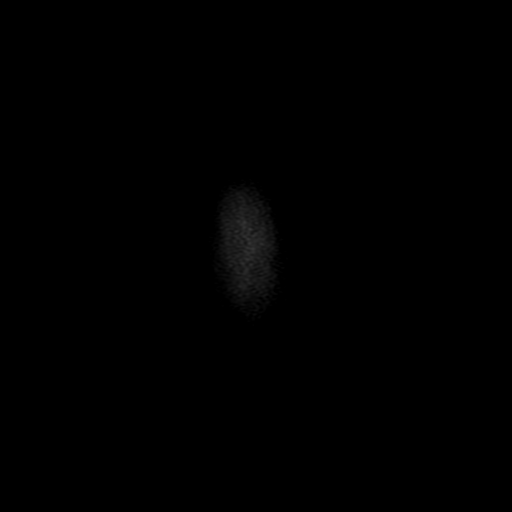

[Series 7: FLAIR · sagittal · 5.0mm · 0.23mm/px · 1 of 31 slices shown (2 of 2)]
[im 1/31]
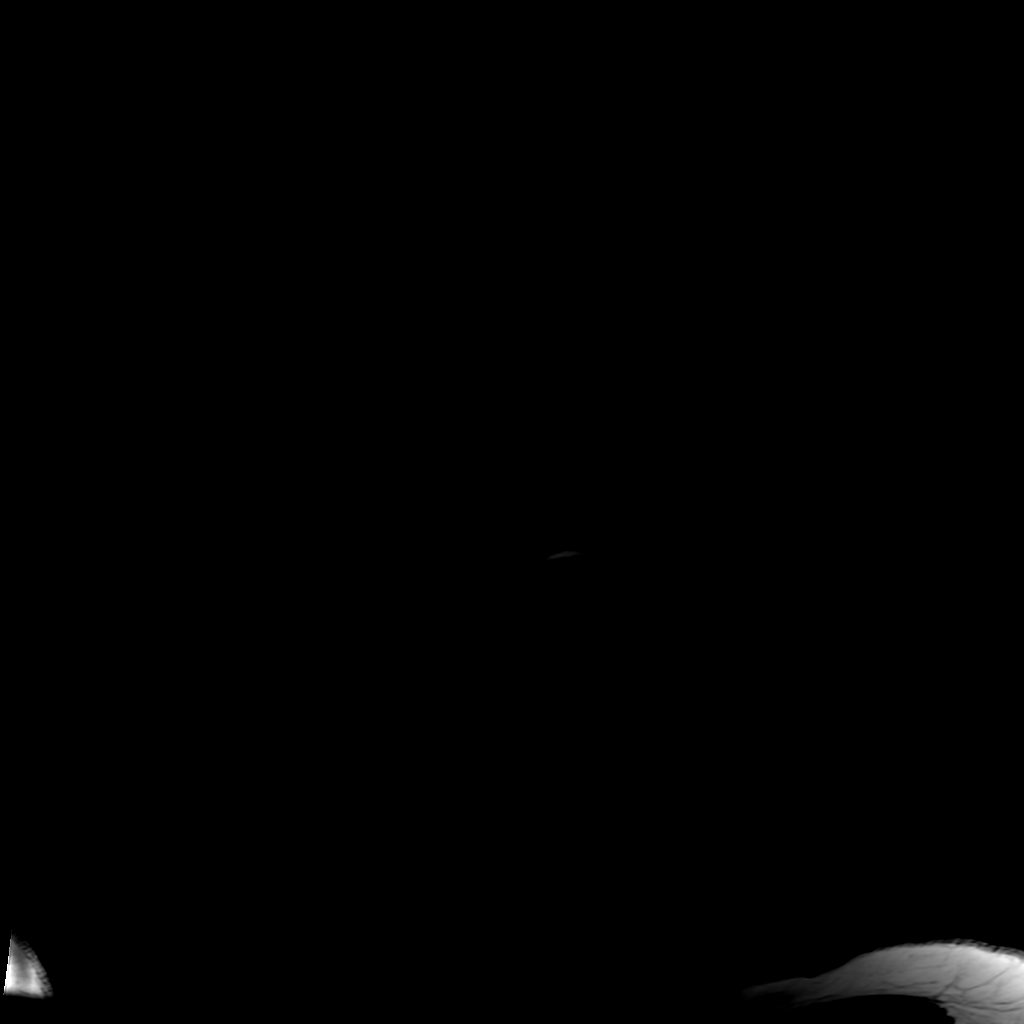

[Series 9: T2 post-contrast · coronal · 3.0mm · 0.39mm/px · 1 of 45 slices shown (1 of 2)]
[im 1/45]
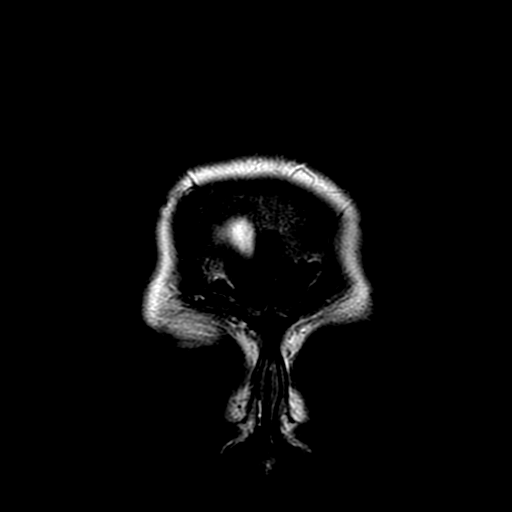

[Series 10: T2 post-contrast · axial · 5.0mm · 0.47mm/px · 1 of 26 slices shown (2 of 2)]
[im 1/26]
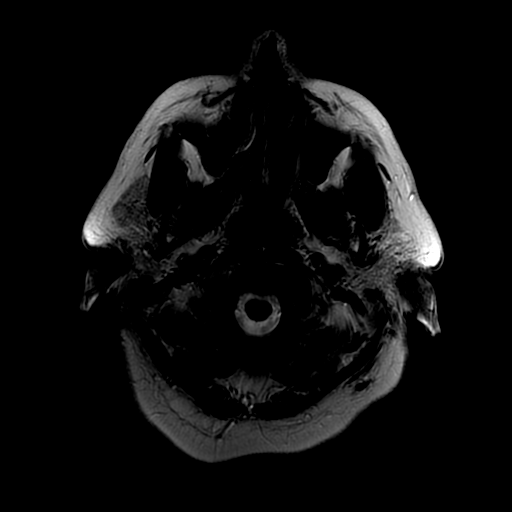

[Series 11: T1 post-contrast · coronal · 3.0mm · 0.43mm/px · 1 of 45 slices shown (1 of 2)]
[im 1/45]
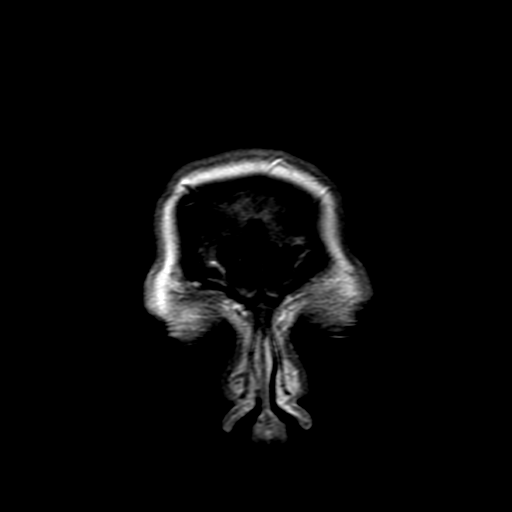

[Series 12: FLAIR post-contrast · sagittal · 5.0mm · 0.47mm/px · 1 of 31 slices shown]
[im 1/31]
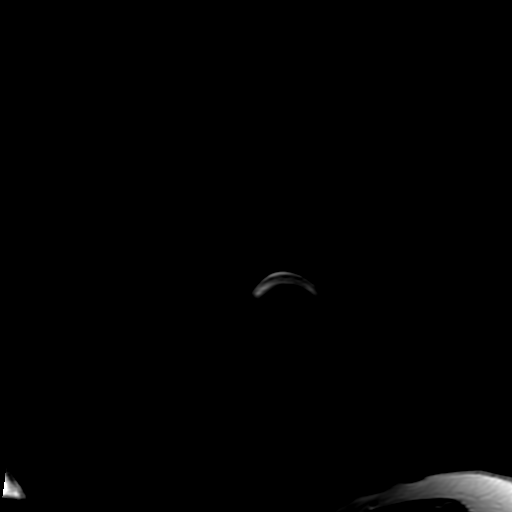

[Series 250: ADC · axial · 3.0mm · 0.94mm/px · z∈[-110,+41]mm · 2 of 52 slices shown]
[im 1/52]
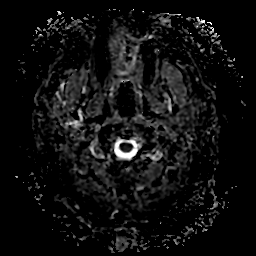
[im 52/52]
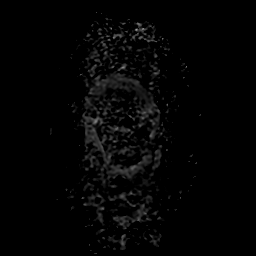

[Series 1300: T1 post-contrast · axial · 0.9mm · 0.50mm/px · z∈[-165,+90]mm · 8 of 301 slices shown (2 of 2)]
[im 1/301]
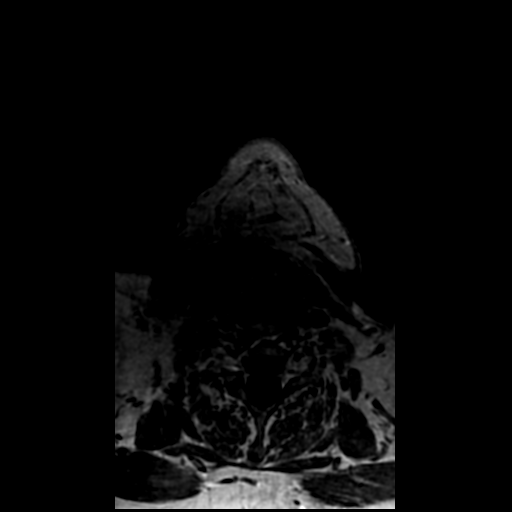
[im 38/301]
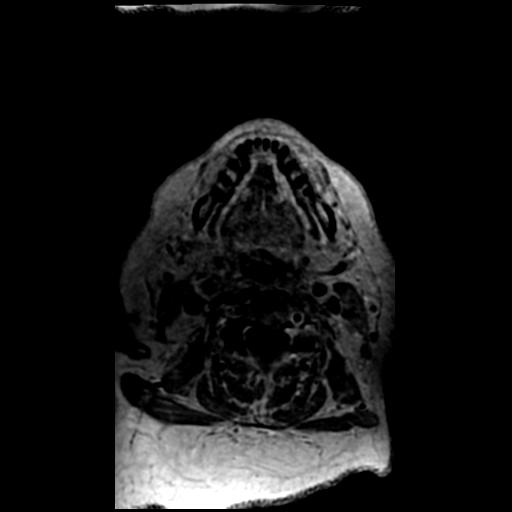
[im 76/301]
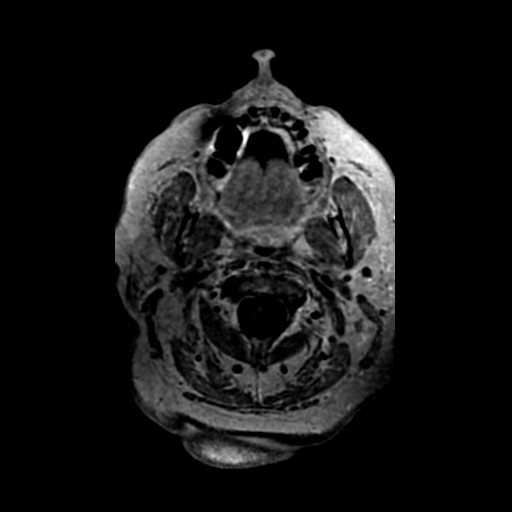
[im 113/301]
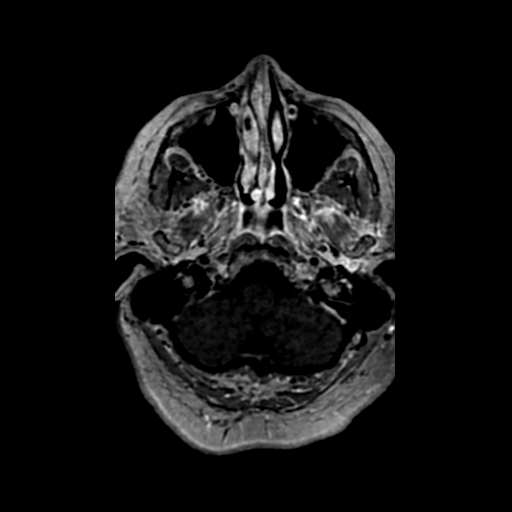
[im 188/301]
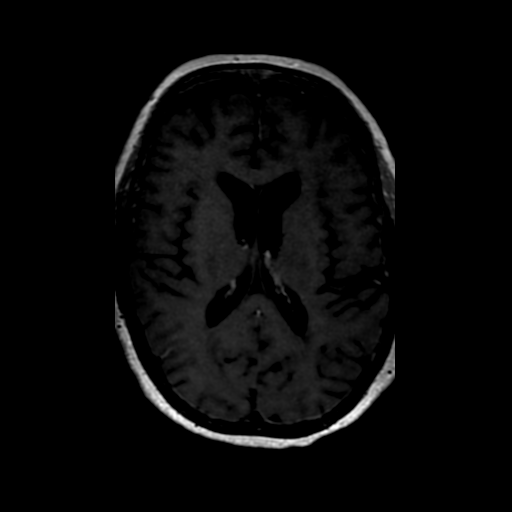
[im 226/301]
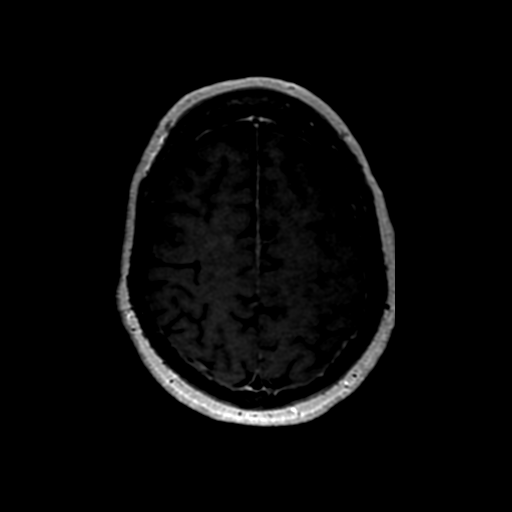
[im 263/301]
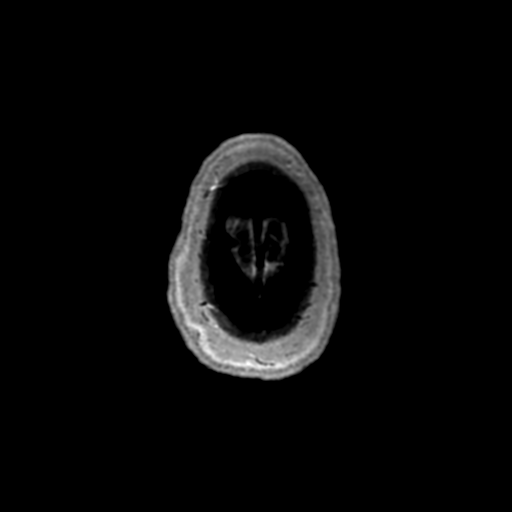
[im 301/301]
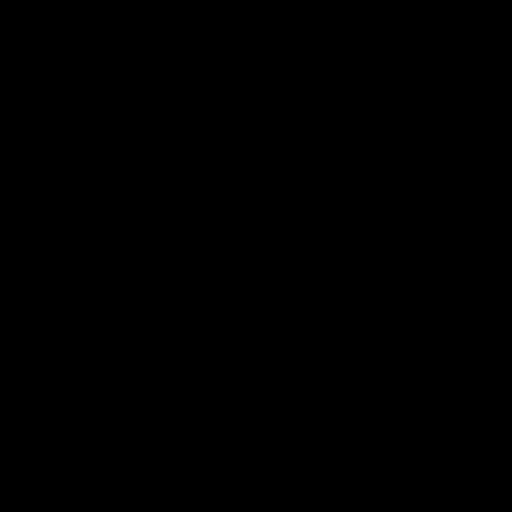

[17 of 48 positions shown; findings below may reference images not displayed]

FINDINGS: Brain: No acute infarction, hemorrhage, hydrocephalus, or
extra-axial collection.

No significant change of the left sphenoid wing meningioma measuring
16 x 15 x 14 mm (15 x 15 x 14 on prior). There is also no
significant change of the left and [REDACTED] meningioma measuring
approximately 9 x 7 mm (9 x 7 mm on prior). There is no significant
mass effect on the brain parenchyma.

Scattered foci of T2 hyperintensity are seen within the white matter
of the cerebral hemispheres and in the left cerebellar hemisphere,
consistent with the clinical diagnosis of multiple sclerosis.

New T2 hyperintense lesions with contrast enhancement are seen
within the left side of the pons and mesial left temporal lobe.

10 mm pineal cyst is unchanged.

Vascular: Normal flow voids.

Skull and upper cervical spine: Diffuse decrease of the T1 signal
within the calvarium, unchanged.

Sinuses/Orbits: Negative.

Other: None.
IMPRESSION: 1. Stable appearance of 16 mm left sphenoid wing and 9 mm left
tentorial meningiomas.
2. New T2 hyperintense lesions with contrast enhancement on the left
side of the pons and in the medial left temporal may represent
posttreatment changes. However, possibility of active demyelination
plaques cannot be excluded. Continued follow-up suggested.

## 2022-02-03 ENCOUNTER — Ambulatory Visit (HOSPITAL_BASED_OUTPATIENT_CLINIC_OR_DEPARTMENT_OTHER): Payer: Commercial Managed Care - PPO | Admitting: Family

## 2022-02-03 ENCOUNTER — Encounter (HOSPITAL_BASED_OUTPATIENT_CLINIC_OR_DEPARTMENT_OTHER): Payer: Self-pay

## 2022-03-07 ENCOUNTER — Encounter (INDEPENDENT_AMBULATORY_CARE_PROVIDER_SITE_OTHER): Payer: Medicare Other | Admitting: Family Medicine

## 2022-03-07 DIAGNOSIS — R112 Nausea with vomiting, unspecified: Secondary | ICD-10-CM

## 2022-03-07 MED ORDER — ONDANSETRON HCL 4 MG PO TABS: 4.0000 mg | ORAL_TABLET | Freq: Three times a day (TID) | ORAL | 0 refills | Status: DC | PRN

## 2022-03-07 NOTE — Telephone Encounter (Signed)
[facility] VISIT   Patient agreed to [facility] visit and is aware that copayment and coinsurance may apply. Patient was treated using telemedicine according to accepted telemedicine protocols.  Subjective:   Patient complains of GI bug.  Asking for refill on Zofran.  Patient Active Problem List   Diagnosis Date Noted   Elevated CEA 09/28/2021   Cerebral meningioma ([place]) 11/27/2020   Genetic testing 09/11/2020   Obesity (BMI 30-39.9) 07/14/2020   Meningioma ([place]) 06/16/2020   Arthritis 11/04/2019   Insomnia 08/22/2019   Lumbar radiculopathy 03/12/2019   Spondylolisthesis at L4-L5 level 08/24/2018   Facet syndrome, lumbar 08/24/2018   Chronic SI joint pain 03/30/2018   Delayed sleep phase syndrome 03/30/2018   History of optic neuritis 07/19/2016   Hyperlipidemia 06/18/2015   H/O malignant neoplasm of breast 02/18/2015   Gonalgia 02/18/2015   Relapsing remitting multiple sclerosis ([place]) 02/18/2015   Well adult exam 02/02/2015   Physical exam 12/17/2014   Other fatigue 10/15/2014   Obstructive sleep apnea 10/15/2014   Restless leg syndrome 10/15/2014   Allergic rhinitis 09/30/2014   Pain in joint, shoulder region 10/14/2013   Memory loss 10/14/2013   Depression, recurrent ([place]) 08/30/2013   S/P total knee arthroplasty 08/23/2013   Hypothyroidism 05/29/2013   Esophageal reflux 05/29/2013   Hx of radiation therapy    Essential hypertension, benign 06/11/2012   Multiple sclerosis ([place]) 01/23/2012   Breast cancer of upper-outer quadrant of right female breast ([place]) 11/22/2011   Osteoarthritis of both knees 08/15/2011   Social History   Tobacco Use   Smoking status: Never   Smokeless tobacco: Never  Substance Use Topics   Alcohol use: No    Current Outpatient Medications:    Ascorbic Acid (VITAMIN C PO), Take by mouth., Disp: , Rfl:    aspirin EC 81 MG tablet, Take 1 tablet (81 mg total) by mouth daily. Swallow whole., Disp: 90 tablet, Rfl: 3   atorvastatin (LIPITOR) 20 MG  tablet, Take 1 tablet (20 mg total) by mouth daily., Disp: 90 tablet, Rfl: 1   baclofen (LIORESAL) 10 MG tablet, Take 1 tablet (10 mg total) by mouth 3 (three) times daily as needed for muscle spasms., Disp: 270 tablet, Rfl: 1   buPROPion (WELLBUTRIN XL) 150 MG 24 hr tablet, Take 1 tablet (150 mg total) by mouth daily., Disp: 90 tablet, Rfl: 1   clonazePAM (KLONOPIN) 1 MG tablet, TAKE 1 TABLET(1 MG) BY MOUTH AT BEDTIME, Disp: 90 tablet, Rfl: 0   EPINEPHrine 0.3 mg/0.3 mL IJ SOAJ injection, Inject 0.3 mg into the muscle as needed for anaphylaxis., Disp: 1 each, Rfl: 3   gabapentin (NEURONTIN) 600 MG tablet, Take 1 tablet (600 mg total) by mouth 3 (three) times daily., Disp: 270 tablet, Rfl: 1   HYDROcodone-acetaminophen (NORCO/VICODIN) 5-325 MG tablet, , Disp: , Rfl:    levothyroxine (SYNTHROID) 88 MCG tablet, Take 1 tablet (88 mcg total) by mouth daily before breakfast., Disp: 90 tablet, Rfl: 3   losartan-hydrochlorothiazide (HYZAAR) 100-25 MG tablet, TAKE 1 TABLET EVERY MORNING BEFORE BREAKFAST, Disp: 90 tablet, Rfl: 3   omeprazole (PRILOSEC) 40 MG capsule, Take 1 capsule (40 mg total) by mouth daily., Disp: 90 capsule, Rfl: 1   ondansetron (ZOFRAN) 4 MG tablet, Take 1 tablet (4 mg total) by mouth every 8 (eight) hours as needed for nausea or vomiting., Disp: 20 tablet, Rfl: 0   potassium chloride SA (KLOR-CON M20) 20 MEQ tablet, Take 1 tablet (20 mEq total) by mouth daily., Disp: 90 tablet, Rfl:  1   sertraline (ZOLOFT) 50 MG tablet, Take 1 tablet (50 mg total) by mouth daily., Disp: 90 tablet, Rfl: 3  Allergies  Allergen Reactions   Bee Venom Anaphylaxis   Fentanyl Other (See Comments)    Very low BP, decrease LOC-received Narcan-in 2020    Assessment and Plan:   Diagnosis: nausea/vomiting. Please see myChart communication and orders below.   No orders of the defined types were placed in this encounter.  Meds ordered this encounter  Medications   ondansetron (ZOFRAN) 4 MG tablet     Sig: Take 1 tablet (4 mg total) by mouth every 8 (eight) hours as needed for nausea or vomiting.    Dispense:  20 tablet    Refill:  0    Annye Asa, MD 03/07/2022  A total of 5 minutes were spent by me to personally review the patient-generated inquiry, review patient records and data pertinent to assessment of the patient's problem, develop a management plan including generation of prescriptions and/or orders, and on subsequent communication with the patient through secure the MyChart portal service.   There is no separately reported E/M service related to this service in the past 7 days nor does the patient have an upcoming soonest available appointment for this issue. This work was completed in less than 7 days.   The patient consented to this service today (see patient agreement prior to ongoing communication). Patient counseled regarding the need for in-person exam for certain conditions and was advised to call the office if any changing or worsening symptoms occur.   The codes to be used for the E/M service are: '[x]'$   99421 for 5-10 minutes of time spent on the inquiry. '[]'$   [id number] for 11-20 minutes. '[]'$   99423 for 21+ minutes.

## 2022-03-14 ENCOUNTER — Other Ambulatory Visit: Payer: Self-pay | Admitting: Neurology

## 2022-03-16 ENCOUNTER — Other Ambulatory Visit: Payer: Self-pay | Admitting: Family Medicine

## 2022-03-16 DIAGNOSIS — E785 Hyperlipidemia, unspecified: Secondary | ICD-10-CM

## 2022-03-18 ENCOUNTER — Ambulatory Visit
Admission: RE | Admit: 2022-03-18 | Discharge: 2022-03-18 | Disposition: A | Discharge status: Home / Self Care | Payer: Medicare Other | Source: Ambulatory Visit | Attending: Internal Medicine | Admitting: Internal Medicine

## 2022-03-18 DIAGNOSIS — D320 Benign neoplasm of cerebral meninges: Secondary | ICD-10-CM | POA: Diagnosis not present

## 2022-03-18 DIAGNOSIS — G35 Multiple sclerosis: Secondary | ICD-10-CM | POA: Diagnosis not present

## 2022-03-18 DIAGNOSIS — Z853 Personal history of malignant neoplasm of breast: Secondary | ICD-10-CM | POA: Diagnosis not present

## 2022-03-18 DIAGNOSIS — D329 Benign neoplasm of meninges, unspecified: Secondary | ICD-10-CM

## 2022-03-18 MED ORDER — GADOBENATE DIMEGLUMINE 529 MG/ML IV SOLN
18.0000 mL | Freq: Once | INTRAVENOUS | Status: AC | PRN
  Administered 2022-03-18: 18 mL via INTRAVENOUS

## 2022-03-21 ENCOUNTER — Inpatient Hospital Stay: Payer: Medicare Other | Attending: Internal Medicine

## 2022-03-21 DIAGNOSIS — C50411 Malignant neoplasm of upper-outer quadrant of right female breast: Secondary | ICD-10-CM | POA: Insufficient documentation

## 2022-03-21 DIAGNOSIS — Z801 Family history of malignant neoplasm of trachea, bronchus and lung: Secondary | ICD-10-CM | POA: Insufficient documentation

## 2022-03-21 DIAGNOSIS — Z79811 Long term (current) use of aromatase inhibitors: Secondary | ICD-10-CM | POA: Insufficient documentation

## 2022-03-21 DIAGNOSIS — Z9221 Personal history of antineoplastic chemotherapy: Secondary | ICD-10-CM | POA: Insufficient documentation

## 2022-03-21 DIAGNOSIS — C700 Malignant neoplasm of cerebral meninges: Secondary | ICD-10-CM | POA: Insufficient documentation

## 2022-03-21 DIAGNOSIS — Z9012 Acquired absence of left breast and nipple: Secondary | ICD-10-CM | POA: Insufficient documentation

## 2022-03-21 DIAGNOSIS — Z8041 Family history of malignant neoplasm of ovary: Secondary | ICD-10-CM | POA: Insufficient documentation

## 2022-03-21 DIAGNOSIS — Z923 Personal history of irradiation: Secondary | ICD-10-CM | POA: Insufficient documentation

## 2022-03-21 DIAGNOSIS — Z170 Estrogen receptor positive status [ER+]: Secondary | ICD-10-CM | POA: Insufficient documentation

## 2022-03-22 ENCOUNTER — Inpatient Hospital Stay (HOSPITAL_BASED_OUTPATIENT_CLINIC_OR_DEPARTMENT_OTHER): Payer: Medicare Other | Admitting: Internal Medicine

## 2022-03-22 ENCOUNTER — Encounter: Payer: Self-pay | Admitting: Internal Medicine

## 2022-03-22 VITALS — BP 151/62 | HR 51 | Temp 98.3°F | Resp 16 | Ht 66.0 in | Wt 199.1 lb

## 2022-03-22 DIAGNOSIS — Z8041 Family history of malignant neoplasm of ovary: Secondary | ICD-10-CM | POA: Diagnosis not present

## 2022-03-22 DIAGNOSIS — Z170 Estrogen receptor positive status [ER+]: Secondary | ICD-10-CM | POA: Diagnosis not present

## 2022-03-22 DIAGNOSIS — D329 Benign neoplasm of meninges, unspecified: Secondary | ICD-10-CM | POA: Diagnosis not present

## 2022-03-22 DIAGNOSIS — Z9012 Acquired absence of left breast and nipple: Secondary | ICD-10-CM | POA: Diagnosis not present

## 2022-03-22 DIAGNOSIS — Z79811 Long term (current) use of aromatase inhibitors: Secondary | ICD-10-CM | POA: Diagnosis not present

## 2022-03-22 DIAGNOSIS — Z923 Personal history of irradiation: Secondary | ICD-10-CM | POA: Diagnosis not present

## 2022-03-22 DIAGNOSIS — C700 Malignant neoplasm of cerebral meninges: Secondary | ICD-10-CM | POA: Diagnosis not present

## 2022-03-22 DIAGNOSIS — Z801 Family history of malignant neoplasm of trachea, bronchus and lung: Secondary | ICD-10-CM | POA: Diagnosis not present

## 2022-03-22 DIAGNOSIS — C50411 Malignant neoplasm of upper-outer quadrant of right female breast: Secondary | ICD-10-CM | POA: Diagnosis not present

## 2022-03-22 DIAGNOSIS — Z9221 Personal history of antineoplastic chemotherapy: Secondary | ICD-10-CM | POA: Diagnosis not present

## 2022-03-22 NOTE — Progress Notes (Signed)
[place] at [facility], [place] [address] [phone]   Interval Evaluation  Date of Service: 03/22/22 Patient Name: Julie Ross Patient MRN: [id number] Patient DOB: 06-10-1961 Provider: Ventura Sellers, MD  Identifying Statement:  Julie Ross is a 61 y.o. female with  multifocal  meningioma    Oncologic History: Oncology History  Breast cancer of upper-outer quadrant of right female breast ([place])  11/22/2011 Initial Diagnosis   Cancer of upper-outer quadrant of female breast   12/19/2011 Surgery   Left mastectomy with SLN biopsy 2 foci of invasive ductal carcinoma 0.8 and 0.6 cm low-grade ER PR positive HER-2 positive ratio 3.27 Ki-67 11% one lymph node positive out of 4 (T1, N1, M0 stage II)   02/16/2012 - 04/14/2013 Chemotherapy   Taxotere, carboplatin, Herceptin x2 cycles complicated by bacteremia and left knee sepsis requiring revision of the knee. Herceptin maintenance every 3 weeks. Herceptin maintenance completed 04/14/2013   04/17/2012 - 06/04/2012 Radiation Therapy   Radiation therapy to the breast   02/14/2013 Genetic Testing   Negative genetic testing on the Breast/Ovarian Cancer panel.  The Breast/Ovarian gene panel offered by [facility] includes sequencing and rearrangement analysis for the following 21 genes:  ATM, BARD1, BRCA1, BRCA2, BRIP1, CDH1, CHEK2, EPCAM, FANCC, MLH1, MSH2, MSH6, NBN, PALB2, PMS2, PTEN, STK11, RAD51C, RAD51D, TP53, and XRCC2.   The report date is February 14, 2013.   03/13/2013 -  Anti-estrogen oral therapy   Letrozole 2.5 mg once daily     CNS Oncologic History 10/15/20: SRS to two meningomas, L sphenoid wing and L tentorium Julie Ross)  Interval History: Julie Ross presents today for follow up.  She denies any new or progressive neurologic deficits.  No seizures or headaches.  (H+P) Patient presents today for follow up after completing post-SRS MRI study.  She describes no new or  progressive neurologic deficits.  No facial weakness, numbness, or double vision.  Continues to follow with Dr. Felecia Shelling for history of MS.  Functional status is independent, doesn't require assistance with ADLs.  Medications: Current Outpatient Medications on File Prior to Visit  Medication Sig Dispense Refill   Ascorbic Acid (VITAMIN C PO) Take by mouth.     aspirin EC 81 MG tablet Take 1 tablet (81 mg total) by mouth daily. Swallow whole. 90 tablet 3   atorvastatin (LIPITOR) 20 MG tablet TAKE 1 TABLET BY MOUTH DAILY 100 tablet 2   baclofen (LIORESAL) 10 MG tablet Take 1 tablet (10 mg total) by mouth 3 (three) times daily as needed for muscle spasms. 270 tablet 1   buPROPion (WELLBUTRIN XL) 150 MG 24 hr tablet Take 1 tablet (150 mg total) by mouth daily. 90 tablet 1   clonazePAM (KLONOPIN) 1 MG tablet TAKE 1 TABLET(1 MG) BY MOUTH AT BEDTIME 90 tablet 0   EPINEPHrine 0.3 mg/0.3 mL IJ SOAJ injection Inject 0.3 mg into the muscle as needed for anaphylaxis. 1 each 3   gabapentin (NEURONTIN) 600 MG tablet Take 1 tablet (600 mg total) by mouth 3 (three) times daily. 270 tablet 1   HYDROcodone-acetaminophen (NORCO/VICODIN) 5-325 MG tablet      levothyroxine (SYNTHROID) 88 MCG tablet Take 1 tablet (88 mcg total) by mouth daily before breakfast. 90 tablet 3   losartan-hydrochlorothiazide (HYZAAR) 100-25 MG tablet TAKE 1 TABLET EVERY MORNING BEFORE BREAKFAST 90 tablet 3   omeprazole (PRILOSEC) 40 MG capsule TAKE 1 CAPSULE BY MOUTH DAILY 100 capsule 2   ondansetron (  ZOFRAN) 4 MG tablet Take 1 tablet (4 mg total) by mouth every 8 (eight) hours as needed for nausea or vomiting. 20 tablet 0   potassium chloride SA (KLOR-CON M) 20 MEQ tablet TAKE 1 TABLET BY MOUTH DAILY 100 tablet 2   sertraline (ZOLOFT) 50 MG tablet Take 1 tablet (50 mg total) by mouth daily. 90 tablet 3   No current facility-administered medications on file prior to visit.    Allergies:  Allergies  Allergen Reactions   Bee Venom  Anaphylaxis   Fentanyl Other (See Comments)    Very low BP, decrease LOC-received Narcan-in 2020   Past Medical History:  Past Medical History:  Diagnosis Date   Anemia    h/o - ? bld. transfusion - more recent- ?2013   Anxiety    uses xanax mainly for sleep   Arthritis    knees    Basal cell carcinoma 01/11/2005   rim left nostril-,mohs   BCC (basal cell carcinoma) 01/11/2005   right cheek mid-mohs   BCC (basal cell carcinoma) 09/12/2006   right forehead -mohs   BCC (basal cell carcinoma) 01/19/2016   right upper arm -[id number]   BCC (basal cell carcinoma) 02/18/2019   forehead-mohs   [place]ast cancer ([place] 11/2011   lul/ER+PR+, x1 lymph node    Cancer (HCC)    squamous cell skin cancer x7   Complication of anesthesia    Depression    GERD (gastroesophageal reflux disease)    H/O bone density study 03/2011   H/O colonoscopy    H/O echocardiogram    last one 02/2013, due to chemotherapy   Heart burn    Hx of radiation therapy 04/17/12- 06/04/12   left chest wall, axilla, L supraclavicular fossa 4500 cGy, left mastectomy scar/chest wall 5940 cGy   Hypertension    Hypothyroidism    IBS (irritable bowel syndrome)    [place] 08[place]   Multiple sclerosis (HCC)    Neuromuscular disorder ([place]    MS TX WITH CYMBALTA    Personal history of chemotherapy    Personal history of radiation therapy    SCC (squamous cell carcinoma) 01/11/2005   chest mid   Sleep apnea    no CPAP   Squamous cell carcinoma of skin 01/11/2005   over left lip   Wears glasses    Past Surgical History:  Past Surgical History:  Procedure Laterality Date   APPLICATION OF ROBOTIC ASSISTANCE FOR SPINAL PROCEDURE N/A 03/12/2019   Procedure: APPLICATION OF ROBOTIC ASSISTANCE FOR SPINAL PROCEDURE;  Surgeon: OJudith Part MD;  [facility]  Service: Neurosurgery;  Laterality: N/A;   BREAST REDUCTION SURGERY Right 01/25/2021   Procedure: MAMMARY REDUCTION  (BREAST);  Surgeon: TIrene Limbo MD;  [facility]  Service: Plastics;  Laterality: Right;   CESAREAN SECTION     X2    COLONOSCOPY     ESOPHAGOGASTRODUODENOSCOPY     FLEXIBLE SIGMOIDOSCOPY     I & D KNEE WITH POLY EXCHANGE  03/02/2012   Procedure: IRRIGATION AND DEBRIDEMENT KNEE WITH POLY EXCHANGE;  Surgeon: JAlta Corning MD;  [facility]  Service: Orthopedics;  Laterality: Left;  I&D of total knee with Possible Poly Exchange    JOINT REPLACEMENT  07/22/2011   left knee- multiple surgeries    KNEE ARTHROSCOPY     LT KNEE 04/2011   KNEE ARTHROSCOPY  06/20/1982   rt   MASTECTOMY     MASTECTOMY W/ SENTINEL NODE BIOPSY  12/19/2011  Procedure: MASTECTOMY WITH SENTINEL LYMPH NODE BIOPSY;  Surgeon: Haywood Lasso, MD;  Location: [place];  Service: General;  Laterality: Left;  left breast and left axilla    MOHS SURGERY     right face   PORT-A-CATH REMOVAL  03/06/2012   Procedure: REMOVAL PORT-A-CATH;  Surgeon: Odis Hollingshead, MD;  Location: [place];  Service: General;  Laterality: N/A;   PORT-A-CATH REMOVAL Right 08/22/2012   Procedure: REMOVAL PORT-A-CATH;  Surgeon: Edward Jolly, MD;  Location: [place];  Service: General;  Laterality: Right;   PORTACATH PLACEMENT  01/16/2012   Procedure: INSERTION PORT-A-CATH;  Surgeon: Haywood Lasso, MD;  Location: [place];  Service: General;  Laterality: Right;  Porta Cath Placement    PORTACATH PLACEMENT  05/01/2012   Procedure: INSERTION PORT-A-CATH;  Surgeon: Haywood Lasso, MD;  Location: [place];  Service: General;  Laterality: Right;  right internal jugular port-a-cath insertion   TEE WITHOUT CARDIOVERSION  03/06/2012   Procedure: TRANSESOPHAGEAL ECHOCARDIOGRAM (TEE);  Surgeon: Josue Hector, MD;  Location: [place];  Service: Cardiovascular;  Laterality: N/A;  Rm. [id number]   TONSILLECTOMY     TOTAL KNEE ARTHROPLASTY  08/15/2011   Procedure: TOTAL KNEE ARTHROPLASTY; lft Surgeon: Alta Corning, MD;  Location: [place];  Service: Orthopedics;  Laterality: Left;  RIGHT KNEE CORTIZONE INJECTION   TOTAL KNEE ARTHROPLASTY Left 08/18/2012   Procedure:  Irrigation and debridement of LEFT total knee;  Removal of total knee parts; Implant of spacers;  Surgeon: Kerin Salen, MD;  Location: [place];  Service: Orthopedics;  Laterality: Left;   TOTAL KNEE ARTHROPLASTY Right 08/12/2013   TOTAL KNEE ARTHROPLASTY Right 08/12/2013   Procedure: RIGHT TOTAL KNEE ARTHROPLASTY;  Surgeon: Kerin Salen, MD;  Location: [place];  Service: Orthopedics;  Laterality: Right;   TOTAL KNEE REVISION Left 04/19/2013   Procedure: TOTAL KNEE REVISION AND REMOVAL CEMENT SPACER;  Surgeon: Kerin Salen, MD;  Location: [place];  Service: Orthopedics;  Laterality: Left;   TRANSFORAMINAL LUMBAR INTERBODY FUSION (TLIF) WITH PEDICLE SCREW FIXATION 1 LEVEL Bilateral 03/12/2019   Procedure: Lumbar four-five Open transforaminal lumbar interbody fusion with Lumbar four-five laminectomy, bilateral Lumbar four-five Pedicle screw placement;  Surgeon: Judith Part, MD;  Location: [place];  Service: Neurosurgery;  Laterality: Bilateral;   TUMOR REMOVAL     FROM [place] AGE 61   Social History:  Social History   Socioeconomic History   Marital status: Married    Spouse name: Not on file   Number of children: 2   Years of education: Not on file   Highest education level: Not on file  Occupational History    Employer: [employer]    Occupation: disabled  Tobacco Use   Smoking status: Never   Smokeless tobacco: Never  Vaping Use   Vaping Use: Never used  Substance and Sexual Activity   Alcohol use: No   Drug use: No   Sexual activity: Yes    Birth control/protection: Post-menopausal  Other Topics Concern   Not on file  Social History Narrative   Not on file   Social Determinants of Health   Financial Resource Strain: Not on file  Food Insecurity: Not on file  Transportation Needs: Not on file  Physical  Activity: Not on file  Stress: Not on file  Social Connections: Not on file  Intimate Partner Violence: Not on file   Family History:  Family History  Problem Relation Age of  Onset   Arthritis Mother    Hypertension Mother    Heart disease Mother        CHF   Dementia Father    Diabetes type II Father    Heart disease Maternal Grandfather    Breast cancer Paternal Grandmother        dx in her 50s   Lung cancer Maternal Uncle        smoker   Breast cancer Paternal Aunt        dx in her 6s   Ovarian cancer Paternal Aunt        dx in her 57s   Colon cancer Neg Hx    Stomach cancer Neg Hx    Esophageal cancer Neg Hx     Review of Systems: Constitutional: Doesn't report fevers, chills or abnormal weight loss Eyes: Doesn't report blurriness of vision Ears, nose, mouth, throat, and face: Doesn't report sore throat Respiratory: Doesn't report cough, dyspnea or wheezes Cardiovascular: Doesn't report palpitation, chest discomfort  Gastrointestinal:  Doesn't report nausea, constipation, diarrhea GU: Doesn't report incontinence Skin: Doesn't report skin rashes Neurological: Per HPI Musculoskeletal: Doesn't report joint pain Behavioral/Psych: Doesn't report anxiety  Physical Exam: There were no vitals filed for this visit.   KPS: 90. General: Alert, cooperative, pleasant, in no acute distress Head: Normal EENT: No conjunctival injection or scleral icterus.  Lungs: Resp effort normal Cardiac: Regular rate Abdomen: Non-distended abdomen Skin: No rashes cyanosis or petechiae. Extremities: No clubbing or edema  Neurologic Exam: Mental Status: Awake, alert, attentive to examiner. Oriented to self and environment. Language is fluent with intact comprehension.  Cranial Nerves: Visual acuity is grossly normal. Visual fields are full. Extra-ocular movements intact. No ptosis. Face is symmetric Motor: Tone and bulk are normal. Power is full in both arms and legs. Reflexes are  symmetric, no pathologic reflexes present.  Sensory: Intact to light touch Gait: Normal.   Labs: I have reviewed the data as listed    Component Value Date/Time   NA 135 10/22/2021 1047   NA 134 04/06/2021 0957   NA 138 08/18/2014 1551   K 3.8 10/22/2021 1047   K 3.7 08/18/2014 1551   CL 99 10/22/2021 1047   CL 100 11/30/2012 1029   CO2 27 10/22/2021 1047   CO2 27 08/18/2014 1551   GLUCOSE 101 (H) 10/22/2021 1047   GLUCOSE 117 08/18/2014 1551   GLUCOSE 91 11/30/2012 1029   BUN 17 10/22/2021 1047   BUN 17 04/06/2021 0957   BUN 17.2 08/18/2014 1551   CREATININE 1.03 10/22/2021 1047   CREATININE 1.36 (H) 09/30/2020 1412   CREATININE 1.29 (H) 06/26/2017 1603   CREATININE 1.3 (H) 08/18/2014 1551   CALCIUM 8.9 10/22/2021 1047   CALCIUM 9.1 08/18/2014 1551   PROT 6.8 10/06/2021 1449   PROT 7.3 05/31/2018 1545   PROT 7.1 08/18/2014 1551   ALBUMIN 4.3 10/06/2021 1449   ALBUMIN 4.6 05/31/2018 1545   ALBUMIN 4.0 08/18/2014 1551   AST 17 10/06/2021 1449   AST 19 08/18/2014 1551   ALT 13 10/06/2021 1449   ALT 21 08/18/2014 1551   ALKPHOS 134 (H) 10/06/2021 1449   ALKPHOS 137 08/18/2014 1551   BILITOT 0.8 10/06/2021 1449   BILITOT 0.6 05/31/2018 1545   BILITOT 0.46 08/18/2014 1551   GFRNONAA >60 01/20/2021 1532   GFRNONAA 44 (L) 09/30/2020 1412   GFRAA 57 (L) 10/09/2019 1458   Lab Results  Component Value Date   WBC 6.5 10/06/2021   NEUTROABS  4.0 10/06/2021   HGB 11.7 (L) 10/06/2021   HCT 35.3 (L) 10/06/2021   MCV 78.0 10/06/2021   PLT 214.0 10/06/2021    Imaging: [place] Clinician Interpretation: I have personally reviewed the CNS images as listed.  My interpretation, in the context of the patient's clinical presentation, is stable disease  MR BRAIN W WO CONTRAST  Result Date: 03/19/2022 CLINICAL DATA:  Follow-up cerebral meningioma. Asymptomatic. History of breast cancer in 2013 EXAM: MRI HEAD WITHOUT AND WITH CONTRAST TECHNIQUE: Multiplanar, multiecho pulse sequences  of the brain and surrounding structures were obtained without and with intravenous contrast. CONTRAST:  79m MULTIHANCE GADOBENATE DIMEGLUMINE 529 MG/ML IV SOLN COMPARISON:  03/19/2021 FINDINGS: Brain: Meningioma projecting inferior and medial from the left tentorium, contacting the left trigeminal nerve at the cisternal segment, 7 mm on axial images. Unchanged meningioma along the left greater wing of the sphenoid measuring 15-16 mm on axial images, with dural tail. No adjacent brain edema. Adjacent linear sulcal enhancement is regressed. Multiple sclerosis pattern of infratentorial and supratentorial signal abnormalities, primarily periventricular. No progression, restricted diffusion, or abnormal enhancement. No infarct, hydrocephalus, or collection. Vascular: Major flow voids and vascular enhancements are preserved Skull and upper cervical spine: Normal marrow signal. Sinuses/Orbits: Negative IMPRESSION: 1. Unchanged meningiomas along the left tentorium and left sphenoid wing, the latter measuring up to 16 mm. 2. Stable multiple sclerosis pattern. Electronically Signed   By: JJorje GuildM.D.   On: 03/19/2022 11:44     Assessment/Plan Meningioma ([place]  Julie Ross is clinically and radiographically stable today.  No new or progressive changes.  We ask that RLILIANI BOBOreturn to clinic in 12 months following next brain MRI, or sooner as needed.  All questions were answered. The patient knows to call the clinic with any problems, questions or concerns. No barriers to learning were detected.  The total time spent in the encounter was 30 minutes and more than 50% was on counseling and review of test results   ZVentura Sellers MD Medical Director of Neuro-Oncology [facility] WL[facility] [place]10/03/23 10:53 AM

## 2022-03-24 ENCOUNTER — Other Ambulatory Visit: Payer: Self-pay | Admitting: Radiation Therapy

## 2022-03-27 ENCOUNTER — Other Ambulatory Visit: Payer: Self-pay | Admitting: Neurology

## 2022-04-01 ENCOUNTER — Encounter (HOSPITAL_BASED_OUTPATIENT_CLINIC_OR_DEPARTMENT_OTHER): Payer: Self-pay | Admitting: Family

## 2022-04-01 ENCOUNTER — Ambulatory Visit (HOSPITAL_BASED_OUTPATIENT_CLINIC_OR_DEPARTMENT_OTHER): Payer: Medicare Other | Admitting: Family

## 2022-04-01 VITALS — BP 110/72 | HR 60 | Ht 66.0 in | Wt 198.0 lb

## 2022-04-01 DIAGNOSIS — G4733 Obstructive sleep apnea (adult) (pediatric): Secondary | ICD-10-CM

## 2022-04-01 DIAGNOSIS — I340 Nonrheumatic mitral (valve) insufficiency: Secondary | ICD-10-CM

## 2022-04-01 DIAGNOSIS — E785 Hyperlipidemia, unspecified: Secondary | ICD-10-CM

## 2022-04-01 DIAGNOSIS — I25118 Atherosclerotic heart disease of native coronary artery with other forms of angina pectoris: Secondary | ICD-10-CM | POA: Diagnosis not present

## 2022-04-01 DIAGNOSIS — I10 Essential (primary) hypertension: Secondary | ICD-10-CM

## 2022-04-01 NOTE — Patient Instructions (Signed)
Medication Instructions:  Continue your current medications.   *If you need a refill on your cardiac medications before your next appointment, please call your pharmacy*   Lab Work/Testing/Procedures: None ordered today.    Follow-Up: At [facility], you and your health needs are our priority.  As part of our continuing mission to provide you with exceptional heart care, we have created designated Provider Care Teams.  These Care Teams include your primary Cardiologist (physician) and Advanced Practice Providers (APPs -  Physician Assistants and Nurse Practitioners) who all work together to provide you with the care you need, when you need it.  We recommend signing up for the patient portal called "MyChart".  Sign up information is provided on this After Visit Summary.  MyChart is used to connect with patients for Virtual Visits (Telemedicine).  Patients are able to view lab/test results, encounter notes, upcoming appointments, etc.  Non-urgent messages can be sent to your provider as well.   To learn more about what you can do with MyChart, go to [email or web address].    Your next appointment:   6 month(s)  The format for your next appointment:   In Person  Provider:   Raliegh Ip Mali Hilty, MD or Laurann Montana, NP   Other Instructions Heart Healthy Diet Recommendations: A low-salt diet is recommended. Meats should be grilled, baked, or boiled. Avoid fried foods. Focus on lean protein sources like fish or chicken with vegetables and fruits. The American Heart Association is a Microbiologist!  American Heart Association Diet and Lifeystyle Recommendations   Exercise recommendations: The American Heart Association recommends 150 minutes of moderate intensity exercise weekly. Try 30 minutes of moderate intensity exercise 4-5 times per week. This could include walking, jogging, or swimming.  Tips to Measure your Blood Pressure Correctly  Here's what you can do to ensure a  correct reading:  Don't drink a caffeinated beverage or smoke during the 30 minutes before the test.  Sit quietly for five minutes before the test begins.  During the measurement, sit in a chair with your feet on the floor and your arm supported so your elbow is at about heart level.  The inflatable part of the cuff should completely cover at least 80% of your upper arm, and the cuff should be placed on bare skin, not over a shirt.  Don't talk during the measurement.   Blood pressure categories  Blood pressure category SYSTOLIC (upper number)  DIASTOLIC (lower number)  Normal Less than 120 mm Hg and Less than 80 mm Hg  Elevated 120-129 mm Hg and Less than 80 mm Hg  High blood pressure: Stage 1 hypertension 130-139 mm Hg or 80-89 mm Hg  High blood pressure: Stage 2 hypertension 140 mm Hg or higher or 90 mm Hg or higher  Hypertensive crisis (consult your doctor immediately) Higher than 180 mm Hg and/or Higher than 120 mm Hg  Source: American Heart Association and American Stroke Association. For more on getting your blood pressure under control, buy Controlling Your Blood Pressure, a Special Health Report from [facility].   Blood Pressure Log   Date   Time  Blood Pressure  Example: Nov 1 9 AM 124/78                                               Important Information About Sugar

## 2022-04-01 NOTE — Progress Notes (Signed)
Office Visit    Patient Name: Julie Ross Date of Encounter: 04/01/2022  PCP:  Midge Minium, MD   [place]  Cardiologist:  Pixie Casino, MD  Advanced Practice Provider:  No care team member to display Electrophysiologist:  None      Chief Complaint    Julie Ross is a 61 y.o. female with  presents today for follow-up of CAD  Past Medical History    Past Medical History:  Diagnosis Date   Anemia    h/o - ? bld. transfusion - more recent- ?2013   Anxiety    uses xanax mainly for sleep   Arthritis    knees    Basal cell carcinoma 01/11/2005   rim left nostril-,mohs   BCC (basal cell carcinoma) 01/11/2005   right cheek mid-mohs   BCC (basal cell carcinoma) 09/12/2006   right forehead -mohs   BCC (basal cell carcinoma) 01/19/2016   right upper arm -[id number]   BCC (basal cell carcinoma) 02/18/2019   forehead-mohs   [place]ast [place] ([facility] [place] 11/2011   lul/ER+PR+, x1 lymph node    [facility]    squamous cell skin cancer x7   Complication of anesthesia    Depression    GERD (gastroesophageal reflux disease)    H/O bone density study 03/2011   H/O colonoscopy    H/O echocardiogram    last one 02/2013, due to chemotherapy   Heart burn    Hx of radiation therapy 04/17/12- 06/04/12   left chest wall, axilla, L supraclavicular fossa 4500 cGy, left mastectomy scar/chest wall 5940 cGy   Hypertension    Hypothyroidism    IBS (irritable bowel syndrome)    [place] [place]   Multiple sclerosis ([facility]    Ne[place]romuscular disorder ([facility]    MS TX WITH CYMBALTA    Personal history of chemotherapy    Personal history of radiation therapy    SCC (squamous cell carcinoma) 01/11/2005   chest mid   Sleep apnea    no CPAP   Squamous cell carcinoma of skin 01/11/2005   over left lip   Wears glasses    Past Surgical History:  Procedure Laterality Date   APPLICATION OF ROBOTIC ASSISTANCE FOR SPINAL PROCEDURE N/A 03/12/2019   Procedure:  APPLICATION OF ROBOTIC ASSISTANCE FOR SPINAL PROCEDURE;  Surgeon: OJudith Part MD;  [place]  Service: Neurosurgery;  Laterality: N/A;   BREAST REDUCTION SURGERY Right 01/25/2021   Procedure: MAMMARY REDUCTION  (BREAST);  Surgeon: TIrene Limbo MD;  [place]  Service: Plastics;  Laterality: Right;   CESAREAN SECTION     X2    COLONOSCOPY     ESOPHAGOGASTRODUODENOSCOPY     FLEXIBLE SIGMOIDOSCOPY     I & D KNEE WITH POLY EXCHANGE  03/02/2012   Procedure: IRRIGATION AND DEBRIDEMENT KNEE WITH POLY EXCHANGE;  Surgeon: JAlta Corning MD;  [place]  Service: Orthopedics;  Laterality: Left;  I&D of total knee with Possible Poly Exchange    JOINT REPLACEMENT  07/22/2011   left knee- multiple surgeries    KNEE ARTHROSCOPY     LT KNEE 04/2011   KNEE ARTHROSCOPY  06/20/1982   rt   MASTECTOMY     MASTECTOMY W/ SENTINEL NODE BIOPSY  12/19/2011   Procedure: MASTECTOMY WITH SENTINEL LYMPH NODE BIOPSY;  Surgeon: CHaywood Lasso MD;  Location: [place]  Service: General;  Laterality: Left;  left breast and left axilla  MOHS SURGERY     right face   PORT-A-CATH REMOVAL  03/06/2012   Procedure: REMOVAL PORT-A-CATH;  Surgeon: Odis Hollingshead, MD;  Location: [place];  Service: General;  Laterality: N/A;   PORT-A-CATH REMOVAL Right 08/22/2012   Procedure: REMOVAL PORT-A-CATH;  Surgeon: Edward Jolly, MD;  Location: [place];  Service: General;  Laterality: Right;   PORTACATH PLACEMENT  01/16/2012   Procedure: INSERTION PORT-A-CATH;  Surgeon: Haywood Lasso, MD;  Location: [place];  Service: General;  Laterality: Right;  Porta Cath Placement    PORTACATH PLACEMENT  05/01/2012   Procedure: INSERTION PORT-A-CATH;  Surgeon: Haywood Lasso, MD;  Location: [place];  Service: General;  Laterality: Right;  right internal jugular port-a-cath insertion   TEE WITHOUT CARDIOVERSION  03/06/2012   Procedure:  TRANSESOPHAGEAL ECHOCARDIOGRAM (TEE);  Surgeon: Josue Hector, MD;  Location: [place];  Service: Cardiovascular;  Laterality: N/A;  Rm. [id number]   TONSILLECTOMY     TOTAL KNEE ARTHROPLASTY  08/15/2011   Procedure: TOTAL KNEE ARTHROPLASTY; lft Surgeon: Alta Corning, MD;  Location: [place];  Service: Orthopedics;  Laterality: Left;  RIGHT KNEE CORTIZONE INJECTION   TOTAL KNEE ARTHROPLASTY Left 08/18/2012   Procedure:  Irrigation and debridement of LEFT total knee;  Removal of total knee parts; Implant of spacers;  Surgeon: Kerin Salen, MD;  Location: [place];  Service: Orthopedics;  Laterality: Left;   TOTAL KNEE ARTHROPLASTY Right 08/12/2013   TOTAL KNEE ARTHROPLASTY Right 08/12/2013   Procedure: RIGHT TOTAL KNEE ARTHROPLASTY;  Surgeon: Kerin Salen, MD;  Location: [place];  Service: Orthopedics;  Laterality: Right;   TOTAL KNEE REVISION Left 04/19/2013   Procedure: TOTAL KNEE REVISION AND REMOVAL CEMENT SPACER;  Surgeon: Kerin Salen, MD;  Location: [place];  Service: Orthopedics;  Laterality: Left;   TRANSFORAMINAL LUMBAR INTERBODY FUSION (TLIF) WITH PEDICLE SCREW FIXATION 1 LEVEL Bilateral 03/12/2019   Procedure: Lumbar four-five Open transforaminal lumbar interbody fusion with Lumbar four-five laminectomy, bilateral Lumbar four-five Pedicle screw placement;  Surgeon: Judith Part, MD;  Location: [place];  Service: Neurosurgery;  Laterality: Bilateral;   TUMOR REMOVAL     FROM P[place]VIS AGE 30    Allergies  Allergies  Allergen Reactions   Bee Venom Anaphylaxis   Fentanyl Other (See Comments)    Very low BP, decrease LOC-received Narcan-in 2020    History of Present Illness    Julie Ross is a 61 y.o. female with a hx of nonobstructive coronary artery disease, hypertension, MS not on treatment due to side effects, meningiomas, hyperlipidemia, hypothyroidism, left breast cancer s/p radiation and chemotherapy, mild mitral regurgitation, hiatal hernia last seen 06/29/21.  She has been  monitored with serial echoes due to treatment of left breast cancer with radiation, chemotherapy, antibody therapy.  Echo 03/2013 with normal systolic and diastolic function and mild mitral regurgitation.  She was seen 04/06/2021 by Dr. Debara Pickett in consult for shortness of breath, chest pressure, palpitations. She was recommended for echocardiogram and cardiac CTA.  Echo 04/19/2021 LVEF 60 to 01%, grade 1 diastolic dysfunction, RV normal size and function, trivial MR, mild calcification of aortic valve without stenosis.  Cardiac CTA 04/23/2021 showed coronary calcium score of 4.25 placing her in the 63rd percentile for age/sex showing only mild nonobstructive coronary disease.  She did have small to moderate hiatal hernia which could be contributory to symptoms.  Last seen 06/29/21. She was enjoying spending time with her 4 year old granddaughter  and had another granddaughter on the way. Due to known CAD, Aspirin EC '81mg'$  was initiated.   She presents today for follow up. She noted to be doing well overall. She notes that she spends much time with her granddaughters going to the movies and park. She does note one episode of chest pain that last week that she attributed to stress over taking her granddaughter to the park. The chest discomfort quickly resolved without intervention and she denies any more symptoms. She noted that she is experiencing slight dizziness when she goes from a sitting to standing position that is intermittent and started around three weeks ago.  No near-syncope, syncope.  She is currently non-compliant with her CPAP machine due to the mask being uncomfortable while she sleeps. She does not currently take her blood pressure at home.    EKGs/Labs/Other Studies Reviewed:   The following studies were reviewed today:  Cardiac CTA 04/23/21 Non cardiac IMPRESSION: 1. No acute cardiopulmonary abnormalities. 2. Small to moderate size hiatal hernia.   Cardiac FINDINGS: Non-cardiac: See  separate report from [facility]. No significant findings on limited lung and soft tissue windows.   Calcium Score: Minute calcium noted in LAD and circumflex   Coronary Arteries: Right dominant with no anomalies   LM: Normal   LAD: Normal   D1: Large vessel 1-24% calcified plaque in distal vessel   D2: Normal   Circumflex: 1-24% calcified plaque in proximal vessel   OM1: Normal   OM2: Normal   RCA: Normal   PDA: Normal   PLA: Normal   IMPRESSION: 1.  Calcium score 4.25 which is 63 rd percentile for age and sex   2.  Normal ascending aortic root diameter 3.6 cm   3.  CAD RADS 1 non obstructive CAD see description above   Echo 04/19/21   1. Left ventricular ejection fraction, by estimation, is 60 to 65%. The  left ventricle has normal function. The left ventricle has no regional  wall motion abnormalities. Left ventricular diastolic parameters are  consistent with Grade I diastolic  dysfunction (impaired relaxation). The average left ventricular global  longitudinal strain is -22.2 %. The global longitudinal strain is normal.   2. Right ventricular systolic function is normal. The right ventricular  size is normal. There is normal pulmonary artery systolic pressure. The  estimated right ventricular systolic pressure is 99.2 mmHg.   3. The mitral valve is normal in structure. Trivial mitral valve  regurgitation. No evidence of mitral stenosis.   4. The aortic valve is tricuspid. There is mild calcification of the  aortic valve. Aortic valve regurgitation is not visualized. No aortic  stenosis is present.   5. The inferior vena cava is normal in size with greater than 50%  respiratory variability, suggesting right atrial pressure of 3 mmHg.   EKG:  EKG ordered today. The EKG performed today demonstrates NSR 60 bpm with no acute ST/T wave changes.  Recent Labs: 10/06/2021: ALT 13; Hemoglobin 11.7; Platelets 214.0; TSH 1.68 10/22/2021: BUN 17; Creatinine, Ser  1.03; Potassium 3.8; Sodium 135  Recent Lipid Panel    Component Value Date/Time   CHOL 132 10/06/2021 1449   TRIG 126.0 10/06/2021 1449   HDL 66.60 10/06/2021 1449   CHOLHDL 2 10/06/2021 1449   VLDL 25.2 10/06/2021 1449   LDLCALC 40 10/06/2021 1449   LDLCALC 64 06/26/2017 1603   LDLDIRECT 62.0 03/31/2021 1504    Home Medications   Current Meds  Medication Sig   Ascorbic Acid (  VITAMIN C PO) Take by mouth.   aspirin EC 81 MG tablet Take 1 tablet (81 mg total) by mouth daily. Swallow whole.   atorvastatin (LIPITOR) 20 MG tablet TAKE 1 TABLET BY MOUTH DAILY   baclofen (LIORESAL) 10 MG tablet Take 1 tablet (10 mg total) by mouth 3 (three) times daily as needed for muscle spasms.   buPROPion (WELLBUTRIN XL) 150 MG 24 hr tablet Take 1 tablet (150 mg total) by mouth daily.   clonazePAM (KLONOPIN) 1 MG tablet TAKE 1 TABLET(1 MG) BY MOUTH AT BEDTIME   EPINEPHrine 0.3 mg/0.3 mL IJ SOAJ injection Inject 0.3 mg into the muscle as needed for anaphylaxis.   gabapentin (NEURONTIN) 600 MG tablet Take 1 tablet (600 mg total) by mouth 3 (three) times daily.   HYDROcodone-acetaminophen (NORCO/VICODIN) 5-325 MG tablet    levothyroxine (SYNTHROID) 88 MCG tablet Take 1 tablet (88 mcg total) by mouth daily before breakfast.   losartan-hydrochlorothiazide (HYZAAR) 100-25 MG tablet TAKE 1 TABLET EVERY MORNING BEFORE BREAKFAST   omeprazole (PRILOSEC) 40 MG capsule TAKE 1 CAPSULE BY MOUTH DAILY   ondansetron (ZOFRAN) 4 MG tablet Take 1 tablet (4 mg total) by mouth every 8 (eight) hours as needed for nausea or vomiting.   potassium chloride SA (KLOR-CON M) 20 MEQ tablet TAKE 1 TABLET BY MOUTH DAILY   sertraline (ZOLOFT) 50 MG tablet Take 1 tablet (50 mg total) by mouth daily.     Review of Systems      All other systems reviewed and are otherwise negative except as noted above.  Physical Exam    VS:  BP 110/72   Pulse 60   Ht '5\' 6"'$  (1.676 m)   Wt 198 lb (89.8 kg)   LMP 12/05/2011   BMI 31.96 kg/m   , BMI Body mass index is 31.96 kg/m.  Wt Readings from Last 3 Encounters:  04/01/22 198 lb (89.8 kg)  03/22/22 199 lb 1.6 oz (90.3 kg)  11/30/21 200 lb (90.7 kg)     GEN: Well nourished, overweight, well developed, in no acute distress. HEENT: normal. Neck: Supple, no JVD, carotid bruits, or masses. Cardiac: RRR, no murmurs, rubs, or gallops. No clubbing, cyanosis, edema.  Radials/PT 2+ and equal bilaterally.  Respiratory:  Respirations regular and unlabored, clear to auscultation bilaterally. GI: Soft, nontender, nondistended. MS: No deformity or atrophy. Skin: Warm and dry, no rash. Neuro:  Strength and sensation are intact. Psych: Normal affect.  Assessment & Plan    CAD - Mild nonobstructive disease by cardiac CTA 04/23/21. GDMT Aspirin, Atorvastatin. No beta blocker due to previous bradycardia. Heart healthy diet and regular cardiovascular exercise encouraged.  No ischemic symptoms.  No need for ischemic evaluation.   HTN - BP well controlled. Continue current antihypertensive regimen. Discussed to monitor BP at home at least 2 hours after medications and sitting for 5-10 minutes.   HLD, LDL goal <70 - 09/2021 LDL 40. Continue Atorvastatin '20mg'$  QD. Denies myalgia.   OSA: Currently non-compliant. She has had her mask replaced recently to aid in her sleeping on her side. Encouraged compliance and educated on CVD risk associated with untreated OSA.   Trivial MR -noted by echo 03/2021. Continue optimal BP and volume control. Consider periodic echocardiogram for monitoring as clinically indicated.   Lightheaded/Dizziness: Consistent with orthostatic hypotension. Encouraged adequate hydration, moving from sitting to standing position slowly, and compression stockings.  As symptoms are infrequent and overall not bothersome we will not pursue further evaluation at this time.  Disposition: Follow  up in 6 month(s) with Pixie Casino, MD or APP.  Signed, Loel Dubonnet,  NP 04/01/2022, 2:02 PM [place]

## 2022-04-04 ENCOUNTER — Encounter: Payer: Self-pay | Admitting: Family Medicine

## 2022-04-04 ENCOUNTER — Ambulatory Visit (INDEPENDENT_AMBULATORY_CARE_PROVIDER_SITE_OTHER): Payer: Medicare Other | Admitting: Family Medicine

## 2022-04-04 VITALS — BP 122/80 | HR 53 | Temp 97.0°F | Resp 16 | Ht 66.0 in | Wt 197.1 lb

## 2022-04-04 DIAGNOSIS — E669 Obesity, unspecified: Secondary | ICD-10-CM

## 2022-04-04 DIAGNOSIS — Z23 Encounter for immunization: Secondary | ICD-10-CM | POA: Diagnosis not present

## 2022-04-04 DIAGNOSIS — F339 Major depressive disorder, recurrent, unspecified: Secondary | ICD-10-CM

## 2022-04-04 DIAGNOSIS — I10 Essential (primary) hypertension: Secondary | ICD-10-CM

## 2022-04-04 DIAGNOSIS — E785 Hyperlipidemia, unspecified: Secondary | ICD-10-CM

## 2022-04-04 DIAGNOSIS — E039 Hypothyroidism, unspecified: Secondary | ICD-10-CM | POA: Diagnosis not present

## 2022-04-04 LAB — CBC WITH DIFFERENTIAL/PLATELET
Basophils Absolute: 0.1 10*3/uL (ref 0.0–0.1)
Basophils Relative: 0.9 % (ref 0.0–3.0)
Eosinophils Absolute: 0.2 10*3/uL (ref 0.0–0.7)
Eosinophils Relative: 3.4 % (ref 0.0–5.0)
HCT: 38.3 % (ref 36.0–46.0)
Hemoglobin: 12.5 g/dL (ref 12.0–15.0)
Lymphocytes Relative: 24.6 % (ref 12.0–46.0)
Lymphs Abs: 1.5 10*3/uL (ref 0.7–4.0)
MCHC: 32.8 g/dL (ref 30.0–36.0)
MCV: 77.6 fl — ABNORMAL LOW (ref 78.0–100.0)
Monocytes Absolute: 0.4 10*3/uL (ref 0.1–1.0)
Monocytes Relative: 6.3 % (ref 3.0–12.0)
Neutro Abs: 4 10*3/uL (ref 1.4–7.7)
Neutrophils Relative %: 64.8 % (ref 43.0–77.0)
Platelets: 236 10*3/uL (ref 150.0–400.0)
RBC: 4.94 Mil/uL (ref 3.87–5.11)
RDW: 14.9 % (ref 11.5–15.5)
WBC: 6.2 10*3/uL (ref 4.0–10.5)

## 2022-04-04 LAB — HEPATIC FUNCTION PANEL
ALT: 13 U/L (ref 0–35)
AST: 21 U/L (ref 0–37)
Albumin: 4.7 g/dL (ref 3.5–5.2)
Alkaline Phosphatase: 122 U/L — ABNORMAL HIGH (ref 39–117)
Bilirubin, Direct: 0.2 mg/dL (ref 0.0–0.3)
Total Bilirubin: 0.9 mg/dL (ref 0.2–1.2)
Total Protein: 7.7 g/dL (ref 6.0–8.3)

## 2022-04-04 LAB — LIPID PANEL
Cholesterol: 144 mg/dL (ref 0–200)
HDL: 66.7 mg/dL (ref >39.00)
LDL Cholesterol: 61 mg/dL (ref 0–99)
NonHDL: 77.75
Total CHOL/HDL Ratio: 2
Triglycerides: 82 mg/dL (ref 0.0–149.0)
VLDL: 16.4 mg/dL (ref 0.0–40.0)

## 2022-04-04 LAB — BASIC METABOLIC PANEL
BUN: 14 mg/dL (ref 6–23)
CO2: 26 mEq/L (ref 19–32)
Calcium: 9.4 mg/dL (ref 8.4–10.5)
Chloride: 97 mEq/L (ref 96–112)
Creatinine, Ser: 0.99 mg/dL (ref 0.40–1.20)
GFR: 60.65 mL/min (ref >60.00)
Glucose, Bld: 101 mg/dL — ABNORMAL HIGH (ref 70–99)
Potassium: 3.6 mEq/L (ref 3.5–5.1)
Sodium: 134 mEq/L — ABNORMAL LOW (ref 135–145)

## 2022-04-04 LAB — TSH: TSH: 0.95 u[IU]/mL (ref 0.35–5.50)

## 2022-04-04 MED ORDER — SERTRALINE HCL 100 MG PO TABS: 100.0000 mg | ORAL_TABLET | Freq: Every day | ORAL | 3 refills | Status: DC

## 2022-04-04 NOTE — Assessment & Plan Note (Signed)
Deteriorated.  Pt reports increased irritability and lack of patience.  Will increase sertraline to '100mg'$  daily.  Pt expressed understanding and is in agreement w/ plan.

## 2022-04-04 NOTE — Progress Notes (Signed)
   Subjective:    Patient ID: Julie Ross, female    DOB: 1960-10-04, 61 y.o.   MRN: [id number]  HPI HTN- chronic problem, on Losartan HCTZ 100/'25mg'$  daily.  No CP, SOB, HAs, visual changes, edema.  Hyperlipidemia- chronic problem, on Lipitor '20mg'$  daily.  Denies abd pain, N/V  Hypothyroid- chronic problem, on Levothyroxine 79mg daily.  No changes to skin/hair/nails.  No changes in energy level.  Obesity- down 6 lbs since last visit.  Pt is watching what she eats and [facility]  Depression- pt reports increased irritability.  Lack of patience   Review of Systems For ROS see HPI     Objective:   Physical Exam Vitals reviewed.  Constitutional:      General: She is not in acute distress.    Appearance: Normal appearance. She is well-developed. She is obese. She is not ill-appearing.  HENT:     Head: Normocephalic and atraumatic.  Eyes:     Conjunctiva/sclera: Conjunctivae normal.     Pupils: Pupils are equal, round, and reactive to light.  Neck:     Thyroid: No thyromegaly.  Cardiovascular:     Rate and Rhythm: Normal rate and regular rhythm.     Pulses: Normal pulses.     Heart sounds: Normal heart sounds. No murmur heard. Pulmonary:     Effort: Pulmonary effort is normal. No respiratory distress.     Breath sounds: Normal breath sounds.  Abdominal:     General: There is no distension.     Palpations: Abdomen is soft.     Tenderness: There is no abdominal tenderness.  Musculoskeletal:     Cervical back: Normal range of motion and neck supple.     Right lower leg: No edema.     Left lower leg: No edema.  Lymphadenopathy:     Cervical: No cervical adenopathy.  Skin:    General: Skin is warm and dry.  Neurological:     General: No focal deficit present.     Mental Status: She is alert and oriented to person, place, and time.  Psychiatric:        Mood and Affect: Mood normal.        Behavior: Behavior normal.        Thought Content: Thought content normal.            Assessment & Plan:

## 2022-04-04 NOTE — Assessment & Plan Note (Signed)
Chronic problem.  On Levothyroxine 52mg w/o difficulty.  Check labs.  Adjust meds prn

## 2022-04-04 NOTE — Assessment & Plan Note (Signed)
Pt has lost 6 lbs since last visit.  She is watching her diet and [facility].  Applauded her efforts and encouraged her to continue.  Will follow.

## 2022-04-04 NOTE — Patient Instructions (Addendum)
Schedule your complete physical in 6 months We'll notify you of your lab results and make any changes if needed Continue to work on healthy diet and regular exercise- you're doing great! Increase the Sertraline to '100mg'$  daily- 2 of what you have at home and 1 of the new prescription Call with any questions or concerns Stay Safe!  Stay Healthy!

## 2022-04-04 NOTE — Assessment & Plan Note (Signed)
Well controlled today on Losartan HCTZ 100/'25mg'$  daily.  Asymptomatic at this time.  Check labs due to ARB and diuretic but no anticipated med changes.

## 2022-04-04 NOTE — Assessment & Plan Note (Signed)
Chronic problem.  Currently tolerating Lipitor '20mg'$  daily w/o difficulty.  Check labs.  Adjust meds prn

## 2022-04-05 ENCOUNTER — Other Ambulatory Visit: Payer: Self-pay | Admitting: Neurology

## 2022-04-05 NOTE — Progress Notes (Signed)
Informed pt of lab results  

## 2022-04-10 ENCOUNTER — Other Ambulatory Visit: Payer: Self-pay | Admitting: Neurology

## 2022-04-10 ENCOUNTER — Other Ambulatory Visit: Payer: Self-pay | Admitting: Family Medicine

## 2022-04-11 ENCOUNTER — Encounter: Payer: Self-pay | Admitting: Family Medicine

## 2022-04-27 DIAGNOSIS — M25562 Pain in left knee: Secondary | ICD-10-CM | POA: Diagnosis not present

## 2022-04-27 DIAGNOSIS — M25561 Pain in right knee: Secondary | ICD-10-CM | POA: Diagnosis not present

## 2022-05-27 ENCOUNTER — Other Ambulatory Visit: Payer: Self-pay | Admitting: Neurology

## 2022-06-01 DIAGNOSIS — R0981 Nasal congestion: Secondary | ICD-10-CM | POA: Diagnosis not present

## 2022-06-01 DIAGNOSIS — J069 Acute upper respiratory infection, unspecified: Secondary | ICD-10-CM | POA: Diagnosis not present

## 2022-06-13 ENCOUNTER — Other Ambulatory Visit: Payer: Self-pay | Admitting: Neurology

## 2022-06-17 ENCOUNTER — Encounter: Payer: Self-pay | Admitting: Neurology

## 2022-06-21 ENCOUNTER — Telehealth: Payer: Self-pay

## 2022-06-21 MED ORDER — GABAPENTIN 600 MG PO TABS: 600.0000 mg | ORAL_TABLET | Freq: Three times a day (TID) | ORAL | 1 refills | Status: DC

## 2022-06-21 NOTE — Telephone Encounter (Signed)
Rx sent 

## 2022-06-24 ENCOUNTER — Other Ambulatory Visit: Payer: Self-pay | Admitting: Neurology

## 2022-06-29 DIAGNOSIS — M25552 Pain in left hip: Secondary | ICD-10-CM | POA: Diagnosis not present

## 2022-06-29 DIAGNOSIS — M160 Bilateral primary osteoarthritis of hip: Secondary | ICD-10-CM | POA: Diagnosis not present

## 2022-06-29 DIAGNOSIS — M25551 Pain in right hip: Secondary | ICD-10-CM | POA: Diagnosis not present

## 2022-08-16 ENCOUNTER — Encounter: Payer: Self-pay | Admitting: Family Medicine

## 2022-08-25 ENCOUNTER — Other Ambulatory Visit: Payer: Self-pay | Admitting: Neurology

## 2022-08-29 NOTE — Telephone Encounter (Signed)
Last seen on 11/30/2021 Follow up scheduled on 12/01/22

## 2022-09-02 ENCOUNTER — Encounter (INDEPENDENT_AMBULATORY_CARE_PROVIDER_SITE_OTHER): Payer: Medicare Other | Admitting: Family Medicine

## 2022-09-02 DIAGNOSIS — R112 Nausea with vomiting, unspecified: Secondary | ICD-10-CM

## 2022-09-02 MED ORDER — ONDANSETRON HCL 4 MG PO TABS: 4.0000 mg | ORAL_TABLET | Freq: Three times a day (TID) | ORAL | 0 refills | Status: DC | PRN

## 2022-09-02 NOTE — Telephone Encounter (Signed)
[facility] VISIT   Patient agreed to [facility] visit and is aware that copayment and coinsurance may apply. Patient was treated using telemedicine according to accepted telemedicine protocols.  Subjective:   Patient complains of nausea  Patient Active Problem List   Diagnosis Date Noted   Elevated CEA 09/28/2021   Cerebral meningioma ([place]) 11/27/2020   Genetic testing 09/11/2020   Obesity (BMI 30-39.9) 07/14/2020   Meningioma ([place]) 06/16/2020   Arthritis 11/04/2019   Insomnia 08/22/2019   Lumbar radiculopathy 03/12/2019   Spondylolisthesis at L4-L5 level 08/24/2018   Facet syndrome, lumbar 08/24/2018   Chronic SI joint pain 03/30/2018   Delayed sleep phase syndrome 03/30/2018   History of optic neuritis 07/19/2016   Hyperlipidemia 06/18/2015   H/O malignant neoplasm of breast 02/18/2015   Gonalgia 02/18/2015   Relapsing remitting multiple sclerosis ([place]) 02/18/2015   Well adult exam 02/02/2015   Physical exam 12/17/2014   Other fatigue 10/15/2014   Obstructive sleep apnea 10/15/2014   Restless leg syndrome 10/15/2014   Allergic rhinitis 09/30/2014   Memory loss 10/14/2013   Depression, recurrent ([place]) 08/30/2013   S/P total knee arthroplasty 08/23/2013   Hypothyroidism 05/29/2013   Esophageal reflux 05/29/2013   Hx of radiation therapy    Essential hypertension, benign 06/11/2012   Multiple sclerosis ([place]) 01/23/2012   Breast cancer of upper-outer quadrant of right female breast ([place]) 11/22/2011   Osteoarthritis of both knees 08/15/2011   Social History   Tobacco Use   Smoking status: Never   Smokeless tobacco: Never  Substance Use Topics   Alcohol use: No    Current Outpatient Medications:    Ascorbic Acid (VITAMIN C PO), Take by mouth., Disp: , Rfl:    aspirin EC 81 MG tablet, Take 1 tablet (81 mg total) by mouth daily. Swallow whole., Disp: 90 tablet, Rfl: 3   atorvastatin (LIPITOR) 20 MG tablet, TAKE 1 TABLET BY MOUTH DAILY, Disp: 100 tablet, Rfl: 2   baclofen  (LIORESAL) 10 MG tablet, Take 1 tablet (10 mg total) by mouth 3 (three) times daily as needed for muscle spasms., Disp: 270 tablet, Rfl: 1   buPROPion (WELLBUTRIN XL) 150 MG 24 hr tablet, TAKE 1 TABLET BY MOUTH DAILY, Disp: 90 tablet, Rfl: 3   clonazePAM (KLONOPIN) 1 MG tablet, TAKE 1 TABLET BY MOUTH AT  BEDTIME, Disp: 90 tablet, Rfl: 1   EPINEPHrine 0.3 mg/0.3 mL IJ SOAJ injection, Inject 0.3 mg into the muscle as needed for anaphylaxis., Disp: 1 each, Rfl: 3   gabapentin (NEURONTIN) 600 MG tablet, TAKE 1 TABLET BY MOUTH 3 TIMES  DAILY, Disp: 300 tablet, Rfl: 0   HYDROcodone-acetaminophen (NORCO/VICODIN) 5-325 MG tablet, , Disp: , Rfl:    levothyroxine (SYNTHROID) 88 MCG tablet, Take 1 tablet (88 mcg total) by mouth daily before breakfast., Disp: 90 tablet, Rfl: 3   losartan-hydrochlorothiazide (HYZAAR) 100-25 MG tablet, TAKE 1 TABLET EVERY MORNING BEFORE BREAKFAST, Disp: 90 tablet, Rfl: 3   omeprazole (PRILOSEC) 40 MG capsule, TAKE 1 CAPSULE BY MOUTH DAILY, Disp: 100 capsule, Rfl: 2   ondansetron (ZOFRAN) 4 MG tablet, Take 1 tablet (4 mg total) by mouth every 8 (eight) hours as needed for nausea or vomiting., Disp: 20 tablet, Rfl: 0   potassium chloride SA (KLOR-CON M) 20 MEQ tablet, TAKE 1 TABLET BY MOUTH DAILY, Disp: 100 tablet, Rfl: 2   sertraline (ZOLOFT) 100 MG tablet, Take 1 tablet (100 mg total) by mouth daily., Disp: 90 tablet, Rfl: 3  Allergies  Allergen Reactions   Bee  Venom Anaphylaxis   Fentanyl Other (See Comments)    Very low BP, decrease LOC-received Narcan-in 2020    Assessment and Plan:   Diagnosis: nausea. Please see myChart communication and orders below.   No orders of the defined types were placed in this encounter.  Meds ordered this encounter  Medications   ondansetron (ZOFRAN) 4 MG tablet    Sig: Take 1 tablet (4 mg total) by mouth every 8 (eight) hours as needed for nausea or vomiting.    Dispense:  20 tablet    Refill:  0    Annye Asa,  MD 09/02/2022  A total of 5 minutes were spent by me to personally review the patient-generated inquiry, review patient records and data pertinent to assessment of the patient's problem, develop a management plan including generation of prescriptions and/or orders, and on subsequent communication with the patient through secure the MyChart portal service.   There is no separately reported E/M service related to this service in the past 7 days nor does the patient have an upcoming soonest available appointment for this issue. This work was completed in less than 7 days.   The patient consented to this service today (see patient agreement prior to ongoing communication). Patient counseled regarding the need for in-person exam for certain conditions and was advised to call the office if any changing or worsening symptoms occur.   The codes to be used for the E/M service are: [x]   99421 for 5-10 minutes of time spent on the inquiry. []   [id number] for 11-20 minutes. []   [id number] for 21+ minutes.

## 2022-09-13 ENCOUNTER — Other Ambulatory Visit: Payer: Self-pay | Admitting: Family

## 2022-09-13 DIAGNOSIS — I10 Essential (primary) hypertension: Secondary | ICD-10-CM

## 2022-09-15 ENCOUNTER — Other Ambulatory Visit: Payer: Self-pay | Admitting: Neurology

## 2022-10-06 ENCOUNTER — Encounter: Payer: Self-pay | Admitting: Family Medicine

## 2022-10-06 ENCOUNTER — Other Ambulatory Visit: Payer: Self-pay | Admitting: Neurology

## 2022-10-06 DIAGNOSIS — I10 Essential (primary) hypertension: Secondary | ICD-10-CM

## 2022-10-06 NOTE — Telephone Encounter (Signed)
Last seen on 11/30/21 Follow up scheduled on 12/01/22 Last filled on 07/01/22 #90 tablet (30 day supply) Rx pending to be signed

## 2022-10-07 MED ORDER — LEVOTHYROXINE SODIUM 88 MCG PO TABS: 88.0000 ug | ORAL_TABLET | Freq: Every day | ORAL | 1 refills | Status: DC

## 2022-10-07 MED ORDER — LOSARTAN POTASSIUM-HCTZ 100-25 MG PO TABS
ORAL_TABLET | ORAL | 1 refills | Status: DC
Start: 2022-10-07 — End: 2022-12-12

## 2022-10-10 ENCOUNTER — Encounter: Payer: Self-pay | Admitting: Family Medicine

## 2022-10-10 ENCOUNTER — Ambulatory Visit (INDEPENDENT_AMBULATORY_CARE_PROVIDER_SITE_OTHER): Payer: Medicare Other | Admitting: Family Medicine

## 2022-10-10 VITALS — BP 118/78 | HR 52 | Temp 97.8°F | Resp 17 | Ht 66.0 in | Wt 196.0 lb

## 2022-10-10 DIAGNOSIS — I10 Essential (primary) hypertension: Secondary | ICD-10-CM | POA: Diagnosis not present

## 2022-10-10 DIAGNOSIS — E559 Vitamin D deficiency, unspecified: Secondary | ICD-10-CM

## 2022-10-10 DIAGNOSIS — T782XXD Anaphylactic shock, unspecified, subsequent encounter: Secondary | ICD-10-CM | POA: Diagnosis not present

## 2022-10-10 DIAGNOSIS — G35 Multiple sclerosis: Secondary | ICD-10-CM

## 2022-10-10 DIAGNOSIS — Z0000 Encounter for general adult medical examination without abnormal findings: Secondary | ICD-10-CM

## 2022-10-10 LAB — HEPATIC FUNCTION PANEL
ALT: 11 U/L (ref 0–35)
AST: 17 U/L (ref 0–37)
Albumin: 4.3 g/dL (ref 3.5–5.2)
Alkaline Phosphatase: 119 U/L — ABNORMAL HIGH (ref 39–117)
Bilirubin, Direct: 0.2 mg/dL (ref 0.0–0.3)
Total Bilirubin: 0.8 mg/dL (ref 0.2–1.2)
Total Protein: 7.1 g/dL (ref 6.0–8.3)

## 2022-10-10 LAB — VITAMIN D 25 HYDROXY (VIT D DEFICIENCY, FRACTURES): VITD: 30.26 ng/mL (ref 30.00–100.00)

## 2022-10-10 LAB — LIPID PANEL
Cholesterol: 139 mg/dL (ref 0–200)
HDL: 64.7 mg/dL (ref >39.00)
LDL Cholesterol: 57 mg/dL (ref 0–99)
NonHDL: 73.8
Total CHOL/HDL Ratio: 2
Triglycerides: 84 mg/dL (ref 0.0–149.0)
VLDL: 16.8 mg/dL (ref 0.0–40.0)

## 2022-10-10 LAB — CBC WITH DIFFERENTIAL/PLATELET
Basophils Absolute: 0 10*3/uL (ref 0.0–0.1)
Basophils Relative: 0.7 % (ref 0.0–3.0)
Eosinophils Absolute: 0.1 10*3/uL (ref 0.0–0.7)
Eosinophils Relative: 2.2 % (ref 0.0–5.0)
HCT: 36.3 % (ref 36.0–46.0)
Hemoglobin: 11.9 g/dL — ABNORMAL LOW (ref 12.0–15.0)
Lymphocytes Relative: 28.4 % (ref 12.0–46.0)
Lymphs Abs: 1.6 10*3/uL (ref 0.7–4.0)
MCHC: 32.7 g/dL (ref 30.0–36.0)
MCV: 76.7 fl — ABNORMAL LOW (ref 78.0–100.0)
Monocytes Absolute: 0.4 10*3/uL (ref 0.1–1.0)
Monocytes Relative: 7.3 % (ref 3.0–12.0)
Neutro Abs: 3.5 10*3/uL (ref 1.4–7.7)
Neutrophils Relative %: 61.4 % (ref 43.0–77.0)
Platelets: 243 10*3/uL (ref 150.0–400.0)
RBC: 4.73 Mil/uL (ref 3.87–5.11)
RDW: 16.1 % — ABNORMAL HIGH (ref 11.5–15.5)
WBC: 5.7 10*3/uL (ref 4.0–10.5)

## 2022-10-10 LAB — BASIC METABOLIC PANEL
BUN: 12 mg/dL (ref 6–23)
CO2: 28 mEq/L (ref 19–32)
Calcium: 9.3 mg/dL (ref 8.4–10.5)
Chloride: 97 mEq/L (ref 96–112)
Creatinine, Ser: 1.1 mg/dL (ref 0.40–1.20)
GFR: 53.25 mL/min — ABNORMAL LOW (ref >60.00)
Glucose, Bld: 95 mg/dL (ref 70–99)
Potassium: 4.1 mEq/L (ref 3.5–5.1)
Sodium: 134 mEq/L — ABNORMAL LOW (ref 135–145)

## 2022-10-10 LAB — TSH: TSH: 2.09 u[IU]/mL (ref 0.35–5.50)

## 2022-10-10 MED ORDER — EPINEPHRINE 0.3 MG/0.3ML IJ SOAJ
0.3000 mg | INTRAMUSCULAR | 3 refills | Status: AC | PRN
Start: 2022-10-10 — End: ?

## 2022-10-10 NOTE — Progress Notes (Signed)
   Subjective:    Patient ID: Julie Ross, female    DOB: 1960/08/20, 62 y.o.   MRN: [id number]  HPI CPE- UTD on pap, mammo, colonoscopy, immunizations.  Patient Care Team    Relationship Specialty Notifications Start End  Sheliah Hatch, MD PCP - General Family Medicine  07/31/19   Chrystie Nose, MD PCP - Cardiology Cardiology  06/29/21   Julio Sicks, NP Nurse Practitioner Obstetrics and Gynecology  12/17/14   Iva Boop, MD Consulting Physician Gastroenterology  12/17/14   Gean Birchwood, MD Consulting Physician Orthopedic Surgery  06/27/17   Janalyn Harder, MD (Inactive) Consulting Physician Dermatology  04/07/20   Asa Lente, MD  Neurology  07/14/20     Health Maintenance  Topic Date Due   Medicare Annual Wellness (AWV)  Never done   INFLUENZA VACCINE  01/19/2023   MAMMOGRAM  11/30/2023   PAP SMEAR-Modifier  12/02/2023   DTaP/Tdap/Td (3 - Td or Tdap) 08/20/2028   COLONOSCOPY (Pts 45-59yrs Insurance coverage will need to be confirmed)  11/12/2031   Hepatitis C Screening  Completed   Zoster Vaccines- Shingrix  Completed   HPV VACCINES  Aged Out   COVID-19 Vaccine  Discontinued   HIV Screening  Discontinued      Review of Systems Patient reports no vision/ hearing changes, adenopathy,fever, weight change,  persistant/recurrent hoarseness , swallowing issues, chest pain, palpitations, edema, persistant/recurrent cough, hemoptysis, dyspnea (rest/exertional/paroxysmal nocturnal), gastrointestinal bleeding (melena, rectal bleeding), abdominal pain, significant heartburn, bowel changes, GU symptoms (dysuria, hematuria, incontinence), Gyn symptoms (abnormal  bleeding, pain),  syncope, focal weakness, memory loss, numbness & tingling, skin/hair/nail changes, abnormal bruising or bleeding, anxiety, or depression.     Objective:   Physical Exam General Appearance:    Alert, cooperative, no distress, appears stated age  Head:    Normocephalic, without obvious abnormality,  atraumatic  Eyes:    PERRL, conjunctiva/corneas clear, EOM's intact both eyes  Ears:    Normal TM's and external ear canals, both ears  Nose:   Nares normal, septum midline, mucosa normal, no drainage    or sinus tenderness  Throat:   Lips, mucosa, and tongue normal; teeth and gums normal  Neck:   Supple, symmetrical, trachea midline, no adenopathy;    Thyroid: no enlargement/tenderness/nodules  Back:     Symmetric, no curvature, ROM normal, no CVA tenderness  Lungs:     Clear to auscultation bilaterally, respirations unlabored  Chest Wall:    No tenderness or deformity   Heart:    Regular rate and rhythm, S1 and S2 normal, no murmur, rub   or gallop  Breast Exam:    Deferred to GYN  Abdomen:     Soft, non-tender, bowel sounds active all four quadrants,    no masses, no organomegaly  Genitalia:    Deferred to GYN  Rectal:    Extremities:   Extremities normal, atraumatic, no cyanosis or edema  Pulses:   2+ and symmetric all extremities  Skin:   Skin color, texture, turgor normal, no rashes or lesions  Lymph nodes:   Cervical, supraclavicular, and axillary nodes normal  Neurologic:   CNII-XII intact, normal strength, sensation and reflexes    throughout          Assessment & Plan:

## 2022-10-10 NOTE — Patient Instructions (Signed)
Follow up in 6 months to recheck blood pressure and cholesterol We'll notify you of your lab results and make any changes if needed Keep up the good work on healthy diet and regular exercise- you look great!!! Call with any questions or concerns Stay Safe!  Stay Healthy! Happy Spring!!!

## 2022-10-10 NOTE — Assessment & Plan Note (Signed)
Pt's PE WNL w/ exception of BMI.  UTD on pap, mammo, colonoscopy, immunizations.  Check labs.  Anticipatory guidance provided.  

## 2022-10-10 NOTE — Assessment & Plan Note (Signed)
Chronic problem.  Following w/ Dr Epimenio Foot.

## 2022-10-10 NOTE — Assessment & Plan Note (Signed)
Chronic problem.  Currently well controlled.  Asymptomatic.  Check labs but no anticipated med changes. ?

## 2022-10-10 NOTE — Assessment & Plan Note (Signed)
Check labs and replete prn. 

## 2022-11-03 ENCOUNTER — Other Ambulatory Visit: Payer: Self-pay | Admitting: Neurology

## 2022-11-07 NOTE — Telephone Encounter (Signed)
Last seen on 11/30/21 per note  Follow up scheduled on 12/01/22  Last filled on 08/30/22 #300 tablets (100 day supply)

## 2022-11-09 DIAGNOSIS — H43812 Vitreous degeneration, left eye: Secondary | ICD-10-CM | POA: Diagnosis not present

## 2022-11-09 DIAGNOSIS — H52223 Regular astigmatism, bilateral: Secondary | ICD-10-CM | POA: Diagnosis not present

## 2022-11-09 DIAGNOSIS — H5213 Myopia, bilateral: Secondary | ICD-10-CM | POA: Diagnosis not present

## 2022-11-09 DIAGNOSIS — H524 Presbyopia: Secondary | ICD-10-CM | POA: Diagnosis not present

## 2022-11-09 DIAGNOSIS — H2513 Age-related nuclear cataract, bilateral: Secondary | ICD-10-CM | POA: Diagnosis not present

## 2022-12-01 ENCOUNTER — Encounter: Payer: Self-pay | Admitting: Neurology

## 2022-12-01 ENCOUNTER — Ambulatory Visit: Payer: Medicare Other | Admitting: Neurology

## 2022-12-01 VITALS — BP 135/83 | HR 59 | Ht 66.0 in | Wt 195.5 lb

## 2022-12-01 DIAGNOSIS — R5383 Other fatigue: Secondary | ICD-10-CM | POA: Diagnosis not present

## 2022-12-01 DIAGNOSIS — R269 Unspecified abnormalities of gait and mobility: Secondary | ICD-10-CM

## 2022-12-01 DIAGNOSIS — G4733 Obstructive sleep apnea (adult) (pediatric): Secondary | ICD-10-CM | POA: Diagnosis not present

## 2022-12-01 DIAGNOSIS — D329 Benign neoplasm of meninges, unspecified: Secondary | ICD-10-CM

## 2022-12-01 DIAGNOSIS — G35 Multiple sclerosis: Secondary | ICD-10-CM

## 2022-12-01 DIAGNOSIS — Z853 Personal history of malignant neoplasm of breast: Secondary | ICD-10-CM

## 2022-12-01 DIAGNOSIS — G4721 Circadian rhythm sleep disorder, delayed sleep phase type: Secondary | ICD-10-CM

## 2022-12-01 NOTE — Progress Notes (Signed)
[facility]  PATIENT: Julie Ross DOB: 1961-03-30  REFERRING DOCTOR OR PCP:  Neena Rhymes SOURCE: patient and records from [facility]  _________________________________   HISTORICAL  CHIEF COMPLAINT:  Chief Complaint  Patient presents with   Room 10    Pt is here Alone. Pt states that she is fatigued all the time. Pt states that sometimes she wakes up at night because her legs are aching. Pt states no other symptoms she would like to report today.     HISTORY OF PRESENT ILLNESS:  Julie Ross is a 62 y.o. woman with multiple sclerosis and breast cancer.    Update 11/30/2021: She has been off a DMT (she had been on Chemo for breast cancer in 2013/2014 and stopped around that time.   .  She has no recent exacerbations.  She continues to note some progression with reduced gait and balance.  She does not note much weakness or dysesthesias.  Bladder is doing well.  MS lesions on the MRI of the brain 10/01/2020 was unchanged compared to the MRI 06/16/2020 and 04/13/2018 MRI is also showed meningiomas that have increased in size.   She also has meningiomas and sees Dr. Arsenio Loader.  She had stereotactic radiosurgery 10/06/2020 for the meningiomas (left sphenoid wing and left tentorial)   She sees Dr. Arsenio Loader again in October  She notes the gait and balance are stable.    She has stumbles but not falls.    She has trouble with stairs and uses the bannister.  She denies numbness.    Bladder function is unchanged.        She has noted more fatigue.   Sleep is variable at night.  She often takes a short nap after getting up at 830.  She then lays down on the couch and dozes off again.    Due to more fatigue.  We did a PSG.  It showed minimal OSA with an AHI of 2.5.  However, she had moderate REM OSA but very little REM sleep was recorded.  Insomnia is better with clonazepam and this helps her sleep through the night better than Xanax did.      Provigil caused her to feel  jittery and phentermine caused stomach upset n the past.    She appears to have delayed sleep phase and goes to bed at 130 am but sets alarm for 830 a.m. to let dogs out (she lets them out at 10 pm)   we discussed matching up her and her dogs schedule more to allow her to have 8+ hours of sleep.      She had breast cancer in 2013.   She was seeing Dr. Pamelia Hoit.   She has no recurrence and is off all medications for it now.   She had neurosurgery at [facility]3 (had synovial cyst and spondylolisthesis; Dr. Maurice Small).   Back is doing better.   Polysomnogram 11/21/2022showed: Negligible overall Obstructive Sleep Apnea with AHI = 2.5.  However, there was moderate REM related OSA with a REM AHI = 22. No significant PLMS Delayed sleep latency.  Normal sleep efficiency. All stages of sleep were recorded.   MS History:    She was diagnosed with MS in 1999 after presenting with optic neuritis.   She had a spinal tap, MRI and angiogram.   Initially, the diagnosis was uncertain but she had changes in the MRI over time leading to a diagnosis of MS a few years later.    Initially, she  was placed on Copaxone, then switched to Avonex due to skin reactions.   She then switched to Tysabri when she had some mild breakthrough disease. She tolerated Tysabri very well and her MS did well.  However, she was JCV antibody positive so she stopped in 2012.   She did not restart Avonex as she did not want to go back to an injection.    Her last exacerbation was optic neuritis in 2012 treated with steroids. She proved but did not get back to baseline.  This exacerbation occurred off any DMT.  She tried Gilenya in 2013. She had trouble tolerating Gilenya and discontinued shortly after starting it. She was going to start Tecfidera but was diagnosed with breast cancer in 2013 and opted not to start at that time. She received chemotherapy including Taxotere, carboplatin, Herceptin in 2013 and 2014. An MRI of the brain in 2015 showed only mild  MS progression compared to her previous one in 2011.   She stopped disease modifying therapies in 2014 and 2015.  Subsequent MRIs have been stable.  IMAGING: MRI of the brain 04/13/2018 showed multiple T2/FLAIR hyperintense foci in the brainstem, cerebellum and hemispheres in a pattern and configuration consistent with demyelinating plaque associated with multiple sclerosis.  One focus in the right splenium was not present on the 2017 MRI and shows some enhancement without edema consistent with a subacute demyelinating plaque.  Other foci were present on the 2017 MRI.     Two meningeal based foci (left sphenoid wing and left tentorium) as described above consistent with meningiomas.  They are described as the same size on the 2018 MRI report.   Complex stable pineal cyst  MRI of the brain 06/16/2020 showed stable MS lesions.  These meningiomas had increased in size compared to 2019  MRI of the brain 10/01/2020 showed no further changes compared to 06/16/2020  REVIEW OF SYSTEMS: Constitutional: No fevers, chills, sweats, or change in appetite.  Notes fatigue Eyes: No visual changes, double vision, eye pain Ear, nose and throat: No hearing loss, ear pain, nasal congestion, sore throat.  Recent URI Cardiovascular: No chest pain, palpitations Respiratory:  No shortness of breath at rest or with exertion.   No wheezes.  Has OSA GastrointestinaI: No nausea, vomiting, diarrhea, abdominal pain, fecal incontinence Genitourinary:  No dysuria, urinary retention or frequency.  She has 1 times nocturia most nights Musculoskeletal:  No neck pain, back pain.  She has bilateral knee pain Integumentary: No rash, pruritus, skin lesions Neurological: as above Psychiatric: No depression at this time.  Some anxiety Endocrine: No palpitations, diaphoresis, change in appetite, change in weigh or increased thirst Hematologic/Lymphatic:  No anemia, purpura, petechiae. Allergic/Immunologic: No itchy/runny eyes, nasal  congestion, recent allergic reactions, rashes  ALLERGIES: Allergies  Allergen Reactions   Bee Venom Anaphylaxis   Fentanyl Other (See Comments)    Very low BP, decrease LOC-received Narcan-in 2020    HOME MEDICATIONS:  Current Outpatient Medications:    Ascorbic Acid (VITAMIN C PO), Take by mouth., Disp: , Rfl:    aspirin EC 81 MG tablet, Take 1 tablet (81 mg total) by mouth daily. Swallow whole., Disp: 90 tablet, Rfl: 3   atorvastatin (LIPITOR) 20 MG tablet, TAKE 1 TABLET BY MOUTH DAILY, Disp: 100 tablet, Rfl: 2   baclofen (LIORESAL) 10 MG tablet, TAKE 1 TABLET BY MOUTH 3 TIMES  DAILY AS NEEDED FOR MUSCLE  SPASM(S), Disp: 270 tablet, Rfl: 3   buPROPion (WELLBUTRIN XL) 150 MG 24 hr tablet, TAKE  1 TABLET BY MOUTH DAILY, Disp: 90 tablet, Rfl: 3   clonazePAM (KLONOPIN) 1 MG tablet, TAKE 1 TABLET BY MOUTH AT  BEDTIME, Disp: 90 tablet, Rfl: 1   EPINEPHrine 0.3 mg/0.3 mL IJ SOAJ injection, Inject 0.3 mg into the muscle as needed for anaphylaxis., Disp: 1 each, Rfl: 3   gabapentin (NEURONTIN) 600 MG tablet, TAKE 1 TABLET BY MOUTH 3 TIMES  DAILY, Disp: 300 tablet, Rfl: 0   HYDROcodone-acetaminophen (NORCO/VICODIN) 5-325 MG tablet, , Disp: , Rfl:    levothyroxine (SYNTHROID) 88 MCG tablet, Take 1 tablet (88 mcg total) by mouth daily before breakfast., Disp: 90 tablet, Rfl: 1   losartan-hydrochlorothiazide (HYZAAR) 100-25 MG tablet, TAKE 1 TABLET EVERY MORNING BEFORE BREAKFAST, Disp: 90 tablet, Rfl: 1   omeprazole (PRILOSEC) 40 MG capsule, TAKE 1 CAPSULE BY MOUTH DAILY, Disp: 100 capsule, Rfl: 2   ondansetron (ZOFRAN) 4 MG tablet, Take 1 tablet (4 mg total) by mouth every 8 (eight) hours as needed for nausea or vomiting., Disp: 20 tablet, Rfl: 0   potassium chloride SA (KLOR-CON M) 20 MEQ tablet, TAKE 1 TABLET BY MOUTH DAILY, Disp: 100 tablet, Rfl: 2   sertraline (ZOLOFT) 100 MG tablet, Take 1 tablet (100 mg total) by mouth daily., Disp: 90 tablet, Rfl: 3  PAST MEDICAL HISTORY: Past Medical  History:  Diagnosis Date   Anemia    h/o - ? bld. transfusion - more recent- ?2013   Anxiety    uses xanax mainly for sleep   Arthritis    knees    Basal cell carcinoma 01/11/2005   rim left nostril-,mohs   BCC (basal cell carcinoma) 01/11/2005   right cheek mid-mohs   BCC (basal cell carcinoma) 09/12/2006   right forehead -mohs   BCC (basal cell carcinoma) 01/19/2016   right upper arm -[id number]   BCC (basal cell carcinoma) 02/18/2019   forehead-mohs   Breast cancer (HCC) 11/2011   lul/ER+PR+, x1 lymph node    Cancer (HCC)    squamous cell skin cancer x7   Complication of anesthesia    Depression    GERD (gastroesophageal reflux disease)    H/O bone density study 03/2011   H/O colonoscopy    H/O echocardiogram    last one 02/2013, due to chemotherapy   Heart burn    Hx of radiation therapy 04/17/12- 06/04/12   left chest wall, axilla, L supraclavicular fossa 4500 cGy, left mastectomy scar/chest wall 5940 cGy   Hypertension    Hypothyroidism    IBS (irritable bowel syndrome)    Meningioma (HCC) 08/2020   Multiple sclerosis (HCC)    Neuromuscular disorder (HCC)    MS TX WITH CYMBALTA    Personal history of chemotherapy    Personal history of radiation therapy    SCC (squamous cell carcinoma) 01/11/2005   chest mid   Sleep apnea    no CPAP   Squamous cell carcinoma of skin 01/11/2005   over left lip   Wears glasses     PAST SURGICAL HISTORY: Past Surgical History:  Procedure Laterality Date   APPLICATION OF ROBOTIC ASSISTANCE FOR SPINAL PROCEDURE N/A 03/12/2019   Procedure: APPLICATION OF ROBOTIC ASSISTANCE FOR SPINAL PROCEDURE;  Surgeon: Jadene Pierini, MD;  Location: [facility] OR;  Service: Neurosurgery;  Laterality: N/A;   BREAST REDUCTION SURGERY Right 01/25/2021   Procedure: MAMMARY REDUCTION  (BREAST);  Surgeon: Glenna Fellows, MD;  Location: [facility];  Service: Plastics;  Laterality: Right;   CESAREAN SECTION  X2    COLONOSCOPY      ESOPHAGOGASTRODUODENOSCOPY     FLEXIBLE SIGMOIDOSCOPY     I & D KNEE WITH POLY EXCHANGE  03/02/2012   Procedure: IRRIGATION AND DEBRIDEMENT KNEE WITH POLY EXCHANGE;  Surgeon: Harvie Junior, MD;  Location: [facility] OR;  Service: Orthopedics;  Laterality: Left;  I&D of total knee with Possible Poly Exchange    JOINT REPLACEMENT  07/22/2011   left knee- multiple surgeries    KNEE ARTHROSCOPY     LT KNEE 04/2011   KNEE ARTHROSCOPY  06/20/1982   rt   MASTECTOMY     MASTECTOMY W/ SENTINEL NODE BIOPSY  12/19/2011   Procedure: MASTECTOMY WITH SENTINEL LYMPH NODE BIOPSY;  Surgeon: Currie Paris, MD;  Location: [facility];  Service: General;  Laterality: Left;  left breast and left axilla    MOHS SURGERY     right face   PORT-A-CATH REMOVAL  03/06/2012   Procedure: REMOVAL PORT-A-CATH;  Surgeon: Adolph Pollack, MD;  Location[facility] OR;  Service: General;  Laterality: N/A;   PORT-A-CATH REMOVAL Right 08/22/2012   Procedure: REMOVAL PORT-A-CATH;  Surgeon: Mariella Saa, MD;  Location: [facility];  Service: General;  Laterality: Right;   PORTACATH PLACEMENT  01/16/2012   Procedure: INSERTION PORT-A-CATH;  Surgeon: Currie Paris, MD;  Location: [facility];  Service: General;  Laterality: Right;  Porta Cath Placement    PORTACATH PLACEMENT  05/01/2012   Procedure: INSERTION PORT-A-CATH;  Surgeon: Currie Paris, MD;  Location: [facility];  Service: General;  Laterality: Right;  right internal jugular port-a-cath insertion   TEE WITHOUT CARDIOVERSION  03/06/2012   Procedure: TRANSESOPHAGEAL ECHOCARDIOGRAM (TEE);  Surgeon: Wendall Stade, MD;  Location[facility] ENDOSCOPY;  Service: Cardiovascular;  Laterality: N/A;  Rm. [id number]   TONSILLECTOMY     TOTAL KNEE ARTHROPLASTY  08/15/2011   Procedure: TOTAL KNEE ARTHROPLASTY; lft Surgeon: Harvie Junior, MD;  Location: [facility];  Service: Orthopedics;  Laterality: Left;  RIGHT KNEE CORTIZONE INJECTION   TOTAL KNEE ARTHROPLASTY Left  08/18/2012   Procedure:  Irrigation and debridement of LEFT total knee;  Removal of total knee parts; Implant of spacers;  Surgeon: Nestor Lewandowsky, MD;  Location: [facility] OR;  Service: Orthopedics;  Laterality: Left;   TOTAL KNEE ARTHROPLASTY Right 08/12/2013   TOTAL KNEE ARTHROPLASTY Right 08/12/2013   Procedure: RIGHT TOTAL KNEE ARTHROPLASTY;  Surgeon: Nestor Lewandowsky, MD;  Location: [facility];  Service: Orthopedics;  Laterality: Right;   TOTAL KNEE REVISION Left 04/19/2013   Procedure: TOTAL KNEE REVISION AND REMOVAL CEMENT SPACER;  Surgeon: Nestor Lewandowsky, MD;  Location: [facility];  Service: Orthopedics;  Laterality: Left;   TRANSFORAMINAL LUMBAR INTERBODY FUSION (TLIF) WITH PEDICLE SCREW FIXATION 1 LEVEL Bilateral 03/12/2019   Procedure: Lumbar four-five Open transforaminal lumbar interbody fusion with Lumbar four-five laminectomy, bilateral Lumbar four-five Pedicle screw placement;  Surgeon: Jadene Pierini, MD;  Location: [facility];  Service: Neurosurgery;  Laterality: Bilateral;   TUMOR REMOVAL     FROM PELVIS AGE 73    FAMILY HISTORY: Family History  Problem Relation Age of Onset   Arthritis Mother    Hypertension Mother    Heart disease Mother        CHF   Dementia Father    Diabetes type II Father    Heart disease Maternal Grandfather    Breast cancer Paternal Grandmother        dx in her 20s  Lung cancer Maternal Uncle        smoker   Breast cancer Paternal Aunt        dx in her 32s   Ovarian cancer Paternal Aunt        dx in her 30s   Colon cancer Neg Hx    Stomach cancer Neg Hx    Esophageal cancer Neg Hx     SOCIAL HISTORY:  Social History   Socioeconomic History   Marital status: Married    Spouse name: Not on file   Number of children: 2   Years of education: Not on file   Highest education level: Not on file  Occupational History    Employer: [employer]    Occupation: disabled  Tobacco Use   Smoking status: Never   Smokeless tobacco: Never  Vaping Use    Vaping Use: Never used  Substance and Sexual Activity   Alcohol use: No   Drug use: No   Sexual activity: Yes    Birth control/protection: Post-menopausal  Other Topics Concern   Not on file  Social History Narrative   Right Handed   1 cup of Tea per Day   Social Determinants of Health   Financial Resource Strain: Not on file  Food Insecurity: Not on file  Transportation Needs: Not on file  Physical Activity: Not on file  Stress: Not on file  Social Connections: Not on file  Intimate Partner Violence: Not on file     PHYSICAL EXAM  Vitals:   12/01/22 1456  BP: 135/83  Pulse: (!) 59  Weight: 195 lb 8 oz (88.7 kg)  Height: 5\' 6"  (1.676 m)    Body mass index is 31.55 kg/m.   General: The patient is well-developed and well-nourished and in no acute distress.   Reduced range of motion of the lower back.  She is tender in the lower lumbar paraspinal region and over the piriformis/SI region bilaterally   Neurologic Exam  Mental status: The patient is alert and oriented x 3 at the time of the examination. The patient has apparent normal recent and remote memory, with an apparently normal attention span and concentration ability.   Speech is normal.  Cranial nerves: Extraocular movements are full.  Facial strength and sensation is normal. The tongue is midline, and the patient has symmetric elevation of the soft palate. No obvious hearing deficits are noted.  Motor:  Muscle bulk is normal.   Muscle tone is normal.  Strength appears to be normal in the legs except right EHL (4/5)  Sensory: Intact sensation to touch and vibration in legs.    Coordination: Finger-nose-finger is performed well...  Gait and station: Station is normal.   The gait is mildly wide.  Tandem gait is wide.   The Romberg is negative.  Reflexes: Deep tendon reflexes are symmetric and normal bilaterally in arms.  DTRs are 1 at the knees.  Deep tendon reflexes absent at the  ankles. ____________________________________________________  ASSESSMENT AND PLAN  Multiple sclerosis (HCC)  Meningioma (HCC)  OSA (obstructive sleep apnea)  History of breast cancer  Other fatigue  Gait disturbance  Delayed sleep phase syndrome   1.   She will continue off of her disease modifying therapy.  She has an MRI of the brain September to follow-up the meningiomas.  She will let us know when she has rhe scan and I will compare with her previous MRI.  If she does have breakthrough changes we will reconsider a disease  modifying therapy. 2.   Try to stay active 3.   Continue  clonazepam for insomnia.    4.  We discussed her sleep habits.  She likely has mild delayed phase sleep disorder.  She and her dogs are on a different schedule and that leads to her waking up before her night is through and then going back to sleep for a couple hours.  I recommended that she try to either advance her schedule slowly to go to bed around 11 PM or change the dog schedule to have them go out a little bit before she is ready to go to sleep at 130.  That way she is more likely to get 8 hours of straight sleep.  We did discuss we could consider a trial of Adderall but I would prefer her to try to just schedule first.   5.  She will return to see [place] in 12 months or sooner if she has new or worsening neurologic symptoms.    Desma Wilkowski A. Epimenio Foot, MD, PhD 12/01/2022, 5:12 PM Certified in Neurology, Clinical Neurophysiology, Sleep Medicine, Pain Medicine and Neuroimaging  [facility] [address] [place], [place] [address] [phone]

## 2022-12-06 DIAGNOSIS — Z1231 Encounter for screening mammogram for malignant neoplasm of breast: Secondary | ICD-10-CM | POA: Diagnosis not present

## 2022-12-11 ENCOUNTER — Other Ambulatory Visit: Payer: Self-pay | Admitting: Family Medicine

## 2022-12-11 DIAGNOSIS — I10 Essential (primary) hypertension: Secondary | ICD-10-CM

## 2022-12-12 ENCOUNTER — Ambulatory Visit: Payer: Medicare Other | Admitting: Family Medicine

## 2022-12-13 ENCOUNTER — Other Ambulatory Visit: Payer: Self-pay | Admitting: Family Medicine

## 2022-12-13 DIAGNOSIS — E785 Hyperlipidemia, unspecified: Secondary | ICD-10-CM

## 2022-12-23 ENCOUNTER — Encounter: Payer: Medicare Other | Admitting: Family Medicine

## 2022-12-28 ENCOUNTER — Ambulatory Visit (INDEPENDENT_AMBULATORY_CARE_PROVIDER_SITE_OTHER): Payer: Medicare Other | Admitting: *Deleted

## 2022-12-28 DIAGNOSIS — Z0000 Encounter for general adult medical examination without abnormal findings: Secondary | ICD-10-CM | POA: Diagnosis not present

## 2022-12-28 NOTE — Progress Notes (Signed)
Subjective:   Julie Ross is a 62 y.o. female who presents for Medicare Annual (Subsequent) preventive examination.  Visit Complete: Virtual  I connected with  [name] on 12/28/22 by a audio enabled telemedicine application and verified that I am speaking with the correct person using two identifiers.  Patient Location: Home  Provider Location: Home Office  I discussed the limitations of evaluation and management by telemedicine. The patient expressed understanding and agreed to proceed.  Patient Medicare AWV questionnaire was completed by the patient on 12-2022; I have confirmed that all information answered by patient is correct and no changes since this date.  Review of Systems     Cardiac Risk Factors include: advanced age (>73men, >36 women);family history of premature cardiovascular disease;hypertension     Objective:    Today's Vitals   There is no height or weight on file to calculate BMI.     12/28/2022    2:08 PM 03/22/2022   11:17 AM 03/22/2021   11:47 AM 01/25/2021   11:58 AM 01/19/2021    2:02 PM 10/06/2020   11:14 AM 03/12/2019    6:48 AM  Advanced Directives  Does Patient Have a Medical Advance Directive? Yes No No No No No No  Type of Media planner of Healthcare Power of Attorney in Chart? No - copy requested        Would patient like information on creating a medical advance directive?  No - Patient declined  No - Patient declined No - Patient declined No - Patient declined No - Patient declined    Current Medications (verified) Outpatient Encounter Medications as of 12/28/2022  Medication Sig   Ascorbic Acid (VITAMIN C PO) Take by mouth.   aspirin EC 81 MG tablet Take 1 tablet (81 mg total) by mouth daily. Swallow whole.   atorvastatin (LIPITOR) 20 MG tablet TAKE 1 TABLET BY MOUTH ONCE  DAILY   baclofen (LIORESAL) 10 MG tablet TAKE 1 TABLET BY MOUTH 3 TIMES  DAILY AS NEEDED FOR MUSCLE  SPASM(S)   buPROPion  (WELLBUTRIN XL) 150 MG 24 hr tablet TAKE 1 TABLET BY MOUTH DAILY   clonazePAM (KLONOPIN) 1 MG tablet TAKE 1 TABLET BY MOUTH AT  BEDTIME   EPINEPHrine 0.3 mg/0.3 mL IJ SOAJ injection Inject 0.3 mg into the muscle as needed for anaphylaxis.   gabapentin (NEURONTIN) 600 MG tablet TAKE 1 TABLET BY MOUTH 3 TIMES  DAILY   HYDROcodone-acetaminophen (NORCO/VICODIN) 5-325 MG tablet    levothyroxine (SYNTHROID) 88 MCG tablet TAKE 1 TABLET BY MOUTH DAILY  BEFORE BREAKFAST   omeprazole (PRILOSEC) 40 MG capsule TAKE 1 CAPSULE BY MOUTH DAILY   ondansetron (ZOFRAN) 4 MG tablet Take 1 tablet (4 mg total) by mouth every 8 (eight) hours as needed for nausea or vomiting.   potassium chloride SA (KLOR-CON M) 20 MEQ tablet TAKE 1 TABLET BY MOUTH DAILY   sertraline (ZOLOFT) 100 MG tablet Take 1 tablet (100 mg total) by mouth daily.   losartan-hydrochlorothiazide (HYZAAR) 100-25 MG tablet TAKE 1 TABLET BY MOUTH EVERY  MORNING BEFORE BREAKFAST (Patient not taking: Reported on 12/28/2022)   No facility-administered encounter medications on file as of 12/28/2022.    Allergies (verified) Bee venom and Fentanyl   History: Past Medical History:  Diagnosis Date   Anemia    h/o - ? bld. transfusion - more recent- ?2013   Anxiety    uses xanax mainly for  sleep   Arthritis    knees    Basal cell carcinoma 01/11/2005   rim left nostril-,mohs   BCC (basal cell carcinoma) 01/11/2005   right cheek mid-mohs   BCC (basal cell carcinoma) 09/12/2006   right forehead -mohs   BCC (basal cell carcinoma) 01/19/2016   right upper arm -[id number]   BCC (basal cell carcinoma) 02/18/2019   forehead-mohs   Breast cancer (HCC) 11/2011   lul/ER+PR+, x1 lymph node    Cancer (HCC)    squamous cell skin cancer x7   Complication of anesthesia    Depression    GERD (gastroesophageal reflux disease)    H/O bone density study 03/2011   H/O colonoscopy    H/O echocardiogram    last one 02/2013, due to chemotherapy   Heart burn    Hx  of radiation therapy 04/17/12- 06/04/12   left chest wall, axilla, L supraclavicular fossa 4500 cGy, left mastectomy scar/chest wall 5940 cGy   Hypertension    Hypothyroidism    IBS (irritable bowel syndrome)    Meningioma (HCC) 08/2020   Multiple sclerosis (HCC)    Neuromuscular disorder (HCC)    MS TX WITH CYMBALTA    Personal history of chemotherapy    Personal history of radiation therapy    SCC (squamous cell carcinoma) 01/11/2005   chest mid   Sleep apnea    no CPAP   Squamous cell carcinoma of skin 01/11/2005   over left lip   Wears glasses    Past Surgical History:  Procedure Laterality Date   APPLICATION OF ROBOTIC ASSISTANCE FOR SPINAL PROCEDURE N/A 03/12/2019   Procedure: APPLICATION OF ROBOTIC ASSISTANCE FOR SPINAL PROCEDURE;  Surgeon: Jadene Pierini, MD;  Location: [facility] OR;  Service: Neurosurgery;  Laterality: N/A;   BREAST REDUCTION SURGERY Right 01/25/2021   Procedure: MAMMARY REDUCTION  (BREAST);  Surgeon: Glenna Fellows, MD;  Location: [facility];  Service: Plastics;  Laterality: Right;   CESAREAN SECTION     X2    COLONOSCOPY     ESOPHAGOGASTRODUODENOSCOPY     FLEXIBLE SIGMOIDOSCOPY     I & D KNEE WITH POLY EXCHANGE  03/02/2012   Procedure: IRRIGATION AND DEBRIDEMENT KNEE WITH POLY EXCHANGE;  Surgeon: Harvie Junior, MD;  Location: [facility] OR;  Service: Orthopedics;  Laterality: Left;  I&D of total knee with Possible Poly Exchange    JOINT REPLACEMENT  07/22/2011   left knee- multiple surgeries    KNEE ARTHROSCOPY     LT KNEE 04/2011   KNEE ARTHROSCOPY  06/20/1982   rt   MASTECTOMY     MASTECTOMY W/ SENTINEL NODE BIOPSY  12/19/2011   Procedure: MASTECTOMY WITH SENTINEL LYMPH NODE BIOPSY;  Surgeon: Currie Paris, MD;  Location: [facility];  Service: General;  Laterality: Left;  left breast and left axilla    MOHS SURGERY     right face   PORT-A-CATH REMOVAL  03/06/2012   Procedure: REMOVAL PORT-A-CATH;  Surgeon: Adolph Pollack, MD;   Location: [facility] OR;  Service: General;  Laterality: N/A;   PORT-A-CATH REMOVAL Right 08/22/2012   Procedure: REMOVAL PORT-A-CATH;  Surgeon: Mariella Saa, MD;  Location: [facility];  Service: General;  Laterality: Right;   PORTACATH PLACEMENT  01/16/2012   Procedure: INSERTION PORT-A-CATH;  Surgeon: Currie Paris, MD;  Location: [facility];  Service: General;  Laterality: Right;  Porta Cath Placement    PORTACATH PLACEMENT  05/01/2012   Procedure: INSERTION PORT-A-CATH;  Surgeon: Reola Mosher  Streck, MD;  Location: [facility];  Service: General;  Laterality: Right;  right internal jugular port-a-cath insertion   TEE WITHOUT CARDIOVERSION  03/06/2012   Procedure: TRANSESOPHAGEAL ECHOCARDIOGRAM (TEE);  Surgeon: Wendall Stade, MD;  Location[facility];  Service: Cardiovascular;  Laterality: N/A;  Rm. [id number]   TONSILLECTOMY     TOTAL KNEE ARTHROPLASTY  08/15/2011   Procedure: TOTAL KNEE ARTHROPLASTY; lft Surgeon: Harvie Junior, MD;  Location: [facility];  Service: Orthopedics;  Laterality: Left;  RIGHT KNEE CORTIZONE INJECTION   TOTAL KNEE ARTHROPLASTY Left 08/18/2012   Procedure:  Irrigation and debridement of LEFT total knee;  Removal of total knee parts; Implant of spacers;  Surgeon: Nestor Lewandowsky, MD;  Location: [facility] OR;  Service: Orthopedics;  Laterality: Left;   TOTAL KNEE ARTHROPLASTY Right 08/12/2013   TOTAL KNEE ARTHROPLASTY Right 08/12/2013   Procedure: RIGHT TOTAL KNEE ARTHROPLASTY;  Surgeon: Nestor Lewandowsky, MD;  Location: [facility];  Service: Orthopedics;  Laterality: Right;   TOTAL KNEE REVISION Left 04/19/2013   Procedure: TOTAL KNEE REVISION AND REMOVAL CEMENT SPACER;  Surgeon: Nestor Lewandowsky, MD;  Location: [facility];  Service: Orthopedics;  Laterality: Left;   TRANSFORAMINAL LUMBAR INTERBODY FUSION (TLIF) WITH PEDICLE SCREW FIXATION 1 LEVEL Bilateral 03/12/2019   Procedure: Lumbar four-five Open transforaminal lumbar interbody fusion with Lumbar four-five  laminectomy, bilateral Lumbar four-five Pedicle screw placement;  Surgeon: Jadene Pierini, MD;  Location: [facility];  Service: Neurosurgery;  Laterality: Bilateral;   TUMOR REMOVAL     FROM PELVIS AGE 30   Family History  Problem Relation Age of Onset   Arthritis Mother    Hypertension Mother    Heart disease Mother        CHF   Dementia Father    Diabetes type II Father    Heart disease Maternal Grandfather    Breast cancer Paternal Grandmother        dx in her 58s   Lung cancer Maternal Uncle        smoker   Breast cancer Paternal Aunt        dx in her 45s   Ovarian cancer Paternal Aunt        dx in her 30s   Colon cancer Neg Hx    Stomach cancer Neg Hx    Esophageal cancer Neg Hx    Social History   Socioeconomic History   Marital status: Married    Spouse name: Not on file   Number of children: 2   Years of education: Not on file   Highest education level: Not on file  Occupational History    Employer: [employer]    Occupation: disabled  Tobacco Use   Smoking status: Never   Smokeless tobacco: Never  Vaping Use   Vaping Use: Never used  Substance and Sexual Activity   Alcohol use: No   Drug use: No   Sexual activity: Yes    Birth control/protection: Post-menopausal  Other Topics Concern   Not on file  Social History Narrative   Right Handed   1 cup of Tea per Day   Social Determinants of Health   Financial Resource Strain: Low Risk  (12/28/2022)   Overall Financial Resource Strain (CARDIA)    Difficulty of Paying Living Expenses: Not hard at all  Food Insecurity: No Food Insecurity (12/28/2022)   Hunger Vital Sign    Worried About Running Out of Food in the Last Year: Never true  Ran Out of Food in the Last Year: Never true  Transportation Needs: No Transportation Needs (12/28/2022)   PRAPARE - Administrator, Civil Service (Medical): No    Lack of Transportation (Non-Medical): No  Physical Activity: Insufficiently Active  (12/28/2022)   Exercise Vital Sign    Days of Exercise per Week: 2 days    Minutes of Exercise per Session: 40 min  Stress: No Stress Concern Present (12/28/2022)   [employer] of Occupational Health - Occupational Stress Questionnaire    Feeling of Stress : Not at all  Social Connections: Moderately Integrated (12/28/2022)   Social Connection and Isolation Panel [NHANES]    Frequency of Communication with Friends and Family: More than three times a week    Frequency of Social Gatherings with Friends and Family: More than three times a week    Attends Religious Services: Never    Database administrator or Organizations: Yes    Attends Engineer, structural: More than 4 times per year    Marital Status: Married    Tobacco Counseling Counseling given: Not Answered   Clinical Intake:  Pre-visit preparation completed: Yes  Pain : No/denies pain     Nutritional Risks: None Diabetes: No  How often do you need to have someone help you when you read instructions, pamphlets, or other written materials from your doctor or pharmacy?: 1 - Never  Interpreter Needed?: No  Information entered by :: Remi Haggard LPN   Activities of Daily Living    12/28/2022    2:06 PM 12/24/2022    8:46 AM  In your present state of health, do you have any difficulty performing the following activities:  Hearing? 0 0  Vision? 0 0  Difficulty concentrating or making decisions? 0 0  Walking or climbing stairs? 0 0  Dressing or bathing? 0 0  Doing errands, shopping? 0 0  Preparing Food and eating ? N N  Using the Toilet? N N  In the past six months, have you accidently leaked urine? N N  Do you have problems with loss of bowel control? N N  Managing your Medications? N N  Managing your Finances? N N  Housekeeping or managing your Housekeeping? N N    Patient Care Team: Sheliah Hatch, MD as PCP - General (Family Medicine) Rennis Golden Lisette Abu, MD as PCP - Cardiology  (Cardiology) Julio Sicks, NP as Nurse Practitioner (Obstetrics and Gynecology) Iva Boop, MD as Consulting Physician (Gastroenterology) Gean Birchwood, MD as Consulting Physician (Orthopedic Surgery) Janalyn Harder, MD (Inactive) as Consulting Physician (Dermatology) Epimenio Foot, Pearletha Furl, MD (Neurology)  Indicate any recent Medical Services you may have received from other than [facility] providers in the past year (date may be approximate).     Assessment:   This is a routine wellness examination for Royce.  Hearing/Vision screen Hearing Screening - Comments:: No trouble hearing Vision Screening - Comments:: Up to date [facility]  Dietary issues and exercise activities discussed:     Goals Addressed             This Visit's Progress    Weight (lb) < 200 lb (90.7 kg)         Depression Screen    12/28/2022    2:13 PM 10/10/2022   11:00 AM 04/04/2022    1:00 PM 10/06/2021    1:57 PM 03/31/2021    1:59 PM 07/14/2020   10:01 AM 08/30/2019    2:00 PM  PHQ 2/9 Scores  PHQ - 2 Score 2 3 2 2 2  0 0  PHQ- 9 Score 8 9 10 11 10 3      Fall Risk    12/28/2022    2:06 PM 12/24/2022    8:46 AM 12/19/2022    9:35 AM 10/10/2022   11:01 AM 04/04/2022    1:01 PM  Fall Risk   Falls in the past year? 0 0 0 0 1  Number falls in past yr: 0   0 0  Injury with Fall? 0 0 0 0 0  Risk for fall due to :    No Fall Risks History of fall(s)  Follow up Falls evaluation completed;Education provided;Falls prevention discussed   Falls evaluation completed Falls evaluation completed    MEDICARE RISK AT HOME:  Medicare Risk at Home - 12/28/22 1409     Any stairs in or around the home? Yes    If so, are there any without handrails? No    Home free of loose throw rugs in walkways, pet beds, electrical cords, etc? Yes    Adequate lighting in your home to reduce risk of falls? Yes    Life alert? No    Use of a cane, walker or w/c? No    Grab bars in the bathroom? Yes    Shower chair or  bench in shower? Yes    Elevated toilet seat or a handicapped toilet? Yes             TIMED UP AND GO:  Was the test performed?  No    Cognitive Function:    05/28/2020    2:08 PM  MMSE - Mini Mental State Exam  Orientation to time 4  Orientation to Place 5  Registration 3  Attention/ Calculation 4  Recall 3  Language- name 2 objects 2  Language- repeat 1  Language- follow 3 step command 3  Language- read & follow direction 1  Write a sentence 1  Copy design 1  Total score 28      07/06/2015    1:38 PM  Montreal Cognitive Assessment   Visuospatial/ Executive (0/5) 5  Naming (0/3) 3  Attention: Read list of digits (0/2) 2  Attention: Read list of letters (0/1) 1  Attention: Serial 7 subtraction starting at 100 (0/3) 3  Language: Repeat phrase (0/2) 2  Language : Fluency (0/1) 1  Abstraction (0/2) 2  Delayed Recall (0/5) 2  Orientation (0/6) 6  Total 27      12/28/2022    2:10 PM  6CIT Screen  What Year? 0 points  What month? 0 points  What time? 0 points  Count back from 20 0 points  Months in reverse 0 points  Repeat phrase 0 points  Total Score 0 points    Immunizations Immunization History  Administered Date(s) Administered   Influenza Split 04/11/2012   Influenza,inj,Quad PF,6+ Mos 02/27/2013, 03/20/2014, 04/25/2016, 04/02/2018, 02/14/2019, 03/31/2021, 04/04/2022   Influenza-Unspecified 03/30/2015, 03/27/2017, 02/14/2019, 02/28/2020   PFIZER(Purple Top)SARS-COV-2 Vaccination 09/23/2019, 04/14/2020   PPD Test 05/09/2012   Pneumococcal Polysaccharide-23 06/20/2010   Tdap 06/21/2011, 08/21/2018   Unspecified SARS-COV-2 Vaccination 09/02/2019   Zoster Recombinant(Shingrix) 03/31/2021, 07/30/2021    TDAP status: Up to date  Flu Vaccine status: Up to date  Pneumococcal vaccine status: Up to date  Covid-19 vaccine status: Information provided on how to obtain vaccines.   Qualifies for Shingles Vaccine? No   Zostavax completed No   Shingrix  Completed?: Yes  Screening Tests Health Maintenance  Topic Date Due   INFLUENZA VACCINE  01/19/2023   MAMMOGRAM  11/30/2023   PAP SMEAR-Modifier  12/02/2023   Medicare Annual Wellness (AWV)  12/28/2023   DTaP/Tdap/Td (3 - Td or Tdap) 08/20/2028   Colonoscopy  11/12/2031   Hepatitis C Screening  Completed   Zoster Vaccines- Shingrix  Completed   HPV VACCINES  Aged Out   COVID-19 Vaccine  Discontinued   HIV Screening  Discontinued    Health Maintenance  There are no preventive care reminders to display for this patient.   Colorectal cancer screening: Type of screening: Colonoscopy. Completed 2023. Repeat every 10 years  Mammogram status: Completed  . Repeat every year  Bone Density status: Completed  . Results reflect: Bone density results: NORMAL. Repeat every 2 years.  Lung Cancer Screening: (Low Dose CT Chest recommended if Age 54-80 years, 20 pack-year currently smoking OR have quit w/in 15years.) does not qualify.   Lung Cancer Screening Referral:   Additional Screening:  Hepatitis C Screening: does not qualify; Completed 2017  Vision Screening: Recommended annual ophthalmology exams for early detection of glaucoma and other disorders of the eye. Is the patient up to date with their annual eye exam?  Yes  Who is the provider or what is the name of the office in which the patient attends annual eye exams? [facility] If pt is not established with a provider, would they like to be referred to a provider to establish care? No .   Dental Screening: Recommended annual dental exams for proper oral hygiene     Community Resource Referral / Chronic Care Management: CRR required this visit?  No   CCM required this visit?  No     Plan:     I have personally reviewed and noted the following in the patient's chart:   Medical and social history Use of alcohol, tobacco or illicit drugs  Current medications and supplements including opioid prescriptions. Patient is not  currently taking opioid prescriptions. Functional ability and status Nutritional status Physical activity Advanced directives List of other physicians Hospitalizations, surgeries, and ER visits in previous 12 months Vitals Screenings to include cognitive, depression, and falls Referrals and appointments  In addition, I have reviewed and discussed with patient certain preventive protocols, quality metrics, and best practice recommendations. A written personalized care plan for preventive services as well as general preventive health recommendations were provided to patient.     Remi Haggard, LPN   1/61/0960   After Visit Summary: (MyChart) Due to this being a telephonic visit, the after visit summary with patients personalized plan was offered to patient via MyChart   Nurse Notes:

## 2022-12-28 NOTE — Patient Instructions (Signed)
[name] , Thank you for taking time to come for your Medicare Wellness Visit. I appreciate your ongoing commitment to your health goals. Please review the following plan we discussed and let me know if I can assist you in the future.   Screening recommendations/referrals: Colonoscopy: up to date Mammogram: up to date Bone Density: up to date Recommended yearly ophthalmology/optometry visit for glaucoma screening and checkup Recommended yearly dental visit for hygiene and checkup  Vaccinations: Influenza vaccine: up to date Pneumococcal vaccine: up to date Tdap vaccine: up to date Shingles vaccine: up to date    Advanced directives: yes  not on file    Preventive Care 65 Years and Older, Female Preventive care refers to lifestyle choices and visits with your health care provider that can promote health and wellness. What does preventive care include? A yearly physical exam. This is also called an annual well check. Dental exams once or twice a year. Routine eye exams. Ask your health care provider how often you should have your eyes checked. Personal lifestyle choices, including: Daily care of your teeth and gums. Regular physical activity. Eating a healthy diet. Avoiding tobacco and drug use. Limiting alcohol use. Practicing safe sex. Taking low-dose aspirin every day. Taking vitamin and mineral supplements as recommended by your health care provider. What happens during an annual well check? The services and screenings done by your health care provider during your annual well check will depend on your age, overall health, lifestyle risk factors, and family history of disease. Counseling  Your health care provider may ask you questions about your: Alcohol use. Tobacco use. Drug use. Emotional well-being. Home and relationship well-being. Sexual activity. Eating habits. History of falls. Memory and ability to understand (cognition). Work and work  Astronomer. Reproductive health. Screening  You may have the following tests or measurements: Height, weight, and BMI. Blood pressure. Lipid and cholesterol levels. These may be checked every 5 years, or more frequently if you are over 46 years old. Skin check. Lung cancer screening. You may have this screening every year starting at age 68 if you have a 30-pack-year history of smoking and currently smoke or have quit within the past 15 years. Fecal occult blood test (FOBT) of the stool. You may have this test every year starting at age 21. Flexible sigmoidoscopy or colonoscopy. You may have a sigmoidoscopy every 5 years or a colonoscopy every 10 years starting at age 44. Hepatitis C blood test. Hepatitis B blood test. Sexually transmitted disease (STD) testing. Diabetes screening. This is done by checking your blood sugar (glucose) after you have not eaten for a while (fasting). You may have this done every 1-3 years. Bone density scan. This is done to screen for osteoporosis. You may have this done starting at age 46. Mammogram. This may be done every 1-2 years. Talk to your health care provider about how often you should have regular mammograms. Talk with your health care provider about your test results, treatment options, and if necessary, the need for more tests. Vaccines  Your health care provider may recommend certain vaccines, such as: Influenza vaccine. This is recommended every year. Tetanus, diphtheria, and acellular pertussis (Tdap, Td) vaccine. You may need a Td booster every 10 years. Zoster vaccine. You may need this after age 18. Pneumococcal 13-valent conjugate (PCV13) vaccine. One dose is recommended after age 7. Pneumococcal polysaccharide (PPSV23) vaccine. One dose is recommended after age 30. Talk to your health care provider about which screenings and vaccines you  need and how often you need them. This information is not intended to replace advice given to you by  your health care provider. Make sure you discuss any questions you have with your health care provider. Document Released: 07/03/2015 Document Revised: 02/24/2016 Document Reviewed: 04/07/2015 Elsevier Interactive Patient Education  2017 [employer].  Fall Prevention in the Home Falls can cause injuries. They can happen to people of all ages. There are many things you can do to make your home safe and to help prevent falls. What can I do on the outside of my home? Regularly fix the edges of walkways and driveways and fix any cracks. Remove anything that might make you trip as you walk through a door, such as a raised step or threshold. Trim any bushes or trees on the path to your home. Use bright outdoor lighting. Clear any walking paths of anything that might make someone trip, such as rocks or tools. Regularly check to see if handrails are loose or broken. Make sure that both sides of any steps have handrails. Any raised decks and porches should have guardrails on the edges. Have any leaves, snow, or ice cleared regularly. Use sand or salt on walking paths during winter. Clean up any spills in your garage right away. This includes oil or grease spills. What can I do in the bathroom? Use night lights. Install grab bars by the toilet and in the tub and shower. Do not use towel bars as grab bars. Use non-skid mats or decals in the tub or shower. If you need to sit down in the shower, use a plastic, non-slip stool. Keep the floor dry. Clean up any water that spills on the floor as soon as it happens. Remove soap buildup in the tub or shower regularly. Attach bath mats securely with double-sided non-slip rug tape. Do not have throw rugs and other things on the floor that can make you trip. What can I do in the bedroom? Use night lights. Make sure that you have a light by your bed that is easy to reach. Do not use any sheets or blankets that are too big for your bed. They should not hang  down onto the floor. Have a firm chair that has side arms. You can use this for support while you get dressed. Do not have throw rugs and other things on the floor that can make you trip. What can I do in the kitchen? Clean up any spills right away. Avoid walking on wet floors. Keep items that you use a lot in easy-to-reach places. If you need to reach something above you, use a strong step stool that has a grab bar. Keep electrical cords out of the way. Do not use floor polish or wax that makes floors slippery. If you must use wax, use non-skid floor wax. Do not have throw rugs and other things on the floor that can make you trip. What can I do with my stairs? Do not leave any items on the stairs. Make sure that there are handrails on both sides of the stairs and use them. Fix handrails that are broken or loose. Make sure that handrails are as long as the stairways. Check any carpeting to make sure that it is firmly attached to the stairs. Fix any carpet that is loose or worn. Avoid having throw rugs at the top or bottom of the stairs. If you do have throw rugs, attach them to the floor with carpet tape. Make sure that  you have a light switch at the top of the stairs and the bottom of the stairs. If you do not have them, ask someone to add them for you. What else can I do to help prevent falls? Wear shoes that: Do not have high heels. Have rubber bottoms. Are comfortable and fit you well. Are closed at the toe. Do not wear sandals. If you use a stepladder: Make sure that it is fully opened. Do not climb a closed stepladder. Make sure that both sides of the stepladder are locked into place. Ask someone to hold it for you, if possible. Clearly mark and make sure that you can see: Any grab bars or handrails. First and last steps. Where the edge of each step is. Use tools that help you move around (mobility aids) if they are needed. These  include: Canes. Walkers. Scooters. Crutches. Turn on the lights when you go into a dark area. Replace any light bulbs as soon as they burn out. Set up your furniture so you have a clear path. Avoid moving your furniture around. If any of your floors are uneven, fix them. If there are any pets around you, be aware of where they are. Review your medicines with your doctor. Some medicines can make you feel dizzy. This can increase your chance of falling. Ask your doctor what other things that you can do to help prevent falls. This information is not intended to replace advice given to you by your health care provider. Make sure you discuss any questions you have with your health care provider. Document Released: 04/02/2009 Document Revised: 11/12/2015 Document Reviewed: 07/11/2014 Elsevier Interactive Patient Education  2017 [employer].

## 2023-01-12 ENCOUNTER — Other Ambulatory Visit: Payer: Self-pay | Admitting: Neurology

## 2023-01-16 NOTE — Telephone Encounter (Signed)
Last seen on 12/01/22 Follow up scheduled on 06/28/23

## 2023-01-22 ENCOUNTER — Other Ambulatory Visit: Payer: Self-pay | Admitting: Family Medicine

## 2023-02-08 DIAGNOSIS — L814 Other melanin hyperpigmentation: Secondary | ICD-10-CM | POA: Diagnosis not present

## 2023-02-08 DIAGNOSIS — C44619 Basal cell carcinoma of skin of left upper limb, including shoulder: Secondary | ICD-10-CM | POA: Diagnosis not present

## 2023-02-08 DIAGNOSIS — Z08 Encounter for follow-up examination after completed treatment for malignant neoplasm: Secondary | ICD-10-CM | POA: Diagnosis not present

## 2023-02-08 DIAGNOSIS — Z85828 Personal history of other malignant neoplasm of skin: Secondary | ICD-10-CM | POA: Diagnosis not present

## 2023-02-08 DIAGNOSIS — L570 Actinic keratosis: Secondary | ICD-10-CM | POA: Diagnosis not present

## 2023-02-08 DIAGNOSIS — D485 Neoplasm of uncertain behavior of skin: Secondary | ICD-10-CM | POA: Diagnosis not present

## 2023-02-08 DIAGNOSIS — L821 Other seborrheic keratosis: Secondary | ICD-10-CM | POA: Diagnosis not present

## 2023-02-08 DIAGNOSIS — Z7189 Other specified counseling: Secondary | ICD-10-CM | POA: Diagnosis not present

## 2023-02-08 DIAGNOSIS — C44519 Basal cell carcinoma of skin of other part of trunk: Secondary | ICD-10-CM | POA: Diagnosis not present

## 2023-02-08 DIAGNOSIS — D225 Melanocytic nevi of trunk: Secondary | ICD-10-CM | POA: Diagnosis not present

## 2023-02-14 DIAGNOSIS — C44619 Basal cell carcinoma of skin of left upper limb, including shoulder: Secondary | ICD-10-CM | POA: Diagnosis not present

## 2023-02-14 DIAGNOSIS — L989 Disorder of the skin and subcutaneous tissue, unspecified: Secondary | ICD-10-CM | POA: Diagnosis not present

## 2023-02-17 ENCOUNTER — Encounter: Payer: Self-pay | Admitting: Internal Medicine

## 2023-02-23 ENCOUNTER — Other Ambulatory Visit: Payer: Self-pay | Admitting: Neurology

## 2023-02-23 ENCOUNTER — Other Ambulatory Visit: Payer: Self-pay | Admitting: Family Medicine

## 2023-02-23 NOTE — Telephone Encounter (Signed)
Last seen on 12/01/22 Follow up scheduled on 06/28/23 Last filled on 01/03/23 #90 tablets (90 day supply) Rx pending to be signed

## 2023-02-27 DIAGNOSIS — Z48817 Encounter for surgical aftercare following surgery on the skin and subcutaneous tissue: Secondary | ICD-10-CM | POA: Diagnosis not present

## 2023-03-07 ENCOUNTER — Encounter (HOSPITAL_COMMUNITY): Payer: Self-pay

## 2023-03-07 ENCOUNTER — Emergency Department (HOSPITAL_COMMUNITY): Payer: Medicare Other

## 2023-03-07 ENCOUNTER — Other Ambulatory Visit: Payer: Self-pay

## 2023-03-07 ENCOUNTER — Emergency Department (HOSPITAL_COMMUNITY)
Admission: EM | Admit: 2023-03-07 | Discharge: 2023-03-07 | Disposition: A | Discharge status: Home / Self Care | Payer: Medicare Other | Attending: Emergency Medicine | Admitting: Emergency Medicine

## 2023-03-07 DIAGNOSIS — R110 Nausea: Secondary | ICD-10-CM | POA: Diagnosis not present

## 2023-03-07 DIAGNOSIS — W010XXA Fall on same level from slipping, tripping and stumbling without subsequent striking against object, initial encounter: Secondary | ICD-10-CM | POA: Insufficient documentation

## 2023-03-07 DIAGNOSIS — S0181XA Laceration without foreign body of other part of head, initial encounter: Secondary | ICD-10-CM

## 2023-03-07 DIAGNOSIS — S01111A Laceration without foreign body of right eyelid and periocular area, initial encounter: Secondary | ICD-10-CM | POA: Diagnosis not present

## 2023-03-07 DIAGNOSIS — E039 Hypothyroidism, unspecified: Secondary | ICD-10-CM | POA: Insufficient documentation

## 2023-03-07 DIAGNOSIS — Z23 Encounter for immunization: Secondary | ICD-10-CM | POA: Diagnosis not present

## 2023-03-07 DIAGNOSIS — R404 Transient alteration of awareness: Secondary | ICD-10-CM | POA: Diagnosis not present

## 2023-03-07 DIAGNOSIS — I499 Cardiac arrhythmia, unspecified: Secondary | ICD-10-CM | POA: Diagnosis not present

## 2023-03-07 DIAGNOSIS — Z7982 Long term (current) use of aspirin: Secondary | ICD-10-CM | POA: Insufficient documentation

## 2023-03-07 DIAGNOSIS — I10 Essential (primary) hypertension: Secondary | ICD-10-CM | POA: Insufficient documentation

## 2023-03-07 DIAGNOSIS — Z743 Need for continuous supervision: Secondary | ICD-10-CM | POA: Diagnosis not present

## 2023-03-07 DIAGNOSIS — S0083XA Contusion of other part of head, initial encounter: Secondary | ICD-10-CM

## 2023-03-07 DIAGNOSIS — Z79899 Other long term (current) drug therapy: Secondary | ICD-10-CM | POA: Insufficient documentation

## 2023-03-07 DIAGNOSIS — W19XXXA Unspecified fall, initial encounter: Secondary | ICD-10-CM

## 2023-03-07 DIAGNOSIS — S0990XA Unspecified injury of head, initial encounter: Secondary | ICD-10-CM | POA: Diagnosis not present

## 2023-03-07 DIAGNOSIS — R220 Localized swelling, mass and lump, head: Secondary | ICD-10-CM | POA: Diagnosis not present

## 2023-03-07 DIAGNOSIS — S0510XA Contusion of eyeball and orbital tissues, unspecified eye, initial encounter: Secondary | ICD-10-CM | POA: Diagnosis not present

## 2023-03-07 MED ORDER — LIDOCAINE-EPINEPHRINE-TETRACAINE (LET) TOPICAL GEL
3.0000 mL | Freq: Once | TOPICAL | Status: AC
  Administered 2023-03-07: 3 mL via TOPICAL
  Filled 2023-03-07: qty 3

## 2023-03-07 MED ORDER — TETANUS-DIPHTH-ACELL PERTUSSIS 5-2.5-18.5 LF-MCG/0.5 IM SUSY
0.5000 mL | PREFILLED_SYRINGE | Freq: Once | INTRAMUSCULAR | Status: AC
  Administered 2023-03-07: 0.5 mL via INTRAMUSCULAR
  Filled 2023-03-07: qty 0.5

## 2023-03-07 MED ORDER — ACETAMINOPHEN 500 MG PO TABS
1000.0000 mg | ORAL_TABLET | Freq: Once | ORAL | Status: AC
  Administered 2023-03-07: 1000 mg via ORAL
  Filled 2023-03-07: qty 2

## 2023-03-07 NOTE — Discharge Instructions (Addendum)
You can use ice intermittently for the swelling on your face but you will have significant bruising for probably at least 2 weeks before it is completely gone.  Make sure you get up carefully and walk with care because one of your eyes is swollen shut.  The CAT scans today all looked normal.

## 2023-03-07 NOTE — ED Provider Notes (Signed)
[place] EMERGENCY DEPARTMENT AT [facility] Provider Note   CSN: [id number] Arrival date & time: 03/07/23  1416     History  No chief complaint on file.   Julie Ross is a 62 y.o. female.  Patient is a 62 year old female with a history of hypertension, hypothyroid, MS, anemia who is presenting today after a fall.  Patient was walking into a restaurant and the floor was wet causing her to slip and fall forward hitting her face on the floor.  She denies any loss of consciousness.  She has no neck pain but does have significant facial pain and headache.  No vision changes.  No numbness tingling in the arms or legs or difficulty walking.  She does not take any anticoagulation.  Only injuries to the face  The history is provided by the patient.       Home Medications Prior to Admission medications   Medication Sig Start Date End Date Taking? Authorizing Provider  Ascorbic Acid (VITAMIN C PO) Take by mouth.    [provider]  aspirin EC 81 MG tablet Take 1 tablet (81 mg total) by mouth daily. Swallow whole. 06/29/21   Alver Sorrow, NP  atorvastatin (LIPITOR) 20 MG tablet TAKE 1 TABLET BY MOUTH ONCE  DAILY 12/14/22   Sheliah Hatch, MD  baclofen (LIORESAL) 10 MG tablet TAKE 1 TABLET BY MOUTH 3 TIMES  DAILY AS NEEDED FOR MUSCLE  SPASM(S) 09/15/22   Asa Lente, MD  buPROPion (WELLBUTRIN XL) 150 MG 24 hr tablet TAKE 1 TABLET BY MOUTH DAILY 02/23/23   Sheliah Hatch, MD  clonazePAM (KLONOPIN) 1 MG tablet TAKE 1 TABLET BY MOUTH AT  BEDTIME 02/23/23   Sater, Pearletha Furl, MD  EPINEPHrine 0.3 mg/0.3 mL IJ SOAJ injection Inject 0.3 mg into the muscle as needed for anaphylaxis. 10/10/22   Sheliah Hatch, MD  gabapentin (NEURONTIN) 600 MG tablet TAKE 1 TABLET BY MOUTH 3 TIMES  DAILY 01/16/23   Sater, Pearletha Furl, MD  HYDROcodone-acetaminophen (NORCO/VICODIN) 5-325 MG tablet  08/12/21   [provider]  levothyroxine (SYNTHROID) 88 MCG tablet TAKE 1 TABLET  BY MOUTH DAILY  BEFORE BREAKFAST 12/12/22   Sheliah Hatch, MD  losartan-hydrochlorothiazide (HYZAAR) 100-25 MG tablet TAKE 1 TABLET BY MOUTH EVERY  MORNING BEFORE BREAKFAST Patient not taking: Reported on 12/28/2022 12/12/22   Sheliah Hatch, MD  omeprazole (PRILOSEC) 40 MG capsule TAKE 1 CAPSULE BY MOUTH DAILY 12/14/22   Sheliah Hatch, MD  ondansetron (ZOFRAN) 4 MG tablet Take 1 tablet (4 mg total) by mouth every 8 (eight) hours as needed for nausea or vomiting. 09/02/22   Sheliah Hatch, MD  potassium chloride SA (KLOR-CON M) 20 MEQ tablet TAKE 1 TABLET BY MOUTH DAILY 12/14/22   Sheliah Hatch, MD  sertraline (ZOLOFT) 100 MG tablet TAKE 1 TABLET BY MOUTH DAILY 01/23/23   Sheliah Hatch, MD      Allergies    Bee venom and Fentanyl    Review of Systems   Review of Systems  Physical Exam Updated Vital Signs BP (!) 170/78   Pulse 63   Temp 97.9 F (36.6 C) (Oral)   Resp 16   LMP 12/05/2011   SpO2 98%  Physical Exam Vitals and nursing note reviewed.  Constitutional:      General: She is not in acute distress.    Appearance: She is well-developed.  HENT:     Head: Normocephalic.  Comments: 2 small lacerations proximal to the right eyebrow.  The lateral 1 is 0.5 cm and the other 1 is 1 cm.  Significant hematoma to the same area.  Significant ecchymosis to the supraorbital area Eyes:     Extraocular Movements: Extraocular movements intact.     Conjunctiva/sclera: Conjunctivae normal.     Pupils: Pupils are equal, round, and reactive to light.  Cardiovascular:     Rate and Rhythm: Normal rate and regular rhythm.     Heart sounds: No murmur heard. Pulmonary:     Effort: Pulmonary effort is normal. No respiratory distress.     Breath sounds: Normal breath sounds. No wheezing or rales.  Abdominal:     General: There is no distension.     Palpations: Abdomen is soft.     Tenderness: There is no abdominal tenderness. There is no guarding or rebound.   Musculoskeletal:        General: No tenderness. Normal range of motion.     Cervical back: Normal range of motion and neck supple.     Right lower leg: No edema.     Left lower leg: No edema.  Skin:    General: Skin is warm and dry.     Findings: No erythema or rash.  Neurological:     Mental Status: She is alert and oriented to person, place, and time.  Psychiatric:        Behavior: Behavior normal.     ED Results / Procedures / Treatments   Labs (all labs ordered are listed, but only abnormal results are displayed) Labs Reviewed - No data to display  EKG None  Radiology CT Maxillofacial Wo Contrast  Result Date: 03/07/2023 CLINICAL DATA:  Head trauma, moderate to severe. EXAM: CT HEAD WITHOUT CONTRAST CT MAXILLOFACIAL WITHOUT CONTRAST TECHNIQUE: Multidetector CT imaging of the head and maxillofacial structures were performed using the standard protocol without intravenous contrast. Multiplanar CT image reconstructions of the maxillofacial structures were also generated. RADIATION DOSE REDUCTION: This exam was performed according to the departmental dose-optimization program which includes automated exposure control, adjustment of the mA and/or kV according to patient size and/or use of iterative reconstruction technique. COMPARISON:  Brain MRI 03/18/2022 FINDINGS: CT HEAD FINDINGS Brain: No evidence of acute infarction, hemorrhage, hydrocephalus, extra-axial collection or mass effect. Two known meningiomas along the left tentorium and left sphenoid wing, recently characterized by MRI. Vascular: No hyperdense vessel or unexpected calcification. Skull: No calvarial fracture CT MAXILLOFACIAL FINDINGS Osseous: No fracture or mandibular dislocation. No destructive process. Orbits: Prominent soft tissue contusion over the preseptal right orbit. No opaque foreign body Sinuses: Negative for hemosinus Soft tissues: Soft tissue swelling to the right face with gas from laceration. IMPRESSION: 1.  No evidence of intracranial injury. 2. Right facial swelling without acute fracture or evidence of postseptal injury. Electronically Signed   By: Tiburcio Pea M.D.   On: 03/07/2023 18:16   CT Head Wo Contrast  Result Date: 03/07/2023 CLINICAL DATA:  Head trauma, moderate to severe. EXAM: CT HEAD WITHOUT CONTRAST CT MAXILLOFACIAL WITHOUT CONTRAST TECHNIQUE: Multidetector CT imaging of the head and maxillofacial structures were performed using the standard protocol without intravenous contrast. Multiplanar CT image reconstructions of the maxillofacial structures were also generated. RADIATION DOSE REDUCTION: This exam was performed according to the departmental dose-optimization program which includes automated exposure control, adjustment of the mA and/or kV according to patient size and/or use of iterative reconstruction technique. COMPARISON:  Brain MRI 03/18/2022 FINDINGS: CT HEAD FINDINGS Brain:  No evidence of acute infarction, hemorrhage, hydrocephalus, extra-axial collection or mass effect. Two known meningiomas along the left tentorium and left sphenoid wing, recently characterized by MRI. Vascular: No hyperdense vessel or unexpected calcification. Skull: No calvarial fracture CT MAXILLOFACIAL FINDINGS Osseous: No fracture or mandibular dislocation. No destructive process. Orbits: Prominent soft tissue contusion over the preseptal right orbit. No opaque foreign body Sinuses: Negative for hemosinus Soft tissues: Soft tissue swelling to the right face with gas from laceration. IMPRESSION: 1. No evidence of intracranial injury. 2. Right facial swelling without acute fracture or evidence of postseptal injury. Electronically Signed   By: Tiburcio Pea M.D.   On: 03/07/2023 18:16    Procedures Procedures   LACERATION REPAIR Performed by: [facility] Authorized by: Gwyneth Sprout Consent: Verbal consent obtained. Risks and benefits: risks, benefits and alternatives were discussed Consent  given by: patient Patient identity confirmed: provided demographic data Prepped and Draped in normal sterile fashion Wound explored  Laceration Location: right forehead  Laceration Length: 0.5 and 1.0cm  No Foreign Bodies seen or palpated  Anesthesia: topical  Local anesthetic:LET  cream Anesthetic total: 1 ml  Irrigation method: syringe Amount of cleaning: standard  Skin closure: 6.0 prolene  Number of sutures: 2 sutures in 0.5cm lac and 3 sutures in 1.0cm lac  Technique: simple interrupted  Patient tolerance: Patient tolerated the procedure well with no immediate complications.  Medications Ordered in ED Medications  lidocaine-EPINEPHrine-tetracaine (LET) topical gel (3 mLs Topical Given 03/07/23 1527)  Tdap (BOOSTRIX) injection 0.5 mL (0.5 mLs Intramuscular Given 03/07/23 1608)    ED Course/ Medical Decision Making/ A&P                                 Medical Decision Making Amount and/or Complexity of Data Reviewed Radiology: ordered and independent interpretation performed. Decision-making details documented in ED Course.  Risk Prescription drug management.   Pt with multiple medical problems and comorbidities and presenting today with a complaint that caries a high risk for morbidity and mortality.  Here today after a fall and injury to the right side of his face.  Lacerations were repaired as above.  Tetanus shot was updated today because she was sure it was more than 5 years ago.  Imaging of the head and face pending.  6:23 PM I have independently visualized and interpreted pt's images today.  CT of the head and face is negative for acute fracture or intracranial hemorrhage.  Radiology reports right facial swelling without acute fractures.  Findings discussed with the patient and her family.  She will need suture removal in 5 days.  No social determinants affecting her discharge today.  They are comfortable with this plan.        Final Clinical  Impression(s) / ED Diagnoses Final diagnoses:  Fall, initial encounter  Forehead laceration, initial encounter  Facial hematoma, initial encounter    Rx / DC Orders ED Discharge Orders     None         Gwyneth Sprout, MD 03/07/23 [phone]

## 2023-03-09 ENCOUNTER — Encounter: Payer: Self-pay | Admitting: Family Medicine

## 2023-03-09 DIAGNOSIS — R112 Nausea with vomiting, unspecified: Secondary | ICD-10-CM

## 2023-03-09 MED ORDER — ONDANSETRON HCL 4 MG PO TABS
4.0000 mg | ORAL_TABLET | Freq: Three times a day (TID) | ORAL | 0 refills | Status: DC | PRN
Start: 2023-03-09 — End: 2023-04-12

## 2023-03-09 NOTE — Telephone Encounter (Signed)
[facility] VISIT   Patient agreed to [facility] visit and is aware that copayment and coinsurance may apply. Patient was treated using telemedicine according to accepted telemedicine protocols.  Subjective:   Patient complains of nausea due to dizziness s/p fall  Patient Active Problem List   Diagnosis Date Noted   Vitamin D deficiency 10/10/2022   Cerebral meningioma (HCC) 11/27/2020   Genetic testing 09/11/2020   Obesity (BMI 30-39.9) 07/14/2020   Meningioma (HCC) 06/16/2020   Arthritis 11/04/2019   Insomnia 08/22/2019   Lumbar radiculopathy 03/12/2019   Spondylolisthesis at L4-L5 level 08/24/2018   Facet syndrome, lumbar 08/24/2018   Chronic SI joint pain 03/30/2018   Delayed sleep phase syndrome 03/30/2018   History of optic neuritis 07/19/2016   Hyperlipidemia 06/18/2015   H/O malignant neoplasm of breast 02/18/2015   Gonalgia 02/18/2015   Relapsing remitting multiple sclerosis (HCC) 02/18/2015   Physical exam 12/17/2014   Other fatigue 10/15/2014   Obstructive sleep apnea 10/15/2014   Restless leg syndrome 10/15/2014   Allergic rhinitis 09/30/2014   Memory loss 10/14/2013   Depression, recurrent (HCC) 08/30/2013   S/P total knee arthroplasty 08/23/2013   Hypothyroidism 05/29/2013   Esophageal reflux 05/29/2013   Hx of radiation therapy    Essential hypertension, benign 06/11/2012   Multiple sclerosis (HCC) 01/23/2012   Breast cancer of upper-outer quadrant of right female breast (HCC) 11/22/2011   Osteoarthritis of both knees 08/15/2011   Social History   Tobacco Use   Smoking status: Never   Smokeless tobacco: Never  Substance Use Topics   Alcohol use: No    Current Outpatient Medications:    Ascorbic Acid (VITAMIN C PO), Take by mouth., Disp: , Rfl:    aspirin EC 81 MG tablet, Take 1 tablet (81 mg total) by mouth daily. Swallow whole., Disp: 90 tablet, Rfl: 3   atorvastatin (LIPITOR) 20 MG tablet, TAKE 1 TABLET BY MOUTH ONCE  DAILY, Disp: 100 tablet, Rfl: 2    baclofen (LIORESAL) 10 MG tablet, TAKE 1 TABLET BY MOUTH 3 TIMES  DAILY AS NEEDED FOR MUSCLE  SPASM(S), Disp: 270 tablet, Rfl: 3   buPROPion (WELLBUTRIN XL) 150 MG 24 hr tablet, TAKE 1 TABLET BY MOUTH DAILY, Disp: 90 tablet, Rfl: 3   clonazePAM (KLONOPIN) 1 MG tablet, TAKE 1 TABLET BY MOUTH AT  BEDTIME, Disp: 90 tablet, Rfl: 1   EPINEPHrine 0.3 mg/0.3 mL IJ SOAJ injection, Inject 0.3 mg into the muscle as needed for anaphylaxis., Disp: 1 each, Rfl: 3   gabapentin (NEURONTIN) 600 MG tablet, TAKE 1 TABLET BY MOUTH 3 TIMES  DAILY, Disp: 300 tablet, Rfl: 1   HYDROcodone-acetaminophen (NORCO/VICODIN) 5-325 MG tablet, , Disp: , Rfl:    levothyroxine (SYNTHROID) 88 MCG tablet, TAKE 1 TABLET BY MOUTH DAILY  BEFORE BREAKFAST, Disp: 100 tablet, Rfl: 2   losartan-hydrochlorothiazide (HYZAAR) 100-25 MG tablet, TAKE 1 TABLET BY MOUTH EVERY  MORNING BEFORE BREAKFAST (Patient not taking: Reported on 12/28/2022), Disp: 100 tablet, Rfl: 2   omeprazole (PRILOSEC) 40 MG capsule, TAKE 1 CAPSULE BY MOUTH DAILY, Disp: 100 capsule, Rfl: 2   ondansetron (ZOFRAN) 4 MG tablet, Take 1 tablet (4 mg total) by mouth every 8 (eight) hours as needed for nausea or vomiting., Disp: 20 tablet, Rfl: 0   potassium chloride SA (KLOR-CON M) 20 MEQ tablet, TAKE 1 TABLET BY MOUTH DAILY, Disp: 100 tablet, Rfl: 2   sertraline (ZOLOFT) 100 MG tablet, TAKE 1 TABLET BY MOUTH DAILY, Disp: 90 tablet, Rfl: 3  Allergies  Allergen  Reactions   Bee Venom Anaphylaxis   Fentanyl Other (See Comments)    Very low BP, decrease LOC-received Narcan-in 2020    Assessment and Plan:   Diagnosis: nausea. Please see myChart communication and orders below.   No orders of the defined types were placed in this encounter.  Meds ordered this encounter  Medications   ondansetron (ZOFRAN) 4 MG tablet    Sig: Take 1 tablet (4 mg total) by mouth every 8 (eight) hours as needed for nausea or vomiting.    Dispense:  20 tablet    Refill:  0    Neena Rhymes, MD 03/09/2023  A total of 6 minutes were spent by me to personally review the patient-generated inquiry, review patient records and data pertinent to assessment of the patient's problem, develop a management plan including generation of prescriptions and/or orders, and on subsequent communication with the patient through secure the MyChart portal service.   There is no separately reported E/M service related to this service in the past 7 days nor does the patient have an upcoming soonest available appointment for this issue. This work was completed in less than 7 days.   The patient consented to this service today (see patient agreement prior to ongoing communication). Patient counseled regarding the need for in-person exam for certain conditions and was advised to call the office if any changing or worsening symptoms occur.   The codes to be used for the E/M service are: [x]   99421 for 5-10 minutes of time spent on the inquiry. []   [id number] for 11-20 minutes. []   [id number] for 21+ minutes.

## 2023-03-13 ENCOUNTER — Encounter: Payer: Self-pay | Admitting: Family Medicine

## 2023-03-13 ENCOUNTER — Ambulatory Visit (INDEPENDENT_AMBULATORY_CARE_PROVIDER_SITE_OTHER): Payer: Medicare Other | Admitting: Family Medicine

## 2023-03-13 VITALS — BP 126/84 | HR 51 | Temp 98.4°F | Resp 18 | Wt 197.1 lb

## 2023-03-13 DIAGNOSIS — S0181XA Laceration without foreign body of other part of head, initial encounter: Secondary | ICD-10-CM

## 2023-03-13 DIAGNOSIS — S0083XA Contusion of other part of head, initial encounter: Secondary | ICD-10-CM

## 2023-03-13 DIAGNOSIS — S060X0A Concussion without loss of consciousness, initial encounter: Secondary | ICD-10-CM

## 2023-03-13 MED ORDER — MECLIZINE HCL 25 MG PO TABS: 25.0000 mg | ORAL_TABLET | Freq: Three times a day (TID) | ORAL | 0 refills | Status: DC | PRN

## 2023-03-13 NOTE — Patient Instructions (Signed)
Follow up as needed or as scheduled Continue Tylenol as needed for headache and facial pain ICE or HEAT at this time- whichever feels better USE the Meclizine as needed for dizziness CONTINUE the Zofran as needed for nausea We'll call you to schedule neuro rehab REST!  Both physical and mental rest Call with any questions or concerns Hang in there!!!

## 2023-03-13 NOTE — Progress Notes (Signed)
Subjective:    Patient ID: Julie Ross, female    DOB: [birth year], 62 y.o.   MRN: [id number]  HPI Fall- occurred on 9/17 when she was walking in a restaurant and slipped on water on the floor.  She fell forward, hitting her face.  On the scene had immediate dizziness, then had vomiting upon sitting up.  She had 2 small lacerations near her R eyebrow and a significant hematoma w/ bruising.  Her lacerations were repaired w/ 2 and 3 sutures respectively.  Here today for suture removal.  Pt continues to have dizziness and nausea.  Pt reports she will have a spinning sensation upon lying down or changing positions.   Review of Systems For ROS see HPI     Objective:   Physical Exam Vitals reviewed.  Constitutional:      General: She is not in acute distress.    Appearance: She is not ill-appearing.     Comments: Very bruised on R face  HENT:     Head:     Comments: Extensive bruising of R forehead, R eye, and R side of face.  Hematoma of R orbital ridge.  2 small lacerations above R eyebrow are beautifully stitched, well approximated, and sutures were removed w/o difficulty.    Right Ear: Tympanic membrane and ear canal normal.     Left Ear: Tympanic membrane and ear canal normal.  Eyes:     Conjunctiva/sclera: Conjunctivae normal.     Pupils: Pupils are equal, round, and reactive to light.     Comments: Horizontal nystagmus looking both L and R  Skin:    General: Skin is warm and dry.     Findings: Bruising (of R face) present.  Neurological:     General: No focal deficit present.     Mental Status: She is alert and oriented to person, place, and time.     Cranial Nerves: No cranial nerve deficit.  Psychiatric:        Mood and Affect: Mood normal.        Behavior: Behavior normal.        Thought Content: Thought content normal.          Assessment & Plan:  Facial laceration- new.  Well approximated, sutures ready to be removed.  Removed w/o difficulty and pt tolerated  well.  Facial contusion- new.  Pt w/ hematoma of R orbital ridge.  No bony abnormality.  CT reviewed- no fracture present.  Encouraged ice or heat- whichever pt feels provides more relief.  Concussion- new.  Pt continues to have headache, dizziness, and nausea.  Has Zofran to use.  Will start Meclizine prn and refer to Neurorehab.  Reviewed need for physical and cognitive rest.  Pt expressed understanding.

## 2023-03-15 ENCOUNTER — Telehealth: Payer: Self-pay | Admitting: Family Medicine

## 2023-03-15 NOTE — Telephone Encounter (Signed)
Called and left a detailed VM to clarify and call back if further information is needed.

## 2023-03-15 NOTE — Telephone Encounter (Signed)
[facility] called to inquire about orders for which rehab pt is suppose to be receiving. OT, PT, or ST? They need clarification so she can receive the correct therapies.

## 2023-03-15 NOTE — Telephone Encounter (Signed)
She needs neuro/vestibular rehab b/c she has a concussion w/ persistent dizziness

## 2023-03-16 ENCOUNTER — Encounter: Payer: Self-pay | Admitting: Family Medicine

## 2023-03-23 ENCOUNTER — Ambulatory Visit (HOSPITAL_COMMUNITY)
Admission: RE | Admit: 2023-03-23 | Discharge: 2023-03-23 | Disposition: A | Discharge status: Home / Self Care | Payer: Medicare Other | Source: Ambulatory Visit | Attending: Internal Medicine | Admitting: Internal Medicine

## 2023-03-23 DIAGNOSIS — D329 Benign neoplasm of meninges, unspecified: Secondary | ICD-10-CM | POA: Diagnosis not present

## 2023-03-23 DIAGNOSIS — R9082 White matter disease, unspecified: Secondary | ICD-10-CM | POA: Diagnosis not present

## 2023-03-23 DIAGNOSIS — D320 Benign neoplasm of cerebral meninges: Secondary | ICD-10-CM | POA: Diagnosis not present

## 2023-03-23 MED ORDER — GADOBUTROL 1 MMOL/ML IV SOLN
9.0000 mL | Freq: Once | INTRAVENOUS | Status: AC | PRN
  Administered 2023-03-23: 9 mL via INTRAVENOUS

## 2023-03-27 ENCOUNTER — Inpatient Hospital Stay: Payer: Medicare Other | Attending: Internal Medicine | Admitting: Internal Medicine

## 2023-03-27 VITALS — BP 164/66 | HR 49 | Temp 98.0°F | Resp 16 | Ht 66.0 in | Wt 197.9 lb

## 2023-03-27 DIAGNOSIS — R42 Dizziness and giddiness: Secondary | ICD-10-CM | POA: Diagnosis not present

## 2023-03-27 DIAGNOSIS — Z9012 Acquired absence of left breast and nipple: Secondary | ICD-10-CM | POA: Diagnosis not present

## 2023-03-27 DIAGNOSIS — C50412 Malignant neoplasm of upper-outer quadrant of left female breast: Secondary | ICD-10-CM | POA: Insufficient documentation

## 2023-03-27 DIAGNOSIS — Z9221 Personal history of antineoplastic chemotherapy: Secondary | ICD-10-CM | POA: Diagnosis not present

## 2023-03-27 DIAGNOSIS — R9089 Other abnormal findings on diagnostic imaging of central nervous system: Secondary | ICD-10-CM

## 2023-03-27 DIAGNOSIS — Z923 Personal history of irradiation: Secondary | ICD-10-CM | POA: Diagnosis not present

## 2023-03-27 DIAGNOSIS — D320 Benign neoplasm of cerebral meninges: Secondary | ICD-10-CM | POA: Diagnosis not present

## 2023-03-27 DIAGNOSIS — Z79811 Long term (current) use of aromatase inhibitors: Secondary | ICD-10-CM | POA: Insufficient documentation

## 2023-03-27 DIAGNOSIS — C50411 Malignant neoplasm of upper-outer quadrant of right female breast: Secondary | ICD-10-CM | POA: Diagnosis not present

## 2023-03-27 DIAGNOSIS — D329 Benign neoplasm of meninges, unspecified: Secondary | ICD-10-CM | POA: Diagnosis not present

## 2023-03-27 NOTE — Progress Notes (Signed)
[facility] at [facility] [address]  [place], [place] [address] [phone]   Interval Evaluation  Date of Service: 03/27/23 Patient Name: Julie Ross Patient MRN: [id number] Patient DOB: [birth year] Provider: Henreitta Leber, MD  Identifying Statement:  Julie Ross is a 62 y.o. female with  multifocal  meningioma    Oncologic History: Oncology History  Breast cancer of upper-outer quadrant of right female breast (HCC)  11/22/2011 Initial Diagnosis   Cancer of upper-outer quadrant of female breast   12/19/2011 Surgery   Left mastectomy with SLN biopsy 2 foci of invasive ductal carcinoma 0.8 and 0.6 cm low-grade ER PR positive HER-2 positive ratio 3.27 Ki-67 11% one lymph node positive out of 4 (T1, N1, M0 stage II)   02/16/2012 - 04/14/2013 Chemotherapy   Taxotere, carboplatin, Herceptin x2 cycles complicated by bacteremia and left knee sepsis requiring revision of the knee. Herceptin maintenance every 3 weeks. Herceptin maintenance completed 04/14/2013   04/17/2012 - 06/04/2012 Radiation Therapy   Radiation therapy to the breast   02/14/2013 Genetic Testing   Negative genetic testing on the Breast/Ovarian Cancer panel.  The Breast/Ovarian gene panel offered by [facility] includes sequencing and rearrangement analysis for the following 21 genes:  ATM, BARD1, BRCA1, BRCA2, BRIP1, CDH1, CHEK2, EPCAM, FANCC, MLH1, MSH2, MSH6, NBN, PALB2, PMS2, PTEN, STK11, RAD51C, RAD51D, TP53, and XRCC2.   The report date is February 14, 2013.   03/13/2013 -  Anti-estrogen oral therapy   Letrozole 2.5 mg once daily     CNS Oncologic History 10/15/20: SRS to two meningomas, L sphenoid wing and L tentorium Julie Ross)  Interval History: [name] presents today for follow up after recent MRI brain.  She had a provoked fall last month, hit right side of head with laceration, no clear loss of consciousness.  Since the event she describes dizziness "in my head" with some  imbalance.  Taking meclizine which has not helped much.  She has upcoming vestibular rehab sessions.  No seizures or headaches otherwise.  (H+P) Patient presents today for follow up after completing post-SRS MRI study.  She describes no new or progressive neurologic deficits.  No facial weakness, numbness, or double vision.  Continues to follow with Dr. Epimenio Foot for history of MS.  Functional status is independent, doesn't require assistance with ADLs.  Medications: Current Outpatient Medications on File Prior to Visit  Medication Sig Dispense Refill   Ascorbic Acid (VITAMIN C PO) Take by mouth.     aspirin EC 81 MG tablet Take 1 tablet (81 mg total) by mouth daily. Swallow whole. 90 tablet 3   atorvastatin (LIPITOR) 20 MG tablet TAKE 1 TABLET BY MOUTH ONCE  DAILY 100 tablet 2   baclofen (LIORESAL) 10 MG tablet TAKE 1 TABLET BY MOUTH 3 TIMES  DAILY AS NEEDED FOR MUSCLE  SPASM(S) 270 tablet 3   buPROPion (WELLBUTRIN XL) 150 MG 24 hr tablet TAKE 1 TABLET BY MOUTH DAILY 90 tablet 3   clonazePAM (KLONOPIN) 1 MG tablet TAKE 1 TABLET BY MOUTH AT  BEDTIME 90 tablet 1   EPINEPHrine 0.3 mg/0.3 mL IJ SOAJ injection Inject 0.3 mg into the muscle as needed for anaphylaxis. 1 each 3   gabapentin (NEURONTIN) 600 MG tablet TAKE 1 TABLET BY MOUTH 3 TIMES  DAILY 300 tablet 1   HYDROcodone-acetaminophen (NORCO/VICODIN) 5-325 MG tablet      levothyroxine (SYNTHROID) 88 MCG tablet TAKE 1 TABLET BY MOUTH DAILY  BEFORE BREAKFAST 100 tablet 2  losartan-hydrochlorothiazide (HYZAAR) 100-25 MG tablet TAKE 1 TABLET BY MOUTH EVERY  MORNING BEFORE BREAKFAST (Patient taking differently: TAKE 1 TABLET BY MOUTH EVERY  MORNING BEFORE BREAKFAST) 100 tablet 2   meclizine (ANTIVERT) 25 MG tablet Take 1 tablet (25 mg total) by mouth 3 (three) times daily as needed for dizziness. 45 tablet 0   omeprazole (PRILOSEC) 40 MG capsule TAKE 1 CAPSULE BY MOUTH DAILY 100 capsule 2   ondansetron (ZOFRAN) 4 MG tablet Take 1 tablet (4 mg total) by  mouth every 8 (eight) hours as needed for nausea or vomiting. 20 tablet 0   potassium chloride SA (KLOR-CON M) 20 MEQ tablet TAKE 1 TABLET BY MOUTH DAILY 100 tablet 2   sertraline (ZOLOFT) 100 MG tablet TAKE 1 TABLET BY MOUTH DAILY 90 tablet 3   No current facility-administered medications on file prior to visit.    Allergies:  Allergies  Allergen Reactions   Bee Venom Anaphylaxis   Fentanyl Other (See Comments)    Very low BP, decrease LOC-received Narcan-in 2020   Past Medical History:  Past Medical History:  Diagnosis Date   Anemia    h/o - ? bld. transfusion - more recent- ?2013   Anxiety    uses xanax mainly for sleep   Arthritis    knees    Basal cell carcinoma 01/11/2005   rim left nostril-,mohs   BCC (basal cell carcinoma) 01/11/2005   right cheek mid-mohs   BCC (basal cell carcinoma) 09/12/2006   right forehead -mohs   BCC (basal cell carcinoma) 01/19/2016   right upper arm -[id number]   BCC (basal cell carcinoma) 02/18/2019   forehead-mohs   Breast cancer (HCC) 11/2011   lul/ER+PR+, x1 lymph node    Cancer (HCC)    squamous cell skin cancer x7   Complication of anesthesia    Depression    GERD (gastroesophageal reflux disease)    H/O bone density study 03/2011   H/O colonoscopy    H/O echocardiogram    last one 02/2013, due to chemotherapy   Heart burn    Hx of radiation therapy 04/17/12- 06/04/12   left chest wall, axilla, L supraclavicular fossa 4500 cGy, left mastectomy scar/chest wall 5940 cGy   Hypertension    Hypothyroidism    IBS (irritable bowel syndrome)    Meningioma (HCC) 08/2020   Multiple sclerosis (HCC)    Neuromuscular disorder (HCC)    MS TX WITH CYMBALTA    Personal history of chemotherapy    Personal history of radiation therapy    SCC (squamous cell carcinoma) 01/11/2005   chest mid   Sleep apnea    no CPAP   Squamous cell carcinoma of skin 01/11/2005   over left lip   Wears glasses    Past Surgical History:  Past Surgical  History:  Procedure Laterality Date   APPLICATION OF ROBOTIC ASSISTANCE FOR SPINAL PROCEDURE N/A 03/12/2019   Procedure: APPLICATION OF ROBOTIC ASSISTANCE FOR SPINAL PROCEDURE;  Surgeon: Jadene Pierini, MD;  Location: [facility] OR;  Service: Neurosurgery;  Laterality: N/A;   BREAST REDUCTION SURGERY Right 01/25/2021   Procedure: MAMMARY REDUCTION  (BREAST);  Surgeon: Glenna Fellows, MD;  Location: [facility];  Service: Plastics;  Laterality: Right;   CESAREAN SECTION     X2    COLONOSCOPY     ESOPHAGOGASTRODUODENOSCOPY     FLEXIBLE SIGMOIDOSCOPY     I & D KNEE WITH POLY EXCHANGE  03/02/2012   Procedure: IRRIGATION AND DEBRIDEMENT KNEE WITH POLY EXCHANGE;  Surgeon: Harvie Junior, MD;  Location: [facility] OR;  Service: Orthopedics;  Laterality: Left;  I&D of total knee with Possible Poly Exchange    JOINT REPLACEMENT  07/22/2011   left knee- multiple surgeries    KNEE ARTHROSCOPY     LT KNEE 04/2011   KNEE ARTHROSCOPY  06/20/1982   rt   MASTECTOMY     MASTECTOMY W/ SENTINEL NODE BIOPSY  12/19/2011   Procedure: MASTECTOMY WITH SENTINEL LYMPH NODE BIOPSY;  Surgeon: Currie Paris, MD;  Location: [facility] OR;  Service: General;  Laterality: Left;  left breast and left axilla    MOHS SURGERY     right face   PORT-A-CATH REMOVAL  03/06/2012   Procedure: REMOVAL PORT-A-CATH;  Surgeon: Adolph Pollack, MD;  Location: [facility] OR;  Service: General;  Laterality: N/A;   PORT-A-CATH REMOVAL Right 08/22/2012   Procedure: REMOVAL PORT-A-CATH;  Surgeon: Mariella Saa, MD;  Location: [facility] OR;  Service: General;  Laterality: Right;   PORTACATH PLACEMENT  01/16/2012   Procedure: INSERTION PORT-A-CATH;  Surgeon: Currie Paris, MD;  Location: [facility];  Service: General;  Laterality: Right;  Porta Cath Placement    PORTACATH PLACEMENT  05/01/2012   Procedure: INSERTION PORT-A-CATH;  Surgeon: Currie Paris, MD;  Location: [facility];  Service: General;   Laterality: Right;  right internal jugular port-a-cath insertion   TEE WITHOUT CARDIOVERSION  03/06/2012   Procedure: TRANSESOPHAGEAL ECHOCARDIOGRAM (TEE);  Surgeon: Wendall Stade, MD;  Location: [facility];  Service: Cardiovascular;  Laterality: N/A;  Rm. [id number]   TONSILLECTOMY     TOTAL KNEE ARTHROPLASTY  08/15/2011   Procedure: TOTAL KNEE ARTHROPLASTY; lft Surgeon: Harvie Junior, MD;  Location: [facility];  Service: Orthopedics;  Laterality: Left;  RIGHT KNEE CORTIZONE INJECTION   TOTAL KNEE ARTHROPLASTY Left 08/18/2012   Procedure:  Irrigation and debridement of LEFT total knee;  Removal of total knee parts; Implant of spacers;  Surgeon: Nestor Lewandowsky, MD;  Location: [facility] OR;  Service: Orthopedics;  Laterality: Left;   TOTAL KNEE ARTHROPLASTY Right 08/12/2013   TOTAL KNEE ARTHROPLASTY Right 08/12/2013   Procedure: RIGHT TOTAL KNEE ARTHROPLASTY;  Surgeon: Nestor Lewandowsky, MD;  Location: [facility];  Service: Orthopedics;  Laterality: Right;   TOTAL KNEE REVISION Left 04/19/2013   Procedure: TOTAL KNEE REVISION AND REMOVAL CEMENT SPACER;  Surgeon: Nestor Lewandowsky, MD;  Location: [facility];  Service: Orthopedics;  Laterality: Left;   TRANSFORAMINAL LUMBAR INTERBODY FUSION (TLIF) WITH PEDICLE SCREW FIXATION 1 LEVEL Bilateral 03/12/2019   Procedure: Lumbar four-five Open transforaminal lumbar interbody fusion with Lumbar four-five laminectomy, bilateral Lumbar four-five Pedicle screw placement;  Surgeon: Jadene Pierini, MD;  Location: [facility];  Service: Neurosurgery;  Laterality: Bilateral;   TUMOR REMOVAL     FROM PELVIS AGE 83   Social History:  Social History   Socioeconomic History   Marital status: Married    Spouse name: Not on file   Number of children: 2   Years of education: Not on file   Highest education level: Bachelor's degree (e.g., BA, AB, BS)  Occupational History    Employer: [employer]    Occupation: disabled  Tobacco Use   Smoking status: Never   Smokeless tobacco: Never   Vaping Use   Vaping status: Never Used  Substance and Sexual Activity   Alcohol use: No   Drug use: No   Sexual activity: Yes    Birth control/protection: Post-menopausal  Other Topics  Concern   Not on file  Social History Narrative   Right Handed   1 cup of Tea per Day   Social Determinants of Health   Financial Resource Strain: Low Risk  (03/10/2023)   Overall Financial Resource Strain (CARDIA)    Difficulty of Paying Living Expenses: Not hard at all  Food Insecurity: No Food Insecurity (03/10/2023)   Hunger Vital Sign    Worried About Running Out of Food in the Last Year: Never true    Ran Out of Food in the Last Year: Never true  Transportation Needs: No Transportation Needs (03/10/2023)   PRAPARE - Administrator, Civil Service (Medical): No    Lack of Transportation (Non-Medical): No  Physical Activity: Unknown (03/10/2023)   Exercise Vital Sign    Days of Exercise per Week: Patient declined    Minutes of Exercise per Session: 40 min  Recent Concern: Physical Activity - Insufficiently Active (12/28/2022)   Exercise Vital Sign    Days of Exercise per Week: 2 days    Minutes of Exercise per Session: 40 min  Stress: No Stress Concern Present (03/10/2023)   [employer] of Occupational Health - Occupational Stress Questionnaire    Feeling of Stress : Only a little  Social Connections: Unknown (03/10/2023)   Social Connection and Isolation Panel [NHANES]    Frequency of Communication with Friends and Family: More than three times a week    Frequency of Social Gatherings with Friends and Family: More than three times a week    Attends Religious Services: Patient declined    Database administrator or Organizations: No    Attends Engineer, structural: More than 4 times per year    Marital Status: Married  Catering manager Violence: Not At Risk (12/28/2022)   Humiliation, Afraid, Rape, and Kick questionnaire    Fear of Current or Ex-Partner: No     Emotionally Abused: No    Physically Abused: No    Sexually Abused: No   Family History:  Family History  Problem Relation Age of Onset   Arthritis Mother    Hypertension Mother    Heart disease Mother        CHF   Dementia Father    Diabetes type II Father    Heart disease Maternal Grandfather    Breast cancer Paternal Grandmother        dx in her 33s   Lung cancer Maternal Uncle        smoker   Breast cancer Paternal Aunt        dx in her 70s   Ovarian cancer Paternal Aunt        dx in her 30s   Colon cancer Neg Hx    Stomach cancer Neg Hx    Esophageal cancer Neg Hx     Review of Systems: Constitutional: Doesn't report fevers, chills or abnormal weight loss Eyes: Doesn't report blurriness of vision Ears, nose, mouth, throat, and face: Doesn't report sore throat Respiratory: Doesn't report cough, dyspnea or wheezes Cardiovascular: Doesn't report palpitation, chest discomfort  Gastrointestinal:  Doesn't report nausea, constipation, diarrhea GU: Doesn't report incontinence Skin: Doesn't report skin rashes Neurological: Per HPI Musculoskeletal: Doesn't report joint pain Behavioral/Psych: Doesn't report anxiety  Physical Exam: Vitals:   03/27/23 1217  BP: (!) 164/66  Pulse: (!) 49  Resp: 16  Temp: 98 F (36.7 C)  SpO2: 98%     KPS: 90. General: Alert, cooperative, pleasant, in no acute distress  Head: Normal EENT: No conjunctival injection or scleral icterus.  Lungs: Resp effort normal Cardiac: Regular rate Abdomen: Non-distended abdomen Skin: No rashes cyanosis or petechiae. Extremities: No clubbing or edema  Neurologic Exam: Mental Status: Awake, alert, attentive to examiner. Oriented to self and environment. Language is fluent with intact comprehension.  Cranial Nerves: Visual acuity is grossly normal. Visual fields are full. Extra-ocular movements intact. No ptosis. Face is symmetric Motor: Tone and bulk are normal. Power is full in both arms and  legs. Reflexes are symmetric, no pathologic reflexes present.  Sensory: Intact to light touch Gait: Normal.   Labs: I have reviewed the data as listed    Component Value Date/Time   NA 134 (L) 10/10/2022 1139   NA 134 04/06/2021 0957   NA 138 08/18/2014 1551   K 4.1 10/10/2022 1139   K 3.7 08/18/2014 1551   CL 97 10/10/2022 1139   CL 100 11/30/2012 1029   CO2 28 10/10/2022 1139   CO2 27 08/18/2014 1551   GLUCOSE 95 10/10/2022 1139   GLUCOSE 117 08/18/2014 1551   GLUCOSE 91 11/30/2012 1029   BUN 12 10/10/2022 1139   BUN 17 04/06/2021 0957   BUN 17.2 08/18/2014 1551   CREATININE 1.10 10/10/2022 1139   CREATININE 1.36 (H) 09/30/2020 1412   CREATININE 1.29 (H) 06/26/2017 1603   CREATININE 1.3 (H) 08/18/2014 1551   CALCIUM 9.3 10/10/2022 1139   CALCIUM 9.1 08/18/2014 1551   PROT 7.1 10/10/2022 1139   PROT 7.3 05/31/2018 1545   PROT 7.1 08/18/2014 1551   ALBUMIN 4.3 10/10/2022 1139   ALBUMIN 4.6 05/31/2018 1545   ALBUMIN 4.0 08/18/2014 1551   AST 17 10/10/2022 1139   AST 19 08/18/2014 1551   ALT 11 10/10/2022 1139   ALT 21 08/18/2014 1551   ALKPHOS 119 (H) 10/10/2022 1139   ALKPHOS 137 08/18/2014 1551   BILITOT 0.8 10/10/2022 1139   BILITOT 0.6 05/31/2018 1545   BILITOT 0.46 08/18/2014 1551   GFRNONAA >60 01/20/2021 1532   GFRNONAA 44 (L) 09/30/2020 1412   GFRAA 57 (L) 10/09/2019 1458   Lab Results  Component Value Date   WBC 5.7 10/10/2022   NEUTROABS 3.5 10/10/2022   HGB 11.9 (L) 10/10/2022   HCT 36.3 10/10/2022   MCV 76.7 (L) 10/10/2022   PLT 243.0 10/10/2022    Imaging: [facility] Clinician Interpretation: I have personally reviewed the CNS images as listed.  My interpretation, in the context of the patient's clinical presentation, is stable disease with regards to meningioma.  Left frontal T2/FLAIR signal abnormality increased from prior.   MR BRAIN W WO CONTRAST  Result Date: 03/25/2023 CLINICAL DATA:  Seen is neoplasm, assess treatment response EXAM: MRI  HEAD WITHOUT AND WITH CONTRAST TECHNIQUE: Multiplanar, multiecho pulse sequences of the brain and surrounding structures were obtained without and with intravenous contrast. CONTRAST:  9mL GADAVIST GADOBUTROL 1 MMOL/ML IV SOLN COMPARISON:  Head CT 03/07/2023 and brain MRI 03/18/2022 FINDINGS: Brain: Enhancing dural based mass projecting medially from the left tentorial incisura measuring 7 mm, just above the cisternal trigeminal nerve. Rounded meningioma projecting superiorly from the left sphenoid wing and encompassing the left anterior clinoid laterally, ball like area measuring 14 mm, stable from prior. Fairly broad dural tail that is also non progressed. Tiny additional meningioma is likely along the superior left frontal convexity measuring 5 mm on 16:150, in retrospect stable. Scattered FLAIR hyperintensities in the cerebral white matter non progressed. Hazy T2 hyperintensity in the 2 locations of the  subcortical left frontal lobe as marked on series 9. These were present on prior with similar appearance and have become subtly more apparent on recent scans. No superimposed enhancement. Cannot exclude an infiltrating lesion such as low-grade glioma. The location would not be expected for a radiation injury if applied radiation to the meningiomas. Vascular: Major flow voids and vascular enhancements are preserved Skull and upper cervical spine: Normal marrow signal. Sinuses/Orbits: Negative Other: Small collections superior to the right orbit correlate with recent trauma seen by CT. IMPRESSION: 1. No progression of left sphenoid wing and tentorial meningiomas, as described above. 2. 5 mm meningioma along the superior left frontal convexity, in retrospect stable. 3. Chronic white matter disease is stable. There are indistinct areas of more subtle and infiltrating T2 hyperintensity at the left frontal lobe, nonenhancing. Consider shorter term follow-up for this finding. Electronically Signed   By: Tiburcio Pea  M.D.   On: 03/25/2023 10:40   CT Maxillofacial Wo Contrast  Result Date: 03/07/2023 CLINICAL DATA:  Head trauma, moderate to severe. EXAM: CT HEAD WITHOUT CONTRAST CT MAXILLOFACIAL WITHOUT CONTRAST TECHNIQUE: Multidetector CT imaging of the head and maxillofacial structures were performed using the standard protocol without intravenous contrast. Multiplanar CT image reconstructions of the maxillofacial structures were also generated. RADIATION DOSE REDUCTION: This exam was performed according to the departmental dose-optimization program which includes automated exposure control, adjustment of the mA and/or kV according to patient size and/or use of iterative reconstruction technique. COMPARISON:  Brain MRI 03/18/2022 FINDINGS: CT HEAD FINDINGS Brain: No evidence of acute infarction, hemorrhage, hydrocephalus, extra-axial collection or mass effect. Two known meningiomas along the left tentorium and left sphenoid wing, recently characterized by MRI. Vascular: No hyperdense vessel or unexpected calcification. Skull: No calvarial fracture CT MAXILLOFACIAL FINDINGS Osseous: No fracture or mandibular dislocation. No destructive process. Orbits: Prominent soft tissue contusion over the preseptal right orbit. No opaque foreign body Sinuses: Negative for hemosinus Soft tissues: Soft tissue swelling to the right face with gas from laceration. IMPRESSION: 1. No evidence of intracranial injury. 2. Right facial swelling without acute fracture or evidence of postseptal injury. Electronically Signed   By: Tiburcio Pea M.D.   On: 03/07/2023 18:16   CT Head Wo Contrast  Result Date: 03/07/2023 CLINICAL DATA:  Head trauma, moderate to severe. EXAM: CT HEAD WITHOUT CONTRAST CT MAXILLOFACIAL WITHOUT CONTRAST TECHNIQUE: Multidetector CT imaging of the head and maxillofacial structures were performed using the standard protocol without intravenous contrast. Multiplanar CT image reconstructions of the maxillofacial structures  were also generated. RADIATION DOSE REDUCTION: This exam was performed according to the departmental dose-optimization program which includes automated exposure control, adjustment of the mA and/or kV according to patient size and/or use of iterative reconstruction technique. COMPARISON:  Brain MRI 03/18/2022 FINDINGS: CT HEAD FINDINGS Brain: No evidence of acute infarction, hemorrhage, hydrocephalus, extra-axial collection or mass effect. Two known meningiomas along the left tentorium and left sphenoid wing, recently characterized by MRI. Vascular: No hyperdense vessel or unexpected calcification. Skull: No calvarial fracture CT MAXILLOFACIAL FINDINGS Osseous: No fracture or mandibular dislocation. No destructive process. Orbits: Prominent soft tissue contusion over the preseptal right orbit. No opaque foreign body Sinuses: Negative for hemosinus Soft tissues: Soft tissue swelling to the right face with gas from laceration. IMPRESSION: 1. No evidence of intracranial injury. 2. Right facial swelling without acute fracture or evidence of postseptal injury. Electronically Signed   By: Tiburcio Pea M.D.   On: 03/07/2023 18:16     Assessment/Plan Meningioma (HCC)  Grisell Harp Hoog is clinically stable today, with slowly improving post-concussive vertigo syndrome.  MRI brain demonstrates stable findings with regards to treated meningiomas.  There is a very small untreated lesion at the high left convexity, stable from prior.  Increase in T2/FLAIR signal abnormality in left frontal lobe is of unclear etiology; could be post-traumatic, inflammatory/MS, or occult gliomatosis.    Recommended repeating MRI brain sooner, in 6 months.  This was discussed additionally with radiology.  She will continue to follow with Dr. Epimenio Foot as well.  Very much agree with vestibular rehab, limiting meclizine to PRN as discussed.  We ask that KENDRALYN COOTS return to clinic in 6 months following next brain MRI, or sooner as  needed.  All questions were answered. The patient knows to call the clinic with any problems, questions or concerns. No barriers to learning were detected.  The total time spent in the encounter was 40 minutes and more than 50% was on counseling and review of test results   Henreitta Leber, MD Medical Director of Neuro-Oncology [facility] at [facility] 03/27/23 12:29 PM

## 2023-03-29 ENCOUNTER — Ambulatory Visit: Payer: Medicare Other | Attending: Family Medicine

## 2023-03-29 DIAGNOSIS — R2689 Other abnormalities of gait and mobility: Secondary | ICD-10-CM | POA: Insufficient documentation

## 2023-03-29 DIAGNOSIS — R42 Dizziness and giddiness: Secondary | ICD-10-CM | POA: Insufficient documentation

## 2023-03-29 DIAGNOSIS — F0781 Postconcussional syndrome: Secondary | ICD-10-CM | POA: Diagnosis present

## 2023-03-29 NOTE — Therapy (Signed)
OUTPATIENT PHYSICAL THERAPY VESTIBULAR EVALUATION     Patient Name: Julie Ross MRN: [id number] DOB:1960/06/26, 62 y.o., female Today's Date: 03/29/2023  END OF SESSION:  PT End of Session - 03/29/23 1527     Visit Number 1    Date for PT Re-Evaluation 06/21/23    Authorization Type [employer]    PT Start Time 1530    PT Stop Time 1615    PT Time Calculation (min) 45 min    Activity Tolerance Other (comment)   limited by dizziness/nausea   Behavior During Therapy WFL for tasks assessed/performed             Past Medical History:  Diagnosis Date   Anemia    h/o - ? bld. transfusion - more recent- ?2013   Anxiety    uses xanax mainly for sleep   Arthritis    knees    Basal cell carcinoma 01/11/2005   rim left nostril-,mohs   BCC (basal cell carcinoma) 01/11/2005   right cheek mid-mohs   BCC (basal cell carcinoma) 09/12/2006   right forehead -mohs   BCC (basal cell carcinoma) 01/19/2016   right upper arm -[id number]   BCC (basal cell carcinoma) 02/18/2019   forehead-mohs   Breast cancer (HCC) 11/2011   lul/ER+PR+, x1 lymph node    Cancer (HCC)    squamous cell skin cancer x7   Complication of anesthesia    Depression    GERD (gastroesophageal reflux disease)    H/O bone density study 03/2011   H/O colonoscopy    H/O echocardiogram    last one 02/2013, due to chemotherapy   Heart burn    Hx of radiation therapy 04/17/12- 06/04/12   left chest wall, axilla, L supraclavicular fossa 4500 cGy, left mastectomy scar/chest wall 5940 cGy   Hypertension    Hypothyroidism    IBS (irritable bowel syndrome)    Meningioma (HCC) 08/2020   Multiple sclerosis (HCC)    Neuromuscular disorder (HCC)    MS TX WITH CYMBALTA    Personal history of chemotherapy    Personal history of radiation therapy    SCC (squamous cell carcinoma) 01/11/2005   chest mid   Sleep apnea    no CPAP   Squamous cell carcinoma of skin 01/11/2005   over left lip   Wears glasses    Past  Surgical History:  Procedure Laterality Date   APPLICATION OF ROBOTIC ASSISTANCE FOR SPINAL PROCEDURE N/A 03/12/2019   Procedure: APPLICATION OF ROBOTIC ASSISTANCE FOR SPINAL PROCEDURE;  Surgeon: Jadene Pierini, MD;  Location: [facility] OR;  Service: Neurosurgery;  Laterality: N/A;   BREAST REDUCTION SURGERY Right 01/25/2021   Procedure: MAMMARY REDUCTION  (BREAST);  Surgeon: Glenna Fellows, MD;  Location: [facility];  Service: Plastics;  Laterality: Right;   CESAREAN SECTION     X2    COLONOSCOPY     ESOPHAGOGASTRODUODENOSCOPY     FLEXIBLE SIGMOIDOSCOPY     I & D KNEE WITH POLY EXCHANGE  03/02/2012   Procedure: IRRIGATION AND DEBRIDEMENT KNEE WITH POLY EXCHANGE;  Surgeon: Harvie Junior, MD;  Location: [facility] OR;  Service: Orthopedics;  Laterality: Left;  I&D of total knee with Possible Poly Exchange    JOINT REPLACEMENT  07/22/2011   left knee- multiple surgeries    KNEE ARTHROSCOPY     LT KNEE 04/2011   KNEE ARTHROSCOPY  06/20/1982   rt   MASTECTOMY     MASTECTOMY W/ SENTINEL NODE BIOPSY  12/19/2011  Procedure: MASTECTOMY WITH SENTINEL LYMPH NODE BIOPSY;  Surgeon: Currie Paris, MD;  Location: [facility];  Service: General;  Laterality: Left;  left breast and left axilla    MOHS SURGERY     right face   PORT-A-CATH REMOVAL  03/06/2012   Procedure: REMOVAL PORT-A-CATH;  Surgeon: Adolph Pollack, MD;  Location: [facility] OR;  Service: General;  Laterality: N/A;   PORT-A-CATH REMOVAL Right 08/22/2012   Procedure: REMOVAL PORT-A-CATH;  Surgeon: Mariella Saa, MD;  Location: [facility];  Service: General;  Laterality: Right;   PORTACATH PLACEMENT  01/16/2012   Procedure: INSERTION PORT-A-CATH;  Surgeon: Currie Paris, MD;  Location: [facility];  Service: General;  Laterality: Right;  Porta Cath Placement    PORTACATH PLACEMENT  05/01/2012   Procedure: INSERTION PORT-A-CATH;  Surgeon: Currie Paris, MD;  Location: [facility];  Service:  General;  Laterality: Right;  right internal jugular port-a-cath insertion   TEE WITHOUT CARDIOVERSION  03/06/2012   Procedure: TRANSESOPHAGEAL ECHOCARDIOGRAM (TEE);  Surgeon: Wendall Stade, MD;  Location: [facility] ENDOSCOPY;  Service: Cardiovascular;  Laterality: N/A;  Rm. [id number]   TONSILLECTOMY     TOTAL KNEE ARTHROPLASTY  08/15/2011   Procedure: TOTAL KNEE ARTHROPLASTY; lft Surgeon: Harvie Junior, MD;  Location: [facility];  Service: Orthopedics;  Laterality: Left;  RIGHT KNEE CORTIZONE INJECTION   TOTAL KNEE ARTHROPLASTY Left 08/18/2012   Procedure:  Irrigation and debridement of LEFT total knee;  Removal of total knee parts; Implant of spacers;  Surgeon: Nestor Lewandowsky, MD;  Location: [facility] OR;  Service: Orthopedics;  Laterality: Left;   TOTAL KNEE ARTHROPLASTY Right 08/12/2013   TOTAL KNEE ARTHROPLASTY Right 08/12/2013   Procedure: RIGHT TOTAL KNEE ARTHROPLASTY;  Surgeon: Nestor Lewandowsky, MD;  Location: [facility];  Service: Orthopedics;  Laterality: Right;   TOTAL KNEE REVISION Left 04/19/2013   Procedure: TOTAL KNEE REVISION AND REMOVAL CEMENT SPACER;  Surgeon: Nestor Lewandowsky, MD;  Location: [facility];  Service: Orthopedics;  Laterality: Left;   TRANSFORAMINAL LUMBAR INTERBODY FUSION (TLIF) WITH PEDICLE SCREW FIXATION 1 LEVEL Bilateral 03/12/2019   Procedure: Lumbar four-five Open transforaminal lumbar interbody fusion with Lumbar four-five laminectomy, bilateral Lumbar four-five Pedicle screw placement;  Surgeon: Jadene Pierini, MD;  Location: [facility];  Service: Neurosurgery;  Laterality: Bilateral;   TUMOR REMOVAL     FROM PELVIS AGE 9   Patient Active Problem List   Diagnosis Date Noted   Vitamin D deficiency 10/10/2022   Cerebral meningioma (HCC) 11/27/2020   Genetic testing 09/11/2020   Obesity (BMI 30-39.9) 07/14/2020   Meningioma (HCC) 06/16/2020   Arthritis 11/04/2019   Insomnia 08/22/2019   Lumbar radiculopathy 03/12/2019   Spondylolisthesis at L4-L5 level 08/24/2018   Facet syndrome, lumbar  08/24/2018   Chronic SI joint pain 03/30/2018   Delayed sleep phase syndrome 03/30/2018   History of optic neuritis 07/19/2016   Hyperlipidemia 06/18/2015   H/O malignant neoplasm of breast 02/18/2015   Gonalgia 02/18/2015   Relapsing remitting multiple sclerosis (HCC) 02/18/2015   Physical exam 12/17/2014   Other fatigue 10/15/2014   Obstructive sleep apnea 10/15/2014   Restless leg syndrome 10/15/2014   Allergic rhinitis 09/30/2014   Memory loss 10/14/2013   Depression, recurrent (HCC) 08/30/2013   S/P total knee arthroplasty 08/23/2013   Hypothyroidism 05/29/2013   Esophageal reflux 05/29/2013   Hx of radiation therapy    Essential hypertension, benign 06/11/2012   Multiple sclerosis (HCC) 01/23/2012   Breast cancer of  upper-outer quadrant of right female breast (HCC) 11/22/2011   Osteoarthritis of both knees 08/15/2011    PCP: Neena Rhymes REFERRING PROVIDER: Neena Rhymes  REFERRING DIAG:  Z30.8M5H (ICD-10-CM) - Concussion without loss of consciousness, initial encounter  Post-concussive vertigo   THERAPY DIAG:  Dizziness and giddiness  Post-concussion vertigo  ONSET DATE: 03/07/23  Rationale for Evaluation and Treatment: Rehabilitation  SUBJECTIVE:   SUBJECTIVE STATEMENT: I slipped at a restaurant and hit my hit. I still get nauseous and dizzy, bright lights in grocery store also make me dizzy.   Pt accompanied by: self  PERTINENT HISTORY:  03/07/23--Patient is a 62 year old female with a history of hypertension, hypothyroid, MS, anemia and cancer who is presenting today after a fall.  Patient was walking into a restaurant and the floor was wet causing her to slip and fall forward hitting her face on the floor.  She denies any loss of consciousness.  She has no neck pain but does have significant facial pain and headache.  No vision changes.  No numbness tingling in the arms or legs or difficulty walking.  She does not take any anticoagulation.  Only  injuries to the face  03/13/23--Facial laceration- new.  Well approximated, sutures ready to be removed.  Removed w/o difficulty and pt tolerated well. Facial contusion- new.  Pt w/ hematoma of R orbital ridge.  No bony abnormality.  CT reviewed- no fracture present.  Encouraged ice or heat- whichever pt feels provides more relief. Concussion- new.  Pt continues to have headache, dizziness, and nausea.  Has Zofran to use.  Will start Meclizine prn and refer to Neurorehab.  Reviewed need for physical and cognitive rest.  Pt expressed understanding  PAIN:  Are you having pain? No  PRECAUTIONS: None  RED FLAGS: None   WEIGHT BEARING RESTRICTIONS: No  FALLS: Has patient fallen in last 6 months? Yes. Number of falls 1  LIVING ENVIRONMENT: Lives with: lives with their spouse Lives in: House/apartment Stairs: Yes: Internal: one flight steps; on right going up  PLOF: Independent  PATIENT GOALS: to feel better and stop feeling dizzy  OBJECTIVE:  Note: Objective measures were completed at Evaluation unless otherwise noted.  DIAGNOSTIC FINDINGS:  IMPRESSION: 1. No progression of left sphenoid wing and tentorial meningiomas, as described above. 2. 5 mm meningioma along the superior left frontal convexity, in retrospect stable. 3. Chronic white matter disease is stable. There are indistinct areas of more subtle and infiltrating T2 hyperintensity at the left frontal lobe, nonenhancing. Consider shorter term follow-up for this finding.    COGNITION: Overall cognitive status: Within functional limits for tasks assessed   SENSATION: WFL  POSTURE:  rounded shoulders and forward head  Cervical ROM:  all WNL   LOWER EXTREMITY MMT: 5/5 BLE    GAIT: Gait pattern: decreased stride length, ataxic, narrow BOS, poor foot clearance- Right, and poor foot clearance- Left Distance walked: in clinic distances Assistive device utilized: None Level of assistance: Complete Independence Comments:  slowed gait, she does not walk straight and tends to veer to the R    VESTIBULAR ASSESSMENT:   SYMPTOM BEHAVIOR:   Non-Vestibular symptoms: headaches and nausea/vomiting  Type of dizziness: Blurred Vision, Imbalance (Disequilibrium), Spinning/Vertigo, and Lightheadedness/Faint  Frequency: every day  Duration: a couple hours  Aggravating factors: Induced by position change: sit to stand and Worse outside or in busy environment  Relieving factors: lying supine, medication, closing eyes, and rest  Progression of symptoms: better  OCULOMOTOR EXAM:  Ocular Alignment: normal  Ocular ROM: No Limitations  Spontaneous Nystagmus: absent  Gaze-Induced Nystagmus: absent  Smooth Pursuits: intact  Saccades: intact   VESTIBULAR - OCULAR REFLEX:   Slow VOR: Comment: able to do about 5 reps before feeling nauseous   VOR Cancellation: Normal  FUNCTIONAL GAIT: 5 times sit to stand: 14.83s Functional gait assessment: 16/30   VESTIBULAR TREATMENT:                                                                                                   DATE: EVAL 03/29/23  PATIENT EDUCATION: Education details: POC and HEP Person educated: Patient Education method: Explanation Education comprehension: verbalized understanding  HOME EXERCISE PROGRAM: Access Code: [email or web address] URL: [email or web address] Date: 03/29/2023 Prepared by: Cassie Freer  Exercises - Standing Gaze Stabilization with Two Near Targets and Head Rotation  - 1 x daily - 7 x weekly - 2 sets - 10 reps - Seated Gaze Stabilization with Head Rotation  - 1 x daily - 7 x weekly - 2 sets - 10 reps - Seated VOR Cancellation  - 1 x daily - 7 x weekly - 2 sets - 10 reps - Standing Tandem Balance with Counter Support  - 1 x daily - 7 x weekly - 2 reps - 15 hold - Standing Single Leg Stance with Counter Support  - 1 x daily - 7 x weekly - 2 reps - 10 hold  GOALS: Goals reviewed with patient? Yes  SHORT TERM GOALS: Target date:  05/10/23 Patient will be independent with initial HEP. Goal status: INITIAL   LONG TERM GOALS: Target date: 06/21/23  Patient will be independent with advanced/ongoing HEP to improve outcomes and carryover.  Goal status: INITIAL  2.  Patient will be to read and look at her phone without dizziness Baseline: unable to do for more than a few mins  Goal status: INITIAL  3.  Patient will report 50% decrease in nausea and dizziness symptoms  Baseline: every day for hours at a time Goal status: INITIAL   4.  Patient will demonstrate at least  25/30 on FGA to improve gait stability and reduce risk for falls. Baseline: 16/30 Goal status: INITIAL   ASSESSMENT:  CLINICAL IMPRESSION: Patient is a 62 y.o. female who was seen today for physical therapy evaluation and treatment for post-concussive vertigo. She slipped at a restaurant and hit her head. She had significant bruising to her R eye which was a lot better today. She continues to have symptoms of nausea and dizziness on a daily basis. Patient reports that she is unable to read or look at her phone for more than a few mins before she feels sick to her stomach. With FGA testing today, she gets frustrated and reports it is aggravating that she is unable to do things she used to such as walking with head turns. She also gets dizzy and nauseous with vestibular testing today. Patient will benefit from PT to address her post concussive vertigo symptoms as well as her gait and balance to decrease her risk for falls.   OBJECTIVE IMPAIRMENTS: Abnormal gait, decreased balance, difficulty walking,  and dizziness.   ACTIVITY LIMITATIONS: stairs, locomotion level, and driving  PARTICIPATION LIMITATIONS: driving, community activity, and yard work  [facility] POTENTIAL: Good  CLINICAL DECISION MAKING: Stable/uncomplicated  EVALUATION COMPLEXITY: Low   PLAN:  PT FREQUENCY: 2x/week  PT DURATION: 12 weeks  PLANNED INTERVENTIONS: Therapeutic exercises,  Therapeutic activity, Neuromuscular re-education, Balance training, Gait training, Patient/Family education, Self Care, Joint mobilization, Stair training, Vestibular training, Canalith repositioning, Cryotherapy, Moist heat, and Manual therapy  PLAN FOR NEXT SESSION: VOR exercises as tolerated, balance training, standing with head turns and progressing to do more multitasking    [employer], PT 03/29/2023, 4:17 PM

## 2023-04-05 ENCOUNTER — Other Ambulatory Visit: Payer: Self-pay

## 2023-04-05 ENCOUNTER — Ambulatory Visit: Payer: Medicare Other

## 2023-04-05 DIAGNOSIS — R42 Dizziness and giddiness: Secondary | ICD-10-CM

## 2023-04-05 DIAGNOSIS — F0781 Postconcussional syndrome: Secondary | ICD-10-CM

## 2023-04-05 DIAGNOSIS — R2689 Other abnormalities of gait and mobility: Secondary | ICD-10-CM

## 2023-04-05 NOTE — Therapy (Signed)
OUTPATIENT PHYSICAL THERAPY VESTIBULAR TREATMENT     Patient Name: Julie Ross MRN: [id number] DOB:04/03/1961, 61 y.o., female Today's Date: 04/05/2023  END OF SESSION:  PT End of Session - 04/05/23 1102     Visit Number 2    Date for PT Re-Evaluation 06/21/23    Authorization Type [employer] Medicare    PT Start Time 1103    PT Stop Time 1145    PT Time Calculation (min) 42 min    Behavior During Therapy WFL for tasks assessed/performed              Past Medical History:  Diagnosis Date   Anemia    h/o - ? bld. transfusion - more recent- ?2013   Anxiety    uses xanax mainly for sleep   Arthritis    knees    Basal cell carcinoma 01/11/2005   rim left nostril-,mohs   BCC (basal cell carcinoma) 01/11/2005   right cheek mid-mohs   BCC (basal cell carcinoma) 09/12/2006   right forehead -mohs   BCC (basal cell carcinoma) 01/19/2016   right upper arm -[id number]   BCC (basal cell carcinoma) 02/18/2019   forehead-mohs   Breast cancer (HCC) 11/2011   lul/ER+PR+, x1 lymph node    Cancer (HCC)    squamous cell skin cancer x7   Complication of anesthesia    Depression    GERD (gastroesophageal reflux disease)    H/O bone density study 03/2011   H/O colonoscopy    H/O echocardiogram    last one 02/2013, due to chemotherapy   Heart burn    Hx of radiation therapy 04/17/12- 06/04/12   left chest wall, axilla, L supraclavicular fossa 4500 cGy, left mastectomy scar/chest wall 5940 cGy   Hypertension    Hypothyroidism    IBS (irritable bowel syndrome)    Meningioma (HCC) 08/2020   Multiple sclerosis (HCC)    Neuromuscular disorder (HCC)    MS TX WITH CYMBALTA    Personal history of chemotherapy    Personal history of radiation therapy    SCC (squamous cell carcinoma) 01/11/2005   chest mid   Sleep apnea    no CPAP   Squamous cell carcinoma of skin 01/11/2005   over left lip   Wears glasses    Past Surgical History:  Procedure Laterality Date   APPLICATION OF  ROBOTIC ASSISTANCE FOR SPINAL PROCEDURE N/A 03/12/2019   Procedure: APPLICATION OF ROBOTIC ASSISTANCE FOR SPINAL PROCEDURE;  Surgeon: Jadene Pierini, MD;  Location: [facility] OR;  Service: Neurosurgery;  Laterality: N/A;   BREAST REDUCTION SURGERY Right 01/25/2021   Procedure: MAMMARY REDUCTION  (BREAST);  Surgeon: Glenna Fellows, MD;  Location: [facility];  Service: Plastics;  Laterality: Right;   CESAREAN SECTION     X2    COLONOSCOPY     ESOPHAGOGASTRODUODENOSCOPY     FLEXIBLE SIGMOIDOSCOPY     I & D KNEE WITH POLY EXCHANGE  03/02/2012   Procedure: IRRIGATION AND DEBRIDEMENT KNEE WITH POLY EXCHANGE;  Surgeon: Harvie Junior, MD;  Location: [facility] OR;  Service: Orthopedics;  Laterality: Left;  I&D of total knee with Possible Poly Exchange    JOINT REPLACEMENT  07/22/2011   left knee- multiple surgeries    KNEE ARTHROSCOPY     LT KNEE 04/2011   KNEE ARTHROSCOPY  06/20/1982   rt   MASTECTOMY     MASTECTOMY W/ SENTINEL NODE BIOPSY  12/19/2011   Procedure: MASTECTOMY WITH SENTINEL LYMPH NODE BIOPSY;  Surgeon: Ephriam Knuckles  Leta Jungling, MD;  Location: [facility];  Service: General;  Laterality: Left;  left breast and left axilla    MOHS SURGERY     right face   PORT-A-CATH REMOVAL  03/06/2012   Procedure: REMOVAL PORT-A-CATH;  Surgeon: Adolph Pollack, MD;  Location: [facility] OR;  Service: General;  Laterality: N/A;   PORT-A-CATH REMOVAL Right 08/22/2012   Procedure: REMOVAL PORT-A-CATH;  Surgeon: Mariella Saa, MD;  Location: [facility];  Service: General;  Laterality: Right;   PORTACATH PLACEMENT  01/16/2012   Procedure: INSERTION PORT-A-CATH;  Surgeon: Currie Paris, MD;  Location: [facility];  Service: General;  Laterality: Right;  Porta Cath Placement    PORTACATH PLACEMENT  05/01/2012   Procedure: INSERTION PORT-A-CATH;  Surgeon: Currie Paris, MD;  Location:  SURGERY CENTER;  Service: General;  Laterality: Right;  right internal jugular port-a-cath  insertion   TEE WITHOUT CARDIOVERSION  03/06/2012   Procedure: TRANSESOPHAGEAL ECHOCARDIOGRAM (TEE);  Surgeon: Wendall Stade, MD;  Location: [facility];  Service: Cardiovascular;  Laterality: N/A;  Rm. [id number]   TONSILLECTOMY     TOTAL KNEE ARTHROPLASTY  08/15/2011   Procedure: TOTAL KNEE ARTHROPLASTY; lft Surgeon: Harvie Junior, MD;  Location: [facility];  Service: Orthopedics;  Laterality: Left;  RIGHT KNEE CORTIZONE INJECTION   TOTAL KNEE ARTHROPLASTY Left 08/18/2012   Procedure:  Irrigation and debridement of LEFT total knee;  Removal of total knee parts; Implant of spacers;  Surgeon: Nestor Lewandowsky, MD;  Location: [facility] OR;  Service: Orthopedics;  Laterality: Left;   TOTAL KNEE ARTHROPLASTY Right 08/12/2013   TOTAL KNEE ARTHROPLASTY Right 08/12/2013   Procedure: RIGHT TOTAL KNEE ARTHROPLASTY;  Surgeon: Nestor Lewandowsky, MD;  Location: [facility];  Service: Orthopedics;  Laterality: Right;   TOTAL KNEE REVISION Left 04/19/2013   Procedure: TOTAL KNEE REVISION AND REMOVAL CEMENT SPACER;  Surgeon: Nestor Lewandowsky, MD;  Location: [facility];  Service: Orthopedics;  Laterality: Left;   TRANSFORAMINAL LUMBAR INTERBODY FUSION (TLIF) WITH PEDICLE SCREW FIXATION 1 LEVEL Bilateral 03/12/2019   Procedure: Lumbar four-five Open transforaminal lumbar interbody fusion with Lumbar four-five laminectomy, bilateral Lumbar four-five Pedicle screw placement;  Surgeon: Jadene Pierini, MD;  Location: [facility];  Service: Neurosurgery;  Laterality: Bilateral;   TUMOR REMOVAL     FROM PELVIS AGE 26   Patient Active Problem List   Diagnosis Date Noted   Vitamin D deficiency 10/10/2022   Cerebral meningioma (HCC) 11/27/2020   Genetic testing 09/11/2020   Obesity (BMI 30-39.9) 07/14/2020   Meningioma (HCC) 06/16/2020   Arthritis 11/04/2019   Insomnia 08/22/2019   Lumbar radiculopathy 03/12/2019   Spondylolisthesis at L4-L5 level 08/24/2018   Facet syndrome, lumbar 08/24/2018   Chronic SI joint pain 03/30/2018   Delayed sleep  phase syndrome 03/30/2018   History of optic neuritis 07/19/2016   Hyperlipidemia 06/18/2015   H/O malignant neoplasm of breast 02/18/2015   Gonalgia 02/18/2015   Relapsing remitting multiple sclerosis (HCC) 02/18/2015   Physical exam 12/17/2014   Other fatigue 10/15/2014   Obstructive sleep apnea 10/15/2014   Restless leg syndrome 10/15/2014   Allergic rhinitis 09/30/2014   Memory loss 10/14/2013   Depression, recurrent (HCC) 08/30/2013   S/P total knee arthroplasty 08/23/2013   Hypothyroidism 05/29/2013   Esophageal reflux 05/29/2013   Hx of radiation therapy    Essential hypertension, benign 06/11/2012   Multiple sclerosis (HCC) 01/23/2012   Breast cancer of upper-outer quadrant of right female breast (HCC) 11/22/2011  Osteoarthritis of both knees 08/15/2011    PCP: Neena Rhymes REFERRING PROVIDER: Neena Rhymes  REFERRING DIAG:  L24.4W1U (ICD-10-CM) - Concussion without loss of consciousness, initial encounter  Post-concussive vertigo   THERAPY DIAG:  Dizziness and giddiness  Other abnormalities of gait and mobility  Post-concussion vertigo  ONSET DATE: 03/07/23  Rationale for Evaluation and Treatment: Rehabilitation  SUBJECTIVE:   SUBJECTIVE STATEMENT: Still very dizzy, headache tried to go to [employer] and got really dizzy  I slipped at a restaurant and hit my hit. I still get nauseous and dizzy, bright lights in grocery store also make me dizzy.   Pt accompanied by: self  PERTINENT HISTORY:  03/07/23--Patient is a 62 year old female with a history of hypertension, hypothyroid, MS, anemia and cancer who is presenting today after a fall.  Patient was walking into a restaurant and the floor was wet causing her to slip and fall forward hitting her face on the floor.  She denies any loss of consciousness.  She has no neck pain but does have significant facial pain and headache.  No vision changes.  No numbness tingling in the arms or legs or difficulty  walking.  She does not take any anticoagulation.  Only injuries to the face  03/13/23--Facial laceration- new.  Well approximated, sutures ready to be removed.  Removed w/o difficulty and pt tolerated well. Facial contusion- new.  Pt w/ hematoma of R orbital ridge.  No bony abnormality.  CT reviewed- no fracture present.  Encouraged ice or heat- whichever pt feels provides more relief. Concussion- new.  Pt continues to have headache, dizziness, and nausea.  Has Zofran to use.  Will start Meclizine prn and refer to Neurorehab.  Reviewed need for physical and cognitive rest.  Pt expressed understanding  PAIN:  Are you having pain? No  PRECAUTIONS: None  RED FLAGS: None   WEIGHT BEARING RESTRICTIONS: No  FALLS: Has patient fallen in last 6 months? Yes. Number of falls 1  LIVING ENVIRONMENT: Lives with: lives with their spouse Lives in: House/apartment Stairs: Yes: Internal: one flight steps; on right going up  PLOF: Independent  PATIENT GOALS: to feel better and stop feeling dizzy  OBJECTIVE:  Note: Objective measures were completed at Evaluation unless otherwise noted.  DIAGNOSTIC FINDINGS:  IMPRESSION: 1. No progression of left sphenoid wing and tentorial meningiomas, as described above. 2. 5 mm meningioma along the superior left frontal convexity, in retrospect stable. 3. Chronic white matter disease is stable. There are indistinct areas of more subtle and infiltrating T2 hyperintensity at the left frontal lobe, nonenhancing. Consider shorter term follow-up for this finding.    COGNITION: Overall cognitive status: Within functional limits for tasks assessed   SENSATION: WFL  POSTURE:  rounded shoulders and forward head  Cervical ROM:  all WNL   LOWER EXTREMITY MMT: 5/5 BLE    GAIT: Gait pattern: decreased stride length, ataxic, narrow BOS, poor foot clearance- Right, and poor foot clearance- Left Distance walked: in clinic distances Assistive device utilized:  None Level of assistance: Complete Independence Comments: slowed gait, she does not walk straight and tends to veer to the R    VESTIBULAR ASSESSMENT:   SYMPTOM BEHAVIOR:   Non-Vestibular symptoms: headaches and nausea/vomiting  Type of dizziness: Blurred Vision, Imbalance (Disequilibrium), Spinning/Vertigo, and Lightheadedness/Faint  Frequency: every day  Duration: a couple hours  Aggravating factors: Induced by position change: sit to stand and Worse outside or in busy environment  Relieving factors: lying supine, medication, closing eyes, and rest  Progression of symptoms: better  OCULOMOTOR EXAM:  Ocular Alignment: normal  Ocular ROM: No Limitations  Spontaneous Nystagmus: absent  Gaze-Induced Nystagmus: absent  Smooth Pursuits: intact  Saccades: intact   VESTIBULAR - OCULAR REFLEX:   Slow VOR: Comment: able to do about 5 reps before feeling nauseous   VOR Cancellation: Normal  FUNCTIONAL GAIT: 5 times sit to stand: 14.83s Functional gait assessment: 16/30   VESTIBULAR TREATMENT:                                                                                                   DATE: 04/05/23: rates dizziness at 6/10 today upon arrival and over last 24 hrs Therex:  Initially recumbent bike x 6 min, level 2.3 Reviewed seated ex from home program but patient with c/o increased nausea/dizziness, consistently with L head rotation and focused gaze Therefore performed BPPV assessment: + c/o increased dizziness with R Dix halpike positioning, therefore utilized Epley maneuver, upon reassessment pt with negative Sx.     EVAL 03/29/23  PATIENT EDUCATION: Education details: POC and HEP Person educated: Patient Education method: Explanation Education comprehension: verbalized understanding  HOME EXERCISE PROGRAM: Access Code: [email or web address] URL: [email or web address] Date: 03/29/2023 Prepared by: Cassie Freer  Exercises - Standing Gaze Stabilization with Two Near  Targets and Head Rotation  - 1 x daily - 7 x weekly - 2 sets - 10 reps - Seated Gaze Stabilization with Head Rotation  - 1 x daily - 7 x weekly - 2 sets - 10 reps - Seated VOR Cancellation  - 1 x daily - 7 x weekly - 2 sets - 10 reps - Standing Tandem Balance with Counter Support  - 1 x daily - 7 x weekly - 2 reps - 15 hold - Standing Single Leg Stance with Counter Support  - 1 x daily - 7 x weekly - 2 reps - 10 hold  GOALS: Goals reviewed with patient? Yes  SHORT TERM GOALS: Target date: 05/10/23 Patient will be independent with initial HEP. Goal status: INITIAL   LONG TERM GOALS: Target date: 06/21/23  Patient will be independent with advanced/ongoing HEP to improve outcomes and carryover.  Goal status: INITIAL  2.  Patient will be to read and look at her phone without dizziness Baseline: unable to do for more than a few mins  Goal status: INITIAL  3.  Patient will report 50% decrease in nausea and dizziness symptoms  Baseline: every day for hours at a time Goal status: INITIAL   4.  Patient will demonstrate at least  25/30 on FGA to improve gait stability and reduce risk for falls. Baseline: 16/30 Goal status: INITIAL   ASSESSMENT:  CLINICAL IMPRESSION: Patient is a 62 y.o. female who participated today in physical therapy treatment for post-concussive vertigo.  She continues to have symptoms of nausea, headache,  and dizziness on a daily basis. Assessed for BPV due to increased dizziness today with seated rotational head movements.  Advised to perform the seated VOR ex but for brief intervals and to maintain dizziness at current rating 6 or lower.  Stil c/o nausea dizziness  at end of today's session but retesting R post canal was clear.  Patient will benefit from PT to address her post concussive vertigo symptoms as well as her gait and balance to decrease her risk for falls. Reinforced again to her rest to allow her brain to heal somewhat.  OBJECTIVE IMPAIRMENTS: Abnormal  gait, decreased balance, difficulty walking, and dizziness.   ACTIVITY LIMITATIONS: stairs, locomotion level, and driving  PARTICIPATION LIMITATIONS: driving, community activity, and yard work  [facility] POTENTIAL: Good  CLINICAL DECISION MAKING: Stable/uncomplicated  EVALUATION COMPLEXITY: Low   PLAN:  PT FREQUENCY: 2x/week  PT DURATION: 12 weeks  PLANNED INTERVENTIONS: Therapeutic exercises, Therapeutic activity, Neuromuscular re-education, Balance training, Gait training, Patient/Family education, Self Care, Joint mobilization, Stair training, Vestibular training, Canalith repositioning, Cryotherapy, Moist heat, and Manual therapy  PLAN FOR NEXT SESSION: reassess BPPV, VOR exercises as tolerated, balance training, standing with head turns and progressing to do more multitasking    Gabreille Dardis L Jimmy Stipes, PT, DPT, OCS 04/05/2023, 12:40 PM

## 2023-04-07 ENCOUNTER — Telehealth: Payer: Self-pay

## 2023-04-07 NOTE — Telephone Encounter (Signed)
Transition Care Management Unsuccessful Follow-up Telephone Call  Date of discharge and from where:  [name] 9/17  Attempts:  1st Attempt  Reason for unsuccessful TCM follow-up call:  No answer/busy   [name] [place]  [facility], [facility] Guide, Phone: [phone] Website: [name]

## 2023-04-07 NOTE — Telephone Encounter (Signed)
Transition Care Management Unsuccessful Follow-up Telephone Call  Date of discharge and from where:  [name]  Attempts:  2nd Attempt  Reason for unsuccessful TCM follow-up call:  No answer/busy   [name]   [facility], [facility] Guide, Phone: [phone] Website: [name]

## 2023-04-10 ENCOUNTER — Ambulatory Visit: Payer: Medicare Other

## 2023-04-10 DIAGNOSIS — R42 Dizziness and giddiness: Secondary | ICD-10-CM

## 2023-04-10 DIAGNOSIS — R2689 Other abnormalities of gait and mobility: Secondary | ICD-10-CM | POA: Diagnosis not present

## 2023-04-10 NOTE — Therapy (Signed)
OUTPATIENT PHYSICAL THERAPY VESTIBULAR TREATMENT     Patient Name: Julie Ross MRN: [id number] DOB:Sep 14, 1960, 62 y.o., female Today's Date: 04/10/2023  END OF SESSION:  PT End of Session - 04/10/23 1526     Visit Number 3    Date for PT Re-Evaluation 06/21/23    Authorization Type [employer] Medicare    PT Start Time 1530    PT Stop Time 1615    PT Time Calculation (min) 45 min    Behavior During Therapy [facility] for tasks assessed/performed;Flat affect;Agitated               Past Medical History:  Diagnosis Date   Anemia    h/o - ? bld. transfusion - more recent- ?2013   Anxiety    uses xanax mainly for sleep   Arthritis    knees    Basal cell carcinoma 01/11/2005   rim left nostril-,mohs   BCC (basal cell carcinoma) 01/11/2005   right cheek mid-mohs   BCC (basal cell carcinoma) 09/12/2006   right forehead -mohs   BCC (basal cell carcinoma) 01/19/2016   right upper arm -[id number]   BCC (basal cell carcinoma) 02/18/2019   forehead-mohs   Breast cancer (HCC) 11/2011   lul/ER+PR+, x1 lymph node    Cancer (HCC)    squamous cell skin cancer x7   Complication of anesthesia    Depression    GERD (gastroesophageal reflux disease)    H/O bone density study 03/2011   H/O colonoscopy    H/O echocardiogram    last one 02/2013, due to chemotherapy   Heart burn    Hx of radiation therapy 04/17/12- 06/04/12   left chest wall, axilla, L supraclavicular fossa 4500 cGy, left mastectomy scar/chest wall 5940 cGy   Hypertension    Hypothyroidism    IBS (irritable bowel syndrome)    Meningioma (HCC) 08/2020   Multiple sclerosis (HCC)    Neuromuscular disorder (HCC)    MS TX WITH CYMBALTA    Personal history of chemotherapy    Personal history of radiation therapy    SCC (squamous cell carcinoma) 01/11/2005   chest mid   Sleep apnea    no CPAP   Squamous cell carcinoma of skin 01/11/2005   over left lip   Wears glasses    Past Surgical History:  Procedure Laterality Date    APPLICATION OF ROBOTIC ASSISTANCE FOR SPINAL PROCEDURE N/A 03/12/2019   Procedure: APPLICATION OF ROBOTIC ASSISTANCE FOR SPINAL PROCEDURE;  Surgeon: Jadene Pierini, MD;  Location: [facility] OR;  Service: Neurosurgery;  Laterality: N/A;   BREAST REDUCTION SURGERY Right 01/25/2021   Procedure: MAMMARY REDUCTION  (BREAST);  Surgeon: Glenna Fellows, MD;  Location: [facility];  Service: Plastics;  Laterality: Right;   CESAREAN SECTION     X2    COLONOSCOPY     ESOPHAGOGASTRODUODENOSCOPY     FLEXIBLE SIGMOIDOSCOPY     I & D KNEE WITH POLY EXCHANGE  03/02/2012   Procedure: IRRIGATION AND DEBRIDEMENT KNEE WITH POLY EXCHANGE;  Surgeon: Harvie Junior, MD;  Location: [facility] OR;  Service: Orthopedics;  Laterality: Left;  I&D of total knee with Possible Poly Exchange    JOINT REPLACEMENT  07/22/2011   left knee- multiple surgeries    KNEE ARTHROSCOPY     LT KNEE 04/2011   KNEE ARTHROSCOPY  06/20/1982   rt   MASTECTOMY     MASTECTOMY W/ SENTINEL NODE BIOPSY  12/19/2011   Procedure: MASTECTOMY WITH SENTINEL LYMPH NODE BIOPSY;  Surgeon: Currie Paris, MD;  Location: [facility] OR;  Service: General;  Laterality: Left;  left breast and left axilla    MOHS SURGERY     right face   PORT-A-CATH REMOVAL  03/06/2012   Procedure: REMOVAL PORT-A-CATH;  Surgeon: Adolph Pollack, MD;  Location: [facility] OR;  Service: General;  Laterality: N/A;   PORT-A-CATH REMOVAL Right 08/22/2012   Procedure: REMOVAL PORT-A-CATH;  Surgeon: Mariella Saa, MD;  Location: [facility] OR;  Service: General;  Laterality: Right;   PORTACATH PLACEMENT  01/16/2012   Procedure: INSERTION PORT-A-CATH;  Surgeon: Currie Paris, MD;  Location: [facility];  Service: General;  Laterality: Right;  Porta Cath Placement    PORTACATH PLACEMENT  05/01/2012   Procedure: INSERTION PORT-A-CATH;  Surgeon: Currie Paris, MD;  Location: [facility];  Service: General;  Laterality: Right;  right internal jugular  port-a-cath insertion   TEE WITHOUT CARDIOVERSION  03/06/2012   Procedure: TRANSESOPHAGEAL ECHOCARDIOGRAM (TEE);  Surgeon: Wendall Stade, MD;  Location: [facility] ENDOSCOPY;  Service: Cardiovascular;  Laterality: N/A;  Rm. [id number]   TONSILLECTOMY     TOTAL KNEE ARTHROPLASTY  08/15/2011   Procedure: TOTAL KNEE ARTHROPLASTY; lft Surgeon: Harvie Junior, MD;  Location: [facility] OR;  Service: Orthopedics;  Laterality: Left;  RIGHT KNEE CORTIZONE INJECTION   TOTAL KNEE ARTHROPLASTY Left 08/18/2012   Procedure:  Irrigation and debridement of LEFT total knee;  Removal of total knee parts; Implant of spacers;  Surgeon: Nestor Lewandowsky, MD;  Location: [facility] OR;  Service: Orthopedics;  Laterality: Left;   TOTAL KNEE ARTHROPLASTY Right 08/12/2013   TOTAL KNEE ARTHROPLASTY Right 08/12/2013   Procedure: RIGHT TOTAL KNEE ARTHROPLASTY;  Surgeon: Nestor Lewandowsky, MD;  Location: [facility] OR;  Service: Orthopedics;  Laterality: Right;   TOTAL KNEE REVISION Left 04/19/2013   Procedure: TOTAL KNEE REVISION AND REMOVAL CEMENT SPACER;  Surgeon: Nestor Lewandowsky, MD;  Location: [facility] OR;  Service: Orthopedics;  Laterality: Left;   TRANSFORAMINAL LUMBAR INTERBODY FUSION (TLIF) WITH PEDICLE SCREW FIXATION 1 LEVEL Bilateral 03/12/2019   Procedure: Lumbar four-five Open transforaminal lumbar interbody fusion with Lumbar four-five laminectomy, bilateral Lumbar four-five Pedicle screw placement;  Surgeon: Jadene Pierini, MD;  Location: [facility] OR;  Service: Neurosurgery;  Laterality: Bilateral;   TUMOR REMOVAL     FROM PELVIS AGE 80   Patient Active Problem List   Diagnosis Date Noted   Vitamin D deficiency 10/10/2022   Cerebral meningioma (HCC) 11/27/2020   Genetic testing 09/11/2020   Obesity (BMI 30-39.9) 07/14/2020   Meningioma (HCC) 06/16/2020   Arthritis 11/04/2019   Insomnia 08/22/2019   Lumbar radiculopathy 03/12/2019   Spondylolisthesis at L4-L5 level 08/24/2018   Facet syndrome, lumbar 08/24/2018   Chronic SI joint pain 03/30/2018    Delayed sleep phase syndrome 03/30/2018   History of optic neuritis 07/19/2016   Hyperlipidemia 06/18/2015   H/O malignant neoplasm of breast 02/18/2015   Gonalgia 02/18/2015   Relapsing remitting multiple sclerosis (HCC) 02/18/2015   Physical exam 12/17/2014   Other fatigue 10/15/2014   Obstructive sleep apnea 10/15/2014   Restless leg syndrome 10/15/2014   Allergic rhinitis 09/30/2014   Memory loss 10/14/2013   Depression, recurrent (HCC) 08/30/2013   S/P total knee arthroplasty 08/23/2013   Hypothyroidism 05/29/2013   Esophageal reflux 05/29/2013   Hx of radiation therapy    Essential hypertension, benign 06/11/2012   Multiple sclerosis (HCC) 01/23/2012   Breast cancer of upper-outer quadrant of right female breast (HCC) 11/22/2011  Osteoarthritis of both knees 08/15/2011    PCP: Neena Rhymes REFERRING PROVIDER: Neena Rhymes  REFERRING DIAG:  Q65.7Q4O (ICD-10-CM) - Concussion without loss of consciousness, initial encounter  Post-concussive vertigo   THERAPY DIAG:  Dizziness and giddiness  Other abnormalities of gait and mobility  Post-concussion vertigo  ONSET DATE: 03/07/23  Rationale for Evaluation and Treatment: Rehabilitation  SUBJECTIVE:   SUBJECTIVE STATEMENT: Still getting dizzy, not any better. Headaches are still happening.   I slipped at a restaurant and hit my hit. I still get nauseous and dizzy, bright lights in grocery store also make me dizzy.   Pt accompanied by: self  PERTINENT HISTORY:  03/07/23--Patient is a 62 year old female with a history of hypertension, hypothyroid, MS, anemia and cancer who is presenting today after a fall.  Patient was walking into a restaurant and the floor was wet causing her to slip and fall forward hitting her face on the floor.  She denies any loss of consciousness.  She has no neck pain but does have significant facial pain and headache.  No vision changes.  No numbness tingling in the arms or legs or  difficulty walking.  She does not take any anticoagulation.  Only injuries to the face  03/13/23--Facial laceration- new.  Well approximated, sutures ready to be removed.  Removed w/o difficulty and pt tolerated well. Facial contusion- new.  Pt w/ hematoma of R orbital ridge.  No bony abnormality.  CT reviewed- no fracture present.  Encouraged ice or heat- whichever pt feels provides more relief. Concussion- new.  Pt continues to have headache, dizziness, and nausea.  Has Zofran to use.  Will start Meclizine prn and refer to Neurorehab.  Reviewed need for physical and cognitive rest.  Pt expressed understanding  PAIN:  Are you having pain? No  PRECAUTIONS: None  RED FLAGS: None   WEIGHT BEARING RESTRICTIONS: No  FALLS: Has patient fallen in last 6 months? Yes. Number of falls 1  LIVING ENVIRONMENT: Lives with: lives with their spouse Lives in: House/apartment Stairs: Yes: Internal: one flight steps; on right going up  PLOF: Independent  PATIENT GOALS: to feel better and stop feeling dizzy  OBJECTIVE:  Note: Objective measures were completed at Evaluation unless otherwise noted.  DIAGNOSTIC FINDINGS:  IMPRESSION: 1. No progression of left sphenoid wing and tentorial meningiomas, as described above. 2. 5 mm meningioma along the superior left frontal convexity, in retrospect stable. 3. Chronic white matter disease is stable. There are indistinct areas of more subtle and infiltrating T2 hyperintensity at the left frontal lobe, nonenhancing. Consider shorter term follow-up for this finding.    COGNITION: Overall cognitive status: Within functional limits for tasks assessed   SENSATION: WFL  POSTURE:  rounded shoulders and forward head  Cervical ROM:  all WNL   LOWER EXTREMITY MMT: 5/5 BLE    GAIT: Gait pattern: decreased stride length, ataxic, narrow BOS, poor foot clearance- Right, and poor foot clearance- Left Distance walked: in clinic distances Assistive device  utilized: None Level of assistance: Complete Independence Comments: slowed gait, she does not walk straight and tends to veer to the R    VESTIBULAR ASSESSMENT:   SYMPTOM BEHAVIOR:   Non-Vestibular symptoms: headaches and nausea/vomiting  Type of dizziness: Blurred Vision, Imbalance (Disequilibrium), Spinning/Vertigo, and Lightheadedness/Faint  Frequency: every day  Duration: a couple hours  Aggravating factors: Induced by position change: sit to stand and Worse outside or in busy environment  Relieving factors: lying supine, medication, closing eyes, and rest  Progression of  symptoms: better  OCULOMOTOR EXAM:  Ocular Alignment: normal  Ocular ROM: No Limitations  Spontaneous Nystagmus: absent  Gaze-Induced Nystagmus: absent  Smooth Pursuits: intact  Saccades: intact   VESTIBULAR - OCULAR REFLEX:   Slow VOR: Comment: able to do about 5 reps before feeling nauseous   VOR Cancellation: Normal  FUNCTIONAL GAIT: 5 times sit to stand: 14.83s Functional gait assessment: 16/30   VESTIBULAR TREATMENT:                                                                                                   DATE: 04/10/23 NuStep L4 x28mins  Standing on airex, EO/EC in bars in low light  Head turns and nods on airex in low light  Walking on beam in low light  VOR x1 exercises seated x10 horizontal and vertical  Pencil push ups x10 Mal Amabile string 1 bead- very difficult Tandem standing in bars  Cone taps in bars     04/05/23: rates dizziness at 6/10 today upon arrival and over last 24 hrs Therex:  Initially recumbent bike x 6 min, level 2.3 Reviewed seated ex from home program but patient with c/o increased nausea/dizziness, consistently with L head rotation and focused gaze Therefore performed BPPV assessment: + c/o increased dizziness with R Dix halpike positioning, therefore utilized Epley maneuver, upon reassessment pt with negative Sx.     EVAL 03/29/23  PATIENT  EDUCATION: Education details: POC and HEP Person educated: Patient Education method: Explanation Education comprehension: verbalized understanding  HOME EXERCISE PROGRAM: Access Code: [email or web address] URL: [email or web address] Date: 03/29/2023 Prepared by: Cassie Freer  Exercises - Standing Gaze Stabilization with Two Near Targets and Head Rotation  - 1 x daily - 7 x weekly - 2 sets - 10 reps - Seated Gaze Stabilization with Head Rotation  - 1 x daily - 7 x weekly - 2 sets - 10 reps - Seated VOR Cancellation  - 1 x daily - 7 x weekly - 2 sets - 10 reps - Standing Tandem Balance with Counter Support  - 1 x daily - 7 x weekly - 2 reps - 15 hold - Standing Single Leg Stance with Counter Support  - 1 x daily - 7 x weekly - 2 reps - 10 hold  GOALS: Goals reviewed with patient? Yes  SHORT TERM GOALS: Target date: 05/10/23 Patient will be independent with initial HEP. Goal status: INITIAL   LONG TERM GOALS: Target date: 06/21/23  Patient will be independent with advanced/ongoing HEP to improve outcomes and carryover.  Goal status: INITIAL  2.  Patient will be to read and look at her phone without dizziness Baseline: unable to do for more than a few mins  Goal status: INITIAL  3.  Patient will report 50% decrease in nausea and dizziness symptoms  Baseline: every day for hours at a time Goal status: INITIAL   4.  Patient will demonstrate at least  25/30 on FGA to improve gait stability and reduce risk for falls. Baseline: 16/30 Goal status: INITIAL   ASSESSMENT:  CLINICAL IMPRESSION: Patient is a 62 y.o. female who participated today in  physical therapy treatment for post-concussive vertigo. She continues to have symptoms of nausea, headache,  and dizziness on a daily basis. She was able to tolerate some more activity today especially balance related. She is unsteady with interventions on foam surfaces but able to regain balance independently. VOR exercises are still hard  and increase her symptoms especially horizontal movements to the left. Difficulty with maintaining gaze on target. Very difficult trying to work with Luvenia Redden focusing on 1 bead. Patient will benefit from PT to address her post concussive vertigo symptoms as well as her gait and balance to decrease her risk for falls.  OBJECTIVE IMPAIRMENTS: Abnormal gait, decreased balance, difficulty walking, and dizziness.   ACTIVITY LIMITATIONS: stairs, locomotion level, and driving  PARTICIPATION LIMITATIONS: driving, community activity, and yard work  [facility] POTENTIAL: Good  CLINICAL DECISION MAKING: Stable/uncomplicated  EVALUATION COMPLEXITY: Low   PLAN:  PT FREQUENCY: 2x/week  PT DURATION: 12 weeks  PLANNED INTERVENTIONS: Therapeutic exercises, Therapeutic activity, Neuromuscular re-education, Balance training, Gait training, Patient/Family education, Self Care, Joint mobilization, Stair training, Vestibular training, Canalith repositioning, Cryotherapy, Moist heat, and Manual therapy  PLAN FOR NEXT SESSION: reassess BPPV, VOR exercises as tolerated, balance training, standing with head turns and progressing to do more multitasking    Cassie Freer, PT, DPT 04/10/2023, 4:17 PM

## 2023-04-11 ENCOUNTER — Ambulatory Visit: Payer: Medicare Other | Admitting: Family Medicine

## 2023-04-11 ENCOUNTER — Encounter: Payer: Self-pay | Admitting: Family Medicine

## 2023-04-11 VITALS — BP 148/78 | HR 47 | Temp 98.7°F | Ht 66.0 in | Wt 197.5 lb

## 2023-04-11 DIAGNOSIS — R001 Bradycardia, unspecified: Secondary | ICD-10-CM

## 2023-04-11 DIAGNOSIS — S060X0A Concussion without loss of consciousness, initial encounter: Secondary | ICD-10-CM

## 2023-04-11 DIAGNOSIS — Z23 Encounter for immunization: Secondary | ICD-10-CM

## 2023-04-11 DIAGNOSIS — E039 Hypothyroidism, unspecified: Secondary | ICD-10-CM

## 2023-04-11 DIAGNOSIS — E785 Hyperlipidemia, unspecified: Secondary | ICD-10-CM

## 2023-04-11 DIAGNOSIS — I10 Essential (primary) hypertension: Secondary | ICD-10-CM

## 2023-04-11 LAB — CBC WITH DIFFERENTIAL/PLATELET
Basophils Absolute: 0 10*3/uL (ref 0.0–0.1)
Basophils Relative: 1 % (ref 0.0–3.0)
Eosinophils Absolute: 0.2 10*3/uL (ref 0.0–0.7)
Eosinophils Relative: 3.6 % (ref 0.0–5.0)
HCT: 37.6 % (ref 36.0–46.0)
Hemoglobin: 12.1 g/dL (ref 12.0–15.0)
Lymphocytes Relative: 33.7 % (ref 12.0–46.0)
Lymphs Abs: 1.7 10*3/uL (ref 0.7–4.0)
MCHC: 32.2 g/dL (ref 30.0–36.0)
MCV: 77.4 fL — ABNORMAL LOW (ref 78.0–100.0)
Monocytes Absolute: 0.4 10*3/uL (ref 0.1–1.0)
Monocytes Relative: 7.8 % (ref 3.0–12.0)
Neutro Abs: 2.8 10*3/uL (ref 1.4–7.7)
Neutrophils Relative %: 53.9 % (ref 43.0–77.0)
Platelets: 232 10*3/uL (ref 150.0–400.0)
RBC: 4.86 Mil/uL (ref 3.87–5.11)
RDW: 15.4 % (ref 11.5–15.5)
WBC: 5.1 10*3/uL (ref 4.0–10.5)

## 2023-04-11 LAB — BASIC METABOLIC PANEL
BUN: 14 mg/dL (ref 6–23)
CO2: 27 meq/L (ref 19–32)
Calcium: 9.4 mg/dL (ref 8.4–10.5)
Chloride: 97 meq/L (ref 96–112)
Creatinine, Ser: 1.01 mg/dL (ref 0.40–1.20)
GFR: 58.79 mL/min — ABNORMAL LOW (ref >60.00)
Glucose, Bld: 93 mg/dL (ref 70–99)
Potassium: 3.6 meq/L (ref 3.5–5.1)
Sodium: 135 meq/L (ref 135–145)

## 2023-04-11 LAB — LIPID PANEL
Cholesterol: 165 mg/dL (ref 0–200)
HDL: 86.7 mg/dL (ref >39.00)
LDL Cholesterol: 67 mg/dL (ref 0–99)
NonHDL: 78.62
Total CHOL/HDL Ratio: 2
Triglycerides: 59 mg/dL (ref 0.0–149.0)
VLDL: 11.8 mg/dL (ref 0.0–40.0)

## 2023-04-11 LAB — TSH: TSH: 1.72 u[IU]/mL (ref 0.35–5.50)

## 2023-04-11 LAB — HEPATIC FUNCTION PANEL
ALT: 14 U/L (ref 0–35)
AST: 19 U/L (ref 0–37)
Albumin: 4.5 g/dL (ref 3.5–5.2)
Alkaline Phosphatase: 106 U/L (ref 39–117)
Bilirubin, Direct: 0.1 mg/dL (ref 0.0–0.3)
Total Bilirubin: 0.6 mg/dL (ref 0.2–1.2)
Total Protein: 7.3 g/dL (ref 6.0–8.3)

## 2023-04-11 NOTE — Progress Notes (Signed)
Subjective:    Patient ID: Julie Ross, female    DOB: Jan 09, 1961, 62 y.o.   MRN: [id number]  HPI HTN- chronic problem, on Losartan hydrochlorothiazide 100/25mg  daily.  No CP, SOB, edema.  Pt's HR today is consistently mid- upper 40s.  Hyperlipidemia- chronic problem, on Lipitor 20mg  daily.  No abd pain or vomiting (+ nausea from dizziness)  Hypothyroid- chronic problem, on Levothyroxine daily.  Decreased energy level, 'i just want to sleep all the time'.  Concussion- currently doing PT for post-concussive dizziness.  Pt reports she is not feeling any better.  Continues to have HA's, dizziness.  Some visual changes- was going to get a new pair of glasses prior to the accident but has held off as vision now seems different.  Sxs are occurring daily.  Does not have an upcoming appt w/ Neurology until Jan.   Review of Systems For ROS see HPI     Objective:   Physical Exam Vitals reviewed.  Constitutional:      General: She is not in acute distress.    Appearance: Normal appearance. She is well-developed.  HENT:     Head: Normocephalic and atraumatic.  Eyes:     Extraocular Movements: Extraocular movements intact.     Conjunctiva/sclera: Conjunctivae normal.     Pupils: Pupils are equal, round, and reactive to light.  Neck:     Thyroid: No thyromegaly.  Cardiovascular:     Rate and Rhythm: Regular rhythm. Bradycardia present.     Heart sounds: Normal heart sounds. No murmur heard. Pulmonary:     Effort: Pulmonary effort is normal. No respiratory distress.     Breath sounds: Normal breath sounds.  Abdominal:     General: There is no distension.     Palpations: Abdomen is soft.     Tenderness: There is no abdominal tenderness.  Musculoskeletal:     Cervical back: Normal range of motion and neck supple.  Lymphadenopathy:     Cervical: No cervical adenopathy.  Skin:    General: Skin is warm and dry.  Neurological:     Mental Status: She is alert and oriented to  person, place, and time.  Psychiatric:        Behavior: Behavior normal.     Comments: Flat affect           Assessment & Plan:  Concussion- ongoing issue.  Pt is 4-5 weeks out from original injury.  Continues to struggle w/ daily headaches, dizziness, nausea.  + visual changes.  She is doing PT to help w/ dizziness and balance but doesn't seem to be improving.  Will reach out to Dr Epimenio Foot- her neurologist- to alert him to what is going on and get her in for a sooner appt.  Pt expressed understanding and is in agreement w/ plan.   Symptomatic bradycardia- new.  Pt's last few HR readings have been in the upper 40s.  She reports prior to last year, HR was consistently around 60.  She is not on any rate modifying medications.  Suspect this is contributing to her dizziness and fatigue.  She has seen Dr Rennis Golden previously and I will reach out to get her an appt for complete evaluation and tx.  Pt expressed understanding and is in agreement w/ plan.

## 2023-04-11 NOTE — Assessment & Plan Note (Signed)
Ongoing issue for pt.  She is struggling w/ fatigue which is likely multifactorial- symptomatic bradycardia, post concussive syndrome, and possibly thyroid.  Check labs.  Adjust meds prn

## 2023-04-11 NOTE — Assessment & Plan Note (Signed)
Chronic problem.  Currently on Lipitor 20mg daily w/o difficulty.  Check labs.  Adjust meds prn  

## 2023-04-11 NOTE — Patient Instructions (Signed)
Follow up in 1 month to recheck blood pressure We'll notify you of your lab results and make any changes if needed I have reached out to Cardiology and Neuro to try and get appts ASAP- we'll let you know as soon as we hear anything Make sure you are drinking LOTS of water Limit your sodium intake Call with any questions or concerns Hang in there!!

## 2023-04-11 NOTE — Assessment & Plan Note (Signed)
Chronic problem.  BP is elevated today but I do not want to adjust her medications and risk lowering her HR even more.  She has seen Dr Rennis Golden in the past and will try and get her back in ASAP to address the symptomatic bradycardia before making any medication changes.  Pt expressed understanding and is in agreement w/ plan.

## 2023-04-12 ENCOUNTER — Ambulatory Visit: Payer: Medicare Other | Admitting: Neurology

## 2023-04-12 ENCOUNTER — Encounter: Payer: Self-pay | Admitting: Family Medicine

## 2023-04-12 ENCOUNTER — Ambulatory Visit (INDEPENDENT_AMBULATORY_CARE_PROVIDER_SITE_OTHER): Payer: Medicare Other

## 2023-04-12 ENCOUNTER — Encounter: Payer: Self-pay | Admitting: Neurology

## 2023-04-12 ENCOUNTER — Encounter: Payer: Self-pay | Admitting: Student

## 2023-04-12 ENCOUNTER — Telehealth: Payer: Self-pay

## 2023-04-12 ENCOUNTER — Ambulatory Visit: Payer: Medicare Other | Attending: Student | Admitting: Student

## 2023-04-12 VITALS — BP 132/84 | HR 56 | Ht 66.0 in | Wt 198.4 lb

## 2023-04-12 VITALS — BP 139/60 | HR 53 | Ht 66.0 in | Wt 197.0 lb

## 2023-04-12 DIAGNOSIS — I2510 Atherosclerotic heart disease of native coronary artery without angina pectoris: Secondary | ICD-10-CM | POA: Diagnosis not present

## 2023-04-12 DIAGNOSIS — E785 Hyperlipidemia, unspecified: Secondary | ICD-10-CM

## 2023-04-12 DIAGNOSIS — R269 Unspecified abnormalities of gait and mobility: Secondary | ICD-10-CM | POA: Diagnosis not present

## 2023-04-12 DIAGNOSIS — G4721 Circadian rhythm sleep disorder, delayed sleep phase type: Secondary | ICD-10-CM | POA: Diagnosis not present

## 2023-04-12 DIAGNOSIS — G4733 Obstructive sleep apnea (adult) (pediatric): Secondary | ICD-10-CM

## 2023-04-12 DIAGNOSIS — I10 Essential (primary) hypertension: Secondary | ICD-10-CM | POA: Diagnosis not present

## 2023-04-12 DIAGNOSIS — D329 Benign neoplasm of meninges, unspecified: Secondary | ICD-10-CM | POA: Diagnosis not present

## 2023-04-12 DIAGNOSIS — R5383 Other fatigue: Secondary | ICD-10-CM | POA: Diagnosis not present

## 2023-04-12 DIAGNOSIS — Z853 Personal history of malignant neoplasm of breast: Secondary | ICD-10-CM | POA: Diagnosis not present

## 2023-04-12 DIAGNOSIS — R110 Nausea: Secondary | ICD-10-CM

## 2023-04-12 DIAGNOSIS — G35 Multiple sclerosis: Secondary | ICD-10-CM | POA: Diagnosis not present

## 2023-04-12 DIAGNOSIS — R001 Bradycardia, unspecified: Secondary | ICD-10-CM | POA: Diagnosis not present

## 2023-04-12 DIAGNOSIS — R112 Nausea with vomiting, unspecified: Secondary | ICD-10-CM

## 2023-04-12 DIAGNOSIS — I340 Nonrheumatic mitral (valve) insufficiency: Secondary | ICD-10-CM | POA: Diagnosis not present

## 2023-04-12 DIAGNOSIS — F0781 Postconcussional syndrome: Secondary | ICD-10-CM

## 2023-04-12 MED ORDER — NORTRIPTYLINE HCL 25 MG PO CAPS: 25.0000 mg | ORAL_CAPSULE | Freq: Every day | ORAL | 3 refills | Status: DC

## 2023-04-12 MED ORDER — ONDANSETRON HCL 4 MG PO TABS: 4.0000 mg | ORAL_TABLET | Freq: Three times a day (TID) | ORAL | 0 refills | Status: DC | PRN

## 2023-04-12 MED ORDER — METHYLPREDNISOLONE 4 MG PO TABS: ORAL_TABLET | ORAL | 0 refills | Status: DC

## 2023-04-12 NOTE — Telephone Encounter (Signed)
-----   Message from Neena Rhymes sent at 04/12/2023 11:41 AM EDT ----- Labs are stable and look good!

## 2023-04-12 NOTE — Progress Notes (Unsigned)
Enrolled patient for a 7 day Zio XT monitor to be mailed to patients home   Hilty to read

## 2023-04-12 NOTE — Progress Notes (Signed)
[facility]  PATIENT: Julie Ross DOB: [birth year]  REFERRING DOCTOR OR PCP:  Neena Rhymes SOURCE: patient and records from [facility]  _________________________________   HISTORICAL  CHIEF COMPLAINT:  Chief Complaint  Patient presents with   Room 10    Pt is here with her Husband. Pt states that she might have blacked out for a second when she fell September 17th and hit her head. Pt states that since her fall she has been having headaches, and dizziness. Pt states that she has some nausea. Pt states that she has had blurry vision. Pt states that she looses track when she is in a conversation and she can't find the right words. Pt states that her balance has been off since her fall. Pt states that bright lights and loud noises make her lightheaded and    HISTORY OF PRESENT ILLNESS:  Julie Ross is a 62 y.o. woman with multiple sclerosis and breast cancer.    Update 04/11/2022: She fell on a wet surface 03/07/2023 and slammed her head hitting the right lateral forehead.  She had a large bruise and laceration.  She had several seconds of LOC.   Right away, she had a headache and nausea.   She was taken to the [facility] ED.   CT scan showed no intracranial acute findings but showed right facial swelling.  Maxillofacial bones were ok.     MRI brain also showed a left frontal lobe T2 hyperintensity that seems larger than previous year (also present but less obvious in 2022)   She continues to experience a HA and has some lateral right forehead swelling.  The HA is dull.  She has had some positional vertigo - fine at rest but more a problem when she gets up and walks.   When dizziness is worse she also has nausea.   No recent vomiting (had the day of injury).   She notes vision seems more blurry.    Bright lights are bothersome.   She feels cognitively foggy.  She is sleeping ok at night.     The brain MRI showed that the meningiomas were stable.  A more  diffuse appearing focus in the left frontal lobe was more apparent on the current scan compared to the scan from last year.  This is nonspecific and could be due to her known MS though other etiology needs to also be considered such as low-grade glioma and she reports that a follow-up MRI has been scheduled for 6 months.  MRI Brain 03/23/2023 (routine for meningioma f/u -- Dr. Arsenio Loader).  1. No progression of left sphenoid wing and tentorial meningiomas, as described above. 2. 5 mm meningioma along the superior left frontal convexity, in retrospect stable. 3. Chronic white matter disease is stable. There are indistinct areas of more subtle and infiltrating T2 hyperintensity at the left frontal lobe, nonenhancing. Consider shorter term follow-up for this finding.   Her multiple sclerosis seems stable.  She has been off a DMT (she had been on Chemo for breast cancer in 2013/2014 and stopped around that time.   .  She has no recent exacerbations.  She continues to note some progression with reduced gait and balance.  She does not note much weakness or dysesthesias.  Bladder is doing well.  MS lesions on the MRI of the brain 10/01/2020 was unchanged compared to the MRI 06/16/2020 and 04/13/2018 MRI is also showed meningiomas that have increased in size.   She also has meningiomas and  sees Dr. Arsenio Loader.  She had stereotactic radiosurgery 10/06/2020 for the meningiomas (left sphenoid wing and left tentorial)   She sees Dr. Arsenio Loader again in October  She notes the gait and balance are stable.    She has stumbles but not falls.    She has trouble with stairs and uses the bannister.  She denies numbness.    Bladder function is unchanged.        She has noted more fatigue.   Sleep is variable at night.  She often takes a short nap after getting up at 830.  She then lays down on the couch and dozes off again.    Due to more fatigue.  We did a PSG.  It showed minimal OSA with an AHI of 2.5.  However, she had moderate  REM OSA but very little REM sleep was recorded.  Insomnia is better with clonazepam and this helps her sleep through the night better than Xanax did.      Provigil caused her to feel jittery and phentermine caused stomach upset n the past.    She appears to have delayed sleep phase and goes to bed at 130 am but sets alarm for 830 a.m. to let dogs out (she lets them out at 10 pm)   we discussed matching up her and her dogs schedule more to allow her to have 8+ hours of sleep.      She had breast cancer in 2013.   She was seeing Dr. Pamelia Hoit.   She has no recurrence and is off all medications for it now.   She had neurosurgery at [facility]5 (had synovial cyst and spondylolisthesis; Dr. Maurice Small).   Back is doing better.   Polysomnogram 11/21/2022showed: Negligible overall Obstructive Sleep Apnea with AHI = 2.5.  However, there was moderate REM related OSA with a REM AHI = 22. No significant PLMS Delayed sleep latency.  Normal sleep efficiency. All stages of sleep were recorded.   MS History:    She was diagnosed with MS in 1999 after presenting with optic neuritis.   She had a spinal tap, MRI and angiogram.   Initially, the diagnosis was uncertain but she had changes in the MRI over time leading to a diagnosis of MS a few years later.    Initially, she was placed on Copaxone, then switched to Avonex due to skin reactions.   She then switched to Tysabri when she had some mild breakthrough disease. She tolerated Tysabri very well and her MS did well.  However, she was JCV antibody positive so she stopped in 2012.   She did not restart Avonex as she did not want to go back to an injection.    Her last exacerbation was optic neuritis in 2012 treated with steroids. She proved but did not get back to baseline.  This exacerbation occurred off any DMT.  She tried Gilenya in 2013. She had trouble tolerating Gilenya and discontinued shortly after starting it. She was going to start Tecfidera but was diagnosed with breast  cancer in 2013 and opted not to start at that time. She received chemotherapy including Taxotere, carboplatin, Herceptin in 2013 and 2014. An MRI of the brain in 2015 showed only mild MS progression compared to her previous one in 2011.   She stopped disease modifying therapies in 2014 and 2015.  Subsequent MRIs have been stable.  IMAGING: MRI of the brain 04/13/2018 showed multiple T2/FLAIR hyperintense foci in the brainstem, cerebellum and hemispheres in a pattern and  configuration consistent with demyelinating plaque associated with multiple sclerosis.  One focus in the right splenium was not present on the 2017 MRI and shows some enhancement without edema consistent with a subacute demyelinating plaque.  Other foci were present on the 2017 MRI.     Two meningeal based foci (left sphenoid wing and left tentorium) as described above consistent with meningiomas.  They are described as the same size on the 2018 MRI report.   Complex stable pineal cyst  MRI of the brain 06/16/2020 showed stable MS lesions.  These meningiomas had increased in size compared to 2019  MRI of the brain 10/01/2020 showed no further changes compared to 06/16/2020  MRI of the brain 03/23/2023 showed:   1. No progression of left sphenoid wing and tentorial meningiomas, as described above.    2. A 5 mm meningioma along the superior left frontal convexity, in  retrospect stable. 3. Chronic white matter disease is stable. There are indistinct areas of more subtle and infiltrating T2 hyperintensity at the left frontal lobe, nonenhancing. Consider shorter term follow-up for this finding.   REVIEW OF SYSTEMS: Constitutional: No fevers, chills, sweats, or change in appetite.  Notes fatigue Eyes: No visual changes, double vision, eye pain Ear, nose and throat: No hearing loss, ear pain, nasal congestion, sore throat.  Recent URI Cardiovascular: No chest pain, palpitations Respiratory:  No shortness of breath at rest or with exertion.    No wheezes.  Has OSA GastrointestinaI: No nausea, vomiting, diarrhea, abdominal pain, fecal incontinence Genitourinary:  No dysuria, urinary retention or frequency.  She has 1 times nocturia most nights Musculoskeletal:  No neck pain, back pain.  She has bilateral knee pain Integumentary: No rash, pruritus, skin lesions Neurological: as above Psychiatric: No depression at this time.  Some anxiety Endocrine: No palpitations, diaphoresis, change in appetite, change in weigh or increased thirst Hematologic/Lymphatic:  No anemia, purpura, petechiae. Allergic/Immunologic: No itchy/runny eyes, nasal congestion, recent allergic reactions, rashes  ALLERGIES: Allergies  Allergen Reactions   Bee Venom Anaphylaxis   Fentanyl Other (See Comments)    Very low BP, decrease LOC-received Narcan-in 2020    HOME MEDICATIONS:  Current Outpatient Medications:    Ascorbic Acid (VITAMIN C PO), Take by mouth., Disp: , Rfl:    aspirin EC 81 MG tablet, Take 1 tablet (81 mg total) by mouth daily. Swallow whole., Disp: 90 tablet, Rfl: 3   atorvastatin (LIPITOR) 20 MG tablet, TAKE 1 TABLET BY MOUTH ONCE  DAILY, Disp: 100 tablet, Rfl: 2   baclofen (LIORESAL) 10 MG tablet, TAKE 1 TABLET BY MOUTH 3 TIMES  DAILY AS NEEDED FOR MUSCLE  SPASM(S), Disp: 270 tablet, Rfl: 3   buPROPion (WELLBUTRIN XL) 150 MG 24 hr tablet, TAKE 1 TABLET BY MOUTH DAILY, Disp: 90 tablet, Rfl: 3   clonazePAM (KLONOPIN) 1 MG tablet, TAKE 1 TABLET BY MOUTH AT  BEDTIME, Disp: 90 tablet, Rfl: 1   EPINEPHrine 0.3 mg/0.3 mL IJ SOAJ injection, Inject 0.3 mg into the muscle as needed for anaphylaxis., Disp: 1 each, Rfl: 3   gabapentin (NEURONTIN) 600 MG tablet, TAKE 1 TABLET BY MOUTH 3 TIMES  DAILY, Disp: 300 tablet, Rfl: 1   HYDROcodone-acetaminophen (NORCO/VICODIN) 5-325 MG tablet, , Disp: , Rfl:    levothyroxine (SYNTHROID) 88 MCG tablet, TAKE 1 TABLET BY MOUTH DAILY  BEFORE BREAKFAST, Disp: 100 tablet, Rfl: 2   losartan-hydrochlorothiazide  (HYZAAR) 100-25 MG tablet, TAKE 1 TABLET BY MOUTH EVERY  MORNING BEFORE BREAKFAST (Patient taking differently: TAKE 1  TABLET BY MOUTH EVERY  MORNING BEFORE BREAKFAST), Disp: 100 tablet, Rfl: 2   meclizine (ANTIVERT) 25 MG tablet, Take 1 tablet (25 mg total) by mouth 3 (three) times daily as needed for dizziness., Disp: 45 tablet, Rfl: 0   methylPREDNISolone (MEDROL) 4 MG tablet, Taper from 6 pills po for one day to 1 pill po the last day over 6 days, Disp: 21 tablet, Rfl: 0   nortriptyline (PAMELOR) 25 MG capsule, Take 1 capsule (25 mg total) by mouth at bedtime., Disp: 30 capsule, Rfl: 3   omeprazole (PRILOSEC) 40 MG capsule, TAKE 1 CAPSULE BY MOUTH DAILY, Disp: 100 capsule, Rfl: 2   potassium chloride SA (KLOR-CON M) 20 MEQ tablet, TAKE 1 TABLET BY MOUTH DAILY, Disp: 100 tablet, Rfl: 2   sertraline (ZOLOFT) 100 MG tablet, TAKE 1 TABLET BY MOUTH DAILY, Disp: 90 tablet, Rfl: 3   ondansetron (ZOFRAN) 4 MG tablet, Take 1 tablet (4 mg total) by mouth every 8 (eight) hours as needed for nausea or vomiting., Disp: 20 tablet, Rfl: 0  PAST MEDICAL HISTORY: Past Medical History:  Diagnosis Date   Anemia    h/o - ? bld. transfusion - more recent- ?2013   Anxiety    uses xanax mainly for sleep   Arthritis    knees    Basal cell carcinoma 01/11/2005   rim left nostril-,mohs   BCC (basal cell carcinoma) 01/11/2005   right cheek mid-mohs   BCC (basal cell carcinoma) 09/12/2006   right forehead -mohs   BCC (basal cell carcinoma) 01/19/2016   right upper arm -[id number]   BCC (basal cell carcinoma) 02/18/2019   forehead-mohs   Breast cancer (HCC) 11/2011   lul/ER+PR+, x1 lymph node    Cancer (HCC)    squamous cell skin cancer x7   Complication of anesthesia    Depression    GERD (gastroesophageal reflux disease)    H/O bone density study 03/2011   H/O colonoscopy    H/O echocardiogram    last one 02/2013, due to chemotherapy   Heart burn    Hx of radiation therapy 04/17/12- 06/04/12   left chest  wall, axilla, L supraclavicular fossa 4500 cGy, left mastectomy scar/chest wall 5940 cGy   Hypertension    Hypothyroidism    IBS (irritable bowel syndrome)    Meningioma (HCC) 08/2020   Multiple sclerosis (HCC)    Neuromuscular disorder (HCC)    MS TX WITH CYMBALTA    Personal history of chemotherapy    Personal history of radiation therapy    SCC (squamous cell carcinoma) 01/11/2005   chest mid   Sleep apnea    no CPAP   Squamous cell carcinoma of skin 01/11/2005   over left lip   Wears glasses     PAST SURGICAL HISTORY: Past Surgical History:  Procedure Laterality Date   APPLICATION OF ROBOTIC ASSISTANCE FOR SPINAL PROCEDURE N/A 03/12/2019   Procedure: APPLICATION OF ROBOTIC ASSISTANCE FOR SPINAL PROCEDURE;  Surgeon: Jadene Pierini, MD;  Location: [facility] OR;  Service: Neurosurgery;  Laterality: N/A;   BREAST REDUCTION SURGERY Right 01/25/2021   Procedure: MAMMARY REDUCTION  (BREAST);  Surgeon: Glenna Fellows, MD;  Location: [facility];  Service: Plastics;  Laterality: Right;   CESAREAN SECTION     X2    COLONOSCOPY     ESOPHAGOGASTRODUODENOSCOPY     FLEXIBLE SIGMOIDOSCOPY     I & D KNEE WITH POLY EXCHANGE  03/02/2012   Procedure: IRRIGATION AND DEBRIDEMENT KNEE WITH POLY EXCHANGE;  Surgeon: Harvie Junior, MD;  Location: [facility] OR;  Service: Orthopedics;  Laterality: Left;  I&D of total knee with Possible Poly Exchange    JOINT REPLACEMENT  07/22/2011   left knee- multiple surgeries    KNEE ARTHROSCOPY     LT KNEE 04/2011   KNEE ARTHROSCOPY  06/20/1982   rt   MASTECTOMY     MASTECTOMY W/ SENTINEL NODE BIOPSY  12/19/2011   Procedure: MASTECTOMY WITH SENTINEL LYMPH NODE BIOPSY;  Surgeon: Currie Paris, MD;  Location: [facility] OR;  Service: General;  Laterality: Left;  left breast and left axilla    MOHS SURGERY     right face   PORT-A-CATH REMOVAL  03/06/2012   Procedure: REMOVAL PORT-A-CATH;  Surgeon: Adolph Pollack, MD;  Location: [facility] OR;  Service:  General;  Laterality: N/A;   PORT-A-CATH REMOVAL Right 08/22/2012   Procedure: REMOVAL PORT-A-CATH;  Surgeon: Mariella Saa, MD;  Location: [facility] OR;  Service: General;  Laterality: Right;   PORTACATH PLACEMENT  01/16/2012   Procedure: INSERTION PORT-A-CATH;  Surgeon: Currie Paris, MD;  Location: [facility];  Service: General;  Laterality: Right;  Porta Cath Placement    PORTACATH PLACEMENT  05/01/2012   Procedure: INSERTION PORT-A-CATH;  Surgeon: Currie Paris, MD;  Location:  SURGERY CENTER;  Service: General;  Laterality: Right;  right internal jugular port-a-cath insertion   TEE WITHOUT CARDIOVERSION  03/06/2012   Procedure: TRANSESOPHAGEAL ECHOCARDIOGRAM (TEE);  Surgeon: Wendall Stade, MD;  Location[facility];  Service: Cardiovascular;  Laterality: N/A;  Rm. [id number]   TONSILLECTOMY     TOTAL KNEE ARTHROPLASTY  08/15/2011   Procedure: TOTAL KNEE ARTHROPLASTY; lft Surgeon: Harvie Junior, MD;  Location: [facility] OR;  Service: Orthopedics;  Laterality: Left;  RIGHT KNEE CORTIZONE INJECTION   TOTAL KNEE ARTHROPLASTY Left 08/18/2012   Procedure:  Irrigation and debridement of LEFT total knee;  Removal of total knee parts; Implant of spacers;  Surgeon: Nestor Lewandowsky, MD;  Location: [facility] OR;  Service: Orthopedics;  Laterality: Left;   TOTAL KNEE ARTHROPLASTY Right 08/12/2013   TOTAL KNEE ARTHROPLASTY Right 08/12/2013   Procedure: RIGHT TOTAL KNEE ARTHROPLASTY;  Surgeon: Nestor Lewandowsky, MD;  Location: [facility] OR;  Service: Orthopedics;  Laterality: Right;   TOTAL KNEE REVISION Left 04/19/2013   Procedure: TOTAL KNEE REVISION AND REMOVAL CEMENT SPACER;  Surgeon: Nestor Lewandowsky, MD;  Location: [facility] OR;  Service: Orthopedics;  Laterality: Left;   TRANSFORAMINAL LUMBAR INTERBODY FUSION (TLIF) WITH PEDICLE SCREW FIXATION 1 LEVEL Bilateral 03/12/2019   Procedure: Lumbar four-five Open transforaminal lumbar interbody fusion with Lumbar four-five laminectomy, bilateral Lumbar  four-five Pedicle screw placement;  Surgeon: Jadene Pierini, MD;  Location: [facility] OR;  Service: Neurosurgery;  Laterality: Bilateral;   TUMOR REMOVAL     FROM PELVIS AGE 2    FAMILY HISTORY: Family History  Problem Relation Age of Onset   Arthritis Mother    Hypertension Mother    Heart disease Mother        CHF   Dementia Father    Diabetes type II Father    Heart disease Maternal Grandfather    Breast cancer Paternal Grandmother        dx in her 59s   Lung cancer Maternal Uncle        smoker   Breast cancer Paternal Aunt        dx in her 44s   Ovarian cancer Paternal Aunt  dx in her 30s   Colon cancer Neg Hx    Stomach cancer Neg Hx    Esophageal cancer Neg Hx     SOCIAL HISTORY:  Social History   Socioeconomic History   Marital status: Married    Spouse name: Not on file   Number of children: 2   Years of education: Not on file   Highest education level: Bachelor's degree (e.g., BA, AB, BS)  Occupational History    Employer: [employer]    Occupation: disabled  Tobacco Use   Smoking status: Never   Smokeless tobacco: Never  Vaping Use   Vaping status: Never Used  Substance and Sexual Activity   Alcohol use: No   Drug use: No   Sexual activity: Yes    Birth control/protection: Post-menopausal  Other Topics Concern   Not on file  Social History Narrative   Right Handed   1 cup of Tea per Day   Social Determinants of Health   Financial Resource Strain: Low Risk  (04/07/2023)   Overall Financial Resource Strain (CARDIA)    Difficulty of Paying Living Expenses: Not hard at all  Food Insecurity: No Food Insecurity (04/07/2023)   Hunger Vital Sign    Worried About Running Out of Food in the Last Year: Never true    Ran Out of Food in the Last Year: Never true  Transportation Needs: No Transportation Needs (04/07/2023)   PRAPARE - Administrator, Civil Service (Medical): No    Lack of Transportation (Non-Medical): No  Physical  Activity: Insufficiently Active (04/07/2023)   Exercise Vital Sign    Days of Exercise per Week: 3 days    Minutes of Exercise per Session: 30 min  Stress: Stress Concern Present (04/07/2023)   [employer] of Occupational Health - Occupational Stress Questionnaire    Feeling of Stress : To some extent  Social Connections: Moderately Integrated (04/07/2023)   Social Connection and Isolation Panel [NHANES]    Frequency of Communication with Friends and Family: More than three times a week    Frequency of Social Gatherings with Friends and Family: More than three times a week    Attends Religious Services: Never    Database administrator or Organizations: Yes    Attends Engineer, structural: More than 4 times per year    Marital Status: Married  Catering manager Violence: Not At Risk (12/28/2022)   Humiliation, Afraid, Rape, and Kick questionnaire    Fear of Current or Ex-Partner: No    Emotionally Abused: No    Physically Abused: No    Sexually Abused: No     PHYSICAL EXAM  Vitals:   04/12/23 1005  BP: 139/60  Pulse: (!) 53  Weight: 197 lb (89.4 kg)  Height: 5\' 6"  (1.676 m)    Body mass index is 31.8 kg/m.   General: The patient is well-developed and well-nourished and in no acute distress.  She has mild swelling but no current bruising at the right lateral forehead.  There is mild tenderness.  Funduscopic examination showed normal optic discs and retinal vessels.  Neurologic Exam  Mental status: The patient is alert and oriented x 3 at the time of the examination. The patient has apparent normal recent and remote memory, with an apparently normal attention span and concentration ability.   Speech is normal.  Cranial nerves: Extraocular movements are full.  Facial strength and sensation is normal. The tongue is midline, and the patient has symmetric elevation  of the soft palate. No obvious hearing deficits are noted.  Motor:  Muscle bulk is normal.   Muscle  tone is normal.  Strength appears to be normal in the legs except right EHL (4/5)  Sensory: Intact sensation to touch and vibration in legs.    Coordination: Finger-nose-finger is performed well...  Gait and station: Station is normal.   The gait is mildly wide.  Tandem gait is wide.   The Romberg is negative.  Reflexes: Deep tendon reflexes are symmetric and normal bilaterally in arms.  DTRs are 1 at the knees.  Deep tendon reflexes absent at the ankles. ____________________________________________________  ASSESSMENT AND PLAN  Multiple sclerosis (HCC)  Postconcussive syndrome  Meningioma (HCC)  OSA (obstructive sleep apnea)  History of breast cancer  Other fatigue  Gait disturbance  Delayed sleep phase syndrome   1.   Her MS is stable and she will continue off of the disease modifying therapy.  It is likely she has had benefit from having chemotherapy for her breast cancer.  Additionally, most people with MS in the mid 60s are able to stop therapy. 2.   For the postconcussive syndrome I will add nortriptyline and have her do a steroid pack.  If no benefit from nortriptyline, consider adding zonisamide 3.   Continue  clonazepam for insomnia.    4.  She is followed for her meningiomas by neuro-oncology.  Recent surveillance MRI showed possible enlargement of focus in the left frontal subcortical white matter.  This could be related to her known MS though this has clearly progressed from 20 22-20 23 to the present which would be unusual for MS.  This has been further followed.  If it enlarges she may need biopsy or the study.   5.  She will return to see [place] in 3 months or sooner if she has new or worsening neurologic symptoms.    43-minute office visit with the majority of the time spent face-to-face for history and physical, discussion/counseling and decision-making.  Additional time with record review and documentation.  This visit is part of a comprehensive longitudinal care  medical relationship regarding the patients primary diagnosis of multiple sclerosis and related concerns.   Ismael Treptow A. Epimenio Foot, MD, PhD 04/12/2023, 12:37 PM Certified in Neurology, Clinical Neurophysiology, Sleep Medicine, Pain Medicine and Neuroimaging  [facility] [address] [place], [place] [address] [phone]

## 2023-04-12 NOTE — Patient Instructions (Signed)
Medication Instructions:  The current medical regimen is effective;  continue present plan and medications as directed. Please refer to the Current Medication list given to you today.  *If you need a refill on your cardiac medications before your next appointment, please call your pharmacy*  Lab Work: MAG TODAY If you have labs (blood work) drawn today and your tests are completely normal, you will receive your results only by:    MyChart Message (if you have MyChart) OR  A paper copy in the mail If you have any lab test that is abnormal or we need to change your treatment, we will call you to review the results.  Testing/Procedures: Your physician has requested you wear a ZIO patch monitor for 7 days.   Follow-Up: At [facility], you and your health needs are our priority.  As part of our continuing mission to provide you with exceptional heart care, we have created designated Provider Care Teams.  These Care Teams include your primary Cardiologist (physician) and Advanced Practice Providers (APPs -  Physician Assistants and Nurse Practitioners) who all work together to provide you with the care you need, when you need it.  Your next appointment:   12 month(s)  Provider:   Chrystie Nose, MD  or Marjie Skiff, PA-C        ZIO XT- Long Term Monitor Instructions  Your physician has requested you wear a ZIO patch monitor for 7 days.  This is a single patch monitor. [employer] supplies one patch monitor per enrollment. Additional stickers are not available. Please do not apply patch if you will be having a Nuclear Stress Test,  Echocardiogram, Cardiac CT, MRI, or Chest Xray during the period you would be wearing the  monitor. The patch cannot be worn during these tests. You cannot remove and re-apply the  ZIO XT patch monitor.  Your ZIO patch monitor will be mailed 3 day USPS to your address on file. It may take 3-5 days  to receive your monitor after you have been enrolled.   Once you have received your monitor, please review the enclosed instructions. Your monitor  has already been registered assigning a specific monitor serial # to you.  Billing and Patient Assistance Program Information  We have supplied [employer] with any of your insurance information on file for billing purposes. [employer] offers a sliding scale Patient Assistance Program for patients that do not have  insurance, or whose insurance does not completely cover the cost of the ZIO monitor.  You must apply for the Patient Assistance Program to qualify for this discounted rate.  To apply, please call [employer] at [phone], select option 4, select option 2, ask to apply for  Patient Assistance Program. Meredeth Ide will ask your household income, and how many people  are in your household. They will quote your out-of-pocket cost based on that information.  [employer] will also be able to set up a 48-month, interest-free payment plan if needed.  Applying the monitor   Shave hair from upper left chest.  Hold abrader disc by orange tab. Rub abrader in 40 strokes over the upper left chest as  indicated in your monitor instructions.  Clean area with 4 enclosed alcohol pads. Let dry.  Apply patch as indicated in monitor instructions. Patch will be placed under collarbone on left  side of chest with arrow pointing upward.  Rub patch adhesive wings for 2 minutes. Remove white label marked "1". Remove the white  label marked "2". Rub patch  adhesive wings for 2 additional minutes.  While looking in a mirror, press and release button in center of patch. A small green light will  flash 3-4 times. This will be your only indicator that the monitor has been turned on.  Do not shower for the first 24 hours. You may shower after the first 24 hours.  Press the button if you feel a symptom. You will hear a small click. Record Date, Time and  Symptom in the Patient Logbook.  When you are ready to remove the patch, follow  instructions on the last 2 pages of Patient  Logbook. Stick patch monitor onto the last page of Patient Logbook.  Place Patient Logbook in the blue and white box. Use locking tab on box and tape box closed  securely. The blue and white box has prepaid postage on it. Please place it in the mailbox as  soon as possible. Your physician should have your test results approximately 7 days after the  monitor has been mailed back to [facility].  Call [facility] Customer Care at [phone] if you have questions regarding  your ZIO XT patch monitor. Call them immediately if you see an orange light blinking on your  monitor.  If your monitor falls off in less than 4 days, contact our Monitor department at [phone].  If your monitor becomes loose or falls off after 4 days call [employer] at [phone] for  suggestions on securing your monitor

## 2023-04-12 NOTE — Progress Notes (Signed)
Cardiology Office Note:    Date:  04/12/2023   ID:  Julie Ross, DOB [birth year], MRN [id number]  PCP:  Sheliah Hatch, MD  Cardiologist:  Chrystie Nose, MD     Referring MD: Sheliah Hatch, MD   Chief Complaint: bradycardia  History of Present Illness:    Julie Ross is a 62 y.o. female with a history of nonobstructive CAD noted on a coronary CTA in 04/2021, hypertension, hyperlipidemia, hypothyroidism, obstructive sleep apnea, multiple sclerosis not on treatment due to side effects, meningiomas, and left breast cancer s/p radiation and chemo who is followed by Dr. Rennis Golden and presents today for evaluation of bradycardia.  Patient has been monitored with serial Echos over the years due to treatment of left breast cancer with radiation and chemotherapy.  EF has remained normal.  She was seen by Dr. Rennis Golden in 03/2021 at which time she reported shortness of breath, chest pressure, and palpitations.  An Echo and coronary CT scan were ordered for further evaluation.  Echo showed LVEF of 60-65% with normal wall motion, grade 1 diastolic dysfunction normal global longitudinal strain as well as normal RV size and function and no significant valvular disease.  Of note, prior Echos had mentioned mild mitral regurgitation but was considered only trivial on this Echo.  Coronary CTA showed a coronary calcium score of 4.25 (63rd percentile for age and sex) with only mild (1 to 24%) calcified plaque in the distal D1 and proximal LCx.  Patient was last seen by Gillian Shields, NP, in 03/2022 at which time she was doing well overall.  She reported 1 episode of chest pain the week prior that she attributed to stress over taking her granddaughter to the park and resolved quickly.  She denied any other chest pain.  She also reported some slight dizziness with position changes but no near-syncope or syncope.  No changes were made.  Patient had a mechanical fall on 03/07/2023 that occurred after she  slipped on water while walking in a restaurant.  She fell forward and hit her face sustaining 2 small lacerations near her right eyebrow and a significant hematoma bruising.  She was seen in the ED for this. Head CT showed no evidence of intracranial injury right facial swelling without acute fracture.  Lacerations were repaired.  She has had a concussion since this fall.  She was seen at her PCPs office yesterday and continued to report postconcussive dizziness and headaches as well as some visual changes PCP was going to reach out to neurology to ask for a sooner appointment.  She was also noted to be bradycardic in the office with heart rates in the upper 40s. therefore, she was advised to follow-up with Cardiology.  We discussed her recent falls. This was clearly a mechanical fall which occurred after slipping on a wet floor as described above. She denies any lightheadedness/ dizziness prior to the fall. She states the dizziness started after her concussion. It is exacerbated by bright lights and "looking up and down" and is accompanied by nausea. She also reports a headache and states she cannot read for long periods of time. She denies any recurrent falls and now syncope. She denies any other cardiac symptoms. No chest pain or shortness of breath. She reports occasional episodes of gasping for air during sleep but this sounds like it is due to untreated sleep apnea rather than orthopnea or PND. She has a CPAP machine but does not use it. No lower extremity.  She denies any palpitations and states her heart rate usually runs low high 50s to low 60s. She states she really does not have any cardiac concerns today and just scheduled this appointment due to PCP's recommendation.   EKGs/Labs/Other Studies Reviewed:    The following studies were reviewed:  Echocardiogram 04/19/2021: Impressions:  1. Left ventricular ejection fraction, by estimation, is 60 to 65%. The  left ventricle has normal function. The  left ventricle has no regional  wall motion abnormalities. Left ventricular diastolic parameters are  consistent with Grade I diastolic  dysfunction (impaired relaxation). The average left ventricular global  longitudinal strain is -22.2 %. The global longitudinal strain is normal.   2. Right ventricular systolic function is normal. The right ventricular  size is normal. There is normal pulmonary artery systolic pressure. The  estimated right ventricular systolic pressure is 24.5 mmHg.   3. The mitral valve is normal in structure. Trivial mitral valve  regurgitation. No evidence of mitral stenosis.   4. The aortic valve is tricuspid. There is mild calcification of the  aortic valve. Aortic valve regurgitation is not visualized. No aortic  stenosis is present.   5. The inferior vena cava is normal in size with greater than 50%  respiratory variability, suggesting right atrial pressure of 3 mmHg.  _______________  Coronary CTA 04/23/2021: Impressions: 1.  Calcium score 4.25 which is 63 rd percentile for age and sex 2.  Normal ascending aortic root diameter 3.6 cm 3.  CAD RADS 1 non obstructive CAD see description above  EKG:  EKG ordered today.   EKG Interpretation Date/Time:  Wednesday April 12 2023 14:29:45 EDT Ventricular Rate:  56 PR Interval:  182 QRS Duration:  86 QT Interval:  424 QTC Calculation: 409 R Axis:   8  Text Interpretation: Sinus bradycardia Minimal voltage criteria for LVH, may be normal variant ( R in aVL ) When compared with ECG of 20-Jan-2021 12:33, No significant change was found Confirmed by Marjie Skiff [id number]) on 04/12/2023 2:37:34 PM    Recent Labs: 04/11/2023: ALT 14; BUN 14; Creatinine, Ser 1.01; Hemoglobin 12.1; Platelets 232.0; Potassium 3.6; Sodium 135; TSH 1.72  Recent Lipid Panel    Component Value Date/Time   CHOL 165 04/11/2023 1138   TRIG 59.0 04/11/2023 1138   HDL 86.70 04/11/2023 1138   CHOLHDL 2 04/11/2023 1138   VLDL 11.8  04/11/2023 1138   LDLCALC 67 04/11/2023 1138   LDLCALC 64 06/26/2017 1603   LDLDIRECT 62.0 03/31/2021 1504    Physical Exam:    Vital Signs: BP 132/84 (BP Location: Right Arm, Patient Position: Sitting, Cuff Size: Large)   Pulse (!) 56   Ht 5\' 6"  (1.676 m)   Wt 198 lb 6.4 oz (90 kg)   LMP 12/05/2011   SpO2 95%   BMI 32.02 kg/m     Wt Readings from Last 3 Encounters:  04/12/23 198 lb 6.4 oz (90 kg)  04/12/23 197 lb (89.4 kg)  04/11/23 197 lb 8 oz (89.6 kg)     General: 62 y.o. Caucasian female in no acute distress. HEENT: Normocephalic and atraumatic. Sclera clear.  Neck: Supple. No carotid bruits. No JVD. Heart: Mildly bradycardic with normal rhythm. II/VI systolic murmur heard best at right upper sternal border. Lungs: No increased work of breathing. Clear to ausculation bilaterally. No wheezes, rhonchi, or rales.  Extremities: No lower extremity edema.   Skin: Warm and dry. Neuro: No focal deficits. Psych: Normal affect. Responds appropriately.   Assessment:  1. Bradycardia   2. Coronary artery disease involving native coronary artery of native heart without angina pectoris   3. Mitral valve insufficiency, unspecified etiology   4. Essential hypertension, benign   5. Hyperlipidemia, unspecified hyperlipidemia type   6. Obstructive sleep apnea     Plan:    Bradycardia Patient has been noted to have heart rates in the upper 40s at office visits with PCP and Oncology over the last month.  - EKG today shows sinus bradycardia with rate of 56 bpm. Asymptomatic with this. BP stable. - Potassium and TSH were within normal limits at PCP's office yesterday. Will check a magnesium level today. - Not on any AV nodal agents.  - Will order 1 week Zio monitor for further evaluation.  - Do not think this is the cause of her dizziness. It sounds like dizziness is solely due to her recent concussion.   Non-Obstructive CAD Coronary CTA in 04/2021 showed a coronary calcium  score of 4.25 (63rd percentile for age and sex) with only mild (1 to 24%) calcified plaque in the distal D1 and proximal LCx. - No chest pain.  - Continue aspirin and statin.  Mitral Regurgitation Prior Echo in 2014 showed mild mitral regurgitation. However, most recent Echo in 03/2021 showed only trivial mitral regurgitation as well as aortic valve calcification but no stenosis. - She has a very soft murmur on exam.  - Consider repeat Echo next year.   Hypertension BP well controlled.  - Continue current medications: Losartan-HCTZ 100-25mg  daily.   Hyperlipidemia Lipid panel on 04/11/2023: Total Cholesterol 165, Triglycerides 59, HDL 86, LDL 67. LDL goal <70 given CAD. - Continue Lipitor 20mg  daily. - Labs followed by PCP.   Obstructive Sleep Apnea - She is non-compliant with her CPAP. Recommended she use.  Disposition: Follow up in 1 year.    Signed, Corrin Parker, PA-C  04/12/2023 10:07 PM    [facility]

## 2023-04-13 ENCOUNTER — Ambulatory Visit: Payer: Medicare Other

## 2023-04-13 LAB — MAGNESIUM: Magnesium: 1.9 mg/dL (ref 1.6–2.3)

## 2023-04-14 NOTE — Therapy (Incomplete)
OUTPATIENT PHYSICAL THERAPY VESTIBULAR TREATMENT     Patient Name: Julie Ross MRN: [id number] DOB:[birth year], 62 y.o., female Today's Date: 04/14/2023  END OF SESSION:      Past Medical History:  Diagnosis Date   Anemia    h/o - ? bld. transfusion - more recent- ?2013   Anxiety    uses xanax mainly for sleep   Arthritis    knees    Basal cell carcinoma 01/11/2005   rim left nostril-,mohs   BCC (basal cell carcinoma) 01/11/2005   right cheek mid-mohs   BCC (basal cell carcinoma) 09/12/2006   right forehead -mohs   BCC (basal cell carcinoma) 01/19/2016   right upper arm -[id number]   BCC (basal cell carcinoma) 02/18/2019   forehead-mohs   Breast cancer (HCC) 11/2011   lul/ER+PR+, x1 lymph node    Cancer (HCC)    squamous cell skin cancer x7   Complication of anesthesia    Depression    GERD (gastroesophageal reflux disease)    H/O bone density study 03/2011   H/O colonoscopy    H/O echocardiogram    last one 02/2013, due to chemotherapy   Heart burn    Hx of radiation therapy 04/17/12- 06/04/12   left chest wall, axilla, L supraclavicular fossa 4500 cGy, left mastectomy scar/chest wall 5940 cGy   Hypertension    Hypothyroidism    IBS (irritable bowel syndrome)    Meningioma (HCC) 08/2020   Multiple sclerosis (HCC)    Neuromuscular disorder (HCC)    MS TX WITH CYMBALTA    Personal history of chemotherapy    Personal history of radiation therapy    SCC (squamous cell carcinoma) 01/11/2005   chest mid   Sleep apnea    no CPAP   Squamous cell carcinoma of skin 01/11/2005   over left lip   Wears glasses    Past Surgical History:  Procedure Laterality Date   APPLICATION OF ROBOTIC ASSISTANCE FOR SPINAL PROCEDURE N/A 03/12/2019   Procedure: APPLICATION OF ROBOTIC ASSISTANCE FOR SPINAL PROCEDURE;  Surgeon: Jadene Pierini, MD;  Location: [facility] OR;  Service: Neurosurgery;  Laterality: N/A;   BREAST REDUCTION SURGERY Right 01/25/2021   Procedure: MAMMARY  REDUCTION  (BREAST);  Surgeon: Glenna Fellows, MD;  Location: [facility];  Service: Plastics;  Laterality: Right;   CESAREAN SECTION     X2    COLONOSCOPY     ESOPHAGOGASTRODUODENOSCOPY     FLEXIBLE SIGMOIDOSCOPY     I & D KNEE WITH POLY EXCHANGE  03/02/2012   Procedure: IRRIGATION AND DEBRIDEMENT KNEE WITH POLY EXCHANGE;  Surgeon: Harvie Junior, MD;  Location: [facility] OR;  Service: Orthopedics;  Laterality: Left;  I&D of total knee with Possible Poly Exchange    JOINT REPLACEMENT  07/22/2011   left knee- multiple surgeries    KNEE ARTHROSCOPY     LT KNEE 04/2011   KNEE ARTHROSCOPY  06/20/1982   rt   MASTECTOMY     MASTECTOMY W/ SENTINEL NODE BIOPSY  12/19/2011   Procedure: MASTECTOMY WITH SENTINEL LYMPH NODE BIOPSY;  Surgeon: Currie Paris, MD;  Location: [facility];  Service: General;  Laterality: Left;  left breast and left axilla    MOHS SURGERY     right face   PORT-A-CATH REMOVAL  03/06/2012   Procedure: REMOVAL PORT-A-CATH;  Surgeon: Adolph Pollack, MD;  Location: [facility] OR;  Service: General;  Laterality: N/A;   PORT-A-CATH REMOVAL Right 08/22/2012   Procedure: REMOVAL PORT-A-CATH;  Surgeon: Sharlet Salina  Lurlean Leyden, MD;  Location: [facility];  Service: General;  Laterality: Right;   PORTACATH PLACEMENT  01/16/2012   Procedure: INSERTION PORT-A-CATH;  Surgeon: Currie Paris, MD;  Location: [facility];  Service: General;  Laterality: Right;  Porta Cath Placement    PORTACATH PLACEMENT  05/01/2012   Procedure: INSERTION PORT-A-CATH;  Surgeon: Currie Paris, MD;  Location: [facility];  Service: General;  Laterality: Right;  right internal jugular port-a-cath insertion   TEE WITHOUT CARDIOVERSION  03/06/2012   Procedure: TRANSESOPHAGEAL ECHOCARDIOGRAM (TEE);  Surgeon: Wendall Stade, MD;  Location: [facility] END[facility]COPY;  Service: Cardiovascular;  Laterality: N/A;  Rm. [id number]   TONSILLECTOMY     TOTAL KNEE ARTHROPLASTY  08/15/2011   Procedure: TOTAL  KNEE ARTHROPLASTY; lft Surgeon: Harvie Junior, MD;  Location: [facility];  Service: Orthopedics;  Laterality: Left;  RIGHT KNEE CORTIZONE INJECTION   TOTAL KNEE ARTHROPLASTY Left 08/18/2012   Procedure:  Irrigation and debridement of LEFT total knee;  Removal of total knee parts; Implant of spacers;  Surgeon: Nestor Lewandowsky, MD;  Location: [facility] OR;  Service: Orthopedics;  Laterality: Left;   TOTAL KNEE ARTHROPLASTY Right 08/12/2013   TOTAL KNEE ARTHROPLASTY Right 08/12/2013   Procedure: RIGHT TOTAL KNEE ARTHROPLASTY;  Surgeon: Nestor Lewandowsky, MD;  Location: [facility];  Service: Orthopedics;  Laterality: Right;   TOTAL KNEE REVISION Left 04/19/2013   Procedure: TOTAL KNEE REVISION AND REMOVAL CEMENT SPACER;  Surgeon: Nestor Lewandowsky, MD;  Location: [facility];  Service: Orthopedics;  Laterality: Left;   TRANSFORAMINAL LUMBAR INTERBODY FUSION (TLIF) WITH PEDICLE SCREW FIXATION 1 LEVEL Bilateral 03/12/2019   Procedure: Lumbar four-five Open transforaminal lumbar interbody fusion with Lumbar four-five laminectomy, bilateral Lumbar four-five Pedicle screw placement;  Surgeon: Jadene Pierini, MD;  Location: [facility];  Service: Neurosurgery;  Laterality: Bilateral;   TUMOR REMOVAL     FROM PELVIS AGE 1   Patient Active Problem List   Diagnosis Date Noted   Vitamin D deficiency 10/10/2022   Cerebral meningioma (HCC) 11/27/2020   Genetic testing 09/11/2020   Obesity (BMI 30-39.9) 07/14/2020   Meningioma (HCC) 06/16/2020   Arthritis 11/04/2019   Insomnia 08/22/2019   Lumbar radiculopathy 03/12/2019   Spondylolisthesis at L4-L5 level 08/24/2018   Facet syndrome, lumbar 08/24/2018   Chronic SI joint pain 03/30/2018   Delayed sleep phase syndrome 03/30/2018   History of optic neuritis 07/19/2016   Hyperlipidemia 06/18/2015   H/O malignant neoplasm of breast 02/18/2015   Gonalgia 02/18/2015   Relapsing remitting multiple sclerosis (HCC) 02/18/2015   Physical exam 12/17/2014   Other fatigue 10/15/2014    Obstructive sleep apnea 10/15/2014   Restless leg syndrome 10/15/2014   Allergic rhinitis 09/30/2014   Memory loss 10/14/2013   Depression, recurrent (HCC) 08/30/2013   S/P total knee arthroplasty 08/23/2013   Hypothyroidism 05/29/2013   Esophageal reflux 05/29/2013   Hx of radiation therapy    Essential hypertension, benign 06/11/2012   Multiple sclerosis (HCC) 01/23/2012   Breast cancer of upper-outer quadrant of right female breast (HCC) 11/22/2011   Osteoarthritis of both knees 08/15/2011    PCP: Neena Rhymes REFERRING PROVIDER: Neena Rhymes  REFERRING DIAG:  A54.0J8J (ICD-10-CM) - Concussion without loss of consciousness, initial encounter  Post-concussive vertigo   THERAPY DIAG:  No diagnosis found.  ONSET DATE: 03/07/23  Rationale for Evaluation and Treatment: Rehabilitation  SUBJECTIVE:   SUBJECTIVE STATEMENT: Still getting dizzy, not any better. Headaches are still happening.   I slipped  at a restaurant and hit my hit. I still get nauseous and dizzy, bright lights in grocery store also make me dizzy.   Pt accompanied by: self  PERTINENT HISTORY:  03/07/23--Patient is a 62 year old female with a history of hypertension, hypothyroid, MS, anemia and cancer who is presenting today after a fall.  Patient was walking into a restaurant and the floor was wet causing her to slip and fall forward hitting her face on the floor.  She denies any loss of consciousness.  She has no neck pain but does have significant facial pain and headache.  No vision changes.  No numbness tingling in the arms or legs or difficulty walking.  She does not take any anticoagulation.  Only injuries to the face  03/13/23--Facial laceration- new.  Well approximated, sutures ready to be removed.  Removed w/o difficulty and pt tolerated well. Facial contusion- new.  Pt w/ hematoma of R orbital ridge.  No bony abnormality.  CT reviewed- no fracture present.  Encouraged ice or heat- whichever pt feels  provides more relief. Concussion- new.  Pt continues to have headache, dizziness, and nausea.  Has Zofran to use.  Will start Meclizine prn and refer to Neurorehab.  Reviewed need for physical and cognitive rest.  Pt expressed understanding  PAIN:  Are you having pain? No  PRECAUTIONS: None  RED FLAGS: None   WEIGHT BEARING RESTRICTIONS: No  FALLS: Has patient fallen in last 6 months? Yes. Number of falls 1  LIVING ENVIRONMENT: Lives with: lives with their spouse Lives in: House/apartment Stairs: Yes: Internal: one flight steps; on right going up  PLOF: Independent  PATIENT GOALS: to feel better and stop feeling dizzy  OBJECTIVE:  Note: Objective measures were completed at Evaluation unless otherwise noted.  DIAGNOSTIC FINDINGS:  IMPRESSION: 1. No progression of left sphenoid wing and tentorial meningiomas, as described above. 2. 5 mm meningioma along the superior left frontal convexity, in retrospect stable. 3. Chronic white matter disease is stable. There are indistinct areas of more subtle and infiltrating T2 hyperintensity at the left frontal lobe, nonenhancing. Consider shorter term follow-up for this finding.    COGNITION: Overall cognitive status: Within functional limits for tasks assessed   SENSATION: WFL  POSTURE:  rounded shoulders and forward head  Cervical ROM:  all WNL   LOWER EXTREMITY MMT: 5/5 BLE    GAIT: Gait pattern: decreased stride length, ataxic, narrow BOS, poor foot clearance- Right, and poor foot clearance- Left Distance walked: in clinic distances Assistive device utilized: None Level of assistance: Complete Independence Comments: slowed gait, she does not walk straight and tends to veer to the R    VESTIBULAR ASSESSMENT:   SYMPTOM BEHAVIOR:   Non-Vestibular symptoms: headaches and nausea/vomiting  Type of dizziness: Blurred Vision, Imbalance (Disequilibrium), Spinning/Vertigo, and Lightheadedness/Faint  Frequency: every  day  Duration: a couple hours  Aggravating factors: Induced by position change: sit to stand and Worse outside or in busy environment  Relieving factors: lying supine, medication, closing eyes, and rest  Progression of symptoms: better  OCULOMOTOR EXAM:  Ocular Alignment: normal  Ocular ROM: No Limitations  Spontaneous Nystagmus: absent  Gaze-Induced Nystagmus: absent  Smooth Pursuits: intact  Saccades: intact   VESTIBULAR - OCULAR REFLEX:   Slow VOR: Comment: able to do about 5 reps before feeling nauseous   VOR Cancellation: Normal  FUNCTIONAL GAIT: 5 times sit to stand: 14.83s Functional gait assessment: 16/30   VESTIBULAR TREATMENT:  DATE: 04/17/23 NuStep STS Walking on beam Eye jumps Letter tracking  4 square saccade    04/10/23 NuStep L4 x48mins  Standing on airex, EO/EC in bars in low light  Head turns and nods on airex in low light  Walking on beam in low light  VOR x1 exercises seated x10 horizontal and vertical  Pencil push ups x10 Mal Amabile string 1 bead- very difficult Tandem standing in bars  Cone taps in bars     04/05/23: rates dizziness at 6/10 today upon arrival and over last 24 hrs Therex:  Initially recumbent bike x 6 min, level 2.3 Reviewed seated ex from home program but patient with c/o increased nausea/dizziness, consistently with L head rotation and focused gaze Therefore performed BPPV assessment: + c/o increased dizziness with R Dix halpike positioning, therefore utilized Epley maneuver, upon reassessment pt with negative Sx.     EVAL 03/29/23  PATIENT EDUCATION: Education details: POC and HEP Person educated: Patient Education method: Explanation Education comprehension: verbalized understanding  HOME EXERCISE PROGRAM: Access Code: [email or web address] URL: [email or web address] Date: 03/29/2023 Prepared by: Cassie Freer  Exercises -  Standing Gaze Stabilization with Two Near Targets and Head Rotation  - 1 x daily - 7 x weekly - 2 sets - 10 reps - Seated Gaze Stabilization with Head Rotation  - 1 x daily - 7 x weekly - 2 sets - 10 reps - Seated VOR Cancellation  - 1 x daily - 7 x weekly - 2 sets - 10 reps - Standing Tandem Balance with Counter Support  - 1 x daily - 7 x weekly - 2 reps - 15 hold - Standing Single Leg Stance with Counter Support  - 1 x daily - 7 x weekly - 2 reps - 10 hold  GOALS: Goals reviewed with patient? Yes  SHORT TERM GOALS: Target date: 05/10/23 Patient will be independent with initial HEP. Goal status: INITIAL   LONG TERM GOALS: Target date: 06/21/23  Patient will be independent with advanced/ongoing HEP to improve outcomes and carryover.  Goal status: INITIAL  2.  Patient will be to read and look at her phone without dizziness Baseline: unable to do for more than a few mins  Goal status: INITIAL  3.  Patient will report 50% decrease in nausea and dizziness symptoms  Baseline: every day for hours at a time Goal status: INITIAL   4.  Patient will demonstrate at least  25/30 on FGA to improve gait stability and reduce risk for falls. Baseline: 16/30 Goal status: INITIAL   ASSESSMENT:  CLINICAL IMPRESSION: Patient is a 62 y.o. female who participated today in physical therapy treatment for post-concussive vertigo. She continues to have symptoms of nausea, headache,  and dizziness on a daily basis. She was able to tolerate some more activity today especially balance related. She is unsteady with interventions on foam surfaces but able to regain balance independently. VOR exercises are still hard and increase her symptoms especially horizontal movements to the left. Difficulty with maintaining gaze on target. Very difficult trying to work with Luvenia Redden focusing on 1 bead. Patient will benefit from PT to address her post concussive vertigo symptoms as well as her gait and balance to decrease  her risk for falls.  OBJECTIVE IMPAIRMENTS: Abnormal gait, decreased balance, difficulty walking, and dizziness.   ACTIVITY LIMITATIONS: stairs, locomotion level, and driving  PARTICIPATION LIMITATIONS: driving, community activity, and yard work  [facility] POTENTIAL: Good  CLINICAL DECISION MAKING: Stable/uncomplicated  EVALUATION COMPLEXITY: Low   PLAN:  PT FREQUENCY: 2x/week  PT DURATION: 12 weeks  PLANNED INTERVENTIONS: Therapeutic exercises, Therapeutic activity, Neuromuscular re-education, Balance training, Gait training, Patient/Family education, Self Care, Joint mobilization, Stair training, Vestibular training, Canalith repositioning, Cryotherapy, Moist heat, and Manual therapy  PLAN FOR NEXT SESSION: reassess BPPV, VOR exercises as tolerated, balance training, standing with head turns and progressing to do more multitasking    Cassie Freer, PT, DPT 04/14/2023, 9:41 AM

## 2023-04-17 ENCOUNTER — Ambulatory Visit: Payer: Medicare Other

## 2023-04-19 ENCOUNTER — Ambulatory Visit: Payer: Medicare Other | Admitting: Physical Therapy

## 2023-04-20 DIAGNOSIS — R001 Bradycardia, unspecified: Secondary | ICD-10-CM | POA: Diagnosis not present

## 2023-04-21 ENCOUNTER — Other Ambulatory Visit: Payer: Self-pay

## 2023-04-23 ENCOUNTER — Other Ambulatory Visit: Payer: Self-pay | Admitting: Neurology

## 2023-04-24 ENCOUNTER — Ambulatory Visit: Payer: Medicare Other | Admitting: Physical Therapy

## 2023-04-24 NOTE — Telephone Encounter (Signed)
Last seen on 04/12/23 Follow up scheduled on 06/28/23 Last filled on 04/19/23 Too soon to refill

## 2023-04-26 ENCOUNTER — Ambulatory Visit: Payer: Medicare Other | Admitting: Physical Therapy

## 2023-05-03 ENCOUNTER — Telehealth: Payer: Self-pay | Admitting: Family Medicine

## 2023-05-03 DIAGNOSIS — R001 Bradycardia, unspecified: Secondary | ICD-10-CM | POA: Diagnosis not present

## 2023-05-03 NOTE — Telephone Encounter (Signed)
Per front dest staff these have been sent.

## 2023-05-03 NOTE — Telephone Encounter (Signed)
Placed in Dr.Tabori folder at nurse station.

## 2023-05-03 NOTE — Telephone Encounter (Signed)
Faxed to Medical Records.

## 2023-05-03 NOTE — Telephone Encounter (Signed)
Gave to Cassie (front desk) to send to medical records

## 2023-05-03 NOTE — Telephone Encounter (Signed)
Received forms from [employer] placed in provider bin Order # [id number]

## 2023-05-03 NOTE — Telephone Encounter (Signed)
Forms need to go to medical records

## 2023-05-09 ENCOUNTER — Ambulatory Visit: Payer: Medicare Other | Admitting: Family Medicine

## 2023-05-09 ENCOUNTER — Encounter: Payer: Self-pay | Admitting: Family Medicine

## 2023-05-09 VITALS — BP 112/64 | HR 51 | Temp 97.8°F | Ht 66.0 in | Wt 196.2 lb

## 2023-05-09 DIAGNOSIS — R110 Nausea: Secondary | ICD-10-CM | POA: Diagnosis not present

## 2023-05-09 DIAGNOSIS — I10 Essential (primary) hypertension: Secondary | ICD-10-CM | POA: Diagnosis not present

## 2023-05-09 DIAGNOSIS — H539 Unspecified visual disturbance: Secondary | ICD-10-CM | POA: Diagnosis not present

## 2023-05-09 MED ORDER — ONDANSETRON HCL 4 MG PO TABS: 4.0000 mg | ORAL_TABLET | Freq: Three times a day (TID) | ORAL | 1 refills | Status: DC | PRN

## 2023-05-09 NOTE — Patient Instructions (Addendum)
Schedule your complete physical in April Blood pressure looks great!  No changes at this time Call Dr Arbutus Ped [facility]) and let them know about the black spots in your vision ASAP Continue to drink LOTS of fluids USE the Zofran as needed Be sure to tell Neuro if the headaches continue Call with any questions or concerns Hang in there!!!

## 2023-05-09 NOTE — Progress Notes (Signed)
   Subjective:    Patient ID: Julie Ross, female    DOB: 1960-12-19, 62 y.o.   MRN: [id number]  HPI HTN- chronic problem.  BP was elevated at last visit but is excellent today on Losartan hydrochlorothiazide 100/25mg  daily.  Continues to feel 'terrible' since her fall.  Has ongoing headaches and black spots in her R visual field.  Black spots first started 'a couple of weeks ago' and are constant.  Continues to have nausea when out in public.  Unclear if this is light related.  No CP, SOB, edema.   Review of Systems For ROS see HPI     Objective:   Physical Exam Vitals reviewed.  Constitutional:      General: She is not in acute distress.    Appearance: She is well-developed.  HENT:     Head: Normocephalic and atraumatic.  Eyes:     Extraocular Movements: Extraocular movements intact.     Conjunctiva/sclera: Conjunctivae normal.     Pupils: Pupils are equal, round, and reactive to light.  Neck:     Thyroid: No thyromegaly.  Cardiovascular:     Rate and Rhythm: Normal rate and regular rhythm.     Heart sounds: Normal heart sounds. No murmur heard. Pulmonary:     Effort: Pulmonary effort is normal. No respiratory distress.     Breath sounds: Normal breath sounds.  Abdominal:     General: There is no distension.     Palpations: Abdomen is soft.     Tenderness: There is no abdominal tenderness.  Musculoskeletal:     Cervical back: Normal range of motion and neck supple.  Lymphadenopathy:     Cervical: No cervical adenopathy.  Skin:    General: Skin is warm and dry.  Neurological:     Mental Status: She is alert and oriented to person, place, and time.  Psychiatric:        Behavior: Behavior normal.           Assessment & Plan:  Visual changes- new.  Pt reports she had eyes checked in May but developed black spots in her R visual field starting a few weeks ago.  Unclear if this is related to her recent fall/concussion or if something else is at play.  Stressed need  to call and f/u w/ eye doctor ASAP.  Pt expressed understanding and is in agreement w/ plan.   Nausea- ongoing issue.  Pt continues to have nausea when in the grocery store or out running errands.  Unclear if this is due to fluorescent lights, increased noise and visual stimulation or another cause.  Refill provided on Zofran.

## 2023-05-09 NOTE — Assessment & Plan Note (Signed)
BP is much improved today and within normal range.  No med changes at this time.

## 2023-05-12 DIAGNOSIS — H2513 Age-related nuclear cataract, bilateral: Secondary | ICD-10-CM | POA: Diagnosis not present

## 2023-05-12 DIAGNOSIS — H43813 Vitreous degeneration, bilateral: Secondary | ICD-10-CM | POA: Diagnosis not present

## 2023-05-12 DIAGNOSIS — H35341 Macular cyst, hole, or pseudohole, right eye: Secondary | ICD-10-CM | POA: Diagnosis not present

## 2023-05-22 DIAGNOSIS — C44519 Basal cell carcinoma of skin of other part of trunk: Secondary | ICD-10-CM | POA: Diagnosis not present

## 2023-06-23 ENCOUNTER — Other Ambulatory Visit: Payer: Self-pay | Admitting: Neurology

## 2023-06-26 NOTE — Telephone Encounter (Signed)
 Last seen on 04/12/23 Follow up scheduled on 06/28/23

## 2023-06-28 ENCOUNTER — Ambulatory Visit: Payer: Medicare Other | Admitting: Neurology

## 2023-06-28 ENCOUNTER — Encounter: Payer: Self-pay | Admitting: Neurology

## 2023-06-28 VITALS — BP 127/73 | HR 59 | Ht 66.0 in | Wt 198.5 lb

## 2023-06-28 DIAGNOSIS — Z853 Personal history of malignant neoplasm of breast: Secondary | ICD-10-CM | POA: Diagnosis not present

## 2023-06-28 DIAGNOSIS — R5383 Other fatigue: Secondary | ICD-10-CM

## 2023-06-28 DIAGNOSIS — G4700 Insomnia, unspecified: Secondary | ICD-10-CM

## 2023-06-28 DIAGNOSIS — R269 Unspecified abnormalities of gait and mobility: Secondary | ICD-10-CM

## 2023-06-28 DIAGNOSIS — F0781 Postconcussional syndrome: Secondary | ICD-10-CM

## 2023-06-28 DIAGNOSIS — G4733 Obstructive sleep apnea (adult) (pediatric): Secondary | ICD-10-CM | POA: Diagnosis not present

## 2023-06-28 DIAGNOSIS — D329 Benign neoplasm of meninges, unspecified: Secondary | ICD-10-CM | POA: Diagnosis not present

## 2023-06-28 DIAGNOSIS — G35 Multiple sclerosis: Secondary | ICD-10-CM

## 2023-06-28 MED ORDER — IMIPRAMINE HCL 25 MG PO TABS: 25.0000 mg | ORAL_TABLET | Freq: Every day | ORAL | 5 refills | Status: DC

## 2023-06-28 NOTE — Progress Notes (Signed)
 [facility]  PATIENT: Julie Ross DOB: 1960-12-12  REFERRING DOCTOR OR PCP:  Comer Greet SOURCE: patient and records from [facility]  _________________________________   HISTORICAL  CHIEF COMPLAINT:  Chief Complaint  Patient presents with   Room 10    Pt is here Alone. Pt states that things have been going okay. Pt states she has been having dizzy spells. Pt states that when she lays down the room feels like its spinning. Pt wants to know if what she is going through is because of her fall on Sep 17th. Pt states that she has been overly tired.     HISTORY OF PRESENT ILLNESS:  Kaci Dillie is a 63 y.o. woman with multiple sclerosis and breast cancer.    Update 06/28/2023: She continues to note some issues since her fall .  She fell on a wet surface 03/07/2023 and slammed her head hitting the right lateral forehead.  She had a large bruise and laceration.  She had several seconds of LOC.   Right away, she had a headache and nausea.   She was taken to the [facility] ED.   CT scan showed no intracranial acute findings but showed right facial swelling.  Maxillofacial bones were ok.     She notes a positional vertigo still for a few minues about once a day.    She will sometimes have nausea and headache, but better than a couple months ago.  The HA is often present when she wakes up.  Zofran  helps the nausea when present.    Sometimes she feels lightheaded.  When present, the  HA is dull.  She has had some positional vertigo - fine at rest but more a problem when she gets up and walks.   When dizziness is worse she also has nausea.   No recent vomiting (had the day of injury).   She notes vision seems more blurry.    Bright lights are bothersome.   She feels cognitively foggy.  She is sleeping ok at night.     The brain MRI showed that the meningiomas were stable.  A more diffuse appearing focus in the left frontal lobe was more apparent on the current scan  compared to the scan from last year.  This is nonspecific and could be due to her known MS though other etiology needs to also be considered such as low-grade glioma and she reports that a follow-up MRI has been scheduled for 6 months.  MRI Brain 03/23/2023 (routine for meningioma f/u -- Dr. Vazlow).  1. No progression of left sphenoid wing and tentorial meningiomas, as described above. 2. 5 mm meningioma along the superior left frontal convexity, in retrospect stable. 3. Chronic white matter disease is stable. There are indistinct areas of more subtle and infiltrating T2 hyperintensity at the left frontal lobe, nonenhancing. Consider shorter term follow-up for this finding.   Her multiple sclerosis seems stable.  She has been off a DMT (she had been on Chemo for breast cancer in 2013/2014 and stopped around that time.   .  She has no recent exacerbations.  She continues to note some progression with reduced gait and balance.  She does not note much weakness or dysesthesias.  Bladder is doing well.  MS lesions on the MRI of the brain 10/01/2020 was unchanged compared to the MRI 06/16/2020 and 04/13/2018 MRI is also showed meningiomas that have increased in size.   She also has meningiomas and sees Dr. Vazlow.  She  had stereotactic radiosurgery 10/06/2020 for the meningiomas (left sphenoid wing and left tentorial)   She sees Dr. Charmain again in October  Hr gait and balance are stable compared to last year.    She has stumbles but not falls.    She has trouble with stairs and uses the bannister.  She denies numbness.    Bladder function is unchanged.      She has diplopia when she reads.  She sees [facility] and will be having another appt soon.      MRI also shows a more diffuse left hemisphere focus in the brain +/- unchanged compared to 2022 but subtle/smaller in 2019.    She has noted more fatigue.   Sleep onset is poor even with clonazepam . .  She often takes a short nap after getting up at 830.   She then lays down on the couch and dozes off again.    Due to more fatigue.  We did a PSG.  It showed minimal OSA with an AHI of 2.5.  However, she had moderate REM OSA but very little REM sleep was recorded.  Insomnia is better with clonazepam  and this helps her sleep through the night better than Xanax  did.      Provigil caused her to feel jittery and phentermine  caused stomach upset n the past.    She feels more tired since the fall.     She appears to have delayed sleep phase and goes to bed at 130 am but sets alarm for 830 a.m. to let dogs out (she lets them out at 10 pm)   we discussed matching up her and her dogs schedule more to allow her to have 8+ hours of sleep.      She had breast cancer in 2013.   She was seeing Dr. Gudena.   She has no recurrence and is off all medications for it now.   She had neurosurgery at [facility] (had synovial cyst and spondylolisthesis; Dr. Cheryle).   Back is doing better.   Polysomnogram 11/21/2022showed: Negligible overall Obstructive Sleep Apnea with AHI = 2.5.  However, there was moderate REM related OSA with a REM AHI = 22. No significant PLMS Delayed sleep latency.  Normal sleep efficiency. All stages of sleep were recorded.   MS History:    She was diagnosed with MS in 1999 after presenting with optic neuritis.   She had a spinal tap, MRI and angiogram.   Initially, the diagnosis was uncertain but she had changes in the MRI over time leading to a diagnosis of MS a few years later.    Initially, she was placed on Copaxone, then switched to Avonex due to skin reactions.   She then switched to Tysabri when she had some mild breakthrough disease. She tolerated Tysabri very well and her MS did well.  However, she was JCV antibody positive so she stopped in 2012.   She did not restart Avonex as she did not want to go back to an injection.    Her last exacerbation was optic neuritis in 2012 treated with steroids. She proved but did not get back to baseline.  This  exacerbation occurred off any DMT.  She tried Gilenya in 2013. She had trouble tolerating Gilenya and discontinued shortly after starting it. She was going to start Tecfidera but was diagnosed with breast cancer in 2013 and opted not to start at that time. She received chemotherapy including Taxotere , carboplatin , Herceptin  in 2013 and 2014. An MRI  of the brain in 2015 showed only mild MS progression compared to her previous one in 2011.   She stopped disease modifying therapies in 2014 and 2015.  Subsequent MRIs have been stable.  IMAGING: MRI of the brain 04/13/2018 showed multiple T2/FLAIR hyperintense foci in the brainstem, cerebellum and hemispheres in a pattern and configuration consistent with demyelinating plaque associated with multiple sclerosis.  One focus in the right splenium was not present on the 2017 MRI and shows some enhancement without edema consistent with a subacute demyelinating plaque.  Other foci were present on the 2017 MRI.     Two meningeal based foci (left sphenoid wing and left tentorium) as described above consistent with meningiomas.  They are described as the same size on the 2018 MRI report.   Complex stable pineal cyst  MRI of the brain 06/16/2020 showed stable MS lesions.  These meningiomas had increased in size compared to 2019  MRI of the brain 10/01/2020 showed no further changes compared to 06/16/2020  MRI of the brain 03/23/2023 showed:   1. No progression of left sphenoid wing and tentorial meningiomas, as described above.    2. A 5 mm meningioma along the superior left frontal convexity, in  retrospect stable. 3. Chronic white matter disease is stable. There are indistinct areas of more subtle and infiltrating T2 hyperintensity at the left frontal lobe, nonenhancing. Consider shorter term follow-up for this finding.   REVIEW OF SYSTEMS: Constitutional: No fevers, chills, sweats, or change in appetite.  Notes fatigue Eyes: No visual changes, double vision, eye  pain Ear, nose and throat: No hearing loss, ear pain, nasal congestion, sore throat.  Recent URI Cardiovascular: No chest pain, palpitations Respiratory:  No shortness of breath at rest or with exertion.   No wheezes.  Has OSA GastrointestinaI: No nausea, vomiting, diarrhea, abdominal pain, fecal incontinence Genitourinary:  No dysuria, urinary retention or frequency.  She has 1 times nocturia most nights Musculoskeletal:  No neck pain, back pain.  She has bilateral knee pain Integumentary: No rash, pruritus, skin lesions Neurological: as above Psychiatric: No depression at this time.  Some anxiety Endocrine: No palpitations, diaphoresis, change in appetite, change in weigh or increased thirst Hematologic/Lymphatic:  No anemia, purpura, petechiae. Allergic/Immunologic: No itchy/runny eyes, nasal congestion, recent allergic reactions, rashes  ALLERGIES: Allergies  Allergen Reactions   Bee Venom Anaphylaxis   Fentanyl  Other (See Comments)    Very low BP, decrease LOC-received Narcan -in 2020    HOME MEDICATIONS:  Current Outpatient Medications:    Ascorbic Acid (VITAMIN C PO), Take by mouth., Disp: , Rfl:    aspirin  EC 81 MG tablet, Take 1 tablet (81 mg total) by mouth daily. Swallow whole., Disp: 90 tablet, Rfl: 3   atorvastatin  (LIPITOR) 20 MG tablet, TAKE 1 TABLET BY MOUTH ONCE  DAILY, Disp: 100 tablet, Rfl: 2   baclofen  (LIORESAL ) 10 MG tablet, TAKE 1 TABLET BY MOUTH 3 TIMES  DAILY AS NEEDED FOR MUSCLE  SPASM(S), Disp: 270 tablet, Rfl: 3   buPROPion  (WELLBUTRIN  XL) 150 MG 24 hr tablet, TAKE 1 TABLET BY MOUTH DAILY, Disp: 90 tablet, Rfl: 3   clonazePAM  (KLONOPIN ) 1 MG tablet, TAKE 1 TABLET BY MOUTH AT  BEDTIME, Disp: 90 tablet, Rfl: 1   EPINEPHrine  0.3 mg/0.3 mL IJ SOAJ injection, Inject 0.3 mg into the muscle as needed for anaphylaxis., Disp: 1 each, Rfl: 3   gabapentin  (NEURONTIN ) 600 MG tablet, TAKE 1 TABLET BY MOUTH 3 TIMES  DAILY, Disp: 300 tablet, Rfl: 0  HYDROcodone -acetaminophen  (NORCO/VICODIN) 5-325 MG tablet, , Disp: , Rfl:    imipramine  (TOFRANIL ) 25 MG tablet, Take 1 tablet (25 mg total) by mouth at bedtime., Disp: 30 tablet, Rfl: 5   levothyroxine  (SYNTHROID ) 88 MCG tablet, TAKE 1 TABLET BY MOUTH DAILY  BEFORE BREAKFAST, Disp: 100 tablet, Rfl: 2   losartan -hydrochlorothiazide  (HYZAAR ) 100-25 MG tablet, TAKE 1 TABLET BY MOUTH EVERY  MORNING BEFORE BREAKFAST (Patient taking differently: TAKE 1 TABLET BY MOUTH EVERY  MORNING BEFORE BREAKFAST), Disp: 100 tablet, Rfl: 2   meclizine  (ANTIVERT ) 25 MG tablet, Take 1 tablet (25 mg total) by mouth 3 (three) times daily as needed for dizziness., Disp: 45 tablet, Rfl: 0   methylPREDNISolone  (MEDROL ) 4 MG tablet, Taper from 6 pills po for one day to 1 pill po the last day over 6 days, Disp: 21 tablet, Rfl: 0   omeprazole  (PRILOSEC) 40 MG capsule, TAKE 1 CAPSULE BY MOUTH DAILY, Disp: 100 capsule, Rfl: 2   ondansetron  (ZOFRAN ) 4 MG tablet, Take 1 tablet (4 mg total) by mouth every 8 (eight) hours as needed for nausea or vomiting., Disp: 30 tablet, Rfl: 1   potassium chloride  SA (KLOR-CON  M) 20 MEQ tablet, TAKE 1 TABLET BY MOUTH DAILY, Disp: 100 tablet, Rfl: 2   sertraline  (ZOLOFT ) 100 MG tablet, TAKE 1 TABLET BY MOUTH DAILY, Disp: 90 tablet, Rfl: 3  PAST MEDICAL HISTORY: Past Medical History:  Diagnosis Date   Anemia    h/o - ? bld. transfusion - more recent- ?2013   Anxiety    uses xanax  mainly for sleep   Arthritis    knees    Basal cell carcinoma 01/11/2005   rim left nostril-,mohs   BCC (basal cell carcinoma) 01/11/2005   right cheek mid-mohs   BCC (basal cell carcinoma) 09/12/2006   right forehead -mohs   BCC (basal cell carcinoma) 01/19/2016   right upper arm -[id number]   BCC (basal cell carcinoma) 02/18/2019   forehead-mohs   Breast cancer (HCC) 11/2011   lul/ER+PR+, x1 lymph node    Cancer (HCC)    squamous cell skin cancer x7   Complication of anesthesia    Depression    GERD  (gastroesophageal reflux disease)    H/O bone density study 03/2011   H/O colonoscopy    H/O echocardiogram    last one 02/2013, due to chemotherapy   Heart burn    Hx of radiation therapy 04/17/12- 06/04/12   left chest wall, axilla, L supraclavicular fossa 4500 cGy, left mastectomy scar/chest wall 5940 cGy   Hypertension    Hypothyroidism    IBS (irritable bowel syndrome)    Meningioma (HCC) 08/2020   Multiple sclerosis (HCC)    Neuromuscular disorder (HCC)    MS TX WITH CYMBALTA     Personal history of chemotherapy    Personal history of radiation therapy    SCC (squamous cell carcinoma) 01/11/2005   chest mid   Sleep apnea    no CPAP   Squamous cell carcinoma of skin 01/11/2005   over left lip   Wears glasses     PAST SURGICAL HISTORY: Past Surgical History:  Procedure Laterality Date   APPLICATION OF ROBOTIC ASSISTANCE FOR SPINAL PROCEDURE N/A 03/12/2019   Procedure: APPLICATION OF ROBOTIC ASSISTANCE FOR SPINAL PROCEDURE;  Surgeon: Cheryle Debby LABOR, MD;  Location: [facility] OR;  Service: Neurosurgery;  Laterality: N/A;   BREAST REDUCTION SURGERY Right 01/25/2021   Procedure: MAMMARY REDUCTION  (BREAST);  Surgeon: Arelia Filippo, MD;  Location: [facility];  Service: Government Social Research Officer;  Laterality: Right;   CESAREAN SECTION     X2    COLONOSCOPY     ESOPHAGOGASTRODUODENOSCOPY     FLEXIBLE SIGMOIDOSCOPY     I & D KNEE WITH POLY EXCHANGE  03/02/2012   Procedure: IRRIGATION AND DEBRIDEMENT KNEE WITH POLY EXCHANGE;  Surgeon: Norleen LITTIE Gavel, MD;  Location: [facility] OR;  Service: Orthopedics;  Laterality: Left;  I&D of total knee with Possible Poly Exchange    JOINT REPLACEMENT  07/22/2011   left knee- multiple surgeries    KNEE ARTHROSCOPY     LT KNEE 04/2011   KNEE ARTHROSCOPY  06/20/1982   rt   MASTECTOMY     MASTECTOMY W/ SENTINEL NODE BIOPSY  12/19/2011   Procedure: MASTECTOMY WITH SENTINEL LYMPH NODE BIOPSY;  Surgeon: Sherlean JINNY Laughter, MD;  Location: [facility] OR;  Service:  General;  Laterality: Left;  left breast and left axilla    MOHS SURGERY     right face   PORT-A-CATH REMOVAL  03/06/2012   Procedure: REMOVAL PORT-A-CATH;  Surgeon: Krystal JINNY Russell, MD;  Location: [facility] OR;  Service: General;  Laterality: N/A;   PORT-A-CATH REMOVAL Right 08/22/2012   Procedure: REMOVAL PORT-A-CATH;  Surgeon: Morene ONEIDA Olives, MD;  Location: [facility];  Service: General;  Laterality: Right;   PORTACATH PLACEMENT  01/16/2012   Procedure: INSERTION PORT-A-CATH;  Surgeon: Sherlean JINNY Laughter, MD;  Location: [facility];  Service: General;  Laterality: Right;  Porta Cath Placement    PORTACATH PLACEMENT  05/01/2012   Procedure: INSERTION PORT-A-CATH;  Surgeon: Sherlean JINNY Laughter, MD;  Location: [facility];  Service: General;  Laterality: Right;  right internal jugular port-a-cath insertion   TEE WITHOUT CARDIOVERSION  03/06/2012   Procedure: TRANSESOPHAGEAL ECHOCARDIOGRAM (TEE);  Surgeon: Maude JAYSON Emmer, MD;  Location: [facility];  Service: Cardiovascular;  Laterality: N/A;  Rm. [id number]   TONSILLECTOMY     TOTAL KNEE ARTHROPLASTY  08/15/2011   Procedure: TOTAL KNEE ARTHROPLASTY; lft Surgeon: Norleen LITTIE Gavel, MD;  Location: [facility];  Service: Orthopedics;  Laterality: Left;  RIGHT KNEE CORTIZONE INJECTION   TOTAL KNEE ARTHROPLASTY Left 08/18/2012   Procedure:  Irrigation and debridement of LEFT total knee;  Removal of total knee parts; Implant of spacers;  Surgeon: Dempsey JINNY Sensor, MD;  Location: [facility] OR;  Service: Orthopedics;  Laterality: Left;   TOTAL KNEE ARTHROPLASTY Right 08/12/2013   TOTAL KNEE ARTHROPLASTY Right 08/12/2013   Procedure: RIGHT TOTAL KNEE ARTHROPLASTY;  Surgeon: Dempsey JINNY Sensor, MD;  Location: [facility];  Service: Orthopedics;  Laterality: Right;   TOTAL KNEE REVISION Left 04/19/2013   Procedure: TOTAL KNEE REVISION AND REMOVAL CEMENT SPACER;  Surgeon: Dempsey JINNY Sensor, MD;  Location: [facility];  Service: Orthopedics;  Laterality: Left;   TRANSFORAMINAL  LUMBAR INTERBODY FUSION (TLIF) WITH PEDICLE SCREW FIXATION 1 LEVEL Bilateral 03/12/2019   Procedure: Lumbar four-five Open transforaminal lumbar interbody fusion with Lumbar four-five laminectomy, bilateral Lumbar four-five Pedicle screw placement;  Surgeon: Cheryle Debby LABOR, MD;  Location: [facility];  Service: Neurosurgery;  Laterality: Bilateral;   TUMOR REMOVAL     FROM PELVIS AGE 87    FAMILY HISTORY: Family History  Problem Relation Age of Onset   Arthritis Mother    Hypertension Mother    Heart disease Mother        CHF   Dementia Father    Diabetes type II Father    Heart disease Maternal Grandfather    Breast cancer Paternal Grandmother  dx in her 42s   Lung cancer Maternal Uncle        smoker   Breast cancer Paternal Aunt        dx in her 36s   Ovarian cancer Paternal Aunt        dx in her 30s   Colon cancer Neg Hx    Stomach cancer Neg Hx    Esophageal cancer Neg Hx     SOCIAL HISTORY:  Social History   Socioeconomic History   Marital status: Married    Spouse name: Not on file   Number of children: 2   Years of education: Not on file   Highest education level: Bachelor's degree (e.g., BA, AB, BS)  Occupational History    Employer: [employer]    Occupation: disabled  Tobacco Use   Smoking status: Never   Smokeless tobacco: Never  Vaping Use   Vaping status: Never Used  Substance and Sexual Activity   Alcohol use: No   Drug use: No   Sexual activity: Yes    Birth control/protection: Post-menopausal  Other Topics Concern   Not on file  Social History Narrative   Right Handed   1 cup of Tea per Day   Social Drivers of Health   Financial Resource Strain: Low Risk  (04/07/2023)   Overall Financial Resource Strain (CARDIA)    Difficulty of Paying Living Expenses: Not hard at all  Food Insecurity: No Food Insecurity (04/07/2023)   Hunger Vital Sign    Worried About Running Out of Food in the Last Year: Never true    Ran Out of Food in the  Last Year: Never true  Transportation Needs: No Transportation Needs (04/07/2023)   PRAPARE - Administrator, Civil Service (Medical): No    Lack of Transportation (Non-Medical): No  Physical Activity: Insufficiently Active (04/07/2023)   Exercise Vital Sign    Days of Exercise per Week: 3 days    Minutes of Exercise per Session: 30 min  Stress: Stress Concern Present (04/07/2023)   [employer] of Occupational Health - Occupational Stress Questionnaire    Feeling of Stress : To some extent  Social Connections: Moderately Integrated (04/07/2023)   Social Connection and Isolation Panel [NHANES]    Frequency of Communication with Friends and Family: More than three times a week    Frequency of Social Gatherings with Friends and Family: More than three times a week    Attends Religious Services: Never    Database Administrator or Organizations: Yes    Attends Engineer, Structural: More than 4 times per year    Marital Status: Married  Catering Manager Violence: Not At Risk (12/28/2022)   Humiliation, Afraid, Rape, and Kick questionnaire    Fear of Current or Ex-Partner: No    Emotionally Abused: No    Physically Abused: No    Sexually Abused: No     PHYSICAL EXAM  Vitals:   06/28/23 1533  BP: 127/73  Pulse: (!) 59  Weight: 198 lb 8 oz (90 kg)  Height: 5' 6 (1.676 m)    Body mass index is 32.04 kg/m.   General: The patient is well-developed and well-nourished and in no acute distress.  She has mild swelling but no current bruising at the right lateral forehead.  There is mild tenderness.  Funduscopic examination showed normal optic discs and retinal vessels.  Neurologic Exam  Mental status: The patient is alert and oriented x 3 at the time  of the examination. The patient has apparent normal recent and remote memory, with an apparently normal attention span and concentration ability.   Speech is normal.  Cranial nerves: Extraocular movements are  full.  Facial strength and sensation is normal. The tongue is midline, and the patient has symmetric elevation of the soft palate. No obvious hearing deficits are noted.  Motor:  Muscle bulk is normal.   Muscle tone is normal.  Strength appears to be normal in the legs except right EHL (4/5)  Sensory: Intact sensation to touch and vibration in legs.    Coordination: Finger-nose-finger is performed well...  Gait and station: Station is normal.   The gait is mildly wide.  Tandem gait is wide.   The Romberg is negative.  Reflexes: Deep tendon reflexes are symmetric and normal bilaterally in arms.  DTRs are 1 at the knees.  Deep tendon reflexes absent at the ankles. ____________________________________________________  ASSESSMENT AND PLAN  Multiple sclerosis (HCC)  Postconcussive syndrome  Meningioma (HCC)  OSA (obstructive sleep apnea)  History of breast cancer  Other fatigue  Insomnia, unspecified type  Gait disturbance   1.   Her MS is stable.  She can continue off of a disease modifying therapy.  It is likely that she  has had benefit from having chemotherapy for her breast cancer.  Additionally, most people with MS in the mid 60s are able to stop therapy. 2.   For the postconcussive syndrome I will add imipramine  and have her do a steroid pack.  If no benefit from imipramine  ad HA persists consider zonisamide 3.   Continue  clonazepam  for insomnia.      Imipramine  may also help some 4.  She is followed for her meningiomas by neuro-oncology.   Also has a T2/Flair hyperintense focus on the left that is not typical for MS but has been present since at least 2022 similarly but smaller in 2019   5.  She will return to see us  in 3 months or sooner if she has new or worsening neurologic symptoms.    This visit is part of a comprehensive longitudinal care medical relationship regarding the patients primary diagnosis of multiple sclerosis and related concerns.   Beryl Balz A. Vear, MD,  PhD 06/28/2023, 7:15 PM Certified in Neurology, Clinical Neurophysiology, Sleep Medicine, Pain Medicine and Neuroimaging  [facility] [address] [place], [place] [address] [phone]

## 2023-07-03 DIAGNOSIS — H35341 Macular cyst, hole, or pseudohole, right eye: Secondary | ICD-10-CM | POA: Diagnosis not present

## 2023-07-03 DIAGNOSIS — H43813 Vitreous degeneration, bilateral: Secondary | ICD-10-CM | POA: Diagnosis not present

## 2023-08-29 ENCOUNTER — Other Ambulatory Visit: Payer: Self-pay | Admitting: Family Medicine

## 2023-08-29 DIAGNOSIS — E785 Hyperlipidemia, unspecified: Secondary | ICD-10-CM

## 2023-08-30 DIAGNOSIS — L2989 Other pruritus: Secondary | ICD-10-CM | POA: Diagnosis not present

## 2023-08-30 DIAGNOSIS — L538 Other specified erythematous conditions: Secondary | ICD-10-CM | POA: Diagnosis not present

## 2023-08-30 DIAGNOSIS — R208 Other disturbances of skin sensation: Secondary | ICD-10-CM | POA: Diagnosis not present

## 2023-08-30 DIAGNOSIS — L218 Other seborrheic dermatitis: Secondary | ICD-10-CM | POA: Diagnosis not present

## 2023-08-30 DIAGNOSIS — L570 Actinic keratosis: Secondary | ICD-10-CM | POA: Diagnosis not present

## 2023-08-30 DIAGNOSIS — L820 Inflamed seborrheic keratosis: Secondary | ICD-10-CM | POA: Diagnosis not present

## 2023-08-31 ENCOUNTER — Other Ambulatory Visit: Payer: Self-pay | Admitting: Neurology

## 2023-09-21 ENCOUNTER — Other Ambulatory Visit: Payer: Self-pay | Admitting: Neurology

## 2023-09-21 ENCOUNTER — Other Ambulatory Visit: Payer: Self-pay | Admitting: Family Medicine

## 2023-09-21 ENCOUNTER — Ambulatory Visit (HOSPITAL_COMMUNITY)
Admission: RE | Admit: 2023-09-21 | Discharge: 2023-09-21 | Disposition: A | Discharge status: Home / Self Care | Source: Ambulatory Visit | Attending: Internal Medicine | Admitting: Internal Medicine

## 2023-09-21 DIAGNOSIS — D320 Benign neoplasm of cerebral meninges: Secondary | ICD-10-CM | POA: Diagnosis not present

## 2023-09-21 DIAGNOSIS — D329 Benign neoplasm of meninges, unspecified: Secondary | ICD-10-CM | POA: Diagnosis not present

## 2023-09-21 DIAGNOSIS — G379 Demyelinating disease of central nervous system, unspecified: Secondary | ICD-10-CM | POA: Diagnosis not present

## 2023-09-21 DIAGNOSIS — G35 Multiple sclerosis: Secondary | ICD-10-CM | POA: Diagnosis not present

## 2023-09-21 DIAGNOSIS — C50919 Malignant neoplasm of unspecified site of unspecified female breast: Secondary | ICD-10-CM | POA: Diagnosis not present

## 2023-09-21 DIAGNOSIS — R9089 Other abnormal findings on diagnostic imaging of central nervous system: Secondary | ICD-10-CM | POA: Insufficient documentation

## 2023-09-21 DIAGNOSIS — I10 Essential (primary) hypertension: Secondary | ICD-10-CM

## 2023-09-21 MED ORDER — GADOBUTROL 1 MMOL/ML IV SOLN
9.0000 mL | Freq: Once | INTRAVENOUS | Status: AC | PRN
  Administered 2023-09-21: 9 mL via INTRAVENOUS

## 2023-09-22 NOTE — Telephone Encounter (Signed)
 Requested Prescriptions   Pending Prescriptions Disp Refills   clonazePAM (KLONOPIN) 1 MG tablet [Pharmacy Med Name: CLONAZEPAM  1MG   TAB] 90 tablet     Sig: TAKE 1 TABLET BY MOUTH AT  BEDTIME   baclofen (LIORESAL) 10 MG tablet [Pharmacy Med Name: Baclofen 10 MG Oral Tablet] 270 tablet 3    Sig: TAKE 1 TABLET BY MOUTH 3 TIMES  DAILY AS NEEDED FOR MUSCLE  SPASM(S)  Last seen 06/28/23, next appt scheduled 01/10/24  Dispenses   Dispensed Days Supply Quantity Provider Pharmacy  CLONAZEPAM  1 MG TABS 07/11/2023 90 90 tablet Sater, Pearletha Furl, MD [facility]  CLONAZEPAM  1 MG TABS 03/07/2023 90 90 tablet Sater, Pearletha Furl, MD [facility]  CLONAZEPAM  1 MG TABS 01/03/2023 90 90 tablet Sater, Pearletha Furl, MD [facility]  CLONAZEPAM  1 MG TABS 10/11/2022 90 90 tablet Sater, Pearletha Furl, MD [facility] .[facility]land KitchenMarland Kitchen

## 2023-09-25 ENCOUNTER — Inpatient Hospital Stay: Payer: Medicare Other | Attending: Internal Medicine | Admitting: Internal Medicine

## 2023-09-25 VITALS — BP 141/57 | HR 56 | Temp 97.5°F | Resp 18 | Wt 195.5 lb

## 2023-09-25 DIAGNOSIS — C329 Malignant neoplasm of larynx, unspecified: Secondary | ICD-10-CM | POA: Insufficient documentation

## 2023-09-25 DIAGNOSIS — Z923 Personal history of irradiation: Secondary | ICD-10-CM | POA: Diagnosis not present

## 2023-09-25 DIAGNOSIS — Z801 Family history of malignant neoplasm of trachea, bronchus and lung: Secondary | ICD-10-CM | POA: Diagnosis not present

## 2023-09-25 DIAGNOSIS — Z9012 Acquired absence of left breast and nipple: Secondary | ICD-10-CM | POA: Diagnosis not present

## 2023-09-25 DIAGNOSIS — Z803 Family history of malignant neoplasm of breast: Secondary | ICD-10-CM | POA: Diagnosis not present

## 2023-09-25 DIAGNOSIS — C50411 Malignant neoplasm of upper-outer quadrant of right female breast: Secondary | ICD-10-CM

## 2023-09-25 DIAGNOSIS — Z9221 Personal history of antineoplastic chemotherapy: Secondary | ICD-10-CM | POA: Diagnosis not present

## 2023-09-25 DIAGNOSIS — D329 Benign neoplasm of meninges, unspecified: Secondary | ICD-10-CM

## 2023-09-25 DIAGNOSIS — Z8041 Family history of malignant neoplasm of ovary: Secondary | ICD-10-CM | POA: Diagnosis not present

## 2023-09-25 DIAGNOSIS — Z79811 Long term (current) use of aromatase inhibitors: Secondary | ICD-10-CM | POA: Diagnosis not present

## 2023-09-25 DIAGNOSIS — Z85828 Personal history of other malignant neoplasm of skin: Secondary | ICD-10-CM

## 2023-09-25 MED ORDER — MECLIZINE HCL 50 MG PO TABS: 50.0000 mg | ORAL_TABLET | Freq: Three times a day (TID) | ORAL | 1 refills | Status: DC | PRN

## 2023-09-25 NOTE — Progress Notes (Signed)
 [facility] [address]  [place], [place] [address] [phone]   Interval Evaluation  Date of Service: 09/25/23 Patient Name: Julie Ross Patient MRN: [id number] Patient DOB: 14-May-1961 Provider: Henreitta Leber, MD  Identifying Statement:  Julie Ross is a 63 y.o. female with  multifocal  meningioma    Oncologic History: Oncology History  Breast cancer of upper-outer quadrant of right female breast (HCC)  11/22/2011 Initial Diagnosis   Cancer of upper-outer quadrant of female breast   12/19/2011 Surgery   Left mastectomy with SLN biopsy 2 foci of invasive ductal carcinoma 0.8 and 0.6 cm low-grade ER PR positive HER-2 positive ratio 3.27 Ki-67 11% one lymph node positive out of 4 (T1, N1, M0 stage II)   02/16/2012 - 04/14/2013 Chemotherapy   Taxotere, carboplatin, Herceptin x2 cycles complicated by bacteremia and left knee sepsis requiring revision of the knee. Herceptin maintenance every 3 weeks. Herceptin maintenance completed 04/14/2013   04/17/2012 - 06/04/2012 Radiation Therapy   Radiation therapy to the breast   02/14/2013 Genetic Testing   Negative genetic testing on the Breast/Ovarian Cancer panel.  The Breast/Ovarian gene panel offered by [facility] includes sequencing and rearrangement analysis for the following 21 genes:  ATM, BARD1, BRCA1, BRCA2, BRIP1, CDH1, CHEK2, EPCAM, FANCC, MLH1, MSH2, MSH6, NBN, PALB2, PMS2, PTEN, STK11, RAD51C, RAD51D, TP53, and XRCC2.   The report date is February 14, 2013.   03/13/2013 -  Anti-estrogen oral therapy   Letrozole 2.5 mg once daily     CNS Oncologic History 10/15/20: SRS to two meningomas, L sphenoid wing and L tentorium Mitzi Hansen)  Interval History: Julie Ross presents today for follow up after recent MRI brain.  Today she describes worsening vertigo symptoms over the past couple of weeks.  It is not positionally associated, duration is "hours", intensity is "moderate".  It is different  in character from her prior post-concussion vertigo spells.  No seizures, but does describe increased headache burden.  Sleep has been very inconsistent, at times only 4-6 hours at night.   (H+P) Patient presents today for follow up after completing post-SRS MRI study.  She describes no new or progressive neurologic deficits.  No facial weakness, numbness, or double vision.  Continues to follow with Dr. Epimenio Foot for history of MS.  Functional status is independent, doesn't require assistance with ADLs.  Medications: Current Outpatient Medications on File Prior to Visit  Medication Sig Dispense Refill   Ascorbic Acid (VITAMIN C PO) Take by mouth.     aspirin EC 81 MG tablet Take 1 tablet (81 mg total) by mouth daily. Swallow whole. 90 tablet 3   atorvastatin (LIPITOR) 20 MG tablet TAKE 1 TABLET BY MOUTH ONCE  DAILY 100 tablet 2   baclofen (LIORESAL) 10 MG tablet TAKE 1 TABLET BY MOUTH 3 TIMES  DAILY AS NEEDED FOR MUSCLE  SPASM(S) 270 tablet 3   buPROPion (WELLBUTRIN XL) 150 MG 24 hr tablet TAKE 1 TABLET BY MOUTH DAILY 90 tablet 3   clonazePAM (KLONOPIN) 1 MG tablet TAKE 1 TABLET BY MOUTH AT  BEDTIME 90 tablet 1   EPINEPHrine 0.3 mg/0.3 mL IJ SOAJ injection Inject 0.3 mg into the muscle as needed for anaphylaxis. 1 each 3   gabapentin (NEURONTIN) 600 MG tablet TAKE 1 TABLET BY MOUTH 3 TIMES  DAILY 300 tablet 2   levothyroxine (SYNTHROID) 88 MCG tablet TAKE 1 TABLET BY MOUTH DAILY  BEFORE BREAKFAST 100 tablet 2   losartan-hydrochlorothiazide (HYZAAR)  100-25 MG tablet TAKE 1 TABLET BY MOUTH EVERY  MORNING BEFORE BREAKFAST 100 tablet 2   omeprazole (PRILOSEC) 40 MG capsule TAKE 1 CAPSULE BY MOUTH DAILY 100 capsule 2   ondansetron (ZOFRAN) 4 MG tablet Take 1 tablet (4 mg total) by mouth every 8 (eight) hours as needed for nausea or vomiting. 30 tablet 1   potassium chloride SA (KLOR-CON M) 20 MEQ tablet TAKE 1 TABLET BY MOUTH DAILY 100 tablet 2   sertraline (ZOLOFT) 100 MG tablet TAKE 1 TABLET BY MOUTH  DAILY 90 tablet 3   No current facility-administered medications on file prior to visit.    Allergies:  Allergies  Allergen Reactions   Bee Venom Anaphylaxis   Fentanyl Other (See Comments)    Very low BP, decrease LOC-received Narcan-in 2020   Past Medical History:  Past Medical History:  Diagnosis Date   Anemia    h/o - ? bld. transfusion - more recent- ?2013   Anxiety    uses xanax mainly for sleep   Arthritis    knees    Basal cell carcinoma 01/11/2005   rim left nostril-,mohs   BCC (basal cell carcinoma) 01/11/2005   right cheek mid-mohs   BCC (basal cell carcinoma) 09/12/2006   right forehead -mohs   BCC (basal cell carcinoma) 01/19/2016   right upper arm -[id number]   BCC (basal cell carcinoma) 02/18/2019   forehead-mohs   Breast cancer (HCC) 11/2011   lul/ER+PR+, x1 lymph node    Cancer (HCC)    squamous cell skin cancer x7   Complication of anesthesia    Depression    GERD (gastroesophageal reflux disease)    H/O bone density study 03/2011   H/O colonoscopy    H/O echocardiogram    last one 02/2013, due to chemotherapy   Heart burn    Hx of radiation therapy 04/17/12- 06/04/12   left chest wall, axilla, L supraclavicular fossa 4500 cGy, left mastectomy scar/chest wall 5940 cGy   Hypertension    Hypothyroidism    IBS (irritable bowel syndrome)    Meningioma (HCC) 08/2020   Multiple sclerosis (HCC)    Neuromuscular disorder (HCC)    MS TX WITH CYMBALTA    Personal history of chemotherapy    Personal history of radiation therapy    SCC (squamous cell carcinoma) 01/11/2005   chest mid   Sleep apnea    no CPAP   Squamous cell carcinoma of skin 01/11/2005   over left lip   Wears glasses    Past Surgical History:  Past Surgical History:  Procedure Laterality Date   APPLICATION OF ROBOTIC ASSISTANCE FOR SPINAL PROCEDURE N/A 03/12/2019   Procedure: APPLICATION OF ROBOTIC ASSISTANCE FOR SPINAL PROCEDURE;  Surgeon: Jadene Pierini, MD;  Location: [facility];   Service: Neurosurgery;  Laterality: N/A;   BREAST REDUCTION SURGERY Right 01/25/2021   Procedure: MAMMARY REDUCTION  (BREAST);  Surgeon: Glenna Fellows, MD;  Location:  SURGERY CENTER;  Service: Plastics;  Laterality: Right;   CESAREAN SECTION     X2    COLONOSCOPY     ESOPHAGOGASTRODUODENOSCOPY     FLEXIBLE SIGMOIDOSCOPY     I & D KNEE WITH POLY EXCHANGE  03/02/2012   Procedure: IRRIGATION AND DEBRIDEMENT KNEE WITH POLY EXCHANGE;  Surgeon: Harvie Junior, MD;  Location: [facility] OR;  Service: Orthopedics;  Laterality: Left;  I&D of total knee with Possible Poly Exchange    JOINT REPLACEMENT  07/22/2011   left knee- multiple surgeries  KNEE ARTHROSCOPY     LT KNEE 04/2011   KNEE ARTHROSCOPY  06/20/1982   rt   MASTECTOMY     MASTECTOMY W/ SENTINEL NODE BIOPSY  12/19/2011   Procedure: MASTECTOMY WITH SENTINEL LYMPH NODE BIOPSY;  Surgeon: Currie Paris, MD;  Location: [facility];  Service: General;  Laterality: Left;  left breast and left axilla    MOHS SURGERY     right face   PORT-A-CATH REMOVAL  03/06/2012   Procedure: REMOVAL PORT-A-CATH;  Surgeon: Adolph Pollack, MD;  Location: [facility] OR;  Service: General;  Laterality: N/A;   PORT-A-CATH REMOVAL Right 08/22/2012   Procedure: REMOVAL PORT-A-CATH;  Surgeon: Mariella Saa, MD;  Location: [facility];  Service: General;  Laterality: Right;   PORTACATH PLACEMENT  01/16/2012   Procedure: INSERTION PORT-A-CATH;  Surgeon: Currie Paris, MD;  Location: [facility];  Service: General;  Laterality: Right;  Porta Cath Placement    PORTACATH PLACEMENT  05/01/2012   Procedure: INSERTION PORT-A-CATH;  Surgeon: Currie Paris, MD;  Location: [facility];  Service: General;  Laterality: Right;  right internal jugular port-a-cath insertion   TEE WITHOUT CARDIOVERSION  03/06/2012   Procedure: TRANSESOPHAGEAL ECHOCARDIOGRAM (TEE);  Surgeon: Wendall Stade, MD;  Location: [facility] ENDOSCOPY;  Service: Cardiovascular;   Laterality: N/A;  Rm. [id number]   TONSILLECTOMY     TOTAL KNEE ARTHROPLASTY  08/15/2011   Procedure: TOTAL KNEE ARTHROPLASTY; lft Surgeon: Harvie Junior, MD;  [facility]: [facility];  Service: Orthopedics;  Laterality: Left;  RIGHT KNEE CORTIZONE INJECTION   TOTAL KNEE ARTHROPLASTY Left 08/18/2012   Procedure:  Irrigation and debridement of LEFT total knee;  Removal of total knee parts; Implant of spacers;  Surgeon: Nestor Lewandowsky, MD;  Location: [facility] OR;  Service: Orthopedics;  Laterality: Left;   TOTAL KNEE ARTHROPLASTY Right 08/12/2013   TOTAL KNEE ARTHROPLASTY Right 08/12/2013   Procedure: RIGHT TOTAL KNEE ARTHROPLASTY;  Surgeon: Nestor Lewandowsky, MD;  [facility];  Service: Orthopedics;  Laterality: Right;   TOTAL KNEE REVISION Left 04/19/2013   Procedure: TOTAL KNEE REVISION AND REMOVAL CEMENT SPACER;  Surgeon: Nestor Lewandowsky, MD;  [facility];  Service: Orthopedics;  Laterality: Left;   TRANSFORAMINAL LUMBAR INTERBODY FUSION (TLIF) WITH PEDICLE SCREW FIXATION 1 LEVEL Bilateral 03/12/2019   Procedure: Lumbar four-five Open transforaminal lumbar interbody fusion with Lumbar four-five laminectomy, bilateral Lumbar four-five Pedicle screw placement;  Surgeon: Jadene Pierini, MD;  Location: [facility];  Service: Neurosurgery;  Laterality: Bilateral;   TUMOR REMOVAL     FROM PELVIS AGE 67   Social History:  Social History   Socioeconomic History   Marital status: Married    Spouse name: Not on file   Number of children: 2   Years of education: Not on file   Highest education level: Bachelor's degree (e.g., BA, AB, BS)  Occupational History    Employer: [employer]    Occupation: disabled  Tobacco Use   Smoking status: Never   Smokeless tobacco: Never  Vaping Use   Vaping status: Never Used  Substance and Sexual Activity   Alcohol use: No   Drug use: No   Sexual activity: Yes    Birth control/protection: Post-menopausal  Other Topics Concern   Not on file  Social History Narrative    Right Handed   1 cup of Tea per Day   Social Drivers of Health   Financial Resource Strain: Low Risk  (04/07/2023)   Overall  Financial Resource Strain (CARDIA)    Difficulty of Paying Living Expenses: Not hard at all  Food Insecurity: No Food Insecurity (04/07/2023)   Hunger Vital Sign    Worried About Running Out of Food in the Last Year: Never true    Ran Out of Food in the Last Year: Never true  Transportation Needs: No Transportation Needs (04/07/2023)   PRAPARE - Administrator, Civil Service (Medical): No    Lack of Transportation (Non-Medical): No  Physical Activity: Insufficiently Active (04/07/2023)   Exercise Vital Sign    Days of Exercise per Week: 3 days    Minutes of Exercise per Session: 30 min  Stress: Stress Concern Present (04/07/2023)   [employer] of Occupational Health - Occupational Stress Questionnaire    Feeling of Stress : To some extent  Social Connections: Moderately Integrated (04/07/2023)   Social Connection and Isolation Panel [NHANES]    Frequency of Communication with Friends and Family: More than three times a week    Frequency of Social Gatherings with Friends and Family: More than three times a week    Attends Religious Services: Never    Database administrator or Organizations: Yes    Attends Engineer, structural: More than 4 times per year    Marital Status: Married  Catering manager Violence: Not At Risk (12/28/2022)   Humiliation, Afraid, Rape, and Kick questionnaire    Fear of Current or Ex-Partner: No    Emotionally Abused: No    Physically Abused: No    Sexually Abused: No   Family History:  Family History  Problem Relation Age of Onset   Arthritis Mother    Hypertension Mother    Heart disease Mother        CHF   Dementia Father    Diabetes type II Father    Heart disease Maternal Grandfather    Breast cancer Paternal Grandmother        dx in her 72s   Lung cancer Maternal Uncle        smoker    Breast cancer Paternal Aunt        dx in her 29s   Ovarian cancer Paternal Aunt        dx in her 30s   Colon cancer Neg Hx    Stomach cancer Neg Hx    Esophageal cancer Neg Hx     Review of Systems: Constitutional: Doesn't report fevers, chills or abnormal weight loss Eyes: Doesn't report blurriness of vision Ears, nose, mouth, throat, and face: Doesn't report sore throat Respiratory: Doesn't report cough, dyspnea or wheezes Cardiovascular: Doesn't report palpitation, chest discomfort  Gastrointestinal:  Doesn't report nausea, constipation, diarrhea GU: Doesn't report incontinence Skin: Doesn't report skin rashes Neurological: Per HPI Musculoskeletal: Doesn't report joint pain Behavioral/Psych: Doesn't report anxiety  Physical Exam: Vitals:   09/25/23 0936  BP: (!) 141/57  Pulse: (!) 56  Resp: 18  Temp: (!) 97.5 F (36.4 C)  SpO2: 98%     KPS: 90. General: Alert, cooperative, pleasant, in no acute distress Head: Normal EENT: No conjunctival injection or scleral icterus.  Lungs: Resp effort normal Cardiac: Regular rate Abdomen: Non-distended abdomen Skin: No rashes cyanosis or petechiae. Extremities: No clubbing or edema  Neurologic Exam: Mental Status: Awake, alert, attentive to examiner. Oriented to self and environment. Language is fluent with intact comprehension.  Cranial Nerves: Visual acuity is grossly normal. Visual fields are full. Extra-ocular movements intact. No ptosis. Face is symmetric Motor:  Tone and bulk are normal. Power is full in both arms and legs. Reflexes are symmetric, no pathologic reflexes present.  Sensory: Intact to light touch Gait: Normal.   Labs: I have reviewed the data as listed    Component Value Date/Time   NA 135 04/11/2023 1138   NA 134 04/06/2021 0957   NA 138 08/18/2014 1551   K 3.6 04/11/2023 1138   K 3.7 08/18/2014 1551   CL 97 04/11/2023 1138   CL 100 11/30/2012 1029   CO2 27 04/11/2023 1138   CO2 27 08/18/2014  1551   GLUCOSE 93 04/11/2023 1138   GLUCOSE 117 08/18/2014 1551   GLUCOSE 91 11/30/2012 1029   BUN 14 04/11/2023 1138   BUN 17 04/06/2021 0957   BUN 17.2 08/18/2014 1551   CREATININE 1.01 04/11/2023 1138   CREATININE 1.36 (H) 09/30/2020 1412   CREATININE 1.29 (H) 06/26/2017 1603   CREATININE 1.3 (H) 08/18/2014 1551   CALCIUM 9.4 04/11/2023 1138   CALCIUM 9.1 08/18/2014 1551   PROT 7.3 04/11/2023 1138   PROT 7.3 05/31/2018 1545   PROT 7.1 08/18/2014 1551   ALBUMIN 4.5 04/11/2023 1138   ALBUMIN 4.6 05/31/2018 1545   ALBUMIN 4.0 08/18/2014 1551   AST 19 04/11/2023 1138   AST 19 08/18/2014 1551   ALT 14 04/11/2023 1138   ALT 21 08/18/2014 1551   ALKPHOS 106 04/11/2023 1138   ALKPHOS 137 08/18/2014 1551   BILITOT 0.6 04/11/2023 1138   BILITOT 0.6 05/31/2018 1545   BILITOT 0.46 08/18/2014 1551   GFRNONAA >60 01/20/2021 1532   GFRNONAA 44 (L) 09/30/2020 1412   GFRAA 57 (L) 10/09/2019 1458   Lab Results  Component Value Date   WBC 5.1 04/11/2023   NEUTROABS 2.8 04/11/2023   HGB 12.1 04/11/2023   HCT 37.6 04/11/2023   MCV 77.4 (L) 04/11/2023   PLT 232.0 04/11/2023    Imaging: [facility] Clinician Interpretation: I have personally reviewed the CNS images as listed.  My interpretation, in the context of the patient's clinical presentation, is stable disease with regards to meningioma.  Left frontal T2/FLAIR signal abnormality continues to increase gradually  MR BRAIN W WO CONTRAST Result Date: 09/21/2023 CLINICAL DATA:  63 year old female with a history of left tentorium, sphenoid wing meningioma. Multiple sclerosis. Remote history of breast cancer. Restaging. EXAM: MRI HEAD WITHOUT AND WITH CONTRAST TECHNIQUE: Multiplanar, multiecho pulse sequences of the brain and surrounding structures were obtained without and with intravenous contrast. CONTRAST:  9mL GADAVIST GADOBUTROL 1 MMOL/ML IV SOLN COMPARISON:  Brain MRI 03/23/2023 and earlier. FINDINGS: Brain: Dural-based homogeneously  enhancing soft tissue nodularity along the left sphenoid wing, anterior left middle cranial fossa on series 16, image 69 measuring about 17 mm, 16 mm AP on sagittal postcontrast images, is stable since 2023. Minimal regional mass effect and no associated cerebral edema as before. Second smaller nodular and enhancing 6-7 mm meningioma along the inferior left tentorium is also stable on series 19, image 16. No mass effect or edema. No other areas of abnormal intracranial enhancement or dural thickening are identified. No restricted diffusion to suggest acute infarction. No midline shift, ventriculomegaly, extra-axial collection or acute intracranial hemorrhage. Cervicomedullary junction and pituitary are within normal limits. Stable chronic cerebral white matter signal changes, including some vague subcortical white matter FLAIR hyperintensity at the left operculum series 9, image 31. But furthermore, vague cortical FLAIR signal series 9, image 37 redemonstrated those areas are not correlated on T1 or DWI as abnormal. Vaguely abnormal on  T2. As described in October there is no associated enhancement. No obvious progression since that time. No chronic cerebral blood products identified. Mild heterogeneity in the deep gray nuclei, brainstem and cerebellum is stable. Vascular: Major intracranial vascular flow voids are stable. Following contrast the major dural venous sinuses are enhancing and appear to be patent. Skull and upper cervical spine: A small enhancing lesion of the left calvarium on series 16, image 116 was present in 2017, not significantly changed since that time and appears benign. Background bone marrow signal is within normal limits. Negative visible cervical spine and spinal cord. Sinuses/Orbits: Stable, negative. Other: Visible internal auditory structures appear normal. IMPRESSION: 1. Persistent abnormal cortically based FLAIR signal in the left frontal lobe, operculum. This is nonenhancing, difficult  to correlate on the other sequences, but is new compared to remote exams (such as 2017, 2019). As such nonenhancing tumor such as low-grade glioma cannot be excluded. Continued MRI surveillance recommended. 2. Stable small Meningiomas along the left sphenoid wing and left tentorium with no adverse features. 3. No new intracranial abnormality.  Chronic demyelinating disease. Electronically Signed   By: Odessa Fleming M.D.   On: 09/21/2023 13:25     Assessment/Plan Meningioma (HCC)  Julie Ross presents today with vertigo syndrome which best localizes to CNS rather than peripheral.  MRI brain demonstrates stable findings with regards to treated meningiomas; however there is further increase in T2/FLAIR signal abnormality in left frontal lobe, especially when looking back 18 months and 3 years.  This is appearing more representative of slowly infiltrating gliomatosis rather than inflammatory change.   We will discuss potential role for stereotactic biopsy in CNS tumor board meeting this coming week.  We will call after our discussion with further recommendations.    Meclizine may be increased to 50mg  q8 prn in the interim, if tolerated.  We ask that ANAIYA WISINSKI return to clinic in 1 week, or sooner as needed.  All questions were answered. The patient knows to call the clinic with any problems, questions or concerns. No barriers to learning were detected.  The total time spent in the encounter was 40 minutes and more than 50% was on counseling and review of test results   Henreitta Leber, MD Medical Director of Neuro-Oncology [facility] at [place] 09/25/23 4:11 PM

## 2023-09-26 ENCOUNTER — Other Ambulatory Visit: Payer: Self-pay | Admitting: Internal Medicine

## 2023-09-26 ENCOUNTER — Telehealth: Payer: Self-pay | Admitting: Internal Medicine

## 2023-09-26 ENCOUNTER — Telehealth: Payer: Self-pay | Admitting: *Deleted

## 2023-09-26 ENCOUNTER — Other Ambulatory Visit: Payer: Self-pay | Admitting: Radiation Therapy

## 2023-09-26 MED ORDER — MECLIZINE HCL 25 MG PO TABS: 50.0000 mg | ORAL_TABLET | Freq: Three times a day (TID) | ORAL | 2 refills | Status: DC | PRN

## 2023-09-26 NOTE — Telephone Encounter (Signed)
 Patient left VM stating [facility] did not have Meclizine 50 mg tablets in stock to fill prescription, but they have 25 mg. Dr. Barbaraann Cao informed. He ordered new prescription using 25 mg tablets. TCT patient to inform that Dr. Barbaraann Cao sent new prescription using 25 mg tablets. Patient verbalized understanding.

## 2023-09-26 NOTE — Telephone Encounter (Signed)
 Patient scheduled appointments. Patient is aware of all appointment details.

## 2023-10-02 ENCOUNTER — Inpatient Hospital Stay: Admitting: Internal Medicine

## 2023-10-02 ENCOUNTER — Encounter

## 2023-10-02 DIAGNOSIS — D329 Benign neoplasm of meninges, unspecified: Secondary | ICD-10-CM

## 2023-10-02 DIAGNOSIS — R9089 Other abnormal findings on diagnostic imaging of central nervous system: Secondary | ICD-10-CM | POA: Diagnosis not present

## 2023-10-02 DIAGNOSIS — R42 Dizziness and giddiness: Secondary | ICD-10-CM | POA: Diagnosis not present

## 2023-10-02 NOTE — Progress Notes (Signed)
 I connected with Julie Ross on 10/02/23 at  2:30 PM EDT by telephone visit and verified that I am speaking with the correct person using two identifiers.  I discussed the limitations, risks, security and privacy concerns of performing an evaluation and management service by telemedicine and the availability of in-person appointments. I also discussed with the patient that there may be a patient responsible charge related to this service. The patient expressed understanding and agreed to proceed.  Other persons participating in the visit and their role in the encounter:  n/a  Patient's location:  Home Provider's location:  Office Chief Complaint:  Vertigo - Plan: Referral to Neuro Rehab, MR BRAIN W WO CONTRAST  Abnormal finding on MRI of brain - Plan: Referral to Neuro Rehab, MR BRAIN W WO CONTRAST  Meningioma (HCC) - Plan: Referral to Neuro Rehab, MR BRAIN W WO CONTRAST  History of Present Ilness: Julie Ross reports no clinical changes today.  Continues to experience the vertigo, essentially unchanged from prior.  Dosing the meclizine as needed at 50mg  had not made much difference.  No other new or progressive symptoms.  Observations: Language and cognition at baseline  Imaging: MR BRAIN W WO CONTRAST Result Date: 09/21/2023 CLINICAL DATA:  63 year old female with a history of left tentorium, sphenoid wing meningioma. Multiple sclerosis. Remote history of breast cancer. Restaging. EXAM: MRI HEAD WITHOUT AND WITH CONTRAST TECHNIQUE: Multiplanar, multiecho pulse sequences of the brain and surrounding structures were obtained without and with intravenous contrast. CONTRAST:  9mL GADAVIST GADOBUTROL 1 MMOL/ML IV SOLN COMPARISON:  Brain MRI 03/23/2023 and earlier. FINDINGS: Brain: Dural-based homogeneously enhancing soft tissue nodularity along the left sphenoid wing, anterior left middle cranial fossa on series 16, image 69 measuring about 17 mm, 16 mm AP on sagittal postcontrast images, is stable  since 2023. Minimal regional mass effect and no associated cerebral edema as before. Second smaller nodular and enhancing 6-7 mm meningioma along the inferior left tentorium is also stable on series 19, image 16. No mass effect or edema. No other areas of abnormal intracranial enhancement or dural thickening are identified. No restricted diffusion to suggest acute infarction. No midline shift, ventriculomegaly, extra-axial collection or acute intracranial hemorrhage. Cervicomedullary junction and pituitary are within normal limits. Stable chronic cerebral white matter signal changes, including some vague subcortical white matter FLAIR hyperintensity at the left operculum series 9, image 31. But furthermore, vague cortical FLAIR signal series 9, image 37 redemonstrated those areas are not correlated on T1 or DWI as abnormal. Vaguely abnormal on T2. As described in October there is no associated enhancement. No obvious progression since that time. No chronic cerebral blood products identified. Mild heterogeneity in the deep gray nuclei, brainstem and cerebellum is stable. Vascular: Major intracranial vascular flow voids are stable. Following contrast the major dural venous sinuses are enhancing and appear to be patent. Skull and upper cervical spine: A small enhancing lesion of the left calvarium on series 16, image 116 was present in 2017, not significantly changed since that time and appears benign. Background bone marrow signal is within normal limits. Negative visible cervical spine and spinal cord. Sinuses/Orbits: Stable, negative. Other: Visible internal auditory structures appear normal. IMPRESSION: 1. Persistent abnormal cortically based FLAIR signal in the left frontal lobe, operculum. This is nonenhancing, difficult to correlate on the other sequences, but is new compared to remote exams (such as 2017, 2019). As such nonenhancing tumor such as low-grade glioma cannot be excluded. Continued MRI surveillance  recommended. 2. Stable  small Meningiomas along the left sphenoid wing and left tentorium with no adverse features. 3. No new intracranial abnormality.  Chronic demyelinating disease. Electronically Signed   By: Marlise Simpers M.D.   On: 09/21/2023 13:25    Assessment and Plan: Vertigo - Plan: Referral to Neuro Rehab, MR BRAIN W WO CONTRAST  Abnormal finding on MRI of brain - Plan: Referral to Neuro Rehab, MR BRAIN W WO CONTRAST  Meningioma (HCC) - Plan: Referral to Neuro Rehab, MR BRAIN W WO CONTRAST  Case was discussed in detail in CNS tumor board meeting today.  Recommended the following: -Defer biopsy given very minimal change over 18 months, higher surgical risk in dominant hemisphere -Repeat MRI brain in 6 months -Referral for vestibular rehab  She is agreeable with this plan, will let us  know if symptoms remain refractory.  Follow Up Instructions: We ask that Julie Ross return to clinic in 6 months following next brain MRI, or sooner as needed.  I discussed the assessment and treatment plan with the patient.  The patient was provided an opportunity to ask questions and all were answered.  The patient agreed with the plan and demonstrated understanding of the instructions.    The patient was advised to call back or seek an in-person evaluation if the symptoms worsen or if the condition fails to improve as anticipated.    [name] K Cortana Vanderford, MD   I provided 20 minutes of non face-to-face telephone visit time during this encounter, and > 50% was spent counseling as documented under my assessment & plan.

## 2023-10-04 ENCOUNTER — Telehealth: Payer: Self-pay | Admitting: Internal Medicine

## 2023-10-04 ENCOUNTER — Ambulatory Visit: Admitting: Physical Therapy

## 2023-10-04 NOTE — Telephone Encounter (Signed)
 Patient scheduled appointments. Patient is aware of all appointment details.

## 2023-10-10 ENCOUNTER — Encounter: Payer: Self-pay | Admitting: Physical Therapy

## 2023-10-10 ENCOUNTER — Ambulatory Visit: Attending: Internal Medicine | Admitting: Physical Therapy

## 2023-10-10 ENCOUNTER — Other Ambulatory Visit: Payer: Self-pay

## 2023-10-10 VITALS — BP 137/75 | HR 52

## 2023-10-10 DIAGNOSIS — R42 Dizziness and giddiness: Secondary | ICD-10-CM | POA: Diagnosis not present

## 2023-10-10 DIAGNOSIS — R2681 Unsteadiness on feet: Secondary | ICD-10-CM | POA: Diagnosis not present

## 2023-10-10 NOTE — Therapy (Signed)
 OUTPATIENT PHYSICAL THERAPY VESTIBULAR EVALUATION     Patient Name: ONICA DAVIDOVICH MRN: [id number] DOB:[birth year], 63 y.o., female Today's Date: 10/11/2023  END OF SESSION:  PT End of Session - 10/10/23 1235     Visit Number 1    Number of Visits 7    Date for PT Re-Evaluation 11/14/23    Authorization Type [employer] Medicare    PT Start Time 1232    PT Stop Time 1315    PT Time Calculation (min) 43 min    Equipment Utilized During Treatment Gait belt    Activity Tolerance Other (comment)   taken meclizine  within 24 hours of eval   Behavior During Therapy WFL for tasks assessed/performed;Flat affect            Past Medical History:  Diagnosis Date   Anemia    h/o - ? bld. transfusion - more recent- ?2013   Anxiety    uses xanax  mainly for sleep   Arthritis    knees    Basal cell carcinoma 01/11/2005   rim left nostril-,mohs   BCC (basal cell carcinoma) 01/11/2005   right cheek mid-mohs   BCC (basal cell carcinoma) 09/12/2006   right forehead -mohs   BCC (basal cell carcinoma) 01/19/2016   right upper arm -[id number]   BCC (basal cell carcinoma) 02/18/2019   forehead-mohs   Breast cancer (HCC) 11/2011   lul/ER+PR+, x1 lymph node    Cancer (HCC)    squamous cell skin cancer x7   Complication of anesthesia    Depression    GERD (gastroesophageal reflux disease)    H/O bone density study 03/2011   H/O colonoscopy    H/O echocardiogram    last one 02/2013, due to chemotherapy   Heart burn    Hx of radiation therapy 04/17/12- 06/04/12   left chest wall, axilla, L supraclavicular fossa 4500 cGy, left mastectomy scar/chest wall 5940 cGy   Hypertension    Hypothyroidism    IBS (irritable bowel syndrome)    Meningioma (HCC) 08/2020   Multiple sclerosis (HCC)    Neuromuscular disorder (HCC)    MS TX WITH CYMBALTA     Personal history of chemotherapy    Personal history of radiation therapy    SCC (squamous cell carcinoma) 01/11/2005   chest mid   Sleep apnea    no CPAP    Squamous cell carcinoma of skin 01/11/2005   over left lip   Wears glasses    Past Surgical History:  Procedure Laterality Date   APPLICATION OF ROBOTIC ASSISTANCE FOR SPINAL PROCEDURE N/A 03/12/2019   Procedure: APPLICATION OF ROBOTIC ASSISTANCE FOR SPINAL PROCEDURE;  Surgeon: Cannon Champion, MD;  Location: [facility] OR;  Service: Neurosurgery;  Laterality: N/A;   BREAST REDUCTION SURGERY Right 01/25/2021   Procedure: MAMMARY REDUCTION  (BREAST);  Surgeon: Alger Infield, MD;  Location: [facility];  Service: Plastics;  Laterality: Right;   CESAREAN SECTION     X2    COLONOSCOPY     ESOPHAGOGASTRODUODENOSCOPY     FLEXIBLE SIGMOIDOSCOPY     I & D KNEE WITH POLY EXCHANGE  03/02/2012   Procedure: IRRIGATION AND DEBRIDEMENT KNEE WITH POLY EXCHANGE;  Surgeon: Boston Byers, MD;  Location: [facility] OR;  Service: Orthopedics;  Laterality: Left;  I&D of total knee with Possible Poly Exchange    JOINT REPLACEMENT  07/22/2011   left knee- multiple surgeries    KNEE ARTHROSCOPY     LT KNEE 04/2011   KNEE ARTHROSCOPY  06/20/1982   rt   MASTECTOMY     MASTECTOMY W/ SENTINEL NODE BIOPSY  12/19/2011   Procedure: MASTECTOMY WITH SENTINEL LYMPH NODE BIOPSY;  Surgeon: Darcella Earnest, MD;  Location: [facility] OR;  Service: General;  Laterality: Left;  left breast and left axilla    MOHS SURGERY     right face   PORT-A-CATH REMOVAL  03/06/2012   Procedure: REMOVAL PORT-A-CATH;  Surgeon: Harlee Lichtenstein, MD;  Location: [facility] OR;  Service: General;  Laterality: N/A;   PORT-A-CATH REMOVAL Right 08/22/2012   Procedure: REMOVAL PORT-A-CATH;  Surgeon: Quitman Bucy, MD;  Location: [facility] OR;  Service: General;  Laterality: Right;   PORTACATH PLACEMENT  01/16/2012   Procedure: INSERTION PORT-A-CATH;  Surgeon: Darcella Earnest, MD;  Location: [facility];  Service: General;  Laterality: Right;  Porta Cath Placement    PORTACATH PLACEMENT  05/01/2012   Procedure: INSERTION  PORT-A-CATH;  Surgeon: Darcella Earnest, MD;  Location:  SURGERY CENTER;  Service: General;  Laterality: Right;  right internal jugular port-a-cath insertion   TEE WITHOUT CARDIOVERSION  03/06/2012   Procedure: TRANSESOPHAGEAL ECHOCARDIOGRAM (TEE);  Surgeon: Loyde Rule, MD;  [facility] END[facility]COPY;  Service: Cardiovascular;  Laterality: N/A;  Rm. [id number]   TONSILLECTOMY     TOTAL KNEE ARTHROPLASTY  08/15/2011   Procedure: TOTAL KNEE ARTHROPLASTY; lft Surgeon: Boston Byers, MD;  Location: [facility] OR;  Service: Orthopedics;  Laterality: Left;  RIGHT KNEE CORTIZONE INJECTION   TOTAL KNEE ARTHROPLASTY Left 08/18/2012   Procedure:  Irrigation and debridement of LEFT total knee;  Removal of total knee parts; Implant of spacers;  Surgeon: Ilean Mall, MD;  Location: [facility] OR;  Service: Orthopedics;  Laterality: Left;   TOTAL KNEE ARTHROPLASTY Right 08/12/2013   TOTAL KNEE ARTHROPLASTY Right 08/12/2013   Procedure: RIGHT TOTAL KNEE ARTHROPLASTY;  Surgeon: Ilean Mall, MD;  Location: [facility] OR;  Service: Orthopedics;  Laterality: Right;   TOTAL KNEE REVISION Left 04/19/2013   Procedure: TOTAL KNEE REVISION AND REMOVAL CEMENT SPACER;  Surgeon: Ilean Mall, MD;  Location: [facility] OR;  Service: Orthopedics;  Laterality: Left;   TRANSFORAMINAL LUMBAR INTERBODY FUSION (TLIF) WITH PEDICLE SCREW FIXATION 1 LEVEL Bilateral 03/12/2019   Procedure: Lumbar four-five Open transforaminal lumbar interbody fusion with Lumbar four-five laminectomy, bilateral Lumbar four-five Pedicle screw placement;  Surgeon: Cannon Champion, MD;  Location: [facility] OR;  Service: Neurosurgery;  Laterality: Bilateral;   TUMOR REMOVAL     FROM PELVIS AGE 64   Patient Active Problem List   Diagnosis Date Noted   Vitamin D  deficiency 10/10/2022   Cerebral meningioma (HCC) 11/27/2020   Genetic testing 09/11/2020   Obesity (BMI 30-39.9) 07/14/2020   Meningioma (HCC) 06/16/2020   Arthritis 11/04/2019   Insomnia 08/22/2019   Lumbar  radiculopathy 03/12/2019   Spondylolisthesis at L4-L5 level 08/24/2018   Facet syndrome, lumbar 08/24/2018   Chronic SI joint pain 03/30/2018   Delayed sleep phase syndrome 03/30/2018   History of optic neuritis 07/19/2016   Hyperlipidemia 06/18/2015   H/O malignant neoplasm of breast 02/18/2015   Gonalgia 02/18/2015   Relapsing remitting multiple sclerosis (HCC) 02/18/2015   Physical exam 12/17/2014   Other fatigue 10/15/2014   Obstructive sleep apnea 10/15/2014   Restless leg syndrome 10/15/2014   Allergic rhinitis 09/30/2014   Memory loss 10/14/2013   Depression, recurrent (HCC) 08/30/2013   S/P total knee arthroplasty 08/23/2013   Hypothyroidism 05/29/2013   Esophageal reflux 05/29/2013   Hx of  radiation therapy    Essential hypertension, benign 06/11/2012   Multiple sclerosis (HCC) 01/23/2012   Breast cancer of upper-outer quadrant of right female breast (HCC) 11/22/2011   Osteoarthritis of both knees 08/15/2011    PCP: Jess Morita, MD REFERRING PROVIDER: Vaslow, Zachary K, MD  REFERRING DIAG: R42 (ICD-10-CM) - Vertigo R90.89 (ICD-10-CM) - Abnormal finding on MRI of brain D32.9 (ICD-10-CM) - Meningioma (HCC)  THERAPY DIAG:  Dizziness and giddiness - Plan: PT plan of care cert/re-cert  Unsteadiness on feet - Plan: PT plan of care cert/re-cert  ONSET DATE: 10/02/2023 (referral date)  Rationale for Evaluation and Treatment: Rehabilitation  SUBJECTIVE:   SUBJECTIVE STATEMENT: Patient reports that since her fall in September of last year she has near constant headaches. She feels nauseous frequently. Patient is unsure of what caused her initial fall but was walking between two rugs in a resturaunt and fell and hit hard on her R temple. She was seen last year for a few visits for vestibular therapy but only came for 3 visits as she did not feel like it was helping. Patient reports a history of vertigo that started after her fall. She reports a history of  relapsing-remitting MS as well as mengiomas and a tumor vs. MS flair in brain of unknown type that they are closely monitoring and will have a repeat scan in about 6 months. Patient reports that she was told that her MS was stable. Patient reports symptoms include nausea, vertigo, headaches since fall. Patient reports her last MS flair was a couple of years ago. Did take meclizine  this morning; reports that she is unsure if she gets relief with use.   Pt accompanied by: self  PERTINENT HISTORY: relapsing remitting MS, meningiomas (currently stable), menigiomas, and tumor or MS flair that is currently being monitored origin unknown, post-concussive symptoms following fall in Septembers 2024  PAIN:  Are you having pain? No  PRECAUTIONS: Fall  RED FLAGS: None   WEIGHT BEARING RESTRICTIONS: No  FALLS: Has patient fallen in last 6 months? No  LIVING ENVIRONMENT: Lives with: lives with their spouse Lives in: House/apartment Stairs: Yes: Internal: 1 flight steps; on right going up and half a rail on left and External: 3 steps; none Has following equipment at home: Single point cane, Walker - 2 wheeled, Environmental consultant - 4 wheeled, and shower chair  PLOF: Independent  PATIENT GOALS: "To get rid of the dizziness and headaches."   OBJECTIVE:  Note: Objective measures were completed at Evaluation unless otherwise noted.  DIAGNOSTIC FINDINGS:   MR Brain 09/21/2023: IMPRESSION: 1. Persistent abnormal cortically based FLAIR signal in the left frontal lobe, operculum. This is nonenhancing, difficult to correlate on the other sequences, but is new compared to remote exams (such as 2017, 2019). As such nonenhancing tumor such as low-grade glioma cannot be excluded. Continued MRI surveillance recommended. 2. Stable small Meningiomas along the left sphenoid wing and left tentorium with no adverse features. 3. No new intracranial abnormality.  Chronic demyelinating disease.  COGNITION: Overall  cognitive status: Within functional limits for tasks assessed however reports some short term memor changes    SENSATION: Reports none   EDEMA:  Reports   Cervical ROM:    Active A/PROM (deg) eval  Flexion WFL  Extension WFL  Right lateral flexion WFL  Left lateral flexion WFL  Right rotation WFL  Left rotation WFL  (Blank rows = not tested)  PATIENT SURVEYS:   [place] Post Concussion Symptoms Questionnaire:  Compared to before the  accident, do you now (last 24 hours) suffer from:  Headaches 3 = moderate problem  Feeling of Dizziness 3 = moderate problem  Nausea and/or vomiting 2 = mild problem  RPQ-3 (total of first 3 items) 8     Noise sensitivity (easily upset by loud noises) 0 = not experienced  Sleep disturbances 3 = moderate problem  Fatigue, tiring more easily 3 = moderate problem  Being irritable, easily angered: 2 = mild problem  Feeling depressed or tearful 1 = no more of a problem  Feeling frustrated or impatient 2 = mild problem  Forgetfulness, poor memory 2 = mild problem  Poor concentration 1 = no more of a problem  Taking longer to think 2 = mild problem  Blurred vision 1 = no more of a problem  Light sensitivity (easily upset by bright light) 3 = moderate problem  Double vision 1 = no more of a problem  Restlessness 3 = moderate problem  RPQ-13 (total for next 13 items) 24     VESTIBULAR ASSESSMENT:  GENERAL OBSERVATION: wears glasses (bifocals - does not think that they are prism )   SYMPTOM BEHAVIOR: Subjective history: see above, does not ambulate with AD, reports room spinning dizizness when she goes to lay down in bed  Non-Vestibular symptoms: changes in vision, headaches, nausea/vomiting, and migraine symptoms Type of dizziness: Blurred Vision, Imbalance (Disequilibrium), Spinning/Vertigo, Unsteady with head/body turns, Lightheadedness/Faint, and "World moves"  Frequency: everyday  Duration: minutes  Aggravating factors: laying down in  bed, getting up, standing up  Relieving factors: no known relieving factors  Progression of symptoms: unchanged  Deferred further vestibular testing as patient had taken meclizine  this morning                                                                                                                             TREATMENT:   Self Care: Discussed reason for deffering vestibular testing today but explained why will assess next session, PT explained given PMH will monitor closely for central causes of dizziness and explained limitations of PT addressing this type of dizziness, recommend follow up with eye doctor as well, discussed prism  glasses may be something to look into pending on what shows up in upcoming testing   PATIENT EDUCATION: Education details: POC, goal collaboration, see self care session above Person educated: Patient Education method: Explanation Education comprehension: verbalized understanding  HOME EXERCISE PROGRAM:  To be provided as indicated   GOALS: Goals reviewed with patient? Yes  LONG TERM GOALS: Target date: 11/14/2023  Patient will report demonstrate independence with final HEP in order to maintain current gains and continue to progress after physical therapy discharge.   Baseline: To be provided  Goal status: INITIAL  2.  Patient will improve RPQ-3 score to </= 5 per item to indicate reduced severity of post-concussive symptoms to progress towards PLOF.   Baseline:  Goal status: INITIAL  3.  Patient will improve RPQ-13 score to </=  19 per item to indicate reduced severity of post-concussive symptoms to progress towards PLOF.   Baseline: 24 Goal status: INITIAL  4.  Vestibular exam to be performed and relevant LTG set Baseline: To be assessed  Goal status: INITIAL  ASSESSMENT:  CLINICAL IMPRESSION: Patient is a 63 y.o. female who was seen today for physical therapy evaluation and treatment for vertigo. Session limited as patient had taken  meclizine  prior to vestibular session so deferred testing until next session. Patient has a complex history that could potentially contribute to dizziness including relapsing-remitting MS as well as mengiomas and a tumor vs. MS flair in brain of unknown type as well as a fall in September of last year with what sounds like resulting post-concussive symptoms. Patient scoring with moderate-mild levels of impairment on [place]. Will plan for further vestibular testing next session to determine contributors to vertigo; however, PT suspicious for likely central involvement. Patient will benefit from skilled physical therapy services to address dizziness as able.   OBJECTIVE IMPAIRMENTS: decreased balance and dizziness.   ACTIVITY LIMITATIONS: transfers, bed mobility, and locomotion level  PARTICIPATION LIMITATIONS: meal prep, laundry, and community activity  PERSONAL FACTORS: Age, Time since onset of injury/illness/exacerbation, and 3+ comorbidities: see above  are also affecting patient's functional outcome.   REHAB POTENTIAL: Fair complex medical history   CLINICAL DECISION MAKING: Evolving/moderate complexity  EVALUATION COMPLEXITY: Moderate   PLAN:  PT FREQUENCY: 2x/week  PT DURATION: 3 weeks  PLANNED INTERVENTIONS: 97164- PT Re-evaluation, 97110-Therapeutic exercises, 97530- Therapeutic activity, 97112- Neuromuscular re-education, 97535- Self Care, 54098- Manual therapy, and 97116- Gait training  PLAN FOR NEXT SESSION: vestibular testing and update goal as appropriate, suspecting dizziness of central origin given PMH, provide HEP as appropriate or compensatory strategies (adjust frequency/POC as able)   Coreen Devoid, PT, DPT 10/11/2023, 12:19 PM

## 2023-10-13 ENCOUNTER — Encounter: Payer: Medicare Other | Admitting: Family Medicine

## 2023-10-16 ENCOUNTER — Ambulatory Visit: Admitting: Physical Therapy

## 2023-10-19 ENCOUNTER — Ambulatory Visit: Attending: Internal Medicine

## 2023-10-19 VITALS — BP 127/74 | HR 51

## 2023-10-19 DIAGNOSIS — R2681 Unsteadiness on feet: Secondary | ICD-10-CM | POA: Insufficient documentation

## 2023-10-19 DIAGNOSIS — R2689 Other abnormalities of gait and mobility: Secondary | ICD-10-CM | POA: Diagnosis not present

## 2023-10-19 DIAGNOSIS — R42 Dizziness and giddiness: Secondary | ICD-10-CM | POA: Diagnosis not present

## 2023-10-19 DIAGNOSIS — H8111 Benign paroxysmal vertigo, right ear: Secondary | ICD-10-CM | POA: Diagnosis not present

## 2023-10-19 NOTE — Therapy (Signed)
 OUTPATIENT PHYSICAL THERAPY VESTIBULAR TREATMENT     Patient Name: Julie Ross MRN: [id number] DOB:1961/06/04, 63 y.o., female Today's Date: 10/19/2023  END OF SESSION:  PT End of Session - 10/19/23 1053     Visit Number 2    Number of Visits 7    Date for PT Re-Evaluation 11/14/23    Authorization Type [employer]    PT Start Time 1101    PT Stop Time 1140    PT Time Calculation (min) 39 min    Activity Tolerance Patient tolerated treatment well    Behavior During Therapy Flat affect            Past Medical History:  Diagnosis Date   Anemia    h/o - ? bld. transfusion - more recent- ?2013   Anxiety    uses xanax  mainly for sleep   Arthritis    knees    Basal cell carcinoma 01/11/2005   rim left nostril-,mohs   BCC (basal cell carcinoma) 01/11/2005   right cheek mid-mohs   BCC (basal cell carcinoma) 09/12/2006   right forehead -mohs   BCC (basal cell carcinoma) 01/19/2016   right upper arm -[id number]   BCC (basal cell carcinoma) 02/18/2019   forehead-mohs   Breast cancer (HCC) 11/2011   lul/ER+PR+, x1 lymph node    Cancer (HCC)    squamous cell skin cancer x7   Complication of anesthesia    Depression    GERD (gastroesophageal reflux disease)    H/O bone density study 03/2011   H/O colonoscopy    H/O echocardiogram    last one 02/2013, due to chemotherapy   Heart burn    Hx of radiation therapy 04/17/12- 06/04/12   left chest wall, axilla, L supraclavicular fossa 4500 cGy, left mastectomy scar/chest wall 5940 cGy   Hypertension    Hypothyroidism    IBS (irritable bowel syndrome)    Meningioma (HCC) 08/2020   Multiple sclerosis (HCC)    Neuromuscular disorder (HCC)    MS TX WITH CYMBALTA     Personal history of chemotherapy    Personal history of radiation therapy    SCC (squamous cell carcinoma) 01/11/2005   chest mid   Sleep apnea    no CPAP   Squamous cell carcinoma of skin 01/11/2005   over left lip   Wears glasses    Past Surgical History:   Procedure Laterality Date   APPLICATION OF ROBOTIC ASSISTANCE FOR SPINAL PROCEDURE N/A 03/12/2019   Procedure: APPLICATION OF ROBOTIC ASSISTANCE FOR SPINAL PROCEDURE;  Surgeon: Cannon Champion, MD;  Location: [facility] OR;  Service: Neurosurgery;  Laterality: N/A;   BREAST REDUCTION SURGERY Right 01/25/2021   Procedure: MAMMARY REDUCTION  (BREAST);  Surgeon: Alger Infield, MD;  Location: [facility];  Service: Plastics;  Laterality: Right;   CESAREAN SECTION     X2    COLONOSCOPY     ESOPHAGOGASTRODUODENOSCOPY     FLEXIBLE SIGMOIDOSCOPY     I & D KNEE WITH POLY EXCHANGE  03/02/2012   Procedure: IRRIGATION AND DEBRIDEMENT KNEE WITH POLY EXCHANGE;  Surgeon: Boston Byers, MD;  Location: [facility] OR;  Service: Orthopedics;  Laterality: Left;  I&D of total knee with Possible Poly Exchange    JOINT REPLACEMENT  07/22/2011   left knee- multiple surgeries    KNEE ARTHROSCOPY     LT KNEE 04/2011   KNEE ARTHROSCOPY  06/20/1982   rt   MASTECTOMY     MASTECTOMY W/ SENTINEL NODE BIOPSY  12/19/2011  Procedure: MASTECTOMY WITH SENTINEL LYMPH NODE BIOPSY;  Surgeon: Darcella Earnest, MD;  Location: [facility] OR;  Service: General;  Laterality: Left;  left breast and left axilla    MOHS SURGERY     right face   PORT-A-CATH REMOVAL  03/06/2012   Procedure: REMOVAL PORT-A-CATH;  Surgeon: Harlee Lichtenstein, MD;  Location: [facility] OR;  Service: General;  Laterality: N/A;   PORT-A-CATH REMOVAL Right 08/22/2012   Procedure: REMOVAL PORT-A-CATH;  Surgeon: Quitman Bucy, MD;  Location: [facility];  Service: General;  Laterality: Right;   PORTACATH PLACEMENT  01/16/2012   Procedure: INSERTION PORT-A-CATH;  Surgeon: Darcella Earnest, MD;  Location: [facility];  Service: General;  Laterality: Right;  Porta Cath Placement    PORTACATH PLACEMENT  05/01/2012   Procedure: INSERTION PORT-A-CATH;  Surgeon: Darcella Earnest, MD;  Location: [facility];  Service: General;  Laterality:  Right;  right internal jugular port-a-cath insertion   TEE WITHOUT CARDIOVERSION  03/06/2012   Procedure: TRANSESOPHAGEAL ECHOCARDIOGRAM (TEE);  Surgeon: Loyde Rule, MD;  Location: [facility];  Service: Cardiovascular;  Laterality: N/A;  Rm. [id number]   TONSILLECTOMY     TOTAL KNEE ARTHROPLASTY  08/15/2011   Procedure: TOTAL KNEE ARTHROPLASTY; lft Surgeon: Boston Byers, MD;  Location: [facility];  Service: Orthopedics;  Laterality: Left;  RIGHT KNEE CORTIZONE INJECTION   TOTAL KNEE ARTHROPLASTY Left 08/18/2012   Procedure:  Irrigation and debridement of LEFT total knee;  Removal of total knee parts; Implant of spacers;  Surgeon: Ilean Mall, MD;  Location: [facility] OR;  Service: Orthopedics;  Laterality: Left;   TOTAL KNEE ARTHROPLASTY Right 08/12/2013   TOTAL KNEE ARTHROPLASTY Right 08/12/2013   Procedure: RIGHT TOTAL KNEE ARTHROPLASTY;  Surgeon: Ilean Mall, MD;  Location: [facility];  Service: Orthopedics;  Laterality: Right;   TOTAL KNEE REVISION Left 04/19/2013   Procedure: TOTAL KNEE REVISION AND REMOVAL CEMENT SPACER;  Surgeon: Ilean Mall, MD;  Location: [facility] OR;  Service: Orthopedics;  Laterality: Left;   TRANSFORAMINAL LUMBAR INTERBODY FUSION (TLIF) WITH PEDICLE SCREW FIXATION 1 LEVEL Bilateral 03/12/2019   Procedure: Lumbar four-five Open transforaminal lumbar interbody fusion with Lumbar four-five laminectomy, bilateral Lumbar four-five Pedicle screw placement;  Surgeon: Cannon Champion, MD;  Location: [facility] OR;  Service: Neurosurgery;  Laterality: Bilateral;   TUMOR REMOVAL     FROM PELVIS AGE 40   Patient Active Problem List   Diagnosis Date Noted   Vitamin D  deficiency 10/10/2022   Cerebral meningioma (HCC) 11/27/2020   Genetic testing 09/11/2020   Obesity (BMI 30-39.9) 07/14/2020   Meningioma (HCC) 06/16/2020   Arthritis 11/04/2019   Insomnia 08/22/2019   Lumbar radiculopathy 03/12/2019   Spondylolisthesis at L4-L5 level 08/24/2018   Facet syndrome, lumbar 08/24/2018   Chronic SI  joint pain 03/30/2018   Delayed sleep phase syndrome 03/30/2018   History of optic neuritis 07/19/2016   Hyperlipidemia 06/18/2015   H/O malignant neoplasm of breast 02/18/2015   Gonalgia 02/18/2015   Relapsing remitting multiple sclerosis (HCC) 02/18/2015   Physical exam 12/17/2014   Other fatigue 10/15/2014   Obstructive sleep apnea 10/15/2014   Restless leg syndrome 10/15/2014   Allergic rhinitis 09/30/2014   Memory loss 10/14/2013   Depression, recurrent (HCC) 08/30/2013   S/P total knee arthroplasty 08/23/2013   Hypothyroidism 05/29/2013   Esophageal reflux 05/29/2013   Hx of radiation therapy    Essential hypertension, benign 06/11/2012   Multiple sclerosis (HCC) 01/23/2012   Breast cancer of  upper-outer quadrant of right female breast (HCC) 11/22/2011   Osteoarthritis of both knees 08/15/2011    PCP: Jess Morita, MD REFERRING PROVIDER: Vaslow, Zachary K, MD  REFERRING DIAG: R42 (ICD-10-CM) - Vertigo R90.89 (ICD-10-CM) - Abnormal finding on MRI of brain D32.9 (ICD-10-CM) - Meningioma (HCC)  THERAPY DIAG:  Dizziness and giddiness  Unsteadiness on feet  Other abnormalities of gait and mobility  ONSET DATE: 10/02/2023 (referral date)  Rationale for Evaluation and Treatment: Rehabilitation  SUBJECTIVE:   SUBJECTIVE STATEMENT: Patient arrives to clinic alone. Took Meclizine  last night. Still gets dizzy when laying down/sitting up/standing up consistently. This lasts for ~30s. Denies falls.   Pt accompanied by: self  PERTINENT HISTORY: relapsing remitting MS, meningiomas (currently stable), menigiomas, and tumor or MS flair that is currently being monitored origin unknown, post-concussive symptoms following fall in Septembers 2024  PAIN:  Are you having pain? No  PRECAUTIONS: Fall  PATIENT GOALS: "To get rid of the dizziness and headaches."   OBJECTIVE:  Note: Objective measures were completed at Evaluation unless otherwise noted.  DIAGNOSTIC  FINDINGS:   MR Brain 09/21/2023: IMPRESSION: 1. Persistent abnormal cortically based FLAIR signal in the left frontal lobe, operculum. This is nonenhancing, difficult to correlate on the other sequences, but is new compared to remote exams (such as 2017, 2019). As such nonenhancing tumor such as low-grade glioma cannot be excluded. Continued MRI surveillance recommended. 2. Stable small Meningiomas along the left sphenoid wing and left tentorium with no adverse features. 3. No new intracranial abnormality.  Chronic demyelinating disease.  VESTIBULAR ASSESSMENT:  GENERAL OBSERVATION: wears glasses (bifocals - does not think that they are prism )   SYMPTOM BEHAVIOR: Subjective history: see above, does not ambulate with AD, reports room spinning dizizness when she goes to lay down in bed  Non-Vestibular symptoms: changes in vision, headaches, nausea/vomiting, and migraine symptoms Type of dizziness: Blurred Vision, Imbalance (Disequilibrium), Spinning/Vertigo, Unsteady with head/body turns, Lightheadedness/Faint, and "World moves"  Frequency: everyday  Duration: minutes  Aggravating factors: laying down in bed, getting up, standing up  Relieving factors: no known relieving factors  Progression of symptoms: unchanged  R roll test: (-) L roll test: (-) R dix hallpike: (+) R upbeating torsional nystagmus lasting ~8s  L dix hallpile: (-)                                                                                                                             TREATMENT:  Canalith repositioning: -R epley x1  -symptoms resolved on recheck   Theract: -reported feeling as though she would pass out->laid back down (no dizziness)   -BP: 150/73, HR 49  -sat up   -BP: 118/73, HR 48  -stood up   -BP: 104/ 65, HR: 52  -sat back down   -132/71, HR: 48  Self care/home management: -held off on prescribing brandt daroff d/t severity of orthostatics  -pathophys of BPPV -hold off on R  sidelying and tipping head  back for eye drops/hair washing/etc  PATIENT EDUCATION: Education details: see above Person educated: Patient Education method: Explanation Education comprehension: verbalized understanding  HOME EXERCISE PROGRAM:  To be provided as indicated   GOALS: Goals reviewed with patient? Yes  LONG TERM GOALS: Target date: 11/14/2023  Patient will report demonstrate independence with final HEP in order to maintain current gains and continue to progress after physical therapy discharge.   Baseline: To be provided  Goal status: INITIAL  2.  Patient will improve RPQ-3 score to </= 5 per item to indicate reduced severity of post-concussive symptoms to progress towards PLOF.   Baseline:  Goal status: INITIAL  3.  Patient will improve RPQ-13 score to </= 19 per item to indicate reduced severity of post-concussive symptoms to progress towards PLOF.   Baseline: 24 Goal status: INITIAL  4.  Patient will demonstrate (-) positional testing to indicate resolution of BPPV  Baseline: (+) R posterior canal canalithiasis  Goal status: REVISED  ASSESSMENT:  CLINICAL IMPRESSION: Patient seen for skilled PT session with emphasis on vestibular assessment and patient education. Her exam was significant for R posterior canal canalithiasis with R torsional upbeating nystagmus with ~2s delayed onset, lasting ~8s of moderate amplitude. This was treated with Epley x1 with resolution of symptoms upon recheck. After sitting up from Epley, patient reports feeling as though she's going to pass out- PT returned patient to supine (no dizziness reported). Orthostatics then assessed and patient was very orthostatic with a ~50 point drop from supine to standing and patient was symptomatic. Of note, she was also bradycardic with little to no compensation for her dropping BP. Discussed with patient that she should f/u with MD regarding this. Patient verbalized understanding. Continue POC.    OBJECTIVE IMPAIRMENTS: decreased balance and dizziness.   ACTIVITY LIMITATIONS: transfers, bed mobility, and locomotion level  PARTICIPATION LIMITATIONS: meal prep, laundry, and community activity  PERSONAL FACTORS: Age, Time since onset of injury/illness/exacerbation, and 3+ comorbidities: see above  are also affecting patient's functional outcome.   REHAB POTENTIAL: Fair complex medical history   CLINICAL DECISION MAKING: Evolving/moderate complexity  EVALUATION COMPLEXITY: Moderate   PLAN:  PT FREQUENCY: 2x/week  PT DURATION: 3 weeks  PLANNED INTERVENTIONS: 97164- PT Re-evaluation, 97110-Therapeutic exercises, 97530- Therapeutic activity, [id number]- Neuromuscular re-education, 97535- Self Care, 21308- Manual therapy, and 97116- Gait training  PLAN FOR NEXT SESSION: recheck positional, did she follow up re BP?   Rebecca Campus, PT Rebecca Campus, PT, DPT, CBIS  10/19/2023, 12:56 PM

## 2023-10-23 ENCOUNTER — Ambulatory Visit: Admitting: Physical Therapy

## 2023-10-23 ENCOUNTER — Encounter: Payer: Self-pay | Admitting: Physical Therapy

## 2023-10-23 VITALS — BP 130/73 | HR 53

## 2023-10-23 DIAGNOSIS — H8111 Benign paroxysmal vertigo, right ear: Secondary | ICD-10-CM

## 2023-10-23 DIAGNOSIS — R42 Dizziness and giddiness: Secondary | ICD-10-CM | POA: Diagnosis not present

## 2023-10-23 DIAGNOSIS — R2689 Other abnormalities of gait and mobility: Secondary | ICD-10-CM | POA: Diagnosis not present

## 2023-10-23 DIAGNOSIS — R2681 Unsteadiness on feet: Secondary | ICD-10-CM | POA: Diagnosis not present

## 2023-10-23 NOTE — Therapy (Signed)
 OUTPATIENT PHYSICAL THERAPY VESTIBULAR TREATMENT     Patient Name: Julie Ross MRN: [id number] DOB:20-Jan-1961, 63 y.o., female Today's Date: 10/23/2023  END OF SESSION:  PT End of Session - 10/23/23 1432     Visit Number 3    Number of Visits 7    Date for PT Re-Evaluation 11/14/23    Authorization Type [employer]    PT Start Time 1017    PT Stop Time 1101    PT Time Calculation (min) 44 min    Activity Tolerance Patient tolerated treatment well    Behavior During Therapy WFL for tasks assessed/performed             Past Medical History:  Diagnosis Date   Anemia    h/o - ? bld. transfusion - more recent- ?2013   Anxiety    uses xanax  mainly for sleep   Arthritis    knees    Basal cell carcinoma 01/11/2005   rim left nostril-,mohs   BCC (basal cell carcinoma) 01/11/2005   right cheek mid-mohs   BCC (basal cell carcinoma) 09/12/2006   right forehead -mohs   BCC (basal cell carcinoma) 01/19/2016   right upper arm -[id number]   BCC (basal cell carcinoma) 02/18/2019   forehead-mohs   Breast cancer (HCC) 11/2011   lul/ER+PR+, x1 lymph node    Cancer (HCC)    squamous cell skin cancer x7   Complication of anesthesia    Depression    GERD (gastroesophageal reflux disease)    H/O bone density study 03/2011   H/O colonoscopy    H/O echocardiogram    last one 02/2013, due to chemotherapy   Heart burn    Hx of radiation therapy 04/17/12- 06/04/12   left chest wall, axilla, L supraclavicular fossa 4500 cGy, left mastectomy scar/chest wall 5940 cGy   Hypertension    Hypothyroidism    IBS (irritable bowel syndrome)    Meningioma (HCC) 08/2020   Multiple sclerosis (HCC)    Neuromuscular disorder (HCC)    MS TX WITH CYMBALTA     Personal history of chemotherapy    Personal history of radiation therapy    SCC (squamous cell carcinoma) 01/11/2005   chest mid   Sleep apnea    no CPAP   Squamous cell carcinoma of skin 01/11/2005   over left lip   Wears glasses     Past Surgical History:  Procedure Laterality Date   APPLICATION OF ROBOTIC ASSISTANCE FOR SPINAL PROCEDURE N/A 03/12/2019   Procedure: APPLICATION OF ROBOTIC ASSISTANCE FOR SPINAL PROCEDURE;  Surgeon: Cannon Champion, MD;  Location: [facility] OR;  Service: Neurosurgery;  Laterality: N/A;   BREAST REDUCTION SURGERY Right 01/25/2021   Procedure: MAMMARY REDUCTION  (BREAST);  Surgeon: Alger Infield, MD;  Location: [facility];  Service: Plastics;  Laterality: Right;   CESAREAN SECTION     X2    COLONOSCOPY     ESOPHAGOGASTRODUODENOSCOPY     FLEXIBLE SIGMOIDOSCOPY     I & D KNEE WITH POLY EXCHANGE  03/02/2012   Procedure: IRRIGATION AND DEBRIDEMENT KNEE WITH POLY EXCHANGE;  Surgeon: Boston Byers, MD;  Location: [facility] OR;  Service: Orthopedics;  Laterality: Left;  I&D of total knee with Possible Poly Exchange    JOINT REPLACEMENT  07/22/2011   left knee- multiple surgeries    KNEE ARTHROSCOPY     LT KNEE 04/2011   KNEE ARTHROSCOPY  06/20/1982   rt   MASTECTOMY     MASTECTOMY W/ SENTINEL NODE  BIOPSY  12/19/2011   Procedure: MASTECTOMY WITH SENTINEL LYMPH NODE BIOPSY;  Surgeon: Darcella Earnest, MD;  Location: [facility];  Service: General;  Laterality: Left;  left breast and left axilla    MOHS SURGERY     right face   PORT-A-CATH REMOVAL  03/06/2012   Procedure: REMOVAL PORT-A-CATH;  Surgeon: Harlee Lichtenstein, MD;  Location: [facility] OR;  Service: General;  Laterality: N/A;   PORT-A-CATH REMOVAL Right 08/22/2012   Procedure: REMOVAL PORT-A-CATH;  Surgeon: Quitman Bucy, MD;  Location: [facility];  Service: General;  Laterality: Right;   PORTACATH PLACEMENT  01/16/2012   Procedure: INSERTION PORT-A-CATH;  Surgeon: Darcella Earnest, MD;  Location: [facility];  Service: General;  Laterality: Right;  Porta Cath Placement    PORTACATH PLACEMENT  05/01/2012   Procedure: INSERTION PORT-A-CATH;  Surgeon: Darcella Earnest, MD;  Location: [facility];   Service: General;  Laterality: Right;  right internal jugular port-a-cath insertion   TEE WITHOUT CARDIOVERSION  03/06/2012   Procedure: TRANSESOPHAGEAL ECHOCARDIOGRAM (TEE);  Surgeon: Loyde Rule, MD;  Location: [facility];  Service: Cardiovascular;  Laterality: N/A;  Rm. [id number]   TONSILLECTOMY     TOTAL KNEE ARTHROPLASTY  08/15/2011   Procedure: TOTAL KNEE ARTHROPLASTY; lft Surgeon: Boston Byers, MD;  Location: [facility];  Service: Orthopedics;  Laterality: Left;  RIGHT KNEE CORTIZONE INJECTION   TOTAL KNEE ARTHROPLASTY Left 08/18/2012   Procedure:  Irrigation and debridement of LEFT total knee;  Removal of total knee parts; Implant of spacers;  Surgeon: Ilean Mall, MD;  Location: [facility] OR;  Service: Orthopedics;  Laterality: Left;   TOTAL KNEE ARTHROPLASTY Right 08/12/2013   TOTAL KNEE ARTHROPLASTY Right 08/12/2013   Procedure: RIGHT TOTAL KNEE ARTHROPLASTY;  Surgeon: Ilean Mall, MD;  Location: [facility];  Service: Orthopedics;  Laterality: Right;   TOTAL KNEE REVISION Left 04/19/2013   Procedure: TOTAL KNEE REVISION AND REMOVAL CEMENT SPACER;  Surgeon: Ilean Mall, MD;  Location: [facility];  Service: Orthopedics;  Laterality: Left;   TRANSFORAMINAL LUMBAR INTERBODY FUSION (TLIF) WITH PEDICLE SCREW FIXATION 1 LEVEL Bilateral 03/12/2019   Procedure: Lumbar four-five Open transforaminal lumbar interbody fusion with Lumbar four-five laminectomy, bilateral Lumbar four-five Pedicle screw placement;  Surgeon: Cannon Champion, MD;  Location: [facility] OR;  Service: Neurosurgery;  Laterality: Bilateral;   TUMOR REMOVAL     FROM PELVIS AGE 21   Patient Active Problem List   Diagnosis Date Noted   Vitamin D  deficiency 10/10/2022   Cerebral meningioma (HCC) 11/27/2020   Genetic testing 09/11/2020   Obesity (BMI 30-39.9) 07/14/2020   Meningioma (HCC) 06/16/2020   Arthritis 11/04/2019   Insomnia 08/22/2019   Lumbar radiculopathy 03/12/2019   Spondylolisthesis at L4-L5 level 08/24/2018   Facet syndrome,  lumbar 08/24/2018   Chronic SI joint pain 03/30/2018   Delayed sleep phase syndrome 03/30/2018   History of optic neuritis 07/19/2016   Hyperlipidemia 06/18/2015   H/O malignant neoplasm of breast 02/18/2015   Gonalgia 02/18/2015   Relapsing remitting multiple sclerosis (HCC) 02/18/2015   Physical exam 12/17/2014   Other fatigue 10/15/2014   Obstructive sleep apnea 10/15/2014   Restless leg syndrome 10/15/2014   Allergic rhinitis 09/30/2014   Memory loss 10/14/2013   Depression, recurrent (HCC) 08/30/2013   S/P total knee arthroplasty 08/23/2013   Hypothyroidism 05/29/2013   Esophageal reflux 05/29/2013   Hx of radiation therapy    Essential hypertension, benign 06/11/2012   Multiple sclerosis (HCC) 01/23/2012  Breast cancer of upper-outer quadrant of right female breast (HCC) 11/22/2011   Osteoarthritis of both knees 08/15/2011    PCP: Jess Morita, MD REFERRING PROVIDER: Vaslow, Zachary K, MD  REFERRING DIAG: R42 (ICD-10-CM) - Vertigo R90.89 (ICD-10-CM) - Abnormal finding on MRI of brain D32.9 (ICD-10-CM) - Meningioma (HCC)  THERAPY DIAG:  BPPV (benign paroxysmal positional vertigo), right  Dizziness and giddiness  ONSET DATE: 10/02/2023 (referral date)  Rationale for Evaluation and Treatment: Rehabilitation  SUBJECTIVE:   SUBJECTIVE STATEMENT: Patient states she did not feel good on Friday last week after having the treatment (Epley) on Thursday - states she had lightheadedness most of the day and then dizziness with lying down on Friday.  Says she felt a little better on Saturday; feels that the dizziness/light-headedness is still there. Forgot to follow up with MD regarding orthostatic hypotension. Had some spinning last night when she lied down on her Rt side but states it didn't last as long as it had before   Pt accompanied by: self  PERTINENT HISTORY: relapsing remitting MS, meningiomas (currently stable), menigiomas, and tumor or MS flair that is  currently being monitored origin unknown, post-concussive symptoms following fall in Septembers 2024  PAIN:  Are you having pain? No  PRECAUTIONS: Fall  PATIENT GOALS: "To get rid of the dizziness and headaches."   OBJECTIVE:  Note: Objective measures were completed at Evaluation unless otherwise noted.  DIAGNOSTIC FINDINGS:   MR Brain 09/21/2023: IMPRESSION: 1. Persistent abnormal cortically based FLAIR signal in the left frontal lobe, operculum. This is nonenhancing, difficult to correlate on the other sequences, but is new compared to remote exams (such as 2017, 2019). As such nonenhancing tumor such as low-grade glioma cannot be excluded. Continued MRI surveillance recommended. 2. Stable small Meningiomas along the left sphenoid wing and left tentorium with no adverse features. 3. No new intracranial abnormality.  Chronic demyelinating disease.  VESTIBULAR ASSESSMENT:  GENERAL OBSERVATION: wears glasses (bifocals - does not think that they are prism )   SYMPTOM BEHAVIOR: Subjective history: see above, does not ambulate with AD, reports room spinning dizizness when she goes to lay down in bed  Non-Vestibular symptoms: changes in vision, headaches, nausea/vomiting, and migraine symptoms Type of dizziness: Blurred Vision, Imbalance (Disequilibrium), Spinning/Vertigo, Unsteady with head/body turns, Lightheadedness/Faint, and "World moves"  Frequency: everyday  Duration: minutes  Aggravating factors: laying down in bed, getting up, standing up  Relieving factors: no known relieving factors  Progression of symptoms: unchanged  R roll test: (-) L roll test: (-) R dix hallpike: (+) R upbeating torsional nystagmus lasting ~8s  L dix hallpile: (-)                                                                                                                             TREATMENT:  NeuroRe-ed: Rt Dix-Hallpike test (-) on 1st test; performed a 2nd test to ensure sufficient  cervical extension and this test was (-) also - no nystagmus  and no c/o vertigo in test position; pt did report light-headedness with return to sitting position  Rt sidelying test (-) for nystagmus and c/o vertigo in test position; c/o light-headedness with return to sitting Lt sidelying test (-) for nystagmus and c/o vertigo in test position;  c/o light-headedness with return to sitting position   Self Care: Orthostatics assessment:  Vestibular Assessment - 10/23/23 0001       Orthostatics   BP supine (x 5 minutes) 114/58    HR supine (x 5 minutes) 49    BP sitting 134/68   c/o lightheadedness upon sitting   HR sitting 52    BP standing (after 1 minute) 115/65    HR standing (after 1 minute) 57    BP standing (after 3 minutes) 117/64    HR standing (after 3 minutes) 57            Pt reported moderate dizziness spontaneous onset after lying supine for 3" for orthostatic assessment - moderate intensity Rt rotary upbeating nystagmus noted of approx. 5 sec duration   See pt education below  Canalith Repositioning: Epley maneuver for Rt BPPV posterior canalithiasis performed 2 reps after completion of orthostatic assessment (as nystagmus suddenly occurred after pt lying supine for 3")  Pt had c/o vertigo in position 1 and 3 on 1st rep;  much improvement on 2nd rep with no nystagmus noted and no c/o vertigo in any position on 2nd rep;  pt did report feeling nauseous at end of session - was given Ginger Ale to drink and stated she felt better after approx. 5" seated rest period   Self Care: How to Perform the Epley Maneuver The Epley maneuver is an exercise that relieves symptoms of vertigo. Vertigo is the feeling that you or your surroundings are moving when they are not. When you feel vertigo, you may feel like the room is spinning and may have trouble walking. The Epley maneuver is used for a type of vertigo caused by a calcium  deposit in a part of the inner ear. The maneuver  involves changing head positions to help the deposit move out of the area. You can do this maneuver at home whenever you have symptoms of vertigo. You can repeat it in 24 hours if your vertigo has not gone away. Even though the Epley maneuver may relieve your vertigo for a few weeks, it is possible that your symptoms will return. This maneuver relieves vertigo, but it does not relieve dizziness. What are the risks? If it is done correctly, the Epley maneuver is considered safe. Sometimes it can lead to dizziness or nausea that goes away after a short time. If you develop other symptoms--such as changes in vision, weakness, or numbness--stop doing the maneuver and call your health care provider. Supplies needed: A bed or table. A pillow. How to do the Epley maneuver     Sit on the edge of a bed or table with your back straight and your legs extended or hanging over the edge of the bed or table. Turn your head halfway toward the affected ear or side as told by your health care provider. Lie backward quickly with your head turned until you are lying flat on your back. Your head should dangle (head-hanging position). You may want to position a pillow under your shoulders. Hold this position for at least 30 seconds. If you feel dizzy or have symptoms of vertigo, continue to hold the position until the symptoms stop. Turn your head to  the opposite direction until your unaffected ear is facing down. Your head should continue to dangle. Hold this position for at least 30 seconds. If you feel dizzy or have symptoms of vertigo, continue to hold the position until the symptoms stop. Turn your whole body to the same side as your head so that you are positioned on your side. Your head will now be nearly facedown and no longer needs to dangle. Hold for at least 30 seconds. If you feel dizzy or have symptoms of vertigo, continue to hold the position until the symptoms stop. Sit back up. You can repeat the  maneuver in 24 hours if your vertigo does not go away. Follow these instructions at home: For 24 hours after doing the Epley maneuver: Keep your head in an upright position. When lying down to sleep or rest, keep your head raised (elevated) with two or more pillows. Avoid excessive neck movements. Activity Do not drive or use machinery if you feel dizzy. After doing the Epley maneuver, return to your normal activities as told by your health care provider. Ask your health care provider what activities are safe for you. General instructions Drink enough fluid to keep your urine pale yellow. Do not drink alcohol. Take over-the-counter and prescription medicines only as told by your health care provider. Keep all follow-up visits. This is important. Preventing vertigo symptoms Ask your health care provider if there is anything you should do at home to prevent vertigo. He or she may recommend that you: Keep your head elevated with two or more pillows while you sleep. Do not sleep on the side of your affected ear. Get up slowly from bed. Avoid sudden movements during the day. Avoid extreme head positions or movement, such as looking up or bending over. Contact a health care provider if: Your vertigo gets worse. You have other symptoms, including: Nausea. Vomiting. Headache. Get help right away if you: Have vision changes. Have a headache or neck pain that is severe or getting worse. Cannot stop vomiting. Have new numbness or weakness in any part of your body. These symptoms may represent a serious problem that is an emergency. Do not wait to see if the symptoms will go away. Get medical help right away. Call your local emergency services (911 in the [place] Do not drive yourself to the hospital. Summary Vertigo is the feeling that you or your surroundings are moving when they are not. The Epley maneuver is an exercise that relieves symptoms of vertigo. If the Epley maneuver is done  correctly, it is considered safe. This information is not intended to replace advice given to you by your health care provider. Make sure you discuss any questions you have with your health care provider. Document Revised: 03/03/2023 Document Reviewed: 03/03/2023 Elsevier Patient Education  2024 Elsevier Inc.   Self Treatment for Right Posterior / Anterior Canalithiasis    Sitting on bed: 1. Turn head 45 right. (a) Lie back slowly, shoulders on pillow, head on bed. (b) Hold _20___ seconds. 2. Keeping head on bed, turn head 90 left. Hold ___20_ seconds. 3. Roll to left, head on 45 angle down toward bed. Hold _20___ seconds. 4. Sit up on left side of bed. Repeat __3__ times per session. Do ___2_ sessions per day.  Copyright  VHI. All rights reserved.   PATIENT EDUCATION: Education details: pt given article from [employer] on etiology of BPPV; also gave pt picture of Rt Epley maneuver with written instructions to perform prn for self treatment (  pt stated she had someone at home to help her with this maneuver) Person educated: Patient Education method: Explanation Education comprehension: verbalized understanding  HOME EXERCISE PROGRAM:  To be provided as indicated   GOALS: Goals reviewed with patient? Yes  LONG TERM GOALS: Target date: 11/14/2023  Patient will report demonstrate independence with final HEP in order to maintain current gains and continue to progress after physical therapy discharge.   Baseline: To be provided  Goal status: INITIAL  2.  Patient will improve RPQ-3 score to </= 5 per item to indicate reduced severity of post-concussive symptoms to progress towards PLOF.   Baseline:  Goal status: INITIAL  3.  Patient will improve RPQ-13 score to </= 19 per item to indicate reduced severity of post-concussive symptoms to progress towards PLOF.   Baseline: 24 Goal status: INITIAL  4.  Patient will demonstrate (-) positional testing to indicate resolution of  BPPV  Baseline: (+) R posterior canal canalithiasis  Goal status: REVISED  ASSESSMENT:  CLINICAL IMPRESSION: Patient initially had (-) Rt Dix-Hallpike tests (performed 2 reps for confirmation of negative test) at start of session.  Orthostatics were assessed and pt had sudden onset Rt rotary upbeating nystagmus in supine position, after lying supine for 3"; nystagmus approx. 5 sec duration.  Epley maneuver for Rt BPPV was performed 2 reps after orthostatics assessment completed with (+) test on first rep of Epley; much improvement noted on 2nd rep with no nystagmus and no c/o vertigo in any position of 2nd rep of Epley.   OBJECTIVE IMPAIRMENTS: decreased balance and dizziness.   ACTIVITY LIMITATIONS: transfers, bed mobility, and locomotion level  PARTICIPATION LIMITATIONS: meal prep, laundry, and community activity  PERSONAL FACTORS: Age, Time since onset of injury/illness/exacerbation, and 3+ comorbidities: see above  are also affecting patient's functional outcome.   REHAB POTENTIAL: Fair complex medical history   CLINICAL DECISION MAKING: Evolving/moderate complexity  EVALUATION COMPLEXITY: Moderate   PLAN:  PT FREQUENCY: 2x/week  PT DURATION: 3 weeks  PLANNED INTERVENTIONS: 97164- PT Re-evaluation, 97110-Therapeutic exercises, 97530- Therapeutic activity, [id number]- Neuromuscular re-education, 97535- Self Care, 78295- Manual therapy, and 97116- Gait training  PLAN FOR NEXT SESSION: recheck Rt BPPV - resolved on 10-23-23 with Epley?   Dorsie Gaunt, PT   10/23/2023, 2:34 PM

## 2023-10-23 NOTE — Patient Instructions (Signed)
 How to Perform the Epley Maneuver The Epley maneuver is an exercise that relieves symptoms of vertigo. Vertigo is the feeling that you or your surroundings are moving when they are not. When you feel vertigo, you may feel like the room is spinning and may have trouble walking. The Epley maneuver is used for a type of vertigo caused by a calcium deposit in a part of the inner ear. The maneuver involves changing head positions to help the deposit move out of the area. You can do this maneuver at home whenever you have symptoms of vertigo. You can repeat it in 24 hours if your vertigo has not gone away. Even though the Epley maneuver may relieve your vertigo for a few weeks, it is possible that your symptoms will return. This maneuver relieves vertigo, but it does not relieve dizziness. What are the risks? If it is done correctly, the Epley maneuver is considered safe. Sometimes it can lead to dizziness or nausea that goes away after a short time. If you develop other symptoms--such as changes in vision, weakness, or numbness--stop doing the maneuver and call your health care provider. Supplies needed: A bed or table. A pillow. How to do the Epley maneuver     Sit on the edge of a bed or table with your back straight and your legs extended or hanging over the edge of the bed or table. Turn your head halfway toward the affected ear or side as told by your health care provider. Lie backward quickly with your head turned until you are lying flat on your back. Your head should dangle (head-hanging position). You may want to position a pillow under your shoulders. Hold this position for at least 30 seconds. If you feel dizzy or have symptoms of vertigo, continue to hold the position until the symptoms stop. Turn your head to the opposite direction until your unaffected ear is facing down. Your head should continue to dangle. Hold this position for at least 30 seconds. If you feel dizzy or have symptoms of  vertigo, continue to hold the position until the symptoms stop. Turn your whole body to the same side as your head so that you are positioned on your side. Your head will now be nearly facedown and no longer needs to dangle. Hold for at least 30 seconds. If you feel dizzy or have symptoms of vertigo, continue to hold the position until the symptoms stop. Sit back up. You can repeat the maneuver in 24 hours if your vertigo does not go away. Follow these instructions at home: For 24 hours after doing the Epley maneuver: Keep your head in an upright position. When lying down to sleep or rest, keep your head raised (elevated) with two or more pillows. Avoid excessive neck movements. Activity Do not drive or use machinery if you feel dizzy. After doing the Epley maneuver, return to your normal activities as told by your health care provider. Ask your health care provider what activities are safe for you. General instructions Drink enough fluid to keep your urine pale yellow. Do not drink alcohol. Take over-the-counter and prescription medicines only as told by your health care provider. Keep all follow-up visits. This is important. Preventing vertigo symptoms Ask your health care provider if there is anything you should do at home to prevent vertigo. He or she may recommend that you: Keep your head elevated with two or more pillows while you sleep. Do not sleep on the side of your affected ear. Get  up slowly from bed. Avoid sudden movements during the day. Avoid extreme head positions or movement, such as looking up or bending over. Contact a health care provider if: Your vertigo gets worse. You have other symptoms, including: Nausea. Vomiting. Headache. Get help right away if you: Have vision changes. Have a headache or neck pain that is severe or getting worse. Cannot stop vomiting. Have new numbness or weakness in any part of your body. These symptoms may represent a serious problem  that is an emergency. Do not wait to see if the symptoms will go away. Get medical help right away. Call your local emergency services (911 in the [place] Do not drive yourself to the hospital. Summary Vertigo is the feeling that you or your surroundings are moving when they are not. The Epley maneuver is an exercise that relieves symptoms of vertigo. If the Epley maneuver is done correctly, it is considered safe. This information is not intended to replace advice given to you by your health care provider. Make sure you discuss any questions you have with your health care provider. Document Revised: 03/03/2023 Document Reviewed: 03/03/2023 Elsevier Patient Education  2024 Elsevier Inc.   Self Treatment for Right Posterior / Anterior Canalithiasis    Sitting on bed: 1. Turn head 45 right. (a) Lie back slowly, shoulders on pillow, head on bed. (b) Hold _20___ seconds. 2. Keeping head on bed, turn head 90 left. Hold _20___ seconds. 3. Roll to left, head on 45 angle down toward bed. Hold _20___ seconds. 4. Sit up on left side of bed. Repeat __3__ times per session. Do __2__ sessions per day.  Copyright  VHI. All rights reserved.

## 2023-10-25 ENCOUNTER — Encounter: Admitting: Physical Therapy

## 2023-10-30 ENCOUNTER — Encounter: Admitting: Physical Therapy

## 2023-11-01 ENCOUNTER — Ambulatory Visit: Admitting: Physical Therapy

## 2023-11-01 ENCOUNTER — Encounter: Payer: Self-pay | Admitting: Physical Therapy

## 2023-11-01 VITALS — BP 127/68 | HR 56

## 2023-11-01 DIAGNOSIS — R2689 Other abnormalities of gait and mobility: Secondary | ICD-10-CM

## 2023-11-01 DIAGNOSIS — R2681 Unsteadiness on feet: Secondary | ICD-10-CM | POA: Diagnosis not present

## 2023-11-01 DIAGNOSIS — H8111 Benign paroxysmal vertigo, right ear: Secondary | ICD-10-CM | POA: Diagnosis not present

## 2023-11-01 DIAGNOSIS — R42 Dizziness and giddiness: Secondary | ICD-10-CM | POA: Diagnosis not present

## 2023-11-01 NOTE — Therapy (Signed)
 OUTPATIENT PHYSICAL THERAPY VESTIBULAR TREATMENT     Patient Name: KAYLANY BICKLER MRN: [id number] DOB:[birth year], 63 y.o., female Today's Date: 11/01/2023  END OF SESSION:  PT End of Session - 11/01/23 1327     Visit Number 4    Number of Visits 7    Date for PT Re-Evaluation 11/14/23    Authorization Type [employer]    PT Start Time 1324    PT Stop Time 1339    PT Time Calculation (min) 15 min    Equipment Utilized During Treatment Other (comment)   mat level session   Activity Tolerance Patient tolerated treatment well    Behavior During Therapy WFL for tasks assessed/performed             Past Medical History:  Diagnosis Date   Anemia    h/o - ? bld. transfusion - more recent- ?2013   Anxiety    uses xanax  mainly for sleep   Arthritis    knees    Basal cell carcinoma 01/11/2005   rim left nostril-,mohs   BCC (basal cell carcinoma) 01/11/2005   right cheek mid-mohs   BCC (basal cell carcinoma) 09/12/2006   right forehead -mohs   BCC (basal cell carcinoma) 01/19/2016   right upper arm -[id number]   BCC (basal cell carcinoma) 02/18/2019   forehead-mohs   Breast cancer (HCC) 11/2011   lul/ER+PR+, x1 lymph node    Cancer (HCC)    squamous cell skin cancer x7   Complication of anesthesia    Depression    GERD (gastroesophageal reflux disease)    H/O bone density study 03/2011   H/O colonoscopy    H/O echocardiogram    last one 02/2013, due to chemotherapy   Heart burn    Hx of radiation therapy 04/17/12- 06/04/12   left chest wall, axilla, L supraclavicular fossa 4500 cGy, left mastectomy scar/chest wall 5940 cGy   Hypertension    Hypothyroidism    IBS (irritable bowel syndrome)    Meningioma (HCC) 08/2020   Multiple sclerosis (HCC)    Neuromuscular disorder (HCC)    MS TX WITH CYMBALTA     Personal history of chemotherapy    Personal history of radiation therapy    SCC (squamous cell carcinoma) 01/11/2005   chest mid   Sleep apnea    no CPAP    Squamous cell carcinoma of skin 01/11/2005   over left lip   Wears glasses    Past Surgical History:  Procedure Laterality Date   APPLICATION OF ROBOTIC ASSISTANCE FOR SPINAL PROCEDURE N/A 03/12/2019   Procedure: APPLICATION OF ROBOTIC ASSISTANCE FOR SPINAL PROCEDURE;  Surgeon: Cannon Champion, MD;  Location: [facility] OR;  Service: Neurosurgery;  Laterality: N/A;   BREAST REDUCTION SURGERY Right 01/25/2021   Procedure: MAMMARY REDUCTION  (BREAST);  Surgeon: Alger Infield, MD;  Location: [facility];  Service: Plastics;  Laterality: Right;   CESAREAN SECTION     X2    COLONOSCOPY     ESOPHAGOGASTRODUODENOSCOPY     FLEXIBLE SIGMOIDOSCOPY     I & D KNEE WITH POLY EXCHANGE  03/02/2012   Procedure: IRRIGATION AND DEBRIDEMENT KNEE WITH POLY EXCHANGE;  Surgeon: Boston Byers, MD;  Location: [facility] OR;  Service: Orthopedics;  Laterality: Left;  I&D of total knee with Possible Poly Exchange    JOINT REPLACEMENT  07/22/2011   left knee- multiple surgeries    KNEE ARTHROSCOPY     LT KNEE 04/2011   KNEE ARTHROSCOPY  06/20/1982  rt   MASTECTOMY     MASTECTOMY W/ SENTINEL NODE BIOPSY  12/19/2011   Procedure: MASTECTOMY WITH SENTINEL LYMPH NODE BIOPSY;  Surgeon: Darcella Earnest, MD;  Location: [facility];  Service: General;  Laterality: Left;  left breast and left axilla    MOHS SURGERY     right face   PORT-A-CATH REMOVAL  03/06/2012   Procedure: REMOVAL PORT-A-CATH;  Surgeon: Harlee Lichtenstein, MD;  Location[facility];  Service: General;  Laterality: N/A;   PORT-A-CATH REMOVAL Right 08/22/2012   Procedure: REMOVAL PORT-A-CATH;  Surgeon: Quitman Bucy, MD;  Location: [facility];  Service: General;  Laterality: Right;   PORTACATH PLACEMENT  01/16/2012   Procedure: INSERTION PORT-A-CATH;  Surgeon: Darcella Earnest, MD;  Location: [facility];  Service: General;  Laterality: Right;  Porta Cath Placement    PORTACATH PLACEMENT  05/01/2012   Procedure: INSERTION PORT-A-CATH;   Surgeon: Darcella Earnest, MD;  Location: [facility];  Service: General;  Laterality: Right;  right internal jugular port-a-cath insertion   TEE WITHOUT CARDIOVERSION  03/06/2012   Procedure: TRANSESOPHAGEAL ECHOCARDIOGRAM (TEE);  Surgeon: Loyde Rule, MD;  Location: [facility];  Service: Cardiovascular;  Laterality: N/A;  Rm. [id number]   TONSILLECTOMY     TOTAL KNEE ARTHROPLASTY  08/15/2011   Procedure: TOTAL KNEE ARTHROPLASTY; lft Surgeon: Boston Byers, MD;  Location: [facility];  Service: Orthopedics;  Laterality: Left;  RIGHT KNEE CORTIZONE INJECTION   TOTAL KNEE ARTHROPLASTY Left 08/18/2012   Procedure:  Irrigation and debridement of LEFT total knee;  Removal of total knee parts; Implant of spacers;  Surgeon: Ilean Mall, MD;  Location: [facility] OR;  Service: Orthopedics;  Laterality: Left;   TOTAL KNEE ARTHROPLASTY Right 08/12/2013   TOTAL KNEE ARTHROPLASTY Right 08/12/2013   Procedure: RIGHT TOTAL KNEE ARTHROPLASTY;  Surgeon: Ilean Mall, MD;  Location: [facility];  Service: Orthopedics;  Laterality: Right;   TOTAL KNEE REVISION Left 04/19/2013   Procedure: TOTAL KNEE REVISION AND REMOVAL CEMENT SPACER;  Surgeon: Ilean Mall, MD;  Location: [facility];  Service: Orthopedics;  Laterality: Left;   TRANSFORAMINAL LUMBAR INTERBODY FUSION (TLIF) WITH PEDICLE SCREW FIXATION 1 LEVEL Bilateral 03/12/2019   Procedure: Lumbar four-five Open transforaminal lumbar interbody fusion with Lumbar four-five laminectomy, bilateral Lumbar four-five Pedicle screw placement;  Surgeon: Cannon Champion, MD;  Location: [facility];  Service: Neurosurgery;  Laterality: Bilateral;   TUMOR REMOVAL     FROM PELVIS AGE 62   Patient Active Problem List   Diagnosis Date Noted   Vitamin D  deficiency 10/10/2022   Cerebral meningioma (HCC) 11/27/2020   Genetic testing 09/11/2020   Obesity (BMI 30-39.9) 07/14/2020   Meningioma (HCC) 06/16/2020   Arthritis 11/04/2019   Insomnia 08/22/2019   Lumbar radiculopathy  03/12/2019   Spondylolisthesis at L4-L5 level 08/24/2018   Facet syndrome, lumbar 08/24/2018   Chronic SI joint pain 03/30/2018   Delayed sleep phase syndrome 03/30/2018   History of optic neuritis 07/19/2016   Hyperlipidemia 06/18/2015   H/O malignant neoplasm of breast 02/18/2015   Gonalgia 02/18/2015   Relapsing remitting multiple sclerosis (HCC) 02/18/2015   Physical exam 12/17/2014   Other fatigue 10/15/2014   Obstructive sleep apnea 10/15/2014   Restless leg syndrome 10/15/2014   Allergic rhinitis 09/30/2014   Memory loss 10/14/2013   Depression, recurrent (HCC) 08/30/2013   S/P total knee arthroplasty 08/23/2013   Hypothyroidism 05/29/2013   Esophageal reflux 05/29/2013   Hx of radiation therapy  Essential hypertension, benign 06/11/2012   Multiple sclerosis (HCC) 01/23/2012   Breast cancer of upper-outer quadrant of right female breast (HCC) 11/22/2011   Osteoarthritis of both knees 08/15/2011    PCP: Jess Morita, MD REFERRING PROVIDER: Vaslow, Zachary K, MD  REFERRING DIAG: R42 (ICD-10-CM) - Vertigo R90.89 (ICD-10-CM) - Abnormal finding on MRI of brain D32.9 (ICD-10-CM) - Meningioma (HCC)  THERAPY DIAG:  Dizziness and giddiness  Unsteadiness on feet  Other abnormalities of gait and mobility  BPPV (benign paroxysmal positional vertigo), right  ONSET DATE: 10/02/2023 (referral date)  Rationale for Evaluation and Treatment: Rehabilitation  SUBJECTIVE:   SUBJECTIVE STATEMENT: Patient states she did not feel good on Friday last week after having the treatment (Epley) on Thursday - states she had lightheadedness most of the day and then dizziness with lying down on Friday.  Says she felt a little better on Saturday; feels that the dizziness/light-headedness is still there. Forgot to follow up with MD regarding orthostatic hypotension. Had some spinning last night when she lied down on her Rt side but states it didn't last as long as it had before   Pt  accompanied by: self  PERTINENT HISTORY: relapsing remitting MS, meningiomas (currently stable), menigiomas, and tumor or MS flair that is currently being monitored origin unknown, post-concussive symptoms following fall in Septembers 2024  PAIN:  Are you having pain? No  PRECAUTIONS: Fall  PATIENT GOALS: "To get rid of the dizziness and headaches."   OBJECTIVE:  Note: Objective measures were completed at Evaluation unless otherwise noted.  DIAGNOSTIC FINDINGS:   MR Brain 09/21/2023: IMPRESSION: 1. Persistent abnormal cortically based FLAIR signal in the left frontal lobe, operculum. This is nonenhancing, difficult to correlate on the other sequences, but is new compared to remote exams (such as 2017, 2019). As such nonenhancing tumor such as low-grade glioma cannot be excluded. Continued MRI surveillance recommended. 2. Stable small Meningiomas along the left sphenoid wing and left tentorium with no adverse features. 3. No new intracranial abnormality.  Chronic demyelinating disease.                                                                                                                           TREATMENT:   NeuroRe-ed: Rt Dix-Hallpike test: Positive Dix Hallpike R lasting ~6 seconds with 2 second latency  Canalith Repositioning: Treated 1x with Epley maneuver, patient reporting dizziness in position 1 and 3 and when rechecked [employer] clear  Assessed Brandt-Deroff to L and R and also clear with no reports of dizziness, discussed findings   PATIENT EDUCATION: Education details: see above, findings in today's session Person educated: Patient Education method: Explanation Education comprehension: verbalized understanding  HOME EXERCISE PROGRAM:  To be provided as indicated   GOALS: Goals reviewed with patient? Yes  LONG TERM GOALS: Target date: 11/14/2023  Patient will report demonstrate independence with final HEP in order to maintain current gains and  continue to progress after physical therapy discharge.  Baseline: To be provided  Goal status: INITIAL  2.  Patient will improve RPQ-3 score to </= 5 per item to indicate reduced severity of post-concussive symptoms to progress towards PLOF.   Baseline:  Goal status: INITIAL  3.  Patient will improve RPQ-13 score to </= 19 per item to indicate reduced severity of post-concussive symptoms to progress towards PLOF.   Baseline: 24 Goal status: INITIAL  4.  Patient will demonstrate (-) positional testing to indicate resolution of BPPV  Baseline: (+) R posterior canal canalithiasis  Goal status: REVISED  ASSESSMENT:  CLINICAL IMPRESSION: Patient presenting with (+) Rt Dix-Hallpike with presentation consistent with R posterior canalithiasis.  Resolved with 1x Epley maneuver. To be reassessed next session and if clear plan for discharge.   OBJECTIVE IMPAIRMENTS: decreased balance and dizziness.   ACTIVITY LIMITATIONS: transfers, bed mobility, and locomotion level  PARTICIPATION LIMITATIONS: meal prep, laundry, and community activity  PERSONAL FACTORS: Age, Time since onset of injury/illness/exacerbation, and 3+ comorbidities: see above are also affecting patient's functional outcome.   REHAB POTENTIAL: Fair complex medical history   CLINICAL DECISION MAKING: Evolving/moderate complexity  EVALUATION COMPLEXITY: Moderate   PLAN:  PT FREQUENCY: 2x/week  PT DURATION: 3 weeks  PLANNED INTERVENTIONS: 97164- PT Re-evaluation, 97110-Therapeutic exercises, 97530- Therapeutic activity, [id number]- Neuromuscular re-education, 97535- Self Care, 16109- Manual therapy, and 97116- Gait training  PLAN FOR NEXT SESSION: recheck Rt BPPV - resolved on 11-01-23 with Epley?, reassess and D/C as indicated   Coreen Devoid, PT, DPT   11/01/2023, 2:00 PM

## 2023-11-02 ENCOUNTER — Emergency Department (HOSPITAL_COMMUNITY)

## 2023-11-02 ENCOUNTER — Encounter (HOSPITAL_COMMUNITY): Payer: Self-pay

## 2023-11-02 ENCOUNTER — Other Ambulatory Visit: Payer: Self-pay

## 2023-11-02 ENCOUNTER — Encounter: Payer: Self-pay | Admitting: Physical Therapy

## 2023-11-02 ENCOUNTER — Ambulatory Visit: Admitting: Physical Therapy

## 2023-11-02 ENCOUNTER — Emergency Department (HOSPITAL_COMMUNITY)
Admission: EM | Admit: 2023-11-02 | Discharge: 2023-11-02 | Disposition: A | Discharge status: Home / Self Care | Attending: Emergency Medicine | Admitting: Emergency Medicine

## 2023-11-02 VITALS — BP 147/58 | HR 50

## 2023-11-02 DIAGNOSIS — R6889 Other general symptoms and signs: Secondary | ICD-10-CM | POA: Diagnosis not present

## 2023-11-02 DIAGNOSIS — Z7982 Long term (current) use of aspirin: Secondary | ICD-10-CM | POA: Insufficient documentation

## 2023-11-02 DIAGNOSIS — E039 Hypothyroidism, unspecified: Secondary | ICD-10-CM | POA: Insufficient documentation

## 2023-11-02 DIAGNOSIS — I10 Essential (primary) hypertension: Secondary | ICD-10-CM | POA: Insufficient documentation

## 2023-11-02 DIAGNOSIS — E871 Hypo-osmolality and hyponatremia: Secondary | ICD-10-CM | POA: Insufficient documentation

## 2023-11-02 DIAGNOSIS — I959 Hypotension, unspecified: Secondary | ICD-10-CM | POA: Diagnosis not present

## 2023-11-02 DIAGNOSIS — R2681 Unsteadiness on feet: Secondary | ICD-10-CM | POA: Diagnosis not present

## 2023-11-02 DIAGNOSIS — R2689 Other abnormalities of gait and mobility: Secondary | ICD-10-CM

## 2023-11-02 DIAGNOSIS — R404 Transient alteration of awareness: Secondary | ICD-10-CM | POA: Diagnosis not present

## 2023-11-02 DIAGNOSIS — H8111 Benign paroxysmal vertigo, right ear: Secondary | ICD-10-CM | POA: Diagnosis not present

## 2023-11-02 DIAGNOSIS — I499 Cardiac arrhythmia, unspecified: Secondary | ICD-10-CM | POA: Diagnosis not present

## 2023-11-02 DIAGNOSIS — D649 Anemia, unspecified: Secondary | ICD-10-CM | POA: Diagnosis not present

## 2023-11-02 DIAGNOSIS — R42 Dizziness and giddiness: Secondary | ICD-10-CM

## 2023-11-02 DIAGNOSIS — Z79899 Other long term (current) drug therapy: Secondary | ICD-10-CM | POA: Diagnosis not present

## 2023-11-02 LAB — BASIC METABOLIC PANEL WITH GFR
Anion gap: 12 (ref 5–15)
BUN: 20 mg/dL (ref 8–23)
CO2: 24 mmol/L (ref 22–32)
Calcium: 9.1 mg/dL (ref 8.9–10.3)
Chloride: 95 mmol/L — ABNORMAL LOW (ref 98–111)
Creatinine, Ser: 1.11 mg/dL — ABNORMAL HIGH (ref 0.44–1.00)
GFR, Estimated: 55 mL/min — ABNORMAL LOW (ref >60)
Glucose, Bld: 99 mg/dL (ref 70–99)
Potassium: 3.5 mmol/L (ref 3.5–5.1)
Sodium: 131 mmol/L — ABNORMAL LOW (ref 135–145)

## 2023-11-02 LAB — CBC WITH DIFFERENTIAL/PLATELET
Abs Immature Granulocytes: 0.01 10*3/uL (ref 0.00–0.07)
Basophils Absolute: 0.1 10*3/uL (ref 0.0–0.1)
Basophils Relative: 1 %
Eosinophils Absolute: 0.2 10*3/uL (ref 0.0–0.5)
Eosinophils Relative: 3 %
HCT: 38.1 % (ref 36.0–46.0)
Hemoglobin: 11.9 g/dL — ABNORMAL LOW (ref 12.0–15.0)
Immature Granulocytes: 0 %
Lymphocytes Relative: 29 %
Lymphs Abs: 1.6 10*3/uL (ref 0.7–4.0)
MCH: 24.5 pg — ABNORMAL LOW (ref 26.0–34.0)
MCHC: 31.2 g/dL (ref 30.0–36.0)
MCV: 78.4 fL — ABNORMAL LOW (ref 80.0–100.0)
Monocytes Absolute: 0.5 10*3/uL (ref 0.1–1.0)
Monocytes Relative: 8 %
Neutro Abs: 3.3 10*3/uL (ref 1.7–7.7)
Neutrophils Relative %: 59 %
Platelets: 236 10*3/uL (ref 150–400)
RBC: 4.86 MIL/uL (ref 3.87–5.11)
RDW: 14.5 % (ref 11.5–15.5)
WBC: 5.6 10*3/uL (ref 4.0–10.5)
nRBC: 0 % (ref 0.0–0.2)

## 2023-11-02 LAB — CBG MONITORING, ED: Glucose-Capillary: 92 mg/dL (ref 70–99)

## 2023-11-02 LAB — URINALYSIS, ROUTINE W REFLEX MICROSCOPIC
Bilirubin Urine: NEGATIVE
Glucose, UA: NEGATIVE mg/dL
Hgb urine dipstick: NEGATIVE
Ketones, ur: NEGATIVE mg/dL
Leukocytes,Ua: NEGATIVE
Nitrite: NEGATIVE
Protein, ur: NEGATIVE mg/dL
Specific Gravity, Urine: 1.006 (ref 1.005–1.030)
pH: 6 (ref 5.0–8.0)

## 2023-11-02 NOTE — Discharge Instructions (Signed)
 Today you were seen for dizziness.  I feel this could be due to vertigo and possible vasovagal reaction.  Please return to the ED if you have worsening symptoms or syncopal episodes.  Thank you for letting us  treat you today. After reviewing your labs and imaging, I feel you are safe to go home. Please follow up with your PCP in the next several days and provide them with your records from this visit. Return to the Emergency Room if pain becomes severe or symptoms worsen.

## 2023-11-02 NOTE — ED Provider Notes (Signed)
 [facility] EMERGENCY DEPARTMENT AT [facility] Provider Note   CSN: [id number] Arrival date & time: 11/02/23  1224     History  Chief Complaint  Patient presents with   Dizziness    Julie Ross is a 63 y.o. female past medical history significant for hypertension, GERD, MS, hypothyroid presents today for hypotension.  Patient is currently in vestibular physical therapy and reports feeling extreme episodes of dizziness when doing her therapy maneuvers.  Per EMS the patient's blood pressure dropped from 140 systolic to 98 systolic while doing these maneuvers.  Patient denies any symptoms at this time but does report that she often has these episodes during vestibular PT.  HPI     Home Medications Prior to Admission medications   Medication Sig Start Date End Date Taking? Authorizing Provider  Ascorbic Acid (VITAMIN C PO) Take by mouth.    [provider]  aspirin  EC 81 MG tablet Take 1 tablet (81 mg total) by mouth daily. Swallow whole. 06/29/21   Walker, Caitlin S, NP  atorvastatin  (LIPITOR) 20 MG tablet TAKE 1 TABLET BY MOUTH ONCE  DAILY 08/29/23   Tabori, Katherine E, MD  baclofen  (LIORESAL ) 10 MG tablet TAKE 1 TABLET BY MOUTH 3 TIMES  DAILY AS NEEDED FOR MUSCLE  SPASM(S) 09/22/23   Sater, Sherida Dimmer, MD  buPROPion  (WELLBUTRIN  XL) 150 MG 24 hr tablet TAKE 1 TABLET BY MOUTH DAILY 02/23/23   Tabori, Katherine E, MD  clonazePAM  (KLONOPIN ) 1 MG tablet TAKE 1 TABLET BY MOUTH AT  BEDTIME 09/22/23   Sater, Sherida Dimmer, MD  EPINEPHrine  0.3 mg/0.3 mL IJ SOAJ injection Inject 0.3 mg into the muscle as needed for anaphylaxis. 10/10/22   Tabori, Katherine E, MD  gabapentin  (NEURONTIN ) 600 MG tablet TAKE 1 TABLET BY MOUTH 3 TIMES  DAILY 08/31/23   Sater, Sherida Dimmer, MD  levothyroxine  (SYNTHROID ) 88 MCG tablet TAKE 1 TABLET BY MOUTH DAILY  BEFORE BREAKFAST 09/22/23   Tabori, Katherine E, MD  losartan -hydrochlorothiazide  (HYZAAR ) 100-25 MG tablet TAKE 1 TABLET BY MOUTH EVERY  MORNING BEFORE  BREAKFAST 09/22/23   Tabori, Katherine E, MD  meclizine  (ANTIVERT ) 25 MG tablet Take 2 tablets (50 mg total) by mouth 3 (three) times daily as needed for dizziness. 09/26/23   Vaslow, Zachary K, MD  omeprazole  (PRILOSEC) 40 MG capsule TAKE 1 CAPSULE BY MOUTH DAILY 08/29/23   Tabori, Katherine E, MD  ondansetron  (ZOFRAN ) 4 MG tablet Take 1 tablet (4 mg total) by mouth every 8 (eight) hours as needed for nausea or vomiting. 05/09/23   Tabori, Katherine E, MD  potassium chloride  SA (KLOR-CON  M) 20 MEQ tablet TAKE 1 TABLET BY MOUTH DAILY 08/29/23   Tabori, Katherine E, MD  sertraline  (ZOLOFT ) 100 MG tablet TAKE 1 TABLET BY MOUTH DAILY 01/23/23   Tabori, Katherine E, MD      Allergies    Bee venom and Fentanyl     Review of Systems   Review of Systems  Neurological:  Positive for dizziness.    Physical Exam Updated Vital Signs BP 138/72   Pulse (!) 51   Temp 98.2 F (36.8 C)   Resp 13   LMP 12/05/2011   SpO2 100%  Physical Exam Vitals and nursing note reviewed.  Constitutional:      General: She is not in acute distress.    Appearance: Normal appearance. She is well-developed. She is not ill-appearing, toxic-appearing or diaphoretic.  HENT:     Head: Normocephalic and atraumatic.  Nose: Nose normal.     Mouth/Throat:     Mouth: Mucous membranes are moist.  Eyes:     Extraocular Movements: Extraocular movements intact.     Conjunctiva/sclera: Conjunctivae normal.     Pupils: Pupils are equal, round, and reactive to light.  Cardiovascular:     Rate and Rhythm: Normal rate and regular rhythm.     Pulses: Normal pulses.     Heart sounds: Normal heart sounds. No murmur heard. Pulmonary:     Effort: Pulmonary effort is normal. No respiratory distress.     Breath sounds: Normal breath sounds.  Abdominal:     Palpations: Abdomen is soft.     Tenderness: There is no abdominal tenderness.  Musculoskeletal:        General: No swelling.     Cervical back: Neck supple.  Skin:    General:  Skin is warm and dry.     Capillary Refill: Capillary refill takes less than 2 seconds.  Neurological:     General: No focal deficit present.     Mental Status: She is alert.     Sensory: No sensory deficit.     Motor: No weakness.  Psychiatric:        Mood and Affect: Mood normal.     ED Results / Procedures / Treatments   Labs (all labs ordered are listed, but only abnormal results are displayed) Labs Reviewed  BASIC METABOLIC PANEL WITH GFR - Abnormal; Notable for the following components:      Result Value   Sodium 131 (*)    Chloride 95 (*)    Creatinine, Ser 1.11 (*)    GFR, Estimated 55 (*)    All other components within normal limits  CBC WITH DIFFERENTIAL/PLATELET - Abnormal; Notable for the following components:   Hemoglobin 11.9 (*)    MCV 78.4 (*)    MCH 24.5 (*)    All other components within normal limits  URINALYSIS, ROUTINE W REFLEX MICROSCOPIC - Abnormal; Notable for the following components:   Color, Urine STRAW (*)    All other components within normal limits  CBG MONITORING, ED    EKG None  Radiology DG Chest 2 View Result Date: 11/02/2023 CLINICAL DATA:  Hypotension. EXAM: CHEST - 2 VIEW COMPARISON:  October 09, 2019. FINDINGS: The heart size and mediastinal contours are within normal limits. Both lungs are clear. The visualized skeletal structures are unremarkable. IMPRESSION: No active cardiopulmonary disease. Electronically Signed   By: Rosalene Colon M.D.   On: 11/02/2023 13:25    Procedures Procedures    Medications Ordered in ED Medications - No data to display  ED Course/ Medical Decision Making/ A&P                                 Medical Decision Making  This patient presents to the ED for concern of dizziness, this involves an extensive number of treatment options, and is a complaint that carries with it a high risk of complications and morbidity.  The differential diagnosis includes vertigo, vasovagal syncope, STEMI, NSTEMI,  electrolyte abnormality, arrhythmia   Lab Tests:  I Ordered, and personally interpreted labs.  The pertinent results include: Mild anemia at 11.9, mild hyponatremia at 131, mildly elevated creatinine at 1.11   Imaging Studies ordered:  I ordered imaging studies including chest x-ray I independently visualized and interpreted imaging which showed no active cardiopulmonary disease I agree with  the radiologist interpretation   Cardiac Monitoring: / EKG:  The patient was maintained on a cardiac monitor.  I personally viewed and interpreted the cardiac monitored which showed an underlying rhythm of: Sinus rhythm   Problem List / ED Course / Critical interventions / Medication management  Patient orthostatic negative Patient able to ambulate without assistance and not complaining of any dizziness at this time.   Test / Admission - Considered:  Consider for admission or further workup however patient's vital signs, physical exam, labs, and imaging have been reassuring.  Patient's symptoms likely due to vertigo and possible vasovagal.  Patient given return precautions.  I feel patient safe for discharge at this time.        Final Clinical Impression(s) / ED Diagnoses Final diagnoses:  Dizziness    Rx / DC Orders ED Discharge Orders     None         Merryl Abraham 11/02/23 1526    Dorenda Gandy, MD 11/04/23 2150

## 2023-11-02 NOTE — ED Triage Notes (Signed)
 EMS reports coming from physical therapy, had severe dizziness during. BP on scene 140 siting and 98 standing.  BP 144/80 HR 50 RR 16 Sp02 98 RA CBG 115

## 2023-11-02 NOTE — Therapy (Signed)
 OUTPATIENT PHYSICAL THERAPY VESTIBULAR TREATMENT     Patient Name: Julie Ross MRN: [id number] DOB:01/16/1961, 63 y.o., female Today's Date: 11/02/2023  END OF SESSION:  PT End of Session - 11/02/23 1105     Visit Number 5    Number of Visits 7    Date for PT Re-Evaluation 11/14/23    Authorization Type [employer] Medicare    PT Start Time 1103    PT Stop Time 1204    PT Time Calculation (min) 61 min    Equipment Utilized During Treatment Other (comment)    Activity Tolerance Patient tolerated treatment well    Behavior During Therapy WFL for tasks assessed/performed             Past Medical History:  Diagnosis Date   Anemia    h/o - ? bld. transfusion - more recent- ?2013   Anxiety    uses xanax  mainly for sleep   Arthritis    knees    Basal cell carcinoma 01/11/2005   rim left nostril-,mohs   BCC (basal cell carcinoma) 01/11/2005   right cheek mid-mohs   BCC (basal cell carcinoma) 09/12/2006   right forehead -mohs   BCC (basal cell carcinoma) 01/19/2016   right upper arm -[id number]   BCC (basal cell carcinoma) 02/18/2019   forehead-mohs   Breast cancer (HCC) 11/2011   lul/ER+PR+, x1 lymph node    Cancer (HCC)    squamous cell skin cancer x7   Complication of anesthesia    Depression    GERD (gastroesophageal reflux disease)    H/O bone density study 03/2011   H/O colonoscopy    H/O echocardiogram    last one 02/2013, due to chemotherapy   Heart burn    Hx of radiation therapy 04/17/12- 06/04/12   left chest wall, axilla, L supraclavicular fossa 4500 cGy, left mastectomy scar/chest wall 5940 cGy   Hypertension    Hypothyroidism    IBS (irritable bowel syndrome)    Meningioma (HCC) 08/2020   Multiple sclerosis (HCC)    Neuromuscular disorder (HCC)    MS TX WITH CYMBALTA     Personal history of chemotherapy    Personal history of radiation therapy    SCC (squamous cell carcinoma) 01/11/2005   chest mid   Sleep apnea    no CPAP   Squamous cell carcinoma  of skin 01/11/2005   over left lip   Wears glasses    Past Surgical History:  Procedure Laterality Date   APPLICATION OF ROBOTIC ASSISTANCE FOR SPINAL PROCEDURE N/A 03/12/2019   Procedure: APPLICATION OF ROBOTIC ASSISTANCE FOR SPINAL PROCEDURE;  Surgeon: Cannon Champion, MD;  Location: [facility] OR;  Service: Neurosurgery;  Laterality: N/A;   BREAST REDUCTION SURGERY Right 01/25/2021   Procedure: MAMMARY REDUCTION  (BREAST);  Surgeon: Alger Infield, MD;  Location: [facility];  Service: Plastics;  Laterality: Right;   CESAREAN SECTION     X2    COLONOSCOPY     ESOPHAGOGASTRODUODENOSCOPY     FLEXIBLE SIGMOIDOSCOPY     I & D KNEE WITH POLY EXCHANGE  03/02/2012   Procedure: IRRIGATION AND DEBRIDEMENT KNEE WITH POLY EXCHANGE;  Surgeon: Boston Byers, MD;  Location: [facility] OR;  Service: Orthopedics;  Laterality: Left;  I&D of total knee with Possible Poly Exchange    JOINT REPLACEMENT  07/22/2011   left knee- multiple surgeries    KNEE ARTHROSCOPY     LT KNEE 04/2011   KNEE ARTHROSCOPY  06/20/1982   rt  MASTECTOMY     MASTECTOMY W/ SENTINEL NODE BIOPSY  12/19/2011   Procedure: MASTECTOMY WITH SENTINEL LYMPH NODE BIOPSY;  Surgeon: Darcella Earnest, MD;  Location: [facility];  Service: General;  Laterality: Left;  left breast and left axilla    MOHS SURGERY     right face   PORT-A-CATH REMOVAL  03/06/2012   Procedure: REMOVAL PORT-A-CATH;  Surgeon: Harlee Lichtenstein, MD;  Location: [facility] OR;  Service: General;  Laterality: N/A;   PORT-A-CATH REMOVAL Right 08/22/2012   Procedure: REMOVAL PORT-A-CATH;  Surgeon: Quitman Bucy, MD;  Location: [facility];  Service: General;  Laterality: Right;   PORTACATH PLACEMENT  01/16/2012   Procedure: INSERTION PORT-A-CATH;  Surgeon: Darcella Earnest, MD;  Location: [facility];  Service: General;  Laterality: Right;  Porta Cath Placement    PORTACATH PLACEMENT  05/01/2012   Procedure: INSERTION PORT-A-CATH;  Surgeon: Darcella Earnest, MD;  Location: [facility];  Service: General;  Laterality: Right;  right internal jugular port-a-cath insertion   TEE WITHOUT CARDIOVERSION  03/06/2012   Procedure: TRANSESOPHAGEAL ECHOCARDIOGRAM (TEE);  Surgeon: Loyde Rule, MD;  Location: [facility] END[facility]COPY;  Service: Cardiovascular;  Laterality: N/A;  Rm. [id number]   TONSILLECTOMY     TOTAL KNEE ARTHROPLASTY  08/15/2011   Procedure: TOTAL KNEE ARTHROPLASTY; lft Surgeon: Boston Byers, MD;  Location: [facility];  Service: Orthopedics;  Laterality: Left;  RIGHT KNEE CORTIZONE INJECTION   TOTAL KNEE ARTHROPLASTY Left 08/18/2012   Procedure:  Irrigation and debridement of LEFT total knee;  Removal of total knee parts; Implant of spacers;  Surgeon: Ilean Mall, MD;  Location: [facility] OR;  Service: Orthopedics;  Laterality: Left;   TOTAL KNEE ARTHROPLASTY Right 08/12/2013   TOTAL KNEE ARTHROPLASTY Right 08/12/2013   Procedure: RIGHT TOTAL KNEE ARTHROPLASTY;  Surgeon: Ilean Mall, MD;  Location: [facility];  Service: Orthopedics;  Laterality: Right;   TOTAL KNEE REVISION Left 04/19/2013   Procedure: TOTAL KNEE REVISION AND REMOVAL CEMENT SPACER;  Surgeon: Ilean Mall, MD;  Location: [facility];  Service: Orthopedics;  Laterality: Left;   TRANSFORAMINAL LUMBAR INTERBODY FUSION (TLIF) WITH PEDICLE SCREW FIXATION 1 LEVEL Bilateral 03/12/2019   Procedure: Lumbar four-five Open transforaminal lumbar interbody fusion with Lumbar four-five laminectomy, bilateral Lumbar four-five Pedicle screw placement;  Surgeon: Cannon Champion, MD;  Location: [facility];  Service: Neurosurgery;  Laterality: Bilateral;   TUMOR REMOVAL     FROM PELVIS AGE 89   Patient Active Problem List   Diagnosis Date Noted   Vitamin D  deficiency 10/10/2022   Cerebral meningioma (HCC) 11/27/2020   Genetic testing 09/11/2020   Obesity (BMI 30-39.9) 07/14/2020   Meningioma (HCC) 06/16/2020   Arthritis 11/04/2019   Insomnia 08/22/2019   Lumbar radiculopathy 03/12/2019    Spondylolisthesis at L4-L5 level 08/24/2018   Facet syndrome, lumbar 08/24/2018   Chronic SI joint pain 03/30/2018   Delayed sleep phase syndrome 03/30/2018   History of optic neuritis 07/19/2016   Hyperlipidemia 06/18/2015   H/O malignant neoplasm of breast 02/18/2015   Gonalgia 02/18/2015   Relapsing remitting multiple sclerosis (HCC) 02/18/2015   Physical exam 12/17/2014   Other fatigue 10/15/2014   Obstructive sleep apnea 10/15/2014   Restless leg syndrome 10/15/2014   Allergic rhinitis 09/30/2014   Memory loss 10/14/2013   Depression, recurrent (HCC) 08/30/2013   S/P total knee arthroplasty 08/23/2013   Hypothyroidism 05/29/2013   Esophageal reflux 05/29/2013   Hx of radiation therapy    Essential  hypertension, benign 06/11/2012   Multiple sclerosis (HCC) 01/23/2012   Breast cancer of upper-outer quadrant of right female breast (HCC) 11/22/2011   Osteoarthritis of both knees 08/15/2011    PCP: Jess Morita, MD REFERRING PROVIDER: Vaslow, Zachary K, MD  REFERRING DIAG: R42 (ICD-10-CM) - Vertigo R90.89 (ICD-10-CM) - Abnormal finding on MRI of brain D32.9 (ICD-10-CM) - Meningioma (HCC)  THERAPY DIAG:  Dizziness and giddiness  Unsteadiness on feet  BPPV (benign paroxysmal positional vertigo), right  Other abnormalities of gait and mobility  ONSET DATE: 10/02/2023 (referral date)  Rationale for Evaluation and Treatment: Rehabilitation  SUBJECTIVE:   SUBJECTIVE STATEMENT: Patient reports she felt real bad when she got home and getting out of car and felt a little dizzy this morning but is otherwise doing well. Denies falls and near falls.   Pt accompanied by: self  PERTINENT HISTORY: relapsing remitting MS, meningiomas (currently stable), menigiomas, and tumor or MS flair that is currently being monitored origin unknown, post-concussive symptoms following fall in Septembers 2024  PAIN:  Are you having pain? No  PRECAUTIONS: Fall  PATIENT GOALS: "To get  rid of the dizziness and headaches."   OBJECTIVE:  Note: Objective measures were completed at Evaluation unless otherwise noted.  DIAGNOSTIC FINDINGS:   MR Brain 09/21/2023: IMPRESSION: 1. Persistent abnormal cortically based FLAIR signal in the left frontal lobe, operculum. This is nonenhancing, difficult to correlate on the other sequences, but is new compared to remote exams (such as 2017, 2019). As such nonenhancing tumor such as low-grade glioma cannot be excluded. Continued MRI surveillance recommended. 2. Stable small Meningiomas along the left sphenoid wing and left tentorium with no adverse features. 3. No new intracranial abnormality.  Chronic demyelinating disease.                                                                                                                           TREATMENT:   NeuroRe-ed: Rt Dix-Hallpike test: Positive Dix Hallpike R lasting ~7 seconds with very minimal latency less than 1 second  Canalith Repositioning: Treated 1x with Epley maneuver, patient reporting dizziness in position 1 and 3 and when rechecked [employer] clear; PT rechecked L and R posterior canal as well as roll test: all clear  Upon sitting up from positional testing, patient with strong reports of lightheadedness different than typical dizziness; see below for details   Self Care:  Vitals Start of Session: 108/60 mmHg (RUE) Vitals assessed as noted above at start of session, [facility] Following reports of lightheadedness, PT gets patient supine on mat with legs elevated, initial reading 120s/50s; see below for remainder of BP readings, BP increased with prolonged supine and trialed 2x sitting up but unable to tolerate with drop in BP from 140/57 supine to 98/62 sitting up so returned to supine with legs elevated with regulation of BP, PT asked if patient drove self and reports she did, asked if there was anyone who could get her and so she called  daughter Julie Ross, when BP did not  regulate with sitting up patient eventually agreed for PT to call EMS to safely transport patient for further medical evaluations/fluids if needed, PT monitored patient closely until EMS handoff - patient taken via EMS out of clinic PT stayed with patient monitored and educated on readings for duraiton of session   Today's Vitals   11/02/23 1133 11/02/23 1139 11/02/23 1148 11/02/23 1319  BP: (!) 140/57 98/62 (!) 147/58 113/71  Pulse: (!) 53 (!) 54 (!) 50 93    Supine  Supine  Sitting Up   Supine  PATIENT EDUCATION: Education details: see above, findings in today's session Person educated: Patient Education method: Explanation Education comprehension: verbalized understanding  HOME EXERCISE PROGRAM:  To be provided as indicated   GOALS: Goals reviewed with patient? Yes  LONG TERM GOALS: Target date: 11/14/2023  Patient will report demonstrate independence with final HEP in order to maintain current gains and continue to progress after physical therapy discharge.   Baseline: To be provided  Goal status: INITIAL  2.  Patient will improve RPQ-3 score to </= 5 per item to indicate reduced severity of post-concussive symptoms to progress towards PLOF.   Baseline:  Goal status: INITIAL  3.  Patient will improve RPQ-13 score to </= 19 per item to indicate reduced severity of post-concussive symptoms to progress towards PLOF.   Baseline: 24 Goal status: INITIAL  4.  Patient will demonstrate (-) positional testing to indicate resolution of BPPV  Baseline: (+) R posterior canal canalithiasis  Goal status: REVISED  ASSESSMENT:  CLINICAL IMPRESSION: Patient presenting with (+) Rt Dix-Hallpike with presentation consistent with R posterior canalithiasis that resolved when treated 1x; however, following re-check of positional tests, patient became very symptomatic for lightheadedness and feeling as though could pass out. Patient lays patient supine with drop is diastolic near 10 points  noted, patient reports improvements with prolonged supine lying. Attempt to return to sitting 2x but unable to tolerate. Patient calls daughter to help come and when unable to safely transport out of session, patient agreeable to EMS being called. Patient left via EMS. PT monitored patient as noted above until EMS handoff complete.   OBJECTIVE IMPAIRMENTS: decreased balance and dizziness.   ACTIVITY LIMITATIONS: transfers, bed mobility, and locomotion level  PARTICIPATION LIMITATIONS: meal prep, laundry, and community activity  PERSONAL FACTORS: Age, Time since onset of injury/illness/exacerbation, and 3+ comorbidities: see above are also affecting patient's functional outcome.   REHAB POTENTIAL: Fair complex medical history   CLINICAL DECISION MAKING: Evolving/moderate complexity  EVALUATION COMPLEXITY: Moderate   PLAN:  PT FREQUENCY: 2x/week  PT DURATION: 3 weeks  PLANNED INTERVENTIONS: 97164- PT Re-evaluation, 97110-Therapeutic exercises, 97530- Therapeutic activity, [id number]- Neuromuscular re-education, 97535- Self Care, 19147- Manual therapy, and 97116- Gait training  PLAN FOR NEXT SESSION: recheck Rt BPPV - resolved on 11-01-23 with Epley?, reassess and D/C as indicated, has BP regulated since last here (orthostatic last time?)   Coreen Devoid, PT, DPT   11/02/2023, 1:52 PM

## 2023-11-02 NOTE — ED Notes (Signed)
Patient ambulated well to the bathroom. No complaints.

## 2023-11-28 ENCOUNTER — Ambulatory Visit (INDEPENDENT_AMBULATORY_CARE_PROVIDER_SITE_OTHER): Admitting: Family Medicine

## 2023-11-28 ENCOUNTER — Encounter: Payer: Self-pay | Admitting: Family Medicine

## 2023-11-28 VITALS — BP 120/68 | HR 54 | Temp 98.9°F | Ht 65.5 in | Wt 198.1 lb

## 2023-11-28 DIAGNOSIS — S060X0A Concussion without loss of consciousness, initial encounter: Secondary | ICD-10-CM | POA: Diagnosis not present

## 2023-11-28 DIAGNOSIS — Z114 Encounter for screening for human immunodeficiency virus [HIV]: Secondary | ICD-10-CM

## 2023-11-28 DIAGNOSIS — E559 Vitamin D deficiency, unspecified: Secondary | ICD-10-CM

## 2023-11-28 DIAGNOSIS — Z0000 Encounter for general adult medical examination without abnormal findings: Secondary | ICD-10-CM | POA: Diagnosis not present

## 2023-11-28 DIAGNOSIS — I10 Essential (primary) hypertension: Secondary | ICD-10-CM | POA: Diagnosis not present

## 2023-11-28 LAB — LIPID PANEL
Cholesterol: 144 mg/dL (ref 0–200)
HDL: 73.5 mg/dL (ref >39.00)
LDL Cholesterol: 59 mg/dL (ref 0–99)
NonHDL: 70.38
Total CHOL/HDL Ratio: 2
Triglycerides: 59 mg/dL (ref 0.0–149.0)
VLDL: 11.8 mg/dL (ref 0.0–40.0)

## 2023-11-28 LAB — BASIC METABOLIC PANEL WITH GFR
BUN: 17 mg/dL (ref 6–23)
CO2: 27 meq/L (ref 19–32)
Calcium: 9.1 mg/dL (ref 8.4–10.5)
Chloride: 96 meq/L (ref 96–112)
Creatinine, Ser: 1.08 mg/dL (ref 0.40–1.20)
GFR: 54 mL/min — ABNORMAL LOW (ref >60.00)
Glucose, Bld: 109 mg/dL — ABNORMAL HIGH (ref 70–99)
Potassium: 3.5 meq/L (ref 3.5–5.1)
Sodium: 131 meq/L — ABNORMAL LOW (ref 135–145)

## 2023-11-28 LAB — CBC WITH DIFFERENTIAL/PLATELET
Basophils Absolute: 0.1 10*3/uL (ref 0.0–0.1)
Basophils Relative: 1.1 % (ref 0.0–3.0)
Eosinophils Absolute: 0.2 10*3/uL (ref 0.0–0.7)
Eosinophils Relative: 3 % (ref 0.0–5.0)
HCT: 33.7 % — ABNORMAL LOW (ref 36.0–46.0)
Hemoglobin: 11.3 g/dL — ABNORMAL LOW (ref 12.0–15.0)
Lymphocytes Relative: 33.7 % (ref 12.0–46.0)
Lymphs Abs: 1.7 10*3/uL (ref 0.7–4.0)
MCHC: 33.4 g/dL (ref 30.0–36.0)
MCV: 73 fl — ABNORMAL LOW (ref 78.0–100.0)
Monocytes Absolute: 0.4 10*3/uL (ref 0.1–1.0)
Monocytes Relative: 7.5 % (ref 3.0–12.0)
Neutro Abs: 2.8 10*3/uL (ref 1.4–7.7)
Neutrophils Relative %: 54.7 % (ref 43.0–77.0)
Platelets: 210 10*3/uL (ref 150.0–400.0)
RBC: 4.61 Mil/uL (ref 3.87–5.11)
RDW: 15.6 % — ABNORMAL HIGH (ref 11.5–15.5)
WBC: 5.1 10*3/uL (ref 4.0–10.5)

## 2023-11-28 LAB — HEPATIC FUNCTION PANEL
ALT: 9 U/L (ref 0–35)
AST: 15 U/L (ref 0–37)
Albumin: 4.4 g/dL (ref 3.5–5.2)
Alkaline Phosphatase: 108 U/L (ref 39–117)
Bilirubin, Direct: 0.2 mg/dL (ref 0.0–0.3)
Total Bilirubin: 0.8 mg/dL (ref 0.2–1.2)
Total Protein: 6.8 g/dL (ref 6.0–8.3)

## 2023-11-28 NOTE — Progress Notes (Signed)
   Subjective:    Patient ID: Julie Ross, female    DOB: 1961-02-26, 62 y.o.   MRN: [id number]  HPI CPE- pap is scheduled.  UTD on mammo.  UTD on colonoscopy, Tdap, shingrix  Patient Care Team    Relationship Specialty Notifications Start End  Jess Morita, MD PCP - General Family Medicine  03/07/23   Hazle Lites, MD PCP - Cardiology Cardiology  06/29/21   Rayvon Cambric, NP Nurse Practitioner Obstetrics and Gynecology  12/17/14   Kenney Peacemaker, MD Consulting Physician Gastroenterology  12/17/14   Wendolyn Hamburger, MD Consulting Physician Orthopedic Surgery  06/27/17   Devon Fogo, MD (Inactive) Consulting Physician Dermatology  04/07/20   Jorie Newness, MD  Neurology  07/14/20     Health Maintenance  Topic Date Due   HIV Screening  Never done   Pneumonia Vaccine 23+ Years old (2 of 2 - PCV) 06/21/2011   COVID-19 Vaccine (4 - 2024-25 season) 02/19/2023   Cervical Cancer Screening (HPV/Pap Cotest)  12/02/2023   Medicare Annual Wellness (AWV)  12/28/2023   MAMMOGRAM  11/30/2023   INFLUENZA VACCINE  01/19/2024   Colonoscopy  11/12/2031   DTaP/Tdap/Td (4 - Td or Tdap) 03/06/2033   DEXA SCAN  Completed   Hepatitis C Screening  Completed   Zoster Vaccines- Shingrix  Completed   HPV VACCINES  Aged Out   Meningococcal B Vaccine  Aged Out     Review of Systems Patient reports no vision/ hearing changes, adenopathy,fever, weight change,  persistant/recurrent hoarseness , swallowing issues, chest pain, palpitations, edema, persistant/recurrent cough, hemoptysis, dyspnea (rest/exertional/paroxysmal nocturnal), gastrointestinal bleeding (melena, rectal bleeding), abdominal pain, significant heartburn, bowel changes, GU symptoms (dysuria, hematuria, incontinence), Gyn symptoms (abnormal  bleeding, pain),  syncope, focal weakness, memory loss, numbness & tingling, skin/hair/nail changes, abnormal bruising or bleeding, anxiety, or depression.     Objective:   Physical Exam General  Appearance:    Alert, cooperative, no distress, appears stated age  Head:    Normocephalic, without obvious abnormality, atraumatic  Eyes:    PERRL, conjunctiva/corneas clear, EOM's intact both eyes  Ears:    Normal TM's and external ear canals, both ears  Nose:   Nares normal, septum midline, mucosa normal, no drainage    or sinus tenderness  Throat:   Lips, mucosa, and tongue normal; teeth and gums normal  Neck:   Supple, symmetrical, trachea midline, no adenopathy;    Thyroid : no enlargement/tenderness/nodules  Back:     Symmetric, no curvature, ROM normal, no CVA tenderness  Lungs:     Clear to auscultation bilaterally, respirations unlabored  Chest Wall:    No tenderness or deformity   Heart:    Regular rate and rhythm, S1 and S2 normal, no murmur, rub   or gallop  Breast Exam:    Deferred to GYN  Abdomen:     Soft, non-tender, bowel sounds active all four quadrants,    no masses, no organomegaly  Genitalia:    Deferred to GYN  Rectal:    Extremities:   Extremities normal, atraumatic, no cyanosis or edema  Pulses:   2+ and symmetric all extremities  Skin:   Skin color, texture, turgor normal, no rashes or lesions  Lymph nodes:   Cervical, supraclavicular, and axillary nodes normal  Neurologic:   CNII-XII intact, normal strength, sensation and reflexes    throughout          Assessment & Plan:

## 2023-11-28 NOTE — Patient Instructions (Addendum)
 Follow up in 6 months to recheck BP and cholesterol We'll notify you of your lab results and make any changes if needed We'll call you to schedule your PM&R appt (physical medicine and rehabilitation) Continue to work on healthy diet and regular exercise- you can do it! Call with any questions or concerns Stay Safe!  Stay Healthy! Enjoy the new baby!!!

## 2023-11-28 NOTE — Assessment & Plan Note (Signed)
 Pt's PE WNL w/ exception of BMI.  Pap and mammo scheduled.  UTD on colonoscopy.  UTD on Tdap, shingles- will get PNA at pharmacy.  Check labs.  Anticipatory guidance provided.

## 2023-11-30 ENCOUNTER — Encounter: Payer: Self-pay | Admitting: Family Medicine

## 2023-11-30 ENCOUNTER — Other Ambulatory Visit: Payer: Self-pay | Admitting: Family Medicine

## 2023-11-30 LAB — TSH: TSH: 2.71 u[IU]/mL (ref 0.35–5.50)

## 2023-11-30 LAB — VITAMIN D 25 HYDROXY (VIT D DEFICIENCY, FRACTURES): VITD: 30.54 ng/mL (ref 30.00–100.00)

## 2023-11-30 LAB — HIV ANTIBODY (ROUTINE TESTING W REFLEX): HIV 1&2 Ab, 4th Generation: NONREACTIVE

## 2023-12-01 ENCOUNTER — Ambulatory Visit: Payer: Self-pay | Admitting: Family Medicine

## 2023-12-18 DIAGNOSIS — H35341 Macular cyst, hole, or pseudohole, right eye: Secondary | ICD-10-CM | POA: Diagnosis not present

## 2023-12-18 DIAGNOSIS — H524 Presbyopia: Secondary | ICD-10-CM | POA: Diagnosis not present

## 2023-12-18 DIAGNOSIS — H2513 Age-related nuclear cataract, bilateral: Secondary | ICD-10-CM | POA: Diagnosis not present

## 2023-12-18 DIAGNOSIS — H04123 Dry eye syndrome of bilateral lacrimal glands: Secondary | ICD-10-CM | POA: Diagnosis not present

## 2023-12-18 DIAGNOSIS — H43813 Vitreous degeneration, bilateral: Secondary | ICD-10-CM | POA: Diagnosis not present

## 2023-12-20 ENCOUNTER — Encounter: Payer: Self-pay | Admitting: Physical Medicine and Rehabilitation

## 2023-12-26 DIAGNOSIS — Z1231 Encounter for screening mammogram for malignant neoplasm of breast: Secondary | ICD-10-CM | POA: Diagnosis not present

## 2023-12-27 DIAGNOSIS — M25562 Pain in left knee: Secondary | ICD-10-CM | POA: Diagnosis not present

## 2023-12-27 DIAGNOSIS — M25561 Pain in right knee: Secondary | ICD-10-CM | POA: Diagnosis not present

## 2023-12-27 DIAGNOSIS — M79672 Pain in left foot: Secondary | ICD-10-CM | POA: Diagnosis not present

## 2023-12-28 ENCOUNTER — Other Ambulatory Visit (HOSPITAL_COMMUNITY): Payer: Self-pay | Admitting: Orthopedic Surgery

## 2023-12-28 DIAGNOSIS — M25562 Pain in left knee: Secondary | ICD-10-CM

## 2024-01-09 ENCOUNTER — Encounter (HOSPITAL_COMMUNITY)
Admission: RE | Admit: 2024-01-09 | Discharge: 2024-01-09 | Disposition: A | Discharge status: Home / Self Care | Source: Ambulatory Visit | Attending: Orthopedic Surgery | Admitting: Orthopedic Surgery

## 2024-01-09 ENCOUNTER — Ambulatory Visit (HOSPITAL_COMMUNITY)
Admission: RE | Admit: 2024-01-09 | Discharge: 2024-01-09 | Disposition: A | Discharge status: Home / Self Care | Source: Ambulatory Visit | Attending: Orthopedic Surgery | Admitting: Orthopedic Surgery

## 2024-01-09 DIAGNOSIS — M25562 Pain in left knee: Secondary | ICD-10-CM | POA: Diagnosis not present

## 2024-01-09 DIAGNOSIS — Z96653 Presence of artificial knee joint, bilateral: Secondary | ICD-10-CM | POA: Diagnosis not present

## 2024-01-09 DIAGNOSIS — Z853 Personal history of malignant neoplasm of breast: Secondary | ICD-10-CM | POA: Diagnosis not present

## 2024-01-09 MED ORDER — TECHNETIUM TC 99M MEDRONATE IV KIT
22.0000 | PACK | Freq: Once | INTRAVENOUS | Status: AC | PRN
  Administered 2024-01-09: 22 via INTRAVENOUS

## 2024-01-10 ENCOUNTER — Ambulatory Visit: Payer: Medicare Other | Admitting: Neurology

## 2024-01-10 ENCOUNTER — Encounter: Payer: Self-pay | Admitting: Neurology

## 2024-01-10 VITALS — BP 144/68 | HR 61 | Ht 66.0 in | Wt 199.5 lb

## 2024-01-10 DIAGNOSIS — G4733 Obstructive sleep apnea (adult) (pediatric): Secondary | ICD-10-CM

## 2024-01-10 DIAGNOSIS — G4721 Circadian rhythm sleep disorder, delayed sleep phase type: Secondary | ICD-10-CM | POA: Diagnosis not present

## 2024-01-10 DIAGNOSIS — F0781 Postconcussional syndrome: Secondary | ICD-10-CM | POA: Diagnosis not present

## 2024-01-10 DIAGNOSIS — Z853 Personal history of malignant neoplasm of breast: Secondary | ICD-10-CM

## 2024-01-10 DIAGNOSIS — R5383 Other fatigue: Secondary | ICD-10-CM

## 2024-01-10 DIAGNOSIS — D329 Benign neoplasm of meninges, unspecified: Secondary | ICD-10-CM

## 2024-01-10 DIAGNOSIS — G35 Multiple sclerosis: Secondary | ICD-10-CM

## 2024-01-10 NOTE — Progress Notes (Signed)
 [facility]  PATIENT: Julie Ross DOB: March 27, 1961  REFERRING DOCTOR OR PCP:  Comer Greet SOURCE: patient and records from [facility]  _________________________________   HISTORICAL  CHIEF COMPLAINT:  Chief Complaint  Patient presents with   Follow-up    Pt in room 11. Alone.Here for MS follow up. Pt reports being fatigue and bilateral leg achy, no fall since last year. Eye exam was June 30th.      HISTORY OF PRESENT ILLNESS:  Julie Ross is a 63 y.o. woman with multiple sclerosis and breast cancer.    Update 01/10/2024: She was at PT  for vestibular therapy when she felt sick and BP decreased from 140 systolic to 98.  She was taken to the ED.   EKG showed sinus bradycardia.    CXR was fine.    Headaches and vertigo are doing better (no longer gets N with HA, vertigo now every other day).   Vertigo is positional usually after standing up from a sated position or walking.   She fell on a wet surface 03/07/2023 and slammed her head hitting the right lateral forehead.  She had a large bruise and laceration.  She had several seconds of LOC.   Right away, she had a headache and nausea.  In ED, CT scan showed no intracranial acute findings but showed right facial swelling.  Maxillofacial bones were ok.        The brain MRI from 09/21/2023 showed that the meningiomas were stable.  There is a diffuse appearing focus in the left frontal lobe that looks unchanged compared to 2024.  This is nonspecific and could be due to her known MS though other etiology needs to also be considered such as low-grade glioma and she reports that a follow-up MRI has been scheduled for 6 months.    She saw Dr. Vazlow and will see again in October 2025 with repeat MRI a week before.    Her multiple sclerosis seems stable.  She has been off a DMT (she had been on Chemo for breast cancer in 2013/2014 and stopped around that time.   .  She has no recent exacerbations.  She continues to  note some progression with reduced gait and balance.  She does not note much weakness or dysesthesias.  Bladder is doing well.  MS lesions on the MRI of the brain 10/01/2020 was unchanged compared to the MRI 06/16/2020 and 04/13/2018 MRI is also showed meningiomas that have increased in size.   She also has meningiomas and sees Dr. Vazlow.  She had stereotactic radiosurgery 10/06/2020 for the meningiomas (left sphenoid wing and left tentorial)   She sees Dr. Charmain again in October  Hr gait and balance are stable compared to last year.    She has stumbles but not falls.    She has trouble with stairs and uses the bannister.  She denies numbness.    Bladder function is unchanged.      She has noted more fatigue.   Sleep onset is poor even with clonazepam . .  She often takes a short nap after getting up at 830.  She then lays down on the couch and dozes off again.    Due to more fatigue.  We did a PSG.  It showed minimal OSA with an AHI of 2.5.  However, she had moderate REM OSA but very little REM sleep was recorded.  Insomnia is better with clonazepam  and this helps her sleep through the night better than  Xanax  did.     Many years ago, Provigil caused her to feel jittery and phentermine  caused stomach upset n the past.   She prefers not to retry.   She likely has delayed phase sleep disorder  She had breast cancer in 2013.   She was seeing Dr. Gudena.   She has no recurrence and is off all medications for it now.   She had neurosurgery at [facility] (had synovial cyst and spondylolisthesis; Dr. Cheryle).   Back is doing better.   Polysomnogram 11/21/2022showed: Negligible overall Obstructive Sleep Apnea with AHI = 2.5.  However, there was moderate REM related OSA with a REM AHI = 22. No significant PLMS Delayed sleep latency.  Normal sleep efficiency. All stages of sleep were recorded.   MS History:    She was diagnosed with MS in 1999 after presenting with optic neuritis.   She had a spinal tap, MRI and  angiogram.   Initially, the diagnosis was uncertain but she had changes in the MRI over time leading to a diagnosis of MS a few years later.    Initially, she was placed on Copaxone, then switched to Avonex due to skin reactions.   She then switched to Tysabri when she had some mild breakthrough disease. She tolerated Tysabri very well and her MS did well.  However, she was JCV antibody positive so she stopped in 2012.   She did not restart Avonex as she did not want to go back to an injection.    Her last exacerbation was optic neuritis in 2012 treated with steroids. She proved but did not get back to baseline.  This exacerbation occurred off any DMT.  She tried Gilenya in 2013. She had trouble tolerating Gilenya and discontinued shortly after starting it. She was going to start Tecfidera but was diagnosed with breast cancer in 2013 and opted not to start at that time. She received chemotherapy including Taxotere , carboplatin , Herceptin  in 2013 and 2014. An MRI of the brain in 2015 showed only mild MS progression compared to her previous one in 2011.   She stopped disease modifying therapies in 2014 and 2015.  Subsequent MRIs have been stable.  IMAGING: MRI of the brain 04/13/2018 showed multiple T2/FLAIR hyperintense foci in the brainstem, cerebellum and hemispheres in a pattern and configuration consistent with demyelinating plaque associated with multiple sclerosis.  One focus in the right splenium was not present on the 2017 MRI and shows some enhancement without edema consistent with a subacute demyelinating plaque.  Other foci were present on the 2017 MRI.     Two meningeal based foci (left sphenoid wing and left tentorium) as described above consistent with meningiomas.  They are described as the same size on the 2018 MRI report.   Complex stable pineal cyst  MRI of the brain 06/16/2020 showed stable MS lesions.  These meningiomas had increased in size compared to 2019  MRI of the brain 10/01/2020  showed no further changes compared to 06/16/2020  MRI of the brain 03/23/2023 showed:   1. No progression of left sphenoid wing and tentorial meningiomas, as described above.    2. A 5 mm meningioma along the superior left frontal convexity, in  retrospect stable. 3. Chronic white matter disease is stable. There are indistinct areas of more subtle and infiltrating T2 hyperintensity at the left frontal lobe, nonenhancing. Consider shorter term follow-up for this finding.   MRI of the brain 09/21/2023 showed no change  REVIEW OF SYSTEMS: Constitutional: No fevers,  chills, sweats, or change in appetite.  Notes fatigue Eyes: No visual changes, double vision, eye pain Ear, nose and throat: No hearing loss, ear pain, nasal congestion, sore throat.  Recent URI Cardiovascular: No chest pain, palpitations Respiratory:  No shortness of breath at rest or with exertion.   No wheezes.  Has OSA GastrointestinaI: No nausea, vomiting, diarrhea, abdominal pain, fecal incontinence Genitourinary:  No dysuria, urinary retention or frequency.  She has 1 times nocturia most nights Musculoskeletal:  No neck pain, back pain.  She has bilateral knee pain Integumentary: No rash, pruritus, skin lesions Neurological: as above Psychiatric: No depression at this time.  Some anxiety Endocrine: No palpitations, diaphoresis, change in appetite, change in weigh or increased thirst Hematologic/Lymphatic:  No anemia, purpura, petechiae. Allergic/Immunologic: No itchy/runny eyes, nasal congestion, recent allergic reactions, rashes  ALLERGIES: Allergies  Allergen Reactions   Bee Venom Anaphylaxis   Fentanyl  Other (See Comments)    Very low BP, decrease LOC-received Narcan -in 2020    HOME MEDICATIONS:  Current Outpatient Medications:    Ascorbic Acid (VITAMIN C PO), Take by mouth., Disp: , Rfl:    aspirin  EC 81 MG tablet, Take 1 tablet (81 mg total) by mouth daily. Swallow whole., Disp: 90 tablet, Rfl: 3   atorvastatin   (LIPITOR) 20 MG tablet, TAKE 1 TABLET BY MOUTH ONCE  DAILY, Disp: 100 tablet, Rfl: 2   baclofen  (LIORESAL ) 10 MG tablet, TAKE 1 TABLET BY MOUTH 3 TIMES  DAILY AS NEEDED FOR MUSCLE  SPASM(S), Disp: 270 tablet, Rfl: 3   buPROPion  (WELLBUTRIN  XL) 150 MG 24 hr tablet, TAKE 1 TABLET BY MOUTH DAILY, Disp: 90 tablet, Rfl: 3   clonazePAM  (KLONOPIN ) 1 MG tablet, TAKE 1 TABLET BY MOUTH AT  BEDTIME, Disp: 90 tablet, Rfl: 1   EPINEPHrine  0.3 mg/0.3 mL IJ SOAJ injection, Inject 0.3 mg into the muscle as needed for anaphylaxis., Disp: 1 each, Rfl: 3   gabapentin  (NEURONTIN ) 600 MG tablet, TAKE 1 TABLET BY MOUTH 3 TIMES  DAILY, Disp: 300 tablet, Rfl: 2   levothyroxine  (SYNTHROID ) 88 MCG tablet, TAKE 1 TABLET BY MOUTH DAILY  BEFORE BREAKFAST, Disp: 100 tablet, Rfl: 2   losartan -hydrochlorothiazide  (HYZAAR ) 100-25 MG tablet, TAKE 1 TABLET BY MOUTH EVERY  MORNING BEFORE BREAKFAST, Disp: 100 tablet, Rfl: 2   meclizine  (ANTIVERT ) 25 MG tablet, Take 2 tablets (50 mg total) by mouth 3 (three) times daily as needed for dizziness., Disp: 60 tablet, Rfl: 2   omeprazole  (PRILOSEC) 40 MG capsule, TAKE 1 CAPSULE BY MOUTH DAILY, Disp: 100 capsule, Rfl: 2   ondansetron  (ZOFRAN ) 4 MG tablet, Take 1 tablet (4 mg total) by mouth every 8 (eight) hours as needed for nausea or vomiting., Disp: 30 tablet, Rfl: 1   potassium chloride  SA (KLOR-CON  M) 20 MEQ tablet, TAKE 1 TABLET BY MOUTH DAILY, Disp: 100 tablet, Rfl: 2   sertraline  (ZOLOFT ) 100 MG tablet, TAKE 1 TABLET BY MOUTH DAILY, Disp: 100 tablet, Rfl: 3  PAST MEDICAL HISTORY: Past Medical History:  Diagnosis Date   Anemia    h/o - ? bld. transfusion - more recent- ?2013   Anxiety    uses xanax  mainly for sleep   Arthritis    knees    Basal cell carcinoma 01/11/2005   rim left nostril-,mohs   BCC (basal cell carcinoma) 01/11/2005   right cheek mid-mohs   BCC (basal cell carcinoma) 09/12/2006   right forehead -mohs   BCC (basal cell carcinoma) 01/19/2016   right upper  arm -[id number]  BCC (basal cell carcinoma) 02/18/2019   forehead-mohs   Breast cancer (HCC) 11/2011   lul/ER+PR+, x1 lymph node    Cancer (HCC)    squamous cell skin cancer x7   Complication of anesthesia    Depression    GERD (gastroesophageal reflux disease)    H/O bone density study 03/2011   H/O colonoscopy    H/O echocardiogram    last one 02/2013, due to chemotherapy   Heart burn    Hx of radiation therapy 04/17/12- 06/04/12   left chest wall, axilla, L supraclavicular fossa 4500 cGy, left mastectomy scar/chest wall 5940 cGy   Hypertension    Hypothyroidism    IBS (irritable bowel syndrome)    Meningioma (HCC) 08/2020   Multiple sclerosis (HCC)    Neuromuscular disorder (HCC)    MS TX WITH CYMBALTA     Personal history of chemotherapy    Personal history of radiation therapy    SCC (squamous cell carcinoma) 01/11/2005   chest mid   Sleep apnea    no CPAP   Squamous cell carcinoma of skin 01/11/2005   over left lip   Wears glasses     PAST SURGICAL HISTORY: Past Surgical History:  Procedure Laterality Date   APPLICATION OF ROBOTIC ASSISTANCE FOR SPINAL PROCEDURE N/A 03/12/2019   Procedure: APPLICATION OF ROBOTIC ASSISTANCE FOR SPINAL PROCEDURE;  Surgeon: Cheryle Debby LABOR, MD;  Location: [facility] OR;  Service: Neurosurgery;  Laterality: N/A;   BREAST REDUCTION SURGERY Right 01/25/2021   Procedure: MAMMARY REDUCTION  (BREAST);  Surgeon: Arelia Filippo, MD;  Location: [facility];  Service: Plastics;  Laterality: Right;   CESAREAN SECTION     X2    COLONOSCOPY     ESOPHAGOGASTRODUODENOSCOPY     FLEXIBLE SIGMOIDOSCOPY     I & D KNEE WITH POLY EXCHANGE  03/02/2012   Procedure: IRRIGATION AND DEBRIDEMENT KNEE WITH POLY EXCHANGE;  Surgeon: Norleen LITTIE Gavel, MD;  Location: [facility] OR;  Service: Orthopedics;  Laterality: Left;  I&D of total knee with Possible Poly Exchange    JOINT REPLACEMENT  07/22/2011   left knee- multiple surgeries    KNEE ARTHROSCOPY     LT KNEE  04/2011   KNEE ARTHROSCOPY  06/20/1982   rt   MASTECTOMY     MASTECTOMY W/ SENTINEL NODE BIOPSY  12/19/2011   Procedure: MASTECTOMY WITH SENTINEL LYMPH NODE BIOPSY;  Surgeon: Sherlean JINNY Laughter, MD;  Location: [facility];  Service: General;  Laterality: Left;  left breast and left axilla    MOHS SURGERY     right face   PORT-A-CATH REMOVAL  03/06/2012   Procedure: REMOVAL PORT-A-CATH;  Surgeon: Krystal JINNY Russell, MD;  Location: [facility] OR;  Service: General;  Laterality: N/A;   PORT-A-CATH REMOVAL Right 08/22/2012   Procedure: REMOVAL PORT-A-CATH;  Surgeon: Morene ONEIDA Olives, MD;  Location: [facility];  Service: General;  Laterality: Right;   PORTACATH PLACEMENT  01/16/2012   Procedure: INSERTION PORT-A-CATH;  Surgeon: Sherlean JINNY Laughter, MD;  Location: [facility];  Service: General;  Laterality: Right;  Porta Cath Placement    PORTACATH PLACEMENT  05/01/2012   Procedure: INSERTION PORT-A-CATH;  Surgeon: Sherlean JINNY Laughter, MD;  Location: [facility];  Service: General;  Laterality: Right;  right internal jugular port-a-cath insertion   TEE WITHOUT CARDIOVERSION  03/06/2012   Procedure: TRANSESOPHAGEAL ECHOCARDIOGRAM (TEE);  Surgeon: Maude JAYSON Emmer, MD;  Location: [facility];  Service: Cardiovascular;  Laterality: N/A;  Rm. [id number]   TONSILLECTOMY  TOTAL KNEE ARTHROPLASTY  08/15/2011   Procedure: TOTAL KNEE ARTHROPLASTY; lft Surgeon: Norleen LITTIE Gavel, MD;  Location: [facility];  Service: Orthopedics;  Laterality: Left;  RIGHT KNEE CORTIZONE INJECTION   TOTAL KNEE ARTHROPLASTY Left 08/18/2012   Procedure:  Irrigation and debridement of LEFT total knee;  Removal of total knee parts; Implant of spacers;  Surgeon: Dempsey JINNY Sensor, MD;  Location: [facility] OR;  Service: Orthopedics;  Laterality: Left;   TOTAL KNEE ARTHROPLASTY Right 08/12/2013   TOTAL KNEE ARTHROPLASTY Right 08/12/2013   Procedure: RIGHT TOTAL KNEE ARTHROPLASTY;  Surgeon: Dempsey JINNY Sensor, MD;  Location: [facility];  Service:  Orthopedics;  Laterality: Right;   TOTAL KNEE REVISION Left 04/19/2013   Procedure: TOTAL KNEE REVISION AND REMOVAL CEMENT SPACER;  Surgeon: Dempsey JINNY Sensor, MD;  Location: [facility];  Service: Orthopedics;  Laterality: Left;   TRANSFORAMINAL LUMBAR INTERBODY FUSION (TLIF) WITH PEDICLE SCREW FIXATION 1 LEVEL Bilateral 03/12/2019   Procedure: Lumbar four-five Open transforaminal lumbar interbody fusion with Lumbar four-five laminectomy, bilateral Lumbar four-five Pedicle screw placement;  Surgeon: Cheryle Debby LABOR, MD;  Location: [facility];  Service: Neurosurgery;  Laterality: Bilateral;   TUMOR REMOVAL     FROM PELVIS AGE 35    FAMILY HISTORY: Family History  Problem Relation Age of Onset   Arthritis Mother    Hypertension Mother    Heart disease Mother        CHF   Dementia Father    Diabetes type II Father    Heart disease Maternal Grandfather    Breast cancer Paternal Grandmother        dx in her 39s   Lung cancer Maternal Uncle        smoker   Breast cancer Paternal Aunt        dx in her 55s   Ovarian cancer Paternal Aunt        dx in her 30s   Colon cancer Neg Hx    Stomach cancer Neg Hx    Esophageal cancer Neg Hx     SOCIAL HISTORY:  Social History   Socioeconomic History   Marital status: Married    Spouse name: Not on file   Number of children: 2   Years of education: Not on file   Highest education level: Bachelor's degree (e.g., BA, AB, BS)  Occupational History    Employer: [employer]    Occupation: disabled  Tobacco Use   Smoking status: Never   Smokeless tobacco: Never  Vaping Use   Vaping status: Never Used  Substance and Sexual Activity   Alcohol use: No   Drug use: No   Sexual activity: Yes    Birth control/protection: Post-menopausal  Other Topics Concern   Not on file  Social History Narrative   Right Handed   1 cup of Tea per Day   Social Drivers of Health   Financial Resource Strain: Low Risk  (11/21/2023)   Overall Financial Resource  Strain (CARDIA)    Difficulty of Paying Living Expenses: Not hard at all  Food Insecurity: No Food Insecurity (11/21/2023)   Hunger Vital Sign    Worried About Running Out of Food in the Last Year: Never true    Ran Out of Food in the Last Year: Never true  Transportation Needs: No Transportation Needs (11/21/2023)   PRAPARE - Administrator, Civil Service (Medical): No    Lack of Transportation (Non-Medical): No  Physical Activity: Inactive (11/21/2023)   Exercise Vital Sign  Days of Exercise per Week: 0 days    Minutes of Exercise per Session: 30 min  Stress: Stress Concern Present (11/21/2023)   [employer] of Occupational Health - Occupational Stress Questionnaire    Feeling of Stress : To some extent  Social Connections: Moderately Integrated (11/21/2023)   Social Connection and Isolation Panel    Frequency of Communication with Friends and Family: More than three times a week    Frequency of Social Gatherings with Friends and Family: More than three times a week    Attends Religious Services: Never    Database administrator or Organizations: No    Attends Engineer, structural: More than 4 times per year    Marital Status: Married  Catering manager Violence: Not At Risk (12/28/2022)   Humiliation, Afraid, Rape, and Kick questionnaire    Fear of Current or Ex-Partner: No    Emotionally Abused: No    Physically Abused: No    Sexually Abused: No     PHYSICAL EXAM  Vitals:   01/10/24 1455  BP: (!) 144/68  Pulse: 61  Weight: 199 lb 8 oz (90.5 kg)  Height: 5' 6 (1.676 m)    Body mass index is 32.2 kg/m.   General: The patient is well-developed and well-nourished and in no acute distress.  She has mild swelling but no current bruising at the right lateral forehead.  There is mild tenderness.  Funduscopic examination showed normal optic discs and retinal vessels.  Neurologic Exam  Mental status: The patient is alert and oriented x 3 at the time of  the examination. The patient has apparent normal recent and remote memory, with an apparently normal attention span and concentration ability.   Speech is normal.  Cranial nerves: Extraocular movements are full.  Facial strength and sensation is normal. The tongue is midline, and the patient has symmetric elevation of the soft palate. No obvious hearing deficits are noted.  Motor:  Muscle bulk is normal.   Muscle tone is normal.  Strength appears to be normal in the legs except right EHL (4/5)  Sensory: Intact sensation to touch and vibration in legs.    Coordination: Finger-nose-finger is performed well...  Gait and station: Station is normal.   The gait is mildly wide.  Tandem gait is wide.   The Romberg is negative.  Reflexes: Deep tendon reflexes are symmetric and normal bilaterally in arms.  DTRs are 1 at the knees.  Deep tendon reflexes absent at the ankles. ____________________________________________________  ASSESSMENT AND PLAN  No diagnosis found.   1.   Her MS is stable.  She can continue off of a disease modifying therapy.  It is likely that she  has had benefit from having chemotherapy for her breast cancer.  Additionally, most people with MS in the mid 60s are able to stop therapy. 2.   Post-concussive syndrome is better. She no longer needs PT 3.   Continue  clonazepam  for insomnia.      Imipramine  may also help some 4.    She is followed for her meningiomas by neuro-oncology.  There is also a concerning T2/Flair hyperintense focus on the left that is not typical for MS but has been present since at least 2022.  It was similar but smaller in 2019  --- she has another MRI scheduled and f/u wth Dr. Vazlow in October 2025 5.  She will return to see us  in 6 months or sooner if she has new or worsening neurologic  symptoms.    This visit is part of a comprehensive longitudinal care medical relationship regarding the patients primary diagnosis of multiple sclerosis and related  concerns.   Zailyn Thoennes A. Vear, MD, PhD 01/10/2024, 3:16 PM Certified in Neurology, Clinical Neurophysiology, Sleep Medicine, Pain Medicine and Neuroimaging  [facility] [address], [address] [place], [place] [address] [phone]

## 2024-01-16 DIAGNOSIS — M25562 Pain in left knee: Secondary | ICD-10-CM | POA: Diagnosis not present

## 2024-01-17 DIAGNOSIS — Z96652 Presence of left artificial knee joint: Secondary | ICD-10-CM | POA: Diagnosis not present

## 2024-02-05 ENCOUNTER — Encounter: Admitting: Physical Medicine and Rehabilitation

## 2024-03-10 ENCOUNTER — Encounter: Payer: Self-pay | Admitting: Neurology

## 2024-03-10 ENCOUNTER — Other Ambulatory Visit: Payer: Self-pay | Admitting: Neurology

## 2024-03-11 ENCOUNTER — Other Ambulatory Visit: Payer: Self-pay

## 2024-03-11 MED ORDER — CLONAZEPAM 1 MG PO TABS: 1.0000 mg | ORAL_TABLET | Freq: Every day | ORAL | 1 refills | Status: AC

## 2024-03-11 NOTE — Telephone Encounter (Signed)
 Dr.Dohmeier you are work in provider, Dr.Sater is out of the office  Last seen on 01/10/24 Follow up scheduled on 08/14/24    Dispensed Days Supply Quantity Provider Pharmacy  CLONAZEPAM   1 MG TABS 12/24/2023 90 90 tablet Sater, Charlie LABOR, MD [id number]     Rx pending to be signed

## 2024-03-11 NOTE — Telephone Encounter (Signed)
 Pt Last Seen 01/10/2024 Upcoming Appointment 08/14/2024  [email or web address]  Last filled 12/24/2023  Mail Order

## 2024-03-12 ENCOUNTER — Other Ambulatory Visit: Payer: Self-pay | Admitting: Family Medicine

## 2024-03-21 ENCOUNTER — Ambulatory Visit (HOSPITAL_COMMUNITY)
Admission: RE | Admit: 2024-03-21 | Discharge: 2024-03-21 | Disposition: A | Discharge status: Home / Self Care | Source: Ambulatory Visit | Attending: Internal Medicine | Admitting: Internal Medicine

## 2024-03-21 DIAGNOSIS — R9089 Other abnormal findings on diagnostic imaging of central nervous system: Secondary | ICD-10-CM | POA: Insufficient documentation

## 2024-03-21 DIAGNOSIS — R9082 White matter disease, unspecified: Secondary | ICD-10-CM | POA: Diagnosis not present

## 2024-03-21 DIAGNOSIS — D320 Benign neoplasm of cerebral meninges: Secondary | ICD-10-CM | POA: Diagnosis not present

## 2024-03-21 DIAGNOSIS — R42 Dizziness and giddiness: Secondary | ICD-10-CM | POA: Diagnosis not present

## 2024-03-21 DIAGNOSIS — D329 Benign neoplasm of meninges, unspecified: Secondary | ICD-10-CM | POA: Insufficient documentation

## 2024-03-21 MED ORDER — GADOBUTROL 1 MMOL/ML IV SOLN
9.0000 mL | Freq: Once | INTRAVENOUS | Status: AC | PRN
  Administered 2024-03-21: 9 mL via INTRAVENOUS

## 2024-03-25 ENCOUNTER — Encounter

## 2024-03-25 ENCOUNTER — Inpatient Hospital Stay: Attending: Internal Medicine | Admitting: Internal Medicine

## 2024-03-25 VITALS — BP 116/56 | HR 63 | Temp 97.2°F | Resp 16 | Ht 66.0 in | Wt 197.0 lb

## 2024-03-25 DIAGNOSIS — Z801 Family history of malignant neoplasm of trachea, bronchus and lung: Secondary | ICD-10-CM | POA: Insufficient documentation

## 2024-03-25 DIAGNOSIS — D329 Benign neoplasm of meninges, unspecified: Secondary | ICD-10-CM

## 2024-03-25 DIAGNOSIS — Z923 Personal history of irradiation: Secondary | ICD-10-CM | POA: Diagnosis not present

## 2024-03-25 DIAGNOSIS — D320 Benign neoplasm of cerebral meninges: Secondary | ICD-10-CM | POA: Diagnosis not present

## 2024-03-25 DIAGNOSIS — C320 Malignant neoplasm of glottis: Secondary | ICD-10-CM | POA: Diagnosis present

## 2024-03-25 DIAGNOSIS — Z803 Family history of malignant neoplasm of breast: Secondary | ICD-10-CM | POA: Diagnosis not present

## 2024-03-25 DIAGNOSIS — Z853 Personal history of malignant neoplasm of breast: Secondary | ICD-10-CM | POA: Diagnosis not present

## 2024-03-25 DIAGNOSIS — Z9012 Acquired absence of left breast and nipple: Secondary | ICD-10-CM | POA: Diagnosis not present

## 2024-03-25 DIAGNOSIS — Z85828 Personal history of other malignant neoplasm of skin: Secondary | ICD-10-CM | POA: Diagnosis not present

## 2024-03-25 DIAGNOSIS — G35D Multiple sclerosis, unspecified: Secondary | ICD-10-CM | POA: Diagnosis not present

## 2024-03-25 NOTE — Progress Notes (Signed)
 [facility] at [facility] [address]  [place], [place] [address] [phone]   Interval Evaluation  Date of Service: 03/25/24 Patient Name: Julie Ross Patient MRN: [id number] Patient DOB: 1960/12/24 Provider: Arthea MARLA Manns, MD  Identifying Statement:  Julie Ross is a 63 y.o. female with multifocal meningioma    Oncologic History: Oncology History  Breast cancer of upper-outer quadrant of right female breast (HCC)  11/22/2011 Initial Diagnosis   Cancer of upper-outer quadrant of female breast   12/19/2011 Surgery   Left mastectomy with SLN biopsy 2 foci of invasive ductal carcinoma 0.8 and 0.6 cm low-grade ER PR positive HER-2 positive ratio 3.27 Ki-67 11% one lymph node positive out of 4 (T1, N1, M0 stage II)   02/16/2012 - 04/14/2013 Chemotherapy   Taxotere , carboplatin , Herceptin  x2 cycles complicated by bacteremia and left knee sepsis requiring revision of the knee. Herceptin  maintenance every 3 weeks. Herceptin  maintenance completed 04/14/2013   04/17/2012 - 06/04/2012 Radiation Therapy   Radiation therapy to the breast   02/14/2013 Genetic Testing   Negative genetic testing on the Breast/Ovarian Cancer panel.  The Breast/Ovarian gene panel offered by [facility] includes sequencing and rearrangement analysis for the following 21 genes:  ATM, BARD1, BRCA1, BRCA2, BRIP1, CDH1, CHEK2, EPCAM, FANCC, MLH1, MSH2, MSH6, NBN, PALB2, PMS2, PTEN, STK11, RAD51C, RAD51D, TP53, and XRCC2.   The report date is February 14, 2013.   03/13/2013 -  Anti-estrogen oral therapy   Letrozole  2.5 mg once daily     CNS Oncologic History 10/15/20: SRS to two meningomas, L sphenoid wing and L tentorium Valene)  Interval History: [name] presents today for follow up after recent MRI brain.  Denies new or progressive neurologic symptoms today.  She has been busy taking care of new grandson.  No seizures, or headaches.  Continues to follow with Dr. Vear.   (H+P)  Patient presents today for follow up after completing post-SRS MRI study.  She describes no new or progressive neurologic deficits.  No facial weakness, numbness, or double vision.  Continues to follow with Dr. Vear for history of MS.  Functional status is independent, doesn't require assistance with ADLs.  Medications: Current Outpatient Medications on File Prior to Visit  Medication Sig Dispense Refill   Ascorbic Acid (VITAMIN C PO) Take by mouth.     aspirin  EC 81 MG tablet Take 1 tablet (81 mg total) by mouth daily. Swallow whole. 90 tablet 3   atorvastatin  (LIPITOR) 20 MG tablet TAKE 1 TABLET BY MOUTH ONCE  DAILY 100 tablet 2   baclofen  (LIORESAL ) 10 MG tablet TAKE 1 TABLET BY MOUTH 3 TIMES  DAILY AS NEEDED FOR MUSCLE  SPASM(S) 270 tablet 3   buPROPion  (WELLBUTRIN  XL) 150 MG 24 hr tablet TAKE 1 TABLET BY MOUTH DAILY 90 tablet 3   clonazePAM  (KLONOPIN ) 1 MG tablet Take 1 tablet (1 mg total) by mouth at bedtime. 90 tablet 1   EPINEPHrine  0.3 mg/0.3 mL IJ SOAJ injection Inject 0.3 mg into the muscle as needed for anaphylaxis. 1 each 3   gabapentin  (NEURONTIN ) 600 MG tablet TAKE 1 TABLET BY MOUTH 3 TIMES  DAILY 300 tablet 2   levothyroxine  (SYNTHROID ) 88 MCG tablet TAKE 1 TABLET BY MOUTH DAILY  BEFORE BREAKFAST 100 tablet 2   losartan -hydrochlorothiazide  (HYZAAR ) 100-25 MG tablet TAKE 1 TABLET BY MOUTH EVERY  MORNING BEFORE BREAKFAST 100 tablet 2   omeprazole  (PRILOSEC) 40 MG capsule TAKE 1 CAPSULE BY MOUTH DAILY  100 capsule 2   ondansetron  (ZOFRAN ) 4 MG tablet Take 1 tablet (4 mg total) by mouth every 8 (eight) hours as needed for nausea or vomiting. 30 tablet 1   potassium chloride  SA (KLOR-CON  M) 20 MEQ tablet TAKE 1 TABLET BY MOUTH DAILY 100 tablet 2   sertraline  (ZOLOFT ) 100 MG tablet TAKE 1 TABLET BY MOUTH DAILY 100 tablet 3   No current facility-administered medications on file prior to visit.    Allergies:  Allergies  Allergen Reactions   Bee Venom Anaphylaxis   Fire Ant  Anaphylaxis   Fentanyl  Other (See Comments)    Very low BP, decrease LOC-received Narcan -in 2020   Past Medical History:  Past Medical History:  Diagnosis Date   Anemia    h/o - ? bld. transfusion - more recent- ?2013   Anxiety    uses xanax  mainly for sleep   Arthritis    knees    Basal cell carcinoma 01/11/2005   rim left nostril-,mohs   BCC (basal cell carcinoma) 01/11/2005   right cheek mid-mohs   BCC (basal cell carcinoma) 09/12/2006   right forehead -mohs   BCC (basal cell carcinoma) 01/19/2016   right upper arm -[id number]   BCC (basal cell carcinoma) 02/18/2019   forehead-mohs   Breast cancer (HCC) 11/2011   lul/ER+PR+, x1 lymph node    Cancer (HCC)    squamous cell skin cancer x7   Complication of anesthesia    Depression    GERD (gastroesophageal reflux disease)    H/O bone density study 03/2011   H/O colonoscopy    H/O echocardiogram    last one 02/2013, due to chemotherapy   Heart burn    Hx of radiation therapy 04/17/12- 06/04/12   left chest wall, axilla, L supraclavicular fossa 4500 cGy, left mastectomy scar/chest wall 5940 cGy   Hypertension    Hypothyroidism    IBS (irritable bowel syndrome)    Meningioma (HCC) 08/2020   Multiple sclerosis    Neuromuscular disorder (HCC)    MS TX WITH CYMBALTA     Personal history of chemotherapy    Personal history of radiation therapy    SCC (squamous cell carcinoma) 01/11/2005   chest mid   Sleep apnea    no CPAP   Squamous cell carcinoma of skin 01/11/2005   over left lip   Wears glasses    Past Surgical History:  Past Surgical History:  Procedure Laterality Date   APPLICATION OF ROBOTIC ASSISTANCE FOR SPINAL PROCEDURE N/A 03/12/2019   Procedure: APPLICATION OF ROBOTIC ASSISTANCE FOR SPINAL PROCEDURE;  Surgeon: Cheryle Debby LABOR, MD;  Location: [facility] OR;  Service: Neurosurgery;  Laterality: N/A;   BREAST REDUCTION SURGERY Right 01/25/2021   Procedure: MAMMARY REDUCTION  (BREAST);  Surgeon: Arelia Filippo, MD;   Location: [facility];  Service: Plastics;  Laterality: Right;   CESAREAN SECTION     X2    COLONOSCOPY     ESOPHAGOGASTRODUODENOSCOPY     FLEXIBLE SIGMOIDOSCOPY     I & D KNEE WITH POLY EXCHANGE  03/02/2012   Procedure: IRRIGATION AND DEBRIDEMENT KNEE WITH POLY EXCHANGE;  Surgeon: Norleen LITTIE Gavel, MD;  Location: [facility] OR;  Service: Orthopedics;  Laterality: Left;  I&D of total knee with Possible Poly Exchange    JOINT REPLACEMENT  07/22/2011   left knee- multiple surgeries    KNEE ARTHROSCOPY     LT KNEE 04/2011   KNEE ARTHROSCOPY  06/20/1982   rt   MASTECTOMY  MASTECTOMY W/ SENTINEL NODE BIOPSY  12/19/2011   Procedure: MASTECTOMY WITH SENTINEL LYMPH NODE BIOPSY;  Surgeon: Sherlean JINNY Laughter, MD;  Location: [facility] OR;  Service: General;  Laterality: Left;  left breast and left axilla    MOHS SURGERY     right face   PORT-A-CATH REMOVAL  03/06/2012   Procedure: REMOVAL PORT-A-CATH;  Surgeon: Krystal JINNY Russell, MD;  Location: [facility] OR;  Service: General;  Laterality: N/A;   PORT-A-CATH REMOVAL Right 08/22/2012   Procedure: REMOVAL PORT-A-CATH;  Surgeon: Morene ONEIDA Olives, MD;  Location: [facility];  Service: General;  Laterality: Right;   PORTACATH PLACEMENT  01/16/2012   Procedure: INSERTION PORT-A-CATH;  Surgeon: Sherlean JINNY Laughter, MD;  Location: [facility];  Service: General;  Laterality: Right;  Porta Cath Placement    PORTACATH PLACEMENT  05/01/2012   Procedure: INSERTION PORT-A-CATH;  Surgeon: Sherlean JINNY Laughter, MD;  Location: [facility];  Service: General;  Laterality: Right;  right internal jugular port-a-cath insertion   TEE WITHOUT CARDIOVERSION  03/06/2012   Procedure: TRANSESOPHAGEAL ECHOCARDIOGRAM (TEE);  Surgeon: Maude JAYSON Emmer, MD;  Location: [facility] ENDOSCOPY;  Service: Cardiovascular;  Laterality: N/A;  Rm. [id number]   TONSILLECTOMY     TOTAL KNEE ARTHROPLASTY  08/15/2011   Procedure: TOTAL KNEE ARTHROPLASTY; lft Surgeon: Norleen LITTIE Gavel, MD;   Location: [facility];  Service: Orthopedics;  Laterality: Left;  RIGHT KNEE CORTIZONE INJECTION   TOTAL KNEE ARTHROPLASTY Left 08/18/2012   Procedure:  Irrigation and debridement of LEFT total knee;  Removal of total knee parts; Implant of spacers;  Surgeon: Dempsey JINNY Sensor, MD;  Location: [facility] OR;  Service: Orthopedics;  Laterality: Left;   TOTAL KNEE ARTHROPLASTY Right 08/12/2013   TOTAL KNEE ARTHROPLASTY Right 08/12/2013   Procedure: RIGHT TOTAL KNEE ARTHROPLASTY;  Surgeon: Dempsey JINNY Sensor, MD;  Location: [facility];  Service: Orthopedics;  Laterality: Right;   TOTAL KNEE REVISION Left 04/19/2013   Procedure: TOTAL KNEE REVISION AND REMOVAL CEMENT SPACER;  Surgeon: Dempsey JINNY Sensor, MD;  Location: [facility];  Service: Orthopedics;  Laterality: Left;   TRANSFORAMINAL LUMBAR INTERBODY FUSION (TLIF) WITH PEDICLE SCREW FIXATION 1 LEVEL Bilateral 03/12/2019   Procedure: Lumbar four-five Open transforaminal lumbar interbody fusion with Lumbar four-five laminectomy, bilateral Lumbar four-five Pedicle screw placement;  Surgeon: Cheryle Debby LABOR, MD;  Location: [facility] OR;  Service: Neurosurgery;  Laterality: Bilateral;   TUMOR REMOVAL     FROM PELVIS AGE 6   Social History:  Social History   Socioeconomic History   Marital status: Married    Spouse name: Not on file   Number of children: 2   Years of education: Not on file   Highest education level: Bachelor's degree (e.g., BA, AB, BS)  Occupational History    Employer: [employer]    Occupation: disabled  Tobacco Use   Smoking status: Never   Smokeless tobacco: Never  Vaping Use   Vaping status: Never Used  Substance and Sexual Activity   Alcohol use: No   Drug use: No   Sexual activity: Yes    Birth control/protection: Post-menopausal  Other Topics Concern   Not on file  Social History Narrative   Right Handed   1 cup of Tea per Day   Social Drivers of Health   Financial Resource Strain: Low Risk  (11/21/2023)   Overall Financial Resource Strain  (CARDIA)    Difficulty of Paying Living Expenses: Not hard at all  Food Insecurity: No Food Insecurity (11/21/2023)  Hunger Vital Sign    Worried About Running Out of Food in the Last Year: Never true    Ran Out of Food in the Last Year: Never true  Transportation Needs: No Transportation Needs (11/21/2023)   PRAPARE - Administrator, Civil Service (Medical): No    Lack of Transportation (Non-Medical): No  Physical Activity: Inactive (11/21/2023)   Exercise Vital Sign    Days of Exercise per Week: 0 days    Minutes of Exercise per Session: 30 min  Stress: Stress Concern Present (11/21/2023)   [employer] of Occupational Health - Occupational Stress Questionnaire    Feeling of Stress : To some extent  Social Connections: Moderately Integrated (11/21/2023)   Social Connection and Isolation Panel    Frequency of Communication with Friends and Family: More than three times a week    Frequency of Social Gatherings with Friends and Family: More than three times a week    Attends Religious Services: Never    Database administrator or Organizations: No    Attends Engineer, structural: More than 4 times per year    Marital Status: Married  Catering manager Violence: Not At Risk (12/28/2022)   Humiliation, Afraid, Rape, and Kick questionnaire    Fear of Current or Ex-Partner: No    Emotionally Abused: No    Physically Abused: No    Sexually Abused: No   Family History:  Family History  Problem Relation Age of Onset   Arthritis Mother    Hypertension Mother    Heart disease Mother        CHF   Dementia Father    Diabetes type II Father    Heart disease Maternal Grandfather    Breast cancer Paternal Grandmother        dx in her 31s   Lung cancer Maternal Uncle        smoker   Breast cancer Paternal Aunt        dx in her 74s   Ovarian cancer Paternal Aunt        dx in her 30s   Colon cancer Neg Hx    Stomach cancer Neg Hx    Esophageal cancer Neg Hx      Review of Systems: Constitutional: Doesn't report fevers, chills or abnormal weight loss Eyes: Doesn't report blurriness of vision Ears, nose, mouth, throat, and face: Doesn't report sore throat Respiratory: Doesn't report cough, dyspnea or wheezes Cardiovascular: Doesn't report palpitation, chest discomfort  Gastrointestinal:  Doesn't report nausea, constipation, diarrhea GU: Doesn't report incontinence Skin: Doesn't report skin rashes Neurological: Per HPI Musculoskeletal: Doesn't report joint pain Behavioral/Psych: Doesn't report anxiety  Physical Exam: Vitals:   03/25/24 1102  BP: (!) 116/56  Pulse: 63  Resp: 16  Temp: (!) 97.2 F (36.2 C)  SpO2: 97%     KPS: 90. General: Alert, cooperative, pleasant, in no acute distress Head: Normal EENT: No conjunctival injection or scleral icterus.  Lungs: Resp effort normal Cardiac: Regular rate Abdomen: Non-distended abdomen Skin: No rashes cyanosis or petechiae. Extremities: No clubbing or edema  Neurologic Exam: Mental Status: Awake, alert, attentive to examiner. Oriented to self and environment. Language is fluent with intact comprehension.  Cranial Nerves: Visual acuity is grossly normal. Visual fields are full. Extra-ocular movements intact. No ptosis. Face is symmetric Motor: Tone and bulk are normal. Power is full in both arms and legs. Reflexes are symmetric, no pathologic reflexes present.  Sensory: Intact to light touch Gait: Normal.  Labs: I have reviewed the data as listed    Component Value Date/Time   NA 131 (L) 11/28/2023 1033   NA 134 04/06/2021 0957   NA 138 08/18/2014 1551   K 3.5 11/28/2023 1033   K 3.7 08/18/2014 1551   CL 96 11/28/2023 1033   CL 100 11/30/2012 1029   CO2 27 11/28/2023 1033   CO2 27 08/18/2014 1551   GLUCOSE 109 (H) 11/28/2023 1033   GLUCOSE 117 08/18/2014 1551   GLUCOSE 91 11/30/2012 1029   BUN 17 11/28/2023 1033   BUN 17 04/06/2021 0957   BUN 17.2 08/18/2014 1551    CREATININE 1.08 11/28/2023 1033   CREATININE 1.36 (H) 09/30/2020 1412   CREATININE 1.29 (H) 06/26/2017 1603   CREATININE 1.3 (H) 08/18/2014 1551   CALCIUM  9.1 11/28/2023 1033   CALCIUM  9.1 08/18/2014 1551   PROT 6.8 11/28/2023 1033   PROT 7.3 05/31/2018 1545   PROT 7.1 08/18/2014 1551   ALBUMIN  4.4 11/28/2023 1033   ALBUMIN  4.6 05/31/2018 1545   ALBUMIN  4.0 08/18/2014 1551   AST 15 11/28/2023 1033   AST 19 08/18/2014 1551   ALT 9 11/28/2023 1033   ALT 21 08/18/2014 1551   ALKPHOS 108 11/28/2023 1033   ALKPHOS 137 08/18/2014 1551   BILITOT 0.8 11/28/2023 1033   BILITOT 0.6 05/31/2018 1545   BILITOT 0.46 08/18/2014 1551   GFRNONAA 55 (L) 11/02/2023 1330   GFRNONAA 44 (L) 09/30/2020 1412   GFRAA 57 (L) 10/09/2019 1458   Lab Results  Component Value Date   WBC 5.1 11/28/2023   NEUTROABS 2.8 11/28/2023   HGB 11.3 (L) 11/28/2023   HCT 33.7 (L) 11/28/2023   MCV 73.0 (L) 11/28/2023   PLT 210.0 11/28/2023    Imaging: [facility] Clinician Interpretation: I have personally reviewed the CNS images as listed.  My interpretation, in the context of the patient's clinical presentation, is stable disease   MR BRAIN W WO CONTRAST Result Date: 03/21/2024 CLINICAL DATA:  Provided history: Vertigo. Abnormal findings on MRI brain. Meningioma. Brain/CNS neoplasm, assess treatment response. EXAM: MRI HEAD WITHOUT AND WITH CONTRAST TECHNIQUE: Multiplanar, multiecho pulse sequences of the brain and surrounding structures were obtained without and with intravenous contrast. CONTRAST:  9mL GADAVIST  GADOBUTROL  1 MMOL/ML IV SOLN COMPARISON:  Prior brain MRI examinations 09/21/2023 and earlier. FINDINGS: Brain: No age-advanced or lobar predominant cerebral atrophy. 7 mm enhancing dural-based mass projecting medially from the left tentorium, just above the cisternal trigeminal nerve, unchanged (series 16, image 51). 16 mm enhancing dural-based mass along the left sphenoid wing, extending to the left paraclinoid  region, unchanged (series 16, image 62). As before, there is mild regional mass effect without adjacent parenchymal edema. 5 mm dural-based mass along the high left frontal lobe convexity, unchanged (series 16, image 39) (series 17, image 20). Unchanged foci of vague T2 FLAIR hyperintense cortical and white matter signal abnormality within the left frontoparietal operculum (see annotations on series 9). Scattered foci of T2 FLAIR hyperintense signal abnormality elsewhere within the cerebral white matter are non-progressed. 7 mm pineal cyst. There is no acute infarct. No chronic intracranial blood products. No extra-axial fluid collection. No midline shift. Vascular: Maintained flow voids within the proximal large arterial vessels. Skull and upper cervical spine: No focal worrisome marrow lesion. Sinuses/Orbits: No mass or acute finding within the imaged orbits. No significant paranasal sinus disease. IMPRESSION: 1. No significant change as compared to the MRI of 09/21/2023. 2. Stable size of meningiomas along the left sphenoid wing,  left tentorium and overlying the left frontal lobe convexity. 3. Unchanged foci of vague cortical and white matter signal abnormality within the left frontoparietal operculum (without associated pathologic enhancement). Although nonspecific, a non-enhancing tumor (such as a low-grade glioma) remains a consideration. Continued MRI surveillance recommended. 4. Stable background cerebral white matter disease. Electronically Signed   By: Rockey Childs D.O.   On: 03/21/2024 13:56     Assessment/Plan Meningioma (HCC)  Kenly M Dumont is clinically stable today.  MRI brain demonstrates stable treated meningiomas.  Left frontal and parietal white matter changes, possibly c/w gliomatosis, are unchanged over 1 year.  Will defer biopsy or intervention and recommend continued imaging surveillance only.    We ask that VIRGIA KELNER return to clinic in 9 months following next brain MRI, or  sooner as needed.  All questions were answered. The patient knows to call the clinic with any problems, questions or concerns. No barriers to learning were detected.  The total time spent in the encounter was 30 minutes and more than 50% was on counseling and review of test results   Arthea MARLA Manns, MD Medical Director of Neuro-Oncology [facility] 03/25/24 11:12 AM

## 2024-03-26 ENCOUNTER — Other Ambulatory Visit: Payer: Self-pay | Admitting: Radiation Therapy

## 2024-05-20 ENCOUNTER — Other Ambulatory Visit: Payer: Self-pay | Admitting: Neurology

## 2024-05-20 DIAGNOSIS — M170 Bilateral primary osteoarthritis of knee: Secondary | ICD-10-CM

## 2024-05-20 DIAGNOSIS — Z8669 Personal history of other diseases of the nervous system and sense organs: Secondary | ICD-10-CM

## 2024-05-29 ENCOUNTER — Ambulatory Visit: Admitting: Family Medicine

## 2024-05-29 ENCOUNTER — Encounter: Payer: Self-pay | Admitting: Family Medicine

## 2024-05-29 VITALS — BP 126/72 | HR 58 | Temp 98.0°F | Ht 66.0 in | Wt 197.0 lb

## 2024-05-29 DIAGNOSIS — E785 Hyperlipidemia, unspecified: Secondary | ICD-10-CM

## 2024-05-29 DIAGNOSIS — E039 Hypothyroidism, unspecified: Secondary | ICD-10-CM | POA: Diagnosis not present

## 2024-05-29 DIAGNOSIS — E669 Obesity, unspecified: Secondary | ICD-10-CM

## 2024-05-29 DIAGNOSIS — I10 Essential (primary) hypertension: Secondary | ICD-10-CM

## 2024-05-29 LAB — CBC WITH DIFFERENTIAL/PLATELET
Basophils Absolute: 0.1 K/uL (ref 0.0–0.1)
Basophils Relative: 1.2 % (ref 0.0–3.0)
Eosinophils Absolute: 0.2 K/uL (ref 0.0–0.7)
Eosinophils Relative: 2.9 % (ref 0.0–5.0)
HCT: 36.4 % (ref 36.0–46.0)
Hemoglobin: 11.9 g/dL — ABNORMAL LOW (ref 12.0–15.0)
Lymphocytes Relative: 32.6 % (ref 12.0–46.0)
Lymphs Abs: 1.8 K/uL (ref 0.7–4.0)
MCHC: 32.6 g/dL (ref 30.0–36.0)
MCV: 72.9 fl — ABNORMAL LOW (ref 78.0–100.0)
Monocytes Absolute: 0.4 K/uL (ref 0.1–1.0)
Monocytes Relative: 6.6 % (ref 3.0–12.0)
Neutro Abs: 3.1 K/uL (ref 1.4–7.7)
Neutrophils Relative %: 56.7 % (ref 43.0–77.0)
Platelets: 221 K/uL (ref 150.0–400.0)
RBC: 4.99 Mil/uL (ref 3.87–5.11)
RDW: 16.2 % — ABNORMAL HIGH (ref 11.5–15.5)
WBC: 5.5 K/uL (ref 4.0–10.5)

## 2024-05-29 LAB — BASIC METABOLIC PANEL WITH GFR
BUN: 13 mg/dL (ref 6–23)
CO2: 27 meq/L (ref 19–32)
Calcium: 9.2 mg/dL (ref 8.4–10.5)
Chloride: 95 meq/L — ABNORMAL LOW (ref 96–112)
Creatinine, Ser: 1.06 mg/dL (ref 0.40–1.20)
GFR: 55.03 mL/min — ABNORMAL LOW (ref >60.00)
Glucose, Bld: 93 mg/dL (ref 70–99)
Potassium: 3.3 meq/L — ABNORMAL LOW (ref 3.5–5.1)
Sodium: 132 meq/L — ABNORMAL LOW (ref 135–145)

## 2024-05-29 LAB — LIPID PANEL
Cholesterol: 149 mg/dL (ref 0–200)
HDL: 80.1 mg/dL (ref >39.00)
LDL Cholesterol: 54 mg/dL (ref 0–99)
NonHDL: 69.31
Total CHOL/HDL Ratio: 2
Triglycerides: 76 mg/dL (ref 0.0–149.0)
VLDL: 15.2 mg/dL (ref 0.0–40.0)

## 2024-05-29 LAB — HEPATIC FUNCTION PANEL
ALT: 12 U/L (ref 0–35)
AST: 17 U/L (ref 0–37)
Albumin: 4.5 g/dL (ref 3.5–5.2)
Alkaline Phosphatase: 121 U/L — ABNORMAL HIGH (ref 39–117)
Bilirubin, Direct: 0.1 mg/dL (ref 0.0–0.3)
Total Bilirubin: 0.8 mg/dL (ref 0.2–1.2)
Total Protein: 7.2 g/dL (ref 6.0–8.3)

## 2024-05-29 LAB — TSH: TSH: 3.19 u[IU]/mL (ref 0.35–5.50)

## 2024-05-29 NOTE — Progress Notes (Unsigned)
° °  Subjective:    Patient ID: Julie Ross, female    DOB: 1960/07/31, 63 y.o.   MRN: [id number]  HPI HTN- chronic problem, on Losartan  hydrochlorothiazide  100/25mg  daily w/ good control.  No CP, SOB, HA's, visual changes, edema.  Hyperlipidemia- chronic problem, on Atorvastatin  20mg  daily.  No abd pain, N/V.  Hypothyroid- chronic problem, on Levothyroxine  88mcg daily.  + fatigue.  + dry skin.  No changes to hair/nails.  Obesity- pt was going to [employer] but Medicare stopped paying for it.  Has had some balance issues since her fall which has limited her exercise.  Does still walk some.   Review of Systems For ROS see HPI     Objective:   Physical Exam Vitals reviewed.  Constitutional:      General: She is not in acute distress.    Appearance: Normal appearance. She is well-developed. She is not ill-appearing.  HENT:     Head: Normocephalic and atraumatic.  Eyes:     Conjunctiva/sclera: Conjunctivae normal.     Pupils: Pupils are equal, round, and reactive to light.  Neck:     Thyroid : No thyromegaly.  Cardiovascular:     Rate and Rhythm: Normal rate and regular rhythm.     Pulses: Normal pulses.     Heart sounds: Normal heart sounds. No murmur heard. Pulmonary:     Effort: Pulmonary effort is normal. No respiratory distress.     Breath sounds: Normal breath sounds.  Abdominal:     General: There is no distension.     Palpations: Abdomen is soft.     Tenderness: There is no abdominal tenderness.  Musculoskeletal:     Cervical back: Normal range of motion and neck supple.     Right lower leg: No edema.     Left lower leg: No edema.  Lymphadenopathy:     Cervical: No cervical adenopathy.  Skin:    General: Skin is warm and dry.  Neurological:     General: No focal deficit present.     Mental Status: She is alert and oriented to person, place, and time.  Psychiatric:        Mood and Affect: Mood normal.        Behavior: Behavior normal.        Thought Content:  Thought content normal.           Assessment & Plan:

## 2024-05-29 NOTE — Patient Instructions (Addendum)
 Schedule your complete physical in 6 months We'll notify you of your lab results and make any changes if needed Keep up the good work on healthy diet and regular exercise- you look great! You can get your RSV shot at any time at the pharmacy Call with any questions or concerns Stay Safe!  Stay Healthy! Happy Holidays!!!

## 2024-05-30 ENCOUNTER — Ambulatory Visit: Payer: Self-pay | Admitting: Family Medicine

## 2024-06-09 NOTE — Assessment & Plan Note (Signed)
 Chronic problem.  On Losartan  HCZT daily w/ good control.  Currently asymptomatic.  Check labs due to ARB and diuretic use but no anticipated med changes.  Will follow.

## 2024-06-09 NOTE — Assessment & Plan Note (Signed)
 Chronic problem.  Currently on Levothyroxine  88mcg daily.  + fatigue, dry skin.  Check labs.  Adjust meds prn

## 2024-06-09 NOTE — Assessment & Plan Note (Signed)
 Chronic problem, on Atorvastatin  20mg  daily w/o difficulty.  Check labs.  Adjust meds prn

## 2024-06-09 NOTE — Assessment & Plan Note (Signed)
 Ongoing issue for pt.  She is no longer going to the gym as Medicare stopped paying for her membership.  She continues to have balance issues since her fall which is limiting her exercise but she does continue to walk.  Encouraged low carb diet.  Will follow.

## 2024-06-12 ENCOUNTER — Other Ambulatory Visit: Payer: Self-pay | Admitting: Family Medicine

## 2024-06-12 DIAGNOSIS — E785 Hyperlipidemia, unspecified: Secondary | ICD-10-CM

## 2024-06-12 DIAGNOSIS — I10 Essential (primary) hypertension: Secondary | ICD-10-CM

## 2024-06-26 ENCOUNTER — Ambulatory Visit

## 2024-07-13 ENCOUNTER — Encounter: Payer: Self-pay | Admitting: Family Medicine

## 2024-07-13 DIAGNOSIS — R110 Nausea: Secondary | ICD-10-CM

## 2024-07-15 NOTE — Telephone Encounter (Signed)
 Called patient to see if she would like a virtual appt. She declined. Patient does not know why she has nausea. She would like something called in if able. Advised that Dr. Mahlon is out of office today. She understood and still would like a message sent to her.

## 2024-07-16 MED ORDER — ONDANSETRON HCL 4 MG PO TABS: 4.0000 mg | ORAL_TABLET | Freq: Three times a day (TID) | ORAL | 1 refills | Status: AC | PRN

## 2024-08-14 ENCOUNTER — Ambulatory Visit: Admitting: Neurology

## 2024-11-27 ENCOUNTER — Ambulatory Visit: Admitting: Family Medicine

## 2024-12-23 ENCOUNTER — Encounter
